# Patient Record
Sex: Female | Born: 1950
Health system: Southern US, Community
[De-identification: ages and names within clinical notes are randomized; demographics above are authoritative.]

## PROBLEM LIST (undated history)

## (undated) DIAGNOSIS — E662 Morbid (severe) obesity with alveolar hypoventilation: Secondary | ICD-10-CM

## (undated) DIAGNOSIS — M19049 Primary osteoarthritis, unspecified hand: Secondary | ICD-10-CM

## (undated) DIAGNOSIS — I4892 Unspecified atrial flutter: Secondary | ICD-10-CM

## (undated) DIAGNOSIS — Z5989 Other problems related to housing and economic circumstances: Secondary | ICD-10-CM

## (undated) DIAGNOSIS — E785 Hyperlipidemia, unspecified: Secondary | ICD-10-CM

## (undated) DIAGNOSIS — I5032 Chronic diastolic (congestive) heart failure: Secondary | ICD-10-CM

## (undated) DIAGNOSIS — Z5987 Material hardship due to limited financial resources, not elsewhere classified: Secondary | ICD-10-CM

## (undated) DIAGNOSIS — N950 Postmenopausal bleeding: Secondary | ICD-10-CM

## (undated) DIAGNOSIS — R519 Headache, unspecified: Secondary | ICD-10-CM

## (undated) DIAGNOSIS — E669 Obesity, unspecified: Secondary | ICD-10-CM

## (undated) DIAGNOSIS — Z598 Other problems related to housing and economic circumstances: Secondary | ICD-10-CM

## (undated) DIAGNOSIS — K219 Gastro-esophageal reflux disease without esophagitis: Secondary | ICD-10-CM

## (undated) DIAGNOSIS — R001 Bradycardia, unspecified: Secondary | ICD-10-CM

## (undated) DIAGNOSIS — K589 Irritable bowel syndrome without diarrhea: Secondary | ICD-10-CM

## (undated) DIAGNOSIS — IMO0001 Reserved for inherently not codable concepts without codable children: Secondary | ICD-10-CM

## (undated) DIAGNOSIS — I10 Essential (primary) hypertension: Secondary | ICD-10-CM

## (undated) DIAGNOSIS — R195 Other fecal abnormalities: Secondary | ICD-10-CM

## (undated) DIAGNOSIS — N184 Chronic kidney disease, stage 4 (severe): Secondary | ICD-10-CM

## (undated) DIAGNOSIS — R51 Headache: Secondary | ICD-10-CM

## (undated) HISTORY — PX: EYE SURGERY: SHX253

## (undated) HISTORY — PX: CHOLECYSTECTOMY: SHX55

## (undated) HISTORY — DX: Postmenopausal bleeding: N95.0

## (undated) HISTORY — DX: Other problems related to housing and economic circumstances: Z59.89

## (undated) HISTORY — PX: OTHER SURGICAL HISTORY: SHX169

## (undated) HISTORY — DX: Morbid (severe) obesity due to excess calories: E66.01

## (undated) HISTORY — DX: Bradycardia, unspecified: R00.1

## (undated) HISTORY — DX: Irritable bowel syndrome, unspecified: K58.9

## (undated) HISTORY — DX: Obesity, unspecified: E66.9

## (undated) HISTORY — DX: Other fecal abnormalities: R19.5

## (undated) HISTORY — DX: Chronic kidney disease, stage 4 (severe): N18.4

## (undated) HISTORY — DX: Hyperlipidemia, unspecified: E78.5

## (undated) HISTORY — DX: Material hardship due to limited financial resources, not elsewhere classified: Z59.87

## (undated) HISTORY — DX: Chronic diastolic (congestive) heart failure: I50.32

## (undated) HISTORY — DX: Other problems related to housing and economic circumstances: Z59.8

## (undated) HISTORY — DX: Morbid (severe) obesity with alveolar hypoventilation: E66.2

## (undated) HISTORY — DX: Primary osteoarthritis, unspecified hand: M19.049

## (undated) HISTORY — DX: Unspecified atrial flutter: I48.92

---

## 2007-11-05 ENCOUNTER — Inpatient Hospital Stay (HOSPITAL_COMMUNITY): Admission: EM | Admit: 2007-11-05 | Discharge: 2007-11-11 | Payer: Self-pay | Admitting: Family Medicine

## 2007-11-05 ENCOUNTER — Ambulatory Visit: Payer: Self-pay | Admitting: Internal Medicine

## 2007-11-05 DIAGNOSIS — A4902 Methicillin resistant Staphylococcus aureus infection, unspecified site: Secondary | ICD-10-CM

## 2007-11-06 DIAGNOSIS — E785 Hyperlipidemia, unspecified: Secondary | ICD-10-CM

## 2007-11-10 DIAGNOSIS — L0390 Cellulitis, unspecified: Secondary | ICD-10-CM

## 2007-11-10 DIAGNOSIS — E1169 Type 2 diabetes mellitus with other specified complication: Secondary | ICD-10-CM

## 2007-11-10 DIAGNOSIS — L0291 Cutaneous abscess, unspecified: Secondary | ICD-10-CM | POA: Insufficient documentation

## 2007-11-16 ENCOUNTER — Telehealth: Payer: Self-pay | Admitting: *Deleted

## 2007-11-28 ENCOUNTER — Encounter (INDEPENDENT_AMBULATORY_CARE_PROVIDER_SITE_OTHER): Payer: Self-pay | Admitting: *Deleted

## 2007-11-28 ENCOUNTER — Ambulatory Visit: Payer: Self-pay | Admitting: Infectious Diseases

## 2007-11-28 LAB — CONVERTED CEMR LAB
BUN: 10 mg/dL (ref 6–23)
Blood Glucose, Fingerstick: 356
CO2: 30 meq/L (ref 19–32)
Calcium: 9.4 mg/dL (ref 8.4–10.5)
Chloride: 103 meq/L (ref 96–112)
Creatinine, Ser: 0.62 mg/dL (ref 0.40–1.20)
Glucose, Bld: 251 mg/dL — ABNORMAL HIGH (ref 70–99)
Hgb A1c MFr Bld: 10.5 %
Potassium: 4.6 meq/L (ref 3.5–5.3)
Sodium: 137 meq/L (ref 135–145)

## 2007-12-05 ENCOUNTER — Encounter (INDEPENDENT_AMBULATORY_CARE_PROVIDER_SITE_OTHER): Payer: Self-pay | Admitting: *Deleted

## 2007-12-05 ENCOUNTER — Ambulatory Visit: Payer: Self-pay | Admitting: Internal Medicine

## 2007-12-05 LAB — CONVERTED CEMR LAB
Blood Glucose, Fingerstick: 160
Blood Glucose, Home Monitor: 3 mg/dL

## 2007-12-16 ENCOUNTER — Telehealth (INDEPENDENT_AMBULATORY_CARE_PROVIDER_SITE_OTHER): Payer: Self-pay | Admitting: Internal Medicine

## 2007-12-28 ENCOUNTER — Encounter (INDEPENDENT_AMBULATORY_CARE_PROVIDER_SITE_OTHER): Payer: Self-pay | Admitting: *Deleted

## 2007-12-28 ENCOUNTER — Encounter: Admission: RE | Admit: 2007-12-28 | Discharge: 2007-12-28 | Payer: Self-pay | Admitting: Internal Medicine

## 2008-01-04 ENCOUNTER — Encounter (INDEPENDENT_AMBULATORY_CARE_PROVIDER_SITE_OTHER): Payer: Self-pay | Admitting: *Deleted

## 2008-01-18 ENCOUNTER — Ambulatory Visit: Payer: Self-pay | Admitting: Internal Medicine

## 2008-01-18 ENCOUNTER — Encounter (INDEPENDENT_AMBULATORY_CARE_PROVIDER_SITE_OTHER): Payer: Self-pay | Admitting: *Deleted

## 2008-01-27 ENCOUNTER — Ambulatory Visit: Payer: Self-pay | Admitting: Internal Medicine

## 2008-01-27 ENCOUNTER — Encounter (INDEPENDENT_AMBULATORY_CARE_PROVIDER_SITE_OTHER): Payer: Self-pay | Admitting: *Deleted

## 2008-01-27 DIAGNOSIS — N950 Postmenopausal bleeding: Secondary | ICD-10-CM

## 2008-01-27 LAB — CONVERTED CEMR LAB
ALT: 13 units/L (ref 0–35)
AST: 13 units/L (ref 0–37)
Albumin: 3.6 g/dL (ref 3.5–5.2)
Alkaline Phosphatase: 74 units/L (ref 39–117)
BUN: 13 mg/dL (ref 6–23)
Blood Glucose, Fingerstick: 133
CO2: 21 meq/L (ref 19–32)
Calcium: 9 mg/dL (ref 8.4–10.5)
Chloride: 107 meq/L (ref 96–112)
Cholesterol: 132 mg/dL (ref 0–200)
Creatinine, Ser: 0.73 mg/dL (ref 0.40–1.20)
Glucose, Bld: 118 mg/dL — ABNORMAL HIGH (ref 70–99)
HDL: 41 mg/dL (ref >39)
Hgb A1c MFr Bld: 7.2 %
LDL Cholesterol: 78 mg/dL (ref 0–99)
Potassium: 4.3 meq/L (ref 3.5–5.3)
Sodium: 139 meq/L (ref 135–145)
Total Bilirubin: 0.5 mg/dL (ref 0.3–1.2)
Total CHOL/HDL Ratio: 3.2
Total Protein: 6.7 g/dL (ref 6.0–8.3)
Triglycerides: 67 mg/dL (ref <150)
VLDL: 13 mg/dL (ref 0–40)

## 2008-01-31 ENCOUNTER — Encounter (INDEPENDENT_AMBULATORY_CARE_PROVIDER_SITE_OTHER): Payer: Self-pay | Admitting: *Deleted

## 2008-03-01 ENCOUNTER — Telehealth (INDEPENDENT_AMBULATORY_CARE_PROVIDER_SITE_OTHER): Payer: Self-pay | Admitting: *Deleted

## 2008-03-08 ENCOUNTER — Ambulatory Visit: Payer: Self-pay | Admitting: *Deleted

## 2008-03-08 ENCOUNTER — Telehealth (INDEPENDENT_AMBULATORY_CARE_PROVIDER_SITE_OTHER): Payer: Self-pay | Admitting: *Deleted

## 2008-03-08 LAB — CONVERTED CEMR LAB

## 2008-03-16 ENCOUNTER — Ambulatory Visit: Payer: Self-pay | Admitting: Obstetrics & Gynecology

## 2008-03-16 ENCOUNTER — Other Ambulatory Visit: Admission: RE | Admit: 2008-03-16 | Discharge: 2008-03-16 | Payer: Self-pay | Admitting: Obstetrics & Gynecology

## 2008-04-02 ENCOUNTER — Encounter: Admission: RE | Admit: 2008-04-02 | Discharge: 2008-04-02 | Payer: Self-pay | Admitting: Obstetrics & Gynecology

## 2008-05-07 ENCOUNTER — Encounter (INDEPENDENT_AMBULATORY_CARE_PROVIDER_SITE_OTHER): Payer: Self-pay | Admitting: *Deleted

## 2008-05-07 ENCOUNTER — Ambulatory Visit: Payer: Self-pay | Admitting: Internal Medicine

## 2008-05-07 LAB — CONVERTED CEMR LAB: Blood Glucose, Fingerstick: 107

## 2008-05-09 ENCOUNTER — Encounter (INDEPENDENT_AMBULATORY_CARE_PROVIDER_SITE_OTHER): Payer: Self-pay | Admitting: *Deleted

## 2008-05-11 ENCOUNTER — Ambulatory Visit: Payer: Self-pay | Admitting: Obstetrics and Gynecology

## 2008-05-16 ENCOUNTER — Ambulatory Visit (HOSPITAL_COMMUNITY): Admission: RE | Admit: 2008-05-16 | Discharge: 2008-05-16 | Payer: Self-pay | Admitting: Internal Medicine

## 2008-05-16 ENCOUNTER — Encounter (INDEPENDENT_AMBULATORY_CARE_PROVIDER_SITE_OTHER): Payer: Self-pay | Admitting: *Deleted

## 2008-05-16 ENCOUNTER — Ambulatory Visit: Payer: Self-pay | Admitting: Internal Medicine

## 2008-05-16 LAB — CONVERTED CEMR LAB
Blood Glucose, Fingerstick: 139
Hgb A1c MFr Bld: 5.8 %

## 2008-08-15 ENCOUNTER — Telehealth: Payer: Self-pay | Admitting: *Deleted

## 2008-08-31 ENCOUNTER — Ambulatory Visit: Payer: Self-pay | Admitting: Obstetrics & Gynecology

## 2008-08-31 ENCOUNTER — Encounter: Payer: Self-pay | Admitting: Obstetrics & Gynecology

## 2008-09-19 ENCOUNTER — Encounter (INDEPENDENT_AMBULATORY_CARE_PROVIDER_SITE_OTHER): Payer: Self-pay | Admitting: *Deleted

## 2008-09-19 LAB — HM DIABETES EYE EXAM

## 2008-10-26 ENCOUNTER — Telehealth: Payer: Self-pay | Admitting: Infectious Diseases

## 2008-10-30 ENCOUNTER — Telehealth (INDEPENDENT_AMBULATORY_CARE_PROVIDER_SITE_OTHER): Payer: Self-pay | Admitting: *Deleted

## 2008-11-13 ENCOUNTER — Ambulatory Visit: Payer: Self-pay | Admitting: Internal Medicine

## 2008-11-13 ENCOUNTER — Encounter (INDEPENDENT_AMBULATORY_CARE_PROVIDER_SITE_OTHER): Payer: Self-pay | Admitting: Internal Medicine

## 2008-11-13 DIAGNOSIS — E669 Obesity, unspecified: Secondary | ICD-10-CM

## 2008-11-13 LAB — CONVERTED CEMR LAB
Cholesterol, target level: 200 mg/dL
Creatinine, Urine: 82 mg/dL
HDL goal, serum: 40 mg/dL
Hgb A1c MFr Bld: 6.1 %
LDL Goal: 100 mg/dL
Microalb Creat Ratio: 7.7 mg/g (ref 0.0–30.0)
Microalb, Ur: 0.63 mg/dL (ref 0.00–1.89)

## 2008-11-20 ENCOUNTER — Ambulatory Visit: Payer: Self-pay | Admitting: Internal Medicine

## 2008-11-20 ENCOUNTER — Encounter (INDEPENDENT_AMBULATORY_CARE_PROVIDER_SITE_OTHER): Payer: Self-pay | Admitting: Internal Medicine

## 2008-11-20 DIAGNOSIS — K921 Melena: Secondary | ICD-10-CM | POA: Insufficient documentation

## 2008-11-20 LAB — CONVERTED CEMR LAB
ALT: 9 units/L (ref 0–35)
AST: 11 units/L (ref 0–37)
Albumin: 3.4 g/dL — ABNORMAL LOW (ref 3.5–5.2)
Alkaline Phosphatase: 78 units/L (ref 39–117)
BUN: 15 mg/dL (ref 6–23)
CO2: 23 meq/L (ref 19–32)
Calcium: 9.3 mg/dL (ref 8.4–10.5)
Chloride: 108 meq/L (ref 96–112)
Cholesterol: 140 mg/dL (ref 0–200)
Creatinine, Ser: 0.84 mg/dL (ref 0.40–1.20)
Glucose, Bld: 109 mg/dL — ABNORMAL HIGH (ref 70–99)
HDL: 39 mg/dL — ABNORMAL LOW (ref >39)
LDL Cholesterol: 80 mg/dL (ref 0–99)
OCCULT 1: NEGATIVE
OCCULT 2: POSITIVE
OCCULT 3: POSITIVE
Potassium: 4.6 meq/L (ref 3.5–5.3)
Sodium: 140 meq/L (ref 135–145)
Total Bilirubin: 0.3 mg/dL (ref 0.3–1.2)
Total CHOL/HDL Ratio: 3.6
Total Protein: 6.7 g/dL (ref 6.0–8.3)
Triglycerides: 104 mg/dL (ref <150)
VLDL: 21 mg/dL (ref 0–40)

## 2008-11-20 LAB — FECAL OCCULT BLOOD, GUAIAC: Fecal Occult Blood: POSITIVE

## 2008-11-21 ENCOUNTER — Ambulatory Visit: Payer: Self-pay | Admitting: Family Medicine

## 2008-11-21 DIAGNOSIS — M19049 Primary osteoarthritis, unspecified hand: Secondary | ICD-10-CM | POA: Insufficient documentation

## 2008-11-22 ENCOUNTER — Telehealth: Payer: Self-pay | Admitting: Sports Medicine

## 2008-12-19 ENCOUNTER — Ambulatory Visit: Payer: Self-pay | Admitting: Sports Medicine

## 2008-12-19 ENCOUNTER — Telehealth (INDEPENDENT_AMBULATORY_CARE_PROVIDER_SITE_OTHER): Payer: Self-pay | Admitting: Internal Medicine

## 2008-12-21 ENCOUNTER — Encounter: Admission: RE | Admit: 2008-12-21 | Discharge: 2008-12-21 | Payer: Self-pay | Admitting: Internal Medicine

## 2008-12-26 ENCOUNTER — Ambulatory Visit: Payer: Self-pay | Admitting: Gastroenterology

## 2008-12-26 DIAGNOSIS — R109 Unspecified abdominal pain: Secondary | ICD-10-CM

## 2008-12-26 DIAGNOSIS — R197 Diarrhea, unspecified: Secondary | ICD-10-CM

## 2008-12-26 DIAGNOSIS — R195 Other fecal abnormalities: Secondary | ICD-10-CM

## 2009-01-11 ENCOUNTER — Encounter: Payer: Self-pay | Admitting: Gastroenterology

## 2009-01-11 ENCOUNTER — Ambulatory Visit: Payer: Self-pay | Admitting: Gastroenterology

## 2009-01-11 LAB — HM COLONOSCOPY

## 2009-01-16 ENCOUNTER — Encounter: Payer: Self-pay | Admitting: Gastroenterology

## 2009-02-07 ENCOUNTER — Telehealth: Payer: Self-pay | Admitting: Internal Medicine

## 2009-02-11 ENCOUNTER — Ambulatory Visit: Payer: Self-pay | Admitting: Internal Medicine

## 2009-02-11 LAB — CONVERTED CEMR LAB
Blood Glucose, Fingerstick: 112
Hgb A1c MFr Bld: 5.9 %

## 2009-04-04 ENCOUNTER — Telehealth: Payer: Self-pay | Admitting: Internal Medicine

## 2009-07-05 ENCOUNTER — Encounter (INDEPENDENT_AMBULATORY_CARE_PROVIDER_SITE_OTHER): Payer: Self-pay | Admitting: Internal Medicine

## 2009-07-08 ENCOUNTER — Ambulatory Visit: Payer: Self-pay | Admitting: Infectious Diseases

## 2009-07-08 ENCOUNTER — Ambulatory Visit: Payer: Self-pay | Admitting: Internal Medicine

## 2009-07-08 LAB — CONVERTED CEMR LAB: Blood Glucose, Home Monitor: 2 mg/dL

## 2009-07-16 ENCOUNTER — Telehealth: Payer: Self-pay | Admitting: Licensed Clinical Social Worker

## 2009-11-21 ENCOUNTER — Ambulatory Visit: Payer: Self-pay | Admitting: Internal Medicine

## 2009-11-21 LAB — CONVERTED CEMR LAB
Blood Glucose, Fingerstick: 128
Cholesterol: 129 mg/dL (ref 0–200)
HDL: 34 mg/dL — ABNORMAL LOW (ref >39)
Hgb A1c MFr Bld: 5.8 %
LDL Cholesterol: 77 mg/dL (ref 0–99)
Total CHOL/HDL Ratio: 3.8
Triglycerides: 91 mg/dL (ref <150)
VLDL: 18 mg/dL (ref 0–40)

## 2009-12-24 ENCOUNTER — Encounter: Admission: RE | Admit: 2009-12-24 | Discharge: 2009-12-24 | Payer: Self-pay | Admitting: Internal Medicine

## 2009-12-25 ENCOUNTER — Encounter: Payer: Self-pay | Admitting: Sports Medicine

## 2010-01-30 ENCOUNTER — Ambulatory Visit: Payer: Self-pay | Admitting: Internal Medicine

## 2010-01-30 LAB — CONVERTED CEMR LAB
BUN: 14 mg/dL (ref 6–23)
Blood Glucose, AC Bkfst: 72 mg/dL
CO2: 27 meq/L (ref 19–32)
Calcium: 9.2 mg/dL (ref 8.4–10.5)
Chloride: 106 meq/L (ref 96–112)
Creatinine, Ser: 0.81 mg/dL (ref 0.40–1.20)
Glucose, Bld: 63 mg/dL — ABNORMAL LOW (ref 70–99)
Hgb A1c MFr Bld: 6 %
Potassium: 3.9 meq/L (ref 3.5–5.3)
Sodium: 140 meq/L (ref 135–145)

## 2010-05-05 ENCOUNTER — Telehealth (INDEPENDENT_AMBULATORY_CARE_PROVIDER_SITE_OTHER): Payer: Self-pay | Admitting: *Deleted

## 2010-05-08 ENCOUNTER — Ambulatory Visit: Admission: RE | Admit: 2010-05-08 | Discharge: 2010-05-08 | Payer: Self-pay | Source: Home / Self Care

## 2010-05-08 DIAGNOSIS — K589 Irritable bowel syndrome without diarrhea: Secondary | ICD-10-CM | POA: Insufficient documentation

## 2010-05-08 LAB — CONVERTED CEMR LAB
Blood Glucose, AC Bkfst: 124 mg/dL
Creatinine, Urine: 100.3 mg/dL
Hgb A1c MFr Bld: 5.9 %
Microalb Creat Ratio: 20.9 mg/g (ref 0.0–30.0)
Microalb, Ur: 2.1 mg/dL — ABNORMAL HIGH (ref 0.00–1.89)

## 2010-05-12 LAB — GLUCOSE, CAPILLARY: Glucose-Capillary: 124 mg/dL — ABNORMAL HIGH (ref 70–99)

## 2010-05-15 ENCOUNTER — Telehealth: Payer: Self-pay | Admitting: *Deleted

## 2010-05-22 ENCOUNTER — Ambulatory Visit: Admission: RE | Admit: 2010-05-22 | Discharge: 2010-05-22 | Payer: Self-pay | Source: Home / Self Care

## 2010-05-22 LAB — GLUCOSE, CAPILLARY: Glucose-Capillary: 95 mg/dL (ref 70–99)

## 2010-05-22 LAB — CONVERTED CEMR LAB
BUN: 26 mg/dL — ABNORMAL HIGH (ref 6–23)
Blood Glucose, Fingerstick: 95
CO2: 23 meq/L (ref 19–32)
Calcium: 9.1 mg/dL (ref 8.4–10.5)
Chloride: 103 meq/L (ref 96–112)
Creatinine, Ser: 0.94 mg/dL (ref 0.40–1.20)
Glucose, Bld: 93 mg/dL (ref 70–99)
Potassium: 4.3 meq/L (ref 3.5–5.3)
Sodium: 139 meq/L (ref 135–145)

## 2010-05-27 NOTE — Progress Notes (Signed)
Summary: Soc. Work/Refer at Next Visit  Phone Note Outgoing Call   Call placed by: Soc. Work Call placed to: Patient Summary of Call: Called patient in response to PCP referral so as to set up an appointment with the patient for assessment.  The patient tells me her transportation is very limited to when her sister and daughter can take her and they both work.  The patient tells me that she recd unemployment for two years and it ended in August. She has worked mainly in Loss adjuster, chartered over the years.  The patient reported to me that she had mailed outstanding items to [facility] for clinic discount and [facility].  Patient tells me that her sister has been helping her with copays for her medications and that the patient is linked with MAP program.   The patient is noncommittal about whether she wants to come and meet with me primarily because of her lack of transportation.  She said she will call me if she can get a ride.   I offered to send her resource information re: jobs and other support programs and she was receptive to that option.

## 2010-05-27 NOTE — Letter (Signed)
Summary: MeterDownLoad  MeterDownLoad   Imported By: Bonner Puna 01/31/2008 16:10:09  _____________________________________________________________________  External Attachment:    Type:   Image     Comment:   External Document

## 2010-05-27 NOTE — Assessment & Plan Note (Signed)
Summary: FU VISIT/DS   Vital Signs:  Patient profile:   60 year old female Height:      60 inches (152.40 cm) Weight:      263.0 pounds (119.55 kg) BMI:     51.55 Temp:     97.4 degrees F oral Pulse rate:   65 / minute BP sitting:   141 / 76  (left arm) Cuff size:   regular-left lower arm  Vitals Entered By: Morrison Old RN (January 30, 2010 1:42 PM) CC: F/U visit.  c/o sharp abd. pain w/diarrhea esp.after eating. Is Patient Diabetic? Yes Did you bring your meter with you today? Yes Pain Assessment Patient in pain? yes     Location: abdomen Intensity: 3 Type: aching Onset of pain  Intermittent Nutritional Status BMI of > 30 = obese  Have you ever been in a relationship where you felt threatened, hurt or afraid?No   Does patient need assistance? Functional Status Self care Ambulation Normal Comments Pt. has new meter, Presto, unable to download meter.   Primary Care Provider:  Luvenia Starch, MD  CC:  F/U visit.  c/o sharp abd. pain w/diarrhea esp.after eating.Marland Kitchen  History of Present Illness: Ms. Bartol is a 60 year old woman with pmh significant for DM II, IBS, HLD, and degenerative joint disease presents to [facility] for general check-up and follow up regarding diabetes.  1) IBS - feels like she has having a flare up. She states she is having loose stools, 10stools/day, watery consistency, no blood, no foul smell, no blood. Patient states this is similar to all her previous IBS flares. She states she has run out of her hyosyamine and needs to pick up her prescription.   2) DM - Her CBGs have been running  between 115-170. No episodes of hypoglycemia.   Have been exercising more, and states she feels good. No new complaints or concerns.   Depression History:      The patient denies a depressed mood most of the day and a diminished interest in her usual daily activities.         Preventive Screening-Counseling & Management  Alcohol-Tobacco     Alcohol drinks/day: 0  Smoking Status: never  Caffeine-Diet-Exercise     Diet Counseling: to improve diet; diet is suboptimal     Does Patient Exercise: yes     Type of exercise: treadmill/walking     Exercise (avg: min/session): 30-60     Times/week: 5     Exercise Counseling: to improve exercise regimen  Current Medications (verified): 1)  Metformin Hcl 1000 Mg  Tabs (Metformin Hcl) .... Take 1 Tablet By Mouth Two Times A Day 2)  Lantus 100 Unit/ml  Soln (Insulin Glargine) .... Inject 60 Units Subcutaneously At Bedtime 3)  Truetrack Test  Strp (Glucose Blood) .... Use To Test Blood Glucose 2x- 3x/day At Least After Lunch and Dinner 4)  Lancets 30g  Misc (Lancets) .... Use To Test Blood Glucose 2-3x Daily 5)  Insulin Syringe 31g X 5/16" 1 Ml Misc (Insulin Syringe-Needle U-100) .... Use To Inject Insulin Once Daily 6)  Hyoscyamine Sulfate 0.125 Mg  Subl (Hyoscyamine Sulfate) .... One Tab Twice Per Day As Directed  Allergies (verified): 1)  ! Doxycycline  Past History:  Past Medical History: Last updated: 11/28/2007 DIABETES MELLITUS - II    - dx'd 10/2007, A1c: 12.4% MRSA ABSCESSES    - R scapula + LUE (10/2007) both requiring surgical I+D OBESITY DYSLIPIDEMIA    - FLP 11/06/07: TC  102, TG 105, HDL <10, unable to calculate LDL  Past Surgical History: Last updated: 12/26/2008 Cholecystectomy  Family History: Last updated: 12/26/2008 Family History Diabetes 1st degree relative: Mother Mother with hx of obesity, diabetes and amputations. Died from complications of amputation. Mother was  ~59 yrs old when died. Family History of Stroke M 1st degree relative <50 Father died of stroke about 28 yrs of age.  No FH of Colon Cancer:  Social History: Last updated: 11/21/2009 Single. Divorced x2. Has 2 children, by two different fathers. 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 May apply for disability/SSI given her pain in her  hands which has made interviewing for jobs difficult. Never Smoked Alcohol use-no Drug use-no Regular exercise-yes  Risk Factors: Alcohol Use: 0 (01/30/2010) Exercise: yes (01/30/2010)  Risk Factors: Smoking Status: never (01/30/2010)  Review of Systems GI:  Complains of abdominal pain and diarrhea; denies nausea and vomiting. GU:  Denies dysuria and urinary frequency. MS:  Complains of stiffness.  Physical Exam  General:  alert and well-developed.   Head:  normocephalic and atraumatic.   Mouth:  good dentition.   Neck:  supple.   Lungs:  normal respiratory effort and normal breath sounds.   Heart:  normal rate and regular rhythm.   Abdomen:  soft, non-tender, normal bowel sounds, no distention, no masses, no guarding, no rigidity, and no rebound tenderness.   Msk:  normal ROM.   Extremities:  no edema  Neurologic:  alert & oriented X3.     Impression & Recommendations:  Problem # 1:  DIARRHEA (ICD-787.91) Assessment Deteriorated  Secondary to IBS flare. Differential includes infectious vs  colonic. I do not believe it is infectious as patient states her symptoms are exactly like her previous episodes. Pt has also been afebrile, and denies sick contacts. Patient has not had any stool studies in the past. Patient has been evaluated by GI, who performed a colonoscopy in 12/2008. There was no evidence of IBD, or malignancy. Would consider stool studies if symptoms persist in future. Plan: BMet today to check electrolytes -Advised patient to refrain from fried foods as that can exacerbate symptoms and she does admit to eating foods that are fried.   Orders: T-Basic Metabolic Panel ([id number])  Problem # 2:  DIABETES MELLITUS, TYPE II (ICD-250.00) Assessment: Unchanged A1C 6.0. Slightly above previous reading of 5.8. In looking at her meter and CBGs, her blood sugars are only slightly above what she is normally running. Furthermore, her AM CBG was in the low 100s, thus I'm  not concerned about increasing her medications at this time.  Continue current regimen.  Her updated medication list for this problem includes:    Metformin Hcl 1000 Mg Tabs (Metformin hcl) .Marland Kitchen... Take 1 tablet by mouth two times a day    Lantus 100 Unit/ml Soln (Insulin glargine) ..... Inject 60 units subcutaneously at bedtime  Orders: T- Capillary Blood Glucose ([id number]) T-Hgb A1C (in-house) [id number])  Labs Reviewed: Creat: 0.84 (11/20/2008)     Last Eye Exam: Mild non-proliferative diabetic retinopathy.   Visual acuity OD (best corrected): 20/40 Visual acuity OS (best corrected): 20/60 Intraocular pressure OD: 18 Intraocular pressure OS: 19  (09/19/2008) Reviewed HgBA1c results: 6.0 (01/30/2010)  5.8 (11/21/2009)  Problem # 3:  DYSLIPIDEMIA (ICD-272.4) Assessment: Improved Continue to follow anually.  Labs Reviewed: SGOT: 11 (11/20/2008)   SGPT: 9 (11/20/2008)  Lipid Goals: Chol Goal: 200 (11/13/2008)   HDL  Goal: 40 (11/13/2008)   LDL Goal: 100 (11/13/2008)   TG Goal: 150 (11/13/2008)  Prior 10 Yr Risk Heart Disease: 8 % (11/13/2008)   HDL:34 (11/21/2009), 39 (11/20/2008)  LDL:77 (11/21/2009), 80 (11/20/2008)  Chol:129 (11/21/2009), 140 (11/20/2008)  Trig:91 (11/21/2009), 104 (11/20/2008)  Problem # 4:  OBESITY (ICD-278.00) Weight gain of 8 lbs since last visit in July 2011. Likely secondary to decreased level of activity as patient mentions she is not exercising anymore due to stiffness in her joints sometimes. I advised patient that water aerobics may be a good option for her as it is non weight bearing.   Ht: 60 (01/30/2010)   Wt: 263.0 (01/30/2010)   BMI: 51.55 (01/30/2010)  Complete Medication List: 1)  Metformin Hcl 1000 Mg Tabs (Metformin hcl) .... Take 1 tablet by mouth two times a day 2)  Lantus 100 Unit/ml Soln (Insulin glargine) .... Inject 60 units subcutaneously at bedtime 3)  Truetrack Test Strp (Glucose blood) .... Use to test blood glucose 2x- 3x/day at  least after lunch and dinner 4)  Lancets 30g Misc (Lancets) .... Use to test blood glucose 2-3x daily 5)  Insulin Syringe 31g X 5/16" 1 Ml Misc (Insulin syringe-needle u-100) .... Use to inject insulin once daily 6)  Hyoscyamine Sulfate 0.125 Mg Subl (Hyoscyamine sulfate) .... Take 1 tab every 4 hours as needed for stomach spasms.  Other Orders: Influenza Vaccine NON MCR [id number])  Patient Instructions: 1)  Please schedule a follow-up appointment in 3 months. 2)  Please take all medications as directed.  3)  Please call clinic if diarrhea worsens.  Prescriptions: HYOSCYAMINE SULFATE 0.125 MG  SUBL (HYOSCYAMINE SULFATE) Take 1 tab every 4 hours as needed for stomach spasms.  #90 x 5   Entered and Authorized by:   Jolene Provost MD   Signed by:   Jolene Provost MD on 01/30/2010   Method used:   Print then Give to Patient   RxID:   [id number]   Prevention & Chronic Care Immunizations   Influenza vaccine: Fluvax Non-MCR  (01/30/2010)   Influenza vaccine deferral: Deferred  (01/30/2010)    Tetanus booster: 07/08/2009: Tdap    Pneumococcal vaccine: Not documented  Colorectal Screening   Hemoccult: Not documented   Hemoccult action/deferral: Ordered  (11/13/2008)    Colonoscopy: Location:  [place].    (01/11/2009)   Colonoscopy action/deferral: Refused  (11/13/2008)   Colonoscopy due: 01/2019  Other Screening   Pap smear: NEGATIVE FOR INTRAEPITHELIAL LESIONS OR MALIGNANCY.  (08/31/2008)   Pap smear action/deferral: Deferred  (01/30/2010)    Mammogram: ASSESSMENT: Negative - BI-RADS 1^MM DIGITAL SCREENING  (12/24/2009)   Mammogram action/deferral: Ordered  (11/21/2009)   Smoking status: never  (01/30/2010)  Diabetes Mellitus   HgbA1C: 6.0  (01/30/2010)   HgbA1C action/deferral: Ordered  (02/11/2009)   Hemoglobin A1C due: 07/09/2010    Eye exam: Mild non-proliferative diabetic retinopathy.   Visual acuity OD (best corrected): 20/40 Visual acuity OS  (best corrected): 20/60 Intraocular pressure OD: 18 Intraocular pressure OS: 19   (09/19/2008)    Foot exam: yes  (11/21/2009)   Foot exam action/deferral: Do today   High risk foot: Yes  (11/13/2008)   Foot care education: Done  (11/21/2009)   Foot exam due: 10/08/2009    Urine microalbumin/creatinine ratio: 7.7  (11/13/2008)   Urine microalbumin action/deferral: Ordered    Diabetes flowsheet reviewed?: Yes   Progress toward A1C goal: At goal  Lipids   Total Cholesterol: 129  (11/21/2009)   Lipid  panel action/deferral: Lipid Panel ordered   LDL: 77  (11/21/2009)   LDL Direct: Not documented   HDL: 34  (11/21/2009)   Triglycerides: 91  (11/21/2009)    SGOT (AST): 11  (11/20/2008)   SGPT (ALT): 9  (11/20/2008)   Alkaline phosphatase: 78  (11/20/2008)   Total bilirubin: 0.3  (11/20/2008)    Lipid flowsheet reviewed?: Yes   Progress toward LDL goal: Improved  Self-Management Support :   Personal Goals (by the next clinic visit) :     Personal A1C goal: 6  (01/30/2010)     Personal blood pressure goal: 130/80  (01/30/2010)     Personal LDL goal: 70  (01/30/2010)    Patient will work on the following items until the next clinic visit to reach self-care goals:     Medications and monitoring: take my medicines every day, bring all of my medications to every visit, examine my feet every day  (01/30/2010)     Eating: eat more vegetables, use fresh or frozen vegetables, eat baked foods instead of fried foods  (01/30/2010)     Activity: take a 30 minute walk every day  (01/30/2010)     Other: smaller proportions of food, walking and using treadmill  (07/08/2009)    Diabetes self-management support: Written self-care plan, Pre-printed educational material  (01/30/2010)   Diabetes care plan printed   Last diabetes self-management training by diabetes educator: 07/08/2009   Last medical nutrition therapy: 03/08/2008    Lipid self-management support: Written self-care plan   (01/30/2010)   Lipid self-care plan printed.  Process Orders Check Orders Results:     [facility]: [id number] not required for this insurance Tests Sent for requisitioning (January 30, 2010 5:42 PM):     01/30/2010: [facility] -- T-Basic Metabolic Panel 0000000 (signed)     Laboratory Results   Blood Tests   Date/Time Received: January 30, 2010 2:06 PM  Date/Time Reported: Lenoria Farrier  January 30, 2010 2:06 PM   HGBA1C: 6.0%   (Normal Range: Non-Diabetic - 3-6%   Control Diabetic - 6-8%) CBG Fasting:: 72mg /dL       Influenza Vaccine    Vaccine Type: Fluvax Non-MCR    Site: left deltoid    Mfr: GlaxoSmithKline    Dose: 0.5 ml    Route: IM    Given by: Morrison Old RN    Exp. Date: 10/25/2010    Lot #: [id number]    VIS given: 11/19/09 version given January 30, 2010.  Flu Vaccine Consent Questions    Do you have a history of severe allergic reactions to this vaccine? no    Any prior history of allergic reactions to egg and/or gelatin? no    Do you have a sensitivity to the preservative Thimersol? no    Do you have a past history of Guillan-Barre Syndrome? no    Do you currently have an acute febrile illness? no    Have you ever had a severe reaction to latex? no    Vaccine information given and explained to patient? yes    Are you currently pregnant? no

## 2010-05-27 NOTE — Assessment & Plan Note (Signed)
Summary: diabetes teaching/cfb  Is Patient Diabetic? Yes Did you bring your meter with you today? Yes Comments cassia acutifolia 100% tkaing this supplement   Allergies: 1)  ! Doxycycline   Complete Medication List: 1)  Metformin Hcl 1000 Mg Tabs (Metformin hcl) .... Take 1 tablet by mouth two times a day 2)  Lantus 100 Unit/ml Soln (Insulin glargine) .... Inject 65 units subcutaneously at bedtime 3)  Truetrack Test Strp (Glucose blood) .... Use to test blood glucose 2x- 3x/day at least after lunch and dinner 4)  Lancets 30g Misc (Lancets) .... Use to test blood glucose 2-3x daily 5)  Insulin Syringe 31g X 5/16" 1 Ml Misc (Insulin syringe-needle u-100) .... Use to inject insulin once daily 6)  Voltaren 1 % Gel (Diclofenac sodium) .... Use 1/2 gram qid to rt thumb 7)  Hyoscyamine Sulfate 0.125 Mg Subl (Hyoscyamine sulfate) .... One tab twice per day as directed  Other Orders: T-Hgb A1C (in-house) [id number]) DSMT(Medicare) Individual, 30 Minutes [id number])  Diabetes Self Management Training  PCP: Luvenia Starch MD Referring MD: Venia Carbon Date diagnosed with diabetes: 11/05/2007 Diabetes Type: Type 2 Diabetes Mellitus Other persons present: no Current smoking Status: never  Assessment Daily activities: cleans, sews,crossword puzzles, TV < 1 hour/day, 05/07/08- reports that she is now walking 3x daily Sources of Support: sister and family, greesn low fat, baked foods Special needs or Barriers: weak and no energy biggest complaint  Coping with Diabetes Would you say you are: happy What bothers you  most about dealing with your diabetes? doesn't bother her How does diabetes make your life hard? trying to control it  Diabetes Medications:  Lipid lowering Meds? No Anti-platelet Meds? No Herbs or Supplements: No Comments: see log book copies- has had shakes nad headaches hat went away with eating that she hasn't recorded in book. trying to decrease weight- with very well controlled  diabetes and excellent record keeping suggest consideration of smll decrease in insulin dose to 60- 63 units a day. Discussed wihtpatient indeication for lowering insulin is excess low blood sugars.   Long Acting  Bedtime Dose: 65 units    Monitoring Self monitoring blood glucose 2 times a day Name of Meter  True Track Measures urine ketones? No  Recent Episodes of: Requiring Help from another person  Hyperglycemia : Yes Hypoglycemia: Yes Severe Hypoglycemia : No   Wears Medical I.D. No Carrys Food for Low Blood sugar Yes Can you tell if your blood sugar is low? Yes   Estimated /Usual Carb Intake Breakfast # of Carbs/Grams nothing, cup of yogurt or 1/2 banana Lunch # of Carbs/Grams salads and grilled cheese, tuna and salmon Midafternoon # of Carbs/Grams ritz crackers Dinner # of Carbs/Grams baked and bolied meats, salads Bedtime # of Carbs/Grams doesn't eat after 6 pm anymore  Activity Limitations  Appropriate physical activity Would you  say you are physically active: Yes  Type of physical activity  Walk Length of time: 47min # of times per week: 7 Diabetes Disease Process  Discussed today Define diabetes in simple terms: Demonstrates competency Medications  Nutritional Management State changes planned for home meals/snacks: Needs review/assistance    Monitoring State purpose and frequency of monitoring BG-ketones-HgbA1C  : Demonstrates competencyPerform glucose monitoring/ketone testing and record results correctly: Demonstrates competencyState target blood glucose and HgbA1C goals: Demonstrates competency Complications State the causes-signs and symptoms and prevention of Hyperglycemia: Demonstrates competency   Explain proper treatment of hyperglycemia: Demonstrates competency   State the causes- signs and symptoms and prevention of  hypoglycemia: Demonstrates competencyExplain proper treatment of hypoglycemia: Demonstrates competency     Exercise  Lifestyle changes:Goal setting and Problem solving Identify risk factors that interfere with health: Needs review/assistance   Develop strategies to reduce risk factors: Needs review/assistance   Verbalize need for and frequency of health care follow-up: Demonstrates competency   Diabetes Management Education Done: 07/08/2009        continues to struggle with weight- says she thinks she eats more when she gets discouraged. tried to help her plan a strategy to CBgs and a1C acceptable, unfortunately weight  up significantly.  change this behavior, but she is limited to what she can do by her lack of transportaiton. Introduced her to Education officer, museum nad gave her her card to call because [place] also feels that getting a job would help her feel less discouraged.  Diabetes Self Management Support: sister/niece and office staff follow up :6 months or earlier as needed

## 2010-05-27 NOTE — Letter (Signed)
Summary: Disability Determaination Services  Disability Determaination Services   Imported By: Tobin Chad 12/31/2009 09:41:41  _____________________________________________________________________  External Attachment:    Type:   Image     Comment:   External Document

## 2010-05-27 NOTE — Assessment & Plan Note (Signed)
Summary: FU VISIT/DS   Vital Signs:  Patient profile:   60 year old female Height:      60 inches (152.40 cm) Weight:      255.3 pounds (116.05 kg) BMI:     50.04 Temp:     97.8 degrees F (36.56 degrees C) oral Pulse rate:   67 / minute BP sitting:   160 / 77  (right arm)  Vitals Entered By: Morrison Old RN (November 21, 2009 1:44 PM) CC: F/U visit. Med. refills. Is Patient Diabetic? Yes Did you bring your meter with you today? Yes Pain Assessment Patient in pain? no      Nutritional Status BMI of > 30 = obese CBG Result 128  Have you ever been in a relationship where you felt threatened, hurt or afraid?No   Does patient need assistance? Functional Status Self care Ambulation Impaired:Risk for fall Comments Pt.has not tested her blood sugars since June b/c [facility] has not had any test strips available per pt.   Diabetic Foot Exam Last Podiatry Exam Date: 11/21/2009  Foot Inspection Is there a history of a foot ulcer?              Yes Is there a foot ulcer now?              No Can the patient see the bottom of their feet?          No Are the shoes appropriate in style and fit?          Yes Is there swelling or an abnormal foot shape?          No Are the toenails long?                No Are the toenails thick?                No Are the toenails ingrown?              No Is there heavy callous build-up?              No Is there pain in the calf muscle (Intermittent claudication) when walking?    No Diabetic Foot Care Education Patient educated on appropriate care of diabetic feet.  Pulse Check          Right Foot          Left Foot Dorsalis Pedis:        normal            normal Comments: Dry, peeling skin on bottom ofboth feet. Decreased sensation #4,#5,#6 on monofilament test.   10-g (5.07) Semmes-Weinstein Monofilament Test Performed by: Morrison Old RN          Right Foot          Left Foot Visual Inspection     normal         normal Test Control      normal          normal Site 1         normal         normal Site 2         normal         normal Site 3         normal         normal Site 4         normal         normal Site 5  normal         normal Site 6         normal         normal Site 7         normal         normal Site 8         normal         normal Site 9         normal         normal  Impression      normal         normal   Primary Care Provider:  Luvenia Starch, MD  CC:  F/U visit. Med. refills..  History of Present Illness: Ms. Ginder is a 60 year old woman with pmh significant for DM II, IBS, HLD, and degenerative joint disease presents to [facility] for general check-up and follow up regarding diabetes.  1) IBS - recently had flare-up with diarrhea, now resolved.  2) DM - reports she ran out of test strips in June, and was unable to check her sugars in July. Her CBGs have been running  between 90-150. No episodes of hypoglycemia.   Have been exercising more, and states she feels good. No new complaints or concerns.   Depression History:      The patient denies a depressed mood most of the day and a diminished interest in her usual daily activities.         Preventive Screening-Counseling & Management  Alcohol-Tobacco     Alcohol drinks/day: 0     Smoking Status: never  Caffeine-Diet-Exercise     Diet Counseling: to improve diet; diet is suboptimal     Does Patient Exercise: yes     Type of exercise: treadmill     Exercise (avg: min/session): 30-60     Times/week: 5  Current Medications (verified): 1)  Metformin Hcl 1000 Mg  Tabs (Metformin Hcl) .... Take 1 Tablet By Mouth Two Times A Day 2)  Lantus 100 Unit/ml  Soln (Insulin Glargine) .... Inject 60 Units Subcutaneously At Bedtime 3)  Truetrack Test  Strp (Glucose Blood) .... Use To Test Blood Glucose 2x- 3x/day At Least After Lunch and Dinner 4)  Lancets 30g  Misc (Lancets) .... Use To Test Blood Glucose 2-3x Daily 5)  Insulin Syringe 31g X 5/16" 1 Ml Misc (Insulin  Syringe-Needle U-100) .... Use To Inject Insulin Once Daily 6)  Hyoscyamine Sulfate 0.125 Mg  Subl (Hyoscyamine Sulfate) .... One Tab Twice Per Day As Directed  Allergies (verified): 1)  ! Doxycycline  Past History:  Past Medical History: Last updated: 11/28/2007 DIABETES MELLITUS - II    - dx'd 10/2007, A1c: 12.4% MRSA ABSCESSES    - R scapula + LUE (10/2007) both requiring surgical I+D OBESITY DYSLIPIDEMIA    - FLP 11/06/07: TC 102, TG 105, HDL <10, unable to calculate LDL  Past Surgical History: Last updated: 12/26/2008 Cholecystectomy  Family History: Last updated: 12/26/2008 Family History Diabetes 1st degree relative: Mother Mother with hx of obesity, diabetes and amputations. Died from complications of amputation. Mother was  ~43 yrs old when died. Family History of Stroke M 1st degree relative <50 Father died of stroke about 82 yrs of age.  No FH of Colon Cancer:  Social History: Last updated: 11/21/2009 Single. Divorced x2. Has 2 children, by two different fathers. 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  May apply for disability/SSI given her pain in her hands which has made interviewing for jobs difficult. Never Smoked Alcohol use-no Drug use-no Regular exercise-yes  Risk Factors: Alcohol Use: 0 (11/21/2009) Exercise: yes (11/21/2009)  Risk Factors: Smoking Status: never (11/21/2009)  Social History: Single. Divorced x2. Has 2 children, by two different fathers. 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 May apply for disability/SSI given her pain in her hands which has made interviewing for jobs difficult. Never Smoked Alcohol use-no Drug use-no Regular exercise-yes  Review of Systems General:  Denies fatigue and weakness. CV:  Denies chest pain or discomfort and swelling of feet. Resp:  Denies chest  discomfort. GI:  Denies abdominal pain, constipation, diarrhea, nausea, and vomiting. GU:  Denies dysuria.  Physical Exam  General:  alert and well-developed.   Head:  normocephalic and atraumatic.   Eyes:  vision grossly intact, pupils equal, pupils round, and pupils reactive to light.   Mouth:  good dentition.   Neck:  supple, full ROM, and no masses.   Lungs:  normal respiratory effort and normal breath sounds.   Heart:  normal rate, regular rhythm, no murmur, no gallop, and no rub.   Abdomen:  soft, non-tender, normal bowel sounds, and no distention.   Msk:  normal ROM, no joint tenderness, and no joint swelling.   Pulses:  R radial normal and L radial normal.   Extremities:  no LE edema present Neurologic:  alert & oriented X3.    Diabetes Management Exam:    Foot Exam (with socks and/or shoes not present):       Sensory-Monofilament:          Left foot: normal          Right foot: normal   Impression & Recommendations:  Problem # 1:  DIABETES MELLITUS, TYPE II (ICD-250.00) A1C at goal. No recent episodes of hypoglycemia. Patient is up to date with eye exam. There is some dry skin over both feet, but no ulcers or lesions. Will continue current regimen.   Her updated medication list for this problem includes:    Metformin Hcl 1000 Mg Tabs (Metformin hcl) .Marland Kitchen... Take 1 tablet by mouth two times a day    Lantus 100 Unit/ml Soln (Insulin glargine) ..... Inject 60 units subcutaneously at bedtime  Orders: T- Capillary Blood Glucose ([id number]) T-Hgb A1C (in-house) [id number])  Problem # 2:  DYSLIPIDEMIA (ICD-272.4) Assessment: Comment Only Lipids are well controlled with diet and exercise. Will check lipid panel today.   Orders: T-Lipid Profile [id number])  Labs Reviewed: SGOT: 11 (11/20/2008)   SGPT: 9 (11/20/2008)  Lipid Goals: Chol Goal: 200 (11/13/2008)   HDL Goal: 40 (11/13/2008)   LDL Goal: 100 (11/13/2008)   TG Goal: 150 (11/13/2008)  Prior 10 Yr Risk Heart  Disease: 8 % (11/13/2008)   HDL:39 (11/20/2008), 41 (01/27/2008)  LDL:80 (11/20/2008), 78 (01/27/2008)  Chol:140 (11/20/2008), 132 (01/27/2008)  Trig:104 (11/20/2008), 67 (01/27/2008)  Problem # 3:  Note: INADEQUATE MATERIAL RESOURCES (ICD-V60.2) Assessment: Comment Only Patient is unemployed, and is unable to obtain test strips to check blood sugars. Checked with Barnabas Harries, she currently does not have any to provide patient. Will follow up with [facility], if they are now available, as they were out of stock previously.   Problem # 4:  Preventive Health Care (ICD-V70.0) Patient will get PAP at next office visit. Mammogram has been ordered.   Complete Medication  List: 1)  Metformin Hcl 1000 Mg Tabs (Metformin hcl) .... Take 1 tablet by mouth two times a day 2)  Lantus 100 Unit/ml Soln (Insulin glargine) .... Inject 60 units subcutaneously at bedtime 3)  Truetrack Test Strp (Glucose blood) .... Use to test blood glucose 2x- 3x/day at least after lunch and dinner 4)  Lancets 30g Misc (Lancets) .... Use to test blood glucose 2-3x daily 5)  Insulin Syringe 31g X 5/16" 1 Ml Misc (Insulin syringe-needle u-100) .... Use to inject insulin once daily 6)  Hyoscyamine Sulfate 0.125 Mg Subl (Hyoscyamine sulfate) .... One tab twice per day as directed  Other Orders: Mammogram (Screening) (Mammo)  Patient Instructions: 1)  Please schedule a follow-up appointment in 3 months. 2)  Check your blood sugars regularly. If your readings are usually above : or below 70 you should contact our office. 3)  Check your feet each night for sore areas, calluses or signs of infection. Prescriptions: METFORMIN HCL 1000 MG  TABS (METFORMIN HCL) Take 1 tablet by mouth two times a day  #62 x 5   Entered and Authorized by:   Jolene Provost MD   Signed by:   Jolene Provost MD on 11/21/2009   Method used:   Print then Give to Patient   RxID:   [id number] LANTUS 100 UNIT/ML  SOLN (INSULIN GLARGINE) Inject  60 units subcutaneously at bedtime  #2 x 5   Entered and Authorized by:   Jolene Provost MD   Signed by:   Jolene Provost MD on 11/21/2009   Method used:   Print then Give to Patient   RxID:   [id number] HYOSCYAMINE SULFATE 0.125 MG  SUBL (HYOSCYAMINE SULFATE) One tab twice per day as directed  #60 x 5   Entered and Authorized by:   Jolene Provost MD   Signed by:   Jolene Provost MD on 11/21/2009   Method used:   Print then Give to Patient   RxID:   [id number] INSULIN SYRINGE 31G X 5/16" 1 ML MISC (INSULIN SYRINGE-NEEDLE U-100) use to inject insulin once daily  #100 x 4   Entered and Authorized by:   Jolene Provost MD   Signed by:   Jolene Provost MD on 11/21/2009   Method used:   Print then Give to Patient   [id number] LANCETS 30G  MISC (LANCETS) use to test blood glucose 2-3x daily  #100 x 4   Entered and Authorized by:   Jolene Provost MD   Signed by:   Jolene Provost MD on 11/21/2009   Method used:   Print then Give to Patient   [id number] TEST  STRP (GLUCOSE BLOOD) use to test blood glucose 2x- 3x/day at least after lunch and dinner  #100 x 11   Entered and Authorized by:   Jolene Provost MD   Signed by:   Jolene Provost MD on 11/21/2009   Method used:   Print then Give to Patient   RxID:   [id number]   Prevention & Chronic Care Immunizations   Influenza vaccine: Fluvax 3+  (02/11/2009)    Tetanus booster: 07/08/2009: Tdap    Pneumococcal vaccine: Not documented  Colorectal Screening   Hemoccult: Not documented   Hemoccult action/deferral: Ordered  (11/13/2008)    Colonoscopy: Location:  [place].    (01/11/2009)   Colonoscopy action/deferral: Refused  (11/13/2008)   Colonoscopy due: 01/2019  Other Screening   Pap smear: NEGATIVE FOR INTRAEPITHELIAL LESIONS OR MALIGNANCY.  (08/31/2008)  Pap smear action/deferral: Deferred  (11/21/2009)    Mammogram: ASSESSMENT: Negative - BI-RADS 1^MM DIGITAL SCREENING   (12/21/2008)   Mammogram action/deferral: Ordered  (11/21/2009)   Smoking status: never  (11/21/2009)  Diabetes Mellitus   HgbA1C: 5.8  (11/21/2009)   HgbA1C action/deferral: Ordered  (02/11/2009)   Hemoglobin A1C due: 07/09/2010    Eye exam: Mild non-proliferative diabetic retinopathy.   Visual acuity OD (best corrected): 20/40 Visual acuity OS (best corrected): 20/60 Intraocular pressure OD: 18 Intraocular pressure OS: 19   (09/19/2008)    Foot exam: yes  (11/21/2009)   Foot exam action/deferral: Do today   High risk foot: Yes  (11/13/2008)   Foot care education: Done  (11/21/2009)   Foot exam due: 10/08/2009    Urine microalbumin/creatinine ratio: 7.7  (11/13/2008)   Urine microalbumin action/deferral: Ordered    Diabetes flowsheet reviewed?: Yes   Progress toward A1C goal: Unchanged  Lipids   Total Cholesterol: 140  (11/20/2008)   Lipid panel action/deferral: Lipid Panel ordered   LDL: 80  (11/20/2008)   LDL Direct: Not documented   HDL: 39  (11/20/2008)   Triglycerides: 104  (11/20/2008)    SGOT (AST): 11  (11/20/2008)   SGPT (ALT): 9  (11/20/2008)   Alkaline phosphatase: 78  (11/20/2008)   Total bilirubin: 0.3  (11/20/2008)    Lipid flowsheet reviewed?: Yes   Progress toward LDL goal: Unchanged  Self-Management Support :   Personal Goals (by the next clinic visit) :     Personal A1C goal: 7  (11/13/2008)     Personal blood pressure goal: 130/80  (11/13/2008)     Personal LDL goal: 100  (11/13/2008)    Patient will work on the following items until the next clinic visit to reach self-care goals:     Medications and monitoring: bring all of my medications to every visit, examine my feet every day  (11/21/2009)     Eating: eat more vegetables, use fresh or frozen vegetables, eat baked foods instead of fried foods  (11/21/2009)     Activity: take a 30 minute walk every day  (07/08/2009)     Other: smaller proportions of food, walking and using treadmill   (07/08/2009)    Diabetes self-management support: Written self-care plan  (11/21/2009)   Diabetes care plan printed   Last diabetes self-management training by diabetes educator: 07/08/2009   Last medical nutrition therapy: 03/08/2008    Lipid self-management support: Written self-care plan  (11/21/2009)   Lipid self-care plan printed.   Nursing Instructions: Schedule screening mammogram (see order) Diabetic foot exam today   Process Orders Tests Sent for requisitioning (November 21, 2009 2:58 PM):     11/21/2009: [facility] -- T-Lipid Profile [id number] (signed)     Laboratory Results   Blood Tests   Date/Time Received: November 21, 2009 1:57 PM Date/Time Reported: Maryan Rued  November 21, 2009 1:57 PM  HGBA1C: 5.8%   (Normal Range: Non-Diabetic - 3-6%   Control Diabetic - 6-8%) CBG Random:: 128mg /dL     Process Orders Tests Sent for requisitioning (November 21, 2009 2:58 PM):     11/21/2009: [facility] -- T-Lipid Profile [id number] (signed)    Appended Document: FU VISIT/DS   Vital Signs:  Patient profile:   60 year old female Pulse rate:   65 / minute BP sitting:   133 / 75   Serial Vital Signs/Assessments:  Time      Position  BP       Pulse  Resp  Temp     By 2:16 PM             133/75   [employer]

## 2010-05-27 NOTE — Assessment & Plan Note (Signed)
Summary: checkup/sb.   Vital Signs:  Patient profile:   60 year old female Height:      60 inches Weight:      256.6 pounds Temp:     97.7 degrees F (36.50 degrees C) Pulse rate:   66 / minute BP sitting:   133 / 70  (left arm) Cuff size:   regular on lower forearm  Vitals Entered By: Yvonna Alanis RN (July 08, 2009 2:04 PM) CC: check up - needs all meds refilled thru health dept Is Patient Diabetic? Yes Did you bring your meter with you today? No Pain Assessment Patient in pain? yes     Location: left knee cap Intensity: 0 when sitting - hurts when walk or climb stairs Type: sharp and throbbing at times Onset of pain  increased since been using treadmill - worse at night - been about 3- 4 months Nutritional Status BMI of > 30 = obese  Have you ever been in a relationship where you felt threatened, hurt or afraid?No   Does patient need assistance? Functional Status Self care Ambulation Normal   Primary Care Provider:  Luvenia Starch, MD  CC:  check up - needs all meds refilled thru health dept.  History of Present Illness: Patient here for follow up appointment. Patient has complaint of some stiffness in joints (hands and knees). She states that the pain/stiffness is not present upon awakening worsens with activity. She points to pain at the fingertips and over the L knee. She takes either Tylenol or Advil and gets good relief from those medications when the pain is severe. She has no reported history of trauma, or swollen red joints prior to use. She does reports some swelling when she has used the joints a lot throughout the day.  She also reports doing some raking in the yard recently and now has some pain over the R upper chest.The pain is worsened with palpation and helped by Tylenol. Not associated with other chest pain or SOB.  She also reports feeling more fatigued, sweating and trembling at times. She states that these symptoms have been associated with low blood  sugar readings in the mid-50s.  She also felt like she was going to pass out during some of these episodes, but she reports that she gets better with snack almost immediately.  She does report trying to keep a better diet and exercise regularly, although her joint pains are somewhat limiting.    Problems Prior to Update: 1)  Note: Inadequate Material Resources  (ICD-V60.2) 2)  Abdominal Pain-multiple Sites  (ICD-789.09) 3)  Diarrhea  (ICD-787.91) 4)  Fecal Occult Blood  (ICD-792.1) 5)  Hemoccult Positive Stool  (ICD-578.1) 6)  Joint Pain, Hand  (ICD-719.44) 7)  Degenerative Joint Disease, Hands  (ICD-715.94) 8)  Obesity  (ICD-278.00) 9)  Family History Diabetes 1st Degree Relative  (ICD-V18.0) 10)  Sx of Thumb Pain, Right  (ICD-729.5) 11)  Postmenopausal Bleeding  (ICD-627.1) 12)  Dyslipidemia  (ICD-272.4) 13)  Diabetes Mellitus, Type II  (ICD-250.00) 14)  Hx of Cellulitis and Abscess of Unspecified Site  (ICD-682.9)  Current Problems (verified): 1)  Note: Inadequate Material Resources  (ICD-V60.2) 2)  Abdominal Pain-multiple Sites  (ICD-789.09) 3)  Diarrhea  (ICD-787.91) 4)  Fecal Occult Blood  (ICD-792.1) 5)  Hemoccult Positive Stool  (ICD-578.1) 6)  Joint Pain, Hand  (ICD-719.44) 7)  Degenerative Joint Disease, Hands  (ICD-715.94) 8)  Obesity  (ICD-278.00) 9)  Family History Diabetes 1st Degree Relative  (ICD-V18.0) 10)  Sx of Thumb Pain, Right  (ICD-729.5) 11)  Postmenopausal Bleeding  (ICD-627.1) 12)  Dyslipidemia  (ICD-272.4) 13)  Diabetes Mellitus, Type II  (ICD-250.00) 14)  Hx of Cellulitis and Abscess of Unspecified Site  (ICD-682.9)  Medications Prior to Update: 1)  Metformin Hcl 1000 Mg  Tabs (Metformin Hcl) .... Take 1 Tablet By Mouth Two Times A Day 2)  Lantus 100 Unit/ml  Soln (Insulin Glargine) .... Inject 65 Units Subcutaneously At Bedtime 3)  Truetrack Test  Strp (Glucose Blood) .... Use To Test Blood Glucose 2x- 3x/day At Least After Lunch and Dinner 4)   Lancets 30g  Misc (Lancets) .... Use To Test Blood Glucose 2-3x Daily 5)  Insulin Syringe 31g X 5/16" 1 Ml Misc (Insulin Syringe-Needle U-100) .... Use To Inject Insulin Once Daily 6)  Voltaren 1 % Gel (Diclofenac Sodium) .... Use 1/2 Gram Qid To Rt Thumb 7)  Hyoscyamine Sulfate 0.125 Mg  Subl (Hyoscyamine Sulfate) .... One Tab Twice Per Day As Directed  Current Medications (verified): 1)  Metformin Hcl 1000 Mg  Tabs (Metformin Hcl) .... Take 1 Tablet By Mouth Two Times A Day 2)  Lantus 100 Unit/ml  Soln (Insulin Glargine) .... Inject 60 Units Subcutaneously At Bedtime 3)  Truetrack Test  Strp (Glucose Blood) .... Use To Test Blood Glucose 2x- 3x/day At Least After Lunch and Dinner 4)  Lancets 30g  Misc (Lancets) .... Use To Test Blood Glucose 2-3x Daily 5)  Insulin Syringe 31g X 5/16" 1 Ml Misc (Insulin Syringe-Needle U-100) .... Use To Inject Insulin Once Daily 6)  Hyoscyamine Sulfate 0.125 Mg  Subl (Hyoscyamine Sulfate) .... One Tab Twice Per Day As Directed  Allergies (verified): 1)  ! Doxycycline  Past History:  Past Medical History: Last updated: 11/28/2007 DIABETES MELLITUS - II    - dx'd 10/2007, A1c: 12.4% MRSA ABSCESSES    - R scapula + LUE (10/2007) both requiring surgical I+D OBESITY DYSLIPIDEMIA    - FLP 11/06/07: TC 102, TG 105, HDL <10, unable to calculate LDL  Past Surgical History: Last updated: 12/26/2008 Cholecystectomy  Family History: Last updated: 12/26/2008 Family History Diabetes 1st degree relative: Mother Mother with hx of obesity, diabetes and amputations. Died from complications of amputation. Mother was  ~41 yrs old when died. Family History of Stroke M 1st degree relative <50 Father died of stroke about 17 yrs of age.  No FH of Colon Cancer:  Social History: Last updated: 11/13/2008 Single. Divorced x2. Has 2 children, by two different fathers. 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 Currently  unemployed.Drawing from unemployment for income. May apply for disability/SSI given her pain in her hands which has made interviewing for jobs difficult. Never Smoked Alcohol use-no Drug use-no Regular exercise-yes  Risk Factors: Alcohol Use: 0 (11/13/2008) Exercise: yes (11/13/2008)  Risk Factors: Smoking Status: never (02/11/2009)  Review of Systems      See HPI  Physical Exam  General:  alert, well-developed, well-nourished, and well-hydrated.   Head:  normocephalic and atraumatic.   Eyes:  vision grossly intact, pupils equal, pupils round, and pupils reactive to light.   Ears:  no external deformities.   Nose:  no external deformity, no external erythema, and no nasal discharge.   Mouth:  pharynx pink and moist.  Upper dentures in place. Neck:  supple, full ROM, no masses, no thyromegaly, no thyroid nodules or tenderness, and no carotid bruits.   Lungs:  normal respiratory  effort, no intercostal retractions, no accessory muscle use, and normal breath sounds.   Heart:  normal rate, regular rhythm, and no murmur.   Abdomen:  soft, non-tender, normal bowel sounds, no guarding, and no rigidity.   Msk:  Point tenderness over L knee laterally. Normal ROM.  Neurologic:  alert & oriented X3, cranial nerves II-XII intact, strength normal in all extremities, and sensation intact to light touch.   Skin:  turgor normal, color normal, no rashes, and no suspicious lesions.   Psych:  Oriented X3, memory intact for recent and remote, normally interactive, good eye contact, and not depressed appearing.     Impression & Recommendations:  Problem # 1:  DIABETES MELLITUS, TYPE II (ICD-250.00) Reported some lows to the mid-50s with current lantus/metformin dose. Will decrease to 60 units of lantus from 65 with A1c at 5.7. Have told patient to contact the clinic if she continues to have lows and symptoms. Will recheck A1c at next visit in three months to evaluate for control given change at this  visit. Order entered.  Her updated medication list for this problem includes:    Metformin Hcl 1000 Mg Tabs (Metformin hcl) .Marland Kitchen... Take 1 tablet by mouth two times a day    Lantus 100 Unit/ml Soln (Insulin glargine) ..... Inject 60 units subcutaneously at bedtime  Future Orders: T-Hgb A1C (in-house) [id number]) ... 07/09/2009  Problem # 2:  DEGENERATIVE JOINT DISEASE, HANDS (ICD-715.94) Improved, no longer using medication - uses brace as needed for relief. Removed voltaren gel from med list. Likely has some pain in L knee secondary to DJD from OA given report of no morning stiffness and increasing pain with use. Have instructed patient to use Tylenol as needed as this has controlled her pain and that wecould investigate further if her need for pain medication increases or does not quell the pain.  Problem # 3:  Sx of MUSCLE STRAIN, RIGHT PECTORALIS MUSCLE (ICD-848.8) Patient with reported strain after heavy use with raking. Palpable inferior to clavicle and worsened with movement of the muscle. Not associated with shortness of breath or exertion. Relieved with Tylenol when present. Continue with Tylenol as needed for pain.  Problem # 4:  Note: INADEQUATE MATERIAL RESOURCES (ICD-V60.2) Patient still without job - will contact Katy Fitch for any assistance that is available. She already has her card.  Future Orders: Social Work Referral (Social ) ... 07/09/2009  Complete Medication List: 1)  Metformin Hcl 1000 Mg Tabs (Metformin hcl) .... Take 1 tablet by mouth two times a day 2)  Lantus 100 Unit/ml Soln (Insulin glargine) .... Inject 60 units subcutaneously at bedtime 3)  Truetrack Test Strp (Glucose blood) .... Use to test blood glucose 2x- 3x/day at least after lunch and dinner 4)  Lancets 30g Misc (Lancets) .... Use to test blood glucose 2-3x daily 5)  Insulin Syringe 31g X 5/16" 1 Ml Misc (Insulin syringe-needle u-100) .... Use to inject insulin once daily 6)  Hyoscyamine Sulfate  0.125 Mg Subl (Hyoscyamine sulfate) .... One tab twice per day as directed  Other Orders: Tdap => 15yrs IM [id number]) Admin 1st Vaccine [id number])  Patient Instructions: 1)  Please schedule a follow-up appointment in 3 months. 2)  It is important that you exercise regularly at least 20 minutes 5 times a week. If you develop chest pain, have severe difficulty breathing, or feel very tired , stop exercising immediately and seek medical attention. 3)  Continue to try to lose weight. Keep following a lower calorie diet  and make sure you get regular exercise.  4)  Take 650-1000mg  of Tylenol every 4-6 hours as needed for relief of pain or comfort of fever AVOID taking more than 4000mg   in a 24 hour period (can cause liver damage in higher doses). 5)  Check your blood sugars regularly. If your readings are usually above 250 or below 70 you should contact our office. Prescriptions: HYOSCYAMINE SULFATE 0.125 MG  SUBL (HYOSCYAMINE SULFATE) One tab twice per day as directed  #60 x 5   Entered and Authorized by:   Luvenia Starch MD   Signed by:   Luvenia Starch MD on 07/08/2009   Method used:   Print then Give to Patient   RxID:   [id number] INSULIN SYRINGE 31G X 5/16" 1 ML MISC (INSULIN SYRINGE-NEEDLE U-100) use to inject insulin once daily  #100 x 4   Entered and Authorized by:   Luvenia Starch MD   Signed by:   Luvenia Starch MD on 07/08/2009   Method used:   Print then Give to Patient   RxID:   [id number] TEST  STRP (GLUCOSE BLOOD) use to test blood glucose 2x- 3x/day at least after lunch and dinner  #100 x 11   Entered and Authorized by:   Luvenia Starch MD   Signed by:   Luvenia Starch MD on 07/08/2009   Method used:   Print then Give to Patient   RxID:   [id number] METFORMIN HCL 1000 MG  TABS (METFORMIN HCL) Take 1 tablet by mouth two times a day  #62 x 5   Entered and Authorized by:   Luvenia Starch MD   Signed by:   Luvenia Starch MD on 07/08/2009   Method used:   Print then  Give to Patient   RxID:   [id number] LANTUS 100 UNIT/ML  SOLN (INSULIN GLARGINE) Inject 60 units subcutaneously at bedtime  #2 x 5   Entered and Authorized by:   Luvenia Starch MD   Signed by:   Luvenia Starch MD on 07/08/2009   Method used:   Print then Give to Patient   RxID:   [id number]    Prevention & Chronic Care Immunizations   Influenza vaccine: Fluvax 3+  (02/11/2009)    Tetanus booster: 07/08/2009: Tdap    Pneumococcal vaccine: Not documented  Colorectal Screening   Hemoccult: Not documented   Hemoccult action/deferral: Ordered  (11/13/2008)    Colonoscopy: Location:  [facility].    (01/11/2009)   Colonoscopy action/deferral: Refused  (11/13/2008)   Colonoscopy due: 01/2019  Other Screening   Pap smear: NEGATIVE FOR INTRAEPITHELIAL LESIONS OR MALIGNANCY.  (08/31/2008)    Mammogram: ASSESSMENT: Negative - BI-RADS 1^MM DIGITAL SCREENING  (12/21/2008)   Smoking status: never  (02/11/2009)  Diabetes Mellitus   HgbA1C: 5.7  (07/08/2009)   HgbA1C action/deferral: Ordered  (02/11/2009)   Hemoglobin A1C due: 07/09/2010    Eye exam: Mild non-proliferative diabetic retinopathy.   Visual acuity OD (best corrected): 20/40 Visual acuity OS (best corrected): 20/60 Intraocular pressure OD: 18 Intraocular pressure OS: 19   (09/19/2008)    Foot exam: yes  (11/13/2008)   Foot exam action/deferral: Deferred   High risk foot: Yes  (11/13/2008)   Foot care education: Done  (11/13/2008)   Foot exam due: 10/08/2009    Urine microalbumin/creatinine ratio: 7.7  (11/13/2008)   Urine microalbumin action/deferral: Ordered    Diabetes flowsheet reviewed?: Yes   Progress toward A1C goal: At goal  Lipids   Total Cholesterol: 140  (  11/20/2008)   LDL: 80  (11/20/2008)   LDL Direct: Not documented   HDL: 39  (11/20/2008)   Triglycerides: 104  (11/20/2008)    SGOT (AST): 11  (11/20/2008)   SGPT (ALT): 9  (11/20/2008)   Alkaline phosphatase: 78   (11/20/2008)   Total bilirubin: 0.3  (11/20/2008)    Lipid flowsheet reviewed?: Yes   Progress toward LDL goal: Unchanged  Self-Management Support :   Personal Goals (by the next clinic visit) :     Personal A1C goal: 7  (11/13/2008)     Personal blood pressure goal: 130/80  (11/13/2008)     Personal LDL goal: 100  (11/13/2008)    Patient will work on the following items until the next clinic visit to reach self-care goals:     Medications and monitoring: take my medicines every day, check my blood sugar, bring all of my medications to every visit, examine my feet every day  (07/08/2009)     Eating: drink diet soda or water instead of juice or soda, eat more vegetables, use fresh or frozen vegetables, eat baked foods instead of fried foods, eat fruit for snacks and desserts  (07/08/2009)     Activity: take a 30 minute walk every day  (07/08/2009)     Other: smaller proportions of food, walking and using treadmill  (07/08/2009)    Diabetes self-management support: Copy of home glucose meter record, CBG self-monitoring log, Written self-care plan, Education handout, Pre-printed educational material  (07/08/2009)   Diabetes care plan printed   Diabetes education handout printed   Last diabetes self-management training by diabetes educator: 07/08/2009   Last medical nutrition therapy: 03/08/2008    Lipid self-management support: Written self-care plan, Education handout, Pre-printed educational material  (07/08/2009)   Lipid self-care plan printed.   Lipid education handout printed   Nursing Instructions: Give tetanus booster today     Immunizations Administered:  Tetanus Vaccine:    Vaccine Type: Tdap    Site: right deltoid    Mfr: Boostrix    Dose: 0.5 ml    Route: IM    Given by: Yvonna Alanis RN    Exp. Date: 07/20/2011    Lot #: [id number]    VIS given: 03/15/07 version given July 08, 2009.

## 2010-05-27 NOTE — Letter (Signed)
Summary: Blood Sugar Log  Blood Sugar Log   Imported By: Bonner Puna 07/10/2009 14:53:51  _____________________________________________________________________  External Attachment:    Type:   Image     Comment:   External Document

## 2010-05-29 NOTE — Assessment & Plan Note (Signed)
Summary: ACUTE-F/U WITH MEDS/CFB   Vital Signs:  Patient profile:   60 year old female Height:      60 inches (152.40 cm) Weight:      251.4 pounds (114.27 kg) BMI:     49.28 Temp:     96.9 degrees F (36.06 degrees C) oral Pulse rate:   67 / minute BP sitting:   140 / 84  (left arm)  Vitals Entered By: Hilda Blades Ditzler RN (May 22, 2010 8:37 AM) Is Patient Diabetic? Yes Did you bring your meter with you today? No Pain Assessment Patient in pain? yes     Location: stomach Intensity: 8 Type: sharp Onset of pain  since 01/2010 Nutritional Status BMI of 25 - 29 = overweight Nutritional Status Detail appetite down again CBG Result 95  Have you ever been in a relationship where you felt threatened, hurt or afraid?denies   Does patient need assistance? Functional Status Self care Ambulation Normal Comments Since back on med - h/a, dizzy and blurred vision. Right upper chest discomfort to right leg.   Primary Care Provider:  Jolene Provost MD   History of Present Illness: 60 y/o w with h/o IBS and DM comes to the clinic c/o abdominal pain   generalized cramping abdominal pain since a ling time off and on due to IBS worse with restarting metformin last week radiating to right shoulder and back mild nausea and diarrhea resolved last week she was better when she off metformin for a week but the pain returned since restarting it able to tolerate by mouth intake no fever, chills, or other systemic symptoms   Depression History:      The patient denies a depressed mood most of the day and a diminished interest in her usual daily activities.         Preventive Screening-Counseling & Management  Alcohol-Tobacco     Alcohol drinks/day: 0     Smoking Status: never  Caffeine-Diet-Exercise     Diet Counseling: to improve diet; diet is suboptimal     Does Patient Exercise: yes     Type of exercise: treadmill/walking     Exercise (avg: min/session): 30-60     Times/week:  5     Exercise Counseling: to improve exercise regimen  Current Medications (verified): 1)  Metformin Hcl 1000 Mg  Tabs (Metformin Hcl) .... Do Not Take For 1 Week, Start With Half A Tablet A Day From Week 2 and Increase By Half Every Week. Stop If Your Pain Is Getting Worse. 2)  Lantus 100 Unit/ml  Soln (Insulin Glargine) .... Inject 60 Units Subcutaneously At Bedtime 3)  Truetrack Test  Strp (Glucose Blood) .... Use To Test Blood Glucose 2x- 3x/day At Least After Lunch and Dinner 4)  Lancets 30g  Misc (Lancets) .... Use To Test Blood Glucose 2-3x Daily 5)  Insulin Syringe 31g X 5/16" 1 Ml Misc (Insulin Syringe-Needle U-100) .... Use To Inject Insulin Once Daily 6)  Hyoscyamine Sulfate 0.125 Mg  Subl (Hyoscyamine Sulfate) .... Take 2 Tab Every 4 Hours As Needed For Stomach Spasms.  Allergies: 1)  ! Doxycycline  Review of Systems       The patient complains of abdominal pain.  The patient denies anorexia, fever, weight loss, weight gain, vision loss, decreased hearing, hoarseness, chest pain, syncope, dyspnea on exertion, peripheral edema, prolonged cough, headaches, hemoptysis, melena, hematochezia, severe indigestion/heartburn, hematuria, incontinence, genital sores, muscle weakness, suspicious skin lesions, transient blindness, difficulty walking, depression, unusual weight change, abnormal bleeding, enlarged  lymph nodes, angioedema, breast masses, and testicular masses.    Physical Exam  General:  Gen: VS reveiwed, Alert, well developed, nodistress ENT: mucous membranes pink & moist. No abnormal finds in ear and nose. CVC:S1 S2 , no murmurs, no abnormal heart sounds. Lungs: Clear to auscultation B/L. No wheezes, crackles or other abnormal sounds Abdomen: soft, non distended,  tender tot palpation in RUQ, epigastrium. Normal Bowel sounds EXT: no pitting edema, no engorged veins, Pulsations normal  Neuro:alert, oriented *3, cranial nerved 2-12 intact, strenght normal in all  extremities,  senstations normal to light touch.      Diabetes Management Exam:    Foot Exam (with socks and/or shoes not present):       Sensory-Monofilament:          Left foot: abnormal          Right foot: abnormal   Impression & Recommendations:  Problem # 1:  IRRITABLE BOWEL SYNDROME (ICD-564.1) Her abdminal pain is clearly getting worse with metformin from her history she is not having any gaurding and rigidity on physical exam and is tolerting oral intake  plan - stop metformin for 1 week - gradually start with 1/2 tab next week and go up if tolerated - increase hyoscyamine dose to 2 tabs qid  - GI referral - BMET to check electrolytes - Call the clinic if CBG perisistently above 400 on lantus- consider starting regular insulin or increase lantus at that time based on meter readings  Orders: Gastroenterology Referral (GI) T-Basic Metabolic Panel ([id number])  Complete Medication List: 1)  Metformin Hcl 1000 Mg Tabs (Metformin hcl) .... Do not take for 1 week, start with half a tablet a day from week 2 and increase by half every week. stop if your pain is getting worse. 2)  Lantus 100 Unit/ml Soln (Insulin glargine) .... Inject 60 units subcutaneously at bedtime 3)  Truetrack Test Strp (Glucose blood) .... Use to test blood glucose 2x- 3x/day at least after lunch and dinner 4)  Lancets 30g Misc (Lancets) .... Use to test blood glucose 2-3x daily 5)  Insulin Syringe 31g X 5/16" 1 Ml Misc (Insulin syringe-needle u-100) .... Use to inject insulin once daily 6)  Hyoscyamine Sulfate 0.125 Mg Subl (Hyoscyamine sulfate) .... Take 2 tab every 4 hours as needed for stomach spasms.  Other Orders: Capillary Blood Glucose/CBG [id number])  Patient Instructions: 1)  Please schedule a follow-up appointment in 1 month.  2)  If your pain returns with metformin, stop taking it and let us know   Orders Added: 1)  Capillary Blood Glucose/CBG [[id number]] 2)  Gastroenterology Referral [GI] 3)  T-Basic  Metabolic Panel [id number] 4)  Est. Patient Level IV [id number]   Process Orders Check Orders Results:     [facility]: ABN not required for this insurance Tests Sent for requisitioning (May 22, 2010 12:53 PM):     05/22/2010: [facility] -- T-Basic Metabolic Panel 0000000 (signed)     Prevention & Chronic Care Immunizations   Influenza vaccine: Fluvax Non-MCR  (01/30/2010)   Influenza vaccine deferral: Deferred  (01/30/2010)    Tetanus booster: 07/08/2009: Tdap    Pneumococcal vaccine: Not documented  Colorectal Screening   Hemoccult: Not documented   Hemoccult action/deferral: Ordered  (11/13/2008)    Colonoscopy: Location:  [place].    (01/11/2009)   Colonoscopy action/deferral: Refused  (11/13/2008)   Colonoscopy due: 01/2019  Other Screening   Pap smear: NEGATIVE FOR INTRAEPITHELIAL LESIONS OR MALIGNANCY.  (08/31/2008)  Pap smear action/deferral: Deferred  (01/30/2010)    Mammogram: ASSESSMENT: Negative - BI-RADS 1^MM DIGITAL SCREENING  (12/24/2009)   Mammogram action/deferral: Ordered  (11/21/2009)   Smoking status: never  (05/22/2010)  Diabetes Mellitus   HgbA1C: 5.9  (05/08/2010)   HgbA1C action/deferral: Ordered  (02/11/2009)   Hemoglobin A1C due: 07/09/2010    Eye exam: Mild non-proliferative diabetic retinopathy.   Visual acuity OD (best corrected): 20/40 Visual acuity OS (best corrected): 20/60 Intraocular pressure OD: 18 Intraocular pressure OS: 19   (09/19/2008)    Foot exam: yes  (05/22/2010)   Foot exam action/deferral: Do today   High risk foot: Yes  (05/22/2010)   Foot care education: Done  (05/08/2010)   Foot exam due: 10/08/2009    Urine microalbumin/creatinine ratio: 20.9  (05/08/2010)   Urine microalbumin action/deferral: Ordered  Lipids   Total Cholesterol: 129  (11/21/2009)   Lipid panel action/deferral: Lipid Panel ordered   LDL: 77  (11/21/2009)   LDL Direct: Not  documented   HDL: 34  (11/21/2009)   Triglycerides: 91  (11/21/2009)    SGOT (AST): 11  (11/20/2008)   SGPT (ALT): 9  (11/20/2008)   Alkaline phosphatase: 78  (11/20/2008)   Total bilirubin: 0.3  (11/20/2008)  Self-Management Support :   Personal Goals (by the next clinic visit) :     Personal A1C goal: 6  (01/30/2010)     Personal blood pressure goal: 130/80  (01/30/2010)     Personal LDL goal: 70  (01/30/2010)    Patient will work on the following items until the next clinic visit to reach self-care goals:     Medications and monitoring: take my medicines every day, check my blood sugar, bring all of my medications to every visit, examine my feet every day  (05/22/2010)     Eating: drink diet soda or water instead of juice or soda, eat more vegetables, use fresh or frozen vegetables, eat foods that are low in salt, eat baked foods instead of fried foods, eat fruit for snacks and desserts  (05/22/2010)     Activity: take a 30 minute walk every day  (05/22/2010)     Other: smaller proportions of food, walking and using treadmill  (07/08/2009)    Diabetes self-management support: Written self-care plan, Education handout, Resources for patients handout  (05/22/2010)   Diabetes care plan printed   Diabetes education handout printed   Last diabetes self-management training by diabetes educator: 07/08/2009   Last medical nutrition therapy: 03/08/2008    Lipid self-management support: Written self-care plan, Education handout, Resources for patients handout  (05/22/2010)   Lipid self-care plan printed.   Lipid education handout printed      Resource handout printed.   Last LDL:                                                 77 (11/21/2009 9:58:00 PM)        Diabetic Foot Exam Foot Inspection Is there a history of a foot ulcer?              No Is there a foot ulcer now?              No Can the patient see the bottom of their feet?          Yes Are the shoes appropriate in  style  and fit?          Yes Is there swelling or an abnormal foot shape?          No Are the toenails long?                No Are the toenails thick?                Yes Are the toenails ingrown?              No Is there heavy callous build-up?              No Is there a claw toe deformity?                          No Is there elevated skin temperature?            No Is there limited ankle dorsiflexion?            No Is there foot or ankle muscle weakness?            No Do you have pain in calf while walking?           No       High Risk Feet? Yes   10-g (5.07) Semmes-Weinstein Monofilament Test Performed by: Hilda Blades Ditzler RN          Right Foot          Left Foot Visual Inspection     abnormal         abnormal Test Control      abnormal         abnormal Site 1         abnormal         abnormal Site 2         abnormal         abnormal Site 3         abnormal         abnormal Site 4         abnormal         abnormal Site 5         abnormal         abnormal Site 6         abnormal         abnormal Site 7         abnormal         abnormal Site 8         abnormal         abnormal Site 9         abnormal         abnormal Site 10         abnormal         abnormal  Impression      abnormal         abnormal

## 2010-05-29 NOTE — Assessment & Plan Note (Signed)
Summary: EST-3 MONTH F/U VISIT/CH   Vital Signs:  Patient profile:   60 year old female Height:      60 inches (152.40 cm) Weight:      256.9 pounds (116.77 kg) BMI:     50.35 Temp:     97.8 degrees F oral Pulse rate:   69 / minute BP sitting:   126 / 66  (right arm) Cuff size:   large  Vitals Entered By: Morrison Old RN (May 08, 2010 1:49 PM) CC: F/U visit. Abdomen pain is worse.  Having diarrhea. No energy,decreased appetite, abd. cramping. Hyoscyamine making her "sick on her stomach" Is Patient Diabetic? Yes Did you bring your meter with you today? Yes Pain Assessment Patient in pain? no     Location: abdomen Nutritional Status BMI of > 30 = obese  Have you ever been in a relationship where you felt threatened, hurt or afraid?No   Does patient need assistance? Functional Status Self care Ambulation Normal Comments unable to downoad pt.'s type of meter.   Diabetic Foot Exam Last Podiatry Exam Date: 11/21/2009 Foot Inspection Is there a history of a foot ulcer?              No Is there a foot ulcer now?              No Can the patient see the bottom of their feet?          Yes Are the shoes appropriate in style and fit?          Yes Is there swelling or an abnormal foot shape?          No Are the toenails long?                No Are the toenails thick?                Yes Are the toenails ingrown?              No Is there heavy callous build-up?              No Is there pain in the calf muscle (Intermittent claudication) when walking?    NoIs there a claw toe deformity?              No Is there elevated skin temperature?            No Is there limited ankle dorsiflexion?            No Is there foot or ankle muscle weakness?            No  Diabetic Foot Care Education Patient educated on appropriate care of diabetic feet.  Pulse Check          Right Foot          Left Foot Posterior Tibial:        normal            normal Dorsalis Pedis:        normal             normal    10-g (5.07) Semmes-Weinstein Monofilament Test Performed by: Jolene Provost MD          Right Foot          Left Foot Visual Inspection               Test Control      normal  normal Site 1         normal         normal Site 2         normal         normal Site 3         normal         normal Site 4         normal         normal Site 5         normal         normal Site 6         normal         normal Site 7         normal         normal Site 8         normal         normal Site 9         normal         normal Site 10         normal         normal  Impression      normal         normal   Primary Care Provider:  Luvenia Starch, MD  CC:  F/U visit. Abdomen pain is worse.  Having diarrhea. No energy, decreased appetite, and abd. cramping. Hyoscyamine making her "sick on her stomach".  History of Present Illness: Ms. Rickley is a 60 year old woman with pmh significant for DM II, IBS, HLD, and degenerative joint disease presents to [facility] for general check-up and follow up regarding diabetes.  1) IBS - c/o of abdominal pain and cramping since November 2011. States she can hardly eat because of the pain, and that when she does eat, she will develop loose stools. She also complains of nausea, but no vomiting.   2) DM - Her CBGs have been running  between 115-170. No episodes of hypoglycemia.     Depression History:      The patient denies a depressed mood most of the day and a diminished interest in her usual daily activities.         Preventive Screening-Counseling & Management  Alcohol-Tobacco     Alcohol drinks/day: 0     Smoking Status: never  Caffeine-Diet-Exercise     Diet Counseling: to improve diet; diet is suboptimal     Does Patient Exercise: yes     Type of exercise: treadmill/walking     Exercise (avg: min/session): 30-60     Times/week: 5     Exercise Counseling: to improve exercise regimen  Current Medications (verified): 1)  Metformin Hcl 1000  Mg  Tabs (Metformin Hcl) .... Take 1 Tablet By Mouth Two Times A Day 2)  Lantus 100 Unit/ml  Soln (Insulin Glargine) .... Inject 60 Units Subcutaneously At Bedtime 3)  Truetrack Test  Strp (Glucose Blood) .... Use To Test Blood Glucose 2x- 3x/day At Least After Lunch and Dinner 4)  Lancets 30g  Misc (Lancets) .... Use To Test Blood Glucose 2-3x Daily 5)  Insulin Syringe 31g X 5/16" 1 Ml Misc (Insulin Syringe-Needle U-100) .... Use To Inject Insulin Once Daily 6)  Hyoscyamine Sulfate 0.125 Mg  Subl (Hyoscyamine Sulfate) .... Take 1 Tab Every 4 Hours As Needed For Stomach Spasms.  Allergies (verified): 1)  ! Doxycycline  Past History:  Past Medical History: Last updated: 11/28/2007 DIABETES MELLITUS - II    -  dx'd 10/2007, A1c: 12.4% MRSA ABSCESSES    - R scapula + LUE (10/2007) both requiring surgical I+D OBESITY DYSLIPIDEMIA    - FLP 11/06/07: TC 102, TG 105, HDL <10, unable to calculate LDL  Past Surgical History: Last updated: 12/26/2008 Cholecystectomy  Family History: Last updated: 12/26/2008 Family History Diabetes 1st degree relative: Mother Mother with hx of obesity, diabetes and amputations. Died from complications of amputation. Mother was  ~5 yrs old when died. Family History of Stroke M 1st degree relative <50 Father died of stroke about 52 yrs of age.  No FH of Colon Cancer:  Social History: Last updated: 11/21/2009 Single. Divorced x2. Has 2 children, by two different fathers. 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 May apply for disability/SSI given her pain in her hands which has made interviewing for jobs difficult. Never Smoked Alcohol use-no Drug use-no Regular exercise-yes  Risk Factors: Alcohol Use: 0 (05/08/2010) Exercise: yes (05/08/2010)  Risk Factors: Smoking Status: never (05/08/2010)  Review of Systems General:  Denies loss of appetite and weakness. GI:  Complains  of abdominal pain, diarrhea, and nausea.  Physical Exam  General:  alert and well-developed.   Neck:  supple.   Lungs:  normal respiratory effort and normal breath sounds.   Heart:  normal rate and regular rhythm.   Abdomen:  soft and non-tender.   Msk:  normal ROM.   Extremities:  no edema  Neurologic:  alert & oriented X3.    Diabetes Management Exam:    Foot Exam (with socks and/or shoes not present):       Sensory-Monofilament:          Left foot: normal          Right foot: normal   Impression & Recommendations:  Problem # 1:  DIABETES MELLITUS, TYPE II (ICD-250.00)  Well controlled. No abnormal highs or lows in blood sugars. Will continue current regimen, will stop Metformin for one week in setting of ongoing abdominal pain, to see if that helps her abdominal discomfort. Foot exam complete, will obtain urine alb/cr today.   Her updated medication list for this problem includes:    Metformin Hcl 1000 Mg Tabs (Metformin hcl) .Marland Kitchen... Take 1 tablet by mouth two times a day    Lantus 100 Unit/ml Soln (Insulin glargine) ..... Inject 60 units subcutaneously at bedtime  Labs Reviewed: Creat: 0.81 (01/30/2010)     Last Eye Exam: Mild non-proliferative diabetic retinopathy.   Visual acuity OD (best corrected): 20/40 Visual acuity OS (best corrected): 20/60 Intraocular pressure OD: 18 Intraocular pressure OS: 19  (09/19/2008) Reviewed HgBA1c results: 5.9 (05/08/2010)  6.0 (01/30/2010)  Orders: T- Capillary Blood Glucose (82948) T-Hgb A1C (in-house) [id number]) T-Urine Microalbumin w/creat. ratio [phone])  Problem # 2:  IRRITABLE BOWEL SYNDROME (ICD-564.1) Abdominal discomfort accompanied by loose stools, cramping, and nausea likely related to ongoing IBS. Pt is on hyoscyamine which she says controls spasms, however pain and cramping continue to persist. Will hold Metformin for one week to see if her symptoms improve. If they do not, would consider GI consult given the  chronicity of her symptoms. Currently she is not actively having diarrhea, and last colonoscopy was done 12/2008, wnl.   Complete Medication List: 1)  Metformin Hcl 1000 Mg Tabs (Metformin hcl) .... Take 1 tablet by mouth two times a day 2)  Lantus 100 Unit/ml Soln (Insulin glargine) .... Inject 60 units subcutaneously at bedtime 3)  Truetrack Test Strp (Glucose blood) .... Use to test blood glucose 2x- 3x/day at least after lunch and dinner 4)  Lancets 30g Misc (Lancets) .... Use to test blood glucose 2-3x daily 5)  Insulin Syringe 31g X 5/16" 1 Ml Misc (Insulin syringe-needle u-100) .... Use to inject insulin once daily 6)  Hyoscyamine Sulfate 0.125 Mg Subl (Hyoscyamine sulfate) .... Take 1 tab every 4 hours as needed for stomach spasms.  Patient Instructions: 1)  Please do not take Metformin for one week, and contact the clinic next week to let Dr. Ina Homes know if you are still experiencing stomach pain.    Orders Added: 1)  T- Capillary Blood Glucose [82948] 2)  T-Hgb A1C (in-house) [[id number]] 3)  T-Urine Microalbumin w/creat. ratio [[id number]] 4)  Est. Patient Level III [id number]    Prevention & Chronic Care Immunizations   Influenza vaccine: Fluvax Non-MCR  (01/30/2010)   Influenza vaccine deferral: Deferred  (01/30/2010)    Tetanus booster: 07/08/2009: Tdap    Pneumococcal vaccine: Not documented  Colorectal Screening   Hemoccult: Not documented   Hemoccult action/deferral: Ordered  (11/13/2008)    Colonoscopy: Location:  [place].    (01/11/2009)   Colonoscopy action/deferral: Refused  (11/13/2008)   Colonoscopy due: 01/2019  Other Screening   Pap smear: NEGATIVE FOR INTRAEPITHELIAL LESIONS OR MALIGNANCY.  (08/31/2008)   Pap smear action/deferral: Deferred  (01/30/2010)    Mammogram: ASSESSMENT: Negative - BI-RADS 1^MM DIGITAL SCREENING  (12/24/2009)   Mammogram action/deferral: Ordered  (11/21/2009)   Smoking status: never   (05/08/2010)  Diabetes Mellitus   HgbA1C: 5.9  (05/08/2010)   HgbA1C action/deferral: Ordered  (02/11/2009)   Hemoglobin A1C due: 07/09/2010    Eye exam: Mild non-proliferative diabetic retinopathy.   Visual acuity OD (best corrected): 20/40 Visual acuity OS (best corrected): 20/60 Intraocular pressure OD: 18 Intraocular pressure OS: 19   (09/19/2008)    Foot exam: yes  (05/08/2010)   Foot exam action/deferral: Do today   High risk foot: Yes  (11/13/2008)   Foot care education: Done  (05/08/2010)   Foot exam due: 10/08/2009    Urine microalbumin/creatinine ratio: 7.7  (11/13/2008)   Urine microalbumin action/deferral: Ordered    Diabetes flowsheet reviewed?: Yes   Progress toward A1C goal: At goal  Lipids   Total Cholesterol: 129  (11/21/2009)   Lipid panel action/deferral: Lipid Panel ordered   LDL: 77  (11/21/2009)   LDL Direct: Not documented   HDL: 34  (11/21/2009)   Triglycerides: 91  (11/21/2009)    SGOT (AST): 11  (11/20/2008)   SGPT (ALT): 9  (11/20/2008)   Alkaline phosphatase: 78  (11/20/2008)   Total bilirubin: 0.3  (11/20/2008)    Lipid flowsheet reviewed?: Yes   Progress toward LDL goal: At goal  Self-Management Support :   Personal Goals (by the next clinic visit) :     Personal A1C goal: 6  (01/30/2010)     Personal blood pressure goal: 130/80  (01/30/2010)     Personal LDL goal: 70  (01/30/2010)    Patient will work on the following items until the next clinic visit to reach self-care goals:     Medications and monitoring: take my medicines every day, bring all of my medications to every visit, examine my feet every day  (05/08/2010)     Eating: eat more vegetables, use fresh or frozen vegetables, eat foods that are low in salt, eat baked foods instead of fried foods  (05/08/2010)     Activity: take  a 30 minute walk every day  (05/08/2010)     Other: smaller proportions of food, walking and using treadmill  (07/08/2009)    Diabetes self-management  support: Written self-care plan  (05/08/2010)   Diabetes care plan printed   Last diabetes self-management training by diabetes educator: 07/08/2009   Last medical nutrition therapy: 03/08/2008    Lipid self-management support: Written self-care plan  (05/08/2010)   Lipid self-care plan printed.   Nursing Instructions: Diabetic foot exam today   Process Orders Check Orders Results:     [facility]: [id number] not required for this insurance Tests Sent for requisitioning (May 08, 2010 2:34 PM):     05/08/2010: [facility] -- T-Urine Microalbumin w/creat. ratio [[id number]] (signed)     Laboratory Results   Blood Tests   Date/Time Received: May 08, 2010 2:10 PM Date/Time Reported: Maryan Rued  May 08, 2010 2:10 PM   HGBA1C: 5.9%   (Normal Range: Non-Diabetic - 3-6%   Control Diabetic - 6-8%) CBG Fasting:: 124mg /dL

## 2010-05-29 NOTE — Progress Notes (Signed)
  Phone Note Other Incoming   Request: Send information Summary of Call: Request for records received from [place]. Request forwarded to [facility].

## 2010-05-29 NOTE — Progress Notes (Signed)
Summary: phone/gg   *  Phone Note Call from Patient   Caller: Patient Summary of Call: Pt called to let us know she is still having abd pain. It has not changed since last visit. Now appetite improved and headaches are better but she is dizzy, thirsty, and seems off balance. Do you want her to restart metformin??  Do you want to put in for a GI consult? Please call pt. [phone]  Can not get in touch with Dr Ina Homes Initial call taken by: Gevena Cotton RN,  May 15, 2010 11:38 AM  Follow-up for Phone Call        Is she checking her blood sugars?  If so, how high are they? Follow-up by: Bertha Stakes MD,  May 15, 2010 4:23 PM  Additional Follow-up for Phone Call Additional follow up Details #1::        Talked with pt and she reports CBG's are 268,211,215,296 and 208  these are from last [place]. to today. She does state she is off balance since stoping metformin. It comes and goes when she is dizzy.  It has happened 3 time in past several days.  She has not had any episodes yesterday or today. Additional Follow-up by: Gevena Cotton RN,  May 15, 2010 4:57 PM    Additional Follow-up for Phone Call Additional follow up Details #2::    I would advise her to restart metformin at previous dose, and schedule follow-up as soon as possible in clinic to re-evaluate her abdominal pain.  If her symptoms worsen acutely in the meantime, would advise urgent care or ED evaluation.  Follow-up by: Bertha Stakes MD,  May 15, 2010 5:12 PM  Additional Follow-up for Phone Call Additional follow up Details #3:: Details for Additional Follow-up Action Taken: pt calls to schedule an appt, this is forwarded to [facility] for appt, she also instructed to restart metformin at her previous dose, she repeated instructions back. Additional Follow-up by: Freddy Finner RN,  May 16, 2010 10:52 AM  Noted, agree with Dr. Marinda Elk recommendations. I do not believe her symptoms are related to Metformin. Would  consider GI consult at next office visit.

## 2010-06-20 ENCOUNTER — Encounter: Payer: Self-pay | Admitting: Internal Medicine

## 2010-06-20 ENCOUNTER — Ambulatory Visit (INDEPENDENT_AMBULATORY_CARE_PROVIDER_SITE_OTHER): Payer: Self-pay | Admitting: Internal Medicine

## 2010-06-20 DIAGNOSIS — E119 Type 2 diabetes mellitus without complications: Secondary | ICD-10-CM

## 2010-06-20 DIAGNOSIS — K589 Irritable bowel syndrome without diarrhea: Secondary | ICD-10-CM

## 2010-06-20 LAB — GLUCOSE, CAPILLARY: Glucose-Capillary: 118 mg/dL — ABNORMAL HIGH (ref 70–99)

## 2010-06-20 MED ORDER — AMITRIPTYLINE HCL 25 MG PO TABS: ORAL_TABLET | ORAL | Status: DC

## 2010-06-20 MED ORDER — HYOSCYAMINE SULFATE ER 0.375 MG PO TB12: ORAL_TABLET | ORAL | Status: DC

## 2010-06-20 MED ORDER — INSULIN GLARGINE 100 UNIT/ML ~~LOC~~ SOLN: SUBCUTANEOUS | Status: DC

## 2010-06-20 NOTE — Progress Notes (Signed)
  Subjective:    Patient ID: Kathleen [name], female    DOB: [birth year], 60 y.o.   MRN: [id number]  HPI  The patient is a 60 year old woman with a history of type 2 diabetes as well as irritable bowel syndrome here on followup of her chronic abdominal pain. The patient reports onset of abdominal pain shortly after her diagnosis of diabetes which was in 2008.  This has been associated with watery diarrhea as well as a significant amount of nausea and occasional vomiting. In 2010 during her workup for diarrhea patient was noted to be FOBT positive.  However a colonoscopy  obtained at that time that was benign  and negative for any inflammatory changes.  She has been treated with hyoscyamine and the patient states this works occasionally for her. Her last visit patient presented with persistent  crampy abdominal pain and watery diarrhea  And she was instructed to stop taking metformin for one week period and to halve her current dose.  Unfortunately patient states that she did not notice a difference in her nausea or abdominal pain and diarrhea with these changes made to her metformin.  She states all the symptoms have been persistent and have not improved.   Review of Systems  Constitutional: Negative for fever and chills.  Respiratory: Negative for shortness of breath.   Cardiovascular: Negative for chest pain and palpitations.  Gastrointestinal: Positive for nausea, abdominal pain and diarrhea. Negative for vomiting.  Genitourinary: Negative for dysuria.  Neurological: Negative for weakness.       Objective:   Physical Exam  Constitutional: She is oriented to person, place, and time. She appears well-developed and well-nourished. No distress.  Cardiovascular: Normal rate, regular rhythm and normal heart sounds.  Exam reveals no gallop and no friction rub.   No murmur heard. Pulmonary/Chest: Effort normal and breath sounds normal. No respiratory distress. She has no wheezes. She has no rales.    Abdominal: Soft. Bowel sounds are normal.       Obese, tender to palpation diffusely but more so in the epigastric area and the right upper quadrant and flank.  Musculoskeletal: Normal range of motion.  Neurological: She is alert and oriented to person, place, and time.  Psychiatric: She has a normal mood and affect.          Assessment & Plan:

## 2010-06-20 NOTE — Patient Instructions (Signed)
Please note the following changes:  1. Stop taking Metformin 2. Reduce Lantus to 50 units at bedtime 3. Check your fasting blood sugars in the morning after you wake up 4. You will be starting a new medicine called Amitriptyline. It will help with the abdominal pain, feet pain and control your diarrhea. Take one tablet daily for the next 2 weeks and then 2 tablets daily. 5. You will also be starting a medicine called Levbid. It is the pill form of the Hyoscyamine. You will take 2 tablets twice a day. 5. Try to stay away from fatty foods, sodas and spicy foods. Also stay away from milk based products. Let's see if this helps your diarrhea over the next one month. However, continue as you are doing now and eat more fruits and vegetables. 7. Exercise as much as you can. Weight loss will help you feel a lot better. 8. Please return to the clinic in one month so we can follow up on these issues. In the meantime, if you have any questions or concerns, do not hesitate to call the clinic.   Lactose Free Diet   Lactose is a carbohydrate that is found mainly in milk and milk products, as well as in foods with added milk or whey. Lactose must be digested by the enzyme in order to be used by the body.  Lactose intolerance occurs when there is a shortage of lactase.   When your body is not able to digest lactose, you may feel sick to your stomach (nausea), bloating, cramping, gas and diarrhea.   TYPES OF LACTASE DEFICIENCY  Primary lactase deficiency. This is the most common type. It is characterized by a slow decrease in lactase activity.   Secondary lactase deficiency. This occurs following injury to the small intestinal mucosa as a result of diseases such as celiac disease, nontropical sprue, infectious gastroenteritis (stomach virus), malnutrition, parasites, or inflammatory bowel disease. It can also occur after treatment with medications that kill germs  (antibiotics) or cancer drugs, or as a result of  surgery.   Tolerance to lactose varies widely, and each person must determine how much milk can be consumed without developing symptoms. Drinking smaller portions of milk throughout the day may be helpful. Some studies suggest that slowing gastric emptying may help increase tolerance of milk products. This may be done by:  Consuming milk or milk products with a meal rather than alone.  Using milk with a higher fat content.   There are many dairy products that may be tolerated better than milk by some people:  Cheese (especially aged cheese)  -  the lactose content is much lower than in milk.  The use of cultured dairy products such as yogurt, buttermilk, cottage cheese, and sweet acidophilus milk (Kefir) for lactase-deficient individuals is usually well tolerated. This is because the healthy bacteria help digest lactose.  Lactose-hydrolyzed milk (Lactaid) contains 40-90% less lactose than milk and may also be well tolerated.    ADEQUACY These diets may be deficient in calcium, riboflavin, and vitamin D, according to the Recommended Dietary Allowances of the [employer]. Depending on individual tolerances and the use of milk substitutes, milk, or other dairy products, these recommendations may be met.   SPECIAL NOTES  Lactose is a carbohydrates. The major food source is dairy products. Reading food labels is important. Many products contain lactose even when they are not made from milk. Look for the following words: whey, milk solids, dry milk solids, nonfat dry milk  powder. Typical sources of lactose other than dairy products include breads, candies, cold cuts, prepared and processed foods, and commercial sauces and gravies.  All foods must be prepared without milk, cream, or other dairy foods.  A vitamin/mineral supplement may be necessary. Consult your physician or Registered Dietitian.  Lactose also is found in many prescription and over-the-counter medications.  Soy  milk and lactose-free supplements may be used as an alternative to milk.   FOOD GROUP ALLOWED/RECOMMENDED AVOID/USE SPARINGLY  BREADS / STARCHES 4 servings or more* Breads and rolls made without milk. [place], [place], or [place] bread. Breads and rolls that contain milk. Prepared mixes such as muffins, biscuits, waffles, pancakes. Sweet rolls, donuts, [place] toast (if made with milk or lactose).  Crackers: Soda crackers, graham crackers. Any crackers prepared without lactose. Zwieback crackers, corn curls, or any that contain lactose.  Cereals: Cooked or dry cereals prepared without lactose (read labels). Cooked or dry cereals prepared with lactose (read labels). Total, Cocoa Krispies. Special K.  Potatoes / Pasta / Rice: Any prepared without milk or lactose. Popcorn. Instant potatoes, frozen [place] fries, scalloped or au gratin potatoes.  VEGETABLES 2 servings or more Fresh, frozen, and canned vegetables. Creamed or breaded vegetables. Vegetables in a cheese sauce or with lactose-containing margarines.  FRUIT 2 servings or more All fresh, canned, or frozen fruits that are not processed with lactose. Any canned or frozen fruits processed with lactose.  MEAT & SUBSTITUTES 2 servings or more (4 to 6 oz. total per day) Plain beef, chicken, fish, [place], lamb, veal, pork, or ham. Kosher prepared meat products. Strained or junior meats that do not contain milk. Eggs, soy meat substitutes, nuts. Scrambled eggs, omelets, and souffles that contain milk. Creamed or breaded meat, fish, or fowl. Sausage products such as wieners, liver sausage, or cold cuts that contain milk solids. Cheese, cottage cheese, or cheese spreads.  MILK None. (See "BEVERAGES" for milk substitutes. See "DESSERTS" for ice cream and frozen desserts.) Milk (whole, 2%, skim, or chocolate). Evaporated, powdered, or condensed milk; malted milk.  SOUPS & COMBINATION FOODS Bouillon, broth, vegetable soups, clear soups, consomms. Homemade  soups made with allowed ingredients. Combination or prepared foods that do not contain milk or milk products (read labels). Cream soups, chowders, commercially prepared soups containing lactose. Macaroni and cheese, pizza. Combination or prepared foods that contain milk or milk products.  DESSERTS & SWEETS In moderation Water and fruit ices; gelatin; angel food cake. Homemade cookies, pies, or cakes made from allowed ingredients. Pudding (if made with water or a milk substitute). Lactose-free tofu desserts. Sugar, honey, corn syrup, jam, jelly; marmalade; molasses (beet sugar); Pure sugar candy; marshmallows. Ice cream, ice milk, sherbet, custard, pudding, frozen yogurt. Commercial cake and cookie mixes. Desserts that contain chocolate. Pie crust made with milk-containing margarine; reduced-calorie desserts made with a sugar substitute that contains lactose. Toffee, peppermint, butterscotch, chocolate, caramels.  FATS & OILS In moderation Butter (as tolerated; contains very small amounts of lactose). Margarines and dressings that do not contain milk, Vegetable oils, shortening, Miracle Whip, mayonnaise, nondairy cream & whipped toppings without lactose or milk solids added (examples: Coffee Rich, Carnation Coffeemate, Rich's Whipped Topping, PolyRich). Berniece Salines. Margarines and salad dressings containing milk; cream, cream cheese; peanut butter with added milk solids, sour cream, chip dips, made with sour cream.  BEVERAGES Carbonated drinks; tea; coffee and freeze-dried coffee; some instant coffees (check labels). Fruit drinks; fruit and vegetable juice; Rice or Soy milk. Ovaltine, hot chocolate. Some cocoas; some instant coffees; instant  iced teas; powdered fruit drinks (read labels).   CONDIMENTS / MISCELLANEOUS Soy sauce, carob powder, olives, gravy made with water, baker's cocoa, pickles, pure seasonings and spices, wine, pure monosodium glutamate, catsup, mustard. Some chewing gums, chocolate, some  cocoas. Certain antibiotics and vitamin / mineral preparations. Spice blends if they contain milk products. MSG extender. Artificial sweeteners that contain lactose such as Equal (Nutra-Sweet) and Sweet 'n Low. Some nondairy creamers (read labels).    * These amounts indicate the minimum number of servings needed from the basic food groups to provide a variety of nutrients essential to good health. A maximum amount is listed if intake of certain foods must be controlled. Combination foods may count as full or partial servings from the food groups. Dark green, leafy, or orange vegetables are recommended 3 or 4 times weekly to provide vitamin A. A good source of vitamin C is recommended daily. Potatoes may be included as a serving of vegetables.   SAMPLE MENU*  Breakfast l Orange Juice. l Banana. l Bran flakes. l Nondairy Creamer. l Vienna Bread (toasted). l Butter or milk-free margarine. l Coffee or tea.     Noon Meal l Chicken Breast. l Rice. l Green beans. l Butter or milk-free margarine. l Fresh melon. l Coffee or tea.     Evening Meal l Roast Beef. l Baked potato. l Butter or milk-free margarine. l Broccoli. l Lettuce salad with vinegar and oil dressing. l Angel food cake. l Coffee or tea.    Document Released: 10/03/2001  Document Re-Released: 10/10/2007 [facility] Patient Information 2011 [place].

## 2010-06-20 NOTE — Progress Notes (Signed)
Received call from [place]. Pharmacy; pt. Did not have rx for Lantus insulin; verbal order given to pharmacist.

## 2010-06-23 NOTE — Assessment & Plan Note (Signed)
With a hemoglobin A1c of 5.9 and fasting blood sugars in the mid 100s we'll reduce her Lantus to 50 units at bedtime. Patient instructed to continue to check her daily CBGs and these can be  Evaluated on patient's return visit and her Lantus can be readjusted at that time.

## 2010-06-23 NOTE — Assessment & Plan Note (Signed)
Patient continues to complain of persistent crampy abdominal pain , nausea, and multiple daily episodes of watery diarrhea.   Workup that has included colonoscopy has been negative for any inflammatory changes. However, she does state that hyoscyamine occasionally works for her.  For today, the following changes will be made -  Discontinue metformin -  Change hyoscyamine to by mouth tablet 0.75 mg twice daily -  Add low dose tricyclic ( Amitriptyline 25 mg daily) -  This will help with the abdominal pain and the anticholinergic activity should hopefully help slow down the diarrhea.  Dose can be increased at next visit. -  A one-month trial of lactose-free diet -  Assess symptoms at the next office visit can evaluate how she responded to the changes made above.

## 2010-07-10 LAB — GLUCOSE, CAPILLARY: Glucose-Capillary: 72 mg/dL (ref 70–99)

## 2010-07-12 LAB — GLUCOSE, CAPILLARY: Glucose-Capillary: 128 mg/dL — ABNORMAL HIGH (ref 70–99)

## 2010-07-21 ENCOUNTER — Ambulatory Visit (INDEPENDENT_AMBULATORY_CARE_PROVIDER_SITE_OTHER): Payer: Self-pay | Admitting: Internal Medicine

## 2010-07-21 ENCOUNTER — Encounter: Payer: Self-pay | Admitting: Internal Medicine

## 2010-07-21 DIAGNOSIS — K589 Irritable bowel syndrome without diarrhea: Secondary | ICD-10-CM

## 2010-07-21 DIAGNOSIS — E119 Type 2 diabetes mellitus without complications: Secondary | ICD-10-CM

## 2010-07-21 MED ORDER — LOPERAMIDE HCL 2 MG PO TABS: 2.0000 mg | ORAL_TABLET | Freq: Three times a day (TID) | ORAL | Status: AC | PRN

## 2010-07-21 MED ORDER — AMITRIPTYLINE HCL 50 MG PO TABS: 50.0000 mg | ORAL_TABLET | Freq: Every day | ORAL | Status: DC

## 2010-07-21 MED ORDER — METFORMIN HCL ER 500 MG PO TB24: 500.0000 mg | ORAL_TABLET | Freq: Every day | ORAL | Status: DC

## 2010-07-21 NOTE — Assessment & Plan Note (Addendum)
Continue current regimen. Add loperamide at mealtime as needed. Will check IgA anti-TTG for celiac disease and follow up GI evaluation.

## 2010-07-21 NOTE — Progress Notes (Signed)
  Subjective:    Patient ID: Kathleen [name], female    DOB: 01/05/1951, 60 y.o.   MRN: [id number]  HPI  60 yo woman with  Past Medical History  Diagnosis Date  . Irritable bowel syndrome   . Fecal occult blood test positive   . Degenerative joint disease of hand   . Obesity   . Post-menopausal bleeding   . Dyslipidemia   . Diabetes mellitus   . Inadequate material resources   comes to the clinic for follow up of IBS (abdominal pain and diarrhea). Abdominal pain has gotten better since starting elavil and increasing hyoscyamine. Diarrhea frequency has improved but has persisted after mealtime. Denies fever/chills, shortness of breath, chest pain, orthopnea, palpitations, diaphoresis, hematochezia, melena.   Patient will see Gastroenterologist on April 24.  DM: She did not bring meter but reports that over the last week blood glucose levels have been reading in 200s.  Review of Systems  Constitutional: Negative for fever and chills.  Respiratory: Negative for shortness of breath.   Cardiovascular: Negative for chest pain and palpitations.  Gastrointestinal: Positive for abdominal pain and diarrhea. Negative for vomiting.  Genitourinary: Negative for dysuria.  Neurological: Negative for weakness.       Objective:   Physical Exam  Constitutional: She is oriented to person, place, and time. She appears well-developed and well-nourished. No distress.  Cardiovascular: Normal rate, regular rhythm and normal heart sounds.  Exam reveals no gallop and no friction rub.   No murmur heard. Pulmonary/Chest: Effort normal and breath sounds normal. No respiratory distress. She has no wheezes. She has no rales.  Abdominal: Soft. Bowel sounds are normal.       Obese, tender to palpation diffusely but more so in the epigastric area and the right upper quadrant and flank.  Musculoskeletal: Normal range of motion.  Neurological: She is alert and oriented to person, place, and time.  Psychiatric: She  has a normal mood and affect.          Assessment & Plan:     Review of Systems     Objective:   Physical Exam        Assessment & Plan:

## 2010-07-21 NOTE — Assessment & Plan Note (Signed)
Patient did not bring meter but based on home readings will restart Metformin (long acting, low dose). Denies hypoglycemic episodes. Patient was instructed to bring meter on follow up for further titration if needed.

## 2010-07-21 NOTE — Patient Instructions (Signed)
Make a follow up appointment with PCP. Take all medication as directed.

## 2010-07-31 LAB — GLUCOSE, CAPILLARY: Glucose-Capillary: 112 mg/dL — ABNORMAL HIGH (ref 70–99)

## 2010-08-11 ENCOUNTER — Ambulatory Visit: Payer: Self-pay | Admitting: Gastroenterology

## 2010-08-19 ENCOUNTER — Ambulatory Visit (INDEPENDENT_AMBULATORY_CARE_PROVIDER_SITE_OTHER): Payer: Self-pay | Admitting: Gastroenterology

## 2010-08-19 ENCOUNTER — Encounter: Payer: Self-pay | Admitting: Gastroenterology

## 2010-08-19 VITALS — BP 126/82 | HR 60 | Ht 61.0 in | Wt 264.0 lb

## 2010-08-19 DIAGNOSIS — K589 Irritable bowel syndrome without diarrhea: Secondary | ICD-10-CM

## 2010-08-19 DIAGNOSIS — R109 Unspecified abdominal pain: Secondary | ICD-10-CM

## 2010-08-19 DIAGNOSIS — R197 Diarrhea, unspecified: Secondary | ICD-10-CM

## 2010-08-19 NOTE — Patient Instructions (Signed)
Return to your Primary care physician for medications and Celiac blood testing.  Cc: Jolene Provost, MD

## 2010-08-19 NOTE — Progress Notes (Signed)
History of Present Illness: This is a 60 year old female followed by [facility] teaching service who has frequent diarrhea and crampy abdominal pain for several years. She previously underwent colonoscopy in September 2010 for similar symptoms and the colonoscopy was normal. She was recommended to use glycopyrrolate 2 mg twice daily at the time, but does not recall if she filled the prescription, or if she did, its efficacy. She states she has frequent loose, watery, urgent stools with crampy abdominal pain, particularly in the left upper quadrant, but occasionally it occurs in other areas of the abdomen. Her symptoms have improved with the use of hyoscyamine but they do not completely control her symptoms. She denies melena, hematochezia, weight loss, change in stool caliber.  Current Medications, Allergies, Past Medical History, Past Surgical History, Family History and Social History were reviewed in [employer] record.  Physical Exam: General: Well developed , well nourished, no acute distress. Morbidly obese.  Head: Normocephalic and atraumatic Eyes:  sclerae anicteric, EOMI Ears: Normal auditory acuity Mouth: No deformity or lesions Lungs: Clear throughout to auscultation Heart: Regular rate and rhythm; no murmurs, rubs or bruits Abdomen: Soft, minimal left upper quadrant tenderness, without rebound or guarding and non distended. No masses, hepatosplenomegaly or hernias noted. Normal bowel sounds Musculoskeletal: Symmetrical with no gross deformities  Pulses:  Normal pulses noted Extremities: No clubbing, cyanosis, edema or deformities noted Neurological: Alert oriented x 4, grossly nonfocal Psychological:  Alert and cooperative. Normal mood and affect  Assessment and Recommendations:  1. Irritable bowel syndrome. Rule out celiac disease and chronic intestinal infections. Rule out a component of lactose intolerance, fatty food intolerance or bacterial  overgrowth. Avoid lactose products and high fat foods and assess response. Blood work for celiac disease has been ordered but has not been completed. If her tTG is elevated proceed with further evaluation for celiac disease. If her celiac testing is negative, I will defer to her primary physician to obtain all standard stool studies including studies for O&P, enteric pathogens and fecal fat and I would consider an empiric trial of metronidazole for 7-10 days even if the stool studies are negative for possible bacterial overgrowth or giardiasis missed on stool studies.  She likely needs a long acting anti-spasmodic but is unsure which medications will be covered and so, again, I will defer to her primary physician to help her choose another anti-spasmodic. I would suggest glycopyrrolate 2 mg twice daily or hyoscyamine 0.375 mg twice daily and she may continue to use hyoscyamine 0.125 mg on an as-needed basis for breakthrough symptoms. In addition she may use Imodium twice daily as needed for diarrhea control. Finally consider the use of cholestyramine 1 to 3 times daily if the above measures are not effective in controlling her diarrhea.

## 2010-08-20 ENCOUNTER — Other Ambulatory Visit: Payer: Self-pay

## 2010-08-21 ENCOUNTER — Ambulatory Visit: Payer: Self-pay | Admitting: Gastroenterology

## 2010-08-21 LAB — TISSUE TRANSGLUTAMINASE, IGA: Tissue Transglutaminase Ab, IgA: 5.8 U/mL (ref <20)

## 2010-09-04 ENCOUNTER — Encounter: Payer: Self-pay | Admitting: Internal Medicine

## 2010-09-04 ENCOUNTER — Ambulatory Visit (INDEPENDENT_AMBULATORY_CARE_PROVIDER_SITE_OTHER): Payer: Self-pay | Admitting: Internal Medicine

## 2010-09-04 VITALS — BP 177/85 | HR 74 | Temp 97.9°F | Ht 60.0 in | Wt 269.2 lb

## 2010-09-04 DIAGNOSIS — R109 Unspecified abdominal pain: Secondary | ICD-10-CM

## 2010-09-04 DIAGNOSIS — G629 Polyneuropathy, unspecified: Secondary | ICD-10-CM

## 2010-09-04 DIAGNOSIS — G609 Hereditary and idiopathic neuropathy, unspecified: Secondary | ICD-10-CM

## 2010-09-04 DIAGNOSIS — E119 Type 2 diabetes mellitus without complications: Secondary | ICD-10-CM

## 2010-09-04 LAB — GLUCOSE, CAPILLARY: Glucose-Capillary: 207 mg/dL — ABNORMAL HIGH (ref 70–99)

## 2010-09-04 MED ORDER — INSULIN GLARGINE 100 UNIT/ML ~~LOC~~ SOLN: 60.0000 [IU] | Freq: Every day | SUBCUTANEOUS | Status: DC

## 2010-09-04 MED ORDER — AMITRIPTYLINE HCL 50 MG PO TABS: 25.0000 mg | ORAL_TABLET | Freq: Every day | ORAL | Status: DC

## 2010-09-04 MED ORDER — GABAPENTIN 300 MG PO CAPS: 300.0000 mg | ORAL_CAPSULE | Freq: Three times a day (TID) | ORAL | Status: DC

## 2010-09-04 MED ORDER — HYOSCYAMINE SULFATE 0.125 MG SL SUBL: 0.1250 mg | SUBLINGUAL_TABLET | SUBLINGUAL | Status: DC | PRN

## 2010-09-04 NOTE — Patient Instructions (Signed)
Please follow up in 3 months.  Please check blood sugars in the morning before breakfast, and at bedtime.  If your blood sugars are still running high, greater than 200, please contact the clinic for further instructions.  Please start taking Gabapentin for nerve pain related symptoms.  Please take all other medications as directed.  Please stop taking Metformin.

## 2010-09-06 DIAGNOSIS — G629 Polyneuropathy, unspecified: Secondary | ICD-10-CM | POA: Insufficient documentation

## 2010-09-06 NOTE — Assessment & Plan Note (Signed)
Likely secondary to IBS and DM with question of gastroparesis. Pt was referred to GI where she was evaluated for Celiac. TTG was negative. Per GI, patient's abdominal pain is likely multifactorial secondary to IBS and DM.  Plan: -Increase lantus to 60 units, for diabetic management -Continue hyoscyamine at current dose, as health dept will not pay for the increased dosage  -Stop Metformin as it is contributing to patient's current symptoms

## 2010-09-06 NOTE — Progress Notes (Signed)
  Subjective:    Patient ID: Kathleen [name], female    DOB: 1951/03/23, 60 y.o.   MRN: [id number]  HPI Kathleen [name] is a 60 year old woman with pmh significant for DM II, IBS, HLD, and degenerative joint disease presents to [facility] for general check-up and follow up regarding diabetes.  1) IBS - c/o of abdominal pain and cramping since November 2011. States she can hardly eat because of the pain, and that when she does eat, she will develop loose stools. She also complains of nausea, but no vomiting. Over the past few months, it had improved with stopping her Metformin, and hyoscyamine. She was referred to GI for further evaluation, where she had TTG checked for celiac disease and it was negative. GI thought her abdominal discomfort was likely secondary to diabetes.   2) DM - Her CBGs have been running high between 250-400. Her AM sugars usually run between 200-300, with pm sugars getting as high as 400. Her Lantus was decreased to 50 units as her A1C was 5.9. Pt complains of increased feet pain. She denies changes in vision. She has abd pain, and nausea as discussed above.   No other complaints or concerns. She denies chest pain, cough, sob, headache.    Review of Systems  All other systems reviewed and are negative.       Objective:   Physical Exam  Constitutional: She is oriented to person, place, and time. She appears well-developed.  HENT:  Head: Normocephalic.  Eyes: Pupils are equal, round, and reactive to light.  Neck: Normal range of motion. Neck supple.  Cardiovascular: Normal rate and regular rhythm.   Pulmonary/Chest: Effort normal and breath sounds normal.  Abdominal: Soft. Bowel sounds are normal. There is no tenderness. There is no guarding.  Musculoskeletal: Normal range of motion.  Neurological: She is alert and oriented to person, place, and time.          Assessment & Plan:

## 2010-09-06 NOTE — Assessment & Plan Note (Signed)
Likely secondary to DM. Pt states improvement with amitriptyline.  Plan: -Refill amitriptyline -Start gabapentin -Achieve better Diabetes control with Increasing lantus to 60 units

## 2010-09-06 NOTE — Assessment & Plan Note (Signed)
Lab Results  Component Value Date   HGBA1C 7.8 09/04/2010   HGBA1C 5.9 05/08/2010   CREATININE 0.94 05/22/2010   MICROALBUR 2.10* 05/08/2010   MICRALBCREAT 20.9 05/08/2010   CHOL 129 11/21/2009   HDL 34* 11/21/2009   TRIG 91 11/21/2009    Last eye exam and foot exam:    Component Value Date/Time   HMDIABEYEEXA mild non-proliferative diabetic retinopathy; OD visual acuity (best corrected) 20/40, intraocular pressure 18; OS visual acuity (best corrected) 20/60, intraocular pressure 19 09/19/2008   HMDIABFOOTEX done 05/22/2010    Assessment: Diabetes control: not controlled Progress toward goals: deteriorated Barriers to meeting goals: insulin dosing  Plan: Diabetes treatment: continue current medications Increase lantus dose from 50 to 60  units. Stop Metformin in setting of worsening abdominal pain, nausea, and vomiting.  Refer to: none Instruction/counseling given: reminded to get eye exam, reminded to bring blood glucose meter & log to each visit and other instruction/counseling: stop metformin

## 2010-09-09 NOTE — Discharge Summary (Signed)
Kathleen [name], Kathleen [name]                ACCOUNT NO.:  [id number]   MEDICAL RECORD NO.:  [id number]          PATIENT TYPE:  INP   LOCATION:  5008                         FACILITY:  [facility]   PHYSICIAN:  Thomes Lolling, M.D.    DATE OF BIRTH:  03/19/1951   DATE OF ADMISSION:  11/05/2007  DATE OF DISCHARGE:  11/11/2007                               DISCHARGE SUMMARY   DISCHARGE DIAGNOSES:  1. Methicillin-resistant Staphylococcus aureus superficial abscesses,      right scapular area and left upper extremity.  2. Type 2 diabetes mellitus.   DISCHARGE MEDICATIONS:  1. Lantus 60 units subcutaneously each night before bed.  2. Metformin 500 mg by mouth once in the morning before food and once      in the evening before eating food.  3. Doxycycline 100 mg by mouth twice daily for 7 days.   DISPOSITION AND FOLLOWUP:  Kathleen [name] was discharged to home in  hemodynamically stable condition.  Her back and left upper extremity  abscesses were both drained by surgery and were healing at the time of  discharge.  Her blood glucose remained elevated throughout her course,  but were under better control by the time of discharge.  She will follow  up with surgery at the [facility] on November 22, 2007 and with Dr. Coralie Carpen  at the [facility] on November 28, 2007.   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   CONSULTS:  None.   ADMISSION HISTORY AND PHYSICAL:  Kathleen [name] is a 60 year old female with  no past medical history who presents with complaints of worsening back  abscess of 1-week duration.  She believes something bit her 1 week ago,  but did not see a specific insect.  Her abscess became painful within a  day or 2 from first nursing it.  It grew to the point where her sister  insists that she  would to be seen by doctor.  At present, the area all  around the abscess is very painful and sore.  A second abscess is  developed similar to the first, but smaller on the inside of her left  upper arm.  This area is also extremely painful.  The patient also  describes flu-like symptoms for the past 2 weeks with nausea and  vomiting, headache, body ache symptom, and fevers.  These symptoms are  all improved over the last few days, but has not eaten anything all  weekend.  She has been able to drink mostly Gatorade and water.  She  describes feeling very thirsty, endorses recent weight loss, but denies  any increased urinary frequency, chest pain, shortness of breath,  abdominal pain or currently feeling fevers.  Of  note, she admits the  sister has history of MRSA infection.   ADMISSION VITALS:  Temperature 97.6, blood pressure 131/75, pulse 94,  respiratory rate 14 breaths per  minute, and O2 sat 98% on room air.  In  general, Kathleen [name] is an obese white female.  She was lying in bed on  her back and slight stress secondary to pain.  Her extraocular movements  were intact  and her pupils were equal, round, and reactive to light.  She was wearing dentures, had no notable oral lesions or thrush and no  erythema of the throats.  No carotid bruits were identified.  Her lungs  were clear to auscultation bilaterally.  No wheezes, rhonchi, or  crackles were heard.  Her heart exam revealed regular rate and rhythm  with a 2/6 decrescendo systolic murmur heard best at left sternal border  with no rubs or gallops.  Her abdominal exam revealed an obese, soft,  nontender, and nondistended abdomen with positive bowel sounds.  No  hepatomegaly was appreciated.  Her extremities were nonedematous and  noncyanotic.  She did have a 3-4 cm red raised lesion on the inner  portion of her left upper arm.  This was tender to palpation and raised  approximately 1 cm of the surface of the skin.  Her skin exam  revealed a  15-cm in diameter red raised lesion on her right scapula draining  purulent yellow fluid.  The bandage she had placed on this wound was wet  with pus.  It was extremely tender to palpation up to about 20-cm in  diameter.  She also had multiple 1-2 cm healing scabbed lesion on  bilateral lower extremities.  No inial rash was noted.  She had no  cervical lymphadenopathy.  She was moving all 4 extremities  spontaneously.  Her neuro exam revealed 5/5 strength in bilateral upper  and bilateral lower extremities.  She was alert and oriented x3.  She  had flow effect.   ADMISSION LABS:  White blood cell count 17,500 with left shift,  hemoglobin 14.4, hematocrit 42.8, platelets 259.  Sodium 131, potassium  3.1, chloride 96, bicarb 20, BUN 16, creatinine 1.13, and glucose 446.  PT 15.1, INR 1.2, PTT 30.  Her anion gap was 15, bilirubin 1.7, alk phos  113, AST is 12, ALT 13, total protein 6.8, albumin 2.1, and calcium 9.3.   HOSPITAL COURSE:  1. MRSA abscesses on right scapula and left upper extremity:  Ms.      [name] presented with 2 large abscesses.  At the time of admission,      one was on her right scapula, which was about 15-cm in diameter and      draining a purulent fluid while the other one was on her inner left      upper arm that was about 2-3 cm in diameter, was not draining any      fluid at that time.  The back abscess required prompt surgical I&D.      This was performed in the emergency department and approximately 20      mL of purulent fluid were drained.  She was placed on Ancef and      vancomycin for antimicrobial coverage.  On hospital day 3, the left      upper extremity abscess was also drained by surgery.  Cultures of      the abscess fluid revealed community-acquired MRSA, which was      sensitive to vancomycin, clindamycin, tetracycline, rifampin,      Bactrim, gentamicin, and levofloxacin.  Blood cultures remained      negative throughout her hospital  course.  Both wounds continued  to      heal well throughout her hospital course.  They remained bandaged      and were changed approximately 3 times a day and treated with      hydrotherapy.  Wound care instruction was given to her sister and      niece prior to discharge.  They will assist her with this.  She was      prescribed 7 days of doxycycline b.i.d. and will follow up with      surgery on November 22, 2007.  2. New diagnosis, type 2 diabetes mellitus:  At admission, Ms.      [name]'s capillary blood glucose were running in the 400s and 500s.      Likely this was exacerbated by her infection; however, it is likely      her sugars have been out of control for extended period of time.      Over her hospital course, her sugars were difficult to control      remaining around below 200s by the day discharge.  She was placed      on moderate scale coverage with short-acting insulin and basal      coverage with Lantus.  Her sliding scale was bumped up to the      resistance scale and her Lantus was slowly dosed up to 50 units      each night.  She was also started on metformin 500 mg twice daily      for overall control.  She received diabetes education regarding      checking her sugars and dosing her insulin after meals.  However,      she never became quite comfortable with this process throughout her      stay mainly because she had difficulty seeing the needle.  She was      discharged on 60 units of Lantus in the evening with metformin.      This was the regimen we believed was feasible for her and also      reasonable control until further followup.  She will return to the      [facility] for additional management.   DISCHARGE LABS:  Sodium 137, potassium 4.2, chloride 104, bicarb 27,  glucose 232, BUN 4, creatinine of 0.63, calcium 8.7, magnesium 1.6,  microalbumin creatinine urine ratio 21.7.  CBC revealed white blood cell  count 9.4, hemoglobin 12.5, hematocrit  36.3, and platelet count 233.  Total cholesterol 102, triglycerides 105, HDL cholesterol less than 10,  LDL could not be calculated.  Hemoglobin A1c 12.4.      Burton Apley, M.D.  Electronically Signed      Thomes Lolling, M.D.  Electronically Signed    MC/MEDQ  D:  11/11/2007  T:  11/12/2007  Job:  [id number]

## 2010-09-09 NOTE — Group Therapy Note (Signed)
NAME:  Kathleen [name], Kathleen [name] NO.:  [id number]   MEDICAL RECORD NO.:  [id number]          PATIENT TYPE:  WOC   LOCATION:  [facility]                   FACILITY:  [facility]   PHYSICIAN:  Laray Anger, MD        DATE OF BIRTH:  Sep 07, 1950   DATE OF SERVICE:  08/31/2008                                  CLINIC NOTE   This is a 60 year old gravida 3, para 3 who was seen for her annual  examination in November 2009.  At that point the patient had a  comprehensive annual examination, apart from a Pap smear, given that her  last Pap smear was in March 2009.  She was told to follow up for this  Pap smear when it is appropriate.  However, the examination during that  visit in November revealed a 2-cm tender, mobile cyst at the 11 and 2  o'clock position in the upper right breast.  She was scheduled for a  breast ultrasound.  The breast ultrasound shows no radiologic evidence  of malignancy and the patient was told to follow up with a screening  mammogram in May 2010, which the patient says that she is yet to do, but  does have it scheduled.  At that visit also the patient reported  postmenopausal bleeding and an endometrial biopsy was performed, which  was benign; proliferative endometrium, no hyperplasia, atypia or  malignancy identified.  The patient denies any further postmenopausal  bleeding.  She also denies any gynecologic concerns.   EXAMINATION:  Temperature 97.1, pulse 64, blood pressure 122/84,  respirations 18, weight 252.1 pounds, height 5 feet 1.  IN GENERAL:  No apparent distress.  ABDOMEN:  Obese, soft, nontender, nondistended.  PELVIC EXAM:  Normal external female genitalia.  Pink, well-rugated  vagina, normal discharge.  Multiparous cervical os noted.  Pap smear  sample obtained.  On bimanual exam, the patient has a small uterus.  Unable to palpate adnexa, secondary to patient's habitus.   ASSESSMENT/PLAN:  The patient is a 60 year old gravida 3, para 3, here  for  her annual Pap smear.  The patient already had her annual  gynecologic examination in February 2009.  I was unable to do the Pap  smear at that point, given that it was not the appropriate time interval  from her prior Pap smear.  The patient was told to expect a letter if  her Pap smear is normal, or a call if it is abnormal.  She was told to  follow up with her mammogram as scheduled to follow up her right breast  cyst and also to return to the gynecology clinic or call if she has any  further gynecologic concerns.           ______________________________  Laray Anger, MD     UD/MEDQ  D:  08/31/2008  T:  08/31/2008  Job:  [id number]

## 2010-09-09 NOTE — Group Therapy Note (Signed)
NAME:  JOYANN, TIVIS NO.:  [id number]   MEDICAL RECORD NO.:  [id number]          PATIENT TYPE:  WOC   LOCATION:  [facility]                   FACILITY:  [facility]   PHYSICIAN:  Nicoletta Dress, MD     DATE OF BIRTH:  11-29-1950   DATE OF SERVICE:                                  CLINIC NOTE   A 60 year old Hispanic female, gravida 3, para 3, living 2, last normal  menstrual period approximately 3 years ago, who was referred here for  evaluation of postmenopausal bleeding.   She stopped having her menstrual periods approximately 3 years ago.  After a year of amenorrhea, she began having occasional spotting.  She  states she has not had any spotting this past year.  Her last Pap smear  was in March 2009.  She believes it was okay.   Her last mammogram was in February and that she believes was normal.  However, she has had a cyst in the right upper breast present for 3-4  years and does not know what the mammogram actually showed.   MEDICATIONS:  Insulin and metformin.   SURGERY:  Cholecystectomy.   ALLERGIES:  None.   PHYSICAL EXAMINATION:  GENERAL:  Height 5 feet 1, weight 228, and blood  pressure 133/77.  NECK:  Thyroid is normal.  BREASTS:  Examination of the right breast reveals a 2-cm tender, mobile  cyst at approximately the 11 and 12 o'clock position in the upper  breast.  The rest of her breast exam and the exam of the left breast was  completely normal.  ABDOMEN:  Somewhat obese, soft, and nontender.  No masses.  PELVIC:  External genitalia, BUS glands are normal.  Vaginal wall was  epithelialized as was the cervix.  Cervix is epithelialized.  Uterus is  midline and is upper limits of normal size.  Her ovaries are not  palpable on either side.  No adnexal masses are noted.   Endometrial biopsy was performed.  The uterus sounded approximately 8  cm.  The patient tolerated that procedure well.   DIAGNOSES:  1. Postmenopausal bleeding.  2. Right  breast cyst.   DISPOSITION:  1. Endometrial biopsy.  2. I have recommended a breast ultrasound and she probably should see      a surgeon to consider removal of this      persistent cyst.  3. She will return in 2 weeks for results.           ______________________________  Nicoletta Dress, MD     ER/MEDQ  D:  03/16/2008  T:  03/16/2008  Job:  [id number]   cc:   Dr. Thomasene Ripple [facility]

## 2010-09-09 NOTE — Consult Note (Signed)
Kathleen [name], Kathleen [name]                ACCOUNT NO.:  [id number]   MEDICAL RECORD NO.:  [id number]          PATIENT TYPE:  INP   LOCATION:  5008                         FACILITY:  [facility]   PHYSICIAN:  Mare Loan, MD      DATE OF BIRTH:  July 11, 1950   DATE OF CONSULTATION:  11/05/2007  DATE OF DISCHARGE:                                 CONSULTATION   REASON FOR CONSULT:  Abscess on right posterior thorax.   HISTORY OF PRESENT ILLNESS:  [name] is a 60 year old female who has  had swelling on the right side of her back approximately 2 weeks.  She  states it has drained spontaneously and has somewhat diminished in size,  but overall remained painful, tender, and swollen.  She had associated  chills.  She came to the emergency department due to the swelling and  pain in her back.   PAST MEDICAL HISTORY:  Negative.   PAST SURGICAL HISTORY:  Cholecystectomy.   MEDICATIONS:  None.   ALLERGIES:  None.   SOCIAL HISTORY:  No tobacco use and currently unemployed.   REVIEW OF SYSTEMS:  Negative.   PHYSICAL EXAMINATION:  GENERAL:  She is lying in bed in no acute  distress.  VITAL SIGNS:  Temperature 97.1 and blood pressure 110/69.  FOCUSED EXAMINATION ON THE BACK:  Reveals a large area approximately 15  x 10 of swelling, erythema, and calor.  It is fluctuant.  There is some  spontaneous drainage noted from the area with palpation.   LABORATORY DATA:  White count 17.5, hemoglobin and hematocrit 14 and 42.  Serum chemistry reveals sodium 131, potassium 3.1, BUN and creatinine 15  and 1.2, and glucose 146.   ASSESSMENT AND PLAN:  Left back abscess which drained in the emergency  department after anesthetizing the area with 1% lidocaine.  A large  amount of fluid was expressed from the cavity with finger manipulation  within the cavity to break up loculations.  This was irrigated with  saline.  It was packed with 2 pieces of moist gauze and  recommend b.i.d. packing with gauze.  Shower  daily with removal of the  packing.  Cover if Gram-positive since it is likely a staph infection.  Cultures are pending currently.  We will reevaluate the wound tomorrow,  but likely nothing further to do other than continue with the packing.      Mare Loan, MD  Electronically Signed     ALA/MEDQ  D:  11/05/2007  T:  11/06/2007  Job:  [id number]

## 2010-09-09 NOTE — Op Note (Signed)
Kathleen [name], Kathleen [name]                ACCOUNT NO.:  [id number]   MEDICAL RECORD NO.:  [id number]          PATIENT TYPE:  INP   LOCATION:  5008                         FACILITY:  [facility] [facility]   PHYSICIAN:  Gwenyth Ober, M.D.    DATE OF BIRTH:  1950/06/06   DATE OF PROCEDURE:  11/07/2007  DATE OF DISCHARGE:                               OPERATIVE REPORT   PREOPERATIVE DIAGNOSIS:  Left medial biceps abscess.   POSTOPERATIVE DIAGNOSIS:  Left medial biceps abscess.   ANESTHETIC:  2% lidocaine infiltrated subdermally and subcutaneously.   TECHNIQUE:  Using Betadine solution, the skin over the abscess area in  the left medial biceps was certainly prepped.  Subsequently, the area  was infiltrated with 2% lidocaine as noted above until desired  anesthetic effect was obtained.  Next, utilizing a #11 blade, a 1.5-cm  linear incision was made with immediate return of yellow purulent blood  clot material, estimated about 15-20 mL out.  The wound was very  fluctuant prior to the I&D procedure.  Antral wound cultures were  obtained and utilizing hemostats, the epidermal skin was stretched to  make a larger opening to avoid an elliptical incision.  There was a  large cavity left after evacuation of the abscess.  This cavity was  explored with hemostats to make sure there was no remaining debris.  The  wound was subsequently packed with dry gauze and hemostasis was  adequately obtained and the wound was otherwise dressed in a sterile  fashion.  The patient tolerated the procedure well with minimal blood  loss.  Please note that the abscess itself was a 3-4 cm in diameter,  fluctuant, nondraining wound pre I&D.      [place] Lissa Merlin, N.P.       ______________________________  Gwenyth Ober, M.D.    ALE/MEDQ  D:  11/07/2007  T:  11/08/2007  Job:  [id number]

## 2010-09-16 ENCOUNTER — Telehealth: Payer: Self-pay | Admitting: *Deleted

## 2010-09-16 NOTE — Telephone Encounter (Signed)
Please clarify how long pt needs to take gabapentin 300 mg tab before increasing to 2 or 3 times daily to improve symptoms.

## 2010-09-16 NOTE — Telephone Encounter (Signed)
One week

## 2010-09-16 NOTE — Telephone Encounter (Signed)
Pharmacy informed.

## 2010-10-16 ENCOUNTER — Telehealth: Payer: Self-pay | Admitting: *Deleted

## 2010-10-16 NOTE — Telephone Encounter (Signed)
[facility] pharmacy notified to early dispense Amitriptyline per Dr. Nance Pew.

## 2010-10-16 NOTE — Telephone Encounter (Signed)
Amitriptyline 50 - take 1/2 talet at bedtime. Pt states she has been taking 1 tablet daily b/c she did not know she suppose to be taking 1/2 tablet.  So, now she is out of med.  Last refilled 09/10/10  #30 per [place].  [facility] pharmacy wants to know if they can dispense early? Thanks

## 2010-10-16 NOTE — Telephone Encounter (Signed)
Yes. Patient should begin taking correct dose.

## 2010-11-25 ENCOUNTER — Other Ambulatory Visit: Payer: Self-pay | Admitting: *Deleted

## 2010-11-25 DIAGNOSIS — E119 Type 2 diabetes mellitus without complications: Secondary | ICD-10-CM

## 2010-11-25 MED ORDER — INSULIN GLARGINE 100 UNIT/ML ~~LOC~~ SOLN: 60.0000 [IU] | Freq: Every day | SUBCUTANEOUS | Status: DC

## 2010-11-25 NOTE — Telephone Encounter (Signed)
Refill was faxed to the [facility].

## 2010-11-26 ENCOUNTER — Encounter: Payer: Self-pay | Admitting: Internal Medicine

## 2010-11-26 ENCOUNTER — Ambulatory Visit (INDEPENDENT_AMBULATORY_CARE_PROVIDER_SITE_OTHER): Payer: Self-pay | Admitting: Internal Medicine

## 2010-11-26 VITALS — BP 137/64 | HR 75 | Temp 98.1°F | Ht 61.0 in | Wt 271.7 lb

## 2010-11-26 DIAGNOSIS — E119 Type 2 diabetes mellitus without complications: Secondary | ICD-10-CM

## 2010-11-26 DIAGNOSIS — G609 Hereditary and idiopathic neuropathy, unspecified: Secondary | ICD-10-CM

## 2010-11-26 DIAGNOSIS — E785 Hyperlipidemia, unspecified: Secondary | ICD-10-CM

## 2010-11-26 DIAGNOSIS — G629 Polyneuropathy, unspecified: Secondary | ICD-10-CM

## 2010-11-26 LAB — BASIC METABOLIC PANEL
BUN: 18 mg/dL (ref 6–23)
Chloride: 104 mEq/L (ref 96–112)
Creat: 0.88 mg/dL (ref 0.50–1.10)

## 2010-11-26 LAB — LIPID PANEL
LDL Cholesterol: 77 mg/dL (ref 0–99)
Total CHOL/HDL Ratio: 3.4 Ratio
VLDL: 20 mg/dL (ref 0–40)

## 2010-11-26 NOTE — Assessment & Plan Note (Addendum)
Lab Results  Component Value Date   HGBA1C 7.2 11/26/2010   HGBA1C 5.9 05/08/2010   CREATININE 0.94 05/22/2010   MICROALBUR 2.10* 05/08/2010   MICRALBCREAT 20.9 05/08/2010   CHOL 129 11/21/2009   HDL 34* 11/21/2009   TRIG 91 11/21/2009    Last eye exam and foot exam:    Component Value Date/Time   HMDIABEYEEXA mild non-proliferative diabetic retinopathy; OD visual acuity (best corrected) 20/40, intraocular pressure 18; OS visual acuity (best corrected) 20/60, intraocular pressure 19 09/19/2008   HMDIABFOOTEX done 05/22/2010    Assessment: Diabetes control: controlled Progress toward goals: at goal Barriers to meeting goals: no barriers identified  Plan: Diabetes treatment: continue current medications Refer to: none Instruction/counseling given: reminded to bring blood glucose meter & log to each visit, reminded to bring medications to each visit, discussed foot care and discussed the need for weight loss Will check microalbumin/Cr ratio today We have provided the patient with 2 boxes of Lantus Flex pens today. Patient was on how to use these pains by Barnabas Harries. Patient will also go to the health Department and pick up another sample of lantus today.

## 2010-11-26 NOTE — Assessment & Plan Note (Signed)
Subjective:    Kathleen [name] is here for follow up of dyslipidemia.  Compliance with treatment has been excellent. The patient exercises intermittently.   Lab Review Lab Results  Component Value Date   CHOL 129 11/21/2009   CHOL 140 11/20/2008   CHOL 132 01/27/2008   TRIG 91 11/21/2009   TRIG 104 11/20/2008   TRIG 67 01/27/2008   HDL 34* 11/21/2009   HDL 39* 11/20/2008   HDL 41 01/27/2008   LDL 77   Assessment:    Dyslipidemia under excellent control.    Plan:    1. Continue dietary measures. 2. Continue regular exercise. 3. Lipid-lowering medications: None at present. Will consider if LDL > 100 on recheck..  4. Will check lipid panel today.

## 2010-11-26 NOTE — Progress Notes (Signed)
  Subjective:    Patient ID: Kathleen [name], female    DOB: Sep 09, 1950, 60 y.o.   MRN: [id number]  HPI Ms. Portilla is a 60 year old woman with past medical history significant for Type II DM, Neuropathy, and IBS who presents to the clinic for general follow-up.  #1 diabetes-patient reports taking 60 units of Lantus daily. Patient checks her blood sugars daily and the day range between 100 to 220s. Patient denies any hypoglycemic symptoms such as lightheadedness or dizziness. Other pertinent negatives include no nausea or vomiting, no abdominal pain, or frequent urination. Patient is running low on her Lantus. Patient receives her Lantus from the health department where Lantus is on back order. Patient is concerned that she will run out of her insulin.  #2 IBS-patient states that her symptoms have been better controlled and that diarrhea is improved. She is taking the hyoscyamine as directed. She denies abdominal pain or cramping.  Patient has no other complaints or concerns today. She denies chest pain, cough, sob, headache, N/V, changes in abdominal and urinary character.    Review of Systems  All other systems reviewed and are negative.       Objective:   Physical Exam  Constitutional: She is oriented to person, place, and time. She appears well-developed.  HENT:  Head: Normocephalic and atraumatic.  Right Ear: External ear normal.  Left Ear: External ear normal.  Mouth/Throat: Oropharynx is clear and moist.       Nasal turbinates inflamed  Eyes: Pupils are equal, round, and reactive to light.  Neck: Normal range of motion. Neck supple.  Cardiovascular: Normal rate and regular rhythm.   Pulmonary/Chest: Effort normal and breath sounds normal.  Abdominal: Soft. Bowel sounds are normal.  Musculoskeletal: Normal range of motion.  Neurological: She is alert and oriented to person, place, and time.  Psychiatric: She has a normal mood and affect. Her behavior is normal.            Assessment & Plan:

## 2010-11-26 NOTE — Assessment & Plan Note (Signed)
Patient still has complaints of tingling in her lower extremities related to neuropathy. However the symptoms are well-controlled with gabapentin. We'll continue gabapentin along with amitriptyline which seems to be helping patient with her symptoms.

## 2010-11-26 NOTE — Patient Instructions (Signed)
Please follow up in 3 months. Please take medications as prescribed.

## 2010-11-27 LAB — MICROALBUMIN / CREATININE URINE RATIO
Creatinine, Urine: 48.1 mg/dL
Microalb, Ur: 1.3 mg/dL (ref 0.00–1.89)

## 2011-01-13 ENCOUNTER — Other Ambulatory Visit: Payer: Self-pay | Admitting: *Deleted

## 2011-01-13 DIAGNOSIS — G629 Polyneuropathy, unspecified: Secondary | ICD-10-CM

## 2011-01-14 MED ORDER — GABAPENTIN 300 MG PO CAPS: 300.0000 mg | ORAL_CAPSULE | Freq: Three times a day (TID) | ORAL | Status: DC

## 2011-01-14 NOTE — Telephone Encounter (Signed)
Gabapentin refilled - rx request faxed to [place].

## 2011-01-22 LAB — POCT I-STAT, CHEM 8
BUN: 18
Creatinine, Ser: 0.7
Glucose, Bld: 511
Sodium: 129 — ABNORMAL LOW
TCO2: 18

## 2011-01-22 LAB — BASIC METABOLIC PANEL
BUN: 9
CO2: 22
CO2: 22
CO2: 24
Calcium: 8.3 — ABNORMAL LOW
Calcium: 8.6
Chloride: 104
Chloride: 104
Chloride: 106
Creatinine, Ser: 0.65
Creatinine, Ser: 0.73
GFR calc Af Amer: >60
GFR calc non Af Amer: >60
Glucose, Bld: 297 — ABNORMAL HIGH
Glucose, Bld: 308 — ABNORMAL HIGH
Potassium: 3.3 — ABNORMAL LOW
Potassium: 3.7
Potassium: 4
Potassium: 4.2
Sodium: 132 — ABNORMAL LOW
Sodium: 135
Sodium: 138

## 2011-01-22 LAB — HEMOGLOBIN A1C
Hgb A1c MFr Bld: 12.4 — ABNORMAL HIGH
Mean Plasma Glucose: 364

## 2011-01-22 LAB — CBC
HCT: 36.3
HCT: 40.2
HCT: 42.8
Hemoglobin: 12.5
Hemoglobin: 12.6
Hemoglobin: 13.9
Hemoglobin: 14.4
MCHC: 33.7
MCHC: 34.6
MCV: 88.2
MCV: 88.5
Platelets: 223
Platelets: 259
RBC: 4.84
RDW: 12.8
RDW: 13
WBC: 17.5 — ABNORMAL HIGH
WBC: 9.4

## 2011-01-22 LAB — WOUND CULTURE

## 2011-01-22 LAB — COMPREHENSIVE METABOLIC PANEL
ALT: 13
AST: 12
CO2: 20
Chloride: 96
Creatinine, Ser: 1.13
GFR calc Af Amer: >60
GFR calc non Af Amer: 50 — ABNORMAL LOW
Glucose, Bld: 446 — ABNORMAL HIGH
Sodium: 131 — ABNORMAL LOW
Total Bilirubin: 1.7 — ABNORMAL HIGH

## 2011-01-22 LAB — KETONES, QUALITATIVE

## 2011-01-22 LAB — CULTURE, BLOOD (ROUTINE X 2)

## 2011-01-22 LAB — DIFFERENTIAL
Basophils Absolute: 0
Eosinophils Relative: 0
Eosinophils Relative: 2
Lymphocytes Relative: 29
Lymphocytes Relative: 9 — ABNORMAL LOW
Lymphs Abs: 1.5
Lymphs Abs: 2.7
Monocytes Absolute: 0.8
Monocytes Relative: 5
Neutro Abs: 5.6

## 2011-01-22 LAB — CULTURE, ROUTINE-ABSCESS

## 2011-01-22 LAB — MICROALBUMIN / CREATININE URINE RATIO: Microalb, Ur: 1.14

## 2011-01-22 LAB — LIPID PANEL: Triglycerides: 105

## 2011-01-22 LAB — OSMOLALITY: Osmolality: 289

## 2011-01-22 LAB — VANCOMYCIN, TROUGH: Vancomycin Tr: 13.3

## 2011-01-23 LAB — BASIC METABOLIC PANEL
BUN: 4 — ABNORMAL LOW
BUN: 5 — ABNORMAL LOW
CO2: 27
Calcium: 8.6
Calcium: 8.7
Chloride: 106
Creatinine, Ser: 0.53
Creatinine, Ser: 0.63
GFR calc Af Amer: >60
GFR calc non Af Amer: >60
GFR calc non Af Amer: >60
Glucose, Bld: 190 — ABNORMAL HIGH
Glucose, Bld: 232 — ABNORMAL HIGH
Potassium: 4.1
Sodium: 138

## 2011-01-23 LAB — MAGNESIUM: Magnesium: 1.6

## 2011-02-12 ENCOUNTER — Ambulatory Visit (INDEPENDENT_AMBULATORY_CARE_PROVIDER_SITE_OTHER): Payer: Self-pay | Admitting: Internal Medicine

## 2011-02-12 ENCOUNTER — Encounter: Payer: Self-pay | Admitting: Internal Medicine

## 2011-02-12 ENCOUNTER — Ambulatory Visit (HOSPITAL_COMMUNITY)
Admission: RE | Admit: 2011-02-12 | Discharge: 2011-02-12 | Disposition: A | Discharge status: Home / Self Care | Payer: Self-pay | Source: Ambulatory Visit | Attending: Internal Medicine | Admitting: Internal Medicine

## 2011-02-12 ENCOUNTER — Other Ambulatory Visit: Payer: Self-pay | Admitting: Internal Medicine

## 2011-02-12 VITALS — BP 156/74 | HR 67 | Temp 97.1°F | Ht 61.0 in | Wt 275.0 lb

## 2011-02-12 DIAGNOSIS — M25511 Pain in right shoulder: Secondary | ICD-10-CM | POA: Insufficient documentation

## 2011-02-12 DIAGNOSIS — M25519 Pain in unspecified shoulder: Secondary | ICD-10-CM

## 2011-02-12 DIAGNOSIS — E119 Type 2 diabetes mellitus without complications: Secondary | ICD-10-CM

## 2011-02-12 DIAGNOSIS — I10 Essential (primary) hypertension: Secondary | ICD-10-CM | POA: Insufficient documentation

## 2011-02-12 DIAGNOSIS — Z1231 Encounter for screening mammogram for malignant neoplasm of breast: Secondary | ICD-10-CM

## 2011-02-12 DIAGNOSIS — M19019 Primary osteoarthritis, unspecified shoulder: Secondary | ICD-10-CM | POA: Insufficient documentation

## 2011-02-12 DIAGNOSIS — Z23 Encounter for immunization: Secondary | ICD-10-CM

## 2011-02-12 DIAGNOSIS — Z0000 Encounter for general adult medical examination without abnormal findings: Secondary | ICD-10-CM | POA: Insufficient documentation

## 2011-02-12 MED ORDER — LISINOPRIL 20 MG PO TABS: 10.0000 mg | ORAL_TABLET | Freq: Every day | ORAL | Status: DC

## 2011-02-12 MED ORDER — HYDROCODONE-ACETAMINOPHEN 5-500 MG PO TABS: 2.0000 | ORAL_TABLET | Freq: Four times a day (QID) | ORAL | Status: DC | PRN

## 2011-02-12 NOTE — Assessment & Plan Note (Signed)
Flu shot today Schedule mammogram Schedule Eye exam

## 2011-02-12 NOTE — Assessment & Plan Note (Signed)
Lab Results  Component Value Date   HGBA1C 7.2 11/26/2010   HGBA1C 5.9 05/08/2010   CREATININE 0.88 11/26/2010   CREATININE 0.94 05/22/2010   MICROALBUR 1.30 11/26/2010   MICRALBCREAT 27.0 11/26/2010   CHOL 137 11/26/2010   HDL 40 11/26/2010   TRIG 99 11/26/2010    Last eye exam and foot exam:    Component Value Date/Time   HMDIABEYEEXA mild non-proliferative diabetic retinopathy; OD visual acuity (best corrected) 20/40, intraocular pressure 18; OS visual acuity (best corrected) 20/60, intraocular pressure 19 09/19/2008   HMDIABFOOTEX done 05/22/2010    Assessment: Diabetes control: not controlled Progress toward goals: unchanged Barriers to meeting goals: no barriers identified  Plan: Diabetes treatment: continue current medications Will check A1C at next visit. If unchanged or deteriorated, will start pt on SSI. Pt agreeable to this plan. Refer to: none Instruction/counseling given: reminded to bring blood glucose meter & log to each visit, discussed the need for weight loss and discussed diet

## 2011-02-12 NOTE — Assessment & Plan Note (Signed)
Lab Results  Component Value Date   NA 140 11/26/2010   K 4.5 11/26/2010   CL 104 11/26/2010   CO2 23 11/26/2010   BUN 18 11/26/2010   CREATININE 0.88 11/26/2010   CREATININE 0.94 05/22/2010    BP Readings from Last 3 Encounters:  02/12/11 156/74  11/26/10 137/64  09/04/10 177/85    Assessment: Hypertension control:  mildly elevated  Progress toward goals:  unchanged Barriers to meeting goals:  no barriers identified  Plan: Hypertension treatment:  Last three readings have been above goal for this diabetic patient. BP is also likely elevated secondary to pain. Given hx of diabetes, will start patient on ACE-I which will also slow the progression diabetic nephropathy. Will start patient on Lisinopril 10 mg po qday, and have patient follow up in 2 weeks to check renal function, and BP.

## 2011-02-12 NOTE — Progress Notes (Signed)
  Subjective:    Patient ID: Kathleen [name], female    DOB: 07-23-1950, 60 y.o.   MRN: [id number]  HPI Kathleen [name] is a 60 year old woman with pmh significant for DM, Dyslipidemia, IBS, and neuropathy who presents today for the following:  1.) DM - brought her blood sugar meter log and AM sugars range between 80-150, while PM sugars range between 200-300. Patient states she has been taking her lantus 60 units daily, but has noticed her sugars to be rising. No hypoglycemic episodes.   2.) Right shoulder pain - Right shoulder pain which began in August, but has progressively worsened over the last couple of months. Patient is unable to lift arm above her head, as well as move arm back. Pain is in shoulder and radiates down arm, and decreased grip strength. Associated with numbness and tingling in fingers, but worse in thumb. Worse with movement.   3.) Neuropathy - has been taking Gabapentin and pt states it is helping.   Patient has no other complaints or concerns today. Denies chest pain, cough, sob, nausea, or vomiting, no changes in bowel or urinary character.   Review of Systems  All other systems reviewed and are negative.       Objective:   Physical Exam  Constitutional: She appears well-developed.  HENT:  Head: Normocephalic.  Eyes: Pupils are equal, round, and reactive to light.  Neck: Normal range of motion. Neck supple.  Cardiovascular: Normal rate and regular rhythm.   Pulmonary/Chest: Effort normal and breath sounds normal.  Abdominal: Soft.  Musculoskeletal:       Decreased range of motion right shoulder.           Assessment & Plan:

## 2011-02-12 NOTE — Assessment & Plan Note (Addendum)
Adhesive capsulitis vs rotator cuff tendonitis/tendonopathy. Will get plain film to rule out other causes such as dislocation, and evaluate for OA. Give physical exam findings of limited abduction and rotation, and inability to place hand on back, it is more likely adhesive capsulitis. Plan: Prescribed Vicodin for pain control - Plain films - Sports medicine referral

## 2011-02-12 NOTE — Patient Instructions (Addendum)
Please follow up in 2-4 weeks.  Please get xray of shoulder.  Please continue to take all medications as prescribed.  You have been prescribed Vicodin for shoulder pain. This medication may cause drowsiness so please use caution when operating a motor vehicle. Please get mammogram and eye exam as scheduled.

## 2011-02-18 ENCOUNTER — Ambulatory Visit (INDEPENDENT_AMBULATORY_CARE_PROVIDER_SITE_OTHER): Payer: Self-pay | Admitting: Sports Medicine

## 2011-02-18 ENCOUNTER — Encounter: Payer: Self-pay | Admitting: Sports Medicine

## 2011-02-18 VITALS — BP 160/86 | HR 67

## 2011-02-18 DIAGNOSIS — M25519 Pain in unspecified shoulder: Secondary | ICD-10-CM

## 2011-02-18 DIAGNOSIS — M25511 Pain in right shoulder: Secondary | ICD-10-CM

## 2011-02-18 MED ORDER — MELOXICAM 15 MG PO TABS: ORAL_TABLET | ORAL | Status: DC

## 2011-02-18 MED ORDER — CYCLOBENZAPRINE HCL 5 MG PO TABS: ORAL_TABLET | ORAL | Status: DC

## 2011-02-18 NOTE — Assessment & Plan Note (Addendum)
Suggestive of acromioclavicular DJD. I suspect that the numbness and tingling in her fingers are do to compression of the brachial plexus nerves at the scalenes due to favoring the shoulder and developing spasm. With a negative Spurling sign I do not suspect that this is related to foraminal compression at the spine. Good range of motion not suggestive of adhesive capsulitis. Ultrasound guided injection as above. Meloxicam 15 mg daily for 2 weeks then as needed. Flexeril at bedtime, and a 3 times a day for a short course. She'll come back to see [place] in 3 weeks.

## 2011-02-18 NOTE — Progress Notes (Signed)
  Subjective:    Patient ID: Kathleen [name], female    DOB: July 28, 1950, 60 y.o.   MRN: [id number]  HPI Right shoulder pain: Present for approximately 2 months, worsening, worse with overhead motions, as well as reaching across chest. She does note some pain and spasm in her neck and also notes that her fingers are feeling some tingling. She localizes the pain over the right acromioclavicular joint. Her pain is sharp. She notes no popping, catching, locking, clicking. She was seen in the internal medicine center, given some Vicodin and sent here for further evaluation.  Of note I reviewed her x-rays independently, they show significant acromioclavicular DJD.  Past medical history: Diabetes mellitus type 2, hyperlipidemia, hypertension, irritable bowel, obesity. Past surgical history: Cholecystectomy Social history: No alcohol, tobacco, or drugs. Family history:  diabetes Allergies: Doxycycline  Review of Systems    no fevers, chills, night sweats, weight loss. Objective:   Physical Exam General: Well-developed, obese female in no acute distress. Neuro: Alert and oriented x3, extraocular muscles intact. Skin: Warm and dry. Respiratory: Not using accessory muscles. Right Shoulder: Inspection reveals no abnormalities, atrophy or asymmetry. Palpation with significant tenderness over the a.c. Joint, positive cross arm test and this reproduces her symptoms. ROM 45 external rotation 90 flexion 45 extension 80 abduction. Rotator cuff strength normal throughout. Positive Neer, positive empty can, negative Hawkins. Speeds and Yergason's tests normal. No labral pathology noted with negative Obrien's, negative clunk and good stability. No painful arc and no drop arm sign. No apprehension sign  MSK ultrasound: We imaged the acromioclavicular joint, there were significant DJD, as well as effusion in the a.c. capsule.  Real-time Ultrasound Guided Injection of: Right acromioclavicular  joint Consent obtained. Time-out conducted. Noted no overlying erythema, induration, or other signs of local infection. Prepped in a sterile fashion with alcohol/betadine. Local anesthesia: None With sterile technique and under real time ultrasound guidance: The needle was inserted easily into the distant did acromioclavicular capsule, medication was injected easily, and video was taken out of the needle in the capsule. Completed without difficulty Meds:  0.5 cc Depo-Medrol 40, 0.5 cc lidocaine 1%. Needle: 25 g Pain immediately resolved. Advised to call if fevers/chills, erythema, induration, drainage, or persistent bleeding.    Assessment & Plan:

## 2011-02-18 NOTE — Patient Instructions (Signed)
Acromioclavicular arthritis We injected your a.c. joint. Come back to see [place] in 3 weeks to reassess her symptoms. Use the Flexeril and the meloxicam as directed.  Gwen Her. Dianah Field, M.D. [facility] [address], [place] [address] [phone]     Acromioclavicular Injuries The [facility] (acromioclavicular) joint is the joint in the shoulder where the collarbone (clavicle) meets the shoulder blade (scapula). The part of the shoulder blade connected to the collarbone is called the acromion. Common problems with and treatments for the [facility] joint are detailed below. ARTHRITIS Arthritis occurs when the joint has been injured and the smooth padding between the joints (cartilage) is lost. This is the wear and tear seen in most joints of the body if they have been overused. This causes the joint to produce pain and swelling which is worse with activity.   AC JOINT SEPARATION AC joint separation means that the ligaments connecting the acromion of the shoulder blade and collarbone have been damaged, and the two bones no longer line up. AC separations can be anywhere from mild to severe, and are "graded" depending upon which ligaments are torn and how badly they are torn.  Grade I Injury: the least damage is done, and the [facility] joint still lines up.     Grade II Injury: damage to the ligaments which reinforce the [facility] joint. In a Grade II injury, these ligaments are stretched but not entirely torn. When stressed, the [facility] joint becomes painful and unstable.     Grade III Injury: AC and secondary ligaments are completely torn, and the collarbone is no longer attached to the shoulder blade. This results in deformity; a prominence of the end of the clavicle.  AC JOINT FRACTURE AC joint fracture means that there has been a break in the bones of the [facility] joint, usually the end of the clavicle. TREATMENT TREATMENT OF AC ARTHRITIS  There is currently no way to replace the cartilage  damaged by arthritis. The best way to improve the condition is to decrease the activities which aggravate the problem. Application of ice to the joint helps decrease pain and soreness (inflammation). The use of non-steroidal anti-inflammatory medication is helpful.     If less conservative measures do not work, then cortisone shots (injections) may be used. These are anti-inflammatories; they decrease the soreness in the joint and swelling.     If non-surgical measures fail, surgery may be recommended. The procedure is generally removal of a portion of the end of the clavicle. This is the part of the collarbone closest to your acromion which is stabilized with ligaments to the acromion of the shoulder blade. This surgery may be performed using a tube-like instrument with a light (arthroscope) for looking into a joint. It may also be performed as an open surgery through a small incision by the surgeon. Most patients will have good range of motion within 6 weeks and may return to all activity including sports by 8-12 weeks, barring complications.  TREATMENT OF AN AC SEPARATION  The initial treatment is to decrease pain. This is best accomplished by immobilizing the arm in a sling and placing an ice pack to the shoulder for 20 to 30 minutes every 2 hours as needed. As the pain starts to subside, it is important to begin moving the fingers, wrist, elbow and eventually the shoulder in order to prevent a stiff or "frozen" shoulder. Instruction on when and how much to move the shoulder will be provided by your  caregiver. The length of time needed to regain full motion and function depends on the amount or grade of the injury. Recovery from a Grade I AC separation usually takes 10 to 14 days, whereas a Grade III may take 6 to 8 weeks.     Grade I and II separations usually do not require surgery. Even Grade III injuries usually allow return to full activity with few restrictions. Treatment is also based on the  activity demands of the injured shoulder. For example, a high level quarterback with an injured throwing arm will receive more aggressive treatment than someone with a desk job who rarely uses his/her arm for strenuous activities. In some cases, a painful lump may persist which could require a later surgery. Surgery can be very successful, but the benefits must be weighed against the potential risks.  TREATMENT OF AN AC JOINT FRACTURE Fracture treatment depends on the type of fracture. Sometimes a splint or sling may be all that is required. Other times surgery may be required for repair. This is more frequently the case when the ligaments supporting the clavicle are completely torn. Your caregiver will help you with these decisions and together you can decide what will be the best treatment. HOME CARE INSTRUCTIONS    Apply ice to the injury for 15 to 20 minutes each hour while awake for 2 days. Put the ice in a plastic bag and place a towel between the bag of ice and skin.     If a sling has been applied, wear it constantly for as long as directed by your caregiver, even at night. The sling or splint can be removed for bathing or showering or as directed. Be sure to keep the shoulder in the same place as when the sling is on. Do not lift the arm.     If a figure-of-eight splint has been applied it should be tightened gently by another person every day. Tighten it enough to keep the shoulders held back. Allow enough room to place the index finger between the body and strap. Loosen the splint immediately if there is numbness or tingling in the hands.     Take over-the-counter or prescription medicines for pain, discomfort or fever as directed by your caregiver.     If you or your child has received a follow up appointment, it is very important to keep that appointment in order to avoid long term complications, chronic pain or disability.  SEEK MEDICAL CARE IF:    The pain is not relieved with  medications.     There is increased swelling or discoloration that continues to get worse rather than better.     You or your child has been unable to follow up as instructed.     There is progressive numbness and tingling in the arm, forearm or hand.  SEEK IMMEDIATE MEDICAL CARE IF:    The arm is numb, cold or pale.     There is increasing pain in the hand, forearm or fingers.  MAKE SURE YOU:    Understand these instructions.     Will watch your condition.     Will get help right away if you are not doing well or get worse.  Document Released: 01/21/2005 Document Revised: 12/24/2010 Document Reviewed: 07/16/2008 [facility] Patient Information 2012 [place].

## 2011-03-04 ENCOUNTER — Ambulatory Visit (HOSPITAL_COMMUNITY)
Admission: RE | Admit: 2011-03-04 | Discharge: 2011-03-04 | Disposition: A | Discharge status: Home / Self Care | Payer: Self-pay | Source: Ambulatory Visit | Attending: Internal Medicine | Admitting: Internal Medicine

## 2011-03-04 DIAGNOSIS — Z1231 Encounter for screening mammogram for malignant neoplasm of breast: Secondary | ICD-10-CM | POA: Insufficient documentation

## 2011-03-05 ENCOUNTER — Ambulatory Visit (INDEPENDENT_AMBULATORY_CARE_PROVIDER_SITE_OTHER): Payer: Self-pay | Admitting: Internal Medicine

## 2011-03-05 ENCOUNTER — Encounter: Payer: Self-pay | Admitting: Internal Medicine

## 2011-03-05 VITALS — BP 125/67 | HR 69 | Temp 97.4°F | Ht 61.0 in | Wt 276.0 lb

## 2011-03-05 DIAGNOSIS — M25519 Pain in unspecified shoulder: Secondary | ICD-10-CM

## 2011-03-05 DIAGNOSIS — E119 Type 2 diabetes mellitus without complications: Secondary | ICD-10-CM

## 2011-03-05 DIAGNOSIS — Z23 Encounter for immunization: Secondary | ICD-10-CM

## 2011-03-05 DIAGNOSIS — I10 Essential (primary) hypertension: Secondary | ICD-10-CM

## 2011-03-05 DIAGNOSIS — M25511 Pain in right shoulder: Secondary | ICD-10-CM

## 2011-03-05 LAB — GLUCOSE, CAPILLARY: Glucose-Capillary: 82 mg/dL (ref 70–99)

## 2011-03-05 LAB — POCT GLYCOSYLATED HEMOGLOBIN (HGB A1C): Hemoglobin A1C: 7

## 2011-03-05 MED ORDER — CYCLOBENZAPRINE HCL 5 MG PO TABS: ORAL_TABLET | ORAL | Status: DC

## 2011-03-05 NOTE — Assessment & Plan Note (Signed)
Will defer to sports medicine for further management of this problem. Patient may need physical therapy which will be discussed at her next visit with sports medicine. Will refill her Flexeril for now. Patient was advised to continue with meloxicam for pain and inflammation.

## 2011-03-05 NOTE — Assessment & Plan Note (Signed)
Lab Results  Component Value Date   HGBA1C 7.0 03/05/2011   HGBA1C 5.9 05/08/2010   CREATININE 0.88 11/26/2010   CREATININE 0.94 05/22/2010   MICROALBUR 1.30 11/26/2010   MICRALBCREAT 27.0 11/26/2010   CHOL 137 11/26/2010   HDL 40 11/26/2010   TRIG 99 11/26/2010   A1C in August 2012 7.27 Aug 2010          7.8  Last eye exam and foot exam:    Component Value Date/Time   HMDIABEYEEXA mild non-proliferative diabetic retinopathy; OD visual acuity (best corrected) 20/40, intraocular pressure 18; OS visual acuity (best corrected) 20/60, intraocular pressure 19 09/19/2008   HMDIABFOOTEX done 05/22/2010    Assessment: Diabetes control: controlled Progress toward goals: improved Barriers to meeting goals: no barriers identified  Plan: Diabetes treatment: continue current medications Refer to: none Instruction/counseling given: reminded to bring blood glucose meter & log to each visit, reminded to bring medications to each visit and discussed the need for weight loss

## 2011-03-05 NOTE — Progress Notes (Signed)
  Subjective:    Patient ID: Kathleen [name], female    DOB: Nov 11, 1950, 60 y.o.   MRN: [id number]  HPI Kathleen [name] is a 60 year old woman with past medical history significant for diabetes, dyslipidemia, obesity, degenerative joint disease of the right shoulder and hypertension who presents for followup regarding shoulder pain and diabetes.  #1 diabetes-patient states that she has been exercising 6 days a week. She states that she [place]. She is checking her CBGs which actually have improved. Her CBGs ranged from 80-150. Patient did bring her meter which corresponds with the values that she mentions. She denies any hypoglycemic symptoms such as dizziness or lightheadedness. She also denies abdominal pain, nausea, vomiting, polyuria or polydipsia.  #2 right shoulder pain-patient was recently evaluated by a sports medicine who performed a bedside ultrasound and reviewed her x-rays. Based on both of these imaging studies the patient has extensive degenerative joint disease of the [facility] joint. She was prescribed Flexeril as well as meloxicam for additional pain and inflammation. Patient was also given a steroid injection on day of appointment which she states helped for a couple of weeks however the pain has returned and is quite sharp in nature.  No other complaints or concerns today.    Review of Systems  All other systems reviewed and are negative.       Objective:   Physical Exam  HENT:  Head: Normocephalic.  Eyes: Pupils are equal, round, and reactive to light.  Neck: Normal range of motion.  Cardiovascular: Normal rate and regular rhythm.   Pulmonary/Chest: Effort normal and breath sounds normal.  Abdominal: Soft. Bowel sounds are normal.  Musculoskeletal:       Decreased range of motion secondary to pain in the right shoulder and arm          Assessment & Plan:

## 2011-03-05 NOTE — Patient Instructions (Signed)
Please follow up in 3 months. Please follow up with Sports Medicine as scheduled.  Please continue to take all medications as directed.

## 2011-03-05 NOTE — Assessment & Plan Note (Signed)
Lab Results  Component Value Date   NA 140 11/26/2010   K 4.5 11/26/2010   CL 104 11/26/2010   CO2 23 11/26/2010   BUN 18 11/26/2010   CREATININE 0.88 11/26/2010   CREATININE 0.94 05/22/2010    BP Readings from Last 3 Encounters:  03/05/11 125/67  02/18/11 160/86  02/12/11 156/74    Assessment: Hypertension control:  controlled  Progress toward goals:  at goal Barriers to meeting goals:  no barriers identified  Plan: Hypertension treatment:  continue current medications

## 2011-03-11 ENCOUNTER — Encounter: Payer: Self-pay | Admitting: Sports Medicine

## 2011-03-11 ENCOUNTER — Ambulatory Visit (INDEPENDENT_AMBULATORY_CARE_PROVIDER_SITE_OTHER): Payer: Self-pay | Admitting: Sports Medicine

## 2011-03-11 VITALS — BP 167/76 | HR 70 | Ht 61.0 in | Wt 276.0 lb

## 2011-03-11 DIAGNOSIS — M25511 Pain in right shoulder: Secondary | ICD-10-CM

## 2011-03-11 DIAGNOSIS — M25519 Pain in unspecified shoulder: Secondary | ICD-10-CM

## 2011-03-11 MED ORDER — NITROGLYCERIN 0.2 MG/HR TD PT24: MEDICATED_PATCH | TRANSDERMAL | Status: DC

## 2011-03-11 NOTE — Progress Notes (Signed)
  Subjective:    Patient ID: Kathleen [name], female    DOB: 02-Jul-1950, 60 y.o.   MRN: [id number]  HPI  Right shoulder pain: Present for approximately 2.5 months, pain localized to [facility] joint.  Injected acromioclavicular joint at last visit, lasted about 2 weeks or so.  Pain present in the same location but now also over deltoid.  Worse with overhead motions.  No radiation, slightly better with meloxicam.   Review of Systems     no fevers, chills, night sweats, weight loss. Objective:   Physical Exam  General: Well-developed, obese female in no acute distress. Neuro: Alert and oriented x3, extraocular muscles intact. Skin: Warm and dry. Respiratory: Not using accessory muscles. Right Shoulder: Inspection reveals no abnormalities, atrophy or asymmetry. Palpation with significant tenderness over the a.c. Joint, positive cross arm test and this reproduces her symptoms. ROM 45 external rotation 90 flexion 45 extension 80 abduction. Rotator cuff strength weak to supraspinatus. Positive Neer, positive empty can, negative Hawkins. Speeds and Yergason's tests normal. No labral pathology noted with negative Obrien's, negative clunk and good stability. No painful arc and no drop arm sign. No apprehension sign  Complete MSK US performed of: Right shoulder.  R Shoulder:   Supraspinatus:  Some calcific tendinopathy noted, no tears seen, no bursal bulge seen with shoulder abduction on impingement view. Infraspinatus:  Appears normal on long and transverse views. Subscapularis:  Appears normal on long and transverse views. Teres Minor:  Appears normal on long and transverse views. AC joint:  Capsule distended, positive geyser sign. Biceps Tendon:  Appears normal on long and transverse views, no fraying of tendon, tendon located in intertubercular groove, no subluxation with shoulder internal or external rotation. No increased power doppler signal.   Consent obtained and verified. Time-out  conducted. Noted no overlying erythema, induration, or other signs of local infection. Sterile betadine prep. Furthur cleansed with alcohol. Topical analgesic spray: Ethyl chloride. Joint: Right subacromial. Approached in typical fashion with: Posterior approach. Completed without difficulty Meds: 1 cc Depo-Medrol 40, 4 cc lidocaine 1% no epi. Advised to call if fevers/chills, erythema, induration, drainage, or persistent bleeding.    Assessment & Plan:  1.  right shoulder pain: At this point it looks like combination of acromioclavicular DJD, as well as rotator cuff syndrome. We injected her subacromial bursa today. We will also use nitroglycerin one quarter patch daily. I would also like to start her on the [place] exercises. She'll come back to see [place] in a period of 4 weeks to see how she's doing.

## 2011-03-25 ENCOUNTER — Telehealth: Payer: Self-pay | Admitting: *Deleted

## 2011-03-25 MED ORDER — QUINAPRIL HCL 20 MG PO TABS: 20.0000 mg | ORAL_TABLET | Freq: Every day | ORAL | Status: DC

## 2011-03-25 NOTE — Telephone Encounter (Signed)
New rx for Accupril called to [facility].

## 2011-03-25 NOTE — Telephone Encounter (Signed)
Pt is taking Lisinopril 20mg  1/2 tab daily. [facility] states they can get Accupril 20mg  at no charge,if you consider changing. If so, need new rx.  Thanks

## 2011-04-06 ENCOUNTER — Telehealth: Payer: Self-pay | Admitting: *Deleted

## 2011-04-06 NOTE — Telephone Encounter (Signed)
CALLED AND LEFT VOICE MESSAGE FOR PATIENT TO RETURN CALL TO [facility].  TRYING TO SCHEDULE APPT WITH [facility] ON January 9, 012. Hady Niemczyk NTII  12-10-012 12:14PM

## 2011-04-08 ENCOUNTER — Ambulatory Visit (INDEPENDENT_AMBULATORY_CARE_PROVIDER_SITE_OTHER): Payer: Self-pay | Admitting: Sports Medicine

## 2011-04-08 VITALS — BP 128/80

## 2011-04-08 DIAGNOSIS — M25519 Pain in unspecified shoulder: Secondary | ICD-10-CM

## 2011-04-08 DIAGNOSIS — M25511 Pain in right shoulder: Secondary | ICD-10-CM

## 2011-04-08 MED ORDER — NAPROXEN 500 MG PO TABS: 500.0000 mg | ORAL_TABLET | Freq: Two times a day (BID) | ORAL | Status: DC

## 2011-04-08 NOTE — Patient Instructions (Addendum)
MRI R shoulder is scheduled for Monday, December the 17th at [place] at [facility]. Their phone number is [phone] if you have questions about the appt.  Call and make appt to see me after MRI.  Gwen Her. Dianah Field, M.D. [facility] [address] [place] [address] [phone]

## 2011-04-08 NOTE — Progress Notes (Signed)
  Subjective:    Patient ID: Kathleen [name], female    DOB: 10/14/1950, 60 y.o.   MRN: [id number]  HPI Jiya returns for followup of her right shoulder pain. She is a referral to the [facility] internal medicine residency. At the initial visit we performed an ultrasound guided injection of her acromioclavicular joint, which resolved all of her symptoms for approximately 2 weeks. The next visit her pain and return, and we did a subacromial injection as well as nitroglycerin patches which did not work that well.  He is also been doing the [place] exercises. She returns today somewhat worse. X-rays in the past showed progressive acromioclavicular DJD. An ultrasound confirmed the same but also shows some calcific tendinopathy. Currently not using a meloxicam.  She does note an occasional twinge of pain in her pinky finger, this is not associated with movements of the neck, and this is not her principal complaint.   Review of Systems    no fevers, chills, night sweats, weight loss. Objective:   Physical Exam  General: Well-developed, obese female in no acute distress. Neuro: Alert and oriented x3, extraocular muscles intact. Skin: Warm and dry. Respiratory: Not using accessory muscles. Right Shoulder: Inspection reveals no abnormalities, atrophy or asymmetry. Palpation with significant tenderness over the a.c. Joint, positive cross arm test and this reproduces her symptoms. ROM 45 external rotation 90 flexion 45 extension 80 abduction. Rotator cuff strength normal throughout. Positive Neer, positive empty can, negative Hawkins. Speeds and Yergason's tests normal. No labral pathology noted with negative Obrien's, negative clunk and good stability. No painful arc and no drop arm sign. No apprehension sign     Assessment & Plan:   1. right shoulder pain: Again suggestive of acromioclavicular degenerative joint disease, I do not believe her principal pathology she her is cervical  radiculopathy for the mere fact that her pain completely resolved after acromioclavicular injection. Currently his she's failed home exercise program, oral NSAIDs, and an injection, I would like to MRI her shoulder to assess for further pathology. Should this again reveal severe a.c. DJD, we can refer her to either [facility] or [facility] for distal clavicular excision. She will come back to see me after her MRI to go over the results.

## 2011-04-10 ENCOUNTER — Other Ambulatory Visit: Payer: Self-pay | Admitting: *Deleted

## 2011-04-10 DIAGNOSIS — G629 Polyneuropathy, unspecified: Secondary | ICD-10-CM

## 2011-04-13 ENCOUNTER — Ambulatory Visit (HOSPITAL_COMMUNITY)
Admission: RE | Admit: 2011-04-13 | Discharge: 2011-04-13 | Disposition: A | Discharge status: Home / Self Care | Payer: Self-pay | Source: Ambulatory Visit | Attending: Sports Medicine | Admitting: Sports Medicine

## 2011-04-13 DIAGNOSIS — M75100 Unspecified rotator cuff tear or rupture of unspecified shoulder, not specified as traumatic: Secondary | ICD-10-CM | POA: Insufficient documentation

## 2011-04-13 DIAGNOSIS — M249 Joint derangement, unspecified: Secondary | ICD-10-CM | POA: Insufficient documentation

## 2011-04-13 DIAGNOSIS — M25519 Pain in unspecified shoulder: Secondary | ICD-10-CM | POA: Insufficient documentation

## 2011-04-13 DIAGNOSIS — IMO0002 Reserved for concepts with insufficient information to code with codable children: Secondary | ICD-10-CM | POA: Insufficient documentation

## 2011-04-13 DIAGNOSIS — M19019 Primary osteoarthritis, unspecified shoulder: Secondary | ICD-10-CM | POA: Insufficient documentation

## 2011-04-13 DIAGNOSIS — M25511 Pain in right shoulder: Secondary | ICD-10-CM

## 2011-04-13 MED ORDER — GABAPENTIN 300 MG PO CAPS: 300.0000 mg | ORAL_CAPSULE | Freq: Three times a day (TID) | ORAL | Status: DC

## 2011-04-16 NOTE — Telephone Encounter (Signed)
Called to pharm 

## 2011-05-13 ENCOUNTER — Ambulatory Visit (INDEPENDENT_AMBULATORY_CARE_PROVIDER_SITE_OTHER): Payer: Self-pay | Admitting: Sports Medicine

## 2011-05-13 VITALS — BP 154/80

## 2011-05-13 DIAGNOSIS — M25519 Pain in unspecified shoulder: Secondary | ICD-10-CM

## 2011-05-13 DIAGNOSIS — M25511 Pain in right shoulder: Secondary | ICD-10-CM

## 2011-05-13 NOTE — Progress Notes (Signed)
  Subjective:    Patient ID: Kathleen [name], female    DOB: Sep 13, 1950, 61 y.o.   MRN: [id number]  HPI Liyla is here for followup of her MRI results. Been seeing her for right shoulder pain, I performed a acromioclavicular joint injection, subacromial injection. She's been through formal physical therapy. None of this has helped her pain for more than a few days. MRI was obtained which shows a retracted partial with partial thickness tear of the supraspinatus tendon. I do not see any fatty atrophy of the rotator cuff muscles on my own review of the MRI.   Review of Systems    No fevers, chills, night sweats, weight loss, chest pain, or shortness of breath.  Objective:   Physical Exam  General:  Well developed, well nourished, and in no acute distress. Neuro:  Alert and oriented x3, extra-ocular muscles intact. Skin: Warm and dry, no rashes noted.  MRI right shoulder: IMPRESSION:  1. Anterior insertional supraspinatus tendon tear extending to  articular surface. The tear measures 18 mm in width with 14 mm of  retraction.  2. Moderate AC joint osteoarthritis. Subacromial bursitis.      Assessment & Plan:

## 2011-05-13 NOTE — Assessment & Plan Note (Addendum)
Failed subacromial injection. Failed acromioclavicular injection. Failed physical therapy. MRI with a partially retracted partial thickness partial with tear of the supraspinatus. A.c. DJD. Referral to orthopedic surgery for shoulder arthroscopy plus/minus distal clavicular excision.

## 2011-05-13 NOTE — Patient Instructions (Signed)
Great to see you Kathleen [name],  We are going to refer you to orthopedic surgery, most likely [facility] in [place]. Come back to see [place] after you have seen them.   Gwen Her. Dianah Field, M.D. [place] [address] [place] [address] [phone]

## 2011-05-28 ENCOUNTER — Other Ambulatory Visit: Payer: Self-pay | Admitting: *Deleted

## 2011-05-28 ENCOUNTER — Encounter: Payer: Self-pay | Admitting: Internal Medicine

## 2011-05-28 ENCOUNTER — Ambulatory Visit (INDEPENDENT_AMBULATORY_CARE_PROVIDER_SITE_OTHER): Payer: Self-pay | Admitting: Internal Medicine

## 2011-05-28 VITALS — BP 167/79 | HR 73 | Temp 97.5°F | Ht 61.0 in | Wt 282.9 lb

## 2011-05-28 DIAGNOSIS — K589 Irritable bowel syndrome without diarrhea: Secondary | ICD-10-CM

## 2011-05-28 DIAGNOSIS — Z0000 Encounter for general adult medical examination without abnormal findings: Secondary | ICD-10-CM

## 2011-05-28 DIAGNOSIS — G629 Polyneuropathy, unspecified: Secondary | ICD-10-CM

## 2011-05-28 DIAGNOSIS — E119 Type 2 diabetes mellitus without complications: Secondary | ICD-10-CM

## 2011-05-28 DIAGNOSIS — R05 Cough: Secondary | ICD-10-CM

## 2011-05-28 DIAGNOSIS — M25519 Pain in unspecified shoulder: Secondary | ICD-10-CM

## 2011-05-28 DIAGNOSIS — I10 Essential (primary) hypertension: Secondary | ICD-10-CM

## 2011-05-28 DIAGNOSIS — M25511 Pain in right shoulder: Secondary | ICD-10-CM

## 2011-05-28 LAB — GLUCOSE, CAPILLARY: Glucose-Capillary: 75 mg/dL (ref 70–99)

## 2011-05-28 MED ORDER — CYCLOBENZAPRINE HCL 5 MG PO TABS: ORAL_TABLET | ORAL | Status: DC

## 2011-05-28 MED ORDER — GABAPENTIN 300 MG PO CAPS: 300.0000 mg | ORAL_CAPSULE | Freq: Three times a day (TID) | ORAL | Status: DC

## 2011-05-28 MED ORDER — GUAIFENESIN-CODEINE 100-10 MG/5ML PO SOLN: 5.0000 mL | Freq: Three times a day (TID) | ORAL | Status: AC | PRN

## 2011-05-28 MED ORDER — LOPERAMIDE HCL 2 MG PO TABS: 2.0000 mg | ORAL_TABLET | Freq: Four times a day (QID) | ORAL | Status: AC | PRN

## 2011-05-28 MED ORDER — QUINAPRIL HCL 20 MG PO TABS: 40.0000 mg | ORAL_TABLET | Freq: Every day | ORAL | Status: DC

## 2011-05-28 NOTE — Assessment & Plan Note (Signed)
Patient has been seen by GI in the past for this problem. Patient was recommended to start on anti-spasmodic. Patient is already on hyoscyamine. Patient was suggested a new medication called glycopyrrolate however could not take due to affordability. Suspect that her IBS has been exacerbated by recent stress and anxiety with her upcoming shoulder procedure. Patient was advised to take Imodium along with her hyoscyamine to help with symptom control. If this is a prolonged course of diarrhea will further evaluate.

## 2011-05-28 NOTE — Progress Notes (Signed)
Subjective:     Patient ID: Kathleen [name], female   DOB: 05-03-1950, 61 y.o.   MRN: [id number]  HPI Kathleen [name] is a pleasant 61 -year-old woman with past medical history significant for type 2 diabetes, irritable bowel syndrome and right shoulder cuff tear who presents to the clinic for the following:  1.) DM - checks her sugars 2-3 times daily. Her a.m. sugars range between 100-200. Her lunch and p.m. sugars are also within the same range. Patient denies any hypoglycemic symptoms such as dizziness, lightheadedness. Patient has been taking her Lantus as directed. Patient is concern due to weight gain. Patient states that her level of activity has significantly decreased since the onset of her shoulder and ankle pain.  2.) Right shoulder pain - patient has been followed by sports medicine for this problem. Patient has tried conservative therapy with shoulder injections however pain persists. Patient plans on seeing orthopedic surgery at [facility] for shoulder arthroscopy.  3.) IBS - reports loose stools more than usual. Patient was getting an MRI which she had an accident with diarrhea. Patient is still taking hyoscyamine 3-4 times daily. She reports that her increased number of stools may be due to anxiety of her upcoming shoulder surgery.  No other complaints or concerns today. Review of Systems  All other systems reviewed and are negative.       Objective:   Physical Exam  Constitutional: She is oriented to person, place, and time. She appears well-developed.  HENT:  Head: Normocephalic.  Eyes: Pupils are equal, round, and reactive to light.  Neck: Normal range of motion.  Cardiovascular: Normal rate and regular rhythm.   Pulmonary/Chest: Effort normal and breath sounds normal.  Abdominal: Soft. Bowel sounds are normal.  Musculoskeletal:       Foot exam - thickened toe nails No lesions/ulcers noted Significant sensory neuropathy bilaterally  Neurological: She is alert and oriented  to person, place, and time.

## 2011-05-28 NOTE — Assessment & Plan Note (Signed)
Lab Results  Component Value Date   NA 140 11/26/2010   K 4.5 11/26/2010   CL 104 11/26/2010   CO2 23 11/26/2010   BUN 18 11/26/2010   CREATININE 0.88 11/26/2010   CREATININE 0.94 05/22/2010    BP Readings from Last 3 Encounters:  05/28/11 167/79  05/13/11 154/80  04/08/11 128/80    Assessment: Hypertension control:  mildly elevated  Progress toward goals:  deteriorated Barriers to meeting goals:  no barriers identified  Plan: Hypertension treatment:  continue current medications Will increase ACE-I to 40 mg Check BMET today

## 2011-05-28 NOTE — Patient Instructions (Signed)
Please follow up in 3 months. Please continue to take all medications as directed.  Please take Immodium as directed.

## 2011-05-28 NOTE — Assessment & Plan Note (Signed)
Patient reports cough. Cough is dry. Patient reports being sick with an upper respiratory illness a few weeks ago. She suspects that the cough is lingering from her cold. Will prescribe patient Cheratussin and if cough continues and is of the same nature suspect that he may be related to ACE inhibitor. We'll follow up at next visit.

## 2011-05-28 NOTE — Assessment & Plan Note (Addendum)
Lab Results  Component Value Date   HGBA1C 6.7 05/28/2011   HGBA1C 5.9 05/08/2010   CREATININE 0.88 11/26/2010   CREATININE 0.94 05/22/2010   MICROALBUR 1.30 11/26/2010   MICRALBCREAT 27.0 11/26/2010   CHOL 137 11/26/2010   HDL 40 11/26/2010   TRIG 99 11/26/2010    Last eye exam and foot exam:    Component Value Date/Time   HMDIABEYEEXA mild non-proliferative diabetic retinopathy; OD visual acuity (best corrected) 20/40, intraocular pressure 18; OS visual acuity (best corrected) 20/60, intraocular pressure 19 09/19/2008   HMDIABFOOTEX done 05/22/2010  Eye exam recently completed.   Assessment: Diabetes control: controlled Progress toward goals: improved Barriers to meeting goals: no barriers identified  Plan: Diabetes treatment: continue current medications Refer to: none Instruction/counseling given: reminded to bring blood glucose meter & log to each visit, reminded to bring medications to each visit, discussed foot care, discussed the need for weight loss and discussed diet   Patient with significant sensory neuropathy of her feet. She was advised to pay close attention to her feet and to examine him daily for any cuts, lesions, ulcers or hangnails. Patient advised not to cut her own toenails

## 2011-05-29 LAB — BASIC METABOLIC PANEL
Calcium: 8.7 mg/dL (ref 8.4–10.5)
Glucose, Bld: 79 mg/dL (ref 70–99)
Sodium: 143 mEq/L (ref 135–145)

## 2011-05-29 NOTE — Telephone Encounter (Signed)
Empty refill note

## 2011-06-01 ENCOUNTER — Telehealth: Payer: Self-pay | Admitting: *Deleted

## 2011-06-01 NOTE — Telephone Encounter (Signed)
Called pharmacy

## 2011-06-01 NOTE — Telephone Encounter (Signed)
Pt has been taking 10mg  of lisinopril at HS also she has been taking 20 of accupril daily. You increased the accupril but did you mean for the pt to be taking lisinopril? If you would like to speak to the pharmacy please call [phone] and ask for crystal  Thanks, h.

## 2011-06-02 ENCOUNTER — Telehealth: Payer: Self-pay | Admitting: *Deleted

## 2011-06-02 DIAGNOSIS — I10 Essential (primary) hypertension: Secondary | ICD-10-CM

## 2011-06-02 NOTE — Telephone Encounter (Signed)
Call from [place] at [facility] asking for clarification on number of Accupril tabs on last Rx. Pt is to take accupril 20 mg two tabs a day and you only wrote for # 30 instead of # 60 I had them change to # 60 a month but wanted to check with you.  Is this correct? Can you change in EPIC?

## 2011-06-03 MED ORDER — QUINAPRIL HCL 20 MG PO TABS: 40.0000 mg | ORAL_TABLET | Freq: Every day | ORAL | Status: DC

## 2011-06-04 DIAGNOSIS — M7521 Bicipital tendinitis, right shoulder: Secondary | ICD-10-CM | POA: Insufficient documentation

## 2011-06-04 DIAGNOSIS — M75100 Unspecified rotator cuff tear or rupture of unspecified shoulder, not specified as traumatic: Secondary | ICD-10-CM | POA: Insufficient documentation

## 2011-06-18 DIAGNOSIS — I10 Essential (primary) hypertension: Secondary | ICD-10-CM | POA: Insufficient documentation

## 2011-07-02 DIAGNOSIS — S43439A Superior glenoid labrum lesion of unspecified shoulder, initial encounter: Secondary | ICD-10-CM | POA: Insufficient documentation

## 2011-08-26 ENCOUNTER — Encounter: Payer: Self-pay | Admitting: Internal Medicine

## 2011-08-26 ENCOUNTER — Ambulatory Visit (INDEPENDENT_AMBULATORY_CARE_PROVIDER_SITE_OTHER): Payer: Self-pay | Admitting: Internal Medicine

## 2011-08-26 VITALS — BP 164/73 | HR 78 | Temp 97.5°F | Ht 61.0 in | Wt 281.0 lb

## 2011-08-26 DIAGNOSIS — G629 Polyneuropathy, unspecified: Secondary | ICD-10-CM

## 2011-08-26 DIAGNOSIS — Z79899 Other long term (current) drug therapy: Secondary | ICD-10-CM

## 2011-08-26 DIAGNOSIS — E119 Type 2 diabetes mellitus without complications: Secondary | ICD-10-CM

## 2011-08-26 DIAGNOSIS — M25511 Pain in right shoulder: Secondary | ICD-10-CM

## 2011-08-26 DIAGNOSIS — I10 Essential (primary) hypertension: Secondary | ICD-10-CM

## 2011-08-26 LAB — GLUCOSE, CAPILLARY: Glucose-Capillary: 186 mg/dL — ABNORMAL HIGH (ref 70–99)

## 2011-08-26 LAB — POCT GLYCOSYLATED HEMOGLOBIN (HGB A1C): Hemoglobin A1C: 7.5

## 2011-08-26 MED ORDER — INSULIN GLARGINE 100 UNIT/ML ~~LOC~~ SOLN: 65.0000 [IU] | Freq: Every day | SUBCUTANEOUS | Status: DC

## 2011-08-26 MED ORDER — GABAPENTIN 300 MG PO CAPS: 300.0000 mg | ORAL_CAPSULE | Freq: Three times a day (TID) | ORAL | Status: DC

## 2011-08-26 MED ORDER — QUINAPRIL HCL 40 MG PO TABS: 40.0000 mg | ORAL_TABLET | Freq: Every day | ORAL | Status: DC

## 2011-08-26 MED ORDER — HYDROCHLOROTHIAZIDE 12.5 MG PO CAPS: 12.5000 mg | ORAL_CAPSULE | Freq: Every day | ORAL | Status: DC

## 2011-08-26 MED ORDER — CYCLOBENZAPRINE HCL 5 MG PO TABS: ORAL_TABLET | ORAL | Status: DC

## 2011-08-26 MED ORDER — AMITRIPTYLINE HCL 50 MG PO TABS: 25.0000 mg | ORAL_TABLET | Freq: Every day | ORAL | Status: DC

## 2011-08-26 MED ORDER — HYDROCODONE-ACETAMINOPHEN 5-500 MG PO TABS: 2.0000 | ORAL_TABLET | Freq: Four times a day (QID) | ORAL | Status: DC | PRN

## 2011-08-26 NOTE — Patient Instructions (Signed)
Please start taking 65 units of lantus at night.  Please check your blood sugars 3 times a day for the next 2 weeks.  Please start taking Hydrochlorothiazide 12.5 mg once daily. This medication is a water pill, which will help your blood pressure. Please follow up in 2 weeks for blood pressure recheck and blood work.

## 2011-08-26 NOTE — Progress Notes (Signed)
Subjective:     Patient ID: Kathleen [name], female   DOB: 1950/10/14, 61 y.o.   MRN: [id number]  HPI Kathleen [name] is a 61 year old woman with history of DM II, Neuropathy and recent rotator cuff surgery who presents for the following:  1) DM II - CBGs have been running between 180-300. AM sugars run between 150-220. Pt complains of increased urination. Patient reports it may be related to recent flu-like illness. No dizziness, lightheadedness or hypoglycemic episodes.   2) Neuropathy - Takes amitriptyline, and neurontin for pain, which helps most of the time.   3) IBS -no recent flares.  Patient has no other complaints or concerns today. She denies chest pain, cough, sob, headache, N/V, changes in abdominal and urinary character.   Review of Systems  All other systems reviewed and are negative.       Objective:   Physical Exam  Constitutional: She appears well-developed.  HENT:  Head: Normocephalic.  Eyes: Pupils are equal, round, and reactive to light.  Neck: Normal range of motion. Neck supple.  Cardiovascular: Normal rate and regular rhythm.   Pulmonary/Chest: Effort normal and breath sounds normal.  Abdominal: Soft.  Musculoskeletal:       Trace bilateral edema  Neurological: She is alert.  Psychiatric: She has a normal mood and affect.

## 2011-08-26 NOTE — Assessment & Plan Note (Signed)
Lab Results  Component Value Date   HGBA1C 7.5 08/26/2011   HGBA1C 5.9 05/08/2010   CREATININE 0.77 05/28/2011   CREATININE 0.94 05/22/2010   MICROALBUR 1.30 11/26/2010   MICRALBCREAT 27.0 11/26/2010   CHOL 137 11/26/2010   HDL 40 11/26/2010   TRIG 99 11/26/2010    Last eye exam and foot exam:    Component Value Date/Time   HMDIABEYEEXA mild non-proliferative diabetic retinopathy; OD visual acuity (best corrected) 20/40, intraocular pressure 18; OS visual acuity (best corrected) 20/60, intraocular pressure 19 09/19/2008   HMDIABFOOTEX done 05/22/2010    Assessment: Diabetes control: controlled Progress toward goals: deteriorated Barriers to meeting goals: recent shoulder surgery  Plan: Diabetes treatment: continue current medications will increase lantus to 65 units as AM blood sugars between 170-250.  Refer to: none Instruction/counseling given: reminded to bring blood glucose meter & log to each visit, reminded to bring medications to each visit, discussed the need for weight loss and discussed diet

## 2011-08-26 NOTE — Assessment & Plan Note (Signed)
Lab Results  Component Value Date   NA 143 05/28/2011   K 3.8 05/28/2011   CL 109 05/28/2011   CO2 24 05/28/2011   BUN 16 05/28/2011   CREATININE 0.77 05/28/2011   CREATININE 0.94 05/22/2010    BP Readings from Last 3 Encounters:  08/26/11 164/73  05/28/11 167/79  05/13/11 154/80    Assessment: Hypertension control:  mildly elevated  Progress toward goals:  deteriorated Barriers to meeting goals:  no barriers identified  Plan: Hypertension treatment:  continue current medications Will start HCTZ 12.5 mg po qday, and have patient return in 2 weeks for BP recheck and bmet

## 2011-08-28 ENCOUNTER — Other Ambulatory Visit: Payer: Self-pay | Admitting: *Deleted

## 2011-08-28 DIAGNOSIS — E119 Type 2 diabetes mellitus without complications: Secondary | ICD-10-CM

## 2011-08-28 NOTE — Telephone Encounter (Signed)
Dr Ina Homes - can you clarification dosage? Rx says 65 units and also 60 units -can u change on pt's med list. Thanks

## 2011-08-31 ENCOUNTER — Other Ambulatory Visit: Payer: Self-pay | Admitting: Internal Medicine

## 2011-08-31 DIAGNOSIS — E119 Type 2 diabetes mellitus without complications: Secondary | ICD-10-CM

## 2011-08-31 MED ORDER — INSULIN GLARGINE 100 UNIT/ML ~~LOC~~ SOLN: 65.0000 [IU] | Freq: Every day | SUBCUTANEOUS | Status: DC

## 2011-08-31 NOTE — Telephone Encounter (Signed)
See Orders Only for 65 inits - called to [facility] - per Dr Ina Homes.

## 2011-09-09 ENCOUNTER — Ambulatory Visit (INDEPENDENT_AMBULATORY_CARE_PROVIDER_SITE_OTHER): Payer: Medicare Other | Admitting: Internal Medicine

## 2011-09-09 ENCOUNTER — Encounter: Payer: Self-pay | Admitting: Internal Medicine

## 2011-09-09 VITALS — BP 89/67 | HR 84 | Temp 97.0°F | Ht 60.0 in | Wt 264.8 lb

## 2011-09-09 DIAGNOSIS — E875 Hyperkalemia: Secondary | ICD-10-CM

## 2011-09-09 DIAGNOSIS — N289 Disorder of kidney and ureter, unspecified: Secondary | ICD-10-CM

## 2011-09-09 DIAGNOSIS — I10 Essential (primary) hypertension: Secondary | ICD-10-CM

## 2011-09-09 DIAGNOSIS — E119 Type 2 diabetes mellitus without complications: Secondary | ICD-10-CM

## 2011-09-09 DIAGNOSIS — E785 Hyperlipidemia, unspecified: Secondary | ICD-10-CM

## 2011-09-09 LAB — GLUCOSE, CAPILLARY: Glucose-Capillary: 103 mg/dL — ABNORMAL HIGH (ref 70–99)

## 2011-09-09 NOTE — Assessment & Plan Note (Signed)
CBGs improved with increased lantus dose. Patient also lost 16 lbs which also contributing to better controlled CBGs. No hypoglycemic episodes. Repeat A1C in 2.5 months.

## 2011-09-09 NOTE — Assessment & Plan Note (Signed)
Lab Results  Component Value Date   CHOL 137 11/26/2010   HDL 40 11/26/2010   LDLCALC 77 11/26/2010   TRIG 99 11/26/2010   CHOLHDL 3.4 11/26/2010   LDL at control, will check lipid profile at next visit.

## 2011-09-09 NOTE — Patient Instructions (Signed)
Please take 1/2 tablet of Hydrochlorothiazide the water pill every other day. Please stop entirely if you are too dizzy. Please follow up in 2.5 months.

## 2011-09-09 NOTE — Assessment & Plan Note (Addendum)
Lab Results  Component Value Date   NA 143 05/28/2011   K 3.8 05/28/2011   CL 109 05/28/2011   CO2 24 05/28/2011   BUN 16 05/28/2011   CREATININE 0.77 05/28/2011   CREATININE 0.94 05/22/2010    BP Readings from Last 3 Encounters:  09/09/11 95/62  08/26/11 164/73  05/28/11 167/79  Lying 115/68 Sitting 105/69 Standing 89/67  Assessment: Hypertension control:  controlled but low, and causing orthostatic hypotension  Progress toward goals:  at goal Barriers to meeting goals:  no barriers identified  Plan: Hypertension treatment:  continue current medications Patient really likes the effects of HCTZ because she has lost 15 lbs in 2 weeks, and is able to exercise more and eating healthy. Have advised patient to take every other day.  Have advised to stop taking all together if she is extremely dizzy, and take with food.

## 2011-09-09 NOTE — Progress Notes (Signed)
Patient ID: Kathleen [name], female   DOB: [birth year], 61 y.o.   MRN: [id number] Subjective:     Diabetes  Fall   Kathleen [name] is a 61 year old woman with history of DM II, HTN, Neuropathy and recent rotator cuff surgery who presents for the following:  1) DM II - CBGs have been running between 120-180. AM sugars run between 150-180. She has improved her diet significantly, and has lost approximately 15 lbs in two weeks with just exercise and diet alone.   2) HTN - Was recently started on HCTZ 12.5 mg qday which patient states has helped tremendously. She occasionally feels dizzy when standing, and is wondering if it is lower her pressure too much, but she would like to continue it.   3) IBS -no recent flares.  Patient has no other complaints or concerns today. She denies chest pain, cough, sob, headache, N/V, changes in abdominal and urinary character.   Review of Systems  All other systems reviewed and are negative.       Objective:   Physical Exam  Constitutional: She appears well-developed.  HENT:  Head: Normocephalic.  Eyes: Pupils are equal, round, and reactive to light.  Neck: Normal range of motion. Neck supple.  Cardiovascular: Normal rate and regular rhythm.   Pulmonary/Chest: Effort normal and breath sounds normal.  Abdominal: Soft.  Musculoskeletal: She exhibits no edema.  Neurological: She is alert.  Psychiatric: She has a normal mood and affect.

## 2011-09-10 ENCOUNTER — Other Ambulatory Visit: Payer: Medicare Other

## 2011-09-10 DIAGNOSIS — E875 Hyperkalemia: Secondary | ICD-10-CM

## 2011-09-10 DIAGNOSIS — N289 Disorder of kidney and ureter, unspecified: Secondary | ICD-10-CM | POA: Insufficient documentation

## 2011-09-10 LAB — BASIC METABOLIC PANEL
BUN: 31 mg/dL — ABNORMAL HIGH (ref 6–23)
BUN: 41 mg/dL — ABNORMAL HIGH (ref 6–23)
CO2: 21 mEq/L (ref 19–32)
CO2: 24 mEq/L (ref 19–32)
Calcium: 9.9 mg/dL (ref 8.4–10.5)
Chloride: 100 mEq/L (ref 96–112)
Glucose, Bld: 95 mg/dL (ref 70–99)
Potassium: 5.9 mEq/L — ABNORMAL HIGH (ref 3.5–5.3)
Potassium: 5.2 mEq/L (ref 3.5–5.3)
Sodium: 136 mEq/L (ref 135–145)

## 2011-09-10 NOTE — Assessment & Plan Note (Signed)
May be in setting of starting HCTZ in combination with ACE-I. Have told patient to stop both ACE-I and HCTZ and will recheck blood work in 2 weeks.

## 2011-09-10 NOTE — Assessment & Plan Note (Addendum)
Potassium 5.9 with no evident hemolysis. Called patient and she denies chest pain, palpitations, however reports her legs kept jerking. Patient recently started on HCTZ 2 weeks ago, making hyperkalemia abnormal. Renal function has also deteriorated.  Plan: -Called patient to repeat blood work. She said she will try to come in today if she can arrange for transportation. -D/C HCTZ -Hold ACE-I and follow up in 2 weeks, repeat blood work  BMET    Component Value Date/Time   NA 134* 09/10/2011 1120   K 5.2 09/10/2011 1120   CL 100 09/10/2011 1120   CO2 24 09/10/2011 1120   GLUCOSE 117* 09/10/2011 1120   BUN 41* 09/10/2011 1120   CREATININE 1.38* 09/10/2011 1120   CREATININE 0.94 05/22/2010 2101   CALCIUM 10.0 09/10/2011 1120   GFRNONAA >60 11/11/2007 0445   GFRAA  Value: >60        The eGFR has been calculated using the MDRD equation. This calculation has not been validated in all clinical 11/11/2007 0445    Repeat potassium still high normal. Please refer to A/P for details

## 2011-09-25 ENCOUNTER — Encounter: Payer: Self-pay | Admitting: Internal Medicine

## 2011-09-25 ENCOUNTER — Other Ambulatory Visit (HOSPITAL_COMMUNITY)
Admission: RE | Admit: 2011-09-25 | Discharge: 2011-09-25 | Disposition: A | Discharge status: Home / Self Care | Payer: Medicare Other | Source: Ambulatory Visit | Attending: Internal Medicine | Admitting: Internal Medicine

## 2011-09-25 ENCOUNTER — Ambulatory Visit (INDEPENDENT_AMBULATORY_CARE_PROVIDER_SITE_OTHER): Payer: Medicare Other | Admitting: Internal Medicine

## 2011-09-25 VITALS — BP 116/67 | HR 78 | Temp 98.7°F | Ht 60.0 in | Wt 274.7 lb

## 2011-09-25 DIAGNOSIS — I10 Essential (primary) hypertension: Secondary | ICD-10-CM

## 2011-09-25 DIAGNOSIS — Z0000 Encounter for general adult medical examination without abnormal findings: Secondary | ICD-10-CM

## 2011-09-25 DIAGNOSIS — E875 Hyperkalemia: Secondary | ICD-10-CM

## 2011-09-25 DIAGNOSIS — N289 Disorder of kidney and ureter, unspecified: Secondary | ICD-10-CM

## 2011-09-25 DIAGNOSIS — Z124 Encounter for screening for malignant neoplasm of cervix: Secondary | ICD-10-CM | POA: Insufficient documentation

## 2011-09-25 DIAGNOSIS — E785 Hyperlipidemia, unspecified: Secondary | ICD-10-CM

## 2011-09-25 LAB — BASIC METABOLIC PANEL
BUN: 20 mg/dL (ref 6–23)
Calcium: 9.3 mg/dL (ref 8.4–10.5)
Chloride: 102 mEq/L (ref 96–112)
Creat: 1 mg/dL (ref 0.50–1.10)

## 2011-09-25 LAB — LIPID PANEL
Cholesterol: 115 mg/dL (ref 0–200)
HDL: 32 mg/dL — ABNORMAL LOW (ref >39)
LDL Cholesterol: 69 mg/dL (ref 0–99)
Triglycerides: 69 mg/dL (ref <150)

## 2011-09-25 NOTE — Assessment & Plan Note (Signed)
Lab Results  Component Value Date   NA 134* 09/10/2011   K 5.2 09/10/2011   CL 100 09/10/2011   CO2 24 09/10/2011   BUN 41* 09/10/2011   CREATININE 1.38* 09/10/2011   CREATININE 0.94 05/22/2010    BP Readings from Last 3 Encounters:  09/25/11 116/67  09/09/11 89/67  08/26/11 164/73    Assessment: Hypertension control:  controlled  Progress toward goals:  at goal Barriers to meeting goals:  no barriers identified  Plan: Hypertension treatment:  Will continue to hold ACE-I and will discontinue HCTZ for good considering elevated renal function. At next visit if blood pressure still at goal, would start ACE-I at low dose like 5 mg. Have patient come back in 2 weeks to consider restarting ACE-I.

## 2011-09-25 NOTE — Patient Instructions (Signed)
Please follow up with Dr. Ina Homes on June 11th, 2013.  Please do not take any blood pressure medications. Please continue to take all other medications as directed.

## 2011-09-25 NOTE — Assessment & Plan Note (Signed)
BMET    Component Value Date/Time   NA 140 09/25/2011 1001   K 4.7 09/25/2011 1001   CL 102 09/25/2011 1001   CO2 29 09/25/2011 1001   GLUCOSE 140* 09/25/2011 1001   BUN 20 09/25/2011 1001   CREATININE 1.00 09/25/2011 1001   CREATININE 0.94 05/22/2010 2101   CALCIUM 9.3 09/25/2011 1001   GFRNONAA >60 11/11/2007 0445   GFRAA  Value: >60        The eGFR has been calculated using the MDRD equation. This calculation has not been validated in all clinical 11/11/2007 0445    Improved. Diuretic has been discontinued, and we will continue to hold ACE-I. Since patient is diabetic, will likely restart ACE-I at low dose like 5 mg.

## 2011-09-25 NOTE — Assessment & Plan Note (Signed)
Resolved. Continue to hold ACE-I for now. May restart in 2-4 weeks.

## 2011-09-25 NOTE — Progress Notes (Signed)
Patient ID: Kathleen [name], female   DOB: 1951-04-04, 61 y.o.   MRN: [id number] Patient ID: Kathleen [name], female   DOB: 08-Nov-1950, 61 y.o.   MRN: [id number] Subjective:     Abdominal Pain  Diabetes Pertinent negatives for hypoglycemia include no dizziness. Pertinent negatives for diabetes include no weakness.  Fall Pertinent negatives include no abdominal pain.   Kathleen [name] is a 61 year old woman with history of DM II, HTN, Neuropathy and recent rotator cuff surgery who presents for repeat blood work and BP recheck.  1) HTN - Was recently started on HCTZ 12.5 mg qday which patient states has helped tremendously and she had a 10 lbs weight loss, however blood pressure dropped to [id number] systolic and she did complain of dizziness. Additionally, she was continued on ACE-I. Her BMet was abnormal for elevated potassium and elevated Cr. She was instructed to stop both diuretic and ACE-I and come back for repeat study and BP recheck. She states she is feeling great being off the medicines. She denies dizziness, or lightheadedness.   3) IBS -no recent flares.  Patient has no other complaints or concerns today. She denies chest pain, cough, sob, headache, N/V, changes in abdominal and urinary character.   Review of Systems  Constitutional: Negative for activity change.  Gastrointestinal: Negative for abdominal pain.  Neurological: Negative for dizziness, syncope, weakness and light-headedness.  All other systems reviewed and are negative.       Objective:   Physical Exam  Constitutional: She appears well-developed.  HENT:  Head: Normocephalic.  Eyes: Pupils are equal, round, and reactive to light.  Neck: Normal range of motion. Neck supple.  Cardiovascular: Normal rate and regular rhythm.   Pulmonary/Chest: Effort normal and breath sounds normal.  Abdominal: Soft.  Genitourinary: Vagina normal and uterus normal.  Musculoskeletal: She exhibits no edema.  Neurological: She is alert.    Psychiatric: She has a normal mood and affect.

## 2011-10-06 ENCOUNTER — Encounter: Payer: Self-pay | Admitting: Internal Medicine

## 2011-10-06 ENCOUNTER — Ambulatory Visit (INDEPENDENT_AMBULATORY_CARE_PROVIDER_SITE_OTHER): Payer: Medicare Other | Admitting: Internal Medicine

## 2011-10-06 VITALS — BP 133/78 | HR 84 | Temp 97.3°F | Ht 60.0 in | Wt 272.5 lb

## 2011-10-06 DIAGNOSIS — B351 Tinea unguium: Secondary | ICD-10-CM | POA: Insufficient documentation

## 2011-10-06 DIAGNOSIS — E119 Type 2 diabetes mellitus without complications: Secondary | ICD-10-CM

## 2011-10-06 DIAGNOSIS — I10 Essential (primary) hypertension: Secondary | ICD-10-CM

## 2011-10-06 DIAGNOSIS — M25511 Pain in right shoulder: Secondary | ICD-10-CM

## 2011-10-06 DIAGNOSIS — M25519 Pain in unspecified shoulder: Secondary | ICD-10-CM

## 2011-10-06 DIAGNOSIS — G629 Polyneuropathy, unspecified: Secondary | ICD-10-CM

## 2011-10-06 DIAGNOSIS — G609 Hereditary and idiopathic neuropathy, unspecified: Secondary | ICD-10-CM

## 2011-10-06 MED ORDER — LISINOPRIL 5 MG PO TABS: 5.0000 mg | ORAL_TABLET | Freq: Every day | ORAL | Status: DC

## 2011-10-06 MED ORDER — AMITRIPTYLINE HCL 50 MG PO TABS: 25.0000 mg | ORAL_TABLET | Freq: Every day | ORAL | Status: DC

## 2011-10-06 MED ORDER — CYCLOBENZAPRINE HCL 5 MG PO TABS: ORAL_TABLET | ORAL | Status: DC

## 2011-10-06 MED ORDER — GABAPENTIN 300 MG PO CAPS: 300.0000 mg | ORAL_CAPSULE | Freq: Three times a day (TID) | ORAL | Status: DC

## 2011-10-06 NOTE — Assessment & Plan Note (Signed)
Lab Results  Component Value Date   NA 140 09/25/2011   K 4.7 09/25/2011   CL 102 09/25/2011   CO2 29 09/25/2011   BUN 20 09/25/2011   CREATININE 1.00 09/25/2011   CREATININE 0.94 05/22/2010    BP Readings from Last 3 Encounters:  10/06/11 133/78  09/25/11 116/67  09/09/11 89/67    Assessment: Hypertension control:  controlled  Progress toward goals:  at goal Barriers to meeting goals:  no barriers identified  Plan: Hypertension treatment:  Will start patient on Lisinopril 5 mg, however patient will not get prescription filled due to affordability until she can get medicaid Plan D which she has applied for, will likely start taking in 1-2 months.

## 2011-10-06 NOTE — Patient Instructions (Signed)
Please follow up in 2 months.  Please take all of your medications as directed.

## 2011-10-06 NOTE — Progress Notes (Signed)
Subjective:     Patient ID: Kaeloni Roads, female   DOB: 18-Jul-1950, 61 y.o.   MRN: [id number]  HPI Ms. Grandinetti is a 61 year old woman with pmh significant for DM, HLD, HTN, IBS, and neuropathy who presents to the clinic for follow up. In the last 2 months patient has been evaluated a few times for issues with blood pressure and lab work. Patient was started on HCTZ and as a result experienced orthostatic hypotension and acute renal insufficiency. HCTZ has been stopped along with ACE-I while kidney function recovers. Patient's labs are back at baseline, and she is feeling great. She got Medicaid/Medicare and is now awaiting plan D so she is able to afford her medications. She ran out of neuropathy medications and reports experiencing burning sensation in both lower extremities.   Review of Systems  Constitutional: Positive for fatigue.  Cardiovascular: Negative for chest pain.  Gastrointestinal: Positive for diarrhea and constipation. Negative for nausea, vomiting and abdominal pain.  Neurological: Positive for weakness and numbness.       Objective:   Physical Exam  Constitutional: She appears well-developed.  HENT:  Head: Normocephalic.  Eyes: Pupils are equal, round, and reactive to light.  Cardiovascular: Normal rate and regular rhythm.   Pulmonary/Chest: Effort normal.  Abdominal: Soft. Bowel sounds are normal. She exhibits no distension.  Musculoskeletal:       Gait is slow secondary to pain  Neurological:       No focal neurologic deficits

## 2011-11-03 ENCOUNTER — Ambulatory Visit (INDEPENDENT_AMBULATORY_CARE_PROVIDER_SITE_OTHER): Payer: Medicare Other | Admitting: Internal Medicine

## 2011-11-03 ENCOUNTER — Encounter: Payer: Self-pay | Admitting: Licensed Clinical Social Worker

## 2011-11-03 ENCOUNTER — Encounter: Payer: Self-pay | Admitting: Internal Medicine

## 2011-11-03 VITALS — BP 100/60 | HR 68 | Temp 98.0°F | Ht 60.0 in | Wt 267.3 lb

## 2011-11-03 DIAGNOSIS — E785 Hyperlipidemia, unspecified: Secondary | ICD-10-CM

## 2011-11-03 DIAGNOSIS — K589 Irritable bowel syndrome without diarrhea: Secondary | ICD-10-CM

## 2011-11-03 DIAGNOSIS — Z0000 Encounter for general adult medical examination without abnormal findings: Secondary | ICD-10-CM

## 2011-11-03 DIAGNOSIS — R109 Unspecified abdominal pain: Secondary | ICD-10-CM

## 2011-11-03 DIAGNOSIS — I10 Essential (primary) hypertension: Secondary | ICD-10-CM

## 2011-11-03 DIAGNOSIS — M25511 Pain in right shoulder: Secondary | ICD-10-CM

## 2011-11-03 DIAGNOSIS — G629 Polyneuropathy, unspecified: Secondary | ICD-10-CM

## 2011-11-03 DIAGNOSIS — J029 Acute pharyngitis, unspecified: Secondary | ICD-10-CM | POA: Insufficient documentation

## 2011-11-03 DIAGNOSIS — M25519 Pain in unspecified shoulder: Secondary | ICD-10-CM

## 2011-11-03 DIAGNOSIS — E119 Type 2 diabetes mellitus without complications: Secondary | ICD-10-CM

## 2011-11-03 DIAGNOSIS — R42 Dizziness and giddiness: Secondary | ICD-10-CM | POA: Insufficient documentation

## 2011-11-03 DIAGNOSIS — G8929 Other chronic pain: Secondary | ICD-10-CM

## 2011-11-03 DIAGNOSIS — G609 Hereditary and idiopathic neuropathy, unspecified: Secondary | ICD-10-CM

## 2011-11-03 DIAGNOSIS — E669 Obesity, unspecified: Secondary | ICD-10-CM

## 2011-11-03 LAB — CBC WITH DIFFERENTIAL/PLATELET
Basophils Absolute: 0.1 10*3/uL (ref 0.0–0.1)
Lymphocytes Relative: 31 % (ref 12–46)
Lymphs Abs: 2.6 10*3/uL (ref 0.7–4.0)
MCV: 84.8 fL (ref 78.0–100.0)
Neutro Abs: 4.8 10*3/uL (ref 1.7–7.7)
Platelets: 236 10*3/uL (ref 150–400)
RBC: 4.54 MIL/uL (ref 3.87–5.11)
RDW: 14.4 % (ref 11.5–15.5)
WBC: 8.4 10*3/uL (ref 4.0–10.5)

## 2011-11-03 LAB — COMPLETE METABOLIC PANEL WITH GFR
Albumin: 3.4 g/dL — ABNORMAL LOW (ref 3.5–5.2)
Alkaline Phosphatase: 96 U/L (ref 39–117)
Calcium: 9.1 mg/dL (ref 8.4–10.5)
Chloride: 103 mEq/L (ref 96–112)
GFR, Est Non African American: 74 mL/min
Glucose, Bld: 132 mg/dL — ABNORMAL HIGH (ref 70–99)
Potassium: 4.5 mEq/L (ref 3.5–5.3)
Sodium: 139 mEq/L (ref 135–145)
Total Protein: 6.5 g/dL (ref 6.0–8.3)

## 2011-11-03 LAB — POCT GLYCOSYLATED HEMOGLOBIN (HGB A1C): Hemoglobin A1C: 7.1

## 2011-11-03 MED ORDER — GLUCOSE BLOOD VI STRP: ORAL_STRIP | Status: DC

## 2011-11-03 MED ORDER — LISINOPRIL 5 MG PO TABS: 5.0000 mg | ORAL_TABLET | Freq: Every day | ORAL | Status: DC

## 2011-11-03 MED ORDER — HYOSCYAMINE SULFATE 0.125 MG SL SUBL: 0.1250 mg | SUBLINGUAL_TABLET | SUBLINGUAL | Status: DC | PRN

## 2011-11-03 MED ORDER — HYDROCODONE-ACETAMINOPHEN 5-500 MG PO TABS: 2.0000 | ORAL_TABLET | Freq: Four times a day (QID) | ORAL | Status: AC | PRN

## 2011-11-03 MED ORDER — GABAPENTIN 300 MG PO CAPS: 300.0000 mg | ORAL_CAPSULE | Freq: Three times a day (TID) | ORAL | Status: DC

## 2011-11-03 MED ORDER — ACCU-CHEK FASTCLIX LANCETS MISC: 1.0000 | Freq: Three times a day (TID) | Status: DC

## 2011-11-03 MED ORDER — NITROGLYCERIN 0.2 MG/HR TD PT24: MEDICATED_PATCH | TRANSDERMAL | Status: DC

## 2011-11-03 MED ORDER — ACCU-CHEK NANO SMARTVIEW W/DEVICE KIT: 1.0000 | PACK | Status: DC

## 2011-11-03 MED ORDER — AMITRIPTYLINE HCL 50 MG PO TABS: 25.0000 mg | ORAL_TABLET | Freq: Every day | ORAL | Status: DC

## 2011-11-03 MED ORDER — INSULIN GLARGINE 100 UNIT/ML ~~LOC~~ SOLN: 65.0000 [IU] | Freq: Every day | SUBCUTANEOUS | Status: DC

## 2011-11-03 MED ORDER — CYCLOBENZAPRINE HCL 5 MG PO TABS: ORAL_TABLET | ORAL | Status: DC

## 2011-11-03 NOTE — Progress Notes (Signed)
CSW met with Ms. Furuya after her scheduled MD appt.  Pt wanted to meet with RD as well, but RD is currently with another pt.  CSW encouraged pt to schedule appt with RD.  Ms. Dery states she recently has been approved for Disability and has Medicare and Medicaid and is re-establishing car.  Pt states her medication copays are $3.  Ms. Garguilo denies add'l needs at this time.  CSW provided Ms. Sherbert with contact information.  Pt is aware CSW is available to assist as needed.

## 2011-11-03 NOTE — Progress Notes (Signed)
Subjective:    Patient ID: Kathleen [name], female    DOB: 12/09/1950, 61 y.o.   MRN: [id number]  HPI Comments: 61 y.o woman with multiple chronic medical problems (DM 2, dyslipidemia, morbid obesity, DJD, peripheral neuropathy ?IBS (since 2009) here for annual health maintenance exam.  She complains lower ab pain including suprapubic region that radiates to RLQ. The pain is chronic, today 9/10.  Sensation is crampy and feels like a muscle spasm and is associated with diarrhea.  Her medication hyoscyamine helps with the sensation. Her sx's are worse with eating meals so she tried to eat small meals.  She was diagnosed with suspected IBS in 2009 but feels the sxs are wosenening.  She also c/o burning sensation like "bee stings" in her b/l feet and loss of sensation in toes>feet.  Her feet are also painful especially if she sits still for a while. She has to walk slowly and move her feet while sitting for long periods of time which help.  She has pain in her feet everyday and states the pain is 9/10 but gabapentin helps.  She needs Rx refills of all her medications and DM supplies.  She has not been checking her glucose levels because she is out of strips.  She mentions she can not afford medication and she has been w/o her medication for about 2 months but recently purchased some of her medications 2-3 days prior to this visit.   [facility] [address], [place].  She reports chronic right shoulder pain improved since 05/2011 repair of torn tendon.  She reports she does have radiation of her right shoulder pain-->arm but ROM exercise helped her shoulder pain which is not 5-6/10.  She has not been taking Vicodin for pain but will take an occasional pill for pain when needed.  She reports feeling dizzy with positional changes. This has improved since stopping Accupril 20 mg 2 pills qd.     SH: 3 kids (1 died at age 29 y.o accidental drowning, 29 y.o daughter, 71 y.o son), recent sick contacts with  children, no job used to [employer] work.    Abdominal Pain This is a chronic problem. The current episode started more than 1 year ago. The problem occurs daily. The problem has been gradually worsening. The pain is located in the RLQ and suprapubic region. The pain is at a severity of 9/10. The quality of the pain is cramping. The abdominal pain radiates to the RLQ. Associated symptoms include diarrhea. Pertinent negatives include no constipation, fever or hematuria. The pain is aggravated by eating. The pain is relieved by bowel movements. The treatment provided mild relief. Prior diagnostic workup includes GI consult. Her past medical history is significant for gallstones and irritable bowel syndrome.      Review of Systems  Constitutional: Positive for appetite change. Negative for fever and unexpected weight change.       Cold intolerance Wt loss 277 lbs-->267 lbs intentional (walking on treadmill) +night sweats Decreased appetite  HENT:       Intermittent forehead h/a (temple) 4-5/10, Advil helps +sore throat   Eyes: Positive for visual disturbance.       Blurry vision  Respiratory: Positive for cough and shortness of breath.        +dry cough +sob with exertion (fast pace walking)  Cardiovascular: Negative for chest pain and palpitations.  Gastrointestinal: Positive for abdominal pain and diarrhea. Negative for constipation.       +diarrhea  Genitourinary: Negative for hematuria.       Postmenopausal  Musculoskeletal: Positive for gait problem.       H/o lower extremity swelling improved w/ elevation Slower gait d/t peripheral neuropathy   Neurological: Positive for dizziness and light-headedness.       Denies recent h/o fainting       Objective:   Physical Exam  Nursing note and vitals reviewed. Constitutional: She is oriented to person, place, and time. She appears well-developed and well-nourished. She is cooperative. No distress.       Morbidly obese  HENT:    Head: Normocephalic and atraumatic.  Mouth/Throat: Oropharynx is clear and moist and mucous membranes are normal. She has dentures. No oropharyngeal exudate.         Top dentures  Eyes: Conjunctivae are normal. Pupils are equal, round, and reactive to light.  Neck:    Cardiovascular: Normal rate, regular rhythm, S1 normal, S2 normal and normal heart sounds.  Exam reveals no gallop and no friction rub.   No murmur heard. Pulmonary/Chest: Effort normal and breath sounds normal. She has no wheezes.  Abdominal: Soft. Bowel sounds are normal. There is tenderness in the left upper quadrant.    Musculoskeletal:       No pitting edema noted to lower ext b/l  Lymphadenopathy:    She has cervical adenopathy.       Anterior cervical  Mild adenopathy noted  Neurological: She is alert and oriented to person, place, and time. She has normal strength. A sensory deficit is present. Gait abnormal.       Loss of sensation b/l feet  Skin: Skin is warm, dry and intact. No rash noted.       Extremely tan skin Brittle toenails, left thumb w/ dystrophic fingernail Multiple scars to ab, right back, left arm  Psychiatric: She has a normal mood and affect. Her speech is normal and behavior is normal.          Assessment & Plan:  Chronic medical conditions addressed, pt will f/u

## 2011-11-04 ENCOUNTER — Telehealth: Payer: Self-pay | Admitting: *Deleted

## 2011-11-04 ENCOUNTER — Encounter: Payer: Self-pay | Admitting: Internal Medicine

## 2011-11-04 DIAGNOSIS — G8929 Other chronic pain: Secondary | ICD-10-CM | POA: Insufficient documentation

## 2011-11-04 LAB — MICROALBUMIN / CREATININE URINE RATIO
Creatinine, Urine: 78.5 mg/dL
Microalb Creat Ratio: 7.5 mg/g (ref 0.0–30.0)
Microalb, Ur: 0.59 mg/dL (ref 0.00–1.89)

## 2011-11-04 NOTE — Assessment & Plan Note (Signed)
LDL at goal last check 69 in 08/2011

## 2011-11-04 NOTE — Assessment & Plan Note (Addendum)
HA1C 7.5% 08/2011 Given Rx for new DM supplies (meter, test strips, lancets) since noncompliant d/t being out of supplies Pt unable to speak to DM educator today but needs to speak to DM educator at next visit Assoc. Microvascular complications i.e peripheral neuropathy Labs collected CMP, HA1C, urine microalbumin Cont. Lantus 65 units qhs and pt instructed to bring meter in to eval readings at next appts.

## 2011-11-04 NOTE — Assessment & Plan Note (Signed)
1/4 criteria present=anterior cervical adenopathy (Other criteria not present: exudate, fever, no cough) This means there is low probability for strep pharyngitis and no indication to perform dx test Pt has dry cough and erythema to hard palate noted.  Etiology possibly viral w/ recent h/o exposure to sick children No treatment today

## 2011-11-04 NOTE — Progress Notes (Signed)
Spoke with pt on cell phone 11/04/11 at 5:56 Pm  Pt reports her right shoulder pain is improved after tendon repair 05/2011.  She does not need the Flexeril or NTG patch (patch was previously given to pt by Sports medicine MD 03/11/11 for right shoulder pain).  Pt denies h/o CAD or chest pain related to heart Explained to pt medications may make her feel dizzy; reviewed patients medications Flexeril, Neurontin, Levsin, Lisinopril may cause this sx Worth d/c Flexeril for a while and consider other agents (i.e Robaxin) in the future if right shoulder pain persists  Will not add Robaxin until after a trial of being free of Flexeril to see if dizziness resolves if pt willing.   Aundra Dubin [phone]

## 2011-11-04 NOTE — Assessment & Plan Note (Addendum)
No sensation b/l feet; pt instructed to wear hard sole shoes all the time Foot exam performed today Cont. Neurontin 300 mg tid prn but advised pt she may want to take med at night due to sx's of dizziness In the future may want titrate up on dose to see if helps w/peripheral neuropathy

## 2011-11-04 NOTE — Assessment & Plan Note (Signed)
Cont. Hyoscyamine Sent pt home to get sample of stool for studies Will get earlier appt with GI

## 2011-11-04 NOTE — Telephone Encounter (Signed)
I will route to Dr. Aundra Dubin, who say patient yesterday.

## 2011-11-04 NOTE — Telephone Encounter (Signed)
Pharm calls and states insurance will not pay for flexeril, will pay for aphinidrine(SP?) or robaxin. Can you change her to one of these? If so send electronically and change med list  Thanks, Alonnie Bieker

## 2011-11-04 NOTE — Assessment & Plan Note (Signed)
BP controlled today Will repeat BMP Continue Lisinopril 5 mg qd , previously d/ced Accupril d/t pt not being able to tolerate medication

## 2011-11-04 NOTE — Assessment & Plan Note (Addendum)
Eye referral made  Social worker Kathleen [name]) spoke with pt today about social needs i.e getting Rx glasses and meds) Mammogram 02/2011 wnl. Pt will need mammogram in the future Pap due 2016, neg path 08/2011 Colonoscopy up to date 12/2008 wnl w/ neg bxs-will need colonoscopy 2015-2020

## 2011-11-04 NOTE — Assessment & Plan Note (Signed)
Worsening since 2009 and associated with diarrhea Previously saw GI (Dr. Fuller Plan) who stated could be IBS but further w/u may be warrented Attempted to collect stool sample in clinic w/o success but GI rec O&P, stool Cx, fecal fat and other studies to r/o celiac vs enteric pathogens vs other Pt given container and instructions to collect speciman at home Continue Levsin (given Rx refill), prn Vicodin (pain contract and Rx refill given) Referred to GI for sooner appt to w/u, obtained CMP

## 2011-11-04 NOTE — Assessment & Plan Note (Addendum)
Orthostatic Bps perform. They were wnl Reviewed patients medications Flexeril, Neurontin, Levsin, Lisinopril may cause this sx Pt was using Nitroglycerin transdermal to help with right shoulder pain (per 03/11/2011 Sports medicine MD note); Disc with patient that if she does not have CAD or anginal sxs to stop medication.  Pt denies needing NTG for CAD or anginal sx's See latter phone note 11/04/11-disc with pt if Flexeril is not helping right shoulder sxs or no longer needed then stop potential risk SE. Also her insurance will not cover Flexeril.  Plan:  stop Flexeril and NTG patch

## 2011-11-04 NOTE — Assessment & Plan Note (Addendum)
Improved since surgical repair 05/2011 Prn Vicodin if needed, ROM exercises to be continued b/c they help  Pt was following with Sports Medicine and was given Flexeril and Nitroglycerin patch to help with pain but pt no longer needs these medications because her pain is improved  Plan: Stop Flexeril and NTG patch consider other agents (i.e Robaxin) in thedistant future if right shoulder pain worsens but will not add Robaxin until after a trial of being free of Flexeril d/t experiencing dizziness

## 2011-11-04 NOTE — Assessment & Plan Note (Signed)
Morbidly obese >30 BMI Disc with pt goal wt should be 100-130 lbs for ht of 5' Disc methods to achieve with diet and exercise (pt walking on treadmill and outside but disc increasing freq and disc meals in moderation)

## 2011-11-04 NOTE — Assessment & Plan Note (Signed)
Noted in abdomen and right shoulder Pain contract signed today  Rx refill of Vicodin prn

## 2011-11-05 ENCOUNTER — Telehealth: Payer: Self-pay | Admitting: Dietician

## 2011-11-05 NOTE — Progress Notes (Signed)
I saw patient and discussed his care with resident Dr. Aundra Dubin.  I agree with the clinical findings and plans as outlined in her note.

## 2011-11-05 NOTE — Telephone Encounter (Signed)
Patient to call and meet with RD when she drops off stool sample this week or next.

## 2011-11-09 ENCOUNTER — Ambulatory Visit (INDEPENDENT_AMBULATORY_CARE_PROVIDER_SITE_OTHER): Payer: Medicare Other | Admitting: Dietician

## 2011-11-09 ENCOUNTER — Other Ambulatory Visit: Payer: Medicare Other

## 2011-11-09 VITALS — Ht 60.0 in | Wt 273.3 lb

## 2011-11-09 DIAGNOSIS — E119 Type 2 diabetes mellitus without complications: Secondary | ICD-10-CM

## 2011-11-09 NOTE — Patient Instructions (Addendum)
  Can use rice cakes and rice crackers, rice chex, rice krispies,  Corn chex. boiled nuts, eggs, chicken, [place], fish, water ice instead of ice cream, hard cheese like cheddar, sweet potatoes and potatoes for starch, lite margarine  Please stop using 2% milk, ice cream, Nab crackers and Mango juice, sour cream and saltines,   Walk daily for 10 minutes after each meal.  Checking blood sugars 3x a day will help you know when it is more than 140 then you need to cut back on your food intake or exercise or both.   See list for foods that are least likely to bother your Irritable Bowel.

## 2011-11-09 NOTE — Addendum Note (Signed)
Addended by: Truddie Crumble on: 11/09/2011 10:57 AM   Modules accepted: Orders

## 2011-11-09 NOTE — Progress Notes (Signed)
Medical Nutrition Therapy:  Appt start time: 1050 end time:  1140.  Assessment:  Primary concerns today: Weight management, Blood sugar control and Meal planning.  Patient with dx of IBS- has tried avoiding lactose to no avail. However. Also eats ice cream, sour cream and other sources of lactose.Weiht gain from 219# in 11/2007 to 273# today- 54# in 4 years. Usual eating patternincludes 3 meals and 2-3 snacks per day.Usual physical activity includes trying to get back to walking, using treadmill. Has new meter- Accu chek nano- CBGs as follows:   7-15    95 7-14  134      144    204 7-13   265     109 7-12              208 Everyday foods include 2% milk, Nabs, salad.  Avoided foods include lactose free milk, fried foods, grits 24-hr recall: B ( 7 am)-  Nabs x 2 or Cereal, 2% milk Snk (9 AM)- Fruit   L ( 12-1PM)- salad with lettuce, tomato and cucumbers   D (5  PM)-salad, baked chicken or salmon patty ir tuna with fat free mayo  Snk ( PM)- peanut butter and saltines or small dish ice cream  Progress Towards Goal(s):  In progress.   Nutritional Diagnosis:  [employer]-3.4 Unintentional weight gain As related to increased caloric intake and decerased activity .  As evidenced by patient report..    Intervention:   1-Nutrition FODMAPS diet provided. 2- Nutrition counseling to assist patient in weight loss goals.  Coordination of care- consider vitamin D testing, alternative medicine for diabetes to promote weight loss, decrease insulin when > 90% CBG < 140 mg/dal  Monitoring/Evaluation:  Dietary intake, exercise, blood sugars, and body weight in 4 week(s).

## 2011-11-10 ENCOUNTER — Encounter: Payer: Self-pay | Admitting: Internal Medicine

## 2011-11-11 LAB — FECAL FAT QUALITATIVE
Free Fatty Acids: NORMAL
NEUTRAL FAT: NORMAL

## 2011-11-11 LAB — OVA AND PARASITE SCREEN: OP: NONE SEEN

## 2011-11-13 LAB — STOOL CULTURE

## 2011-11-16 ENCOUNTER — Encounter: Payer: Self-pay | Admitting: Gastroenterology

## 2011-11-16 ENCOUNTER — Ambulatory Visit (INDEPENDENT_AMBULATORY_CARE_PROVIDER_SITE_OTHER): Payer: Medicare Other | Admitting: Gastroenterology

## 2011-11-16 VITALS — BP 128/70 | HR 60 | Ht 60.0 in | Wt 271.0 lb

## 2011-11-16 DIAGNOSIS — R109 Unspecified abdominal pain: Secondary | ICD-10-CM

## 2011-11-16 DIAGNOSIS — K589 Irritable bowel syndrome without diarrhea: Secondary | ICD-10-CM

## 2011-11-16 MED ORDER — HYOSCYAMINE SULFATE 0.125 MG SL SUBL: SUBLINGUAL_TABLET | SUBLINGUAL | Status: DC

## 2011-11-16 NOTE — Progress Notes (Signed)
History of Present Illness: This is a 61 year old female with a history of irritable bowel syndrome. She states she has crampy abdominal pain across her upper abdomen occasionally radiating to her lower abdomen. Her symptoms are brought on by meals. She has frequent loose, watery, nonbloody diarrhea following meals. She does not use hyoscyamine regularly. She states she generally takes at night. She started a low lactose gluten free diet about one week ago and her symptoms have improved. Celiac antibody testing was negative last year. Recent stool studies and studies for fecal fat were negative.  Current Medications, Allergies, Past Medical History, Past Surgical History, Family History and Social History were reviewed in [employer] record.  Physical Exam: General: Well developed , well nourished, obese, no acute distress Head: Normocephalic and atraumatic Eyes:  sclerae anicteric, EOMI Ears: Normal auditory acuity Mouth: No deformity or lesions Lungs: Clear throughout to auscultation Heart: Regular rate and rhythm; no murmurs, rubs or bruits Abdomen: Soft, large, mild upper tunnel tenderness to deep palpation without rebound or guarding and non distended. No masses, hepatosplenomegaly or hernias noted. Normal Bowel sounds Musculoskeletal: Symmetrical with no gross deformities  Pulses:  Normal pulses noted Extremities: No clubbing, cyanosis, edema or deformities noted Neurological: Alert oriented x 4, grossly nonfocal Psychological:  Alert and cooperative. Normal mood and affect  Assessment and Recommendations:  1. Irritable bowel syndrome. She has a component of lactose intolerance. Avoid lactose products and high fat foods. She recently started a low lactose, gluten free diet and this has helped her symptoms. Blood work for celiac disease was negative last year. Use hyoscyamine 0.125 mg, 1-2 before all meals and every 4 hours as needed.   Consider glycopyrrolate 2 mg  twice daily or hyoscyamine 0.375 mg twice daily. I will defer to her primary physician to order these medications if the adjustment in her Levsin is not adequate to control her symptoms. In addition she may use Imodium twice daily as needed for diarrhea control.

## 2011-11-16 NOTE — Patient Instructions (Addendum)
Increase your Levsin to 1-2 tablets by mouth every 4 hours as needed 30 minutes before meals. Ongoing follow up with your Primary Care Physician.  cc: Dominic Pea, MD

## 2011-11-30 ENCOUNTER — Encounter (INDEPENDENT_AMBULATORY_CARE_PROVIDER_SITE_OTHER): Payer: Medicare Other | Admitting: Ophthalmology

## 2011-11-30 DIAGNOSIS — H33309 Unspecified retinal break, unspecified eye: Secondary | ICD-10-CM

## 2011-11-30 DIAGNOSIS — E11319 Type 2 diabetes mellitus with unspecified diabetic retinopathy without macular edema: Secondary | ICD-10-CM

## 2011-11-30 DIAGNOSIS — I10 Essential (primary) hypertension: Secondary | ICD-10-CM

## 2011-11-30 DIAGNOSIS — H43819 Vitreous degeneration, unspecified eye: Secondary | ICD-10-CM

## 2011-11-30 DIAGNOSIS — E1139 Type 2 diabetes mellitus with other diabetic ophthalmic complication: Secondary | ICD-10-CM

## 2011-11-30 DIAGNOSIS — H35039 Hypertensive retinopathy, unspecified eye: Secondary | ICD-10-CM

## 2011-12-01 ENCOUNTER — Ambulatory Visit: Payer: Medicare Other | Admitting: Gastroenterology

## 2011-12-07 ENCOUNTER — Other Ambulatory Visit: Payer: Self-pay | Admitting: *Deleted

## 2011-12-16 ENCOUNTER — Ambulatory Visit (INDEPENDENT_AMBULATORY_CARE_PROVIDER_SITE_OTHER): Payer: Medicare Other | Admitting: Ophthalmology

## 2011-12-16 DIAGNOSIS — E1165 Type 2 diabetes mellitus with hyperglycemia: Secondary | ICD-10-CM

## 2011-12-16 DIAGNOSIS — E11319 Type 2 diabetes mellitus with unspecified diabetic retinopathy without macular edema: Secondary | ICD-10-CM

## 2011-12-16 DIAGNOSIS — H33309 Unspecified retinal break, unspecified eye: Secondary | ICD-10-CM

## 2012-01-07 ENCOUNTER — Encounter: Payer: Self-pay | Admitting: Internal Medicine

## 2012-01-07 ENCOUNTER — Other Ambulatory Visit: Payer: Self-pay | Admitting: *Deleted

## 2012-01-07 ENCOUNTER — Ambulatory Visit (INDEPENDENT_AMBULATORY_CARE_PROVIDER_SITE_OTHER): Payer: Medicare Other | Admitting: Internal Medicine

## 2012-01-07 VITALS — BP 142/65 | HR 80 | Temp 98.2°F | Ht 60.0 in | Wt 286.1 lb

## 2012-01-07 DIAGNOSIS — E669 Obesity, unspecified: Secondary | ICD-10-CM

## 2012-01-07 DIAGNOSIS — E119 Type 2 diabetes mellitus without complications: Secondary | ICD-10-CM

## 2012-01-07 DIAGNOSIS — E785 Hyperlipidemia, unspecified: Secondary | ICD-10-CM

## 2012-01-07 DIAGNOSIS — Z0000 Encounter for general adult medical examination without abnormal findings: Secondary | ICD-10-CM

## 2012-01-07 DIAGNOSIS — G629 Polyneuropathy, unspecified: Secondary | ICD-10-CM

## 2012-01-07 DIAGNOSIS — G609 Hereditary and idiopathic neuropathy, unspecified: Secondary | ICD-10-CM

## 2012-01-07 DIAGNOSIS — K589 Irritable bowel syndrome without diarrhea: Secondary | ICD-10-CM

## 2012-01-07 DIAGNOSIS — I10 Essential (primary) hypertension: Secondary | ICD-10-CM

## 2012-01-07 MED ORDER — HYOSCYAMINE SULFATE 0.125 MG PO TABS: 0.1250 mg | ORAL_TABLET | ORAL | Status: DC | PRN

## 2012-01-07 NOTE — Progress Notes (Signed)
  Subjective:    Patient ID: Kathleen [name], female    DOB: 1950/09/20, 61 y.o.   MRN: [id number]  HPI 61 yr. Old WF presents for follow up. She states she had a fever of 104.0 F on Sunday, but this resolved with Advil.  She noticed a small "cold sore" on her lower left lip during the time. She states she had some mild SOB and hoarse voice, dry cough.  Those symptoms have gotten better.  She states she has occasional nausea and diarrhea due to IBS, but this is unchanged and controlled on symptoms.   She reports some sharp Right sided shoulder pain since surgery that is improving. Denies CP at rest or with exertion. Denies melena, hematochezia. She states she's had high blood sugars, as high as 315 (but admits she's been having some issues with taking her insulin and following diet recently due to a sick sister).  Denies hypoglycemia. She admits she forgets to take her insulin at times, causing "HI" readings on her meter. I educated her on this and the consequences.    Review of Systems The complete 12 point review of systems is otherwise negative except for that stated in the HPI.    Objective:   Physical Exam GEN: AAOx3, NAD HEENT: EOMI, PERRLA, no icterus, no adenopathy, moist mucosa. CV: S1S2, no m/r/g, RRR. PULM: CTA bilat. ABD/GI: Obese, minimal tenderness over RUQ, no guarding, no distention, +BS, no palpable masses. LE/UE: 2/4 pulses, 1+ pitting edema, no ulcers on foot exam today. She has no sensation to monofilament bilat plantar aspects of foot. NEURO: CN II-XII intact, no focal deficits noted except for feet as above.     Assessment & Plan:  73 yr. Old female w/ pmhx significant for Chronic abdominal pain, likely IBS, Type 2 IDDM, Type 2 DM with peripheral neuropathy, HTN, HL, morbid obesity Body mass index is 55.88 kg/(m^2)., presents for follow up. 1) IBS/Chronic abdominal pain: Follows with GI. I changed her Rx for hyoscyamine from SL to PO form since medicaid did not cover  this. She states this helps her. 2) Type 2 IDDM:  HBA1C is coming down, almost within target. Educated her on adherence. She has no microalbuminuria noted on 11/01/11. Continue current Lantus dose. 3) HTN: Noted increased today, but states this is odd for her. She has well controlled BP on past visits. I will have a recheck performed. Otherwise, continue current HTN tx. 4) Type 2 DM with peripheral neuropathy: Cont neurontin and elavil, she states they are "a big help". Educated on self foot exams. 5) Right shoulder pain: Improved, previously following with sports medicine. States her shoulder is doing well. 6) Morbid obesity: She understands her weight put her at higher risk for complications. She is trying to exercise. She is following diabetic meals. 7) Recent fever: Resolved, likely recent URI. 8) Health Maintenance: States she saw optho this year. Recent mammo on 11/12, repeat next year.  Colonoscopy in 2010, repeat in 5 years.  PAP is due 2016. Return to clinic in 3 months. Dominic Pea

## 2012-01-07 NOTE — Patient Instructions (Signed)
Follow up in three months. Continue weight loss attempt. No blood work today. Prescription for IBS has been sent to your pharmacy.

## 2012-01-07 NOTE — Telephone Encounter (Signed)
Pharmacy called in error

## 2012-01-08 ENCOUNTER — Telehealth: Payer: Self-pay | Admitting: *Deleted

## 2012-01-08 DIAGNOSIS — K589 Irritable bowel syndrome without diarrhea: Secondary | ICD-10-CM

## 2012-01-08 NOTE — Telephone Encounter (Signed)
Pt calls and states you gave her the wrong med for ibs and that she told you that medicare will not cover the med that was sent in, she is somewhat agitated. Please send new script or advise.

## 2012-01-11 ENCOUNTER — Other Ambulatory Visit: Payer: Self-pay | Admitting: Dietician

## 2012-01-11 ENCOUNTER — Other Ambulatory Visit: Payer: Self-pay | Admitting: Internal Medicine

## 2012-01-11 DIAGNOSIS — E669 Obesity, unspecified: Secondary | ICD-10-CM

## 2012-01-11 DIAGNOSIS — G629 Polyneuropathy, unspecified: Secondary | ICD-10-CM

## 2012-01-11 DIAGNOSIS — E785 Hyperlipidemia, unspecified: Secondary | ICD-10-CM

## 2012-01-11 DIAGNOSIS — I10 Essential (primary) hypertension: Secondary | ICD-10-CM

## 2012-01-11 DIAGNOSIS — Z0000 Encounter for general adult medical examination without abnormal findings: Secondary | ICD-10-CM

## 2012-01-11 DIAGNOSIS — K589 Irritable bowel syndrome without diarrhea: Secondary | ICD-10-CM

## 2012-01-11 DIAGNOSIS — E119 Type 2 diabetes mellitus without complications: Secondary | ICD-10-CM

## 2012-01-11 MED ORDER — HYOSCYAMINE SULFATE 0.125 MG PO TABS: 0.1250 mg | ORAL_TABLET | ORAL | Status: DC | PRN

## 2012-01-11 MED ORDER — GLUCOSE BLOOD VI STRP: ORAL_STRIP | Status: DC

## 2012-01-11 MED ORDER — ACCU-CHEK FASTCLIX LANCETS MISC: 1.0000 | Freq: Three times a day (TID) | Status: DC

## 2012-01-11 NOTE — Telephone Encounter (Signed)
Strips for SMBG not being covered. Per [facility] Patient needs hardcopy new rx printed with dx code, instructions with frequency- (3x/day testing) and physician signature. CDE will mail rxs to patient.

## 2012-01-11 NOTE — Telephone Encounter (Signed)
Kathleen [name], the issue with this medication is that it was prescribed at the sublingual form which is not covered. I switched it to the tablet, which should be covered. Please have her speak to our social worker to look into this. If it is not covered, I will gladly fill out a form to appeal this based on medical necessity.  If this is an issue, please have her call her GI physician to ask for any other alternative, thanks.

## 2012-01-13 NOTE — Telephone Encounter (Signed)
I believe I left them in the triage office box. Let me know if you can't find them.

## 2012-01-18 NOTE — Telephone Encounter (Signed)
Bonnita Nasuti, any information for me on this issue?

## 2012-01-19 ENCOUNTER — Ambulatory Visit (INDEPENDENT_AMBULATORY_CARE_PROVIDER_SITE_OTHER): Payer: Medicare Other | Admitting: Dietician

## 2012-01-19 ENCOUNTER — Telehealth: Payer: Self-pay | Admitting: *Deleted

## 2012-01-19 ENCOUNTER — Encounter: Payer: Self-pay | Admitting: Dietician

## 2012-01-19 DIAGNOSIS — E119 Type 2 diabetes mellitus without complications: Secondary | ICD-10-CM

## 2012-01-19 MED ORDER — GLUCOSE BLOOD VI STRP: ORAL_STRIP | Status: DC

## 2012-01-19 MED ORDER — ACCU-CHEK FASTCLIX LANCETS MISC: 1.0000 | Freq: Three times a day (TID) | Status: DC

## 2012-01-19 NOTE — Telephone Encounter (Signed)
Called once again the other two listed numbers without answer.

## 2012-01-19 NOTE — Telephone Encounter (Signed)
Kathleen [name], please find out from this patient's pharmacy if she was able to get her medication for IBS (hyoscyamine)? Please continue to try to reach the patient as well to find out this information. If there is a form involved that I need to fill out please let me know. Thanks.

## 2012-01-19 NOTE — Patient Instructions (Addendum)
You lost 7 # in the 2 months.  Please make follow up in 4 weeks.  Your goal is to exercise each day. On the chart, find the day of the week  week and the column for the activity you did or write one in under the "OTHERr " column,  write in the box how many minutes you spent doing that activity on that day.

## 2012-01-19 NOTE — Telephone Encounter (Signed)
Unable to reach pt

## 2012-01-19 NOTE — Progress Notes (Signed)
Medical Nutrition Therapy:  Appt start time: 1030 end time:  1115.  Assessment:  Primary concerns today: Weight management, Blood sugar control and Meal planning.  Has had loose stools for 2 weeks because she is out of medicine. Weight loss of 7#. Eating less high fat foods and smaller portions.  Usual eating pattern 3 meals 8-9 am, 1 Pm and 5-6 PM. Usual physical activity includes walking on treadmill ~ 15 minutes 1-3 times a day. CBGs are increased as follows:  Before breakfast: 141/169/141/173/ 247/186                                                              Before lunch: 182/201/231/91                                                              Before dinner:152/193/262/201/250/222 Everyday foods include 2% lactose free milk, salad.  Avoided foods include soy milk, nabs, fried foods, ice cream 24-hr recall: B ( 7 am)-  1/2 cup sugar free jello or Cereal with 2% milk Snk (9 AM)- Fruit from FODMAPS list  L ( 12-1PM)- salad with lettuce, tomato and cucumbers and vinegarette dressing, crystal lite  D (5  PM)-salad, [place] burger, diet Dr. Malachi Bonds Snk ( PM)- peanut butter and saltines or small dish ice cream  Progress Towards Goal(s):  In progress.   Nutritional Diagnosis:  [place]-3.4 Unintentional weight gain As related to increased caloric intake and decerased activity is improving as evidenced by decrease in weight.    Intervention:   1-Nutrition education about FODMAPS diet for IBS review done today. 2- Nutrition counseling to assist patient in weight loss goals.  3- Coordination of care- consider vitamin D testing, alternative medicine for diabetes to promote weight loss, decrease insulin when > 90% CBG < 140 mg/dal  Monitoring/Evaluation:  Dietary intake, exercise, blood sugars, and body weight in 4 week(s).

## 2012-01-19 NOTE — Telephone Encounter (Signed)
I called patient at 1:24 pm and left a message on her answering machine to call me back.

## 2012-01-19 NOTE — Telephone Encounter (Signed)
Hi, the originals have not been located so could you reprint so i may fax them, i spoke w/ donnap. And the pharmacist and i may fax them if we have them to do so with. i am sorry that we were not able to locate them.

## 2012-01-19 NOTE — Telephone Encounter (Signed)
Dr. Fuller Plan,  This patient has been having some difficulty filling hyoscyamine for her IBS, apparently not covered by medicaid (SL form and possible tablet form as well). Is there a specific drug we can change her to? Thanks.

## 2012-01-19 NOTE — Telephone Encounter (Signed)
i have tried once again w/ no luck

## 2012-01-19 NOTE — Telephone Encounter (Signed)
Please see my office note from 11/16/11. Can also try dicyclomine.

## 2012-01-19 NOTE — Telephone Encounter (Signed)
i tried to call pt back and was unable to reach her

## 2012-01-20 NOTE — Telephone Encounter (Signed)
Kathleen [name], has this patient called me back yet regarding difficulty with her Rx?

## 2012-01-21 NOTE — Telephone Encounter (Signed)
Thank you Gladys!

## 2012-01-21 NOTE — Telephone Encounter (Signed)
Call to pt to ask if she has gotten her Hyoscyamine pt said that she still has some of the previous prescription left and pharmacy will call her when the prescription is ready.  Call to [facility] to ask about the medication.  Pt was given a discount card the cost of Hyoscyamine # 30 tablets will be $23.59.   The cost for 120 of the tablets will be $78.25.  Medication is not covered by Medicare and cannot be prior authorized.  Pt's Lantus will be covered at a cost of $ 3.50. Pt when asked has not gone to the pharmacy to pick up Lantus.   Pt was called and informed of.  Sander Nephew, RN 01/21/2012 2:45 PM.

## 2012-01-22 MED ORDER — DICYCLOMINE HCL 20 MG PO TABS: 20.0000 mg | ORAL_TABLET | Freq: Three times a day (TID) | ORAL | Status: DC | PRN

## 2012-01-22 NOTE — Telephone Encounter (Signed)
Kathleen [name], inform her that I have replaced hyoscyamine with dicyclomine as recommended by her GI physician and sent this into her pharmacy.

## 2012-01-22 NOTE — Addendum Note (Signed)
Addended by: Dominic Pea on: 01/22/2012 03:22 PM   Modules accepted: Orders

## 2012-01-25 ENCOUNTER — Telehealth: Payer: Self-pay | Admitting: *Deleted

## 2012-01-25 DIAGNOSIS — E119 Type 2 diabetes mellitus without complications: Secondary | ICD-10-CM

## 2012-01-25 NOTE — Telephone Encounter (Signed)
Pt here to see nutritionist on 09/24 and presented paperwork that her Lantus vials will no longer be covered by her plan.. The insurance has asked that the lantus be changed to levemir which is on the preferred list.  If MD feels that the coverage option listed is not clinically appropriated for pt's situation, then MD can request a formulary exception and submit a statement supporting the request.  Will forward to md for review.Kathleen Hidden Cassady9/30/20139:43 AM

## 2012-01-26 NOTE — Telephone Encounter (Signed)
Please clarify the coverage issues with her medications, I am getting conflicting information from our staff. Thanks.

## 2012-01-26 NOTE — Telephone Encounter (Signed)
Per pharmacy lantus is NOT covered by medicare.  Please order levemir . They have the Rx for bentyl ready for pt to pick at at $1.15.  Pt notified.

## 2012-01-27 MED ORDER — INSULIN DETEMIR 100 UNIT/ML ~~LOC~~ SOLN: SUBCUTANEOUS | Status: DC

## 2012-01-27 NOTE — Telephone Encounter (Signed)
Please call patient and inform her that her Lantus has been changed to Levemir pen. Please inform her to report any hypoglycemia (BG less 70) to me immediately. She should watch her BG closely (preferably twice daily and at the onset of low blood sugar symptoms) while this switch is made due to insurance coverage issues.

## 2012-01-27 NOTE — Telephone Encounter (Signed)
Pt informed and voices understanding.  She checks CBG three times a day. Will call for readings under 70.

## 2012-01-29 ENCOUNTER — Telehealth: Payer: Self-pay | Admitting: Dietician

## 2012-01-29 NOTE — Telephone Encounter (Signed)
Follow up to change in insulin. Left message for her to call us if needed.

## 2012-02-18 ENCOUNTER — Ambulatory Visit (INDEPENDENT_AMBULATORY_CARE_PROVIDER_SITE_OTHER): Payer: Medicare Other | Admitting: Dietician

## 2012-02-18 ENCOUNTER — Ambulatory Visit (INDEPENDENT_AMBULATORY_CARE_PROVIDER_SITE_OTHER): Payer: Medicare Other

## 2012-02-18 ENCOUNTER — Other Ambulatory Visit: Payer: Self-pay | Admitting: Internal Medicine

## 2012-02-18 VITALS — Ht 60.0 in | Wt 284.2 lb

## 2012-02-18 DIAGNOSIS — E119 Type 2 diabetes mellitus without complications: Secondary | ICD-10-CM

## 2012-02-18 DIAGNOSIS — Z23 Encounter for immunization: Secondary | ICD-10-CM

## 2012-02-18 MED ORDER — INSULIN DETEMIR 100 UNIT/ML ~~LOC~~ SOLN: SUBCUTANEOUS | Status: DC

## 2012-02-18 NOTE — Patient Instructions (Signed)
Your blood sugars are higher in the evening.   Please try to reduce portion sizes of  fruit, potatoes, bread, potato salad you eat in the evening.   If you buy a scale to weigh yourself, please do so every day.   Please make a follow up with Kathleen [name] In 4 weeks

## 2012-02-18 NOTE — Progress Notes (Signed)
Medical Nutrition Therapy:  Appt start time: 1115 end time:  1200.  Assessment:  Primary concerns today: Weight management, Blood sugar control and Meal planning. Stools loose, but better on new medicine and as long as she doesn't eat out. Weight with little change. Reportseating less lower fat foods and small portions, drinking only water.  Usual eating pattern 3 meals 8-9 am, 11am,  1 Pm and 5-6 PM, some snacking in PM. Usual physical activity reported is  walking on treadmill ~ 15 minutes 101-15 minutes a day and now doing chair exercises 10 minutes 3 times a day. CBGs continue to show pattern of increased values in Pm after 5 PM.  Progress Towards Goal(s):  In progress.   Nutritional Diagnosis:  [place]-3.4 Unintentional weight gain As related to increased caloric intake and decerased activity is improving as evidenced by decrease in weight.    Intervention:   1-Nutrition education about using self monitoring(she is thinking about purchasing a scale) to achieve goals. 2- Nutrition counseling about behavior change to assist patient in weight loss goals.  3- Coordination of care- consider vitamin D testing, alternative medicine for diabetes to promote weight loss.   Monitoring/Evaluation:  Dietary intake, exercise, blood sugars, and body weight in 4 week(s).

## 2012-03-17 ENCOUNTER — Ambulatory Visit (INDEPENDENT_AMBULATORY_CARE_PROVIDER_SITE_OTHER): Payer: Medicare Other | Admitting: Dietician

## 2012-03-17 ENCOUNTER — Telehealth: Payer: Self-pay | Admitting: Dietician

## 2012-03-17 VITALS — Ht 60.0 in | Wt 282.3 lb

## 2012-03-17 DIAGNOSIS — E119 Type 2 diabetes mellitus without complications: Secondary | ICD-10-CM

## 2012-03-17 NOTE — Patient Instructions (Addendum)
Your weight today is 282.3#- 2 pounds down. Congratulations!!!   Suggest stoping treadmill and do chair exercises for 10-15 minutes 2-3 times.   Keep up the great work of being active and eating healthier!  Please write down everything for 2 days and bring with you to your next visit with me.  Please make a follow up appointment for 4 weeks        Bariatric Surgery (Gastrointestinal Surgery for Severe Obesity) Severe obesity is a longstanding condition. It is difficult to treat through diet and exercise alone. Gastrointestinal surgery is the best option for people who are severely obese and cannot lose weight by traditional means, or who suffer from serious obesity-related health problems. The surgery promotes weight loss by decreasing the absorption of food and, in some operations, interrupting the digestive process. As in other treatments for obesity, the best results are achieved with healthy eating behaviors and regular physical activity.  People who may consider gastrointestinal surgery include those with a body mass index (BMI) above 40. This is about 100 pounds of overweight for men and 80 pounds for women. People with a BMI between 35 and 40 and who suffer from type 2 diabetes or life-threatening cardiopulmonary (heart and lung) problems, such as severe sleep apnea or obesity-related heart disease, may also be candidates for surgery. (To use the Body Mass Index chart. find your weight on the bottom of the graph. Go straight up from that point until you come to the line that matches your height. Then look to find your weight group). The idea of gastrointestinal surgery to control obesity grew out of results of operations for cancer or severe ulcers that removed large portions of the stomach or small intestine. Patients undergoing these procedures tended to lose weight after surgery. So some physicians began to use such operations to treat severe obesity. The first operation that was widely  used for severe obesity was the intestinal bypass. This operation was first used 40 years ago. It produced weight loss by causing malabsorption. The idea was that patients could eat large amounts of food, which would be poorly digested or passed along too fast for the body to absorb many calories. The problem with this surgery was that it caused a loss of essential nutrients. Also, its side effects were unpredictable and sometimes fatal. The original form of the intestinal bypass operation is no longer used. THE NORMAL DIGESTIVE PROCESS Normally, as food moves along the digestive tract, digestive juices and enzymes digest and absorb calories and nutrients. After we chew and swallow our food, it moves down the esophagus to the stomach. There a strong acid continues the digestive process. The stomach can hold about 3 pints of food at one time. When the stomach contents move to the first portion of the small intestine (duodenum ), bile and pancreatic juice speed up digestion. Most of the iron and calcium in the foods we eat is absorbed in the duodenum. The jejunum and ileum are the remaining two segments of the nearly 20 feet of small intestine. They complete the absorption of almost all calories and nutrients. The food particles that cannot be digested in the small intestine are stored in the large intestine until eliminated.  HOW DOES SURGERY PROMOTE WEIGHT LOSS? Gastrointestinal surgery for obesity is also called bariatric surgery. It alters the digestive process. The operations promote weight loss by closing off parts of the stomach. This will make it smaller. Operations that only reduce stomach size are known as "restrictive operations".  They restrict the amount of food the stomach can hold. Some operations combine stomach restriction with a partial bypass of the small intestine. These procedures create a direct connection from the stomach to the lower segment of the small intestine. This causes bypassing  portions of the digestive tract that absorb calories and nutrients. These are known as malabsorptive operations. WHAT ARE THE SURGICAL OPTIONS? There are several types of restrictive and malabsorptive operations. Each one carries its own benefits and risks.  Restrictive Operations  Restrictive operations serve only to restrict food intake. They do not interfere with the normal digestive process. To perform the surgery, doctors create a small pouch at the top of the stomach where food enters from the esophagus. At first, the pouch holds about 1 ounce of food. It later expands to 2-3 ounces. The lower outlet of the pouch usually has a diameter of only about  inch. This small outlet delays the emptying of food from the pouch and causes a feeling of fullness. As a result of this surgery, most people lose the ability to eat large amounts of food at one time. After an operation, the person usually can eat only  to 1 cup of food without discomfort or nausea. Also, food has to be well chewed. Restrictive operations for obesity include adjustable gastric banding (AGB) and vertical banded gastroplasty (VBG).  Adjustable gastric banding  In this procedure, a hollow band made of special material is placed around the stomach near its upper end. This creates a small pouch and a narrow passage into the larger remainder of the stomach. The band is then inflated with a salt solution. It can be tightened or loosened over time to change the size of the passage by increasing or decreasing the amount of salt solution.  The band is adjusted based on feelings of hunger and weight loss. Patients decide when they need an adjustment and come to their surgeons to evaluate this. The adjustment is done as an office visit. The band is fully reversible with a second surgery if the patient changes his/her mind. There is no cutting or re-routing of the intestine.  Vertical banded gastroplasty  VBG has been the most common restrictive  operation for weight control. Both a band and staples are used to create a small stomach pouch. Vertical banded gastroplasty is based on the same principle of restriction as the band. But the stomach is surgically altered with the stapling. This treatment is not reversible.  Restrictive operations lead to weight loss in almost all patients. But they are less successful than malabsorptive operations in achieving substantial, long-term weight loss. About 30 percent of those who undergo VBG achieve normal weight. About 80 percent achieve some degree of weight loss. Some patients regain weight. Others are unable to adjust their eating habits and fail to lose the desired weight. Successful results depend on the patient's willingness to adopt a long-term plan of healthy eating and regular physical activity.  A common risk of restrictive operations is vomiting. This is caused when the small stomach is overly stretched by food particles that have not been chewed well. Band slippage and saline leakage have been reported after AGB. Risks of VBG include wearing away of the band and breakdown of the staple line. In a small number of cases, stomach juices may leak into the abdomen. This requires an emergency operation. In less than 1 percent of all cases, infection or death from complications may occur. Malabsorptive Operations  Malabsorptive operations are the most  common gastrointestinal surgeries for weight loss. They restrict both food intake and the amount of calories and nutrients the body absorbs.  Roux-en-Y gastric bypass (RGB)  This operation is the most common and successful malabsorptive surgery. First, a small stomach pouch is created to restrict food intake. Next, a Y-shaped section of the small intestine is attached to the pouch. This allows food to bypass the lower stomach, the first segment of the small intestine (duodenum), and the first portion of the jejunum (the second segment of the small  intestine). This bypass reduces the amount of calories and nutrients the body absorbs.  Biliopancreatic diversion (BPD)  In this more complicated malabsorptive operation, portions of the stomach are removed. The small pouch that remains is connected directly to the final segment of the small intestine, completely bypassing the duodenum and the jejunum. This procedure successfully promotes weight loss. But it is less frequently used than other types of surgery because of the high risk for nutritional deficiencies. A variation of BPD includes a "duodenal switch". This leaves a larger portion of the stomach intact, including the pyloric valve. This valve regulates the release of stomach contents into the small intestine. It also keeps a small part of the duodenum in the digestive pathway.  Malabsorptive operations produce more weight loss than restrictive operations. And they are more effective in reversing the health problems associated with severe obesity. Patients who have malabsorptive operations generally lose two-thirds of their excess weight within 2 years.  In addition to the risks of restrictive surgeries, malabsorptive operations also carry greater risk for nutritional deficiencies. This is because the procedure causes food to bypass the duodenum and jejunum. That is where most iron and calcium are absorbed. Menstruating women may develop anemia because not enough vitamin B12 and iron are absorbed. Decreased absorption of calcium may also bring on osteoporosis and metabolic bone disease. Patients are required to take nutritional supplements that usually prevent these deficiencies. Patients who have the biliopancreatic diversion surgery must also take fat-soluble (dissolved by fat) vitamins A, D, E, and K supplements.  RGB and BPD operations may also cause "dumping syndrome". This means that stomach contents move too rapidly through the small intestine. Symptoms include nausea, weakness, sweating,  faintness, and sometimes diarrhea after eating. The duodenal switch operation keeps the pyloric valve intact. So it may reduce the likelihood of dumping syndrome.  The more extensive the bypass, the greater the risk is for complications and nutritional deficiencies. Patients with extensive bypasses of the normal digestive process require close monitoring. They also need life-long use of special foods, supplements, and medications. EXPLORE BENEFITS AND RISKS Surgery to produce weight loss is a serious undertaking. Anyone thinking about surgery should understand what the operation involves. Patients and physicians should carefully consider the following benefits and risks.  Benefits  Right after surgery, most patients lose weight quickly. They continue to lose for 18 to 24 months after the procedure. Most patients regain 5 to 10 percent of the weight they lost. But many maintain a long-term weight loss of about 100 pounds.  Surgery improves most obesity-related conditions. For example, in one study blood sugar levels of 83 percent of obese patients with diabetes returned to normal after surgery. Nearly all patients whose blood sugar levels did not return to normal were older. Or they had lived with diabetes for a long time. Risks  Ten to 20 percent of patients who have weight-loss surgery require follow-up operations to correct complications. Abdominal hernia was the most common  complication requiring follow-up surgery. But laparoscopic techniques seem to have solved this problem. In laparoscopy, the surgeon makes one or more small incisions. Slender surgical instruments are passed them. This technique eliminates the need for a large incision. And it creates less tissue damage. Patients who are super obese (greater than 350 pounds) or have had previous abdominal surgery, may not be good candidates for laparoscopy. Less common complications include breakdown of the staple line and stretched stomach  outlets.  Some obese patients who have weight-loss surgery develop gallstones. These are clumps of cholesterol and other matter that form in the gallbladder. During quick or substantial weight loss, one's risk of developing gallstones increases. Taking supplemental bile salts for the first 6 months after surgery can prevent them.  Nearly 30 percent of patients who have weight-loss surgery develop nutritional deficiencies. These include anemia, osteoporosis, and metabolic bone disease. These usually can be avoided if vitamin and mineral intakes are high enough.  Women of childbearing age should avoid pregnancy until their weight becomes stable. Quick weight loss and nutritional deficiencies can harm a growing fetus.  Other risks of restrictive surgeries include:  Band slippage.  Stomach prolapse.  Band erosion into the lumen of the stomach.  Port infection.  The main risk with malabsorption operations is life threatening. It is the risk of leak from any of the anastomosis. The more involved the operation, the more risk involved.  There is one other risk of having the surgery. If people do not follow a strict diet, they will stretch out their stomach pouches. Then they will not lose weight. MEDICAL COSTS Gastrointestinal surgery costs vary. They depend on the procedure. Medical insurance coverage varies by state and insurance provider. If you are considering gastrointestinal surgery, contact your r egional Medicare or Medicaid office or your insurance plan. Find out from them if the procedure is covered. IS THE SURGERY FOR YOU?  Gastrointestinal surgery may be the next step for people who remain severely obese after trying nonsurgical approaches or have an obesity-related disease. Candidates for surgery have:  A BMI of 40 or more.  A BMI of 35 or more and a life-threatening obesity-related health problem such as:  Diabetes.  Severe sleep apnea.  Heart disease.  Obesity-related  physical problems that interfere with:  Employment.  Walking.  Family function. If you fit the profile for surgery, answers to these questions may help you decide whether weight-loss surgery is appropriate for you. Are you:  Unlikely to lose weight successfully without surgery?  Well informed about the surgical procedure? The effects of treatment?  Determined to lose weight? Improve your health?  Aware of how your life may change after the operation? Adjustment to the side effects of the surgery include the need to chew well and being unable to eat large meals.  Aware of the potential for serious complications? Dietary restrictions? Occasional failures?  Committed to lifelong medical follow-up?  Restrictive operations are very successful with patients who follow a diet created by a dietician. Support groups and follow up with caregivers is important. Remember: There are no guarantees for any method to produce and maintain weight loss. This includes surgery. Success is possible only with:  Maximum cooperation.  Commitment to behavioral change.  Medical follow-up. This cooperation and commitment must be carried out for the rest of your life.  ADDITIONAL RESOURCES [employer] Met[employer] Bariat[employer] [place], [id number] [place] [address] www.asmbs.org  Weight-control Information Network (WIN) [address] [place] [address] [employer] Document Released: 04/13/2005 Document Revised: 07/06/2011 Document Reviewed: 07/07/2006 [facility] Patient Information 2013 [place].   Laparoscopic Gastric Band Surgery This surgery is done to help you lose weight.  BEFORE THE SURGERY  Do not gain any more weight once you know you will be having this surgery.  Arrange for someone to take you home from the hospital.  The day before the surgery:  Eat small liquid meals such as broth.  Use half of the surgical scrub, if  given, to shower or bathe with. Do not use the surgical scrub to wash your hair. Use regular shampoo.  The day of the surgery:  Shower or bathe using the second half of the surgical scrub.  Arrive at your appointment time.  You will change into a hospital gown.  A tube (IV) will be put in your vein. SURGERY A band is put around the upper part of your stomach. This makes a small pouch which can hold only a small amount of food. The lower, bigger part of your stomach is below the band. The 2 parts stay connected by a small opening between the upper and the lower parts. Food goes through the opening to the lower part of your stomach more slowly than before the surgery. You will feel more full with smaller amounts of food. On the inner lining of the band around your stomach is a balloon. The balloon is empty during the surgery. Later, at an office visit, it is filled with fluid. Your doctor puts the fluid in through a tube (port) that is right under the skin of your belly. AFTER THE SURGERY  You will go to the recovery room.  You may be given pain medicine.  You may be asked to walk once you are stable.  You may have shoulder pain caused by the gas.  By the time you go home, try to walk for 35 minutes every day.  You will be shown how to use a small breathing machine (incentive spirometer). This will help you take deep breaths. You need to use this machine several times a day while you are in the hospital and after you go home.  You will need to take another test. For this test, you will swallow a liquid that will show up on X-ray. You will have X-rays taken while you are in different positions. These X-rays will show:  If your new stomach has any leaks.  How well your new stomach holds liquids.  How the liquid moves down to your gut.  Try to not throw up (vomit). Tell your doctor if you feel sick to your stomach (nauseous). There is medicine to help keep you from throwing up.  Your  diet will begin with drinking clear liquids (jello, tea, juice and broth) in small amounts.  Your doctor will decide when you are ready to drink or eat more. Marland Kitchen

## 2012-03-17 NOTE — Progress Notes (Signed)
Medical Nutrition Therapy:  Appt start time: 1115 end time:  125.  Assessment:  Primary concerns today: Weight management, Blood sugar control and Meal planning. Stools loose again. Weight decreased ~ 2 #. Having knee pain from walking on treadmill ~ 5-15 minutes  a day for past month.  now doing chair exercises 10 minutes 3 times a day. Patient reports poor sleep- cough at night keeping her awake. CBGs show pattern of increased values in am , decreased in afternoon then much higher ~ 5 PM. Patient denies any change in food intake from usual low fat diet, 3 meals a day, only non caloric beverages . Wants to discuss weight loss surgery.  24 hour recall suggest intake of ~ 1200 calories per day. Estimated needs ~ 1400-1500 calories per day.  Meter download average is ~180 mg/dl with 61% in target, A1C noted to have increased.   Progress Towards Goal(s):  In progress.   Nutritional Diagnosis:  Omao-3.4 Unintentional weight gain As related to increased caloric intake and decreased activity is improving slowly as evidenced by decrease in weight.    Intervention:   1-Nutrition education about bariatric surgery.  2- Nutrition counseling about behavior change to assist patient in weight loss goals.  3- Coordination of care- consider vitamin D testing, incretin mimetic and or weight loss medicine. Discuss with physician.  Called patient pharmacy to be certain insulin Rx covered. A1C per patient request  Monitoring/Evaluation:  Dietary intake, exercise, blood sugars, and body weight in 4 week(s).

## 2012-03-17 NOTE — Telephone Encounter (Signed)
Per lab personel patint was tearful when she left. Calling patient to be sure she is okay. Left message

## 2012-03-23 ENCOUNTER — Ambulatory Visit: Payer: Medicare Other | Admitting: Internal Medicine

## 2012-03-23 NOTE — Telephone Encounter (Signed)
Patient said she was fine when CDE spoke with her. She agreed to appointment.

## 2012-03-29 ENCOUNTER — Other Ambulatory Visit: Payer: Self-pay | Admitting: *Deleted

## 2012-03-29 DIAGNOSIS — K589 Irritable bowel syndrome without diarrhea: Secondary | ICD-10-CM

## 2012-03-30 MED ORDER — DICYCLOMINE HCL 20 MG PO TABS: 20.0000 mg | ORAL_TABLET | Freq: Three times a day (TID) | ORAL | Status: DC | PRN

## 2012-04-14 ENCOUNTER — Ambulatory Visit (INDEPENDENT_AMBULATORY_CARE_PROVIDER_SITE_OTHER): Payer: Medicare Other | Admitting: Internal Medicine

## 2012-04-14 ENCOUNTER — Encounter: Payer: Self-pay | Admitting: Internal Medicine

## 2012-04-14 VITALS — BP 140/82 | HR 69 | Temp 97.3°F | Ht 60.0 in | Wt 279.5 lb

## 2012-04-14 DIAGNOSIS — E119 Type 2 diabetes mellitus without complications: Secondary | ICD-10-CM

## 2012-04-14 DIAGNOSIS — G609 Hereditary and idiopathic neuropathy, unspecified: Secondary | ICD-10-CM

## 2012-04-14 DIAGNOSIS — G629 Polyneuropathy, unspecified: Secondary | ICD-10-CM

## 2012-04-14 DIAGNOSIS — K219 Gastro-esophageal reflux disease without esophagitis: Secondary | ICD-10-CM

## 2012-04-14 DIAGNOSIS — K589 Irritable bowel syndrome without diarrhea: Secondary | ICD-10-CM

## 2012-04-14 DIAGNOSIS — I10 Essential (primary) hypertension: Secondary | ICD-10-CM

## 2012-04-14 MED ORDER — LISINOPRIL 10 MG PO TABS: 10.0000 mg | ORAL_TABLET | Freq: Every day | ORAL | Status: DC

## 2012-04-14 MED ORDER — INSULIN DETEMIR 100 UNIT/ML ~~LOC~~ SOLN: SUBCUTANEOUS | Status: DC

## 2012-04-14 MED ORDER — DICYCLOMINE HCL 20 MG PO TABS: 20.0000 mg | ORAL_TABLET | Freq: Three times a day (TID) | ORAL | Status: DC | PRN

## 2012-04-14 MED ORDER — METFORMIN HCL 500 MG PO TABS: 500.0000 mg | ORAL_TABLET | Freq: Two times a day (BID) | ORAL | Status: DC

## 2012-04-14 MED ORDER — AMITRIPTYLINE HCL 50 MG PO TABS: 25.0000 mg | ORAL_TABLET | ORAL | Status: DC

## 2012-04-14 MED ORDER — OMEPRAZOLE 20 MG PO CPDR: 20.0000 mg | DELAYED_RELEASE_CAPSULE | Freq: Every day | ORAL | Status: DC

## 2012-04-14 NOTE — Patient Instructions (Addendum)
Increase levemir 68 units at night. Start metformin (new medication) at 500 twice daily with meals. Increase Lisinopril to 10 mg daily (since you have 5 mg tablets, for now you can take two of these a day until you fill the new bottle). Return to clinic in one month. Referral made to bariatric surgery. Referral made for mammogram.

## 2012-04-14 NOTE — Progress Notes (Signed)
  Subjective:    Patient ID: Kathleen [name], female    DOB: 1951/04/02, 62 y.o.   MRN: [id number]  HPI States she feels great today.  States her BG this morning increased to "3-something".  Denies hypoglycemia.  Admits to some phlegm at night with some cough. Admits to some reflux symptoms associated with this.  Denies chest pain. She states her IBS symptoms are controlled. Review of BG readings indicate highest reading of 330, lowest of 101, and average of 193.  She is above target about 50% of the time.  No hypoglycemic symptoms. HgbA1C has increased from 7.1% to 8.8%. She states she was previously on metformin and this was stopped, but denies any significant GI symptoms from this.   Review of Systems Complete 12 point review of systems is otherwise negative except for that stated in the HPI.    Objective:   Physical Exam Filed Vitals:   04/14/12 1024  BP: 140/82  Pulse: 69  Temp: 97.3 F (36.3 C)   GEN: AAOx3, NAD. HEENT: EOMI, PERRLA, no icterus, no adenopathy. CV: S1S2, no m/r/g, RRR. PULM: CTA bilat. ABD/GI: Soft, obese, NT, +BS, no guarding. LE/UE: 2/4 pulses, no c/c/e. No lesions. No ulcers. NEURO: CN II-XII intact, no focal deficits.     Assessment & Plan:  89 yr. Old female w/ pmhx significant for Chronic abdominal pain, likely IBS, Type 2 IDDM, Type 2 DM with peripheral neuropathy, HTN, HL, morbid obesity Body mass index is 55.88 kg/(m^2)., presents for follow up.  1) IBS/Chronic abdominal pain: Bentyl is helping her, she is happy with this change. 2) GERD: This is likely causing her cough at night and phlegm production.  Omeprazole 20 mg nightly. 2) Type 2 IDDM: . She has no microalbuminuria noted on 11/01/11. Increase Levemir to 68 units nightly. Add metformin 500 mg po bid. I educated her on side effects, risks and benefits of treatment. She agrees to current tx. Check BG three times daily. 3) HTN: Increase lisinopril to 10 mg po daily, not on target. 4) Type 2 DM with  peripheral neuropathy: Cont neurontin and elavil. Educated on self foot exams.  5) Right shoulder pain: Improved, previously following with sports medicine. States her shoulder is doing well.  6) Morbid obesity: She understands her weight put her at higher risk for complications. She is trying to exercise. She is following diabetic meals. She is requesting referral for bariatric surgery. Will place referral to Dr. Hassell Done at [facility]. Body mass index is 54.59 kg/(m^2). 7) Health Maintenance: Eye exam done this year, 2013. Needs screening a mammogram. Colonoscopy in 2010, repeat in 5 years. PAP is due 2016.  Return to clinic in 3 months. Dominic Pea

## 2012-04-18 ENCOUNTER — Ambulatory Visit (INDEPENDENT_AMBULATORY_CARE_PROVIDER_SITE_OTHER): Payer: Medicare Other | Admitting: Ophthalmology

## 2012-04-18 DIAGNOSIS — H43819 Vitreous degeneration, unspecified eye: Secondary | ICD-10-CM

## 2012-04-18 DIAGNOSIS — H33309 Unspecified retinal break, unspecified eye: Secondary | ICD-10-CM

## 2012-04-18 DIAGNOSIS — E11319 Type 2 diabetes mellitus with unspecified diabetic retinopathy without macular edema: Secondary | ICD-10-CM

## 2012-04-18 DIAGNOSIS — E1165 Type 2 diabetes mellitus with hyperglycemia: Secondary | ICD-10-CM

## 2012-04-18 DIAGNOSIS — H2510 Age-related nuclear cataract, unspecified eye: Secondary | ICD-10-CM

## 2012-04-18 DIAGNOSIS — H35039 Hypertensive retinopathy, unspecified eye: Secondary | ICD-10-CM

## 2012-04-18 DIAGNOSIS — I10 Essential (primary) hypertension: Secondary | ICD-10-CM

## 2012-04-22 ENCOUNTER — Telehealth: Payer: Self-pay | Admitting: *Deleted

## 2012-04-22 NOTE — Telephone Encounter (Signed)
RTC to pt after voice mail requesting a call to discuss her visit discharge instructions.  Message left on pt's voicemail that the Clinics had called and to return call to the Clinics if needed.  Sander Nephew, RN 04/22/2012 11:45 AM.

## 2012-05-12 ENCOUNTER — Encounter: Payer: Medicare Other | Admitting: Internal Medicine

## 2012-05-12 ENCOUNTER — Other Ambulatory Visit: Payer: Self-pay | Admitting: *Deleted

## 2012-05-12 ENCOUNTER — Ambulatory Visit (INDEPENDENT_AMBULATORY_CARE_PROVIDER_SITE_OTHER): Payer: Medicare Other | Admitting: Dietician

## 2012-05-12 VITALS — Ht 60.0 in | Wt 284.4 lb

## 2012-05-12 DIAGNOSIS — E119 Type 2 diabetes mellitus without complications: Secondary | ICD-10-CM

## 2012-05-12 DIAGNOSIS — K219 Gastro-esophageal reflux disease without esophagitis: Secondary | ICD-10-CM

## 2012-05-12 DIAGNOSIS — I10 Essential (primary) hypertension: Secondary | ICD-10-CM

## 2012-05-12 DIAGNOSIS — K589 Irritable bowel syndrome without diarrhea: Secondary | ICD-10-CM

## 2012-05-12 DIAGNOSIS — G629 Polyneuropathy, unspecified: Secondary | ICD-10-CM

## 2012-05-12 MED ORDER — DICYCLOMINE HCL 20 MG PO TABS: 20.0000 mg | ORAL_TABLET | Freq: Three times a day (TID) | ORAL | Status: DC | PRN

## 2012-05-12 MED ORDER — AMITRIPTYLINE HCL 50 MG PO TABS: 25.0000 mg | ORAL_TABLET | Freq: Every evening | ORAL | Status: DC | PRN

## 2012-05-12 NOTE — Patient Instructions (Signed)
Please continue doing floor/chair exercises and stretching, eating 6 healthy small meals a day    Whole grains    Fruits    vegetables    Milk and yogurt  Please don't walk if knees hurting until you get the okay from Dr. Murlean Caller  Make and appointment with me in 4 weeks, and reschedule with Dr.Paya

## 2012-05-12 NOTE — Progress Notes (Signed)
Medical Nutrition Therapy:  Appt start time: 1115 end time:  [id number].  Assessment:  Primary concerns today: Weight management, Blood sugar control and Meal planning. . Weight increased ~ 2 #. A1C 8.8%. Still has knee pain.  now doing chair exercises.CBGs reported as improved. Happy with 5# gain from holidays, back to eating healthier choices again.  Wants to discuss weight loss surgery.  1400-1500 calories per day.   Progress Towards Goal(s):  In progress.   Nutritional Diagnosis:  [place]-3.4 Unintentional weight gain As related to increased caloric intake and decreased activity is wihtout progress over past month as evidenced by increase in weight.    Intervention:   1-Nutrition education about eating 3 small meals and 3 snacks daily.  2- Nutrition counseling using MI about behavior change to assist patient in weight loss goals.  3- Coordination of care- assisted patient with obtaining refills.  Monitoring/Evaluation:  Dietary intake, exercise, blood sugars, and body weight in 4 week(s).

## 2012-05-25 ENCOUNTER — Ambulatory Visit: Payer: Medicare Other

## 2012-05-27 ENCOUNTER — Other Ambulatory Visit: Payer: Self-pay | Admitting: *Deleted

## 2012-05-27 NOTE — Telephone Encounter (Signed)
review 

## 2012-06-09 ENCOUNTER — Encounter: Payer: Medicare Other | Admitting: Internal Medicine

## 2012-06-09 ENCOUNTER — Encounter: Payer: Medicare Other | Admitting: Dietician

## 2012-06-17 ENCOUNTER — Ambulatory Visit
Admission: RE | Admit: 2012-06-17 | Discharge: 2012-06-17 | Disposition: A | Discharge status: Home / Self Care | Payer: Medicare Other | Source: Ambulatory Visit | Attending: Internal Medicine | Admitting: Internal Medicine

## 2012-06-17 DIAGNOSIS — E119 Type 2 diabetes mellitus without complications: Secondary | ICD-10-CM

## 2012-06-30 ENCOUNTER — Encounter: Payer: Medicare Other | Admitting: Dietician

## 2012-06-30 ENCOUNTER — Encounter: Payer: Medicare Other | Admitting: Internal Medicine

## 2012-07-01 ENCOUNTER — Encounter: Payer: Medicare Other | Admitting: Internal Medicine

## 2012-07-14 ENCOUNTER — Encounter: Payer: Medicare Other | Admitting: Internal Medicine

## 2012-07-21 ENCOUNTER — Encounter: Payer: Medicare Other | Admitting: Internal Medicine

## 2012-07-28 ENCOUNTER — Telehealth: Payer: Self-pay | Admitting: Dietician

## 2012-08-05 NOTE — Telephone Encounter (Signed)
Mailed patient phone # and information about how to sign up for bariatric education classes.

## 2012-08-05 NOTE — Telephone Encounter (Signed)
Thanks

## 2012-08-05 NOTE — Telephone Encounter (Signed)
Returned call to patient. She is asking about bariatric surgery follow up. Told patient I'd try to let her know what they next steps is for her.

## 2012-08-08 ENCOUNTER — Other Ambulatory Visit: Payer: Self-pay | Admitting: *Deleted

## 2012-08-08 DIAGNOSIS — E119 Type 2 diabetes mellitus without complications: Secondary | ICD-10-CM

## 2012-08-08 DIAGNOSIS — K589 Irritable bowel syndrome without diarrhea: Secondary | ICD-10-CM

## 2012-08-08 DIAGNOSIS — K219 Gastro-esophageal reflux disease without esophagitis: Secondary | ICD-10-CM

## 2012-08-08 DIAGNOSIS — G629 Polyneuropathy, unspecified: Secondary | ICD-10-CM

## 2012-08-08 DIAGNOSIS — I10 Essential (primary) hypertension: Secondary | ICD-10-CM

## 2012-08-08 MED ORDER — OMEPRAZOLE 20 MG PO CPDR: 20.0000 mg | DELAYED_RELEASE_CAPSULE | Freq: Every day | ORAL | Status: DC

## 2012-08-08 MED ORDER — LISINOPRIL 10 MG PO TABS: 10.0000 mg | ORAL_TABLET | Freq: Every day | ORAL | Status: DC

## 2012-08-08 MED ORDER — METFORMIN HCL 500 MG PO TABS: 500.0000 mg | ORAL_TABLET | Freq: Two times a day (BID) | ORAL | Status: DC

## 2012-08-08 NOTE — Telephone Encounter (Signed)
Appt on EPIC 09/01/12 in [facility].

## 2012-09-01 ENCOUNTER — Ambulatory Visit (INDEPENDENT_AMBULATORY_CARE_PROVIDER_SITE_OTHER): Payer: Medicare Other | Admitting: Internal Medicine

## 2012-09-01 VITALS — BP 155/89 | HR 65 | Temp 98.1°F | Ht 60.0 in | Wt 290.8 lb

## 2012-09-01 DIAGNOSIS — I10 Essential (primary) hypertension: Secondary | ICD-10-CM

## 2012-09-01 DIAGNOSIS — R609 Edema, unspecified: Secondary | ICD-10-CM

## 2012-09-01 DIAGNOSIS — E119 Type 2 diabetes mellitus without complications: Secondary | ICD-10-CM

## 2012-09-01 DIAGNOSIS — R0609 Other forms of dyspnea: Secondary | ICD-10-CM

## 2012-09-01 DIAGNOSIS — G629 Polyneuropathy, unspecified: Secondary | ICD-10-CM

## 2012-09-01 DIAGNOSIS — K219 Gastro-esophageal reflux disease without esophagitis: Secondary | ICD-10-CM

## 2012-09-01 DIAGNOSIS — G609 Hereditary and idiopathic neuropathy, unspecified: Secondary | ICD-10-CM

## 2012-09-01 DIAGNOSIS — K589 Irritable bowel syndrome without diarrhea: Secondary | ICD-10-CM

## 2012-09-01 DIAGNOSIS — R0600 Dyspnea, unspecified: Secondary | ICD-10-CM

## 2012-09-01 LAB — COMPLETE METABOLIC PANEL WITH GFR
ALT: 10 U/L (ref 0–35)
AST: 15 U/L (ref 0–37)
Albumin: 3.1 g/dL — ABNORMAL LOW (ref 3.5–5.2)
Alkaline Phosphatase: 90 U/L (ref 39–117)
GFR, Est Non African American: 77 mL/min
Potassium: 4.5 mEq/L (ref 3.5–5.3)
Sodium: 138 mEq/L (ref 135–145)
Total Bilirubin: 0.3 mg/dL (ref 0.3–1.2)
Total Protein: 7.3 g/dL (ref 6.0–8.3)

## 2012-09-01 LAB — PRO B NATRIURETIC PEPTIDE: Pro B Natriuretic peptide (BNP): 1154 pg/mL — ABNORMAL HIGH (ref <126)

## 2012-09-01 LAB — GLUCOSE, CAPILLARY: Glucose-Capillary: 217 mg/dL — ABNORMAL HIGH (ref 70–99)

## 2012-09-01 MED ORDER — GLUCOSE BLOOD VI STRP: ORAL_STRIP | Status: DC

## 2012-09-01 MED ORDER — ACCU-CHEK NANO SMARTVIEW W/DEVICE KIT: 1.0000 | PACK | Status: DC

## 2012-09-01 MED ORDER — OMEPRAZOLE 20 MG PO CPDR: 20.0000 mg | DELAYED_RELEASE_CAPSULE | Freq: Every day | ORAL | Status: DC

## 2012-09-01 MED ORDER — "INSULIN SYRINGE 31G X 5/16"" 1 ML MISC": Status: DC

## 2012-09-01 MED ORDER — METFORMIN HCL 500 MG PO TABS: 500.0000 mg | ORAL_TABLET | Freq: Two times a day (BID) | ORAL | Status: DC

## 2012-09-01 MED ORDER — AMITRIPTYLINE HCL 50 MG PO TABS: 25.0000 mg | ORAL_TABLET | Freq: Every evening | ORAL | Status: DC | PRN

## 2012-09-01 MED ORDER — INSULIN DETEMIR 100 UNIT/ML ~~LOC~~ SOLN: SUBCUTANEOUS | Status: DC

## 2012-09-01 MED ORDER — LISINOPRIL 10 MG PO TABS: 10.0000 mg | ORAL_TABLET | Freq: Every day | ORAL | Status: DC

## 2012-09-01 MED ORDER — DICYCLOMINE HCL 20 MG PO TABS: 20.0000 mg | ORAL_TABLET | Freq: Three times a day (TID) | ORAL | Status: DC | PRN

## 2012-09-01 MED ORDER — FUROSEMIDE 20 MG PO TABS: 20.0000 mg | ORAL_TABLET | Freq: Every day | ORAL | Status: DC

## 2012-09-01 MED ORDER — GABAPENTIN 300 MG PO CAPS: 300.0000 mg | ORAL_CAPSULE | Freq: Three times a day (TID) | ORAL | Status: DC

## 2012-09-01 NOTE — Progress Notes (Signed)
  Subjective:    Patient ID: Kathleen [name], female    DOB: 08-05-1950, 62 y.o.   MRN: [id number]  HPI She is pending evaluation for bariatric surgery here at [facility].  States the past two weeks she has been having decreased appetite, some vague viral like symptoms.  Reports some diarrhea that is getting better. Denies hypoglycemia.  Admits the highest reading "300 -something". Meter reviewed: highest 328, lowest 128.  No hypoglycemic events.  She is above range 62% of the time.  States she took her levemir last night.  States her legs are more swollen. States she is having 2 pillow orthopnea, some PND reported.   Review of Systems Complete 12 point review of systems otherwise negative except for that stated in the HPI.    Objective:   Physical Exam Filed Vitals:   09/01/12 0945  BP: 139/71  Pulse: 74  Temp: 98.1 F (36.7 C)    GEN: AAOx3, NAD. HEENT: EOMI, PERRLA, no icterus, no adeopathy. CV: S1S2, no m/r/g, RRR, no JVD noted. PULM: CTA bilat ABD/GI: Soft, Obese, NT, +BS, no guarding, no distention. LE/UE: 2/4 pulses. 2+ bilat pitting edema. No lesions, no ulcers. Decreased sensation to monofilament. NEURO: CN II-XII intact, no focal deficits.      Assessment & Plan:  62 yr. Old female w/ pmhx significant for Chronic abdominal pain, likely IBS, Type 2 IDDM, Type 2 DM with peripheral neuropathy, HTN, HL, morbid obesity Body mass index is 55.88 kg/(m^2)., presents for follow up.  1) IBS/Chronic abdominal pain: Bentyl is helping her, she is happy with this change.  2) GERD: This is likely causing her cough at night and phlegm production. Omeprazole 20 mg nightly.  2) Type 2 IDDM: . She has no microalbuminuria noted on 11/01/11. Increase Levemir to 75 nightly. Check BG three times daily.  3) HTN: Well controlled. 4) PND/orthopnea: proBNP. Concerning for new onset HF. Order TTE. Start Lasix 20 mg daily for now. 5) Type 2 DM with peripheral neuropathy: Cont neurontin and elavil. Educated on  self foot exams.  6) Right shoulder pain: Improved, previously following with sports medicine. States her shoulder is doing well.  7) Morbid obesity: She understands her weight put her at higher risk for complications. She is trying to exercise. She is following diabetic meals. She is going to attend bariatric surgery option classes at [facility]. Body mass index is 56.79 kg/(m^2).  8) Health Maintenance: Eye exam done this year, 2013. Mammogram BIRADS 2, 03/2012. Colonoscopy in 2010, repeat in 5 years. PAP is due 2016.  Return to clinic 3 weeks.

## 2012-09-01 NOTE — Patient Instructions (Addendum)
Start lasix 20 mg a day. Increase Levemir 75 units at night. Ordered a picture of your heart to evaluate your swelling and shortness of breath. Blood work today. Return 3 weeks.

## 2012-09-02 ENCOUNTER — Telehealth: Payer: Self-pay | Admitting: *Deleted

## 2012-09-02 ENCOUNTER — Ambulatory Visit (HOSPITAL_COMMUNITY)
Admission: RE | Admit: 2012-09-02 | Discharge: 2012-09-02 | Disposition: A | Discharge status: Home / Self Care | Payer: Medicare Other | Source: Ambulatory Visit | Attending: Internal Medicine | Admitting: Internal Medicine

## 2012-09-02 ENCOUNTER — Other Ambulatory Visit: Payer: Self-pay | Admitting: Internal Medicine

## 2012-09-02 DIAGNOSIS — R609 Edema, unspecified: Secondary | ICD-10-CM

## 2012-09-02 DIAGNOSIS — R0600 Dyspnea, unspecified: Secondary | ICD-10-CM

## 2012-09-02 DIAGNOSIS — I10 Essential (primary) hypertension: Secondary | ICD-10-CM | POA: Insufficient documentation

## 2012-09-02 DIAGNOSIS — E119 Type 2 diabetes mellitus without complications: Secondary | ICD-10-CM | POA: Insufficient documentation

## 2012-09-02 DIAGNOSIS — I369 Nonrheumatic tricuspid valve disorder, unspecified: Secondary | ICD-10-CM

## 2012-09-02 MED ORDER — FUROSEMIDE 20 MG PO TABS: 20.0000 mg | ORAL_TABLET | Freq: Two times a day (BID) | ORAL | Status: DC

## 2012-09-02 NOTE — Progress Notes (Signed)
  Echocardiogram 2D Echocardiogram has been performed.  Kathleen [name] 09/02/2012, 11:35 AM

## 2012-09-02 NOTE — Telephone Encounter (Signed)
Pt leaves message that dr Murlean Caller called to speak w/ her would like him to call her at [phone]

## 2012-09-02 NOTE — Telephone Encounter (Signed)
I had already spoken to her, thanks.

## 2012-09-07 ENCOUNTER — Other Ambulatory Visit: Payer: Self-pay | Admitting: Internal Medicine

## 2012-09-07 DIAGNOSIS — R609 Edema, unspecified: Secondary | ICD-10-CM

## 2012-09-09 ENCOUNTER — Encounter: Payer: Self-pay | Admitting: Internal Medicine

## 2012-09-12 ENCOUNTER — Other Ambulatory Visit (INDEPENDENT_AMBULATORY_CARE_PROVIDER_SITE_OTHER): Payer: Medicare Other

## 2012-09-12 DIAGNOSIS — R609 Edema, unspecified: Secondary | ICD-10-CM

## 2012-09-12 LAB — BASIC METABOLIC PANEL WITH GFR
BUN: 28 mg/dL — ABNORMAL HIGH (ref 6–23)
Chloride: 99 mEq/L (ref 96–112)
Glucose, Bld: 260 mg/dL — ABNORMAL HIGH (ref 70–99)
Potassium: 4.2 mEq/L (ref 3.5–5.3)

## 2012-09-15 ENCOUNTER — Other Ambulatory Visit: Payer: Medicare Other

## 2012-09-15 ENCOUNTER — Encounter: Payer: Self-pay | Admitting: Internal Medicine

## 2012-09-16 ENCOUNTER — Other Ambulatory Visit: Payer: Self-pay | Admitting: Internal Medicine

## 2012-09-16 DIAGNOSIS — R0600 Dyspnea, unspecified: Secondary | ICD-10-CM

## 2012-09-16 DIAGNOSIS — R609 Edema, unspecified: Secondary | ICD-10-CM

## 2012-09-16 MED ORDER — FUROSEMIDE 20 MG PO TABS: 20.0000 mg | ORAL_TABLET | Freq: Every day | ORAL | Status: DC

## 2012-09-16 MED ORDER — MAGNESIUM OXIDE 400 MG PO CAPS: 400.0000 mg | ORAL_CAPSULE | Freq: Two times a day (BID) | ORAL | Status: DC

## 2012-09-29 ENCOUNTER — Encounter: Payer: Self-pay | Admitting: Internal Medicine

## 2012-09-29 ENCOUNTER — Ambulatory Visit (INDEPENDENT_AMBULATORY_CARE_PROVIDER_SITE_OTHER): Payer: Medicare Other | Admitting: Internal Medicine

## 2012-09-29 VITALS — BP 141/74 | HR 68 | Temp 97.3°F | Ht 60.0 in | Wt 291.9 lb

## 2012-09-29 DIAGNOSIS — G609 Hereditary and idiopathic neuropathy, unspecified: Secondary | ICD-10-CM

## 2012-09-29 DIAGNOSIS — E785 Hyperlipidemia, unspecified: Secondary | ICD-10-CM

## 2012-09-29 DIAGNOSIS — R0609 Other forms of dyspnea: Secondary | ICD-10-CM

## 2012-09-29 DIAGNOSIS — G629 Polyneuropathy, unspecified: Secondary | ICD-10-CM

## 2012-09-29 DIAGNOSIS — K219 Gastro-esophageal reflux disease without esophagitis: Secondary | ICD-10-CM

## 2012-09-29 DIAGNOSIS — K589 Irritable bowel syndrome without diarrhea: Secondary | ICD-10-CM

## 2012-09-29 DIAGNOSIS — R0989 Other specified symptoms and signs involving the circulatory and respiratory systems: Secondary | ICD-10-CM

## 2012-09-29 DIAGNOSIS — I10 Essential (primary) hypertension: Secondary | ICD-10-CM

## 2012-09-29 DIAGNOSIS — T44905A Adverse effect of unspecified drugs primarily affecting the autonomic nervous system, initial encounter: Secondary | ICD-10-CM

## 2012-09-29 DIAGNOSIS — R05 Cough: Secondary | ICD-10-CM

## 2012-09-29 DIAGNOSIS — R609 Edema, unspecified: Secondary | ICD-10-CM

## 2012-09-29 DIAGNOSIS — R0600 Dyspnea, unspecified: Secondary | ICD-10-CM

## 2012-09-29 DIAGNOSIS — E119 Type 2 diabetes mellitus without complications: Secondary | ICD-10-CM

## 2012-09-29 LAB — LIPID PANEL
Cholesterol: 119 mg/dL (ref 0–200)
HDL: 36 mg/dL — ABNORMAL LOW (ref >39)
Total CHOL/HDL Ratio: 3.3 Ratio

## 2012-09-29 LAB — BASIC METABOLIC PANEL WITH GFR
BUN: 16 mg/dL (ref 6–23)
Calcium: 9.4 mg/dL (ref 8.4–10.5)
GFR, Est African American: >89 mL/min
Glucose, Bld: 193 mg/dL — ABNORMAL HIGH (ref 70–99)

## 2012-09-29 LAB — GLUCOSE, CAPILLARY: Glucose-Capillary: 206 mg/dL — ABNORMAL HIGH (ref 70–99)

## 2012-09-29 MED ORDER — INSULIN DETEMIR 100 UNIT/ML ~~LOC~~ SOLN: SUBCUTANEOUS | Status: DC

## 2012-09-29 MED ORDER — FUROSEMIDE 20 MG PO TABS: 20.0000 mg | ORAL_TABLET | Freq: Every day | ORAL | Status: DC

## 2012-09-29 MED ORDER — LOSARTAN POTASSIUM 50 MG PO TABS: 50.0000 mg | ORAL_TABLET | Freq: Every day | ORAL | Status: DC

## 2012-09-29 NOTE — Progress Notes (Signed)
  Subjective:    Patient ID: Kathleen [name], female    DOB: 01-02-1951, 62 y.o.   MRN: [id number]  HPI Presents today for follow up.  States she's currently pending consult for weight loss surgery.   Reviewed glucose log. She is still above target 70% of the time.  No hypoglycemia.   Breathing is improved and edema is better. She reports a "hacking cough at night" that has not improved with addition of omeprazole, we discussed Lisinopril as a possible cause. She states mostly following a diabetic diet.  Review of Systems Complete 12 point ROS otherwise negative except for that stated in the HPI.    Objective:   Physical Exam Filed Vitals:   09/29/12 0827  BP: 141/74  Pulse: 68  Temp: 97.3 F (36.3 C)     GEN: AAOx3, NAD. HEENT: EOMI, PERRL, no icterus, no adenopathy. CV: S1S2, no m/r/g, RRR PULM: CTA bilat. ABD/GI: Obese, NT, +BS, no guarding, no distention. LE/UE: 2/4 pulses, no c/c. 1+ pitting edema. NEURO: CN II - XII grossly intact.     Assessment & Plan:  62 yr. Old female w/ pmhx significant for Chronic abdominal pain, likely IBS, Type 2 IDDM, Type 2 DM with peripheral neuropathy, HTN, HL, morbid obesity Body mass index is 55.88 kg/(m^2)., presents for follow up.  1) IBS/Chronic abdominal pain: Bentyl is helping her, she is happy with this change.  2) GERD: Continue Omeprazole 20 mg nightly.  2) Type 2 IDDM: . She has no microalbuminuria noted on 11/01/11. Increase Levemir to 80 nightly. Check BG three times daily.  3) HTN: Well controlled.  4) PND: TTE did not show evidence of LV dysfunction, but her breathing is improved. I suspect some diastolic dyfx, as this was a limited study. Cont Lasix 20 mg daily. Recheck BMP and Mg due to low Mg. 4) Dry cough: stop lisinopril.  Start losartan 50 mg day. 5) Type 2 DM with peripheral neuropathy: Cont neurontin and elavil. Educated on self foot exams.  6) Right shoulder pain: Improved, previously following with sports medicine. States  her shoulder is doing well.  7) Morbid obesity: She understands her weight put her at higher risk for complications. She is trying to exercise. She states she is mostly following diabetic meals. She is pending weight loss surgery consult. 8) Health Maintenance: Eye exam done this year, 2013. Mammogram BIRADS 2, 03/2012. Colonoscopy in 2010, repeat in 5 years. PAP is due 2016.  Return in 2 months.

## 2012-09-29 NOTE — Patient Instructions (Addendum)
Increase Levemir to 80 units nightly. Continue Lasix 20 mg daily. Blood work today. Return in 2 months. STOP lisinopril and start losartan, this was sent to your pharmacy.

## 2012-10-27 ENCOUNTER — Other Ambulatory Visit: Payer: Self-pay | Admitting: *Deleted

## 2012-10-27 DIAGNOSIS — R609 Edema, unspecified: Secondary | ICD-10-CM

## 2012-10-27 DIAGNOSIS — E785 Hyperlipidemia, unspecified: Secondary | ICD-10-CM

## 2012-10-27 DIAGNOSIS — E119 Type 2 diabetes mellitus without complications: Secondary | ICD-10-CM

## 2012-10-27 DIAGNOSIS — I10 Essential (primary) hypertension: Secondary | ICD-10-CM

## 2012-10-27 DIAGNOSIS — K589 Irritable bowel syndrome without diarrhea: Secondary | ICD-10-CM

## 2012-10-27 DIAGNOSIS — K219 Gastro-esophageal reflux disease without esophagitis: Secondary | ICD-10-CM

## 2012-10-27 DIAGNOSIS — G629 Polyneuropathy, unspecified: Secondary | ICD-10-CM

## 2012-10-27 DIAGNOSIS — R0600 Dyspnea, unspecified: Secondary | ICD-10-CM

## 2012-10-27 DIAGNOSIS — R05 Cough: Secondary | ICD-10-CM

## 2012-10-27 MED ORDER — AMITRIPTYLINE HCL 50 MG PO TABS: 25.0000 mg | ORAL_TABLET | Freq: Every evening | ORAL | Status: DC | PRN

## 2012-10-27 MED ORDER — FUROSEMIDE 20 MG PO TABS: 20.0000 mg | ORAL_TABLET | Freq: Every day | ORAL | Status: DC

## 2012-10-27 MED ORDER — OMEPRAZOLE 20 MG PO CPDR: 20.0000 mg | DELAYED_RELEASE_CAPSULE | Freq: Every day | ORAL | Status: DC

## 2012-10-27 MED ORDER — METFORMIN HCL 500 MG PO TABS: 500.0000 mg | ORAL_TABLET | Freq: Two times a day (BID) | ORAL | Status: DC

## 2012-10-27 MED ORDER — LOSARTAN POTASSIUM 50 MG PO TABS: 50.0000 mg | ORAL_TABLET | Freq: Every day | ORAL | Status: DC

## 2012-11-03 ENCOUNTER — Other Ambulatory Visit: Payer: Self-pay | Admitting: Internal Medicine

## 2012-11-09 ENCOUNTER — Other Ambulatory Visit: Payer: Self-pay | Admitting: *Deleted

## 2012-11-09 DIAGNOSIS — K589 Irritable bowel syndrome without diarrhea: Secondary | ICD-10-CM

## 2012-11-09 DIAGNOSIS — G629 Polyneuropathy, unspecified: Secondary | ICD-10-CM

## 2012-11-09 DIAGNOSIS — E119 Type 2 diabetes mellitus without complications: Secondary | ICD-10-CM

## 2012-11-09 DIAGNOSIS — R609 Edema, unspecified: Secondary | ICD-10-CM

## 2012-11-09 DIAGNOSIS — K219 Gastro-esophageal reflux disease without esophagitis: Secondary | ICD-10-CM

## 2012-11-09 DIAGNOSIS — I10 Essential (primary) hypertension: Secondary | ICD-10-CM

## 2012-11-09 DIAGNOSIS — R0600 Dyspnea, unspecified: Secondary | ICD-10-CM

## 2012-11-09 MED ORDER — GLUCOSE BLOOD VI STRP: ORAL_STRIP | Status: DC

## 2012-12-15 ENCOUNTER — Ambulatory Visit: Payer: Medicare Other | Admitting: Internal Medicine

## 2012-12-26 ENCOUNTER — Other Ambulatory Visit: Payer: Self-pay | Admitting: Internal Medicine

## 2013-01-24 ENCOUNTER — Other Ambulatory Visit: Payer: Self-pay | Admitting: Internal Medicine

## 2013-03-02 ENCOUNTER — Encounter: Payer: Medicare Other | Admitting: Internal Medicine

## 2013-03-16 ENCOUNTER — Encounter: Payer: Self-pay | Admitting: Internal Medicine

## 2013-03-16 ENCOUNTER — Ambulatory Visit (INDEPENDENT_AMBULATORY_CARE_PROVIDER_SITE_OTHER): Payer: Medicare Other | Admitting: Internal Medicine

## 2013-03-16 ENCOUNTER — Telehealth: Payer: Self-pay | Admitting: *Deleted

## 2013-03-16 VITALS — BP 140/63 | HR 65 | Temp 97.7°F | Ht 60.0 in | Wt 299.0 lb

## 2013-03-16 DIAGNOSIS — T464X5A Adverse effect of angiotensin-converting-enzyme inhibitors, initial encounter: Secondary | ICD-10-CM

## 2013-03-16 DIAGNOSIS — G629 Polyneuropathy, unspecified: Secondary | ICD-10-CM

## 2013-03-16 DIAGNOSIS — R05 Cough: Secondary | ICD-10-CM

## 2013-03-16 DIAGNOSIS — E119 Type 2 diabetes mellitus without complications: Secondary | ICD-10-CM

## 2013-03-16 DIAGNOSIS — T44905A Adverse effect of unspecified drugs primarily affecting the autonomic nervous system, initial encounter: Secondary | ICD-10-CM

## 2013-03-16 DIAGNOSIS — R609 Edema, unspecified: Secondary | ICD-10-CM

## 2013-03-16 DIAGNOSIS — Z23 Encounter for immunization: Secondary | ICD-10-CM

## 2013-03-16 DIAGNOSIS — G609 Hereditary and idiopathic neuropathy, unspecified: Secondary | ICD-10-CM

## 2013-03-16 DIAGNOSIS — K219 Gastro-esophageal reflux disease without esophagitis: Secondary | ICD-10-CM

## 2013-03-16 DIAGNOSIS — K589 Irritable bowel syndrome without diarrhea: Secondary | ICD-10-CM

## 2013-03-16 DIAGNOSIS — I10 Essential (primary) hypertension: Secondary | ICD-10-CM

## 2013-03-16 DIAGNOSIS — E785 Hyperlipidemia, unspecified: Secondary | ICD-10-CM

## 2013-03-16 DIAGNOSIS — R0600 Dyspnea, unspecified: Secondary | ICD-10-CM

## 2013-03-16 DIAGNOSIS — R0609 Other forms of dyspnea: Secondary | ICD-10-CM

## 2013-03-16 LAB — GLUCOSE, CAPILLARY: Glucose-Capillary: 277 mg/dL — ABNORMAL HIGH (ref 70–99)

## 2013-03-16 MED ORDER — FUROSEMIDE 20 MG PO TABS: 20.0000 mg | ORAL_TABLET | Freq: Every day | ORAL | Status: DC

## 2013-03-16 MED ORDER — INSULIN DETEMIR 100 UNIT/ML ~~LOC~~ SOLN: SUBCUTANEOUS | Status: DC

## 2013-03-16 MED ORDER — AMITRIPTYLINE HCL 50 MG PO TABS: ORAL_TABLET | ORAL | Status: DC

## 2013-03-16 MED ORDER — GLUCOSE BLOOD VI STRP: ORAL_STRIP | Status: DC

## 2013-03-16 MED ORDER — ZOSTER VACCINE LIVE 19400 UNT/0.65ML ~~LOC~~ SOLR: 0.6500 mL | Freq: Once | SUBCUTANEOUS | Status: DC

## 2013-03-16 MED ORDER — DICYCLOMINE HCL 20 MG PO TABS: 20.0000 mg | ORAL_TABLET | Freq: Three times a day (TID) | ORAL | Status: DC | PRN

## 2013-03-16 MED ORDER — GABAPENTIN 300 MG PO CAPS: ORAL_CAPSULE | ORAL | Status: DC

## 2013-03-16 MED ORDER — METFORMIN HCL 1000 MG PO TABS: 1000.0000 mg | ORAL_TABLET | Freq: Two times a day (BID) | ORAL | Status: DC

## 2013-03-16 MED ORDER — LOSARTAN POTASSIUM 50 MG PO TABS: 50.0000 mg | ORAL_TABLET | Freq: Every day | ORAL | Status: DC

## 2013-03-16 MED ORDER — ACCU-CHEK FASTCLIX LANCETS MISC: Status: DC

## 2013-03-16 MED ORDER — OMEPRAZOLE 20 MG PO CPDR: 20.0000 mg | DELAYED_RELEASE_CAPSULE | Freq: Every day | ORAL | Status: DC

## 2013-03-16 MED ORDER — "INSULIN SYRINGE 31G X 5/16"" 1 ML MISC": Status: DC

## 2013-03-16 NOTE — Patient Instructions (Signed)
-  Increase Levemir to 87 units daily. -Increase metformin to 1000 mg twice daily. -remember to get your shingles vaccine at your pharmacy. -all refills sent to you pharmacy. -flu shot today. -decrease salt use. -return in 3 months.

## 2013-03-16 NOTE — Telephone Encounter (Signed)
Pt is scheduled with an appointment with [facility]-Dr. Amalia Hailey on 04/03/2013 at 9:30 AM to arrive by 9:15 AM.  Pt was given an appointment card with the appointment information.  Sander Nephew, RN 03/16/2013 2:02 PM.

## 2013-03-16 NOTE — Progress Notes (Signed)
Subjective:    Patient ID: Kathleen [name], female    DOB: 26-Nov-1950, 62 y.o.   MRN: [id number]  HPI States overall feeling well.  Denies any CP or SOB. States she is checking her blood glucose everyday, mostly running in 200-300 range. The lowest is 120.  Reviewed her BG log, her average is 275. She has had no hypoglycemic events.   Review of Systems  Constitutional: Negative for chills, diaphoresis, activity change and appetite change.  HENT: Negative for hearing loss, mouth sores, sinus pressure and trouble swallowing.   Respiratory: Negative for cough, chest tightness, shortness of breath and wheezing.   Cardiovascular: Positive for leg swelling. Negative for chest pain and palpitations.  Gastrointestinal: Positive for nausea. Negative for vomiting, abdominal pain, blood in stool and abdominal distention.  Genitourinary: Negative for dysuria, frequency, hematuria, flank pain, vaginal bleeding, vaginal discharge, difficulty urinating and vaginal pain.  Neurological: Negative for dizziness, syncope, light-headedness, numbness and headaches.  Psychiatric/Behavioral: Negative for agitation.       Objective:   Physical Exam  Constitutional: She is oriented to person, place, and time. She appears well-developed and well-nourished.  HENT:  Head: Normocephalic and atraumatic.  Eyes: Conjunctivae and EOM are normal. Pupils are equal, round, and reactive to light.  Neck: Normal range of motion. Neck supple. No JVD present. No tracheal deviation present. No thyromegaly present.  Cardiovascular: Normal rate, regular rhythm, normal heart sounds and intact distal pulses.  Exam reveals no gallop and no friction rub.   No murmur heard. Pulmonary/Chest: Effort normal and breath sounds normal. No respiratory distress. She has no wheezes. She exhibits no tenderness.  Abdominal: Soft. Bowel sounds are normal. She exhibits no distension. There is no tenderness. There is no rebound.  Musculoskeletal:  Normal range of motion. She exhibits edema.  Lymphadenopathy:    She has no cervical adenopathy.  Neurological: She is alert and oriented to person, place, and time. She has normal reflexes. No cranial nerve deficit. Coordination normal.  Skin: Skin is warm and dry. No rash noted. She is not diaphoretic. No erythema.  Psychiatric: She has a normal mood and affect.   Filed Vitals:   03/16/13 0849  BP: 140/63  Pulse: 65  Temp: 97.7 F (36.5 C)        Assessment & Plan:  62 yr. Old female w/ pmhx significant for Chronic abdominal pain, likely IBS, Type 2 IDDM, Type 2 DM with peripheral neuropathy, HTN, HL, morbid obesity Body mass index is 55.88 kg/(m^2)., presents for follow up.  1) IBS/Chronic abdominal pain: Bentyl is helping her, she is happy with this medication. No further changes 2) GERD: Controlled on omeprazole.  2) Type 2 IDDM with diabetic retinopathy: She has no microalbuminuria noted on 11/01/11. She is still not in target, HgbA1C is 10.6%. Questionable adherence. Increase Levemir to 87 units nightly. Check BG three times daily. Increase metformin to 1000 mg twice daily. 3) HTN: She admits to a high salt diet. I educated her on need to cut down on salt, she agrees. She wants to try this before adding or increasing doses of her medication. 4) Type 2 DM with peripheral neuropathy: Cont neurontin and elavil. Educated on self foot exams.  5) Right shoulder pain: Improved, previously following with sports medicine. States her shoulder is doing well.  6) Morbid obesity: She understands her weight put her at higher risk for complications. She is trying to exercise. She is not following diabetic meals, she understands this is crucial and understands the  risks. She cannot afford bariatric surgery, so she has not continued to pursue this. Body mass index is 58.39 kg/(m^2). 7) Health Maintenance: Eye exam done this year, 2014 with Dr. Nehemiah Massed. I discussed the importance of diabetic control  due to retinopathy. Mammogram BIRADS 2, 05/2012. Colonoscopy in 2010, repeat in 5 years. PAP is due 2016. Zostavax sent to pharmacy.    Dominic Pea, DO, [facility] 03/16/2013, 9:31 AM

## 2013-04-03 ENCOUNTER — Encounter: Payer: Self-pay | Admitting: Podiatry

## 2013-04-03 ENCOUNTER — Ambulatory Visit (INDEPENDENT_AMBULATORY_CARE_PROVIDER_SITE_OTHER): Payer: Medicare Other | Admitting: Podiatry

## 2013-04-03 VITALS — BP 166/92 | HR 67 | Resp 12 | Ht 60.0 in | Wt 299.0 lb

## 2013-04-03 DIAGNOSIS — M79609 Pain in unspecified limb: Secondary | ICD-10-CM

## 2013-04-03 DIAGNOSIS — B351 Tinea unguium: Secondary | ICD-10-CM

## 2013-04-03 DIAGNOSIS — B353 Tinea pedis: Secondary | ICD-10-CM

## 2013-04-03 MED ORDER — ECONAZOLE NITRATE 1 % EX CREA: TOPICAL_CREAM | Freq: Every day | CUTANEOUS | Status: DC

## 2013-04-03 NOTE — Patient Instructions (Signed)
Apply topical antifungal cream daily x30 days to the skin on right and left feet. Upon completion of antifungal cream begin daily use of all-purpose skin lotion to the feet.

## 2013-04-03 NOTE — Progress Notes (Signed)
   Subjective:    Patient ID: Kathleen [name], female    DOB: 1950-05-28, 62 y.o.   MRN: [id number]  Diabetes She presents for her initial diabetic visit. She has type 2 diabetes mellitus. No MedicAlert identification noted. Hypoglycemia symptoms include dizziness and headaches. Associated symptoms include polyuria. There are no hypoglycemic complications. Symptoms are stable. (NEUROPATHY) There are no known risk factors for coronary artery disease. Current diabetic treatments: CREAM FOR DRY SKIN. She is compliant with treatment most of the time. There is no change in her home blood glucose trend.      Review of Systems  Endocrine: Positive for cold intolerance and polyuria.  Musculoskeletal: Positive for gait problem and joint swelling.  Neurological: Positive for dizziness, numbness and headaches.       Objective:   Physical Exam  Orientated x18 62 year old white female  Vascular: The DP and PT pulses are two over four bilaterally  Neurological: Sensation to 10 g monofilament wire intact one over 10 locations bilaterally. Vibratory sensation diminished bilaterally. Knee and ankle reflexes nonreactive bilaterally  Dermatological: Hypertrophic, incurvated, discolored toenails x10. Dry scaling skin immediately plantarly laterally with evidence of some dried vesicular areas.       Assessment & Plan:   Assessment: Diabetic peripheral sensory and motor neuropathy bilaterally Symptomatic onychomycoses x10 Tinea pedis bilaterally  Plan: Toenails are debrided back without any bleeding. Rx econazole 1% cream.

## 2013-04-17 ENCOUNTER — Other Ambulatory Visit: Payer: Self-pay | Admitting: Internal Medicine

## 2013-04-24 ENCOUNTER — Other Ambulatory Visit: Payer: Self-pay | Admitting: Internal Medicine

## 2013-04-25 NOTE — Telephone Encounter (Signed)
This was just filled on 11/20 with 3 refills.  I think she called in the wrong one.  Can you check on this please?

## 2013-04-25 NOTE — Telephone Encounter (Signed)
Called pt - stated she does not need Bentyl but Furosemide.  This was refilled last month w/refills; called [facility] - stated rx has been ready since last month, but did not pick up med. Pt was called /informed.

## 2013-04-26 ENCOUNTER — Ambulatory Visit (INDEPENDENT_AMBULATORY_CARE_PROVIDER_SITE_OTHER): Payer: Medicare Other | Admitting: Ophthalmology

## 2013-04-26 DIAGNOSIS — H35039 Hypertensive retinopathy, unspecified eye: Secondary | ICD-10-CM

## 2013-04-26 DIAGNOSIS — I10 Essential (primary) hypertension: Secondary | ICD-10-CM

## 2013-04-26 DIAGNOSIS — E11319 Type 2 diabetes mellitus with unspecified diabetic retinopathy without macular edema: Secondary | ICD-10-CM

## 2013-04-26 DIAGNOSIS — E1139 Type 2 diabetes mellitus with other diabetic ophthalmic complication: Secondary | ICD-10-CM

## 2013-04-26 DIAGNOSIS — H33309 Unspecified retinal break, unspecified eye: Secondary | ICD-10-CM

## 2013-04-26 DIAGNOSIS — H43819 Vitreous degeneration, unspecified eye: Secondary | ICD-10-CM

## 2013-04-26 LAB — HM DIABETES EYE EXAM

## 2013-05-12 ENCOUNTER — Other Ambulatory Visit (INDEPENDENT_AMBULATORY_CARE_PROVIDER_SITE_OTHER): Payer: Medicare Other | Admitting: Ophthalmology

## 2013-05-12 DIAGNOSIS — H3581 Retinal edema: Secondary | ICD-10-CM

## 2013-05-12 DIAGNOSIS — E1165 Type 2 diabetes mellitus with hyperglycemia: Secondary | ICD-10-CM

## 2013-05-12 DIAGNOSIS — E1139 Type 2 diabetes mellitus with other diabetic ophthalmic complication: Secondary | ICD-10-CM

## 2013-05-18 ENCOUNTER — Other Ambulatory Visit: Payer: Self-pay | Admitting: *Deleted

## 2013-05-18 ENCOUNTER — Other Ambulatory Visit: Payer: Self-pay

## 2013-05-18 DIAGNOSIS — I10 Essential (primary) hypertension: Secondary | ICD-10-CM

## 2013-05-18 DIAGNOSIS — E785 Hyperlipidemia, unspecified: Secondary | ICD-10-CM

## 2013-05-18 DIAGNOSIS — K589 Irritable bowel syndrome without diarrhea: Secondary | ICD-10-CM

## 2013-05-18 DIAGNOSIS — R0600 Dyspnea, unspecified: Secondary | ICD-10-CM

## 2013-05-18 DIAGNOSIS — E119 Type 2 diabetes mellitus without complications: Secondary | ICD-10-CM

## 2013-05-18 DIAGNOSIS — G629 Polyneuropathy, unspecified: Secondary | ICD-10-CM

## 2013-05-18 DIAGNOSIS — R05 Cough: Secondary | ICD-10-CM

## 2013-05-18 DIAGNOSIS — K219 Gastro-esophageal reflux disease without esophagitis: Secondary | ICD-10-CM

## 2013-05-18 DIAGNOSIS — Z23 Encounter for immunization: Secondary | ICD-10-CM

## 2013-05-18 DIAGNOSIS — Z1231 Encounter for screening mammogram for malignant neoplasm of breast: Secondary | ICD-10-CM

## 2013-05-18 DIAGNOSIS — R609 Edema, unspecified: Secondary | ICD-10-CM

## 2013-05-18 DIAGNOSIS — T464X5A Adverse effect of angiotensin-converting-enzyme inhibitors, initial encounter: Secondary | ICD-10-CM

## 2013-05-18 MED ORDER — GLUCOSE BLOOD VI STRP: ORAL_STRIP | Status: DC

## 2013-05-22 ENCOUNTER — Encounter (HOSPITAL_COMMUNITY): Payer: Self-pay | Admitting: Emergency Medicine

## 2013-05-22 ENCOUNTER — Inpatient Hospital Stay (HOSPITAL_COMMUNITY)
Admission: EM | Admit: 2013-05-22 | Discharge: 2013-05-25 | DRG: 189 | Disposition: A | Discharge status: Home / Self Care | Payer: Medicare Other | Attending: Internal Medicine | Admitting: Internal Medicine

## 2013-05-22 ENCOUNTER — Emergency Department (HOSPITAL_COMMUNITY): Payer: Medicare Other

## 2013-05-22 ENCOUNTER — Other Ambulatory Visit: Payer: Self-pay

## 2013-05-22 ENCOUNTER — Other Ambulatory Visit: Payer: Self-pay | Admitting: *Deleted

## 2013-05-22 DIAGNOSIS — R0600 Dyspnea, unspecified: Secondary | ICD-10-CM

## 2013-05-22 DIAGNOSIS — I4729 Other ventricular tachycardia: Secondary | ICD-10-CM | POA: Diagnosis not present

## 2013-05-22 DIAGNOSIS — I10 Essential (primary) hypertension: Secondary | ICD-10-CM | POA: Diagnosis present

## 2013-05-22 DIAGNOSIS — K219 Gastro-esophageal reflux disease without esophagitis: Secondary | ICD-10-CM

## 2013-05-22 DIAGNOSIS — T464X5A Adverse effect of angiotensin-converting-enzyme inhibitors, initial encounter: Secondary | ICD-10-CM

## 2013-05-22 DIAGNOSIS — G4733 Obstructive sleep apnea (adult) (pediatric): Secondary | ICD-10-CM | POA: Diagnosis present

## 2013-05-22 DIAGNOSIS — J9600 Acute respiratory failure, unspecified whether with hypoxia or hypercapnia: Principal | ICD-10-CM | POA: Diagnosis present

## 2013-05-22 DIAGNOSIS — I5031 Acute diastolic (congestive) heart failure: Secondary | ICD-10-CM | POA: Diagnosis present

## 2013-05-22 DIAGNOSIS — E669 Obesity, unspecified: Secondary | ICD-10-CM | POA: Diagnosis present

## 2013-05-22 DIAGNOSIS — R0789 Other chest pain: Secondary | ICD-10-CM | POA: Diagnosis present

## 2013-05-22 DIAGNOSIS — I472 Ventricular tachycardia, unspecified: Secondary | ICD-10-CM | POA: Diagnosis present

## 2013-05-22 DIAGNOSIS — E872 Acidosis, unspecified: Secondary | ICD-10-CM | POA: Diagnosis present

## 2013-05-22 DIAGNOSIS — R079 Chest pain, unspecified: Secondary | ICD-10-CM | POA: Diagnosis present

## 2013-05-22 DIAGNOSIS — E119 Type 2 diabetes mellitus without complications: Secondary | ICD-10-CM | POA: Diagnosis present

## 2013-05-22 DIAGNOSIS — I5043 Acute on chronic combined systolic (congestive) and diastolic (congestive) heart failure: Secondary | ICD-10-CM

## 2013-05-22 DIAGNOSIS — E662 Morbid (severe) obesity with alveolar hypoventilation: Secondary | ICD-10-CM | POA: Diagnosis present

## 2013-05-22 DIAGNOSIS — R609 Edema, unspecified: Secondary | ICD-10-CM

## 2013-05-22 DIAGNOSIS — E1169 Type 2 diabetes mellitus with other specified complication: Secondary | ICD-10-CM | POA: Diagnosis present

## 2013-05-22 DIAGNOSIS — Z6841 Body Mass Index (BMI) 40.0 and over, adult: Secondary | ICD-10-CM

## 2013-05-22 DIAGNOSIS — R109 Unspecified abdominal pain: Secondary | ICD-10-CM

## 2013-05-22 DIAGNOSIS — E785 Hyperlipidemia, unspecified: Secondary | ICD-10-CM

## 2013-05-22 DIAGNOSIS — E1149 Type 2 diabetes mellitus with other diabetic neurological complication: Secondary | ICD-10-CM | POA: Diagnosis present

## 2013-05-22 DIAGNOSIS — R05 Cough: Secondary | ICD-10-CM

## 2013-05-22 DIAGNOSIS — I498 Other specified cardiac arrhythmias: Secondary | ICD-10-CM | POA: Diagnosis present

## 2013-05-22 DIAGNOSIS — I509 Heart failure, unspecified: Secondary | ICD-10-CM | POA: Diagnosis present

## 2013-05-22 DIAGNOSIS — G4700 Insomnia, unspecified: Secondary | ICD-10-CM | POA: Diagnosis present

## 2013-05-22 DIAGNOSIS — G629 Polyneuropathy, unspecified: Secondary | ICD-10-CM

## 2013-05-22 DIAGNOSIS — Z79899 Other long term (current) drug therapy: Secondary | ICD-10-CM

## 2013-05-22 DIAGNOSIS — K589 Irritable bowel syndrome without diarrhea: Secondary | ICD-10-CM | POA: Diagnosis present

## 2013-05-22 DIAGNOSIS — G8929 Other chronic pain: Secondary | ICD-10-CM | POA: Diagnosis present

## 2013-05-22 DIAGNOSIS — Z23 Encounter for immunization: Secondary | ICD-10-CM

## 2013-05-22 DIAGNOSIS — I440 Atrioventricular block, first degree: Secondary | ICD-10-CM | POA: Diagnosis present

## 2013-05-22 DIAGNOSIS — E1142 Type 2 diabetes mellitus with diabetic polyneuropathy: Secondary | ICD-10-CM | POA: Diagnosis present

## 2013-05-22 DIAGNOSIS — I441 Atrioventricular block, second degree: Secondary | ICD-10-CM

## 2013-05-22 LAB — URINALYSIS, ROUTINE W REFLEX MICROSCOPIC
Glucose, UA: NEGATIVE mg/dL
Hgb urine dipstick: NEGATIVE
Ketones, ur: NEGATIVE mg/dL
Leukocytes, UA: NEGATIVE
Nitrite: NEGATIVE
Protein, ur: 100 mg/dL — AB
Specific Gravity, Urine: 1.024 (ref 1.005–1.030)
Urobilinogen, UA: 4 mg/dL — ABNORMAL HIGH (ref 0.0–1.0)
pH: 6 (ref 5.0–8.0)

## 2013-05-22 LAB — TROPONIN I: Troponin I: <0.3 ng/mL (ref <0.30)

## 2013-05-22 LAB — GLUCOSE, CAPILLARY: Glucose-Capillary: 82 mg/dL (ref 70–99)

## 2013-05-22 LAB — URINE MICROSCOPIC-ADD ON

## 2013-05-22 LAB — COMPREHENSIVE METABOLIC PANEL
ALT: 8 U/L (ref 0–35)
AST: 16 U/L (ref 0–37)
Albumin: 3 g/dL — ABNORMAL LOW (ref 3.5–5.2)
Alkaline Phosphatase: 108 U/L (ref 39–117)
BUN: 19 mg/dL (ref 6–23)
Chloride: 101 mEq/L (ref 96–112)
GFR calc Af Amer: 77 mL/min — ABNORMAL LOW (ref >90)
GFR calc non Af Amer: 67 mL/min — ABNORMAL LOW (ref >90)
Potassium: 4.1 mEq/L (ref 3.7–5.3)
Sodium: 142 mEq/L (ref 137–147)
Total Bilirubin: 0.8 mg/dL (ref 0.3–1.2)
Total Protein: 6.8 g/dL (ref 6.0–8.3)

## 2013-05-22 LAB — CBC WITH DIFFERENTIAL/PLATELET
Basophils Absolute: 0 10*3/uL (ref 0.0–0.1)
Basophils Relative: 0 % (ref 0–1)
Eosinophils Absolute: 0.2 10*3/uL (ref 0.0–0.7)
Eosinophils Relative: 2 % (ref 0–5)
HCT: 38.9 % (ref 36.0–46.0)
Hemoglobin: 11.8 g/dL — ABNORMAL LOW (ref 12.0–15.0)
Lymphocytes Relative: 24 % (ref 12–46)
Lymphs Abs: 1.9 K/uL (ref 0.7–4.0)
MCH: 27.6 pg (ref 26.0–34.0)
MCHC: 30.3 g/dL (ref 30.0–36.0)
MCV: 90.9 fL (ref 78.0–100.0)
Monocytes Absolute: 0.6 K/uL (ref 0.1–1.0)
Monocytes Relative: 7 % (ref 3–12)
Neutro Abs: 5.5 10*3/uL (ref 1.7–7.7)
Neutrophils Relative %: 67 % (ref 43–77)
Platelets: 209 10*3/uL (ref 150–400)
RBC: 4.28 MIL/uL (ref 3.87–5.11)
RDW: 15.4 % (ref 11.5–15.5)
WBC: 8.2 K/uL (ref 4.0–10.5)

## 2013-05-22 LAB — COMPREHENSIVE METABOLIC PANEL WITH GFR
CO2: 30 meq/L (ref 19–32)
Calcium: 8.4 mg/dL (ref 8.4–10.5)
Creatinine, Ser: 0.91 mg/dL (ref 0.50–1.10)
Glucose, Bld: 115 mg/dL — ABNORMAL HIGH (ref 70–99)

## 2013-05-22 LAB — PRO B NATRIURETIC PEPTIDE: Pro B Natriuretic peptide (BNP): 1253 pg/mL — ABNORMAL HIGH (ref 0–125)

## 2013-05-22 LAB — POCT I-STAT TROPONIN I: Troponin i, poc: 0 ng/mL (ref 0.00–0.08)

## 2013-05-22 LAB — LIPASE, BLOOD: Lipase: 12 U/L (ref 11–59)

## 2013-05-22 MED ORDER — FENTANYL CITRATE 0.05 MG/ML IJ SOLN
100.0000 ug | Freq: Once | INTRAMUSCULAR | Status: AC
  Administered 2013-05-22: 100 ug via INTRAMUSCULAR
  Filled 2013-05-22: qty 2

## 2013-05-22 MED ORDER — FUROSEMIDE 10 MG/ML IJ SOLN
40.0000 mg | Freq: Once | INTRAMUSCULAR | Status: AC
  Administered 2013-05-22: 40 mg via INTRAVENOUS
  Filled 2013-05-22: qty 4

## 2013-05-22 MED ORDER — FENTANYL CITRATE 0.05 MG/ML IJ SOLN
50.0000 ug | Freq: Once | INTRAMUSCULAR | Status: DC
Start: 2013-05-22 — End: 2013-05-22

## 2013-05-22 MED ORDER — ONDANSETRON 4 MG PO TBDP
8.0000 mg | ORAL_TABLET | Freq: Once | ORAL | Status: AC
  Administered 2013-05-22: 8 mg via ORAL
  Filled 2013-05-22: qty 2

## 2013-05-22 MED ORDER — GLUCOSE BLOOD VI STRP: ORAL_STRIP | Status: DC

## 2013-05-22 NOTE — ED Notes (Addendum)
Has been feeling bad x 3 months Numbness in rt leg x 3 weeks and  and fever and unable to eat x 3 days and cp that started mins ago pt arrives via ems to triage from home  pt able to stand has cough and states she vomited and has diarrhea

## 2013-05-22 NOTE — ED Notes (Signed)
Pt states about 3 weeks ago she was awaken out of sleep with jerking and Rt arm went numb. Pt states she has limited strength in rt arm. Pt states she has seen PCP since. Today around 12 noon pt states she was speaking with family and speech started to [place]. Pt states she has had rt eye surgery on this past Friday. C/o HA and lft leg week.

## 2013-05-22 NOTE — ED Notes (Signed)
Pt O2 sats 88-90 on room air.  Put pt on 2L O2 and is now sats are 99%

## 2013-05-22 NOTE — ED Notes (Signed)
MD at bedside. 

## 2013-05-22 NOTE — ED Provider Notes (Signed)
CSN: [id number]     Arrival date & time 05/22/13  1303 History   First MD Initiated Contact with Patient 05/22/13 1933     Chief Complaint  Patient presents with  . Emesis   (Consider location/radiation/quality/duration/timing/severity/associated sxs/prior Treatment) Patient is a 63 y.o. female presenting with cough and abdominal pain. The history is provided by the patient and a relative.  Cough Cough characteristics:  Productive Sputum characteristics:  Unable to specify Severity:  Moderate Duration:  2 days Timing:  Constant Chronicity:  New Smoker: no   Associated symptoms: chest pain, fever and shortness of breath   Associated symptoms: no headaches, no sore throat and no wheezing   Abdominal Pain Pain location:  LUQ and epigastric Pain quality: cramping and fullness   Pain radiates to:  Does not radiate Timing:  Intermittent Progression:  Waxing and waning Context: not alcohol use, not diet changes, not eating, not sick contacts and not suspicious food intake   Associated symptoms: chest pain, cough, fatigue, fever, nausea and shortness of breath   Associated symptoms: no diarrhea, no sore throat and no vomiting     Past Medical History  Diagnosis Date  . Irritable bowel syndrome   . Fecal occult blood test positive   . Degenerative joint disease of hand   . Obesity   . Post-menopausal bleeding   . Dyslipidemia   . Diabetes mellitus   . Inadequate material resources   . Postmenopausal    Past Surgical History  Procedure Laterality Date  . Cholecystectomy    . Other surgical history      right shoulder tendon repair 05/2011   Family History  Problem Relation Age of Onset  . Diabetes Mother   . Obesity Mother   . Stroke Father   . Diabetes Sister   . Colon cancer Neg Hx    History  Substance Use Topics  . Smoking status: Never Smoker   . Smokeless tobacco: Never Used  . Alcohol Use: No   OB History   Grav Para Term Preterm Abortions TAB SAB Ect Mult  Living                 Review of Systems  Constitutional: Positive for fever, appetite change and fatigue.  HENT: Negative for congestion, sore throat and trouble swallowing.   Respiratory: Positive for cough and shortness of breath. Negative for wheezing.   Cardiovascular: Positive for chest pain and leg swelling. Negative for palpitations.  Gastrointestinal: Positive for nausea and abdominal pain. Negative for vomiting and diarrhea.  Musculoskeletal: Negative for back pain.  Neurological: Negative for weakness and headaches.  All other systems reviewed and are negative.    Allergies  Doxycycline  Home Medications   Current Outpatient Rx  Name  Route  Sig  Dispense  Refill  . amitriptyline (ELAVIL) 50 MG tablet      TAKE 0.5 TABLETS (25 MG TOTAL) BY MOUTH AT BEDTIME AS NEEDED FOR SLEEP.   15 tablet   3   . dicyclomine (BENTYL) 20 MG tablet   Oral   Take 20 mg by mouth 3 (three) times daily as needed for spasms.         Marland Kitchen dicyclomine (BENTYL) 20 MG tablet      TAKE 1 TABLET (20 MG TOTAL) BY MOUTH 3 (THREE) TIMES DAILY AS NEEDED.         Marland Kitchen econazole nitrate 1 % cream   Topical   Apply topically daily.   85 g  0   . furosemide (LASIX) 20 MG tablet   Oral   Take 1 tablet (20 mg total) by mouth daily.   90 tablet   2   . gabapentin (NEURONTIN) 300 MG capsule      TAKE ONE CAPSULE BY MOUTH 3 TIMES A DAY   270 capsule   2   . insulin detemir (LEVEMIR) 100 UNIT/ML injection      Inject 87 units at bedtime   30 mL   5   . losartan (COZAAR) 50 MG tablet   Oral   Take 1 tablet (50 mg total) by mouth daily.   90 tablet   3   . Magnesium Oxide 400 MG CAPS   Oral   Take 400 mg by mouth 2 (two) times daily. For 14 days. Completed course of medication on 05-15-13         . metFORMIN (GLUCOPHAGE) 1000 MG tablet   Oral   Take 1 tablet (1,000 mg total) by mouth 2 (two) times daily with a meal.   180 tablet   3   . omeprazole (PRILOSEC) 20 MG capsule    Oral   Take 1 capsule (20 mg total) by mouth daily.   90 capsule   1   . prednisoLONE acetate (PRED FORTE) 1 % ophthalmic suspension   Right Eye   Place 1 drop into the right eye 4 (four) times daily.         Marland Kitchen glucose blood (ACCU-CHEK SMARTVIEW) test strip      Use to test blood glucose 3 times daily. Dx: 250.00. Insulin dependent   100 each   6    BP 133/70  Pulse 63  Temp(Src) 98.4 F (36.9 C) (Oral)  Resp 22  SpO2 96% Physical Exam  Nursing note and vitals reviewed. Constitutional: She appears well-developed and well-nourished. No distress.  HENT:  Head: Normocephalic and atraumatic.  Cardiovascular: Normal rate and intact distal pulses.  An irregular rhythm present.  Occasional extrasystoles are present. Exam reveals no decreased pulses.   Pulmonary/Chest: Effort normal. She has no wheezes.  Abdominal: Soft. There is tenderness. There is no rebound and no guarding.  Neurological: She is alert.  Skin: Skin is warm.    ED Course  Procedures (including critical care time) Labs Review Labs Reviewed  CBC WITH DIFFERENTIAL - Abnormal; Notable for the following:    Hemoglobin 11.8 (*)    All other components within normal limits  COMPREHENSIVE METABOLIC PANEL - Abnormal; Notable for the following:    Glucose, Bld 115 (*)    Albumin 3.0 (*)    GFR calc non Af Amer 67 (*)    GFR calc Af Amer 77 (*)    All other components within normal limits  URINALYSIS, ROUTINE W REFLEX MICROSCOPIC - Abnormal; Notable for the following:    Color, Urine AMBER (*)    APPearance HAZY (*)    Bilirubin Urine SMALL (*)    Protein, ur 100 (*)    Urobilinogen, UA 4.0 (*)    All other components within normal limits  URINE MICROSCOPIC-ADD ON - Abnormal; Notable for the following:    Squamous Epithelial / LPF FEW (*)    Bacteria, UA MANY (*)    All other components within normal limits  PRO B NATRIURETIC PEPTIDE - Abnormal; Notable for the following:    Pro B Natriuretic peptide (BNP)  1253.0 (*)    All other components within normal limits  LIPASE, BLOOD  GLUCOSE, CAPILLARY  Imaging Review Dg Chest 2 View  05/22/2013   CLINICAL DATA:  Cough and shortness of breath.  EXAM: CHEST  2 VIEW  COMPARISON:  PA and lateral chest 11/05/2007.  FINDINGS: There are small bilateral pleural effusions. Cardiomegaly and interstitial edema are identified. No pneumothorax.  IMPRESSION: Interstitial pulmonary edema with associated small bilateral pleural effusions.   Electronically Signed   By: Inge Rise M.D.   On: 05/22/2013 21:12    EKG Interpretation    Date/Time:  Monday May 22 2013 20:25:31 EST Ventricular Rate:  57 PR Interval:    QRS Duration: 80 QT Interval:  452 QTC Calculation: 439 R Axis:   -35 Text Interpretation:  Sinus rhythm with 2nd degree A-V block Left axis deviation Low voltage QRS Cannot rule out Anteroseptal infarct , age undetermined Abnormal ECG Confirmed by [facility]  MD, MICHEAL ([id number]) on 05/22/2013 9:25:26 PM           RA sat is 100% and I interpret to be normal  11:10 PM Troponin is normal.  I spoke to cardiology fellow who reviewed ECG and believes there is some first degree heart block with intermittent 2nd degree heart block, type I.  However, I think admission, especially in light of CP for admission to r/o and to keep on telemetry monitoring.  CXR suggest mild interstitial edema.  BNP is also pending.       MDM   1. Second degree heart block   2. Chest pain      Pt with abn ECG, difficult to tell if heart block versus slow atrial fib.  No h/o either.  Pt with CP in setting of cough, some upper abd pain as well without frank vomiting, no diarrhea.  No fever here, just subjective at home.  Pt with h/o DM, no known CAD.  Will check electrolytes, likely needs admission.     Saddie Benders. Dorna Mai, MD 05/22/13 2312

## 2013-05-22 NOTE — ED Notes (Signed)
Patient transported to CT 

## 2013-05-22 NOTE — ED Notes (Signed)
CBG was fone on pt with a result of 82 and RN was notified of results.

## 2013-05-23 DIAGNOSIS — R0609 Other forms of dyspnea: Secondary | ICD-10-CM

## 2013-05-23 DIAGNOSIS — I441 Atrioventricular block, second degree: Secondary | ICD-10-CM

## 2013-05-23 DIAGNOSIS — R0989 Other specified symptoms and signs involving the circulatory and respiratory systems: Secondary | ICD-10-CM

## 2013-05-23 DIAGNOSIS — J9600 Acute respiratory failure, unspecified whether with hypoxia or hypercapnia: Principal | ICD-10-CM | POA: Diagnosis present

## 2013-05-23 DIAGNOSIS — R079 Chest pain, unspecified: Secondary | ICD-10-CM

## 2013-05-23 DIAGNOSIS — I10 Essential (primary) hypertension: Secondary | ICD-10-CM

## 2013-05-23 DIAGNOSIS — E669 Obesity, unspecified: Secondary | ICD-10-CM

## 2013-05-23 LAB — TROPONIN I: Troponin I: <0.3 ng/mL (ref <0.30)

## 2013-05-23 LAB — BASIC METABOLIC PANEL
BUN: 17 mg/dL (ref 6–23)
CHLORIDE: 100 meq/L (ref 96–112)
CO2: 29 mEq/L (ref 19–32)
Calcium: 8.2 mg/dL — ABNORMAL LOW (ref 8.4–10.5)
Creatinine, Ser: 0.87 mg/dL (ref 0.50–1.10)
GFR calc Af Amer: 82 mL/min — ABNORMAL LOW (ref >90)
GFR, EST NON AFRICAN AMERICAN: 70 mL/min — AB (ref >90)
Glucose, Bld: 86 mg/dL (ref 70–99)
POTASSIUM: 3.9 meq/L (ref 3.7–5.3)
SODIUM: 141 meq/L (ref 137–147)

## 2013-05-23 LAB — GLUCOSE, CAPILLARY
GLUCOSE-CAPILLARY: 134 mg/dL — AB (ref 70–99)
GLUCOSE-CAPILLARY: 162 mg/dL — AB (ref 70–99)
Glucose-Capillary: 97 mg/dL (ref 70–99)
Glucose-Capillary: 202 mg/dL — ABNORMAL HIGH (ref 70–99)

## 2013-05-23 LAB — CBC
HEMATOCRIT: 36.4 % (ref 36.0–46.0)
HEMOGLOBIN: 11.1 g/dL — AB (ref 12.0–15.0)
MCH: 28 pg (ref 26.0–34.0)
MCHC: 30.5 g/dL (ref 30.0–36.0)
MCV: 91.7 fL (ref 78.0–100.0)
Platelets: 216 10*3/uL (ref 150–400)
RBC: 3.97 MIL/uL (ref 3.87–5.11)
RDW: 15.3 % (ref 11.5–15.5)
WBC: 7.9 10*3/uL (ref 4.0–10.5)

## 2013-05-23 LAB — POCT I-STAT 3, ART BLOOD GAS (G3+)
Acid-Base Excess: 5 mmol/L — ABNORMAL HIGH (ref 0.0–2.0)
Bicarbonate: 32.5 mEq/L — ABNORMAL HIGH (ref 20.0–24.0)
O2 Saturation: 97 %
PH ART: 7.354 (ref 7.350–7.450)
PO2 ART: 98 mmHg (ref 80.0–100.0)
Patient temperature: 98.4
TCO2: 34 mmol/L (ref 0–100)
pCO2 arterial: 58.2 mmHg (ref 35.0–45.0)

## 2013-05-23 LAB — TSH: TSH: 3.941 u[IU]/mL (ref 0.350–4.500)

## 2013-05-23 LAB — INFLUENZA PANEL BY PCR (TYPE A & B)
H1N1 flu by pcr: NOT DETECTED
INFLAPCR: NEGATIVE
Influenza B By PCR: NEGATIVE

## 2013-05-23 LAB — MRSA PCR SCREENING: MRSA by PCR: NEGATIVE

## 2013-05-23 LAB — MAGNESIUM: MAGNESIUM: 1.7 mg/dL (ref 1.5–2.5)

## 2013-05-23 MED ORDER — GABAPENTIN 300 MG PO CAPS
300.0000 mg | ORAL_CAPSULE | Freq: Three times a day (TID) | ORAL | Status: DC
  Administered 2013-05-23 – 2013-05-25 (×7): 300 mg via ORAL
  Filled 2013-05-23 (×11): qty 1

## 2013-05-23 MED ORDER — MORPHINE SULFATE 2 MG/ML IJ SOLN: 2.0000 mg | INTRAMUSCULAR | Status: DC | PRN

## 2013-05-23 MED ORDER — NITROGLYCERIN 0.4 MG SL SUBL: 0.4000 mg | SUBLINGUAL_TABLET | SUBLINGUAL | Status: DC | PRN

## 2013-05-23 MED ORDER — LOSARTAN POTASSIUM 50 MG PO TABS
50.0000 mg | ORAL_TABLET | Freq: Every day | ORAL | Status: DC
  Administered 2013-05-23 – 2013-05-25 (×3): 50 mg via ORAL
  Filled 2013-05-23 (×3): qty 1

## 2013-05-23 MED ORDER — ALBUTEROL SULFATE (2.5 MG/3ML) 0.083% IN NEBU: 2.5000 mg | INHALATION_SOLUTION | RESPIRATORY_TRACT | Status: DC | PRN

## 2013-05-23 MED ORDER — INSULIN DETEMIR 100 UNIT/ML ~~LOC~~ SOLN
60.0000 [IU] | Freq: Every day | SUBCUTANEOUS | Status: DC
  Administered 2013-05-23 – 2013-05-24 (×2): 60 [IU] via SUBCUTANEOUS
  Filled 2013-05-23 (×3): qty 0.6

## 2013-05-23 MED ORDER — ASPIRIN EC 81 MG PO TBEC
81.0000 mg | DELAYED_RELEASE_TABLET | Freq: Every day | ORAL | Status: DC
  Administered 2013-05-23 – 2013-05-25 (×3): 81 mg via ORAL
  Filled 2013-05-23 (×3): qty 1

## 2013-05-23 MED ORDER — PANTOPRAZOLE SODIUM 40 MG PO TBEC
40.0000 mg | DELAYED_RELEASE_TABLET | Freq: Every day | ORAL | Status: DC
  Administered 2013-05-23 – 2013-05-25 (×2): 40 mg via ORAL
  Filled 2013-05-23 (×2): qty 1

## 2013-05-23 MED ORDER — ONDANSETRON HCL 4 MG PO TABS: 4.0000 mg | ORAL_TABLET | Freq: Three times a day (TID) | ORAL | Status: DC | PRN

## 2013-05-23 MED ORDER — INSULIN ASPART 100 UNIT/ML ~~LOC~~ SOLN
0.0000 [IU] | Freq: Three times a day (TID) | SUBCUTANEOUS | Status: DC
Start: 2013-05-23 — End: 2013-05-25
  Administered 2013-05-23: 18:00:00 via SUBCUTANEOUS
  Administered 2013-05-24 (×2): 5 [IU] via SUBCUTANEOUS
  Administered 2013-05-25 (×2): 3 [IU] via SUBCUTANEOUS

## 2013-05-23 MED ORDER — OSELTAMIVIR PHOSPHATE 75 MG PO CAPS
75.0000 mg | ORAL_CAPSULE | Freq: Two times a day (BID) | ORAL | Status: DC
  Administered 2013-05-23 (×2): 75 mg via ORAL
  Filled 2013-05-23 (×3): qty 1

## 2013-05-23 MED ORDER — ENOXAPARIN SODIUM 40 MG/0.4ML ~~LOC~~ SOLN
40.0000 mg | SUBCUTANEOUS | Status: DC
  Administered 2013-05-23 – 2013-05-25 (×3): 40 mg via SUBCUTANEOUS
  Filled 2013-05-23 (×3): qty 0.4

## 2013-05-23 MED ORDER — SODIUM CHLORIDE 0.9 % IJ SOLN
3.0000 mL | Freq: Two times a day (BID) | INTRAMUSCULAR | Status: DC
  Administered 2013-05-23 – 2013-05-25 (×4): 3 mL via INTRAVENOUS

## 2013-05-23 MED ORDER — PREDNISOLONE ACETATE 1 % OP SUSP
1.0000 [drp] | Freq: Four times a day (QID) | OPHTHALMIC | Status: DC
  Administered 2013-05-24 – 2013-05-25 (×4): 1 [drp] via OPHTHALMIC
  Filled 2013-05-23 (×2): qty 1

## 2013-05-23 MED ORDER — SODIUM CHLORIDE 0.9 % IJ SOLN: 3.0000 mL | INTRAMUSCULAR | Status: DC | PRN

## 2013-05-23 MED ORDER — SODIUM CHLORIDE 0.9 % IJ SOLN
3.0000 mL | Freq: Two times a day (BID) | INTRAMUSCULAR | Status: DC
  Administered 2013-05-23 (×2): 3 mL via INTRAVENOUS

## 2013-05-23 MED ORDER — REGADENOSON 0.4 MG/5ML IV SOLN
0.4000 mg | Freq: Once | INTRAVENOUS | Status: DC
  Filled 2013-05-23: qty 5

## 2013-05-23 MED ORDER — SODIUM CHLORIDE 0.9 % IV SOLN: 250.0000 mL | INTRAVENOUS | Status: DC | PRN

## 2013-05-23 MED ORDER — FUROSEMIDE 10 MG/ML IJ SOLN
40.0000 mg | Freq: Every day | INTRAMUSCULAR | Status: DC
  Administered 2013-05-23 – 2013-05-25 (×3): 40 mg via INTRAVENOUS
  Filled 2013-05-23 (×3): qty 4

## 2013-05-23 NOTE — Progress Notes (Signed)
Per nuclear medicine, stress test unable to be done due to pt's size; Dr. Acie Fredrickson made aware.

## 2013-05-23 NOTE — H&P (Signed)
Internal Medicine Attending Admission Note Date: 05/23/2013  Patient name: Kathleen [name] Medical record number: [id number] Date of birth: 1950-06-07 Age: 63 y.o. Gender: female  I saw and evaluated the patient. I reviewed the resident's note and I agree with the resident's findings and plan as documented in the resident's note, with the following additional comments.  Chief Complaint(s): Shortness of breath; chest pain  History - key components related to admission: Patient is a 63 year old woman with history of morbid obesity, type 2 diabetes mellitus, hypertension, hyperlipidemia, and other problems as outlined in medical history, admitted with worsening dyspnea with exertion and at rest, orthopnea, nonradiating left parasternal chest pain, and bilateral leg edema over the past week.  Physical Exam - key components related to admission:  Filed Vitals:   05/23/13 0230 05/23/13 0318 05/23/13 0500 05/23/13 1400  BP: 103/86 130/58  106/54  Pulse: 55 61  52  Temp:  98.1 F (36.7 C)  98.1 F (36.7 C)  TempSrc:  Oral  Oral  Resp: 17 18  18   Height:  5' (1.524 m)    Weight:  316 lb 12.8 oz (143.7 kg) 316 lb 12.8 oz (143.7 kg)   SpO2: 96% 97%  96%    Wt Readings from Last 6 Encounters:  05/23/13 316 lb 12.8 oz (143.7 kg)  04/03/13 299 lb (135.626 kg)  03/16/13 299 lb (135.626 kg)  09/29/12 291 lb 14.4 oz (132.405 kg)  09/01/12 290 lb 12.8 oz (131.906 kg)  05/12/12 284 lb 6.4 oz (129.003 kg)     General: Alert, oriented Lungs: Distant breath sounds; bibasilar crackles; mild wheezing Heart: Irregular; no obvious murmurs or extra sounds Abdomen: Bowel sounds present, soft, nontender Extremities: 2+ bilateral pitting edema  Lab results:   Basic Metabolic Panel:  Recent Labs  05/22/13 1329 05/23/13 0705  NA 142 141  K 4.1 3.9  CL 101 100  CO2 30 29  GLUCOSE 115* 86  BUN 19 17  CREATININE 0.91 0.87  CALCIUM 8.4 8.2*  MG  --  1.7    Liver Function Tests:  Recent  Labs  05/22/13 1329  AST 16  ALT 8  ALKPHOS 108  BILITOT 0.8  PROT 6.8  ALBUMIN 3.0*    Recent Labs  05/22/13 1329  LIPASE 12     CBC:  Recent Labs  05/22/13 1329 05/23/13 0705  WBC 8.2 7.9  HGB 11.8* 11.1*  HCT 38.9 36.4  MCV 90.9 91.7  PLT 209 216    Recent Labs  05/22/13 1329  NEUTROABS 5.5  LYMPHSABS 1.9  MONOABS 0.6  EOSABS 0.2  BASOSABS 0.0    Cardiac Enzymes:  Recent Labs  05/22/13 2326 05/23/13 1000 05/23/13 1225  TROPONINI <0.30 <0.30 <0.30    BNP:  Recent Labs  05/22/13 2153  PROBNP 1253.0*     CBG:  Recent Labs  05/22/13 2157 05/23/13 0621 05/23/13 1135  GLUCAP 82 97 134*     Thyroid Function Tests:  Recent Labs  05/23/13 0705  TSH 3.941    Urinalysis    Component Value Date/Time   COLORURINE AMBER* 05/22/2013 1826   APPEARANCEUR HAZY* 05/22/2013 1826   LABSPEC 1.024 05/22/2013 1826   PHURINE 6.0 05/22/2013 1826   GLUCOSEU NEGATIVE 05/22/2013 1826   HGBUR NEGATIVE 05/22/2013 1826   BILIRUBINUR SMALL* 05/22/2013 1826   KETONESUR NEGATIVE 05/22/2013 1826   PROTEINUR 100* 05/22/2013 1826   UROBILINOGEN 4.0* 05/22/2013 1826   NITRITE NEGATIVE 05/22/2013 1826   LEUKOCYTESUR NEGATIVE 05/22/2013 1826  Urine microscopic:  Recent Labs  05/22/13 1826  EPIU FEW*  WBCU 0-2  RBCU 0-2  BACTERIA MANY*     Imaging results:  Dg Chest 2 View  05/22/2013   CLINICAL DATA:  Cough and shortness of breath.  EXAM: CHEST  2 VIEW  COMPARISON:  PA and lateral chest 11/05/2007.  FINDINGS: There are small bilateral pleural effusions. Cardiomegaly and interstitial edema are identified. No pneumothorax.  IMPRESSION: Interstitial pulmonary edema with associated small bilateral pleural effusions.   Electronically Signed   By: Inge Rise M.D.   On: 05/22/2013 21:12    Other results: EKG: Junctional rhythm; low voltage QRS; septal infarct , age undetermined; possible lateral infarct , age undetermined   Assessment & Plan by  Problem:  1.   Dyspnea/orthopnea.  The clinical presentation of dyspnea/orthopnea, increasing weight, lower extremity edema, along with interstitial pulmonary edema by chest x-ray suggests volume overload and congestive heart failure as a cause of her symptoms.  The arterial blood gas is consistent with compensated chronic respiratory acidosis, which may be related to her morbid obesity.  Plan is diurese; follow ins and outs, weights, electrolytes, and renal function.    2.  Atypical chest pain.  No evidence of acute coronary syndrome by initial cardiac enzymes.  Cardiology consult was obtained, and Dr. Acie Fredrickson saw patient.  He planned to obtain a Myoview, but patient is apparently too big to have the study done.  Her obesity limits noninvasive workup; can discuss options with Dr. Acie Fredrickson.    3.  Possible obstructive sleep apnea.  Outpatient sleep study is warranted .  4.  Other problems and plans as per the resident physician's note.

## 2013-05-23 NOTE — Progress Notes (Signed)
Pt was of of O2 for approx. 15 min and then desated to 74%. 4L O2 was applied and ABG was draw approx 5 min later.

## 2013-05-23 NOTE — Consult Note (Addendum)
CONSULT NOTE  Date: 05/23/2013               Patient Name:  Kathleen [name] MRN: [id number]  DOB: 02-Apr-1951 Age / Sex: 63 y.o., female        PCP: Dominic Pea Primary Cardiologist: Reola Calkins / Nelida Mandarino            Referring Physician: Bertha Stakes, MD ( Med Teaching service)               Reason for Consult:  chest discomfort            History of Present Illness: Patient is a 63 y.o. female with a PMHx of hypertension, morbid obesity, type 2 diabetes mellitus, irritable bowel syndrome, hyperlipidemia, who was admitted to [facility] on 05/22/2013 for evaluation of chest discomfort.  She has stabbing like CP / inability to catch her breath - worse with  while lying down.  The pains were stabbing pain., lasted few minutes then lasted all night long.  She also had some heart palpitations ( fluttering in her chest ) .  Marland Kitchen The patient complains of right upper quadrant pain that is similar to her presenting symptoms. She's had a history of melena. She reports chronic diarrhea ( has IBS)  She is not able to breath while lying down.  She snores and has apneic episodes according to son who was visiting her several weeks ago.  I suspect she has sleep apnea.      Medications: Outpatient medications: Prescriptions prior to admission  Medication Sig Dispense Refill  . amitriptyline (ELAVIL) 50 MG tablet TAKE 0.5 TABLETS (25 MG TOTAL) BY MOUTH AT BEDTIME AS NEEDED FOR SLEEP.  15 tablet  3  . dicyclomine (BENTYL) 20 MG tablet Take 20 mg by mouth 3 (three) times daily as needed for spasms.      Marland Kitchen dicyclomine (BENTYL) 20 MG tablet TAKE 1 TABLET (20 MG TOTAL) BY MOUTH 3 (THREE) TIMES DAILY AS NEEDED.      Marland Kitchen econazole nitrate 1 % cream Apply topically daily.  85 g  0  . furosemide (LASIX) 20 MG tablet Take 1 tablet (20 mg total) by mouth daily.  90 tablet  2  . gabapentin (NEURONTIN) 300 MG capsule TAKE ONE CAPSULE BY MOUTH 3 TIMES A DAY  270 capsule  2  . insulin detemir (LEVEMIR) 100 UNIT/ML  injection Inject 87 units at bedtime  30 mL  5  . losartan (COZAAR) 50 MG tablet Take 1 tablet (50 mg total) by mouth daily.  90 tablet  3  . metFORMIN (GLUCOPHAGE) 1000 MG tablet Take 1 tablet (1,000 mg total) by mouth 2 (two) times daily with a meal.  180 tablet  3  . omeprazole (PRILOSEC) 20 MG capsule Take 1 capsule (20 mg total) by mouth daily.  90 capsule  1  . prednisoLONE acetate (PRED FORTE) 1 % ophthalmic suspension Place 1 drop into the right eye 4 (four) times daily.      Marland Kitchen glucose blood (ACCU-CHEK SMARTVIEW) test strip Use to test blood glucose 3 times daily. Dx: 250.00. Insulin dependent  100 each  6    Current medications: Current Facility-Administered Medications  Medication Dose Route Frequency Provider Last Rate Last Dose  . 0.9 %  sodium chloride infusion  250 mL Intravenous PRN Blain Pais, MD      . albuterol (PROVENTIL) (2.5 MG/3ML) 0.083% nebulizer solution 2.5 mg  2.5 mg Nebulization Q2H PRN Blain Pais, MD      .  aspirin EC tablet 81 mg  81 mg Oral Daily Blain Pais, MD      . enoxaparin (LOVENOX) injection 40 mg  40 mg Subcutaneous Q24H Blain Pais, MD      . furosemide (LASIX) injection 40 mg  40 mg Intravenous Daily Blain Pais, MD      . gabapentin (NEURONTIN) capsule 300 mg  300 mg Oral TID Blain Pais, MD      . insulin aspart (novoLOG) injection 0-15 Units  0-15 Units Subcutaneous TID WC Blain Pais, MD      . insulin detemir (LEVEMIR) injection 60 Units  60 Units Subcutaneous QHS Blain Pais, MD      . losartan (COZAAR) tablet 50 mg  50 mg Oral Daily Blain Pais, MD      . morphine 2 MG/ML injection 2 mg  2 mg Intravenous Q4H PRN Marrion Coy, MD      . nitroGLYCERIN (NITROSTAT) SL tablet 0.4 mg  0.4 mg Sublingual Q5 min PRN Marrion Coy, MD      . ondansetron [facility]) tablet 4 mg  4 mg Oral Q8H PRN Blain Pais, MD      . oseltamivir (TAMIFLU) capsule 75 mg  75 mg  Oral BID Blain Pais, MD   75 mg at 05/23/13 0216  . pantoprazole (PROTONIX) EC tablet 40 mg  40 mg Oral Daily Blain Pais, MD      . prednisoLONE acetate (PRED FORTE) 1 % ophthalmic suspension 1 drop  1 drop Right Eye QID Blain Pais, MD      . sodium chloride 0.9 % injection 3 mL  3 mL Intravenous Q12H Blain Pais, MD      . sodium chloride 0.9 % injection 3 mL  3 mL Intravenous Q12H Blain Pais, MD      . sodium chloride 0.9 % injection 3 mL  3 mL Intravenous PRN Blain Pais, MD         Allergies  Allergen Reactions  . Doxycycline     REACTION: Wheals and pruritus     Past Medical History  Diagnosis Date  . Irritable bowel syndrome   . Fecal occult blood test positive   . Degenerative joint disease of hand   . Obesity   . Post-menopausal bleeding   . Dyslipidemia   . Diabetes mellitus   . Inadequate material resources   . Postmenopausal     Past Surgical History  Procedure Laterality Date  . Cholecystectomy    . Other surgical history      right shoulder tendon repair 05/2011    Family History  Problem Relation Age of Onset  . Diabetes Mother   . Obesity Mother   . Stroke Father   . Diabetes Sister   . Colon cancer Neg Hx     Social History:  reports that she has never smoked. She has never used smokeless tobacco. She reports that she does not drink alcohol or use illicit drugs.   Review of Systems: Constitutional:  denies fever, chills, diaphoresis, appetite change and fatigue.  HEENT: denies photophobia, eye pain, redness, hearing loss, ear pain, congestion, sore throat, rhinorrhea, sneezing, neck pain, neck stiffness and tinnitus.  Respiratory: admits to SOB, DOE, cough, chest tightness, and wheezing.  Cardiovascular: admits to chest pain, palpitations and leg swelling.  Gastrointestinal: admits to  abdominal pain, diarrhea,  blood in stool.  Genitourinary: denies dysuria, urgency, frequency, hematuria, flank  pain and  difficulty urinating.  Musculoskeletal: denies  myalgias, back pain, joint swelling, arthralgias and gait problem.   Skin: denies pallor, rash and wound.  Neurological: denies dizziness, seizures, syncope, weakness, light-headedness, numbness and headaches.   Hematological: denies adenopathy, easy bruising, personal or family bleeding history.  Psychiatric/ Behavioral: denies suicidal ideation, mood changes, confusion, nervousness, sleep disturbance and agitation.    Physical Exam: BP 130/58  Pulse 61  Temp(Src) 98.1 F (36.7 C) (Oral)  Resp 18  Ht 5' (1.524 m)  Wt 316 lb 12.8 oz (143.7 kg)  BMI 61.87 kg/m2  SpO2 97%  General: Vital signs reviewed and noted.  Morbidly obese  Head: Normocephalic, atraumatic, sclera anicteric, mucus membranes are moist  Neck: Supple. Negative for carotid bruits. No JVD  Lungs:  Clear bilaterally to auscultation without wheezes, rales, or rhonchi. Breathing is normal.  Heart: RRR with S1 S2. No murmurs, rubs, or gallops    Abdomen:  Soft, non-tender, non-distended with normoactive bowel sounds.  She is morbidly obese  MSK: Strength and the appear normal for age.  Extremities: No clubbing or cyanosis. No edema.  Distal pedal pulses are 2+  Neurologic: Alert and oriented X 3. Moves all extremities spontaneously.  Psych: Responds to questions appropriately with a normal affect.    Lab results: Basic Metabolic Panel:  Recent Labs Lab 05/22/13 1329 05/23/13 0705  NA 142 141  K 4.1 3.9  CL 101 100  CO2 30 29  GLUCOSE 115* 86  BUN 19 17  CREATININE 0.91 0.87  CALCIUM 8.4 8.2*  MG  --  1.7    Liver Function Tests:  Recent Labs Lab 05/22/13 1329  AST 16  ALT 8  ALKPHOS 108  BILITOT 0.8  PROT 6.8  ALBUMIN 3.0*    Recent Labs Lab 05/22/13 1329  LIPASE 12   No results found for this basename: AMMONIA,  in the last 168 hours  CBC:  Recent Labs Lab 05/22/13 1329 05/23/13 0705  WBC 8.2 7.9  NEUTROABS 5.5  --   HGB  11.8* 11.1*  HCT 38.9 36.4  MCV 90.9 91.7  PLT 209 216    Cardiac Enzymes:  Recent Labs Lab 05/22/13 2326  TROPONINI <0.30    BNP: No components found with this basename: POCBNP,   CBG:  Recent Labs Lab 05/22/13 2157 05/23/13 0621  GLUCAP 82 97    Coagulation Studies: No results found for this basename: LABPROT, INR,  in the last 72 hours   Other results:  EKG:  NSR. Low voltage ( due to her obesity) no ST or T wave changes.   Imaging: Dg Chest 2 View  05/22/2013   CLINICAL DATA:  Cough and shortness of breath.  EXAM: CHEST  2 VIEW  COMPARISON:  PA and lateral chest 11/05/2007.  FINDINGS: There are small bilateral pleural effusions. Cardiomegaly and interstitial edema are identified. No pneumothorax.  IMPRESSION: Interstitial pulmonary edema with associated small bilateral pleural effusions.   Electronically Signed   By: Inge Rise M.D.   On: 05/22/2013 21:12     Last Echo  :  Left ventricle: Technically difficult study. The cavity size was normal. Wall thickness was normal. The estimated ejection fraction was 55%. Regional wall motion abnormalities cannot be excluded. Left ventricular diastolic function parameters were normal. - Right ventricle: The cavity size was normal. Systolic function was normal. - Inferior vena cava: The vessel was dilated; the respirophasic diameter changes were blunted (< 50%); findings are consistent with elevated central venous pressure  Assessment & Plan:  1. Chest pain: The patient's chest pains are very atypical. In fact, she's not clear whether these are originating from her chest or her abdomen. She does complain of some chest tightness with some radiation to her shoulder. These pains last anywhere from several minutes, to several hours. She occasionally has had them last all night  She presented with worsening symptoms.  Her cardiac enzymes are negative. Her EKG is nonacute.  Her symptoms are very difficult to  sort out.   At this point I think that her best option is to perform a 2 day lexiscan myoview study.    2. Possible sleep apnea:   the patient has   morbid obesity. She states that most of her problems occur when she's lying down. She noticed that she snores loudly and her son has told her that she occasionally quits breathing during the night.   She probably needs to have a sleep study as an outpatient and followup with a pulmonologist. I've advised her to lose weight as this we'll definitely help her sleep apnea.   3. Hypertension:  Continue current meds.  4. Desiree Hane AV block:  Her EKG is difficult to assess given her low voltage which is due to her morbid obesity. I was able to find several episodes of Wenckebach heart block on the telemetry strips.  She is asymptomatic. No further evaluation is needed at this time.   I  think that her morbid obesity is contributing to many of her problems.    Thayer Headings, Brooke Bonito., MD, [facility] 05/23/2013, 11:23 AM Office - [phone] Pager [phone]

## 2013-05-23 NOTE — Care Management Note (Signed)
    Page 1 of 1   05/25/2013     4:47:52 PM   CARE MANAGEMENT NOTE 05/25/2013  Patient:  NAZARI, TOURANGEAU   Account Number:  [id number]  Date Initiated:  05/23/2013  Documentation initiated by:  Shenandoah Yeats  Subjective/Objective Assessment:   PT ADM ON 1/26 WITH CP, FLU LIKE S/S.  PTA, PT LIVES ALONE AND IS INDEPENDENT.     Action/Plan:   WILL FOLLOW FOR DC NEEDS AS PT PROGRESSES.  WOULD RECOMMEND P.T. CONSULT TO ASSESS FOR HOME NEEDS.   Anticipated DC Date:  05/25/2013   Anticipated DC Plan:  [place]  In-house referral  Clinical Social Worker      DC Planning Services  CM consult      Choice offered to / List presented to:     DME arranged  [place]      DME agency  [place].        Status of service:  Completed, signed off Medicare Important Message given?   (If response is "NO", the following Medicare IM given date fields will be blank) Date Medicare IM given:   Date Additional Medicare IM given:    Discharge Disposition:  HOME/SELF CARE  Per UR Regulation:  Reviewed for med. necessity/level of care/duration of stay  If discussed at [place] of Stay Meetings, dates discussed:    Comments:  1/29 Emmett Arntz,RN,BSN [id number] PT [place]; REFERRAL TO [facility] FOR TRANSPORTATION NEEDS IN [place].  PT WILL NEED HOME OXYGEN AND RW FOR HOME; REFERRAL TO [place] FOR DME NEEDS.  RW AND [place] TO BE DELIVERED TO ROOM PRIOR TO DC.

## 2013-05-23 NOTE — H&P (Signed)
Date: 05/23/2013               Patient Name:  Kathleen [name] MRN: [id number]  DOB: Dec 18, 1950 Age / Sex: 63 y.o., female   PCP: Dominic Pea, DO         Medical Service: Internal Medicine Teaching Service         Attending Physician: Dr. Axel Filler, MD    First Contact: Dr. [name] Eaton, MD Pager: [phone]  Second Contact: Dr. Jerene Pitch, MD Pager: [phone]       After Hours (After 5p/  First Contact Pager: [phone]  weekends / holidays): Second Contact Pager: [phone]   Chief Complaint: Chest Pain  History of Present Illness: Kathleen [name] is a 63 y.o. woman with a pmhx of morbid obesity, HTN, DM II, IBS, HLD, previous cholecystectomy, FOBT + who presents with a cc of chest pain. The patient state that her chest pain has been present for at least 6 months. However, this chest pain has increased in intensity over the last week. The chest pain does not radiate. It is located substernal. It feels like a pressure sensation. The patient reports associated symptoms of increased SOB with exertion. Now, she cannot walk across the room without feeling profoundly SOB. She admits to a dry cough that has worsened as well. She requires multiple pillows to pro herself at night. She has noticed increased swelling on her legs and all over her body despite being compliant with her home lasix of 20 mg qd. She also admits to poor sleep due to numerous nighttime awakenings. She states that she snores loudly and will wake abruptly feeling out of breath. She reports excessive daytime somnolence.   Of note, the patient reports RUQ pain that is similar to her baseline discomfort. She reports that two weeks ago she had two episodes of black stool that has now resolved. She reports to chronic diarrhea that is stable. She reports that she has not eaten since Sat due to this abdominal pain. She denies BRBPR or melana during this time. She admits to dry heaving earlier on the day of her admission.    Of  note, the patient reports to recent ocular surgery for a retinal tear and perhaps macular edema. She is scheduled for her next visit on Jan 30th.   On ROS, patient reports to feeling a subjective fever. She denies changes in bladder habits.   Meds: No current facility-administered medications for this encounter.   Current Outpatient Prescriptions  Medication Sig Dispense Refill  . amitriptyline (ELAVIL) 50 MG tablet TAKE 0.5 TABLETS (25 MG TOTAL) BY MOUTH AT BEDTIME AS NEEDED FOR SLEEP.  15 tablet  3  . dicyclomine (BENTYL) 20 MG tablet Take 20 mg by mouth 3 (three) times daily as needed for spasms.      Marland Kitchen dicyclomine (BENTYL) 20 MG tablet TAKE 1 TABLET (20 MG TOTAL) BY MOUTH 3 (THREE) TIMES DAILY AS NEEDED.      Marland Kitchen econazole nitrate 1 % cream Apply topically daily.  85 g  0  . furosemide (LASIX) 20 MG tablet Take 1 tablet (20 mg total) by mouth daily.  90 tablet  2  . gabapentin (NEURONTIN) 300 MG capsule TAKE ONE CAPSULE BY MOUTH 3 TIMES A DAY  270 capsule  2  . insulin detemir (LEVEMIR) 100 UNIT/ML injection Inject 87 units at bedtime  30 mL  5  . losartan (COZAAR) 50 MG tablet Take 1 tablet (50 mg total) by mouth daily.  90 tablet  3  . metFORMIN (GLUCOPHAGE) 1000 MG tablet Take 1 tablet (1,000 mg total) by mouth 2 (two) times daily with a meal.  180 tablet  3  . omeprazole (PRILOSEC) 20 MG capsule Take 1 capsule (20 mg total) by mouth daily.  90 capsule  1  . prednisoLONE acetate (PRED FORTE) 1 % ophthalmic suspension Place 1 drop into the right eye 4 (four) times daily.      Marland Kitchen glucose blood (ACCU-CHEK SMARTVIEW) test strip Use to test blood glucose 3 times daily. Dx: 250.00. Insulin dependent  100 each  6    Allergies: Allergies as of 05/22/2013 - Review Complete 05/22/2013  Allergen Reaction Noted  . Doxycycline  11/13/2007   Past Medical History  Diagnosis Date  . Irritable bowel syndrome   . Fecal occult blood test positive   . Degenerative joint disease of hand   . Obesity     . Post-menopausal bleeding   . Dyslipidemia   . Diabetes mellitus   . Inadequate material resources   . Postmenopausal    Past Surgical History  Procedure Laterality Date  . Cholecystectomy    . Other surgical history      right shoulder tendon repair 05/2011   Family History  Problem Relation Age of Onset  . Diabetes Mother   . Obesity Mother   . Stroke Father   . Diabetes Sister   . Colon cancer Neg Hx    History   Social History  . Marital Status: Divorced    Spouse Name: N/A    Number of Children: 2  . Years of Education: N/A   Occupational History  . unemployed    Social History Main Topics  . Smoking status: Never Smoker   . Smokeless tobacco: Never Used  . Alcohol Use: No  . Drug Use: No  . Sexual Activity: Not on file   Other Topics Concern  . Not on file   Social History Narrative   Single.   Divorced x2.   Has 2 children, by two different fathers.   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 May apply for disability/SSI given her pain in her hands which has made interviewing for jobs difficult.   Never Smoked   Alcohol use-no   Drug use-no   Regular exercise-yes      Financial assistance approved for 100% discount at [facility] and has [facility] card per Enedina Finner   02/18/2010             Review of Systems: Pertinent items are noted in HPI.  Physical Exam: Blood pressure 133/70, pulse 63, temperature 98.4 F (36.9 C), temperature source Oral, resp. rate 22, SpO2 96.00%. Physical Exam  Constitutional: She is oriented to person, place, and time. She appears well-developed and well-nourished. No distress.  HENT:  Head: Normocephalic.  Mouth/Throat: Oropharynx is clear and moist.  Cardiovascular: Normal rate, regular rhythm and intact distal pulses.  Exam reveals no gallop and no friction rub.   No murmur heard. Distant heart sounds  Pulmonary/Chest: No respiratory distress.  She has wheezes. She has rales. She exhibits no tenderness.  Mild wheezing bil. Crackles midway up the lungs bil.   Abdominal: Soft. Bowel sounds are normal. She exhibits no distension. There is tenderness.  Mild tenderness to palpation in the RUQ  Musculoskeletal:  2+ pitting edema in bil legs.  Neurological: She is alert and oriented to person,  place, and time.  Skin: She is not diaphoretic.  Cholecystectomy scar.  Psychiatric: She has a normal mood and affect. Her behavior is normal.     Lab results: Basic Metabolic Panel:  Recent Labs  05/22/13 1329  NA 142  K 4.1  CL 101  CO2 30  GLUCOSE 115*  BUN 19  CREATININE 0.91  CALCIUM 8.4   Liver Function Tests:  Recent Labs  05/22/13 1329  AST 16  ALT 8  ALKPHOS 108  BILITOT 0.8  PROT 6.8  ALBUMIN 3.0*    Recent Labs  05/22/13 1329  LIPASE 12   CBC:  Recent Labs  05/22/13 1329  WBC 8.2  NEUTROABS 5.5  HGB 11.8*  HCT 38.9  MCV 90.9  PLT 209   Cardiac Enzymes:  Recent Labs  05/22/13 2326  TROPONINI <0.30   BNP:  Recent Labs  05/22/13 2153  PROBNP 1253.0*   CBG:  Recent Labs  05/22/13 2157  GLUCAP 82    Urinalysis:  Recent Labs  05/22/13 1826  COLORURINE AMBER*  LABSPEC 1.024  PHURINE 6.0  GLUCOSEU NEGATIVE  HGBUR NEGATIVE  BILIRUBINUR SMALL*  KETONESUR NEGATIVE  PROTEINUR 100*  UROBILINOGEN 4.0*  NITRITE NEGATIVE  LEUKOCYTESUR NEGATIVE    Imaging results:  Dg Chest 2 View  05/22/2013   CLINICAL DATA:  Cough and shortness of breath.  EXAM: CHEST  2 VIEW  COMPARISON:  PA and lateral chest 11/05/2007.  FINDINGS: There are small bilateral pleural effusions. Cardiomegaly and interstitial edema are identified. No pneumothorax.  IMPRESSION: Interstitial pulmonary edema with associated small bilateral pleural effusions.   Electronically Signed   By: Inge Rise M.D.   On: 05/22/2013 21:12    Other results: EKG: 2nd degree AV block versus slow rate atrial fibrillation. Low  v[place] QRS. T wave inversion of V1 and V2. q waves in V1-3  Assessment & Plan by Problem: Principal Problem:   Chest pain Active Problems:   DIABETES MELLITUS, TYPE II   OBESITY   Irritable bowel syndrome   Hypertension   Chronic abdominal pain  Acute Respiratory Failure The patient's acute respiratory failure is likely due to fluid overload. She was satting 90% on RA at admission and responded well to supplemental oxygen by [place] (2 L). ABG revealed compensated respiratory acidosis. Respiratory was asked to have patient off O2 for thirty minutes prior to abg. Patient desatted to 74% after 15 minutes on RA. O2 was restarted at 4 L [place] and the ABG was completed 5 minutes later. A-a gradient of 32 (expected for age is 53). The patient appears to have fluid overload given increased LE swelling, rales on PE, Pulm edema on CXR, and elevated proBNP. This etiology of this is likely multifactorial. The differential includes CHF, hypoalbuminemia, pulmonary HTN, obesity hypoventilation syndrome, nephrotic syndrome, flu and hypothyroidism (last TSH was 3.26 October 2011). Patient had echo May 2014 which showed nl EF and no diastolic dysfunction. It was significant for dilated IVC, which is consistent with elevated CVP which may occur in OHS. It is important to note that this study was technically difficulty due to patients body habitus.  - F/U TSH - Titrate O2 to maintain sat > 92% - Consider Echocardiogram in AM. May need TEE due to body habitus  - IV furosemide 40 mg  - tamiflu and f/u flu swab. - strict I/O - Consider 24 hour urine protein collection  Chest Pain The patient is unlikely to have ACS as her chest pain has been present  for the last 6 months. However, as it has increased recently, ACS remains a possibility. Currently the patient is chest pain free after receiving fentanyl.  - ASA - Trop x 3 - Oxygen - F/U morning EKG - F/U Mag as history of hypomagnesemia   Type 2 AV (Mobitx type 1) Block  versus Atrial Fibrillation Patient has Type 2 AV (Mobitx type 1) Block versus Atrial Fibrillation. - Cards referral in AM for further eval.  Abdominal Pain The patients abdominal pain appears similar to her baseline. However, her anorexia is concerning. Further, RUQ is concerning for cholecystitis, however, she is s/p cholecystectomy. Pancreatitis was considered, but lipase was 12. Of note, the patient was easily distracted from this pain during physical exam. Thus, I suspect that the patients abdominal pain is due to her known IBS.  - Advance diet as tolerated to carb mod  Insomnia The patient has insomnia with numerous nighttime awakenings and excessive daytime sleepiness. This likely represents OSA. Patient will need outpatient sleep study.  -CPAP  DM II c/b peripheral neuropathy Patient is on levemir 87 qhs at home. We gave 60 tonight and SSI moderate. Continue home gabapentin.   Hypertension Appears stable (117/59) continue home med.  - Losartan - Lasix 40 mg  Jerking movements The patient reports having jerking movements that have increased recently. This is likely due to OSA. It is possibly due to other causes and should be further evaluated as outpatient.   Recent Eye Surgery  Continue home predforte. May consider calling ophthalmologist (Dr. Zigmund Daniel) to inform him patient is inpatient as she apparently was scheduled for a procedure on the 30th.   DVT: Lovenox Diet: Carb Mod  Dispo: Disposition is deferred at this time, awaiting improvement of current medical problems. Anticipated discharge in approximately 2-3 day(s).   The patient does have a current PCP Dominic Pea, DO) and does need an [facility] hospital follow-up appointment after discharge.  The patient does not have transportation limitations that hinder transportation to clinic appointments.  Signed: Marrion Coy, MD 05/23/2013, 1:07 AM

## 2013-05-23 NOTE — ED Notes (Signed)
O2 removed; will have ABG drawn at 2:30am

## 2013-05-23 NOTE — ED Notes (Signed)
Unable to keep pt off O2 for 30 min; Pt sats dropped to 74%; pt placed on 3 l/m with sat increase only to 90-89%; pt bumped up to 4 l/m with sats increased to 93-95%. Respiratory paged for precaution.

## 2013-05-23 NOTE — ED Notes (Signed)
Respiratory at bedside.

## 2013-05-23 NOTE — Progress Notes (Signed)
Subjective: Patient overall feels much improved. She has had no more chest pain and her SOB on exertion has improved. Her leg swell has also improved. Wt 316 today, dry Wt is likely approx 299lbs. Net negative I/O 1.3L.  Objective: Vital signs in last 24 hours: Filed Vitals:   05/23/13 0230 05/23/13 0318 05/23/13 0500 05/23/13 1400  BP: 103/86 130/58  106/54  Pulse: 55 61  52  Temp:  98.1 F (36.7 C)  98.1 F (36.7 C)  TempSrc:  Oral  Oral  Resp: 17 18  18   Height:  5' (1.524 m)    Weight:  316 lb 12.8 oz (143.7 kg) 316 lb 12.8 oz (143.7 kg)   SpO2: 96% 97%  96%  on 2LPM  Weight change:   Intake/Output Summary (Last 24 hours) at 05/23/13 1441 Last data filed at 05/23/13 1411  Gross per 24 hour  Intake    480 ml  Output   1852 ml  Net  -1372 ml   Physical Exam General: alert, cooperative, and in no apparent distress; sitting upright in chair HEENT: [place]/AT, vision grossly intact, oropharynx clear and non-erythematous  Neck: supple Lungs: clear to ascultation bilaterally, normal work of respiration, no wheezes, rales, ronchi Heart: limited 2/2 body habitus; regular rate and rhythm, no murmurs, gallops, or rubs Abdomen: very obese; soft, non-tender, non-distended, normal bowel sounds; there is 1+ pitting edema to pannus Extremities: warm extremities b/l; 2+pitting edema to BLE Neurologic: alert & oriented X3, cranial nerves II-XII grossly intact, strength grossly intact, sensation intact to light touch  Lab Results: Basic Metabolic Panel:  Recent Labs Lab 05/22/13 1329 05/23/13 0705  NA 142 141  K 4.1 3.9  CL 101 100  CO2 30 29  GLUCOSE 115* 86  BUN 19 17  CREATININE 0.91 0.87  CALCIUM 8.4 8.2*  MG  --  1.7   Liver Function Tests:  Recent Labs Lab 05/22/13 1329  AST 16  ALT 8  ALKPHOS 108  BILITOT 0.8  PROT 6.8  ALBUMIN 3.0*    Recent Labs Lab 05/22/13 1329  LIPASE 12   CBC:  Recent Labs Lab 05/22/13 1329 05/23/13 0705  WBC 8.2 7.9    NEUTROABS 5.5  --   HGB 11.8* 11.1*  HCT 38.9 36.4  MCV 90.9 91.7  PLT 209 216   Cardiac Enzymes:  Recent Labs Lab 05/22/13 2326 05/23/13 1000 05/23/13 1225  TROPONINI <0.30 <0.30 <0.30   BNP:  Recent Labs Lab 05/22/13 2153  PROBNP 1253.0*   CBG:  Recent Labs Lab 05/22/13 2157 05/23/13 0621 05/23/13 1135  GLUCAP 82 97 134*   Thyroid Function Tests:  Recent Labs Lab 05/23/13 0705  TSH 3.941   Urinalysis:  Recent Labs Lab 05/22/13 1826  COLORURINE AMBER*  LABSPEC 1.024  PHURINE 6.0  GLUCOSEU NEGATIVE  HGBUR NEGATIVE  BILIRUBINUR SMALL*  KETONESUR NEGATIVE  PROTEINUR 100*  UROBILINOGEN 4.0*  NITRITE NEGATIVE  LEUKOCYTESUR NEGATIVE   Micro Results: No results found for this or any previous visit (from the past 240 hour(s)). Studies/Results: Dg Chest 2 View  05/22/2013   CLINICAL DATA:  Cough and shortness of breath.  EXAM: CHEST  2 VIEW  COMPARISON:  PA and lateral chest 11/05/2007.  FINDINGS: There are small bilateral pleural effusions. Cardiomegaly and interstitial edema are identified. No pneumothorax.  IMPRESSION: Interstitial pulmonary edema with associated small bilateral pleural effusions.   Electronically Signed   By: Inge Rise M.D.   On: 05/22/2013 21:12   Medications: I  have reviewed the patient's current medications. Scheduled Meds: . aspirin EC  81 mg Oral Daily  . enoxaparin (LOVENOX) injection  40 mg Subcutaneous Q24H  . furosemide  40 mg Intravenous Daily  . gabapentin  300 mg Oral TID  . insulin aspart  0-15 Units Subcutaneous TID WC  . insulin detemir  60 Units Subcutaneous QHS  . losartan  50 mg Oral Daily  . oseltamivir  75 mg Oral BID  . pantoprazole  40 mg Oral Daily  . prednisoLONE acetate  1 drop Right Eye QID  . [START ON 05/24/2013] regadenoson  0.4 mg Intravenous Once  . sodium chloride  3 mL Intravenous Q12H  . sodium chloride  3 mL Intravenous Q12H   Continuous Infusions:  PRN Meds:.sodium chloride,  albuterol, morphine injection, nitroGLYCERIN, ondansetron, sodium chloride Assessment/Plan:  Volume overload leading to acute respiratory failure, chest pain- likely 2/2 CHF: Patient has new oxygen requirement, elevated proBNP (1253), and is fluid overloaded on exam as well as CXR showing interstitial edema w/ small b/l pleural effusions. I spoke with cardiology (Dr. Acie Fredrickson) regarding this patient and he believes most of this patient's issues have to do with obesity. He thinks that there is likely some diastolic dysfunction causing some fluid overload (despite echo 08/2012 showing no diastolic dysfunction as this test limited 2/2 body habitus). She will require diuresis, though Dr. Acie Fredrickson does not believe patient needs further cardiac work up. She is too obese for stress testing, so this will not occur this admission. Dr. Acie Fredrickson and I both spoke with the patient regarding the need for weight loss over all else. She also likely has a component of obesity hypoventilation. Her chest pain may be 2/2 fluid overload and/or related to her obesity. The CP and SOB seem to be improving w/ diuresis. ACS has been ruled out with three negative troponins. TSH wnl. Influenza negative.   -continue diuresing w/ lasix 40mg  IV daily (two doses so far) -monitor I/O -daily weights -supplemental O2 prn to maintain O2 >92% -d/c tamiflu and droplet precaution  -Mg 1.7 -continue ASA -will get a f/u appointment with cardiology prior to discharge -will likely need to increase patient's home dose of lasix prior to discharge  Second degree AV Block, Type 1: Dr. Acie Fredrickson evaluated patient and he does not think patient needs further work up as this seems to be benign and asymptomatic.   Abdominal Pain in the setting of IBS: Abd pain improved, though still there. This appears to be her baseline as she has frequent issues with abd pain N/V/D. - carb mod diet  Insomnia, likely OSA: The patient has insomnia with numerous nighttime  awakenings and excessive daytime sleepiness. This likely represents OSA. Patient will need outpatient sleep study.  -CPAP   DM II c/b peripheral neuropathy: Patient is on levemir 87 qhs as well as metformin 1000mg  BID at home. -continue levemir 60 U qhs for now w/ SSI (moderate) -Continue home gabapentin.   Hypertension: Stable, BP 130/58. - Losartan 50mg  daily - Lasix 40 mg IV daily  DVT: Lovenox  Diet: Carb Mod  Dispo: Disposition is deferred at this time, awaiting improvement of current medical problems.  Anticipated discharge in approximately 1-2 day(s).   The patient does have a current PCP Dominic Pea, DO) and does need an [name] hospital follow-up appointment after discharge.  The patient does not have transportation limitations that hinder transportation to clinic appointments.  .Services Needed at time of discharge: Y = Yes, Blank = No PT:  OT:   RN:   Equipment:   Other:     LOS: 1 day   [name] Eaton, MD 05/23/2013, 2:41 PM

## 2013-05-24 DIAGNOSIS — J9600 Acute respiratory failure, unspecified whether with hypoxia or hypercapnia: Principal | ICD-10-CM

## 2013-05-24 DIAGNOSIS — I509 Heart failure, unspecified: Secondary | ICD-10-CM

## 2013-05-24 DIAGNOSIS — I498 Other specified cardiac arrhythmias: Secondary | ICD-10-CM

## 2013-05-24 DIAGNOSIS — K589 Irritable bowel syndrome without diarrhea: Secondary | ICD-10-CM

## 2013-05-24 DIAGNOSIS — E1149 Type 2 diabetes mellitus with other diabetic neurological complication: Secondary | ICD-10-CM

## 2013-05-24 DIAGNOSIS — E1142 Type 2 diabetes mellitus with diabetic polyneuropathy: Secondary | ICD-10-CM

## 2013-05-24 DIAGNOSIS — G4700 Insomnia, unspecified: Secondary | ICD-10-CM

## 2013-05-24 DIAGNOSIS — E8779 Other fluid overload: Secondary | ICD-10-CM

## 2013-05-24 DIAGNOSIS — I5043 Acute on chronic combined systolic (congestive) and diastolic (congestive) heart failure: Secondary | ICD-10-CM | POA: Diagnosis present

## 2013-05-24 LAB — BASIC METABOLIC PANEL
BUN: 18 mg/dL (ref 6–23)
CO2: 32 mEq/L (ref 19–32)
Calcium: 8.5 mg/dL (ref 8.4–10.5)
Chloride: 102 mEq/L (ref 96–112)
Creatinine, Ser: 1.16 mg/dL — ABNORMAL HIGH (ref 0.50–1.10)
GFR calc Af Amer: 58 mL/min — ABNORMAL LOW (ref >90)
GFR calc non Af Amer: 50 mL/min — ABNORMAL LOW (ref >90)
Glucose, Bld: 237 mg/dL — ABNORMAL HIGH (ref 70–99)
POTASSIUM: 4.4 meq/L (ref 3.7–5.3)
Sodium: 143 mEq/L (ref 137–147)

## 2013-05-24 LAB — URINE CULTURE
Colony Count: NO GROWTH
Culture: NO GROWTH

## 2013-05-24 LAB — GLUCOSE, CAPILLARY
GLUCOSE-CAPILLARY: 209 mg/dL — AB (ref 70–99)
Glucose-Capillary: 238 mg/dL — ABNORMAL HIGH (ref 70–99)
Glucose-Capillary: 213 mg/dL — ABNORMAL HIGH (ref 70–99)
Glucose-Capillary: 208 mg/dL — ABNORMAL HIGH (ref 70–99)

## 2013-05-24 MED ORDER — FUROSEMIDE 10 MG/ML IJ SOLN
20.0000 mg | Freq: Once | INTRAMUSCULAR | Status: AC
  Administered 2013-05-24: 20 mg via INTRAVENOUS
  Filled 2013-05-24 (×2): qty 2

## 2013-05-24 NOTE — Progress Notes (Signed)
Inpatient Diabetes Program Recommendations  AACE/ADA: New Consensus Statement on Inpatient Glycemic Control (2013)  Target Ranges:  Prepandial:   less than 140 mg/dL      Peak postprandial:   less than 180 mg/dL (1-2 hours)      Critically ill patients:  140 - 180 mg/dL   Reason for Visit: Results for Kathleen [name], Kathleen [name] (MRN [id number]) as of 05/24/2013 14:55  Ref. Range 05/23/2013 17:17 05/23/2013 21:04 05/24/2013 06:18 05/24/2013 11:19  Glucose-Capillary Latest Range: 70-99 mg/dL 162 (H) 202 (H) 238 (H) 213 (H)    Diabetes history: Type 2 diabetes Outpatient Diabetes medications: Levemir 87 units q HS and Metformin 1000 mg bid Current orders for Inpatient glycemic control: Levemir 60 units daily and Moderate Novolog tid with meals  May consider increasing Levemir to 75 units daily.    Thanks, Adah Perl, RN, BC-ADM Inpatient Diabetes Coordinator Pager [phone]

## 2013-05-24 NOTE — Progress Notes (Addendum)
Subjective: Patient overall feels much improved. No more chest pain, though she does have some DOE. Leg swelling has improved and she has been able to get up and walk around a few times. Wt 313lbs today, down from 316lbs yesterday. Net negative I/O 1.7L.  Objective: Vital signs in last 24 hours: Filed Vitals:   05/23/13 0500 05/23/13 1400 05/23/13 2115 05/24/13 0520  BP:  106/54 111/51 101/57  Pulse:  52 52 50  Temp:  98.1 F (36.7 C) 98.1 F (36.7 C) 97.9 F (36.6 C)  TempSrc:  Oral Oral Oral  Resp:  18 18 18   Height:      Weight: 316 lb 12.8 oz (143.7 kg)   313 lb 7.9 oz (142.2 kg)  SpO2:  96% 95% 91%  on 2LPM  Weight change: -3 lb 4.9 oz (-1.5 kg)  Intake/Output Summary (Last 24 hours) at 05/24/13 1159 Last data filed at 05/24/13 0900  Gross per 24 hour  Intake    600 ml  Output   1251 ml  Net   -651 ml   Physical Exam General: alert, cooperative, and in no apparent distress; sitting upright in chair eating breakfast HEENT: [place]/AT, vision grossly intact, oropharynx clear and non-erythematous  Neck: supple Lungs: crackles to bilateral bases, otherwise CTAB- normal work of respiration Heart: limited 2/2 body habitus; regular rate and rhythm, no murmurs, gallops, or rubs Abdomen: very obese; soft, non-tender, non-distended, normal bowel sounds; pannus still with 1+ edema Extremities: warm extremities b/l; 1-2+pitting edema to BLE Neurologic: alert & oriented X3, cranial nerves II-XII grossly intact, strength grossly intact, sensation intact to light touch  Lab Results: Basic Metabolic Panel:  Recent Labs Lab 05/23/13 0705 05/24/13 0558  NA 141 143  K 3.9 4.4  CL 100 102  CO2 29 32  GLUCOSE 86 237*  BUN 17 18  CREATININE 0.87 1.16*  CALCIUM 8.2* 8.5  MG 1.7  --    Liver Function Tests:  Recent Labs Lab 05/22/13 1329  AST 16  ALT 8  ALKPHOS 108  BILITOT 0.8  PROT 6.8  ALBUMIN 3.0*    Recent Labs Lab 05/22/13 1329  LIPASE 12   CBC:  Recent  Labs Lab 05/22/13 1329 05/23/13 0705  WBC 8.2 7.9  NEUTROABS 5.5  --   HGB 11.8* 11.1*  HCT 38.9 36.4  MCV 90.9 91.7  PLT 209 216   Cardiac Enzymes:  Recent Labs Lab 05/22/13 2326 05/23/13 1000 05/23/13 1225  TROPONINI <0.30 <0.30 <0.30   BNP:  Recent Labs Lab 05/22/13 2153  PROBNP 1253.0*   CBG:  Recent Labs Lab 05/23/13 0621 05/23/13 1135 05/23/13 1717 05/23/13 2104 05/24/13 0618 05/24/13 1119  GLUCAP 97 134* 162* 202* 238* 213*   Thyroid Function Tests:  Recent Labs Lab 05/23/13 0705  TSH 3.941   Urinalysis:  Recent Labs Lab 05/22/13 1826  COLORURINE AMBER*  LABSPEC 1.024  PHURINE 6.0  GLUCOSEU NEGATIVE  HGBUR NEGATIVE  BILIRUBINUR SMALL*  KETONESUR NEGATIVE  PROTEINUR 100*  UROBILINOGEN 4.0*  NITRITE NEGATIVE  LEUKOCYTESUR NEGATIVE   Micro Results: Recent Results (from the past 240 hour(s))  URINE CULTURE     Status: None   Collection Time    05/23/13  9:19 AM      Result Value Range Status   Specimen Description URINE, CLEAN CATCH   Final   Special Requests NONE   Final   Culture  Setup Time     Final   Value: 05/23/2013 14:08  Performed at [facility]     Final   Value: NO GROWTH     Performed at [facility]   Culture     Final   Value: NO GROWTH     Performed at [facility]   Report Status 05/24/2013 FINAL   Final  MRSA PCR SCREENING     Status: None   Collection Time    05/23/13  2:11 PM      Result Value Range Status   MRSA by PCR NEGATIVE  NEGATIVE Final   Comment:            The GeneXpert MRSA Assay (FDA     approved for NASAL specimens     only), is one component of a     comprehensive MRSA colonization     surveillance program. It is not     intended to diagnose MRSA     infection nor to guide or     monitor treatment for     MRSA infections.   Studies/Results: Dg Chest 2 View  05/22/2013   CLINICAL DATA:  Cough and shortness of breath.  EXAM: CHEST  2 VIEW   COMPARISON:  PA and lateral chest 11/05/2007.  FINDINGS: There are small bilateral pleural effusions. Cardiomegaly and interstitial edema are identified. No pneumothorax.  IMPRESSION: Interstitial pulmonary edema with associated small bilateral pleural effusions.   Electronically Signed   By: Inge Rise M.D.   On: 05/22/2013 21:12   Medications: I have reviewed the patient's current medications. Scheduled Meds: . aspirin EC  81 mg Oral Daily  . enoxaparin (LOVENOX) injection  40 mg Subcutaneous Q24H  . furosemide  40 mg Intravenous Daily  . gabapentin  300 mg Oral TID  . insulin aspart  0-15 Units Subcutaneous TID WC  . insulin detemir  60 Units Subcutaneous QHS  . losartan  50 mg Oral Daily  . pantoprazole  40 mg Oral Daily  . prednisoLONE acetate  1 drop Right Eye QID  . regadenoson  0.4 mg Intravenous Once  . sodium chloride  3 mL Intravenous Q12H   Continuous Infusions:  PRN Meds:.sodium chloride, albuterol, morphine injection, nitroGLYCERIN, ondansetron Assessment/Plan:  Volume overload leading to acute respiratory failure, chest pain- likely 2/2 CHF: Patient is feeling much improved. She is still having some DOE and is still fluid overloaded w/ crackles to bilateral bases as well as pitting edema to extremities. Will continue to diurese w/ IV lasix for one more day w/ planned discharge home tomorrow on increased dose of lasix (per cardiology, likely lasix 40mg  BID w/ close follow up for BMP check).  -continue diuresing w/ lasix 40mg  IV (thrid dose today) w/ likely another dose of lasix 20mg  IV this afternoon -monitor I/O -daily weights -will ambulate patient and check O2 saturation -supplemental O2 prn to maintain O2 >92% -continue ASA  Sinus bradycardia w/ Second degree AV Block, Type 1: Dr. Acie Fredrickson evaluated patient and he does not think patient needs further work up as this seems to be benign and asymptomatic. Patient had 11 beat run of V tach overnight. I spoke with  cardiology about this and they do not recommend further treatment changes at this time.  Abdominal Pain in the setting of IBS: Improved. No complaint of abd pain this morning.  - carb mod diet  Insomnia, likely OSA: The patient has insomnia with numerous nighttime awakenings and excessive daytime sleepiness. This likely represents OSA. Patient will need outpatient sleep  study.  -CPAP   DM II c/b peripheral neuropathy: Patient is on levemir 87 qhs as well as metformin 1000mg  BID at home. CBGs overnight 134-238. -continue levemir 60 U qhs for now w/ SSI (moderate) -Continue home gabapentin.   Hypertension: Somewhat soft today, 101-111/51-57. - Losartan 50mg  daily - Lasix 40 mg IV daily, likely give another dose of lasix this afternoon  DVT: Lovenox  Diet: Carb Mod  Dispo: Disposition is deferred at this time, awaiting improvement of current medical problems.  Anticipated discharge in approximately 1-2 day(s).   The patient does have a current PCP Dominic Pea, DO) and does need an [facility] hospital follow-up appointment after discharge.  The patient does not have transportation limitations that hinder transportation to clinic appointments.  .Services Needed at time of discharge: Y = Yes, Blank = No PT:   OT:   RN:   Equipment:   Other:     LOS: 2 days   [name] Eaton, MD 05/24/2013, 11:59 AM

## 2013-05-24 NOTE — Progress Notes (Addendum)
     SUBJECTIVE: Feels much better. No chest pain. Breathing is better.   Tele: sinus brady  BP 101/57  Pulse 50  Temp(Src) 97.9 F (36.6 C) (Oral)  Resp 18  Ht 5' (1.524 m)  Wt 313 lb 7.9 oz (142.2 kg)  BMI 61.23 kg/m2  SpO2 91%  Intake/Output Summary (Last 24 hours) at 05/24/13 0908 Last data filed at 05/23/13 2116  Gross per 24 hour  Intake    480 ml  Output   1852 ml  Net  -1372 ml    PHYSICAL EXAM General: Morbidly obese, in no acute distress. Alert and oriented x 3.  Psych:  Good affect, responds appropriately Neck: No JVD. No masses noted.  Lungs: Clear bilaterally with no wheezes or rhonci noted.  Heart: Regular, brady with no murmurs noted. Abdomen: Bowel sounds are present. Soft, non-tender.  Extremities: 1+ lower extremity edema  LABS: Basic Metabolic Panel:  Recent Labs  05/23/13 0705 05/24/13 0558  NA 141 143  K 3.9 4.4  CL 100 102  CO2 29 32  GLUCOSE 86 237*  BUN 17 18  CREATININE 0.87 1.16*  CALCIUM 8.2* 8.5  MG 1.7  --    CBC:  Recent Labs  05/22/13 1329 05/23/13 0705  WBC 8.2 7.9  NEUTROABS 5.5  --   HGB 11.8* 11.1*  HCT 38.9 36.4  MCV 90.9 91.7  PLT 209 216   Cardiac Enzymes:  Recent Labs  05/22/13 2326 05/23/13 1000 05/23/13 1225  TROPONINI <0.30 <0.30 <0.30   Current Meds: . aspirin EC  81 mg Oral Daily  . enoxaparin (LOVENOX) injection  40 mg Subcutaneous Q24H  . furosemide  40 mg Intravenous Daily  . gabapentin  300 mg Oral TID  . insulin aspart  0-15 Units Subcutaneous TID WC  . insulin detemir  60 Units Subcutaneous QHS  . losartan  50 mg Oral Daily  . pantoprazole  40 mg Oral Daily  . prednisoLONE acetate  1 drop Right Eye QID  . regadenoson  0.4 mg Intravenous Once  . sodium chloride  3 mL Intravenous Q12H  . sodium chloride  3 mL Intravenous Q12H    ASSESSMENT AND PLAN:  1. Chest pain:  Atypical pain. Now resolved after diuresis. Pt was seen yesterday by Dr. Acie Fredrickson. Her obesity is preventing stress  testing as initially recommended. Plan is place is to not pursue ischemic testing at this time. She will need f/u with Dr. Acie Fredrickson in our [facility] office after discharge.   2. Acute diastolic CHF: She has diuresed well with IV Lasix. Will be difficult to know what target weight is in this obese patient. She was 299 lbs 6 weeks ago per records so that would be a good goal over next few weeks. Overall she reports symptomatic improvement with diuresis. Could probably send home with higher dose of Lasix. She had been on 20 mg per day at home. Would probably use 40 mg po BID and have close f/u for BMET in 5-7 days.   3. Sinus brady: Type 1 second degree AV block. No syncope. Follow.   Kathleen [name]  1/28/20159:08 AM

## 2013-05-24 NOTE — Discharge Summary (Signed)
Name: Kathleen [name] MRN: [id number] DOB: [birth year] 63 y.o. PCP: Dominic Pea, DO  Date of Admission: 05/22/2013  7:08 PM Date of Discharge: 05/25/2013 Attending Physician: Dr. Marinda Elk  Discharge Diagnosis: Principal Problem:   Acute respiratory failure- secondary to CHF, perhaps component of chronic obesity hypoventilation syndrome Active Problems:   DIABETES MELLITUS, TYPE II- poor control   OBESITY   Irritable bowel syndrome   Hypertension   Chronic abdominal pain   Chest pain   Mobitz type 1 AV block   NSVT (nonsustained ventricular tachycardia)  Discharge Medications:   Medication List    STOP taking these medications       Magnesium Oxide 400 MG Caps      TAKE these medications       amitriptyline 50 MG tablet  Commonly known as:  ELAVIL  TAKE 0.5 TABLETS (25 MG TOTAL) BY MOUTH AT BEDTIME AS NEEDED FOR SLEEP.     dicyclomine 20 MG tablet  Commonly known as:  BENTYL  Take 20 mg by mouth 3 (three) times daily as needed for spasms.     econazole nitrate 1 % cream  Apply topically daily.     furosemide 40 MG tablet  Commonly known as:  LASIX  Take 1 tablet (40 mg total) by mouth 2 (two) times daily.     gabapentin 300 MG capsule  Commonly known as:  NEURONTIN  TAKE ONE CAPSULE BY MOUTH 3 TIMES A DAY     glucose blood test strip  Commonly known as:  ACCU-CHEK SMARTVIEW  Use to test blood glucose 3 times daily. Dx: 250.00. Insulin dependent     insulin detemir 100 UNIT/ML injection  Commonly known as:  LEVEMIR  Inject 87 units at bedtime     losartan 50 MG tablet  Commonly known as:  COZAAR  Take 1 tablet (50 mg total) by mouth daily.     metFORMIN 1000 MG tablet  Commonly known as:  GLUCOPHAGE  Take 1 tablet (1,000 mg total) by mouth 2 (two) times daily with a meal.     omeprazole 20 MG capsule  Commonly known as:  PRILOSEC  Take 1 capsule (20 mg total) by mouth daily.     prednisoLONE acetate 1 % ophthalmic suspension  Commonly known as:   PRED FORTE  Place 1 drop into the right eye 4 (four) times daily.        Disposition and follow-up:   [name] was discharged from [facility] in Stable condition.  At the hospital follow up visit please address:  1.  Likely OSA- will need to be set up for outpatient sleep study  2.  Likely CHF- no documented hx of CHF, though though to have some element of diastolic dysfunction (Echo study limited 2/2 body habitus). She was discharged on an increased dose of lasix from what she had been taking (lasix 20mg  daily-->lasix 40mg  BID). Please check BMP to assess Cr/K. Consider down titrating lasix if BP low.  3. Mobitz type 1 AV block- cardiology evaluated and did not recommend any further work up; she has close cardiology follow up  4. Hypoxia- Patient satting 76% on room air at rest upon discharge, sats improved with 2LPM O2 so she was sent home on continuous home oxygen. She will need pulmonary referral.  Labs / imaging needed at time of follow-up: BMP Pending labs/ test needing follow-up: none  Follow-up Appointments: Follow-up Information   Follow up with [name] Eaton, MD On 06/02/2013. (at 10:15am)  Specialty:  Internal Medicine   Contact information:   [place] [address] [phone]       Follow up with Truitt Merle, NP On 06/09/2013. (at 2:30pm)    Specialty:  Nurse Practitioner   Contact information:   [address] [place] [address] [phone]       Discharge Instructions: Discharge Orders   Future Appointments Provider Department Dept Phone   06/02/2013 10:15 AM [name] Eaton, MD [place] [phone]   06/09/2013 2:30 PM Burtis Junes, NP [address] Office [phone]   06/15/2013 10:45 AM Dominic Pea, DO [facility] [place] [phone]   06/19/2013 1:40 PM [facility] [place] [phone]   Please  wear two piece clothing and wear no powder or deodorant. Please arrive 15 minutes early prior to your appointment time.   06/26/2013 9:30 AM Kendell Bane, [place] [facility] [place]   Future Orders Complete By Expires   ([place]) Call MD:  Anytime you have any of the following symptoms: 1) 3 pound weight gain in 24 hours or 5 pounds in 1 week 2) shortness of breath, with or without a dry hacking cough 3) swelling in the hands, feet or stomach 4) if you have to sleep on extra pillows at night in order to breathe.  As directed    Call MD for:  difficulty breathing, headache or visual disturbances  As directed    Call MD for:  persistant dizziness or light-headedness  As directed    Call MD for:  temperature >100.4  As directed    Diet - low sodium heart healthy  As directed    Increase activity slowly  As directed      Consultations: Treatment Team:  Rounding Lbcardiology, MD  Procedures Performed:  Dg Chest 2 View  05/22/2013   CLINICAL DATA:  Cough and shortness of breath.  EXAM: CHEST  2 VIEW  COMPARISON:  PA and lateral chest 11/05/2007.  FINDINGS: There are small bilateral pleural effusions. Cardiomegaly and interstitial edema are identified. No pneumothorax.  IMPRESSION: Interstitial pulmonary edema with associated small bilateral pleural effusions.   Electronically Signed   By: Inge Rise M.D.   On: 05/22/2013 21:12   Admission HPI: Kathleen [name] is a 63 y.o. woman with a pmhx of morbid obesity, HTN, DM II, IBS, HLD, previous cholecystectomy, FOBT + who presents with a cc of chest pain. The patient state that her chest pain has been present for at least 6 months. However, this chest pain has increased in intensity over the last week. The chest pain does not radiate. It is located substernal. It feels like a pressure sensation. The patient reports associated symptoms of increased SOB with exertion. Now, she cannot walk across the room without feeling  profoundly SOB. She admits to a dry cough that has worsened as well. She requires multiple pillows to pro herself at night. She has noticed increased swelling on her legs and all over her body despite being compliant with her home lasix of 20 mg qd. She also admits to poor sleep due to numerous nighttime awakenings. She states that she snores loudly and will wake abruptly feeling out of breath. She reports excessive daytime somnolence.  Of note, the patient reports RUQ pain that is similar to her baseline discomfort. She reports that two weeks ago she had two episodes of black stool that has  now resolved. She reports to chronic diarrhea that is stable. She reports that she has not eaten since Sat due to this abdominal pain. She denies BRBPR or melana during this time. She admits to dry heaving earlier on the day of her admission.  Of note, the patient reports to recent ocular surgery for a retinal tear and perhaps macular edema. She is scheduled for her next visit on Jan 30th.  Hospital Course by problem list: Volume overload leading to acute respiratory failure, chest pain- likely 2/2 CHF: On admission, patient was volume overloaded with new oxygen requirement, elevated proBNP (1253). CXR showed interstitial edema w/ small b/l pleural effusions. ACS ruled out with three negative troponins. TSH wnl. Influenza negative. We diuresed patient on first night of hospitalization with lasix 40mg  IV. She diuresed well with net I/O negative approx 1L, however she remained significantly volume overloaded (BLE edema and bibasilar crackles). Gave another dose of lasix 40mg  IV 1/28 with a follow up dose later that afternoon of lasix 20mg  IV. She received another dose of lasix 40mg  IV on 1/29. Cr increased from .87 on admission to 1.16 on 1/28 w/ diuresis, potassium remained stable. At her discharge her weight was down 3 lbs since her admission and her net I/O was approx 3L. She was discharged on lasix 40mg  PO BID w/ f/u with  cardiology and scheduled check of BMP on Wednesday next week. On morning of discharge, patient no longer had crackles on her lung exam, though she had 1+pitting edema to BLE. Patient remained hypoxic at discharge (desat to 76% on room air at rest that responded well to 2LPM [place]) and was discharged on home oxygen. I spoke with cardiology (Dr. Acie Fredrickson) regarding this patient and he believes most of this patient's issues have to do with obesity. He thinks that there is likely some diastolic dysfunction causing some fluid overload (despite echo 08/2012 showing no diastolic dysfunction as this test limited 2/2 body habitus). Dr. Acie Fredrickson did not recommend further cardiac work up as patient is not a candidate for stress testing given her large body habitus. Dr. Acie Fredrickson and I both spoke with the patient regarding the need for weight loss over all else. She also likely has a component of obesity hypoventilation syndrome, which should be worked up as an outpatient.  Patient will follow up with [facility] and cardiology as outpatient.  Sinus bradycardia w/ Second degree AV Block, Type 1: Seen on EKG and persisted through HD 2 on telemetry. Dr. Acie Fredrickson evaluated patient and he does not think patient needs further work up as this seems to be benign and asymptomatic. Patient did have one run of 11 beats of V tach overnight on 1/28. I spoke with cardiology about this and they did not think any further work up was necessary at this time. Beta blocker therapy was discussed, but was not started given patient is already bradycardic.  Insomnia, likely OSA: The patient has insomnia with numerous nighttime awakenings and excessive daytime sleepiness. This likely represents OSA. She did try using CPAP during her hospital stay, though she did not like it as the mask did not fit her face well. Patient will need outpatient sleep study.   DM II c/b peripheral neuropathy: Patient is on levemir 87 qhs as well as metformin 1000mg  BID at home. Pt  managed on levemir 60 U qhs  w/ SSI (moderate)--CBGs 134-238. Continued home gabapentin.   Hypertension: Stable, BP 119/64 on discharge. Continued home Losartan 50mg  daily. She received lasix as detailed  above and was discharged on lasix 40mg  BID which is increased from her prior home dose of lasix 20mg  daily.  Discharge Vitals:   BP 119/64  Pulse 75  Temp(Src) 98.3 F (36.8 C) (Oral)  Resp 18  Ht 5' (1.524 m)  Wt 314 lb 9.5 oz (142.7 kg)  BMI 61.44 kg/m2  SpO2 95%  Discharge Labs:  Results for orders placed during the hospital encounter of 05/22/13 (from the past 24 hour(s))  BASIC METABOLIC PANEL     Status: Abnormal   Collection Time    05/25/13  3:32 AM      Result Value Range   Sodium 140  137 - 147 mEq/L   Potassium 4.0  3.7 - 5.3 mEq/L   Chloride 99  96 - 112 mEq/L   CO2 30  19 - 32 mEq/L   Glucose, Bld 164 (*) 70 - 99 mg/dL   BUN 20  6 - 23 mg/dL   Creatinine, Ser 1.16 (*) 0.50 - 1.10 mg/dL   Calcium 8.6  8.4 - 10.5 mg/dL   GFR calc non Af Amer 50 (*) >90 mL/min   GFR calc Af Amer 58 (*) >90 mL/min  GLUCOSE, CAPILLARY     Status: Abnormal   Collection Time    05/25/13  6:24 AM      Result Value Range   Glucose-Capillary 156 (*) 70 - 99 mg/dL  GLUCOSE, CAPILLARY     Status: Abnormal   Collection Time    05/25/13 11:37 AM      Result Value Range   Glucose-Capillary 181 (*) 70 - 99 mg/dL   Comment 1 Notify RN     Comment 2 Documented in Chart    GLUCOSE, CAPILLARY     Status: Abnormal   Collection Time    05/25/13  4:31 PM      Result Value Range   Glucose-Capillary 175 (*) 70 - 99 mg/dL   Comment 1 Documented in Chart     Comment 2 Notify RN      Signed: [name] Eaton, MD 05/25/2013, 9:23 PM   Time Spent on Discharge: 35 minutes Services Ordered on Discharge: none Equipment Ordered on Discharge: rolling walker, home oxygen

## 2013-05-24 NOTE — Progress Notes (Signed)
Internal Medicine Attending  Date: 05/24/2013  Patient name: Kathleen [name] Medical record number: [id number] Date of birth: 1950-06-02 Age: 63 y.o. Gender: female  I saw and evaluated the patient, and discussed her care on A.M rounds with housestaff.  I reviewed the resident's note by Dr. Mechele Claude and I agree with the resident's findings and plans as documented in her note.

## 2013-05-25 DIAGNOSIS — I472 Ventricular tachycardia: Secondary | ICD-10-CM

## 2013-05-25 DIAGNOSIS — I4729 Other ventricular tachycardia: Secondary | ICD-10-CM | POA: Diagnosis not present

## 2013-05-25 LAB — GLUCOSE, CAPILLARY
Glucose-Capillary: 156 mg/dL — ABNORMAL HIGH (ref 70–99)
Glucose-Capillary: 181 mg/dL — ABNORMAL HIGH (ref 70–99)
Glucose-Capillary: 175 mg/dL — ABNORMAL HIGH (ref 70–99)

## 2013-05-25 LAB — BASIC METABOLIC PANEL
BUN: 20 mg/dL (ref 6–23)
CHLORIDE: 99 meq/L (ref 96–112)
CO2: 30 mEq/L (ref 19–32)
CREATININE: 1.16 mg/dL — AB (ref 0.50–1.10)
Calcium: 8.6 mg/dL (ref 8.4–10.5)
GFR calc Af Amer: 58 mL/min — ABNORMAL LOW (ref >90)
GFR calc non Af Amer: 50 mL/min — ABNORMAL LOW (ref >90)
Glucose, Bld: 164 mg/dL — ABNORMAL HIGH (ref 70–99)
Potassium: 4 mEq/L (ref 3.7–5.3)
Sodium: 140 mEq/L (ref 137–147)

## 2013-05-25 MED ORDER — FUROSEMIDE 40 MG PO TABS: 40.0000 mg | ORAL_TABLET | Freq: Two times a day (BID) | ORAL | Status: DC

## 2013-05-25 NOTE — Progress Notes (Signed)
Clinical Social Work Department BRIEF PSYCHOSOCIAL ASSESSMENT 05/25/2013  Patient:  Kathleen [name], Kathleen [name]     Account Number:  [id number]     Admit date:  05/22/2013  Clinical Social Worker:  Megan Salon  Date/Time:  05/25/2013 11:47 AM  Referred by:  RN  Date Referred:  05/25/2013 Referred for  Transportation assistance   Other Referral:   Interview type:  Patient Other interview type:    PSYCHOSOCIAL DATA Living Status:  ALONE Admitted from facility:   Level of care:   Primary support name:  Rae Halsted Primary support relationship to patient:  FRIEND Degree of support available:   Okay    CURRENT CONCERNS Current Concerns  Other - See comment   Other Concerns:   Transportation    SOCIAL WORK ASSESSMENT / PLAN Per RN, patient needs assistance getting rides to her appointments. CSW reviewed chart and patient has Medicaid, CSW printed out resources for [employer] in [place]. CSW went into room and introduced self and explained reason for visit. Patient states she is needing help getting to her appointments instead of taking to the bus and going to bus stops. Patient was very pleasant to Education officer, museum. CSW explained Medicaid Transportation to patient and provided a handout to patient. Patient stated this is exactly what she was looking for.   Assessment/plan status:  Information/Referral to [employer] Other assessment/ plan:   Information/referral to community resources:   [employer]    PATIENT'S/FAMILY'S RESPONSE TO PLAN OF CARE: Patient reported that she has been looking for someone to guide her in the right direction as far as getting to her appointents without having to take a bus. Patient thanked Education officer, museum and stated she is happy that [employer] is an option.       Jeanette Caprice, MSW, [place]

## 2013-05-25 NOTE — Progress Notes (Signed)
Nutrition Brief Note  Patient identified on the Malnutrition Screening Tool (MST) Report for recent weight lost without trying.  Per readings below, patient has had a 6% weight loss since beginning of December; not significant for time frame and desirable given obesity.  Wt Readings from Last 15 Encounters:  05/25/13 314 lb 9.5 oz (142.7 kg)  04/03/13 299 lb (135.626 kg)  03/16/13 299 lb (135.626 kg)  09/29/12 291 lb 14.4 oz (132.405 kg)  09/01/12 290 lb 12.8 oz (131.906 kg)  05/12/12 284 lb 6.4 oz (129.003 kg)  04/14/12 279 lb 8 oz (126.78 kg)  03/17/12 282 lb 4.8 oz (128.05 kg)  02/18/12 284 lb 3.2 oz (128.912 kg)  01/07/12 286 lb 1.6 oz (129.774 kg)  11/16/11 271 lb (122.925 kg)  11/09/11 273 lb 4.8 oz (123.968 kg)  11/03/11 267 lb 4.8 oz (121.246 kg)  10/06/11 272 lb 8 oz (123.605 kg)  09/25/11 274 lb 11.2 oz (124.603 kg)    Body mass index is 61.44 kg/(m^2). Patient meets criteria for Obesity Class III based on current BMI.   Current diet order is Carbohydrate Modified Medium Calorie, patient is consuming approximately 100% of meals at this time. Labs and medications reviewed.   No nutrition interventions warranted at this time. If nutrition issues arise, please consult RD.   Arthur Holms, RD, LDN Pager #: [phone] After-Hours Pager #: [phone]

## 2013-05-25 NOTE — Progress Notes (Addendum)
Subjective: Patient overall feels much improved since her admission. She still has some DOE. Leg swelling has improved. Net negative I/O 3.3L since admission.  Objective: Vital signs in last 24 hours: Filed Vitals:   05/24/13 1443 05/24/13 2053 05/24/13 2334 05/25/13 0544  BP: 106/58 118/49    Pulse: 56 65 67   Temp: 98.8 F (37.1 C) 98.4 F (36.9 C)    TempSrc: Oral Oral    Resp: 18 18 18    Height:      Weight:    314 lb 9.5 oz (142.7 kg)  SpO2: 92% 94% 94%   on 2LPM  Weight change: 1 lb 1.6 oz (0.5 kg)  Intake/Output Summary (Last 24 hours) at 05/25/13 1113 Last data filed at 05/25/13 0730  Gross per 24 hour  Intake    723 ml  Output   2100 ml  Net  -1377 ml   Physical Exam General: alert, cooperative, and in no apparent distress; sitting upright in chair HEENT: [place]/AT, vision grossly intact, oropharynx clear and non-erythematous  Neck: supple Lungs: CTAB- normal work of respiration Heart: limited 2/2 body habitus; regular rate and rhythm, no murmurs, gallops, or rubs Abdomen: very obese; soft, non-tender, non-distended, normal bowel sounds; pannus still with 1+ edema Extremities: warm extremities b/l; 1+pitting edema to BLE Neurologic: alert & oriented X3, cranial nerves II-XII grossly intact, strength grossly intact, sensation intact to light touch  Lab Results: Basic Metabolic Panel:  Recent Labs Lab 05/23/13 0705 05/24/13 0558 05/25/13 0332  NA 141 143 140  K 3.9 4.4 4.0  CL 100 102 99  CO2 29 32 30  GLUCOSE 86 237* 164*  BUN 17 18 20   CREATININE 0.87 1.16* 1.16*  CALCIUM 8.2* 8.5 8.6  MG 1.7  --   --    Liver Function Tests:  Recent Labs Lab 05/22/13 1329  AST 16  ALT 8  ALKPHOS 108  BILITOT 0.8  PROT 6.8  ALBUMIN 3.0*    Recent Labs Lab 05/22/13 1329  LIPASE 12   CBC:  Recent Labs Lab 05/22/13 1329 05/23/13 0705  WBC 8.2 7.9  NEUTROABS 5.5  --   HGB 11.8* 11.1*  HCT 38.9 36.4  MCV 90.9 91.7  PLT 209 216   Cardiac  Enzymes:  Recent Labs Lab 05/22/13 2326 05/23/13 1000 05/23/13 1225  TROPONINI <0.30 <0.30 <0.30   BNP:  Recent Labs Lab 05/22/13 2153  PROBNP 1253.0*   CBG:  Recent Labs Lab 05/23/13 2104 05/24/13 0618 05/24/13 1119 05/24/13 1624 05/24/13 2052 05/25/13 0624  GLUCAP 202* 238* 213* 209* 208* 156*   Thyroid Function Tests:  Recent Labs Lab 05/23/13 0705  TSH 3.941   Urinalysis:  Recent Labs Lab 05/22/13 1826  COLORURINE AMBER*  LABSPEC 1.024  PHURINE 6.0  GLUCOSEU NEGATIVE  HGBUR NEGATIVE  BILIRUBINUR SMALL*  KETONESUR NEGATIVE  PROTEINUR 100*  UROBILINOGEN 4.0*  NITRITE NEGATIVE  LEUKOCYTESUR NEGATIVE   Micro Results: Recent Results (from the past 240 hour(s))  URINE CULTURE     Status: None   Collection Time    05/23/13  9:19 AM      Result Value Range Status   Specimen Description URINE, CLEAN CATCH   Final   Special Requests NONE   Final   Culture  Setup Time     Final   Value: 05/23/2013 14:08     Performed at [place]     Final   Value: NO GROWTH  Performed at [facility]     Final   Value: NO GROWTH     Performed at [facility]   Report Status 05/24/2013 FINAL   Final  MRSA PCR SCREENING     Status: None   Collection Time    05/23/13  2:11 PM      Result Value Range Status   MRSA by PCR NEGATIVE  NEGATIVE Final   Comment:            The GeneXpert MRSA Assay (FDA     approved for NASAL specimens     only), is one component of a     comprehensive MRSA colonization     surveillance program. It is not     intended to diagnose MRSA     infection nor to guide or     monitor treatment for     MRSA infections.   Studies/Results: No results found. Medications: I have reviewed the patient's current medications. Scheduled Meds: . aspirin EC  81 mg Oral Daily  . enoxaparin (LOVENOX) injection  40 mg Subcutaneous Q24H  . furosemide  40 mg Intravenous Daily  . gabapentin  300  mg Oral TID  . insulin aspart  0-15 Units Subcutaneous TID WC  . insulin detemir  60 Units Subcutaneous QHS  . losartan  50 mg Oral Daily  . pantoprazole  40 mg Oral Daily  . prednisoLONE acetate  1 drop Right Eye QID  . regadenoson  0.4 mg Intravenous Once  . sodium chloride  3 mL Intravenous Q12H   Continuous Infusions:  PRN Meds:.sodium chloride, albuterol, morphine injection, nitroGLYCERIN, ondansetron Assessment/Plan:  CHF w/ acute volume overload leading to acute respiratory failure: Patient is feeling much improved. She is still having some DOE w/ residual pitting edema to BLE. Net I/O negative 3.3L since admission. She has a ways to go yet for her diuresis, though this can be done as an outpatient. Patient feels ready for discharge. However, patient was desatting to 82% on room air yesterday at rest and with ambulation. Will need to reassess this today. Patient likely has an element of obesity hypoventilation syndrome at baseline and may require home oxygen. Cardiology is agreeable to discharge w/ close f/u for BMP. -continue diuresing w/ lasix 40mg  IV x 1 today -monitor I/O -daily weights -will ambulate patient and check O2 saturation -supplemental O2 prn to maintain O2 >92% -continue ASA -plan to discharge on lasix 40mg  PO BID   Sinus bradycardia w/ Second degree AV Block, Type 1: Per cardiology, no further work up needed as patient is asymptomatic. She will have close cardiology follow up as outpatient.  Abdominal Pain in the setting of IBS: Improved. No complaint of abd pain this morning.  - carb mod diet  Insomnia, likely OSA: The patient has insomnia with numerous nighttime awakenings and excessive daytime sleepiness. This likely represents OSA. Patient will need outpatient sleep study.  -CPAP   DM II c/b peripheral neuropathy: Patient is on levemir 87 qhs as well as metformin 1000mg  BID at home. CBGs overnight 156-209. -continue levemir 60 U qhs for now w/ SSI  (moderate) -Continue home gabapentin.   Hypertension: BP stable, 118/49. - Losartan 50mg  daily - Lasix 40 mg IV daily, likely give another dose of lasix this afternoon  DVT: Lovenox  Diet: Carb Mod  Dispo: Discharge most likely today  The patient does have a current PCP Dominic Pea, DO) and does need an [facility] hospital follow-up appointment  after discharge.  The patient does not have transportation limitations that hinder transportation to clinic appointments.  .Services Needed at time of discharge: Y = Yes, Blank = No PT:   OT:   RN:   Equipment:   Other:     LOS: 3 days   [name] Eaton, MD 05/25/2013, 11:13 AM

## 2013-05-25 NOTE — Progress Notes (Signed)
     SUBJECTIVE: No complaints this am.   BP 118/49  Pulse 67  Temp(Src) 98.4 F (36.9 C) (Oral)  Resp 18  Ht 5' (1.524 m)  Wt 314 lb 9.5 oz (142.7 kg)  BMI 61.44 kg/m2  SpO2 94%  Intake/Output Summary (Last 24 hours) at 05/25/13 [id number] Last data filed at 05/25/13 0503  Gross per 24 hour  Intake    723 ml  Output   2275 ml  Net  -1552 ml    PHYSICAL EXAM General: Morbidly obese, in no acute distress. Alert and oriented x 3.  Psych: Good affect, responds appropriately  Neck: No JVD. No masses noted.  Lungs: Clear bilaterally with no wheezes or rhonci noted.  Heart: Regular, brady with no murmurs noted.  Abdomen: Bowel sounds are present. Soft, non-tender.  Extremities: 1+ lower extremity edema  LABS: Basic Metabolic Panel:  Recent Labs  05/23/13 0705 05/24/13 0558 05/25/13 0332  NA 141 143 140  K 3.9 4.4 4.0  CL 100 102 99  CO2 29 32 30  GLUCOSE 86 237* 164*  BUN 17 18 20   CREATININE 0.87 1.16* 1.16*  CALCIUM 8.2* 8.5 8.6  MG 1.7  --   --    CBC:  Recent Labs  05/22/13 1329 05/23/13 0705  WBC 8.2 7.9  NEUTROABS 5.5  --   HGB 11.8* 11.1*  HCT 38.9 36.4  MCV 90.9 91.7  PLT 209 216   Cardiac Enzymes:  Recent Labs  05/22/13 2326 05/23/13 1000 05/23/13 1225  TROPONINI <0.30 <0.30 <0.30   Current Meds: . aspirin EC  81 mg Oral Daily  . enoxaparin (LOVENOX) injection  40 mg Subcutaneous Q24H  . furosemide  40 mg Intravenous Daily  . gabapentin  300 mg Oral TID  . insulin aspart  0-15 Units Subcutaneous TID WC  . insulin detemir  60 Units Subcutaneous QHS  . losartan  50 mg Oral Daily  . pantoprazole  40 mg Oral Daily  . prednisoLONE acetate  1 drop Right Eye QID  . regadenoson  0.4 mg Intravenous Once  . sodium chloride  3 mL Intravenous Q12H     ASSESSMENT AND PLAN:  1. Chest pain: Atypical pain. Now resolved after diuresis. Pt was seen in consultation by Dr. Acie Fredrickson 05/23/13. Her obesity is preventing stress testing as initially  recommended. Plan is place is to not pursue ischemic testing at this time. She will need f/u with Dr. Acie Fredrickson in our [facility] office after discharge.   2. Acute diastolic CHF: She has diuresed well with IV Lasix. (-1.5 liters last 24 hours, 3.3 liters since admission). Overall she reports symptomatic improvement with diuresis. Would discharge on Lasix 40 mg po BID. Close f/u for BMET in 5-7 days. Renal function stable this am with diuresis.   3. Sinus brady: Type 1 second degree AV block. No syncope. Follow.   4. Non-sustained VT: LV function normal by echo 2014. No additional therapy at this time. She would not tolerate a beta blocker due to her bradycardia. She is off of telemetry and unable to review last 24 hours.   5. OSA: Will need outpatient sleep study.     MCALHANY,CHRISTOPHER  1/29/20157:28 AM

## 2013-05-25 NOTE — Progress Notes (Signed)
05/25/2013 2:18 PM Nursing note Pt. Oxygen saturation checked at rest on room air resulting in 76%. 2l oxygen [place] applied and oxygen saturation raised to 93%. MD on floor and made aware as well as case manager for home o2 and RW arrangements. Melville Engen, Arville Lime

## 2013-05-25 NOTE — Discharge Instructions (Signed)
We do not know for certain that you have heart failure. We SUSPECT that you might, though her prior heart tests were not definitive. We think your increased fluid on your legs is due to heart failure.  The best treatment is to limit your salt intake and to focus on losing weight.  Please take lasix 40mg  twice per day until you follow up in the clinic with your physician.   Heart Failure Heart failure is a condition in which the heart has trouble pumping blood. This means your heart does not pump blood efficiently for your body to work well. In some cases of heart failure, fluid may back up into your lungs or you may have swelling (edema) in your lower legs. Heart failure is usually a long-term (chronic) condition. It is important for you to take good care of yourself and follow your caregiver's treatment plan. CAUSES  Some health conditions can cause heart failure. Those health conditions include:  High blood pressure (hypertension) causes the heart muscle to work harder than normal. When pressure in the blood vessels is high, the heart needs to pump (contract) with more force in order to circulate blood throughout the body. High blood pressure eventually causes the heart to become stiff and weak.  Coronary artery disease (CAD) is the buildup of cholesterol and fat (plaque) in the arteries of the heart. The blockage in the arteries deprives the heart muscle of oxygen and blood. This can cause chest pain and may lead to a heart attack. High blood pressure can also contribute to CAD.  Heart attack (myocardial infarction) occurs when 1 or more arteries in the heart become blocked. The loss of oxygen damages the muscle tissue of the heart. When this happens, part of the heart muscle dies. The injured tissue does not contract as well and weakens the heart's ability to pump blood.  Abnormal heart valves can cause heart failure when the heart valves do not open and close properly. This makes the heart  muscle pump harder to keep the blood flowing.  Heart muscle disease (cardiomyopathy or myocarditis) is damage to the heart muscle from a variety of causes. These can include drug or alcohol abuse, infections, or unknown reasons. These can increase the risk of heart failure.  Lung disease makes the heart work harder because the lungs do not work properly. This can cause a strain on the heart, leading it to fail.  Diabetes increases the risk of heart failure. High blood sugar contributes to high fat (lipid) levels in the blood. Diabetes can also cause slow damage to tiny blood vessels that carry important nutrients to the heart muscle. When the heart does not get enough oxygen and food, it can cause the heart to become weak and stiff. This leads to a heart that does not contract efficiently.  Other conditions can contribute to heart failure. These include abnormal heart rhythms, thyroid problems, and low blood counts (anemia). Certain unhealthy behaviors can increase the risk of heart failure. Those unhealthy behaviors include:  Being overweight.  Smoking or chewing tobacco.  Eating foods high in fat and cholesterol.  Abusing illicit drugs or alcohol.  Lacking physical activity. SYMPTOMS  Heart failure symptoms may vary and can be hard to detect. Symptoms may include:  Shortness of breath with activity, such as climbing stairs.  Persistent cough.  Swelling of the feet, ankles, legs, or abdomen.  Unexplained weight gain.  Difficulty breathing when lying flat (orthopnea).  Waking from sleep because of the need to  sit up and get more air.  Rapid heartbeat.  Fatigue and loss of energy.  Feeling lightheaded, dizzy, or close to fainting.  Loss of appetite.  Nausea.  Increased urination during the night (nocturia). DIAGNOSIS  A diagnosis of heart failure is based on your history, symptoms, physical examination, and diagnostic tests. Diagnostic tests for heart failure may  include:  Echocardiography.  Electrocardiography.  Chest X-ray.  Blood tests.  Exercise stress test.  Cardiac angiography.  Radionuclide scans. TREATMENT  Treatment is aimed at managing the symptoms of heart failure. Medicines, behavioral changes, or surgical intervention may be necessary to treat heart failure.  Medicines to help treat heart failure may include:  Angiotensin-converting enzyme (ACE) inhibitors. This type of medicine blocks the effects of a blood protein called angiotensin-converting enzyme. ACE inhibitors relax (dilate) the blood vessels and help lower blood pressure.  Angiotensin receptor blockers. This type of medicine blocks the actions of a blood protein called angiotensin. Angiotensin receptor blockers dilate the blood vessels and help lower blood pressure.  Water pills (diuretics). Diuretics cause the kidneys to remove salt and water from the blood. The extra fluid is removed through urination. This loss of extra fluid lowers the volume of blood the heart pumps.  Beta blockers. These prevent the heart from beating too fast and improve heart muscle strength.  Digitalis. This increases the force of the heartbeat.  Healthy behavior changes include:  Obtaining and maintaining a healthy weight.  Stopping smoking or chewing tobacco.  Eating heart healthy foods.  Limiting or avoiding alcohol.  Stopping illicit drug use.  Physical activity as directed by your caregiver.  Surgical treatment for heart failure may include:  A procedure to open blocked arteries, repair damaged heart valves, or remove damaged heart muscle tissue.  A pacemaker to improve heart muscle function and control certain abnormal heart rhythms.  An internal cardioverter defibrillator to treat certain serious abnormal heart rhythms.  A left ventricular assist device to assist the pumping ability of the heart. HOME CARE INSTRUCTIONS   Take your medicine as directed by your  caregiver. Medicines are important in reducing the workload of your heart, slowing the progression of heart failure, and improving your symptoms.  Do not stop taking your medicine unless directed by your caregiver.  Do not skip any dose of medicine.  Refill your prescriptions before you run out of medicine. Your medicines are needed every day.  Take over-the-counter medicine only as directed by your caregiver or pharmacist.  Engage in moderate physical activity if directed by your caregiver. Moderate physical activity can benefit some people. The elderly and people with severe heart failure should consult with a caregiver for physical activity recommendations.  Eat heart healthy foods. Food choices should be free of trans fat and low in saturated fat, cholesterol, and salt (sodium). Healthy choices include fresh or frozen fruits and vegetables, fish, lean meats, legumes, fat-free or low-fat dairy products, and whole grain or high fiber foods. Talk to a dietitian to learn more about heart healthy foods.  Limit sodium if directed by your caregiver. Sodium restriction may reduce symptoms of heart failure in some people. Talk to a dietitian to learn more about heart healthy seasonings.  Use healthy cooking methods. Healthy cooking methods include roasting, grilling, broiling, baking, poaching, steaming, or stir-frying. Talk to a dietitian to learn more about healthy cooking methods.  Limit fluids if directed by your caregiver. Fluid restriction may reduce symptoms of heart failure in some people.  Weigh yourself every day.  Daily weights are important in the early recognition of excess fluid. You should weigh yourself every morning after you urinate and before you eat breakfast. Wear the same amount of clothing each time you weigh yourself. Record your daily weight. Provide your caregiver with your weight record.  Monitor and record your blood pressure if directed by your caregiver.  Check your  pulse if directed by your caregiver.  Lose weight if directed by your caregiver. Weight loss may reduce symptoms of heart failure in some people.  Stop smoking or chewing tobacco. Nicotine makes your heart work harder by causing your blood vessels to constrict. Do not use nicotine gum or patches before talking to your caregiver.  Schedule and attend follow-up visits as directed by your caregiver. It is important to keep all your appointments.  Limit alcohol intake to no more than 1 drink per day for nonpregnant women and 2 drinks per day for men. Drinking more than that is harmful to your heart. Tell your caregiver if you drink alcohol several times a week. Talk with your caregiver about whether alcohol is safe for you. If your heart has already been damaged by alcohol or you have severe heart failure, drinking alcohol should be stopped completely.  Stop illicit drug use.  Stay up-to-date with immunizations. It is especially important to prevent respiratory infections through current pneumococcal and influenza immunizations.  Manage other health conditions such as hypertension, diabetes, thyroid disease, or abnormal heart rhythms as directed by your caregiver.  Learn to manage stress.  Plan rest periods when fatigued.  Learn strategies to manage high temperatures. If the weather is extremely hot:  Avoid vigorous physical activity.  Use air conditioning or fans or seek a cooler location.  Avoid caffeine and alcohol.  Wear loose-fitting, lightweight, and light-colored clothing.  Learn strategies to manage cold temperatures. If the weather is extremely cold:  Avoid vigorous physical activity.  Layer clothes.  Wear mittens or gloves, a hat, and a scarf when going outside.  Avoid alcohol.  Obtain ongoing education and support as needed.  Participate or seek rehabilitation as needed to maintain or improve independence and quality of life. SEEK MEDICAL CARE IF:   Your weight  increases by 03 lb/1.4 kg in 1 day or 05 lb/2.3 kg in a week.  You have increasing shortness of breath that is unusual for you.  You are unable to participate in your usual physical activities.  You tire easily.  You cough more than normal, especially with physical activity.  You have any or more swelling in areas such as your hands, feet, ankles, or abdomen.  You are unable to sleep because it is hard to breathe.  You feel like your heart is beating fast (palpitations).  You become dizzy or lightheaded upon standing up. SEEK IMMEDIATE MEDICAL CARE IF:   You have difficulty breathing.  There is a change in mental status such as decreased alertness or difficulty with concentration.  You have a pain or discomfort in your chest.  You have an episode of fainting (syncope). MAKE SURE YOU:   Understand these instructions.  Will watch your condition.  Will get help right away if you are not doing well or get worse. Document Released: 04/13/2005 Document Revised: 08/08/2012 Document Reviewed: 05/05/2012 [facility] Patient Information 2014 [place], [place]. Bradycardia Bradycardia is a term for a heart rate (pulse) that, in adults, is slower than 60 beats per minute. A normal rate is 60 to 100 beats per minute. A heart rate below  60 beats per minute may be normal for some adults with healthy hearts. If the rate is too slow, the heart may have trouble pumping the volume of blood the body needs. If the heart rate gets too low, blood flow to the brain may be decreased and may make you feel lightheaded, dizzy, or faint. The heart has a natural pacemaker in the top of the heart called the SA node (sinoatrial or sinus node). This pacemaker sends out regular electrical signals to the muscle of the heart, telling the heart muscle when to beat (contract). The electrical signal travels from the upper parts of the heart (atria) through the AV node (atrioventricular node), to the lower chambers of the  heart (ventricles). The ventricles squeeze, pumping the blood from your heart to your lungs and to the rest of your body. CAUSES   Problem with the heart's electrical system.  Problem with the heart's natural pacemaker.  Heart disease, damage, or infection.  Medications.  Problems with minerals and salts (electrolytes). SYMPTOMS   Fainting (syncope).  Fatigue and weakness.  Shortness of breath (dyspnea).  Chest pain (angina).  Drowsiness.  Confusion. DIAGNOSIS   An electrocardiogram (ECG) can help your caregiver determine the type of slow heart rate you have.  If the cause is not seen on an ECG, you may need to wear a heart monitor that records your heart rhythm for several hours or days.  Blood tests. TREATMENT   Electrolyte supplements.  Medications.  Withholding medication which is causing a slow heart rate.  Pacemaker placement. SEEK IMMEDIATE MEDICAL CARE IF:   You feel lightheaded or faint.  You develop an irregular heart rate.  You feel chest pain or have trouble breathing. MAKE SURE YOU:   Understand these instructions.  Will watch your condition.  Will get help right away if you are not doing well or get worse. Document Released: 01/03/2002 Document Revised: 07/06/2011 Document Reviewed: 11/30/2007 [facility] Patient Information 2014 [place].

## 2013-05-26 ENCOUNTER — Other Ambulatory Visit (INDEPENDENT_AMBULATORY_CARE_PROVIDER_SITE_OTHER): Payer: Medicare Other | Admitting: Ophthalmology

## 2013-06-02 ENCOUNTER — Encounter: Payer: Self-pay | Admitting: Internal Medicine

## 2013-06-02 ENCOUNTER — Ambulatory Visit (INDEPENDENT_AMBULATORY_CARE_PROVIDER_SITE_OTHER): Payer: Medicare Other | Admitting: Internal Medicine

## 2013-06-02 VITALS — BP 103/50 | HR 57 | Wt 286.2 lb

## 2013-06-02 DIAGNOSIS — T464X5A Adverse effect of angiotensin-converting-enzyme inhibitors, initial encounter: Secondary | ICD-10-CM

## 2013-06-02 DIAGNOSIS — R609 Edema, unspecified: Secondary | ICD-10-CM

## 2013-06-02 DIAGNOSIS — K589 Irritable bowel syndrome without diarrhea: Secondary | ICD-10-CM

## 2013-06-02 DIAGNOSIS — E119 Type 2 diabetes mellitus without complications: Secondary | ICD-10-CM

## 2013-06-02 DIAGNOSIS — I5043 Acute on chronic combined systolic (congestive) and diastolic (congestive) heart failure: Secondary | ICD-10-CM

## 2013-06-02 DIAGNOSIS — R05 Cough: Secondary | ICD-10-CM

## 2013-06-02 DIAGNOSIS — E785 Hyperlipidemia, unspecified: Secondary | ICD-10-CM

## 2013-06-02 DIAGNOSIS — R0902 Hypoxemia: Secondary | ICD-10-CM

## 2013-06-02 DIAGNOSIS — K219 Gastro-esophageal reflux disease without esophagitis: Secondary | ICD-10-CM

## 2013-06-02 DIAGNOSIS — J9600 Acute respiratory failure, unspecified whether with hypoxia or hypercapnia: Secondary | ICD-10-CM

## 2013-06-02 DIAGNOSIS — I10 Essential (primary) hypertension: Secondary | ICD-10-CM

## 2013-06-02 DIAGNOSIS — Z23 Encounter for immunization: Secondary | ICD-10-CM

## 2013-06-02 DIAGNOSIS — E669 Obesity, unspecified: Secondary | ICD-10-CM

## 2013-06-02 DIAGNOSIS — G629 Polyneuropathy, unspecified: Secondary | ICD-10-CM

## 2013-06-02 DIAGNOSIS — R0600 Dyspnea, unspecified: Secondary | ICD-10-CM

## 2013-06-02 LAB — BASIC METABOLIC PANEL WITH GFR
BUN: 18 mg/dL (ref 6–23)
CO2: 36 meq/L — AB (ref 19–32)
CREATININE: 0.93 mg/dL (ref 0.50–1.10)
Calcium: 10.1 mg/dL (ref 8.4–10.5)
Chloride: 96 mEq/L (ref 96–112)
GFR, EST NON AFRICAN AMERICAN: 67 mL/min
GFR, Est African American: 77 mL/min
Glucose, Bld: 90 mg/dL (ref 70–99)
Potassium: 4.5 mEq/L (ref 3.5–5.3)
SODIUM: 142 meq/L (ref 135–145)

## 2013-06-02 MED ORDER — LOSARTAN POTASSIUM 50 MG PO TABS: 50.0000 mg | ORAL_TABLET | Freq: Every day | ORAL | Status: DC

## 2013-06-02 MED ORDER — OMEPRAZOLE 20 MG PO CPDR: 20.0000 mg | DELAYED_RELEASE_CAPSULE | Freq: Every day | ORAL | Status: DC

## 2013-06-02 NOTE — Patient Instructions (Signed)
Thank you for your visit.  I am so glad you are feeling better. I believe the lasix (water pill) worked to get a lot of the fluid off of your legs and lungs.   I think you are a little dehydrated today, so please take only lasix 40mg  daily (NOT twice a day like you had been doing) until your follow up visit with cardiology.  Because you have history of nighttime awakenings, I ordered a sleep study for you. They will call you with this appointment time/date.  Your oxygen level is still low, especially with walking around. I referred you to pulmonology (lung doctor) for further evaluation. They will call you with further information.  I am checking your basic metabolic panel (lab work that gives [place] information about your kidneys, electrolytes) today. I will call you with any abnormal results.  Follow up with your PCP as previously scheduled.

## 2013-06-02 NOTE — Progress Notes (Signed)
Patient ID: Kathleen [name], female   DOB: 07-26-1950, 63 y.o.   MRN: [id number] HPI The patient is a 63 y.o. female with a history of HTN, CHF (though 2D echo nondiagnostic), DM type 2, HLD who presents for a hospital follow up visit.  Patient was hospitalized for volume overload (presumed diastolic CHF, though Echo limited 2/2 body habitus) from January 26 through January 29. Patient diuresed well with IV Lasix and was ultimately discharged on Lasix 40 mg by mouth twice a day, which is increased from her dose of Lasix prior to hospital admission (Lasix 20 mg daily). Patient was found to desat to 76% on room air at rest prior to her discharge. This was thought to be a combination of persistent pulmonary edema as well as presumed obesity hypoventilation syndrome. She was discharged on continuous home oxygen at 2 L per minute. She's been wearing this mostly at night, but wears it intermittently during the day. Cardiology was consulted and patient has a hospital f/u appointment with them next week.   Since her discharge, patient reports feeling much better. Her shortness of breath has resolved, she has no more episodes of PND, and she is able to sleep on one pillow at night. She has increased energy and her lower leg swelling has improved dramatically. The only complaint patient has today is that she feels as though her mouth is dry and she is more thirsty than usual. No lightheadedness.  ROS: General: no fevers, chills, changes in appetite; +increased thirst Skin: no rash HEENT: +dry mouth; no blurry vision, hearing changes, sore throat Pulm: no dyspnea, coughing, wheezing CV: no chest pain, palpitations, shortness of breath Abd: no abdominal pain, nausea/vomiting, diarrhea/constipation GU: no dysuria, hematuria, polyuria Ext: no arthralgias, myalgias Neuro: no weakness, numbness, or tingling  Filed Vitals:   06/02/13 1028  BP: 103/50  Pulse: 57  Temp 98.7 SpO2 98% on 2LPM O2; patient's SpO2 was  91% on room air at rest and 80% w/ ambulation on room air Weight 286 (Wt on 1/29 was 314lbs)  Orthostatics: Lying: BP 139/71, HR 55 Standing: BP 150/76, HR 58  Physical Exam General: obese woman, sitting upright in wheelchair, alert, cooperative, and in no apparent distress HEENT: pupils equal round and reactive to light, vision grossly intact, oropharynx clear and non-erythematous though mucous membranes and lips are dry Neck: supple, no JVD Lungs: clear to ascultation bilaterally, normal work of respiration, no wheezes, rales, ronchi Heart: regular rate and rhythm, no murmurs, gallops, or rubs Abdomen: soft, obese, non-tender, non-distended, normal bowel sounds Extremities: warm; skin is dry; trace-1+ pitting edema to BLE to mid-shin Neurologic: alert & oriented X3, cranial nerves II-XII grossly intact, strength grossly intact, sensation intact to light touch  Current Outpatient Prescriptions on File Prior to Visit  Medication Sig Dispense Refill  . amitriptyline (ELAVIL) 50 MG tablet TAKE 0.5 TABLETS (25 MG TOTAL) BY MOUTH AT BEDTIME AS NEEDED FOR SLEEP.  15 tablet  3  . dicyclomine (BENTYL) 20 MG tablet Take 20 mg by mouth 3 (three) times daily as needed for spasms.      Marland Kitchen econazole nitrate 1 % cream Apply topically daily.  85 g  0  . furosemide (LASIX) 40 MG tablet Take 1 tablet (40 mg total) by mouth 2 (two) times daily.  60 tablet  1  . gabapentin (NEURONTIN) 300 MG capsule TAKE ONE CAPSULE BY MOUTH 3 TIMES A DAY  270 capsule  2  . glucose blood (ACCU-CHEK SMARTVIEW) test strip Use to  test blood glucose 3 times daily. Dx: 250.00. Insulin dependent  100 each  6  . insulin detemir (LEVEMIR) 100 UNIT/ML injection Inject 87 units at bedtime  30 mL  5  . metFORMIN (GLUCOPHAGE) 1000 MG tablet Take 1 tablet (1,000 mg total) by mouth 2 (two) times daily with a meal.  180 tablet  3  . prednisoLONE acetate (PRED FORTE) 1 % ophthalmic suspension Place 1 drop into the right eye 4 (four) times  daily.       No current facility-administered medications on file prior to visit.    Assessment/Plan

## 2013-06-02 NOTE — Progress Notes (Signed)
SATURATION QUALIFICATIONS: (This note is used to comply with regulatory documentation for home oxygen)  Patient Saturations on Room Air at Rest = 91%  Patient Saturations on Room Air while Ambulating =80 %  Patient Saturations on 2L O2 via [place] at Rest= 98%  Patient Saturations on oxygen while Ambulating = not obtained .Despina Hidden Cassady2/6/201511:14 AM

## 2013-06-02 NOTE — Assessment & Plan Note (Signed)
Patient symptoms of shortness of breath and chest pain have resolved since she was discharged from the hospital. Her weight is down to 286 pounds from 314 pounds January 29. On my exam today, patient appears to be euvolemic or perhaps slightly dehydrated w/ dry mucous membranes. She does complain of having dry mouth and increased thirst which may represent some dehydration and overdiuresis. She is not orthostatic today in the office. Today I will check a BMP to look at electrolytes and kidney function. I also asked patient to decrease her Lasix to 40 mg by mouth daily. I instructed patient that if she becomes increasingly short of breath or notices significant leg swelling over the next week to take an extra dose of Lasix 40 mg once. Patient has followup with cardiology next Friday, February 13.

## 2013-06-02 NOTE — Assessment & Plan Note (Signed)
Discussed with patient the importance of weight loss as I believe this is large contributing factor to her symptoms. She is in good understanding and agrees to try dietary change. She also tells me she's been doing some sitting exercises with her legs recently. I think this will be a slow process, but patient seems motivated to try lifestyle changes.

## 2013-06-02 NOTE — Assessment & Plan Note (Signed)
Patient was hypoxic at her last hospital discharge on January 29 for which she received continuous home oxygen at 2 L per minute. She's continuing to use this at home and remains hypoxic today (oxygen saturation on room air is 91% at rest and 80% with ambulation). I asked patient to continue using her home oxygen. I will make a referral to pulmonology for PFTs and further evaluation of her hypoxia. It is unclear why patient remains hypoxic as her volume overload, which was thought to be contributing to her hypoxia, seems to be well controlled. It was also thought during her hospitalization the patient may have an element of obesity hypoventilation syndrome. However, I am unsure if this can explain the full extent of her hypoxia. Patient also describes having nighttime awakenings and daytime drowsiness that have decreased in frequency since she was diuresed. However, I believe that patient may have obstructive sleep apnea. I will also order an outpatient sleep study.

## 2013-06-09 ENCOUNTER — Telehealth: Payer: Self-pay | Admitting: Nurse Practitioner

## 2013-06-09 ENCOUNTER — Encounter: Payer: Self-pay | Admitting: Nurse Practitioner

## 2013-06-09 ENCOUNTER — Ambulatory Visit (INDEPENDENT_AMBULATORY_CARE_PROVIDER_SITE_OTHER): Payer: Medicare Other | Admitting: Nurse Practitioner

## 2013-06-09 VITALS — BP 140/68 | HR 62

## 2013-06-09 DIAGNOSIS — E669 Obesity, unspecified: Secondary | ICD-10-CM

## 2013-06-09 DIAGNOSIS — J9600 Acute respiratory failure, unspecified whether with hypoxia or hypercapnia: Secondary | ICD-10-CM

## 2013-06-09 NOTE — Telephone Encounter (Signed)
S/w pt I faxed in medicaid transportation verification @ [phone] pt aware

## 2013-06-09 NOTE — Telephone Encounter (Signed)
New message    Pt saw Cecille Rubin today and forgot to bring transportation medicaid form.  Do we have these forms?  She does not have a way to bring another form to our office

## 2013-06-09 NOTE — Progress Notes (Signed)
Kathleen [name] Date of Birth: March 05, 1951 Medical Record [id number]  History of Present Illness: Kathleen [name] is seen back today for a post hospital office. Seen for Dr. Acie Fredrickson. She is a 63 year old morbidly obese female with hypoventilation syndrome, poorly controlled DM, IBS, OA HTN, chronic abdominal pain who was admitted back at the end of January with acute respiratory failure/CHF - diastolic HF. She was diuresed. Not felt to be a candidate for stress testing due to her weight. Was managed conservatively. Apparently had NSVT but has normal LV function by prior echo - has resting bradycardia with type 1 second degree AV block - she is NOT a candidate for beta blockers. She was sent home on oxygen due to low sats and was to have sleep study arranged.   Comes back today. Here alone. When she stood up, she fell over to the side and onto the floor. Says her left foot had gone to sleep.  Hurt her right thumb/palm. Some swelling. No other injury noted. She has otherwise been doing ok. Her breathing has improved. She says she is not short of breath. No chest pain. No swelling. Says she is walking and actively losing weight. Tolerating her medicines. Her PCP has cut her diuretics back.   Current Outpatient Prescriptions  Medication Sig Dispense Refill  . amitriptyline (ELAVIL) 50 MG tablet TAKE 0.5 TABLETS (25 MG TOTAL) BY MOUTH AT BEDTIME AS NEEDED FOR SLEEP.  15 tablet  3  . dicyclomine (BENTYL) 20 MG tablet Take 20 mg by mouth 3 (three) times daily as needed for spasms.      . furosemide (LASIX) 40 MG tablet Take 1 tablet (40 mg total) by mouth 2 (two) times daily.  60 tablet  1  . gabapentin (NEURONTIN) 300 MG capsule TAKE ONE CAPSULE BY MOUTH 3 TIMES A DAY  270 capsule  2  . glucose blood (ACCU-CHEK SMARTVIEW) test strip Use to test blood glucose 3 times daily. Dx: 250.00. Insulin dependent  100 each  6  . insulin detemir (LEVEMIR) 100 UNIT/ML injection Inject 87 units at bedtime  30 mL  5  .  losartan (COZAAR) 50 MG tablet Take 1 tablet (50 mg total) by mouth daily.  90 tablet  3  . metFORMIN (GLUCOPHAGE) 1000 MG tablet Take 1 tablet (1,000 mg total) by mouth 2 (two) times daily with a meal.  180 tablet  3  . NON FORMULARY Place 2 L into the nose daily. O2      . omeprazole (PRILOSEC) 20 MG capsule Take 1 capsule (20 mg total) by mouth daily.  90 capsule  1   No current facility-administered medications for this visit.    Allergies  Allergen Reactions  . Doxycycline     REACTION: Wheals and pruritus    Past Medical History  Diagnosis Date  . Irritable bowel syndrome   . Fecal occult blood test positive   . Degenerative joint disease of hand   . Obesity   . Post-menopausal bleeding   . Dyslipidemia   . Diabetes mellitus   . Inadequate material resources   . Postmenopausal     Past Surgical History  Procedure Laterality Date  . Cholecystectomy    . Other surgical history      right shoulder tendon repair 05/2011    History  Smoking status  . Never Smoker   Smokeless tobacco  . Never Used    History  Alcohol Use No    Family History  Problem Relation  Age of Onset  . Diabetes Mother   . Obesity Mother   . Stroke Father   . Diabetes Sister   . Colon cancer Neg Hx     Review of Systems: The review of systems is per the HPI.  All other systems were reviewed and are negative.  Physical Exam: BP 140/68  Pulse 62  SpO2 96% She was not weighed in light of her fall - she was placed in a wheelchair.  Patient is very pleasant and in no acute distress. She is morbidly obese. Skin is warm and dry. Color is normal.  HEENT is unremarkable. Normocephalic/atraumatic. PERRL. Sclera are nonicteric. Neck is supple. No masses. No JVD. Lungs are clear. Cardiac exam shows a regular rate and rhythm. Abdomen is soft. Extremities are full but without edema. Gait not tested. No gross neurologic deficits noted. She has some mild swelling of her right thumb/palm - she has  good movement/sensation, etc. No other injury noted.   LABORATORY DATA:  Lab Results  Component Value Date   WBC 7.9 05/23/2013   HGB 11.1* 05/23/2013   HCT 36.4 05/23/2013   PLT 216 05/23/2013   GLUCOSE 90 06/02/2013   CHOL 119 09/29/2012   TRIG 96 09/29/2012   HDL 36* 09/29/2012   LDLCALC 64 09/29/2012   ALT 8 05/22/2013   AST 16 05/22/2013   NA 142 06/02/2013   K 4.5 06/02/2013   CL 96 06/02/2013   CREATININE 0.93 06/02/2013   BUN 18 06/02/2013   CO2 36* 06/02/2013   TSH 3.941 05/23/2013   INR 1.2 11/05/2007   HGBA1C 10.6 03/16/2013   MICROALBUR 0.59 11/03/2011   Echo Study Conclusions from May 2014  - Left ventricle: Technically difficult study. The cavity size was normal. Wall thickness was normal. The estimated ejection fraction was 55%. Regional wall motion abnormalities cannot be excluded. Left ventricular diastolic function parameters were normal. - Right ventricle: The cavity size was normal. Systolic function was normal. - Inferior vena cava: The vessel was dilated; the respirophasic diameter changes were blunted (< 50%); findings are consistent with elevated central venous pressure.    Assessment / Plan: 1. Acute respiratory failure/CHF - diastolic - seems much improved. Would favor continued medical management.   2. Obesity - says she is actively working on losing weight.   3. Fall - no apparent injury. We have put ice on her right hand.   We will continue with current management. See back as needed and will otherwise defer to her primary care.   Patient is agreeable to this plan and will call if any problems develop in the interim.   Burtis Junes, RN, [place] [address] [place],   [phone]

## 2013-06-09 NOTE — Patient Instructions (Signed)
Keep ice on your right thumb/palm as tolerated  Stay on your current medicine  Keep up the good work with your weight/restricting salt, etc.  Call the [place] office at [phone] if you have any questions, problems or concerns.

## 2013-06-12 ENCOUNTER — Institutional Professional Consult (permissible substitution): Payer: Medicare Other | Admitting: Pulmonary Disease

## 2013-06-15 ENCOUNTER — Ambulatory Visit: Payer: Medicare Other | Admitting: Internal Medicine

## 2013-06-19 ENCOUNTER — Ambulatory Visit
Admission: RE | Admit: 2013-06-19 | Discharge: 2013-06-19 | Disposition: A | Discharge status: Home / Self Care | Payer: Medicare Other | Source: Ambulatory Visit

## 2013-06-19 DIAGNOSIS — Z1231 Encounter for screening mammogram for malignant neoplasm of breast: Secondary | ICD-10-CM

## 2013-06-23 ENCOUNTER — Other Ambulatory Visit (INDEPENDENT_AMBULATORY_CARE_PROVIDER_SITE_OTHER): Payer: Medicare Other | Admitting: Ophthalmology

## 2013-06-26 ENCOUNTER — Other Ambulatory Visit: Payer: Self-pay | Admitting: Internal Medicine

## 2013-06-26 ENCOUNTER — Ambulatory Visit: Payer: Medicare Other | Admitting: Podiatry

## 2013-06-29 ENCOUNTER — Ambulatory Visit (INDEPENDENT_AMBULATORY_CARE_PROVIDER_SITE_OTHER): Payer: Medicare Other | Admitting: Pulmonary Disease

## 2013-06-29 ENCOUNTER — Encounter: Payer: Self-pay | Admitting: Pulmonary Disease

## 2013-06-29 VITALS — BP 124/64 | HR 56 | Ht 60.0 in | Wt 277.0 lb

## 2013-06-29 DIAGNOSIS — R0902 Hypoxemia: Secondary | ICD-10-CM

## 2013-06-29 DIAGNOSIS — J9610 Chronic respiratory failure, unspecified whether with hypoxia or hypercapnia: Secondary | ICD-10-CM

## 2013-06-29 MED ORDER — AZITHROMYCIN 250 MG PO TABS: ORAL_TABLET | ORAL | Status: DC

## 2013-06-29 NOTE — Progress Notes (Signed)
Subjective:    Patient ID: Kathleen [name], female    DOB: 1951/03/11, 63 y.o.   MRN: [id number]  HPI  Kathleen [name] was recently hospitalized at [facility] for a CHF exacerbation.  She had been told in the last that she might have CHF but her swelling increased and she needed a hospitalization.  She had shortness of breath, hypoxemia, and confusion and swelling.  She was treated with diuresis and was discharged on stronger diuretics.    She was discharged on oxygen but has only used it sporadically because it makes her feel light headed when she uses it.  She slept with it last night and has been using it with exertion. She is not using it with rest during the day.  She felt like her breathing was getting better until about a week ago when she developed shortness of breath with a sore throat, poor appetite and yellow sputum production.  She had a chill and a fever a week ago, but that has improved.  However, the cough has worsened and she has a lot of runny nose.  She hasn't been swelling more that she can tell.  Her weight is down since th hospitalization.  She has been following a heart healthy diet.    She never smoked.  She is supposed to undergo a sleep study sometime soon.   Past Medical History  Diagnosis Date  . Irritable bowel syndrome   . Fecal occult blood test positive   . Degenerative joint disease of hand   . Obesity   . Post-menopausal bleeding   . Dyslipidemia   . Diabetes mellitus   . Inadequate material resources   . Postmenopausal      Family History  Problem Relation Age of Onset  . Diabetes Mother   . Obesity Mother   . Stroke Father   . Diabetes Sister   . Colon cancer Neg Hx      History   Social History  . Marital Status: Divorced    Spouse Name: N/A    Number of Children: 2  . Years of Education: N/A   Occupational History  . unemployed    Social History Main Topics  . Smoking status: Never Smoker   . Smokeless tobacco: Never Used  . Alcohol Use: No  .  Drug Use: No  . Sexual Activity: Not Currently   Other Topics Concern  . Not on file   Social History Narrative   Single.   Divorced x2.   Has 2 children, by two different fathers.   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 May apply for disability/SSI given her pain in her hands which has made interviewing for jobs difficult.   Never Smoked   Alcohol use-no   Drug use-no   Regular exercise-yes      Financial assistance approved for 100% discount at [facility] and has [facility] card per [employer]   02/18/2010              Allergies  Allergen Reactions  . Doxycycline     REACTION: Wheals and pruritus     Outpatient Prescriptions Prior to Visit  Medication Sig Dispense Refill  . ACCU-CHEK FASTCLIX LANCETS MISC USE AS DIRECTED  102 each  1  . amitriptyline (ELAVIL) 50 MG tablet TAKE 0.5 TABLETS (25 MG TOTAL) BY MOUTH AT BEDTIME AS NEEDED FOR SLEEP.  15 tablet  3  . dicyclomine (BENTYL)  20 MG tablet Take 20 mg by mouth 3 (three) times daily as needed for spasms.      . furosemide (LASIX) 40 MG tablet Take 1 tablet (40 mg total) by mouth 2 (two) times daily.  60 tablet  1  . gabapentin (NEURONTIN) 300 MG capsule TAKE ONE CAPSULE BY MOUTH 3 TIMES A DAY  270 capsule  2  . glucose blood (ACCU-CHEK SMARTVIEW) test strip Use to test blood glucose 3 times daily. Dx: 250.00. Insulin dependent  100 each  6  . insulin detemir (LEVEMIR) 100 UNIT/ML injection Inject 87 units at bedtime  30 mL  5  . losartan (COZAAR) 50 MG tablet Take 1 tablet (50 mg total) by mouth daily.  90 tablet  3  . metFORMIN (GLUCOPHAGE) 1000 MG tablet Take 1 tablet (1,000 mg total) by mouth 2 (two) times daily with a meal.  180 tablet  3  . NON FORMULARY Place 2 L into the nose daily. Wears with exertion      . omeprazole (PRILOSEC) 20 MG capsule Take 1 capsule (20 mg total) by mouth daily.  90 capsule  1   No facility-administered medications prior  to visit.      Review of Systems  Constitutional: Negative for fever and unexpected weight change.  HENT: Positive for congestion, ear pain, postnasal drip, rhinorrhea and sinus pressure. Negative for dental problem, nosebleeds, sneezing, sore throat and trouble swallowing.   Eyes: Negative for redness and itching.  Respiratory: Positive for cough and shortness of breath. Negative for chest tightness and wheezing.   Cardiovascular: Negative for palpitations and leg swelling.  Gastrointestinal: Negative for nausea and vomiting.  Genitourinary: Negative for dysuria.  Musculoskeletal: Negative for joint swelling.  Skin: Negative for rash.  Neurological: Negative for headaches.  Hematological: Does not bruise/bleed easily.  Psychiatric/Behavioral: Negative for dysphoric mood. The patient is not nervous/anxious.        Objective:   Physical Exam Filed Vitals:   06/29/13 1143  BP: 124/64  Pulse: 56  Height: 5' (1.524 m)  Weight: 277 lb (125.646 kg)  SpO2: 94%   RA  Gen: morbidly obese, no acute distress HEENT: NCAT, PERRL, EOMi, OP clear, neck supple without masses PULM: CTA B CV: RRR, no mgr, no JVD AB: BS+, soft, nontender, no hsm Ext: warm, marked pitting edema, no clubbing, no cyanosis Derm: no rash or skin breakdown Neuro: A&Ox4, CN II-XII intact, strength 5/5 in all 4 extremities      Assessment & Plan:   Hypoxia Her main cause of hypoxemia is volume overload but certainly some degree of obesity hypoventilation syndrome is contributing.  She had an elevated CO2 in the hospital lately on an ABG which was consistent with chronic hypercapnea.    The best route to help with her hypoxemia is weight loss and strict adherence to her diuretic and fluid/sodium restriction regimen.  She has made headway as her weight is down by about 17 pounds in the last few weeks.  Her hypoxemia has also improved with this.  Today in the office her resting hypoxemia was normal but she still  needs O2 with exertion (and sleep).  Plan: -continue O2 with exertion and QHS -continue weight loss -continue diuretics, adherence to sodium and fluid restriction  Chronic respiratory failure Her ABG in January demonstrated resting hypercapnea consistent with obesity hypoventilation syndrome.  Plan: -check full PFT -make sure PSG is split night, will likely need BIPAP   Updated Medication List Outpatient Encounter Prescriptions as of  06/29/2013  Medication Sig  . ACCU-CHEK FASTCLIX LANCETS MISC USE AS DIRECTED  . amitriptyline (ELAVIL) 50 MG tablet TAKE 0.5 TABLETS (25 MG TOTAL) BY MOUTH AT BEDTIME AS NEEDED FOR SLEEP.  Marland Kitchen dicyclomine (BENTYL) 20 MG tablet Take 20 mg by mouth 3 (three) times daily as needed for spasms.  . furosemide (LASIX) 40 MG tablet Take 1 tablet (40 mg total) by mouth 2 (two) times daily.  Marland Kitchen gabapentin (NEURONTIN) 300 MG capsule TAKE ONE CAPSULE BY MOUTH 3 TIMES A DAY  . glucose blood (ACCU-CHEK SMARTVIEW) test strip Use to test blood glucose 3 times daily. Dx: 250.00. Insulin dependent  . insulin detemir (LEVEMIR) 100 UNIT/ML injection Inject 87 units at bedtime  . losartan (COZAAR) 50 MG tablet Take 1 tablet (50 mg total) by mouth daily.  . metFORMIN (GLUCOPHAGE) 1000 MG tablet Take 1 tablet (1,000 mg total) by mouth 2 (two) times daily with a meal.  . NON FORMULARY Place 2 L into the nose daily. Wears with exertion  . omeprazole (PRILOSEC) 20 MG capsule Take 1 capsule (20 mg total) by mouth daily.  Marland Kitchen azithromycin (ZITHROMAX) 250 MG tablet Take 500mg  for the first day, then 250mg  daily for 4 days

## 2013-06-29 NOTE — Patient Instructions (Signed)
Go for the lung function testing Go for the sleep study Keep using your oxygen as you are doing Take the azithromycin as written We will see you back in 2-3 months or sooner if needed

## 2013-06-30 DIAGNOSIS — J9610 Chronic respiratory failure, unspecified whether with hypoxia or hypercapnia: Secondary | ICD-10-CM | POA: Insufficient documentation

## 2013-06-30 NOTE — Assessment & Plan Note (Signed)
Her ABG in January demonstrated resting hypercapnea consistent with obesity hypoventilation syndrome.  Plan: -check full PFT -make sure PSG is split night, will likely need BIPAP

## 2013-06-30 NOTE — Assessment & Plan Note (Signed)
Her main cause of hypoxemia is volume overload but certainly some degree of obesity hypoventilation syndrome is contributing.  She had an elevated CO2 in the hospital lately on an ABG which was consistent with chronic hypercapnea.    The best route to help with her hypoxemia is weight loss and strict adherence to her diuretic and fluid/sodium restriction regimen.  She has made headway as her weight is down by about 17 pounds in the last few weeks.  Her hypoxemia has also improved with this.  Today in the office her resting hypoxemia was normal but she still needs O2 with exertion (and sleep).  Plan: -continue O2 with exertion and QHS -continue weight loss -continue diuretics, adherence to sodium and fluid restriction

## 2013-07-02 ENCOUNTER — Encounter (HOSPITAL_BASED_OUTPATIENT_CLINIC_OR_DEPARTMENT_OTHER): Payer: Medicare Other

## 2013-07-06 ENCOUNTER — Other Ambulatory Visit (INDEPENDENT_AMBULATORY_CARE_PROVIDER_SITE_OTHER): Payer: Medicare Other | Admitting: Ophthalmology

## 2013-07-06 DIAGNOSIS — H3581 Retinal edema: Secondary | ICD-10-CM

## 2013-07-06 DIAGNOSIS — E1139 Type 2 diabetes mellitus with other diabetic ophthalmic complication: Secondary | ICD-10-CM

## 2013-07-06 DIAGNOSIS — E1165 Type 2 diabetes mellitus with hyperglycemia: Secondary | ICD-10-CM

## 2013-07-12 ENCOUNTER — Ambulatory Visit (INDEPENDENT_AMBULATORY_CARE_PROVIDER_SITE_OTHER): Payer: Medicare Other | Admitting: Podiatry

## 2013-07-12 ENCOUNTER — Encounter: Payer: Self-pay | Admitting: Podiatry

## 2013-07-12 VITALS — BP 153/78 | HR 88 | Resp 16

## 2013-07-12 DIAGNOSIS — M79609 Pain in unspecified limb: Secondary | ICD-10-CM

## 2013-07-12 DIAGNOSIS — B351 Tinea unguium: Secondary | ICD-10-CM

## 2013-07-12 NOTE — Patient Instructions (Signed)
Diabetes and Foot Care Diabetes may cause you to have problems because of poor blood supply (circulation) to your feet and legs. This may cause the skin on your feet to become thinner, break easier, and heal more slowly. Your skin may become dry, and the skin may peel and crack. You may also have nerve damage in your legs and feet causing decreased feeling in them. You may not notice minor injuries to your feet that could lead to infections or more serious problems. Taking care of your feet is one of the most important things you can do for yourself.  HOME CARE INSTRUCTIONS  Wear shoes at all times, even in the house. Do not go barefoot. Bare feet are easily injured.  Check your feet daily for blisters, cuts, and redness. If you cannot see the bottom of your feet, use a mirror or ask someone for help.  Wash your feet with warm water (do not use hot water) and mild soap. Then pat your feet and the areas between your toes until they are completely dry. Do not soak your feet as this can dry your skin.  Apply a moisturizing lotion or petroleum jelly (that does not contain alcohol and is unscented) to the skin on your feet and to dry, brittle toenails. Do not apply lotion between your toes.  Trim your toenails straight across. Do not dig under them or around the cuticle. File the edges of your nails with an emery board or nail file.  Do not cut corns or calluses or try to remove them with medicine.  Wear clean socks or stockings every day. Make sure they are not too tight. Do not wear knee-high stockings since they may decrease blood flow to your legs.  Wear shoes that fit properly and have enough cushioning. To break in new shoes, wear them for just a few hours a day. This prevents you from injuring your feet. Always look in your shoes before you put them on to be sure there are no objects inside.  Do not cross your legs. This may decrease the blood flow to your feet.  If you find a minor scrape,  cut, or break in the skin on your feet, keep it and the skin around it clean and dry. These areas may be cleansed with mild soap and water. Do not cleanse the area with peroxide, alcohol, or iodine.  When you remove an adhesive bandage, be sure not to damage the skin around it.  If you have a wound, look at it several times a day to make sure it is healing.  Do not use heating pads or hot water bottles. They may burn your skin. If you have lost feeling in your feet or legs, you may not know it is happening until it is too late.  Make sure your health care provider performs a complete foot exam at least annually or more often if you have foot problems. Report any cuts, sores, or bruises to your health care provider immediately. SEEK MEDICAL CARE IF:   You have an injury that is not healing.  You have cuts or breaks in the skin.  You have an ingrown nail.  You notice redness on your legs or feet.  You feel burning or tingling in your legs or feet.  You have pain or cramps in your legs and feet.  Your legs or feet are numb.  Your feet always feel cold. SEEK IMMEDIATE MEDICAL CARE IF:   There is increasing redness,   swelling, or pain in or around a wound.  There is a red line that goes up your leg.  Pus is coming from a wound.  You develop a fever or as directed by your health care provider.  You notice a bad smell coming from an ulcer or wound. Document Released: 04/10/2000 Document Revised: 12/14/2012 Document Reviewed: 09/20/2012 [employer] Patient Information 2014 [employer].  

## 2013-07-13 NOTE — Progress Notes (Signed)
Patient ID: Kathleen [name], female   DOB: 09/04/1950, 63 y.o.   MRN: [id number]  Subjective: This patient presents for ongoing debridement of symptomatic onychomycoses.  Objective: Discolored, hypertrophic, incurvated toenails x10.  Assessment: Symptomatic onychomycoses x10  Plan: Nails x10 are debrided without any bleeding. Reappoint at three-month intervals.

## 2013-07-28 ENCOUNTER — Ambulatory Visit (HOSPITAL_BASED_OUTPATIENT_CLINIC_OR_DEPARTMENT_OTHER): Payer: Medicare Other | Attending: Internal Medicine

## 2013-07-28 VITALS — Ht 60.0 in | Wt 285.0 lb

## 2013-07-28 DIAGNOSIS — G4733 Obstructive sleep apnea (adult) (pediatric): Secondary | ICD-10-CM

## 2013-07-30 ENCOUNTER — Encounter (HOSPITAL_BASED_OUTPATIENT_CLINIC_OR_DEPARTMENT_OTHER): Payer: Medicare Other

## 2013-08-04 ENCOUNTER — Encounter: Payer: Self-pay | Admitting: *Deleted

## 2013-08-05 DIAGNOSIS — G4730 Sleep apnea, unspecified: Secondary | ICD-10-CM

## 2013-08-05 DIAGNOSIS — G4710 Hypersomnia, unspecified: Secondary | ICD-10-CM

## 2013-08-05 DIAGNOSIS — G4733 Obstructive sleep apnea (adult) (pediatric): Secondary | ICD-10-CM

## 2013-08-05 NOTE — Sleep Study (Signed)
   NAME: Kathleen [name] DATE OF BIRTH:  11-16-1950 MEDICAL RECORD NUMBER [id number]  LOCATION: [facility]  PHYSICIAN: Clinton D Young  DATE OF STUDY: 07/28/2013  SLEEP STUDY TYPE: Nocturnal Polysomnogram               REFERRING PHYSICIAN: [name] Eaton, MD  INDICATION FOR STUDY: Hypersomnia with sleep apnea  EPWORTH SLEEPINESS SCORE:   11/24 HEIGHT: 5' (152.4 cm)  WEIGHT: 285 lb (129.275 kg)    Body mass index is 55.66 kg/(m^2).  NECK SIZE: 16 in.  MEDICATIONS: Charted in for review  SLEEP ARCHITECTURE: Total sleep time 347 minutes with sleep efficiency 94.7%. Stage I was 2.3%, stage II 70.9%, stage III absent, REM 26.8% of total sleep time. Sleep latency 9.5 minutes, REM latency 104.5 minutes, awake after sleep onset 10 minutes, arousal index 1.0. Bedtime medication: None  RESPIRATORY DATA: Apnea hypopnea index (AHI) 1.4 per hour. 8 total events scored including 2 mixed apneas and 6 hypopneas. Most events were while supine. REM AHI 2.6 per hour. There were not enough events to permit application of split protocol CPAP titration.  OXYGEN DATA: Moderately loud snoring with oxygen desaturation to a nadir of 81% and mean oxygen saturation through the study of 92.6% on room air.  CARDIAC DATA: Sinus rhythm with PACs and PVCs,  bradycardia with mean heart rate 53 per minute  MOVEMENT/PARASOMNIA: No significant movement disturbance, no bathroom trips  IMPRESSION/ RECOMMENDATION:   1) Occasional respiratory event with sleep disturbance, within normal limits. AHI 1.4 per hour(the normal range for adults is an AHI from 0-5 events per hour). Moderately loud snoring with oxygen desaturation to a nadir of 81% and mean oxygen saturation through the study of 92.6% on room air.   [place], [employer] adviser of Sleep Medicine  ELECTRONICALLY SIGNED ON:  08/05/2013, 10:54 AM [place] PH: [phone]   FX: [phone]  [phone] [place]

## 2013-08-19 ENCOUNTER — Other Ambulatory Visit (HOSPITAL_COMMUNITY): Payer: Self-pay | Admitting: Internal Medicine

## 2013-09-07 ENCOUNTER — Ambulatory Visit: Payer: Medicare Other | Admitting: Internal Medicine

## 2013-09-21 ENCOUNTER — Ambulatory Visit (INDEPENDENT_AMBULATORY_CARE_PROVIDER_SITE_OTHER): Payer: Medicare Other | Admitting: Pulmonary Disease

## 2013-09-21 ENCOUNTER — Encounter: Payer: Self-pay | Admitting: Pulmonary Disease

## 2013-09-21 VITALS — BP 122/68 | HR 60 | Ht 60.0 in | Wt 267.0 lb

## 2013-09-21 DIAGNOSIS — J9610 Chronic respiratory failure, unspecified whether with hypoxia or hypercapnia: Secondary | ICD-10-CM

## 2013-09-21 NOTE — Patient Instructions (Signed)
We will arrange a pulmonary function test for you Use your oxygen at night and with exercise Keep exercising and losing weight If the pulmonary function test is abnormal we will see you back, if not then we will see you only as needed

## 2013-09-21 NOTE — Assessment & Plan Note (Signed)
This is been a stable interval for [facility]. There is little evidence for lung disease but she has not yet had her pulmonary function test.  Plan: -Pulmonary function test re-ordered -We will call her with those results -If there is nothing other than restrictive lung disease secondary to obesity then she does not need to followup with [place] -Continue oxygen at night and with exercise  -continue diet and weight loss as she is doing

## 2013-09-21 NOTE — Progress Notes (Signed)
Subjective:    Patient ID: Kathleen [name], female    DOB: 1951/02/13, 63 y.o.   MRN: [id number]  Synopsis: The floor as was referred to the [facility] in 2013 for evaluation of hypoxemic respiratory failure. There is no clear evidence of lung disease on pulmonary exam and on chest imaging. She does appear to have diastolic heart failure. Her symptoms have improved dramatically with weight loss.  HPI  Sep 22 2011 routine office visit> Yireh has been doing well since the last visit. She's continued to lose weight and has lost a total of 30 pounds since her hospitalization. She continues to exercise daily while using oxygen. She walks on a treadmill regularly. She also sleeps with oxygen at night. Her sleep study showed that she did not have obstructive sleep apnea but she does have nocturnal hypoxemia. She has been dieting by trying to be more vegetables on regular basis and this seems to be helping. Her leg swelling is improved. She really doesn't have much shortness of breath when she exercises.  Past Medical History  Diagnosis Date  . Irritable bowel syndrome   . Fecal occult blood test positive   . Degenerative joint disease of hand   . Obesity   . Post-menopausal bleeding   . Dyslipidemia   . Diabetes mellitus   . Inadequate material resources   . Postmenopausal      Review of Systems     Objective:   Physical Exam Filed Vitals:   09/21/13 1025  BP: 122/68  Pulse: 60  Height: 5' (1.524 m)  Weight: 267 lb (121.11 kg)  SpO2: 95%  RA  Gen: well appearing, no acute distress HEENT: NCAT, EOMi, OP clear PULM: CTA B CV: RRR, no mgr, no JVD AB: BS+, soft, nontender, no hsm Ext: warm, no edema, no clubbing, no cyanosis      Assessment & Plan:   Chronic respiratory failure This is been a stable interval for [place]. There is little evidence for lung disease but she has not yet had her pulmonary function test.  Plan: -Pulmonary function test re-ordered -We will call  her with those results -If there is nothing other than restrictive lung disease secondary to obesity then she does not need to followup with [place] -Continue oxygen at night and with exercise  -continue diet and weight loss as she is doing    Updated Medication List Outpatient Encounter Prescriptions as of 09/21/2013  Medication Sig  . ACCU-CHEK FASTCLIX LANCETS MISC USE AS DIRECTED  . amitriptyline (ELAVIL) 50 MG tablet TAKE 0.5 TABLETS (25 MG TOTAL) BY MOUTH AT BEDTIME AS NEEDED FOR SLEEP.  Marland Kitchen dicyclomine (BENTYL) 20 MG tablet Take 20 mg by mouth 3 (three) times daily as needed for spasms.  . furosemide (LASIX) 40 MG tablet TAKE 1 TABLET (40 MG TOTAL) daily  . gabapentin (NEURONTIN) 300 MG capsule TAKE ONE CAPSULE BY MOUTH 3 TIMES A DAY  . glucose blood (ACCU-CHEK SMARTVIEW) test strip Use to test blood glucose 3 times daily. Dx: 250.00. Insulin dependent  . insulin detemir (LEVEMIR) 100 UNIT/ML injection Inject 87 units at bedtime  . losartan (COZAAR) 50 MG tablet Take 1 tablet (50 mg total) by mouth daily.  . metFORMIN (GLUCOPHAGE) 1000 MG tablet Take 1 tablet (1,000 mg total) by mouth 2 (two) times daily with a meal.  . NON FORMULARY Place 2 L into the nose daily. Wears with exertion and qhs  . omeprazole (PRILOSEC) 20 MG capsule Take 1 capsule (20 mg total)  by mouth daily.  . [DISCONTINUED] furosemide (LASIX) 40 MG tablet TAKE 1 TABLET (40 MG TOTAL) BY MOUTH 2 (TWO) TIMES DAILY.  . [DISCONTINUED] azithromycin (ZITHROMAX) 250 MG tablet Take 500mg  for the first day, then 250mg  daily for 4 days

## 2013-09-23 ENCOUNTER — Other Ambulatory Visit: Payer: Self-pay | Admitting: Internal Medicine

## 2013-10-11 ENCOUNTER — Ambulatory Visit: Payer: Medicare Other | Admitting: Podiatry

## 2013-10-18 ENCOUNTER — Ambulatory Visit (INDEPENDENT_AMBULATORY_CARE_PROVIDER_SITE_OTHER): Payer: Commercial Managed Care - HMO | Admitting: Pulmonary Disease

## 2013-10-18 DIAGNOSIS — J9610 Chronic respiratory failure, unspecified whether with hypoxia or hypercapnia: Secondary | ICD-10-CM

## 2013-10-18 LAB — PULMONARY FUNCTION TEST
DL/VA % PRED: 114 %
DL/VA: 4.68 ml/min/mmHg/L
DLCO unc % pred: 83 %
DLCO unc: 14.56 ml/min/mmHg
FEF 25-75 PRE: 1.92 L/s
FEF 25-75 Post: 2.38 L/sec
FEF2575-%Change-Post: 23 %
FEF2575-%Pred-Post: 119 %
FEF2575-%Pred-Pre: 96 %
FEV1-%Change-Post: 4 %
FEV1-%PRED-POST: 78 %
FEV1-%Pred-Pre: 74 %
FEV1-POST: 1.6 L
FEV1-Pre: 1.53 L
FEV1FVC-%Change-Post: 5 %
FEV1FVC-%Pred-Pre: 108 %
FEV6-%Change-Post: 0 %
FEV6-%PRED-PRE: 69 %
FEV6-%Pred-Post: 70 %
FEV6-POST: 1.8 L
FEV6-PRE: 1.79 L
FEV6FVC-%PRED-POST: 104 %
FEV6FVC-%Pred-Pre: 104 %
FVC-%CHANGE-POST: 0 %
FVC-%PRED-PRE: 68 %
FVC-%Pred-Post: 67 %
FVC-PRE: 1.81 L
FVC-Post: 1.8 L
PRE FEV6/FVC RATIO: 100 %
Post FEV1/FVC ratio: 89 %
Post FEV6/FVC ratio: 100 %
Pre FEV1/FVC ratio: 84 %

## 2013-10-18 NOTE — Progress Notes (Signed)
PFT done today. 

## 2013-10-23 ENCOUNTER — Ambulatory Visit (INDEPENDENT_AMBULATORY_CARE_PROVIDER_SITE_OTHER): Payer: Commercial Managed Care - HMO | Admitting: Internal Medicine

## 2013-10-23 ENCOUNTER — Encounter: Payer: Self-pay | Admitting: Internal Medicine

## 2013-10-23 VITALS — BP 132/64 | HR 60 | Temp 97.0°F | Ht 59.0 in | Wt 257.1 lb

## 2013-10-23 DIAGNOSIS — R609 Edema, unspecified: Secondary | ICD-10-CM | POA: Insufficient documentation

## 2013-10-23 DIAGNOSIS — E119 Type 2 diabetes mellitus without complications: Secondary | ICD-10-CM

## 2013-10-23 DIAGNOSIS — I10 Essential (primary) hypertension: Secondary | ICD-10-CM

## 2013-10-23 LAB — POCT GLYCOSYLATED HEMOGLOBIN (HGB A1C): HEMOGLOBIN A1C: 6.9

## 2013-10-23 LAB — GLUCOSE, CAPILLARY: Glucose-Capillary: 92 mg/dL (ref 70–99)

## 2013-10-23 MED ORDER — ACCU-CHEK FASTCLIX LANCETS MISC: 1.0000 | Freq: Three times a day (TID) | Status: DC

## 2013-10-23 MED ORDER — INSULIN DETEMIR 100 UNIT/ML ~~LOC~~ SOLN: SUBCUTANEOUS | Status: DC

## 2013-10-23 MED ORDER — FUROSEMIDE 40 MG PO TABS: 40.0000 mg | ORAL_TABLET | Freq: Two times a day (BID) | ORAL | Status: DC

## 2013-10-23 NOTE — Progress Notes (Signed)
Subjective:    Patient ID: Kathleen [name], female    DOB: 12-Mar-1951, 63 y.o.   MRN: [id number]  HPI Kathleen [name] is a 63 yo woman pmh as listed below here for medication check.   The patient states she has changed insurance and is worried about getting her medications. She has recently run out of her test strips and that is the patient's priority today.   DM - Patient was checking blood sugars 3 times daily, before breakfast lunch and dinner before she ran out about 1 month ago. Her highest reading was 213 and lowest 72 with average of 123. Currently taking 87 units of detemir and metformin 1000mg  BID. No hypoglycemic episodes since last visit. denies polyuria, polydipsia, nausea, vomiting, diarrhea.  does request refills today.  The patient is being evaluated by pulmonology for respiratory failure 2/2 unknown etiology. Pt is set to perform PFTs to help as nothing was visibly seen on imaging but ABG resting hypercapnea. She just recently completed the studies and will follow up with pulmonology.  Past Medical History  Diagnosis Date  . Irritable bowel syndrome   . Fecal occult blood test positive   . Degenerative joint disease of hand   . Obesity   . Post-menopausal bleeding   . Dyslipidemia   . Diabetes mellitus   . Inadequate material resources   . Postmenopausal    Current Outpatient Prescriptions on File Prior to Visit  Medication Sig Dispense Refill  . ACCU-CHEK FASTCLIX LANCETS MISC USE AS DIRECTED  102 each  1  . amitriptyline (ELAVIL) 50 MG tablet TAKE 1/2 TABLET BY MOUTH AT BEDTIME AS NEEDED FOR SLEEP  15 tablet  1  . dicyclomine (BENTYL) 20 MG tablet Take 20 mg by mouth 3 (three) times daily as needed for spasms.      . furosemide (LASIX) 40 MG tablet TAKE 1 TABLET (40 MG TOTAL) daily      . gabapentin (NEURONTIN) 300 MG capsule TAKE ONE CAPSULE BY MOUTH 3 TIMES A DAY  270 capsule  2  . glucose blood (ACCU-CHEK SMARTVIEW) test strip Use to test blood glucose 3 times daily.  Dx: 250.00. Insulin dependent  100 each  6  . insulin detemir (LEVEMIR) 100 UNIT/ML injection Inject 87 units at bedtime  30 mL  5  . losartan (COZAAR) 50 MG tablet Take 1 tablet (50 mg total) by mouth daily.  90 tablet  3  . metFORMIN (GLUCOPHAGE) 1000 MG tablet Take 1 tablet (1,000 mg total) by mouth 2 (two) times daily with a meal.  180 tablet  3  . NON FORMULARY Place 2 L into the nose daily. Wears with exertion and qhs      . omeprazole (PRILOSEC) 20 MG capsule Take 1 capsule (20 mg total) by mouth daily.  90 capsule  1   No current facility-administered medications on file prior to visit.   Social, surgical, family history reviewed with patient and updated in appropriate chart locations.   Review of Systems Review of Systems - Negative except as listed in HPI    Objective:   Physical Exam Filed Vitals:   10/23/13 1441  BP: 132/64  Pulse: 60  Temp: 97 F (36.1 C)   General: sitting in chair, NAD  HEENT: PERRL, EOMI, no scleral icterus Cardiac: RRR, no rubs, murmurs or gallops Pulm: clear to auscultation bilaterally, moving normal volumes of air Abd: soft, nontender, nondistended, BS present Ext: warm and well perfused, no pedal edema Neuro: alert and oriented  X3, cranial nerves II-XII grossly intact    Assessment & Plan:  Please see problem oriented charting  Pt discussed with Dr. Dareen Piano

## 2013-10-23 NOTE — Assessment & Plan Note (Signed)
BP Readings from Last 3 Encounters:  10/23/13 132/64  09/21/13 122/68  07/12/13 153/78    Lab Results  Component Value Date   NA 142 06/02/2013   K 4.5 06/02/2013   CREATININE 0.93 06/02/2013    Assessment: Blood pressure control:   Progress toward BP goal:    Comments: well controlled and pt is working on losing weight   Plan: Medications:  continue current medications of lasix 40mg  BID, losartan 50mg  qd  Educational resources provided: brochure Self management tools provided:   Other plans: pt up to date on labs

## 2013-10-23 NOTE — Assessment & Plan Note (Signed)
Lab Results  Component Value Date   HGBA1C 10.6 03/16/2013   HGBA1C 9.5 09/01/2012   HGBA1C 8.8 03/17/2012     Assessment: Diabetes control:   Progress toward A1C goal:    Comments:   Plan: Medications:  continue current medications of detemir 87 units qhs, and metformin 1000mg  BID  Home glucose monitoring: Frequency:  TID WC Timing:   Instruction/counseling given: reminded to get eye exam, reminded to bring blood glucose meter & log to each visit, reminded to bring medications to each visit, discussed foot care, discussed the need for weight loss and discussed diet Educational resources provided: brochure Self management tools provided: copy of home glucose meter download Other plans: pt continues to do exercise and this was encouraged, HgbA1c and foot exam/referral was made per pt request to see podiatrist, refill on her supplies was given and insulin was also refilled

## 2013-10-23 NOTE — Patient Instructions (Signed)
General Instructions:   Thank you for bringing your medicines today. This helps [place] keep you safe from mistakes.   Progress Toward Treatment Goals:  Treatment Goal 09/29/2012  Hemoglobin A1C unchanged  Blood pressure unchanged    Self Care Goals & Plans:  Self Care Goal 10/23/2013  Manage my medications bring my medications to every visit; refill my medications on time; take my medicines as prescribed  Monitor my health keep track of my blood glucose; bring my glucose meter and log to each visit  Eat healthy foods drink diet soda or water instead of juice or soda; eat more vegetables; eat foods that are low in salt; eat baked foods instead of fried foods; eat fruit for snacks and desserts  Be physically active -    Home Blood Glucose Monitoring 09/29/2012  Check my blood sugar 3 times a day  When to check my blood sugar before breakfast; before dinner; at bedtime     Care Management & Community Referrals:  Referral 04/14/2012  Referrals made to [facility]

## 2013-10-24 NOTE — Progress Notes (Signed)
INTERNAL MEDICINE TEACHING ATTENDING ADDENDUM - Aldine Contes, MD: I reviewed and discussed with the resident Dr. Algis Liming, the patient's medical history, physical examination, diagnosis and results of pertinent tests and treatment and I agree with the patient's care as documented.

## 2013-10-25 ENCOUNTER — Other Ambulatory Visit: Payer: Self-pay | Admitting: Internal Medicine

## 2013-10-27 ENCOUNTER — Other Ambulatory Visit: Payer: Self-pay | Admitting: Internal Medicine

## 2013-11-03 ENCOUNTER — Telehealth: Payer: Self-pay | Admitting: Pulmonary Disease

## 2013-11-03 NOTE — Telephone Encounter (Signed)
Notes Recorded by Juanito Doom, MD on 11/01/2013 at 4:34 PM A, PLease let her know that this showed mild restriction but otherwise was normal. We will discuss more on the next visit Thanks   I spoke with patient about results and she verbalized understanding and had no questions

## 2013-11-06 ENCOUNTER — Ambulatory Visit (INDEPENDENT_AMBULATORY_CARE_PROVIDER_SITE_OTHER): Payer: Commercial Managed Care - HMO | Admitting: Ophthalmology

## 2013-11-13 ENCOUNTER — Ambulatory Visit (INDEPENDENT_AMBULATORY_CARE_PROVIDER_SITE_OTHER): Payer: Commercial Managed Care - HMO | Admitting: Podiatry

## 2013-11-13 DIAGNOSIS — B351 Tinea unguium: Secondary | ICD-10-CM

## 2013-11-13 DIAGNOSIS — M79673 Pain in unspecified foot: Secondary | ICD-10-CM

## 2013-11-13 DIAGNOSIS — M79609 Pain in unspecified limb: Secondary | ICD-10-CM

## 2013-11-14 NOTE — Progress Notes (Signed)
Patient ID: Kathleen [name], female   DOB: August 04, 1950, 63 y.o.   MRN: [id number]  Subjective: Orientated x3 white female presents complaining of painful toenails  Objective: Elongated, hypertrophic, discolored toenails 6-10  Assessment: Symptomatic onychomycoses 6-10  Plan: Debridement of toenails x10 without a bleeding  Reappoint at three-month intervals

## 2013-11-24 ENCOUNTER — Other Ambulatory Visit: Payer: Self-pay | Admitting: *Deleted

## 2013-11-24 DIAGNOSIS — K589 Irritable bowel syndrome without diarrhea: Secondary | ICD-10-CM

## 2013-11-24 DIAGNOSIS — T464X5A Adverse effect of angiotensin-converting-enzyme inhibitors, initial encounter: Secondary | ICD-10-CM

## 2013-11-24 DIAGNOSIS — G629 Polyneuropathy, unspecified: Secondary | ICD-10-CM

## 2013-11-24 DIAGNOSIS — R05 Cough: Secondary | ICD-10-CM

## 2013-11-24 DIAGNOSIS — R609 Edema, unspecified: Secondary | ICD-10-CM

## 2013-11-24 DIAGNOSIS — Z23 Encounter for immunization: Secondary | ICD-10-CM

## 2013-11-24 DIAGNOSIS — E785 Hyperlipidemia, unspecified: Secondary | ICD-10-CM

## 2013-11-24 DIAGNOSIS — R0600 Dyspnea, unspecified: Secondary | ICD-10-CM

## 2013-11-24 DIAGNOSIS — E119 Type 2 diabetes mellitus without complications: Secondary | ICD-10-CM

## 2013-11-24 MED ORDER — GLUCOSE BLOOD VI STRP: ORAL_STRIP | Status: DC

## 2013-12-04 ENCOUNTER — Other Ambulatory Visit: Payer: Self-pay | Admitting: *Deleted

## 2013-12-04 DIAGNOSIS — E119 Type 2 diabetes mellitus without complications: Secondary | ICD-10-CM

## 2013-12-04 DIAGNOSIS — T464X5A Adverse effect of angiotensin-converting-enzyme inhibitors, initial encounter: Secondary | ICD-10-CM

## 2013-12-04 DIAGNOSIS — R0600 Dyspnea, unspecified: Secondary | ICD-10-CM

## 2013-12-04 DIAGNOSIS — R058 Other specified cough: Secondary | ICD-10-CM

## 2013-12-04 DIAGNOSIS — Z23 Encounter for immunization: Secondary | ICD-10-CM

## 2013-12-04 DIAGNOSIS — E785 Hyperlipidemia, unspecified: Secondary | ICD-10-CM

## 2013-12-04 DIAGNOSIS — R05 Cough: Secondary | ICD-10-CM

## 2013-12-04 DIAGNOSIS — G629 Polyneuropathy, unspecified: Secondary | ICD-10-CM

## 2013-12-04 DIAGNOSIS — R609 Edema, unspecified: Secondary | ICD-10-CM

## 2013-12-04 DIAGNOSIS — K589 Irritable bowel syndrome without diarrhea: Secondary | ICD-10-CM

## 2013-12-04 MED ORDER — GLUCOSE BLOOD VI STRP: ORAL_STRIP | Status: DC

## 2013-12-04 NOTE — Telephone Encounter (Signed)
Pt is no longer using [facility], [facility] states they need a new script in order for insurance to pay

## 2013-12-24 ENCOUNTER — Other Ambulatory Visit: Payer: Self-pay | Admitting: Internal Medicine

## 2013-12-25 ENCOUNTER — Other Ambulatory Visit: Payer: Self-pay | Admitting: *Deleted

## 2013-12-25 MED ORDER — DICYCLOMINE HCL 20 MG PO TABS: 20.0000 mg | ORAL_TABLET | Freq: Three times a day (TID) | ORAL | Status: DC | PRN

## 2013-12-25 NOTE — Telephone Encounter (Signed)
Pt calls and leaves message stating she needs her bentyl and it was refused, states she was seen 6/29 for medication refill concerns, told to f/u in about 3 months and has appt for 10/1. She states she needs refills until her appt

## 2014-01-17 ENCOUNTER — Other Ambulatory Visit: Payer: Self-pay | Admitting: *Deleted

## 2014-01-17 DIAGNOSIS — R05 Cough: Secondary | ICD-10-CM

## 2014-01-17 DIAGNOSIS — G629 Polyneuropathy, unspecified: Secondary | ICD-10-CM

## 2014-01-17 DIAGNOSIS — Z23 Encounter for immunization: Secondary | ICD-10-CM

## 2014-01-17 DIAGNOSIS — R609 Edema, unspecified: Secondary | ICD-10-CM

## 2014-01-17 DIAGNOSIS — K589 Irritable bowel syndrome without diarrhea: Secondary | ICD-10-CM

## 2014-01-17 DIAGNOSIS — R0600 Dyspnea, unspecified: Secondary | ICD-10-CM

## 2014-01-17 DIAGNOSIS — E785 Hyperlipidemia, unspecified: Secondary | ICD-10-CM

## 2014-01-17 DIAGNOSIS — E119 Type 2 diabetes mellitus without complications: Secondary | ICD-10-CM

## 2014-01-17 DIAGNOSIS — T464X5A Adverse effect of angiotensin-converting-enzyme inhibitors, initial encounter: Secondary | ICD-10-CM

## 2014-01-17 MED ORDER — GABAPENTIN 300 MG PO CAPS: ORAL_CAPSULE | ORAL | Status: DC

## 2014-01-25 ENCOUNTER — Encounter: Payer: Self-pay | Admitting: Internal Medicine

## 2014-01-25 ENCOUNTER — Ambulatory Visit (INDEPENDENT_AMBULATORY_CARE_PROVIDER_SITE_OTHER): Payer: Commercial Managed Care - HMO | Admitting: Internal Medicine

## 2014-01-25 VITALS — BP 164/76 | HR 90 | Temp 98.5°F | Ht 61.0 in | Wt 284.5 lb

## 2014-01-25 DIAGNOSIS — Z23 Encounter for immunization: Secondary | ICD-10-CM

## 2014-01-25 DIAGNOSIS — R0902 Hypoxemia: Secondary | ICD-10-CM

## 2014-01-25 DIAGNOSIS — E1140 Type 2 diabetes mellitus with diabetic neuropathy, unspecified: Secondary | ICD-10-CM

## 2014-01-25 DIAGNOSIS — G629 Polyneuropathy, unspecified: Secondary | ICD-10-CM

## 2014-01-25 DIAGNOSIS — I10 Essential (primary) hypertension: Secondary | ICD-10-CM

## 2014-01-25 LAB — POCT GLYCOSYLATED HEMOGLOBIN (HGB A1C): HEMOGLOBIN A1C: 7

## 2014-01-25 LAB — GLUCOSE, CAPILLARY: Glucose-Capillary: 100 mg/dL — ABNORMAL HIGH (ref 70–99)

## 2014-01-25 MED ORDER — GLUCOSE BLOOD VI STRP: ORAL_STRIP | Status: DC

## 2014-01-25 NOTE — Assessment & Plan Note (Signed)
Lab Results  Component Value Date   HGBA1C 7.0 01/25/2014   HGBA1C 6.9 10/23/2013   HGBA1C 10.6 03/16/2013     Assessment: Diabetes control:  well controlled Progress toward A1C goal:   at goal Comments: pt has not been able to check blood sugars at home secondary to not being able to afford test strips  Plan: Medications:  continue current medications Home glucose monitoring: Frequency:   Timing:   Instruction/counseling given: reminded to get eye exam, reminded to bring blood glucose meter & log to each visit, discussed foot care, discussed the need for weight loss and discussed diet Educational resources provided: brochure Self management tools provided: copy of home glucose meter download Other plans: pt to follow up with optho for eye exam and has an appointment with podiatry

## 2014-01-25 NOTE — Assessment & Plan Note (Addendum)
SATURATION QUALIFICATIONS: (This note is used to comply with regulatory documentation for home oxygen)  Patient Saturations on Room Air at Rest = 93%  Patient Saturations on Room Air while Ambulating = 84%  Patient Saturations on 2 Liters of oxygen while Ambulating = 98%  Please briefly explain why patient needs home oxygen: Pt with hypoxia on ambulation as documented above. Pt also has documented nocturnal hypoxemia. I would recommend using O2 while ambulating and at night - Pt noted to have mild restrictive disease at pulm office

## 2014-01-25 NOTE — Patient Instructions (Signed)
- It was a pleasure seeing you again today - We will check your blood work today - I will refer you to opthalmology - We will also give you your flu shot today - I would advise you to wear your oxygen at night as well as while ambulating - Please attempt to maintain your diet and try to exercise  Calorie Counting for Weight Loss Calories are energy you get from the things you eat and drink. Your body uses this energy to keep you going throughout the day. The number of calories you eat affects your weight. When you eat more calories than your body needs, your body stores the extra calories as fat. When you eat fewer calories than your body needs, your body burns fat to get the energy it needs. Calorie counting means keeping track of how many calories you eat and drink each day. If you make sure to eat fewer calories than your body needs, you should lose weight. In order for calorie counting to work, you will need to eat the number of calories that are right for you in a day to lose a healthy amount of weight per week. A healthy amount of weight to lose per week is usually 1-2 lb (0.5-0.9 kg). A dietitian can determine how many calories you need in a day and give you suggestions on how to reach your calorie goal.  WHAT IS MY MY PLAN? My goal is to have __________ calories per day.  If I have this many calories per day, I should lose around __________ pounds per week. WHAT DO I NEED TO KNOW ABOUT CALORIE COUNTING? In order to meet your daily calorie goal, you will need to:  Find out how many calories are in each food you would like to eat. Try to do this before you eat.  Decide how much of the food you can eat.  Write down what you ate and how many calories it had. Doing this is called keeping a food log. WHERE DO I FIND CALORIE INFORMATION? The number of calories in a food can be found on a Nutrition Facts label. Note that all the information on a label is based on a specific serving of the food.  If a food does not have a Nutrition Facts label, try to look up the calories online or ask your dietitian for help. HOW DO I DECIDE HOW MUCH TO EAT? To decide how much of the food you can eat, you will need to consider both the number of calories in one serving and the size of one serving. This information can be found on the Nutrition Facts label. If a food does not have a Nutrition Facts label, look up the information online or ask your dietitian for help. Remember that calories are listed per serving. If you choose to have more than one serving of a food, you will have to multiply the calories per serving by the amount of servings you plan to eat. For example, the label on a package of bread might say that a serving size is 1 slice and that there are 90 calories in a serving. If you eat 1 slice, you will have eaten 90 calories. If you eat 2 slices, you will have eaten 180 calories. HOW DO I KEEP A FOOD LOG? After each meal, record the following information in your food log:  What you ate.  How much of it you ate.  How many calories it had.  Then, add up your calories.  Keep your food log near you, such as in a small notebook in your pocket. Another option is to use a mobile app or website. Some programs will calculate calories for you and show you how many calories you have left each time you add an item to the log. WHAT ARE SOME CALORIE COUNTING TIPS?  Use your calories on foods and drinks that will fill you up and not leave you hungry. Some examples of this include foods like nuts and nut butters, vegetables, lean proteins, and high-fiber foods (more than 5 g fiber per serving).  Eat nutritious foods and avoid empty calories. Empty calories are calories you get from foods or beverages that do not have many nutrients, such as candy and soda. It is better to have a nutritious high-calorie food (such as an avocado) than a food with few nutrients (such as a bag of chips).  Know how many calories  are in the foods you eat most often. This way, you do not have to look up how many calories they have each time you eat them.  Look out for foods that may seem like low-calorie foods but are really high-calorie foods, such as baked goods, soda, and fat-free candy.  Pay attention to calories in drinks. Drinks such as sodas, specialty coffee drinks, alcohol, and juices have a lot of calories yet do not fill you up. Choose low-calorie drinks like water and diet drinks.  Focus your calorie counting efforts on higher calorie items. Logging the calories in a garden salad that contains only vegetables is less important than calculating the calories in a milk shake.  Find a way of tracking calories that works for you. Get creative. Most people who are successful find ways to keep track of how much they eat in a day, even if they do not count every calorie. WHAT ARE SOME PORTION CONTROL TIPS?  Know how many calories are in a serving. This will help you know how many servings of a certain food you can have.  Use a measuring cup to measure serving sizes. This is helpful when you start out. With time, you will be able to estimate serving sizes for some foods.  Take some time to put servings of different foods on your favorite plates, bowls, and cups so you know what a serving looks like.  Try not to eat straight from a bag or box. Doing this can lead to overeating. Put the amount you would like to eat in a cup or on a plate to make sure you are eating the right portion.  Use smaller plates, glasses, and bowls to prevent overeating. This is a quick and easy way to practice portion control. If your plate is smaller, less food can fit on it.  Try not to multitask while eating, such as watching TV or using your computer. If it is time to eat, sit down at a table and enjoy your food. Doing this will help you to start recognizing when you are full. It will also make you more aware of what and how much you are  eating. HOW CAN I CALORIE COUNT WHEN EATING OUT?  Ask for smaller portion sizes or child-sized portions.  Consider sharing an entree and sides instead of getting your own entree.  If you get your own entree, eat only half. Ask for a box at the beginning of your meal and put the rest of your entree in it so you are not tempted to eat it.  Look  for the calories on the menu. If calories are listed, choose the lower calorie options.  Choose dishes that include vegetables, fruits, whole grains, low-fat dairy products, and lean protein. Focusing on smart food choices from each of the 5 food groups can help you stay on track at restaurants.  Choose items that are boiled, broiled, grilled, or steamed.  Choose water, milk, unsweetened iced tea, or other drinks without added sugars. If you want an alcoholic beverage, choose a lower calorie option. For example, a regular margarita can have up to 700 calories and a glass of wine has around 150.  Stay away from items that are buttered, battered, fried, or served with cream sauce. Items labeled "crispy" are usually fried, unless stated otherwise.  Ask for dressings, sauces, and syrups on the side. These are usually very high in calories, so do not eat much of them.  Watch out for salads. Many people think salads are a healthy option, but this is often not the case. Many salads come with bacon, fried chicken, lots of cheese, fried chips, and dressing. All of these items have a lot of calories. If you want a salad, choose a garden salad and ask for grilled meats or steak. Ask for the dressing on the side, or ask for olive oil and vinegar or lemon to use as dressing.  Estimate how many servings of a food you are given. For example, a serving of cooked rice is  cup or about the size of half a tennis ball or one cupcake wrapper. Knowing serving sizes will help you be aware of how much food you are eating at restaurants. The list below tells you how big or small  some common portion sizes are based on everyday objects.  1 oz--4 stacked dice.  3 oz--1 deck of cards.  1 tsp--1 dice.  1 Tbsp-- a Ping-Pong ball.  2 Tbsp--1 Ping-Pong ball.   cup--1 tennis ball or 1 cupcake wrapper.  1 cup--1 baseball. Document Released: 04/13/2005 Document Revised: 08/28/2013 Document Reviewed: 02/16/2013 [facility] Patient Information 2015 [place], [place]. This information is not intended to replace advice given to you by your health care provider. Make sure you discuss any questions you have with your health care provider.

## 2014-01-25 NOTE — Assessment & Plan Note (Signed)
BP Readings from Last 3 Encounters:  01/25/14 164/76  10/23/13 132/64  09/21/13 122/68    Lab Results  Component Value Date   NA 142 06/02/2013   K 4.5 06/02/2013   CREATININE 0.93 06/02/2013    Assessment: Blood pressure control:   Progress toward BP goal:    Comments: repeat BP was 138/45 after patient rested which is at goal for patient  Plan: Medications:  continue current medications Educational resources provided: brochure Self management tools provided: home blood pressure logbook Other plans: will recheck BP on next visit

## 2014-01-25 NOTE — Progress Notes (Signed)
   Subjective:    Patient ID: Kathleen [name], female    DOB: Sep 07, 1950, 63 y.o.   MRN: [id number]  HPI Pt here for routine follow up. She complains of mild SOB when she ambulates. No cough, no fevers She also complains of burning pain over both LE and feet especially at night and she has difficulty sleeping secondary to the pain.     Review of Systems  Constitutional: Negative.   HENT: Negative.   Eyes: Negative.   Respiratory: Positive for shortness of breath. Negative for cough, choking, wheezing and stridor.   Cardiovascular: Negative.   Gastrointestinal: Negative.   Endocrine: Negative.   Genitourinary: Negative.   Musculoskeletal: Positive for gait problem.  Skin: Negative.   Neurological: Negative.   Psychiatric/Behavioral: Negative.        Objective:   Physical Exam  Constitutional: She is oriented to person, place, and time. She appears well-developed and well-nourished.  HENT:  Head: Normocephalic and atraumatic.  Eyes: Conjunctivae are normal.  Neck: Normal range of motion.  Cardiovascular: Normal rate and regular rhythm.   Pulmonary/Chest: Effort normal and breath sounds normal. She has no wheezes. She has no rales.  Abdominal: Soft. Bowel sounds are normal. There is no tenderness. There is no rebound.  Musculoskeletal: Normal range of motion. She exhibits no edema.  Neurological: She is alert and oriented to person, place, and time.  Skin: Skin is warm and dry.  Psychiatric: She has a normal mood and affect. Her behavior is normal.          Assessment & Plan:  Pt was due for flu shot and received it today  Please see problem based charting for assessment and plan:

## 2014-01-25 NOTE — Assessment & Plan Note (Signed)
-   pt is compliant with neurontin but has stopped taking elavil secondary to increased daytime somnolence - encouraged patient to take elavil if she is not driving the next day and to reassess her pain

## 2014-01-26 ENCOUNTER — Other Ambulatory Visit: Payer: Self-pay | Admitting: Internal Medicine

## 2014-01-26 ENCOUNTER — Telehealth: Payer: Self-pay | Admitting: Dietician

## 2014-01-26 DIAGNOSIS — E1140 Type 2 diabetes mellitus with diabetic neuropathy, unspecified: Secondary | ICD-10-CM

## 2014-01-26 DIAGNOSIS — G629 Polyneuropathy, unspecified: Secondary | ICD-10-CM

## 2014-01-26 MED ORDER — GLUCOSE BLOOD VI STRP: ORAL_STRIP | Status: DC

## 2014-01-26 NOTE — Telephone Encounter (Signed)
Asked [facility] to try [facility] strips [facility] for 50 at a time: not covered by medicaid anymore, [employer] tells them 50 strips is 72.11, will call [employer] on  Monday

## 2014-01-29 NOTE — Telephone Encounter (Signed)
Unable to get through to [employer].

## 2014-01-30 NOTE — Telephone Encounter (Addendum)
Spoke with Lennette Bihari from [facility] he says she only has Medicare D (Drug coverage) with them and that it is her Medicare B that pays for testing supplies. [facility] they  have a copy of Caragh's Medicare A & B card on file and they tried running the strips through on Medicare part B: it went through, but cost is 72.00 and she got the message she got was to bill the [facility] part D part of her medicare.  They were running it under B and they were covering until her last fill in May 7th. They suggested as did [employer] that she call Medicare B.  Called Medicare B and they say that something changed may- September in patient's Medicare carrier and their records are showing that her [facility] is an advantage plan and that they should take care of her Medicare A, B and D.   I will request assistance of social work and/or [employer].

## 2014-01-31 ENCOUNTER — Encounter: Payer: Self-pay | Admitting: *Deleted

## 2014-01-31 NOTE — Telephone Encounter (Signed)
I spoke with patient and informed her she will be getting supplies via mail order and also gave her the number of the [facility]. She expresses understanding

## 2014-01-31 NOTE — Telephone Encounter (Signed)
Thank you.  CSW contacted [employer].  Pt had prescription for test strips and lancets sent to [employer] in June 2015.  However, prescription was missing directions and was faxed back to [facility].  [facility] showed direction faxed back to [employer] in June 2015.  [employer] has no indication of added directions fax receipt, prescription was canceled.  In order to facilitate process of a 90 day supply of lancets and test strips per June 2015 order, verbal order placed with [employer] directly to pharmacist for immediate processing.  Pt will receive lancets and test strips via mail order.  Pt is not to utilize local pharmacy for these supplies.    Butch Penny can you please contact patient and let her know [facility] mail order will be sending a 90 days supply for test strips and lancets with one refill.  [place] [phone].  Pharmacist Lequita Asal.

## 2014-02-19 ENCOUNTER — Encounter: Payer: Self-pay | Admitting: Podiatry

## 2014-02-19 ENCOUNTER — Ambulatory Visit (INDEPENDENT_AMBULATORY_CARE_PROVIDER_SITE_OTHER): Payer: Commercial Managed Care - HMO | Admitting: Podiatry

## 2014-02-19 DIAGNOSIS — B351 Tinea unguium: Secondary | ICD-10-CM

## 2014-02-19 DIAGNOSIS — M79676 Pain in unspecified toe(s): Secondary | ICD-10-CM

## 2014-02-20 NOTE — Progress Notes (Signed)
Patient ID: Kathleen [name], female   DOB: Mar 09, 1951, 63 y.o.   MRN: [id number]  Subjective: This patient presents again complaining of painful toenails  Objective: Elongated, hypertrophic, incurvated toenails 6-10  Assessment: Symptomatic onychomycosis 6-10  Plan: 5 toenails 10 without any bleeding  Reappointed 3 months

## 2014-02-23 ENCOUNTER — Ambulatory Visit (INDEPENDENT_AMBULATORY_CARE_PROVIDER_SITE_OTHER): Payer: Commercial Managed Care - HMO | Admitting: Ophthalmology

## 2014-02-23 DIAGNOSIS — E11319 Type 2 diabetes mellitus with unspecified diabetic retinopathy without macular edema: Secondary | ICD-10-CM

## 2014-02-23 DIAGNOSIS — E11329 Type 2 diabetes mellitus with mild nonproliferative diabetic retinopathy without macular edema: Secondary | ICD-10-CM

## 2014-02-23 DIAGNOSIS — H35033 Hypertensive retinopathy, bilateral: Secondary | ICD-10-CM

## 2014-02-23 DIAGNOSIS — I10 Essential (primary) hypertension: Secondary | ICD-10-CM

## 2014-02-23 DIAGNOSIS — E11339 Type 2 diabetes mellitus with moderate nonproliferative diabetic retinopathy without macular edema: Secondary | ICD-10-CM

## 2014-02-23 DIAGNOSIS — H43813 Vitreous degeneration, bilateral: Secondary | ICD-10-CM

## 2014-02-23 LAB — HM DIABETES EYE EXAM

## 2014-02-24 ENCOUNTER — Other Ambulatory Visit: Payer: Self-pay | Admitting: Internal Medicine

## 2014-03-08 ENCOUNTER — Other Ambulatory Visit: Payer: Self-pay | Admitting: Internal Medicine

## 2014-04-09 ENCOUNTER — Other Ambulatory Visit: Payer: Self-pay | Admitting: Internal Medicine

## 2014-04-18 ENCOUNTER — Other Ambulatory Visit: Payer: Self-pay | Admitting: Internal Medicine

## 2014-05-01 ENCOUNTER — Other Ambulatory Visit: Payer: Self-pay | Admitting: Internal Medicine

## 2014-05-01 ENCOUNTER — Other Ambulatory Visit: Payer: Self-pay | Admitting: *Deleted

## 2014-05-01 DIAGNOSIS — I10 Essential (primary) hypertension: Secondary | ICD-10-CM

## 2014-05-01 MED ORDER — LOSARTAN POTASSIUM 50 MG PO TABS: 50.0000 mg | ORAL_TABLET | Freq: Every day | ORAL | Status: DC

## 2014-05-01 NOTE — Telephone Encounter (Signed)
Pt called - [facility] [facility] [place]. Hilda Blades Abbott Jasinski RN 05/01/14 2:20PM

## 2014-05-01 NOTE — Telephone Encounter (Signed)
Rx 12/14 for a 2 month supply. Has RF

## 2014-05-22 ENCOUNTER — Other Ambulatory Visit: Payer: Self-pay | Admitting: Internal Medicine

## 2014-05-25 ENCOUNTER — Other Ambulatory Visit: Payer: Self-pay | Admitting: Internal Medicine

## 2014-05-28 ENCOUNTER — Ambulatory Visit (INDEPENDENT_AMBULATORY_CARE_PROVIDER_SITE_OTHER): Payer: Commercial Managed Care - HMO | Admitting: Podiatry

## 2014-05-28 ENCOUNTER — Encounter: Payer: Self-pay | Admitting: Podiatry

## 2014-05-28 DIAGNOSIS — B351 Tinea unguium: Secondary | ICD-10-CM | POA: Diagnosis not present

## 2014-05-28 DIAGNOSIS — M79676 Pain in unspecified toe(s): Secondary | ICD-10-CM | POA: Diagnosis not present

## 2014-05-28 NOTE — Progress Notes (Signed)
Patient ID: Kathleen [name], female   DOB: July 19, 1950, 64 y.o.   MRN: [id number]     subjective: This patient presents again today complaining of painful toenails and a painful skin lesion on the posterior right heel which is treating with topical skin lotion and Vaseline  Objective: The toenails are elongated, hypertrophic, incurvated, and tender to palpation 6-10 Fissure posterior right heel without inflammation surrounding the fissure  Assessment: Symptomatic onychomycoses 6-10 Noninfected fissure posterior right heel  Plan: Debridement toenails 10 without a bleeding Advised patient apply skin lotion or Vaseline to the fissure on the posterior right heel twice daily and at night occlude with Saran wrap until healed

## 2014-05-28 NOTE — Patient Instructions (Signed)
Apply skin lotion or Vaseline to the crack in the right heel daily Wear cotton socks At night apply skin lotion or Vaseline to right heel, cover with kitchen Saran wrap and hold on with sock repeat nightly until healed

## 2014-06-05 ENCOUNTER — Other Ambulatory Visit: Payer: Self-pay | Admitting: Internal Medicine

## 2014-06-07 ENCOUNTER — Encounter: Payer: Self-pay | Admitting: Internal Medicine

## 2014-06-07 ENCOUNTER — Ambulatory Visit (INDEPENDENT_AMBULATORY_CARE_PROVIDER_SITE_OTHER): Payer: Commercial Managed Care - HMO | Admitting: Internal Medicine

## 2014-06-07 ENCOUNTER — Other Ambulatory Visit: Payer: Self-pay | Admitting: Internal Medicine

## 2014-06-07 VITALS — BP 164/70 | HR 58 | Temp 98.2°F | Wt 275.2 lb

## 2014-06-07 DIAGNOSIS — J069 Acute upper respiratory infection, unspecified: Secondary | ICD-10-CM

## 2014-06-07 DIAGNOSIS — R0902 Hypoxemia: Secondary | ICD-10-CM | POA: Diagnosis not present

## 2014-06-07 DIAGNOSIS — G629 Polyneuropathy, unspecified: Secondary | ICD-10-CM

## 2014-06-07 DIAGNOSIS — E1140 Type 2 diabetes mellitus with diabetic neuropathy, unspecified: Secondary | ICD-10-CM

## 2014-06-07 DIAGNOSIS — E119 Type 2 diabetes mellitus without complications: Secondary | ICD-10-CM | POA: Diagnosis not present

## 2014-06-07 DIAGNOSIS — I10 Essential (primary) hypertension: Secondary | ICD-10-CM

## 2014-06-07 LAB — POCT GLYCOSYLATED HEMOGLOBIN (HGB A1C): HEMOGLOBIN A1C: 8

## 2014-06-07 LAB — GLUCOSE, CAPILLARY: Glucose-Capillary: 166 mg/dL — ABNORMAL HIGH (ref 70–99)

## 2014-06-07 MED ORDER — SALINE SPRAY 0.65 % NA SOLN: 1.0000 | NASAL | Status: DC | PRN

## 2014-06-07 MED ORDER — GUAIFENESIN-DM 100-10 MG/5ML PO SYRP: 5.0000 mL | ORAL_SOLUTION | ORAL | Status: DC | PRN

## 2014-06-07 MED ORDER — FUROSEMIDE 40 MG PO TABS: 40.0000 mg | ORAL_TABLET | Freq: Every day | ORAL | Status: DC

## 2014-06-07 NOTE — Assessment & Plan Note (Addendum)
Lab Results  Component Value Date   HGBA1C 8.0 06/07/2014   HGBA1C 7.0 01/25/2014   HGBA1C 6.9 10/23/2013     Assessment: Diabetes control:  fair Progress toward A1C goal:   deteriorated Comments: pt unable to exercise secondary to knee pain and has been increasing weight  Plan: Medications:  continue current medications Home glucose monitoring: Frequency:   Timing:   Instruction/counseling given: reminded to bring blood glucose meter & log to each visit, reminded to bring medications to each visit, discussed the need for weight loss and discussed diet Educational resources provided:   Self management tools provided: copy of home glucose meter download Other plans: Will check urine microalbumin, lipid profile today. Will refer to Butch Penny for diabetes education. Will follow up in 1 month.   Addendum: - pt with mildly elevated BP and elevated urine microalbumin creatinine ratio. Discussed with patient and asked her to increase cozaar to 2 pills once a day for now

## 2014-06-07 NOTE — Assessment & Plan Note (Addendum)
BP Readings from Last 3 Encounters:  06/07/14 164/70  01/25/14 164/76  10/23/13 132/64    Lab Results  Component Value Date   NA 142 06/02/2013   K 4.5 06/02/2013   CREATININE 0.93 06/02/2013    Assessment: Blood pressure control:  fair Progress toward BP goal:   unchanged Comments: Pt with elevated BP on ambulation but improved on rest.  Plan: Medications:  continue current medications Educational resources provided:   Self management tools provided:   Other plans: Would increase cozaar to 100 mg if elevated BP persists at next visit  Addendum: - given elevated urine microalbumin:creatinine ratio and increased BP will increase cozaar to 100 mg (Two 50 mg pills once a day). Discussed with patient who is in agreement

## 2014-06-07 NOTE — Progress Notes (Signed)
   Subjective:    Patient ID: Kathleen [name], female    DOB: 1950/10/19, 64 y.o.   MRN: [id number]  HPI Pt here for routine follow up.   She states that over the last week she has noted a runny nose, cough especially at night which was productive of whitish phlegm and a sore throat with painful swallowing (over the last 2 days), She denies fevers but complains of occasional chills.   She also complains of persistent burning of her feet despite being on gabapentin and elavil (she takes elavil as needed)  Her blood sugars are ususally within range but has had some readings in the 200s with the highest being 249. She states she is putting on weight and unable to exercise secondary to left knee pain, which is chronic.   Review of Systems  Constitutional: Negative.   HENT: Positive for rhinorrhea and sore throat.   Eyes: Negative.   Respiratory: Positive for cough. Negative for shortness of breath, wheezing and stridor.   Cardiovascular: Negative.   Gastrointestinal: Negative.   Genitourinary: Negative.   Musculoskeletal: Positive for arthralgias.       Pt with bilateral knee pain left >right  Skin: Negative.   Neurological: Negative.        Objective:   Physical Exam  Constitutional: She is oriented to person, place, and time. She appears well-developed and well-nourished.  HENT:  Head: Normocephalic and atraumatic.  Eyes: Conjunctivae are normal.  Neck: Normal range of motion.  Cardiovascular: Normal rate and regular rhythm.   Pulmonary/Chest: Breath sounds normal. No respiratory distress. She has no wheezes. She has no rales.  Abdominal: Soft. Bowel sounds are normal. She exhibits no distension. There is no tenderness. There is no rebound.  Musculoskeletal: Normal range of motion. She exhibits no edema.  Neurological: She is alert and oriented to person, place, and time.  Skin: Skin is warm and dry.          Assessment & Plan:  Please see problem based charting for  assessment and plan:

## 2014-06-07 NOTE — Assessment & Plan Note (Deleted)
-   Pt with acute upper respiratory infection -likely viral - No pharyngeal erythema or exudates - Will give Robitussin DM and saline nasal spray and attempt to manage conservatively - Pt encouraged to do warm salt water gargles - She is to follow up if persistent or worsening symptoms

## 2014-06-07 NOTE — Patient Instructions (Addendum)
-   It was a pleasure seeing you today - We will get your blood work today and follow up - Will start you on guaifenesin and saline nasal spray for your upper respiratory infection - Start warm salt water gargles 2-3 time a day - Please call if your symptoms worsen - Start taking your elavil every night and follow up in 1 month

## 2014-06-07 NOTE — Progress Notes (Signed)
SATURATION QUALIFICATIONS: (This note is used to comply with regulatory documentation for home oxygen)  Patient Saturations on Room Air at Rest = 94%   Patient Saturations on Room Air while Ambulating = 87%  Patient Saturations on 2 Liters of oxygen at Rest =  94%  Patient Saturations on 2 Liters of oxygen while Ambulating = 91 %  Please briefly explain why patient needs home oxygen:

## 2014-06-07 NOTE — Assessment & Plan Note (Signed)
-   continue with gabapentin - Patient advised to take 1/2 pill of elavil every night.  - Will reassess in 1 month

## 2014-06-07 NOTE — Assessment & Plan Note (Signed)
-   Pt with acute upper respiratory infection - likely viral - No pharyngeal erythema or exudates - Will give Robitussin DM and saline nasal spray and attempt to manage conservatively - Pt encouraged to do warm salt water gargles - She is to follow up if persistent or worsening symptoms

## 2014-06-07 NOTE — Assessment & Plan Note (Addendum)
  SATURATION QUALIFICATIONS: (This note is used to comply with regulatory documentation for home oxygen)  Patient Saturations on Room Air at Rest = 94%   Patient Saturations on Room Air while Ambulating = 87%  Patient Saturations on 2 Liters of oxygen at Rest = 94%  Patient Saturations on 2 Liters of oxygen while Ambulating = 91 %  Please briefly explain why patient needs home oxygen: Pt with hypoxia on ambulation as documented above. I would recommend continued O2 use while ambulating and at night. Hypoxia possibly secondary to mild restrictive lung disease noted at pulm office

## 2014-06-08 LAB — LIPID PANEL
Cholesterol: 141 mg/dL (ref 0–200)
HDL: 40 mg/dL (ref >39)
LDL Cholesterol: 85 mg/dL (ref 0–99)
Total CHOL/HDL Ratio: 3.5 Ratio
Triglycerides: 82 mg/dL (ref <150)
VLDL: 16 mg/dL (ref 0–40)

## 2014-06-08 LAB — BASIC METABOLIC PANEL WITH GFR
BUN: 31 mg/dL — ABNORMAL HIGH (ref 6–23)
CHLORIDE: 106 meq/L (ref 96–112)
CO2: 28 mEq/L (ref 19–32)
Calcium: 9.7 mg/dL (ref 8.4–10.5)
Creat: 0.94 mg/dL (ref 0.50–1.10)
GFR, EST AFRICAN AMERICAN: 75 mL/min
GFR, EST NON AFRICAN AMERICAN: 65 mL/min
Glucose, Bld: 174 mg/dL — ABNORMAL HIGH (ref 70–99)
Potassium: 4.7 mEq/L (ref 3.5–5.3)
Sodium: 143 mEq/L (ref 135–145)

## 2014-06-08 LAB — MICROALBUMIN / CREATININE URINE RATIO
Creatinine, Urine: 59.8 mg/dL
Microalb Creat Ratio: 469.9 mg/g — ABNORMAL HIGH (ref 0.0–30.0)
Microalb, Ur: 28.1 mg/dL — ABNORMAL HIGH (ref <2.0)

## 2014-06-08 MED ORDER — LOSARTAN POTASSIUM 50 MG PO TABS: 100.0000 mg | ORAL_TABLET | Freq: Every day | ORAL | Status: DC

## 2014-06-08 NOTE — Addendum Note (Signed)
Addended by: Aldine Contes on: 06/08/2014 02:56 PM   Modules accepted: Orders

## 2014-06-12 ENCOUNTER — Other Ambulatory Visit: Payer: Self-pay | Admitting: Internal Medicine

## 2014-06-21 DIAGNOSIS — H40013 Open angle with borderline findings, low risk, bilateral: Secondary | ICD-10-CM | POA: Diagnosis not present

## 2014-06-21 LAB — HM DIABETES EYE EXAM

## 2014-06-29 ENCOUNTER — Other Ambulatory Visit: Payer: Self-pay | Admitting: Internal Medicine

## 2014-07-10 ENCOUNTER — Telehealth: Payer: Self-pay | Admitting: Internal Medicine

## 2014-07-10 NOTE — Telephone Encounter (Signed)
Call to patient to confirm appointment for 07/11/14 at 10:15 lmtcb

## 2014-07-11 ENCOUNTER — Encounter: Payer: Self-pay | Admitting: Internal Medicine

## 2014-07-11 ENCOUNTER — Ambulatory Visit (INDEPENDENT_AMBULATORY_CARE_PROVIDER_SITE_OTHER): Payer: Commercial Managed Care - HMO | Admitting: Internal Medicine

## 2014-07-11 VITALS — BP 135/51 | HR 51 | Temp 98.2°F | Ht 61.0 in | Wt 272.2 lb

## 2014-07-11 DIAGNOSIS — E1140 Type 2 diabetes mellitus with diabetic neuropathy, unspecified: Secondary | ICD-10-CM

## 2014-07-11 DIAGNOSIS — Z0000 Encounter for general adult medical examination without abnormal findings: Secondary | ICD-10-CM

## 2014-07-11 DIAGNOSIS — I10 Essential (primary) hypertension: Secondary | ICD-10-CM | POA: Diagnosis not present

## 2014-07-11 DIAGNOSIS — E119 Type 2 diabetes mellitus without complications: Secondary | ICD-10-CM

## 2014-07-11 LAB — GLUCOSE, CAPILLARY: Glucose-Capillary: 179 mg/dL — ABNORMAL HIGH (ref 70–99)

## 2014-07-11 MED ORDER — GABAPENTIN 300 MG PO CAPS: 300.0000 mg | ORAL_CAPSULE | Freq: Three times a day (TID) | ORAL | Status: DC

## 2014-07-11 MED ORDER — LOSARTAN POTASSIUM 50 MG PO TABS: 50.0000 mg | ORAL_TABLET | Freq: Every day | ORAL | Status: DC

## 2014-07-11 MED ORDER — DICYCLOMINE HCL 20 MG PO TABS: ORAL_TABLET | ORAL | Status: DC

## 2014-07-11 NOTE — Patient Instructions (Signed)
-   It was a pleasure seeing you again - I have refilled your bentyl and gabapentin - Please take only 1 pill of your cozaar every day. If your blood pressure becomes uncontrolled we will increase it to 2 pills - Your blood sugars are much improved. Keep up the great work! - Please continue with your exercises. Your weight is down 3 lbs  Exercise to Lose Weight Exercise and a healthy diet may help you lose weight. Your doctor may suggest specific exercises. EXERCISE IDEAS AND TIPS  Choose low-cost things you enjoy doing, such as walking, bicycling, or exercising to workout videos.  Take stairs instead of the elevator.  Walk during your lunch break.  Park your car further away from work or school.  Go to a gym or an exercise class.  Start with 5 to 10 minutes of exercise each day. Build up to 30 minutes of exercise 4 to 6 days a week.  Wear shoes with good support and comfortable clothes.  Stretch before and after working out.  Work out until you breathe harder and your heart beats faster.  Drink extra water when you exercise.  Do not do so much that you hurt yourself, feel dizzy, or get very short of breath. Exercises that burn about 150 calories:  Running 1  miles in 15 minutes.  Playing volleyball for 45 to 60 minutes.  Washing and waxing a car for 45 to 60 minutes.  Playing touch football for 45 minutes.  Walking 1  miles in 35 minutes.  Pushing a stroller 1  miles in 30 minutes.  Playing basketball for 30 minutes.  Raking leaves for 30 minutes.  Bicycling 5 miles in 30 minutes.  Walking 2 miles in 30 minutes.  Dancing for 30 minutes.  Shoveling snow for 15 minutes.  Swimming laps for 20 minutes.  Walking up stairs for 15 minutes.  Bicycling 4 miles in 15 minutes.  Gardening for 30 to 45 minutes.  Jumping rope for 15 minutes.  Washing windows or floors for 45 to 60 minutes. Document Released: 05/16/2010 Document Revised: 07/06/2011 Document  Reviewed: 05/16/2010 [facility] Patient Information 2015 [place], [place]. This information is not intended to replace advice given to you by your health care provider. Make sure you discuss any questions you have with your health care provider.

## 2014-07-11 NOTE — Assessment & Plan Note (Signed)
Lab Results  Component Value Date   HGBA1C 8.0 06/07/2014   HGBA1C 7.0 01/25/2014   HGBA1C 6.9 10/23/2013     Assessment: Diabetes control:  fair Progress toward A1C goal:   deteriorated Comments: Patients blood sugars are much improved since last visit. She ahs been losing weight and exercising  Plan: Medications:  continue current medications Home glucose monitoring: Frequency:   Timing:   Instruction/counseling given: reminded to bring blood glucose meter & log to each visit, reminded to bring medications to each visit and discussed diet Educational resources provided: brochure (denies) Self management tools provided: copy of home glucose meter download Other plans: Will check A1C on next visit

## 2014-07-11 NOTE — Progress Notes (Signed)
   Subjective:    Patient ID: Kathleen [name], female    DOB: October 17, 1950, 64 y.o.   MRN: [id number]  HPI Patient is here for follow up of her HTN and DM. She feels well and has no new complaints. She also wanted to clarify the dose of her cozaar as she thought she was on 50 mg and not on 100 mg and has been taking only 50 mg. Please refer to problem based charting for details   Review of Systems  Constitutional: Negative.   HENT: Negative.   Eyes: Negative.   Respiratory: Negative.   Cardiovascular: Negative.   Gastrointestinal: Negative.   Musculoskeletal: Positive for arthralgias. Negative for back pain, joint swelling and gait problem.       States she has chronic joint pain but it has improved as her weight has decreased  Skin: Negative.   Neurological: Negative.   Psychiatric/Behavioral: Negative.        Objective:   Physical Exam  Constitutional: She is oriented to person, place, and time. She appears well-developed and well-nourished.  HENT:  Head: Normocephalic and atraumatic.  Eyes: Conjunctivae are normal. Right eye exhibits no discharge. Left eye exhibits no discharge. No scleral icterus.  Neck: Normal range of motion.  Cardiovascular: Normal rate, regular rhythm and normal heart sounds.   No murmur heard. Pulmonary/Chest: Effort normal and breath sounds normal. No respiratory distress. She has no wheezes.  Abdominal: Soft. Bowel sounds are normal. She exhibits no distension. There is no tenderness.  Musculoskeletal: Normal range of motion. She exhibits no edema or tenderness.  Neurological: She is alert and oriented to person, place, and time.  Skin: Skin is warm and dry.  Psychiatric: She has a normal mood and affect. Her behavior is normal.          Assessment & Plan:

## 2014-07-11 NOTE — Assessment & Plan Note (Signed)
BP Readings from Last 3 Encounters:  07/11/14 135/51  06/07/14 164/70  01/25/14 164/76    Lab Results  Component Value Date   NA 143 06/07/2014   K 4.7 06/07/2014   CREATININE 0.94 06/07/2014    Assessment: Blood pressure control:  well controlled Progress toward BP goal:   At goal Comments: Patient states BP has been well controlled at home on cozaar 50 mg and lasix  Plan: Medications:  continue current medications Educational resources provided: brochure (denies) Self management tools provided:   Other plans: Will f/u BP on next visit and if elevated would increase cozaar to 100 mg (pt also with increased microalbumin:creatinine ratio)

## 2014-07-11 NOTE — Assessment & Plan Note (Signed)
-   Patient given prescription for shingles vaccine

## 2014-07-12 ENCOUNTER — Other Ambulatory Visit: Payer: Self-pay

## 2014-07-12 DIAGNOSIS — Z1231 Encounter for screening mammogram for malignant neoplasm of breast: Secondary | ICD-10-CM

## 2014-08-02 ENCOUNTER — Other Ambulatory Visit: Payer: Self-pay | Admitting: Internal Medicine

## 2014-08-07 ENCOUNTER — Ambulatory Visit
Admission: RE | Admit: 2014-08-07 | Discharge: 2014-08-07 | Disposition: A | Discharge status: Home / Self Care | Payer: Commercial Managed Care - HMO | Source: Ambulatory Visit

## 2014-08-07 DIAGNOSIS — Z1231 Encounter for screening mammogram for malignant neoplasm of breast: Secondary | ICD-10-CM | POA: Diagnosis not present

## 2014-08-17 ENCOUNTER — Other Ambulatory Visit: Payer: Self-pay | Admitting: Internal Medicine

## 2014-08-22 ENCOUNTER — Other Ambulatory Visit: Payer: Self-pay | Admitting: Internal Medicine

## 2014-08-27 ENCOUNTER — Encounter: Payer: Self-pay | Admitting: *Deleted

## 2014-08-29 ENCOUNTER — Ambulatory Visit (INDEPENDENT_AMBULATORY_CARE_PROVIDER_SITE_OTHER): Payer: Commercial Managed Care - HMO | Admitting: Ophthalmology

## 2014-08-29 DIAGNOSIS — H2513 Age-related nuclear cataract, bilateral: Secondary | ICD-10-CM

## 2014-08-29 DIAGNOSIS — H33302 Unspecified retinal break, left eye: Secondary | ICD-10-CM

## 2014-08-29 DIAGNOSIS — E11339 Type 2 diabetes mellitus with moderate nonproliferative diabetic retinopathy without macular edema: Secondary | ICD-10-CM

## 2014-08-29 DIAGNOSIS — H43813 Vitreous degeneration, bilateral: Secondary | ICD-10-CM | POA: Diagnosis not present

## 2014-08-29 DIAGNOSIS — E11319 Type 2 diabetes mellitus with unspecified diabetic retinopathy without macular edema: Secondary | ICD-10-CM | POA: Diagnosis not present

## 2014-08-29 DIAGNOSIS — H35033 Hypertensive retinopathy, bilateral: Secondary | ICD-10-CM

## 2014-08-29 DIAGNOSIS — I10 Essential (primary) hypertension: Secondary | ICD-10-CM | POA: Diagnosis not present

## 2014-08-29 DIAGNOSIS — E11329 Type 2 diabetes mellitus with mild nonproliferative diabetic retinopathy without macular edema: Secondary | ICD-10-CM | POA: Diagnosis not present

## 2014-08-29 LAB — HM DIABETES EYE EXAM

## 2014-09-03 ENCOUNTER — Encounter: Payer: Self-pay | Admitting: Podiatry

## 2014-09-03 ENCOUNTER — Ambulatory Visit (INDEPENDENT_AMBULATORY_CARE_PROVIDER_SITE_OTHER): Payer: Commercial Managed Care - HMO | Admitting: Podiatry

## 2014-09-03 DIAGNOSIS — B351 Tinea unguium: Secondary | ICD-10-CM

## 2014-09-03 DIAGNOSIS — M79676 Pain in unspecified toe(s): Secondary | ICD-10-CM | POA: Diagnosis not present

## 2014-09-03 NOTE — Patient Instructions (Signed)
Diabetes and Foot Care Diabetes may cause you to have problems because of poor blood supply (circulation) to your feet and legs. This may cause the skin on your feet to become thinner, break easier, and heal more slowly. Your skin may become dry, and the skin may peel and crack. You may also have nerve damage in your legs and feet causing decreased feeling in them. You may not notice minor injuries to your feet that could lead to infections or more serious problems. Taking care of your feet is one of the most important things you can do for yourself.  HOME CARE INSTRUCTIONS  Wear shoes at all times, even in the house. Do not go barefoot. Bare feet are easily injured.  Check your feet daily for blisters, cuts, and redness. If you cannot see the bottom of your feet, use a mirror or ask someone for help.  Wash your feet with warm water (do not use hot water) and mild soap. Then pat your feet and the areas between your toes until they are completely dry. Do not soak your feet as this can dry your skin.  Apply a moisturizing lotion or petroleum jelly (that does not contain alcohol and is unscented) to the skin on your feet and to dry, brittle toenails. Do not apply lotion between your toes.  Trim your toenails straight across. Do not dig under them or around the cuticle. File the edges of your nails with an emery board or nail file.  Do not cut corns or calluses or try to remove them with medicine.  Wear clean socks or stockings every day. Make sure they are not too tight. Do not wear knee-high stockings since they may decrease blood flow to your legs.  Wear shoes that fit properly and have enough cushioning. To break in new shoes, wear them for just a few hours a day. This prevents you from injuring your feet. Always look in your shoes before you put them on to be sure there are no objects inside.  Do not cross your legs. This may decrease the blood flow to your feet.  If you find a minor scrape,  cut, or break in the skin on your feet, keep it and the skin around it clean and dry. These areas may be cleansed with mild soap and water. Do not cleanse the area with peroxide, alcohol, or iodine.  When you remove an adhesive bandage, be sure not to damage the skin around it.  If you have a wound, look at it several times a day to make sure it is healing.  Do not use heating pads or hot water bottles. They may burn your skin. If you have lost feeling in your feet or legs, you may not know it is happening until it is too late.  Make sure your health care provider performs a complete foot exam at least annually or more often if you have foot problems. Report any cuts, sores, or bruises to your health care provider immediately. SEEK MEDICAL CARE IF:   You have an injury that is not healing.  You have cuts or breaks in the skin.  You have an ingrown nail.  You notice redness on your legs or feet.  You feel burning or tingling in your legs or feet.  You have pain or cramps in your legs and feet.  Your legs or feet are numb.  Your feet always feel cold. SEEK IMMEDIATE MEDICAL CARE IF:   There is increasing redness,   swelling, or pain in or around a wound.  There is a red line that goes up your leg.  Pus is coming from a wound.  You develop a fever or as directed by your health care provider.  You notice a bad smell coming from an ulcer or wound. Document Released: 04/10/2000 Document Revised: 12/14/2012 Document Reviewed: 09/20/2012 [employer] Patient Information 2015 [employer]. This information is not intended to replace advice given to you by your health care provider. Make sure you discuss any questions you have with your health care provider.  

## 2014-09-04 NOTE — Progress Notes (Signed)
Patient ID: Kathleen [name], female   DOB: Nov 17, 1950, 64 y.o.   MRN: [id number]  Subjective: This patient presents again complaining of painful toenails and requests nail debridement  Objective: The toenails are hypertrophic, elongated, discolored and tender direct palpation  Assessment: Symptomatic onychomycoses 6-10 Diabetic  Plan: Debridement of toenails 10 without any bleeding  Reappoint 3 months

## 2014-09-07 ENCOUNTER — Ambulatory Visit (INDEPENDENT_AMBULATORY_CARE_PROVIDER_SITE_OTHER): Payer: Commercial Managed Care - HMO | Admitting: Ophthalmology

## 2014-09-07 DIAGNOSIS — E11339 Type 2 diabetes mellitus with moderate nonproliferative diabetic retinopathy without macular edema: Secondary | ICD-10-CM

## 2014-09-07 DIAGNOSIS — E11319 Type 2 diabetes mellitus with unspecified diabetic retinopathy without macular edema: Secondary | ICD-10-CM

## 2014-09-07 DIAGNOSIS — E11329 Type 2 diabetes mellitus with mild nonproliferative diabetic retinopathy without macular edema: Secondary | ICD-10-CM | POA: Diagnosis not present

## 2014-10-04 ENCOUNTER — Other Ambulatory Visit: Payer: Self-pay | Admitting: Internal Medicine

## 2014-10-04 ENCOUNTER — Encounter: Payer: Self-pay | Admitting: *Deleted

## 2014-10-10 DIAGNOSIS — H35033 Hypertensive retinopathy, bilateral: Secondary | ICD-10-CM | POA: Diagnosis not present

## 2014-10-10 DIAGNOSIS — E240 Pituitary-dependent Cushing's disease: Secondary | ICD-10-CM | POA: Diagnosis not present

## 2014-10-10 DIAGNOSIS — H2513 Age-related nuclear cataract, bilateral: Secondary | ICD-10-CM | POA: Diagnosis not present

## 2014-10-10 DIAGNOSIS — E11339 Type 2 diabetes mellitus with moderate nonproliferative diabetic retinopathy without macular edema: Secondary | ICD-10-CM | POA: Diagnosis not present

## 2014-10-10 DIAGNOSIS — H40033 Anatomical narrow angle, bilateral: Secondary | ICD-10-CM | POA: Diagnosis not present

## 2014-10-10 DIAGNOSIS — H25013 Cortical age-related cataract, bilateral: Secondary | ICD-10-CM | POA: Diagnosis not present

## 2014-10-18 ENCOUNTER — Ambulatory Visit (INDEPENDENT_AMBULATORY_CARE_PROVIDER_SITE_OTHER): Payer: Commercial Managed Care - HMO | Admitting: Internal Medicine

## 2014-10-18 ENCOUNTER — Encounter: Payer: Self-pay | Admitting: Internal Medicine

## 2014-10-18 DIAGNOSIS — E1165 Type 2 diabetes mellitus with hyperglycemia: Secondary | ICD-10-CM | POA: Diagnosis not present

## 2014-10-18 DIAGNOSIS — J069 Acute upper respiratory infection, unspecified: Secondary | ICD-10-CM | POA: Diagnosis not present

## 2014-10-18 DIAGNOSIS — E118 Type 2 diabetes mellitus with unspecified complications: Secondary | ICD-10-CM

## 2014-10-18 DIAGNOSIS — R0902 Hypoxemia: Secondary | ICD-10-CM

## 2014-10-18 DIAGNOSIS — Z0000 Encounter for general adult medical examination without abnormal findings: Secondary | ICD-10-CM

## 2014-10-18 DIAGNOSIS — I10 Essential (primary) hypertension: Secondary | ICD-10-CM | POA: Diagnosis not present

## 2014-10-18 LAB — POCT GLYCOSYLATED HEMOGLOBIN (HGB A1C): HEMOGLOBIN A1C: 8.7

## 2014-10-18 LAB — GLUCOSE, CAPILLARY: GLUCOSE-CAPILLARY: 123 mg/dL — AB (ref 65–99)

## 2014-10-18 MED ORDER — LOSARTAN POTASSIUM 50 MG PO TABS: 50.0000 mg | ORAL_TABLET | Freq: Every day | ORAL | Status: DC

## 2014-10-18 MED ORDER — ACCU-CHEK FASTCLIX LANCETS MISC: Status: DC

## 2014-10-18 MED ORDER — OMEPRAZOLE 20 MG PO CPDR: 20.0000 mg | DELAYED_RELEASE_CAPSULE | Freq: Every day | ORAL | Status: DC

## 2014-10-18 MED ORDER — FUROSEMIDE 40 MG PO TABS: 40.0000 mg | ORAL_TABLET | Freq: Every day | ORAL | Status: DC

## 2014-10-18 MED ORDER — INSULIN DETEMIR 100 UNIT/ML ~~LOC~~ SOLN: SUBCUTANEOUS | Status: DC

## 2014-10-18 MED ORDER — AMITRIPTYLINE HCL 50 MG PO TABS: ORAL_TABLET | ORAL | Status: DC

## 2014-10-18 NOTE — Assessment & Plan Note (Signed)
-   Patient still with mild hypoxia on ambulating down to 85% - She remains asymptomatic - Will c/w O2 with ambulation

## 2014-10-18 NOTE — Assessment & Plan Note (Signed)
-   Patient with left ear pain and hoarse voice - No fevers, no cough, no congestion - possibly viral infection - No pharyngeal exudates on exam, normal TM visualized bilaterally, no discharge - Will monitor for now - If persists will consider ENT referral

## 2014-10-18 NOTE — Progress Notes (Signed)
   Subjective:    Patient ID: Kathleen [name], female    DOB: [birth year], 64 y.o.   MRN: [id number]  HPI Patient seen and examined. Patient presents today for routine follow up of her DM and HTN.  She states she is exercising every day now and feels better. She also states she has been eating healthier but has noticed huge variations in her blood sugar. She denies any hypoglycemic events.  She also notes a hoarse voice and left ear pain over the past week but denies fevers/chills. No sore throat, no odynophagia, no tender lymphadenopathy, no cough, no nasal congestion. No post nasal drip.   Review of Systems  Constitutional: Negative.   HENT: Positive for ear pain and voice change. Negative for congestion, drooling, ear discharge, facial swelling, hearing loss, nosebleeds, postnasal drip, rhinorrhea, sinus pressure, sneezing and sore throat.   Eyes: Negative.   Respiratory: Negative.   Cardiovascular: Negative.   Gastrointestinal: Negative.   Genitourinary: Negative.   Musculoskeletal: Negative.   Skin: Negative.   Neurological: Negative.   Psychiatric/Behavioral: Negative.        Objective:   Physical Exam  Constitutional: She is oriented to person, place, and time. She appears well-developed and well-nourished.  HENT:  Head: Normocephalic and atraumatic.  Eyes: Conjunctivae are normal.  Neck: Normal range of motion.  Cardiovascular: Normal rate, regular rhythm and normal heart sounds.   Pulmonary/Chest: Effort normal. No respiratory distress. She has no wheezes.  Abdominal: Soft. Bowel sounds are normal. She exhibits no distension. There is no tenderness.  Musculoskeletal: Normal range of motion. She exhibits no edema.  Neurological: She is alert and oriented to person, place, and time.  Skin: Skin is warm. No rash noted. No erythema.  Psychiatric: She has a normal mood and affect. Her behavior is normal.          Assessment & Plan:  Please see problem based charting  for assessment and plan:

## 2014-10-18 NOTE — Assessment & Plan Note (Signed)
-   discussed pap smear with patient and will attempt to get her into our next pap clinic

## 2014-10-18 NOTE — Assessment & Plan Note (Signed)
Lab Results  Component Value Date   HGBA1C 8.7 10/18/2014   HGBA1C 8.0 06/07/2014   HGBA1C 7.0 01/25/2014     Assessment: Diabetes control:  poorly controlled Progress toward A1C goal:   deteriorated Comments: patient is compliant with meds but needs to improve diet  Plan: Medications:  continue current medications Home glucose monitoring: Frequency:   Timing:   Instruction/counseling given: reminded to bring blood glucose meter & log to each visit, reminded to bring medications to each visit, discussed the need for weight loss and discussed diet Educational resources provided: brochure (denies) Self management tools provided: copy of home glucose meter download Other plans: Extensive discussion had with patient about her diet. She does exercise daily now. She wants to attempt diet and exercise before changing her medications. Will follow up in 3 months

## 2014-10-18 NOTE — Patient Instructions (Signed)
- It was a pleasure seeing you today - Your A1C has worsened from last time. Please follow up in 3 months - Continue with the diet and exercise. Try and avoid eating the flavored yoghurts - You are due for a PAP smear. We will try and set you up with a PAP clinic - Please use the oxygen if you plan to walk long distances   Diabetes Mellitus and Food It is important for you to manage your blood sugar (glucose) level. Your blood glucose level can be greatly affected by what you eat. Eating healthier foods in the appropriate amounts throughout the day at about the same time each day will help you control your blood glucose level. It can also help slow or prevent worsening of your diabetes mellitus. Healthy eating may even help you improve the level of your blood pressure and reach or maintain a healthy weight.  HOW CAN FOOD AFFECT ME? Carbohydrates Carbohydrates affect your blood glucose level more than any other type of food. Your dietitian will help you determine how many carbohydrates to eat at each meal and teach you how to count carbohydrates. Counting carbohydrates is important to keep your blood glucose at a healthy level, especially if you are using insulin or taking certain medicines for diabetes mellitus. Alcohol Alcohol can cause sudden decreases in blood glucose (hypoglycemia), especially if you use insulin or take certain medicines for diabetes mellitus. Hypoglycemia can be a life-threatening condition. Symptoms of hypoglycemia (sleepiness, dizziness, and disorientation) are similar to symptoms of having too much alcohol.  If your health care provider has given you approval to drink alcohol, do so in moderation and use the following guidelines:  Women should not have more than one drink per day, and men should not have more than two drinks per day. One drink is equal to:  12 oz of beer.  5 oz of wine.  1 oz of hard liquor.  Do not drink on an empty stomach.  Keep yourself  hydrated. Have water, diet soda, or unsweetened iced tea.  Regular soda, juice, and other mixers might contain a lot of carbohydrates and should be counted. WHAT FOODS ARE NOT RECOMMENDED? As you make food choices, it is important to remember that all foods are not the same. Some foods have fewer nutrients per serving than other foods, even though they might have the same number of calories or carbohydrates. It is difficult to get your body what it needs when you eat foods with fewer nutrients. Examples of foods that you should avoid that are high in calories and carbohydrates but low in nutrients include:  Trans fats (most processed foods list trans fats on the Nutrition Facts label).  Regular soda.  Juice.  Candy.  Sweets, such as cake, pie, doughnuts, and cookies.  Fried foods. WHAT FOODS CAN I EAT? Have nutrient-rich foods, which will nourish your body and keep you healthy. The food you should eat also will depend on several factors, including:  The calories you need.  The medicines you take.  Your weight.  Your blood glucose level.  Your blood pressure level.  Your cholesterol level. You also should eat a variety of foods, including:  Protein, such as meat, poultry, fish, tofu, nuts, and seeds (lean animal proteins are best).  Fruits.  Vegetables.  Dairy products, such as milk, cheese, and yogurt (low fat is best).  Breads, grains, pasta, cereal, rice, and beans.  Fats such as olive oil, trans fat-free margarine, canola oil, avocado, and  olives. DOES EVERYONE WITH DIABETES MELLITUS HAVE THE SAME MEAL PLAN? Because every person with diabetes mellitus is different, there is not one meal plan that works for everyone. It is very important that you meet with a dietitian who will help you create a meal plan that is just right for you. Document Released: 01/08/2005 Document Revised: 04/18/2013 Document Reviewed: 03/10/2013 [facility] Patient Information 2015 [place]. This information is not intended to replace advice given to you by your health care provider. Make sure you discuss any questions you have with your health care provider.

## 2014-10-18 NOTE — Progress Notes (Signed)
Patient ambulated in Clinic  Oxygen levels ranged from 94 -85.  Patient was allowed to rest at intervals and levels returned to the 90's.  Sander Nephew, RN 10/18/2014 11:12 AM

## 2014-10-18 NOTE — Assessment & Plan Note (Signed)
BP Readings from Last 3 Encounters:  10/18/14 121/69  07/11/14 135/51  06/07/14 164/70    Lab Results  Component Value Date   NA 143 06/07/2014   K 4.7 06/07/2014   CREATININE 0.94 06/07/2014    Assessment: Blood pressure control:  well controlled Progress toward BP goal:   at goal Comments: patient is compliant with meds  Plan: Medications:  continue current medications Educational resources provided: brochure (denies) Self management tools provided:   Other plans: Will monitor BP. Will consider increasing losartan at next visit if she has persistent proteinuria

## 2014-10-24 DIAGNOSIS — H16149 Punctate keratitis, unspecified eye: Secondary | ICD-10-CM | POA: Diagnosis not present

## 2014-11-11 ENCOUNTER — Other Ambulatory Visit: Payer: Self-pay | Admitting: Internal Medicine

## 2014-11-20 DIAGNOSIS — H2512 Age-related nuclear cataract, left eye: Secondary | ICD-10-CM | POA: Diagnosis not present

## 2014-12-12 ENCOUNTER — Other Ambulatory Visit: Payer: Self-pay | Admitting: Internal Medicine

## 2014-12-12 ENCOUNTER — Encounter: Payer: Self-pay | Admitting: Podiatry

## 2014-12-12 ENCOUNTER — Ambulatory Visit (INDEPENDENT_AMBULATORY_CARE_PROVIDER_SITE_OTHER): Payer: Commercial Managed Care - HMO | Admitting: Podiatry

## 2014-12-12 DIAGNOSIS — B351 Tinea unguium: Secondary | ICD-10-CM | POA: Diagnosis not present

## 2014-12-12 DIAGNOSIS — M79676 Pain in unspecified toe(s): Secondary | ICD-10-CM

## 2014-12-12 NOTE — Patient Instructions (Signed)
Diabetes and Foot Care Diabetes may cause you to have problems because of poor blood supply (circulation) to your feet and legs. This may cause the skin on your feet to become thinner, break easier, and heal more slowly. Your skin may become dry, and the skin may peel and crack. You may also have nerve damage in your legs and feet causing decreased feeling in them. You may not notice minor injuries to your feet that could lead to infections or more serious problems. Taking care of your feet is one of the most important things you can do for yourself.  HOME CARE INSTRUCTIONS  Wear shoes at all times, even in the house. Do not go barefoot. Bare feet are easily injured.  Check your feet daily for blisters, cuts, and redness. If you cannot see the bottom of your feet, use a mirror or ask someone for help.  Wash your feet with warm water (do not use hot water) and mild soap. Then pat your feet and the areas between your toes until they are completely dry. Do not soak your feet as this can dry your skin.  Apply a moisturizing lotion or petroleum jelly (that does not contain alcohol and is unscented) to the skin on your feet and to dry, brittle toenails. Do not apply lotion between your toes.  Trim your toenails straight across. Do not dig under them or around the cuticle. File the edges of your nails with an emery board or nail file.  Do not cut corns or calluses or try to remove them with medicine.  Wear clean socks or stockings every day. Make sure they are not too tight. Do not wear knee-high stockings since they may decrease blood flow to your legs.  Wear shoes that fit properly and have enough cushioning. To break in new shoes, wear them for just a few hours a day. This prevents you from injuring your feet. Always look in your shoes before you put them on to be sure there are no objects inside.  Do not cross your legs. This may decrease the blood flow to your feet.  If you find a minor scrape,  cut, or break in the skin on your feet, keep it and the skin around it clean and dry. These areas may be cleansed with mild soap and water. Do not cleanse the area with peroxide, alcohol, or iodine.  When you remove an adhesive bandage, be sure not to damage the skin around it.  If you have a wound, look at it several times a day to make sure it is healing.  Do not use heating pads or hot water bottles. They may burn your skin. If you have lost feeling in your feet or legs, you may not know it is happening until it is too late.  Make sure your health care provider performs a complete foot exam at least annually or more often if you have foot problems. Report any cuts, sores, or bruises to your health care provider immediately. SEEK MEDICAL CARE IF:   You have an injury that is not healing.  You have cuts or breaks in the skin.  You have an ingrown nail.  You notice redness on your legs or feet.  You feel burning or tingling in your legs or feet.  You have pain or cramps in your legs and feet.  Your legs or feet are numb.  Your feet always feel cold. SEEK IMMEDIATE MEDICAL CARE IF:   There is increasing redness,   swelling, or pain in or around a wound.  There is a red line that goes up your leg.  Pus is coming from a wound.  You develop a fever or as directed by your health care provider.  You notice a bad smell coming from an ulcer or wound. Document Released: 04/10/2000 Document Revised: 12/14/2012 Document Reviewed: 09/20/2012 [employer] Patient Information 2015 [employer]. This information is not intended to replace advice given to you by your health care provider. Make sure you discuss any questions you have with your health care provider.  

## 2014-12-13 NOTE — Progress Notes (Signed)
Patient ID: Kathleen [name], female   DOB: 04-Aug-1950, 65 y.o.   MRN: [id number]  Subjective: This patient presents for scheduled visit again complaining of painful toenails walking wearing shoes and request toenail debridement  Objective: The toenails are elongated, discolored, incurvated, hypertrophic and tender to direct palpation 6-10  Assessment: Symptomatic onychomycoses 6-10 Diabetic  Plan: Debridement toenails 10 mechanically and electrically without any bleeding  Reappoint 3 month

## 2014-12-19 DIAGNOSIS — H2511 Age-related nuclear cataract, right eye: Secondary | ICD-10-CM | POA: Diagnosis not present

## 2014-12-19 DIAGNOSIS — H25011 Cortical age-related cataract, right eye: Secondary | ICD-10-CM | POA: Diagnosis not present

## 2015-01-04 ENCOUNTER — Other Ambulatory Visit: Payer: Self-pay | Admitting: Internal Medicine

## 2015-01-10 ENCOUNTER — Inpatient Hospital Stay (HOSPITAL_COMMUNITY)
Admission: EM | Admit: 2015-01-10 | Discharge: 2015-01-18 | DRG: 292 | Disposition: A | Discharge status: Home Health | Payer: Commercial Managed Care - HMO | Attending: Internal Medicine | Admitting: Internal Medicine

## 2015-01-10 ENCOUNTER — Encounter (HOSPITAL_COMMUNITY): Payer: Self-pay

## 2015-01-10 ENCOUNTER — Emergency Department (HOSPITAL_COMMUNITY): Payer: Commercial Managed Care - HMO

## 2015-01-10 ENCOUNTER — Inpatient Hospital Stay (HOSPITAL_COMMUNITY): Payer: Commercial Managed Care - HMO

## 2015-01-10 DIAGNOSIS — Z23 Encounter for immunization: Secondary | ICD-10-CM

## 2015-01-10 DIAGNOSIS — E873 Alkalosis: Secondary | ICD-10-CM | POA: Diagnosis not present

## 2015-01-10 DIAGNOSIS — R0902 Hypoxemia: Secondary | ICD-10-CM | POA: Diagnosis present

## 2015-01-10 DIAGNOSIS — R0600 Dyspnea, unspecified: Secondary | ICD-10-CM | POA: Diagnosis not present

## 2015-01-10 DIAGNOSIS — J984 Other disorders of lung: Secondary | ICD-10-CM | POA: Diagnosis present

## 2015-01-10 DIAGNOSIS — Z823 Family history of stroke: Secondary | ICD-10-CM | POA: Diagnosis not present

## 2015-01-10 DIAGNOSIS — I441 Atrioventricular block, second degree: Secondary | ICD-10-CM | POA: Diagnosis not present

## 2015-01-10 DIAGNOSIS — R0602 Shortness of breath: Secondary | ICD-10-CM | POA: Diagnosis not present

## 2015-01-10 DIAGNOSIS — R079 Chest pain, unspecified: Secondary | ICD-10-CM

## 2015-01-10 DIAGNOSIS — E785 Hyperlipidemia, unspecified: Secondary | ICD-10-CM | POA: Diagnosis present

## 2015-01-10 DIAGNOSIS — I5032 Chronic diastolic (congestive) heart failure: Secondary | ICD-10-CM | POA: Diagnosis not present

## 2015-01-10 DIAGNOSIS — E1142 Type 2 diabetes mellitus with diabetic polyneuropathy: Secondary | ICD-10-CM | POA: Diagnosis not present

## 2015-01-10 DIAGNOSIS — K580 Irritable bowel syndrome with diarrhea: Secondary | ICD-10-CM | POA: Diagnosis present

## 2015-01-10 DIAGNOSIS — Z881 Allergy status to other antibiotic agents status: Secondary | ICD-10-CM | POA: Diagnosis not present

## 2015-01-10 DIAGNOSIS — Z9981 Dependence on supplemental oxygen: Secondary | ICD-10-CM | POA: Diagnosis not present

## 2015-01-10 DIAGNOSIS — Z9111 Patient's noncompliance with dietary regimen: Secondary | ICD-10-CM | POA: Diagnosis present

## 2015-01-10 DIAGNOSIS — E662 Morbid (severe) obesity with alveolar hypoventilation: Secondary | ICD-10-CM | POA: Diagnosis present

## 2015-01-10 DIAGNOSIS — N183 Chronic kidney disease, stage 3 unspecified: Secondary | ICD-10-CM | POA: Insufficient documentation

## 2015-01-10 DIAGNOSIS — M1990 Unspecified osteoarthritis, unspecified site: Secondary | ICD-10-CM | POA: Diagnosis present

## 2015-01-10 DIAGNOSIS — E875 Hyperkalemia: Secondary | ICD-10-CM | POA: Diagnosis present

## 2015-01-10 DIAGNOSIS — I509 Heart failure, unspecified: Secondary | ICD-10-CM

## 2015-01-10 DIAGNOSIS — Z833 Family history of diabetes mellitus: Secondary | ICD-10-CM

## 2015-01-10 DIAGNOSIS — I5031 Acute diastolic (congestive) heart failure: Secondary | ICD-10-CM | POA: Diagnosis not present

## 2015-01-10 DIAGNOSIS — R0789 Other chest pain: Secondary | ICD-10-CM | POA: Diagnosis not present

## 2015-01-10 DIAGNOSIS — Z794 Long term (current) use of insulin: Secondary | ICD-10-CM

## 2015-01-10 DIAGNOSIS — E8770 Fluid overload, unspecified: Secondary | ICD-10-CM | POA: Insufficient documentation

## 2015-01-10 DIAGNOSIS — Z6841 Body Mass Index (BMI) 40.0 and over, adult: Secondary | ICD-10-CM

## 2015-01-10 DIAGNOSIS — R609 Edema, unspecified: Secondary | ICD-10-CM | POA: Diagnosis present

## 2015-01-10 DIAGNOSIS — R0981 Nasal congestion: Secondary | ICD-10-CM | POA: Diagnosis present

## 2015-01-10 DIAGNOSIS — I442 Atrioventricular block, complete: Secondary | ICD-10-CM | POA: Diagnosis not present

## 2015-01-10 DIAGNOSIS — R001 Bradycardia, unspecified: Secondary | ICD-10-CM | POA: Diagnosis present

## 2015-01-10 DIAGNOSIS — I2699 Other pulmonary embolism without acute cor pulmonale: Secondary | ICD-10-CM

## 2015-01-10 DIAGNOSIS — I10 Essential (primary) hypertension: Secondary | ICD-10-CM | POA: Diagnosis present

## 2015-01-10 DIAGNOSIS — I459 Conduction disorder, unspecified: Secondary | ICD-10-CM | POA: Diagnosis not present

## 2015-01-10 DIAGNOSIS — E118 Type 2 diabetes mellitus with unspecified complications: Secondary | ICD-10-CM

## 2015-01-10 DIAGNOSIS — I5033 Acute on chronic diastolic (congestive) heart failure: Principal | ICD-10-CM | POA: Diagnosis present

## 2015-01-10 DIAGNOSIS — R1013 Epigastric pain: Secondary | ICD-10-CM | POA: Diagnosis not present

## 2015-01-10 DIAGNOSIS — N179 Acute kidney failure, unspecified: Secondary | ICD-10-CM | POA: Diagnosis present

## 2015-01-10 DIAGNOSIS — R7989 Other specified abnormal findings of blood chemistry: Secondary | ICD-10-CM | POA: Diagnosis not present

## 2015-01-10 DIAGNOSIS — I5022 Chronic systolic (congestive) heart failure: Secondary | ICD-10-CM

## 2015-01-10 HISTORY — DX: Headache, unspecified: R51.9

## 2015-01-10 HISTORY — DX: Reserved for inherently not codable concepts without codable children: IMO0001

## 2015-01-10 HISTORY — DX: Essential (primary) hypertension: I10

## 2015-01-10 HISTORY — DX: Gastro-esophageal reflux disease without esophagitis: K21.9

## 2015-01-10 HISTORY — DX: Chronic diastolic (congestive) heart failure: I50.32

## 2015-01-10 HISTORY — DX: Headache: R51

## 2015-01-10 LAB — CBC WITH DIFFERENTIAL/PLATELET
Basophils Absolute: 0 10*3/uL (ref 0.0–0.1)
Basophils Relative: 0 %
EOS ABS: 0.2 10*3/uL (ref 0.0–0.7)
Eosinophils Relative: 2 %
HEMATOCRIT: 36.1 % (ref 36.0–46.0)
HEMOGLOBIN: 11 g/dL — AB (ref 12.0–15.0)
LYMPHS ABS: 1.6 10*3/uL (ref 0.7–4.0)
Lymphocytes Relative: 22 %
MCH: 29 pg (ref 26.0–34.0)
MCHC: 30.5 g/dL (ref 30.0–36.0)
MCV: 95.3 fL (ref 78.0–100.0)
MONO ABS: 0.6 10*3/uL (ref 0.1–1.0)
MONOS PCT: 8 %
NEUTROS ABS: 5 10*3/uL (ref 1.7–7.7)
NEUTROS PCT: 68 %
Platelets: 149 10*3/uL — ABNORMAL LOW (ref 150–400)
RBC: 3.79 MIL/uL — ABNORMAL LOW (ref 3.87–5.11)
RDW: 16.7 % — ABNORMAL HIGH (ref 11.5–15.5)
WBC: 7.3 10*3/uL (ref 4.0–10.5)

## 2015-01-10 LAB — COMPREHENSIVE METABOLIC PANEL
ALK PHOS: 112 U/L (ref 38–126)
ALT: 9 U/L — ABNORMAL LOW (ref 14–54)
ANION GAP: 6 (ref 5–15)
AST: 17 U/L (ref 15–41)
Albumin: 3.1 g/dL — ABNORMAL LOW (ref 3.5–5.0)
BILIRUBIN TOTAL: 0.7 mg/dL (ref 0.3–1.2)
BUN: 45 mg/dL — ABNORMAL HIGH (ref 6–20)
CALCIUM: 9.3 mg/dL (ref 8.9–10.3)
CO2: 26 mmol/L (ref 22–32)
Chloride: 107 mmol/L (ref 101–111)
Creatinine, Ser: 1.9 mg/dL — ABNORMAL HIGH (ref 0.44–1.00)
GFR calc non Af Amer: 27 mL/min — ABNORMAL LOW (ref >60)
GFR, EST AFRICAN AMERICAN: 31 mL/min — AB (ref >60)
Glucose, Bld: 89 mg/dL (ref 65–99)
Potassium: 5.8 mmol/L — ABNORMAL HIGH (ref 3.5–5.1)
Sodium: 139 mmol/L (ref 135–145)
TOTAL PROTEIN: 6.2 g/dL — AB (ref 6.5–8.1)

## 2015-01-10 LAB — PHOSPHORUS: Phosphorus: 3.9 mg/dL (ref 2.5–4.6)

## 2015-01-10 LAB — I-STAT TROPONIN, ED: TROPONIN I, POC: 0.01 ng/mL (ref 0.00–0.08)

## 2015-01-10 LAB — D-DIMER, QUANTITATIVE: D-Dimer, Quant: 2.4 ug/mL-FEU — ABNORMAL HIGH (ref 0.00–0.48)

## 2015-01-10 LAB — BRAIN NATRIURETIC PEPTIDE: B Natriuretic Peptide: 313.7 pg/mL — ABNORMAL HIGH (ref 0.0–100.0)

## 2015-01-10 LAB — MAGNESIUM: MAGNESIUM: 2 mg/dL (ref 1.7–2.4)

## 2015-01-10 LAB — CBG MONITORING, ED: Glucose-Capillary: 71 mg/dL (ref 65–99)

## 2015-01-10 MED ORDER — ASPIRIN 81 MG PO CHEW
324.0000 mg | CHEWABLE_TABLET | Freq: Once | ORAL | Status: AC
  Administered 2015-01-10: 324 mg via ORAL
  Filled 2015-01-10: qty 4

## 2015-01-10 MED ORDER — SODIUM POLYSTYRENE SULFONATE 15 GM/60ML PO SUSP
30.0000 g | Freq: Once | ORAL | Status: AC
  Administered 2015-01-10: 30 g via ORAL
  Filled 2015-01-10: qty 120

## 2015-01-10 MED ORDER — FUROSEMIDE 10 MG/ML IJ SOLN
40.0000 mg | Freq: Two times a day (BID) | INTRAMUSCULAR | Status: DC
  Administered 2015-01-10 – 2015-01-11 (×3): 40 mg via INTRAVENOUS
  Filled 2015-01-10 (×3): qty 4

## 2015-01-10 MED ORDER — INSULIN ASPART 100 UNIT/ML ~~LOC~~ SOLN
0.0000 [IU] | Freq: Three times a day (TID) | SUBCUTANEOUS | Status: DC
  Administered 2015-01-11: 2 [IU] via SUBCUTANEOUS
  Administered 2015-01-11: 3 [IU] via SUBCUTANEOUS
  Administered 2015-01-12: 2 [IU] via SUBCUTANEOUS
  Administered 2015-01-12: 3 [IU] via SUBCUTANEOUS
  Administered 2015-01-12: 2 [IU] via SUBCUTANEOUS
  Administered 2015-01-13: 3 [IU] via SUBCUTANEOUS
  Administered 2015-01-13 – 2015-01-14 (×2): 2 [IU] via SUBCUTANEOUS
  Administered 2015-01-14: 3 [IU] via SUBCUTANEOUS
  Administered 2015-01-14 – 2015-01-16 (×3): 2 [IU] via SUBCUTANEOUS
  Administered 2015-01-17: 3 [IU] via SUBCUTANEOUS
  Administered 2015-01-17: 2 [IU] via SUBCUTANEOUS
  Administered 2015-01-18: 3 [IU] via SUBCUTANEOUS

## 2015-01-10 MED ORDER — INSULIN ASPART 100 UNIT/ML ~~LOC~~ SOLN
0.0000 [IU] | Freq: Every day | SUBCUTANEOUS | Status: DC
  Administered 2015-01-11: 2 [IU] via SUBCUTANEOUS

## 2015-01-10 NOTE — ED Notes (Signed)
Dr. Liu at bedside 

## 2015-01-10 NOTE — Consult Note (Signed)
Admit date: 01/10/2015 Referring Physician  Dr. Oleta Mouse Primary Physician  Dr. Murlean Caller Primary Cardiologist  Dr. Acie Fredrickson Reason for Consultation  CHF and bradycardia  HPI: This is a 64yo morbidly obese WF with a history of HTN, morbid obesity, type 2 DM, IBS, dyslipidemia, Obesity hypoventilation syndrome (on home O2 with no OSA by PSG but showed nocturnal hypoxemia), chronic abdominal pain , chronic diastolic CHF, Wenkebach AV block and history of atypical stabbing CP who presents to the ER with complaints of a 3 week history of increasing SOB and LE edema as well as increased abdominal girth.  She was evaluated for atypical CP and SOB in January 2015 at which time she was found to have [place] AV block that was difficult to assess due to very low voltage that was felt to be due to morbid obesity.  Her obesity prevented stress testing and plan at that time was not to pursue ischemic testing.  She was also diagnosed with acute diastolic CHF and was diuresed with IV Lasix and has been on PO lasix.  She denies any syncope but has been dizzy.  Echo 2014 showed normal LVF with dilated IVC.    Cardiology was asked to consult today due to evidence of acute CHF on exam as well as bradycardia.  She denies any syncope.  She has had recent fever with chills and cough productive of white phlegm.  She has had some sharp chest pains similar to prior admission but some heaviness as well.  She has had worsening SOB.  In ER she was noted to have a normal Trop, mildly elevated BNP, CHF on chest xray, acute renal failure with creatinine 1.9 and K 5.8.     PMH:   Past Medical History  Diagnosis Date  . Irritable bowel syndrome   . Fecal occult blood test positive   . Degenerative joint disease of hand   . Obesity   . Post-menopausal bleeding   . Dyslipidemia   . Diabetes mellitus   . Inadequate material resources   . Postmenopausal      PSH:   Past Surgical History  Procedure Laterality Date  .  Cholecystectomy    . Other surgical history      right shoulder tendon repair 05/2011    Allergies:  Doxycycline Prior to Admit Meds:   (Not in a hospital admission) Fam HX:    Family History  Problem Relation Age of Onset  . Diabetes Mother   . Obesity Mother   . Stroke Father   . Diabetes Sister   . Colon cancer Neg Hx    Social HX:    Social History   Social History  . Marital Status: Divorced    Spouse Name: N/A  . Number of Children: 2  . Years of Education: N/A   Occupational History  . unemployed    Social History Main Topics  . Smoking status: Never Smoker   . Smokeless tobacco: Never Used  . Alcohol Use: No  . Drug Use: No  . Sexual Activity: Not Currently   Other Topics Concern  . Not on file   Social History Narrative   Single.   Divorced x2.   Has 2 children, by two different fathers.   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 May apply for disability/SSI given her pain in her hands which has made interviewing for jobs difficult.   Never Smoked  Alcohol use-no   Drug use-no   Regular exercise-yes      Financial assistance approved for 100% discount at [facility] and has [facility] card per [employer]   02/18/2010              ROS:  All 11 ROS were addressed and are negative except what is stated in the HPI  Physical Exam: Blood pressure 113/45, pulse 43, temperature 98.5 F (36.9 C), temperature source Oral, resp. rate 16, SpO2 100 %.    General: Well developed and morbidly obese  Head: Eyes PERRLA, No xanthomas.   Normal cephalic and atramatic  Lungs:   Crackles at bases bilaterally Heart:   HRRR S1 S2 Pulses are 2+ & equal.            No carotid bruit. No JVD.  No abdominal bruits. No femoral bruits. Abdomen: Bowel sounds are positive, abdomen tight without masses  Extremities:   No clubbing, cyanosis.  DP +1.  Marked LE edema 3+ Neuro: Alert and oriented X 3. Psych:   Good affect, responds appropriately    Labs:   Lab Results  Component Value Date   WBC 7.3 01/10/2015   HGB 11.0* 01/10/2015   HCT 36.1 01/10/2015   MCV 95.3 01/10/2015   PLT 149* 01/10/2015    Recent Labs Lab 01/10/15 1744  NA 139  K 5.8*  CL 107  CO2 26  BUN 45*  CREATININE 1.90*  CALCIUM 9.3  PROT 6.2*  BILITOT 0.7  ALKPHOS 112  ALT 9*  AST 17  GLUCOSE 89   No results found for: PTT Lab Results  Component Value Date   INR 1.2 11/05/2007   Lab Results  Component Value Date   TROPONINI <0.30 05/23/2013     Lab Results  Component Value Date   CHOL 141 06/07/2014   CHOL 119 09/29/2012   CHOL 115 09/25/2011   Lab Results  Component Value Date   HDL 40 06/07/2014   HDL 36* 09/29/2012   HDL 32* 09/25/2011   Lab Results  Component Value Date   LDLCALC 85 06/07/2014   LDLCALC 64 09/29/2012   LDLCALC 69 09/25/2011   Lab Results  Component Value Date   TRIG 82 06/07/2014   TRIG 96 09/29/2012   TRIG 69 09/25/2011   Lab Results  Component Value Date   CHOLHDL 3.5 06/07/2014   CHOLHDL 3.3 09/29/2012   CHOLHDL 3.6 09/25/2011   No results found for: LDLDIRECT    Radiology:  Dg Chest 2 View  01/10/2015   CLINICAL DATA:  Right-sided chest pain and dyspnea for 1 week  EXAM: CHEST  2 VIEW  COMPARISON:  05/22/2013  FINDINGS: Moderate cardiomegaly. Central and basilar pulmonary edema. No pneumothorax.  IMPRESSION: CHF with central and basilar edema.   Electronically Signed   By: Marybelle Killings M.D.   On: 01/10/2015 18:10    EKG:  NSR with low voltage and ? wenkebach block with HRs in the 40's  ASSESSMENT/PLAN: 1.  Acute diastolic CHF of ? Etiology.  Most likely multifactorial from dietary indiscretion, chronic nocturnal hypoxemia, bradycardia. 2.  Heart block - EKG is difficult to interpret. There is evidence of PR lengthening at times with dropped beats that appears to be Wenkebach.  Her potassium is 5.8 which may be contributing.  Will give Kayexalate.   She is hemodynamically stable so will continue to monitor.  She has a history of this on last admission with a very similar EKG.  She is  not on any AV nodal blocking agents.   3.  Low voltage QRS - this was present on last admission and most likely due to morbid obesity and not pericardial effusion.  Will get echo in the am. 4.  Acute renal failure - ? Secondary to acute CHF vs. Diabetic nephropathy - will follow closely with diuresis. 5.  DM on metformin - this will need to be held given ARF - treatment per IM 6.  Morbid obesity with obesity hypoventilation syndrome and nighttime hypoxemia on home O2 - no OSA on PSG.  Needs to be monitor to make sure hypoxemia resolved on O2 at night. 7.  HTN - controlled 8.  Atypical CP that may be related to #1 - check D-Dimer and cycle trops.  Sueanne Margarita, MD  01/10/2015  8:28 PM

## 2015-01-10 NOTE — ED Notes (Signed)
Kathleen [name] called back, decided to delay testing for NM. Awaiting [name]'s arrival to relay. Will clear to have diet with [name] on arrival. cbg was 39, and was NPO for NM scan.

## 2015-01-10 NOTE — ED Notes (Signed)
Spoke to [facility], advised to perform NM study tonight to see if patient is able to complete. Spoke to [facility], radiologist to report plan is to be conducted. He says he is already on the way, ETA of 20 minutes.

## 2015-01-10 NOTE — ED Notes (Signed)
Spoke to Mickel Baas, NP in regard to plan of care. Patient may travel upstairs, echo and nm pulmonary may be done in the morning.

## 2015-01-10 NOTE — ED Notes (Signed)
Spoke with radiology team, provided background for patient's nm pulmonary study, rule out for PE.

## 2015-01-10 NOTE — ED Provider Notes (Addendum)
CSN: [id number]     Arrival date & time 01/10/15  1715 History   First MD Initiated Contact with Patient 01/10/15 1719     Chief Complaint  Patient presents with  . Shortness of Breath     (Consider location/radiation/quality/duration/timing/severity/associated sxs/prior Treatment) HPI 64 year old female who presents with shortness of breath. History of diabetes and IBS. Reports no known cardiac history in the past. Reports that over 3 weeks. She has had progressively worsening dyspnea on exertion. Reports prior to that she was very active and exercises regularly. However she has been feeling more fatigued and short of breath with exercise. This is his progressed to shortness of breath and fatigue with just routine daily activities. Today she felt extremely short of breath with fatigue and chest pain at rest. During this period of time she has also had increasing weight gain and terms of abdominal girth and lower extremity edema. She is also feeling more orthopnea. Denies syncope, cough, fever/chills, nausea, vomiting, abdominal pain.   Past Medical History  Diagnosis Date  . Irritable bowel syndrome   . Fecal occult blood test positive   . Degenerative joint disease of hand   . Obesity   . Post-menopausal bleeding   . Dyslipidemia   . Diabetes mellitus   . Inadequate material resources   . Postmenopausal    Past Surgical History  Procedure Laterality Date  . Cholecystectomy    . Other surgical history      right shoulder tendon repair 05/2011   Family History  Problem Relation Age of Onset  . Diabetes Mother   . Obesity Mother   . Stroke Father   . Diabetes Sister   . Colon cancer Neg Hx    Social History  Substance Use Topics  . Smoking status: Never Smoker   . Smokeless tobacco: Never Used  . Alcohol Use: No   OB History    No data available     Review of Systems 10/14 systems reviewed and are negative other than those stated in the HPI   Allergies   Doxycycline  Home Medications   Prior to Admission medications   Medication Sig Start Date End Date Taking? Authorizing Provider  ACCU-CHEK FASTCLIX LANCETS MISC TEST THREE TIMES DAILY 10/18/14   Aldine Contes, MD  ACCU-CHEK SMARTVIEW test strip TEST BLOOD SUGAR THREE TIMES DAILY 01/04/15   Aldine Contes, MD  amitriptyline (ELAVIL) 50 MG tablet TAKE 1/2 TABLET BY MOUTH AT BEDTIME AS NEEDED FOR SLEEP 10/18/14   Nischal Narendra, MD  dicyclomine (BENTYL) 20 MG tablet TAKE 1 TABLET (20 MG TOTAL) BY MOUTH 3 (THREE) TIMES DAILY AS NEEDED FOR SPASMS. 07/11/14   Aldine Contes, MD  furosemide (LASIX) 40 MG tablet Take 1 tablet (40 mg total) by mouth daily. 10/18/14   Aldine Contes, MD  gabapentin (NEURONTIN) 300 MG capsule Take 1 capsule (300 mg total) by mouth 3 (three) times daily. 07/11/14   Nischal Narendra, MD  LEVEMIR 100 UNIT/ML injection INJECT 87 UNITS AT BEDTIME 12/12/14   Nischal Narendra, MD  losartan (COZAAR) 50 MG tablet Take 1 tablet (50 mg total) by mouth daily. 10/18/14   Nischal Narendra, MD  metFORMIN (GLUCOPHAGE) 1000 MG tablet TAKE 1 TABLET (1,000 MG TOTAL) BY MOUTH 2 (TWO) TIMES DAILY WITH A MEAL. 03/08/14   Aldine Contes, MD  NON FORMULARY Place 2 L into the nose daily. Wears with exertion and qhs    Historical Provider, MD  omeprazole (PRILOSEC) 20 MG capsule Take 1 capsule (20 mg  total) by mouth daily. 10/18/14   Nischal Narendra, MD   BP 148/125 mmHg  Pulse 46  Temp(Src) 97.8 F (36.6 C) (Oral)  Resp 14  SpO2 96% Physical Exam Physical Exam  Nursing note and vitals reviewed. Constitutional: Well developed, well nourished, non-toxic, and in no acute distress Head: Normocephalic and atraumatic.  Mouth/Throat: Oropharynx is clear and moist.  Neck: Normal range of motion. Neck supple.  Cardiovascular: Bradycardic rate and regular rhythm.  +2 pitting edema.  Pulmonary/Chest: Tachypnea. No accessory muscle usage. No conversational dyspnea. On 3L [place]. Scattered  wheezes throughout. Abdominal: Soft. Distended, obese. There is no tenderness. There is no rebound and no guarding.  Musculoskeletal: No deformities. Neurological: Alert, no facial droop, fluent speech, moves all extremities symmetrically Skin: Skin is warm and dry.  Psychiatric: Cooperative  ED Course  Procedures (including critical care time) Labs Review Labs Reviewed  CBC WITH DIFFERENTIAL/PLATELET - Abnormal; Notable for the following:    RBC 3.79 (*)    Hemoglobin 11.0 (*)    RDW 16.7 (*)    Platelets 149 (*)    All other components within normal limits  COMPREHENSIVE METABOLIC PANEL - Abnormal; Notable for the following:    Potassium 5.8 (*)    BUN 45 (*)    Creatinine, Ser 1.90 (*)    Total Protein 6.2 (*)    Albumin 3.1 (*)    ALT 9 (*)    GFR calc non Af Amer 27 (*)    GFR calc Af Amer 31 (*)    All other components within normal limits  BRAIN NATRIURETIC PEPTIDE - Abnormal; Notable for the following:    B Natriuretic Peptide 313.7 (*)    All other components within normal limits  MAGNESIUM  PHOSPHORUS  I-STAT TROPOININ, ED    Imaging Review Dg Chest 2 View  01/10/2015   CLINICAL DATA:  Right-sided chest pain and dyspnea for 1 week  EXAM: CHEST  2 VIEW  COMPARISON:  05/22/2013  FINDINGS: Moderate cardiomegaly. Central and basilar pulmonary edema. No pneumothorax.  IMPRESSION: CHF with central and basilar edema.   Electronically Signed   By: Marybelle Killings M.D.   On: 01/10/2015 18:10   I have personally reviewed and evaluated these images and lab results as part of my medical decision-making.   EKG Interpretation   Date/Time:  Thursday January 10 2015 17:32:21 EDT Ventricular Rate:  78 PR Interval:    QRS Duration: 75 QT Interval:  271 QTC Calculation: 308 R Axis:   -6 Text Interpretation:  Bradycardia  with complete heart block Junctional  escape complexes or rhythm New since prior EKG Confirmed by Jazzlyn Huizenga MD, Blondine Hottel  [phone]) on 01/10/2015 6:05:39 PM       MDM   Final diagnoses:  Acute heart failure, unspecified heart failure type  Hypervolemia, unspecified hypervolemia type  Complete heart block    64 year old female with history of diabetes who presents with progressively worsening shortness of breath. She has a 3 L oxygen requirement in the emergency department and noted to be bradycardic with a heart rate in the 30s to 50s. She is normotensive and appears to have normal mental status with good peripheral perfusion. She appears fluid overloaded on exam with increased abdominal girth, and pitting lower extremity edema. Wheezes on lung exam, but this is felt to be more consistent with likely pulmonary edema. EKG reviewed, and concerning for possible complete heart block. Discussed with cardiology on-call, who agreed with this finding. Troponin negative. Mildly elevated BNP, but  presentation consistent with new onset heart failure. Chest x-ray showing pulmonary vascular congestion. Mild hyperkalemia with AKI, but did not feel this degree of hyperkalemia would contribute fully to heart block. Given full dose ASA as well as symptoms may also be consistent with unstable angina. Will defer heparinization to cardiology. Cardiology recommendations pending currently.   Patient admitted to internal medicine for management of acute CHF exacerbation with heart block.  CRITICAL CARE Performed by: Forde Dandy   Total critical care time: 30 minutes  Critical care time was exclusive of separately billable procedures and treating other patients.  Critical care was necessary to treat or prevent imminent or life-threatening deterioration.  Critical care was time spent personally by me on the following activities: development of treatment plan with patient and/or surrogate as well as nursing, discussions with consultants, evaluation of patient's response to treatment, examination of patient, obtaining history from patient or surrogate, ordering and performing  treatments and interventions, ordering and review of laboratory studies, ordering and review of radiographic studies, pulse oximetry and re-evaluation of patient's condition.     Forde Dandy, MD 01/11/15 0022  Forde Dandy, MD 01/11/15 [phone]

## 2015-01-10 NOTE — ED Notes (Signed)
cbg 71.

## 2015-01-10 NOTE — H&P (Signed)
Date: 01/10/2015               Patient Name:  Kathleen [name] MRN: [id number]  DOB: 26-Aug-1950 Age / Sex: 64 y.o., female   PCP: Aldine Contes, MD         Medical Service: Internal Medicine Teaching Service         Attending Physician: Dr. Forde Dandy, MD    First Contact: Dr. Benjamine Mola Pager: [phone]  Second Contact: Dr. Genene Churn Pager: [phone]       After Hours (After 5p/  First Contact Pager: [phone]  weekends / holidays): Second Contact Pager: [phone]   Chief Complaint: SOB, weight gain, LE swelling  History of Present Illness: [name] is a 64 yo woman with morbid obesity, DMII, HTN, HLD, IBS-diarrhea, OHS on 2L [place], and Type I 2nd degree heart block, and h/o dCHF exacerbation, presenting with 2-3 weeks of worsening SOB, weight gain, and LE edema.  She reports the inability to lie flat without becoming SOB, which has been chronic for years.  She sleeps with 2 pillows.  She cannot walk 50 feet or one flight of stairs without becoming SOB (baseline: 3 flights).  She reports worsening LE edema and weight gain (she is not sure how much).  She reports abdominal fullness to the point that she becomes increasingly SOB with sitting up.  She endorses cough for the last week, productive of clear sputum, worse at night.  She endorses compliance to her Lasix 40 mg daily.  She denies the use of salt, and reports drinking 5-6 bottles of water/day.  For the last 2-3 days, she has ben having right sided, squeezing, chest pain that does not radiate.  The pain is reproducible with palpation.  She has no h/o angina or MI.  She endorses subjective fevers, chills, and muscle/joint pains.  She endorses chronic diarrhea 2/2 IBS.  She denies N/V or dysuria.  She does not smoke or drink alcohol.  She was admitted in Jan 2015 with similar symptoms, thought to be due to [facility] exacerbation.  Last echo was 2014, showing EF [id number] and no diastolic dysfunction.  However, given her obesity, her AV block, and her response to  Lasix, symptoms were thought to be 2/2 dCHF exacerbation.  She did not undergo stress test 2/2 her obesity, and is not a candidate for beta blockers 2/2 her AV block.  She has not had a repeat echo since that time.    She underwent sleep study to evaluate for OSA, which not diagnostic, but demonstrated nocturnal hypoxemia.  PFTs June 2015 demonstrated restrictive lung disease.     Meds: Current Facility-Administered Medications  Medication Dose Route Frequency Provider Last Rate Last Dose  . [START ON 01/11/2015] insulin aspart (novoLOG) injection 0-15 Units  0-15 Units Subcutaneous TID WC Alexa Sherral Hammers, MD      . insulin aspart (novoLOG) injection 0-5 Units  0-5 Units Subcutaneous QHS Alexa Sherral Hammers, MD   Stopped at 01/10/15 2139   Current Outpatient Prescriptions  Medication Sig Dispense Refill  . amitriptyline (ELAVIL) 50 MG tablet Take 25 mg by mouth at bedtime.    Marland Kitchen aspirin 81 MG tablet Take 81 mg by mouth daily.    Marland Kitchen dicyclomine (BENTYL) 20 MG tablet TAKE 1 TABLET (20 MG TOTAL) BY MOUTH 3 (THREE) TIMES DAILY AS NEEDED FOR SPASMS. 90 tablet 1  . furosemide (LASIX) 40 MG tablet Take 1 tablet (40 mg total) by mouth daily. 90 tablet 1  .  gabapentin (NEURONTIN) 300 MG capsule Take 1 capsule (300 mg total) by mouth 3 (three) times daily. (Patient taking differently: Take 300 mg by mouth 3 (three) times daily. Three at bedtime) 270 capsule 2  . LEVEMIR 100 UNIT/ML injection INJECT 87 UNITS AT BEDTIME 30 mL 5  . losartan (COZAAR) 50 MG tablet Take 1 tablet (50 mg total) by mouth daily. 90 tablet 3  . metFORMIN (GLUCOPHAGE) 1000 MG tablet TAKE 1 TABLET (1,000 MG TOTAL) BY MOUTH 2 (TWO) TIMES DAILY WITH A MEAL. 180 tablet 3  . omeprazole (PRILOSEC) 20 MG capsule Take 1 capsule (20 mg total) by mouth daily. 90 capsule 1  . ACCU-CHEK FASTCLIX LANCETS MISC TEST THREE TIMES DAILY 306 each 1  . ACCU-CHEK SMARTVIEW test strip TEST BLOOD SUGAR THREE TIMES DAILY 300 each 1  . amitriptyline  (ELAVIL) 50 MG tablet TAKE 1/2 TABLET BY MOUTH AT BEDTIME AS NEEDED FOR SLEEP (Patient not taking: Reported on 01/10/2015) 15 tablet 1  . NON FORMULARY Place 2 L into the nose daily. Wears with exertion and qhs      Allergies: Allergies as of 01/10/2015 - Review Complete 01/10/2015  Allergen Reaction Noted  . Doxycycline  11/13/2007   Past Medical History  Diagnosis Date  . Irritable bowel syndrome   . Fecal occult blood test positive   . Degenerative joint disease of hand   . Obesity   . Post-menopausal bleeding   . Dyslipidemia   . Diabetes mellitus   . Inadequate material resources   . Postmenopausal    Past Surgical History  Procedure Laterality Date  . Cholecystectomy    . Other surgical history      right shoulder tendon repair 05/2011   Family History  Problem Relation Age of Onset  . Diabetes Mother   . Obesity Mother   . Stroke Father   . Diabetes Sister   . Colon cancer Neg Hx    Social History   Social History  . Marital Status: Divorced    Spouse Name: N/A  . Number of Children: 2  . Years of Education: N/A   Occupational History  . unemployed    Social History Main Topics  . Smoking status: Never Smoker   . Smokeless tobacco: Never Used  . Alcohol Use: No  . Drug Use: No  . Sexual Activity: Not Currently   Other Topics Concern  . Not on file   Social History Narrative   Single.   Divorced x2.   Has 2 children, by two different fathers.   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 May apply for disability/SSI given her pain in her hands which has made interviewing for jobs difficult.   Never Smoked   Alcohol use-no   Drug use-no   Regular exercise-yes      Financial assistance approved for 100% discount at [facility] and has [facility] card per Enedina Finner   02/18/2010             Review of Systems: Pertinent items are noted in HPI.  Physical Exam: Blood pressure 135/47,  pulse 48, temperature 98.5 F (36.9 C), temperature source Oral, resp. rate 15, SpO2 94 %. Physical Exam  Constitutional: She is oriented to person, place, and time.  Morbidly obese female, sitting in bed, uncomfortable but NAD  HENT:  Head: Normocephalic and atraumatic.  Eyes: EOM are normal. No scleral icterus.  Neck:  JVD not  appreciated 2/2 body habitus  Cardiovascular:  Bradycardic.  Heart sounds distant. No adventitious sounds appreciated.  Distal pulses could not be palpated 2/2 edema.  Pulmonary/Chest: Effort normal. No respiratory distress.  Wheezes in anterior lung fields.  Crackles to mid-lung zones bilaterally.  Abdominal:  Large, distended, diffusely tender. 1+ pitting edema.  No guarding or rebound.  Musculoskeletal:  2+ pitting edema to thigh bilaterally.  Neurological: She is alert and oriented to person, place, and time.  Skin: Skin is warm and dry.     Lab results: Basic Metabolic Panel:  Recent Labs  01/10/15 1744  NA 139  K 5.8*  CL 107  CO2 26  GLUCOSE 89  BUN 45*  CREATININE 1.90*  CALCIUM 9.3  MG 2.0  PHOS 3.9   Liver Function Tests:  Recent Labs  01/10/15 1744  AST 17  ALT 9*  ALKPHOS 112  BILITOT 0.7  PROT 6.2*  ALBUMIN 3.1*   No results for input(s): LIPASE, AMYLASE in the last 72 hours. No results for input(s): AMMONIA in the last 72 hours. CBC:  Recent Labs  01/10/15 1744  WBC 7.3  NEUTROABS 5.0  HGB 11.0*  HCT 36.1  MCV 95.3  PLT 149*   Cardiac Enzymes: No results for input(s): CKTOTAL, CKMB, CKMBINDEX, TROPONINI in the last 72 hours. BNP: No results for input(s): PROBNP in the last 72 hours. D-Dimer: No results for input(s): DDIMER in the last 72 hours. CBG:  Recent Labs  01/10/15 2131  GLUCAP 71   TropI: 0.01 BNP: 313.7 D-dimer: 2.4  Imaging results:  Dg Chest 2 View  01/10/2015   CLINICAL DATA:  Right-sided chest pain and dyspnea for 1 week  EXAM: CHEST  2 VIEW  COMPARISON:  05/22/2013  FINDINGS:  Moderate cardiomegaly. Central and basilar pulmonary edema. No pneumothorax.  IMPRESSION: CHF with central and basilar edema.   Electronically Signed   By: Marybelle Killings M.D.   On: 01/10/2015 18:10    Other results: EKG: Bradycardia, type 1 2nd degree vs complete heart block with junctional escape.  Assessment & Plan by Problem: Active Problems:   * No active hospital problems. *  Ms. Leiker is a 64 yo woman with morbid obesity, DMII, HTN, HLD, IBS-diarrhea, OHS on 2L [place], and Type I 2nd degree heart block, and h/o dCHF exacerbation, presenting with 2-3 weeks of worsening SOB, weight gain, and LE edema.    Acute on Chronic Diastolic CHF Exacerbation: Orthopnea, LE edema, elevated BNP, and cardiomegaly and pulmonary edema on CXR.  She denies excessive salt intake and only reports 5-6 cups of water daily.  Therefore, most likely trigger is her AV block, as well as obesity and probable OHS.  Low concern for ACS, as patient denies anginal pain and atypical chest pain reproducible with palpation.  She has not been a candidate for stress test in the past.  She is also not a candidate for beta blocker given her bradycardia.   Wells PE score 0.  Wells DVT score 1.  However, D-dimer elevated.  Therefore, will obtain V/Q scan and repeat echo.  - Cardiology consult - V/Q scan - Echo - [place] - Cycle Troponins - ASA - Losartan 50 mg - Lasix 40 mg IV BID - Daily weights - Strict I/Os - ECG in AM  Atypical Chest Pain: ECG negative. Troponin negative. Pain reproducible with palpation.  No h/o angina or MI.  Wells PE 0.  Awaiting V/Q scan. - V/Q - Albuterol nebs PRN - Tylenol for  pain  Hyperkalemia: K 5.8 on admission.  No peaked T waves evident on ECG.  Magnesium 2.0. - Kayexalate 30 mg  AKI: Likely prerenal in setting of HF exacerbation.   - FeUrea - Lasix 40 mg IV BID  DMII with peripheral neuropathy: A1c 8.7% in June 2016.  Home insulin Lantus 87 units and no SSI.  - Lantus 60 units qHS -  SSI-M - Gabapentin 300 mg TID  HTN: Losartan 50 mg daily IBS: Bentyl 20 mg TID  FEN/GI - Carb modified - Protonix  DVT Ppx: Lovenox  Dispo: Disposition is deferred at this time, awaiting improvement of current medical problems.   The patient does have a current PCP (Aldine Contes, MD) and does need an [facility] hospital follow-up appointment after discharge.  The patient does not have transportation limitations that hinder transportation to clinic appointments.  Signed: Iline Oven, MD, PhD 01/10/2015, 9:48 PM

## 2015-01-10 NOTE — ED Notes (Signed)
PER EMS: pt reports for 4 days she has been SOB and weak, increased swelling in lower extremities and abdomen, with epigastric pain radiating to chest. BP- 143/72, HR-53 afib, 99% with 15L NRB. Hx of CHF, afib.

## 2015-01-10 NOTE — ED Notes (Signed)
Received call from NP, echo team called in. Patient is to stay in the room until completed.

## 2015-01-10 NOTE — ED Notes (Signed)
Spoke to [place] with radiology in regard to patient status. Dr. Viviana Simpler ok with travel to new unit with delay of nm study in the morning, would like admitting MD notified prior to transfer. Also relayed to [facility] that patient is only able to lay about 15 degrees in bed before becoming short of breath, probably unable to tolerate laying flat for 1 hour. Reviewed vitals.

## 2015-01-11 ENCOUNTER — Inpatient Hospital Stay (HOSPITAL_COMMUNITY): Payer: Commercial Managed Care - HMO

## 2015-01-11 ENCOUNTER — Encounter (HOSPITAL_COMMUNITY): Payer: Self-pay | Admitting: *Deleted

## 2015-01-11 DIAGNOSIS — I5032 Chronic diastolic (congestive) heart failure: Secondary | ICD-10-CM

## 2015-01-11 DIAGNOSIS — R0602 Shortness of breath: Secondary | ICD-10-CM

## 2015-01-11 DIAGNOSIS — Z79899 Other long term (current) drug therapy: Secondary | ICD-10-CM

## 2015-01-11 DIAGNOSIS — E1142 Type 2 diabetes mellitus with diabetic polyneuropathy: Secondary | ICD-10-CM

## 2015-01-11 DIAGNOSIS — I442 Atrioventricular block, complete: Secondary | ICD-10-CM

## 2015-01-11 DIAGNOSIS — Z794 Long term (current) use of insulin: Secondary | ICD-10-CM

## 2015-01-11 DIAGNOSIS — I5031 Acute diastolic (congestive) heart failure: Secondary | ICD-10-CM

## 2015-01-11 DIAGNOSIS — K589 Irritable bowel syndrome without diarrhea: Secondary | ICD-10-CM

## 2015-01-11 DIAGNOSIS — E875 Hyperkalemia: Secondary | ICD-10-CM

## 2015-01-11 DIAGNOSIS — R7989 Other specified abnormal findings of blood chemistry: Secondary | ICD-10-CM

## 2015-01-11 LAB — CBC
HCT: 36.3 % (ref 36.0–46.0)
Hemoglobin: 10.5 g/dL — ABNORMAL LOW (ref 12.0–15.0)
MCH: 27.9 pg (ref 26.0–34.0)
MCHC: 28.9 g/dL — AB (ref 30.0–36.0)
MCV: 96.5 fL (ref 78.0–100.0)
PLATELETS: 156 10*3/uL (ref 150–400)
RBC: 3.76 MIL/uL — AB (ref 3.87–5.11)
RDW: 16.8 % — AB (ref 11.5–15.5)
WBC: 5.8 10*3/uL (ref 4.0–10.5)

## 2015-01-11 LAB — BASIC METABOLIC PANEL
Anion gap: 7 (ref 5–15)
BUN: 43 mg/dL — AB (ref 6–20)
CALCIUM: 9.1 mg/dL (ref 8.9–10.3)
CO2: 25 mmol/L (ref 22–32)
CREATININE: 1.73 mg/dL — AB (ref 0.44–1.00)
Chloride: 110 mmol/L (ref 101–111)
GFR calc non Af Amer: 30 mL/min — ABNORMAL LOW (ref >60)
GFR, EST AFRICAN AMERICAN: 35 mL/min — AB (ref >60)
GLUCOSE: 122 mg/dL — AB (ref 65–99)
Potassium: 5.6 mmol/L — ABNORMAL HIGH (ref 3.5–5.1)
Sodium: 142 mmol/L (ref 135–145)

## 2015-01-11 LAB — GLUCOSE, CAPILLARY
GLUCOSE-CAPILLARY: 63 mg/dL — AB (ref 65–99)
Glucose-Capillary: 94 mg/dL (ref 65–99)
Glucose-Capillary: 84 mg/dL (ref 65–99)
Glucose-Capillary: 135 mg/dL — ABNORMAL HIGH (ref 65–99)
Glucose-Capillary: 168 mg/dL — ABNORMAL HIGH (ref 65–99)
Glucose-Capillary: 211 mg/dL — ABNORMAL HIGH (ref 65–99)

## 2015-01-11 LAB — MRSA PCR SCREENING: MRSA by PCR: NEGATIVE

## 2015-01-11 LAB — TSH: TSH: 4.466 u[IU]/mL (ref 0.350–4.500)

## 2015-01-11 LAB — TROPONIN I
Troponin I: <0.03 ng/mL (ref <0.031)
Troponin I: <0.03 ng/mL (ref <0.031)

## 2015-01-11 LAB — CREATININE, URINE, RANDOM: Creatinine, Urine: 36.69 mg/dL

## 2015-01-11 MED ORDER — HYDROMORPHONE HCL 1 MG/ML IJ SOLN: 0.5000 mg | INTRAMUSCULAR | Status: DC | PRN

## 2015-01-11 MED ORDER — ACETAMINOPHEN 650 MG RE SUPP: 650.0000 mg | Freq: Four times a day (QID) | RECTAL | Status: DC | PRN

## 2015-01-11 MED ORDER — INSULIN DETEMIR 100 UNIT/ML ~~LOC~~ SOLN
60.0000 [IU] | Freq: Every day | SUBCUTANEOUS | Status: DC
  Administered 2015-01-11 – 2015-01-17 (×7): 60 [IU] via SUBCUTANEOUS
  Filled 2015-01-11 (×9): qty 0.6

## 2015-01-11 MED ORDER — SODIUM CHLORIDE 0.9 % IJ SOLN
3.0000 mL | Freq: Two times a day (BID) | INTRAMUSCULAR | Status: DC
  Administered 2015-01-11 – 2015-01-15 (×10): 3 mL via INTRAVENOUS
  Administered 2015-01-15: 10 mL via INTRAVENOUS
  Administered 2015-01-16 – 2015-01-18 (×5): 3 mL via INTRAVENOUS

## 2015-01-11 MED ORDER — INFLUENZA VAC SPLIT QUAD 0.5 ML IM SUSY
0.5000 mL | PREFILLED_SYRINGE | INTRAMUSCULAR | Status: AC
  Administered 2015-01-12: 0.5 mL via INTRAMUSCULAR
  Filled 2015-01-11: qty 0.5

## 2015-01-11 MED ORDER — ACETAMINOPHEN 325 MG PO TABS
650.0000 mg | ORAL_TABLET | Freq: Four times a day (QID) | ORAL | Status: DC | PRN
  Administered 2015-01-11 – 2015-01-16 (×4): 650 mg via ORAL
  Filled 2015-01-11 (×4): qty 2

## 2015-01-11 MED ORDER — TECHNETIUM TC 99M DIETHYLENETRIAME-PENTAACETIC ACID: 42.3000 | Freq: Once | INTRAVENOUS | Status: DC | PRN

## 2015-01-11 MED ORDER — LOSARTAN POTASSIUM 50 MG PO TABS: 50.0000 mg | ORAL_TABLET | Freq: Every day | ORAL | Status: DC

## 2015-01-11 MED ORDER — ALBUTEROL SULFATE (2.5 MG/3ML) 0.083% IN NEBU: 2.5000 mg | INHALATION_SOLUTION | Freq: Once | RESPIRATORY_TRACT | Status: DC

## 2015-01-11 MED ORDER — ASPIRIN EC 81 MG PO TBEC
81.0000 mg | DELAYED_RELEASE_TABLET | Freq: Every day | ORAL | Status: DC
  Administered 2015-01-11 – 2015-01-18 (×8): 81 mg via ORAL
  Filled 2015-01-11 (×8): qty 1

## 2015-01-11 MED ORDER — TECHNETIUM TO 99M ALBUMIN AGGREGATED
6.0000 | Freq: Once | INTRAVENOUS | Status: AC | PRN
  Administered 2015-01-11: 6.2 via INTRAVENOUS

## 2015-01-11 MED ORDER — DICYCLOMINE HCL 20 MG PO TABS
20.0000 mg | ORAL_TABLET | Freq: Three times a day (TID) | ORAL | Status: DC | PRN
  Administered 2015-01-12: 20 mg via ORAL
  Filled 2015-01-11: qty 1

## 2015-01-11 MED ORDER — PANTOPRAZOLE SODIUM 40 MG PO TBEC
40.0000 mg | DELAYED_RELEASE_TABLET | Freq: Every day | ORAL | Status: DC
  Administered 2015-01-11 – 2015-01-18 (×8): 40 mg via ORAL
  Filled 2015-01-11 (×8): qty 1

## 2015-01-11 MED ORDER — ONDANSETRON HCL 4 MG/2ML IJ SOLN: 4.0000 mg | Freq: Three times a day (TID) | INTRAMUSCULAR | Status: DC | PRN

## 2015-01-11 MED ORDER — HEPARIN SODIUM (PORCINE) 5000 UNIT/ML IJ SOLN
5000.0000 [IU] | Freq: Three times a day (TID) | INTRAMUSCULAR | Status: DC
  Administered 2015-01-11 – 2015-01-18 (×22): 5000 [IU] via SUBCUTANEOUS
  Filled 2015-01-11 (×22): qty 1

## 2015-01-11 MED ORDER — GABAPENTIN 300 MG PO CAPS
300.0000 mg | ORAL_CAPSULE | Freq: Three times a day (TID) | ORAL | Status: DC
  Administered 2015-01-11 – 2015-01-18 (×24): 300 mg via ORAL
  Filled 2015-01-11 (×25): qty 1

## 2015-01-11 NOTE — ED Notes (Signed)
Spoke with [name] with radiology, patient to have nm study done in the morning.

## 2015-01-11 NOTE — ED Notes (Signed)
Called bed placement and AC to report plan of care, and for patient to be transported upstairs with delay in tests for in the morning.

## 2015-01-11 NOTE — Progress Notes (Signed)
Patient Name: Kathleen [name] Date of Encounter: 01/11/2015     Active Problems:   Hypertension   Chest pain   Edema   Hyperkalemia   Heart block   Acute on chronic diastolic congestive heart failure    SUBJECTIVE  Still SOB but better than yesterday.  Breathing not back at her baseline. Good UOP per patient.   CURRENT MEDS . albuterol  2.5 mg Nebulization Once  . aspirin EC  81 mg Oral Daily  . furosemide  40 mg Intravenous BID  . gabapentin  300 mg Oral TID  . heparin  5,000 Units Subcutaneous 3 times per day  . [START ON 01/12/2015] Influenza vac split quadrivalent PF  0.5 mL Intramuscular Tomorrow-1000  . insulin aspart  0-15 Units Subcutaneous TID WC  . insulin aspart  0-5 Units Subcutaneous QHS  . insulin detemir  60 Units Subcutaneous QHS  . pantoprazole  40 mg Oral Daily  . sodium chloride  3 mL Intravenous Q12H    OBJECTIVE  Filed Vitals:   01/11/15 0130 01/11/15 0524 01/11/15 0630 01/11/15 0925  BP: 88/69 123/49  93/54  Pulse: 47 41  51  Temp:   97.5 F (36.4 C) 97.5 F (36.4 C)  TempSrc:   Oral Oral  Resp: 17 19  19   Height:      Weight:      SpO2: 92% 92%  95%    Intake/Output Summary (Last 24 hours) at 01/11/15 1211 Last data filed at 01/11/15 1004  Gross per 24 hour  Intake    603 ml  Output    201 ml  Net    402 ml   Filed Weights   01/11/15 0126  Weight: 307 lb (139.254 kg)    PHYSICAL EXAM  General: Well developed and morbidly obese  Head: Eyes PERRLA, No xanthomas. Normal cephalic and atramatic Lungs: Crackles at bases bilaterally Heart: HRRR S1 S2 Pulses are 2+ & equal.  No carotid bruit. No JVD. No abdominal bruits. No femoral bruits. Abdomen: Bowel sounds are positive, abdomen tight without masses  Extremities: No clubbing, cyanosis. DP +1. Marked LE edema 3+ Neuro: Alert and oriented X 3. Psych: Good affect, responds appropriately  Accessory Clinical Findings  CBC  Recent Labs   01/10/15 1744 01/11/15 0910  WBC 7.3 5.8  NEUTROABS 5.0  --   HGB 11.0* 10.5*  HCT 36.1 36.3  MCV 95.3 96.5  PLT 149* [id number]   Basic Metabolic Panel  Recent Labs  01/10/15 1744 01/11/15 0910  NA 139 142  K 5.8* 5.6*  CL 107 110  CO2 26 25  GLUCOSE 89 122*  BUN 45* 43*  CREATININE 1.90* 1.73*  CALCIUM 9.3 9.1  MG 2.0  --   PHOS 3.9  --    Liver Function Tests  Recent Labs  01/10/15 1744  AST 17  ALT 9*  ALKPHOS 112  BILITOT 0.7  PROT 6.2*  ALBUMIN 3.1*   No results for input(s): LIPASE, AMYLASE in the last 72 hours. Cardiac Enzymes  Recent Labs  01/10/15 2324 01/11/15 0306 01/11/15 0910  TROPONINI <0.03 <0.03 <0.03   BNP Invalid input(s): POCBNP D-Dimer  Recent Labs  01/10/15 2136  DDIMER 2.40*   Hemoglobin A1C No results for input(s): HGBA1C in the last 72 hours. Fasting Lipid Panel No results for input(s): CHOL, HDL, LDLCALC, TRIG, CHOLHDL, LDLDIRECT in the last 72 hours. Thyroid Function Tests  Recent Labs  01/11/15 0306  TSH 4.466    TELE  Low voltage. bradycardic  Radiology/Studies  Dg Chest 2 View  01/10/2015   CLINICAL DATA:  Right-sided chest pain and dyspnea for 1 week  EXAM: CHEST  2 VIEW  COMPARISON:  05/22/2013  FINDINGS: Moderate cardiomegaly. Central and basilar pulmonary edema. No pneumothorax.  IMPRESSION: CHF with central and basilar edema.   Electronically Signed   By: Marybelle Killings M.D.   On: 01/10/2015 18:10    ASSESSMENT AND PLAN  This is a 64yo morbidly obese WF with a history of HTN, morbid obesity, type 2 DM, IBS, dyslipidemia, Obesity hypoventilation syndrome (on home O2 with no OSA by PSG but showed nocturnal hypoxemia), chronic abdominal pain , chronic diastolic CHF, Wenkebach AV block and history of atypical stabbing CP who presents to the ER with complaints of a 3 week history of increasing SOB and LE edema as well as increased abdominal girth.  1. Acute diastolic CHF of ? Etiology. Most likely multifactorial  from dietary indiscretion, chronic nocturnal hypoxemia, bradycardia. -- Cont Lasix 40mg  BID IV. I/Os positive. Weight today 307 lbs. Will make sure she has strict I/Os and daily weights. -- 2D ECHO pending today.  -- She is still volume overloaed and breathing not back to baseline. Will continue IV diuresis.  2. Heart block - EKG is difficult to interpret. There is evidence of PR lengthening at times with dropped beats that appears to be Wenkebach. Her potassium is 5.8 which may be contributing. Given Kayexalate K down to 5.6 today. Hopefully this continues to improve with IV Lasix. --  She has a history of this on last admission with a very similar EKG. She is not on any AV nodal blocking agents.   3. Low voltage QRS - this was present on last admission and most likely due to morbid obesity and not pericardial effusion.2D ECHO pending today  4. Acute renal failure - ? Secondary to acute CHF vs. Diabetic nephropathy - will follow closely with diuresis. Creat improving with diuresis  5. DM on metformin - this will need to be held given ARF - treatment per IM  6. Morbid obesity with obesity hypoventilation syndrome and nighttime hypoxemia on home O2 - no OSA on PSG. Needs to be monitor to make sure hypoxemia resolved on O2 at night.  7. HTN - controlled. BP soft today.   8. Atypical CP that may be related to #1- troponin neg x3. Ddimer elevated to 2.4. Consider CTA vs V/Q scan to r/u PE.   Judy Pimple PA-C  Pager [phone]

## 2015-01-11 NOTE — Progress Notes (Signed)
Utilization review completed. Bertha Stanfill, RN, BSN. 

## 2015-01-11 NOTE — Progress Notes (Signed)
Subjective: Feels better than yesterday, however still having SOB. Urinating well however not being measured. Wt 308 today (compared to 280 on 6/23). Denies any chest pain. Has leg swelling. Has some generalized abdominal pain.  Objective: Vital signs in last 24 hours: Filed Vitals:   01/11/15 0130 01/11/15 0524 01/11/15 0630 01/11/15 0925  BP: 88/69 123/49  93/54  Pulse: 47 41  51  Temp:   97.5 F (36.4 C) 97.5 F (36.4 C)  TempSrc:   Oral Oral  Resp: 17 19  19   Height:      Weight:      SpO2: 92% 92%  95%   Weight change:   Intake/Output Summary (Last 24 hours) at 01/11/15 1343 Last data filed at 01/11/15 1004  Gross per 24 hour  Intake    603 ml  Output    201 ml  Net    402 ml   Vitals reviewed. General: pleasant, obese female, resting in bed, NAD HEENT: PERRL, EOMI, no scleral icterus Cardiac: RRR, no rubs, murmurs or gallops Pulm: mild crackles on the bases, distant breath sound.  Abd: distended, obese, has pitting edema and generalized mild tenderness.  Ext: warm and well perfused,1+ pitting edema upto knees b/l.  Neuro: alert and oriented X3,    Lab Results: Basic Metabolic Panel:  Recent Labs Lab 01/10/15 1744 01/11/15 0910  NA 139 142  K 5.8* 5.6*  CL 107 110  CO2 26 25  GLUCOSE 89 122*  BUN 45* 43*  CREATININE 1.90* 1.73*  CALCIUM 9.3 9.1  MG 2.0  --   PHOS 3.9  --    Liver Function Tests:  Recent Labs Lab 01/10/15 1744  AST 17  ALT 9*  ALKPHOS 112  BILITOT 0.7  PROT 6.2*  ALBUMIN 3.1*   No results for input(s): LIPASE, AMYLASE in the last 168 hours. No results for input(s): AMMONIA in the last 168 hours. CBC:  Recent Labs Lab 01/10/15 1744 01/11/15 0910  WBC 7.3 5.8  NEUTROABS 5.0  --   HGB 11.0* 10.5*  HCT 36.1 36.3  MCV 95.3 96.5  PLT 149* 156   Cardiac Enzymes:  Recent Labs Lab 01/10/15 2324 01/11/15 0306 01/11/15 0910  TROPONINI <0.03 <0.03 <0.03   BNP: No results for input(s): PROBNP in the last 168  hours. D-Dimer:  Recent Labs Lab 01/10/15 2136  DDIMER 2.40*   CBG:  Recent Labs Lab 01/10/15 2131 01/11/15 0207 01/11/15 0635 01/11/15 0741 01/11/15 1139  GLUCAP 71 63* 94 84 135*   Hemoglobin A1C: No results for input(s): HGBA1C in the last 168 hours. Fasting Lipid Panel: No results for input(s): CHOL, HDL, LDLCALC, TRIG, CHOLHDL, LDLDIRECT in the last 168 hours. Thyroid Function Tests:  Recent Labs Lab 01/11/15 0306  TSH 4.466   Coagulation: No results for input(s): LABPROT, INR in the last 168 hours. Anemia Panel: No results for input(s): VITAMINB12, FOLATE, FERRITIN, TIBC, IRON, RETICCTPCT in the last 168 hours. Urine Drug Screen: Drugs of Abuse  No results found for: LABOPIA, COCAINSCRNUR, LABBENZ, AMPHETMU, THCU, LABBARB  Alcohol Level: No results for input(s): ETH in the last 168 hours. Urinalysis: No results for input(s): COLORURINE, LABSPEC, PHURINE, GLUCOSEU, HGBUR, BILIRUBINUR, KETONESUR, PROTEINUR, UROBILINOGEN, NITRITE, LEUKOCYTESUR in the last 168 hours.  Invalid input(s): APPERANCEUR Misc. Labs:  Micro Results: Recent Results (from the past 240 hour(s))  MRSA PCR Screening     Status: None   Collection Time: 01/11/15  1:13 AM  Result Value Ref Range Status  MRSA by PCR NEGATIVE NEGATIVE Final    Comment:        The GeneXpert MRSA Assay (FDA approved for NASAL specimens only), is one component of a comprehensive MRSA colonization surveillance program. It is not intended to diagnose MRSA infection nor to guide or monitor treatment for MRSA infections.    Studies/Results: Dg Chest 2 View  01/10/2015   CLINICAL DATA:  Right-sided chest pain and dyspnea for 1 week  EXAM: CHEST  2 VIEW  COMPARISON:  05/22/2013  FINDINGS: Moderate cardiomegaly. Central and basilar pulmonary edema. No pneumothorax.  IMPRESSION: CHF with central and basilar edema.   Electronically Signed   By: Marybelle Killings M.D.   On: 01/10/2015 18:10   Medications: I have  reviewed the patient's current medications. Scheduled Meds: . albuterol  2.5 mg Nebulization Once  . aspirin EC  81 mg Oral Daily  . furosemide  40 mg Intravenous BID  . gabapentin  300 mg Oral TID  . heparin  5,000 Units Subcutaneous 3 times per day  . [START ON 01/12/2015] Influenza vac split quadrivalent PF  0.5 mL Intramuscular Tomorrow-1000  . insulin aspart  0-15 Units Subcutaneous TID WC  . insulin aspart  0-5 Units Subcutaneous QHS  . insulin detemir  60 Units Subcutaneous QHS  . pantoprazole  40 mg Oral Daily  . sodium chloride  3 mL Intravenous Q12H   Continuous Infusions:  PRN Meds:.acetaminophen **OR** acetaminophen, dicyclomine Assessment/Plan: Active Problems:   Hypertension   Chest pain   Edema   Hyperkalemia   Heart block   Acute on chronic diastolic congestive heart failure  64 yo female with hx of D CHF, HTN, heart block, DM II, here with CHF exacerbation.  Resp distress likely 2/2 to Diastolic CHF exacerbation - improving with lasix.  D-dimer was elevated, however, her SOB is likely from CHF. Wells score is  unable to get V/Q scan as unable to lie flat, will not do CTA with his AKI. Unclear if heart block caused the exacerbation. Denies dietary indiscretion or medication noncompliance. - repeat ECHO - continue lasix 40 IV BID today, asked RN to carefully record Urine output which hasn't been done today. Check daily weights. - hold losartan with AKi. Cont ASA (not sure whether this is needed in diastolic CHF).  Questionable complete heart block ? - hard to tell from EKG as very low voltage and P waves are hard to discern. - regular, likely not AFib.  - f/up card recs.   AKI - likely cardiorenal - improving with lasix -continue to monitor BMET    HyperK+ - improved with kayex K still is 5.6 (from 5.8), this is mild. Will likely improve with IV lasix - continue to monitor BMET  DM II with neuropathy - A1c 8.7 in 09/2014.  - continue lantus 60 + gabapentin  + SSI-M - recheck hgba1c.   Dispo: Disposition is deferred at this time, awaiting improvement of current medical problems.  Anticipated discharge in approximately 1-2 day(s).   The patient does have a current PCP (Aldine Contes, MD) and does need an [facility] hospital follow-up appointment after discharge.  The patient does have transportation limitations that hinder transportation to clinic appointments.  .Services Needed at time of discharge: Y = Yes, Blank = No PT:   OT:   RN:   Equipment:   Other:     LOS: 1 day   Dellia Nims, MD 01/11/2015, 1:43 PM

## 2015-01-11 NOTE — Progress Notes (Signed)
CSW Armed forces technical officer) spoke with pt about transportation issues. Pt reports she was established with Medicaid transportation but the company she used told her they could not provide transport. CSW asked if pt was calling main Medicaid transport number and pt reports she was not. Pt was calling company directly. CSW advised pt to call main dispatch number and if she is no longer established request to be re-established. CSW provided pt with transportation packet. Pt reports she has tried to be established with [facility] but cannot because of where she lives. Pt has no further hospital social work needs. CSW signing off.  [place], [place]

## 2015-01-11 NOTE — Care Management Note (Addendum)
Case Management Note  Patient Details  Name: Kathleen [name] MRN: [id number] Date of Birth: 12/11/1950  Subjective/Objective: Pt admitted for increased SOB. Initiated on IV Lasix. Pt is from home alone. Has DME 02 @ 2L via [place].                      Action/Plan: HF Screen Consult: CM did speak with pt in regards to home needs. Pt will benefit from [facility] RN for CHF Management. Pt is agreeable to services and chose [facility] for services. CM did make referral with [facility]. SOC to begin within 24-48 hrs post d/c. CM did call MD for [facility] RN order. Referral sent to CSW for transportation needs. Pt did use [employer] in the past. No further needs from CM at this time.    Expected Discharge Date:                  Expected Discharge Plan:  [place]  In-House Referral:     Discharge planning Services  CM Consult  Post Acute Care Choice:  Home Health Choice offered to:  Patient  DME Arranged:  N/A DME Agency:  NA  HH Arranged:  RN, Social Work [employer] Agency:  [place]  Status of Service:  Completed, signed off  Medicare Important Message Given:    Date Medicare IM Given:    Medicare IM give by:    Date Additional Medicare IM Given:    Additional Medicare Important Message give by:     If discussed at [place] of Stay Meetings, dates discussed:     Additional Comments:    1014 01-18-15 [name], RN,BSN [phone] CM received sleep study referral and CPAP for pt. Staff RN spoke with pt and pt feels like she will not need Sleep Study at this time. Staff RN did speak with MD in regards that pt is refusing at this time. No further needs from CM at this time.   1111 01-17-15 [name], RN,BSN [phone] [facility] will f/u once pt is stable for d/c. Pt is agreeable to services. Per pt she was having difficulty with her 02 before admission. Pt has DME 02 with [facility]. CM did call Liaison with [facility] for DME to make sure service order had been made  and completed for 02. No further needs from CM at this time.    [id number] 01-16-15 [name], RN,BSN [phone] Pt to have PT/OT consult for recommendations. Pt is currently active with [facility] will need orders once stable for d/c. HHRN, PT&  SW. Will continue to monitor.   1456 01-14-15 [name], RN,BSN [phone]  Pt continues on IV lasix 60 mg bid. Plan wil be for home once stable with [facility] services. No further needs from CM at this time.   [name], RN 01/11/2015, 10:20 AM

## 2015-01-12 ENCOUNTER — Inpatient Hospital Stay (HOSPITAL_COMMUNITY): Payer: Commercial Managed Care - HMO

## 2015-01-12 DIAGNOSIS — E662 Morbid (severe) obesity with alveolar hypoventilation: Secondary | ICD-10-CM | POA: Diagnosis not present

## 2015-01-12 DIAGNOSIS — I5033 Acute on chronic diastolic (congestive) heart failure: Secondary | ICD-10-CM | POA: Diagnosis not present

## 2015-01-12 DIAGNOSIS — R0789 Other chest pain: Secondary | ICD-10-CM

## 2015-01-12 DIAGNOSIS — E1142 Type 2 diabetes mellitus with diabetic polyneuropathy: Secondary | ICD-10-CM | POA: Diagnosis not present

## 2015-01-12 DIAGNOSIS — E873 Alkalosis: Secondary | ICD-10-CM | POA: Diagnosis not present

## 2015-01-12 DIAGNOSIS — Z6841 Body Mass Index (BMI) 40.0 and over, adult: Secondary | ICD-10-CM | POA: Diagnosis not present

## 2015-01-12 DIAGNOSIS — I441 Atrioventricular block, second degree: Secondary | ICD-10-CM | POA: Diagnosis not present

## 2015-01-12 DIAGNOSIS — N179 Acute kidney failure, unspecified: Secondary | ICD-10-CM | POA: Diagnosis not present

## 2015-01-12 DIAGNOSIS — Z9981 Dependence on supplemental oxygen: Secondary | ICD-10-CM | POA: Diagnosis not present

## 2015-01-12 DIAGNOSIS — R0600 Dyspnea, unspecified: Secondary | ICD-10-CM

## 2015-01-12 DIAGNOSIS — I10 Essential (primary) hypertension: Secondary | ICD-10-CM | POA: Diagnosis not present

## 2015-01-12 LAB — BASIC METABOLIC PANEL
ANION GAP: 6 (ref 5–15)
BUN: 45 mg/dL — ABNORMAL HIGH (ref 6–20)
CALCIUM: 8.9 mg/dL (ref 8.9–10.3)
CO2: 30 mmol/L (ref 22–32)
Chloride: 105 mmol/L (ref 101–111)
Creatinine, Ser: 1.66 mg/dL — ABNORMAL HIGH (ref 0.44–1.00)
GFR calc non Af Amer: 32 mL/min — ABNORMAL LOW (ref >60)
GFR, EST AFRICAN AMERICAN: 37 mL/min — AB (ref >60)
Glucose, Bld: 155 mg/dL — ABNORMAL HIGH (ref 65–99)
POTASSIUM: 5 mmol/L (ref 3.5–5.1)
Sodium: 141 mmol/L (ref 135–145)

## 2015-01-12 LAB — UREA NITROGEN, URINE: Urea Nitrogen, Ur: 246 mg/dL

## 2015-01-12 LAB — GLUCOSE, CAPILLARY
GLUCOSE-CAPILLARY: 194 mg/dL — AB (ref 65–99)
GLUCOSE-CAPILLARY: 150 mg/dL — AB (ref 65–99)
Glucose-Capillary: 148 mg/dL — ABNORMAL HIGH (ref 65–99)
Glucose-Capillary: 164 mg/dL — ABNORMAL HIGH (ref 65–99)

## 2015-01-12 LAB — HEMOGLOBIN A1C
Hgb A1c MFr Bld: 6.9 % — ABNORMAL HIGH (ref 4.8–5.6)
Mean Plasma Glucose: 151 mg/dL

## 2015-01-12 MED ORDER — SALINE SPRAY 0.65 % NA SOLN
1.0000 | NASAL | Status: DC | PRN
  Administered 2015-01-12 – 2015-01-14 (×3): 1 via NASAL
  Filled 2015-01-12: qty 44

## 2015-01-12 MED ORDER — FUROSEMIDE 10 MG/ML IJ SOLN
80.0000 mg | Freq: Two times a day (BID) | INTRAMUSCULAR | Status: DC
  Administered 2015-01-12 (×2): 80 mg via INTRAVENOUS
  Filled 2015-01-12 (×2): qty 8

## 2015-01-12 MED ORDER — DM-GUAIFENESIN ER 30-600 MG PO TB12
1.0000 | ORAL_TABLET | Freq: Two times a day (BID) | ORAL | Status: DC | PRN
  Administered 2015-01-12: 1 via ORAL
  Filled 2015-01-12: qty 1

## 2015-01-12 NOTE — Progress Notes (Signed)
  Echocardiogram 2D Echocardiogram has been performed.  Kathleen [name] 01/12/2015, 9:27 AM

## 2015-01-12 NOTE — Progress Notes (Addendum)
Subjective: Patient had good urine output overnight, one episode of urgency before nurses could assist with bedside transfer. Continued orthopnea. On 3L O2 by [place]. No chest pain or other complaints.  Objective: Vital signs in last 24 hours: Filed Vitals:   01/11/15 2020 01/11/15 2100 01/11/15 2322 01/12/15 0500  BP: 116/66 110/50 120/49 142/46  Pulse: 55 56 51 58  Temp:  98 F (36.7 C) 98.1 F (36.7 C) 98.1 F (36.7 C)  TempSrc:      Resp: 18 16 18 20   Height:      Weight:    143.11 kg (315 lb 8 oz)  SpO2: 92% 95% 95% 96%   Weight change: 3.856 kg (8 lb 8 oz)  Intake/Output Summary (Last 24 hours) at 01/12/15 1433 Last data filed at 01/12/15 0930  Gross per 24 hour  Intake    240 ml  Output   1050 ml  Net   -810 ml   GENERAL- pleasant, obese, NAD HEENT- Atraumatic, PERRL, EOMI, oral mucosa appears moist CARDIAC- RRR, no murmurs, rubs or gallops. RESP- distant breath sounds, bibasilar crackles ABDOMEN- Tight, venous varicosities, bowel sounds normoactive throughout EXTREMITIES- 3+ pitting edema up length of legs b/l SKIN- Warm, dry, No rash or lesion. PSYCH- Normal mood and affect, appropriate thought content and speech.  Lab Results: Basic Metabolic Panel:  Recent Labs Lab 01/10/15 1744 01/11/15 0910 01/12/15 1045  NA 139 142 141  K 5.8* 5.6* 5.0  CL 107 110 105  CO2 26 25 30   GLUCOSE 89 122* 155*  BUN 45* 43* 45*  CREATININE 1.90* 1.73* 1.66*  CALCIUM 9.3 9.1 8.9  MG 2.0  --   --   PHOS 3.9  --   --    Liver Function Tests:  Recent Labs Lab 01/10/15 1744  AST 17  ALT 9*  ALKPHOS 112  BILITOT 0.7  PROT 6.2*  ALBUMIN 3.1*   No results for input(s): LIPASE, AMYLASE in the last 168 hours. No results for input(s): AMMONIA in the last 168 hours. CBC:  Recent Labs Lab 01/10/15 1744 01/11/15 0910  WBC 7.3 5.8  NEUTROABS 5.0  --   HGB 11.0* 10.5*  HCT 36.1 36.3  MCV 95.3 96.5  PLT 149* 156   Cardiac Enzymes:  Recent Labs Lab  01/11/15 0306 01/11/15 0910 01/11/15 1515  TROPONINI <0.03 <0.03 <0.03   BNP: No results for input(s): PROBNP in the last 168 hours. D-Dimer:  Recent Labs Lab 01/10/15 2136  DDIMER 2.40*   CBG:  Recent Labs Lab 01/11/15 0741 01/11/15 1139 01/11/15 1631 01/11/15 2103 01/12/15 0720 01/12/15 1148  GLUCAP 84 135* 168* 211* 148* 194*   Hemoglobin A1C:  Recent Labs Lab 01/11/15 1548  HGBA1C 6.9*   Fasting Lipid Panel: No results for input(s): CHOL, HDL, LDLCALC, TRIG, CHOLHDL, LDLDIRECT in the last 168 hours. Thyroid Function Tests:  Recent Labs Lab 01/11/15 0306  TSH 4.466   Coagulation: No results for input(s): LABPROT, INR in the last 168 hours. Anemia Panel: No results for input(s): VITAMINB12, FOLATE, FERRITIN, TIBC, IRON, RETICCTPCT in the last 168 hours. Urine Drug Screen: Drugs of Abuse  No results found for: LABOPIA, COCAINSCRNUR, LABBENZ, AMPHETMU, THCU, LABBARB  Alcohol Level: No results for input(s): ETH in the last 168 hours. Urinalysis: No results for input(s): COLORURINE, LABSPEC, PHURINE, GLUCOSEU, HGBUR, BILIRUBINUR, KETONESUR, PROTEINUR, UROBILINOGEN, NITRITE, LEUKOCYTESUR in the last 168 hours.  Invalid input(s): APPERANCEUR  Micro Results: Recent Results (from the past 240 hour(s))  MRSA PCR Screening  Status: None   Collection Time: 01/11/15  1:13 AM  Result Value Ref Range Status   MRSA by PCR NEGATIVE NEGATIVE Final    Comment:        The GeneXpert MRSA Assay (FDA approved for NASAL specimens only), is one component of a comprehensive MRSA colonization surveillance program. It is not intended to diagnose MRSA infection nor to guide or monitor treatment for MRSA infections.    Studies/Results: Dg Chest 2 View  01/10/2015   CLINICAL DATA:  Right-sided chest pain and dyspnea for 1 week  EXAM: CHEST  2 VIEW  COMPARISON:  05/22/2013  FINDINGS: Moderate cardiomegaly. Central and basilar pulmonary edema. No pneumothorax.   IMPRESSION: CHF with central and basilar edema.   Electronically Signed   By: Marybelle Killings M.D.   On: 01/10/2015 18:10   Nm Pulmonary Perf And Vent  01/11/2015   CLINICAL DATA:  Short of breath for four weeks  EXAM: NUCLEAR MEDICINE VENTILATION - PERFUSION LUNG SCAN  TECHNIQUE: Ventilation images were obtained in multiple projections using inhaled aerosol Tc-48m DTPA. Perfusion images were obtained in multiple projections after intravenous injection of Tc-53m MAA.  RADIOPHARMACEUTICALS:  42.20mci Technetium-60m DTPA aerosol inhalation and 6.41mci Technetium-14m MAA IV  COMPARISON:  01/10/15 chest radiograph.  FINDINGS: Ventilation: Heterogenous uptake bilaterally, with less acitivity in the left lung as compared to the right.  Perfusion: Small subsegmental defect in the posterior lateral right upper lobe. Much larger ventilation defect, with no abnormalities on chest radiograph in this area.  IMPRESSION: Very low probability for PE   Electronically Signed   By: Skipper Cliche M.D.   On: 01/11/2015 15:45   Medications: I have reviewed the patient's current medications. Scheduled Meds: . albuterol  2.5 mg Nebulization Once  . aspirin EC  81 mg Oral Daily  . furosemide  80 mg Intravenous BID  . gabapentin  300 mg Oral TID  . heparin  5,000 Units Subcutaneous 3 times per day  . insulin aspart  0-15 Units Subcutaneous TID WC  . insulin aspart  0-5 Units Subcutaneous QHS  . insulin detemir  60 Units Subcutaneous QHS  . pantoprazole  40 mg Oral Daily  . sodium chloride  3 mL Intravenous Q12H   Continuous Infusions:  PRN Meds:.acetaminophen **OR** acetaminophen, dextromethorphan-guaiFENesin **AND** sodium chloride, dicyclomine, technetium TC 77M diethylenetriame-pentaacetic acid Assessment/Plan: Resp distress 2/2 to diastolic CHF exacerbation Improving with diuresis. V/Q scan negative yesterday. ECHO obtained, awaiting read. Holding losartan with AKI. Initially good response to increased IV lasix but  patient has significant discomfort. Can diurese aggressively follow symptoms and metabolic panels closely. - lasix up to 80 IV BID today - Request RN to carefully record Urine output and check weight consistently - F/U echo  Heart block At this point does not seem to be driving cause per cardiology, looks to be second degree type 1. She has remained in stable sinus rhythm since presentation.   AKI Likely cardiorenal as this is improving with diuretics. FeUrea is 24.7% consistent with a pre-renal AKI calculated - qAM Bmet  Hyperkalemia K decreased to 5.0 from 5.6 with kayexalate and high dose lasix. No replacement or treatment indicated at this time. - continue to monitor Bmet  DM II with neuropathy A1c 6.9 at admission  - continue lantus 60 + SSI-M - gabapentin 300mg  TID  GI ppx: pantoprazole 40mg   Diet: Carb modified DVT ppx: [place] Heparin FULL CODE  Dispo: Disposition is deferred at this time, awaiting improvement of current medical problems. Anticipated  discharge in approximately 1-2 day(s).    LOS: 2 days   Collier Salina, MD 01/12/2015, 2:33 PM

## 2015-01-12 NOTE — Progress Notes (Signed)
Patient Name: Kathleen [name] Date of Encounter: 01/12/2015     Active Problems:   Hypertension   Chest pain   Edema   Hyperkalemia   Heart block   Acute on chronic diastolic congestive heart failure    SUBJECTIVE  She slept better this morning. She thinks that her breathing may be a little bit better. Good UOP per patient.   CURRENT MEDS . albuterol  2.5 mg Nebulization Once  . aspirin EC  81 mg Oral Daily  . furosemide  80 mg Intravenous BID  . gabapentin  300 mg Oral TID  . heparin  5,000 Units Subcutaneous 3 times per day  . Influenza vac split quadrivalent PF  0.5 mL Intramuscular Tomorrow-1000  . insulin aspart  0-15 Units Subcutaneous TID WC  . insulin aspart  0-5 Units Subcutaneous QHS  . insulin detemir  60 Units Subcutaneous QHS  . pantoprazole  40 mg Oral Daily  . sodium chloride  3 mL Intravenous Q12H    OBJECTIVE  Filed Vitals:   01/11/15 2020 01/11/15 2100 01/11/15 2322 01/12/15 0500  BP: 116/66 110/50 120/49 142/46  Pulse: 55 56 51 58  Temp:  98 F (36.7 C) 98.1 F (36.7 C) 98.1 F (36.7 C)  TempSrc:      Resp: 18 16 18 20   Height:      Weight:    315 lb 8 oz (143.11 kg)  SpO2: 92% 95% 95% 96%    Intake/Output Summary (Last 24 hours) at 01/12/15 0929 Last data filed at 01/12/15 0500  Gross per 24 hour  Intake    643 ml  Output    700 ml  Net    -57 ml   Filed Weights   01/11/15 0126 01/12/15 0500  Weight: 307 lb (139.254 kg) 315 lb 8 oz (143.11 kg)    PHYSICAL EXAM  General: Well developed and morbidly obese  Head: Eyes PERRLA, No xanthomas. Normal cephalic and atramatic Lungs: Crackles at bases bilaterally Heart: HRRR S1 S2 Pulses are 2+ & equal.  No carotid bruit. No JVD. No abdominal bruits. No femoral bruits. Abdomen: Bowel sounds are positive, abdomen tight without masses  Extremities: No clubbing, cyanosis. DP +1. Marked LE edema 3+ Neuro: Alert and oriented X 3. Psych: Good affect, responds  appropriately  Accessory Clinical Findings  CBC  Recent Labs  01/10/15 1744 01/11/15 0910  WBC 7.3 5.8  NEUTROABS 5.0  --   HGB 11.0* 10.5*  HCT 36.1 36.3  MCV 95.3 96.5  PLT 149* [id number]   Basic Metabolic Panel  Recent Labs  01/10/15 1744 01/11/15 0910  NA 139 142  K 5.8* 5.6*  CL 107 110  CO2 26 25  GLUCOSE 89 122*  BUN 45* 43*  CREATININE 1.90* 1.73*  CALCIUM 9.3 9.1  MG 2.0  --   PHOS 3.9  --    Liver Function Tests  Recent Labs  01/10/15 1744  AST 17  ALT 9*  ALKPHOS 112  BILITOT 0.7  PROT 6.2*  ALBUMIN 3.1*   No results for input(s): LIPASE, AMYLASE in the last 72 hours. Cardiac Enzymes  Recent Labs  01/11/15 0306 01/11/15 0910 01/11/15 1515  TROPONINI <0.03 <0.03 <0.03   BNP Invalid input(s): POCBNP D-Dimer  Recent Labs  01/10/15 2136  DDIMER 2.40*   Hemoglobin A1C  Recent Labs  01/11/15 1548  HGBA1C 6.9*   Fasting Lipid Panel No results for input(s): CHOL, HDL, LDLCALC, TRIG, CHOLHDL, LDLDIRECT in the last 72 hours.  Thyroid Function Tests  Recent Labs  01/11/15 0306  TSH 4.466    TELE  Currently sinus rhythm heart rate 60. Low voltage noted secondary to body habitus. She does occasionally have second-degree heart block type I on telemetry.  Radiology/Studies  Dg Chest 2 View  01/10/2015   CLINICAL DATA:  Right-sided chest pain and dyspnea for 1 week  EXAM: CHEST  2 VIEW  COMPARISON:  05/22/2013  FINDINGS: Moderate cardiomegaly. Central and basilar pulmonary edema. No pneumothorax.  IMPRESSION: CHF with central and basilar edema.   Electronically Signed   By: Marybelle Killings M.D.   On: 01/10/2015 18:10   Nm Pulmonary Perf And Vent  01/11/2015   CLINICAL DATA:  Short of breath for four weeks  EXAM: NUCLEAR MEDICINE VENTILATION - PERFUSION LUNG SCAN  TECHNIQUE: Ventilation images were obtained in multiple projections using inhaled aerosol Tc-16m DTPA. Perfusion images were obtained in multiple projections after intravenous  injection of Tc-77m MAA.  RADIOPHARMACEUTICALS:  42.67mci Technetium-30m DTPA aerosol inhalation and 6.70mci Technetium-63m MAA IV  COMPARISON:  01/10/15 chest radiograph.  FINDINGS: Ventilation: Heterogenous uptake bilaterally, with less acitivity in the left lung as compared to the right.  Perfusion: Small subsegmental defect in the posterior lateral right upper lobe. Much larger ventilation defect, with no abnormalities on chest radiograph in this area.  IMPRESSION: Very low probability for PE   Electronically Signed   By: Skipper Cliche M.D.   On: 01/11/2015 15:45    ASSESSMENT AND PLAN  64 year old with morbid obesity, diabetes, obesity hypoventilation syndrome on home O2 with chronic diastolic heart failure mostly result of morbid obesity, second-degree heart block type I here with acute on chronic diastolic heart failure.   1. Acute on chronic diastolic CHF - Most likely multifactorial from dietary indiscretion, chronic nocturnal hypoxemia, weight. -- Cont Lasix 80mg  BID IV. I/Os positive.  Will make sure she has strict I/Os and daily weights. -- 2D ECHO pending read today. Performed -- She is still volume overloaed and breathing a little better but not back to baseline. Will continue IV diuresis, aggressive 80 mg twice a day. Her creatinine has actually come down from 1.9-1.7.  2. Second-degree Heart block type 1 -  PR lengthening at times with dropped beats that appears to be Wenkebach. Currently stable rhythm.--  She has a history of this on last admission with a very similar EKG. She is not on any AV nodal blocking agents. I do not think that this is playing a significant role in her current symptoms. She has not had any high risk symptoms such as syncope. I do not feel they pacemaker is warranted at this time.   3. Low voltage QRS - this was present on last admission and most likely due to morbid obesity and not pericardial effusion.2D ECHO pending read today  4. Acute renal  failure - ? Secondary to acute CHF vs. Diabetic nephropathy - will follow closely with diuresis. Creat improving with diuresis  5. DM on metformin - this will need to be held given ARF - treatment per IM  6. Morbid obesity with obesity hypoventilation syndrome and nighttime hypoxemia on home O2 - no OSA on PSG. Needs to be monitor to make sure hypoxemia resolved on O2 at night.  7. HTN - controlled. BP soft today. May be in part spurious reading from obesity, thick arms   8. Atypical CP that may be related to #1- troponin neg x3.   Bobby Rumpf MD

## 2015-01-13 DIAGNOSIS — E8770 Fluid overload, unspecified: Secondary | ICD-10-CM

## 2015-01-13 LAB — GLUCOSE, CAPILLARY
GLUCOSE-CAPILLARY: 119 mg/dL — AB (ref 65–99)
GLUCOSE-CAPILLARY: 135 mg/dL — AB (ref 65–99)
Glucose-Capillary: 145 mg/dL — ABNORMAL HIGH (ref 65–99)
Glucose-Capillary: 141 mg/dL — ABNORMAL HIGH (ref 65–99)

## 2015-01-13 LAB — BASIC METABOLIC PANEL
ANION GAP: 7 (ref 5–15)
BUN: 49 mg/dL — AB (ref 6–20)
CO2: 31 mmol/L (ref 22–32)
Calcium: 8.9 mg/dL (ref 8.9–10.3)
Chloride: 105 mmol/L (ref 101–111)
Creatinine, Ser: 1.72 mg/dL — ABNORMAL HIGH (ref 0.44–1.00)
GFR, EST AFRICAN AMERICAN: 35 mL/min — AB (ref >60)
GFR, EST NON AFRICAN AMERICAN: 30 mL/min — AB (ref >60)
Glucose, Bld: 135 mg/dL — ABNORMAL HIGH (ref 65–99)
POTASSIUM: 5 mmol/L (ref 3.5–5.1)
SODIUM: 143 mmol/L (ref 135–145)

## 2015-01-13 MED ORDER — FUROSEMIDE 10 MG/ML IJ SOLN
40.0000 mg | Freq: Two times a day (BID) | INTRAMUSCULAR | Status: DC
  Administered 2015-01-13 (×2): 40 mg via INTRAVENOUS
  Filled 2015-01-13 (×2): qty 4

## 2015-01-13 NOTE — Progress Notes (Signed)
Patient Name: Kathleen [name] Date of Encounter: 01/13/2015     Active Problems:   Hypertension   Chest pain   Edema   Hyperkalemia   Heart block   Acute on chronic diastolic congestive heart failure    SUBJECTIVE  She slept better this morning. She thinks that her breathing may be a little bit better. Good UOP per patient. Smiling.   CURRENT MEDS . albuterol  2.5 mg Nebulization Once  . aspirin EC  81 mg Oral Daily  . furosemide  40 mg Intravenous BID  . gabapentin  300 mg Oral TID  . heparin  5,000 Units Subcutaneous 3 times per day  . insulin aspart  0-15 Units Subcutaneous TID WC  . insulin aspart  0-5 Units Subcutaneous QHS  . insulin detemir  60 Units Subcutaneous QHS  . pantoprazole  40 mg Oral Daily  . sodium chloride  3 mL Intravenous Q12H    OBJECTIVE  Filed Vitals:   01/13/15 0338 01/13/15 0341 01/13/15 0343 01/13/15 0530  BP: 108/48   124/34  Pulse: 55   49  Temp: 98.1 F (36.7 C)     TempSrc: Oral     Resp: 15   20  Height:      Weight: 317 lb 8 oz (144.017 kg)     SpO2: 91% 93% 91% 90%    Intake/Output Summary (Last 24 hours) at 01/13/15 0902 Last data filed at 01/13/15 0529  Gross per 24 hour  Intake      3 ml  Output   1675 ml  Net  -1672 ml   Filed Weights   01/11/15 0126 01/12/15 0500 01/13/15 0338  Weight: 307 lb (139.254 kg) 315 lb 8 oz (143.11 kg) 317 lb 8 oz (144.017 kg)    PHYSICAL EXAM  General: Well developed and morbidly obese  Head: Eyes PERRLA, No xanthomas. Normal cephalic and atramatic Lungs: Crackles at bases bilaterally Heart: HRRR S1 S2 Pulses are 2+ & equal.  No carotid bruit. No JVD. No abdominal bruits. No femoral bruits. Abdomen: Bowel sounds are positive, abdomen tight without masses  Extremities: No clubbing, cyanosis. DP +1. Marked LE edema 3+ Neuro: Alert and oriented X 3. Psych: Good affect, responds appropriately  Accessory Clinical Findings  CBC  Recent Labs   01/10/15 1744 01/11/15 0910  WBC 7.3 5.8  NEUTROABS 5.0  --   HGB 11.0* 10.5*  HCT 36.1 36.3  MCV 95.3 96.5  PLT 149* [id number]   Basic Metabolic Panel  Recent Labs  01/10/15 1744  01/12/15 1045 01/13/15 0305  NA 139  < > 141 143  K 5.8*  < > 5.0 5.0  CL 107  < > 105 105  CO2 26  < > 30 31  GLUCOSE 89  < > 155* 135*  BUN 45*  < > 45* 49*  CREATININE 1.90*  < > 1.66* 1.72*  CALCIUM 9.3  < > 8.9 8.9  MG 2.0  --   --   --   PHOS 3.9  --   --   --   < > = values in this interval not displayed. Liver Function Tests  Recent Labs  01/10/15 1744  AST 17  ALT 9*  ALKPHOS 112  BILITOT 0.7  PROT 6.2*  ALBUMIN 3.1*   No results for input(s): LIPASE, AMYLASE in the last 72 hours. Cardiac Enzymes  Recent Labs  01/11/15 0306 01/11/15 0910 01/11/15 1515  TROPONINI <0.03 <0.03 <0.03   BNP Invalid  input(s): POCBNP D-Dimer  Recent Labs  01/10/15 2136  DDIMER 2.40*   Hemoglobin A1C  Recent Labs  01/11/15 1548  HGBA1C 6.9*   Fasting Lipid Panel No results for input(s): CHOL, HDL, LDLCALC, TRIG, CHOLHDL, LDLDIRECT in the last 72 hours. Thyroid Function Tests  Recent Labs  01/11/15 0306  TSH 4.466    TELE  Currently demonstrating second-degree heart block type I, first degree heart block at times. Heart rate periodically in the upper 40s, yesterday sinus rhythm 60. Low voltage noted secondary to body habitus. She does occasionally have second-degree heart block type I on telemetry.  Radiology/Studies  Dg Chest 2 View  01/10/2015   CLINICAL DATA:  Right-sided chest pain and dyspnea for 1 week  EXAM: CHEST  2 VIEW  COMPARISON:  05/22/2013  FINDINGS: Moderate cardiomegaly. Central and basilar pulmonary edema. No pneumothorax.  IMPRESSION: CHF with central and basilar edema.   Electronically Signed   By: Marybelle Killings M.D.   On: 01/10/2015 18:10   Nm Pulmonary Perf And Vent  01/11/2015   CLINICAL DATA:  Short of breath for four weeks  EXAM: NUCLEAR MEDICINE  VENTILATION - PERFUSION LUNG SCAN  TECHNIQUE: Ventilation images were obtained in multiple projections using inhaled aerosol Tc-67m DTPA. Perfusion images were obtained in multiple projections after intravenous injection of Tc-68m MAA.  RADIOPHARMACEUTICALS:  42.45mci Technetium-78m DTPA aerosol inhalation and 6.61mci Technetium-30m MAA IV  COMPARISON:  01/10/15 chest radiograph.  FINDINGS: Ventilation: Heterogenous uptake bilaterally, with less acitivity in the left lung as compared to the right.  Perfusion: Small subsegmental defect in the posterior lateral right upper lobe. Much larger ventilation defect, with no abnormalities on chest radiograph in this area.  IMPRESSION: Very low probability for PE   Electronically Signed   By: Skipper Cliche M.D.   On: 01/11/2015 15:45    ASSESSMENT AND PLAN  64 year old with morbid obesity, diabetes, obesity hypoventilation syndrome on home O2 with chronic diastolic heart failure mostly result of morbid obesity, second-degree heart block type I here with acute on chronic diastolic heart failure.   1. Acute on chronic diastolic CHF - Most likely multifactorial from dietary indiscretion, chronic nocturnal hypoxemia, weight. -- Cont Lasix 40mg  BID IV. I/Os positive.  Will make sure she has strict I/Os and daily weights. Continue.  -- She is still volume overloaed and breathing a little better but not back to baseline. Will continue IV diuresis, aggressive 80 mg twice a day. Her creatinine has actually come down from 1.9-1.7. --May be limited by blood pressure.  2. Second-degree Heart block type 1 -  PR lengthening at times with dropped beats that appears to be Wenkebach. Currently stable rhythm. First degree HB at times.   She has a history of this on last admission with a very similar EKG. She is not on any AV nodal blocking agents. Avoid. I do not think that this is playing a significant role in her current symptoms. She has not had any high risk symptoms such  as syncope. I do not feel they pacemaker is warranted at this time. Continue to monitor for more significant bradycardia.    3. Low voltage QRS - this was present on last admission and most likely due to morbid obesity and not pericardial effusion.2D ECHO has been done. Called ECHO dept.   4. Acute renal failure - ? Secondary to acute CHF vs. Diabetic nephropathy - will follow closely with diuresis. Creat improving with diuresis  5. DM on metformin - this will need  to be held given ARF - treatment per IM  6. Morbid obesity with obesity hypoventilation syndrome and nighttime hypoxemia on home O2 - no OSA on PSG. Needs to be monitor to make sure hypoxemia resolved on O2 at night.  7. HTN - controlled. BP soft today. May be in part spurious reading from obesity, thick arms   8. Atypical CP that may be related to #1- troponin neg x3.   Bobby Rumpf MD

## 2015-01-13 NOTE — Progress Notes (Signed)
Patient had a 2.8 seconds pause at 1625. Patient is asymptomatic. Will continue to monitor patient.

## 2015-01-13 NOTE — Progress Notes (Signed)
Subjective: More comfortable this morning, with improving orthopnea. Good urine output per patient. She is having less difficulty sitting upright for eating. Still on nasal cannula but holding conversation at regular pace without any difficulty.  Objective: Vital signs in last 24 hours: Filed Vitals:   01/13/15 0341 01/13/15 0343 01/13/15 0530 01/13/15 1025  BP:   124/34 125/54  Pulse:   49 54  Temp:    96.2 F (35.7 C)  TempSrc:    Oral  Resp:   20 19  Height:      Weight:      SpO2: 93% 91% 90% 90%   Weight change: 0.907 kg (2 lb)  Intake/Output Summary (Last 24 hours) at 01/13/15 1404 Last data filed at 01/13/15 1309  Gross per 24 hour  Intake    403 ml  Output   1525 ml  Net  -1122 ml   GENERAL- pleasant, morbidly obese, NAD HEENT- Atraumatic, PERRL, EOMI, oral mucosa appears moist CARDIAC- RRR, no murmurs, rubs or gallops. RESP- distant breath sounds, bibasilar crackles ABDOMEN- Tight, venous varicosities, bowel sounds normoactive throughout EXTREMITIES- 3+ pitting pedal edema SKIN- Warm, dry, No rash or lesion. PSYCH- Normal mood and affect, appropriate thought content and speech.  Lab Results: Basic Metabolic Panel:  Recent Labs Lab 01/10/15 1744  01/12/15 1045 01/13/15 0305  NA 139  < > 141 143  K 5.8*  < > 5.0 5.0  CL 107  < > 105 105  CO2 26  < > 30 31  GLUCOSE 89  < > 155* 135*  BUN 45*  < > 45* 49*  CREATININE 1.90*  < > 1.66* 1.72*  CALCIUM 9.3  < > 8.9 8.9  MG 2.0  --   --   --   PHOS 3.9  --   --   --   < > = values in this interval not displayed. Liver Function Tests:  Recent Labs Lab 01/10/15 1744  AST 17  ALT 9*  ALKPHOS 112  BILITOT 0.7  PROT 6.2*  ALBUMIN 3.1*   No results for input(s): LIPASE, AMYLASE in the last 168 hours. No results for input(s): AMMONIA in the last 168 hours. CBC:  Recent Labs Lab 01/10/15 1744 01/11/15 0910  WBC 7.3 5.8  NEUTROABS 5.0  --   HGB 11.0* 10.5*  HCT 36.1 36.3  MCV 95.3 96.5  PLT  149* 156   Cardiac Enzymes:  Recent Labs Lab 01/11/15 0306 01/11/15 0910 01/11/15 1515  TROPONINI <0.03 <0.03 <0.03   BNP: No results for input(s): PROBNP in the last 168 hours. D-Dimer:  Recent Labs Lab 01/10/15 2136  DDIMER 2.40*   CBG:  Recent Labs Lab 01/12/15 0720 01/12/15 1148 01/12/15 1704 01/12/15 2107 01/13/15 0723 01/13/15 1212  GLUCAP 148* 194* 150* 164* 119* 145*   Hemoglobin A1C:  Recent Labs Lab 01/11/15 1548  HGBA1C 6.9*   Fasting Lipid Panel: No results for input(s): CHOL, HDL, LDLCALC, TRIG, CHOLHDL, LDLDIRECT in the last 168 hours. Thyroid Function Tests:  Recent Labs Lab 01/11/15 0306  TSH 4.466   Coagulation: No results for input(s): LABPROT, INR in the last 168 hours. Anemia Panel: No results for input(s): VITAMINB12, FOLATE, FERRITIN, TIBC, IRON, RETICCTPCT in the last 168 hours. Urine Drug Screen: Drugs of Abuse  No results found for: LABOPIA, COCAINSCRNUR, LABBENZ, AMPHETMU, THCU, LABBARB  Alcohol Level: No results for input(s): ETH in the last 168 hours. Urinalysis: No results for input(s): COLORURINE, LABSPEC, [place], [place], [place], [place], [place], [place],  UROBILINOGEN, NITRITE, LEUKOCYTESUR in the last 168 hours.  Invalid input(s): APPERANCEUR   Micro Results: Recent Results (from the past 240 hour(s))  MRSA PCR Screening     Status: None   Collection Time: 01/11/15  1:13 AM  Result Value Ref Range Status   MRSA by PCR NEGATIVE NEGATIVE Final    Comment:        The GeneXpert MRSA Assay (FDA approved for NASAL specimens only), is one component of a comprehensive MRSA colonization surveillance program. It is not intended to diagnose MRSA infection nor to guide or monitor treatment for MRSA infections.    Studies/Results: Nm Pulmonary Perf And Vent  01/11/2015   CLINICAL DATA:  Short of breath for four weeks  EXAM: NUCLEAR MEDICINE VENTILATION - PERFUSION LUNG SCAN  TECHNIQUE: Ventilation images  were obtained in multiple projections using inhaled aerosol Tc-6m DTPA. Perfusion images were obtained in multiple projections after intravenous injection of Tc-76m MAA.  RADIOPHARMACEUTICALS:  42.20mci Technetium-51m DTPA aerosol inhalation and 6.37mci Technetium-36m MAA IV  COMPARISON:  01/10/15 chest radiograph.  FINDINGS: Ventilation: Heterogenous uptake bilaterally, with less acitivity in the left lung as compared to the right.  Perfusion: Small subsegmental defect in the posterior lateral right upper lobe. Much larger ventilation defect, with no abnormalities on chest radiograph in this area.  IMPRESSION: Very low probability for PE   Electronically Signed   By: Skipper Cliche M.D.   On: 01/11/2015 15:45   Medications: I have reviewed the patient's current medications. Scheduled Meds: . albuterol  2.5 mg Nebulization Once  . aspirin EC  81 mg Oral Daily  . furosemide  40 mg Intravenous BID  . gabapentin  300 mg Oral TID  . heparin  5,000 Units Subcutaneous 3 times per day  . insulin aspart  0-15 Units Subcutaneous TID WC  . insulin aspart  0-5 Units Subcutaneous QHS  . insulin detemir  60 Units Subcutaneous QHS  . pantoprazole  40 mg Oral Daily  . sodium chloride  3 mL Intravenous Q12H   Continuous Infusions:  PRN Meds:.acetaminophen **OR** acetaminophen, dextromethorphan-guaiFENesin **AND** sodium chloride, dicyclomine, technetium TC 7M diethylenetriame-pentaacetic acid Assessment/Plan: Resp distress 2/2 to diastolic CHF exacerbation Slight Creatinine bump today, will decrease aggressive diuresis dosing and follow UOP and Bmet tmrw AM - lasix down to 40 IV BID today - Request RN to carefully record Urine output and check weight consistently - F/U echo performed 9/17  Heart block At this point does not seem to be driving cause, cardiology on board, looks to be second degree type 1. She has remained in stable bradycardia since admission.  AKI Slight worsening with increased dose IV  diuretics still better than admission. -qAM Bmet  Hyperkalemia K stable at 5.0 with high dose lasix. No replacement or treatment indicated at this time. - continue to monitor Bmet  DM II with neuropathy A1c 6.9 at admission  - continue lantus 60 + SSI-M - gabapentin 300mg  TID  GI ppx: pantoprazole 40mg   Diet: Carb modified DVT ppx: [place] Heparin FULL CODE  Dispo: Disposition is deferred at this time, awaiting improvement of current medical problems. Anticipated discharge in approximately 2 day(s).    LOS: 3 days   Collier Salina, MD 01/13/2015, 2:04 PM

## 2015-01-14 ENCOUNTER — Inpatient Hospital Stay (HOSPITAL_COMMUNITY): Payer: Commercial Managed Care - HMO

## 2015-01-14 DIAGNOSIS — I441 Atrioventricular block, second degree: Secondary | ICD-10-CM

## 2015-01-14 LAB — GLUCOSE, CAPILLARY
GLUCOSE-CAPILLARY: 149 mg/dL — AB (ref 65–99)
Glucose-Capillary: 90 mg/dL (ref 65–99)
Glucose-Capillary: 172 mg/dL — ABNORMAL HIGH (ref 65–99)
Glucose-Capillary: 129 mg/dL — ABNORMAL HIGH (ref 65–99)

## 2015-01-14 LAB — BASIC METABOLIC PANEL
Anion gap: 7 (ref 5–15)
BUN: 44 mg/dL — AB (ref 6–20)
CALCIUM: 8.8 mg/dL — AB (ref 8.9–10.3)
CO2: 32 mmol/L (ref 22–32)
CREATININE: 1.48 mg/dL — AB (ref 0.44–1.00)
Chloride: 105 mmol/L (ref 101–111)
GFR calc Af Amer: 42 mL/min — ABNORMAL LOW (ref >60)
GFR, EST NON AFRICAN AMERICAN: 37 mL/min — AB (ref >60)
GLUCOSE: 108 mg/dL — AB (ref 65–99)
Potassium: 4.7 mmol/L (ref 3.5–5.1)
Sodium: 144 mmol/L (ref 135–145)

## 2015-01-14 MED ORDER — ALBUTEROL SULFATE (2.5 MG/3ML) 0.083% IN NEBU
2.5000 mg | INHALATION_SOLUTION | Freq: Once | RESPIRATORY_TRACT | Status: AC
  Administered 2015-01-14: 2.5 mg via RESPIRATORY_TRACT
  Filled 2015-01-14: qty 3

## 2015-01-14 MED ORDER — FUROSEMIDE 10 MG/ML IJ SOLN
60.0000 mg | Freq: Two times a day (BID) | INTRAMUSCULAR | Status: DC
  Administered 2015-01-14 – 2015-01-18 (×9): 60 mg via INTRAVENOUS
  Filled 2015-01-14 (×9): qty 6

## 2015-01-14 MED ORDER — FUROSEMIDE 10 MG/ML IJ SOLN
20.0000 mg | Freq: Once | INTRAMUSCULAR | Status: AC
  Administered 2015-01-14: 20 mg via INTRAVENOUS
  Filled 2015-01-14: qty 2

## 2015-01-14 NOTE — Progress Notes (Signed)
Date: 01/14/2015  Patient name: Kathleen [name]  Medical record number: [id number]  Date of birth: [birth year]   ID: 64yo F with acute diastolic heart failure  S: had episode of feeling  shortness of breath overnight that improved with albuterol inhaler, diuretics, and placed on NRB.A repeat chest xray obtained showing continuation of her pulmonary vascular congestion. On observation, appears HOB < 30% where she usually sleeps on 3 pillows.  No current facility-administered medications on file prior to encounter.   Current Outpatient Prescriptions on File Prior to Encounter  Medication Sig Dispense Refill  . dicyclomine (BENTYL) 20 MG tablet TAKE 1 TABLET (20 MG TOTAL) BY MOUTH 3 (THREE) TIMES DAILY AS NEEDED FOR SPASMS. 90 tablet 1  . furosemide (LASIX) 40 MG tablet Take 1 tablet (40 mg total) by mouth daily. 90 tablet 1  . gabapentin (NEURONTIN) 300 MG capsule Take 1 capsule (300 mg total) by mouth 3 (three) times daily. (Patient taking differently: Take 300 mg by mouth 3 (three) times daily. Three at bedtime) 270 capsule 2  . LEVEMIR 100 UNIT/ML injection INJECT 87 UNITS AT BEDTIME 30 mL 5  . losartan (COZAAR) 50 MG tablet Take 1 tablet (50 mg total) by mouth daily. 90 tablet 3  . metFORMIN (GLUCOPHAGE) 1000 MG tablet TAKE 1 TABLET (1,000 MG TOTAL) BY MOUTH 2 (TWO) TIMES DAILY WITH A MEAL. 180 tablet 3  . omeprazole (PRILOSEC) 20 MG capsule Take 1 capsule (20 mg total) by mouth daily. 90 capsule 1  . ACCU-CHEK FASTCLIX LANCETS MISC TEST THREE TIMES DAILY 306 each 1  . ACCU-CHEK SMARTVIEW test strip TEST BLOOD SUGAR THREE TIMES DAILY 300 each 1  . amitriptyline (ELAVIL) 50 MG tablet TAKE 1/2 TABLET BY MOUTH AT BEDTIME AS NEEDED FOR SLEEP (Patient not taking: Reported on 01/10/2015) 15 tablet 1  . NON FORMULARY Place 2 L into the nose daily. Wears with exertion and qhs     Physical exam: BP 131/109 mmHg  Pulse 46  Temp(Src) 97.8 F (36.6 C) (Oral)  Resp 16  Ht 5\' 1"  (1.549 m)  Wt 317 lb  8 oz (144.017 kg)  BMI 60.02 kg/m2  SpO2 97% GENERAL- pleasant, morbidly obese, NAD HEENT- Atraumatic, PERRL, EOMI, oral mucosa appears moist CARDIAC- RRR, no murmurs, rubs or gallops. RESP- NRB mask in place, distant breath sounds, bibasilar crackles ABDOMEN- Tight, protuberant abdomen, bowel sounds normoactive throughout EXTREMITIES- 1+ pitting pedal edema bilaterally SKIN- Warm, dry, No rash or lesion. PSYCH- Normal mood and affect, appropriate thought content and speech.  Labs: CBC    Component Value Date/Time   WBC 5.8 01/11/2015 0910   RBC 3.76* 01/11/2015 0910   HGB 10.5* 01/11/2015 0910   HCT 36.3 01/11/2015 0910   PLT 156 01/11/2015 0910   MCV 96.5 01/11/2015 0910   MCH 27.9 01/11/2015 0910   MCHC 28.9* 01/11/2015 0910   RDW 16.8* 01/11/2015 0910   LYMPHSABS 1.6 01/10/2015 1744   MONOABS 0.6 01/10/2015 1744   EOSABS 0.2 01/10/2015 1744   BASOSABS 0.0 01/10/2015 1744    BMET    Component Value Date/Time   NA 144 01/14/2015 0624   K 4.7 01/14/2015 0624   CL 105 01/14/2015 0624   CO2 32 01/14/2015 0624   GLUCOSE 108* 01/14/2015 0624   BUN 44* 01/14/2015 0624   CREATININE 1.48* 01/14/2015 0624   CREATININE 0.94 06/07/2014 1027   CALCIUM 8.8* 01/14/2015 0624   GFRNONAA 37* 01/14/2015 0624   GFRNONAA 65 06/07/2014 1027   GFRAA 42*  01/14/2015 0624   GFRAA 75 06/07/2014 1027   Imaging: CXR, i reviewed CXR this morning has ongoing pulmonary vascular congestion, possibly right effusion  Assessment and Plan:  This patient's plan of care was discussed with the house staff. Please see their note for complete details. I concur with their findings.   Resp distress 2/2 to diastolic CHF exacerbation -Creatinine improved today but  Still not at goal with diuresis of 1.5L - lasix to 60mg  IV BID today - Request RN to carefully record Urine output and check weight consistently  Heart block, second degree type 1 --cardiology is following.she remained in stable  bradycardia since admission.  AKI - will check qAM Bmet while getting diuresis  DM II with neuropathy A1c 6.9 at admission. Glucose ranging 90-172 - continue lantus 60 + SSI-M - gabapentin 300mg  TID  Carlyle Basques, MD 01/14/2015, 5:34 PM

## 2015-01-14 NOTE — Care Management Important Message (Signed)
Important Message  Patient Details  Name: Kathleen [name] MRN: [id number] Date of Birth: [birth year]   Medicare Important Message Given:  Yes-second notification given    [name] May 01/14/2015, 4:08 PM

## 2015-01-14 NOTE — Progress Notes (Signed)
Subjective: Developed acute shortness of breath overnight that improved with albuterol inhaler, diuretics, and placed on NRB. Also noted some chills. A repeat chest xray obtained showing continuation of her pulmonary vascular congestion. She was also lying very flat in bed over that night, despite significant known orthopnea. Otherwise patient was tolerating diuresis with good UOP. She had decreased appetite due to sense of fullness in her belly.  Objective: Vital signs in last 24 hours: Filed Vitals:   01/14/15 0740 01/14/15 1200 01/14/15 1602 01/14/15 1634  BP:  106/55 131/109   Pulse:  46 46   Temp: 98.6 F (37 C) 98.4 F (36.9 C) 96.6 F (35.9 C) 97.8 F (36.6 C)  TempSrc: Axillary Oral Oral Oral  Resp:  17 16   Height:      Weight:      SpO2:       Weight change:   Intake/Output Summary (Last 24 hours) at 01/14/15 1706 Last data filed at 01/14/15 1455  Gross per 24 hour  Intake    469 ml  Output   1575 ml  Net  -1106 ml   GENERAL- pleasant, morbidly obese, NAD HEENT- Atraumatic, PERRL, EOMI, oral mucosa appears moist CARDIAC- RRR, no murmurs, rubs or gallops. RESP- NRB mask in place, distant breath sounds, bibasilar crackles ABDOMEN- Tight, venous varicosities, bowel sounds normoactive throughout EXTREMITIES- 2+ pitting pedal edema bilaterally without warm or discoloration SKIN- Warm, dry, No rash or lesion. PSYCH- Normal mood and affect, appropriate thought content and speech.  Lab Results: Basic Metabolic Panel:  Recent Labs Lab 01/10/15 1744  01/13/15 0305 01/14/15 0624  NA 139  < > 143 144  K 5.8*  < > 5.0 4.7  CL 107  < > 105 105  CO2 26  < > 31 32  GLUCOSE 89  < > 135* 108*  BUN 45*  < > 49* 44*  CREATININE 1.90*  < > 1.72* 1.48*  CALCIUM 9.3  < > 8.9 8.8*  MG 2.0  --   --   --   PHOS 3.9  --   --   --   < > = values in this interval not displayed. Liver Function Tests:  Recent Labs Lab 01/10/15 1744  AST 17  ALT 9*  ALKPHOS 112  BILITOT  0.7  PROT 6.2*  ALBUMIN 3.1*   No results for input(s): LIPASE, AMYLASE in the last 168 hours. No results for input(s): AMMONIA in the last 168 hours. CBC:  Recent Labs Lab 01/10/15 1744 01/11/15 0910  WBC 7.3 5.8  NEUTROABS 5.0  --   HGB 11.0* 10.5*  HCT 36.1 36.3  MCV 95.3 96.5  PLT 149* 156   Cardiac Enzymes:  Recent Labs Lab 01/11/15 0306 01/11/15 0910 01/11/15 1515  TROPONINI <0.03 <0.03 <0.03   BNP: No results for input(s): PROBNP in the last 168 hours. D-Dimer:  Recent Labs Lab 01/10/15 2136  DDIMER 2.40*   CBG:  Recent Labs Lab 01/13/15 1212 01/13/15 1646 01/13/15 2036 01/14/15 0739 01/14/15 1159 01/14/15 1630  GLUCAP 145* 141* 135* 90 172* 149*   Hemoglobin A1C:  Recent Labs Lab 01/11/15 1548  HGBA1C 6.9*   Fasting Lipid Panel: No results for input(s): CHOL, HDL, LDLCALC, TRIG, CHOLHDL, LDLDIRECT in the last 168 hours. Thyroid Function Tests:  Recent Labs Lab 01/11/15 0306  TSH 4.466   Coagulation: No results for input(s): LABPROT, INR in the last 168 hours. Anemia Panel: No results for input(s): VITAMINB12, FOLATE, FERRITIN, TIBC, IRON, RETICCTPCT  in the last 168 hours. Urine Drug Screen: Drugs of Abuse  No results found for: LABOPIA, COCAINSCRNUR, LABBENZ, AMPHETMU, THCU, LABBARB  Alcohol Level: No results for input(s): ETH in the last 168 hours. Urinalysis: No results for input(s): COLORURINE, LABSPEC, PHURINE, GLUCOSEU, HGBUR, BILIRUBINUR, KETONESUR, PROTEINUR, UROBILINOGEN, NITRITE, LEUKOCYTESUR in the last 168 hours.  Invalid input(s): APPERANCEUR   Micro Results: Recent Results (from the past 240 hour(s))  MRSA PCR Screening     Status: None   Collection Time: 01/11/15  1:13 AM  Result Value Ref Range Status   MRSA by PCR NEGATIVE NEGATIVE Final    Comment:        The GeneXpert MRSA Assay (FDA approved for NASAL specimens only), is one component of a comprehensive MRSA colonization surveillance program. It is  not intended to diagnose MRSA infection nor to guide or monitor treatment for MRSA infections.    Studies/Results: Dg Chest Port 1 View  01/14/2015   CLINICAL DATA:  Shortness of breath  EXAM: PORTABLE CHEST - 1 VIEW  COMPARISON:  01/10/2015  FINDINGS: Cardiac enlargement with pulmonary vascular congestion. Perihilar infiltration consistent with edema. Left costophrenic angle is not well visualized possibly due to overlying soft tissue attenuation but small pleural effusion not excluded. No pneumothorax. No focal consolidation.  IMPRESSION: Cardiac enlargement with pulmonary vascular congestion and perihilar edema.   Electronically Signed   By: Lucienne Capers M.D.   On: 01/14/2015 04:14   Medications: I have reviewed the patient's current medications. Scheduled Meds: . albuterol  2.5 mg Nebulization Once  . aspirin EC  81 mg Oral Daily  . furosemide  60 mg Intravenous BID  . gabapentin  300 mg Oral TID  . heparin  5,000 Units Subcutaneous 3 times per day  . insulin aspart  0-15 Units Subcutaneous TID WC  . insulin aspart  0-5 Units Subcutaneous QHS  . insulin detemir  60 Units Subcutaneous QHS  . pantoprazole  40 mg Oral Daily  . sodium chloride  3 mL Intravenous Q12H   Continuous Infusions:  PRN Meds:.acetaminophen **OR** acetaminophen, dextromethorphan-guaiFENesin **AND** sodium chloride, dicyclomine, technetium TC 73M diethylenetriame-pentaacetic acid Assessment/Plan: Resp distress 2/2 to diastolic CHF exacerbation Creatinine improved today but not at great diuresis, will adjust and follow up. Echo from 9/17 does not demonstrate significant progression of disease compared to previous study. - lasix to 60mg  IV BID today - Request RN to carefully record Urine output and check weight consistently  Heart block At this point does not seem to be driving cause, cardiology on board, looks to be second degree type 1. She has remained in stable bradycardia since  admission.  AKI Improved today.. -qAM Bmet  Hyperkalemia K currently stable with high dose lasix. No replacement or treatment indicated at this time. - continue to monitor Bmet, replace K as needed  DM II with neuropathy A1c 6.9 at admission. Glucose ranging 90-172 - continue lantus 60 + SSI-M - gabapentin 300mg  TID  GI ppx: pantoprazole 40mg   Diet: Carb modified DVT ppx: [place] Heparin FULL CODE  Dispo: Disposition is deferred at this time, awaiting improvement of current medical problems.   LOS: 4 days   Collier Salina, MD 01/14/2015, 5:06 PM

## 2015-01-14 NOTE — Progress Notes (Signed)
Utilization review complete. Alesia Shavis RN CCM Case Mgmt phone [phone] 

## 2015-01-14 NOTE — Progress Notes (Signed)
Dr.Troung paged and made aware of HR 35-45. Increasing DOE &SOB. Pt using accessory muscles.Bilateral breath sounds with expiratory wheezes noted. New orders received.

## 2015-01-14 NOTE — Progress Notes (Signed)
Patient Name: Kathleen [name] Date of Encounter: 01/14/2015  Active Problems:   Hypertension   Chest pain   Edema   Hyperkalemia   Heart block   Acute on chronic diastolic congestive heart failure   Hypervolemia    SUBJECTIVE  Had rough early morning. Suddenly developed SOB with use of accessory muscle. At that time SpO2 High 90s on 5LNC. HR: High 40s 2nd degree Mobitz Type 1. Given IV lasix 20mg  and albuterol treatment. Cxr showed cardiac enlargement with pulmonary vascular congestion and perihilar edema. EKG showed 2nd degree Mobitz Type 1 at rate of 47. Breathing improved on Venti mask. Currently doing well with stable SOB. No chest pain or palpitations.   CURRENT MEDS . albuterol  2.5 mg Nebulization Once  . aspirin EC  81 mg Oral Daily  . furosemide  60 mg Intravenous BID  . gabapentin  300 mg Oral TID  . heparin  5,000 Units Subcutaneous 3 times per day  . insulin aspart  0-15 Units Subcutaneous TID WC  . insulin aspart  0-5 Units Subcutaneous QHS  . insulin detemir  60 Units Subcutaneous QHS  . pantoprazole  40 mg Oral Daily  . sodium chloride  3 mL Intravenous Q12H    OBJECTIVE  Filed Vitals:   01/14/15 0340 01/14/15 0501 01/14/15 0535 01/14/15 0740  BP: 120/51  128/51   Pulse: 49  35   Temp: 97.7 F (36.5 C)   98.6 F (37 C)  TempSrc: Oral   Axillary  Resp: 22  18   Height:      Weight:      SpO2: 93% 92% 97%     Intake/Output Summary (Last 24 hours) at 01/14/15 0852 Last data filed at 01/14/15 0750  Gross per 24 hour  Intake      3 ml  Output   1650 ml  Net  -1647 ml   Filed Weights   01/11/15 0126 01/12/15 0500 01/13/15 0338  Weight: 307 lb (139.254 kg) 315 lb 8 oz (143.11 kg) 317 lb 8 oz (144.017 kg)    PHYSICAL EXAM  General: Pleasant, NAD. Neuro: Alert and oriented X 3. Moves all extremities spontaneously. Psych: Normal affect. HEENT:  Normal  Neck: Supple without bruits. Difficult assess due to body habituate.  Lungs:  Resp regular and  unlabored, nasal cannula in place. Diminished breath sounds with bibasilar rales.  Heart: RRR no s3, s4, or murmurs. Abdomen: Soft, non-tender, non-distended, BS + x 4.  Extremities: No clubbing, cyanosis. 1-2+ BL LE edema. DP/PT/Radials 2+ and equal bilaterally.  Accessory Clinical Findings  CBC  Recent Labs  01/11/15 0910  WBC 5.8  HGB 10.5*  HCT 36.3  MCV 96.5  PLT [id number]   Basic Metabolic Panel  Recent Labs  01/13/15 0305 01/14/15 0624  NA 143 144  K 5.0 4.7  CL 105 105  CO2 31 32  GLUCOSE 135* 108*  BUN 49* 44*  CREATININE 1.72* 1.48*  CALCIUM 8.9 8.8*   Liver Function Tests No results for input(s): AST, ALT, ALKPHOS, BILITOT, PROT, ALBUMIN in the last 72 hours. No results for input(s): LIPASE, AMYLASE in the last 72 hours. Cardiac Enzymes  Recent Labs  01/11/15 0910 01/11/15 1515  TROPONINI <0.03 <0.03     Recent Labs  01/11/15 1548  HGBA1C 6.9*    TELE  2nd degree Mobitz Type 1 rate in 40-50s  Radiology/Studies  Dg Chest 2 View  01/10/2015   CLINICAL DATA:  Right-sided chest pain and dyspnea  for 1 week  EXAM: CHEST  2 VIEW  COMPARISON:  05/22/2013  FINDINGS: Moderate cardiomegaly. Central and basilar pulmonary edema. No pneumothorax.  IMPRESSION: CHF with central and basilar edema.   Electronically Signed   By: Marybelle Killings M.D.   On: 01/10/2015 18:10   Nm Pulmonary Perf And Vent  01/11/2015   CLINICAL DATA:  Short of breath for four weeks  EXAM: NUCLEAR MEDICINE VENTILATION - PERFUSION LUNG SCAN  TECHNIQUE: Ventilation images were obtained in multiple projections using inhaled aerosol Tc-71m DTPA. Perfusion images were obtained in multiple projections after intravenous injection of Tc-27m MAA.  RADIOPHARMACEUTICALS:  42.69mci Technetium-7m DTPA aerosol inhalation and 6.43mci Technetium-31m MAA IV  COMPARISON:  01/10/15 chest radiograph.  FINDINGS: Ventilation: Heterogenous uptake bilaterally, with less acitivity in the left lung as compared to the  right.  Perfusion: Small subsegmental defect in the posterior lateral right upper lobe. Much larger ventilation defect, with no abnormalities on chest radiograph in this area.  IMPRESSION: Very low probability for PE   Electronically Signed   By: Skipper Cliche M.D.   On: 01/11/2015 15:45   Dg Chest Port 1 View  01/14/2015   CLINICAL DATA:  Shortness of breath  EXAM: PORTABLE CHEST - 1 VIEW  COMPARISON:  01/10/2015  FINDINGS: Cardiac enlargement with pulmonary vascular congestion. Perihilar infiltration consistent with edema. Left costophrenic angle is not well visualized possibly due to overlying soft tissue attenuation but small pleural effusion not excluded. No pneumothorax. No focal consolidation.  IMPRESSION: Cardiac enlargement with pulmonary vascular congestion and perihilar edema.   Electronically Signed   By: Lucienne Capers M.D.   On: 01/14/2015 04:14    ASSESSMENT AND PLAN   1. Acute on chronic diastolic CHF - Most likely multifactorial from dietary indiscretion, chronic nocturnal hypoxemia, weight. - Had SOB early morning, now improved on vent mask.  -  I/Os -819ml/-2.2L. Weigh up 10lb since admission. Doubt accurate.   - She is still volume overloaed and breathing a little better but not back to baseline.  Continue IV Lasix 60mg  BID. Her creatinine has actually come down from 1.9-1.48. - BP stable.    2. Second-degree Heart block type 1 - PR lengthening at times with dropped beats that appears to be Wenkebach.  - EKG with am with 2nd degree HB, type 1.  - She has a history of this on last admission with a very similar EKG. She is not on any AV nodal blocking agents. Avoid. - Consider EP eval  3. Low voltage QRS - This was present on last admission and most likely due to morbid obesity and not pericardial effusion.2D ECHO still shows as "in process".  Called Echo department. Will reply later as they are unsure what's wrong.   4. Acute renal failure - ? Secondary to acute  CHF vs. Diabetic nephropathy - will follow closely with diuresis. Creat improving with diuresis. ARB on hold. Resume once stable  5. DM on metformin - this will need to be held given ARF - treatment per IM  6. Morbid obesity with obesity hypoventilation syndrome and nighttime hypoxemia on home O2 - no OSA on PSG. Needs to be monitor to make sure hypoxemia resolved on O2 at night.  7. HTN - relatively stable  8. Atypical CP that may be related to #1- troponin neg x3.   Signed, Bhagat,Bhavinkumar PA-C  I have personally seen and examined this patient with [employer], PA-C. I agree with the assessment and plan as outlined above. She  is still volume overloaded. She continues to diurese well with IV Lasix. Will need to follow I/O closely. Echo has been done but I cannot view the images or results. Will follow up. I do not think her Mobitz 1 block is contributing to any of her symptoms on presentation. Continue current plan.   MCALHANY,CHRISTOPHER 01/14/2015 11:00 AM

## 2015-01-14 NOTE — Progress Notes (Signed)
Dr Marijean Bravo made aware that CXR has been done. Pt resting more comfortably.

## 2015-01-14 NOTE — Progress Notes (Signed)
I evaluated Kathleen [name] was evaluated at bedside with Dr. Dr. Hulen Luster. She has had good urine output, similar to day prior.  SpO2: High 90s on 5LNC. HR: High 40s 2nd degree Mobitz Type 1 General: No acute distress, smiling Pulmonary: Wheezing, basilar crackles Ext:  2+ pitting edema  Plan:   - One time dose of lasix 20 mg - One time albuterol treatment - Repeat EKG - Follow-up BMET

## 2015-01-15 LAB — GLUCOSE, CAPILLARY
GLUCOSE-CAPILLARY: 96 mg/dL (ref 65–99)
Glucose-Capillary: 150 mg/dL — ABNORMAL HIGH (ref 65–99)
Glucose-Capillary: 94 mg/dL (ref 65–99)
Glucose-Capillary: 129 mg/dL — ABNORMAL HIGH (ref 65–99)

## 2015-01-15 LAB — CBC
HEMATOCRIT: 35.2 % — AB (ref 36.0–46.0)
HEMOGLOBIN: 10.1 g/dL — AB (ref 12.0–15.0)
MCH: 28.1 pg (ref 26.0–34.0)
MCHC: 28.7 g/dL — AB (ref 30.0–36.0)
MCV: 97.8 fL (ref 78.0–100.0)
Platelets: 141 10*3/uL — ABNORMAL LOW (ref 150–400)
RBC: 3.6 MIL/uL — ABNORMAL LOW (ref 3.87–5.11)
RDW: 16.2 % — AB (ref 11.5–15.5)
WBC: 6.4 10*3/uL (ref 4.0–10.5)

## 2015-01-15 LAB — BASIC METABOLIC PANEL
ANION GAP: 9 (ref 5–15)
BUN: 47 mg/dL — AB (ref 6–20)
CALCIUM: 8.9 mg/dL (ref 8.9–10.3)
CO2: 34 mmol/L — AB (ref 22–32)
Chloride: 101 mmol/L (ref 101–111)
Creatinine, Ser: 1.44 mg/dL — ABNORMAL HIGH (ref 0.44–1.00)
GFR calc Af Amer: 44 mL/min — ABNORMAL LOW (ref >60)
GFR calc non Af Amer: 38 mL/min — ABNORMAL LOW (ref >60)
GLUCOSE: 117 mg/dL — AB (ref 65–99)
Potassium: 4.7 mmol/L (ref 3.5–5.1)
Sodium: 144 mmol/L (ref 135–145)

## 2015-01-15 NOTE — Progress Notes (Signed)
Patient Name: Kathleen [name] Date of Encounter: 01/15/2015  Active Problems:   Hypertension   Chest pain   Edema   Hyperkalemia   Heart block   Acute on chronic diastolic congestive heart failure   Hypervolemia   Primary Cardiologist: Dr Acie Fredrickson  Patient Profile: 58 female w/ hx HTN, DM2, IBS, HL, OHS w/ home O2, D-CHF, Mobitz I, atyp CP, admitted 09/15 w/ CHF and bradycardia  SUBJECTIVE: SOB episode yest am, more edema on CXR, Lasix increased  OBJECTIVE Filed Vitals:   01/14/15 2323 01/15/15 0323 01/15/15 0435 01/15/15 0458  BP: 122/41   134/78  Pulse: 43     Temp: 97.8 F (36.6 C)   98.1 F (36.7 C)  TempSrc: Oral   Oral  Resp: 16 15    Height:      Weight:   310 lb 13.6 oz (141 kg)   SpO2: 90%       Intake/Output Summary (Last 24 hours) at 01/15/15 1108 Last data filed at 01/15/15 0947  Gross per 24 hour  Intake    623 ml  Output   2225 ml  Net  -1602 ml   Filed Weights   01/12/15 0500 01/13/15 0338 01/15/15 0435  Weight: 315 lb 8 oz (143.11 kg) 317 lb 8 oz (144.017 kg) 310 lb 13.6 oz (141 kg)    PHYSICAL EXAM General: Well developed, well nourished, female in no acute distress. Head: Normocephalic, atraumatic.  Neck: Supple without bruits, JVD difficult to assess, but is elevated. Lungs:  Resp regular and unlabored, rales bases. Heart: Slightly irregular, S1, S2, no S3, S4, or murmur; no rub. Abdomen: Soft, non-tender, non-distended, BS + x 4.  Extremities: No clubbing, cyanosis, 1+ edema.  Neuro: Alert and oriented X 3. Moves all extremities spontaneously. Psych: Normal affect.  LABS: CBC: Recent Labs  01/15/15 0456  WBC 6.4  HGB 10.1*  HCT 35.2*  MCV 97.8  PLT [id number]*   Basic Metabolic Panel: Recent Labs  01/14/15 0624 01/15/15 0456  NA 144 144  K 4.7 4.7  CL 105 101  CO2 32 34*  GLUCOSE 108* 117*  BUN 44* 47*  CREATININE 1.48* 1.44*  CALCIUM 8.8* 8.9   BNP:  B NATRIURETIC PEPTIDE  Date/Time Value Ref Range Status    01/10/2015 05:44 PM 313.7* 0.0 - 100.0 pg/mL Final    TELE:  In/out Wenkebach      ECHO: 01/12/2015  - Left ventricle: The cavity size was normal. Wall thickness was normal. Systolic function was normal. The estimated ejection fraction was in the range of 55% to 60%. Wall motion was normal; there were no regional wall motion abnormalities. The study is not technically sufficient to allow evaluation of LV diastolic function. - Left atrium: The atrium was mildly dilated. - Right atrium: The atrium was mildly dilated. Impressions: - Normal LV function; mild biatrial enlargement; trace MR and TR.  Radiology/Studies: Dg Chest Port 1 View  01/14/2015   CLINICAL DATA:  Shortness of breath  EXAM: PORTABLE CHEST - 1 VIEW  COMPARISON:  01/10/2015  FINDINGS: Cardiac enlargement with pulmonary vascular congestion. Perihilar infiltration consistent with edema. Left costophrenic angle is not well visualized possibly due to overlying soft tissue attenuation but small pleural effusion not excluded. No pneumothorax. No focal consolidation.  IMPRESSION: Cardiac enlargement with pulmonary vascular congestion and perihilar edema.   Electronically Signed   By: Lucienne Capers M.D.   On: 01/14/2015 04:14     Current Medications:  .  albuterol  2.5 mg Nebulization Once  . aspirin EC  81 mg Oral Daily  . furosemide  60 mg Intravenous BID  . gabapentin  300 mg Oral TID  . heparin  5,000 Units Subcutaneous 3 times per day  . insulin aspart  0-15 Units Subcutaneous TID WC  . insulin aspart  0-5 Units Subcutaneous QHS  . insulin detemir  60 Units Subcutaneous QHS  . pantoprazole  40 mg Oral Daily  . sodium chloride  3 mL Intravenous Q12H      ASSESSMENT AND PLAN:  1. Acute on chronic diastolic CHF - Most likely multifactorial from dietary indiscretion, chronic nocturnal hypoxemia, weight. - Had SOB early morning, now improved on vent mask.  - I/Os -1602 ml 24 hr, -3.7L since admit.  -  Weight 307>>317 (09/18) >> 310 today.  June 2016 wt 280 lbs  - She is still volume overloaded and breathing a little better but not back to baseline. Continue IV Lasix 60mg  BID. Her creatinine has actually come down from 1.9->1.44. - BP stable.   2. Second-degree Heart block type 1  - PR lengthening at times with dropped beats, Wenkebach.  - She has a history of this on last admission with a very similar EKG. She is not on any AV nodal blocking agents. Avoid. - follow  3. Low voltage QRS - This was present on last admission and most likely due to morbid obesity and not pericardial effusion.2D ECHO w/ no effusion, u/a to assess diastolic dysfunction  4. Acute renal failure - BUN/Cr 45/1.90 on admit - ? Secondary to acute CHF vs. Diabetic nephropathy  -  follow closely with diuresis.  - Creat improving with diuresis but BUN trending up.  - ARB on hold. Resume once stable  5. DM on metformin - this will need to be held given ARF - treatment per IM  6. Morbid obesity with obesity hypoventilation syndrome and nighttime hypoxemia on home O2 - no OSA on PSG. Needs to be monitor to make sure hypoxemia resolved on O2 at night.  7. HTN - relatively stable, per IM  8. Atypical CP that may be related to #1- troponin neg x3. No further eval planned  Active Problems:   Hypertension   Chest pain   Edema   Hyperkalemia   Heart block   Acute on chronic diastolic congestive heart failure   Hypervolemia   Signed, Barrett, Rhonda , PA-C 11:08 AM 01/15/2015  Patient seen, examined. Available data reviewed. Agree with findings, assessment, and plan as outlined by Rosaria Ferries, PA-C. The patient is independent all he interviewed and examined. She has acute on chronic diastolic heart failure complicated by obesity hypoventilation syndrome. She appears to be making progress with diuresis, now that 4 L negative since admission. Would continue furosemide 60 mg twice daily  intravenously. The patient needs major lifestyle modification order to prevent recurrent heart failure episodes. She was counseled extensively regarding the need for sodium restriction and weight loss.  Sherren Mocha, M.D. 01/15/2015 1:01 PM

## 2015-01-15 NOTE — Progress Notes (Signed)
Date: 01/15/2015  Patient name: Kathleen [name]  Medical record number: [id number]  Date of birth: March 27, 1951   ID: 64yo F with OHS presents with DOE, acute diastolic heart failure  S: had episode of feeling  shortness of breath this morning did well last night. On venti mask, she complains of nasal congestion, unable to breath with nasal cannula. Continues to feel less tightness in legs and abdomen  O: net 1800L UOP  . albuterol  2.5 mg Nebulization Once  . aspirin EC  81 mg Oral Daily  . furosemide  60 mg Intravenous BID  . gabapentin  300 mg Oral TID  . heparin  5,000 Units Subcutaneous 3 times per day  . insulin aspart  0-15 Units Subcutaneous TID WC  . insulin aspart  0-5 Units Subcutaneous QHS  . insulin detemir  60 Units Subcutaneous QHS  . pantoprazole  40 mg Oral Daily  . sodium chloride  3 mL Intravenous Q12H   Physical exam: BP 134/78 mmHg  Pulse 43  Temp(Src) 98.1 F (36.7 C) (Oral)  Resp 15  Ht 5\' 1"  (1.549 m)  Wt 310 lb 13.6 oz (141 kg)  BMI 58.76 kg/m2  SpO2 90% GENERAL- pleasant, morbidly obese, NAD wearing ventimask HEENT- Atraumatic, PERRL, EOMI, oral mucosa appears moist CARDIAC- RRR, no murmurs, rubs or gallops. RESP- NRB mask in place, distant breath sounds, bibasilar crackles ABDOMEN- pitting edema to abd wall, protuberant abdomen, bowel sounds normoactive throughout EXTREMITIES- trace pitting pedal edema bilaterally SKIN- Warm, dry, No rash or lesion. PSYCH- Normal mood and affect, appropriate thought content and speech.  Labs: CBC    Component Value Date/Time   WBC 6.4 01/15/2015 0456   RBC 3.60* 01/15/2015 0456   HGB 10.1* 01/15/2015 0456   HCT 35.2* 01/15/2015 0456   PLT 141* 01/15/2015 0456   MCV 97.8 01/15/2015 0456   MCH 28.1 01/15/2015 0456   MCHC 28.7* 01/15/2015 0456   RDW 16.2* 01/15/2015 0456   LYMPHSABS 1.6 01/10/2015 1744   MONOABS 0.6 01/10/2015 1744   EOSABS 0.2 01/10/2015 1744   BASOSABS 0.0 01/10/2015 1744    BMET      Component Value Date/Time   NA 144 01/15/2015 0456   K 4.7 01/15/2015 0456   CL 101 01/15/2015 0456   CO2 34* 01/15/2015 0456   GLUCOSE 117* 01/15/2015 0456   BUN 47* 01/15/2015 0456   CREATININE 1.44* 01/15/2015 0456   CREATININE 0.94 06/07/2014 1027   CALCIUM 8.9 01/15/2015 0456   GFRNONAA 38* 01/15/2015 0456   GFRNONAA 65 06/07/2014 1027   GFRAA 44* 01/15/2015 0456   GFRAA 75 06/07/2014 1027    Assessment and Plan:  This patient's plan of care was discussed with the house staff. Please see their note for complete details. I concur with their findings.   Resp distress 2/2 to diastolic CHF exacerbation -Creatinine improved today with current diuresis of goal of 1.5-2L per day - will continue with lasix to 60mg  IV BID - appreciate recs from cardiology - still far from goal, she is up #30 over the last 4 months, thus far diuresed equivalent of 8-9 lbs.  Heart block, second degree type 1, wenkebach --cardiology is following.she remained in stable bradycardia since admission. - per recs, will avoid av nodal blockers  AKI - improving slightly with current diuresis - will continue to follow bmp to assess if any contraction alkalosis  DM II with neuropathy A1c 6.9 at admission. Glucose ranging 90-172 - continue lantus 60 + SSI-M -  gabapentin 300mg  TID  Carlyle Basques, MD 01/15/2015, 4:22 PM

## 2015-01-15 NOTE — Progress Notes (Signed)
    See other note this same date.  Sherren Mocha, M.D. 01/15/2015, 11:27 AM

## 2015-01-15 NOTE — Progress Notes (Signed)
Subjective: Breathing did worsen again overnight, was placed back to NRB. Otherwise tolerating treatment well. Good UOP on diuresis, feels less tightness in her legs and abdomen but still much worse from baseline. Tolerating diet better today.  Objective: Vital signs in last 24 hours: Filed Vitals:   01/14/15 2323 01/15/15 0323 01/15/15 0435 01/15/15 0458  BP: 122/41   134/78  Pulse: 43     Temp: 97.8 F (36.6 C)   98.1 F (36.7 C)  TempSrc: Oral   Oral  Resp: 16 15    Height:      Weight:   141 kg (310 lb 13.6 oz)   SpO2: 90%      Weight change:   Intake/Output Summary (Last 24 hours) at 01/15/15 1407 Last data filed at 01/15/15 0947  Gross per 24 hour  Intake    623 ml  Output   2225 ml  Net  -1602 ml   GENERAL- pleasant, morbidly obese, NAD HEENT- Atraumatic, PERRL, EOMI, oral mucosa appears moist CARDIAC- RRR, no murmurs, rubs or gallops. RESP- NRB mask in place, distant breath sounds, diffuse inspiratory crackles ABDOMEN- Form, nontender, venous varicosities, bowel sounds normoactive throughout EXTREMITIES- 2+ pitting pedal edema bilaterally without warmth or discoloration SKIN- Warm, dry, No rash or lesion. PSYCH- Normal mood and affect, appropriate thought content and speech.  Lab Results: Basic Metabolic Panel:  Recent Labs Lab 01/10/15 1744  01/14/15 0624 01/15/15 0456  NA 139  < > 144 144  K 5.8*  < > 4.7 4.7  CL 107  < > 105 101  CO2 26  < > 32 34*  GLUCOSE 89  < > 108* 117*  BUN 45*  < > 44* 47*  CREATININE 1.90*  < > 1.48* 1.44*  CALCIUM 9.3  < > 8.8* 8.9  MG 2.0  --   --   --   PHOS 3.9  --   --   --   < > = values in this interval not displayed. Liver Function Tests:  Recent Labs Lab 01/10/15 1744  AST 17  ALT 9*  ALKPHOS 112  BILITOT 0.7  PROT 6.2*  ALBUMIN 3.1*   No results for input(s): LIPASE, AMYLASE in the last 168 hours. No results for input(s): AMMONIA in the last 168 hours. CBC:  Recent Labs Lab 01/10/15 1744  01/11/15 0910 01/15/15 0456  WBC 7.3 5.8 6.4  NEUTROABS 5.0  --   --   HGB 11.0* 10.5* 10.1*  HCT 36.1 36.3 35.2*  MCV 95.3 96.5 97.8  PLT 149* 156 141*   Cardiac Enzymes:  Recent Labs Lab 01/11/15 0306 01/11/15 0910 01/11/15 1515  TROPONINI <0.03 <0.03 <0.03   BNP: No results for input(s): PROBNP in the last 168 hours. D-Dimer:  Recent Labs Lab 01/10/15 2136  DDIMER 2.40*   CBG:  Recent Labs Lab 01/14/15 0739 01/14/15 1159 01/14/15 1630 01/14/15 2033 01/15/15 0729 01/15/15 1143  GLUCAP 90 172* 149* 129* 96 150*   Hemoglobin A1C:  Recent Labs Lab 01/11/15 1548  HGBA1C 6.9*   Fasting Lipid Panel: No results for input(s): CHOL, HDL, LDLCALC, TRIG, CHOLHDL, LDLDIRECT in the last 168 hours. Thyroid Function Tests:  Recent Labs Lab 01/11/15 0306  TSH 4.466   Coagulation: No results for input(s): LABPROT, INR in the last 168 hours. Anemia Panel: No results for input(s): VITAMINB12, FOLATE, FERRITIN, TIBC, IRON, RETICCTPCT in the last 168 hours. Urine Drug Screen: Drugs of Abuse  No results found for: LABOPIA, COCAINSCRNUR, [place], AMPHETMU,  THCU, LABBARB  Alcohol Level: No results for input(s): ETH in the last 168 hours. Urinalysis: No results for input(s): COLORURINE, LABSPEC, PHURINE, GLUCOSEU, HGBUR, BILIRUBINUR, KETONESUR, PROTEINUR, UROBILINOGEN, NITRITE, LEUKOCYTESUR in the last 168 hours.  Invalid input(s): APPERANCEUR   Micro Results: Recent Results (from the past 240 hour(s))  MRSA PCR Screening     Status: None   Collection Time: 01/11/15  1:13 AM  Result Value Ref Range Status   MRSA by PCR NEGATIVE NEGATIVE Final    Comment:        The GeneXpert MRSA Assay (FDA approved for NASAL specimens only), is one component of a comprehensive MRSA colonization surveillance program. It is not intended to diagnose MRSA infection nor to guide or monitor treatment for MRSA infections.    Studies/Results: Dg Chest Port 1  View  01/14/2015   CLINICAL DATA:  Shortness of breath  EXAM: PORTABLE CHEST - 1 VIEW  COMPARISON:  01/10/2015  FINDINGS: Cardiac enlargement with pulmonary vascular congestion. Perihilar infiltration consistent with edema. Left costophrenic angle is not well visualized possibly due to overlying soft tissue attenuation but small pleural effusion not excluded. No pneumothorax. No focal consolidation.  IMPRESSION: Cardiac enlargement with pulmonary vascular congestion and perihilar edema.   Electronically Signed   By: Lucienne Capers M.D.   On: 01/14/2015 04:14   Medications: I have reviewed the patient's current medications. Scheduled Meds: . albuterol  2.5 mg Nebulization Once  . aspirin EC  81 mg Oral Daily  . furosemide  60 mg Intravenous BID  . gabapentin  300 mg Oral TID  . heparin  5,000 Units Subcutaneous 3 times per day  . insulin aspart  0-15 Units Subcutaneous TID WC  . insulin aspart  0-5 Units Subcutaneous QHS  . insulin detemir  60 Units Subcutaneous QHS  . pantoprazole  40 mg Oral Daily  . sodium chloride  3 mL Intravenous Q12H   Continuous Infusions:  PRN Meds:.acetaminophen **OR** acetaminophen, dextromethorphan-guaiFENesin **AND** sodium chloride, dicyclomine, technetium TC 13M diethylenetriame-pentaacetic acid Assessment/Plan: Resp distress 2/2 to diastolic CHF exacerbation Creatinine improved today and diuresis pretty good at -1.8L reported. She does continue to worsen intermittently overnight, maybe postural component. No history of OSA, has PSG that does not show significant apnea. Will maintain HOB elevation, and may also be restrictive process 2/2 obesity. Edematous crackles may also be masking a component of atelectasis, should improve further with positional change possibly incentive spirometry. - continue lasix 60mg  IV BID today - Request RN to carefully record Urine output and check weight consistently - PT to eval/treat, initial goal transfer to bedside chair for  periods - Wean to O2 by [place] if she can tolerate  Heart block At this point does not seem to be driving cause, repeat EKG again consistent, looks to be second degree type 1. She has remained in stable bradycardia since admission.  AKI Improved today. -qAM Bmet  Hyperkalemia K currently stable with high dose lasix. No replacement or treatment indicated at this time. - continue to monitor Bmet, replace K as needed  DM II with neuropathy A1c 6.9 at admission. Glucose in controlled range. - continue lantus 60 + SSI-M - gabapentin 300mg  TID  GI ppx: pantoprazole 40mg   Diet: Carb modified DVT ppx: [place] Heparin FULL CODE  Dispo: Disposition is deferred at this time, awaiting improvement of current medical problems. Patient appropriate for dischar   LOS: 5 days   Collier Salina, MD 01/15/2015, 2:07 PM

## 2015-01-16 LAB — BASIC METABOLIC PANEL
Anion gap: 4 — ABNORMAL LOW (ref 5–15)
Anion gap: 6 (ref 5–15)
BUN: 45 mg/dL — AB (ref 6–20)
BUN: 45 mg/dL — AB (ref 6–20)
CALCIUM: 9.1 mg/dL (ref 8.9–10.3)
CALCIUM: 8.9 mg/dL (ref 8.9–10.3)
CO2: 40 mmol/L — ABNORMAL HIGH (ref 22–32)
CO2: 35 mmol/L — AB (ref 22–32)
CREATININE: 1.39 mg/dL — AB (ref 0.44–1.00)
CREATININE: 1.34 mg/dL — AB (ref 0.44–1.00)
Chloride: 101 mmol/L (ref 101–111)
Chloride: 103 mmol/L (ref 101–111)
GFR calc Af Amer: 46 mL/min — ABNORMAL LOW (ref >60)
GFR calc Af Amer: 48 mL/min — ABNORMAL LOW (ref >60)
GFR, EST NON AFRICAN AMERICAN: 39 mL/min — AB (ref >60)
GFR, EST NON AFRICAN AMERICAN: 41 mL/min — AB (ref >60)
GLUCOSE: 78 mg/dL (ref 65–99)
GLUCOSE: 163 mg/dL — AB (ref 65–99)
POTASSIUM: 4.4 mmol/L (ref 3.5–5.1)
Potassium: 4.5 mmol/L (ref 3.5–5.1)
SODIUM: 145 mmol/L (ref 135–145)
Sodium: 144 mmol/L (ref 135–145)

## 2015-01-16 LAB — GLUCOSE, CAPILLARY
GLUCOSE-CAPILLARY: 71 mg/dL (ref 65–99)
GLUCOSE-CAPILLARY: 118 mg/dL — AB (ref 65–99)
GLUCOSE-CAPILLARY: 143 mg/dL — AB (ref 65–99)
Glucose-Capillary: 180 mg/dL — ABNORMAL HIGH (ref 65–99)

## 2015-01-16 MED ORDER — FUROSEMIDE 10 MG/ML IJ SOLN
INTRAMUSCULAR | Status: AC
  Filled 2015-01-16: qty 2

## 2015-01-16 MED ORDER — FUROSEMIDE 10 MG/ML IJ SOLN
20.0000 mg | Freq: Once | INTRAMUSCULAR | Status: AC
  Administered 2015-01-16: 20 mg via INTRAVENOUS

## 2015-01-16 NOTE — Progress Notes (Signed)
Patient currently sleeping with Venti mask at 14L in place. O2 sats 88-90%.Does not appear in distress. Is currently completely on her left side which could be contributing. Assessed lungs and fine crackles heard in right lung base. Notified IM Resident and will continue to monitor.

## 2015-01-16 NOTE — Progress Notes (Signed)
CSW Armed forces technical officer) spoke with pt at bedside about PT recommendation. Pt politely declining SNF at this time and states she will not be comfortable away from home. Pt informed CSW she is still open to home health and agreeable to [facility] as well. Pt plans to ask her sister for increased assistance as needed. CSW notified RNCM of conversation. Pt may need help with transportation home, but CSW will sign off and assist with this if needed.  [place], [place]

## 2015-01-16 NOTE — Progress Notes (Signed)
Date: 01/16/2015  Patient name: Kathleen [name]  Medical record number: [id number]  Date of birth: 1950/11/15   ID: 64yo F with OHS presents with DOE, acute diastolic heart failure  S: still having intermitten shortness of breath last night and this morning, using venti mask. Still has some complaint of nasal congestion, unable to breath with nasal cannula. Continues to feel better, with  less tightness in legs and abdomen  O: net 5# down from admit  . albuterol  2.5 mg Nebulization Once  . aspirin EC  81 mg Oral Daily  . furosemide  60 mg Intravenous BID  . gabapentin  300 mg Oral TID  . heparin  5,000 Units Subcutaneous 3 times per day  . insulin aspart  0-15 Units Subcutaneous TID WC  . insulin aspart  0-5 Units Subcutaneous QHS  . insulin detemir  60 Units Subcutaneous QHS  . pantoprazole  40 mg Oral Daily  . sodium chloride  3 mL Intravenous Q12H   Physical exam: BP 128/51 mmHg  Pulse 58  Temp(Src) 98.1 F (36.7 C) (Oral)  Resp 17  Ht 5\' 1"  (1.549 m)  Wt 305 lb 12.5 oz (138.7 kg)  BMI 57.81 kg/m2  SpO2 92% GENERAL- pleasant, morbidly obese, NAD wearing ventimask HEENT- Atraumatic, PERRL, EOMI, oral mucosa appears moist CARDIAC- RRR, no murmurs, rubs or gallops. RESP- NRB mask in place, distant breath sounds, bibasilar crackles ABDOMEN- pitting edema to abd wall, protuberant abdomen, bowel sounds normoactive throughout EXTREMITIES- trace pitting pedal edema bilaterally SKIN- Warm, dry, No rash or lesion. PSYCH- Normal mood and affect, appropriate thought content and speech.  Labs: CBC    Component Value Date/Time   WBC 6.4 01/15/2015 0456   RBC 3.60* 01/15/2015 0456   HGB 10.1* 01/15/2015 0456   HCT 35.2* 01/15/2015 0456   PLT 141* 01/15/2015 0456   MCV 97.8 01/15/2015 0456   MCH 28.1 01/15/2015 0456   MCHC 28.7* 01/15/2015 0456   RDW 16.2* 01/15/2015 0456   LYMPHSABS 1.6 01/10/2015 1744   MONOABS 0.6 01/10/2015 1744   EOSABS 0.2 01/10/2015 1744   BASOSABS  0.0 01/10/2015 1744    BMET    Component Value Date/Time   NA 144 01/16/2015 0933   K 4.5 01/16/2015 0933   CL 103 01/16/2015 0933   CO2 35* 01/16/2015 0933   GLUCOSE 163* 01/16/2015 0933   BUN 45* 01/16/2015 0933   CREATININE 1.34* 01/16/2015 0933   CREATININE 0.94 06/07/2014 1027   CALCIUM 8.9 01/16/2015 0933   GFRNONAA 41* 01/16/2015 0933   GFRNONAA 65 06/07/2014 1027   GFRAA 48* 01/16/2015 0933   GFRAA 75 06/07/2014 1027    Assessment and Plan:  This patient's plan of care was discussed with the house staff. Please see their note for complete details. I concur with their findings.   Resp distress 2/2 to diastolic CHF exacerbation -Creatinine continues to improved today with current diuresis of goal of 1.5-2L per day - will continue with lasix to 60mg  IV BID - appreciate recs from cardiology today, in agreement with continuing to do diuresis  Heart block, second degree type 1, wenkebach --cardiology is following.she remained in stable bradycardia since admission. - per recs, will avoid av nodal blockers  Hypoxemia, thought to be due to OHS - will do trial of cpap tonite to see if any improvement with dyspnea AKI - improving slightly with current diuresis - will continue to follow bmp to assess if any contraction alkalosis  DM II with neuropathy A1c  6.9 at admission. Glucose ranging 90-172 - continue lantus 60 + SSI-M - gabapentin 300mg  TID  Carlyle Basques, MD 01/16/2015, 7:45 PM

## 2015-01-16 NOTE — Evaluation (Addendum)
Physical Therapy Evaluation Patient Details Name: Kathleen [name] MRN: [id number] DOB: [birth year] Today's Date: 01/16/2015   History of Present Illness  patient is a 64 y/o female with PMH of morbid obesity, DM, HTN, HLD, IBS, Mobitz1 heart block, chronic dCHF who p/w worsening SOB/DOE, weight gain over the last 3 weeks. Patient states she is compliant with her meds and limits her PO fluid intake. She noted SOB/DOE beginning 3 weeks ago which has gradually progressed and she is dyspneic at rest now. She c/o orthopnea as well and is unable to lie flat. She noted worsening weight gain and LE edema.   Clinical Impression  Pt admitted with above diagnosis. Pt currently with functional limitations due to the deficits listed below (see PT Problem List).  Pt will benefit from skilled PT to increase their independence and safety with mobility to allow discharge to the venue listed below.  Pt limited at eval to SPT due to fatigue.  This was first time pt has been up in 6 days.  If pt progresses well enough during acute stay, then she may be ok for home with HHPT, but at this time recommend SNF due to cardiopulmonary status, limited support and multiple steps to enter home.     Follow Up Recommendations SNF;Supervision for mobility/OOB    Equipment Recommendations  None recommended by PT    Recommendations for Other Services OT consult     Precautions / Restrictions Precautions Precautions: Fall Restrictions Weight Bearing Restrictions: No      Mobility  Bed Mobility Overal bed mobility: Needs Assistance Bed Mobility: Supine to Sit     Supine to sit: Min guard;HOB elevated     General bed mobility comments: HOB elevated and with heavy use of rail. Discussed plan to get pt to [facility] and transferred to EOB to get to [facility] and then pt stated that she didn't want to use the [facility] cause it was too small.  transferred back to supine and pulled up in bed with  heavy use of rails and o2 dropped to 85%.   After using bed pan returned to EOB again.  Transfers Overall transfer level: Needs assistance Equipment used: Rolling walker (2 wheeled) Transfers: Sit to/from [facility] Sit to Stand: Min assist Stand pivot transfers: Min assist       General transfer comment: Pt took several small steps to recliner with RW with c/o not being able to really tell where her feet were.  C/o some dizziness and lightheadedness. Pt with venti mask on.  Ambulation/Gait Ambulation/Gait assistance: Min assist Ambulation Distance (Feet): 2 Feet Assistive device: Rolling walker (2 wheeled)       General Gait Details: A few small steps to recliner with RW  Stairs            Wheelchair Mobility    Modified Rankin (Stroke Patients Only)       Balance Overall balance assessment: Needs assistance Sitting-balance support: Feet supported;No upper extremity supported Sitting balance-Leahy Scale: Good     Standing balance support: Bilateral upper extremity supported Standing balance-Leahy Scale: Poor                               Pertinent Vitals/Pain Pain Assessment: 0-10 Pain Score: 9  Pain Location: Gas pain Pain Descriptors / Indicators: Sore Pain Intervention(s): Other (comment) (bed pan)    Home Living Family/patient expects to be discharged to:: Private residence Living Arrangements: Alone Available Help  at Discharge: Family;Available PRN/intermittently Type of Home: Mobile home Home Access: Stairs to enter Entrance Stairs-Rails: Can reach both Entrance Stairs-Number of Steps: long ramp type entry with 15 steps with walking space in between them. Home Layout: One level Home Equipment: Cane - single point;Walker - 2 wheels;Shower seat - built in      Prior Function   Modified independent occasional use of RW and cane.  Pt had difficulty clarifying.              Hand Dominance        Extremity/Trunk Assessment   Upper Extremity  Assessment: Overall WFL for tasks assessed           Lower Extremity Assessment: Overall WFL for tasks assessed;Generalized weakness         Communication   Communication: No difficulties  Cognition Arousal/Alertness: Awake/alert Behavior During Therapy: WFL for tasks assessed/performed Overall Cognitive Status: Within Functional Limits for tasks assessed                      General Comments General comments (skin integrity, edema, etc.): Pt with ppor activity tolerance. O2 dropped to 85% with re-positioning in bed and 90% with SPT.      Exercises        Assessment/Plan    PT Assessment Patient needs continued PT services  PT Diagnosis Generalized weakness;Difficulty walking   PT Problem List Decreased activity tolerance;Decreased balance;Decreased mobility;Decreased strength;Cardiopulmonary status limiting activity  PT Treatment Interventions Gait training;Functional mobility training;Therapeutic activities;Therapeutic exercise;Stair training;DME instruction;Balance training   PT Goals (Current goals can be found in the Care Plan section) Acute Rehab PT Goals Patient Stated Goal: Get out of bed PT Goal Formulation: With patient Time For Goal Achievement: 01/30/15 Potential to Achieve Goals: Good    Frequency Min 3X/week   Barriers to discharge        Co-evaluation               End of Session Equipment Utilized During Treatment: Gait belt;Oxygen Activity Tolerance: Patient limited by fatigue Patient left: in chair;with call bell/phone within reach;Other (comment) (with MD present) Nurse Communication: Mobility status         Time: [phone] (no charge for time on bed pan)-0941 PT Time Calculation (min) (ACUTE ONLY): 44 min   Charges:   PT Evaluation $Initial PT Evaluation Tier I: 1 Procedure PT Treatments $Therapeutic Activity: 8-22 mins   PT G Codes:        SMITH,KAREN LUBECK 01/16/2015, 10:39 AM

## 2015-01-16 NOTE — Progress Notes (Signed)
Patient Name: Kathleen [name] Date of Encounter: 01/16/2015  Active Problems:  Hypertension  Chest pain  Edema  Hyperkalemia  Heart block  Acute on chronic diastolic congestive heart failure  Hypervolemia   Primary Cardiologist: Dr Acie Fredrickson  Patient Profile: 16 female w/ hx HTN, DM2, IBS, HL, OHS w/ home O2, D-CHF, Mobitz I, atyp CP, admitted 09/15 w/ CHF and bradycardia.  SUBJECTIVE  Slept well last night. Breathing improved. No CP or palpitation. Not able to measure urine output accurately due to contaminated with BM.   CURRENT MEDS . albuterol  2.5 mg Nebulization Once  . aspirin EC  81 mg Oral Daily  . furosemide  60 mg Intravenous BID  . gabapentin  300 mg Oral TID  . heparin  5,000 Units Subcutaneous 3 times per day  . insulin aspart  0-15 Units Subcutaneous TID WC  . insulin aspart  0-5 Units Subcutaneous QHS  . insulin detemir  60 Units Subcutaneous QHS  . pantoprazole  40 mg Oral Daily  . sodium chloride  3 mL Intravenous Q12H    OBJECTIVE  Filed Vitals:   01/15/15 2100 01/16/15 0000 01/16/15 0344 01/16/15 0400  BP: 135/39 110/46 110/60 128/51  Pulse: 57 43 52 58  Temp: 97.7 F (36.5 C) 98.7 F (37.1 C) 98.1 F (36.7 C)   TempSrc: Oral Tympanic Oral   Resp: 15 14 19 17   Height:      Weight:   305 lb 12.5 oz (138.7 kg)   SpO2: 95% 96% 90% 92%    Intake/Output Summary (Last 24 hours) at 01/16/15 0942 Last data filed at 01/16/15 0824  Gross per 24 hour  Intake    473 ml  Output    400 ml  Net     73 ml   Filed Weights   01/13/15 0338 01/15/15 0435 01/16/15 0344  Weight: 317 lb 8 oz (144.017 kg) 310 lb 13.6 oz (141 kg) 305 lb 12.5 oz (138.7 kg)    PHYSICAL EXAM  General: Pleasant, NAD. Vent mask on. Sitting in chair.  Neuro: Alert and oriented X 3. Moves all extremities spontaneously. Psych: Normal affect. HEENT:  Normal  Neck: Supple without bruits or JVD. Lungs:  Resp regular and unlabored. Diminished breath sound bibasilar.  Heart:  RRR no s3, s4, or murmurs. Abdomen: Soft, non-tender, non-distended, BS + x 4.  Extremities: No clubbing, cyanosis. 1+ BL LE edema. DP/PT/Radials 2+ and equal bilaterally.  Accessory Clinical Findings  CBC  Recent Labs  01/15/15 0456  WBC 6.4  HGB 10.1*  HCT 35.2*  MCV 97.8  PLT [id number]*   Basic Metabolic Panel  Recent Labs  01/15/15 0456 01/16/15 0807  NA 144 145  K 4.7 4.4  CL 101 101  CO2 34* 40*  GLUCOSE 117* 78  BUN 47* 45*  CREATININE 1.44* 1.39*  CALCIUM 8.9 9.1    TELE  NSR  Radiology/Studies  Dg Chest 2 View  01/10/2015   CLINICAL DATA:  Right-sided chest pain and dyspnea for 1 week  EXAM: CHEST  2 VIEW  COMPARISON:  05/22/2013  FINDINGS: Moderate cardiomegaly. Central and basilar pulmonary edema. No pneumothorax.  IMPRESSION: CHF with central and basilar edema.   Electronically Signed   By: Marybelle Killings M.D.   On: 01/10/2015 18:10   Nm Pulmonary Perf And Vent  01/11/2015   CLINICAL DATA:  Short of breath for four weeks  EXAM: NUCLEAR MEDICINE VENTILATION - PERFUSION LUNG SCAN  TECHNIQUE: Ventilation images were obtained in  multiple projections using inhaled aerosol Tc-90m DTPA. Perfusion images were obtained in multiple projections after intravenous injection of Tc-57m MAA.  RADIOPHARMACEUTICALS:  42.34mci Technetium-60m DTPA aerosol inhalation and 6.58mci Technetium-88m MAA IV  COMPARISON:  01/10/15 chest radiograph.  FINDINGS: Ventilation: Heterogenous uptake bilaterally, with less acitivity in the left lung as compared to the right.  Perfusion: Small subsegmental defect in the posterior lateral right upper lobe. Much larger ventilation defect, with no abnormalities on chest radiograph in this area.  IMPRESSION: Very low probability for PE   Electronically Signed   By: Skipper Cliche M.D.   On: 01/11/2015 15:45   Dg Chest Port 1 View  01/14/2015   CLINICAL DATA:  Shortness of breath  EXAM: PORTABLE CHEST - 1 VIEW  COMPARISON:  01/10/2015  FINDINGS: Cardiac  enlargement with pulmonary vascular congestion. Perihilar infiltration consistent with edema. Left costophrenic angle is not well visualized possibly due to overlying soft tissue attenuation but small pleural effusion not excluded. No pneumothorax. No focal consolidation.  IMPRESSION: Cardiac enlargement with pulmonary vascular congestion and perihilar edema.   Electronically Signed   By: Lucienne Capers M.D.   On: 01/14/2015 04:14    ASSESSMENT AND PLAN   1. Acute on chronic diastolic CHF - Most likely multifactorial from dietary indiscretion, chronic nocturnal hypoxemia, weight.  -Breathing improved. Diuresed ~4L so far. Unable to measure all urine output duet to contaminated with BM last night and this AM.  - I/Os -862ml/-2.2L. Weigh down 5lb since yesterday. Overall breathing improved.  - Continue IV Lasix 60mg  BID. Her creatinine improved further to 1.39. - BP stable.   2. Second-degree Heart block type 1 - PR lengthening at times with dropped beats that appears to be Wenkebach.  - She has a history of this on last admission with a very similar EKG. Avoid AV nodal blocking agents.   3. Low voltage QRS - This was present on last admission and most likely due to morbid obesity and not pericardial effusion.2D ECHO with normal LV function, w/ no effusion, u/a to assess diastolic dysfunction.  4. Acute renal failure - ? Secondary to acute CHF vs. Diabetic nephropathy - will follow closely with diuresis. Creat improving with diuresis. ARB on hold. Resume once stable   5. DM on metformin - Treatment per IM   6. Morbid obesity with obesity hypoventilation syndrome and nighttime hypoxemia on home O2 - no OSA on PSG. Needs to be monitor to make sure hypoxemia resolved on O2 at night.   7. HTN - relatively stable   8. Atypical CP that may be related to #1- troponin neg x3.     Signed, Bhagat,Bhavinkumar PA-C   I have personally seen and examined this patient with B. Bhagat PA-C. I agree  with the assessment and plan as outlined above. She continue to diurese well with IV Lasix. She reports improvement in SOB. Weight is down. I/O inaccurate. Lungs overall clear. Still with LE edema. Continue IV lasix today. She wears continuous supplemental O2.   Chontel Warning 01/16/2015 10:40 AM

## 2015-01-16 NOTE — Progress Notes (Signed)
Subjective: Patient feeling better today but was still on ventimask due to desaturation below 90% overnight. Also had some dizziness with standing and movement to bedside chair with PT. Nurse also reports episodic worsened bradycardia to 30s in the arly morning. She reports continued good UOP but some was not measured due to contamination with stooling. Transitioned to nasal cannula by the afternoon.   Objective: Vital signs in last 24 hours: Filed Vitals:   01/16/15 0000 01/16/15 0344 01/16/15 0400 01/16/15 0800  BP: 110/46 110/60 128/51 128/51  Pulse: 43 52 58   Temp: 98.7 F (37.1 C) 98.1 F (36.7 C)    TempSrc: Tympanic Oral    Resp: 14 19 17    Height:      Weight:  138.7 kg (305 lb 12.5 oz)    SpO2: 96% 90% 92%    Weight change: -2.3 kg (-5 lb 1.1 oz)  Intake/Output Summary (Last 24 hours) at 01/16/15 1657 Last data filed at 01/16/15 1100  Gross per 24 hour  Intake    513 ml  Output    400 ml  Net    113 ml   GENERAL- pleasant, morbidly obese, NAD HEENT- Atraumatic, PERRL, EOMI, oral mucosa appears moist CARDIAC- RRR, no murmurs, rubs or gallops. RESP- wearing ventimask, mild basilar inspiratory crackles ABDOMEN- Firm, nontender, venous varicosities, bowel sounds normoactive throughout EXTREMITIES- 1+ pitting pedal edema bilaterally without warmth or discoloration SKIN- Warm, dry, No rash or lesion. PSYCH- Normal mood and affect, appropriate thought content and speech.  Lab Results: Basic Metabolic Panel:  Recent Labs Lab 01/10/15 1744  01/16/15 0807 01/16/15 0933  NA 139  < > 145 144  K 5.8*  < > 4.4 4.5  CL 107  < > 101 103  CO2 26  < > 40* 35*  GLUCOSE 89  < > 78 163*  BUN 45*  < > 45* 45*  CREATININE 1.90*  < > 1.39* 1.34*  CALCIUM 9.3  < > 9.1 8.9  MG 2.0  --   --   --   PHOS 3.9  --   --   --   < > = values in this interval not displayed. Liver Function Tests:  Recent Labs Lab 01/10/15 1744  AST 17  ALT 9*  ALKPHOS 112  BILITOT 0.7  PROT  6.2*  ALBUMIN 3.1*   No results for input(s): LIPASE, AMYLASE in the last 168 hours. No results for input(s): AMMONIA in the last 168 hours. CBC:  Recent Labs Lab 01/10/15 1744 01/11/15 0910 01/15/15 0456  WBC 7.3 5.8 6.4  NEUTROABS 5.0  --   --   HGB 11.0* 10.5* 10.1*  HCT 36.1 36.3 35.2*  MCV 95.3 96.5 97.8  PLT 149* 156 141*   Cardiac Enzymes:  Recent Labs Lab 01/11/15 0306 01/11/15 0910 01/11/15 1515  TROPONINI <0.03 <0.03 <0.03   BNP: No results for input(s): PROBNP in the last 168 hours. D-Dimer:  Recent Labs Lab 01/10/15 2136  DDIMER 2.40*   CBG:  Recent Labs Lab 01/15/15 1143 01/15/15 1641 01/15/15 2112 01/16/15 0741 01/16/15 1134 01/16/15 1644  GLUCAP 150* 94 129* 71 118* 143*   Hemoglobin A1C:  Recent Labs Lab 01/11/15 1548  HGBA1C 6.9*   Fasting Lipid Panel: No results for input(s): CHOL, HDL, LDLCALC, TRIG, CHOLHDL, LDLDIRECT in the last 168 hours. Thyroid Function Tests:  Recent Labs Lab 01/11/15 0306  TSH 4.466   Coagulation: No results for input(s): LABPROT, INR in the last 168 hours. Anemia  Panel: No results for input(s): VITAMINB12, FOLATE, FERRITIN, TIBC, IRON, RETICCTPCT in the last 168 hours. Urine Drug Screen: Drugs of Abuse  No results found for: LABOPIA, COCAINSCRNUR, LABBENZ, AMPHETMU, THCU, LABBARB  Alcohol Level: No results for input(s): ETH in the last 168 hours. Urinalysis: No results for input(s): COLORURINE, LABSPEC, PHURINE, GLUCOSEU, HGBUR, BILIRUBINUR, KETONESUR, PROTEINUR, UROBILINOGEN, NITRITE, LEUKOCYTESUR in the last 168 hours.  Invalid input(s): APPERANCEUR   Micro Results: Recent Results (from the past 240 hour(s))  MRSA PCR Screening     Status: None   Collection Time: 01/11/15  1:13 AM  Result Value Ref Range Status   MRSA by PCR NEGATIVE NEGATIVE Final    Comment:        The GeneXpert MRSA Assay (FDA approved for NASAL specimens only), is one component of a comprehensive MRSA  colonization surveillance program. It is not intended to diagnose MRSA infection nor to guide or monitor treatment for MRSA infections.    Studies/Results: No results found. Medications: I have reviewed the patient's current medications. Scheduled Meds: . albuterol  2.5 mg Nebulization Once  . aspirin EC  81 mg Oral Daily  . furosemide  60 mg Intravenous BID  . gabapentin  300 mg Oral TID  . heparin  5,000 Units Subcutaneous 3 times per day  . insulin aspart  0-15 Units Subcutaneous TID WC  . insulin aspart  0-5 Units Subcutaneous QHS  . insulin detemir  60 Units Subcutaneous QHS  . pantoprazole  40 mg Oral Daily  . sodium chloride  3 mL Intravenous Q12H   Continuous Infusions:  PRN Meds:.acetaminophen **OR** acetaminophen, dextromethorphan-guaiFENesin **AND** sodium chloride, dicyclomine, technetium TC 2M diethylenetriame-pentaacetic acid Assessment/Plan: Resp distress 2/2 to diastolic CHF exacerbation UOP not well recorded, however weight is decreased today. Patient requiring higher oxygen support at night is more likely coming from hypoventilation with OHS, with a metabolic alkalosis of CO2 34-40 today. PSH has not demonstrated OSA previously. She may benefit from positive pressure support, will try CPAP tonight, may advance to qHS BIPAP. - continue lasix 60mg  IV BID today - Request RN to carefully record Urine output and check weight consistently - PT to eval/treat, initial goal transfer to bedside chair for periods - Wean to O2 by [place] if she can tolerate - CPAP at night  Heart block Some concern this is symptomatic with her dizziness on exertion and episodes of marked bradycardia observed by nurse. However this rhythm is chronic and consistent with previous studies.  AKI Improved today. Tolerating 60mg  IV BID lasix extremely well. -qAM Bmet  Hyperkalemia K currently stable with high dose lasix. No replacement or treatment indicated at this time. - continue to monitor  Bmet, replace K as needed  DM II with neuropathy A1c 6.9 at admission. Glucose in controlled range. - continue lantus 60 + SSI-M - gabapentin 300mg  TID  GI ppx: pantoprazole 40mg   Diet: Carb modified DVT ppx: [place] Heparin FULL CODE  Dispo: Disposition is deferred at this time, awaiting improvement of current medical problems. If clinical improvement continued patient may be appropriate for discharge in approximately 2 days.   LOS: 6 days   Collier Salina, MD 01/16/2015, 4:57 PM

## 2015-01-17 LAB — BASIC METABOLIC PANEL
ANION GAP: 5 (ref 5–15)
BUN: 46 mg/dL — AB (ref 6–20)
CO2: 38 mmol/L — ABNORMAL HIGH (ref 22–32)
Calcium: 9 mg/dL (ref 8.9–10.3)
Chloride: 101 mmol/L (ref 101–111)
Creatinine, Ser: 1.3 mg/dL — ABNORMAL HIGH (ref 0.44–1.00)
GFR, EST AFRICAN AMERICAN: 50 mL/min — AB (ref >60)
GFR, EST NON AFRICAN AMERICAN: 43 mL/min — AB (ref >60)
Glucose, Bld: 140 mg/dL — ABNORMAL HIGH (ref 65–99)
POTASSIUM: 4.5 mmol/L (ref 3.5–5.1)
SODIUM: 144 mmol/L (ref 135–145)

## 2015-01-17 LAB — GLUCOSE, CAPILLARY
GLUCOSE-CAPILLARY: 110 mg/dL — AB (ref 65–99)
GLUCOSE-CAPILLARY: 169 mg/dL — AB (ref 65–99)
Glucose-Capillary: 136 mg/dL — ABNORMAL HIGH (ref 65–99)
Glucose-Capillary: 157 mg/dL — ABNORMAL HIGH (ref 65–99)

## 2015-01-17 MED ORDER — FUROSEMIDE 10 MG/ML IJ SOLN
20.0000 mg | Freq: Once | INTRAMUSCULAR | Status: AC
  Administered 2015-01-17: 20 mg via INTRAVENOUS
  Filled 2015-01-17: qty 2

## 2015-01-17 MED ORDER — SENNOSIDES-DOCUSATE SODIUM 8.6-50 MG PO TABS: 1.0000 | ORAL_TABLET | Freq: Every evening | ORAL | Status: DC | PRN

## 2015-01-17 NOTE — Patient Outreach (Signed)
[facility]) Care Management  01/17/2015  RAYVON ESPELAND 11-Jul-1950 [id number]   Referral from Natividad Brood, RN, assigned Kathie Rhodes, RN.  Thanks, Ronnell Freshwater. [place], [place] Assistant Phone: [phone] Fax: [phone]

## 2015-01-17 NOTE — Progress Notes (Signed)
Patient has refused CPAP for the night. SpO2 is 96% and patient does not appear in distress. RT will continue to monitor.

## 2015-01-17 NOTE — Progress Notes (Signed)
Date: 01/17/2015  Patient name: Kathleen [name]  Medical record number: [id number]  Date of birth: [birth year]   ID: 64yo F with OHS presents with DOE, acute diastolic heart failure  S: did not tolerate cpap last night, switched to ventimask and currently on 2L [place] for hypoxia. She has some abdominal discomfort after having BM today. She states SOB is better. Has had some dizziness, sOB with ambulation  O: i/o's poorly recorded  . albuterol  2.5 mg Nebulization Once  . aspirin EC  81 mg Oral Daily  . furosemide  60 mg Intravenous BID  . gabapentin  300 mg Oral TID  . heparin  5,000 Units Subcutaneous 3 times per day  . insulin aspart  0-15 Units Subcutaneous TID WC  . insulin aspart  0-5 Units Subcutaneous QHS  . insulin detemir  60 Units Subcutaneous QHS  . pantoprazole  40 mg Oral Daily  . sodium chloride  3 mL Intravenous Q12H   Physical exam: BP 113/57 mmHg  Pulse 50  Temp(Src) 98 F (36.7 C) (Oral)  Resp 20  Ht 5\' 1"  (1.549 m)  Wt 304 lb 9.6 oz (138.166 kg)  BMI 57.58 kg/m2  SpO2 95% GENERAL- pleasant, morbidly obese, NAD wearing [place] HEENT- Atraumatic, PERRL, EOMI, oral mucosa appears moist CARDIAC- RRR, no murmurs, rubs or gallops. RESP- NRB mask in place, distant breath sounds, bibasilar crackles ABDOMEN- pitting edema to abd wall, protuberant abdomen, bowel sounds normoactive throughout EXTREMITIES- trace pitting pedal edema bilaterally SKIN- Warm, dry, No rash or lesion. PSYCH- Normal mood and affect, appropriate thought content and speech.  Labs: CBC    Component Value Date/Time   WBC 6.4 01/15/2015 0456   RBC 3.60* 01/15/2015 0456   HGB 10.1* 01/15/2015 0456   HCT 35.2* 01/15/2015 0456   PLT 141* 01/15/2015 0456   MCV 97.8 01/15/2015 0456   MCH 28.1 01/15/2015 0456   MCHC 28.7* 01/15/2015 0456   RDW 16.2* 01/15/2015 0456   LYMPHSABS 1.6 01/10/2015 1744   MONOABS 0.6 01/10/2015 1744   EOSABS 0.2 01/10/2015 1744   BASOSABS 0.0 01/10/2015 1744    BMET    Component Value Date/Time   NA 144 01/17/2015 0440   K 4.5 01/17/2015 0440   CL 101 01/17/2015 0440   CO2 38* 01/17/2015 0440   GLUCOSE 140* 01/17/2015 0440   BUN 46* 01/17/2015 0440   CREATININE 1.30* 01/17/2015 0440   CREATININE 0.94 06/07/2014 1027   CALCIUM 9.0 01/17/2015 0440   GFRNONAA 43* 01/17/2015 0440   GFRNONAA 65 06/07/2014 1027   GFRAA 50* 01/17/2015 0440   GFRAA 75 06/07/2014 1027    Assessment and Plan:  This patient's plan of care was discussed with the house staff. Please see their note for complete details. I concur with their findings.   Resp distress 2/2 to diastolic CHF exacerbation -Creatinine continues to improved today with current diuresis of goal of 1.5-2L per day - will continue with lasix and increase 80mg  IV BID - appreciate recs from cardiology today, in agreement with continuing to do diuresis  Heart block, second degree type 1, wenkebach --cardiology is following.she remained in stable bradycardia since admission. - per recs, will avoid av nodal blockers - unclear if she is symptomatic from cardiac standpoint vs. General deconditioning when ambulating  Hypoxemia, thought to be due to OHS - will do trial of cpap tonite again to see if any improvement with dyspnea AKI - improving slightly with current diuresis - will continue to follow bmp to  assess if any contraction alkalosis  DM II with neuropathy A1c 6.9 at admission. Glucose ranging 90-172 - continue lantus 60 + SSI-M - gabapentin 300mg  TID  dispo = discussed possible going to SNF once diuretics switched to orals  Carlyle Basques, MD 01/17/2015, 7:51 PM

## 2015-01-17 NOTE — Progress Notes (Signed)
Patient currently on CPAP, sats 90%. Started complaining that " the mask is making me feel sick to my stomach and I can't breath right." Notified Resp Therapist Alysha, was advised to place back on Ray. Dr. Marijean Bravo at bedside and updated on these changes.

## 2015-01-17 NOTE — Progress Notes (Signed)
Patient Name: Kathleen [name] Date of Encounter: 01/17/2015  Active Problems:  Hypertension  Chest pain  Edema  Hyperkalemia  Heart block  Acute on chronic diastolic congestive heart failure  Hypervolemia   Primary Cardiologist: Dr Acie Fredrickson  Patient Profile: 20 female w/ hx HTN, DM2, IBS, HL, OHS w/ home O2, D-CHF, [place] I, atyp CP, admitted 09/15 w/ CHF and bradycardia.  SUBJECTIVE  Unable to tolerate vent mask last night. Breathing better on nasal cannula now.  vNo CP or palpitation. Still unable to measure urine output accurately due to loose stool BM. Complains of diffuse abdominal pain.  CURRENT MEDS . albuterol  2.5 mg Nebulization Once  . aspirin EC  81 mg Oral Daily  . furosemide  60 mg Intravenous BID  . gabapentin  300 mg Oral TID  . heparin  5,000 Units Subcutaneous 3 times per day  . insulin aspart  0-15 Units Subcutaneous TID WC  . insulin aspart  0-5 Units Subcutaneous QHS  . insulin detemir  60 Units Subcutaneous QHS  . pantoprazole  40 mg Oral Daily  . sodium chloride  3 mL Intravenous Q12H    OBJECTIVE  Filed Vitals:   01/17/15 0440 01/17/15 0500 01/17/15 0621 01/17/15 0745  BP:  122/49  107/35  Pulse: 47 46 51 47  Temp:  97.3 F (36.3 C)  97.6 F (36.4 C)  TempSrc:  Oral  Oral  Resp: 17 18 15 14   Height:      Weight:  304 lb 9.6 oz (138.166 kg)    SpO2: 92% 91% 87% 96%    Intake/Output Summary (Last 24 hours) at 01/17/15 1020 Last data filed at 01/17/15 0600  Gross per 24 hour  Intake    420 ml  Output    625 ml  Net   -205 ml   Filed Weights   01/15/15 0435 01/16/15 0344 01/17/15 0500  Weight: 310 lb 13.6 oz (141 kg) 305 lb 12.5 oz (138.7 kg) 304 lb 9.6 oz (138.166 kg)    PHYSICAL EXAM  General: Pleasant, NAD. Nasal cannula in place. Laying comfortable on bed.  Neuro: Alert and oriented X 3. Moves all extremities spontaneously. Psych: Normal affect. HEENT:  Normal  Neck: Supple without bruits or JVD. Lungs:  Resp regular  and unlabored. Diminished breath sound bibasilar. No crackles.  Heart: RRR no s3, s4, or murmurs. Abdomen: Soft, diffuse tender, non-distended, BS + x 4.  Extremities: No clubbing, cyanosis. 1+ BL LE edema. DP/PT/Radials 2+ and equal bilaterally.  Accessory Clinical Findings  CBC  Recent Labs  01/15/15 0456  WBC 6.4  HGB 10.1*  HCT 35.2*  MCV 97.8  PLT [id number]*   Basic Metabolic Panel  Recent Labs  01/16/15 0933 01/17/15 0440  NA 144 144  K 4.5 4.5  CL 103 101  CO2 35* 38*  GLUCOSE 163* 140*  BUN 45* 46*  CREATININE 1.34* 1.30*  CALCIUM 8.9 9.0    TELE  NSR with rate of high 30s to 50s.   Radiology/Studies  Dg Chest 2 View  01/10/2015   CLINICAL DATA:  Right-sided chest pain and dyspnea for 1 week  EXAM: CHEST  2 VIEW  COMPARISON:  05/22/2013  FINDINGS: Moderate cardiomegaly. Central and basilar pulmonary edema. No pneumothorax.  IMPRESSION: CHF with central and basilar edema.   Electronically Signed   By: Marybelle Killings M.D.   On: 01/10/2015 18:10   Nm Pulmonary Perf And Vent  01/11/2015   CLINICAL DATA:  Short of  breath for four weeks  EXAM: NUCLEAR MEDICINE VENTILATION - PERFUSION LUNG SCAN  TECHNIQUE: Ventilation images were obtained in multiple projections using inhaled aerosol Tc-64m DTPA. Perfusion images were obtained in multiple projections after intravenous injection of Tc-87m MAA.  RADIOPHARMACEUTICALS:  42.29mci Technetium-72m DTPA aerosol inhalation and 6.63mci Technetium-46m MAA IV  COMPARISON:  01/10/15 chest radiograph.  FINDINGS: Ventilation: Heterogenous uptake bilaterally, with less acitivity in the left lung as compared to the right.  Perfusion: Small subsegmental defect in the posterior lateral right upper lobe. Much larger ventilation defect, with no abnormalities on chest radiograph in this area.  IMPRESSION: Very low probability for PE   Electronically Signed   By: Skipper Cliche M.D.   On: 01/11/2015 15:45   Dg Chest Port 1 View  01/14/2015   CLINICAL  DATA:  Shortness of breath  EXAM: PORTABLE CHEST - 1 VIEW  COMPARISON:  01/10/2015  FINDINGS: Cardiac enlargement with pulmonary vascular congestion. Perihilar infiltration consistent with edema. Left costophrenic angle is not well visualized possibly due to overlying soft tissue attenuation but small pleural effusion not excluded. No pneumothorax. No focal consolidation.  IMPRESSION: Cardiac enlargement with pulmonary vascular congestion and perihilar edema.   Electronically Signed   By: Lucienne Capers M.D.   On: 01/14/2015 04:14    ASSESSMENT AND PLAN   1. Acute on chronic diastolic CHF - Most likely multifactorial from dietary indiscretion, chronic nocturnal hypoxemia, weight.  -Breathing improved. On nasal cannula now. Diuresed ~4L so far. Weight down 3lb. Unable to measure all urine output due to contaminated with BM .  - Continue IV Lasix 60mg  BID. Her creatinine improved further to 1.30. - BP relatively stable. Her pulse is running low in high 30s-low 50s.    2. Second-degree Heart block type 1 - PR lengthening at times with dropped beats that appears to be Wenkebach.  - She has a history of this on last admission with a very similar EKG. Avoid AV nodal blocking agents.   3. Low voltage QRS - This was present on last admission and most likely due to morbid obesity and not pericardial effusion.2D ECHO with normal LV function, w/ no effusion, u/a to assess diastolic dysfunction.  4. Acute renal failure - ? Secondary to acute CHF vs. Diabetic nephropathy - will follow closely with diuresis. Creat improving with diuresis. ARB on hold. Resume once stable   5. DM on metformin - Treatment per IM   6. Morbid obesity with obesity hypoventilation syndrome and nighttime hypoxemia on home O2  - Now on nasal cannula.   7. HTN - relatively stable   8. Atypical CP that may be related to #1- troponin neg x3.   9. Abdominal pain with frequent semi loose stool: per  primary  Signed, Bhagat,Bhavinkumar PA-C  I have personally seen and examined this patient with B Bhagat, PA-C. I agree with the assessment and plan as outlined above. She continues to diurese with IV Lasix. I/O not accurate. Weight decreasing. Renal function is stable. She is still volume overloaded but notes improvement in SOB. Continue IV Lasix.   MCALHANY,CHRISTOPHER 01/17/2015 11:10 AM

## 2015-01-17 NOTE — Consult Note (Signed)
   [facility] Inpatient Consult   01/17/2015  Kathleen [name] 09-12-1950 [id number] Referral received for community care management needs for post hospital follow up. 64 year old patient was admitted for a history of HTN, morbid obesity, type 2 DM, IBS, dyslipidemia, Obesity hypoventilation syndrome (on home O2 with no OSA by PSG but showed nocturnal hypoxemia), chronic abdominal pain , chronic diastolic CHF, Wenkebach AV block and history of atypical stabbing CP who presented to the ER with complaints of a 3 week history of increasing SOB and LE edema as well as increased abdominal girth.   Patient endorses her primary care Alvie Speltz as Dr. Dareen Piano.  Patient states she had some equipment failure with her home oxygen and didn't know she was only getting air and not oxygen.  She has had the equipment checked she states but not sure of the company for her equipment. Patient evaluated for community based chronic disease management services with [facility] as a benefit of patient's [employer]. Explained [facility] Care Management services for post hospital follow up due to long length of hospital stay and barriers of transportation needs and minimal family support, lives alone.  Patient will receive post discharge transition of care call and will be evaluated for monthly home visits for assessments and disease process education. Consent form signed.   Left contact information and [facility] literature at bedside. Made Inpatient Case Manager aware that [facility] Management following. Of note, [facility] Care Management services does not replace or interfere with any services that are arranged by inpatient case management or social work.  For additional questions or referrals please contact:   Natividad Brood, RN BSN [facility] Liaison  [phone] business mobile phone

## 2015-01-17 NOTE — Progress Notes (Addendum)
Venti Mask on and patient sleeping, Sats 86-90%. Notified Dr. Marijean Bravo.   0315- Patient refuses to wear CPAP, Dr. Marijean Bravo aware and would like to keep stats above 85%.

## 2015-01-17 NOTE — Progress Notes (Signed)
Subjective: Patient had poor sleep at night, did not tolerate CPAP due to feeling mask was hard to breathe and to disconnected during tossing and turning. Also passed loose bowel movement and continues to have some left sided abdominal pain associated with these irregular bowel movements. Discussed with her physical therapy recommendations for skilled nursing facility placement and rehabilitation. She was insistent on goal of returning home. Discussed with her that we will continue to follow her progress with nursing and physical therapy aided ambulation and transfers while her heart failure exacerbation is resolving.  Objective: Vital signs in last 24 hours: Filed Vitals:   01/17/15 0621 01/17/15 0745 01/17/15 1100 01/17/15 1600  BP:  107/35 118/51 113/57  Pulse: 51 47 54 49  Temp:  97.6 F (36.4 C) 97.5 F (36.4 C) 98 F (36.7 C)  TempSrc:  Oral Oral Oral  Resp: 15 14 22 20   Height:      Weight:      SpO2: 87% 96% 98% 98%   Weight change: -0.534 kg (-1 lb 2.9 oz)  Intake/Output Summary (Last 24 hours) at 01/17/15 1626 Last data filed at 01/17/15 1451  Gross per 24 hour  Intake    780 ml  Output   1325 ml  Net   -545 ml   GENERAL- pleasant, morbidly obese, NAD HEENT- Atraumatic, PERRL, EOMI, oral mucosa appears moist CARDIAC- RRR, no murmurs, rubs or gallops. RESP- wearing nasal cannula, mild basilar inspiratory crackles ABDOMEN- Firm, nontender, venous varicosities, bowel sounds normoactive throughout EXTREMITIES- 1+ pitting pedal edema bilaterally without warmth or discoloration SKIN- Warm, dry, No rash or lesion. PSYCH- Normal mood and affect, appropriate thought content and speech.  Lab Results: Basic Metabolic Panel:  Recent Labs Lab 01/10/15 1744  01/16/15 0933 01/17/15 0440  NA 139  < > 144 144  K 5.8*  < > 4.5 4.5  CL 107  < > 103 101  CO2 26  < > 35* 38*  GLUCOSE 89  < > 163* 140*  BUN 45*  < > 45* 46*  CREATININE 1.90*  < > 1.34* 1.30*  CALCIUM 9.3   < > 8.9 9.0  MG 2.0  --   --   --   PHOS 3.9  --   --   --   < > = values in this interval not displayed. Liver Function Tests:  Recent Labs Lab 01/10/15 1744  AST 17  ALT 9*  ALKPHOS 112  BILITOT 0.7  PROT 6.2*  ALBUMIN 3.1*   No results for input(s): LIPASE, AMYLASE in the last 168 hours. No results for input(s): AMMONIA in the last 168 hours. CBC:  Recent Labs Lab 01/10/15 1744 01/11/15 0910 01/15/15 0456  WBC 7.3 5.8 6.4  NEUTROABS 5.0  --   --   HGB 11.0* 10.5* 10.1*  HCT 36.1 36.3 35.2*  MCV 95.3 96.5 97.8  PLT 149* 156 141*   Cardiac Enzymes:  Recent Labs Lab 01/11/15 0306 01/11/15 0910 01/11/15 1515  TROPONINI <0.03 <0.03 <0.03   BNP: No results for input(s): PROBNP in the last 168 hours. D-Dimer:  Recent Labs Lab 01/10/15 2136  DDIMER 2.40*   CBG:  Recent Labs Lab 01/16/15 0741 01/16/15 1134 01/16/15 1644 01/16/15 2145 01/17/15 0741 01/17/15 1132  GLUCAP 71 118* 143* 180* 110* 169*   Hemoglobin A1C:  Recent Labs Lab 01/11/15 1548  HGBA1C 6.9*   Fasting Lipid Panel: No results for input(s): CHOL, HDL, LDLCALC, TRIG, CHOLHDL, LDLDIRECT in the last 168 hours.  Thyroid Function Tests:  Recent Labs Lab 01/11/15 0306  TSH 4.466   Coagulation: No results for input(s): LABPROT, INR in the last 168 hours. Anemia Panel: No results for input(s): VITAMINB12, FOLATE, FERRITIN, TIBC, IRON, RETICCTPCT in the last 168 hours. Urine Drug Screen: Drugs of Abuse  No results found for: LABOPIA, COCAINSCRNUR, LABBENZ, AMPHETMU, THCU, LABBARB  Alcohol Level: No results for input(s): ETH in the last 168 hours. Urinalysis: No results for input(s): COLORURINE, LABSPEC, PHURINE, GLUCOSEU, HGBUR, BILIRUBINUR, KETONESUR, PROTEINUR, UROBILINOGEN, NITRITE, LEUKOCYTESUR in the last 168 hours.  Invalid input(s): APPERANCEUR   Micro Results: Recent Results (from the past 240 hour(s))  MRSA PCR Screening     Status: None   Collection Time: 01/11/15   1:13 AM  Result Value Ref Range Status   MRSA by PCR NEGATIVE NEGATIVE Final    Comment:        The GeneXpert MRSA Assay (FDA approved for NASAL specimens only), is one component of a comprehensive MRSA colonization surveillance program. It is not intended to diagnose MRSA infection nor to guide or monitor treatment for MRSA infections.    Studies/Results: No results found. Medications: I have reviewed the patient's current medications. Scheduled Meds: . albuterol  2.5 mg Nebulization Once  . aspirin EC  81 mg Oral Daily  . furosemide  60 mg Intravenous BID  . gabapentin  300 mg Oral TID  . heparin  5,000 Units Subcutaneous 3 times per day  . insulin aspart  0-15 Units Subcutaneous TID WC  . insulin aspart  0-5 Units Subcutaneous QHS  . insulin detemir  60 Units Subcutaneous QHS  . pantoprazole  40 mg Oral Daily  . sodium chloride  3 mL Intravenous Q12H   Continuous Infusions:  PRN Meds:.acetaminophen **OR** acetaminophen, dextromethorphan-guaiFENesin **AND** sodium chloride, dicyclomine, senna-docusate, technetium TC 50M diethylenetriame-pentaacetic acid Assessment/Plan: Resp distress 2/2 to diastolic CHF exacerbation UOP not well recorded, however weight is decreased today. Patient requiring higher oxygen support at night is more likely coming from hypoventilation with OHS, with a metabolic alkalosis of CO2 34-40 today. Did not tolerate CPAP last night. Discussed alternative mask type of respiratory therapy and a try BiPAP tonight. Patient also needs to improve ambulation status. - Lasix 60mg  IV BID today, consider additional 20mg  IV dose increase - Request RN to carefully record Urine output and check weight consistently - PT to eval/treat, initial goal transfer to bedside chair for periods - Wean to O2 by  if she can tolerate - CPAP at night  Heart block Some concern this is symptomatic with her dizziness on exertion and episodes of marked bradycardia observed by  nurse. However this rhythm is chronic and consistent with previous studies.  AKI Improved today. Tolerating 60mg  IV BID lasix extremely well. -qAM Bmet  Hyperkalemia K currently stable with high dose lasix. No replacement or treatment indicated at this time. - continue to monitor Bmet, replace K as needed  DM II with neuropathy A1c 6.9 at admission. Glucose in controlled range. - continue lantus 60 + SSI-M - gabapentin 300mg  TID  GI ppx: pantoprazole 40mg   Diet: Carb modified DVT ppx: [place] Heparin FULL CODE  Dispo: Disposition is deferred at this time, awaiting improvement of current medical problems. If clinical improvement continued patient may be appropriate for discharge in approximately 1-2 days.   LOS: 7 days   Collier Salina, MD 01/17/2015, 4:26 PM

## 2015-01-17 NOTE — Care Management Important Message (Signed)
Important Message  Patient Details  Name: Kathleen [name] MRN: [id number] Date of Birth: [birth year]   Medicare Important Message Given:  Yes-third notification given    Delorse Lek 01/17/2015, 9:16 AM

## 2015-01-18 ENCOUNTER — Other Ambulatory Visit: Payer: Self-pay | Admitting: Internal Medicine

## 2015-01-18 DIAGNOSIS — I5022 Chronic systolic (congestive) heart failure: Secondary | ICD-10-CM

## 2015-01-18 LAB — GLUCOSE, CAPILLARY
GLUCOSE-CAPILLARY: 169 mg/dL — AB (ref 65–99)
Glucose-Capillary: 120 mg/dL — ABNORMAL HIGH (ref 65–99)

## 2015-01-18 LAB — BASIC METABOLIC PANEL
ANION GAP: 4 — AB (ref 5–15)
BUN: 39 mg/dL — ABNORMAL HIGH (ref 6–20)
CALCIUM: 8.9 mg/dL (ref 8.9–10.3)
CHLORIDE: 97 mmol/L — AB (ref 101–111)
CO2: 40 mmol/L — AB (ref 22–32)
Creatinine, Ser: 1.27 mg/dL — ABNORMAL HIGH (ref 0.44–1.00)
GFR calc non Af Amer: 44 mL/min — ABNORMAL LOW (ref >60)
GFR, EST AFRICAN AMERICAN: 51 mL/min — AB (ref >60)
Glucose, Bld: 129 mg/dL — ABNORMAL HIGH (ref 65–99)
Potassium: 4 mmol/L (ref 3.5–5.1)
Sodium: 141 mmol/L (ref 135–145)

## 2015-01-18 MED ORDER — BUMETANIDE 1 MG PO TABS: 3.0000 mg | ORAL_TABLET | Freq: Two times a day (BID) | ORAL | Status: DC

## 2015-01-18 MED ORDER — BUMETANIDE 1 MG PO TABS
3.0000 mg | ORAL_TABLET | Freq: Two times a day (BID) | ORAL | Status: DC
  Filled 2015-01-18 (×2): qty 1

## 2015-01-18 MED ORDER — FUROSEMIDE 10 MG/ML IJ SOLN: 60.0000 mg | Freq: Three times a day (TID) | INTRAMUSCULAR | Status: DC

## 2015-01-18 NOTE — Care Management Important Message (Signed)
Important Message  Patient Details  Name: Kathleen [name] MRN: [id number] Date of Birth: [birth year]   Medicare Important Message Given:  Yes-fourth notification given    Loann Quill 01/18/2015, 10:46 AM

## 2015-01-18 NOTE — Progress Notes (Signed)
Subjective: Refused CPAP overnight, but slept well on only oxygen. Transferred herself to bedside chair this morning, and was resting comfortably there. She does feel her feet are less stable than her usual but was able to walk with walker. Continued good urine output. Had a bowel movement with some relief of left abdominal discomfort.  Objective: Vital signs in last 24 hours: Filed Vitals:   01/18/15 0007 01/18/15 0426 01/18/15 0710 01/18/15 0711  BP: 108/49 137/50 100/42   Pulse: 58 59 46   Temp: 98.1 F (36.7 C) 98.1 F (36.7 C)  98.2 F (36.8 C)  TempSrc: Oral Oral  Oral  Resp: 20 22 17    Height:      Weight:  135.988 kg (299 lb 12.8 oz)    SpO2: 94% 92% 90%    Weight change: -2.177 kg (-4 lb 12.8 oz)  Intake/Output Summary (Last 24 hours) at 01/18/15 0806 Last data filed at 01/18/15 0426  Gross per 24 hour  Intake    960 ml  Output   2100 ml  Net  -1140 ml   GENERAL- pleasant, morbidly obese, NAD HEENT- Atraumatic, PERRL, EOMI, oral mucosa appears moist CARDIAC- RRR, no murmurs, rubs or gallops. RESP- wearing nasal cannula, mild basilar inspiratory crackles ABDOMEN- Firm, nontender, venous varicosities, bowel sounds normoactive throughout EXTREMITIES- 1+ pitting pedal edema bilaterally without warmth or discoloration SKIN- Warm, dry, No rash or lesion. PSYCH- Normal mood and affect, appropriate thought content and speech.  Lab Results: Basic Metabolic Panel:  Recent Labs Lab 01/17/15 0440 01/18/15 0526  NA 144 141  K 4.5 4.0  CL 101 97*  CO2 38* 40*  GLUCOSE 140* 129*  BUN 46* 39*  CREATININE 1.30* 1.27*  CALCIUM 9.0 8.9   Liver Function Tests: No results for input(s): AST, ALT, ALKPHOS, BILITOT, PROT, ALBUMIN in the last 168 hours. No results for input(s): LIPASE, AMYLASE in the last 168 hours. No results for input(s): AMMONIA in the last 168 hours. CBC:  Recent Labs Lab 01/11/15 0910 01/15/15 0456  WBC 5.8 6.4  HGB 10.5* 10.1*  HCT 36.3  35.2*  MCV 96.5 97.8  PLT 156 141*   Cardiac Enzymes:  Recent Labs Lab 01/11/15 0910 01/11/15 1515  TROPONINI <0.03 <0.03   BNP: No results for input(s): PROBNP in the last 168 hours. D-Dimer: No results for input(s): DDIMER in the last 168 hours. CBG:  Recent Labs Lab 01/16/15 2145 01/17/15 0741 01/17/15 1132 01/17/15 1636 01/17/15 2114 01/18/15 0711  GLUCAP 180* 110* 169* 136* 157* 120*   Hemoglobin A1C:  Recent Labs Lab 01/11/15 1548  HGBA1C 6.9*   Fasting Lipid Panel: No results for input(s): CHOL, HDL, LDLCALC, TRIG, CHOLHDL, LDLDIRECT in the last 168 hours. Thyroid Function Tests: No results for input(s): TSH, T4TOTAL, FREET4, T3FREE, THYROIDAB in the last 168 hours. Coagulation: No results for input(s): LABPROT, INR in the last 168 hours. Anemia Panel: No results for input(s): VITAMINB12, FOLATE, FERRITIN, TIBC, IRON, RETICCTPCT in the last 168 hours. Urine Drug Screen: Drugs of Abuse  No results found for: LABOPIA, COCAINSCRNUR, LABBENZ, AMPHETMU, THCU, LABBARB  Alcohol Level: No results for input(s): ETH in the last 168 hours. Urinalysis: No results for input(s): COLORURINE, LABSPEC, PHURINE, GLUCOSEU, HGBUR, BILIRUBINUR, KETONESUR, PROTEINUR, UROBILINOGEN, NITRITE, LEUKOCYTESUR in the last 168 hours.  Invalid input(s): APPERANCEUR   Micro Results: Recent Results (from the past 240 hour(s))  MRSA PCR Screening     Status: None   Collection Time: 01/11/15  1:13 AM  Result Value  Ref Range Status   MRSA by PCR NEGATIVE NEGATIVE Final    Comment:        The GeneXpert MRSA Assay (FDA approved for NASAL specimens only), is one component of a comprehensive MRSA colonization surveillance program. It is not intended to diagnose MRSA infection nor to guide or monitor treatment for MRSA infections.    Studies/Results: No results found. Medications: I have reviewed the patient's current medications. Scheduled Meds: . albuterol  2.5 mg  Nebulization Once  . aspirin EC  81 mg Oral Daily  . furosemide  60 mg Intravenous BID  . gabapentin  300 mg Oral TID  . heparin  5,000 Units Subcutaneous 3 times per day  . insulin aspart  0-15 Units Subcutaneous TID WC  . insulin aspart  0-5 Units Subcutaneous QHS  . insulin detemir  60 Units Subcutaneous QHS  . pantoprazole  40 mg Oral Daily  . sodium chloride  3 mL Intravenous Q12H   Continuous Infusions:  PRN Meds:.acetaminophen **OR** acetaminophen, dextromethorphan-guaiFENesin **AND** sodium chloride, dicyclomine, senna-docusate, technetium TC 21M diethylenetriame-pentaacetic acid Assessment/Plan: Resp distress 2/2 to diastolic CHF exacerbation CO2 40 today. Did not tolerate CPAP last night, but did not desturate badly on nasal cannula. Patient is nearing baseline status today and can probably follow up outpatient. - Transition to 120mg  PO lasix today as discharge dose - Request RN to carefully record Urine output and check weight consistently - PT to eval/treat, initial goal transfer to bedside chair for periods - Wean to O2 by [place] if she can tolerate  Heart block Some concern this is symptomatic with her dizziness on exertion and episodes of marked bradycardia observed by nurse. However this rhythm is chronic and consistent with previous studies.  AKI Improved today. Tolerating 60mg  IV BID lasix extremely well. -qAM Bmet  Hyperkalemia K currently stable with high dose lasix. No replacement or treatment indicated at this time. - continue to monitor Bmet, replace K as needed  DM II with neuropathy A1c 6.9 at admission. Glucose in controlled range. - continue lantus 60 + SSI-M - gabapentin 300mg  TID  GI ppx: pantoprazole 40mg   Diet: Carb modified DVT ppx: [place] Heparin FULL CODE  Dispo: Disposition is deferred at this time, awaiting improvement of current medical problems. If clinical improvement continued patient may be appropriate for discharge in approximately 1-2  days.    LOS: 8 days   Collier Salina, MD 01/18/2015, 8:06 AM

## 2015-01-18 NOTE — Progress Notes (Signed)
Patient asked for rx of bumex to be sent to her [facility] instead of mail in pharmacy.

## 2015-01-18 NOTE — Progress Notes (Signed)
Patient Name: Kathleen [name] Date of Encounter: 01/18/2015  Active Problems:  Hypertension  Chest pain  Edema  Hyperkalemia  Heart block  Acute on chronic diastolic congestive heart failure  Hypervolemia   Primary Cardiologist: Dr Acie Fredrickson  Patient Profile: 89 female w/ hx HTN, DM2, IBS, HL, OHS w/ home O2, D-CHF, [place] I, atyp CP, admitted 09/15 w/ CHF and bradycardia.  SUBJECTIVE  Breathing better on nasal cannula now. Transfered herself to bedside chair this morning No CP or palpitation. No further abdominal pain. Per patient her dry weight was 280lb two months ago.   CURRENT MEDS . albuterol  2.5 mg Nebulization Once  . aspirin EC  81 mg Oral Daily  . furosemide  60 mg Intravenous BID  . gabapentin  300 mg Oral TID  . heparin  5,000 Units Subcutaneous 3 times per day  . insulin aspart  0-15 Units Subcutaneous TID WC  . insulin aspart  0-5 Units Subcutaneous QHS  . insulin detemir  60 Units Subcutaneous QHS  . pantoprazole  40 mg Oral Daily  . sodium chloride  3 mL Intravenous Q12H    OBJECTIVE  Filed Vitals:   01/18/15 0426 01/18/15 0710 01/18/15 0711 01/18/15 0838  BP: 137/50 100/42    Pulse: 59 46  63  Temp: 98.1 F (36.7 C)  98.2 F (36.8 C)   TempSrc: Oral  Oral   Resp: 22 17  17   Height:      Weight: 299 lb 12.8 oz (135.988 kg)     SpO2: 92% 90%  91%    Intake/Output Summary (Last 24 hours) at 01/18/15 0853 Last data filed at 01/18/15 0836  Gross per 24 hour  Intake   1440 ml  Output   2100 ml  Net   -660 ml   Filed Weights   01/16/15 0344 01/17/15 0500 01/18/15 0426  Weight: 305 lb 12.5 oz (138.7 kg) 304 lb 9.6 oz (138.166 kg) 299 lb 12.8 oz (135.988 kg)    PHYSICAL EXAM  General: Pleasant, NAD. Nasal cannula in place. Laying comfortable on bed.  Neuro: Alert and oriented X 3. Moves all extremities spontaneously. Psych: Normal affect. HEENT:  Normal  Neck: Supple without bruits or JVD heard to assess due to body habituate.  Lungs:   Resp regular and unlabored. Faint bibasilar crackles.  Heart: RRR no s3, s4, or murmurs. Abdomen: Soft, diffuse tender, non-distended, BS + x 4.  Extremities: No clubbing, cyanosis. 1+ BL LE edema. DP/PT/Radials 2+ and equal bilaterally.  Accessory Clinical Findings  CBC No results for input(s): WBC, NEUTROABS, HGB, HCT, MCV, PLT in the last 72 hours. Basic Metabolic Panel  Recent Labs  01/17/15 0440 01/18/15 0526  NA 144 141  K 4.5 4.0  CL 101 97*  CO2 38* 40*  GLUCOSE 140* 129*  BUN 46* 39*  CREATININE 1.30* 1.27*  CALCIUM 9.0 8.9    TELE  NSR with rate 50s  Radiology/Studies  Dg Chest 2 View  01/10/2015   CLINICAL DATA:  Right-sided chest pain and dyspnea for 1 week  EXAM: CHEST  2 VIEW  COMPARISON:  05/22/2013  FINDINGS: Moderate cardiomegaly. Central and basilar pulmonary edema. No pneumothorax.  IMPRESSION: CHF with central and basilar edema.   Electronically Signed   By: Marybelle Killings M.D.   On: 01/10/2015 18:10   Nm Pulmonary Perf And Vent  01/11/2015   CLINICAL DATA:  Short of breath for four weeks  EXAM: NUCLEAR MEDICINE VENTILATION - PERFUSION LUNG SCAN  TECHNIQUE: Ventilation images were obtained in multiple projections using inhaled aerosol Tc-79m DTPA. Perfusion images were obtained in multiple projections after intravenous injection of Tc-74m MAA.  RADIOPHARMACEUTICALS:  42.23mci Technetium-67m DTPA aerosol inhalation and 6.46mci Technetium-33m MAA IV  COMPARISON:  01/10/15 chest radiograph.  FINDINGS: Ventilation: Heterogenous uptake bilaterally, with less acitivity in the left lung as compared to the right.  Perfusion: Small subsegmental defect in the posterior lateral right upper lobe. Much larger ventilation defect, with no abnormalities on chest radiograph in this area.  IMPRESSION: Very low probability for PE   Electronically Signed   By: Skipper Cliche M.D.   On: 01/11/2015 15:45   Dg Chest Port 1 View  01/14/2015   CLINICAL DATA:  Shortness of breath  EXAM:  PORTABLE CHEST - 1 VIEW  COMPARISON:  01/10/2015  FINDINGS: Cardiac enlargement with pulmonary vascular congestion. Perihilar infiltration consistent with edema. Left costophrenic angle is not well visualized possibly due to overlying soft tissue attenuation but small pleural effusion not excluded. No pneumothorax. No focal consolidation.  IMPRESSION: Cardiac enlargement with pulmonary vascular congestion and perihilar edema.   Electronically Signed   By: Lucienne Capers M.D.   On: 01/14/2015 04:14    ASSESSMENT AND PLAN   1. Acute on chronic diastolic CHF - Most likely multifactorial from dietary indiscretion, chronic nocturnal hypoxemia, weight.  -Breathing improved. On nasal cannula now. Unable to measure urine output accurately. Weight down 8lb (307->299lb). Her dry weight was 280lb 2 months ago - Continue IV Lasix 60mg  BID. Her creatinine improved further to 1.27. - BP relatively stable.   2. Second-degree Heart block type 1 - PR lengthening at times with dropped beats that appears to be Wenkebach.  - She has a history of this on last admission with a very similar EKG. Avoid AV nodal blocking agents.   3. Low voltage QRS - This was present on last admission and most likely due to morbid obesity and not pericardial effusion.2D ECHO with normal LV function, w/ no effusion, u/a to assess diastolic dysfunction.  4. Acute renal failure - ? Secondary to acute CHF vs. Diabetic nephropathy - will follow closely with diuresis. Creat improving with diuresis. ARB on hold. Resume once stable   5. DM on metformin - Treatment per IM   6. Morbid obesity with obesity hypoventilation syndrome and nighttime hypoxemia on home O2  - Now on nasal cannula.   7. HTN - relatively stable   8. Atypical CP that may be related to #1- troponin neg x3.   9. Abdominal pain with frequent semi loose stool: Now improved.   Dispo: She is not at her baseline dry weight. Will need PT eval before discharge. Continue  IV diuresis today. Consider compression stocking vs increasing lasix dose.   Bhagat,Bhavinkumar, PAC 8:53 AM   I have personally seen and examined this patient with B. Bhagat, PA-C. I agree with the assessment and plan as outlined above. She is diuresing but still not at dry weight. Will increase Lasix to 60 mg IV TID. Her renal function is stable. Exam unchanged. Lungs are clear. She has bilateral LE edema. RRR.   MCALHANY,CHRISTOPHER 01/18/2015 10:39 AM

## 2015-01-18 NOTE — Discharge Instructions (Signed)
Please apply warm compresses to your bump on the upper left back.  If this does not get better, it can be drained when you follow up in the clinic.  Start taking BUMEX (water pill) starting today, 3 mg daily, twice a day.  Check your weight daily.

## 2015-01-18 NOTE — Progress Notes (Signed)
Pt states she does not need CPAP at home. She states she believes she sleeps well and comfortable at home with just her oxygen. Will notify MD

## 2015-01-18 NOTE — Progress Notes (Signed)
Pt has place on top of left shoulder. Area is reddened, a little bigger than yesterday. MD notified

## 2015-01-18 NOTE — Progress Notes (Signed)
Pt discharged home. Pt verbalizes all discharge instructions. Pt also instructed to follow up with her PCP next week. New medication BUMEX sent to her pharmacy. Pt IV removed.

## 2015-01-18 NOTE — Discharge Summary (Signed)
Name: [name] MRN: [id number] DOB: 1950/12/18 64 y.o. PCP: Aldine Contes, MD  Date of Admission: 01/10/2015  5:15 PM Date of Discharge: 01/18/2015 Attending Physician: Carlyle Basques  Discharge Diagnosis: Primary Problem   Acute on chronic diastolic congestive heart failure Active Problems:   Hypertension   Chest pain   Edema   Hyperkalemia   Heart block   Hypervolemia  Discharge Medications:   Medication List    STOP taking these medications        furosemide 40 MG tablet  Commonly known as:  LASIX      TAKE these medications        ACCU-CHEK FASTCLIX LANCETS Misc  TEST THREE TIMES DAILY     ACCU-CHEK SMARTVIEW test strip  Generic drug:  glucose blood  TEST BLOOD SUGAR THREE TIMES DAILY     amitriptyline 50 MG tablet  Commonly known as:  ELAVIL  Take 25 mg by mouth at bedtime.     aspirin 81 MG tablet  Take 81 mg by mouth daily.     bumetanide 1 MG tablet  Commonly known as:  BUMEX  Take 3 tablets (3 mg total) by mouth 2 (two) times daily.     dicyclomine 20 MG tablet  Commonly known as:  BENTYL  TAKE 1 TABLET (20 MG TOTAL) BY MOUTH 3 (THREE) TIMES DAILY AS NEEDED FOR SPASMS.     gabapentin 300 MG capsule  Commonly known as:  NEURONTIN  Take 1 capsule (300 mg total) by mouth 3 (three) times daily.     LEVEMIR 100 UNIT/ML injection  Generic drug:  insulin detemir  INJECT 87 UNITS AT BEDTIME     losartan 50 MG tablet  Commonly known as:  COZAAR  Take 1 tablet (50 mg total) by mouth daily.     metFORMIN 1000 MG tablet  Commonly known as:  GLUCOPHAGE  TAKE 1 TABLET (1,000 MG TOTAL) BY MOUTH 2 (TWO) TIMES DAILY WITH A MEAL.     NON FORMULARY  Place 2 L into the nose daily. Wears with exertion and qhs     omeprazole 20 MG capsule  Commonly known as:  PRILOSEC  Take 1 capsule (20 mg total) by mouth daily.        Disposition and follow-up:   Kathleen [name] was discharged from [facility] in Stable condition.  At  the hospital follow up visit please address:  1.  Patient had a chronic left upper back bump, on exam looks like infected pimple. We did not notice this until discharge day because she did not bring this up to [place] before. It's red and tender 2x2 cm in size. We did not have time to do I&D on discharge day. Patient wants to get this taken care of on hospital follow up.  Asked to apply warm compresses until hospital follow up 01/22/15 with Dr. Dareen Piano.   She was in the hospital on 60mg  IV lasix BID with good diuresis. She is doing well from her CHF stand point. At home she was discharged on 3 mg BUMEX po BID = 120 mg Lasixpo  BID = 60mg  IV lasix bid.  Needs BMET to make sure crt is stable on this regimen.  Asked to check daily weights and asked to wear oxygen as needed at home.  May benefit from outpatient sleep study. Last sleep study did not qualify for CPAP but she did feel better on cpap in the hospital al though she did not  like to be on cpap.  2.  Labs / imaging needed at time of follow-up: BMET  3.  Pending labs/ test needing follow-up: none.  Follow-up Appointments: Follow-up Information    Follow up with Echo.   Why:  Regisgtered Nurse for CHF Management and Education officer, museum for transportation needs, Physical Therapy   Contact information:   [address] [place] [address] [phone]       Discharge Instructions: Discharge Instructions    AMB Referral to [place] Management    Complete by:  As directed   Reason for consult:  Post hospital care management due to long hospital stay for HF exacerbation  Diagnoses of:   Heart Failure COPD/ Pneumonia Diabetes    Expected date of contact:  1-3 days (reserved for hospital discharges)  Please assign to community nurse for transition of care calls and assess for home visits. Patient will have home health per inpatient RNCM.   Patient expressed transportation needs for appointments. Questions please  call: Natividad Brood, RN BSN [facility] Liaison  [phone] business mobile phone           Consultations: Treatment Team:  Rounding Lbcardiology, MD  Procedures Performed:  Dg Chest 2 View  01/10/2015   CLINICAL DATA:  Right-sided chest pain and dyspnea for 1 week  EXAM: CHEST  2 VIEW  COMPARISON:  05/22/2013  FINDINGS: Moderate cardiomegaly. Central and basilar pulmonary edema. No pneumothorax.  IMPRESSION: CHF with central and basilar edema.   Electronically Signed   By: Marybelle Killings M.D.   On: 01/10/2015 18:10   Nm Pulmonary Perf And Vent  01/11/2015   CLINICAL DATA:  Short of breath for four weeks  EXAM: NUCLEAR MEDICINE VENTILATION - PERFUSION LUNG SCAN  TECHNIQUE: Ventilation images were obtained in multiple projections using inhaled aerosol Tc-10m DTPA. Perfusion images were obtained in multiple projections after intravenous injection of Tc-57m MAA.  RADIOPHARMACEUTICALS:  42.56mci Technetium-85m DTPA aerosol inhalation and 6.43mci Technetium-39m MAA IV  COMPARISON:  01/10/15 chest radiograph.  FINDINGS: Ventilation: Heterogenous uptake bilaterally, with less acitivity in the left lung as compared to the right.  Perfusion: Small subsegmental defect in the posterior lateral right upper lobe. Much larger ventilation defect, with no abnormalities on chest radiograph in this area.  IMPRESSION: Very low probability for PE   Electronically Signed   By: Skipper Cliche M.D.   On: 01/11/2015 15:45   Dg Chest Port 1 View  01/14/2015   CLINICAL DATA:  Shortness of breath  EXAM: PORTABLE CHEST - 1 VIEW  COMPARISON:  01/10/2015  FINDINGS: Cardiac enlargement with pulmonary vascular congestion. Perihilar infiltration consistent with edema. Left costophrenic angle is not well visualized possibly due to overlying soft tissue attenuation but small pleural effusion not excluded. No pneumothorax. No focal consolidation.  IMPRESSION: Cardiac enlargement with pulmonary vascular congestion  and perihilar edema.   Electronically Signed   By: Lucienne Capers M.D.   On: 01/14/2015 04:14    2D Echo:   Left ventricle: The cavity size was normal. Wall thickness was normal. Systolic function was normal. The estimated ejection fraction was in the range of 55% to 60%. Wall motion was normal; there were no regional wall motion abnormalities. The study is not technically sufficient to allow evaluation of LV diastolic function. - Left atrium: The atrium was mildly dilated. - Right atrium: The atrium was mildly dilated.  Impressions:  - Normal LV function; mild biatrial enlargement   Admission HPI:  Ms. Depriest is a 64 yo woman with morbid obesity, DMII, HTN, HLD, IBS-diarrhea, OHS on 2L [place], and Type I 2nd degree heart block, and h/o dCHF exacerbation, presenting with 2-3 weeks of worsening SOB, weight gain, and LE edema. She reports the inability to lie flat without becoming SOB, which has been chronic for years. She sleeps with 2 pillows. She cannot walk 50 feet or one flight of stairs without becoming SOB (baseline: 3 flights). She reports worsening LE edema and weight gain (she is not sure how much). She reports abdominal fullness to the point that she becomes increasingly SOB with sitting up. She endorses cough for the last week, productive of clear sputum, worse at night. She endorses compliance to her Lasix 40 mg daily. She denies the use of salt, and reports drinking 5-6 bottles of water/day. For the last 2-3 days, she has ben having right sided, squeezing, chest pain that does not radiate. The pain is reproducible with palpation. She has no h/o angina or MI.  She endorses subjective fevers, chills, and muscle/joint pains. She endorses chronic diarrhea 2/2 IBS. She denies N/V or dysuria. She does not smoke or drink alcohol.  She was admitted in Jan 2015 with similar symptoms, thought to be due to [facility] exacerbation. Last echo was 2014, showing EF [id number] and no  diastolic dysfunction. However, given her obesity, her AV block, and her response to Lasix, symptoms were thought to be 2/2 dCHF exacerbation. She did not undergo stress test 2/2 her obesity, and is not a candidate for beta blockers 2/2 her AV block. She has not had a repeat echo since that time.   She underwent sleep study to evaluate for OSA, which not diagnostic, but demonstrated nocturnal hypoxemia. PFTs June 2015 demonstrated restrictive lung disease.   Hospital Course by problem list: Acute on chronic diastolic congestive heart failure Patient admitted with severe hyperkalemia noted on chest x-ray pulmonary vascular congestion and extensive pitting edema on physical exam. BNP on admission was 313.7. Patient initially diuresed at 80 mg IV Lasix twice a day which mildly exacerbated renal function. Diuresis decreased to 60 mg IV Lasix twice a day with a net negative 1-2 liters volume per day. Renal function continued trending towards baseline at this dose. Patient was noted to develop hypoxia at night on nasal cannula and required Ventimask to maintain oxygen saturation greater than 90%. Trial of CPAP therapy but patient did not tolerate mask well. After 6 days of diuresis patient no longer required increased oxygen support at night and was ambulatory without assistance. Transitioned from IV Lasix to Bumex at discharge.  Type I second degree heart block Patient was a symptomatically bradycardic throughout admission, heart rate 40s to 50s. All AV nodal blocking agents avoided. Was followed by cardiology without recommendation for further intervention at this time.  Hyperkalemia Admitted with potassium of 5.8 in the setting of AKI and possibly a relative loop diuretic deficiency. This rapidly corrected with increasing doses of intravenous furosemide and trended downward during admission without supplementation. Patient discharged on no supplemental potassium, to be followed up at PCP appointment,  but may have some correction with restarting losartan 50 mg.  Acute kidney injury Admitted with creatinine of 1.9 up from 0.94 latest previous baseline. Suspect etiology was cardiorenal in setting of heart failure exacerbation. Kidney function actually improved with diuresis to 1.27 at time of discharge on 9/23. Urine output response to diuretics was very robust.  Discharge Vitals:   BP 100/42 mmHg  Pulse 63  Temp(Src) 98.3 F (36.8 C) (Oral)  Resp 17  Ht 5\' 1"  (1.549 m)  Wt 135.988 kg (299 lb 12.8 oz)  BMI 56.68 kg/m2  SpO2 91%  Discharge Labs:  No results found for this or any previous visit (from the past 24 hour(s)).  Signed: Collier Salina, MD 01/20/2015, 6:14 PM   Services Ordered on Discharge: [facility]

## 2015-01-20 DIAGNOSIS — E662 Morbid (severe) obesity with alveolar hypoventilation: Secondary | ICD-10-CM | POA: Diagnosis not present

## 2015-01-20 DIAGNOSIS — E1142 Type 2 diabetes mellitus with diabetic polyneuropathy: Secondary | ICD-10-CM | POA: Diagnosis not present

## 2015-01-20 DIAGNOSIS — K580 Irritable bowel syndrome with diarrhea: Secondary | ICD-10-CM | POA: Diagnosis not present

## 2015-01-20 DIAGNOSIS — I441 Atrioventricular block, second degree: Secondary | ICD-10-CM | POA: Diagnosis not present

## 2015-01-20 DIAGNOSIS — R222 Localized swelling, mass and lump, trunk: Secondary | ICD-10-CM | POA: Diagnosis not present

## 2015-01-20 DIAGNOSIS — I5033 Acute on chronic diastolic (congestive) heart failure: Secondary | ICD-10-CM | POA: Diagnosis not present

## 2015-01-20 DIAGNOSIS — J984 Other disorders of lung: Secondary | ICD-10-CM | POA: Diagnosis not present

## 2015-01-20 DIAGNOSIS — I10 Essential (primary) hypertension: Secondary | ICD-10-CM | POA: Diagnosis not present

## 2015-01-20 DIAGNOSIS — N179 Acute kidney failure, unspecified: Secondary | ICD-10-CM | POA: Diagnosis not present

## 2015-01-21 ENCOUNTER — Other Ambulatory Visit: Payer: Self-pay | Admitting: Internal Medicine

## 2015-01-21 ENCOUNTER — Other Ambulatory Visit: Payer: Self-pay | Admitting: *Deleted

## 2015-01-21 ENCOUNTER — Encounter: Payer: Self-pay | Admitting: Gastroenterology

## 2015-01-21 NOTE — Patient Outreach (Signed)
First attempt made to contact pt for  transition of care, discharged 9/23.   HIPPA compliant voice message left with contact number.  If no response today, will try again tomorrow.     Zara Chess.   [facility] [facility]  [phone]

## 2015-01-22 ENCOUNTER — Other Ambulatory Visit: Payer: Self-pay | Admitting: *Deleted

## 2015-01-22 ENCOUNTER — Telehealth: Payer: Self-pay | Admitting: Internal Medicine

## 2015-01-22 ENCOUNTER — Encounter: Payer: Self-pay | Admitting: Internal Medicine

## 2015-01-22 ENCOUNTER — Telehealth: Payer: Self-pay | Admitting: *Deleted

## 2015-01-22 ENCOUNTER — Ambulatory Visit (INDEPENDENT_AMBULATORY_CARE_PROVIDER_SITE_OTHER): Payer: Commercial Managed Care - HMO | Admitting: Internal Medicine

## 2015-01-22 ENCOUNTER — Encounter: Payer: Self-pay | Admitting: Licensed Clinical Social Worker

## 2015-01-22 VITALS — BP 109/67 | HR 60 | Temp 98.1°F | Ht 61.0 in | Wt 290.6 lb

## 2015-01-22 DIAGNOSIS — R222 Localized swelling, mass and lump, trunk: Secondary | ICD-10-CM | POA: Diagnosis not present

## 2015-01-22 DIAGNOSIS — N179 Acute kidney failure, unspecified: Secondary | ICD-10-CM | POA: Diagnosis not present

## 2015-01-22 DIAGNOSIS — I10 Essential (primary) hypertension: Secondary | ICD-10-CM

## 2015-01-22 DIAGNOSIS — E119 Type 2 diabetes mellitus without complications: Secondary | ICD-10-CM | POA: Diagnosis not present

## 2015-01-22 DIAGNOSIS — I5033 Acute on chronic diastolic (congestive) heart failure: Secondary | ICD-10-CM

## 2015-01-22 DIAGNOSIS — I441 Atrioventricular block, second degree: Secondary | ICD-10-CM | POA: Diagnosis not present

## 2015-01-22 DIAGNOSIS — E118 Type 2 diabetes mellitus with unspecified complications: Secondary | ICD-10-CM

## 2015-01-22 DIAGNOSIS — E662 Morbid (severe) obesity with alveolar hypoventilation: Secondary | ICD-10-CM | POA: Diagnosis not present

## 2015-01-22 DIAGNOSIS — Z79899 Other long term (current) drug therapy: Secondary | ICD-10-CM

## 2015-01-22 DIAGNOSIS — J984 Other disorders of lung: Secondary | ICD-10-CM | POA: Diagnosis not present

## 2015-01-22 DIAGNOSIS — K580 Irritable bowel syndrome with diarrhea: Secondary | ICD-10-CM | POA: Diagnosis not present

## 2015-01-22 DIAGNOSIS — E1142 Type 2 diabetes mellitus with diabetic polyneuropathy: Secondary | ICD-10-CM | POA: Diagnosis not present

## 2015-01-22 LAB — GLUCOSE, CAPILLARY: GLUCOSE-CAPILLARY: 74 mg/dL (ref 65–99)

## 2015-01-22 MED ORDER — BUMETANIDE 1 MG PO TABS
3.0000 mg | ORAL_TABLET | Freq: Two times a day (BID) | ORAL | Status: DC
Start: 2015-01-22 — End: 2015-02-15

## 2015-01-22 NOTE — Telephone Encounter (Signed)
Call from patient forgot meter today in Clinics.  Patient also had a question about her Bumex that was ordered today.  [facility] [place]  [facility] [place] was informed that [facility] has billed for the medication already and that [facility] will be unable to fill the prescription and bill the patient for the medication.  Spoke with patient expaonlined to her what was said by the pharmacy.  Patient said that she uses [employer] for some of her meds but really needs to get the Bumex prescription sooner than the 10 days. Spoke with Bonnita Nasuti in Triage who will call the pharmacy to get order changed.

## 2015-01-22 NOTE — Telephone Encounter (Signed)
i took care of it

## 2015-01-22 NOTE — Assessment & Plan Note (Signed)
BP Readings from Last 3 Encounters:  01/22/15 109/67  01/18/15 100/42  10/18/14 121/69    Lab Results  Component Value Date   NA 141 01/18/2015   K 4.0 01/18/2015   CREATININE 1.27* 01/18/2015    Assessment: Blood pressure control:  well controlled Progress toward BP goal:   at goal Comments: compliant with meds  Plan: Medications:  continue current medications Educational resources provided:   Self management tools provided:   Other plans: Will check BMP

## 2015-01-22 NOTE — Assessment & Plan Note (Signed)
Lab Results  Component Value Date   HGBA1C 6.9* 01/11/2015   HGBA1C 8.7 10/18/2014   HGBA1C 8.0 06/07/2014     Assessment: Diabetes control:  well controlled Progress toward A1C goal:   at goal Comments: mildly hypoglycemic this AM but has not eaten breakfast  Plan: Medications:  continue current medications Home glucose monitoring: Frequency:   Timing:   Instruction/counseling given: reminded to bring blood glucose meter & log to each visit and reminded to bring medications to each visit Educational resources provided:   Self management tools provided:   Other plans: will check BMP today

## 2015-01-22 NOTE — Progress Notes (Signed)
Ms. Damo was referred to CSW as pt had questions regarding [employer] and her [employer] benefits.  CSW discussed with Ms. Hubach that most [facility] agency obtain a benefit check prior to initiating services.  [facility] is in-network for Ms. [place] plan.  Pt also inquired about transportation benefit that is available through [facility]  Currently, pt is using [employer], through her Medicaid.  However, she would like to access her [employer].  CSW was able to find the number with [employer] that supports Ms. [place] plan.  CSW contacted the number to confirm this number is correct.  Pt provided the number as she was leaving [facility] office.  [phone].  Encouraged Ms. Patteson to contact CSW once she has scheduled her 2 week follow up [facility] appointment, should she have any difficulty scheduling transportation.  CSW will also inquire about the [facility] [employer] program, as pt was instructed not to drive at this time.

## 2015-01-22 NOTE — Telephone Encounter (Signed)
I refilled bumex today. Can you ask her if she had an issue obtaining it?

## 2015-01-22 NOTE — Assessment & Plan Note (Signed)
-   Patient with AKI likely secondary to acute diastolic HF - Improved in hospital but not at baseline - Will recheck BMP today and c/w diuresis

## 2015-01-22 NOTE — Telephone Encounter (Signed)
Pt called requesting fluid pill to be filled.

## 2015-01-22 NOTE — Progress Notes (Signed)
   Subjective:    Patient ID: Kathleen [name], female    DOB: 28-Jun-1950, 64 y.o.   MRN: [id number]  HPI Patient seen and examined. Patient was recently admitted to [facility] hospital for acute on chronic diastolic HF exacerbation. She still complains of LE edema but slowly improving and also some abd distension which is slowly improving. No fevers. Complains of decreased appetite. She is also in the process of obtaining home care and [facility]. She had refused SNF placement while in the hospital. SOB has improved. No CP.   Review of Systems  Constitutional: Positive for chills and appetite change. Negative for fever, activity change and fatigue.  HENT: Negative.   Eyes: Negative.   Respiratory: Negative.  Negative for cough, shortness of breath, wheezing and stridor.   Cardiovascular: Positive for leg swelling. Negative for chest pain and palpitations.  Gastrointestinal: Positive for abdominal distention. Negative for abdominal pain, diarrhea and constipation.  Genitourinary: Negative for dysuria and difficulty urinating.  Musculoskeletal: Negative.   Neurological: Negative.   Psychiatric/Behavioral: Negative.        Objective:   Physical Exam  Constitutional: She is oriented to person, place, and time. She appears well-developed and well-nourished.  HENT:  Head: Normocephalic and atraumatic.  Eyes: Conjunctivae are normal.  Neck: Normal range of motion.  Cardiovascular: Normal rate, regular rhythm and normal heart sounds.   Pulmonary/Chest: Effort normal and breath sounds normal. No respiratory distress. She has no wheezes.  Abdominal: Soft. Bowel sounds are normal. There is no tenderness.  Mildly distended  Musculoskeletal: Normal range of motion. She exhibits edema.  2-3 + b/l LE pitting edema  Neurological: She is alert and oriented to person, place, and time.  Skin: Skin is warm and dry. No erythema.  Psychiatric: She has a normal mood and affect. Her  behavior is normal.          Assessment & Plan:  Please see problem based charting for assessment and plan:

## 2015-01-22 NOTE — Assessment & Plan Note (Signed)
-   Recently admitted for acute on chronic diastolic HF exacerbation. SOB has resolved - Weight has improved 9 lbs but still with extensive LE edema - Will c/w bumex - check BMP today

## 2015-01-22 NOTE — Telephone Encounter (Signed)
Thank you :)

## 2015-01-22 NOTE — Patient Instructions (Signed)
-   It was a pleasure seeing you today - We will check your kidney function and blood counts today - Continue with the bumex for now. If you have any difficulty obtaining this medication please let me know - Please follow up in 2 weeks

## 2015-01-22 NOTE — Patient Outreach (Signed)
Second attempt made to contact pt as part of transition of care (discharged 9/23).  HIPPA compliant voice message left with contact number.   If no response today, will f/u again tomorrow.     Zara Chess.   [place] [facility]  [phone]

## 2015-01-23 DIAGNOSIS — I441 Atrioventricular block, second degree: Secondary | ICD-10-CM | POA: Diagnosis not present

## 2015-01-23 DIAGNOSIS — I10 Essential (primary) hypertension: Secondary | ICD-10-CM | POA: Diagnosis not present

## 2015-01-23 DIAGNOSIS — I5033 Acute on chronic diastolic (congestive) heart failure: Secondary | ICD-10-CM | POA: Diagnosis not present

## 2015-01-23 DIAGNOSIS — J984 Other disorders of lung: Secondary | ICD-10-CM | POA: Diagnosis not present

## 2015-01-23 DIAGNOSIS — E1142 Type 2 diabetes mellitus with diabetic polyneuropathy: Secondary | ICD-10-CM | POA: Diagnosis not present

## 2015-01-23 DIAGNOSIS — R222 Localized swelling, mass and lump, trunk: Secondary | ICD-10-CM | POA: Diagnosis not present

## 2015-01-23 DIAGNOSIS — K580 Irritable bowel syndrome with diarrhea: Secondary | ICD-10-CM | POA: Diagnosis not present

## 2015-01-23 DIAGNOSIS — E662 Morbid (severe) obesity with alveolar hypoventilation: Secondary | ICD-10-CM | POA: Diagnosis not present

## 2015-01-23 DIAGNOSIS — N179 Acute kidney failure, unspecified: Secondary | ICD-10-CM | POA: Diagnosis not present

## 2015-01-23 LAB — BMP8+ANION GAP
ANION GAP: 20 mmol/L — AB (ref 10.0–18.0)
BUN/Creatinine Ratio: 29 — ABNORMAL HIGH (ref 11–26)
BUN: 41 mg/dL — ABNORMAL HIGH (ref 8–27)
CO2: 35 mmol/L — ABNORMAL HIGH (ref 18–29)
Calcium: 9.2 mg/dL (ref 8.7–10.3)
Chloride: 90 mmol/L — ABNORMAL LOW (ref 97–108)
Creatinine, Ser: 1.43 mg/dL — ABNORMAL HIGH (ref 0.57–1.00)
GFR, EST AFRICAN AMERICAN: 45 mL/min/{1.73_m2} — AB (ref >59)
GFR, EST NON AFRICAN AMERICAN: 39 mL/min/{1.73_m2} — AB (ref >59)
Glucose: 89 mg/dL (ref 65–99)
POTASSIUM: 3.9 mmol/L (ref 3.5–5.2)
SODIUM: 145 mmol/L — AB (ref 134–144)

## 2015-01-23 LAB — CBC WITH DIFFERENTIAL/PLATELET
Basophils Absolute: 0 10*3/uL (ref 0.0–0.2)
Basos: 1 %
EOS (ABSOLUTE): 0.2 10*3/uL (ref 0.0–0.4)
Eos: 3 %
Hematocrit: 34.1 % (ref 34.0–46.6)
Hemoglobin: 10.2 g/dL — ABNORMAL LOW (ref 11.1–15.9)
Immature Grans (Abs): 0 10*3/uL (ref 0.0–0.1)
Immature Granulocytes: 0 %
Lymphocytes Absolute: 1.7 10*3/uL (ref 0.7–3.1)
Lymphs: 27 %
MCH: 28.6 pg (ref 26.6–33.0)
MCHC: 29.9 g/dL — AB (ref 31.5–35.7)
MCV: 96 fL (ref 79–97)
MONOS ABS: 0.5 10*3/uL (ref 0.1–0.9)
Monocytes: 8 %
NEUTROS ABS: 3.8 10*3/uL (ref 1.4–7.0)
NEUTROS PCT: 61 %
PLATELETS: 165 10*3/uL (ref 150–379)
RBC: 3.57 x10E6/uL — ABNORMAL LOW (ref 3.77–5.28)
RDW: 15.9 % — AB (ref 12.3–15.4)
WBC: 6.2 10*3/uL (ref 3.4–10.8)

## 2015-01-24 ENCOUNTER — Other Ambulatory Visit: Payer: Self-pay | Admitting: *Deleted

## 2015-01-24 NOTE — Patient Outreach (Signed)
Third attempt made to contact pt for transition of care (discharged 9/23).  HIPPA compliant voice message left with contact number. If no response, will have unable to contact letter sent out and if do not hear back in 10 days, plan to have case closed/inform MD.      If no response, plan to have letter sent out.     Zara Chess.   [place] [facility]  [phone]

## 2015-01-25 DIAGNOSIS — I5033 Acute on chronic diastolic (congestive) heart failure: Secondary | ICD-10-CM | POA: Diagnosis not present

## 2015-01-25 DIAGNOSIS — J984 Other disorders of lung: Secondary | ICD-10-CM | POA: Diagnosis not present

## 2015-01-25 DIAGNOSIS — I10 Essential (primary) hypertension: Secondary | ICD-10-CM | POA: Diagnosis not present

## 2015-01-25 DIAGNOSIS — K580 Irritable bowel syndrome with diarrhea: Secondary | ICD-10-CM | POA: Diagnosis not present

## 2015-01-25 DIAGNOSIS — I441 Atrioventricular block, second degree: Secondary | ICD-10-CM | POA: Diagnosis not present

## 2015-01-25 DIAGNOSIS — R222 Localized swelling, mass and lump, trunk: Secondary | ICD-10-CM | POA: Diagnosis not present

## 2015-01-25 DIAGNOSIS — E1142 Type 2 diabetes mellitus with diabetic polyneuropathy: Secondary | ICD-10-CM | POA: Diagnosis not present

## 2015-01-25 DIAGNOSIS — N179 Acute kidney failure, unspecified: Secondary | ICD-10-CM | POA: Diagnosis not present

## 2015-01-25 DIAGNOSIS — E662 Morbid (severe) obesity with alveolar hypoventilation: Secondary | ICD-10-CM | POA: Diagnosis not present

## 2015-01-28 NOTE — Patient Outreach (Signed)
Transition of care call: Received a return phone call from pt to voice message left earlier by RN CM.  Pt states doing much better, went to see Dr. Dareen Piano 9/27, MD took her off Lasix, now on Bumex, to f/u with MD again in 2 weeks.  Pt states her sister is close by, checks on her.  Pt states does not have a scale, used to weigh 299. 8 lbs, was 290 lbs at MD office visit.  Pt states she talked to  [facility] RN, said she might qualify for a scale.  Pt states SW from [facility] is working on transportation with her plus was given number for the [place] transportation.  Pt states blood sugar today was 132.  RN CM discussed [facility] services for transition of care, f/u with weekly phone calls (31 days from day of discharge).      Plan to f/u again telephonically on 10/06 as part of ongoing transition of care.    Zara Chess.   [place] [facility]  [phone]

## 2015-01-29 DIAGNOSIS — E1142 Type 2 diabetes mellitus with diabetic polyneuropathy: Secondary | ICD-10-CM | POA: Diagnosis not present

## 2015-01-29 DIAGNOSIS — E662 Morbid (severe) obesity with alveolar hypoventilation: Secondary | ICD-10-CM | POA: Diagnosis not present

## 2015-01-29 DIAGNOSIS — I5033 Acute on chronic diastolic (congestive) heart failure: Secondary | ICD-10-CM | POA: Diagnosis not present

## 2015-01-29 DIAGNOSIS — N179 Acute kidney failure, unspecified: Secondary | ICD-10-CM | POA: Diagnosis not present

## 2015-01-29 DIAGNOSIS — R222 Localized swelling, mass and lump, trunk: Secondary | ICD-10-CM | POA: Diagnosis not present

## 2015-01-29 DIAGNOSIS — J984 Other disorders of lung: Secondary | ICD-10-CM | POA: Diagnosis not present

## 2015-01-29 DIAGNOSIS — K580 Irritable bowel syndrome with diarrhea: Secondary | ICD-10-CM | POA: Diagnosis not present

## 2015-01-29 DIAGNOSIS — I10 Essential (primary) hypertension: Secondary | ICD-10-CM | POA: Diagnosis not present

## 2015-01-29 DIAGNOSIS — I441 Atrioventricular block, second degree: Secondary | ICD-10-CM | POA: Diagnosis not present

## 2015-01-30 DIAGNOSIS — N179 Acute kidney failure, unspecified: Secondary | ICD-10-CM | POA: Diagnosis not present

## 2015-01-30 DIAGNOSIS — I5033 Acute on chronic diastolic (congestive) heart failure: Secondary | ICD-10-CM | POA: Diagnosis not present

## 2015-01-30 DIAGNOSIS — I441 Atrioventricular block, second degree: Secondary | ICD-10-CM | POA: Diagnosis not present

## 2015-01-30 DIAGNOSIS — I10 Essential (primary) hypertension: Secondary | ICD-10-CM | POA: Diagnosis not present

## 2015-01-30 DIAGNOSIS — K580 Irritable bowel syndrome with diarrhea: Secondary | ICD-10-CM | POA: Diagnosis not present

## 2015-01-30 DIAGNOSIS — E1142 Type 2 diabetes mellitus with diabetic polyneuropathy: Secondary | ICD-10-CM | POA: Diagnosis not present

## 2015-01-30 DIAGNOSIS — E662 Morbid (severe) obesity with alveolar hypoventilation: Secondary | ICD-10-CM | POA: Diagnosis not present

## 2015-01-30 DIAGNOSIS — J984 Other disorders of lung: Secondary | ICD-10-CM | POA: Diagnosis not present

## 2015-01-30 DIAGNOSIS — R222 Localized swelling, mass and lump, trunk: Secondary | ICD-10-CM | POA: Diagnosis not present

## 2015-01-31 ENCOUNTER — Other Ambulatory Visit: Payer: Commercial Managed Care - HMO | Admitting: *Deleted

## 2015-01-31 ENCOUNTER — Telehealth: Payer: Self-pay | Admitting: Internal Medicine

## 2015-01-31 DIAGNOSIS — I441 Atrioventricular block, second degree: Secondary | ICD-10-CM | POA: Diagnosis not present

## 2015-01-31 DIAGNOSIS — I5033 Acute on chronic diastolic (congestive) heart failure: Secondary | ICD-10-CM | POA: Diagnosis not present

## 2015-01-31 DIAGNOSIS — K580 Irritable bowel syndrome with diarrhea: Secondary | ICD-10-CM | POA: Diagnosis not present

## 2015-01-31 DIAGNOSIS — E662 Morbid (severe) obesity with alveolar hypoventilation: Secondary | ICD-10-CM | POA: Diagnosis not present

## 2015-01-31 DIAGNOSIS — J984 Other disorders of lung: Secondary | ICD-10-CM | POA: Diagnosis not present

## 2015-01-31 DIAGNOSIS — I10 Essential (primary) hypertension: Secondary | ICD-10-CM | POA: Diagnosis not present

## 2015-01-31 DIAGNOSIS — N179 Acute kidney failure, unspecified: Secondary | ICD-10-CM | POA: Diagnosis not present

## 2015-01-31 DIAGNOSIS — E1142 Type 2 diabetes mellitus with diabetic polyneuropathy: Secondary | ICD-10-CM | POA: Diagnosis not present

## 2015-01-31 DIAGNOSIS — R222 Localized swelling, mass and lump, trunk: Secondary | ICD-10-CM | POA: Diagnosis not present

## 2015-01-31 NOTE — Telephone Encounter (Signed)
Results of BMP discussed with [name]. She is noted to have an elevated AG to 20. Uncertain etiology. Patient states she feels much better and her LE edema has improved. Will f/u on Tuesday with Dr. Juleen China for repeat BMP and further w/u as warranted. Case d/w Dr. Juleen China as well.

## 2015-02-01 ENCOUNTER — Other Ambulatory Visit: Payer: Commercial Managed Care - HMO | Admitting: *Deleted

## 2015-02-05 ENCOUNTER — Ambulatory Visit (INDEPENDENT_AMBULATORY_CARE_PROVIDER_SITE_OTHER): Payer: Commercial Managed Care - HMO | Admitting: Internal Medicine

## 2015-02-05 ENCOUNTER — Encounter: Payer: Self-pay | Admitting: Internal Medicine

## 2015-02-05 VITALS — BP 100/44 | HR 44 | Temp 97.9°F | Wt 264.4 lb

## 2015-02-05 DIAGNOSIS — N179 Acute kidney failure, unspecified: Secondary | ICD-10-CM

## 2015-02-05 DIAGNOSIS — I5033 Acute on chronic diastolic (congestive) heart failure: Secondary | ICD-10-CM

## 2015-02-05 LAB — GLUCOSE, CAPILLARY: GLUCOSE-CAPILLARY: 104 mg/dL — AB (ref 65–99)

## 2015-02-05 NOTE — Assessment & Plan Note (Addendum)
-  AKI likely 2/2 acute HF exacerbation -BMP on 9/27 with elevated creatine of 1.43, increased from hospital discharge.  Also showed elevated AG of 20 with no obvious cause. -recheck BMP today.  Continue with Bumex at this point, but may need to reconsider dosage if AKI unimproved as patient has had significant diuresis to this point.  ADDENDUM 02/06/2015: BMP results show creatine elevated still at 1.64, AG 23.  Patient feeling well at office visit and without complaints.  Having strong diuresis on Bumex.  Will not make any medication changes yet.  Schedule patient for close follow up to be seen again in 2 weeks and recheck BMP.  If AG remains high, will begin to work up cause for elevated anion gap.

## 2015-02-05 NOTE — Patient Instructions (Signed)
Thank you for bringing your medicines today. This helps [place] keep you safe from mistakes.  We have NOT made any changes to your medications at this visit.  Diabetes Your diabetes is currently controlled Your goal is to have an A1c < 7.0, it was 6.9 in September 2016 Medicine Changes: none.  Please continue with your current meds. Homework: Continue to exercise regularly and eat a well-balanced diet.  If you monitor your blood sugars at home, please bring your meter to your next appointment. Call us if: you have any symptoms of hypoglycemia or hyperglycemia   Goals    . Blood Pressure < 140/90    . HEMOGLOBIN A1C < 7.0    . LDL CALC < 100    . Weight < 260 lb (117.935 kg)

## 2015-02-05 NOTE — Progress Notes (Signed)
Patient ID: Kathleen [name], female   DOB: February 04, 1951, 64 y.o.   MRN: [id number]   Subjective:   Patient ID: Kathleen [name] female   DOB: [birth year] 64 y.o.   MRN: [id number]  HPI: [name] is a 64 y.o. female with past medical history as described below.  She presents today after having recently been discharged from the hospital for acute on chronic diastolic HF exacerbation.  At initial follow up on 9/27, patient still complained of some lower extremity edema that was slowly improving.  BMP obtained that day showed an elevated anion gap of 20 with uncertain etiology.  She continues to feel very well since last [facility] visit.  States lower extremity edema has continued to improve.  She is experiencing no SOB or chest pain.    Past Medical History  Diagnosis Date  . Irritable bowel syndrome   . Fecal occult blood test positive   . Degenerative joint disease of hand   . Obesity   . Post-menopausal bleeding   . Dyslipidemia   . Diabetes mellitus   . Inadequate material resources   . Postmenopausal   . Hypertension   . CHF (congestive heart failure) ([place])   . Shortness of breath dyspnea   . GERD (gastroesophageal reflux disease)   . Headache    Current Outpatient Prescriptions  Medication Sig Dispense Refill  . ACCU-CHEK FASTCLIX LANCETS MISC TEST THREE TIMES DAILY 306 each 1  . ACCU-CHEK SMARTVIEW test strip TEST BLOOD SUGAR THREE TIMES DAILY 300 each 1  . amitriptyline (ELAVIL) 50 MG tablet Take 25 mg by mouth at bedtime.    Marland Kitchen aspirin 81 MG tablet Take 81 mg by mouth daily.    . bumetanide (BUMEX) 1 MG tablet Take 3 tablets (3 mg total) by mouth 2 (two) times daily. 180 tablet 0  . dicyclomine (BENTYL) 20 MG tablet TAKE 1 TABLET (20 MG TOTAL) BY MOUTH 3 (THREE) TIMES DAILY AS NEEDED FOR SPASMS. 90 tablet 1  . gabapentin (NEURONTIN) 300 MG capsule TAKE 1 CAPSULE THREE TIMES DAILY 270 capsule 2  . LEVEMIR 100 UNIT/ML injection INJECT 87 UNITS AT BEDTIME 30 mL 5  . losartan  (COZAAR) 50 MG tablet Take 1 tablet (50 mg total) by mouth daily. 90 tablet 3  . metFORMIN (GLUCOPHAGE) 1000 MG tablet TAKE 1 TABLET (1,000 MG TOTAL) BY MOUTH 2 (TWO) TIMES DAILY WITH A MEAL. 180 tablet 3  . NON FORMULARY Place 2 L into the nose daily. Wears with exertion and qhs    . omeprazole (PRILOSEC) 20 MG capsule Take 1 capsule (20 mg total) by mouth daily. 90 capsule 1   No current facility-administered medications for this visit.   Family History  Problem Relation Age of Onset  . Diabetes Mother   . Obesity Mother   . Stroke Father   . Diabetes Sister   . Colon cancer Neg Hx    Social History   Social History  . Marital Status: Divorced    Spouse Name: N/A  . Number of Children: 2  . Years of Education: N/A   Occupational History  . unemployed    Social History Main Topics  . Smoking status: Never Smoker   . Smokeless tobacco: Never Used  . Alcohol Use: No  . Drug Use: No  . Sexual Activity: Not Currently   Other Topics Concern  . None   Social History Narrative   Single.   Divorced x2.   Has 2 children, by two  different fathers.   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 May apply for disability/SSI given her pain in her hands which has made interviewing for jobs difficult.   Never Smoked   Alcohol use-no   Drug use-no   Regular exercise-yes      Financial assistance approved for 100% discount at [facility] and has [facility] card per [employer]   02/18/2010            Review of Systems: Review of Systems  Constitutional: Negative for fever and chills.  Eyes: Negative for blurred vision.  Respiratory: Negative for cough.   Cardiovascular: Positive for leg swelling. Negative for chest pain.  Gastrointestinal: Negative for nausea and vomiting.  Genitourinary: Negative for dysuria.  Musculoskeletal: Negative for back pain and falls.  Skin: Negative for rash.  Neurological: Negative for  dizziness and headaches.  Psychiatric/Behavioral: Negative for depression.     Objective:  Physical Exam: Filed Vitals:   02/05/15 1016  BP: 100/44  Pulse: 44  Temp: 97.9 F (36.6 C)  TempSrc: Oral  Weight: 264 lb 6.4 oz (119.931 kg)  SpO2: 94%   Physical Exam  Constitutional: She appears well-developed and well-nourished. No distress.  HENT:  Head: Normocephalic and atraumatic.  Eyes: EOM are normal.  Neck: Normal range of motion.  Cardiovascular: Regular rhythm.   Bradycardic, asymptomatic  Pulmonary/Chest: Effort normal and breath sounds normal.  Abdominal: Soft. Bowel sounds are normal.  Musculoskeletal: She exhibits edema (2+ bilateral edema of lower extremitites).  Skin: Skin is warm and dry.  Psychiatric: She has a normal mood and affect.    Assessment & Plan:   Please see Problem List for Assessment and Plan

## 2015-02-05 NOTE — Assessment & Plan Note (Signed)
-  Patient continues to have symptomatic improvement since discharge.  SOB has gotten much better no chest pain. -significant diuresis on Bumex.  Weight is down to 264 from 290 on 9/27.   -lower extremity edema still present but significantly improved per patient.  She also states no longer having pain in her legs related to the swelling. -continue with Bumex for now. -recheck BMP to follow electrolytes, creatine and AG on Bumex.  May need dose adjustment based on this result.

## 2015-02-06 ENCOUNTER — Other Ambulatory Visit: Payer: Self-pay | Admitting: *Deleted

## 2015-02-06 LAB — BMP8+ANION GAP
Anion Gap: 23 mmol/L — ABNORMAL HIGH (ref 10.0–18.0)
BUN/Creatinine Ratio: 21 (ref 11–26)
BUN: 35 mg/dL — ABNORMAL HIGH (ref 8–27)
CALCIUM: 9.8 mg/dL (ref 8.7–10.3)
CHLORIDE: 93 mmol/L — AB (ref 97–108)
CO2: 25 mmol/L (ref 18–29)
Creatinine, Ser: 1.64 mg/dL — ABNORMAL HIGH (ref 0.57–1.00)
GFR calc non Af Amer: 33 mL/min/{1.73_m2} — ABNORMAL LOW (ref >59)
GFR, EST AFRICAN AMERICAN: 38 mL/min/{1.73_m2} — AB (ref >59)
GLUCOSE: 83 mg/dL (ref 65–99)
Potassium: 5.2 mmol/L (ref 3.5–5.2)
Sodium: 141 mmol/L (ref 134–144)

## 2015-02-06 NOTE — Progress Notes (Signed)
Internal Medicine Clinic Attending  I saw and evaluated the patient.  I personally confirmed the key portions of the history and exam documented by Dr. Wallace and I reviewed pertinent patient test results.  The assessment, diagnosis, and plan were formulated together and I agree with the documentation in the resident's note. 

## 2015-02-06 NOTE — Patient Outreach (Signed)
Transition of care call. Pt is doing very well. She has lost 39 pounds since she first went into the hospital. She has seen her primary care provider. She is following the basic self care steps to take care of her heart failure: weighs daily, avoids salt, takes meds and walks. She is also watching what she eats as she is a type II diabetic. She does not know about carb counting.  I have emphasized to Kathleen [name] how important it is to call me or her MD if her wt goes up 3-5 pounds, if she has increasing edema and or SOB.  I am going to send her our Heart Failure packet and our CARB Counting Information.  I will call her again in one week.  [name] [facility] [phone]

## 2015-02-08 DIAGNOSIS — E1142 Type 2 diabetes mellitus with diabetic polyneuropathy: Secondary | ICD-10-CM | POA: Diagnosis not present

## 2015-02-08 DIAGNOSIS — I441 Atrioventricular block, second degree: Secondary | ICD-10-CM | POA: Diagnosis not present

## 2015-02-08 DIAGNOSIS — J984 Other disorders of lung: Secondary | ICD-10-CM | POA: Diagnosis not present

## 2015-02-08 DIAGNOSIS — I5033 Acute on chronic diastolic (congestive) heart failure: Secondary | ICD-10-CM | POA: Diagnosis not present

## 2015-02-08 DIAGNOSIS — N179 Acute kidney failure, unspecified: Secondary | ICD-10-CM | POA: Diagnosis not present

## 2015-02-08 DIAGNOSIS — E662 Morbid (severe) obesity with alveolar hypoventilation: Secondary | ICD-10-CM | POA: Diagnosis not present

## 2015-02-08 DIAGNOSIS — I10 Essential (primary) hypertension: Secondary | ICD-10-CM | POA: Diagnosis not present

## 2015-02-08 DIAGNOSIS — K580 Irritable bowel syndrome with diarrhea: Secondary | ICD-10-CM | POA: Diagnosis not present

## 2015-02-08 DIAGNOSIS — R222 Localized swelling, mass and lump, trunk: Secondary | ICD-10-CM | POA: Diagnosis not present

## 2015-02-13 ENCOUNTER — Other Ambulatory Visit: Payer: Self-pay | Admitting: *Deleted

## 2015-02-13 ENCOUNTER — Ambulatory Visit: Payer: Commercial Managed Care - HMO | Admitting: *Deleted

## 2015-02-13 NOTE — Patient Outreach (Signed)
Transition of care #3 - Pt states she is doing well. She is breathing normally. She only wears her O2 at night and prn during activity. She tells me today that she still does not have a scale. I understood or perceived on my last call that she did have a scale as she told me she had lost >30 pounds. She told me that her [facility] nurse had said she would try to get one for her. She says she cannot afford to pay for one. I told her I would call [facility] and find out what the status is on the scale and get back with her.  I called [facility] and found out that it did not appear that a scale had been requested. She said they sometimes work with [facility] to get scales for pts. I told her I would take care of getting this pt her necessary scale.  I called Kathie Rhodes, RN, [place] and asked her if she could please see pt ASAP and take her a scale. Rose agreed to see her on Friday at 2:00 pm. I advised Rose I would call pt back and let her know to expect her.  I called Mrs. Cofer back and advised her that Kalman Shan would like to come see her on Friday afternoon and bring her a scale. Pt is agreeable to this initial visit.  [name] [phone]

## 2015-02-14 ENCOUNTER — Telehealth: Payer: Self-pay

## 2015-02-14 DIAGNOSIS — I5033 Acute on chronic diastolic (congestive) heart failure: Secondary | ICD-10-CM | POA: Diagnosis not present

## 2015-02-14 DIAGNOSIS — I10 Essential (primary) hypertension: Secondary | ICD-10-CM | POA: Diagnosis not present

## 2015-02-14 DIAGNOSIS — R222 Localized swelling, mass and lump, trunk: Secondary | ICD-10-CM | POA: Diagnosis not present

## 2015-02-14 DIAGNOSIS — N179 Acute kidney failure, unspecified: Secondary | ICD-10-CM | POA: Diagnosis not present

## 2015-02-14 DIAGNOSIS — I441 Atrioventricular block, second degree: Secondary | ICD-10-CM | POA: Diagnosis not present

## 2015-02-14 DIAGNOSIS — J984 Other disorders of lung: Secondary | ICD-10-CM | POA: Diagnosis not present

## 2015-02-14 DIAGNOSIS — E662 Morbid (severe) obesity with alveolar hypoventilation: Secondary | ICD-10-CM | POA: Diagnosis not present

## 2015-02-14 DIAGNOSIS — E1142 Type 2 diabetes mellitus with diabetic polyneuropathy: Secondary | ICD-10-CM | POA: Diagnosis not present

## 2015-02-14 DIAGNOSIS — K580 Irritable bowel syndrome with diarrhea: Secondary | ICD-10-CM | POA: Diagnosis not present

## 2015-02-14 NOTE — Telephone Encounter (Signed)
VO for EKG given per Dr. Dareen Piano.  They will fax report to [place] once completed.   Pt unable to come to clinic for earlier appointment.  Importance of Tuesdays appointment confirmed.

## 2015-02-14 NOTE — Telephone Encounter (Signed)
Received call from [place] with [facility].  On visit with patient, current heart rate 40.  Patient is asymptomatic.   Per Rip Harbour, on recent visit with PT 10/14, HR recorded as 45, otherwise all other documentation indicates 50's. Rip Harbour says they are able to do EKG if we would like. Next apt in clinic 10/25  Please advise

## 2015-02-14 NOTE — Telephone Encounter (Signed)
Thank you Leigh.Marland KitchenMarland Kitchen

## 2015-02-15 ENCOUNTER — Encounter: Payer: Self-pay | Admitting: Internal Medicine

## 2015-02-15 ENCOUNTER — Encounter: Payer: Self-pay | Admitting: *Deleted

## 2015-02-15 ENCOUNTER — Other Ambulatory Visit: Payer: Self-pay | Admitting: Internal Medicine

## 2015-02-15 ENCOUNTER — Other Ambulatory Visit: Payer: Self-pay | Admitting: *Deleted

## 2015-02-15 DIAGNOSIS — I441 Atrioventricular block, second degree: Secondary | ICD-10-CM | POA: Insufficient documentation

## 2015-02-15 NOTE — Telephone Encounter (Signed)
Dr. Dareen Piano, Ekg is in your box for review, [facility] needs order they included signed and faxed back.

## 2015-02-15 NOTE — Assessment & Plan Note (Signed)
Asked to review EKG obtained [facility] home 2/2 HR in 40's. EKG mobitz I second degree AV block. Pt reportedly asymptomatic. CHF and non sustained VT on problem list. Pt has appt 25th for assessment and repeat EKG. EKG will be scanned into EMR

## 2015-02-15 NOTE — Telephone Encounter (Signed)
This is a duplicate.

## 2015-02-15 NOTE — Telephone Encounter (Signed)
Also given to Dr. Lynnae January for review.

## 2015-02-15 NOTE — Patient Outreach (Signed)
[facility]) Care Management   02/15/2015  Kathleen [name] 1951/04/09 [id number]  ROI CIARCIA is an 64 y.o. female  Subjective:  Pt  States [place] nurse is still coming, [facility] PT discharged.  Pt states [place] [facility] did an EKG yesterday because heart rate was irregular, no having any problems today.   Pt reports she saw Dr. Juleen China 10/11 and Dr. Dareen Piano to work on getting her a heart MD.   Pt reports  she received meals from [facility] (month's supply), appetite improving.  Pt states she is scheduled to have surgery on right eye (remove cataract, lens implant) on 10/25.    Objective:   Pleasant woman, no c/o pain.    ROS  Physical Exam  Constitutional: She is oriented to person, place, and time. She appears well-developed and well-nourished.  Musculoskeletal: Normal range of motion.  Neurological: She is alert and oriented to person, place, and time.  Psychiatric: She has a normal mood and affect. Her behavior is normal. Judgment and thought content normal.    Current Medications:  Reviewed with pt  Current Outpatient Prescriptions  Medication Sig Dispense Refill  . ACCU-CHEK FASTCLIX LANCETS MISC TEST THREE TIMES DAILY 306 each 1  . ACCU-CHEK SMARTVIEW test strip TEST BLOOD SUGAR THREE TIMES DAILY 300 each 1  . aspirin 81 MG tablet Take 81 mg by mouth daily.    Marland Kitchen dicyclomine (BENTYL) 20 MG tablet TAKE 1 TABLET (20 MG TOTAL) BY MOUTH 3 (THREE) TIMES DAILY AS NEEDED FOR SPASMS. 90 tablet 1  . gabapentin (NEURONTIN) 300 MG capsule TAKE 1 CAPSULE THREE TIMES DAILY 270 capsule 2  . LEVEMIR 100 UNIT/ML injection INJECT 87 UNITS AT BEDTIME 30 mL 5  . losartan (COZAAR) 50 MG tablet Take 1 tablet (50 mg total) by mouth daily. 90 tablet 3  . metFORMIN (GLUCOPHAGE) 1000 MG tablet TAKE 1 TABLET (1,000 MG TOTAL) BY MOUTH 2 (TWO) TIMES DAILY WITH A MEAL. 180 tablet 3  . Multiple Vitamins-Minerals (HEALTHY EYES PO) Take by mouth daily.    . NON FORMULARY Place 2 L into the nose daily. Wears  with exertion and qhs    . omeprazole (PRILOSEC) 20 MG capsule Take 1 capsule (20 mg total) by mouth daily. 90 capsule 1  . amitriptyline (ELAVIL) 50 MG tablet Take 25 mg by mouth at bedtime.    . bumetanide (BUMEX) 1 MG tablet TAKE 3 TABLETS BY MOUTH TWICE DAILY 180 tablet 0   No current facility-administered medications for this visit.    Functional Status:   In your present state of health, do you have any difficulty performing the following activities: 02/06/2015 02/05/2015  Hearing? - N  Vision? - N  Difficulty concentrating or making decisions? - N  Walking or climbing stairs? - Y  Dressing or bathing? - N  Doing errands, shopping? - Y  Preparing Food and eating ? N -  Using the Toilet? N -  In the past six months, have you accidently leaked urine? N -  Do you have problems with loss of bowel control? N -  Managing your Medications? N -  Managing your Finances? N -  Housekeeping or managing your Housekeeping? N -    Fall/Depression Screening:    PHQ 2/9 Scores 02/05/2015 10/18/2014 07/11/2014 06/07/2014 01/25/2014 10/23/2013 06/02/2013  PHQ - 2 Score 0 0 0 0 0 0 0    Assessment:  HF- provided pt with scale, had pt weigh during home visit- result 266.8lbs (clothes/shoes on, ate).  No c/o sob.   Pt uses O2 at night, as needed during the day.  Discussed with pt s/s of HF, importance of                         Weighing daily/recording (provided pt with [facility] calendar to record), call MD for a weight gain of 3 lbs                         A day/5 lbs in a week.                   Plan:  Pt to weigh daily/record/call MD for weight gain of 3 lbs in a day/5 lbs in a week            Pt scheduled for cataract surgery (right eye) 10/25.            Pt to f/u with Dr. Dareen Piano 11/22.              RN CM  to f/u with pt telephonically 10/24- last transition of care call.            Plan to provide pt with community nurse case management services after transition of care  program                 Completed.           Plan to inform Dr. Dareen Piano of [facility] involvement.    THN CM Care Plan Problem One        Most Recent Value   Care Plan Problem One  Recent hospitalization for Heart failure    Role Documenting the Problem One  Care Management Coordinator   Care Plan for Problem One  Active   THN Long Term Goal (31-90 days)  pt would not readmit within 31 days of discharge   THN Long Term Goal Start Date  01/24/15   Interventions for Problem One Long Term Goal  reviewed s/s to report to MD related to HF, discussed importance of weighing. plan to f/u with weekly transition of care calls 31 days post discharge   THN CM Short Term Goal #1 (0-30 days)  Pt to weigh daily/record in the next 7 days    THN CM Short Term Goal #1 Start Date  02/15/15   Interventions for Short Term Goal #1  Provided pt with a scale, demonstrated use. discussed to  weigh daily/record, call MD if wt gain of 3 lbs in a day, 5 lbs in a week    THN CM Short Term Goal #2 (0-30 days)  Ongoing compliance with a low Na+ diet within next 30 days    THN CM Short Term Goal #2 Start Date  01/24/15   Interventions for Short Term Goal #2  Discussed reading foods labels for sodium content      Rose M.   [place] Care Management  [phone]

## 2015-02-15 NOTE — Telephone Encounter (Signed)
Pt requesting Bumex to be filled.

## 2015-02-18 ENCOUNTER — Other Ambulatory Visit: Payer: Self-pay | Admitting: *Deleted

## 2015-02-18 NOTE — Patient Outreach (Signed)
Transition of care (final call):  Pt reports weights are good, 260 lbs today, yesterday 262 lbs.  Pt states she is loosing weight.  Pt states she has been on the treadmill.   Pt reports blood sugars good, check three times  A day, last one 124, sugars ranging 120-130.   Pt reports no sob, wheezing, chest pain, swelling.  Pt reports on recent EKG done- have not heard back about results yet.   With this being final transition of care call, discussed with pt doing a home visit next month (provide community nurse case management services) to which pt agreed.      Plan to f/u with pt again on 11/21- home visit.     Zara Chess.   [place] Care Management  [phone]

## 2015-02-19 ENCOUNTER — Ambulatory Visit: Payer: Commercial Managed Care - HMO | Admitting: Internal Medicine

## 2015-02-19 DIAGNOSIS — H2511 Age-related nuclear cataract, right eye: Secondary | ICD-10-CM | POA: Diagnosis not present

## 2015-02-21 ENCOUNTER — Telehealth: Payer: Self-pay | Admitting: *Deleted

## 2015-02-21 DIAGNOSIS — R222 Localized swelling, mass and lump, trunk: Secondary | ICD-10-CM | POA: Diagnosis not present

## 2015-02-21 DIAGNOSIS — J984 Other disorders of lung: Secondary | ICD-10-CM | POA: Diagnosis not present

## 2015-02-21 DIAGNOSIS — I5033 Acute on chronic diastolic (congestive) heart failure: Secondary | ICD-10-CM | POA: Diagnosis not present

## 2015-02-21 DIAGNOSIS — E1142 Type 2 diabetes mellitus with diabetic polyneuropathy: Secondary | ICD-10-CM | POA: Diagnosis not present

## 2015-02-21 DIAGNOSIS — N179 Acute kidney failure, unspecified: Secondary | ICD-10-CM | POA: Diagnosis not present

## 2015-02-21 DIAGNOSIS — K580 Irritable bowel syndrome with diarrhea: Secondary | ICD-10-CM | POA: Diagnosis not present

## 2015-02-21 DIAGNOSIS — I10 Essential (primary) hypertension: Secondary | ICD-10-CM | POA: Diagnosis not present

## 2015-02-21 DIAGNOSIS — I441 Atrioventricular block, second degree: Secondary | ICD-10-CM | POA: Diagnosis not present

## 2015-02-21 DIAGNOSIS — E662 Morbid (severe) obesity with alveolar hypoventilation: Secondary | ICD-10-CM | POA: Diagnosis not present

## 2015-02-21 NOTE — Telephone Encounter (Signed)
Rip Harbour from [place] called about pt pulse still 40 and regular. No c/o from pt. Talked with Dr Dareen Piano and states pt is in a heart block. Appt this week was canceled - needs an appt in clinic per Dr Dareen Piano. Problems with transportation - needs 3 days notice. Appt made 02/27/15 1:45PM Dr Merrilyn Puma. Pt will call if any changes. Hilda Blades Nevaeh Korte RN 02/21/15 11:50AM

## 2015-02-22 ENCOUNTER — Other Ambulatory Visit: Payer: Self-pay

## 2015-02-22 MED ORDER — ACCU-CHEK NANO SMARTVIEW W/DEVICE KIT: PACK | Status: DC

## 2015-02-22 NOTE — Telephone Encounter (Signed)
Received fax request from [place] for Accu-check nano smart view meter

## 2015-02-24 ENCOUNTER — Other Ambulatory Visit: Payer: Self-pay | Admitting: Internal Medicine

## 2015-02-27 ENCOUNTER — Encounter: Payer: Self-pay | Admitting: Internal Medicine

## 2015-02-27 ENCOUNTER — Ambulatory Visit (HOSPITAL_COMMUNITY)
Admission: RE | Admit: 2015-02-27 | Discharge: 2015-02-27 | Disposition: A | Discharge status: Home / Self Care | Payer: Commercial Managed Care - HMO | Source: Ambulatory Visit | Attending: Internal Medicine | Admitting: Internal Medicine

## 2015-02-27 ENCOUNTER — Telehealth: Payer: Self-pay | Admitting: Internal Medicine

## 2015-02-27 ENCOUNTER — Ambulatory Visit (INDEPENDENT_AMBULATORY_CARE_PROVIDER_SITE_OTHER): Payer: Commercial Managed Care - HMO | Admitting: Internal Medicine

## 2015-02-27 VITALS — BP 127/78 | HR 42 | Temp 98.3°F | Wt 259.9 lb

## 2015-02-27 DIAGNOSIS — I5033 Acute on chronic diastolic (congestive) heart failure: Secondary | ICD-10-CM | POA: Diagnosis not present

## 2015-02-27 DIAGNOSIS — R9431 Abnormal electrocardiogram [ECG] [EKG]: Secondary | ICD-10-CM | POA: Diagnosis not present

## 2015-02-27 DIAGNOSIS — I441 Atrioventricular block, second degree: Secondary | ICD-10-CM

## 2015-02-27 DIAGNOSIS — I5030 Unspecified diastolic (congestive) heart failure: Secondary | ICD-10-CM | POA: Diagnosis not present

## 2015-02-27 LAB — BASIC METABOLIC PANEL
ANION GAP: 11 (ref 5–15)
BUN: 69 mg/dL — AB (ref 6–20)
CO2: 32 mmol/L (ref 22–32)
Calcium: 9.6 mg/dL (ref 8.9–10.3)
Chloride: 98 mmol/L — ABNORMAL LOW (ref 101–111)
Creatinine, Ser: 1.78 mg/dL — ABNORMAL HIGH (ref 0.44–1.00)
GFR calc Af Amer: 34 mL/min — ABNORMAL LOW (ref >60)
GFR, EST NON AFRICAN AMERICAN: 29 mL/min — AB (ref >60)
GLUCOSE: 137 mg/dL — AB (ref 65–99)
Potassium: 4.6 mmol/L (ref 3.5–5.1)
Sodium: 141 mmol/L (ref 135–145)

## 2015-02-27 MED ORDER — BUMETANIDE 1 MG PO TABS: 2.0000 mg | ORAL_TABLET | Freq: Two times a day (BID) | ORAL | Status: DC

## 2015-02-27 NOTE — Assessment & Plan Note (Signed)
Patient denies any new symptoms, including dizziness, lightheadedness, increased fatigue, shortness of breath, chest pain, palpitations, or any other new symptoms or changes from her baseline. -EKG today unchanged from home EKG - 2nd degree Mobitz type I block -Patient is asymptomatic so no interventions or referrals warranted at this time -Repeat EKGs at subsequent visits if necessary, may consider cardiology consult if patient develops symptoms or heart block progresses

## 2015-02-27 NOTE — Assessment & Plan Note (Addendum)
Patients weights continue to decrease, down to 259 today from 264 at last visit and 290 from 9/27. Continued improvement in her LE edema, now has no pain whatsoever from it. She is able to exercise on her treadmill for 5-10 minutes at a time, occasionally using 2L O2, then resting, for a total of at least 30 minutes exercise per day. She is asymptomatic other than when exerting herself and exercising, and only requires oxygen during prolonged exercise, when cleaning as well as at night. -BMP today shows resolution of AG (no gap now - 11) but continued increase in creatinine to 1.78 from 1.64 last visit and 1.3 in September. -Continue with Bumex, but decrease from 3mg  BID (6mg  daily) to 2mg  BID (4mg  daily). -Re-check BMP at next visit with Dr. Dareen Piano on 11/22.

## 2015-02-27 NOTE — Telephone Encounter (Signed)
  Reason for call:   I placed an outgoing call to [name] at 5:30  PM regarding changing her bumex dose to two 1mg  pills BID (4mg  total) from her current 3mg  BID because of her continued increase in creatinine. She was unavailable, but I left a message at both her cell phone number and home phone.   Assessment/ Plan:   Decrease Bumex to 2mg  BID. Will call patient again tomorrow.  As always, pt is advised that if symptoms worsen or new symptoms arise, they should go to an urgent care facility or to to ER for further evaluation.   Norval Gable, MD   02/27/2015, 5:32 PM

## 2015-02-27 NOTE — Progress Notes (Signed)
   Subjective:    Patient ID: Kathleen [name], female    DOB: 07-11-1950, 64 y.o.   MRN: [id number]  HPI [name] is a 64 y.o. with PMH of diastolic CHF, HTN, and DM2 who presents to [facility] today for EKG re-check and CHF follow-up. Please see problem-based charting for further pertinent information.  Review of Systems  Constitutional: Positive for fatigue. Negative for fever, chills and diaphoresis.  HENT: Negative for ear pain, sinus pressure and sore throat.   Eyes: Negative for visual disturbance.  Respiratory: Positive for shortness of breath. Negative for cough and wheezing.   Cardiovascular: Positive for leg swelling. Negative for chest pain and palpitations.  Gastrointestinal: Negative for nausea, vomiting, abdominal pain, diarrhea, constipation and blood in stool.  Endocrine: Negative for polyuria.  Genitourinary: Negative for dysuria, urgency, frequency, flank pain, decreased urine volume and difficulty urinating.  Musculoskeletal: Negative for back pain and gait problem.  Skin: Negative for rash.  Neurological: Negative for dizziness, syncope, weakness, numbness and headaches.  Hematological: Does not bruise/bleed easily.  Psychiatric/Behavioral: Negative for confusion and agitation.  All other systems reviewed and are negative.      Objective:   Physical Exam  Constitutional: She is oriented to person, place, and time. She appears well-nourished. No distress.  HENT:  Head: Normocephalic and atraumatic.  Right Ear: External ear normal.  Left Ear: External ear normal.  Eyes: Conjunctivae are normal. Pupils are equal, round, and reactive to light.  Neck: Normal range of motion. Neck supple.  Cardiovascular: Regular rhythm and intact distal pulses.  Exam reveals no gallop and no friction rub.   Murmur heard. Bradycardic rate, regular rhythm. 2/6 non-radiation systolic murmur heard best at the LSB.  Pulmonary/Chest: Effort normal and breath sounds normal. She has no  wheezes. She has no rales.  Abdominal: Soft. Bowel sounds are normal. She exhibits no distension. There is no tenderness.  Musculoskeletal: Normal range of motion. She exhibits no edema or tenderness.  Neurological: She is alert and oriented to person, place, and time. She has normal reflexes. No cranial nerve deficit. Coordination normal.  Skin: Skin is warm. No rash noted. She is not diaphoretic. No erythema.  Psychiatric: She has a normal mood and affect. Her behavior is normal.  Nursing note and vitals reviewed.         Assessment & Plan:  Please see problem-based charting for assessment and plan.  Blane Ohara, MD Resident Physician, PGY-1 Department of Internal Medicine [facility]

## 2015-02-28 NOTE — Progress Notes (Signed)
Internal Medicine Clinic Attending  I saw and evaluated the patient.  I personally confirmed the key portions of the history and exam documented by Dr. Kennedy and I reviewed pertinent patient test results.  The assessment, diagnosis, and plan were formulated together and I agree with the documentation in the resident's note.  

## 2015-03-07 DIAGNOSIS — I10 Essential (primary) hypertension: Secondary | ICD-10-CM | POA: Diagnosis not present

## 2015-03-07 DIAGNOSIS — I441 Atrioventricular block, second degree: Secondary | ICD-10-CM | POA: Diagnosis not present

## 2015-03-07 DIAGNOSIS — R222 Localized swelling, mass and lump, trunk: Secondary | ICD-10-CM | POA: Diagnosis not present

## 2015-03-07 DIAGNOSIS — E662 Morbid (severe) obesity with alveolar hypoventilation: Secondary | ICD-10-CM | POA: Diagnosis not present

## 2015-03-07 DIAGNOSIS — I5033 Acute on chronic diastolic (congestive) heart failure: Secondary | ICD-10-CM | POA: Diagnosis not present

## 2015-03-07 DIAGNOSIS — E1142 Type 2 diabetes mellitus with diabetic polyneuropathy: Secondary | ICD-10-CM | POA: Diagnosis not present

## 2015-03-07 DIAGNOSIS — N179 Acute kidney failure, unspecified: Secondary | ICD-10-CM | POA: Diagnosis not present

## 2015-03-07 DIAGNOSIS — K580 Irritable bowel syndrome with diarrhea: Secondary | ICD-10-CM | POA: Diagnosis not present

## 2015-03-07 DIAGNOSIS — J984 Other disorders of lung: Secondary | ICD-10-CM | POA: Diagnosis not present

## 2015-03-11 ENCOUNTER — Ambulatory Visit (INDEPENDENT_AMBULATORY_CARE_PROVIDER_SITE_OTHER): Payer: Commercial Managed Care - HMO | Admitting: Ophthalmology

## 2015-03-13 ENCOUNTER — Ambulatory Visit (INDEPENDENT_AMBULATORY_CARE_PROVIDER_SITE_OTHER): Payer: Commercial Managed Care - HMO | Admitting: Podiatry

## 2015-03-13 ENCOUNTER — Encounter: Payer: Self-pay | Admitting: Podiatry

## 2015-03-13 DIAGNOSIS — M79676 Pain in unspecified toe(s): Secondary | ICD-10-CM | POA: Diagnosis not present

## 2015-03-13 DIAGNOSIS — B351 Tinea unguium: Secondary | ICD-10-CM | POA: Diagnosis not present

## 2015-03-13 NOTE — Progress Notes (Signed)
Patient ID: Kathleen [name], female   DOB: 1951/01/15, 64 y.o.   MRN: [id number]  Subjective: This patient presents again for schedule visit complaining of painful toenails and walking wearing shoes and is requesting nail debridement  Objective: Orientated 3 Open skin lesions bilaterally The toenails are hypertrophic, elongated, incurvated, discolored and tender direct palpation 6-10  Assessment: Diabetic Symptomatic onychomycoses 6-10  Plan: Debrided toenails 10 mechanically and electrically without any bleeding  Reappoint 3 months

## 2015-03-13 NOTE — Patient Instructions (Signed)
Diabetes and Foot Care Diabetes may cause you to have problems because of poor blood supply (circulation) to your feet and legs. This may cause the skin on your feet to become thinner, break easier, and heal more slowly. Your skin may become dry, and the skin may peel and crack. You may also have nerve damage in your legs and feet causing decreased feeling in them. You may not notice minor injuries to your feet that could lead to infections or more serious problems. Taking care of your feet is one of the most important things you can do for yourself.  HOME CARE INSTRUCTIONS  Wear shoes at all times, even in the house. Do not go barefoot. Bare feet are easily injured.  Check your feet daily for blisters, cuts, and redness. If you cannot see the bottom of your feet, use a mirror or ask someone for help.  Wash your feet with warm water (do not use hot water) and mild soap. Then pat your feet and the areas between your toes until they are completely dry. Do not soak your feet as this can dry your skin.  Apply a moisturizing lotion or petroleum jelly (that does not contain alcohol and is unscented) to the skin on your feet and to dry, brittle toenails. Do not apply lotion between your toes.  Trim your toenails straight across. Do not dig under them or around the cuticle. File the edges of your nails with an emery board or nail file.  Do not cut corns or calluses or try to remove them with medicine.  Wear clean socks or stockings every day. Make sure they are not too tight. Do not wear knee-high stockings since they may decrease blood flow to your legs.  Wear shoes that fit properly and have enough cushioning. To break in new shoes, wear them for just a few hours a day. This prevents you from injuring your feet. Always look in your shoes before you put them on to be sure there are no objects inside.  Do not cross your legs. This may decrease the blood flow to your feet.  If you find a minor scrape,  cut, or break in the skin on your feet, keep it and the skin around it clean and dry. These areas may be cleansed with mild soap and water. Do not cleanse the area with peroxide, alcohol, or iodine.  When you remove an adhesive bandage, be sure not to damage the skin around it.  If you have a wound, look at it several times a day to make sure it is healing.  Do not use heating pads or hot water bottles. They may burn your skin. If you have lost feeling in your feet or legs, you may not know it is happening until it is too late.  Make sure your health care provider performs a complete foot exam at least annually or more often if you have foot problems. Report any cuts, sores, or bruises to your health care provider immediately. SEEK MEDICAL CARE IF:   You have an injury that is not healing.  You have cuts or breaks in the skin.  You have an ingrown nail.  You notice redness on your legs or feet.  You feel burning or tingling in your legs or feet.  You have pain or cramps in your legs and feet.  Your legs or feet are numb.  Your feet always feel cold. SEEK IMMEDIATE MEDICAL CARE IF:   There is increasing redness,   swelling, or pain in or around a wound.  There is a red line that goes up your leg.  Pus is coming from a wound.  You develop a fever or as directed by your health care provider.  You notice a bad smell coming from an ulcer or wound.   This information is not intended to replace advice given to you by your health care provider. Make sure you discuss any questions you have with your health care provider.   Document Released: 04/10/2000 Document Revised: 12/14/2012 Document Reviewed: 09/20/2012 Elsevier Interactive Patient Education 2016 Elsevier Inc.  

## 2015-03-17 ENCOUNTER — Other Ambulatory Visit: Payer: Self-pay | Admitting: Internal Medicine

## 2015-03-18 ENCOUNTER — Other Ambulatory Visit: Payer: Self-pay | Admitting: *Deleted

## 2015-03-18 DIAGNOSIS — I5033 Acute on chronic diastolic (congestive) heart failure: Secondary | ICD-10-CM | POA: Diagnosis not present

## 2015-03-18 DIAGNOSIS — K580 Irritable bowel syndrome with diarrhea: Secondary | ICD-10-CM | POA: Diagnosis not present

## 2015-03-18 DIAGNOSIS — R222 Localized swelling, mass and lump, trunk: Secondary | ICD-10-CM | POA: Diagnosis not present

## 2015-03-18 DIAGNOSIS — J984 Other disorders of lung: Secondary | ICD-10-CM | POA: Diagnosis not present

## 2015-03-18 DIAGNOSIS — I441 Atrioventricular block, second degree: Secondary | ICD-10-CM | POA: Diagnosis not present

## 2015-03-18 DIAGNOSIS — E662 Morbid (severe) obesity with alveolar hypoventilation: Secondary | ICD-10-CM | POA: Diagnosis not present

## 2015-03-18 DIAGNOSIS — N179 Acute kidney failure, unspecified: Secondary | ICD-10-CM | POA: Diagnosis not present

## 2015-03-18 DIAGNOSIS — E1142 Type 2 diabetes mellitus with diabetic polyneuropathy: Secondary | ICD-10-CM | POA: Diagnosis not present

## 2015-03-18 DIAGNOSIS — I10 Essential (primary) hypertension: Secondary | ICD-10-CM | POA: Diagnosis not present

## 2015-03-19 ENCOUNTER — Encounter: Payer: Self-pay | Admitting: Internal Medicine

## 2015-03-19 ENCOUNTER — Ambulatory Visit (HOSPITAL_COMMUNITY)
Admission: RE | Admit: 2015-03-19 | Discharge: 2015-03-19 | Disposition: A | Discharge status: Home / Self Care | Payer: Commercial Managed Care - HMO | Source: Ambulatory Visit | Attending: Internal Medicine | Admitting: Internal Medicine

## 2015-03-19 ENCOUNTER — Ambulatory Visit (INDEPENDENT_AMBULATORY_CARE_PROVIDER_SITE_OTHER): Payer: Commercial Managed Care - HMO | Admitting: Internal Medicine

## 2015-03-19 VITALS — BP 107/41 | HR 48 | Temp 97.8°F | Ht 61.0 in | Wt 258.5 lb

## 2015-03-19 DIAGNOSIS — I441 Atrioventricular block, second degree: Secondary | ICD-10-CM | POA: Diagnosis not present

## 2015-03-19 DIAGNOSIS — I10 Essential (primary) hypertension: Secondary | ICD-10-CM | POA: Diagnosis not present

## 2015-03-19 DIAGNOSIS — N179 Acute kidney failure, unspecified: Secondary | ICD-10-CM | POA: Diagnosis not present

## 2015-03-19 DIAGNOSIS — I5033 Acute on chronic diastolic (congestive) heart failure: Secondary | ICD-10-CM

## 2015-03-19 LAB — BASIC METABOLIC PANEL
Anion gap: 8 (ref 5–15)
BUN: 70 mg/dL — AB (ref 6–20)
CALCIUM: 9.5 mg/dL (ref 8.9–10.3)
CO2: 31 mmol/L (ref 22–32)
CREATININE: 1.62 mg/dL — AB (ref 0.44–1.00)
Chloride: 100 mmol/L — ABNORMAL LOW (ref 101–111)
GFR calc non Af Amer: 33 mL/min — ABNORMAL LOW (ref >60)
GFR, EST AFRICAN AMERICAN: 38 mL/min — AB (ref >60)
Glucose, Bld: 101 mg/dL — ABNORMAL HIGH (ref 65–99)
Potassium: 3.7 mmol/L (ref 3.5–5.1)
SODIUM: 139 mmol/L (ref 135–145)

## 2015-03-19 LAB — GLUCOSE, CAPILLARY: GLUCOSE-CAPILLARY: 96 mg/dL (ref 65–99)

## 2015-03-19 NOTE — Assessment & Plan Note (Signed)
BP Readings from Last 3 Encounters:  03/19/15 107/41  03/18/15 100/70  02/27/15 127/78    Lab Results  Component Value Date   NA 139 03/19/2015   K 3.7 03/19/2015   CREATININE 1.62* 03/19/2015    Assessment: Blood pressure control:  well controlled Progress toward BP goal:   at goal Comments: patient compliant with meds  Plan: Medications:  continue current medications Educational resources provided: brochure (denied) Self management tools provided:   Other plans: will recheck BMP on next visit

## 2015-03-19 NOTE — Assessment & Plan Note (Signed)
Lab Results  Component Value Date   HGBA1C 6.9* 01/11/2015   HGBA1C 8.7 10/18/2014   HGBA1C 8.0 06/07/2014     Assessment: Diabetes control:  well controlled Progress toward A1C goal:   at goal Comments: patient is compliant with meds  Plan: Medications:  continue current medications Home glucose monitoring: Frequency:   Timing:   Instruction/counseling given: reminded to bring blood glucose meter & log to each visit and discussed the need for weight loss Educational resources provided: brochure (denied ) Self management tools provided: copy of home glucose meter download Other plans:

## 2015-03-19 NOTE — Patient Outreach (Signed)
[facility]) Care Management   Home visit 03/18/15  Kathleen [name] August 05, 1950 [id number]  Kathleen [name] is an 64 y.o. female  Subjective:  Pt reports weight today is 255.7 lbs , one day had a weight gain of  3 lbs/went out for [place] food.  Pt states to f/u with Dr. Dareen Piano tomorrow.  Pt states HH RN came today, last visit.  Pt states she is doing good with right eye cataract surgery,healed, to f/u with Dr. Amalia Hailey 11/29- recheck.  Pt reports since eye surgery, headaches are gone.  Pt states she is doing good with her carbohydrates, sugar today was 186(2 hours after eating).   Objective:   ROS  Physical Exam  Constitutional: She is oriented to person, place, and time. She appears well-developed and well-nourished.  Cardiovascular: Regular rhythm.   Pt reports HR runs low.   Respiratory: Breath sounds normal.  GI: Soft. Bowel sounds are normal.  Musculoskeletal: Normal range of motion.  Neurological: She is alert and oriented to person, place, and time.  Skin: Skin is warm and dry.  Psychiatric: She has a normal mood and affect. Her behavior is normal. Judgment and thought content normal.    Current Medications:  Reviewed medications with pt Current Outpatient Prescriptions  Medication Sig Dispense Refill  . ACCU-CHEK FASTCLIX LANCETS MISC TEST THREE TIMES DAILY 306 each 1  . ACCU-CHEK SMARTVIEW test strip TEST BLOOD SUGAR THREE TIMES DAILY 300 each 1  . aspirin 81 MG tablet Take 81 mg by mouth daily.    . Blood Glucose Monitoring Suppl (ACCU-CHEK NANO SMARTVIEW) W/DEVICE KIT Check blood sugar 3 times daily 1 kit 0  . bumetanide (BUMEX) 1 MG tablet Take 2 tablets (2 mg total) by mouth 2 (two) times daily. 120 tablet 3  . dicyclomine (BENTYL) 20 MG tablet TAKE 1 TABLET (20 MG TOTAL) BY MOUTH 3 (THREE) TIMES DAILY AS NEEDED FOR SPASMS. 90 tablet 1  . gabapentin (NEURONTIN) 300 MG capsule TAKE 1 CAPSULE THREE TIMES DAILY 270 capsule 2  . LEVEMIR 100 UNIT/ML  injection INJECT 87 UNITS AT BEDTIME 30 mL 5  . losartan (COZAAR) 50 MG tablet Take 1 tablet (50 mg total) by mouth daily. 90 tablet 3  . metFORMIN (GLUCOPHAGE) 1000 MG tablet TAKE 1 TABLET (1,000 MG TOTAL) BY MOUTH 2 (TWO) TIMES DAILY WITH A MEAL. 180 tablet 3  . Multiple Vitamins-Minerals (HEALTHY EYES PO) Take by mouth daily.    . NON FORMULARY Place 2 L into the nose daily. Wears with exertion and qhs    . omeprazole (PRILOSEC) 20 MG capsule Take 1 capsule (20 mg total) by mouth daily. 90 capsule 1  . amitriptyline (ELAVIL) 50 MG tablet Take 25 mg by mouth at bedtime.     No current facility-administered medications for this visit.    Functional Status:   In your present state of health, do you have any difficulty performing the following activities: 02/27/2015 02/06/2015  Hearing? N -  Vision? N -  Difficulty concentrating or making decisions? N -  Walking or climbing stairs? Y -  Dressing or bathing? N -  Doing errands, shopping? Y -  Conservation officer, nature and eating ? - N  Using the Toilet? - N  In the past six months, have you accidently leaked urine? - N  Do you have problems with loss of bowel control? - N  Managing your Medications? - N  Managing your Finances? - N  Housekeeping or managing your Housekeeping? -  N    Fall/Depression Screening:    PHQ 2/9 Scores 02/27/2015 02/05/2015 10/18/2014 07/11/2014 06/07/2014 01/25/2014 10/23/2013  PHQ - 2 Score 0 0 0 0 0 0 0    Assessment:   HF- as reported by pt, weight today 255.7 lbs , down from 10/21 home visit 266.8 lbs.   No c/o sob, chest pain, no edema noted.                             DM:  Blood sugar today 176 after eating.  View of pt's glucometer averages (7 day- 115, 14 day-103, 30 day-106)  Last A1C  6.8   Plan:  HF- pt to continue to weigh daily, record, ongoing compliance with Low Na+ diet.            DM- pt to continue to check blood sugars daily, ongoing compliance with diabetic diet (monitoring carbohydrates).   As  discussed with pt,  for the holidays-  pt to keep food portions small).           Plan to continue to follow pt with community nurse case management services, next home visit  12/21.            Plan to send Dr. Dareen Piano  11/21 encounter- route in Epic.    [facility]N CM Care Plan Problem Two        Most Recent Value   Care Plan Problem Two  Pt is a diabetic but doesn't know how to count carbs.   Role Documenting the Problem Two  Care Management Coordinator   Care Plan for Problem Two  Active   Interventions for Problem Two Long Term Goal   Discussed with pt to limit smoothies, add  vegetables    THN Long Term Goal (31-90) days  pt's A1c would come down 1/2 point in the next 31 days    THN Long Term Goal Start Date  03/18/15   [facility]N CM Short Term Goal #1 (0-30 days)  pt would loose one lb in the next 30 days    THN CM Short Term Goal #1 Start Date  03/18/15   Interventions for Short Term Goal #2   Discussed with pt having small portions during holidays     [facility] CM Care Plan Problem Three        Most Recent Value   Care Plan Problem Three  HF - ongoing self management    Role Documenting the Problem Three  Care Management Coordinator   Care Plan for Problem Three  Active   THN CM Short Term Goal #1 (0-30 days)  pt to limit sodium intake during the holidays    THN CM Short Term Goal #1 Start Date  03/18/15   Interventions for Short Term Goal #1  Discussed with pt to continue to read labels for sodium content, avoid canned/packed foods (high in sodium)     Zara Chess.   [facility]  [phone]

## 2015-03-19 NOTE — Patient Instructions (Signed)
-   It was a pleasure seeing you today - Your blood sugars are well controlled. Continue with your medications - Your BP is controlled as well. Continue with your medications - We will check your kidney function today - we have changed your bumex to 2 pills twice a day instead of 3 pills twice a day - F/u in 3 months - HAPPY HOLIDAYS!

## 2015-03-19 NOTE — Progress Notes (Signed)
   Subjective:    Patient ID: Kathleen [name], female    DOB: August 24, 1950, 64 y.o.   MRN: [id number]  HPI Patient seen and examined. Patient presents for follow up for her CHF and DM.  Patient states she feels almost back to normal after her CHF exacerbation. She was recently seen in [facility] and was noted to have worsening creatinine on her labs. She had also sufficiently diuresed and so her Bumex was decreased to 2 mg bid. She states that she was unaware of this and has been taking 3 mg bid. No SOB, LE swelling has almost completely resolved. No CP, no palpitations.   Patient was also noted to have Mobitz type 1 AV block on last visit. She denies lightheadedness, no syncope, no CP.  Patient states blood sugars have been well controlled. She is compliant with her diet and meds.    Review of Systems  Constitutional: Negative.   HENT: Negative.   Respiratory: Negative.   Cardiovascular: Positive for leg swelling.       Trace swelling  Gastrointestinal: Negative.   Musculoskeletal: Negative.   Neurological: Negative.   Psychiatric/Behavioral: Negative.        Objective:   Physical Exam  Constitutional: She is oriented to person, place, and time. She appears well-developed and well-nourished.  HENT:  Head: Normocephalic and atraumatic.  Cardiovascular: Normal rate, regular rhythm and normal heart sounds.   Pulmonary/Chest: Effort normal and breath sounds normal. No respiratory distress. She has no wheezes.  Abdominal: Soft. Bowel sounds are normal. She exhibits no distension. There is no tenderness.  Musculoskeletal: Normal range of motion. She exhibits edema. She exhibits no tenderness.  Trace b/l LE edema +  Neurological: She is alert and oriented to person, place, and time.  Skin: Skin is warm and dry.  Psychiatric: She has a normal mood and affect. Her behavior is normal.          Assessment & Plan:  Please see problem based charting for assessment and plan:

## 2015-03-19 NOTE — Assessment & Plan Note (Signed)
-   creatinine now slightly improved - will change bumex to 2 g bid - recheck BMP on next visit

## 2015-03-19 NOTE — Assessment & Plan Note (Signed)
-   Patient with Mobitz type 1 AV block - She remains asymptomatic - Repeat EKG with persistent Mobitz 1 block - Will consider cardio referral on next visit if persists

## 2015-03-19 NOTE — Assessment & Plan Note (Signed)
-   She is asymptomatic today. Denies SOB, only trace pedal edema present, no crackles on exam. Able to exercise for approx 10 min at a time - Patient still on 3mg  bumex bid. Will decrease to 2 mg bid today - BMP today shows mildly improved creatinine. Will recheck BMP on next visit  - c/w cozaar

## 2015-04-15 ENCOUNTER — Other Ambulatory Visit: Payer: Self-pay | Admitting: Internal Medicine

## 2015-04-17 ENCOUNTER — Other Ambulatory Visit: Payer: Self-pay | Admitting: *Deleted

## 2015-04-17 VITALS — BP 120/76 | HR 43 | Ht 61.0 in | Wt 264.4 lb

## 2015-04-17 DIAGNOSIS — E1169 Type 2 diabetes mellitus with other specified complication: Secondary | ICD-10-CM

## 2015-04-17 NOTE — Patient Outreach (Signed)
[facility]) Care Management   04/17/2015  Kathleen [name] Jan 13, 1951 [id number]  Kathleen [name] is an 64 y.o. female  Subjective: Pt reports f/u with Primary Care MD recently, good report.  Pt reports finished seeing Eye MD, doing well with both cataract surgeries.  Pt reports being shaky more this week, dizzy at times, sugar this am 59, recheck 98.  Pt reports her heart rate runs low, was low at recent MD visit, did EKG.   Objective:   Filed Vitals:   04/17/15 1159  BP: 120/76  Pulse: 43    ROS  Physical Exam  Constitutional: She is oriented to person, place, and time. She appears well-developed and well-nourished.  Cardiovascular:  Bradycardia   Respiratory: Effort normal and breath sounds normal.  GI: Soft.  Musculoskeletal: Normal range of motion.  Neurological: She is alert and oriented to person, place, and time.  Skin: Skin is warm and dry.  Psychiatric: She has a normal mood and affect. Her behavior is normal. Judgment and thought content normal.    Current Medications:  Reviewed with pt  Current Outpatient Prescriptions  Medication Sig Dispense Refill  . ACCU-CHEK FASTCLIX LANCETS MISC TEST THREE TIMES DAILY 306 each 1  . ACCU-CHEK SMARTVIEW test strip TEST BLOOD SUGAR THREE TIMES DAILY 300 each 1  . aspirin 81 MG tablet Take 81 mg by mouth daily.    . Blood Glucose Monitoring Suppl (ACCU-CHEK NANO SMARTVIEW) W/DEVICE KIT Check blood sugar 3 times daily 1 kit 0  . bumetanide (BUMEX) 1 MG tablet Take 2 tablets (2 mg total) by mouth 2 (two) times daily. 120 tablet 3  . gabapentin (NEURONTIN) 300 MG capsule TAKE 1 CAPSULE THREE TIMES DAILY 270 capsule 2  . LEVEMIR 100 UNIT/ML injection INJECT 87 UNITS AT BEDTIME 30 mL 5  . losartan (COZAAR) 50 MG tablet Take 1 tablet (50 mg total) by mouth daily. 90 tablet 3  . metFORMIN (GLUCOPHAGE) 1000 MG tablet TAKE 1 TABLET (1,000 MG TOTAL) BY MOUTH 2 (TWO) TIMES DAILY WITH A MEAL. 180 tablet 3  . Multiple  Vitamins-Minerals (HEALTHY EYES PO) Take by mouth daily.    . NON FORMULARY Place 2 L into the nose daily. Wears with exertion and qhs    . omeprazole (PRILOSEC) 20 MG capsule Take 1 capsule (20 mg total) by mouth daily. 90 capsule 1  . amitriptyline (ELAVIL) 50 MG tablet Take 25 mg by mouth at bedtime. Reported on 04/17/2015    . dicyclomine (BENTYL) 20 MG tablet TAKE 1 TABLET (20 MG TOTAL) BY MOUTH 3 (THREE) TIMES DAILY AS NEEDED FOR SPASMS. 90 tablet 1   No current facility-administered medications for this visit.    Functional Status:   In your present state of health, do you have any difficulty performing the following activities: 03/19/2015 02/27/2015  Hearing? N N  Vision? Y N  Difficulty concentrating or making decisions? N N  Walking or climbing stairs? - Y  Dressing or bathing? N N  Doing errands, shopping? N Y  Conservation officer, nature and eating ? - -  Using the Toilet? - -  In the past six months, have you accidently leaked urine? - -  Do you have problems with loss of bowel control? - -  Managing your Medications? - -  Managing your Finances? - -  Housekeeping or managing your Housekeeping? - -    Fall/Depression Screening:    PHQ 2/9 Scores 03/19/2015 02/27/2015 02/05/2015 10/18/2014 07/11/2014 06/07/2014 01/25/2014  PHQ -  2 Score 0 0 0 0 0 0 0    Assessment:  HF- weight today 264.4 lbs, ranges 254- 266.3 lbs.   No edema, reported sob.                          DM-  Sugar today 59 (fasting), repeat after eating 98.  Ranges per recordings am 59-233 (one time),                                   PM 62-168.   Averages 7 day-131, 14 day-116, 30 day- 107.    Plan:   Plan to discharge pt from Community Nurse case management services due to no  additional              Care coordination needs but does have educational needs and has agreed to telephonic health             Coach.              As discussed with pt, plan to send a health coach referral.  Care plan updated.             Plan to  send update letter to Dr. Dareen Piano via in basket in [place].     [facility]N CM Care Plan Problem One        Most Recent Value   Care Plan Problem One  Knowledge deficit related to diabetic diet (carbs, proteins)   Role Documenting the Problem One  Care Management Coordinator   Care Plan for Problem One  Active   THN Long Term Goal (31-90 days)  Pt would have better understanding of diabetic diet in the next 60-90 days    THN Long Term Goal Start Date  04/17/15   Interventions for Problem One Long Term Goal  Provided pt with Basic carb count sheet, reviewed portion sizes.    THN CM Short Term Goal #1 (0-30 days)  Pt would include protein with each meal, snack in the next 30 days    THN CM Short Term Goal #1 Start Date  04/17/15   Interventions for Short Term Goal #1  Discussed with pt importance of having protein with each meal, examples provided.    THN CM Short Term Goal #2 (0-30 days)  Pt would not have any low blood sugars in the next 30 days    THN CM Short Term Goal #2 Start Date  04/17/15   Interventions for Short Term Goal #2  Reviewed with pt sugar norms prior to eating, importance of protein with each meal,    THN CM Care Plan Problem Two        Most Recent Value   Care Plan Problem Two  Pt is a diabetic but doesn't know how to count carbs.   Role Documenting the Problem Two  Care Management Coordinator   Care Plan for Problem Two  Active   Interventions for Problem Two Long Term Goal   Discussed with pt to limit smoothies, add  vegetables    THN Long Term Goal Start Date  03/18/15   THN Long Term Goal Met Date  04/17/15   THN CM Short Term Goal #1 (0-30 days)  pt would loose one lb in the next 30 days    THN CM Short Term Goal #1 Start Date  03/18/15   [facility] CM Short Term Goal #  1 Met Date   04/17/15   Interventions for Short Term Goal #2   Discussed with pt having small portions during holidays     [facility] CM Care Plan Problem Three        Most Recent Value   Care Plan Problem Three  HF -  ongoing self management    Role Documenting the Problem Three  Care Management Coordinator   Care Plan for Problem Three  Active   THN CM Short Term Goal #1 (0-30 days)  pt to limit sodium intake during the holidays    THN CM Short Term Goal #1 Start Date  03/18/15   [facility] CM Short Term Goal #1 Met Date  04/17/15   Interventions for Short Term Goal #1  Discussed with pt to continue to read labels for sodium content, avoid canned/packed foods (high in sodium)      .THN                         THN CM Care Plan Problem One        Most Recent Value   Care Plan Problem One  Knowledge deficit related to diabetic diet (carbs, proteins)   Role Documenting the Problem One  Care Management Coordinator   Care Plan for Problem One  Active   THN Long Term Goal (31-90 days)  Pt would have better understanding of diabetic diet in the next 60-90 days    THN Long Term Goal Start Date  04/17/15   Interventions for Problem One Long Term Goal  Provided pt with Basic carb count sheet, reviewed portion sizes.    THN CM Short Term Goal #1 (0-30 days)  Pt would include protein with each meal, snack in the next 30 days    THN CM Short Term Goal #1 Start Date  04/17/15   Interventions for Short Term Goal #1  Discussed with pt importance of having protein with each meal, examples provided.    THN CM Short Term Goal #2 (0-30 days)  Pt would not have any low blood sugars in the next 30 days    THN CM Short Term Goal #2 Start Date  04/17/15   Interventions for Short Term Goal #2  Reviewed with pt sugar norms prior to eating, importance of protein with each meal,    THN CM Care Plan Problem Two        Most Recent Value   Care Plan Problem Two  Pt is a diabetic but doesn't know how to count carbs.   Role Documenting the Problem Two  Care Management Coordinator   Care Plan for Problem Two  Active   Interventions for Problem Two Long Term Goal   Discussed with pt to limit smoothies, add  vegetables    THN Long Term  Goal Start Date  03/18/15   THN Long Term Goal Met Date  04/17/15   [facility]N CM Short Term Goal #1 (0-30 days)  pt would loose one lb in the next 30 days    THN CM Short Term Goal #1 Start Date  03/18/15   [facility] CM Short Term Goal #1 Met Date   04/17/15   Interventions for Short Term Goal #2   Discussed with pt having small portions during holidays     [facility] CM Care Plan Problem Three        Most Recent Value   Care Plan Problem Three  HF - ongoing self management  Role Documenting the Problem Three  Care Management Coordinator   Care Plan for Problem Three  Active   THN CM Short Term Goal #1 (0-30 days)  pt to limit sodium intake during the holidays    THN CM Short Term Goal #1 Start Date  03/18/15   [facility] CM Short Term Goal #1 Met Date  04/17/15   Interventions for Short Term Goal #1  Discussed with pt to continue to read labels for sodium content, avoid canned/packed foods (high in sodium)      Zara Chess.   [place]  [phone]

## 2015-04-18 ENCOUNTER — Encounter: Payer: Self-pay | Admitting: *Deleted

## 2015-04-19 ENCOUNTER — Other Ambulatory Visit: Payer: Self-pay | Admitting: *Deleted

## 2015-04-19 NOTE — Patient Outreach (Signed)
[facility]) Care Management  04/19/2015  Kathleen [name] 1950-07-05 [id number]   RN Health Coach telephone call to patient.  [employer] compliance verified. RN Health Coach  introduced self to patient. Patient was very agreeable to follow up out reach calls. Patient was referred by  [facility] Nurse.  Plan: RN Health Coach will do follow up call within a month for assessment. Patient agreed to follow up call  [place] Management [phone]

## 2015-04-30 ENCOUNTER — Other Ambulatory Visit: Payer: Self-pay | Admitting: Internal Medicine

## 2015-05-14 ENCOUNTER — Other Ambulatory Visit: Payer: Self-pay | Admitting: Internal Medicine

## 2015-05-17 ENCOUNTER — Ambulatory Visit: Payer: Self-pay | Admitting: *Deleted

## 2015-05-17 ENCOUNTER — Encounter: Payer: Self-pay | Admitting: *Deleted

## 2015-05-17 ENCOUNTER — Other Ambulatory Visit: Payer: Self-pay | Admitting: *Deleted

## 2015-05-17 NOTE — Patient Outreach (Signed)
[place] [facility]) Care Management  [place]  05/17/2015   Kathleen [name] 07/18/1950 [id number]  Subjective: RN Health Coach telephone call to patient.  Hipaa compliance verified .Patient stated her health is good. Per patient she is exercising in a program at [employer] on a regular basis. Patient stated she does have some neuropathy in her lower extremities. Per patient she uses a cane at times. Per patient she is on oxygen at night and during moderate exertion she puts it on. Patient stated she is loosing wt. She has went from 299-261 pounds. Patient is able to state hyperglycemia reaction of feeling shaky and dizzy. Patient stated she drinks orange juice.  Patient blood sugar this am was 57.  Patient only had eaten a hs snack of an apple. At that time her blood sugar was 86 and she took  86 units of insulin. Explained to patient about eating a snack of peanut butter and gram cracker or cheese sandwich at hs. Per patient when her blood sugar is elevated she just pays attention to what she has eaten. Patient is checking her feet on a daily basis and she is seeing a podiatrist. Patient is very eager and excited about learning about her diabetes.   Objective:   Current Medications:  Current Outpatient Prescriptions  Medication Sig Dispense Refill  . ACCU-CHEK FASTCLIX LANCETS MISC TEST THREE TIMES DAILY 306 each 1  . ACCU-CHEK SMARTVIEW test strip TEST BLOOD SUGAR THREE TIMES DAILY 300 each 1  . amitriptyline (ELAVIL) 50 MG tablet Take 25 mg by mouth at bedtime. Reported on 04/17/2015    . aspirin 81 MG tablet Take 81 mg by mouth daily.    . Blood Glucose Monitoring Suppl (ACCU-CHEK NANO SMARTVIEW) W/DEVICE KIT Check blood sugar 3 times daily 1 kit 0  . bumetanide (BUMEX) 1 MG tablet Take 2 tablets (2 mg total) by mouth 2 (two) times daily. 120 tablet 3  . dicyclomine (BENTYL) 20 MG tablet TAKE 1 TABLET (20 MG TOTAL) BY MOUTH 3 (THREE) TIMES DAILY AS NEEDED FOR SPASMS. 90 tablet  1  . gabapentin (NEURONTIN) 300 MG capsule TAKE 1 CAPSULE THREE TIMES DAILY 270 capsule 2  . LEVEMIR 100 UNIT/ML injection INJECT 87 UNITS AT BEDTIME 30 mL 5  . losartan (COZAAR) 50 MG tablet Take 1 tablet (50 mg total) by mouth daily. 90 tablet 3  . metFORMIN (GLUCOPHAGE) 1000 MG tablet TAKE 1 TABLET (1,000 MG TOTAL) BY MOUTH 2 (TWO) TIMES DAILY WITH A MEAL. 180 tablet 3  . NON FORMULARY Place 2 L into the nose daily. Wears with exertion and qhs    . omeprazole (PRILOSEC) 20 MG capsule TAKE 1 CAPSULE (20 MG TOTAL) BY MOUTH DAILY. 90 capsule 1  . Multiple Vitamins-Minerals (HEALTHY EYES PO) Take by mouth daily. Reported on 05/17/2015     No current facility-administered medications for this visit.    Functional Status:  In your present state of health, do you have any difficulty performing the following activities: 05/17/2015 03/19/2015  Hearing? N N  Vision? N Y  Difficulty concentrating or making decisions? N N  Walking or climbing stairs? Y -  Dressing or bathing? N N  Doing errands, shopping? N N  Preparing Food and eating ? N -  Using the Toilet? N -  In the past six months, have you accidently leaked urine? N -  Do you have problems with loss of bowel control? N -  Managing your Medications? N -  Managing  your Finances? N -  Housekeeping or managing your Housekeeping? N -    Fall/Depression Screening: PHQ 2/9 Scores 05/17/2015 03/19/2015 02/27/2015 02/05/2015 10/18/2014 07/11/2014 06/07/2014  PHQ - 2 Score 0 0 0 0 0 0 0    Assessment:  Patient will benefit from [place] telephonic outreach for education and support for diabetes self management.  THN CM Care Plan Problem One        Most Recent Value   Care Plan Problem One  Knowledge deficit in self management of diabetes   Role Documenting the Problem One  [place] for Problem One  Active   THN Long Term Goal (31-90 days)  Patient will have a decrease of HGB A1C by one point in 90 days   THN Long Term  Goal Start Date  05/17/15   Interventions for Problem One [place] discussed with patient what the  A1C is.  RN discussed how A1C is affected by the blood sugars.  RN will send EMMI information on  Why get A1C checked. RN Health Coach will follow up within 30 days for  discussion and teach back.   THN CM Short Term Goal #1 (0-30 days)  Patient will be able to verbalize five symptoms of hypo and hyper glycemia within 30 days   THN CM Short Term Goal #1 Start Date  05/17/15   Interventions for Short Term Goal #1  RN discussed hypo and hyper glycemia reactions that  the patient is experiencing.  RN sent patient EMMI educational matierial on high and low blood sugar.    THN CM Short Term Goal #2 (0-30 days)  Patient will be able to verbalize an action plan for high and low blood sugar within 30 days   THN CM Short Term Goal #2 Start Date  05/17/15   Interventions for Short Term Goal #2  RN reviewed patient action plan  and discussed additional action that patient should be takening.  RNwill send EMMI educational material on self care.  RN will follow up with teach back   THN CM Short Term Goal #3 (0-30 days)  Patient will be able to verbalize foods high and low in carbohydrates within 30 days   THN CM Short Term Goal #3 Start Date  05/17/15   Interventions for Short Tern Goal #3  RN will send patient a picture chart on carbohydrates. RN will send EMMI information on counting carbohydrates. RN will follow up within a month    Plan: [place] will provide ongoing education for patient on diabetes through phone calls and sending printed information to patient for further discussion.  RN Health Coach will send EMMI on  Diabetes low blood sugar self care RN Health Coach will send EMMI on  Diabetes high blood sugar RN Health Coach will send EMMI on  Diabetes Diabetes care checklist RN Health Coach will send EMMI on  Diabetes Why get A1C checked [place] will send EMMI on   Diabetes When you are sick RN Health Coach will send EMMI on  Diabetes counting carbohydrates RN Health Coach will send EMMI on  Diabetes Diabetic Neuropathy  [place] Management [phone]

## 2015-05-28 ENCOUNTER — Other Ambulatory Visit: Payer: Self-pay | Admitting: Internal Medicine

## 2015-06-12 ENCOUNTER — Ambulatory Visit: Payer: Commercial Managed Care - HMO | Admitting: Podiatry

## 2015-06-13 ENCOUNTER — Other Ambulatory Visit: Payer: Self-pay | Admitting: *Deleted

## 2015-06-13 ENCOUNTER — Ambulatory Visit: Payer: Self-pay | Admitting: *Deleted

## 2015-06-18 ENCOUNTER — Encounter: Payer: Self-pay | Admitting: *Deleted

## 2015-06-18 NOTE — Patient Outreach (Addendum)
[place] [facility]) Care Management  [place]  06/13/2015  ASHANTIA AMARAL 1950/05/29 [id number]  Subjective: RN Health Coach telephone call to patient.  Hipaa compliance verified. Patient is very excited about health. Patient has started a [facility] exercise program. Patient is trying to watch carb intake. Patient blood sugar is 116. Patient states she is dizzy sometimes when she first stands up. Patient does not take her blood pressure because of no equipment.Per patient she does have a dry month a lot.  Patient is agreeable to follow up outreach.    Objective:   Current Medications:  Current Outpatient Prescriptions  Medication Sig Dispense Refill  . ACCU-CHEK FASTCLIX LANCETS MISC TEST THREE TIMES DAILY 306 each 1  . ACCU-CHEK SMARTVIEW test strip TEST BLOOD SUGAR THREE TIMES DAILY 300 each 1  . amitriptyline (ELAVIL) 50 MG tablet Take 25 mg by mouth at bedtime. Reported on 04/17/2015    . aspirin 81 MG tablet Take 81 mg by mouth daily.    . Blood Glucose Monitoring Suppl (ACCU-CHEK NANO SMARTVIEW) W/DEVICE KIT Check blood sugar 3 times daily 1 kit 0  . bumetanide (BUMEX) 1 MG tablet Take 2 tablets (2 mg total) by mouth 2 (two) times daily. 120 tablet 3  . dicyclomine (BENTYL) 20 MG tablet TAKE 1 TABLET (20 MG TOTAL) BY MOUTH 3 (THREE) TIMES DAILY AS NEEDED FOR SPASMS. 90 tablet 1  . gabapentin (NEURONTIN) 300 MG capsule TAKE 1 CAPSULE THREE TIMES DAILY 270 capsule 2  . LEVEMIR 100 UNIT/ML injection INJECT 87 UNITS AT BEDTIME 30 mL 5  . losartan (COZAAR) 50 MG tablet Take 1 tablet (50 mg total) by mouth daily. 90 tablet 3  . metFORMIN (GLUCOPHAGE) 1000 MG tablet TAKE 1 TABLET (1,000 MG TOTAL) BY MOUTH 2 (TWO) TIMES DAILY WITH A MEAL. 180 tablet 3  . Multiple Vitamins-Minerals (HEALTHY EYES PO) Take by mouth daily. Reported on 05/17/2015    . NON FORMULARY Place 2 L into the nose daily. Wears with exertion and qhs    . omeprazole (PRILOSEC) 20 MG capsule TAKE 1 CAPSULE  (20 MG TOTAL) BY MOUTH DAILY. 90 capsule 1   No current facility-administered medications for this visit.    Functional Status:  In your present state of health, do you have any difficulty performing the following activities: 06/13/2015 05/17/2015  Hearing? N N  Vision? - N  Difficulty concentrating or making decisions? N N  Walking or climbing stairs? Y Y  Dressing or bathing? Y N  Doing errands, shopping? N N  Preparing Food and eating ? - N  Using the Toilet? - N  In the past six months, have you accidently leaked urine? - N  Do you have problems with loss of bowel control? - N  Managing your Medications? - N  Managing your Finances? - N  Housekeeping or managing your Housekeeping? - N    Fall/Depression Screening: PHQ 2/9 Scores 06/13/2015 05/17/2015 03/19/2015 02/27/2015 02/05/2015 10/18/2014 07/11/2014  PHQ - 2 Score 0 0 0 0 0 0 0   THN CM Care Plan Problem One        Most Recent Value   Care Plan Problem One  Knowledge deficit in self management of diabetes   Role Documenting the Problem One  [place] for Problem One  Active   THN Long Term Goal (31-90 days)  Patient will have a decrease of HGB A1C by one point in 90 days   THN Long  Term Goal Start Date  05/17/15   Interventions for Problem One [place] discussed with patient what the  A1C is.  RN discussed how A1C is affected by the blood sugars.  RN will send EMMI information on  Why get A1C checked. RN Health Coach will follow up within 30 days for  discussion and teach back.   THN CM Short Term Goal #1 Start Date  05/17/15   Interventions for Short Term Goal #1  RN discussed hypo and hyper glycemia reactions that  the patient is experiencing.  RN sent patient EMMI educational matierial on high and low blood sugar.    THN CM Short Term Goal #2 (0-30 days)  Patient will be able to verbalize an action plan for high and low blood sugar within 30 days   THN CM Short Term Goal #2 Start Date   05/17/15   Interventions for Short Term Goal #2  RN reviewed patient action plan  and discussed additional action that patient should be takening.  RNwill send EMMI educational material on self care.  RN will follow up with teach back [patient is learning the proper action plans that need to be taken/ Patient needs reiterating]   THN CM Short Term Goal #3 (0-30 days)  Patient will be able to verbalize foods high and low in carbohydrates within 30 days   THN CM Short Term Goal #3 Start Date  05/17/15 [not met pt is working on learning to count her carbs]   Interventions for Short Tern Goal #3  RN will send patient a picture chart on carbohydrates. RN will send EMMI information on counting carbohydrates. RN will follow up within a month   THN CM Short Term Goal #4 (0-30 days)  Patient will maintain a routine exercise plan within the next 30 days   THN CM Short Term Goal #4 Start Date  06/18/15   Interventions for Short Term Goal #4  Patient has started [facility] to [facility]. RN will encourage patient to continue. RN will send patient EMMI education on Diabetes and Exercise.      Assessment:  Patient needs Blood pressure monitor Patient needs Encouragement to continue Exercise program Patient will continue to benefit from [facility] telephonic outreach for education and support for diabetes self management Plan RN will send EMMI information on Diabetes and Exercise RN will send EMMI information on Diabetes and blood pressure RN will send EMMI information on Understanding water pills and thirst RN Health Coach  will send patient a blood pressure monitor RN Health Coach will provide ongoing education for patient on diabetes through phone calls and sending printed information for further discussion.   Johny Shock, BSN, RN [facility] Care Management RN Health Coach Phone: [place] complies with applicable Federal civil rights laws and does not discriminate on  the basis of race, color, national origin, age, disability, or sex. Espaol (Spanish)  [place] cumple con las leyes federales de derechos civiles aplicables y no discrimina por motivos de raza, color, nacionalidad, edad, discapacidad o sexo.    Ti?[place] Vi?t ([place])  [place] tun th? lu?t dn quy?n hi?n hnh c?a Lin bang v khng phn bi?t ?i x? d?a trn ch?ng t?c, mu da, ngu?n g?c qu?c gia, ? tu?i, khuy?t t?t, ho?c gi?i tnh.    (Arabic)    [place]                      .

## 2015-06-19 ENCOUNTER — Ambulatory Visit: Payer: Commercial Managed Care - HMO | Admitting: Podiatry

## 2015-07-01 ENCOUNTER — Telehealth: Payer: Self-pay | Admitting: Internal Medicine

## 2015-07-01 NOTE — Telephone Encounter (Signed)
APPT REMINDER CALL, LMTCB IF SHE NEEDS TO CANCEL °

## 2015-07-02 ENCOUNTER — Ambulatory Visit (INDEPENDENT_AMBULATORY_CARE_PROVIDER_SITE_OTHER): Payer: Commercial Managed Care - HMO | Admitting: Internal Medicine

## 2015-07-02 ENCOUNTER — Ambulatory Visit (HOSPITAL_COMMUNITY)
Admission: RE | Admit: 2015-07-02 | Discharge: 2015-07-02 | Disposition: A | Discharge status: Home / Self Care | Payer: Commercial Managed Care - HMO | Source: Ambulatory Visit | Attending: Internal Medicine | Admitting: Internal Medicine

## 2015-07-02 ENCOUNTER — Other Ambulatory Visit: Payer: Self-pay

## 2015-07-02 ENCOUNTER — Encounter: Payer: Self-pay | Admitting: Internal Medicine

## 2015-07-02 VITALS — BP 161/102 | HR 48 | Temp 97.6°F | Ht 60.0 in | Wt 265.9 lb

## 2015-07-02 DIAGNOSIS — I441 Atrioventricular block, second degree: Secondary | ICD-10-CM

## 2015-07-02 DIAGNOSIS — Z794 Long term (current) use of insulin: Secondary | ICD-10-CM | POA: Diagnosis not present

## 2015-07-02 DIAGNOSIS — E669 Obesity, unspecified: Secondary | ICD-10-CM

## 2015-07-02 DIAGNOSIS — N179 Acute kidney failure, unspecified: Secondary | ICD-10-CM | POA: Diagnosis not present

## 2015-07-02 DIAGNOSIS — Z1231 Encounter for screening mammogram for malignant neoplasm of breast: Secondary | ICD-10-CM

## 2015-07-02 DIAGNOSIS — I10 Essential (primary) hypertension: Secondary | ICD-10-CM

## 2015-07-02 DIAGNOSIS — E119 Type 2 diabetes mellitus without complications: Secondary | ICD-10-CM | POA: Diagnosis not present

## 2015-07-02 DIAGNOSIS — R9431 Abnormal electrocardiogram [ECG] [EKG]: Secondary | ICD-10-CM | POA: Diagnosis not present

## 2015-07-02 DIAGNOSIS — Z6841 Body Mass Index (BMI) 40.0 and over, adult: Secondary | ICD-10-CM

## 2015-07-02 DIAGNOSIS — I5033 Acute on chronic diastolic (congestive) heart failure: Secondary | ICD-10-CM

## 2015-07-02 DIAGNOSIS — I110 Hypertensive heart disease with heart failure: Secondary | ICD-10-CM

## 2015-07-02 DIAGNOSIS — E1169 Type 2 diabetes mellitus with other specified complication: Secondary | ICD-10-CM | POA: Diagnosis not present

## 2015-07-02 DIAGNOSIS — Z0000 Encounter for general adult medical examination without abnormal findings: Secondary | ICD-10-CM

## 2015-07-02 DIAGNOSIS — Z79899 Other long term (current) drug therapy: Secondary | ICD-10-CM

## 2015-07-02 LAB — POCT GLYCOSYLATED HEMOGLOBIN (HGB A1C): HEMOGLOBIN A1C: 7.3

## 2015-07-02 LAB — GLUCOSE, CAPILLARY: GLUCOSE-CAPILLARY: 75 mg/dL (ref 65–99)

## 2015-07-02 MED ORDER — AMITRIPTYLINE HCL 50 MG PO TABS: 25.0000 mg | ORAL_TABLET | Freq: Every day | ORAL | Status: DC

## 2015-07-02 MED ORDER — ACCU-CHEK FASTCLIX LANCETS MISC: Status: DC

## 2015-07-02 MED ORDER — DICYCLOMINE HCL 20 MG PO TABS: ORAL_TABLET | ORAL | Status: DC

## 2015-07-02 MED ORDER — GLUCOSE BLOOD VI STRP: ORAL_STRIP | Status: DC

## 2015-07-02 MED ORDER — "INSULIN SYRINGE-NEEDLE U-100 31G X 15/64"" 1 ML MISC": Status: DC

## 2015-07-02 MED ORDER — INSULIN DETEMIR 100 UNIT/ML ~~LOC~~ SOLN: 82.0000 [IU] | Freq: Every day | SUBCUTANEOUS | Status: DC

## 2015-07-02 MED ORDER — BUMETANIDE 1 MG PO TABS: 2.0000 mg | ORAL_TABLET | Freq: Two times a day (BID) | ORAL | Status: DC

## 2015-07-02 MED ORDER — GABAPENTIN 300 MG PO CAPS: ORAL_CAPSULE | ORAL | Status: DC

## 2015-07-02 MED ORDER — LOSARTAN POTASSIUM 50 MG PO TABS: 50.0000 mg | ORAL_TABLET | Freq: Every day | ORAL | Status: DC

## 2015-07-02 NOTE — Patient Instructions (Signed)
-   It was a pleasure seeing you - We will check your blood work today today to evaluate your cholesterol and kidney function - We will check an EKG today - Keep up the diet and the exercise - We will decrease your levemir to 82 units

## 2015-07-03 LAB — BMP8+ANION GAP
ANION GAP: 21 mmol/L — AB (ref 10.0–18.0)
BUN/Creatinine Ratio: 47 — ABNORMAL HIGH (ref 11–26)
BUN: 74 mg/dL — ABNORMAL HIGH (ref 8–27)
CO2: 22 mmol/L (ref 18–29)
Calcium: 9.4 mg/dL (ref 8.7–10.3)
Chloride: 103 mmol/L (ref 96–106)
Creatinine, Ser: 1.56 mg/dL — ABNORMAL HIGH (ref 0.57–1.00)
GFR calc Af Amer: 40 mL/min/{1.73_m2} — ABNORMAL LOW (ref >59)
GFR, EST NON AFRICAN AMERICAN: 35 mL/min/{1.73_m2} — AB (ref >59)
Glucose: 104 mg/dL — ABNORMAL HIGH (ref 65–99)
POTASSIUM: 4.3 mmol/L (ref 3.5–5.2)
SODIUM: 146 mmol/L — AB (ref 134–144)

## 2015-07-03 LAB — LIPID PANEL
CHOL/HDL RATIO: 3.4 ratio (ref 0.0–4.4)
Cholesterol, Total: 134 mg/dL (ref 100–199)
HDL: 39 mg/dL — ABNORMAL LOW (ref >39)
LDL Calculated: 79 mg/dL (ref 0–99)
TRIGLYCERIDES: 78 mg/dL (ref 0–149)
VLDL Cholesterol Cal: 16 mg/dL (ref 5–40)

## 2015-07-03 NOTE — Assessment & Plan Note (Signed)
BP Readings from Last 3 Encounters:  07/02/15 161/102  04/17/15 120/76  03/19/15 107/41    Lab Results  Component Value Date   NA 146* 07/02/2015   K 4.3 07/02/2015   CREATININE 1.56* 07/02/2015    Assessment: Blood pressure control:   Progress toward BP goal:    Comments: repeat BP 116/49 which is at goal  Plan: Medications:  continue current medications Educational resources provided: brochure (denies) Self management tools provided:   Other plans:

## 2015-07-03 NOTE — Assessment & Plan Note (Signed)
-   Patient was almost 300 lbs back in September  - currently she is around 265 lbs - She is consistent with her exercise program. Encouraged her to continue her diet and exercise

## 2015-07-03 NOTE — Assessment & Plan Note (Signed)
-   Patient is much improved today - She is compliant with her meds. No SOB, no edema, normal lung exam - Has resumed exercising - BMP with mildly improved creatinine but increased BUN and Na. Possibly a little over diuresed - Will consider decreasing bumex to 1mg  BID

## 2015-07-03 NOTE — Assessment & Plan Note (Signed)
-   Patient with mobitz type 1 AV block on previous EKG - repeat EKG done today with persistent Mobitz 1 - Will consider referral to cardiology but no urgent intervention

## 2015-07-03 NOTE — Assessment & Plan Note (Signed)
-   scheduled mammogram for patient

## 2015-07-03 NOTE — Progress Notes (Signed)
   Subjective:    Patient ID: Kathleen [name], female    DOB: 06/21/1950, 65 y.o.   MRN: [id number]  HPI Patient seen and examined. Patient is here for follow up of her HTN, DM and congestive heart failure. She feels well today with no new complaints.   Review of Systems  Constitutional: Negative.   HENT: Negative.   Eyes: Negative.   Respiratory: Negative.   Cardiovascular: Negative.   Gastrointestinal: Negative.   Musculoskeletal: Negative.   Skin: Negative.   Neurological: Negative.   Psychiatric/Behavioral: Negative.        Objective:   Physical Exam  Constitutional: She is oriented to person, place, and time. She appears well-developed and well-nourished.  HENT:  Head: Normocephalic and atraumatic.  Cardiovascular: Normal rate and regular rhythm.   Pulmonary/Chest: Effort normal and breath sounds normal. No respiratory distress. She has no wheezes. She has no rales.  Abdominal: Soft. Bowel sounds are normal. She exhibits no distension. There is no tenderness.  Musculoskeletal: Normal range of motion. She exhibits no edema or tenderness.  Neurological: She is alert and oriented to person, place, and time.  Skin: Skin is warm and dry. No rash noted. No erythema.  Psychiatric: She has a normal mood and affect. Her behavior is normal.          Assessment & Plan:  Please see problem based charting for assessment and plan:

## 2015-07-03 NOTE — Assessment & Plan Note (Addendum)
Lab Results  Component Value Date   HGBA1C 7.3 07/02/2015   HGBA1C 6.9* 01/11/2015   HGBA1C 8.7 10/18/2014     Assessment: Diabetes control:  well controlled Progress toward A1C goal:   near goal Comments: patient is compliant with meds. Noted to have early morning lows (lowest 47)  Plan: Medications:  Will decrease levemir to 82 units Home glucose monitoring: Frequency:   Timing:   Instruction/counseling given: reminded to bring blood glucose meter & log to each visit and discussed diet Educational resources provided: brochure (denies) Self management tools provided: copy of home glucose meter download, home glucose logbook Other plans: BMP rechecked today. Lipid profile rechecked

## 2015-07-03 NOTE — Assessment & Plan Note (Signed)
-   creatinine slightly improved - She appears at this point to have CKD. Will monitor closely and consider referral to nephrology in the near future - Will decrease bumex to 1 mg BID

## 2015-07-04 ENCOUNTER — Telehealth: Payer: Self-pay | Admitting: Internal Medicine

## 2015-07-04 MED ORDER — BUMETANIDE 1 MG PO TABS: 1.0000 mg | ORAL_TABLET | Freq: Two times a day (BID) | ORAL | Status: DC

## 2015-07-04 NOTE — Telephone Encounter (Signed)
Results of blood work discussed with patient. Noted to have stable creatinine but increasing BUN and increasing Na. Possibly a little over diuresed. Instructed patient to decrease her bumex to 1 mg bid. Will recheck her BMP on follow up

## 2015-07-06 ENCOUNTER — Other Ambulatory Visit: Payer: Self-pay | Admitting: Internal Medicine

## 2015-07-09 NOTE — Telephone Encounter (Signed)
Looks like the updated script went to [facility] order, [facility] had just gotten new script on 3/7 but I have updated the directions with them so that they have current rx on file for future refills.  Tried to call patient to reiterate directions for 1 pill twice daily but unable to reach her.

## 2015-07-10 ENCOUNTER — Ambulatory Visit (INDEPENDENT_AMBULATORY_CARE_PROVIDER_SITE_OTHER): Payer: Commercial Managed Care - HMO | Admitting: Podiatry

## 2015-07-10 ENCOUNTER — Encounter: Payer: Self-pay | Admitting: Podiatry

## 2015-07-10 DIAGNOSIS — M79676 Pain in unspecified toe(s): Secondary | ICD-10-CM

## 2015-07-10 DIAGNOSIS — B351 Tinea unguium: Secondary | ICD-10-CM

## 2015-07-10 NOTE — Patient Instructions (Signed)
Diabetes and Foot Care Diabetes may cause you to have problems because of poor blood supply (circulation) to your feet and legs. This may cause the skin on your feet to become thinner, break easier, and heal more slowly. Your skin may become dry, and the skin may peel and crack. You may also have nerve damage in your legs and feet causing decreased feeling in them. You may not notice minor injuries to your feet that could lead to infections or more serious problems. Taking care of your feet is one of the most important things you can do for yourself.  HOME CARE INSTRUCTIONS  Wear shoes at all times, even in the house. Do not go barefoot. Bare feet are easily injured.  Check your feet daily for blisters, cuts, and redness. If you cannot see the bottom of your feet, use a mirror or ask someone for help.  Wash your feet with warm water (do not use hot water) and mild soap. Then pat your feet and the areas between your toes until they are completely dry. Do not soak your feet as this can dry your skin.  Apply a moisturizing lotion or petroleum jelly (that does not contain alcohol and is unscented) to the skin on your feet and to dry, brittle toenails. Do not apply lotion between your toes.  Trim your toenails straight across. Do not dig under them or around the cuticle. File the edges of your nails with an emery board or nail file.  Do not cut corns or calluses or try to remove them with medicine.  Wear clean socks or stockings every day. Make sure they are not too tight. Do not wear knee-high stockings since they may decrease blood flow to your legs.  Wear shoes that fit properly and have enough cushioning. To break in new shoes, wear them for just a few hours a day. This prevents you from injuring your feet. Always look in your shoes before you put them on to be sure there are no objects inside.  Do not cross your legs. This may decrease the blood flow to your feet.  If you find a minor scrape,  cut, or break in the skin on your feet, keep it and the skin around it clean and dry. These areas may be cleansed with mild soap and water. Do not cleanse the area with peroxide, alcohol, or iodine.  When you remove an adhesive bandage, be sure not to damage the skin around it.  If you have a wound, look at it several times a day to make sure it is healing.  Do not use heating pads or hot water bottles. They may burn your skin. If you have lost feeling in your feet or legs, you may not know it is happening until it is too late.  Make sure your health care provider performs a complete foot exam at least annually or more often if you have foot problems. Report any cuts, sores, or bruises to your health care provider immediately. SEEK MEDICAL CARE IF:   You have an injury that is not healing.  You have cuts or breaks in the skin.  You have an ingrown nail.  You notice redness on your legs or feet.  You feel burning or tingling in your legs or feet.  You have pain or cramps in your legs and feet.  Your legs or feet are numb.  Your feet always feel cold. SEEK IMMEDIATE MEDICAL CARE IF:   There is increasing redness,   swelling, or pain in or around a wound.  There is a red line that goes up your leg.  Pus is coming from a wound.  You develop a fever or as directed by your health care provider.  You notice a bad smell coming from an ulcer or wound.   This information is not intended to replace advice given to you by your health care provider. Make sure you discuss any questions you have with your health care provider.   Document Released: 04/10/2000 Document Revised: 12/14/2012 Document Reviewed: 09/20/2012 Elsevier Interactive Patient Education 2016 Elsevier Inc.  

## 2015-07-10 NOTE — Progress Notes (Signed)
Patient ID: Kathleen [name], female   DOB: 11-10-1950, 65 y.o.   MRN: [id number]  Subjective: This patient presents again for schedule visit complaining of painful toenails and walking wearing shoes and is requesting nail debridement  Objective: Orientated 3 Open skin lesions bilaterally The toenails are hypertrophic, elongated, incurvated, discolored and tender direct palpation 6-10  Assessment: Diabetic Symptomatic onychomycoses 6-10  Plan: Debrided toenails 10 mechanically and electrically without any bleeding  Reappoint 3 months

## 2015-07-24 ENCOUNTER — Other Ambulatory Visit: Payer: Self-pay | Admitting: *Deleted

## 2015-07-24 NOTE — Patient Outreach (Signed)
[facility]) Care Management  [place]  07/24/2015   Kathleen [name] December 02, 1950 [id number]  Subjective: RN Health Coach telephone call to patient.  Hipaa compliance verified. Per patient she is still [facility] to [facility]. She is monitoring her blood sugar and documenting. Per patient at her last check up her blood pressure was high and then it was low when the checked it the next time. RN explained that she would send patient some educational material on hypertension. Patient has be keeping her Dr visits. Patient is taking her medications as per order. Patient glucose is staying around the 100- 150 range. Patient has had her A1C done and it is slightly elevated from last time.Patient has agreed to follow up outreach calls.  Objective:   Current Medications:  Current Outpatient Prescriptions  Medication Sig Dispense Refill  . ACCU-CHEK FASTCLIX LANCETS MISC for testing blood sugar 3 times a day 306 each 1  . amitriptyline (ELAVIL) 50 MG tablet Take 0.5 tablets (25 mg total) by mouth at bedtime. Reported on 04/17/2015 30 tablet 2  . aspirin 81 MG tablet Take 81 mg by mouth daily.    . Blood Glucose Monitoring Suppl (ACCU-CHEK NANO SMARTVIEW) W/DEVICE KIT Check blood sugar 3 times daily 1 kit 0  . bumetanide (BUMEX) 1 MG tablet Take 1 tablet (1 mg total) by mouth 2 (two) times daily. 120 tablet 3  . dicyclomine (BENTYL) 20 MG tablet TAKE 1 TABLET (20 MG TOTAL) BY MOUTH 3 (THREE) TIMES DAILY AS NEEDED FOR SPASMS. 90 tablet 1  . gabapentin (NEURONTIN) 300 MG capsule TAKE 1 CAPSULE THREE TIMES DAILY 270 capsule 2  . glucose blood (ACCU-CHEK SMARTVIEW) test strip TEST BLOOD SUGAR THREE TIMES DAILY 300 each 1  . insulin detemir (LEVEMIR) 100 UNIT/ML injection Inject 0.82 mLs (82 Units total) into the skin at bedtime. 30 mL 5  . Insulin Syringe-Needle U-100 31G X 15/64" 1 ML MISC Use to inject insulin one time a day 100 each 5  . losartan (COZAAR) 50 MG tablet Take 1 tablet (50 mg  total) by mouth daily. 90 tablet 3  . metFORMIN (GLUCOPHAGE) 1000 MG tablet TAKE 1 TABLET (1,000 MG TOTAL) BY MOUTH 2 (TWO) TIMES DAILY WITH A MEAL. 180 tablet 3  . Multiple Vitamins-Minerals (HEALTHY EYES PO) Take by mouth daily. Reported on 05/17/2015    . NON FORMULARY Place 2 L into the nose daily. Wears with exertion and qhs    . omeprazole (PRILOSEC) 20 MG capsule TAKE 1 CAPSULE (20 MG TOTAL) BY MOUTH DAILY. 90 capsule 1   No current facility-administered medications for this visit.    Functional Status:  In your present state of health, do you have any difficulty performing the following activities: 07/24/2015 07/02/2015  Hearing? N N  Vision? N N  Difficulty concentrating or making decisions? N N  Walking or climbing stairs? Y Y  Dressing or bathing? - N  Doing errands, shopping? N N  Preparing Food and eating ? - -  Using the Toilet? - -  In the past six months, have you accidently leaked urine? - -  Do you have problems with loss of bowel control? - -  Managing your Medications? - -  Managing your Finances? - -  Housekeeping or managing your Housekeeping? - -    Fall/Depression Screening: PHQ 2/9 Scores 07/24/2015 07/02/2015 06/13/2015 05/17/2015 03/19/2015 02/27/2015 02/05/2015  PHQ - 2 Score 0 0 0 0 0 0 0   THN CM Care Plan  Problem One        Most Recent Value   Care Plan Problem One  Knowledge deficit in self management of diabetes   Role Documenting the Problem One  [place] for Problem One  Active   THN Long Term Goal (31-90 days)  Patient will have a decrease of HGB A1C by one point in 90 days   THN Long Term Goal Start Date  07/24/15 [not met patient has increased from 6.8 to 7.1]   Interventions for Problem One [place] discussed with patient what the  A1C is.  RN discussed how A1C is affected by the blood sugars.  RN will send EMMI information on  Why get A1C checked. RN sent educational material on Know your goal numbers. RN sent  educational material on How the A1C helps. RN Health Coach will follow up within 30 days for  discussion and teach back.   THN CM Short Term Goal #1 Start Date  07/24/15 [ongoing education]   Interventions for Short Term Goal #1  RN discussed hypo and hyper glycemia reactions that  the patient is experiencing.  RN sent patient EMMI educational matierial on high and low blood sugar.RN sent educational material on treating high and low blood sugars.    THN CM Short Term Goal #2 (0-30 days)  Patient will be able to verbalize an action plan for high and low blood sugar within 30 days   THN CM Short Term Goal #2 Start Date  07/24/15 [continued]   Interventions for Short Term Goal #2  RN reviewed patient action plan  and discussed additional action that patient should be takening.  RN sent EMMI educational material on self care.  RN will follow up with teach back   THN CM Short Term Goal #3 (0-30 days)  Patient will be able to verbalize foods high and low in carbohydrates within 30 days   THN CM Short Term Goal #3 Start Date  07/24/15 [continue]   Interventions for Short Tern Goal #3  RN sent patient a picture chart on carbohydrates. RN sent EMMI information on counting carbohydrates. RN sent educational material on creating a healthy meal. RN will follow up within a month   THN CM Short Term Goal #4 (0-30 days)  Patient will maintain a routine exercise plan within the next 30 days   THN CM Short Term Goal #4 Start Date  07/24/15 [continue]   Interventions for Short Term Goal #4  [facility] has started [facility] to [facility]. RN will encourage patient to continue. RN will send patient EMMI education on Diabetes and Exercise.. RN  sent patient educational material on Adding Activity to your life. Sticking with it and overcoming roadbloocks       Assessment:  Patient will continue to benefit from [employer] telephonic outreach for education and support for diabetes self management  Plan:  RN sent educational  material on  Adding activity to your life Sticking with it and overcoming roadblocks RN sent educational material on Know your goals RN sent educational material on Creating a meal plan RN will follow up outreach within a month for discussion and teach back  [place] Management [phone]

## 2015-07-25 ENCOUNTER — Encounter: Payer: Self-pay | Admitting: *Deleted

## 2015-08-09 ENCOUNTER — Ambulatory Visit
Admission: RE | Admit: 2015-08-09 | Discharge: 2015-08-09 | Disposition: A | Discharge status: Home / Self Care | Payer: Commercial Managed Care - HMO | Source: Ambulatory Visit

## 2015-08-09 DIAGNOSIS — Z1231 Encounter for screening mammogram for malignant neoplasm of breast: Secondary | ICD-10-CM

## 2015-08-09 DIAGNOSIS — R6889 Other general symptoms and signs: Secondary | ICD-10-CM | POA: Diagnosis not present

## 2015-08-22 ENCOUNTER — Encounter: Payer: Self-pay | Admitting: *Deleted

## 2015-08-22 ENCOUNTER — Ambulatory Visit: Payer: Self-pay | Admitting: *Deleted

## 2015-08-22 ENCOUNTER — Other Ambulatory Visit: Payer: Self-pay | Admitting: *Deleted

## 2015-08-22 NOTE — Patient Outreach (Addendum)
[facility]) Care Management  [place]  08/22/2015   Kathleen [name] 18-Sep-1950 [id number]  Subjective: RN Health Coach telephone call to patient.  Hipaa compliance verified. Per patient her fasting  blood glucose is 120 today. Per patient she has increased her exercise program . She is now going to [employer]  5 days a week for 40 minutes. Patient is reporting trying to eat healthier by decreasing her carbohydrates and sodium. Patient is reporting feeling so much better. She stated her stamina is so much better. She can do more things and not tire out so easily. She is drinking plenty of fluid each day. She reports checking her feet daily. Patient is working to get her HGB A1C down within the 6 range by the next A1C check. Patient has reported her blood pressure has come down so much. Her blood pressure today is 115/60 pulse 59. Patient has agreed to follow up outreach call.  Objective: Patient is verbalizing excitement about the progress she is making and how she feels so much better.  Encounter Medications:  Outpatient Encounter Prescriptions as of 08/22/2015  Medication Sig  . ACCU-CHEK FASTCLIX LANCETS MISC for testing blood sugar 3 times a day  . amitriptyline (ELAVIL) 50 MG tablet Take 0.5 tablets (25 mg total) by mouth at bedtime. Reported on 04/17/2015  . aspirin 81 MG tablet Take 81 mg by mouth daily.  . Blood Glucose Monitoring Suppl (ACCU-CHEK NANO SMARTVIEW) W/DEVICE KIT Check blood sugar 3 times daily  . bumetanide (BUMEX) 1 MG tablet Take 1 tablet (1 mg total) by mouth 2 (two) times daily.  Marland Kitchen dicyclomine (BENTYL) 20 MG tablet TAKE 1 TABLET (20 MG TOTAL) BY MOUTH 3 (THREE) TIMES DAILY AS NEEDED FOR SPASMS.  Marland Kitchen gabapentin (NEURONTIN) 300 MG capsule TAKE 1 CAPSULE THREE TIMES DAILY  . glucose blood (ACCU-CHEK SMARTVIEW) test strip TEST BLOOD SUGAR THREE TIMES DAILY  . insulin detemir (LEVEMIR) 100 UNIT/ML injection Inject 0.82 mLs (82 Units total) into the skin  at bedtime.  . Insulin Syringe-Needle U-100 31G X 15/64" 1 ML MISC Use to inject insulin one time a day  . losartan (COZAAR) 50 MG tablet Take 1 tablet (50 mg total) by mouth daily.  . metFORMIN (GLUCOPHAGE) 1000 MG tablet TAKE 1 TABLET (1,000 MG TOTAL) BY MOUTH 2 (TWO) TIMES DAILY WITH A MEAL.  . Multiple Vitamins-Minerals (HEALTHY EYES PO) Take by mouth daily. Reported on 05/17/2015  . NON FORMULARY Place 2 L into the nose daily. Wears with exertion and qhs  . omeprazole (PRILOSEC) 20 MG capsule TAKE 1 CAPSULE (20 MG TOTAL) BY MOUTH DAILY.   No facility-administered encounter medications on file as of 08/22/2015.    Functional Status:  In your present state of health, do you have any difficulty performing the following activities: 08/22/2015 07/24/2015  Hearing? N N  Vision? N N  Difficulty concentrating or making decisions? N N  Walking or climbing stairs? Y Y  Dressing or bathing? N -  Doing errands, shopping? N N  Preparing Food and eating ? N -  Using the Toilet? N -  In the past six months, have you accidently leaked urine? N -  Do you have problems with loss of bowel control? N -  Managing your Medications? N -  Managing your Finances? N -  Housekeeping or managing your Housekeeping? N -    Fall/Depression Screening: PHQ 2/9 Scores 08/22/2015 07/24/2015 07/02/2015 06/13/2015 05/17/2015 03/19/2015 02/27/2015  PHQ - 2 Score 0 0  0 0 0 0 0   THN CM Care Plan Problem One        Most Recent Value   Care Plan Problem One  Knowledge deficit in self management of diabetes   Role Documenting the Problem One  [place] for Problem One  Active   THN Long Term Goal (31-90 days)  Patient will have a decrease of HGB A1C by one point in 90 days   THN Long Term Goal Start Date  08/22/15 [this goal has not been met but patient is continuing to strive to meet this goal for the next A1C check]   Interventions for Problem One [place] discussed with patient what  the  A1C is.  RN discussed how A1C is affected by the blood sugars.  RN sent EMMI information on  Why get A1C checked. RN sent educational material on Know your goal numbers. RN sent educational material on How the A1C helps. RN Health Coach will follow up within 30 days for  discussion and teach back.   THN CM Short Term Goal #1 (0-30 days)  Patient will be able to verbalize five symptoms of hypo and hyper glycemia within 30 days   THN CM Short Term Goal #1 Start Date  08/22/15 [continue to go over symptoms and check if any have occurred each follow up]   Interventions for Short Term Goal #1  RN discussed hypo and hyper glycemia reactions that  the patient is experiencing.  RN sent patient EMMI educational matierial on high and low blood sugar.RN sent educational material on treating high and low blood sugars. RN follows up with each outreach to see if patient has experienced any symptoms   THN CM Short Term Goal #2 (0-30 days)  Patient will be able to verbalize an action plan for high and low blood sugar within 30 days   THN CM Short Term Goal #2 Start Date  08/22/15   Interventions for Short Term Goal #2  RN reviewed patient action plan  and discussed additional action that patient should be takening.  RN sent EMMI educational material on self care.  RN  follows up with teach back   THN CM Short Term Goal #3 (0-30 days)  Patient will be able to verbalize foods high and low in carbohydrates within 30 days   THN CM Short Term Goal #3 Start Date  08/22/15 [ongoing. Patient is trying to make sure she is eating right. RN Health Coach discusses the foods she is eating and should avoid]   Interventions for Short Tern Goal #3  RN sent patient a picture chart on carbohydrates. RN sent EMMI information on counting carbohydrates. RN sent educational material on creating a healthy meal. RN will follow up within a month. RN sent patient a list of healthy snacks   THN CM Short Term Goal #4 (0-30 days)  Patient will  maintain a routine exercise plan within the next 30 days   THN CM Short Term Goal #4 Start Date  08/22/15 [continue . Patient needs encouragement and positive reinforcement to stick with the exercise program]   Interventions for Short Term Goal #4  Patient is going to [employer]. RN encourages patient to continue. RN sent patient EMMI education on Diabetes and Exercise.. RN  sent patient educational material on Adding Activity to your life. Sticking with it and overcoming roadbloocks      Assessment:  Patient will continue to  benefit from  Health Coach telephonic outreach for education and support for diabetes self management.   Plan:  RN will send patient educational material on low sodium diet RN will send patient educational material on  Dash cook RN will send patient educational material on  Low salt cooking RN will send patient educational material on maintaining your overall health RN will follow up within a month for teach back and discussion  [facility] [phone]

## 2015-08-26 ENCOUNTER — Telehealth: Payer: Self-pay | Admitting: Internal Medicine

## 2015-08-26 NOTE — Telephone Encounter (Signed)
APPT. REMINDER CALL, LMTCB °

## 2015-08-27 ENCOUNTER — Ambulatory Visit (HOSPITAL_COMMUNITY)
Admission: RE | Admit: 2015-08-27 | Discharge: 2015-08-27 | Disposition: A | Discharge status: Home / Self Care | Payer: Commercial Managed Care - HMO | Source: Ambulatory Visit | Attending: Internal Medicine | Admitting: Internal Medicine

## 2015-08-27 ENCOUNTER — Ambulatory Visit (INDEPENDENT_AMBULATORY_CARE_PROVIDER_SITE_OTHER): Payer: Commercial Managed Care - HMO | Admitting: Internal Medicine

## 2015-08-27 ENCOUNTER — Encounter: Payer: Self-pay | Admitting: Internal Medicine

## 2015-08-27 VITALS — BP 109/39 | HR 54 | Temp 97.8°F | Ht 60.0 in | Wt 271.1 lb

## 2015-08-27 DIAGNOSIS — R9431 Abnormal electrocardiogram [ECG] [EKG]: Secondary | ICD-10-CM | POA: Diagnosis not present

## 2015-08-27 DIAGNOSIS — I441 Atrioventricular block, second degree: Secondary | ICD-10-CM | POA: Diagnosis not present

## 2015-08-27 DIAGNOSIS — E119 Type 2 diabetes mellitus without complications: Secondary | ICD-10-CM | POA: Diagnosis not present

## 2015-08-27 DIAGNOSIS — J9610 Chronic respiratory failure, unspecified whether with hypoxia or hypercapnia: Secondary | ICD-10-CM

## 2015-08-27 DIAGNOSIS — Z79899 Other long term (current) drug therapy: Secondary | ICD-10-CM

## 2015-08-27 DIAGNOSIS — I110 Hypertensive heart disease with heart failure: Secondary | ICD-10-CM | POA: Diagnosis not present

## 2015-08-27 DIAGNOSIS — Z9981 Dependence on supplemental oxygen: Secondary | ICD-10-CM

## 2015-08-27 DIAGNOSIS — I5032 Chronic diastolic (congestive) heart failure: Secondary | ICD-10-CM | POA: Diagnosis not present

## 2015-08-27 DIAGNOSIS — I5033 Acute on chronic diastolic (congestive) heart failure: Secondary | ICD-10-CM

## 2015-08-27 DIAGNOSIS — N179 Acute kidney failure, unspecified: Secondary | ICD-10-CM | POA: Diagnosis not present

## 2015-08-27 DIAGNOSIS — E1169 Type 2 diabetes mellitus with other specified complication: Secondary | ICD-10-CM

## 2015-08-27 DIAGNOSIS — I10 Essential (primary) hypertension: Secondary | ICD-10-CM

## 2015-08-27 DIAGNOSIS — J9611 Chronic respiratory failure with hypoxia: Secondary | ICD-10-CM

## 2015-08-27 DIAGNOSIS — R6889 Other general symptoms and signs: Secondary | ICD-10-CM | POA: Diagnosis not present

## 2015-08-27 LAB — GLUCOSE, CAPILLARY: GLUCOSE-CAPILLARY: 94 mg/dL (ref 65–99)

## 2015-08-27 NOTE — Assessment & Plan Note (Signed)
-   At this point likely CKD and her baseline seems to be around 1.4-1.6 - Will f/u BMP today - c/w bumex and losartan at current doses

## 2015-08-27 NOTE — Assessment & Plan Note (Signed)
-   Uncertain etiology of hypoxia - Patient still with O2 desaturation to 87% with ambulation but remains asymptomatic - Will c/w O2 at night and with exercise - Counseled on diet and weight loss

## 2015-08-27 NOTE — Assessment & Plan Note (Signed)
BP Readings from Last 3 Encounters:  08/27/15 109/39  07/02/15 161/102  04/17/15 120/76    Lab Results  Component Value Date   NA 146* 07/02/2015   K 4.3 07/02/2015   CREATININE 1.56* 07/02/2015    Assessment: Blood pressure control:  well controlled Progress toward BP goal:   at goal Comments: patient is compliant with medications  Plan: Medications:  continue current medications, patient is on losartan, bumex Educational resources provided: brochure Self management tools provided:   Other plans: will check BMP

## 2015-08-27 NOTE — Assessment & Plan Note (Signed)
-   now resolved. Patient is well controlled on current regimen - Will c/w bumex and cozaar - Patient counseled to follow fluid and salt restriction

## 2015-08-27 NOTE — Assessment & Plan Note (Signed)
Lab Results  Component Value Date   HGBA1C 7.3 07/02/2015   HGBA1C 6.9* 01/11/2015   HGBA1C 8.7 10/18/2014     Assessment: Diabetes control:  well controlled Progress toward A1C goal:   near goal Comments: compliant with meds. Noted to have well controlled blood sugars. Did have 3 low blood sugars to the 60s but 2 on the same day.    Plan: Medications:  continue current medications Home glucose monitoring: Frequency:   Timing:   Instruction/counseling given: reminded to bring blood glucose meter & log to each visit and discussed the need for weight loss Educational resources provided: brochure Self management tools provided: copy of home glucose meter download, home glucose logbook Other plans: Patient educated on signs and symptoms of hypoglycemia

## 2015-08-27 NOTE — Assessment & Plan Note (Signed)
-   Repeat EKG with persistent Mobitz 1 second degree heart block - Will refer to cardiology for follow up - No immediate intervention required at this time - Patient denies palpitations, no lightheadedness, no syncope, no chest pain

## 2015-08-27 NOTE — Patient Instructions (Addendum)
-   It was a pleasure seeing you today - Please continue with your diet and exercise - Will not change any of your medications today - Will check your kidney function and an EKG - Please follow up in 3 months

## 2015-08-27 NOTE — Progress Notes (Signed)
   Subjective:    Patient ID: Kathleen [name], female    DOB: [birth year], 65 y.o.   MRN: [id number]  HPI Patient seen and examined. She is here for routine follow up of her HTN and DM. She states she feels well with no new complaints. She continues to exercise and follow a diet but has noted an increased weight compared to her last visit. RN Regino Schultze ambulated patiebts and noted O2 sats of 96% on RA at rest but desatted to 87% with ambulation. She denies SOB/DOE.  She also states that her CHF is well controlled on her current regimen and notes no leg swelling or cough.   Review of Systems  Constitutional: Negative.   HENT: Negative.   Respiratory: Negative.  Negative for cough, chest tightness, shortness of breath and wheezing.   Cardiovascular: Negative.  Negative for chest pain and leg swelling.  Gastrointestinal: Negative.   Musculoskeletal: Negative.   Skin: Negative.   Neurological: Negative.   Psychiatric/Behavioral: Negative.        Objective:   Physical Exam  Constitutional: She is oriented to person, place, and time. She appears well-developed and well-nourished.  HENT:  Head: Normocephalic and atraumatic.  Cardiovascular: Normal rate, regular rhythm and normal heart sounds.   Pulmonary/Chest: Effort normal and breath sounds normal. No respiratory distress. She has no wheezes.  Abdominal: Soft. Bowel sounds are normal. She exhibits no distension. There is no tenderness.  Musculoskeletal: Normal range of motion. She exhibits no edema or tenderness.  Neurological: She is alert and oriented to person, place, and time.  Skin: Skin is warm. No rash noted. No erythema.  Psychiatric: She has a normal mood and affect. Her behavior is normal.          Assessment & Plan:  Please see problem based charting for assessment and plan:

## 2015-08-28 ENCOUNTER — Telehealth: Payer: Self-pay | Admitting: Internal Medicine

## 2015-08-28 LAB — BMP8+ANION GAP
Anion Gap: 21 mmol/L — ABNORMAL HIGH (ref 10.0–18.0)
BUN / CREAT RATIO: 31 — AB (ref 12–28)
BUN: 56 mg/dL — ABNORMAL HIGH (ref 8–27)
CO2: 21 mmol/L (ref 18–29)
CREATININE: 1.78 mg/dL — AB (ref 0.57–1.00)
Calcium: 9.4 mg/dL (ref 8.7–10.3)
Chloride: 100 mmol/L (ref 96–106)
GFR calc Af Amer: 35 mL/min/{1.73_m2} — ABNORMAL LOW (ref >59)
GFR calc non Af Amer: 30 mL/min/{1.73_m2} — ABNORMAL LOW (ref >59)
GLUCOSE: 101 mg/dL — AB (ref 65–99)
POTASSIUM: 5 mmol/L (ref 3.5–5.2)
SODIUM: 142 mmol/L (ref 134–144)

## 2015-08-28 NOTE — Telephone Encounter (Signed)
Results of BMP discussed with patient. Explained that she now likely has CKD and I would consider referral to nephrology on her next visit. She is in agreement with plan.

## 2015-09-10 ENCOUNTER — Ambulatory Visit: Payer: Commercial Managed Care - HMO | Admitting: Internal Medicine

## 2015-09-13 ENCOUNTER — Ambulatory Visit: Payer: Commercial Managed Care - HMO | Admitting: Interventional Cardiology

## 2015-09-19 ENCOUNTER — Other Ambulatory Visit: Payer: Self-pay | Admitting: *Deleted

## 2015-09-19 ENCOUNTER — Ambulatory Visit: Payer: Self-pay | Admitting: *Deleted

## 2015-09-19 NOTE — Patient Outreach (Signed)
Kathleen [name]) Care Management  [place]  09/19/2015   JANETTA VANDOREN 03-24-1951 [id number]  Subjective: RN Health Coach telephone call to patient.  Hipaa compliance verified. Patient has went to the Dr for her physical. She stated the Dr is very pleased with her progress. Patient A1C is 7.3 a little up from 6.9.  She is going to exercise classes 5x a week.  Per patient she is really enjoying it. She is taking her medications as prescribed. Her blood glucose today is 157. Patient is having some problems with choosing the right snacks. She has seen a podiatrist on 07/10/2015. She has been to the eye Dr and had eye surgery x 2 for cataracts. Per patient she is being referred to a cardiologist by her PCP. Per patient she didn't know why. Patient has agreed to follow up outreach.   Objective:   Encounter Medications:  Outpatient Encounter Prescriptions as of 09/19/2015  Medication Sig  . ACCU-CHEK FASTCLIX LANCETS MISC for testing blood sugar 3 times a day  . amitriptyline (ELAVIL) 50 MG tablet Take 0.5 tablets (25 mg total) by mouth at bedtime. Reported on 04/17/2015  . aspirin 81 MG tablet Take 81 mg by mouth daily.  . Blood Glucose Monitoring Suppl (ACCU-CHEK NANO SMARTVIEW) W/DEVICE KIT Check blood sugar 3 times daily  . bumetanide (BUMEX) 1 MG tablet Take 1 tablet (1 mg total) by mouth 2 (two) times daily.  Marland Kitchen dicyclomine (BENTYL) 20 MG tablet TAKE 1 TABLET (20 MG TOTAL) BY MOUTH 3 (THREE) TIMES DAILY AS NEEDED FOR SPASMS.  Marland Kitchen gabapentin (NEURONTIN) 300 MG capsule TAKE 1 CAPSULE THREE TIMES DAILY  . glucose blood (ACCU-CHEK SMARTVIEW) test strip TEST BLOOD SUGAR THREE TIMES DAILY  . insulin detemir (LEVEMIR) 100 UNIT/ML injection Inject 0.82 mLs (82 Units total) into the skin at bedtime.  . Insulin Syringe-Needle U-100 31G X 15/64" 1 ML MISC Use to inject insulin one time a day  . losartan (COZAAR) 50 MG tablet Take 1 tablet (50 mg total) by mouth daily.  . metFORMIN  (GLUCOPHAGE) 1000 MG tablet TAKE 1 TABLET (1,000 MG TOTAL) BY MOUTH 2 (TWO) TIMES DAILY WITH A MEAL.  . Multiple Vitamins-Minerals (HEALTHY EYES PO) Take by mouth daily. Reported on 05/17/2015  . NON FORMULARY Place 2 L into the nose daily. Wears with exertion and qhs  . omeprazole (PRILOSEC) 20 MG capsule TAKE 1 CAPSULE (20 MG TOTAL) BY MOUTH DAILY.   No facility-administered encounter medications on file as of 09/19/2015.    Functional Status:  In your present state of health, do you have any difficulty performing the following activities: 09/19/2015 08/27/2015  Hearing? N N  Vision? N N  Difficulty concentrating or making decisions? N N  Walking or climbing stairs? Y Y  Dressing or bathing? N N  Doing errands, shopping? N N  Preparing Food and eating ? - -  Using the Toilet? - -  In the past six months, have you accidently leaked urine? - -  Do you have problems with loss of bowel control? - -  Managing your Medications? - -  Managing your Finances? - -  Housekeeping or managing your Housekeeping? - -    Fall/Depression Screening: PHQ 2/9 Scores 09/19/2015 08/27/2015 08/22/2015 07/24/2015 07/02/2015 06/13/2015 05/17/2015  PHQ - 2 Score 0 0 0 0 0 0 0   THN CM Care Plan Problem One        Most Recent Value   Care Plan Problem One  Knowledge deficit in self management of diabetes   Role Documenting the Problem One  Kathleen for Problem One  Active   THN Long Term Goal (31-90 days)  Patient will have a decrease of HGB A1C by one point in 90 days   THN Long Term Goal Start Date  09/19/15 [not met. Patient A1C is 7.3]   Interventions for Problem One [place] discussed with patient what the  A1C is.  RN discussed how A1C is affected by the blood sugars.  RN sent EMMI information on  Why get A1C checked. RN sent educational material on Know your goal numbers. RN sent educational material on How the A1C helps.RN discussed possible reasons why patient A1C went  up. RN Health Coach will follow up within 30 days for  discussion and teach back.   THN CM Short Term Goal #1 Start Date  09/19/15 [continue]   THN CM Short Term Goal #2 Start Date  09/19/15 [ongoing]   Interventions for Short Term Goal #2  RN reviewed patient action plan  and discussed additional action that patient should be takening.  RN sent EMMI educational material on self care.  RN  follows up with teach back   THN CM Short Term Goal #3 (0-30 days)  Patient will be able to verbalize foods high and low in carbohydrates within 30 days   THN CM Short Term Goal #3 Start Date  09/19/15 [continue/Patient is having problems choosing healthy snacks]   Interventions for Short Tern Goal #3  RN sent patient a picture chart on carbohydrates. RN sent EMMI information on counting carbohydrates. RN sent educational material on creating a healthy meal. RN will follow up within a month. RN sent patient a list of healthy snacks. RN will  reiterate eating a snack at bedtime.   THN CM Short Term Goal #4 (0-30 days)  Patient will maintain a routine exercise plan within the next 30 days   THN CM Short Term Goal #4 Met Date  09/19/15 [This patient is enjoying her exercise program once she got started and has made significant progress]      Assessment:  Patient A1C is up from 6.9 to 7.3 Patient needs additional assistance with choosing healthy snacks Patient will continue to benefit from [place] telephonic outreach for education and support for diabetes self management.  Plan:  RN will send patient the list of Healthy Snacks again RN will send information on Why High Blood Glucose is a problem RN will Send patient additional information on Diabetes and Cardiovascular Risk RN will follow up with 30 days for teach back and discussion  [facility] [phone]

## 2015-09-25 ENCOUNTER — Encounter: Payer: Self-pay | Admitting: *Deleted

## 2015-10-03 ENCOUNTER — Encounter: Payer: Self-pay | Admitting: Cardiology

## 2015-10-07 ENCOUNTER — Encounter: Payer: Self-pay | Admitting: Cardiology

## 2015-10-07 ENCOUNTER — Ambulatory Visit (INDEPENDENT_AMBULATORY_CARE_PROVIDER_SITE_OTHER): Payer: Commercial Managed Care - HMO | Admitting: Cardiology

## 2015-10-07 VITALS — BP 118/58 | HR 49 | Ht 60.0 in | Wt 276.0 lb

## 2015-10-07 DIAGNOSIS — I10 Essential (primary) hypertension: Secondary | ICD-10-CM

## 2015-10-07 DIAGNOSIS — R6889 Other general symptoms and signs: Secondary | ICD-10-CM | POA: Diagnosis not present

## 2015-10-07 DIAGNOSIS — I441 Atrioventricular block, second degree: Secondary | ICD-10-CM

## 2015-10-07 DIAGNOSIS — I5032 Chronic diastolic (congestive) heart failure: Secondary | ICD-10-CM

## 2015-10-07 NOTE — Patient Instructions (Signed)
Medication Instructions:  Your physician recommends that you continue on your current medications as directed. Please refer to the Current Medication list given to you today.   Labwork: None  Testing/Procedures: Your physician has recommended that you wear an event monitor. Event monitors are medical devices that record the heart's electrical activity. Doctors most often [place] these monitors to diagnose arrhythmias. Arrhythmias are problems with the speed or rhythm of the heartbeat. The monitor is a small, portable device. You can wear one while you do your normal daily activities. This is usually used to diagnose what is causing palpitations/syncope (passing out).  Follow-Up: Your physician wants you to follow-up in: 6 months with Dr. Radford Pax. You will receive a reminder letter in the mail two months in advance. If you don't receive a letter, please call our office to schedule the follow-up appointment.   Any Other Special Instructions Will Be Listed Below (If Applicable).     If you need a refill on your cardiac medications before your next appointment, please call your pharmacy.

## 2015-10-07 NOTE — Progress Notes (Signed)
Cardiology Office Note    Date:  10/07/2015   ID:  Kathleen [name], DOB 1951/01/04, MRN [id number]  PCP:  Aldine Contes, MD  Cardiologist:  Fransico Him, MD   No chief complaint on file.   History of Present Illness:  Kathleen [name] is a 65 y.o. female morbidly obese WF with a history of HTN, morbid obesity, type 2 DM, IBS, dyslipidemia, Obesity hypoventilation syndrome (on home O2 with no OSA by PSG but showed nocturnal hypoxemia), chronic abdominal pain , chronic diastolic CHF, Wenkebach AV block.  She was recently seen by her PCP and was noted again to have Type I second degree AV block and is referred backf for evaluation.   She denies any chest pain,  LE edema, palpitations, or syncope.  She has chronic DOE mainly with exertion and has to wear her O2.  She denies any fatigue but does get dizzy with changes in position.     Past Medical History  Diagnosis Date  . Irritable bowel syndrome   . Fecal occult blood test positive   . Degenerative joint disease of hand   . Obesity   . Post-menopausal bleeding   . Dyslipidemia   . Diabetes mellitus   . Inadequate material resources   . Postmenopausal   . Hypertension   . Chronic diastolic CHF (congestive heart failure) ([place])   . Shortness of breath dyspnea     multifactorial from obesity, deconditioning, obesity hypoventilation syndrome  . GERD (gastroesophageal reflux disease)   . Headache   . Chronic diastolic (congestive) heart failure ([place]) 01/10/2015    Past Surgical History  Procedure Laterality Date  . Cholecystectomy    . Other surgical history      right shoulder tendon repair 05/2011  . Eye surgery      left lens implant s/p cataracts    Current Medications: Outpatient Prescriptions Prior to Visit  Medication Sig Dispense Refill  . ACCU-CHEK FASTCLIX LANCETS MISC for testing blood sugar 3 times a day 306 each 1  . amitriptyline (ELAVIL) 50 MG tablet Take 0.5 tablets (25 mg total) by mouth at bedtime.  Reported on 04/17/2015 30 tablet 2  . aspirin 81 MG tablet Take 81 mg by mouth daily.    . Blood Glucose Monitoring Suppl (ACCU-CHEK NANO SMARTVIEW) W/DEVICE KIT Check blood sugar 3 times daily 1 kit 0  . bumetanide (BUMEX) 1 MG tablet Take 1 tablet (1 mg total) by mouth 2 (two) times daily. 120 tablet 3  . dicyclomine (BENTYL) 20 MG tablet TAKE 1 TABLET (20 MG TOTAL) BY MOUTH 3 (THREE) TIMES DAILY AS NEEDED FOR SPASMS. 90 tablet 1  . gabapentin (NEURONTIN) 300 MG capsule TAKE 1 CAPSULE THREE TIMES DAILY 270 capsule 2  . glucose blood (ACCU-CHEK SMARTVIEW) test strip TEST BLOOD SUGAR THREE TIMES DAILY 300 each 1  . insulin detemir (LEVEMIR) 100 UNIT/ML injection Inject 0.82 mLs (82 Units total) into the skin at bedtime. 30 mL 5  . Insulin Syringe-Needle U-100 31G X 15/64" 1 ML MISC Use to inject insulin one time a day 100 each 5  . losartan (COZAAR) 50 MG tablet Take 1 tablet (50 mg total) by mouth daily. 90 tablet 3  . metFORMIN (GLUCOPHAGE) 1000 MG tablet TAKE 1 TABLET (1,000 MG TOTAL) BY MOUTH 2 (TWO) TIMES DAILY WITH A MEAL. 180 tablet 3  . Multiple Vitamins-Minerals (HEALTHY EYES PO) Take by mouth daily. Reported on 05/17/2015    . NON FORMULARY Place 2 L into the  nose daily. Wears with exertion and qhs    . omeprazole (PRILOSEC) 20 MG capsule TAKE 1 CAPSULE (20 MG TOTAL) BY MOUTH DAILY. 90 capsule 1   No facility-administered medications prior to visit.     Allergies:   Doxycycline   Social History   Social History  . Marital Status: Divorced    Spouse Name: N/A  . Number of Children: 2  . Years of Education: N/A   Occupational History  . unemployed    Social History Main Topics  . Smoking status: Never Smoker   . Smokeless tobacco: Never Used  . Alcohol Use: No  . Drug Use: No  . Sexual Activity: Not Currently   Other Topics Concern  . None   Social History Narrative   Single.   Divorced x2.   Has 2 children, by two different fathers.   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 Currently unemployed. May apply for disability/SSI given her pain in her hands which has made interviewing for jobs difficult.   Never Smoked   Alcohol use-no   Drug use-no   Regular exercise-yes      Financial assistance approved for 100% discount at [facility] and has [facility] card per [employer]   02/18/2010              Family History:  The patient's family history includes Diabetes in her mother and sister; Obesity in her mother; Stroke in her father. There is no history of Colon cancer.   ROS:   Please see the history of present illness.    ROS All other systems reviewed and are negative.   PHYSICAL EXAM:   VS:  BP 118/58 mmHg  Pulse 49  Ht 5' (1.524 m)  Wt 276 lb (125.193 kg)  BMI 53.90 kg/m2   GEN: Well nourished, well developed, in no acute distress HEENT: normal Neck: no JVD, carotid bruits, or masses Cardiac: RRR; no murmurs, rubs, or gallops,no edema.  Intact distal pulses bilaterally.  Respiratory:  clear to auscultation bilaterally, normal work of breathing GI: soft, nontender, nondistended, + BS MS: no deformity or atrophy Skin: warm and dry, no rash Neuro:  Alert and Oriented x 3, Strength and sensation are intact Psych: euthymic mood, full affect  Wt Readings from Last 3 Encounters:  10/07/15 276 lb (125.193 kg)  08/27/15 271 lb 1.6 oz (122.97 kg)  07/02/15 265 lb 14.4 oz (120.611 kg)      Studies/Labs Reviewed:   EKG:  EKG is not  ordered today.   Recent Labs: 01/10/2015: ALT 9*; B Natriuretic Peptide 313.7*; Magnesium 2.0 01/11/2015: TSH 4.466 01/15/2015: Hemoglobin 10.1* 01/22/2015: Platelets 165 08/27/2015: BUN 56*; Creatinine, Ser 1.78*; Potassium 5.0; Sodium 142   Lipid Panel    Component Value Date/Time   CHOL 134 07/02/2015 1215   CHOL 141 06/07/2014 1027   TRIG 78 07/02/2015 1215   HDL 39* 07/02/2015 1215   HDL 40 06/07/2014 1027   CHOLHDL 3.4 07/02/2015 1215   CHOLHDL 3.5  06/07/2014 1027   VLDL 16 06/07/2014 1027   LDLCALC 79 07/02/2015 1215   LDLCALC 85 06/07/2014 1027    Additional studies/ records that were reviewed today include:  OV notes from PCP    ASSESSMENT:    1. Mobitz type 1 second degree atrioventricular block   2. Essential hypertension   3. Chronic diastolic (congestive) heart failure (HCC)      PLAN:  In  order of problems listed above:  1. Mobitz type I second degree AV block - she is asymptomatic with this and has had a diagnosis of this on prior hospitalization. She has some dizzy spells but they are related more to changes in position.  She is not on any AV nodal blocking agents.  I will get a 30 day heart monitor to assess for any higher AV block. 2. HTN - BP controlled on current medication.  Continue ARB. 3. Chronic diastolic CHF - she appears euvolemic on exam today.  Continue Bumex/ARB.    Medication Adjustments/Labs and Tests Ordered: Current medicines are reviewed at length with the patient today.  Concerns regarding medicines are outlined above.  Medication changes, Labs and Tests ordered today are listed in the Patient Instructions below.  There are no Patient Instructions on file for this visit.   Signed, Fransico Him, MD  10/07/2015 11:50 AM    [address], [place], [place]  [address] Phone: [phone]; Fax: [phone]

## 2015-10-14 ENCOUNTER — Ambulatory Visit (INDEPENDENT_AMBULATORY_CARE_PROVIDER_SITE_OTHER): Payer: Commercial Managed Care - HMO

## 2015-10-14 DIAGNOSIS — I441 Atrioventricular block, second degree: Secondary | ICD-10-CM | POA: Diagnosis not present

## 2015-10-14 DIAGNOSIS — R6889 Other general symptoms and signs: Secondary | ICD-10-CM | POA: Diagnosis not present

## 2015-10-16 ENCOUNTER — Ambulatory Visit: Payer: Commercial Managed Care - HMO | Admitting: Podiatry

## 2015-10-18 ENCOUNTER — Other Ambulatory Visit: Payer: Self-pay | Admitting: *Deleted

## 2015-10-18 NOTE — Patient Outreach (Signed)
[facility]) Care Management  [place]  10/21/2015   MINDIE RAWDON August 07, 1950 [id number]  Subjective: RN Health Coach telephone call to patient.  Hipaa compliance verified. Per patient her blood sugar is 162 today. She has been sick for the last 2 days with nausea. Patient stated she hasn't had much appetite. Her blood sugar yesterday was 109. Per patient she had been  orange juice.  Patient has only been able to go exercise for one day this week. Patient went to the cardiologist and has a heart monitor on at this time. Per patient she had been doing real good with her diet. RN discussed with patient that she had gained a little weight from  the last time to 276 lbs on Tuesday  appointment  But today weight is 274 pounds. Patient is concerned about weight. Patient is wanting to loose weight and plans to get back into the gym next week.  Patient has agreed to follow up outreach call.  Objective:   Encounter Medications:  Outpatient Encounter Prescriptions as of 10/18/2015  Medication Sig  . ACCU-CHEK FASTCLIX LANCETS MISC for testing blood sugar 3 times a day  . amitriptyline (ELAVIL) 50 MG tablet Take 0.5 tablets (25 mg total) by mouth at bedtime. Reported on 04/17/2015  . aspirin 81 MG tablet Take 81 mg by mouth daily.  . Blood Glucose Monitoring Suppl (ACCU-CHEK NANO SMARTVIEW) W/DEVICE KIT Check blood sugar 3 times daily  . bumetanide (BUMEX) 1 MG tablet Take 1 tablet (1 mg total) by mouth 2 (two) times daily.  Marland Kitchen dicyclomine (BENTYL) 20 MG tablet TAKE 1 TABLET (20 MG TOTAL) BY MOUTH 3 (THREE) TIMES DAILY AS NEEDED FOR SPASMS.  Marland Kitchen gabapentin (NEURONTIN) 300 MG capsule TAKE 1 CAPSULE THREE TIMES DAILY  . glucose blood (ACCU-CHEK SMARTVIEW) test strip TEST BLOOD SUGAR THREE TIMES DAILY  . insulin detemir (LEVEMIR) 100 UNIT/ML injection Inject 0.82 mLs (82 Units total) into the skin at bedtime.  . Insulin Syringe-Needle U-100 31G X 15/64" 1 ML MISC Use to inject  insulin one time a day  . losartan (COZAAR) 50 MG tablet Take 1 tablet (50 mg total) by mouth daily.  . metFORMIN (GLUCOPHAGE) 1000 MG tablet TAKE 1 TABLET (1,000 MG TOTAL) BY MOUTH 2 (TWO) TIMES DAILY WITH A MEAL.  . Multiple Vitamins-Minerals (HEALTHY EYES PO) Take by mouth daily. Reported on 05/17/2015  . NON FORMULARY Place 2 L into the nose daily. Wears with exertion and qhs  . omeprazole (PRILOSEC) 20 MG capsule TAKE 1 CAPSULE (20 MG TOTAL) BY MOUTH DAILY.   No facility-administered encounter medications on file as of 10/18/2015.    Functional Status:  In your present state of health, do you have any difficulty performing the following activities: 09/19/2015 08/27/2015  Hearing? N N  Vision? N N  Difficulty concentrating or making decisions? N N  Walking or climbing stairs? Y Y  Dressing or bathing? N N  Doing errands, shopping? N N  Preparing Food and eating ? - -  Using the Toilet? - -  In the past six months, have you accidently leaked urine? - -  Do you have problems with loss of bowel control? - -  Managing your Medications? - -  Managing your Finances? - -  Housekeeping or managing your Housekeeping? - -    Fall/Depression Screening: PHQ 2/9 Scores 10/18/2015 09/19/2015 08/27/2015 08/22/2015 07/24/2015 07/02/2015 06/13/2015  PHQ - 2 Score 0 0 0 0 0 0 0   THN  CM Care Plan Problem One        Most Recent Value   Care Plan Problem One  Knowledge deficit in self management of diabetes   Role Documenting the Problem One  [place] for Problem One  Active   THN Long Term Goal (31-90 days)  Patient will have a decrease of HGB A1C by one point in 90 days   THN Long Term Goal Start Date  -- [continue. Patient next testing is in July]   Interventions for Problem One [place] discussed with patient what the  A1C is.  RN discussed how A1C is affected by the blood sugars.  RN sent EMMI information on  Why get A1C checked. RN sent educational material on  Know your goal numbers. RN sent educational material on How the A1C helps.RN discussed possible reasons why patient A1C went up. RN Health Coach will follow up within 30 days for  discussion and teach back.   THN CM Short Term Goal #1 (0-30 days)  Patient will be able to verbalize five symptoms of hypo and hyper glycemia within 30 days   THN CM Short Term Goal #1 Start Date  10/18/15 [continue since patient is having a sick day. I want to make sure she is able to recognize the symptoms]   Interventions for Short Term Goal #1  RN discussed hypo and hyper glycemia reactions that  the patient is experiencing.  RN sent patient EMMI educational matierial on high and low blood sugar.RN sent educational material on treating high and low blood sugars. RN follows up with each outreach to see if patient has experienced any symptoms   THN CM Short Term Goal #2 (0-30 days)  Patient will be able to verbalize an action plan for high and low blood sugar within 30 days   THN CM Short Term Goal #2 Start Date  10/18/15 [continue. Patient is trying make sure she takes something into not experience hypo or hyperglycemic reaction while she is sick]   Interventions for Short Term Goal #2  RN reviewed patient action plan  and discussed additional action that patient should be takening. RN talked about additional fluids and food patient could take on her sick days also. RN sent EMMI educational material on self care.  RN  follows up with teach back   THN CM Short Term Goal #3 (0-30 days)  Patient will be able to verbalize foods high and low in carbohydrates within 30 days   THN CM Short Term Goal #3 Start Date  10/18/15 [continue]   Interventions for Short Tern Goal #3  RN sent patient a picture chart on carbohydrates. RN sent EMMI information on counting carbohydrates. RN sent educational material on creating a healthy meal. RN will follow up within a month. RN sent patient a list of healthy snacks. RN will  reiterate eating a  snack at bedtime. RN discuss with patient about looking at the snacks and not over eating since she has a little weight gain   THN CM Short Term Goal #4 (0-30 days)  Patient will maintain a routine exercise plan within the next 30 days     Assessment:  Patient will  continue to benefit from [place] telephonic outreach for education and support for diabetes self management.  Plan:  RN will send patient information on Plate portion control. RN sent tips for eating away from home RN will follow up with discussion and teach  back within a month.  [facility] [phone]

## 2015-10-22 ENCOUNTER — Encounter: Payer: Self-pay | Admitting: *Deleted

## 2015-11-08 ENCOUNTER — Other Ambulatory Visit: Payer: Self-pay | Admitting: Internal Medicine

## 2015-11-08 NOTE — Telephone Encounter (Signed)
Last appt 08/27/15.  Next appt 12/10/15.

## 2015-11-09 ENCOUNTER — Other Ambulatory Visit: Payer: Self-pay | Admitting: Internal Medicine

## 2015-11-13 ENCOUNTER — Ambulatory Visit (INDEPENDENT_AMBULATORY_CARE_PROVIDER_SITE_OTHER): Payer: Commercial Managed Care - HMO | Admitting: Podiatry

## 2015-11-13 ENCOUNTER — Encounter: Payer: Self-pay | Admitting: Podiatry

## 2015-11-13 DIAGNOSIS — B351 Tinea unguium: Secondary | ICD-10-CM

## 2015-11-13 DIAGNOSIS — M79676 Pain in unspecified toe(s): Secondary | ICD-10-CM

## 2015-11-13 DIAGNOSIS — R6889 Other general symptoms and signs: Secondary | ICD-10-CM | POA: Diagnosis not present

## 2015-11-13 NOTE — Patient Instructions (Signed)
Diabetes and Foot Care Diabetes may cause you to have problems because of poor blood supply (circulation) to your feet and legs. This may cause the skin on your feet to become thinner, break easier, and heal more slowly. Your skin may become dry, and the skin may peel and crack. You may also have nerve damage in your legs and feet causing decreased feeling in them. You may not notice minor injuries to your feet that could lead to infections or more serious problems. Taking care of your feet is one of the most important things you can do for yourself.  HOME CARE INSTRUCTIONS  Wear shoes at all times, even in the house. Do not go barefoot. Bare feet are easily injured.  Check your feet daily for blisters, cuts, and redness. If you cannot see the bottom of your feet, use a mirror or ask someone for help.  Wash your feet with warm water (do not use hot water) and mild soap. Then pat your feet and the areas between your toes until they are completely dry. Do not soak your feet as this can dry your skin.  Apply a moisturizing lotion or petroleum jelly (that does not contain alcohol and is unscented) to the skin on your feet and to dry, brittle toenails. Do not apply lotion between your toes.  Trim your toenails straight across. Do not dig under them or around the cuticle. File the edges of your nails with an emery board or nail file.  Do not cut corns or calluses or try to remove them with medicine.  Wear clean socks or stockings every day. Make sure they are not too tight. Do not wear knee-high stockings since they may decrease blood flow to your legs.  Wear shoes that fit properly and have enough cushioning. To break in new shoes, wear them for just a few hours a day. This prevents you from injuring your feet. Always look in your shoes before you put them on to be sure there are no objects inside.  Do not cross your legs. This may decrease the blood flow to your feet.  If you find a minor scrape,  cut, or break in the skin on your feet, keep it and the skin around it clean and dry. These areas may be cleansed with mild soap and water. Do not cleanse the area with peroxide, alcohol, or iodine.  When you remove an adhesive bandage, be sure not to damage the skin around it.  If you have a wound, look at it several times a day to make sure it is healing.  Do not use heating pads or hot water bottles. They may burn your skin. If you have lost feeling in your feet or legs, you may not know it is happening until it is too late.  Make sure your health care provider performs a complete foot exam at least annually or more often if you have foot problems. Report any cuts, sores, or bruises to your health care provider immediately. SEEK MEDICAL CARE IF:   You have an injury that is not healing.  You have cuts or breaks in the skin.  You have an ingrown nail.  You notice redness on your legs or feet.  You feel burning or tingling in your legs or feet.  You have pain or cramps in your legs and feet.  Your legs or feet are numb.  Your feet always feel cold. SEEK IMMEDIATE MEDICAL CARE IF:   There is increasing redness,   swelling, or pain in or around a wound.  There is a red line that goes up your leg.  Pus is coming from a wound.  You develop a fever or as directed by your health care provider.  You notice a bad smell coming from an ulcer or wound.   This information is not intended to replace advice given to you by your health care provider. Make sure you discuss any questions you have with your health care provider.   Document Released: 04/10/2000 Document Revised: 12/14/2012 Document Reviewed: 09/20/2012 Elsevier Interactive Patient Education 2016 Elsevier Inc.  

## 2015-11-14 NOTE — Progress Notes (Signed)
Patient ID: Kathleen [name], female   DOB: [birth year], 65 y.o.   MRN: [id number]  Subjective: This patient presents again for schedule visit complaining of painful toenails and walking wearing shoes and is requesting nail debridement  Objective: Orientated 3 Open skin lesions bilaterally The toenails are hypertrophic, elongated, incurvated, discolored and tender direct palpation 6-10  Assessment: Diabetic Symptomatic onychomycoses 6-10  Plan: Debrided toenails 10 mechanically and electrically without any bleeding  Reappoint 3 months

## 2015-11-15 ENCOUNTER — Other Ambulatory Visit: Payer: Self-pay | Admitting: *Deleted

## 2015-11-15 ENCOUNTER — Encounter: Payer: Self-pay | Admitting: *Deleted

## 2015-11-15 NOTE — Patient Outreach (Signed)
[facility]) Care Management  11/15/2015   Kathleen [name] May 24, 1950 [id number]  Subjective: RN Health Coach received  telephone call from  patient.  Hipaa compliance verified. Per patient her blood sugars have been running 171-203 lately in the morning. Patient has not had any episodes of hypo or hyperglycemic reactions.  RN discussed with patient about the snacks at [facility] she had been eating. RN talked with patient about eating  graham cracker and peanut butter.  Patient has been feeling  good.  Patient has been going to her exercise classes on a regular bases. RN discussed with patient about follow up health care. Patient has Dr appointment and exam for Aug. Patient wasn't aware that she needs to go to a dentist since she had false teeth. RN explained to patient that she wears false teeth and she needs to have her gums checked for disease or teeth need relined.Patient stated the teeth are rubbing some. Patient has agreed to follow up outreach calls.   Objective:   Current Medications:  Current Outpatient Prescriptions  Medication Sig Dispense Refill  . ACCU-CHEK FASTCLIX LANCETS MISC for testing blood sugar 3 times a day 306 each 1  . amitriptyline (ELAVIL) 50 MG tablet Take 0.5 tablets (25 mg total) by mouth at bedtime. Reported on 04/17/2015 30 tablet 2  . aspirin 81 MG tablet Take 81 mg by mouth daily.    . Blood Glucose Monitoring Suppl (ACCU-CHEK NANO SMARTVIEW) W/DEVICE KIT Check blood sugar 3 times daily 1 kit 0  . bumetanide (BUMEX) 1 MG tablet TAKE 1 TABLET TWICE DAILY 180 tablet 3  . dicyclomine (BENTYL) 20 MG tablet TAKE 1 TABLET (20 MG TOTAL) BY MOUTH 3 (THREE) TIMES DAILY AS NEEDED FOR SPASMS. 90 tablet 1  . gabapentin (NEURONTIN) 300 MG capsule TAKE 1 CAPSULE THREE TIMES DAILY 270 capsule 2  . glucose blood (ACCU-CHEK SMARTVIEW) test strip TEST BLOOD SUGAR THREE TIMES DAILY 300 each 1  . insulin detemir (LEVEMIR) 100 UNIT/ML injection Inject 0.82 mLs (82 Units  total) into the skin at bedtime. 30 mL 5  . Insulin Syringe-Needle U-100 31G X 15/64" 1 ML MISC Use to inject insulin one time a day 100 each 5  . losartan (COZAAR) 50 MG tablet Take 1 tablet (50 mg total) by mouth daily. 90 tablet 3  . metFORMIN (GLUCOPHAGE) 1000 MG tablet TAKE 1 TABLET (1,000 MG TOTAL) BY MOUTH 2 (TWO) TIMES DAILY WITH A MEAL. 180 tablet 3  . Multiple Vitamins-Minerals (HEALTHY EYES PO) Take by mouth daily. Reported on 05/17/2015    . NON FORMULARY Place 2 L into the nose daily. Wears with exertion and qhs    . omeprazole (PRILOSEC) 20 MG capsule TAKE 1 CAPSULE (20 MG TOTAL) BY MOUTH DAILY. 90 capsule 0   No current facility-administered medications for this visit.    Functional Status:  In your present state of health, do you have any difficulty performing the following activities: 11/15/2015 09/19/2015  Hearing? N N  Vision? N N  Difficulty concentrating or making decisions? N N  Walking or climbing stairs? - Y  Dressing or bathing? N N  Doing errands, shopping? N N  Preparing Food and eating ? N -  Using the Toilet? N -  In the past six months, have you accidently leaked urine? N -  Do you have problems with loss of bowel control? N -  Managing your Medications? N -  Managing your Finances? N -  Housekeeping or managing your  Housekeeping? N -    Fall/Depression Screening: PHQ 2/9 Scores 11/15/2015 10/18/2015 09/19/2015 08/27/2015 08/22/2015 07/24/2015 07/02/2015  PHQ - 2 Score 0 0 0 0 0 0 0   THN CM Care Plan Problem One        Most Recent Value   Care Plan Problem One  Knowledge deficit in self management of diabetes   Role Documenting the Problem One  [place] for Problem One  Active   THN Long Term Goal (31-90 days)  Patient will have a decrease of HGB A1C by one point in 90 days   THN Long Term Goal Start Date  11/15/15   Interventions for Problem One [place] discussed with patient what the  A1C is.  RN discussed how A1C is  affected by the blood sugars.  RN sent EMMI information on  Why get A1C checked. RN sent educational material on Know your goal numbers. RN sent educational material on How the A1C helps.RN discussed possible reasons why patient A1C went up. RN Health Coach will follow up within 30 days for  discussion and teach back.   THN CM Short Term Goal #1 (0-30 days)  Patient will be able to verbalize five symptoms of hypo and hyper glycemia within 30 days   THN CM Short Term Goal #1 Met Date  11/15/15   Interventions for Short Term Goal #1  RN discussed hypo and hyper glycemia reactions that  the patient is experiencing.  RN sent patient EMMI educational matierial on high and low blood sugar.RN sent educational material on treating high and low blood sugars. RN follows up with each outreach to see if patient has experienced any symptoms   THN CM Short Term Goal #3 (0-30 days)  Patient will be able to verbalize foods high and low in carbohydrates within 30 days   THN CM Short Term Goal #3 Start Date  11/15/15   Interventions for Short Tern Goal #3  RN sent patient a picture chart on carbohydrates. RN sent EMMI information on counting carbohydrates. RN sent educational material on creating a healthy meal. RN will follow up within a month. RN sent patient a list of healthy snacks. RN will  reiterate eating a snack at bedtime. RN discuss with patient about looking at the snacks and not over eating since she has a little weight gain   THN CM Short Term Goal #4 (0-30 days)  Patient will report her blood sugars are under 200 within the next 30 days   THN CM Short Term Goal #4 Start Date  11/15/15   Interventions for Short Term Goal #4  Patient will choose a healthier snack at [facility] to help her blood sugar decrease in am. Patient understand she can do exercise at hs to help blood sugars. Patient understands she can drink water to keep hydrated.   THN CM Short Term Goal #5 (0-30 days)  Patient will be able  to verbalize  what  she needs to do to Maintaining her overall health within 30 days   THN CM Short Term Goal #5 Start Date  11/15/15   Interventions for Short Term Goal #5  RN sent patient educational material on maintaining her health. RN will follow up with discussion and teach back       Assessment:   Patient will continue to benefit from [place] telephonic outreach for education and support for diabetes self management. Plan:  RN sent patient Living  well with Diabetes workbook RN discussed Maintaining your overall health Patient report that Blood sugars are under 200 at am fasting Check RN  Will follow up in August with discussion and teach back  [facility] [phone]

## 2015-11-15 NOTE — Patient Outreach (Signed)
[facility]) Care Management  11/15/2015  Kathleen [name] February 04, 1951 [id number]  RN Health Coach  attempted #1 Follow up outreach call to patient.  Patient was unavailable. HIPPA compliance voicemail message was left with return callback number.  Plan: RN will call patient again within 14 days.    [facility] [phone]

## 2015-11-26 ENCOUNTER — Ambulatory Visit: Payer: Self-pay | Admitting: *Deleted

## 2015-12-05 ENCOUNTER — Other Ambulatory Visit: Payer: Self-pay | Admitting: Internal Medicine

## 2015-12-10 ENCOUNTER — Encounter: Payer: Self-pay | Admitting: Internal Medicine

## 2015-12-10 ENCOUNTER — Ambulatory Visit (INDEPENDENT_AMBULATORY_CARE_PROVIDER_SITE_OTHER): Payer: Commercial Managed Care - HMO | Admitting: Internal Medicine

## 2015-12-10 VITALS — BP 127/99 | HR 60 | Temp 97.9°F | Ht 60.0 in | Wt 277.3 lb

## 2015-12-10 DIAGNOSIS — N189 Chronic kidney disease, unspecified: Secondary | ICD-10-CM | POA: Diagnosis not present

## 2015-12-10 DIAGNOSIS — I130 Hypertensive heart and chronic kidney disease with heart failure and stage 1 through stage 4 chronic kidney disease, or unspecified chronic kidney disease: Secondary | ICD-10-CM | POA: Diagnosis not present

## 2015-12-10 DIAGNOSIS — I5032 Chronic diastolic (congestive) heart failure: Secondary | ICD-10-CM

## 2015-12-10 DIAGNOSIS — N183 Chronic kidney disease, stage 3 unspecified: Secondary | ICD-10-CM

## 2015-12-10 DIAGNOSIS — I441 Atrioventricular block, second degree: Secondary | ICD-10-CM

## 2015-12-10 DIAGNOSIS — E119 Type 2 diabetes mellitus without complications: Secondary | ICD-10-CM | POA: Diagnosis not present

## 2015-12-10 DIAGNOSIS — R6889 Other general symptoms and signs: Secondary | ICD-10-CM | POA: Diagnosis not present

## 2015-12-10 DIAGNOSIS — Z79899 Other long term (current) drug therapy: Secondary | ICD-10-CM

## 2015-12-10 DIAGNOSIS — E1122 Type 2 diabetes mellitus with diabetic chronic kidney disease: Secondary | ICD-10-CM

## 2015-12-10 DIAGNOSIS — Z794 Long term (current) use of insulin: Secondary | ICD-10-CM | POA: Diagnosis not present

## 2015-12-10 DIAGNOSIS — I10 Essential (primary) hypertension: Secondary | ICD-10-CM

## 2015-12-10 LAB — POCT GLYCOSYLATED HEMOGLOBIN (HGB A1C): Hemoglobin A1C: 7.9

## 2015-12-10 LAB — GLUCOSE, CAPILLARY: GLUCOSE-CAPILLARY: 171 mg/dL — AB (ref 65–99)

## 2015-12-10 MED ORDER — INSULIN DETEMIR 100 UNIT/ML ~~LOC~~ SOLN: 87.0000 [IU] | Freq: Every day | SUBCUTANEOUS | 5 refills | Status: DC

## 2015-12-10 MED ORDER — AMITRIPTYLINE HCL 50 MG PO TABS: 25.0000 mg | ORAL_TABLET | Freq: Every day | ORAL | 2 refills | Status: DC

## 2015-12-10 MED ORDER — OMEPRAZOLE 20 MG PO CPDR: DELAYED_RELEASE_CAPSULE | ORAL | 1 refills | Status: DC

## 2015-12-10 NOTE — Assessment & Plan Note (Addendum)
Lab Results  Component Value Date   HGBA1C 7.9 12/10/2015   HGBA1C 7.3 07/02/2015   HGBA1C 6.9 (H) 01/11/2015     Assessment: Diabetes control:  fair Progress toward A1C goal:   deteriorated Comments: compliant with meds  Plan: Medications:  continue current medications Home glucose monitoring: Frequency:   Timing:   Instruction/counseling given: reminded to bring blood glucose meter & log to each visit and discussed diet Educational resources provided: brochure (denies) Self management tools provided:   Other plans: Will continue with current medications and attempt diet modification given history of hypoglycemia. Will check BMP today

## 2015-12-10 NOTE — Assessment & Plan Note (Signed)
BP Readings from Last 3 Encounters:  12/10/15 (!) 127/99  10/07/15 (!) 118/58  08/27/15 (!) 109/39    Lab Results  Component Value Date   NA 142 08/27/2015   K 5.0 08/27/2015   CREATININE 1.78 (H) 08/27/2015    Assessment: Blood pressure control:  well controlled Progress toward BP goal:   at goal Comments: compliant with meds  Plan: Medications:  continue current medications Educational resources provided:   Self management tools provided:   Other plans:

## 2015-12-10 NOTE — Progress Notes (Signed)
   Subjective:    Patient ID: Kathleen [name], female    DOB: 03/22/1951, 65 y.o.   MRN: [id number]  HPI  Patient here for routine follow up of her HTN and DM. She denies any new complaints. She feels well and is compliant with her meds. She does note that she has been eating more fruit this summer and has noted increased blood sugars to the 200s.    Review of Systems  Constitutional: Negative.   HENT: Negative.   Respiratory: Negative.   Cardiovascular: Negative.   Gastrointestinal: Negative.   Musculoskeletal: Negative.   Skin: Negative.   Neurological: Negative.   Psychiatric/Behavioral: Negative.        Objective:   Physical Exam  Constitutional: She is oriented to person, place, and time. She appears well-developed and well-nourished.  HENT:  Head: Normocephalic and atraumatic.  Cardiovascular: Normal rate, regular rhythm and normal heart sounds.   Pulmonary/Chest: Effort normal and breath sounds normal. No respiratory distress. She has no wheezes.  Abdominal: Soft. Bowel sounds are normal. She exhibits no distension. There is no tenderness.  Musculoskeletal: Normal range of motion. She exhibits no edema.  Neurological: She is alert and oriented to person, place, and time.  Skin: Skin is warm. No rash noted. No erythema.  Psychiatric: She has a normal mood and affect. Her behavior is normal.          Assessment & Plan:  Please see problem based charting for assessment and plan:

## 2015-12-10 NOTE — Patient Instructions (Addendum)
-   It was a pleasure seeing you today - We will attempt to call Dr. Radford Pax and follow up your heart monitor results - We will check your bloodwork today - Your A1C has increased. Will change your levemir to 87 units - Please follow up in 3 months - Keep up the great work!! :)

## 2015-12-10 NOTE — Assessment & Plan Note (Addendum)
-   Patient currently on bumex - No recurrent swelling - Will c/w fluid and salt restriction - Will check BMP today

## 2015-12-10 NOTE — Assessment & Plan Note (Signed)
-   Patient with baseline creatinine now of 1.4-1.7 - Will check repeat BMP today

## 2015-12-10 NOTE — Assessment & Plan Note (Signed)
-   patient followed up with cardiology - She had a 30 day event monitor placed by cardiology - Results of this have not been communicated to patient - Will attempt to obtain results from Dr. Radford Pax

## 2015-12-11 ENCOUNTER — Telehealth: Payer: Self-pay | Admitting: Internal Medicine

## 2015-12-11 LAB — BMP8+ANION GAP
ANION GAP: 24 mmol/L — AB (ref 10.0–18.0)
BUN/Creatinine Ratio: 37 — ABNORMAL HIGH (ref 12–28)
BUN: 63 mg/dL — ABNORMAL HIGH (ref 8–27)
CO2: 20 mmol/L (ref 18–29)
CREATININE: 1.71 mg/dL — AB (ref 0.57–1.00)
Calcium: 10.1 mg/dL (ref 8.7–10.3)
Chloride: 99 mmol/L (ref 96–106)
GFR, EST AFRICAN AMERICAN: 36 mL/min/{1.73_m2} — AB (ref >59)
GFR, EST NON AFRICAN AMERICAN: 31 mL/min/{1.73_m2} — AB (ref >59)
Glucose: 172 mg/dL — ABNORMAL HIGH (ref 65–99)
POTASSIUM: 5.9 mmol/L — AB (ref 3.5–5.2)
SODIUM: 143 mmol/L (ref 134–144)

## 2015-12-11 MED ORDER — SODIUM POLYSTYRENE SULFONATE 15 GM/60ML PO SUSP: 30.0000 g | Freq: Once | ORAL | 0 refills | Status: AC

## 2015-12-11 NOTE — Telephone Encounter (Signed)
Patient noted to have elevated potassium of 5.9. Called patient and discussed results with her. Will give kayexalate * 1 and have her follow up for repeat bloodwork later this week or early next week as well as an EKG. If persistently elevated she may need to be on standing kayexalate. Hyperkalemia likely secondary to CKD. Would also consider referring to nephrology for follow up of her CKD.

## 2015-12-12 ENCOUNTER — Telehealth: Payer: Self-pay | Admitting: Internal Medicine

## 2015-12-12 NOTE — Telephone Encounter (Signed)
APT. REMINDER CALL, LMTCB °

## 2015-12-13 ENCOUNTER — Ambulatory Visit (HOSPITAL_COMMUNITY)
Admission: RE | Admit: 2015-12-13 | Discharge: 2015-12-13 | Disposition: A | Discharge status: Home / Self Care | Payer: Commercial Managed Care - HMO | Source: Ambulatory Visit | Attending: Internal Medicine | Admitting: Internal Medicine

## 2015-12-13 ENCOUNTER — Ambulatory Visit (INDEPENDENT_AMBULATORY_CARE_PROVIDER_SITE_OTHER): Payer: Commercial Managed Care - HMO | Admitting: Internal Medicine

## 2015-12-13 ENCOUNTER — Encounter: Payer: Self-pay | Admitting: Internal Medicine

## 2015-12-13 VITALS — BP 116/45 | HR 61 | Temp 98.1°F | Ht 60.0 in | Wt 278.5 lb

## 2015-12-13 DIAGNOSIS — E875 Hyperkalemia: Secondary | ICD-10-CM | POA: Diagnosis not present

## 2015-12-13 DIAGNOSIS — R9431 Abnormal electrocardiogram [ECG] [EKG]: Secondary | ICD-10-CM | POA: Diagnosis not present

## 2015-12-13 LAB — BASIC METABOLIC PANEL
ANION GAP: 7 (ref 5–15)
BUN: 66 mg/dL — ABNORMAL HIGH (ref 6–20)
CO2: 29 mmol/L (ref 22–32)
Calcium: 9.2 mg/dL (ref 8.9–10.3)
Chloride: 101 mmol/L (ref 101–111)
Creatinine, Ser: 1.88 mg/dL — ABNORMAL HIGH (ref 0.44–1.00)
GFR, EST AFRICAN AMERICAN: 31 mL/min — AB (ref >60)
GFR, EST NON AFRICAN AMERICAN: 27 mL/min — AB (ref >60)
Glucose, Bld: 180 mg/dL — ABNORMAL HIGH (ref 65–99)
POTASSIUM: 4.9 mmol/L (ref 3.5–5.1)
SODIUM: 137 mmol/L (ref 135–145)

## 2015-12-13 NOTE — Progress Notes (Signed)
   CC: Follow-up for clinic blood work previously showing hyperkalemia  HPI:  [name] is a 65 y.o.female with past medical history as noted below presents to [facility] clinic for follow-up of lab work done on previous clinic visit on 8/15 . She was found to have hyperkalemia, potassium at 5.9 and was told to take Kayexalate x 1 and return to the clinic for an EKG and BMP.  Today she arrives and states she has not gone by her pharmacy to pick up Kayexalate.  She denies decreased urine production, she takes Bumex.  She also denies muscle weakness, paralysis, chest pain or SOB.   Past Medical History:  Diagnosis Date  . Chronic diastolic (congestive) heart failure ([place]) 01/10/2015  . Chronic diastolic CHF (congestive heart failure) ([place])   . Degenerative joint disease of hand   . Diabetes mellitus   . Dyslipidemia   . Fecal occult blood test positive   . GERD (gastroesophageal reflux disease)   . Headache   . Hypertension   . Inadequate material resources   . Irritable bowel syndrome   . Obesity   . Post-menopausal bleeding   . Postmenopausal   . Shortness of breath dyspnea    multifactorial from obesity, deconditioning, obesity hypoventilation syndrome    Review of Systems:  Review of Systems  Eyes: Negative for blurred vision.  Respiratory: Negative for shortness of breath.   Cardiovascular: Negative for chest pain and palpitations.  Gastrointestinal: Positive for nausea. Negative for vomiting.  Genitourinary: Negative for dysuria.  Neurological: Negative for dizziness, tingling, focal weakness and headaches.     Physical Exam:  Vitals:   12/13/15 0934  BP: (!) 116/45  Pulse: 61  Temp: 98.1 F (36.7 C)  TempSrc: Oral  SpO2: 91%  Weight: 278 lb 8 oz (126.3 kg)  Height: 5' (1.524 m)   Physical Exam  HENT:  Head: Normocephalic and atraumatic.  Cardiovascular: Normal rate and regular rhythm.   Pulmonary/Chest: Effort normal and breath sounds normal.  Abdominal:  Soft. There is no tenderness.  Musculoskeletal:  Very mild edema in left lower extremity +1 pitting edema No pitting edema in right lower extremity   Skin: Skin is warm and dry.    Assessment & Plan:   See encounters tab for problem based medical decision making.   Patient seen with Dr. Evette Doffing

## 2015-12-13 NOTE — Assessment & Plan Note (Addendum)
Assessment: Hyperkalemia  On previous visit 8/15 patient had a potassium of 5.9.  Today repeat in office showed 4.9.  Patient did not take the Kayexalate prescribed to her.  She states she feels at her normal state of health.  In office EKG did not show peaked T waves or shortened QT interval.  The decrease in potassium lab value is most likely due to a hemolyzed sample, although there is no note associated with the lab.  None the less the current potassium is still on the high end of normal and medication could be contributing.  Today blood pressure is 116/45 and Heart Rate 61.  Plan: - Discussed plan with the Attending and Losartan 50 mg will be stopped for 2 weeks and patient was instructed to return and recheck blood pressure and repeat BMP. - Placed future order for BMP STAT to reassess results in office for next visit - May consider restarting Losartan on next visit if potassium is normal  - Counseled on consuming low potassium foods

## 2015-12-13 NOTE — Patient Instructions (Addendum)
Kathleen [name]  Please stop taking your Losartan 50mg    Please make an appointment for 2 weeks to recheck blood pressure and for blood work   Thank you

## 2015-12-16 ENCOUNTER — Other Ambulatory Visit: Payer: Self-pay | Admitting: Internal Medicine

## 2015-12-16 DIAGNOSIS — E1169 Type 2 diabetes mellitus with other specified complication: Secondary | ICD-10-CM

## 2015-12-17 ENCOUNTER — Telehealth: Payer: Self-pay | Admitting: *Deleted

## 2015-12-17 NOTE — Progress Notes (Signed)
Internal Medicine Clinic Attending  I saw and evaluated the patient.  I personally confirmed the key portions of the history and exam documented by Dr. Hoffman and I reviewed pertinent patient test results.  The assessment, diagnosis, and plan were formulated together and I agree with the documentation in the resident's note.      

## 2015-12-20 ENCOUNTER — Encounter: Payer: Self-pay | Admitting: *Deleted

## 2015-12-20 ENCOUNTER — Other Ambulatory Visit: Payer: Self-pay | Admitting: *Deleted

## 2015-12-20 NOTE — Patient Outreach (Signed)
[name]) Care Management  12/20/2015   Kathleen [name] 10/07/1950 [id number]  Subjective: RN Health Coach telephone call to patient.  [employer] compliance verified. Patient blood sugar this morning is 106 fasting. Patient reported going to the Dr and her A1C is elevated to 7.9 from 7.3. Her blood sugar range has been as high as 216. Her potassium was elevated and the Dr put a hold on the Losartan for 2 weeks. Per patient her insulin was increased. RN discussed with patient about watching for hypo and hyperglycemia. Patient reported that she has had several episodes of hyperglycemia when the blood sugar went up in the past month. Patient stated her symptoms consisted of her getting real sleepy. Per patient she watched what she was eating and took in fluids. Patient has made a Podiatrist appointment and an eye appointment. Patient is still currently very active going to the [place]. Patient reported reading the Diabetes living well booklet provided by the Health Coach and really enjoying it. Patient has agreed to follow up outreach. RN Health Coach explained we will touch base every 2 months.   Objective:   Current Medications:  Current Outpatient Prescriptions  Medication Sig Dispense Refill  . ACCU-CHEK FASTCLIX LANCETS MISC TEST BLOOD SUGAR THREE TIMES DAILY 306 each 1  . amitriptyline (ELAVIL) 50 MG tablet Take 0.5 tablets (25 mg total) by mouth at bedtime. Reported on 04/17/2015 30 tablet 2  . aspirin 81 MG tablet Take 81 mg by mouth daily.    . Blood Glucose Monitoring Suppl (ACCU-CHEK NANO SMARTVIEW) W/DEVICE KIT Check blood sugar 3 times daily 1 kit 0  . bumetanide (BUMEX) 1 MG tablet TAKE 1 TABLET TWICE DAILY 180 tablet 3  . dicyclomine (BENTYL) 20 MG tablet TAKE 1 TABLET (20 MG TOTAL) BY MOUTH 3 (THREE) TIMES DAILY AS NEEDED FOR SPASMS. 90 tablet 1  . gabapentin (NEURONTIN) 300 MG capsule TAKE 1 CAPSULE THREE TIMES DAILY 270 capsule 2  . glucose blood (ACCU-CHEK SMARTVIEW)  test strip TEST BLOOD SUGAR THREE TIMES DAILY 300 each 1  . insulin detemir (LEVEMIR) 100 UNIT/ML injection Inject 0.87 mLs (87 Units total) into the skin at bedtime. 30 mL 5  . Insulin Syringe-Needle U-100 31G X 15/64" 1 ML MISC Use to inject insulin one time a day 100 each 5  . losartan (COZAAR) 50 MG tablet Take 1 tablet (50 mg total) by mouth daily. 90 tablet 3  . metFORMIN (GLUCOPHAGE) 1000 MG tablet TAKE 1 TABLET (1,000 MG TOTAL) BY MOUTH 2 (TWO) TIMES DAILY WITH A MEAL. 180 tablet 3  . Multiple Vitamins-Minerals (HEALTHY EYES PO) Take by mouth daily. Reported on 05/17/2015    . NON FORMULARY Place 2 L into the nose daily. Wears with exertion and qhs    . omeprazole (PRILOSEC) 20 MG capsule TAKE 1 CAPSULE (20 MG TOTAL) BY MOUTH DAILY. 90 capsule 1   No current facility-administered medications for this visit.     Functional Status:  In your present state of health, do you have any difficulty performing the following activities: 12/20/2015 12/13/2015  Hearing? N N  Vision? N N  Difficulty concentrating or making decisions? N N  Walking or climbing stairs? Y Y  Dressing or bathing? N N  Doing errands, shopping? N N  Preparing Food and eating ? N -  Using the Toilet? N -  In the past six months, have you accidently leaked urine? N -  Do you have problems with loss of bowel control? N -  Managing your Medications? N -  Managing your Finances? N -  Housekeeping or managing your Housekeeping? N -  Some recent data might be hidden    Fall/Depression Screening: PHQ 2/9 Scores 12/20/2015 12/13/2015 12/10/2015 11/15/2015 10/18/2015 09/19/2015 08/27/2015  PHQ - 2 Score 0 0 0 0 0 0 0   THN CM Care Plan Problem One   Flowsheet Row Most Recent Value  Care Plan Problem One  Knowledge deficit in self management of diabetes  Role Documenting the Problem One  [place] for Problem One  Active  THN Long Term Goal (31-90 days)  Patient will have a decrease of HGB A1C by one point in 90  days  THN Long Term Goal Start Date  12/20/15  Interventions for Problem One [place] discussed with patient what the  A1C is.  RN discussed how A1C is affected by the blood sugars.  RN sent EMMI information on  Why get A1C checked. RN sent educational material on Know your goal numbers. RN sent educational material on How the A1C helps.RN discussed possible reasons why patient A1C went up. RN Health Coach will follow up within 30 days for  discussion and teach back.  THN CM Short Term Goal #2 (0-30 days)  Patient will be able to verbalize an action plan for high and low blood sugar within 30 days  THN CM Short Term Goal #2 Start Date  12/20/15  Interventions for Short Term Goal #2  RN reviewed patient action plan  and discussed additional action that patient should be takening. RN talked about additional fluids and food patient could take on her sick days also. RN sent EMMI educational material on self care.  RN  follows up with teach back  THN CM Short Term Goal #3 (0-30 days)  Patient will be able to verbalize foods high and low in carbohydrates within 30 days  THN CM Short Term Goal #3 Start Date  12/20/15  Interventions for Short Tern Goal #3  RN sent patient a picture chart on carbohydrates. RN sent EMMI information on counting carbohydrates. RN sent educational material on creating a healthy meal. RN will follow up within a month. RN sent patient a list of healthy snacks. RN will  reiterate eating a snack at bedtime. RN discuss with patient about looking at the snacks and not over eating since she has a little weight gain  THN CM Short Term Goal #4 (0-30 days)  Patient will report her blood sugars are under 200 within the next 30 days  THN CM Short Term Goal #4 Start Date  12/20/15  Interventions for Short Term Goal #4  Patient will choose a healthier snack at [facility] to help her blood sugar decrease in am. Patient understand she can do exercise at hs to help blood sugars. Patient  understands she can drink water to keep hydrated.  THN CM Short Term Goal #5 (0-30 days)  Patient will be able  to verbalize  what she needs to do to Maintaining her overall health within 30 days  THN CM Short Term Goal #5 Start Date  12/20/15  Interventions for Short Term Goal #5  RN sent patient educational material on maintaining her health. RN will follow up with discussion and teach back       Assessment:  Patient A1C increased 7.9 Patient had problems with hyperkalemia Patient blood sugar range went as high a 216 in the last month   Plan:  RN sent patient educational materia on hyperkalemia RN discussed signs and symptoms and action plan of high and low blood sugar RN discussed the A1C RN will follow up within the month of October  Elliot Meldrum [place] [facility] Care Management [phone]

## 2015-12-25 ENCOUNTER — Other Ambulatory Visit: Payer: Self-pay | Admitting: Internal Medicine

## 2015-12-25 DIAGNOSIS — E1169 Type 2 diabetes mellitus with other specified complication: Secondary | ICD-10-CM

## 2015-12-27 ENCOUNTER — Ambulatory Visit (INDEPENDENT_AMBULATORY_CARE_PROVIDER_SITE_OTHER): Payer: Commercial Managed Care - HMO | Admitting: Internal Medicine

## 2015-12-27 DIAGNOSIS — E875 Hyperkalemia: Secondary | ICD-10-CM

## 2015-12-27 DIAGNOSIS — I129 Hypertensive chronic kidney disease with stage 1 through stage 4 chronic kidney disease, or unspecified chronic kidney disease: Secondary | ICD-10-CM

## 2015-12-27 DIAGNOSIS — N183 Chronic kidney disease, stage 3 unspecified: Secondary | ICD-10-CM

## 2015-12-27 DIAGNOSIS — I10 Essential (primary) hypertension: Secondary | ICD-10-CM

## 2015-12-27 LAB — BASIC METABOLIC PANEL
ANION GAP: 7 (ref 5–15)
BUN: 44 mg/dL — ABNORMAL HIGH (ref 6–20)
CALCIUM: 9.1 mg/dL (ref 8.9–10.3)
CO2: 29 mmol/L (ref 22–32)
CREATININE: 1.96 mg/dL — AB (ref 0.44–1.00)
Chloride: 104 mmol/L (ref 101–111)
GFR, EST AFRICAN AMERICAN: 30 mL/min — AB (ref >60)
GFR, EST NON AFRICAN AMERICAN: 26 mL/min — AB (ref >60)
GLUCOSE: 172 mg/dL — AB (ref 65–99)
Potassium: 4.1 mmol/L (ref 3.5–5.1)
Sodium: 140 mmol/L (ref 135–145)

## 2015-12-27 MED ORDER — AMLODIPINE BESYLATE 5 MG PO TABS: 5.0000 mg | ORAL_TABLET | Freq: Every day | ORAL | 11 refills | Status: DC

## 2015-12-27 MED ORDER — BUMETANIDE 1 MG PO TABS: 1.0000 mg | ORAL_TABLET | Freq: Every day | ORAL | 3 refills | Status: DC

## 2015-12-27 NOTE — Progress Notes (Signed)
.     CC: For F/U of her HTN and hyperkalemia.  HPI: [name] is a 65 y.o. female morbidly obese WF with a history of HTN, morbid obesity, type 2 DM, IBS, dyslipidemia, Obesity hypoventilation syndrome (on home O2 with no OSA by PSG but showed nocturnal hypoxemia), chronic abdominal pain , chronic diastolic CHF, Wenkebach AV block.   She was found to have hyperkalemia during her last two visits (5.9 and then 4.9). Her Losartan was stopped for two weeks. She denies any chest pain,SOB, palpitation.  She is participating in a exercise program with out any difficulty.She loves to eat fruits  Past Medical History:  Diagnosis Date  . Chronic diastolic (congestive) heart failure ([place]) 01/10/2015  . Chronic diastolic CHF (congestive heart failure) ([place])   . Degenerative joint disease of hand   . Diabetes mellitus   . Dyslipidemia   . Fecal occult blood test positive   . GERD (gastroesophageal reflux disease)   . Headache   . Hypertension   . Inadequate material resources   . Irritable bowel syndrome   . Obesity   . Post-menopausal bleeding   . Postmenopausal   . Shortness of breath dyspnea    multifactorial from obesity, deconditioning, obesity hypoventilation syndrome    Review of Systems:  A complete ROS was negative except as per HPI.    Physical Exam:  Vitals:   12/27/15 0941  BP: (!) 141/59  Pulse: 66  Temp: 98.6 F (37 C)  TempSrc: Oral  SpO2: 93%  Weight: 278 lb (126.1 kg)  Height: 5' (1.524 m)   General:Well built, obese lady , NAD Neck: Supple, no JVD or bruits Lungs; Clear bilaterally CV: RRR, no R/M/G Abdomen: Soft, obese, non tender, non distended, BS +ve Extremities: 1+ pedal edema, looks more like venous congestion.No cyanosis Pulses: 1+ bilaterally  Assessment & Plan:   See Encounters Tab for problem based charting.  Patient seen with Dr. Daryll Drown

## 2015-12-27 NOTE — Assessment & Plan Note (Signed)
She had border line BP today. 141/59 She was off the losartan for two wks.  Plan; Start amlodipine 5 mg Stop losartan due to her worsening renal function and her hyperkalemia resolved after stopping losartan.

## 2015-12-27 NOTE — Patient Instructions (Signed)
It was pleasure taking care of you today. We apologize for the wait time. Your Potassium levels are fine today but your kidney function are little worse then before, might be due to your dry volumes,That's why I decreased your Bumex to once a day. Sent a new prescription for Amlodipine 5 mg daily for your BP. Please take your medicine as advised. Follow up with [place] in 2 wks for BP check and your blood work up.

## 2015-12-27 NOTE — Assessment & Plan Note (Signed)
She had Bun of 44 and Creatinine of 1.96 today. Which were increased from her previous 66/1.88. She was on Bumex 1 mg BID.Her losartan was stopped 2 wks ago. It looks like she is bit more dry.  Plan: Decrease her Bumex to 1 mg once daily. Recheck BMP in 2 wks.  I have placed an order of stat BMP for next visit.

## 2015-12-27 NOTE — Assessment & Plan Note (Signed)
Her hyperkalemia is resolved. Her potassium today was 4.1 Might be due to losartan and her worsening renal functions. She was provided with informations regarding high and low potassium food.  Plan: Discontinue losartan Strat Amlodipine 5 mg  Decreased Bumex to once daily dose.

## 2015-12-31 NOTE — Progress Notes (Signed)
Internal Medicine Clinic Attending  I saw and evaluated the patient.  I personally confirmed the key portions of the history and exam documented by Dr. Reesa Chew and I reviewed pertinent patient test results.  The assessment, diagnosis, and plan were formulated together and I agree with the documentation in the resident's note.  Repeat visit in 2 weeks to further investigate renal function and BP after changes as noted in Dr. Latina Craver note.

## 2016-01-06 ENCOUNTER — Encounter (INDEPENDENT_AMBULATORY_CARE_PROVIDER_SITE_OTHER): Payer: Commercial Managed Care - HMO | Admitting: Ophthalmology

## 2016-01-06 DIAGNOSIS — H43813 Vitreous degeneration, bilateral: Secondary | ICD-10-CM | POA: Diagnosis not present

## 2016-01-06 DIAGNOSIS — R6889 Other general symptoms and signs: Secondary | ICD-10-CM | POA: Diagnosis not present

## 2016-01-06 DIAGNOSIS — E11311 Type 2 diabetes mellitus with unspecified diabetic retinopathy with macular edema: Secondary | ICD-10-CM | POA: Diagnosis not present

## 2016-01-06 DIAGNOSIS — I10 Essential (primary) hypertension: Secondary | ICD-10-CM | POA: Diagnosis not present

## 2016-01-06 DIAGNOSIS — E113313 Type 2 diabetes mellitus with moderate nonproliferative diabetic retinopathy with macular edema, bilateral: Secondary | ICD-10-CM | POA: Diagnosis not present

## 2016-01-06 DIAGNOSIS — H35033 Hypertensive retinopathy, bilateral: Secondary | ICD-10-CM | POA: Diagnosis not present

## 2016-01-06 DIAGNOSIS — H33302 Unspecified retinal break, left eye: Secondary | ICD-10-CM

## 2016-01-06 LAB — HM DIABETES EYE EXAM

## 2016-01-09 ENCOUNTER — Telehealth: Payer: Self-pay | Admitting: Internal Medicine

## 2016-01-09 NOTE — Telephone Encounter (Signed)
A. REMINDER CALL, LMTCB °

## 2016-01-10 ENCOUNTER — Ambulatory Visit (INDEPENDENT_AMBULATORY_CARE_PROVIDER_SITE_OTHER): Payer: Medicaid Other | Admitting: Internal Medicine

## 2016-01-10 VITALS — BP 129/72 | HR 46 | Temp 98.0°F | Ht 61.0 in | Wt 289.1 lb

## 2016-01-10 DIAGNOSIS — J9610 Chronic respiratory failure, unspecified whether with hypoxia or hypercapnia: Secondary | ICD-10-CM | POA: Diagnosis not present

## 2016-01-10 DIAGNOSIS — J9611 Chronic respiratory failure with hypoxia: Secondary | ICD-10-CM

## 2016-01-10 DIAGNOSIS — Z23 Encounter for immunization: Secondary | ICD-10-CM

## 2016-01-10 DIAGNOSIS — E875 Hyperkalemia: Secondary | ICD-10-CM

## 2016-01-10 DIAGNOSIS — Z79899 Other long term (current) drug therapy: Secondary | ICD-10-CM

## 2016-01-10 DIAGNOSIS — N183 Chronic kidney disease, stage 3 unspecified: Secondary | ICD-10-CM

## 2016-01-10 DIAGNOSIS — I5032 Chronic diastolic (congestive) heart failure: Secondary | ICD-10-CM | POA: Diagnosis not present

## 2016-01-10 DIAGNOSIS — I10 Essential (primary) hypertension: Secondary | ICD-10-CM

## 2016-01-10 DIAGNOSIS — I130 Hypertensive heart and chronic kidney disease with heart failure and stage 1 through stage 4 chronic kidney disease, or unspecified chronic kidney disease: Secondary | ICD-10-CM | POA: Diagnosis present

## 2016-01-10 MED ORDER — BUMETANIDE 1 MG PO TABS: 1.0000 mg | ORAL_TABLET | Freq: Two times a day (BID) | ORAL | 3 refills | Status: DC

## 2016-01-10 NOTE — Assessment & Plan Note (Addendum)
A: Her Bumex was decreased to once daily due to worsening renal function.  Since Sept 1, her weight is up 11 pounds and she feels that her leg edema has worsened and is more tense.  On exam, she has bilateral 2+ edema with evidence of venous stasis.  P: - repeat BMET today. - increase Bumex back to BID - continue fluid and salt restriction. - RTC 2 weeks for weight recheck, repeat labs.

## 2016-01-10 NOTE — Assessment & Plan Note (Signed)
BP Readings from Last 3 Encounters:  01/10/16 129/72  12/27/15 (!) 141/59  12/13/15 (!) 116/45   A: Her BP today is well-controlled with the addition of amlodipine and discontinuation of losartan.  P: - continue amlodipine 5mg  daily. - repeat BMET today.

## 2016-01-10 NOTE — Patient Instructions (Signed)
Thank you for coming to see me today. It was a pleasure. Today we talked about:   1) continue taking amlodipine.  Your blood pressure looks great.  2) we will fill out paperwork for your home oxygen.  3) start taking Bumex twice daily again  4) we will let you know about your kidney function test when the results come back.  Please follow-up with [place] in 2 weeks.  If you have any questions or concerns, please do not hesitate to call the office at [phone].  Take Care,   Jule Ser, DO

## 2016-01-10 NOTE — Addendum Note (Signed)
Addended by: Truddie Crumble on: 01/10/2016 12:33 PM   Modules accepted: Orders

## 2016-01-10 NOTE — Assessment & Plan Note (Signed)
A: Patient with chronic respiratory failure who needs paperwork completed for her home oxygen order.  P: - oxygen level documented by Ms. Sander Nephew, RN as 86% at room air while at rest so no further testing was done to measure oxygen saturation with exertion - paperwork completed and faxed to [place]. - copy given to patient.

## 2016-01-10 NOTE — Progress Notes (Addendum)
CC: here to follow up her BP and kidney function.  HPI:  Kathleen [name] is a 65 y.o. woman with a past medical history listed below here today for follow up of her HTN, chronic respiratory failure, and CKD.  For details of today's visit and the status of her chronic medical issues please refer to the assessment and plan.   Past Medical History:  Diagnosis Date  . Chronic diastolic (congestive) heart failure ([place]) 01/10/2015  . Chronic diastolic CHF (congestive heart failure) ([place])   . Degenerative joint disease of hand   . Diabetes mellitus   . Dyslipidemia   . Fecal occult blood test positive   . GERD (gastroesophageal reflux disease)   . Headache   . Hypertension   . Inadequate material resources   . Irritable bowel syndrome   . Obesity   . Post-menopausal bleeding   . Postmenopausal   . Shortness of breath dyspnea    multifactorial from obesity, deconditioning, obesity hypoventilation syndrome    Review of Systems:   Review of Systems  Constitutional: Negative for chills and fever.  Respiratory: Negative for cough.   Cardiovascular: Positive for leg swelling. Negative for chest pain.  Gastrointestinal: Negative for abdominal pain.    Physical Exam:  Vitals:   01/10/16 1111  BP: 129/72  Pulse: (!) 46  Temp: 98 F (36.7 C)  TempSrc: Oral  SpO2: 91%  Weight: 289 lb 1.6 oz (131.1 kg)  Height: 5\' 1"  (1.549 m)   Physical Exam  Constitutional: She is oriented to person, place, and time.  Very pleasant, obese woman, ambulates with cane.  HENT:  Head: Normocephalic and atraumatic.  Eyes: EOM are normal.  Cardiovascular:  Bradycardic rate, regular rhythm.  Pulmonary/Chest: Effort normal and breath sounds normal. She has no wheezes. She has no rales.  Musculoskeletal: She exhibits edema (2+ tense edema bilaterally with some evidence of venous stasis.).  Neurological: She is alert and oriented to person, place, and time.  Skin: Skin is warm and dry.    Psychiatric: Affect normal.     Assessment & Plan:   See Encounters Tab for problem based charting.  Patient seen with Dr. Dareen Piano   Hypertension BP Readings from Last 3 Encounters:  01/10/16 129/72  12/27/15 (!) 141/59  12/13/15 (!) 116/45   A: Her BP today is well-controlled with the addition of amlodipine and discontinuation of losartan.  P: - continue amlodipine 5mg  daily. - repeat BMET today.  Chronic diastolic (congestive) heart failure (H[facility]) A: Her Bumex was decreased to once daily due to worsening renal function.  Since Sept 1, her weight is up 11 pounds and she feels that her leg edema has worsened and is more tense.  On exam, she has bilateral 2+ edema with evidence of venous stasis.  P: - repeat BMET today. - increase Bumex back to BID - continue fluid and salt restriction. - RTC 2 weeks for weight recheck, repeat labs.  Chronic respiratory failure A: Patient with chronic respiratory failure who needs paperwork completed for her home oxygen order.  P: - oxygen level documented by Ms. Sander Nephew, RN as 86% at room air while at rest so no further testing was done to measure oxygen saturation with exertion - paperwork completed and faxed to [place]. - copy given to patient.  CKD (chronic kidney disease) stage 3, GFR 30-59 ml/min A: Creatinine of 1.96 and stable electrolytes on Sept 1.  Her Bumex was decreased to daily dosing from BID.  Her edema is worse today and her weight has gone up 11 pounds since then.  P: - we will increase Bumex back to BID and recheck her BMET. - she will return in 2 weeks for repeat labs. - I think her baseline likely around 1.4-1.7 at this point  ADDENDUM 8:47 AM 01/14/16: -Patients BMET resulted with potassium of 5.8.  I have called patient and left a voice message to let her know of abnormal potassium level.  I will also place a future lab order for her to come and repeat BMET.

## 2016-01-10 NOTE — Assessment & Plan Note (Addendum)
A: Creatinine of 1.96 and stable electrolytes on Sept 1.  Her Bumex was decreased to daily dosing from BID.  Her edema is worse today and her weight has gone up 11 pounds since then.  P: - we will increase Bumex back to BID and recheck her BMET. - she will return in 2 weeks for repeat labs. - I think her baseline likely around 1.4-1.7 at this point  ADDENDUM 8:47 AM 01/14/16: -Patients BMET resulted with potassium of 5.8.  I have called patient and left a voice message to let her know of abnormal potassium level.  I will also place a future lab order for her to come and repeat BMET.

## 2016-01-10 NOTE — Progress Notes (Addendum)
Internal Medicine Clinic Attending  I saw and evaluated the patient.  I personally confirmed the key portions of the history and exam documented by Dr. Wallace and I reviewed pertinent patient test results.  The assessment, diagnosis, and plan were formulated together and I agree with the documentation in the resident's note. 

## 2016-01-11 LAB — BMP8+ANION GAP
Anion Gap: 19 mmol/L — ABNORMAL HIGH (ref 10.0–18.0)
BUN / CREAT RATIO: 20 (ref 12–28)
BUN: 35 mg/dL — AB (ref 8–27)
CO2: 23 mmol/L (ref 18–29)
CREATININE: 1.72 mg/dL — AB (ref 0.57–1.00)
Calcium: 9.6 mg/dL (ref 8.7–10.3)
Chloride: 102 mmol/L (ref 96–106)
GFR calc non Af Amer: 31 mL/min/{1.73_m2} — ABNORMAL LOW (ref >59)
GFR, EST AFRICAN AMERICAN: 36 mL/min/{1.73_m2} — AB (ref >59)
Glucose: 146 mg/dL — ABNORMAL HIGH (ref 65–99)
Potassium: 5.8 mmol/L — ABNORMAL HIGH (ref 3.5–5.2)
SODIUM: 144 mmol/L (ref 134–144)

## 2016-01-15 ENCOUNTER — Ambulatory Visit: Payer: Medicaid Other

## 2016-01-15 ENCOUNTER — Other Ambulatory Visit: Payer: Self-pay | Admitting: Internal Medicine

## 2016-01-15 DIAGNOSIS — N183 Chronic kidney disease, stage 3 unspecified: Secondary | ICD-10-CM

## 2016-01-16 ENCOUNTER — Ambulatory Visit (INDEPENDENT_AMBULATORY_CARE_PROVIDER_SITE_OTHER): Payer: Commercial Managed Care - HMO | Admitting: Internal Medicine

## 2016-01-16 ENCOUNTER — Encounter: Payer: Self-pay | Admitting: Internal Medicine

## 2016-01-16 DIAGNOSIS — N183 Chronic kidney disease, stage 3 unspecified: Secondary | ICD-10-CM

## 2016-01-16 DIAGNOSIS — E875 Hyperkalemia: Secondary | ICD-10-CM

## 2016-01-16 DIAGNOSIS — R899 Unspecified abnormal finding in specimens from other organs, systems and tissues: Secondary | ICD-10-CM

## 2016-01-16 LAB — BASIC METABOLIC PANEL
ANION GAP: 9 (ref 5–15)
BUN: 32 mg/dL — AB (ref 6–20)
CHLORIDE: 101 mmol/L (ref 101–111)
CO2: 28 mmol/L (ref 22–32)
Calcium: 9 mg/dL (ref 8.9–10.3)
Creatinine, Ser: 1.64 mg/dL — ABNORMAL HIGH (ref 0.44–1.00)
GFR calc Af Amer: 37 mL/min — ABNORMAL LOW (ref >60)
GFR calc non Af Amer: 32 mL/min — ABNORMAL LOW (ref >60)
GLUCOSE: 143 mg/dL — AB (ref 65–99)
POTASSIUM: 4.5 mmol/L (ref 3.5–5.1)
SODIUM: 138 mmol/L (ref 135–145)

## 2016-01-16 NOTE — Progress Notes (Signed)
   CC: here for f/u hyperkalemia  HPI:  [name] is a 65 y.o. woman with a past medical history listed below here today for follow up of her hyperkalemia.  On routine labs last week, her K was found to be 5.8.  No history of hyperkalemia.  Otherwise, she reports feeling well and thinks her lower extremity edema has improved with adding back the BID dosing of her Bumex.  For details of today's visit and the status of her chronic medical issues please refer to the assessment and plan.   Past Medical History:  Diagnosis Date  . Chronic diastolic (congestive) heart failure ([place]) 01/10/2015  . Chronic diastolic CHF (congestive heart failure) ([place])   . Degenerative joint disease of hand   . Diabetes mellitus   . Dyslipidemia   . Fecal occult blood test positive   . GERD (gastroesophageal reflux disease)   . Headache   . Hypertension   . Inadequate material resources   . Irritable bowel syndrome   . Obesity   . Post-menopausal bleeding   . Postmenopausal   . Shortness of breath dyspnea    multifactorial from obesity, deconditioning, obesity hypoventilation syndrome    Review of Systems:   Review of Systems  Constitutional: Negative.   Cardiovascular: Positive for leg swelling (better with BID Bumex).  Musculoskeletal: Negative for myalgias.     Physical Exam:  Vitals:   01/16/16 1401  BP: (!) 123/52  Pulse: (!) 50  Temp: 97.8 F (36.6 C)  TempSrc: Oral  SpO2: 99%  Weight: 286 lb (129.7 kg)   Physical Exam  Constitutional: She is oriented to person, place, and time and well-developed, well-nourished, and in no distress.  HENT:  Head: Normocephalic and atraumatic.  Pulmonary/Chest: Effort normal and breath sounds normal. She has no wheezes. She has no rales.  Neurological: She is alert and oriented to person, place, and time.  Psychiatric: Mood and affect normal.    Assessment & Plan:   See Encounters Tab for problem based charting.  Patient seen with Dr.  Dareen Piano    Abnormal laboratory test result A: Patient with abnormal routine labs with potassium of 5.8.  Asymptomatic.  Repeat BMET today shows a potassium of 4.5, normal sodium, improved serum creatinine and stable CKD III.   P: - she will follow up with [place] in clinic again next week to reassess her diuretics and volume status. - we will repeat a BMET at that time as well. - her weight is down 3 pounds from last week.

## 2016-01-16 NOTE — Assessment & Plan Note (Signed)
A: Patient with abnormal routine labs with potassium of 5.8.  Asymptomatic.  Repeat BMET today shows a potassium of 4.5, normal sodium, improved serum creatinine and stable CKD III.   P: - she will follow up with [place] in clinic again next week to reassess her diuretics and volume status. - we will repeat a BMET at that time as well. - her weight is down 3 pounds from last week.

## 2016-01-21 ENCOUNTER — Encounter: Payer: Self-pay | Admitting: *Deleted

## 2016-01-22 NOTE — Progress Notes (Signed)
Internal Medicine Clinic Attending  Case discussed with Dr. Wallace at the time of the visit.  We reviewed the resident's history and exam and pertinent patient test results.  I agree with the assessment, diagnosis, and plan of care documented in the resident's note.  

## 2016-01-23 ENCOUNTER — Telehealth: Payer: Self-pay | Admitting: Internal Medicine

## 2016-01-23 NOTE — Telephone Encounter (Signed)
APT. REMINDER CALL, LMTCB °

## 2016-01-24 ENCOUNTER — Encounter: Payer: Self-pay | Admitting: Internal Medicine

## 2016-01-24 ENCOUNTER — Ambulatory Visit (INDEPENDENT_AMBULATORY_CARE_PROVIDER_SITE_OTHER): Payer: Commercial Managed Care - HMO | Admitting: Internal Medicine

## 2016-01-24 VITALS — BP 132/69 | HR 50 | Temp 97.7°F | Ht 60.0 in | Wt 289.1 lb

## 2016-01-24 DIAGNOSIS — E875 Hyperkalemia: Secondary | ICD-10-CM | POA: Diagnosis not present

## 2016-01-24 DIAGNOSIS — I110 Hypertensive heart disease with heart failure: Secondary | ICD-10-CM

## 2016-01-24 DIAGNOSIS — I5032 Chronic diastolic (congestive) heart failure: Secondary | ICD-10-CM

## 2016-01-24 DIAGNOSIS — I10 Essential (primary) hypertension: Secondary | ICD-10-CM

## 2016-01-24 LAB — BASIC METABOLIC PANEL
ANION GAP: 7 (ref 5–15)
BUN: 38 mg/dL — AB (ref 6–20)
CO2: 29 mmol/L (ref 22–32)
Calcium: 9.1 mg/dL (ref 8.9–10.3)
Chloride: 105 mmol/L (ref 101–111)
Creatinine, Ser: 1.76 mg/dL — ABNORMAL HIGH (ref 0.44–1.00)
GFR calc Af Amer: 34 mL/min — ABNORMAL LOW (ref >60)
GFR, EST NON AFRICAN AMERICAN: 29 mL/min — AB (ref >60)
Glucose, Bld: 87 mg/dL (ref 65–99)
POTASSIUM: 3.7 mmol/L (ref 3.5–5.1)
SODIUM: 141 mmol/L (ref 135–145)

## 2016-01-24 NOTE — Progress Notes (Signed)
CC: here for f/u of her heart failure and diuresis  HPI:  [name] is a 65 y.o. woman with a past medical history listed below here today for follow up of her CHF and diuresis.  For details of today's visit and the status of her chronic medical issues please refer to the assessment and plan.   Past Medical History:  Diagnosis Date  . Chronic diastolic (congestive) heart failure ([place]) 01/10/2015  . Chronic diastolic CHF (congestive heart failure) ([place])   . Degenerative joint disease of hand   . Diabetes mellitus   . Dyslipidemia   . Fecal occult blood test positive   . GERD (gastroesophageal reflux disease)   . Headache   . Hypertension   . Inadequate material resources   . Irritable bowel syndrome   . Obesity   . Post-menopausal bleeding   . Postmenopausal   . Shortness of breath dyspnea    multifactorial from obesity, deconditioning, obesity hypoventilation syndrome    Review of Systems:   Please see pertinent ROS reviewed in HPI and problem based charting.   Physical Exam:  Vitals:   01/24/16 1019  BP: 132/69  Pulse: (!) 50  Temp: 97.7 F (36.5 C)  TempSrc: Oral  SpO2: 91%  Weight: 289 lb 1.6 oz (131.1 kg)  Height: 5' (1.524 m)   Physical Exam  Constitutional: She is oriented to person, place, and time and well-developed, well-nourished, and in no distress.  Cardiovascular: Normal rate and regular rhythm.   Pulmonary/Chest: Effort normal and breath sounds normal. She has no wheezes. She has no rales.  Musculoskeletal: She exhibits edema.  Mild 1+ lower extremity edema bilaterally.  She also has some chronic venous congestion.  Neurological: She is alert and oriented to person, place, and time.  Skin: Skin is warm and dry.     Assessment & Plan:   See Encounters Tab for problem based charting.  Patient discussed with Dr. Lynnae January.  Chronic diastolic (congestive) heart failure (H[facility]) A: She is tolerating her increased Bumex of 1mg  BID well.  She  reports that despite her weight being the same today (289 lbs) as it was on 9/15 she feels like her leg swelling is better and they are not as tight.  She is not having worsening SOB or other symptoms to suggest volume overload.  In fact, she feels that her mouth has been more dry lately with the extra Bumex.  Her creatinine today is 1.7 which is stable for where it has been lately and her electrolytes have normalized.  She reports checking her weight daily and was 185lbs on her home scale this morning.  P: - we will continue with current diuresis management of Bumex 1mg  BID given how she is currently tolerating it - we will have her RTC in 2-3 weeks for close follow up and repeat BMET - recommended she bring [place] a copy of her daily weights next visit. - continue monitoring her fluid and salt intake.  Hyperkalemia A: This issue has resolved with the last two metabolic panels.  She continues to abstain from Losartan but she may benefit from returning to it in the future for microalbuminuria benefit.  She had an elevated microalbumin-creatinine ratio in Feb 2016 but otherwise has been < 30 previously.  Her potassium levels may be better able to tolerate the losartan as well now that she has increased her Bumex.  Additionally, the amlodipine that was started may only contribute to her leg swelling but it is  currently doing a good job controlling her BP.  P: - she will follow up in 2-3 weeks for follow up. - if electrolytes and creatinine are stable, may consider stopping amlodipine and returning to losartan for BP control and renal benefit.  Hypertension A/P: - continue current medications - see discussion under "Hyperkalemia"

## 2016-01-24 NOTE — Assessment & Plan Note (Signed)
A: She is tolerating her increased Bumex of 1mg  BID well.  She reports that despite her weight being the same today (289 lbs) as it was on 9/15 she feels like her leg swelling is better and they are not as tight.  She is not having worsening SOB or other symptoms to suggest volume overload.  In fact, she feels that her mouth has been more dry lately with the extra Bumex.  Her creatinine today is 1.7 which is stable for where it has been lately and her electrolytes have normalized.  She reports checking her weight daily and was 185lbs on her home scale this morning.  P: - we will continue with current diuresis management of Bumex 1mg  BID given how she is currently tolerating it - we will have her RTC in 2-3 weeks for close follow up and repeat BMET - recommended she bring [place] a copy of her daily weights next visit. - continue monitoring her fluid and salt intake.

## 2016-01-24 NOTE — Assessment & Plan Note (Signed)
A: This issue has resolved with the last two metabolic panels.  She continues to abstain from Losartan but she may benefit from returning to it in the future for microalbuminuria benefit.  She had an elevated microalbumin-creatinine ratio in Feb 2016 but otherwise has been < 30 previously.  Her potassium levels may be better able to tolerate the losartan as well now that she has increased her Bumex.  Additionally, the amlodipine that was started may only contribute to her leg swelling but it is currently doing a good job controlling her BP.  P: - she will follow up in 2-3 weeks for follow up. - if electrolytes and creatinine are stable, may consider stopping amlodipine and returning to losartan for BP control and renal benefit.

## 2016-01-24 NOTE — Assessment & Plan Note (Signed)
A/P: - continue current medications - see discussion under "Hyperkalemia"

## 2016-01-24 NOTE — Patient Instructions (Signed)
Thank you for coming to see me today. It was a pleasure. Today we talked about:   Leg swelling: - continue your current dose of Bumex -- we are not making any changes to your medications today. - weigh yourself at home on a daily basis and write the nubmers down.  Bring this to your follow up appointment - I will call you if your lab work today indicates we need to change anything.  Please follow-up with [place] in 2-3 weeks.  If you have any questions or concerns, please do not hesitate to call the office at [phone].  Take Care,   Jule Ser, DO

## 2016-01-27 NOTE — Progress Notes (Signed)
Internal Medicine Clinic Attending  Case discussed with Dr. Wallace at the time of the visit.  We reviewed the resident's history and exam and pertinent patient test results.  I agree with the assessment, diagnosis, and plan of care documented in the resident's note.  

## 2016-02-03 ENCOUNTER — Encounter (INDEPENDENT_AMBULATORY_CARE_PROVIDER_SITE_OTHER): Payer: Commercial Managed Care - HMO | Admitting: Ophthalmology

## 2016-02-03 DIAGNOSIS — I10 Essential (primary) hypertension: Secondary | ICD-10-CM | POA: Diagnosis not present

## 2016-02-03 DIAGNOSIS — E113393 Type 2 diabetes mellitus with moderate nonproliferative diabetic retinopathy without macular edema, bilateral: Secondary | ICD-10-CM

## 2016-02-03 DIAGNOSIS — E11311 Type 2 diabetes mellitus with unspecified diabetic retinopathy with macular edema: Secondary | ICD-10-CM

## 2016-02-03 DIAGNOSIS — H33302 Unspecified retinal break, left eye: Secondary | ICD-10-CM | POA: Diagnosis not present

## 2016-02-03 DIAGNOSIS — H35033 Hypertensive retinopathy, bilateral: Secondary | ICD-10-CM

## 2016-02-03 DIAGNOSIS — H43813 Vitreous degeneration, bilateral: Secondary | ICD-10-CM

## 2016-02-03 LAB — HM DIABETES EYE EXAM

## 2016-02-10 ENCOUNTER — Other Ambulatory Visit: Payer: Self-pay | Admitting: Internal Medicine

## 2016-02-12 ENCOUNTER — Ambulatory Visit: Payer: Commercial Managed Care - HMO | Admitting: Podiatry

## 2016-02-13 ENCOUNTER — Telehealth: Payer: Self-pay | Admitting: Internal Medicine

## 2016-02-13 NOTE — Telephone Encounter (Signed)
APT. REMINDER CALL, LMTCB °

## 2016-02-14 ENCOUNTER — Ambulatory Visit (INDEPENDENT_AMBULATORY_CARE_PROVIDER_SITE_OTHER): Payer: Commercial Managed Care - HMO | Admitting: Internal Medicine

## 2016-02-14 VITALS — BP 113/50 | HR 53 | Temp 98.3°F | Ht 60.0 in | Wt 295.9 lb

## 2016-02-14 DIAGNOSIS — R808 Other proteinuria: Secondary | ICD-10-CM

## 2016-02-14 DIAGNOSIS — I5033 Acute on chronic diastolic (congestive) heart failure: Secondary | ICD-10-CM | POA: Diagnosis not present

## 2016-02-14 DIAGNOSIS — I5032 Chronic diastolic (congestive) heart failure: Secondary | ICD-10-CM

## 2016-02-14 LAB — COMPREHENSIVE METABOLIC PANEL
ALT: 11 U/L — AB (ref 14–54)
AST: 15 U/L (ref 15–41)
Albumin: 3.6 g/dL (ref 3.5–5.0)
Alkaline Phosphatase: 102 U/L (ref 38–126)
Anion gap: 8 (ref 5–15)
BUN: 38 mg/dL — AB (ref 6–20)
CHLORIDE: 101 mmol/L (ref 101–111)
CO2: 31 mmol/L (ref 22–32)
CREATININE: 1.67 mg/dL — AB (ref 0.44–1.00)
Calcium: 9.2 mg/dL (ref 8.9–10.3)
GFR calc Af Amer: 36 mL/min — ABNORMAL LOW (ref >60)
GFR, EST NON AFRICAN AMERICAN: 31 mL/min — AB (ref >60)
GLUCOSE: 110 mg/dL — AB (ref 65–99)
Potassium: 4.4 mmol/L (ref 3.5–5.1)
SODIUM: 140 mmol/L (ref 135–145)
Total Bilirubin: 0.9 mg/dL (ref 0.3–1.2)
Total Protein: 7.2 g/dL (ref 6.5–8.1)

## 2016-02-14 MED ORDER — LOSARTAN POTASSIUM 50 MG PO TABS: 50.0000 mg | ORAL_TABLET | Freq: Every day | ORAL | 1 refills | Status: DC

## 2016-02-14 NOTE — Progress Notes (Signed)
   CC: Patient is complaining of bilateral lower extremity edema.  HPI:  [name] is a 65 y.o. female with a past medical history of conditions listed below presenting to the clinic complaining of bilateral lower extremity edema. Please see problem based charting for the status of the patient's current and chronic medical conditions.   Past Medical History:  Diagnosis Date  . Chronic diastolic (congestive) heart failure 01/10/2015  . Chronic diastolic CHF (congestive heart failure) ([place])   . Degenerative joint disease of hand   . Diabetes mellitus   . Dyslipidemia   . Fecal occult blood test positive   . GERD (gastroesophageal reflux disease)   . Headache   . Hypertension   . Inadequate material resources   . Irritable bowel syndrome   . Obesity   . Post-menopausal bleeding   . Postmenopausal   . Shortness of breath dyspnea    multifactorial from obesity, deconditioning, obesity hypoventilation syndrome    Review of Systems:  Pertinent positives mentioned in HPI. Remainder of all ROS negative.   Physical Exam:  Vitals:   02/14/16 1015  BP: (!) 113/50  Pulse: (!) 53  Temp: 98.3 F (36.8 C)  TempSrc: Oral  SpO2: (!) 83%  Weight: 295 lb 14.4 oz (134.2 kg)  Height: 5' (1.524 m)   Physical Exam  Constitutional: She is oriented to person, place, and time. She appears well-developed and well-nourished. No distress.  HENT:  Head: Normocephalic and atraumatic.  Mouth/Throat: No oropharyngeal exudate.  Eyes: EOM are normal. Right eye exhibits no discharge. Left eye exhibits no discharge.  Neck: Neck supple. No tracheal deviation present.  Cardiovascular: Normal rate, regular rhythm and intact distal pulses.   Pulmonary/Chest: Effort normal and breath sounds normal. She has no wheezes. She has no rales.  On home oxygen via nasal cannula  Abdominal: Soft. Bowel sounds are normal. She exhibits no distension. There is no tenderness.  Musculoskeletal: Normal range of  motion.  +3 pitting edema of bilateral lower extremities up to knee level.  Neurological: She is alert and oriented to person, place, and time.  Skin: Skin is warm and dry.    Assessment & Plan:   See Encounters Tab for problem based charting.  Patient discussed with Dr. Angelia Mould

## 2016-02-14 NOTE — Patient Instructions (Addendum)
[name] it was nice meeting you today.  -Take Bumetanide 1 mg three times daily for the next 1 week. Then, go back to your usual dose of Bumetanide 1 mg twice daily.  -STOP taking Amlodipine  -Start wearing compression stockings  -Reduce your dietary salt intake. Avoid eating from canned tuna.  -Return for a follow-up visit in 1 week.     Low-Sodium Eating Plan Sodium raises blood pressure and causes water to be held in the body. Getting less sodium from food will help lower your blood pressure, reduce any swelling, and protect your heart, liver, and kidneys. We get sodium by adding salt (sodium chloride) to food. Most of our sodium comes from canned, boxed, and frozen foods. Restaurant foods, fast foods, and pizza are also very high in sodium. Even if you take medicine to lower your blood pressure or to reduce fluid in your body, getting less sodium from your food is important. WHAT IS MY PLAN? Most people should limit their sodium intake to 2,300 mg a day. Your health care provider recommends that you limit your sodium intake to __________ a day.  WHAT DO I NEED TO KNOW ABOUT THIS EATING PLAN? For the low-sodium eating plan, you will follow these general guidelines:  Choose foods with a % Daily Value for sodium of less than 5% (as listed on the food label).   Use salt-free seasonings or herbs instead of table salt or sea salt.   Check with your health care provider or pharmacist before using salt substitutes.   Eat fresh foods.  Eat more vegetables and fruits.  Limit canned vegetables. If you do use them, rinse them well to decrease the sodium.   Limit cheese to 1 oz (28 g) per day.   Eat lower-sodium products, often labeled as "lower sodium" or "no salt added."  Avoid foods that contain monosodium glutamate (MSG). MSG is sometimes added to [place] food and some canned foods.  Check food labels (Nutrition Facts labels) on foods to learn how much sodium is in one  serving.  Eat more home-cooked food and less restaurant, buffet, and fast food.  When eating at a restaurant, ask that your food be prepared with less salt, or no salt if possible.  HOW DO I READ FOOD LABELS FOR SODIUM INFORMATION? The Nutrition Facts label lists the amount of sodium in one serving of the food. If you eat more than one serving, you must multiply the listed amount of sodium by the number of servings. Food labels may also identify foods as:  Sodium free--Less than 5 mg in a serving.  Very low sodium--35 mg or less in a serving.  Low sodium--140 mg or less in a serving.  Light in sodium--50% less sodium in a serving. For example, if a food that usually has 300 mg of sodium is changed to become light in sodium, it will have 150 mg of sodium.  Reduced sodium--25% less sodium in a serving. For example, if a food that usually has 400 mg of sodium is changed to reduced sodium, it will have 300 mg of sodium. WHAT FOODS CAN I EAT? Grains Low-sodium cereals, including oats, puffed wheat and rice, and shredded wheat cereals. Low-sodium crackers. Unsalted rice and pasta. Lower-sodium bread.  Vegetables Frozen or fresh vegetables. Low-sodium or reduced-sodium canned vegetables. Low-sodium or reduced-sodium tomato sauce and paste. Low-sodium or reduced-sodium tomato and vegetable juices.  Fruits Fresh, frozen, and canned fruit. Fruit juice.  Meat and Other Protein Products Low-sodium canned  tuna and salmon. Fresh or frozen meat, poultry, seafood, and fish. Lamb. Unsalted nuts. Dried beans, peas, and lentils without added salt. Unsalted canned beans. Homemade soups without salt. Eggs.  Dairy Milk. Soy milk. Ricotta cheese. Low-sodium or reduced-sodium cheeses. Yogurt.  Condiments Fresh and dried herbs and spices. Salt-free seasonings. Onion and garlic powders. Low-sodium varieties of mustard and ketchup. Fresh or refrigerated horseradish. Lemon juice.  Fats and  Oils Reduced-sodium salad dressings. Unsalted butter.  Other Unsalted popcorn and pretzels.  The items listed above may not be a complete list of recommended foods or beverages. Contact your dietitian for more options. WHAT FOODS ARE NOT RECOMMENDED? Grains Instant hot cereals. Bread stuffing, pancake, and biscuit mixes. Croutons. Seasoned rice or pasta mixes. Noodle soup cups. Boxed or frozen macaroni and cheese. Self-rising flour. Regular salted crackers. Vegetables Regular canned vegetables. Regular canned tomato sauce and paste. Regular tomato and vegetable juices. Frozen vegetables in sauces. Salted [place] fries. Olives. Angie Fava. Relishes. Sauerkraut. Salsa. Meat and Other Protein Products Salted, canned, smoked, spiced, or pickled meats, seafood, or fish. Bacon, ham, sausage, hot dogs, corned beef, chipped beef, and packaged luncheon meats. Salt pork. Jerky. Pickled herring. Anchovies, regular canned tuna, and sardines. Salted nuts. Dairy Processed cheese and cheese spreads. Cheese curds. Blue cheese and cottage cheese. Buttermilk.  Condiments Onion and garlic salt, seasoned salt, table salt, and sea salt. Canned and packaged gravies. Worcestershire sauce. Tartar sauce. Barbecue sauce. Teriyaki sauce. Soy sauce, including reduced sodium. Steak sauce. Fish sauce. Oyster sauce. Cocktail sauce. Horseradish that you find on the shelf. Regular ketchup and mustard. Meat flavorings and tenderizers. Bouillon cubes. Hot sauce. Tabasco sauce. Marinades. Taco seasonings. Relishes. Fats and Oils Regular salad dressings. Salted butter. Margarine. Ghee. Bacon fat.  Other Potato and tortilla chips. Corn chips and puffs. Salted popcorn and pretzels. Canned or dried soups. Pizza. Frozen entrees and pot pies.  The items listed above may not be a complete list of foods and beverages to avoid. Contact your dietitian for more information.   This information is not intended to replace advice given  to you by your health care provider. Make sure you discuss any questions you have with your health care provider.   Document Released: 10/03/2001 Document Revised: 05/04/2014 Document Reviewed: 02/15/2013 Elsevier Interactive Patient Education [employer].

## 2016-02-14 NOTE — Addendum Note (Signed)
Addended by: Shela Leff on: 02/14/2016 01:39 PM   Modules accepted: Orders

## 2016-02-14 NOTE — Assessment & Plan Note (Addendum)
History of present illness Patient states she continues to have bilateral lower extremity edema which has been especially worse for the past 1 week. Reports having occasional nonproductive cough. Reports eating canned tuna, canned vegetables, and deli meats on a regular basis. Denies having any shortness of breath; continues to use 2 L of oxygen at night and with activity.  Assessment Patient has a history of diastolic congestive heart failure. She continues to have bilateral lower extremity edema and has gained 6 pounds in the past 3 weeks. Lungs clear on exam. Does endorse eating foods rich in sodium. She is currently taking bumetanide 1 mg twice daily. Also taking amlodipine 5 mg daily for hypertension. Urine microalbumin to creatinine ratio elevated at 28.1 in 05/2014. Albumin low at 3.1 in 12/2014. Her worsening bilateral lower extremity edema is likely multifactorial: increased dietary salt intake in the setting of heart failure, proteinuria, and hypo-albuminemia.  Plan -Patient has been advised to take bumetanide 1 mg 3 times a day for the next one week and then go back to her usual dose of bumetanide 1 mg twice daily -Stop amlodipine in the setting of proteinuria. Patient cannot be started on a non-dihydropyridine calcium channel blocker such as nifedipine or diltiazem because she is chronically bradycardic and these medications would have a negative inotropic effect on her myocardial cells making her heart failure worse. -CMET: Check albumin and SCr -Losartan was previously discontinue because patient was hyperkalemic last month. If potassium stays stable and creatinine at baseline on today's labs, will re-start losartan for hypertension and for its renoprotective effects. -Compression stockings -Reduce dietary sodium intake  Addendum 02/14/2016 at 1:41 pm: SCr at baseline, K normal, an albumin normal on today's labs. Re-start Losartan 50 mg daily. Spoke to the patient over the phone and she  agrees with the treatment plan. She has a follow-up appointment scheduled on 10/27.

## 2016-02-17 NOTE — Progress Notes (Signed)
Internal Medicine Clinic Attending  Case discussed with Dr. Marlowe Sax at the time of the visit.  We reviewed the resident's history and exam and pertinent patient test results.  I agree with the assessment, diagnosis, and plan of care documented in the resident's note. Would classify this as a mild acute exacerbation of her chronic diastolic CHF.  We will increase her diauretic for a week and have her back.  For her proteinuria will will change amlodipine to losartan.

## 2016-02-19 ENCOUNTER — Encounter: Payer: Self-pay | Admitting: *Deleted

## 2016-02-19 ENCOUNTER — Other Ambulatory Visit: Payer: Self-pay | Admitting: *Deleted

## 2016-02-19 NOTE — Patient Outreach (Signed)
[facility]) Care Management  02/19/2016   Kathleen [name] [birth year] [id number]  Subjective: RN Health Coach telephone call to patient.  [employer] compliance verified. Per patient her fasting blood sugar today is 116. Patient stated they have been all over the place since she had some fluid build up in her ankle and some shortness of breath and was placed on Lasix. Patient stated the Drs have been monitoring her kidney also. Patient stated now her blood sugars are getting back under control. Patient stated that she has gotten off her diet at times. Patient current weight is 288.8. Patient is still going to exercise at the [facility].  Patient stated she went to the eye Dr and was told she has fluid behind the eye, Patient has been receiving shots in the eye x 2 so far. Patient has agreed to follow up outreach calls  Objective:   Current Medications:  Current Outpatient Prescriptions  Medication Sig Dispense Refill  . ACCU-CHEK FASTCLIX LANCETS MISC TEST BLOOD SUGAR THREE TIMES DAILY 306 each 1  . ACCU-CHEK SMARTVIEW test strip TEST BLOOD SUGAR THREE TIMES DAILY 300 each 1  . amitriptyline (ELAVIL) 50 MG tablet Take 0.5 tablets (25 mg total) by mouth at bedtime. Reported on 04/17/2015 30 tablet 2  . aspirin 81 MG tablet Take 81 mg by mouth daily.    . Blood Glucose Monitoring Suppl (ACCU-CHEK NANO SMARTVIEW) W/DEVICE KIT Check blood sugar 3 times daily 1 kit 0  . bumetanide (BUMEX) 1 MG tablet Take 1 tablet (1 mg total) by mouth 2 (two) times daily. 90 tablet 3  . dicyclomine (BENTYL) 20 MG tablet TAKE 1 TABLET (20 MG TOTAL) BY MOUTH 3 (THREE) TIMES DAILY AS NEEDED FOR SPASMS. 90 tablet 1  . gabapentin (NEURONTIN) 300 MG capsule TAKE 1 CAPSULE THREE TIMES DAILY 270 capsule 2  . insulin detemir (LEVEMIR) 100 UNIT/ML injection Inject 0.87 mLs (87 Units total) into the skin at bedtime. 30 mL 5  . losartan (COZAAR) 50 MG tablet Take 1 tablet (50 mg total) by mouth daily. 90 tablet 1  .  metFORMIN (GLUCOPHAGE) 1000 MG tablet TAKE 1 TABLET (1,000 MG TOTAL) BY MOUTH 2 (TWO) TIMES DAILY WITH A MEAL. 180 tablet 3  . NON FORMULARY Place 2 L into the nose daily. Wears with exertion and qhs    . omeprazole (PRILOSEC) 20 MG capsule TAKE 1 CAPSULE (20 MG TOTAL) BY MOUTH DAILY. 90 capsule 1  . Insulin Syringe-Needle U-100 31G X 15/64" 1 ML MISC Use to inject insulin one time a day 100 each 5  . Multiple Vitamins-Minerals (HEALTHY EYES PO) Take by mouth daily. Reported on 05/17/2015     No current facility-administered medications for this visit.     Functional Status:  In your present state of health, do you have any difficulty performing the following activities: 02/19/2016 02/14/2016  Hearing? N N  Vision? N N  Difficulty concentrating or making decisions? N N  Walking or climbing stairs? Y Y  Dressing or bathing? N N  Doing errands, shopping? N N  Preparing Food and eating ? - -  Using the Toilet? - -  In the past six months, have you accidently leaked urine? N -  Do you have problems with loss of bowel control? N -  Managing your Medications? N -  Managing your Finances? N -  Housekeeping or managing your Housekeeping? N -  Some recent data might be hidden    Fall/Depression Screening: PHQ 2/9 Scores  02/19/2016 02/14/2016 01/24/2016 01/10/2016 12/27/2015 12/20/2015 12/13/2015  PHQ - 2 Score 0 0 0 0 0 0 0   THN CM Care Plan Problem One   Flowsheet Row Most Recent Value  Care Plan Problem One  Knowledge deficit in self management of diabetes  Role Documenting the Problem One  Hall for Problem One  Active  THN Long Term Goal (31-90 days)  Patient will have a decrease of HGB A1C by one point in 90 days  Interventions for Problem One [place] discussed with patient what the  A1C is.  RN discussed how A1C is affected by the blood sugars.  RN sent EMMI information on  Why get A1C checked. RN sent educational material on Know your goal  numbers. RN sent educational material on How the A1C helps.RN discussed possible reasons why patient A1C went up. RN Health Coach will follow up within 30 days for  discussion and teach back.  THN CM Short Term Goal #3 (0-30 days)  Patient will be able to verbalize foods high and low in carbohydrates within 30 days  Interventions for Short Tern Goal #3  RN sent patient a picture chart on carbohydrates. RN sent EMMI information on counting carbohydrates. RN sent educational material on creating a healthy meal. RN will follow up within a month. RN sent patient a list of healthy snacks. RN will  reiterate eating a snack at bedtime. RN discuss with patient about looking at the snacks and not over eating since she has a little weight gain  THN CM Short Term Goal #4 (0-30 days)  Patient will report her blood sugars are under 200 within the next 30 days  Interventions for Short Term Goal #4  Patient will choose a healthier snack at [facility] to help her blood sugar decrease in am. Patient understand she can do exercise at hs to help blood sugars. Patient understands she can drink water to keep hydrated.  THN CM Short Term Goal #5 (0-30 days)  Patient will be able  to verbalize  what she needs to do to Maintaining her overall health within 30 days  Interventions for Short Term Goal #5  RN sent patient educational material on maintaining her health. RN will send educational on Diabetic retinopathy. RN will follow up with discussion and teach back        Assessment:  Patient needs encouragement to adhere to diet Patient went for eye exam Patient needs to make appointment for  Podiatry exam Patient will continue to  benefit from Health Coach telephonic outreach for education and support for diabetes self management.  Plan:  RN sent 2017-208 calendar book for documentation RN reiterated the zones of CHF and action plan RN sent educational information on Diabetic retinopathy RN sent a zone and action plan sheet on  CHF RN sent educational material on peripheral edema RN will follow up within the month of November  Mahamed Zalewski [place] [facility] [phone]

## 2016-02-20 ENCOUNTER — Telehealth: Payer: Self-pay | Admitting: Internal Medicine

## 2016-02-20 NOTE — Telephone Encounter (Signed)
APT. REMINDER CALL, LMTCB °

## 2016-02-21 ENCOUNTER — Ambulatory Visit (INDEPENDENT_AMBULATORY_CARE_PROVIDER_SITE_OTHER): Payer: Commercial Managed Care - HMO | Admitting: Internal Medicine

## 2016-02-21 VITALS — BP 132/61 | HR 50 | Temp 97.8°F | Ht 60.0 in | Wt 291.5 lb

## 2016-02-21 DIAGNOSIS — Z9981 Dependence on supplemental oxygen: Secondary | ICD-10-CM

## 2016-02-21 DIAGNOSIS — I5032 Chronic diastolic (congestive) heart failure: Secondary | ICD-10-CM | POA: Diagnosis not present

## 2016-02-21 DIAGNOSIS — J9611 Chronic respiratory failure with hypoxia: Secondary | ICD-10-CM | POA: Diagnosis not present

## 2016-02-21 DIAGNOSIS — I110 Hypertensive heart disease with heart failure: Secondary | ICD-10-CM | POA: Diagnosis not present

## 2016-02-21 DIAGNOSIS — I10 Essential (primary) hypertension: Secondary | ICD-10-CM

## 2016-02-21 DIAGNOSIS — Z79899 Other long term (current) drug therapy: Secondary | ICD-10-CM

## 2016-02-21 MED ORDER — BUMETANIDE 1 MG PO TABS: 1.0000 mg | ORAL_TABLET | Freq: Three times a day (TID) | ORAL | 3 refills | Status: DC

## 2016-02-21 NOTE — Assessment & Plan Note (Addendum)
BP Readings from Last 3 Encounters:  02/21/16 132/61  02/14/16 (!) 113/50  01/24/16 132/69   Lab Results  Component Value Date   CREATININE 1.67 (H) 02/14/2016   Lab Results  Component Value Date   K 4.4 02/14/2016    Current medications: losartan 50 mg daily, bumetanide 1 mg TID  Losartan was briefly discontinued for hyperkalemia and replaced by amlodipine, but losartan resumed and amlodipine discontinued last week.  Assessment BP goal: 140/90 BP control: well controlled  Plan Medications: continue current meds Other: -BMP today with increased bumetanide dose

## 2016-02-21 NOTE — Assessment & Plan Note (Addendum)
She continues to have gross LE edema, though her legs are less painful than last week.  Lungs are clearing, breathing is at baseline (requiring 2L O2 for sleep and activity).  She has gained 25+ lbs in the last 6 months.  With history of CKD and proteinuria, CMP was checked last week and albumin found to be normal.  Last echocardiogram 12/2014, normal EF and unable to assess diastolic function.  With her weight gain, peripheral edema, and chronic respiratory failure, concern for progressing right heart failure. -Continue bumetanide 1 mg TID -BMP today -Repeat echo -Daily home weights -Compression stockings -Low sodium diet -F/u 2 weeks

## 2016-02-21 NOTE — Progress Notes (Signed)
CC: swelling in legs  HPI:  [name] is a 65 y.o. woman with history of CHF, DM, HTN, and CKD who is here for 1 week follow-up of CHF exacerbation.  She was seen in clinic on 10/20 with worsening bilateral LE swelling for 1 week.  Stable 2L oxygen requirement at night and with exertion.  On bumetanide 1mg  BID.  She was instructed to increased bumetanide to TID, re-start Losartan 50 mg, discontinued amlodipine, and wear compression stockings.  Feels like her legs are less swollen and painful, breathing is at baseline, no CP.  Weighs herself at home every day, down ~5 lbs since last week.  Has been taking Bumetanide TID, Losartan.  Has not gotten compression stockings yet, waiting for [place] in Nov.  Low appetite.  Still eating a bit of canned tuna.  Always feel dizzy if she stands up fast, no change.  Weight steadily icnreasing over past year, up from 265 in March.  Weight stable, down 4 lbs from last week.   Past Medical History:  Diagnosis Date  . Chronic diastolic (congestive) heart failure 01/10/2015  . Chronic diastolic CHF (congestive heart failure) ([place])   . Degenerative joint disease of hand   . Diabetes mellitus   . Dyslipidemia   . Fecal occult blood test positive   . GERD (gastroesophageal reflux disease)   . Headache   . Hypertension   . Inadequate material resources   . Irritable bowel syndrome   . Obesity   . Post-menopausal bleeding   . Postmenopausal   . Shortness of breath dyspnea    multifactorial from obesity, deconditioning, obesity hypoventilation syndrome    Review of Systems:   Review of Systems  Constitutional: Negative for chills and fever.  Respiratory: Positive for shortness of breath.   Cardiovascular: Positive for leg swelling. Negative for chest pain and palpitations.    Physical Exam:  Vitals:   02/21/16 1051  BP: 132/61  Pulse: (!) 50  Temp: 97.8 F (36.6 C)  TempSrc: Oral  SpO2: (!) 87%  Weight: 291  lb 8 oz (132.2 kg)  Height: 5' (1.524 m)   Weight last week 295  Physical Exam  Constitutional: She is oriented to person, place, and time.  Obese woman in no distress  Cardiovascular:  Bradycardic, regular rhythm, no gallop  Pulmonary/Chest: Effort normal and breath sounds normal. She has no wheezes. She has no rales.  Musculoskeletal:  Bilateral LE 3+ pitting edema to at least knees Non-tender  Neurological: She is alert and oriented to person, place, and time.  Psychiatric: She has a normal mood and affect. Her behavior is normal.    CMP Latest Ref Rng & Units 02/14/2016 01/24/2016 01/16/2016  Glucose 65 - 99 mg/dL 110(H) 87 143(H)  BUN 6 - 20 mg/dL 38(H) 38(H) 32(H)  Creatinine 0.44 - 1.00 mg/dL 1.67(H) 1.76(H) 1.64(H)  Sodium 135 - 145 mmol/L 140 141 138  Potassium 3.5 - 5.1 mmol/L 4.4 3.7 4.5  Chloride 101 - 111 mmol/L 101 105 101  CO2 22 - 32 mmol/L 31 29 28   Calcium 8.9 - 10.3 mg/dL 9.2 9.1 9.0  Total Protein 6.5 - 8.1 g/dL 7.2 - -  Total Bilirubin 0.3 - 1.2 mg/dL 0.9 - -  Alkaline Phos 38 - 126 U/L 102 - -  AST 15 - 41 U/L 15 - -  ALT 14 - 54 U/L 11(L) - -    Assessment & Plan:   See Encounters Tab for problem based  charting.  Patient seen with Dr. Evette Doffing

## 2016-02-21 NOTE — Patient Instructions (Signed)
You were seen today for follow-up of your swelling and heart failure.  I'm glad you're feeling better.  However, you still have lots of swelling in your legs.    Please continue taking your Bumex 3 times a day.  We'll check blood tests again today to make sure your kidneys are handling that ok, and I'll call you if you should do anything different.  I'd also like you to schedule another echocardiogram to look at your heart in the next few weeks.  Try and get those compression stockings as soon as possible, and wear them as much as you can while awake.  Avoid salty foods.  Don't eat canned tuna.  Follow-up with [place] again in 2 weeks, and keep weighing yourself every day.  If you gain weight, please call the clinic.  If you can't breath or have chest pain, call 911 and go the ED instead.    Low-Sodium Eating Plan Sodium raises blood pressure and causes water to be held in the body. Getting less sodium from food will help lower your blood pressure, reduce any swelling, and protect your heart, liver, and kidneys. We get sodium by adding salt (sodium chloride) to food. Most of our sodium comes from canned, boxed, and frozen foods. Restaurant foods, fast foods, and pizza are also very high in sodium. Even if you take medicine to lower your blood pressure or to reduce fluid in your body, getting less sodium from your food is important. WHAT IS MY PLAN? Most people should limit their sodium intake to 2,300 mg a day. Your health care provider recommends that you limit your sodium intake to __________ a day.  WHAT DO I NEED TO KNOW ABOUT THIS EATING PLAN? For the low-sodium eating plan, you will follow these general guidelines:  Choose foods with a % Daily Value for sodium of less than 5% (as listed on the food label).   Use salt-free seasonings or herbs instead of table salt or sea salt.   Check with your health care provider or pharmacist before using salt substitutes.   Eat fresh foods.  Eat  more vegetables and fruits.  Limit canned vegetables. If you do use them, rinse them well to decrease the sodium.   Limit cheese to 1 oz (28 g) per day.   Eat lower-sodium products, often labeled as "lower sodium" or "no salt added."  Avoid foods that contain monosodium glutamate (MSG). MSG is sometimes added to [place] food and some canned foods.  Check food labels (Nutrition Facts labels) on foods to learn how much sodium is in one serving.  Eat more home-cooked food and less restaurant, buffet, and fast food.  When eating at a restaurant, ask that your food be prepared with less salt, or no salt if possible.  HOW DO I READ FOOD LABELS FOR SODIUM INFORMATION? The Nutrition Facts label lists the amount of sodium in one serving of the food. If you eat more than one serving, you must multiply the listed amount of sodium by the number of servings. Food labels may also identify foods as:  Sodium free--Less than 5 mg in a serving.  Very low sodium--35 mg or less in a serving.  Low sodium--140 mg or less in a serving.  Light in sodium--50% less sodium in a serving. For example, if a food that usually has 300 mg of sodium is changed to become light in sodium, it will have 150 mg of sodium.  Reduced sodium--25% less sodium in a serving. For  example, if a food that usually has 400 mg of sodium is changed to reduced sodium, it will have 300 mg of sodium. WHAT FOODS CAN I EAT? Grains Low-sodium cereals, including oats, puffed wheat and rice, and shredded wheat cereals. Low-sodium crackers. Unsalted rice and pasta. Lower-sodium bread.  Vegetables Frozen or fresh vegetables. Low-sodium or reduced-sodium canned vegetables. Low-sodium or reduced-sodium tomato sauce and paste. Low-sodium or reduced-sodium tomato and vegetable juices.  Fruits Fresh, frozen, and canned fruit. Fruit juice.  Meat and Other Protein Products Low-sodium canned tuna and salmon. Fresh or frozen meat, poultry,  seafood, and fish. Lamb. Unsalted nuts. Dried beans, peas, and lentils without added salt. Unsalted canned beans. Homemade soups without salt. Eggs.  Dairy Milk. Soy milk. Ricotta cheese. Low-sodium or reduced-sodium cheeses. Yogurt.  Condiments Fresh and dried herbs and spices. Salt-free seasonings. Onion and garlic powders. Low-sodium varieties of mustard and ketchup. Fresh or refrigerated horseradish. Lemon juice.  Fats and Oils Reduced-sodium salad dressings. Unsalted butter.  Other Unsalted popcorn and pretzels.  The items listed above may not be a complete list of recommended foods or beverages. Contact your dietitian for more options. WHAT FOODS ARE NOT RECOMMENDED? Grains Instant hot cereals. Bread stuffing, pancake, and biscuit mixes. Croutons. Seasoned rice or pasta mixes. Noodle soup cups. Boxed or frozen macaroni and cheese. Self-rising flour. Regular salted crackers. Vegetables Regular canned vegetables. Regular canned tomato sauce and paste. Regular tomato and vegetable juices. Frozen vegetables in sauces. Salted [place] fries. Olives. Angie Fava. Relishes. Sauerkraut. Salsa. Meat and Other Protein Products Salted, canned, smoked, spiced, or pickled meats, seafood, or fish. Bacon, ham, sausage, hot dogs, corned beef, chipped beef, and packaged luncheon meats. Salt pork. Jerky. Pickled herring. Anchovies, regular canned tuna, and sardines. Salted nuts. Dairy Processed cheese and cheese spreads. Cheese curds. Blue cheese and cottage cheese. Buttermilk.  Condiments Onion and garlic salt, seasoned salt, table salt, and sea salt. Canned and packaged gravies. Worcestershire sauce. Tartar sauce. Barbecue sauce. Teriyaki sauce. Soy sauce, including reduced sodium. Steak sauce. Fish sauce. Oyster sauce. Cocktail sauce. Horseradish that you find on the shelf. Regular ketchup and mustard. Meat flavorings and tenderizers. Bouillon cubes. Hot sauce. Tabasco sauce. Marinades. Taco  seasonings. Relishes. Fats and Oils Regular salad dressings. Salted butter. Margarine. Ghee. Bacon fat.  Other Potato and tortilla chips. Corn chips and puffs. Salted popcorn and pretzels. Canned or dried soups. Pizza. Frozen entrees and pot pies.  The items listed above may not be a complete list of foods and beverages to avoid. Contact your dietitian for more information.   This information is not intended to replace advice given to you by your health care provider. Make sure you discuss any questions you have with your health care provider.   Document Released: 10/03/2001 Document Revised: 05/04/2014 Document Reviewed: 02/15/2013 Elsevier Interactive Patient Education [employer].

## 2016-02-22 LAB — BMP8+ANION GAP
Anion Gap: 17 mmol/L (ref 10.0–18.0)
BUN/Creatinine Ratio: 22 (ref 12–28)
BUN: 36 mg/dL — AB (ref 8–27)
CALCIUM: 9.1 mg/dL (ref 8.7–10.3)
CO2: 27 mmol/L (ref 18–29)
Chloride: 99 mmol/L (ref 96–106)
Creatinine, Ser: 1.61 mg/dL — ABNORMAL HIGH (ref 0.57–1.00)
GFR, EST AFRICAN AMERICAN: 39 mL/min/{1.73_m2} — AB (ref >59)
GFR, EST NON AFRICAN AMERICAN: 34 mL/min/{1.73_m2} — AB (ref >59)
Glucose: 108 mg/dL — ABNORMAL HIGH (ref 65–99)
Potassium: 4.2 mmol/L (ref 3.5–5.2)
Sodium: 143 mmol/L (ref 134–144)

## 2016-02-24 ENCOUNTER — Encounter: Payer: Self-pay | Admitting: Internal Medicine

## 2016-02-25 ENCOUNTER — Telehealth: Payer: Self-pay | Admitting: Cardiology

## 2016-02-25 ENCOUNTER — Other Ambulatory Visit: Payer: Self-pay | Admitting: Internal Medicine

## 2016-02-25 ENCOUNTER — Ambulatory Visit: Payer: Commercial Managed Care - HMO | Admitting: Podiatry

## 2016-02-25 NOTE — Telephone Encounter (Signed)
Left message to call back  

## 2016-02-25 NOTE — Progress Notes (Signed)
Internal Medicine Clinic Attending  I saw and evaluated the patient.  I personally confirmed the key portions of the history and exam documented by Dr. O'Sullivan and I reviewed pertinent patient test results.  The assessment, diagnosis, and plan were formulated together and I agree with the documentation in the resident's note.   

## 2016-02-25 NOTE — Telephone Encounter (Signed)
Patient is returning a call from [place] regarding test results.  Pls call patient back after 2pm

## 2016-02-26 NOTE — Telephone Encounter (Signed)
Left message to call back  

## 2016-03-04 ENCOUNTER — Ambulatory Visit (INDEPENDENT_AMBULATORY_CARE_PROVIDER_SITE_OTHER): Payer: Commercial Managed Care - HMO | Admitting: Podiatry

## 2016-03-04 ENCOUNTER — Encounter: Payer: Self-pay | Admitting: Podiatry

## 2016-03-04 ENCOUNTER — Encounter (INDEPENDENT_AMBULATORY_CARE_PROVIDER_SITE_OTHER): Payer: Commercial Managed Care - HMO | Admitting: Ophthalmology

## 2016-03-04 VITALS — BP 111/44 | HR 54 | Resp 18

## 2016-03-04 DIAGNOSIS — M79676 Pain in unspecified toe(s): Secondary | ICD-10-CM | POA: Diagnosis not present

## 2016-03-04 DIAGNOSIS — B351 Tinea unguium: Secondary | ICD-10-CM

## 2016-03-04 NOTE — Progress Notes (Signed)
Patient ID: Kathleen [name], female   DOB: [birth year], 65 y.o.   MRN: [id number]    Subjective: This patient presents again for schedule visit complaining of painful toenails and walking wearing shoes and is requesting nail debridement  Objective: Orientated 3 DP PT pulses 1/4 bilaterally Capillary reflex immediate bilaterally Sensation to 10 g monofilament wire intact 2/5 right 1/5 left Vibratory sensation nonreactive bilaterally Ankle reflex equal and reactive bilaterally Scaling skin bilaterally Varus rotated fourth and fifth toes bilaterally Open skin lesions bilaterally The toenails are hypertrophic, elongated, incurvated, discolored and tender direct palpation 6-10  Assessment: Diabetic with peripheral neuropathy Decrease pedal pulses suggestive you have possible peripheral arterial disease without any open wounds Symptomatic onychomycoses 6-10  Plan: Debrided toenails 10 mechanically and electrically without any bleeding  Reappoint 3 months

## 2016-03-04 NOTE — Patient Instructions (Signed)
Diabetes and Foot Care Diabetes may cause you to have problems because of poor blood supply (circulation) to your feet and legs. This may cause the skin on your feet to become thinner, break easier, and heal more slowly. Your skin may become dry, and the skin may peel and crack. You may also have nerve damage in your legs and feet causing decreased feeling in them. You may not notice minor injuries to your feet that could lead to infections or more serious problems. Taking care of your feet is one of the most important things you can do for yourself.  HOME CARE INSTRUCTIONS  Wear shoes at all times, even in the house. Do not go barefoot. Bare feet are easily injured.  Check your feet daily for blisters, cuts, and redness. If you cannot see the bottom of your feet, use a mirror or ask someone for help.  Wash your feet with warm water (do not use hot water) and mild soap. Then pat your feet and the areas between your toes until they are completely dry. Do not soak your feet as this can dry your skin.  Apply a moisturizing lotion or petroleum jelly (that does not contain alcohol and is unscented) to the skin on your feet and to dry, brittle toenails. Do not apply lotion between your toes.  Trim your toenails straight across. Do not dig under them or around the cuticle. File the edges of your nails with an emery board or nail file.  Do not cut corns or calluses or try to remove them with medicine.  Wear clean socks or stockings every day. Make sure they are not too tight. Do not wear knee-high stockings since they may decrease blood flow to your legs.  Wear shoes that fit properly and have enough cushioning. To break in new shoes, wear them for just a few hours a day. This prevents you from injuring your feet. Always look in your shoes before you put them on to be sure there are no objects inside.  Do not cross your legs. This may decrease the blood flow to your feet.  If you find a minor scrape,  cut, or break in the skin on your feet, keep it and the skin around it clean and dry. These areas may be cleansed with mild soap and water. Do not cleanse the area with peroxide, alcohol, or iodine.  When you remove an adhesive bandage, be sure not to damage the skin around it.  If you have a wound, look at it several times a day to make sure it is healing.  Do not use heating pads or hot water bottles. They may burn your skin. If you have lost feeling in your feet or legs, you may not know it is happening until it is too late.  Make sure your health care provider performs a complete foot exam at least annually or more often if you have foot problems. Report any cuts, sores, or bruises to your health care provider immediately. SEEK MEDICAL CARE IF:   You have an injury that is not healing.  You have cuts or breaks in the skin.  You have an ingrown nail.  You notice redness on your legs or feet.  You feel burning or tingling in your legs or feet.  You have pain or cramps in your legs and feet.  Your legs or feet are numb.  Your feet always feel cold. SEEK IMMEDIATE MEDICAL CARE IF:   There is increasing redness,   swelling, or pain in or around a wound.  There is a red line that goes up your leg.  Pus is coming from a wound.  You develop a fever or as directed by your health care provider.  You notice a bad smell coming from an ulcer or wound.   This information is not intended to replace advice given to you by your health care provider. Make sure you discuss any questions you have with your health care provider.   Document Released: 04/10/2000 Document Revised: 12/14/2012 Document Reviewed: 09/20/2012 Elsevier Interactive Patient Education 2016 Elsevier Inc.  

## 2016-03-05 ENCOUNTER — Telehealth: Payer: Self-pay | Admitting: Internal Medicine

## 2016-03-05 NOTE — Telephone Encounter (Signed)
APT. REMINDER CALL, LMTCB °

## 2016-03-06 ENCOUNTER — Ambulatory Visit (INDEPENDENT_AMBULATORY_CARE_PROVIDER_SITE_OTHER): Payer: Commercial Managed Care - HMO | Admitting: Internal Medicine

## 2016-03-06 ENCOUNTER — Encounter: Payer: Self-pay | Admitting: Internal Medicine

## 2016-03-06 VITALS — BP 124/53 | HR 52 | Temp 98.2°F | Wt 286.0 lb

## 2016-03-06 DIAGNOSIS — E119 Type 2 diabetes mellitus without complications: Secondary | ICD-10-CM

## 2016-03-06 DIAGNOSIS — Z794 Long term (current) use of insulin: Secondary | ICD-10-CM

## 2016-03-06 DIAGNOSIS — Z9981 Dependence on supplemental oxygen: Secondary | ICD-10-CM

## 2016-03-06 DIAGNOSIS — B351 Tinea unguium: Secondary | ICD-10-CM | POA: Diagnosis not present

## 2016-03-06 DIAGNOSIS — I5032 Chronic diastolic (congestive) heart failure: Secondary | ICD-10-CM | POA: Diagnosis not present

## 2016-03-06 DIAGNOSIS — E1169 Type 2 diabetes mellitus with other specified complication: Secondary | ICD-10-CM

## 2016-03-06 NOTE — Progress Notes (Signed)
   CC: Chronic diastolic congestive heart failure  HPI:  [name] is a 65 y.o. female who presents today for chronic diastolic congestive heart failure. Please see assessment & plan for status of chronic medical problems.   Past Medical History:  Diagnosis Date  . Chronic diastolic (congestive) heart failure 01/10/2015  . Chronic diastolic CHF (congestive heart failure) ([place])   . Degenerative joint disease of hand   . Diabetes mellitus   . Dyslipidemia   . Fecal occult blood test positive   . GERD (gastroesophageal reflux disease)   . Headache   . Hypertension   . Inadequate material resources   . Irritable bowel syndrome   . Obesity   . Post-menopausal bleeding   . Postmenopausal   . Shortness of breath dyspnea    multifactorial from obesity, deconditioning, obesity hypoventilation syndrome    Review of Systems:  Please see each problem below for a pertinent review of systems.  Physical Exam:  Vitals:   03/06/16 1003  BP: (!) 124/53  Pulse: (!) 52  Temp: 98.2 F (36.8 C)  TempSrc: Oral  SpO2: 91%  Weight: 286 lb (129.7 kg)   Physical Exam  Constitutional: She is oriented to person, place, and time. No distress.  HENT:  Head: Normocephalic and atraumatic.  Eyes: Conjunctivae are normal. No scleral icterus.  Cardiovascular:  Questionable ejection murmur though difficult to clearly auscultate given large body habitus  Pulmonary/Chest: Effort normal. No respiratory distress. She has no wheezes.  Diminished breath sounds in all lung fields, likely a consequence of large body habitus.  Musculoskeletal: She exhibits edema (1+ on the left, 2+ on the right.).  2+ DP pulses bilaterally. No rashes noted at the dorsum of feet. Onychomycosis noted of the toenails both feet.  Neurological: She is alert and oriented to person, place, and time.  Skin: Skin is warm and dry. She is not diaphoretic.     Assessment & Plan:   See Encounters Tab for problem based  charting.  Patient discussed with Dr. Daryll Drown

## 2016-03-06 NOTE — Patient Instructions (Signed)
Keep up the good work! Please come back to see Dr. Dareen Piano at the end of the month.

## 2016-03-06 NOTE — Assessment & Plan Note (Signed)
Assessment She was seen by her podiatrist earlier this week and was noted to have "good feet." She is agreeable to having her foot exam today.  Plan -Perform foot exam

## 2016-03-06 NOTE — Assessment & Plan Note (Signed)
Assessment Her weight today is 286 pounds,down from 291 pounds on 02/21/16. Her current regimen is bumetanide 1 mg 3 times daily. Her most recent echo 01/02/15 was notable for preserved ejection fraction 28-36% though diastolic function was not noted. Elevated central venous pressures in the IVC raises concern for right-sided heart failure. A repeat cardiac echo was ordered at her last office visit 2 weeks ago though has not been scheduled per review of the chart.  Today, she feels markedly better. She is able to identify her current diuretic regimen as bumetanide 1 mg 3 times daily. She is unable to walk up a flight of stairs though feels the swelling in her legs and feet are significantly improved and are no longer painful when she exerts herself. She is still wearing 2 L of oxygen at night. She denies any decreased energy,nausea, vomiting, abdominal pain, or gastrointestinal symptoms suggestive of right-sided heart failure.  Her physical exam does show persistent peripheral edema, but given her symptomatic improvement, I'm okay with continuing her current regimen with close PCP follow-up at the end of the month. She is scheduled to follow-up with cardiology next month as well.  Plan -Continue bumetanide 1 mg 3 times daily -Encouraged the patient to practice dietary discretion with the holiday season upcoming to which she agreed

## 2016-03-07 ENCOUNTER — Other Ambulatory Visit: Payer: Self-pay | Admitting: Internal Medicine

## 2016-03-10 ENCOUNTER — Encounter (INDEPENDENT_AMBULATORY_CARE_PROVIDER_SITE_OTHER): Payer: Commercial Managed Care - HMO | Admitting: Ophthalmology

## 2016-03-10 DIAGNOSIS — E11311 Type 2 diabetes mellitus with unspecified diabetic retinopathy with macular edema: Secondary | ICD-10-CM

## 2016-03-10 DIAGNOSIS — I10 Essential (primary) hypertension: Secondary | ICD-10-CM

## 2016-03-10 DIAGNOSIS — H35033 Hypertensive retinopathy, bilateral: Secondary | ICD-10-CM

## 2016-03-10 DIAGNOSIS — H43813 Vitreous degeneration, bilateral: Secondary | ICD-10-CM

## 2016-03-10 DIAGNOSIS — E113313 Type 2 diabetes mellitus with moderate nonproliferative diabetic retinopathy with macular edema, bilateral: Secondary | ICD-10-CM

## 2016-03-10 NOTE — Progress Notes (Signed)
Internal Medicine Clinic Attending  Case discussed with Dr. Patel soon after the resident saw the patient.  We reviewed the resident's history and exam and pertinent patient test results.  I agree with the assessment, diagnosis, and plan of care documented in the resident's note. 

## 2016-03-10 NOTE — Telephone Encounter (Signed)
Left message to call back  

## 2016-03-16 ENCOUNTER — Other Ambulatory Visit: Payer: Self-pay | Admitting: *Deleted

## 2016-03-16 ENCOUNTER — Encounter: Payer: Self-pay | Admitting: *Deleted

## 2016-03-16 NOTE — Patient Outreach (Signed)
[facility]) Care Management  03/16/2016   Kathleen [name] 10/07/1950 [id number]  Subjective: RN Health Coach telephone call to patient.  [place] compliance verified.  Per patient her blood sugar fasting today was 146. Patient is back exercising regularly in the gym. Patient is taking blood sugars as per ordered. Patient does get short of breath when climbing stairs. She does wear oxygen at night . Per patient she is following diet better. It is harder with the Holidays approaching.  Patient has been going to Podiatry for foot care. Patient is going to eye Dr for shots in eye due to fluid build. Patient is going to PCP and cardiology as per ordered. Patient was instructed to get support hose. Patient didn't know the size or have a tape measure. Patient guessed at size and ordered from catalog. .Patient has agreed to follow up outreach calls. Objective:   Current Medications:  Current Outpatient Prescriptions  Medication Sig Dispense Refill  . ACCU-CHEK FASTCLIX LANCETS MISC TEST BLOOD SUGAR THREE TIMES DAILY 306 each 1  . ACCU-CHEK SMARTVIEW test strip TEST BLOOD SUGAR THREE TIMES DAILY 300 each 1  . amitriptyline (ELAVIL) 50 MG tablet Take 0.5 tablets (25 mg total) by mouth at bedtime. Reported on 04/17/2015 30 tablet 2  . aspirin 81 MG tablet Take 81 mg by mouth daily.    . Blood Glucose Monitoring Suppl (ACCU-CHEK NANO SMARTVIEW) W/DEVICE KIT Check blood sugar 3 times daily 1 kit 0  . bumetanide (BUMEX) 1 MG tablet Take 1 tablet (1 mg total) by mouth 3 (three) times daily. 90 tablet 3  . dicyclomine (BENTYL) 20 MG tablet TAKE 1 TABLET (20 MG TOTAL) BY MOUTH 3 (THREE) TIMES DAILY AS NEEDED FOR SPASMS. 90 tablet 1  . gabapentin (NEURONTIN) 300 MG capsule TAKE 1 CAPSULE THREE TIMES DAILY 270 capsule 2  . insulin detemir (LEVEMIR) 100 UNIT/ML injection Inject 0.87 mLs (87 Units total) into the skin at bedtime. 30 mL 5  . Insulin Syringe-Needle U-100 31G X 15/64" 1 ML MISC Use to  inject insulin one time a day 100 each 5  . losartan (COZAAR) 50 MG tablet Take 1 tablet (50 mg total) by mouth daily. 90 tablet 1  . metFORMIN (GLUCOPHAGE) 1000 MG tablet TAKE 1 TABLET (1,000 MG TOTAL) BY MOUTH 2 (TWO) TIMES DAILY WITH A MEAL. 180 tablet 3  . Multiple Vitamins-Minerals (HEALTHY EYES PO) Take by mouth daily. Reported on 05/17/2015    . NON FORMULARY Place 2 L into the nose daily. Wears with exertion and qhs    . omeprazole (PRILOSEC) 20 MG capsule TAKE 1 CAPSULE (20 MG TOTAL) BY MOUTH DAILY. 90 capsule 1   No current facility-administered medications for this visit.     Functional Status:  In your present state of health, do you have any difficulty performing the following activities: 03/16/2016 03/06/2016  Hearing? N N  Vision? Y Y  Difficulty concentrating or making decisions? N N  Walking or climbing stairs? Y Y  Dressing or bathing? N N  Doing errands, shopping? N N  Preparing Food and eating ? N -  Using the Toilet? N -  In the past six months, have you accidently leaked urine? N -  Do you have problems with loss of bowel control? N -  Managing your Medications? N -  Managing your Finances? N -  Housekeeping or managing your Housekeeping? N -  Some recent data might be hidden    Fall/Depression Screening: PHQ 2/9 Scores  03/16/2016 03/06/2016 02/21/2016 02/19/2016 02/14/2016 01/24/2016 01/10/2016  PHQ - 2 Score 0 0 0 0 0 0 0   THN CM Care Plan Problem One   Flowsheet Row Most Recent Value  Care Plan Problem One  Knowledge deficit in self management of diabetes  Role Documenting the Problem One  [place] for Problem One  Active  THN Long Term Goal (31-90 days)  Patient will have a decrease of HGB A1C by one point in 90 days  THN Long Term Goal Start Date  12/20/15  Interventions for Problem One [place] discussed with patient what the  A1C is.  RN discussed how A1C is affected by the blood sugars.  RN sent EMMI information  on  Why get A1C checked. RN sent educational material on Know your goal numbers. RN sent educational material on How the A1C helps.RN discussed possible reasons why patient A1C went up. RN Health Coach will follow up within 30 days for  discussion and teach back.  THN CM Short Term Goal #3 (0-30 days)  Patient will be able to verbalize foods high and low in carbohydrates within 30 days  THN CM Short Term Goal #3 Start Date  03/16/16  Interventions for Short Tern Goal #3  RN sent patient a picture chart on carbohydrates. RN sent EMMI information on counting carbohydrates. RN sent educational material on creating a healthy meal. RN will follow up within a month. RN sent patient a list of healthy snacks. RN will  reiterate eating a snack at bedtime. RN discuss with patient about looking at the snacks and not over eating since she has a little weight gain  THN CM Short Term Goal #4 (0-30 days)  Patient will report her blood sugars are under 200 within the next 30 days  Interventions for Short Term Goal #4  Patient will choose a healthier snack at [facility] to help her blood sugar decrease in am. Patient understand she can do exercise at hs to help blood sugars. Patient understands she can drink water to keep hydrated.  THN CM Short Term Goal #5 (0-30 days)  Patient will be able  to verbalize  what she needs to do to Maintaining her overall health within 30 days  THN CM Short Term Goal #5 Start Date  12/20/15  Interventions for Short Term Goal #5  RN sent patient educational material on maintaining her health. RN will send educational on Diabetic retinopathy. RN will follow up with discussion and teach back . RN is assisting patinet to get the items needed to maintain good healt EX. (suppose hose)      Assessment: Patient blood sugar is a little elevated fasting Patient ordered support hose but guessed at size Patient will continue  benefit from [place] telephonic outreach for education and support for  diabetes self management.   Plan:  RN discussed ordering the appropriate size to support hose RN sent educational material on How to use compression hose RN sent a tape measure and patient will call RN once received to discuss measuring appropriately Rn discussed maintaining diet over the Holidays RN will follow up with in the month of December  Kathleen [name] [facility] [phone]

## 2016-03-17 NOTE — Telephone Encounter (Signed)
Was supposed to increase to 87 units and should have 1 more refill.  Will defer to Dr. Dareen Piano to refill.

## 2016-03-23 ENCOUNTER — Encounter: Payer: Self-pay | Admitting: *Deleted

## 2016-03-23 ENCOUNTER — Telehealth: Payer: Self-pay | Admitting: Internal Medicine

## 2016-03-23 NOTE — Telephone Encounter (Signed)
APT. REMINDER CALL, LMTCB °

## 2016-03-24 ENCOUNTER — Ambulatory Visit (INDEPENDENT_AMBULATORY_CARE_PROVIDER_SITE_OTHER): Payer: Commercial Managed Care - HMO | Admitting: Internal Medicine

## 2016-03-24 ENCOUNTER — Encounter: Payer: Self-pay | Admitting: Internal Medicine

## 2016-03-24 VITALS — BP 124/39 | HR 50 | Temp 97.5°F | Ht 60.0 in | Wt 283.0 lb

## 2016-03-24 DIAGNOSIS — I5032 Chronic diastolic (congestive) heart failure: Secondary | ICD-10-CM | POA: Diagnosis not present

## 2016-03-24 DIAGNOSIS — Z23 Encounter for immunization: Secondary | ICD-10-CM | POA: Diagnosis not present

## 2016-03-24 DIAGNOSIS — N183 Chronic kidney disease, stage 3 unspecified: Secondary | ICD-10-CM

## 2016-03-24 DIAGNOSIS — I10 Essential (primary) hypertension: Secondary | ICD-10-CM | POA: Diagnosis not present

## 2016-03-24 DIAGNOSIS — Z79899 Other long term (current) drug therapy: Secondary | ICD-10-CM

## 2016-03-24 DIAGNOSIS — I130 Hypertensive heart and chronic kidney disease with heart failure and stage 1 through stage 4 chronic kidney disease, or unspecified chronic kidney disease: Secondary | ICD-10-CM | POA: Diagnosis not present

## 2016-03-24 DIAGNOSIS — Z0000 Encounter for general adult medical examination without abnormal findings: Secondary | ICD-10-CM

## 2016-03-24 DIAGNOSIS — E1122 Type 2 diabetes mellitus with diabetic chronic kidney disease: Secondary | ICD-10-CM | POA: Diagnosis not present

## 2016-03-24 DIAGNOSIS — Z794 Long term (current) use of insulin: Secondary | ICD-10-CM | POA: Diagnosis not present

## 2016-03-24 DIAGNOSIS — E1169 Type 2 diabetes mellitus with other specified complication: Secondary | ICD-10-CM

## 2016-03-24 LAB — GLUCOSE, CAPILLARY: GLUCOSE-CAPILLARY: 142 mg/dL — AB (ref 65–99)

## 2016-03-24 LAB — POCT GLYCOSYLATED HEMOGLOBIN (HGB A1C): HEMOGLOBIN A1C: 7.7

## 2016-03-24 MED ORDER — GABAPENTIN 300 MG PO CAPS: ORAL_CAPSULE | ORAL | 2 refills | Status: DC

## 2016-03-24 MED ORDER — BUMETANIDE 1 MG PO TABS: 1.0000 mg | ORAL_TABLET | Freq: Three times a day (TID) | ORAL | 3 refills | Status: DC

## 2016-03-24 NOTE — Assessment & Plan Note (Signed)
Lab Results  Component Value Date   HGBA1C 7.7 03/24/2016   HGBA1C 7.9 12/10/2015   HGBA1C 7.3 07/02/2015     Assessment: Diabetes control:  fair Progress toward A1C goal:   improved Comments: patient is compliant with metformin and levemir. Blood sugars within range 90% of time and average around 130  Plan: Medications:  continue current medications Home glucose monitoring: Frequency:   Timing:   Instruction/counseling given: reminded to bring blood glucose meter & log to each visit and discussed the need for weight loss Educational resources provided:   Self management tools provided:   Other plans: Diabetic foot exam to be done today. Will check BMP. May need to decrease metformin dose if creatinine worsens

## 2016-03-24 NOTE — Assessment & Plan Note (Signed)
-   Patient's weight remains stable today - LE edema has improved and is only trace at this time - Will continue with bumex 1 mg tid for now - Patient to follow up with cardiology in 1-2 weeks - Will check BMP today

## 2016-03-24 NOTE — Progress Notes (Signed)
   Subjective:    Patient ID: Kathleen [name], female    DOB: [birth year], 65 y.o.   MRN: [id number]  HPI  Patient seen and examined. She is here for routine follow up of her chronic diastolic HF as well as her DM. She is compliant with her medications. She does complain of a cough over the last 3 weeks productive of yellowish sputum and assoc hoarseness of voice but this has improved over the last 3-4 days. No hemoptysis. Her blood sugars are well controlled and she is watching her diet and exercising daily. She has noted that her leg swelling has improved on increased dose of bumex and is scheduled to follow up with her cardiologist next month.  Review of Systems  Constitutional: Negative.   HENT: Positive for voice change. Negative for congestion, postnasal drip, rhinorrhea, sinus pain and sinus pressure.   Eyes: Negative.   Respiratory: Positive for cough.   Cardiovascular: Positive for leg swelling. Negative for chest pain.  Gastrointestinal: Negative.   Musculoskeletal: Negative.   Skin: Negative.   Neurological: Negative.   Psychiatric/Behavioral: Negative.        Objective:   Physical Exam  Constitutional: She is oriented to person, place, and time. She appears well-developed and well-nourished. No distress.  HENT:  Head: Normocephalic and atraumatic.  Mouth/Throat: No oropharyngeal exudate.  Neck: Normal range of motion. Neck supple.  Cardiovascular: Normal rate, regular rhythm and normal heart sounds.   Pulmonary/Chest: Effort normal and breath sounds normal. No respiratory distress. She has no wheezes.  Abdominal: Soft. Bowel sounds are normal. She exhibits no distension. There is no tenderness.  Musculoskeletal: Normal range of motion. She exhibits edema.  Trace b/l LE edema +  Lymphadenopathy:    She has no cervical adenopathy.  Neurological: She is alert and oriented to person, place, and time.  Skin: Skin is warm. No rash noted. No erythema.  Psychiatric: She has  a normal mood and affect. Her behavior is normal.          Assessment & Plan:  Please see problem based charting for assessment and plan:

## 2016-03-24 NOTE — Patient Instructions (Signed)
-   It was a pleasure seeing you today - We will stop the bentyl for now - Your blood sugars look good. Continue with the current insulin dose for now. We may need to stop or cut down on the metformin depending on how your kidney function looks - Please continue to diet and exercise - Have a great holiday season !!

## 2016-03-24 NOTE — Assessment & Plan Note (Signed)
Flu shot given today

## 2016-03-24 NOTE — Assessment & Plan Note (Signed)
BP Readings from Last 3 Encounters:  03/24/16 (!) 124/39  03/06/16 (!) 124/53  03/04/16 (!) 111/44    Lab Results  Component Value Date   NA 143 02/21/2016   K 4.2 02/21/2016   CREATININE 1.61 (H) 02/21/2016    Assessment: Blood pressure control:  well controlled Progress toward BP goal:   at goal Comments: manual BP done by RN was 126/64. Patient is compliant with losartan  Plan: Medications:  continue current medications Educational resources provided:   Self management tools provided:   Other plans: will check BMP today

## 2016-03-24 NOTE — Assessment & Plan Note (Signed)
-   Creatinine has been stable between 1.4 - 1.7 - Will recheck BMP today

## 2016-03-25 ENCOUNTER — Telehealth: Payer: Self-pay | Admitting: Internal Medicine

## 2016-03-25 LAB — BMP8+ANION GAP
ANION GAP: 20 mmol/L — AB (ref 10.0–18.0)
BUN/Creatinine Ratio: 36 — ABNORMAL HIGH (ref 12–28)
BUN: 75 mg/dL — ABNORMAL HIGH (ref 8–27)
CALCIUM: 9.2 mg/dL (ref 8.7–10.3)
CO2: 23 mmol/L (ref 18–29)
CREATININE: 2.08 mg/dL — AB (ref 0.57–1.00)
Chloride: 98 mmol/L (ref 96–106)
GFR calc Af Amer: 28 mL/min/{1.73_m2} — ABNORMAL LOW (ref >59)
GFR calc non Af Amer: 25 mL/min/{1.73_m2} — ABNORMAL LOW (ref >59)
Glucose: 150 mg/dL — ABNORMAL HIGH (ref 65–99)
POTASSIUM: 4.7 mmol/L (ref 3.5–5.2)
SODIUM: 141 mmol/L (ref 134–144)

## 2016-03-25 NOTE — Telephone Encounter (Signed)
Called patient to discuss lab work with her. BMP shows worsening creatinine up to 2 now. Baseline approx 1.4 - 1.6. Likely secondary to increased bumex dose. Advised patient to hold losartan (BP was borderline low on last visit) and to reduce bumex to 1 mg bid from tid and have her follow up on Monday December 4th for repeat blood work, recheck BP and to assess how she is doing on reduced diuretic dose.

## 2016-03-30 ENCOUNTER — Encounter: Payer: Self-pay | Admitting: Internal Medicine

## 2016-03-30 ENCOUNTER — Ambulatory Visit (INDEPENDENT_AMBULATORY_CARE_PROVIDER_SITE_OTHER): Payer: Commercial Managed Care - HMO | Admitting: Internal Medicine

## 2016-03-30 VITALS — BP 117/52 | HR 52 | Temp 97.9°F | Ht 60.0 in | Wt 281.0 lb

## 2016-03-30 DIAGNOSIS — N183 Chronic kidney disease, stage 3 unspecified: Secondary | ICD-10-CM

## 2016-03-30 DIAGNOSIS — I5032 Chronic diastolic (congestive) heart failure: Secondary | ICD-10-CM

## 2016-03-30 DIAGNOSIS — Z79899 Other long term (current) drug therapy: Secondary | ICD-10-CM | POA: Diagnosis not present

## 2016-03-30 DIAGNOSIS — I110 Hypertensive heart disease with heart failure: Secondary | ICD-10-CM

## 2016-03-30 DIAGNOSIS — I10 Essential (primary) hypertension: Secondary | ICD-10-CM

## 2016-03-30 DIAGNOSIS — I130 Hypertensive heart and chronic kidney disease with heart failure and stage 1 through stage 4 chronic kidney disease, or unspecified chronic kidney disease: Secondary | ICD-10-CM | POA: Diagnosis not present

## 2016-03-30 MED ORDER — BUMETANIDE 1 MG PO TABS: 1.0000 mg | ORAL_TABLET | Freq: Two times a day (BID) | ORAL | 3 refills | Status: DC

## 2016-03-30 NOTE — Patient Instructions (Signed)
It was a pleasure to meet you Kathleen [name].  We will stop the Losartan and continue the Bumex at twice a day as you have been taking it.  We will recheck blood work to assess your kidney function.  Please follow up with Cardiology as scheduled. Please let us know if you begin to have worsening shortness of breath, swelling, or increase in weight of 3 lbs in one day or 5 lbs in one week.

## 2016-03-30 NOTE — Assessment & Plan Note (Signed)
She decreased her Bumex 1 mg to twice daily from TID after her Creatinine was elevated to 2.08 from baseline of 1.4-1.6. She reports no worsening of her baseline SOB with daily activities or increased lower extremity or abdominal swelling. She is having good urine output and continues to check her weight at home which is down 2 lbs since last visit. She feels somewhat better than usual the last few days.   Patient feels well on reduced Bumex dosing with stable weights.  -Continue Bumex 1 mg BID -Recheck BMET today -f/u with Cardiology on 04/06/16

## 2016-03-30 NOTE — Assessment & Plan Note (Signed)
Patient with baseline creatinine 1.4 - 1.6. She was seen in clinic on 11/28 at which time a BMET showed increased creatinine to 2.08. She was instructed to hold her home Losartan and decrease her Bumex 1 mg to BID from TID. She reports making these changes without issue.  Will recheck BMET today and continue Bumex twice daily while holding Losartan.

## 2016-03-30 NOTE — Progress Notes (Signed)
   CC: dCHF  HPI:  [name] is a 65 y.o. female with PMH as listed below who presents for follow up management of her dCHF, HTN, and CKD.  CKD 3: Patient with baseline creatinine 1.4 - 1.6. She was seen in clinic on 11/28 at which time a BMET showed increased creatinine to 2.08. She was instructed to hold her home Losartan and decrease her Bumex 1 mg to BID from TID. She reports making these changes without issue.  Chronic diastolic CHF: She decreased her Bumex 1 mg to twice daily from TID after her Creatinine was elevated to 2.08 from baseline of 1.4-1.6. She reports no worsening of her baseline SOB with daily activities or increased lower extremity or abdominal swelling. She is having good urine output and continues to check her weight at home which is down 2 lbs since last visit. She feels somewhat better than usual the last few days.   HTN: Losartan 50 mg daily was held after creatinine was increased above baseline and for borderline low BP. Her BP today is 117/52. She denies any lightheadedness, dizziness, or loss of consciousness.       Past Medical History:  Diagnosis Date  . Chronic diastolic (congestive) heart failure 01/10/2015  . Chronic diastolic CHF (congestive heart failure) ([place])   . Degenerative joint disease of hand   . Diabetes mellitus   . Dyslipidemia   . Fecal occult blood test positive   . GERD (gastroesophageal reflux disease)   . Headache   . Hypertension   . Inadequate material resources   . Irritable bowel syndrome   . Obesity   . Post-menopausal bleeding   . Postmenopausal   . Shortness of breath dyspnea    multifactorial from obesity, deconditioning, obesity hypoventilation syndrome    Review of Systems:   Review of Systems  Respiratory: Negative for shortness of breath.   Cardiovascular: Negative for chest pain, palpitations and orthopnea.       Chronic, mild swelling b/l LEs  Genitourinary: Negative for dysuria.  Musculoskeletal:  Negative for falls.  Neurological: Negative for dizziness and loss of consciousness.     Physical Exam:  Vitals:   03/30/16 0847  BP: (!) 117/52  Pulse: (!) 52  Temp: 97.9 F (36.6 C)  TempSrc: Oral  SpO2: 95%  Weight: 281 lb (127.5 kg)  Height: 5' (1.524 m)   Physical Exam  Constitutional: She is oriented to person, place, and time. She appears well-developed and well-nourished.  Cardiovascular:  Bradycardic, 2/6 systolic murmur heard best at RUS border  Pulmonary/Chest: Effort normal. No respiratory distress. She has no wheezes. She has no rales.  Musculoskeletal: She exhibits no tenderness.  Trace edema b/l lower extremities  Neurological: She is alert and oriented to person, place, and time.    Assessment & Plan:   See Encounters Tab for problem based charting.  Patient discussed with Dr. Lynnae January

## 2016-03-30 NOTE — Assessment & Plan Note (Signed)
BP Readings from Last 3 Encounters:  03/30/16 (!) 117/52  03/24/16 (!) 124/39  03/06/16 (!) 124/53    Losartan 50 mg daily was held after creatinine was increased above baseline and for borderline low BP. Her BP today is 117/52. She denies any lightheadedness, dizziness, or loss of consciousness.  BP still low normal off of Losartan. Will not resume Losartan at this time, although may need to consider as she is diabetic with prior microalbuminemia. Will recheck BMET today.

## 2016-03-31 LAB — BMP8+ANION GAP
Anion Gap: 16 mmol/L (ref 10.0–18.0)
BUN / CREAT RATIO: 39 — AB (ref 12–28)
BUN: 56 mg/dL — AB (ref 8–27)
CHLORIDE: 104 mmol/L (ref 96–106)
CO2: 26 mmol/L (ref 18–29)
Calcium: 9.3 mg/dL (ref 8.7–10.3)
Creatinine, Ser: 1.45 mg/dL — ABNORMAL HIGH (ref 0.57–1.00)
GFR calc non Af Amer: 38 mL/min/{1.73_m2} — ABNORMAL LOW (ref >59)
GFR, EST AFRICAN AMERICAN: 44 mL/min/{1.73_m2} — AB (ref >59)
GLUCOSE: 148 mg/dL — AB (ref 65–99)
POTASSIUM: 4.6 mmol/L (ref 3.5–5.2)
Sodium: 146 mmol/L — ABNORMAL HIGH (ref 134–144)

## 2016-03-31 NOTE — Progress Notes (Signed)
Internal Medicine Clinic Attending  Case discussed with Dr. Patel,Vishal at the time of the visit.  We reviewed the resident's history and exam and pertinent patient test results.  I agree with the assessment, diagnosis, and plan of care documented in the resident's note.  

## 2016-04-01 ENCOUNTER — Encounter: Payer: Self-pay | Admitting: Cardiology

## 2016-04-06 ENCOUNTER — Encounter (INDEPENDENT_AMBULATORY_CARE_PROVIDER_SITE_OTHER): Payer: Self-pay

## 2016-04-06 ENCOUNTER — Encounter: Payer: Self-pay | Admitting: Cardiology

## 2016-04-06 ENCOUNTER — Ambulatory Visit (INDEPENDENT_AMBULATORY_CARE_PROVIDER_SITE_OTHER): Payer: Commercial Managed Care - HMO | Admitting: Cardiology

## 2016-04-06 VITALS — BP 122/54 | HR 50 | Ht 60.0 in | Wt 276.0 lb

## 2016-04-06 DIAGNOSIS — I4892 Unspecified atrial flutter: Secondary | ICD-10-CM

## 2016-04-06 DIAGNOSIS — I441 Atrioventricular block, second degree: Secondary | ICD-10-CM | POA: Diagnosis not present

## 2016-04-06 DIAGNOSIS — I5032 Chronic diastolic (congestive) heart failure: Secondary | ICD-10-CM

## 2016-04-06 DIAGNOSIS — I10 Essential (primary) hypertension: Secondary | ICD-10-CM | POA: Diagnosis not present

## 2016-04-06 DIAGNOSIS — R001 Bradycardia, unspecified: Secondary | ICD-10-CM | POA: Diagnosis not present

## 2016-04-06 HISTORY — DX: Unspecified atrial flutter: I48.92

## 2016-04-06 HISTORY — DX: Bradycardia, unspecified: R00.1

## 2016-04-06 LAB — CBC WITH DIFFERENTIAL/PLATELET
Basophils Absolute: 0 {cells}/uL (ref 0–200)
Basophils Relative: 0 %
Eosinophils Absolute: 282 {cells}/uL (ref 15–500)
Eosinophils Relative: 3 %
HCT: 40.4 % (ref 35.0–45.0)
Hemoglobin: 12.7 g/dL (ref 11.7–15.5)
Lymphocytes Relative: 29 %
Lymphs Abs: 2726 {cells}/uL (ref 850–3900)
MCH: 27.3 pg (ref 27.0–33.0)
MCHC: 31.4 g/dL — ABNORMAL LOW (ref 32.0–36.0)
MCV: 86.7 fL (ref 80.0–100.0)
Monocytes Absolute: 752 {cells}/uL (ref 200–950)
Monocytes Relative: 8 %
Neutro Abs: 5640 {cells}/uL (ref 1500–7800)
Neutrophils Relative %: 60 %
Platelets: 182 K/uL (ref 140–400)
RBC: 4.66 MIL/uL (ref 3.80–5.10)
RDW: 15.1 % — ABNORMAL HIGH (ref 11.0–15.0)
WBC: 9.4 K/uL (ref 3.8–10.8)

## 2016-04-06 MED ORDER — APIXABAN 5 MG PO TABS: 5.0000 mg | ORAL_TABLET | Freq: Two times a day (BID) | ORAL | 11 refills | Status: DC

## 2016-04-06 MED ORDER — APIXABAN 2.5 MG PO TABS: 5.0000 mg | ORAL_TABLET | Freq: Two times a day (BID) | ORAL | Status: DC

## 2016-04-06 NOTE — Telephone Encounter (Signed)
Patient was seen by Dr. Radford Pax today. See note.

## 2016-04-06 NOTE — Patient Instructions (Signed)
Medication Instructions:  1) STOP ASPIRIN 2) START ELIQUIS 5 mg twice daily  Labwork: TODAY: BMET, CBC  Testing/Procedures: None  Follow-Up: You have been referred to the [facility].  Your physician wants you to follow-up in: 6 months with Dr. Radford Pax. You will receive a reminder letter in the mail two months in advance. If you don't receive a letter, please call our office to schedule the follow-up appointment.   Any Other Special Instructions Will Be Listed Below (If Applicable).     If you need a refill on your cardiac medications before your next appointment, please call your pharmacy.

## 2016-04-06 NOTE — Progress Notes (Signed)
Cardiology Office Note    Date:  04/06/2016   ID:  EDELYN HEIDEL, DOB [birth year], MRN [id number]  PCP:  Aldine Contes, MD  Cardiologist:  Fransico Him, MD   Chief Complaint  Patient presents with  . Atrial Fibrillation  . Congestive Heart Failure  . Hypertension    History of Present Illness:  Kathleen [name] is a 65 y.o. female  with a history of HTN, morbid obesity, type 2 DM, IBS, dyslipidemia, Obesity hypoventilation syndrome (on home O2 with no OSA by PSG but showed nocturnal hypoxemia), chronic abdominal pain , chronic diastolic CHF, Wenkebach AV block.  She denies any chest pain,  palpitations, or syncope.  She denies any SOB or DOE and is only using her O2 at night and when she cleans house. She has chronic LE edema and her PCP increased her Bumex which has helped.  She has CKD stage 3 that her PCP is following.  She says that she has felt dizzy intermittently and fatigued but syncope or pre syncope.  Past Medical History:  Diagnosis Date  . Atrial flutter ([place]) 04/06/2016  . Bradycardia 04/06/2016   type 1 second degree AV block  . Chronic diastolic (congestive) heart failure 01/10/2015  . Degenerative joint disease of hand   . Diabetes mellitus   . Dyslipidemia   . Fecal occult blood test positive   . GERD (gastroesophageal reflux disease)   . Headache   . Hypertension   . Inadequate material resources   . Irritable bowel syndrome   . Obesity   . Post-menopausal bleeding   . Postmenopausal   . Shortness of breath dyspnea    multifactorial from obesity, deconditioning, obesity hypoventilation syndrome    Past Surgical History:  Procedure Laterality Date  . CHOLECYSTECTOMY    . EYE SURGERY     left lens implant s/p cataracts  . OTHER SURGICAL HISTORY     right shoulder tendon repair 05/2011    Current Medications: Outpatient Medications Prior to Visit  Medication Sig Dispense Refill  . ACCU-CHEK FASTCLIX LANCETS MISC TEST BLOOD SUGAR THREE TIMES DAILY  306 each 1  . ACCU-CHEK SMARTVIEW test strip TEST BLOOD SUGAR THREE TIMES DAILY 300 each 1  . amitriptyline (ELAVIL) 50 MG tablet Take 0.5 tablets (25 mg total) by mouth at bedtime. Reported on 04/17/2015 30 tablet 2  . aspirin 81 MG tablet Take 81 mg by mouth daily.    . Blood Glucose Monitoring Suppl (ACCU-CHEK NANO SMARTVIEW) W/DEVICE KIT Check blood sugar 3 times daily 1 kit 0  . bumetanide (BUMEX) 1 MG tablet Take 1 tablet (1 mg total) by mouth 2 (two) times daily. 90 tablet 3  . gabapentin (NEURONTIN) 300 MG capsule TAKE 1 CAPSULE THREE TIMES DAILY 270 capsule 2  . insulin detemir (LEVEMIR) 100 UNIT/ML injection Inject 0.87 mLs (87 Units total) into the skin at bedtime. 30 mL 5  . Insulin Syringe-Needle U-100 31G X 15/64" 1 ML MISC Use to inject insulin one time a day 100 each 5  . metFORMIN (GLUCOPHAGE) 1000 MG tablet TAKE 1 TABLET (1,000 MG TOTAL) BY MOUTH 2 (TWO) TIMES DAILY WITH A MEAL. 180 tablet 3  . Multiple Vitamins-Minerals (HEALTHY EYES PO) Take by mouth daily. Reported on 05/17/2015    . NON FORMULARY Place 2 L into the nose daily. Wears with exertion and qhs    . omeprazole (PRILOSEC) 20 MG capsule TAKE 1 CAPSULE (20 MG TOTAL) BY MOUTH DAILY. 90 capsule 1   No  facility-administered medications prior to visit.      Allergies:   Doxycycline   Social History   Social History  . Marital status: Divorced    Spouse name: N/A  . Number of children: 2  . Years of education: N/A   Occupational History  . unemployed Unemployed   Social History Main Topics  . Smoking status: Never Smoker  . Smokeless tobacco: Never Used  . Alcohol use No  . Drug use: No  . Sexual activity: Not Currently   Other Topics Concern  . None   Social History Narrative   Single.   Divorced x2.   Has 2 children, by two different fathers.   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 May apply for disability/SSI given her  pain in her hands which has made interviewing for jobs difficult.   Never Smoked   Alcohol use-no   Drug use-no   Regular exercise-yes      Financial assistance approved for 100% discount at [facility] and has [facility] card per [employer]   02/18/2010              Family History:  The patient's family history includes Diabetes in her mother and sister; Obesity in her mother; Stroke in her father.   ROS:   Please see the history of present illness.    ROS All other systems reviewed and are negative.  No flowsheet data found.     PHYSICAL EXAM:   VS:  BP (!) 122/54   Pulse (!) 50   Ht 5' (1.524 m)   Wt 276 lb (125.2 kg)   BMI 53.90 kg/m    GEN: Well nourished, well developed, in no acute distress  HEENT: normal  Neck: no JVD, carotid bruits, or masses Cardiac: RRR; no murmurs, rubs, or gallops.  Intact distal pulses bilaterally. Trace LE edema Respiratory:  clear to auscultation bilaterally, normal work of breathing GI: soft, nontender, nondistended, + BS MS: no deformity or atrophy  Skin: warm and dry, no rash Neuro:  Alert and Oriented x 3, Strength and sensation are intact Psych: euthymic mood, full affect  Wt Readings from Last 3 Encounters:  04/06/16 276 lb (125.2 kg)  03/30/16 281 lb (127.5 kg)  03/24/16 283 lb (128.4 kg)      Studies/Labs Reviewed:   EKG:  EKG is ordered today and showed atrial flutter with SVR at 48bpm with LAD  Recent Labs: 02/14/2016: ALT 11 03/30/2016: BUN 56; Creatinine, Ser 1.45; Potassium 4.6; Sodium 146   Lipid Panel    Component Value Date/Time   CHOL 134 07/02/2015 1215   TRIG 78 07/02/2015 1215   HDL 39 (L) 07/02/2015 1215   CHOLHDL 3.4 07/02/2015 1215   CHOLHDL 3.5 06/07/2014 1027   VLDL 16 06/07/2014 1027   LDLCALC 79 07/02/2015 1215    Additional studies/ records that were reviewed today include:  none    ASSESSMENT:    1. Chronic diastolic congestive heart failure ([place])   2. Essential hypertension   3. Mobitz  type 1 second degree atrioventricular block   4. Bradycardia   5. Atrial flutter, unspecified type ([place])      PLAN:  In order of problems listed above:  1. Chronic diastolic CHF - appears euvolemic on exam today and weight is stable.  Continue Bumex. 2. HTN - BP controlled on current meds. 3. Mobitz Type I second degree AV block - asymptomatic  4. Bradycardia - heart monitor in June showed bradycardia with HR as low as 40bpm and ETT was ordered to assess for chronotropic competence but we could never get in touch with patient.  I will set her up for ETT now.  5. New onset atrial flutter with SVR.  Her CHAD2VASC score is 3.  Will start Eliquis 62m BID and check NOAC panel.  Refer to afib clinic.  She will need DCCV once she has completed 4 weeks of Eliquis.  If she is significantly bradycardic after cardioversion she may need PPM.    Medication Adjustments/Labs and Tests Ordered: Current medicines are reviewed at length with the patient today.  Concerns regarding medicines are outlined above.  Medication changes, Labs and Tests ordered today are listed in the Patient Instructions below.  There are no Patient Instructions on file for this visit.   Signed, TFransico Him MD  04/06/2016 1:59 PM    [facility] [address]  [id number]: [phone] Fax: [phone]

## 2016-04-07 ENCOUNTER — Telehealth (HOSPITAL_COMMUNITY): Payer: Self-pay | Admitting: Cardiology

## 2016-04-07 ENCOUNTER — Telehealth: Payer: Self-pay | Admitting: Cardiology

## 2016-04-07 ENCOUNTER — Encounter (INDEPENDENT_AMBULATORY_CARE_PROVIDER_SITE_OTHER): Payer: Commercial Managed Care - HMO | Admitting: Ophthalmology

## 2016-04-07 ENCOUNTER — Telehealth: Payer: Self-pay

## 2016-04-07 DIAGNOSIS — I10 Essential (primary) hypertension: Secondary | ICD-10-CM | POA: Diagnosis not present

## 2016-04-07 DIAGNOSIS — H43813 Vitreous degeneration, bilateral: Secondary | ICD-10-CM

## 2016-04-07 DIAGNOSIS — H35033 Hypertensive retinopathy, bilateral: Secondary | ICD-10-CM

## 2016-04-07 DIAGNOSIS — E113313 Type 2 diabetes mellitus with moderate nonproliferative diabetic retinopathy with macular edema, bilateral: Secondary | ICD-10-CM | POA: Diagnosis not present

## 2016-04-07 DIAGNOSIS — H33302 Unspecified retinal break, left eye: Secondary | ICD-10-CM | POA: Diagnosis not present

## 2016-04-07 DIAGNOSIS — E11311 Type 2 diabetes mellitus with unspecified diabetic retinopathy with macular edema: Secondary | ICD-10-CM | POA: Diagnosis not present

## 2016-04-07 LAB — BASIC METABOLIC PANEL
BUN: 46 mg/dL — ABNORMAL HIGH (ref 7–25)
CHLORIDE: 101 mmol/L (ref 98–110)
CO2: 32 mmol/L — ABNORMAL HIGH (ref 20–31)
CREATININE: 1.6 mg/dL — AB (ref 0.50–0.99)
Calcium: 9.9 mg/dL (ref 8.6–10.4)
Glucose, Bld: 107 mg/dL — ABNORMAL HIGH (ref 65–99)
POTASSIUM: 4.8 mmol/L (ref 3.5–5.3)
Sodium: 142 mmol/L (ref 135–146)

## 2016-04-07 NOTE — Telephone Encounter (Signed)
Left message for patient that Dr. Radford Pax is aware of her medical history and wants her to take the medication in question. Informed her she will be called tomorrow to discuss further.

## 2016-04-07 NOTE — Telephone Encounter (Signed)
Called pt 1) she states dr Dareen Piano called her today and lm "something about labs", spoke to dr Dareen Piano he did not call, will speak to dr patel, per dr Dareen Piano reassured her about lab studies 2) she ask if she should take the eliquis that dr turner prescribed for her, she states the pharmacist told her she shouldn't because of her kidney disease, called dr turner's office for pt, they plan to call her after they finish seeing pt's today, reassured pt that she would hear from dr turner's office.

## 2016-04-07 NOTE — Telephone Encounter (Signed)
Follow Up:     Pt calling again,very anxious. I  Told her she would receive a call at the end of the day.

## 2016-04-07 NOTE — Telephone Encounter (Signed)
Pt called and asked if she could take Eliquis with chronic renal disease. Her SCr runs 1.5-2.0.  I told the pt she could take Eliquis 5 mg BID unless her SCr is > 2.5 (Up to Date).  Kerin Ransom PA-C 04/07/2016 5:54 PM

## 2016-04-07 NOTE — Telephone Encounter (Signed)
New message      Pt c/o medication issue:  1. Name of Medication: eliquis  2. How are you currently taking this medication (dosage and times per day)? 5mg  daily 3. Are you having a reaction (difficulty breathing--STAT)? no 4. What is your medication issue? Pt was seen yesterday.  She forgot to tell Dr Radford Pax that she has chronic kidney disease.  It is still ok to take the eliquis with kidney dz?  She will not pick it up at the pharmacy until she hears back from Dr Radford Pax.  Please call

## 2016-04-07 NOTE — Telephone Encounter (Signed)
Needs to speak with a nurse regarding meds and lab result.

## 2016-04-08 NOTE — Telephone Encounter (Signed)
Left message to call back  

## 2016-04-09 NOTE — Telephone Encounter (Signed)
Left message to call back  

## 2016-04-09 NOTE — Telephone Encounter (Signed)
Informed patient that per Dr. Radford Pax, she was started on Eliquis over Xarelto because her kidneys could tolerate it better.  She states she started the medication the day after it was prescribed. Instructed patient to monitor for signs/symptoms of bleeding. She was grateful for call.

## 2016-04-13 ENCOUNTER — Other Ambulatory Visit: Payer: Self-pay | Admitting: *Deleted

## 2016-04-13 ENCOUNTER — Encounter: Payer: Self-pay | Admitting: *Deleted

## 2016-04-13 NOTE — Patient Outreach (Signed)
[facility]) Care Management  04/13/2016   DATRA CLARY [birth year] [id number]  Subjective: RN Health Coach telephone call to patient.  [employer] compliance verified.Per patient her blood sugar today fasting was 133 fasting. Patient has lost 2 pounds. Patient has stopped taking her aspirin and has started on Eliquis. Patient is exercising 5 x a week.  Per patient she is feeling so much better. Patient still has slight swelling in ankles. Patient A1C is 7.7 down by.2 since last blood drawn. Patient states she is feeling the best she has in a while. Per patient she feels like she has more strength and energy. Patient is trying to get compression hose . She is not liking the type with the opening made in the foot.Patient ambulates with cane and is using her oxygen at night. Patient has agreed to follow up outreach calls.   Objective:   Current Medications:  Current Outpatient Prescriptions  Medication Sig Dispense Refill  . ACCU-CHEK FASTCLIX LANCETS MISC TEST BLOOD SUGAR THREE TIMES DAILY 306 each 1  . ACCU-CHEK SMARTVIEW test strip TEST BLOOD SUGAR THREE TIMES DAILY 300 each 1  . amitriptyline (ELAVIL) 50 MG tablet Take 0.5 tablets (25 mg total) by mouth at bedtime. Reported on 04/17/2015 30 tablet 2  . apixaban (ELIQUIS) 5 MG TABS tablet Take 1 tablet (5 mg total) by mouth 2 (two) times daily. 60 tablet 11  . Blood Glucose Monitoring Suppl (ACCU-CHEK NANO SMARTVIEW) W/DEVICE KIT Check blood sugar 3 times daily 1 kit 0  . bumetanide (BUMEX) 1 MG tablet Take 1 tablet (1 mg total) by mouth 2 (two) times daily. 90 tablet 3  . gabapentin (NEURONTIN) 300 MG capsule TAKE 1 CAPSULE THREE TIMES DAILY 270 capsule 2  . insulin detemir (LEVEMIR) 100 UNIT/ML injection Inject 0.87 mLs (87 Units total) into the skin at bedtime. 30 mL 5  . Insulin Syringe-Needle U-100 31G X 15/64" 1 ML MISC Use to inject insulin one time a day 100 each 5  . metFORMIN (GLUCOPHAGE) 1000 MG tablet TAKE 1 TABLET  (1,000 MG TOTAL) BY MOUTH 2 (TWO) TIMES DAILY WITH A MEAL. 180 tablet 3  . Multiple Vitamins-Minerals (HEALTHY EYES PO) Take by mouth daily. Reported on 05/17/2015    . NON FORMULARY Place 2 L into the nose daily. Wears with exertion and qhs    . omeprazole (PRILOSEC) 20 MG capsule TAKE 1 CAPSULE (20 MG TOTAL) BY MOUTH DAILY. 90 capsule 1   No current facility-administered medications for this visit.     Functional Status:  In your present state of health, do you have any difficulty performing the following activities: 04/13/2016 03/30/2016  Hearing? N N  Vision? Y Y  Difficulty concentrating or making decisions? N N  Walking or climbing stairs? Y Y  Dressing or bathing? N N  Doing errands, shopping? N N  Preparing Food and eating ? N -  Using the Toilet? N -  In the past six months, have you accidently leaked urine? N -  Do you have problems with loss of bowel control? N -  Managing your Medications? N -  Managing your Finances? N -  Housekeeping or managing your Housekeeping? N -  Some recent data might be hidden    Fall/Depression Screening: PHQ 2/9 Scores 04/13/2016 03/30/2016 03/16/2016 03/06/2016 02/21/2016 02/19/2016 02/14/2016  PHQ - 2 Score 0 0 0 0 0 0 0   THN CM Care Plan Problem One   Flowsheet Row Most Recent Value  Care Plan  Problem One  Knowledge deficit in self management of diabetes  Role Documenting the Problem One  [place] for Problem One  Active  THN Long Term Goal (31-90 days)  Patient will have a decrease of HGB A1C by one point in 90 days  Interventions for Problem One [place] discussed with patient what the  A1C is.  RN discussed how A1C is affected by the blood sugars.  RN sent EMMI information on  Why get A1C checked. RN sent educational material on Know your goal numbers. RN sent educational material on How the A1C helps.RN discussed possible reasons why patient A1C went up. RN Health Coach will follow up within 30  days for  discussion and teach back.  THN CM Short Term Goal #1 (0-30 days)  patient will be able to verbalize obtaining appropiate fitting compression hose within the next 30 days  THN CM Short Term Goal #1 Start Date  04/13/16  Interventions for Short Term Goal #1  RN discussed with patient about getting the correct size compression hose. RN sent patient educational material on compression hose, How to order correct size and how to put them on. RN wil follow up with next discussion to see if patient obtained.  THN CM Short Term Goal #2 (0-30 days)  Patient will be able to verbalize an action plan for high and low blood sugar within 30 days  THN CM Short Term Goal #2 Met Date  04/13/16  Interventions for Short Term Goal #2  RN reviewed patient action plan  and discussed additional action that patient should be takening. RN talked about additional fluids and food patient could take on her sick days also. RN sent EMMI educational material on self care.  RN  follows up with teach back  THN CM Short Term Goal #3 (0-30 days)  Patient will be able to verbalize foods high and low in carbohydrates within 30 days  Interventions for Short Tern Goal #3  RN sent patient a picture chart on carbohydrates. RN sent EMMI information on counting carbohydrates. RN sent educational material on creating a healthy meal. RN will follow up within a month. RN sent patient a list of healthy snacks. RN will  reiterate eating a snack at bedtime. RN discuss with patient about looking at the snacks and not over eating since she has a little weight gain  THN CM Short Term Goal #4 (0-30 days)  Patient will report her blood sugars are under 200 within the next 30 days  Interventions for Short Term Goal #4  Patient will choose a healthier snack at [facility] to help her blood sugar decrease in am. Patient understand she can do exercise at hs to help blood sugars. Patient understands she can drink water to keep hydrated.  THN CM Short Term Goal  #5 (0-30 days)  Patient will be able  to verbalize  what she needs to do to Maintaining her overall health within 30 days  Interventions for Short Term Goal #5  RN sent patient educational material on maintaining her health. RN will send educational on Diabetic retinopathy. RN will follow up with discussion and teach back . RN is assisting patinet to get the items needed to maintain good healt EX. (suppose hose)      Assessment:  A1C 7.7 down from 7.9 last blood drawn Patient exercises 5 x week Patient is on Eliquis Patient is documenting CBG and daily weights Patient will continue to  benefit from [employer] telephonic outreach for education and support for diabetes self management.   Plan:  RN discussed maintaining her diet throughout the holidays RN discussed obtaining appropriate compression stockings RN sent educational material on compression stockings RN will follow up within the month of January  Ryelee [facility] Management [phone]

## 2016-04-14 ENCOUNTER — Ambulatory Visit (HOSPITAL_COMMUNITY)
Admission: RE | Admit: 2016-04-14 | Discharge: 2016-04-14 | Disposition: A | Discharge status: Home / Self Care | Payer: Commercial Managed Care - HMO | Source: Ambulatory Visit | Attending: Nurse Practitioner | Admitting: Nurse Practitioner

## 2016-04-14 ENCOUNTER — Encounter (HOSPITAL_COMMUNITY): Payer: Self-pay | Admitting: Nurse Practitioner

## 2016-04-14 VITALS — BP 126/64 | HR 50 | Ht 60.0 in | Wt 276.4 lb

## 2016-04-14 DIAGNOSIS — I5032 Chronic diastolic (congestive) heart failure: Secondary | ICD-10-CM | POA: Insufficient documentation

## 2016-04-14 DIAGNOSIS — I480 Paroxysmal atrial fibrillation: Secondary | ICD-10-CM | POA: Diagnosis not present

## 2016-04-14 DIAGNOSIS — Z6841 Body Mass Index (BMI) 40.0 and over, adult: Secondary | ICD-10-CM | POA: Diagnosis not present

## 2016-04-14 DIAGNOSIS — Z9981 Dependence on supplemental oxygen: Secondary | ICD-10-CM | POA: Diagnosis not present

## 2016-04-14 DIAGNOSIS — K589 Irritable bowel syndrome without diarrhea: Secondary | ICD-10-CM | POA: Diagnosis not present

## 2016-04-14 DIAGNOSIS — I4892 Unspecified atrial flutter: Secondary | ICD-10-CM | POA: Insufficient documentation

## 2016-04-14 DIAGNOSIS — N183 Chronic kidney disease, stage 3 (moderate): Secondary | ICD-10-CM | POA: Insufficient documentation

## 2016-04-14 DIAGNOSIS — I130 Hypertensive heart and chronic kidney disease with heart failure and stage 1 through stage 4 chronic kidney disease, or unspecified chronic kidney disease: Secondary | ICD-10-CM | POA: Diagnosis not present

## 2016-04-14 DIAGNOSIS — E785 Hyperlipidemia, unspecified: Secondary | ICD-10-CM | POA: Insufficient documentation

## 2016-04-14 DIAGNOSIS — R0689 Other abnormalities of breathing: Secondary | ICD-10-CM | POA: Insufficient documentation

## 2016-04-14 DIAGNOSIS — I441 Atrioventricular block, second degree: Secondary | ICD-10-CM | POA: Insufficient documentation

## 2016-04-14 DIAGNOSIS — Z794 Long term (current) use of insulin: Secondary | ICD-10-CM | POA: Insufficient documentation

## 2016-04-14 DIAGNOSIS — E1122 Type 2 diabetes mellitus with diabetic chronic kidney disease: Secondary | ICD-10-CM | POA: Diagnosis not present

## 2016-04-14 DIAGNOSIS — R0902 Hypoxemia: Secondary | ICD-10-CM | POA: Diagnosis not present

## 2016-04-14 NOTE — Progress Notes (Addendum)
Primary Care Physician: Aldine Contes, MD Referring Physician: Dr. Rose Fillers is a 65 y.o. female with a h/o HTN, morbid obesity, type 2 DM, IBS, dyslipidemia, Obesity hypoventilation syndrome (on home O2 with no OSA by PSG but showed nocturnal hypoxemia), chronic abdominal pain , chronic diastolic CHF, Wenkebach AV block.She has chronic LE edema and her PCP increased her Bumex which has helped.  She has CKD stage 3 that her PCP is following.  She is in the afib clinic for evaluation after EKG showed slow atrial flutter, apparently new onset, with Dr. Radford Pax on her visit 04/06/16. The pt was asymptomatic. She also has h/o wenckebach block and slow v rates at 50 bpm. She was started on eliquis 5 mg bid and referred to afib clinic thinking the pt may need cardioversion in several weeks after sufficient time on DOAC. The pt however is in sinus brady today with first degree AV block and v rate of 50. She feels well today. She does not notice any irregular heart beat. No bleeding issues on DOAC. Echo was ordered but has not been scheduled.  Review of lifestyle shows that she does not smoke, no alcohol, rare caffeine. No OSA. She goes to [employer] everyday and has lost weight per pt.She does walk with a cane due to diabetic neuropathy.  Today, she denies symptoms of palpitations, chest pain, shortness of breath, orthopnea, PND,  dizziness, presyncope, syncope, or neurologic sequela.  Positive for chronic LLE.The patient is tolerating medications without difficulties and is otherwise without complaint today.   Past Medical History:  Diagnosis Date  . Atrial flutter ([place]) 04/06/2016  . Bradycardia 04/06/2016   type 1 second degree AV block  . Chronic diastolic (congestive) heart failure 01/10/2015  . Degenerative joint disease of hand   . Diabetes mellitus   . Dyslipidemia   . Fecal occult blood test positive   . GERD (gastroesophageal reflux disease)   . Headache   . Hypertension     . Inadequate material resources   . Irritable bowel syndrome   . Obesity   . Post-menopausal bleeding   . Postmenopausal   . Shortness of breath dyspnea    multifactorial from obesity, deconditioning, obesity hypoventilation syndrome   Past Surgical History:  Procedure Laterality Date  . CHOLECYSTECTOMY    . EYE SURGERY     left lens implant s/p cataracts  . OTHER SURGICAL HISTORY     right shoulder tendon repair 05/2011    Current Outpatient Prescriptions  Medication Sig Dispense Refill  . ACCU-CHEK FASTCLIX LANCETS MISC TEST BLOOD SUGAR THREE TIMES DAILY 306 each 1  . ACCU-CHEK SMARTVIEW test strip TEST BLOOD SUGAR THREE TIMES DAILY 300 each 1  . amitriptyline (ELAVIL) 50 MG tablet Take 0.5 tablets (25 mg total) by mouth at bedtime. Reported on 04/17/2015 30 tablet 2  . apixaban (ELIQUIS) 5 MG TABS tablet Take 1 tablet (5 mg total) by mouth 2 (two) times daily. 60 tablet 11  . Blood Glucose Monitoring Suppl (ACCU-CHEK NANO SMARTVIEW) W/DEVICE KIT Check blood sugar 3 times daily 1 kit 0  . bumetanide (BUMEX) 1 MG tablet Take 1 tablet (1 mg total) by mouth 2 (two) times daily. 90 tablet 3  . gabapentin (NEURONTIN) 300 MG capsule TAKE 1 CAPSULE THREE TIMES DAILY 270 capsule 2  . insulin detemir (LEVEMIR) 100 UNIT/ML injection Inject 0.87 mLs (87 Units total) into the skin at bedtime. 30 mL 5  . Insulin Syringe-Needle U-100 31G X 15/64"  1 ML MISC Use to inject insulin one time a day 100 each 5  . metFORMIN (GLUCOPHAGE) 1000 MG tablet TAKE 1 TABLET (1,000 MG TOTAL) BY MOUTH 2 (TWO) TIMES DAILY WITH A MEAL. 180 tablet 3  . Multiple Vitamins-Minerals (HEALTHY EYES PO) Take by mouth daily. Reported on 05/17/2015    . NON FORMULARY Place 2 L into the nose daily. Wears with exertion and qhs    . omeprazole (PRILOSEC) 20 MG capsule TAKE 1 CAPSULE (20 MG TOTAL) BY MOUTH DAILY. 90 capsule 1   No current facility-administered medications for this encounter.     Allergies  Allergen Reactions   . Doxycycline     REACTION: Wheals and pruritus    Social History   Social History  . Marital status: Divorced    Spouse name: N/A  . Number of children: 2  . Years of education: N/A   Occupational History  . unemployed Unemployed   Social History Main Topics  . Smoking status: Never Smoker  . Smokeless tobacco: Never Used  . Alcohol use No  . Drug use: No  . Sexual activity: Not Currently   Other Topics Concern  . Not on file   Social History Narrative   Single.   Divorced x2.   Has 2 children, by two different fathers.   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 May apply for disability/SSI given her pain in her hands which has made interviewing for jobs difficult.   Never Smoked   Alcohol use-no   Drug use-no   Regular exercise-yes      Financial assistance approved for 100% discount at [facility] and has [facility] card per Enedina Finner   02/18/2010             Family History  Problem Relation Age of Onset  . Diabetes Mother   . Obesity Mother   . Stroke Father   . Diabetes Sister   . Colon cancer Neg Hx     ROS- All systems are reviewed and negative except as per the HPI above  Physical Exam: Vitals:   04/14/16 0938  BP: 126/64  Pulse: (!) 50  Weight: 276 lb 6.4 oz (125.4 kg)  Height: 5' (1.524 m)   Wt Readings from Last 3 Encounters:  04/14/16 276 lb 6.4 oz (125.4 kg)  04/06/16 276 lb (125.2 kg)  03/30/16 281 lb (127.5 kg)    Labs: Lab Results  Component Value Date   NA 142 04/06/2016   K 4.8 04/06/2016   CL 101 04/06/2016   CO2 32 (H) 04/06/2016   GLUCOSE 107 (H) 04/06/2016   BUN 46 (H) 04/06/2016   CREATININE 1.60 (H) 04/06/2016   CALCIUM 9.9 04/06/2016   PHOS 3.9 01/10/2015   MG 2.0 01/10/2015   Lab Results  Component Value Date   INR 1.2 11/05/2007   Lab Results  Component Value Date   CHOL 134 07/02/2015   HDL 39 (L) 07/02/2015   LDLCALC 79 07/02/2015    TRIG 78 07/02/2015     GEN- The patient is well appearing, alert and oriented x 3 today.   Head- normocephalic, atraumatic Eyes-  Sclera clear, conjunctiva pink Ears- hearing intact Oropharynx- clear Neck- supple, no JVP Lymph- no cervical lymphadenopathy Lungs- Clear to ausculation bilaterally, normal work of breathing Heart- Regular rate and rhythm, no murmurs, rubs or gallops, PMI not laterally displaced GI- soft, NT, ND, + BS Extremities-  no clubbing, cyanosis, or edema MS- no significant deformity or atrophy Skin- no rash or lesion Psych- euthymic mood, full affect Neuro- strength and sensation are intact  EKG-Sinus brady with first degree AV block, LAD, v rate at 50 bpm, pr int 306 ms, qrs int 90 ms, qtc 413 ms Epic records reviewed    Cardiac monitoring-Normal sinus rhythm, sinus bradycardia with HR ranging from 40 to 80bpm.  Second degree type I AV block - Wenkebach block  Junctional bradycardia and junctional rhythm.  PVCs and ventricular couplets   Echo-9/16-Study Conclusions  - Left ventricle: The cavity size was normal. Wall thickness was   normal. Systolic function was normal. The estimated ejection   fraction was in the range of 55% to 60%. Wall motion was normal;   there were no regional wall motion abnormalities. The study is   not technically sufficient to allow evaluation of LV diastolic   function. - Left atrium: The atrium was mildly dilated. - Right atrium: The atrium was mildly dilated.  Impressions:  - Normal LV function; mild biatrial enlargement; trace MR and TR.    Assessment and Plan: 1. Paroxsymal atrial flutter asymptomatic Pt is in sinus rhythm today with slow v rates and first degree AV block No rate control is indicated due to brady, in fact most of arrhythmic's may be contraindicated as well due to baseline brady /conduction issues  She is on eliquis for a chadsvasc score of 3 Echo ordered by PCP but not scheduled, will get  scheduled for pt(04/28/16) Will review EKG's with Dr. Rayann Heman and will review Echo and then contact pt for f/u  Addendum-12/27- EKG's reviewed with Dr. Rayann Heman and he does feel that today's EKG is atrial flutter. Since pt is asymptomatic and has had AV conduction issues in the past, he suggests no further treatment is needed. Pt is currently slow without any rate control on board. Will continue with Eliquis.  Afib clinic as needed.   Geroge Baseman [place], [facility] [address] [place], [place] [address] [phone]

## 2016-04-28 ENCOUNTER — Ambulatory Visit (HOSPITAL_COMMUNITY)
Admission: RE | Admit: 2016-04-28 | Discharge: 2016-04-28 | Disposition: A | Discharge status: Home / Self Care | Payer: Commercial Managed Care - HMO | Source: Ambulatory Visit | Attending: Student in an Organized Health Care Education/Training Program | Admitting: Student in an Organized Health Care Education/Training Program

## 2016-04-28 DIAGNOSIS — E669 Obesity, unspecified: Secondary | ICD-10-CM | POA: Insufficient documentation

## 2016-04-28 DIAGNOSIS — I4891 Unspecified atrial fibrillation: Secondary | ICD-10-CM | POA: Diagnosis present

## 2016-04-28 DIAGNOSIS — Z6841 Body Mass Index (BMI) 40.0 and over, adult: Secondary | ICD-10-CM | POA: Diagnosis not present

## 2016-04-28 DIAGNOSIS — I480 Paroxysmal atrial fibrillation: Secondary | ICD-10-CM

## 2016-04-28 DIAGNOSIS — I119 Hypertensive heart disease without heart failure: Secondary | ICD-10-CM | POA: Diagnosis not present

## 2016-04-28 DIAGNOSIS — I351 Nonrheumatic aortic (valve) insufficiency: Secondary | ICD-10-CM | POA: Diagnosis not present

## 2016-04-28 DIAGNOSIS — E119 Type 2 diabetes mellitus without complications: Secondary | ICD-10-CM | POA: Insufficient documentation

## 2016-04-28 NOTE — Progress Notes (Signed)
Echocardiogram 2D Echocardiogram has been performed.  Kathleen [name] 04/28/2016, 1:52 PM

## 2016-05-05 ENCOUNTER — Encounter (INDEPENDENT_AMBULATORY_CARE_PROVIDER_SITE_OTHER): Payer: Medicare HMO | Admitting: Ophthalmology

## 2016-05-05 DIAGNOSIS — H43813 Vitreous degeneration, bilateral: Secondary | ICD-10-CM

## 2016-05-05 DIAGNOSIS — E113313 Type 2 diabetes mellitus with moderate nonproliferative diabetic retinopathy with macular edema, bilateral: Secondary | ICD-10-CM

## 2016-05-05 DIAGNOSIS — H35033 Hypertensive retinopathy, bilateral: Secondary | ICD-10-CM | POA: Diagnosis not present

## 2016-05-05 DIAGNOSIS — I10 Essential (primary) hypertension: Secondary | ICD-10-CM

## 2016-05-05 DIAGNOSIS — H33302 Unspecified retinal break, left eye: Secondary | ICD-10-CM | POA: Diagnosis not present

## 2016-05-05 DIAGNOSIS — E11311 Type 2 diabetes mellitus with unspecified diabetic retinopathy with macular edema: Secondary | ICD-10-CM | POA: Diagnosis not present

## 2016-05-05 LAB — HM DIABETES EYE EXAM

## 2016-05-06 ENCOUNTER — Encounter: Payer: Self-pay | Admitting: *Deleted

## 2016-05-09 ENCOUNTER — Other Ambulatory Visit: Payer: Self-pay | Admitting: Internal Medicine

## 2016-05-09 DIAGNOSIS — E1169 Type 2 diabetes mellitus with other specified complication: Secondary | ICD-10-CM

## 2016-05-12 ENCOUNTER — Other Ambulatory Visit: Payer: Self-pay | Admitting: Internal Medicine

## 2016-05-12 DIAGNOSIS — E1169 Type 2 diabetes mellitus with other specified complication: Secondary | ICD-10-CM

## 2016-05-15 ENCOUNTER — Other Ambulatory Visit: Payer: Self-pay | Admitting: *Deleted

## 2016-05-15 NOTE — Patient Outreach (Signed)
[facility]) Care Management  05/15/2016   ANALYSIA DUNGEE August 01, 1950 [id number]  RN Health Coach telephone call to patient.  Hipaa compliance verified. Per patient her fasting blood sugar today Is 133. Patient stated that she has done much better over the holidays with eating. Per patient she ate small amounts and did not over indulge. Per patient she feels better. Patient weight today is 260 pounds. She is holding the weight with small amount of swelling in ankle hardly noticeable. Patient is still receiving eye injections. Per patient her vision in improving from 20/40 to 20/30. Patient has not had any symptoms of hypo or hyperglycemia since last outreach call. Patient is making appointment for foot care. Patient is going and participating in exercise classes five times a week. Patient has agreed to follow up outreach calls.      Current Medications:  Current Outpatient Prescriptions  Medication Sig Dispense Refill  . ACCU-CHEK FASTCLIX LANCETS MISC TEST BLOOD SUGAR THREE TIMES DAILY 306 each 1  . amitriptyline (ELAVIL) 50 MG tablet Take 0.5 tablets (25 mg total) by mouth at bedtime. Reported on 04/17/2015 30 tablet 2  . apixaban (ELIQUIS) 5 MG TABS tablet Take 1 tablet (5 mg total) by mouth 2 (two) times daily. 60 tablet 11  . Blood Glucose Monitoring Suppl (ACCU-CHEK NANO SMARTVIEW) W/DEVICE KIT Check blood sugar 3 times daily 1 kit 0  . bumetanide (BUMEX) 1 MG tablet Take 1 tablet (1 mg total) by mouth 2 (two) times daily. 90 tablet 3  . gabapentin (NEURONTIN) 300 MG capsule TAKE 1 CAPSULE THREE TIMES DAILY 270 capsule 2  . glucose blood (ACCU-CHEK SMARTVIEW) test strip TEST BLOOD SUGAR THREE TIMES DAILY ICD-10 E11.9 100 each 11  . insulin detemir (LEVEMIR) 100 UNIT/ML injection Inject 0.87 mLs (87 Units total) into the skin at bedtime. 30 mL 5  . Insulin Syringe-Needle U-100 31G X 15/64" 1 ML MISC Use to inject insulin one time a day 100 each 5  . metFORMIN (GLUCOPHAGE)  1000 MG tablet TAKE 1 TABLET (1,000 MG TOTAL) BY MOUTH 2 (TWO) TIMES DAILY WITH A MEAL. 180 tablet 3  . Multiple Vitamins-Minerals (HEALTHY EYES PO) Take by mouth daily. Reported on 05/17/2015    . NON FORMULARY Place 2 L into the nose daily. Wears with exertion and qhs    . omeprazole (PRILOSEC) 20 MG capsule TAKE 1 CAPSULE (20 MG TOTAL) BY MOUTH DAILY. 90 capsule 1   No current facility-administered medications for this visit.     Functional Status:  In your present state of health, do you have any difficulty performing the following activities: 05/15/2016 04/13/2016  Hearing? N N  Vision? Y Y  Difficulty concentrating or making decisions? N N  Walking or climbing stairs? Y Y  Dressing or bathing? N N  Doing errands, shopping? N N  Preparing Food and eating ? N N  Using the Toilet? N N  In the past six months, have you accidently leaked urine? N N  Do you have problems with loss of bowel control? N N  Managing your Medications? N N  Managing your Finances? N N  Housekeeping or managing your Housekeeping? N N  Some recent data might be hidden    Fall/Depression Screening: PHQ 2/9 Scores 05/15/2016 04/13/2016 03/30/2016 03/16/2016 03/06/2016 02/21/2016 02/19/2016  PHQ - 2 Score 0 0 0 0 0 0 0    THN CM Care Plan Problem One   Flowsheet Row Most Recent Value  Role Documenting the  Problem One  [place] for Problem One  Active  THN Long Term Goal (31-90 days)  Patient will have a decrease of HGB A1C by one point in 90 days  Interventions for Problem One [place] discussed with patient what the  A1C is.  RN discussed how A1C is affected by the blood sugars.  RN sent EMMI information on  Why get A1C checked. RN sent educational material on Know your goal numbers. RN sent educational material on How the A1C helps.RN discussed possible reasons why patient A1C went up. RN Health Coach reiterate goals with each follow up   THN CM Short Term Goal #3 (0-30  days)  Patient will be able to verbalize foods high and low in carbohydrates within 30 days  THN CM Short Term Goal #3 Start Date  05/15/16  Interventions for Short Tern Goal #3  RN sent patient a picture chart on carbohydrates. RN sent EMMI information on counting carbohydrates. RN sent educational material on creating a healthy meal. RN will follow up within a month. RN sent patient a list of healthy snacks. RN will  reiterate eating a snack at bedtime. RN discuss with patient about looking at the snacks and not over eating since she has a little weight gain  THN CM Short Term Goal #5 (0-30 days)  Patient will be able  to verbalize  what she needs to do to Maintaining her overall health within 30 days  THN CM Short Term Goal #5 Start Date  05/15/16  Interventions for Short Term Goal #5  RN sent patient educational material on maintaining her health. RN will send educational on Diabetic retinopathy. RN will follow up with discussion and teach back . RN is assisting patinet to get the items needed to maintain good healt EX. (suppose hose)       Plan:  RN reiterated making healthy food choices. RN discussed follow up exams RN will follow up outreach within the month of February  Sandor Arboleda [place] [facility] [phone]

## 2016-06-01 ENCOUNTER — Encounter (INDEPENDENT_AMBULATORY_CARE_PROVIDER_SITE_OTHER): Payer: Medicare HMO | Admitting: Ophthalmology

## 2016-06-01 DIAGNOSIS — H43813 Vitreous degeneration, bilateral: Secondary | ICD-10-CM | POA: Diagnosis not present

## 2016-06-01 DIAGNOSIS — E11311 Type 2 diabetes mellitus with unspecified diabetic retinopathy with macular edema: Secondary | ICD-10-CM

## 2016-06-01 DIAGNOSIS — I10 Essential (primary) hypertension: Secondary | ICD-10-CM | POA: Diagnosis not present

## 2016-06-01 DIAGNOSIS — H35033 Hypertensive retinopathy, bilateral: Secondary | ICD-10-CM

## 2016-06-01 DIAGNOSIS — E113313 Type 2 diabetes mellitus with moderate nonproliferative diabetic retinopathy with macular edema, bilateral: Secondary | ICD-10-CM

## 2016-06-01 DIAGNOSIS — H33302 Unspecified retinal break, left eye: Secondary | ICD-10-CM

## 2016-06-03 ENCOUNTER — Encounter: Payer: Self-pay | Admitting: Podiatry

## 2016-06-03 ENCOUNTER — Ambulatory Visit (INDEPENDENT_AMBULATORY_CARE_PROVIDER_SITE_OTHER): Payer: Medicare HMO | Admitting: Podiatry

## 2016-06-03 VITALS — BP 130/65 | HR 43 | Resp 18

## 2016-06-03 DIAGNOSIS — M79676 Pain in unspecified toe(s): Secondary | ICD-10-CM

## 2016-06-03 DIAGNOSIS — E0842 Diabetes mellitus due to underlying condition with diabetic polyneuropathy: Secondary | ICD-10-CM

## 2016-06-03 DIAGNOSIS — B351 Tinea unguium: Secondary | ICD-10-CM

## 2016-06-03 NOTE — Progress Notes (Signed)
Patient ID: Kathleen [name], female   DOB: Sep 08, 1950, 66 y.o.   MRN: [id number]    Subjective: This patient presents again for schedule visit complaining of painful toenails and walking wearing shoes and is requesting nail debridement. Patient's power of attorney is present in the treatment room Patient lives in assisted living facility  Objective: Orientated 3 DP PT pulses 1/4 bilaterally Capillary reflex immediate bilaterally Sensation to 10 g monofilament wire intact 2/5 right 1/5 left Vibratory sensation nonreactive bilaterally Ankle reflex equal and reactive bilaterally Scaling skin bilaterally Varus rotated fourth and fifth toes bilaterally Open skin lesions bilaterally The toenails are hypertrophic, elongated, incurvated, discolored and tender direct palpation 6-10  Assessment: Diabetic with peripheral neuropathy Decrease pedal pulses suggestive you have possible peripheral arterial disease without any open wounds Symptomatic onychomycoses 6-10  Plan: Debrided toenails 10 mechanically and electrically without any bleeding  Reappoint 3 months

## 2016-06-03 NOTE — Patient Instructions (Signed)

## 2016-06-17 ENCOUNTER — Other Ambulatory Visit: Payer: Self-pay | Admitting: *Deleted

## 2016-06-22 NOTE — Patient Outreach (Signed)
[facility]) Care Management  06/22/2016 Late entry  Kathleen [name] 1951-03-20 [id number]  [place] telephone call to patient.  Hipaa compliance verified. Per patient her fasting blood sugar is 134. Patient stated she feels good. . Patient is exercising 5 days a week. Patient is trying to eat healthy. Sometimes her son brings her dinner. RN discussed with patient about take out foods and making sure she stays within her diet. Patient reported going for feet and eye exams. Patient has not had any hypo or hyperglycemic reactions since last out reach call. Patient reported having a hard time getting compression stockings to fit and not hut her as bad. Patient has been measured but the hose still hurts and per patient digs into her skin.  Patient has agreed to follow up outreach calls.    Objective:   Current Medications:  Current Outpatient Prescriptions  Medication Sig Dispense Refill  . ACCU-CHEK FASTCLIX LANCETS MISC TEST BLOOD SUGAR THREE TIMES DAILY 306 each 1  . amitriptyline (ELAVIL) 50 MG tablet Take 0.5 tablets (25 mg total) by mouth at bedtime. Reported on 04/17/2015 30 tablet 2  . apixaban (ELIQUIS) 5 MG TABS tablet Take 1 tablet (5 mg total) by mouth 2 (two) times daily. 60 tablet 11  . Blood Glucose Monitoring Suppl (ACCU-CHEK NANO SMARTVIEW) W/DEVICE KIT Check blood sugar 3 times daily 1 kit 0  . bumetanide (BUMEX) 1 MG tablet Take 1 tablet (1 mg total) by mouth 2 (two) times daily. 90 tablet 3  . gabapentin (NEURONTIN) 300 MG capsule TAKE 1 CAPSULE THREE TIMES DAILY 270 capsule 2  . glucose blood (ACCU-CHEK SMARTVIEW) test strip TEST BLOOD SUGAR THREE TIMES DAILY ICD-10 E11.9 100 each 11  . insulin detemir (LEVEMIR) 100 UNIT/ML injection Inject 0.87 mLs (87 Units total) into the skin at bedtime. 30 mL 5  . Insulin Syringe-Needle U-100 31G X 15/64" 1 ML MISC Use to inject insulin one time a day 100 each 5  . metFORMIN (GLUCOPHAGE) 1000 MG tablet TAKE 1  TABLET (1,000 MG TOTAL) BY MOUTH 2 (TWO) TIMES DAILY WITH A MEAL. 180 tablet 3  . Multiple Vitamins-Minerals (HEALTHY EYES PO) Take by mouth daily. Reported on 05/17/2015    . NON FORMULARY Place 2 L into the nose daily. Wears with exertion and qhs    . omeprazole (PRILOSEC) 20 MG capsule TAKE 1 CAPSULE (20 MG TOTAL) BY MOUTH DAILY. 90 capsule 1   No current facility-administered medications for this visit.     Functional Status:  In your present state of health, do you have any difficulty performing the following activities: 05/15/2016 04/13/2016  Hearing? N N  Vision? Y Y  Difficulty concentrating or making decisions? N N  Walking or climbing stairs? Y Y  Dressing or bathing? N N  Doing errands, shopping? N N  Preparing Food and eating ? N N  Using the Toilet? N N  In the past six months, have you accidently leaked urine? N N  Do you have problems with loss of bowel control? N N  Managing your Medications? N N  Managing your Finances? N N  Housekeeping or managing your Housekeeping? N N  Some recent data might be hidden    Fall/Depression Screening: PHQ 2/9 Scores 06/17/2016 05/15/2016 04/13/2016 03/30/2016 03/16/2016 03/06/2016 02/21/2016  PHQ - 2 Score 0 0 0 0 0 0 0   THN CM Care Plan Problem One   Flowsheet Row Most Recent Value  Care Plan Problem One  Knowledge deficit in self management of diabetes  Role Documenting the Problem One  [place] for Problem One  Active  THN Long Term Goal (31-90 days)  Patient will have a decrease of HGB A1C by one point in 90 days  THN Long Term Goal Start Date  06/22/16  Interventions for Problem One [place] discussed with patient what the  A1C is.  RN discussed how A1C is affected by the blood sugars.  RN sent EMMI information on  Why get A1C checked. RN sent educational material on Know your goal numbers. RN sent educational material on How the A1C helps.RN discussed possible reasons why patient A1C went  up. RN Health Coach reiterate goals with each follow up   THN CM Short Term Goal #1 (0-30 days)  patient will be able to verbalize obtaining appropiate fitting compression hose within the next 30 days  THN CM Short Term Goal #1 Start Date  06/22/16  Interventions for Short Term Goal #1  RN discussed with patient about getting the correct size compression hose. RN sent patient educational material on compression hose, How to order correct size and how to put them on. RN wil follow up with next discussion to see if patient obtained.. RN sent patient a brochure on places to get hose and meaure properly  THN CM Short Term Goal #3 (0-30 days)  Patient will be able to verbalize foods high and low in carbohydrates within 30 days  THN CM Short Term Goal #3 Start Date  06/22/16  Interventions for Short Tern Goal #3  RN sent patient a picture chart on carbohydrates. RN sent EMMI information on counting carbohydrates. RN sent educational material on creating a healthy meal. RN will follow up within a month. RN sent patient a list of healthy snacks. RN will  reiterate eating a snack at bedtime. RN discuss with patient about looking at the snacks and not over eating since she has a little weight gain. RN sent chart of healthy  foods from food chains for patient to pick from  [facility] CM Short Term Goal #5 (0-30 days)  Patient will be able  to verbalize  what she needs to do to Maintaining her overall health within 30 days  THN CM Short Term Goal #5 Start Date  06/22/16  Interventions for Short Term Goal #5  RN sent patient educational material on maintaining her health. RN will send educational on Diabetic retinopathy. RN will follow up with discussion and teach back . RN is assisting patinet to get the items needed to maintain good healt EX. (suppose hose) Reiterating with patient to meet those needs.      Assessment:  Patient will continue benefit from Health Coach telephonic outreach for education and support for  diabetes self management.  Plan:  RN discussed compression hose and getting a proper fit.  RN discussed  take out foods and diet adherence RN sent patient a list of local foods chains with healthy diabetic choices from them RN will follow up within the month of March.   [place] [facility] [phone]

## 2016-06-23 ENCOUNTER — Encounter: Payer: Self-pay | Admitting: *Deleted

## 2016-06-26 ENCOUNTER — Other Ambulatory Visit: Payer: Self-pay | Admitting: Internal Medicine

## 2016-06-26 ENCOUNTER — Encounter (INDEPENDENT_AMBULATORY_CARE_PROVIDER_SITE_OTHER): Payer: Medicare HMO | Admitting: Ophthalmology

## 2016-06-26 DIAGNOSIS — E11311 Type 2 diabetes mellitus with unspecified diabetic retinopathy with macular edema: Secondary | ICD-10-CM

## 2016-06-26 DIAGNOSIS — H33302 Unspecified retinal break, left eye: Secondary | ICD-10-CM | POA: Diagnosis not present

## 2016-06-26 DIAGNOSIS — I10 Essential (primary) hypertension: Secondary | ICD-10-CM | POA: Diagnosis not present

## 2016-06-26 DIAGNOSIS — E113313 Type 2 diabetes mellitus with moderate nonproliferative diabetic retinopathy with macular edema, bilateral: Secondary | ICD-10-CM | POA: Diagnosis not present

## 2016-06-26 DIAGNOSIS — H35033 Hypertensive retinopathy, bilateral: Secondary | ICD-10-CM | POA: Diagnosis not present

## 2016-06-26 DIAGNOSIS — H43813 Vitreous degeneration, bilateral: Secondary | ICD-10-CM

## 2016-06-29 ENCOUNTER — Telehealth: Payer: Self-pay | Admitting: Internal Medicine

## 2016-06-29 NOTE — Telephone Encounter (Signed)
APT. REMINDER CALL, LMTCB °

## 2016-06-30 ENCOUNTER — Ambulatory Visit (INDEPENDENT_AMBULATORY_CARE_PROVIDER_SITE_OTHER): Payer: Medicare HMO | Admitting: Internal Medicine

## 2016-06-30 VITALS — BP 137/56 | HR 53 | Temp 98.4°F | Ht 60.0 in | Wt 284.9 lb

## 2016-06-30 DIAGNOSIS — E1169 Type 2 diabetes mellitus with other specified complication: Secondary | ICD-10-CM | POA: Diagnosis not present

## 2016-06-30 DIAGNOSIS — Z79899 Other long term (current) drug therapy: Secondary | ICD-10-CM | POA: Diagnosis not present

## 2016-06-30 DIAGNOSIS — N183 Chronic kidney disease, stage 3 unspecified: Secondary | ICD-10-CM

## 2016-06-30 DIAGNOSIS — I130 Hypertensive heart and chronic kidney disease with heart failure and stage 1 through stage 4 chronic kidney disease, or unspecified chronic kidney disease: Secondary | ICD-10-CM

## 2016-06-30 DIAGNOSIS — I10 Essential (primary) hypertension: Secondary | ICD-10-CM | POA: Diagnosis not present

## 2016-06-30 DIAGNOSIS — Z0000 Encounter for general adult medical examination without abnormal findings: Secondary | ICD-10-CM | POA: Diagnosis not present

## 2016-06-30 DIAGNOSIS — I4892 Unspecified atrial flutter: Secondary | ICD-10-CM | POA: Diagnosis not present

## 2016-06-30 DIAGNOSIS — I5032 Chronic diastolic (congestive) heart failure: Secondary | ICD-10-CM | POA: Diagnosis not present

## 2016-06-30 DIAGNOSIS — G629 Polyneuropathy, unspecified: Secondary | ICD-10-CM

## 2016-06-30 DIAGNOSIS — E1142 Type 2 diabetes mellitus with diabetic polyneuropathy: Secondary | ICD-10-CM | POA: Diagnosis not present

## 2016-06-30 DIAGNOSIS — Z794 Long term (current) use of insulin: Secondary | ICD-10-CM | POA: Diagnosis not present

## 2016-06-30 DIAGNOSIS — Z7901 Long term (current) use of anticoagulants: Secondary | ICD-10-CM

## 2016-06-30 DIAGNOSIS — E1122 Type 2 diabetes mellitus with diabetic chronic kidney disease: Secondary | ICD-10-CM | POA: Diagnosis not present

## 2016-06-30 LAB — POCT GLYCOSYLATED HEMOGLOBIN (HGB A1C): Hemoglobin A1C: 8.3

## 2016-06-30 LAB — GLUCOSE, CAPILLARY: Glucose-Capillary: 75 mg/dL (ref 65–99)

## 2016-06-30 MED ORDER — AMITRIPTYLINE HCL 50 MG PO TABS: 25.0000 mg | ORAL_TABLET | Freq: Every day | ORAL | 2 refills | Status: DC

## 2016-06-30 MED ORDER — GABAPENTIN 300 MG PO CAPS: ORAL_CAPSULE | ORAL | 1 refills | Status: DC

## 2016-06-30 MED ORDER — LIRAGLUTIDE 18 MG/3ML ~~LOC~~ SOPN: 0.6000 mg | PEN_INJECTOR | Freq: Every day | SUBCUTANEOUS | 3 refills | Status: DC

## 2016-06-30 NOTE — Progress Notes (Signed)
   Subjective:    Patient ID: TAMAKIA PORTO, female    DOB: July 12, 1950, 66 y.o.   MRN: [id number]  HPI  I have seen and examined the patient. She is here for routine follow up of her DM and HTN.   Patient states she is compliant with her medications. She has noted worsening swelling in her legs over the last 2-3 weeks despite taking her bumex. She also complains of peripheral neuropathy pain despite her gabapentin. She has no other complaints at this time.   Review of Systems  Constitutional: Negative.   HENT: Negative.   Respiratory: Negative.   Cardiovascular: Positive for leg swelling. Negative for chest pain and palpitations.  Gastrointestinal: Negative.   Musculoskeletal: Negative.   Neurological: Negative.   Psychiatric/Behavioral: Negative.        Objective:   Physical Exam  Constitutional: She is oriented to person, place, and time. She appears well-developed and well-nourished.  HENT:  Head: Normocephalic and atraumatic.  Mouth/Throat: No oropharyngeal exudate.  Neck: Neck supple.  Cardiovascular: Normal rate, regular rhythm and normal heart sounds.   No murmur heard. Pulmonary/Chest: Effort normal and breath sounds normal. She has no wheezes. She has no rales.  Abdominal: Soft. Bowel sounds are normal. She exhibits no distension. There is no tenderness.  Musculoskeletal: Normal range of motion. She exhibits edema.  2 + b/l LE edema noted  Lymphadenopathy:    She has no cervical adenopathy.  Neurological: She is alert and oriented to person, place, and time.  Skin: Skin is warm. No rash noted.  Psychiatric: She has a normal mood and affect. Her behavior is normal.          Assessment & Plan:  Please see problem based charting for assessment and plan:

## 2016-06-30 NOTE — Assessment & Plan Note (Signed)
BP Readings from Last 3 Encounters:  06/30/16 (!) 137/56  06/03/16 130/65  04/14/16 126/64    Lab Results  Component Value Date   NA 142 04/06/2016   K 4.8 04/06/2016   CREATININE 1.60 (H) 04/06/2016    Assessment: Blood pressure control:  well controlled Progress toward BP goal:   at goal Comments: compliant with bumex  Plan: Medications:  continue current medications Educational resources provided: brochure (denies need ) Self management tools provided:   Other plans: check BMP and lipid profile today. Consider adding a statin to her regimen.

## 2016-06-30 NOTE — Assessment & Plan Note (Signed)
-   Found to have aflutter by cardiology and started on Eliquis - She is bradycardic at baseline and does not require rate control agents - Patient is asymptomatic currently with no lightheadedness or palpitations

## 2016-06-30 NOTE — Assessment & Plan Note (Signed)
-   Patient with increased lower extremity swelling over the last 2-3 weeks - Instructed patient to increase her bumex to 1 mg TID and follow up in 1 week - Will check BMP again in 1 week at follow up - Will need outpatient follow up with cardiology - 2 D ECHO was done recently with normal EF and normal wall motion with trace AR

## 2016-06-30 NOTE — Assessment & Plan Note (Signed)
Lab Results  Component Value Date   HGBA1C 8.3 06/30/2016   HGBA1C 7.7 03/24/2016   HGBA1C 7.9 12/10/2015     Assessment: Diabetes control:  fair Progress toward A1C goal:   deteriorated Comments: patient is compliant with levemir and metformin. Patient with an average BS of 157 with a high of 421 and a low of 40.  Plan: Medications:  Will start patient on Victoza 0.6 mg SQ once daily and decrease her levemir to 82 units given her episodes of hypoglycemia Home glucose monitoring: Frequency:   Timing:   Instruction/counseling given: reminded to bring blood glucose meter & log to each visit, discussed the need for weight loss and discussed diet Educational resources provided: brochure (denies need ) Self management tools provided: copy of home glucose meter download, home glucose logbook Other plans: Will check BMP and lipid profile today

## 2016-06-30 NOTE — Patient Instructions (Addendum)
-   It was a pleasure seeing you today - Please follow up next week for recheck of your lower extremity swellling on the increased dose of bumex - We increased your gabapentin at night to 2 pills. Continue with 1 pill during the morning and 1 in the afternoon - We have started victoza for your diabetes - We will check your blood test and urine test today - You will need a PAP smear on follow up next week

## 2016-06-30 NOTE — Assessment & Plan Note (Signed)
-   Will check HIV antibody today - Unfortunately she did not have enough time to get her pap smear done today. We will do this at her follow up appointment next week

## 2016-06-30 NOTE — Assessment & Plan Note (Addendum)
-   Patient with persistent pain in her feet worse at night despite compliance with gabapentin - Will increase her gabapentin to 600 mg at night and continue with 300 mg in the am and afternoon and refill her elavil - Can titrate gabapentin up if her pain is persistent

## 2016-06-30 NOTE — Assessment & Plan Note (Signed)
-   Her creatinine was at baseline over her last 2 BMPs - No urinary complaints today - Will check BMP today prior to increasing her bumex to 1 mg TID

## 2016-07-01 ENCOUNTER — Telehealth: Payer: Self-pay | Admitting: Internal Medicine

## 2016-07-01 ENCOUNTER — Other Ambulatory Visit: Payer: Self-pay | Admitting: *Deleted

## 2016-07-01 LAB — BMP8+ANION GAP
ANION GAP: 18 mmol/L (ref 10.0–18.0)
BUN/Creatinine Ratio: 22 (ref 12–28)
BUN: 33 mg/dL — ABNORMAL HIGH (ref 8–27)
CALCIUM: 9.3 mg/dL (ref 8.7–10.3)
CHLORIDE: 100 mmol/L (ref 96–106)
CO2: 27 mmol/L (ref 18–29)
Creatinine, Ser: 1.48 mg/dL — ABNORMAL HIGH (ref 0.57–1.00)
GFR calc Af Amer: 43 mL/min/{1.73_m2} — ABNORMAL LOW (ref >59)
GFR calc non Af Amer: 37 mL/min/{1.73_m2} — ABNORMAL LOW (ref >59)
GLUCOSE: 68 mg/dL (ref 65–99)
POTASSIUM: 4.2 mmol/L (ref 3.5–5.2)
Sodium: 145 mmol/L — ABNORMAL HIGH (ref 134–144)

## 2016-07-01 LAB — HIV ANTIBODY (ROUTINE TESTING W REFLEX): HIV Screen 4th Generation wRfx: NONREACTIVE

## 2016-07-01 LAB — LIPID PANEL
CHOLESTEROL TOTAL: 128 mg/dL (ref 100–199)
Chol/HDL Ratio: 3 ratio units (ref 0.0–4.4)
HDL: 42 mg/dL (ref >39)
LDL Calculated: 71 mg/dL (ref 0–99)
TRIGLYCERIDES: 77 mg/dL (ref 0–149)
VLDL Cholesterol Cal: 15 mg/dL (ref 5–40)

## 2016-07-01 LAB — MICROALBUMIN / CREATININE URINE RATIO
Creatinine, Urine: 19.6 mg/dL
MICROALB/CREAT RATIO: 52 mg/g{creat} — AB (ref 0.0–30.0)
Microalbumin, Urine: 10.2 ug/mL

## 2016-07-01 MED ORDER — PEN NEEDLES 32G X 5 MM MISC: 0.6000 mg | Freq: Every day | 6 refills | Status: DC

## 2016-07-01 NOTE — Telephone Encounter (Signed)
Needs to talk to nurse please call

## 2016-07-01 NOTE — Telephone Encounter (Signed)
Spoke w/ pt, request sent to dr Dareen Piano

## 2016-07-01 NOTE — Telephone Encounter (Signed)
Pt is calling back to speak with a nurse. 

## 2016-07-07 ENCOUNTER — Other Ambulatory Visit: Payer: Self-pay | Admitting: Internal Medicine

## 2016-07-07 ENCOUNTER — Telehealth: Payer: Self-pay | Admitting: Internal Medicine

## 2016-07-07 DIAGNOSIS — Z1231 Encounter for screening mammogram for malignant neoplasm of breast: Secondary | ICD-10-CM

## 2016-07-07 NOTE — Telephone Encounter (Signed)
APT. REMINDER CALL, LMTCB °

## 2016-07-08 ENCOUNTER — Ambulatory Visit (INDEPENDENT_AMBULATORY_CARE_PROVIDER_SITE_OTHER): Payer: Medicare HMO | Admitting: Internal Medicine

## 2016-07-08 ENCOUNTER — Encounter: Payer: Self-pay | Admitting: Internal Medicine

## 2016-07-08 ENCOUNTER — Other Ambulatory Visit (HOSPITAL_COMMUNITY)
Admission: RE | Admit: 2016-07-08 | Discharge: 2016-07-08 | Disposition: A | Discharge status: Home / Self Care | Payer: Medicare HMO | Source: Ambulatory Visit | Attending: Internal Medicine | Admitting: Internal Medicine

## 2016-07-08 VITALS — BP 156/62 | HR 50 | Temp 97.8°F | Wt 284.0 lb

## 2016-07-08 DIAGNOSIS — Z01411 Encounter for gynecological examination (general) (routine) with abnormal findings: Secondary | ICD-10-CM | POA: Insufficient documentation

## 2016-07-08 DIAGNOSIS — Z0000 Encounter for general adult medical examination without abnormal findings: Secondary | ICD-10-CM

## 2016-07-08 DIAGNOSIS — I5032 Chronic diastolic (congestive) heart failure: Secondary | ICD-10-CM | POA: Diagnosis not present

## 2016-07-08 DIAGNOSIS — Z01419 Encounter for gynecological examination (general) (routine) without abnormal findings: Secondary | ICD-10-CM

## 2016-07-08 DIAGNOSIS — Z79899 Other long term (current) drug therapy: Secondary | ICD-10-CM

## 2016-07-08 DIAGNOSIS — I872 Venous insufficiency (chronic) (peripheral): Secondary | ICD-10-CM | POA: Diagnosis not present

## 2016-07-08 NOTE — Patient Instructions (Signed)
Kathleen [name],  It was a pleasure seeing you today. I am glad your leg swelling has improved. We will call you with the results of your PAP smear and blood test from today. Pending the results of your test, we may increase the dose of your Bumex to further help the swelling in your legs. If you have any questions or concerns, call our clinic at [phone] or after hours call [phone] and ask for the internal medicine resident on call. Thank you!

## 2016-07-08 NOTE — Progress Notes (Signed)
   CC: Lower extremity swelling f/u   HPI:  Kathleen [name] is a 66 y.o. F with pmhx outlined below here for follow up of her lower extremity swelling and for a pap smear. She was seen in clinic one week ago by her PCP. She was complaining of lower extremity swelling at that time and her bumex was increased to 1mg  TID. She has tolerated this dose well and reports improvement in her swelling. She has no complaints today and feels well.  Past Medical History:  Diagnosis Date  . Atrial flutter ([place]) 04/06/2016  . Bradycardia 04/06/2016   type 1 second degree AV block  . Chronic diastolic (congestive) heart failure 01/10/2015  . Degenerative joint disease of hand   . Diabetes mellitus   . Dyslipidemia   . Fecal occult blood test positive   . GERD (gastroesophageal reflux disease)   . Headache   . Hypertension   . Inadequate material resources   . Irritable bowel syndrome   . Obesity   . Post-menopausal bleeding   . Postmenopausal   . Shortness of breath dyspnea    multifactorial from obesity, deconditioning, obesity hypoventilation syndrome    Review of Systems:  All pertinents listed in HPI, otherwise negative  Physical Exam:  Vitals:   07/08/16 1324  BP: (!) 156/62  Pulse: (!) 50  Temp: 97.8 F (36.6 C)  TempSrc: Oral  SpO2: 92%  Weight: 284 lb (128.8 kg)    Constitutional: NAD, appears comfortable Cardiovascular: RRR Pulmonary/Chest: CTAB GU: Normal cervix without obvious lesion or mass. Minimal white discharge.  Extremities: Warm and well perfused. Distal pulses intact. 1+ pitting edema to the mid shin bilaterally with some chronic overlying venous statis dermatitis.    Assessment & Plan:   See Encounters Tab for problem based charting.  Patient seen with Dr. Dareen Piano

## 2016-07-08 NOTE — Assessment & Plan Note (Addendum)
PAP smear performed today and sent for cytology. She is asymptomatic and doing well. Last PAP smear in 2013 was negative for intraepithelial lesions or malignancy.  -- Cytology pending, f/u results

## 2016-07-08 NOTE — Assessment & Plan Note (Addendum)
Patient is here today for follow up of her lower extremity swelling. Her bumex was increased 1 week ago to 1mg  TID and she is tolerating this well. Patient was examined today with Dr. Dareen Piano (PCP) who feels her lower extremity swelling has improved, although she has persistent 1+ pitting edema to the mid shins bilaterally with some chronic overlying venous stasis dermatitis. Patient instructed to decrease her Bumex dose to 1 mg BID pending the results of her BMP, as she has had a history of renal dysfunction at higher bumex dosing in the past. If renal function is stable today, dose may be increased back to TID. -- Decrease Bumex to 1 mg BID for now -- Repeat BMP; if renal function stable, will increase back to TID   ADDENDUM: Renal function is stable. Per Dr. Dareen Piano he would like for her to continue the BID dosing for now as her lower extremity swelling has improved. Called patient and left voicemail. Instructed her to call our clinic with any questions.

## 2016-07-09 LAB — BMP8+ANION GAP
Anion Gap: 18 mmol/L (ref 10.0–18.0)
BUN / CREAT RATIO: 26 (ref 12–28)
BUN: 40 mg/dL — ABNORMAL HIGH (ref 8–27)
CO2: 27 mmol/L (ref 18–29)
Calcium: 9.3 mg/dL (ref 8.7–10.3)
Chloride: 100 mmol/L (ref 96–106)
Creatinine, Ser: 1.55 mg/dL — ABNORMAL HIGH (ref 0.57–1.00)
GFR calc non Af Amer: 35 mL/min/{1.73_m2} — ABNORMAL LOW (ref >59)
GFR, EST AFRICAN AMERICAN: 41 mL/min/{1.73_m2} — AB (ref >59)
Glucose: 104 mg/dL — ABNORMAL HIGH (ref 65–99)
Potassium: 4.1 mmol/L (ref 3.5–5.2)
Sodium: 145 mmol/L — ABNORMAL HIGH (ref 134–144)

## 2016-07-09 NOTE — Progress Notes (Signed)
Internal Medicine Clinic Attending  I saw and evaluated the patient.  I personally confirmed the key portions of the history and exam documented by Dr. Guilloud and I reviewed pertinent patient test results.  The assessment, diagnosis, and plan were formulated together and I agree with the documentation in the resident's note.  

## 2016-07-13 LAB — CYTOLOGY - PAP: DIAGNOSIS: NEGATIVE

## 2016-07-14 ENCOUNTER — Encounter: Payer: Self-pay | Admitting: Internal Medicine

## 2016-07-15 ENCOUNTER — Other Ambulatory Visit: Payer: Self-pay | Admitting: *Deleted

## 2016-07-15 NOTE — Patient Outreach (Signed)
[place] [facility]) Care Management  07/15/2016   Kathleen [name] 29-Sep-1950 [id number]  RN Health Coach telephone call to patient.  Hipaa compliance verified. Patient stated her blood sugar today was 123 fasting. Her A1C has increased to 8.3 Per patient she hasn't made any changes in her diet. RN noted that when patient was reading some of her fasting blood sugars off from the last 3 months. Some of the blood sugars was above 130 and 150. Patient has been to have eye exam,  podiatrist and mammogram. She is making an appointment for dental because she has ill fitting dentures. Patient stated she had some swelling in feet and ankles at her last Dr visit. After the increase of Bumex the swelling is down. Patient is having som discomfort in he feet. Per patient she takes Aleve. Patient stated her O2 saturations are running 92-93% and she is using the aleve for discomfort. Patient stated she has been taking her medications as per ordered.    Current Medications:  Current Outpatient Prescriptions  Medication Sig Dispense Refill  . ACCU-CHEK FASTCLIX LANCETS MISC TEST BLOOD SUGAR THREE TIMES DAILY 306 each 1  . amitriptyline (ELAVIL) 50 MG tablet Take 0.5 tablets (25 mg total) by mouth at bedtime. 30 tablet 2  . apixaban (ELIQUIS) 5 MG TABS tablet Take 1 tablet (5 mg total) by mouth 2 (two) times daily. 60 tablet 11  . Blood Glucose Monitoring Suppl (ACCU-CHEK NANO SMARTVIEW) W/DEVICE KIT Check blood sugar 3 times daily 1 kit 0  . bumetanide (BUMEX) 1 MG tablet Take 1 tablet (1 mg total) by mouth 2 (two) times daily. 90 tablet 3  . gabapentin (NEURONTIN) 300 MG capsule TAKE 1 CAPSULE in the morning and afternoon and 2 at night 360 capsule 1  . glucose blood (ACCU-CHEK SMARTVIEW) test strip TEST BLOOD SUGAR THREE TIMES DAILY ICD-10 E11.9 100 each 11  . insulin detemir (LEVEMIR) 100 UNIT/ML injection Inject 0.87 mLs (87 Units total) into the skin at bedtime. 30 mL 5  . Insulin Pen Needle (PEN  NEEDLES) 32G X 5 MM MISC 0.6 mg by Does not apply route daily. Use 1 time daily to inject victoza. diag code E11.9 insulin dependent 90 each 6  . Insulin Syringe-Needle U-100 31G X 15/64" 1 ML MISC Use to inject insulin one time a day 100 each 5  . liraglutide (VICTOZA) 18 MG/3ML SOPN Inject 0.1 mLs (0.6 mg total) into the skin daily. 3 mL 3  . metFORMIN (GLUCOPHAGE) 1000 MG tablet TAKE 1 TABLET (1,000 MG TOTAL) BY MOUTH 2 (TWO) TIMES DAILY WITH A MEAL. 180 tablet 3  . Multiple Vitamins-Minerals (HEALTHY EYES PO) Take by mouth daily. Reported on 05/17/2015    . NON FORMULARY Place 2 L into the nose daily. Wears with exertion and qhs    . omeprazole (PRILOSEC) 20 MG capsule TAKE 1 CAPSULE (20 MG TOTAL) BY MOUTH DAILY. 90 capsule 1   No current facility-administered medications for this visit.     Functional Status:  In your present state of health, do you have any difficulty performing the following activities: 07/15/2016 07/15/2016  Hearing? N N  Vision? N N  Difficulty concentrating or making decisions? N N  Walking or climbing stairs? Y Y  Dressing or bathing? N N  Doing errands, shopping? N N  Preparing Food and eating ? N N  Using the Toilet? N N  In the past six months, have you accidently leaked urine? N N  Do you  have problems with loss of bowel control? N N  Managing your Medications? N N  Managing your Finances? N N  Housekeeping or managing your Housekeeping? - N  Some recent data might be hidden    Fall/Depression Screening: PHQ 2/9 Scores 07/15/2016 07/08/2016 06/30/2016 06/17/2016 05/15/2016 04/13/2016 03/30/2016  PHQ - 2 Score 0 0 0 0 0 0 0   THN CM Care Plan Problem One     Most Recent Value  Care Plan Problem One  Knowledge deficit in self management of diabetes  Role Documenting the Problem One  [place] for Problem One  Active  THN Long Term Goal (31-90 days)  Patient will have a decrease of HGB A1C by one point in 90 days  THN Long Term Goal Start Date   07/15/16  Interventions for Problem One [place] discussed with patient what the  A1C is.  RN discussed how A1C is affected by the blood sugars.  RN sent EMMI information on  Why get A1C checked. RN sent educational material on Know your goal numbers. RN sent educational material on How the A1C helps.RN discussed possible reasons why patient A1C went up. RN Health Coach reiterate goals with each follow up   THN CM Short Term Goal #1 (0-30 days)  patient will be able to verbalize obtaining appropiate fitting compression hose within the next 30 days  THN CM Short Term Goal #1 Start Date  07/15/16  Interventions for Short Term Goal #1  RN discussed with patient about getting the correct size compression hose. RN sent patient educational material on compression hose, How to order correct size and how to put them on. RN wil follow up with next discussion to see if patient obtained.. RN sent patient a brochure on places to get hose and meaure properly  THN CM Short Term Goal #3 (0-30 days)  Patient will be able to verbalize foods high and low in carbohydrates within 30 days  THN CM Short Term Goal #3 Start Date  07/15/16  Interventions for Short Tern Goal #3  RN sent patient a picture chart on carbohydrates. RN sent EMMI information on counting carbohydrates. RN sent educational material on creating a healthy meal. RN will follow up within a month. RN sent patient a list of healthy snacks. RN will  reiterate eating a snack at bedtime. RN discuss with patient about looking at the snacks and not over eating since she has a little weight gain. RN sent chart of healthy  foods from food chains for patient to pick from  [facility] CM Short Term Goal #5 (0-30 days)  Patient will be able  to verbalize  what she needs to do to Maintaining her overall health within 30 days  THN CM Short Term Goal #5 Start Date  07/15/16  Interventions for Short Term Goal #5  RN sent patient educational material on  maintaining her health. RN will send educational on Diabetic retinopathy. RN will follow up with discussion and teach back . RN is assisting patinet to get the items needed to maintain good healt EX. (suppose hose) Reiterating with patient to meet those needs.      Assessment:  Patient A1C has increased to 8.3 Patient is following up with health maintenance Patient will continue to benefit from [place] telephonic outreach for education and support for diabetes self management.   Plan:  RN discussed increase in A1C RN discussed the goals of  fasting blood  glucose 80-130  to be able to keep the A1C <7 RN will follow up outreach within the month of April   [facility] [place] Care Management [phone]

## 2016-07-23 ENCOUNTER — Encounter (INDEPENDENT_AMBULATORY_CARE_PROVIDER_SITE_OTHER): Payer: Medicare HMO | Admitting: Ophthalmology

## 2016-07-23 DIAGNOSIS — H35033 Hypertensive retinopathy, bilateral: Secondary | ICD-10-CM

## 2016-07-23 DIAGNOSIS — I10 Essential (primary) hypertension: Secondary | ICD-10-CM

## 2016-07-23 DIAGNOSIS — H43813 Vitreous degeneration, bilateral: Secondary | ICD-10-CM

## 2016-07-23 DIAGNOSIS — H33303 Unspecified retinal break, bilateral: Secondary | ICD-10-CM

## 2016-07-23 DIAGNOSIS — E11311 Type 2 diabetes mellitus with unspecified diabetic retinopathy with macular edema: Secondary | ICD-10-CM | POA: Diagnosis not present

## 2016-07-23 DIAGNOSIS — E113313 Type 2 diabetes mellitus with moderate nonproliferative diabetic retinopathy with macular edema, bilateral: Secondary | ICD-10-CM

## 2016-08-13 ENCOUNTER — Ambulatory Visit
Admission: RE | Admit: 2016-08-13 | Discharge: 2016-08-13 | Disposition: A | Discharge status: Home / Self Care | Payer: Medicare HMO | Source: Ambulatory Visit | Attending: Internal Medicine | Admitting: Internal Medicine

## 2016-08-13 DIAGNOSIS — Z1231 Encounter for screening mammogram for malignant neoplasm of breast: Secondary | ICD-10-CM | POA: Diagnosis not present

## 2016-08-14 ENCOUNTER — Other Ambulatory Visit: Payer: Self-pay | Admitting: *Deleted

## 2016-08-14 ENCOUNTER — Telehealth: Payer: Self-pay

## 2016-08-14 DIAGNOSIS — E0800 Diabetes mellitus due to underlying condition with hyperosmolarity without nonketotic hyperglycemic-hyperosmolar coma (NKHHC): Principal | ICD-10-CM

## 2016-08-14 DIAGNOSIS — Z794 Long term (current) use of insulin: Secondary | ICD-10-CM

## 2016-08-14 NOTE — Patient Outreach (Signed)
[facility]) Care Management  08/14/2016  GENELLE ECONOMOU [birth year] [id number]  RN Health Coach  attempted #1 Follow up outreach call to patient.  Patient was unavailable. HIPPA compliance voicemail message was left with return callback number.   Plan: RN will call patient again within 14 days.  [place] Care Management [phone]

## 2016-08-14 NOTE — Telephone Encounter (Signed)
Refuses appt, advised to go to pharmacy, speak w/ pharmacist and use OTC meds, call if she wants appt soon. She is agreeable. Do you agree?

## 2016-08-14 NOTE — Telephone Encounter (Signed)
yes

## 2016-08-14 NOTE — Telephone Encounter (Signed)
Requesting cough med. Please call pt back.  

## 2016-08-14 NOTE — Patient Outreach (Signed)
[place] [facility]) Care Management  08/14/2016   Kathleen [name] December 27, 1950 [id number]     RN Health Coach received return telephone call from patient.  Hipaa compliance verified. Per patient she is hoarse and coughing. Per patient she had a fever earlier this week with nausea, vomiting and diarrhea. Patient reports feeling cold. Patient stated her appetite was poor and she is able to keep jello down. Patient reported productive yellow sputum. Patient stated her blood sugar was elevated 180. Rn discussed with patient about calling Dr office and making aware. Patient has not felt like exercising or doing very much. Patient is unable to get transportation to her Dr office. Her sister is out of town and the [place] transportation requires 3 days in advance.  Patient has agreed to follow up outreach call.     Current Medications:  Current Outpatient Prescriptions  Medication Sig Dispense Refill  . ACCU-CHEK FASTCLIX LANCETS MISC TEST BLOOD SUGAR THREE TIMES DAILY 306 each 1  . amitriptyline (ELAVIL) 50 MG tablet Take 0.5 tablets (25 mg total) by mouth at bedtime. 30 tablet 2  . apixaban (ELIQUIS) 5 MG TABS tablet Take 1 tablet (5 mg total) by mouth 2 (two) times daily. 60 tablet 11  . Blood Glucose Monitoring Suppl (ACCU-CHEK NANO SMARTVIEW) W/DEVICE KIT Check blood sugar 3 times daily 1 kit 0  . bumetanide (BUMEX) 1 MG tablet Take 1 tablet (1 mg total) by mouth 2 (two) times daily. 90 tablet 3  . gabapentin (NEURONTIN) 300 MG capsule TAKE 1 CAPSULE in the morning and afternoon and 2 at night 360 capsule 1  . glucose blood (ACCU-CHEK SMARTVIEW) test strip TEST BLOOD SUGAR THREE TIMES DAILY ICD-10 E11.9 100 each 11  . insulin detemir (LEVEMIR) 100 UNIT/ML injection Inject 0.87 mLs (87 Units total) into the skin at bedtime. 30 mL 5  . Insulin Pen Needle (PEN NEEDLES) 32G X 5 MM MISC 0.6 mg by Does not apply route daily. Use 1 time daily to inject victoza. diag code E11.9 insulin dependent  90 each 6  . Insulin Syringe-Needle U-100 31G X 15/64" 1 ML MISC Use to inject insulin one time a day 100 each 5  . liraglutide (VICTOZA) 18 MG/3ML SOPN Inject 0.1 mLs (0.6 mg total) into the skin daily. 3 mL 3  . metFORMIN (GLUCOPHAGE) 1000 MG tablet TAKE 1 TABLET (1,000 MG TOTAL) BY MOUTH 2 (TWO) TIMES DAILY WITH A MEAL. 180 tablet 3  . Multiple Vitamins-Minerals (HEALTHY EYES PO) Take by mouth daily. Reported on 05/17/2015    . NON FORMULARY Place 2 L into the nose daily. Wears with exertion and qhs    . omeprazole (PRILOSEC) 20 MG capsule TAKE 1 CAPSULE (20 MG TOTAL) BY MOUTH DAILY. 90 capsule 1   No current facility-administered medications for this visit.     Functional Status:  In your present state of health, do you have any difficulty performing the following activities: 08/14/2016 07/15/2016  Hearing? N N  Vision? N N  Difficulty concentrating or making decisions? N N  Walking or climbing stairs? Y Y  Dressing or bathing? N N  Doing errands, shopping? N N  Preparing Food and eating ? N N  Using the Toilet? N N  In the past six months, have you accidently leaked urine? N N  Do you have problems with loss of bowel control? N N  Managing your Medications? N N  Managing your Finances? N N  Housekeeping or managing your Housekeeping? N -  Some recent data might be hidden    Fall/Depression Screening: Fall Risk  08/14/2016 07/15/2016 07/08/2016  Falls in the past year? No No No  Number falls in past yr: 1 - 1  Injury with Fall? No - No  Risk Factor Category  High Fall Risk - High Fall Risk  Risk for fall due to : Impaired balance/gait;Impaired mobility - History of fall(s);Impaired balance/gait  Risk for fall due to (comments): - - -  Follow up Falls evaluation completed;Education provided;Falls prevention discussed - Falls prevention discussed;Education provided   [facility] 2/9 Scores 08/14/2016 07/15/2016 07/08/2016 06/30/2016 06/17/2016 05/15/2016 04/13/2016  PHQ - 2 Score 0 0 0 0 0 0 0    THN CM Care Plan Problem One     Most Recent Value  Care Plan Problem One  Knowledge deficit in self management of diabetes  Role Documenting the Problem One  [place] for Problem One  Active  THN Long Term Goal (31-90 days)  Patient will have a decrease of HGB A1C by one point in 90 days  THN Long Term Goal Start Date  08/14/16  Interventions for Problem One [place] discussed with patient what the  A1C is.  RN discussed how A1C is affected by the blood sugars.  RN sent EMMI information on  Why get A1C checked. RN sent educational material on Know your goal numbers. RN sent educational material on How the A1C helps.RN discussed possible reasons why patient A1C went up. RN Health Coach reiterate goals with each follow up   THN CM Short Term Goal #1 (0-30 days)  patient will be able to verbalize obtaining appropiate fitting compression hose within the next 30 days  THN CM Short Term Goal #1 Start Date  08/14/16  Interventions for Short Term Goal #1  RN discussed with patient about getting the correct size compression hose. RN sent patient educational material on compression hose, How to order correct size and how to put them on. RN wil follow up with next discussion to see if patient obtained.. RN sent patient a brochure on places to get hose and meaure properly     Assessment:  Patient is having a sick day Patient will continue to  benefit from [employer] telephonic outreach for education and support for diabetes self management.  Plan:  Referred to social worker for transportation needs Patient will notify physician today RN discussed keeping hydrated RN discussed diabetic sick day care RN will follow up with patient within the month of May  Kathleen [name] Management [phone]

## 2016-08-17 ENCOUNTER — Other Ambulatory Visit: Payer: Self-pay | Admitting: Internal Medicine

## 2016-08-17 ENCOUNTER — Other Ambulatory Visit: Payer: Self-pay | Admitting: *Deleted

## 2016-08-17 ENCOUNTER — Telehealth: Payer: Self-pay | Admitting: Internal Medicine

## 2016-08-17 DIAGNOSIS — R928 Other abnormal and inconclusive findings on diagnostic imaging of breast: Secondary | ICD-10-CM

## 2016-08-17 NOTE — Patient Outreach (Signed)
[place] [facility]) Care Management  08/17/2016  Kathleen [name] 05/18/1950 [id number]   Phone call to patient to assess for transportation needs to a stat appointment. Per patient, she did not make the appointment to see the doctor because her condition has improved. Patient states that her fever has come down and her appetite has returned. Patient reports her usual mode of transportation is Medicaid transportation and will plan to use this resource for all future medical transportation request.  Patient discussed having no additional community resource needs.   Plan: Patient to be closed to social work at this time.   Sheralyn Boatman [facility] Care Management [phone]

## 2016-08-17 NOTE — Telephone Encounter (Signed)
-   Called patient to discuss mammogram results. Per repot patient with possible suspicious mass in left breast and will need a diagnostic mammogram and possibly an ultrasound.Patient expresses understanding and will contact mammogram center for follow up. - patient also wanted to know the results of her pap smear done ion the clinic 1 month ago and I discussed this with her as well. No evidence of malignant changes - Patient also needs a handicap sticker renewal and will bring the paperwork to the office

## 2016-08-24 ENCOUNTER — Other Ambulatory Visit: Payer: Self-pay | Admitting: Internal Medicine

## 2016-08-24 DIAGNOSIS — E1169 Type 2 diabetes mellitus with other specified complication: Secondary | ICD-10-CM

## 2016-08-24 DIAGNOSIS — I5032 Chronic diastolic (congestive) heart failure: Secondary | ICD-10-CM

## 2016-08-24 DIAGNOSIS — K219 Gastro-esophageal reflux disease without esophagitis: Secondary | ICD-10-CM | POA: Insufficient documentation

## 2016-08-24 NOTE — Assessment & Plan Note (Signed)
-   Patient has been on omeprazole chronically for GERD - Will discuss this with patient on follow up visit and consider trial off meds to see if she still requires this

## 2016-08-27 ENCOUNTER — Encounter (INDEPENDENT_AMBULATORY_CARE_PROVIDER_SITE_OTHER): Payer: Medicare HMO | Admitting: Ophthalmology

## 2016-08-27 ENCOUNTER — Other Ambulatory Visit: Payer: Self-pay | Admitting: Internal Medicine

## 2016-08-27 DIAGNOSIS — E11311 Type 2 diabetes mellitus with unspecified diabetic retinopathy with macular edema: Secondary | ICD-10-CM

## 2016-08-27 DIAGNOSIS — H35033 Hypertensive retinopathy, bilateral: Secondary | ICD-10-CM

## 2016-08-27 DIAGNOSIS — I10 Essential (primary) hypertension: Secondary | ICD-10-CM | POA: Diagnosis not present

## 2016-08-27 DIAGNOSIS — H43813 Vitreous degeneration, bilateral: Secondary | ICD-10-CM | POA: Diagnosis not present

## 2016-08-27 DIAGNOSIS — H33302 Unspecified retinal break, left eye: Secondary | ICD-10-CM

## 2016-08-27 DIAGNOSIS — E113313 Type 2 diabetes mellitus with moderate nonproliferative diabetic retinopathy with macular edema, bilateral: Secondary | ICD-10-CM

## 2016-08-27 MED ORDER — INSULIN DETEMIR 100 UNIT/ML ~~LOC~~ SOLN: 82.0000 [IU] | Freq: Every day | SUBCUTANEOUS | 5 refills | Status: DC

## 2016-08-28 ENCOUNTER — Other Ambulatory Visit: Payer: Self-pay | Admitting: Internal Medicine

## 2016-08-28 ENCOUNTER — Ambulatory Visit
Admission: RE | Admit: 2016-08-28 | Discharge: 2016-08-28 | Disposition: A | Discharge status: Home / Self Care | Payer: Medicaid Other | Source: Ambulatory Visit | Attending: Internal Medicine | Admitting: Internal Medicine

## 2016-08-28 DIAGNOSIS — R928 Other abnormal and inconclusive findings on diagnostic imaging of breast: Secondary | ICD-10-CM

## 2016-08-28 DIAGNOSIS — N6321 Unspecified lump in the left breast, upper outer quadrant: Secondary | ICD-10-CM | POA: Diagnosis not present

## 2016-09-02 ENCOUNTER — Encounter (HOSPITAL_COMMUNITY): Payer: Self-pay | Admitting: Family Medicine

## 2016-09-02 ENCOUNTER — Ambulatory Visit (HOSPITAL_COMMUNITY)
Admission: EM | Admit: 2016-09-02 | Discharge: 2016-09-02 | Disposition: A | Discharge status: Home / Self Care | Payer: Medicare HMO | Attending: Internal Medicine | Admitting: Internal Medicine

## 2016-09-02 ENCOUNTER — Ambulatory Visit (INDEPENDENT_AMBULATORY_CARE_PROVIDER_SITE_OTHER): Payer: Medicare HMO | Admitting: Podiatry

## 2016-09-02 ENCOUNTER — Encounter: Payer: Self-pay | Admitting: Podiatry

## 2016-09-02 DIAGNOSIS — M79676 Pain in unspecified toe(s): Secondary | ICD-10-CM

## 2016-09-02 DIAGNOSIS — M5432 Sciatica, left side: Secondary | ICD-10-CM | POA: Diagnosis not present

## 2016-09-02 DIAGNOSIS — E1142 Type 2 diabetes mellitus with diabetic polyneuropathy: Secondary | ICD-10-CM | POA: Diagnosis not present

## 2016-09-02 DIAGNOSIS — S29019A Strain of muscle and tendon of unspecified wall of thorax, initial encounter: Secondary | ICD-10-CM

## 2016-09-02 DIAGNOSIS — B351 Tinea unguium: Secondary | ICD-10-CM | POA: Diagnosis not present

## 2016-09-02 MED ORDER — PREDNISONE 50 MG PO TABS: ORAL_TABLET | ORAL | 0 refills | Status: DC

## 2016-09-02 MED ORDER — HYDROCODONE-ACETAMINOPHEN 5-325 MG PO TABS: 1.0000 | ORAL_TABLET | ORAL | 0 refills | Status: DC | PRN

## 2016-09-02 NOTE — Progress Notes (Signed)
Patient ID: Kathleen [name], female   DOB: July 06, 1950, 66 y.o.   MRN: [id number]    Subjective: This patient presents again for schedule visit complaining of painful toenails and walking wearing shoes and is requesting nail debridement. Patient's power of attorney is present in the treatment room Patient lives in assisted living facility  Objective: Orientated 3 Bilateral peripheral pitting edema DP PT pulses 1/4 bilaterally Capillary reflex immediate bilaterally Sensation to 10 g monofilament wire intact 2/5 right 1/5 left Vibratory sensation nonreactive bilaterally Ankle reflex equal and reactive bilaterally Scaling skin bilaterally Varus rotated fourth and fifth toes bilaterally No Open skin lesions bilaterally Atrophic skin with absent hair growth bilaterally The toenails are hypertrophic, elongated, incurvated, discolored and tender direct palpation 6-10 Manual motor testing dorsi flexion, plantar flexion, inversion, eversion 5/5 bilaterally  Assessment: Diabeticwith peripheral neuropathy Decrease pedal pulses suggestive you have possible peripheral arterial disease without any open wounds Symptomatic onychomycoses 6-10  Plan: Debrided toenails 10 mechanically and electrically without any bleeding  Reappoint 3 months

## 2016-09-02 NOTE — ED Triage Notes (Signed)
Pt here for left back pain that radiated down her left leg. sts that she is having trouble lifting leg. Denies injury.

## 2016-09-02 NOTE — ED Provider Notes (Signed)
CSN: [id number]     Arrival date & time 09/02/16  1435 History   First MD Initiated Contact with Patient 09/02/16 1733     Chief Complaint  Patient presents with  . Back Pain  . Leg Pain   (Consider location/radiation/quality/duration/timing/severity/associated sxs/prior Treatment) Pleasant 66 year old obese female complaining of back pain for 3 weeks. The pain is located in the lower left parathoracic musculature. It is worse with bending, leaning forward or sideways, attempting to stand from sitting position and other movements. She also has pain described as sharp that shoots down her left leg. Upon straightening her leg this reproduces the sharp pain in the back. No history of back pain. No history of sciatica or radicular pain. No history of trauma, falls. She does go to exercise classes.      Past Medical History:  Diagnosis Date  . Atrial flutter ([place]) 04/06/2016  . Bradycardia 04/06/2016   type 1 second degree AV block  . Chronic diastolic (congestive) heart failure ([place]) 01/10/2015  . Degenerative joint disease of hand   . Diabetes mellitus   . Dyslipidemia   . Fecal occult blood test positive   . GERD (gastroesophageal reflux disease)   . Headache   . Hypertension   . Inadequate material resources   . Irritable bowel syndrome   . Obesity   . Post-menopausal bleeding   . Postmenopausal   . Shortness of breath dyspnea    multifactorial from obesity, deconditioning, obesity hypoventilation syndrome   Past Surgical History:  Procedure Laterality Date  . CHOLECYSTECTOMY    . EYE SURGERY     left lens implant s/p cataracts  . OTHER SURGICAL HISTORY     right shoulder tendon repair 05/2011   Family History  Problem Relation Age of Onset  . Diabetes Mother   . Obesity Mother   . Stroke Father   . Diabetes Sister   . Colon cancer Neg Hx    Social History  Substance Use Topics  . Smoking status: Never Smoker  . Smokeless tobacco: Never Used  . Alcohol use No    OB History    No data available     Review of Systems  Constitutional: Negative for activity change, chills and fever.  HENT: Negative.   Respiratory: Negative.   Cardiovascular: Negative.   Musculoskeletal: Positive for back pain, gait problem and myalgias. Negative for neck pain and neck stiffness.       As per HPI  Skin: Negative for color change, pallor and rash.  All other systems reviewed and are negative.   Allergies  Doxycycline  Home Medications   Prior to Admission medications   Medication Sig Start Date End Date Taking? Authorizing Provider  ACCU-CHEK FASTCLIX LANCETS MISC TEST BLOOD SUGAR THREE TIMES DAILY 05/12/16   Aldine Contes, MD  amitriptyline (ELAVIL) 50 MG tablet Take 0.5 tablets (25 mg total) by mouth at bedtime. 06/30/16   Aldine Contes, MD  apixaban (ELIQUIS) 5 MG TABS tablet Take 1 tablet (5 mg total) by mouth 2 (two) times daily. 04/06/16   Sueanne Margarita, MD  BD INSULIN SYRINGE ULTRAFINE 31G X 15/64" 1 ML MISC USE TO INJECT INSULIN ONE TIME A DAY 08/26/16   Aldine Contes, MD  Blood Glucose Monitoring Suppl (ACCU-CHEK NANO SMARTVIEW) W/DEVICE KIT Check blood sugar 3 times daily 02/22/15   Aldine Contes, MD  bumetanide (BUMEX) 1 MG tablet TAKE 1 TABLET TWICE DAILY 08/26/16   Aldine Contes, MD  gabapentin (NEURONTIN) 300 MG capsule TAKE  1 CAPSULE in the morning and afternoon and 2 at night 06/30/16   Aldine Contes, MD  glucose blood (ACCU-CHEK SMARTVIEW) test strip TEST BLOOD SUGAR THREE TIMES DAILY ICD-10 E11.9 05/12/16   Aldine Contes, MD  HYDROcodone-acetaminophen (NORCO/VICODIN) 5-325 MG tablet Take 1 tablet by mouth every 4 (four) hours as needed. For pain. May cause drowsiness and balance problems. 09/02/16   Janne Napoleon, NP  insulin detemir (LEVEMIR) 100 UNIT/ML injection Inject 0.82 mLs (82 Units total) into the skin at bedtime. 08/27/16   Aldine Contes, MD  Insulin Pen Needle (PEN NEEDLES) 32G X 5 MM MISC 0.6 mg by Does not apply  route daily. Use 1 time daily to inject victoza. diag code E11.9 insulin dependent 07/01/16   Aldine Contes, MD  liraglutide (VICTOZA) 18 MG/3ML SOPN Inject 0.1 mLs (0.6 mg total) into the skin daily. 06/30/16   Aldine Contes, MD  metFORMIN (GLUCOPHAGE) 1000 MG tablet TAKE 1 TABLET (1,000 MG TOTAL) BY MOUTH 2 (TWO) TIMES DAILY WITH A MEAL. 02/10/16   Aldine Contes, MD  Multiple Vitamins-Minerals (HEALTHY EYES PO) Take by mouth daily. Reported on 05/17/2015    [provider]  NON FORMULARY Place 2 L into the nose daily. Wears with exertion and qhs    [provider]  omeprazole (PRILOSEC) 20 MG capsule TAKE 1 CAPSULE (20 MG TOTAL) BY MOUTH DAILY. 08/24/16   Aldine Contes, MD  predniSONE (DELTASONE) 50 MG tablet 1 tab po daily for 6 days. Take with food. 09/02/16   Janne Napoleon, NP   Meds Ordered and Administered this Visit  Medications - No data to display  BP 135/81   Pulse (!) 52   Temp 98.2 F (36.8 C) (Oral)   Resp 18   SpO2 97%  No data found.   Physical Exam  Constitutional: She is oriented to person, place, and time. She appears well-developed and well-nourished. No distress.  Neck: Neck supple.  Cardiovascular: Normal rate.   Pulmonary/Chest: Effort normal.  Musculoskeletal: She exhibits tenderness. She exhibits no edema or deformity.  Tenderness to the left lower parathoracic musculature as well as the left parasacral musculature. There is also tenderness to the left lateral back at the posterior axillary line. No leg tenderness.  Neurological: She is alert and oriented to person, place, and time.  Skin: Skin is warm and dry.  Psychiatric: She has a normal mood and affect.  Nursing note and vitals reviewed.   Urgent Care Course     Procedures (including critical care time)  Labs Review Labs Reviewed - No data to display  Imaging Review No results found.   Visual Acuity Review  Right Eye Distance:   Left Eye Distance:   Bilateral  Distance:    Right Eye Near:   Left Eye Near:    Bilateral Near:         MDM   1. Sciatica of left side   2. Thoracic myofascial strain, initial encounter    Apply heat to your left back. Perform the gentle small movements and stretches demonstrated. He may start that in a day or 2. Do not go to exercise classes with a strained back. Take the medication as directed. The prednisone may increase your blood sugars. If you find this to be the case increase your Levemir by 3-5 units a day depending on your blood sugars. If your back continues to hurt and the pain continues to run down your leg follow-up with your primary care provider later next week. Meds  ordered this encounter  Medications  . predniSONE (DELTASONE) 50 MG tablet    Sig: 1 tab po daily for 6 days. Take with food.    Dispense:  6 tablet    Refill:  0    Order Specific Question:   Supervising Provider    Answer:   Sherlene Shams [[id number]]  . HYDROcodone-acetaminophen (NORCO/VICODIN) 5-325 MG tablet    Sig: Take 1 tablet by mouth every 4 (four) hours as needed. For pain. May cause drowsiness and balance problems.    Dispense:  15 tablet    Refill:  0    Order Specific Question:   Supervising Provider    Answer:   Sherlene Shams [[id number]]       Janne Napoleon, NP 09/02/16 1756

## 2016-09-02 NOTE — Patient Instructions (Signed)

## 2016-09-02 NOTE — Discharge Instructions (Signed)
Apply heat to your left back. Perform the gentle small movements and stretches demonstrated. He may start that in a day or 2. Do not go to exercise classes with a strained back. Take the medication as directed. The prednisone may increase your blood sugars. If you find this to be the case increase your Levemir by 3-5 units a day depending on your blood sugars. If your back continues to hurt and the pain continues to run down your leg follow-up with your primary care provider later next week.

## 2016-09-14 ENCOUNTER — Other Ambulatory Visit: Payer: Self-pay | Admitting: *Deleted

## 2016-09-15 NOTE — Patient Outreach (Signed)
[facility] [facility]) Care Management [id number]  Kathleen [name] May 29, 1950 [phone] RN Health Coach telephone call to patient.  Hipaa compliance verified. Per patient she has a pinch nerve in her back. Patient stated that she has been unable to go exercise. It hurts to stand up or bend down. She is having difficulty walking.  The left leg hurts the worse. Per patient she is using Biofreeze to rub on her back and leg. per patient her fasting blood sugars IS 207.  Her appetite is good. Patient stated she had been on prednisone and is eating more breads. Patient has agreed to follow up outreach calls.   Current Medications:  Current Outpatient Prescriptions  Medication Sig Dispense Refill  . ACCU-CHEK FASTCLIX LANCETS MISC TEST BLOOD SUGAR THREE TIMES DAILY 306 each 1  . amitriptyline (ELAVIL) 50 MG tablet Take 0.5 tablets (25 mg total) by mouth at bedtime. 30 tablet 2  . apixaban (ELIQUIS) 5 MG TABS tablet Take 1 tablet (5 mg total) by mouth 2 (two) times daily. 60 tablet 11  . BD INSULIN SYRINGE ULTRAFINE 31G X 15/64" 1 ML MISC USE TO INJECT INSULIN ONE TIME A DAY 100 each 5  . Blood Glucose Monitoring Suppl (ACCU-CHEK NANO SMARTVIEW) W/DEVICE KIT Check blood sugar 3 times daily 1 kit 0  . bumetanide (BUMEX) 1 MG tablet TAKE 1 TABLET TWICE DAILY 180 tablet 3  . gabapentin (NEURONTIN) 300 MG capsule TAKE 1 CAPSULE in the morning and afternoon and 2 at night 360 capsule 1  . glucose blood (ACCU-CHEK SMARTVIEW) test strip TEST BLOOD SUGAR THREE TIMES DAILY ICD-10 E11.9 100 each 11  . HYDROcodone-acetaminophen (NORCO/VICODIN) 5-325 MG tablet Take 1 tablet by mouth every 4 (four) hours as needed. For pain. May cause drowsiness and balance problems. 15 tablet 0  . insulin detemir (LEVEMIR) 100 UNIT/ML injection Inject 0.82 mLs (82 Units total) into the skin at bedtime. 30 mL 5  . Insulin Pen Needle (PEN NEEDLES) 32G X 5 MM MISC 0.6 mg by Does not apply route daily. Use 1 time daily to inject  victoza. diag code E11.9 insulin dependent 90 each 6  . liraglutide (VICTOZA) 18 MG/3ML SOPN Inject 0.1 mLs (0.6 mg total) into the skin daily. 3 mL 3  . metFORMIN (GLUCOPHAGE) 1000 MG tablet TAKE 1 TABLET (1,000 MG TOTAL) BY MOUTH 2 (TWO) TIMES DAILY WITH A MEAL. 180 tablet 3  . Multiple Vitamins-Minerals (HEALTHY EYES PO) Take by mouth daily. Reported on 05/17/2015    . NON FORMULARY Place 2 L into the nose daily. Wears with exertion and qhs    . omeprazole (PRILOSEC) 20 MG capsule TAKE 1 CAPSULE (20 MG TOTAL) BY MOUTH DAILY. 90 capsule 1  . predniSONE (DELTASONE) 50 MG tablet 1 tab po daily for 6 days. Take with food. 6 tablet 0   No current facility-administered medications for this visit.     Functional Status:  In your present state of health, do you have any difficulty performing the following activities: 09/14/2016 08/14/2016  Hearing? N N  Vision? N N  Difficulty concentrating or making decisions? N N  Walking or climbing stairs? Y Y  Dressing or bathing? N N  Doing errands, shopping? N N  Preparing Food and eating ? N N  Using the Toilet? N N  In the past six months, have you accidently leaked urine? N N  Do you have problems with loss of bowel control? N N  Managing your Medications? N N  Managing  your Finances? N N  Housekeeping or managing your Housekeeping? N N  Some recent data might be hidden    Fall/Depression Screening: Fall Risk  09/14/2016 08/14/2016 07/15/2016  Falls in the past year? No No No  Number falls in past yr: - 1 -  Injury with Fall? No No -  Risk Factor Category  High Fall Risk High Fall Risk -  Risk for fall due to : Impaired balance/gait;Impaired mobility Impaired balance/gait;Impaired mobility -  Risk for fall due to (comments): - - -  Follow up Falls evaluation completed;Education provided;Falls prevention discussed Falls evaluation completed;Education provided;Falls prevention discussed -   PHQ 2/9 Scores 09/14/2016 08/14/2016 07/15/2016 07/08/2016  06/30/2016 06/17/2016 05/15/2016  PHQ - 2 Score 0 0 0 0 0 0 0   THN CM Care Plan Problem One     Most Recent Value  Care Plan Problem One  Knowledge deficit in self management of diabetes  Role Documenting the Problem One  [place] for Problem One  Active  THN Long Term Goal (31-90 days)  Patient will have a decrease of HGB A1C by one point in 90 days  THN Long Term Goal Start Date  09/15/16  Interventions for Problem One [place] discussed with patient what the  A1C is.  RN discussed how A1C is affected by the blood sugars.  RN sent EMMI information on  Why get A1C checked. RN sent educational material on Know your goal numbers. RN sent educational material on How the A1C helps.RN discussed possible reasons why patient A1C went up. RN Health Coach reiterate goals with each follow up   [facility] CM Short Term Goal #1 Start Date  09/15/16  Interventions for Short Term Goal #1  RN discussed with patient about getting the correct size compression hose. RN sent patient educational material on compression hose, How to order correct size and how to put them on. RN wil follow up with next discussion to see if patient obtained.. RN sent patient a brochure on places to get hose and meaure properly  THN CM Short Term Goal #4 (0-30 days)  Patient will report her blood sugars are under 200 within the next 30 days  THN CM Short Term Goal #4 Start Date  09/14/16  Interventions for Short Term Goal #4  Patient will choose a healthier snack at [facility] to help her blood sugar decrease in am. Patient understand she can do exercise at hs to help blood sugars. Patient understands she can drink water to keep hydrated.      Assessment:  Patient fasting blood sugar was 207 Patient has been unable to exercise due to ortho problem Patient will continue to benefit from [place] telephonic outreach for education and support for diabetes self management.  Plan:  RN discussed eating habits RN  discussed fasting blood sugar range RN sent EMMI educational material on low back pain RN will follow up with patient within the month of June   Audree Schrecengost [place] Care Management [phone]

## 2016-09-16 ENCOUNTER — Encounter: Payer: Self-pay | Admitting: *Deleted

## 2016-09-23 ENCOUNTER — Encounter: Payer: Self-pay | Admitting: *Deleted

## 2016-10-08 ENCOUNTER — Encounter (INDEPENDENT_AMBULATORY_CARE_PROVIDER_SITE_OTHER): Payer: Medicare HMO | Admitting: Ophthalmology

## 2016-10-12 ENCOUNTER — Other Ambulatory Visit: Payer: Self-pay | Admitting: *Deleted

## 2016-10-12 NOTE — Patient Outreach (Signed)
[facility]) Care Management  10/12/2016   Kathleen [name] 08-09-1950 [id number]  RN Health Coach telephone call to patient.  Hipaa compliance verified. Per patient she is doing much better. Per patient her fasting blood sugar is 109.  She stated that her blood sugars had been running over 200 last week.  Patient will have follow up A1C next month. Per patient she has not been able to return to exercise. Per patient her back hurts when she turns quickly. RN discussed medication adherence. Per patient she has not taken the pain pills as much because she doesn't like the way they make her feel. Patient is trying to eat healthier. Per patient with the steriodal medication it made her hungry all the time . Patient agrees to follow up outreach calls.   Current Medications:  Current Outpatient Prescriptions  Medication Sig Dispense Refill  . ACCU-CHEK FASTCLIX LANCETS MISC TEST BLOOD SUGAR THREE TIMES DAILY 306 each 1  . amitriptyline (ELAVIL) 50 MG tablet Take 0.5 tablets (25 mg total) by mouth at bedtime. 30 tablet 2  . apixaban (ELIQUIS) 5 MG TABS tablet Take 1 tablet (5 mg total) by mouth 2 (two) times daily. 60 tablet 11  . BD INSULIN SYRINGE ULTRAFINE 31G X 15/64" 1 ML MISC USE TO INJECT INSULIN ONE TIME A DAY 100 each 5  . Blood Glucose Monitoring Suppl (ACCU-CHEK NANO SMARTVIEW) W/DEVICE KIT Check blood sugar 3 times daily 1 kit 0  . bumetanide (BUMEX) 1 MG tablet TAKE 1 TABLET TWICE DAILY 180 tablet 3  . gabapentin (NEURONTIN) 300 MG capsule TAKE 1 CAPSULE in the morning and afternoon and 2 at night 360 capsule 1  . glucose blood (ACCU-CHEK SMARTVIEW) test strip TEST BLOOD SUGAR THREE TIMES DAILY ICD-10 E11.9 100 each 11  . HYDROcodone-acetaminophen (NORCO/VICODIN) 5-325 MG tablet Take 1 tablet by mouth every 4 (four) hours as needed. For pain. May cause drowsiness and balance problems. 15 tablet 0  . insulin detemir (LEVEMIR) 100 UNIT/ML injection Inject 0.82 mLs (82 Units  total) into the skin at bedtime. 30 mL 5  . Insulin Pen Needle (PEN NEEDLES) 32G X 5 MM MISC 0.6 mg by Does not apply route daily. Use 1 time daily to inject victoza. diag code E11.9 insulin dependent 90 each 6  . liraglutide (VICTOZA) 18 MG/3ML SOPN Inject 0.1 mLs (0.6 mg total) into the skin daily. 3 mL 3  . metFORMIN (GLUCOPHAGE) 1000 MG tablet TAKE 1 TABLET (1,000 MG TOTAL) BY MOUTH 2 (TWO) TIMES DAILY WITH A MEAL. 180 tablet 3  . Multiple Vitamins-Minerals (HEALTHY EYES PO) Take by mouth daily. Reported on 05/17/2015    . NON FORMULARY Place 2 L into the nose daily. Wears with exertion and qhs    . omeprazole (PRILOSEC) 20 MG capsule TAKE 1 CAPSULE (20 MG TOTAL) BY MOUTH DAILY. 90 capsule 1  . predniSONE (DELTASONE) 50 MG tablet 1 tab po daily for 6 days. Take with food. 6 tablet 0   No current facility-administered medications for this visit.     Functional Status:  In your present state of health, do you have any difficulty performing the following activities: 10/12/2016 09/14/2016  Hearing? N N  Vision? N N  Difficulty concentrating or making decisions? N N  Walking or climbing stairs? Y Y  Dressing or bathing? N N  Doing errands, shopping? N N  Preparing Food and eating ? N N  Using the Toilet? N N  In the past six months,  have you accidently leaked urine? N N  Do you have problems with loss of bowel control? N N  Managing your Medications? N N  Managing your Finances? - N  Housekeeping or managing your Housekeeping? N N  Some recent data might be hidden    Fall/Depression Screening: Fall Risk  10/12/2016 09/14/2016 08/14/2016  Falls in the past year? No No No  Number falls in past yr: - - 1  Injury with Fall? - No No  Risk Factor Category  - High Fall Risk High Fall Risk  Risk for fall due to : Impaired balance/gait;Impaired mobility Impaired balance/gait;Impaired mobility Impaired balance/gait;Impaired mobility  Risk for fall due to (comments): - - -  Follow up Falls  evaluation completed;Education provided;Falls prevention discussed Falls evaluation completed;Education provided;Falls prevention discussed Falls evaluation completed;Education provided;Falls prevention discussed   PHQ 2/9 Scores 10/12/2016 09/14/2016 08/14/2016 07/15/2016 07/08/2016 06/30/2016 06/17/2016  PHQ - 2 Score 0 0 0 0 0 0 0   THN CM Care Plan Problem One     Most Recent Value  Care Plan Problem One  Knowledge deficit in self management of diabetes  Role Documenting the Problem One  [place] for Problem One  Active  THN Long Term Goal   Patient will have a decrease of HGB A1C by one point in 90 days  THN Long Term Goal Start Date  10/12/16  Interventions for Problem One [place] discussed with patient what the  A1C is.  RN discussed how A1C is affected by the blood sugars.  RN sent EMMI information on  Why get A1C checked. RN sent educational material on Know your goal numbers. RN sent educational material on How the A1C helps.RN discussed possible reasons why patient A1C went up. RN Health Coach reiterate goals with each follow up   [facility] CM Short Term Goal #1   patient will be able to verbalize obtaining appropiate fitting compression hose within the next 30 days  THN CM Short Term Goal #1 Start Date  10/12/16  Interventions for Short Term Goal #1  RN discussed with patient about getting the correct size compression hose. RN sent patient educational material on compression hose, How to order correct size and how to put them on. RN wil follow up with next discussion to see if patient obtained.. RN sent patient a brochure on places to get hose and meaure properly  [facility] CM Short Term Goal #3  patient will be looking at returning to exercise program within the next 30 days  THN CM Short Term Goal #3 Start Date  10/12/16  Interventions for Short Tern Goal #3  RN discussed walking vs machines till back is beeter. RN discussed keeping hydrated and perventing heat  exhaustion while wal  THN CM Short Term Goal #4  Patient will report her blood sugars are under 200 within the next 30 days  THN CM Short Term Goal #4 Start Date  10/12/16  Interventions for Short Term Goal #4  Patient will choose a healthier snack at [facility] to help her blood sugar decrease in am. Patient understand she can do exercise at hs to help blood sugars. Patient understands she can drink water to keep hydrated.      Assessment:  Patient has not returned to exercising Patient will continue to  benefit from [place] telephonic outreach for education and support for diabetes self management. Plan:  RN discussed walking for exercising RN discussed heat exhaustion  and staying hydrated during walking RN sent educational  Material on heat exhaustion and rehydration RN discussed blood sugar levels to maintain A1C below 7 RN discussed medication adherence RN will follow up within the month of July  Adisen Bennion [place] Management [phone]

## 2016-10-13 ENCOUNTER — Other Ambulatory Visit: Payer: Self-pay | Admitting: Internal Medicine

## 2016-10-13 ENCOUNTER — Encounter: Payer: Self-pay | Admitting: *Deleted

## 2016-10-13 DIAGNOSIS — E1169 Type 2 diabetes mellitus with other specified complication: Secondary | ICD-10-CM

## 2016-10-14 ENCOUNTER — Encounter: Payer: Self-pay | Admitting: Cardiology

## 2016-10-14 ENCOUNTER — Ambulatory Visit (INDEPENDENT_AMBULATORY_CARE_PROVIDER_SITE_OTHER): Payer: Medicare HMO | Admitting: Cardiology

## 2016-10-14 VITALS — BP 118/54 | HR 59 | Ht 60.0 in | Wt 284.0 lb

## 2016-10-14 DIAGNOSIS — I5032 Chronic diastolic (congestive) heart failure: Secondary | ICD-10-CM | POA: Diagnosis not present

## 2016-10-14 DIAGNOSIS — I441 Atrioventricular block, second degree: Secondary | ICD-10-CM | POA: Diagnosis not present

## 2016-10-14 DIAGNOSIS — I4892 Unspecified atrial flutter: Secondary | ICD-10-CM

## 2016-10-14 DIAGNOSIS — I10 Essential (primary) hypertension: Secondary | ICD-10-CM

## 2016-10-14 LAB — BASIC METABOLIC PANEL
BUN/Creatinine Ratio: 21 (ref 12–28)
BUN: 34 mg/dL — ABNORMAL HIGH (ref 8–27)
CALCIUM: 8.6 mg/dL — AB (ref 8.7–10.3)
CHLORIDE: 102 mmol/L (ref 96–106)
CO2: 26 mmol/L (ref 20–29)
Creatinine, Ser: 1.64 mg/dL — ABNORMAL HIGH (ref 0.57–1.00)
GFR calc Af Amer: 38 mL/min/{1.73_m2} — ABNORMAL LOW (ref >59)
GFR calc non Af Amer: 33 mL/min/{1.73_m2} — ABNORMAL LOW (ref >59)
GLUCOSE: 175 mg/dL — AB (ref 65–99)
POTASSIUM: 4.3 mmol/L (ref 3.5–5.2)
Sodium: 143 mmol/L (ref 134–144)

## 2016-10-14 LAB — PRO B NATRIURETIC PEPTIDE: NT-PRO BNP: 1452 pg/mL — AB (ref 0–287)

## 2016-10-14 NOTE — Patient Instructions (Signed)
Medication Instructions:  Your physician recommends that you continue on your current medications as directed. Please refer to the Current Medication list given to you today.   Labwork: TODAY: BMET, BNP  Testing/Procedures: None  Follow-Up: Your physician wants you to follow-up in: 6 months with Dr. Radford Pax. You will receive a reminder letter in the mail two months in advance. If you don't receive a letter, please call our office to schedule the follow-up appointment.   Any Other Special Instructions Will Be Listed Below (If Applicable).     If you need a refill on your cardiac medications before your next appointment, please call your pharmacy.

## 2016-10-14 NOTE — Progress Notes (Signed)
Cardiology Office Note    Date:  10/14/2016   ID:  Kathleen [name], DOB October 06, 1950, MRN [id number]  PCP:  Aldine Contes, MD  Cardiologist:  Fransico Him, MD   Chief Complaint  Patient presents with  . Hypertension  . Congestive Heart Failure    History of Present Illness:  Kathleen [name] is a 66 y.o. female with a history of HTN, morbid obesity, type 2 DM, IBS, dyslipidemia, Obesity hypoventilation syndrome (on home O2 with no OSA by PSG but showed nocturnal hypoxemia) and chronic diastolic CHF.She is here today for followup and is doing well.  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  She has chronic LE edema which is stable.  Occasionally she will have some PND at night but no orthopnea.  She has some dizziness when going from sitting to standing.   Past Medical History:  Diagnosis Date  . Atrial flutter ([place]) 04/06/2016  . Bradycardia 04/06/2016   type 1 second degree AV block  . Chronic diastolic (congestive) heart failure ([place]) 01/10/2015  . Degenerative joint disease of hand   . Diabetes mellitus   . Dyslipidemia   . Fecal occult blood test positive   . GERD (gastroesophageal reflux disease)   . Headache   . Hypertension   . Inadequate material resources   . Irritable bowel syndrome   . Obesity   . Post-menopausal bleeding   . Postmenopausal   . Shortness of breath dyspnea    multifactorial from obesity, deconditioning, obesity hypoventilation syndrome    Past Surgical History:  Procedure Laterality Date  . CHOLECYSTECTOMY    . EYE SURGERY     left lens implant s/p cataracts  . OTHER SURGICAL HISTORY     right shoulder tendon repair 05/2011    Current Medications: Current Meds  Medication Sig  . ACCU-CHEK FASTCLIX LANCETS MISC TEST BLOOD SUGAR THREE TIMES DAILY  . amitriptyline (ELAVIL) 50 MG tablet Take 0.5 tablets (25 mg total) by mouth at bedtime.  Marland Kitchen apixaban  (ELIQUIS) 5 MG TABS tablet Take 1 tablet (5 mg total) by mouth 2 (two) times daily.  . BD INSULIN SYRINGE ULTRAFINE 31G X 15/64" 1 ML MISC USE TO INJECT INSULIN ONE TIME A DAY  . Blood Glucose Monitoring Suppl (ACCU-CHEK NANO SMARTVIEW) W/DEVICE KIT Check blood sugar 3 times daily  . bumetanide (BUMEX) 1 MG tablet TAKE 1 TABLET TWICE DAILY  . gabapentin (NEURONTIN) 300 MG capsule TAKE 1 CAPSULE in the morning and afternoon and 2 at night  . glucose blood (ACCU-CHEK SMARTVIEW) test strip TEST BLOOD SUGAR THREE TIMES DAILY ICD-10 E11.9  . insulin detemir (LEVEMIR) 100 UNIT/ML injection Inject 0.82 mLs (82 Units total) into the skin at bedtime.  . Insulin Pen Needle (PEN NEEDLES) 32G X 5 MM MISC 0.6 mg by Does not apply route daily. Use 1 time daily to inject victoza. diag code E11.9 insulin dependent  . liraglutide (VICTOZA) 18 MG/3ML SOPN Inject 0.1 mLs (0.6 mg total) into the skin daily.  . metFORMIN (GLUCOPHAGE) 1000 MG tablet TAKE 1 TABLET (1,000 MG TOTAL) BY MOUTH 2 (TWO) TIMES DAILY WITH A MEAL.  . Multiple Vitamins-Minerals (HEALTHY EYES PO) Take by mouth daily. Reported on 05/17/2015  . NON FORMULARY Place 2 L into the nose daily. Wears with exertion and qhs  . omeprazole (PRILOSEC) 20 MG capsule TAKE 1 CAPSULE (20 MG TOTAL) BY MOUTH DAILY.  Allergies:   Doxycycline   Social History   Social History  . Marital status: Divorced    Spouse name: N/A  . Number of children: 2  . Years of education: N/A   Occupational History  . unemployed Unemployed   Social History Main Topics  . Smoking status: Never Smoker  . Smokeless tobacco: Never Used  . Alcohol use No  . Drug use: No  . Sexual activity: Not Currently   Other Topics Concern  . None   Social History Narrative   Single.   Divorced x2.   Has 2 children, by two different fathers.   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 May apply for  disability/SSI given her pain in her hands which has made interviewing for jobs difficult.   Never Smoked   Alcohol use-no   Drug use-no   Regular exercise-yes      Financial assistance approved for 100% discount at [facility] and has [facility] card per [employer]   02/18/2010              Family History:  The patient's family history includes Diabetes in her mother and sister; Obesity in her mother; Stroke in her father.   ROS:   Please see the history of present illness.    ROS All other systems reviewed and are negative.  No flowsheet data found.     PHYSICAL EXAM:   VS:  BP (!) 118/54   Pulse (!) 59   Ht 5' (1.524 m)   Wt 284 lb (128.8 kg)   BMI 55.46 kg/m    GEN: Well nourished, well developed, in no acute distress  HEENT: normal  Neck: no JVD, carotid bruits, or masses Cardiac: RRR; no murmurs, rubs, or gallops.  2+edema with chronic venous stasis changes.  Intact distal pulses bilaterally.  Respiratory:  clear to auscultation bilaterally, normal work of breathing GI: soft, nontender, nondistended, + BS MS: no deformity or atrophy  Skin: warm and dry, no rash Neuro:  Alert and Oriented x 3, Strength and sensation are intact Psych: euthymic mood, full affect  Wt Readings from Last 3 Encounters:  10/14/16 284 lb (128.8 kg)  07/08/16 284 lb (128.8 kg)  06/30/16 284 lb 14.4 oz (129.2 kg)      Studies/Labs Reviewed:   EKG:  EKG is not ordered today.    Recent Labs: 02/14/2016: ALT 11 04/06/2016: Hemoglobin 12.7; Platelets 182 07/08/2016: BUN 40; Creatinine, Ser 1.55; Potassium 4.1; Sodium 145   Lipid Panel    Component Value Date/Time   CHOL 128 06/30/2016 1154   TRIG 77 06/30/2016 1154   HDL 42 06/30/2016 1154   CHOLHDL 3.0 06/30/2016 1154   CHOLHDL 3.5 06/07/2014 1027   VLDL 16 06/07/2014 1027   LDLCALC 71 06/30/2016 1154    Additional studies/ records that were reviewed today include:  none    ASSESSMENT:    1. Chronic diastolic congestive heart  failure ([place])   2. Mobitz type 1 second degree atrioventricular block   3. Essential hypertension   4. Atrial flutter, unspecified type ([place])      PLAN:  In order of problems listed above:  1. Chronic diastolic CHF - her weight is stable. She seems to have more LE edema on exam today and has had some problems with PND at night even with O2 on. I will check a BNP. Unfortunately she has not been able to tolerate  compression hose.  I will check a BMET today as well.  Her Bumex does was recently decreased due to increased creatinine.  If renal function stable will increase Bumex to 61m daily.  I have encouraged her to follow a 2gm sodium diet and fluid restrict to 2L fluid daily.  I encouraged her to stop diet soda as well.  2. HTN - BP well  controlled today on exam.  She is currently not on any antihypertensive meds.  3. Mobitz Type I second degree AV block - asymptomatic  4. Permanent atrial flutter with SVR - she is asymptomatic.  Her CHAD2VASC score is 3 and she is on Eliquis 558mBID.  Echo showed normal LVF with mild AR and mild biatrial enlargement. She was seen in afib clinic and as she is asymptomatic it was recommended that we pursue rate control as she has AV conduction issues which preclude use of typical drugs to maintain NSR. She has not had any problems with bleeding on the NOAC.  Medication Adjustments/Labs and Tests Ordered: Current medicines are reviewed at length with the patient today.  Concerns regarding medicines are outlined above.  Medication changes, Labs and Tests ordered today are listed in the Patient Instructions below.  Patient Instructions  Medication Instructions:  Your physician recommends that you continue on your current medications as directed. Please refer to the Current Medication list given to you today.   Labwork: TODAY: BMET, BNP  Testing/Procedures: None  Follow-Up: Your physician wants you to follow-up in: 6 months with Dr. TuRadford PaxYou will  receive a reminder letter in the mail two months in advance. If you don't receive a letter, please call our office to schedule the follow-up appointment.   Any Other Special Instructions Will Be Listed Below (If Applicable).     If you need a refill on your cardiac medications before your next appointment, please call your pharmacy.      Signed, TrFransico HimMD  10/14/2016 10:48 AM    [email or web address]  [id number]: [phone]: [phone]

## 2016-10-15 ENCOUNTER — Telehealth: Payer: Self-pay | Admitting: Cardiology

## 2016-10-15 DIAGNOSIS — I5032 Chronic diastolic (congestive) heart failure: Secondary | ICD-10-CM

## 2016-10-15 MED ORDER — BUMETANIDE 1 MG PO TABS: ORAL_TABLET | ORAL | 3 refills | Status: DC

## 2016-10-15 NOTE — Telephone Encounter (Signed)
Informed patient of results and verbal understanding expressed.  Instructed patient to INCREASE BUMEX to 2 mg BID for 2 days then 2 mg qAM and 1 mg qPM. Patient refuses to come to appointment until next Thursday. Scheduled patient with Melina Copa 6/28. She understands she will have labs drawn that day. She understands to call if symptoms occur prior to appointment.

## 2016-10-15 NOTE — Telephone Encounter (Signed)
-----   Message from Sueanne Margarita, MD sent at 10/14/2016  4:47 PM EDT ----- Creatinine increased but suspect due to worsening CHF as BNP is elevated.  Please have patient increase Bumex to 2mg  BID for 2 days then decrease to 2mg  qam and 1mg  qpm and have her followup on Friday with extender and get stat BMET that day as well

## 2016-10-15 NOTE — Telephone Encounter (Signed)
F/u Message ° °Pt returning RN call. Please call back to discuss  °

## 2016-10-19 ENCOUNTER — Telehealth: Payer: Self-pay | Admitting: Cardiology

## 2016-10-19 NOTE — Telephone Encounter (Signed)
Spoke with pt and advised her that Dr. Radford Pax had wanted her to take Bumex 2mg  BID x 2 days and then decrease it to Bumex 2mg  qAM and 1mg  qPM.  Advised pt she needed to continue this dose until she is seen in the office this week and adjustments can be made if needed.  Pt verbalized understanding and was in agreement with this plan.

## 2016-10-19 NOTE — Telephone Encounter (Signed)
New message    Pt is calling.   Pt c/o medication issue:  1. Name of Medication: bumex 1 mg  2. How are you currently taking this medication (dosage and times per day)? Twice day  3. Are you having a reaction (difficulty breathing--STAT)? No   4. What is your medication issue? Pt is asking if she needs to take 2 in the morning and 2 at night until she is seen or if she goes back to one twice a day

## 2016-10-20 ENCOUNTER — Encounter: Payer: Self-pay | Admitting: Internal Medicine

## 2016-10-20 ENCOUNTER — Ambulatory Visit (INDEPENDENT_AMBULATORY_CARE_PROVIDER_SITE_OTHER): Payer: Medicare HMO | Admitting: Internal Medicine

## 2016-10-20 VITALS — BP 143/60 | HR 60 | Temp 98.2°F | Ht 60.0 in | Wt 282.2 lb

## 2016-10-20 DIAGNOSIS — G6289 Other specified polyneuropathies: Secondary | ICD-10-CM

## 2016-10-20 DIAGNOSIS — E1142 Type 2 diabetes mellitus with diabetic polyneuropathy: Secondary | ICD-10-CM | POA: Diagnosis not present

## 2016-10-20 DIAGNOSIS — E1169 Type 2 diabetes mellitus with other specified complication: Secondary | ICD-10-CM

## 2016-10-20 DIAGNOSIS — I5032 Chronic diastolic (congestive) heart failure: Secondary | ICD-10-CM | POA: Diagnosis not present

## 2016-10-20 DIAGNOSIS — Z79899 Other long term (current) drug therapy: Secondary | ICD-10-CM

## 2016-10-20 DIAGNOSIS — N183 Chronic kidney disease, stage 3 unspecified: Secondary | ICD-10-CM

## 2016-10-20 DIAGNOSIS — E1122 Type 2 diabetes mellitus with diabetic chronic kidney disease: Secondary | ICD-10-CM

## 2016-10-20 DIAGNOSIS — I10 Essential (primary) hypertension: Secondary | ICD-10-CM

## 2016-10-20 DIAGNOSIS — I130 Hypertensive heart and chronic kidney disease with heart failure and stage 1 through stage 4 chronic kidney disease, or unspecified chronic kidney disease: Secondary | ICD-10-CM | POA: Diagnosis not present

## 2016-10-20 DIAGNOSIS — Z794 Long term (current) use of insulin: Secondary | ICD-10-CM | POA: Diagnosis not present

## 2016-10-20 LAB — POCT GLYCOSYLATED HEMOGLOBIN (HGB A1C): HEMOGLOBIN A1C: 8.2

## 2016-10-20 LAB — GLUCOSE, CAPILLARY: Glucose-Capillary: 74 mg/dL (ref 65–99)

## 2016-10-20 MED ORDER — LIRAGLUTIDE 18 MG/3ML ~~LOC~~ SOPN: 0.6000 mg | PEN_INJECTOR | Freq: Every day | SUBCUTANEOUS | 3 refills | Status: DC

## 2016-10-20 MED ORDER — LIRAGLUTIDE 18 MG/3ML ~~LOC~~ SOPN: 1.2000 mg | PEN_INJECTOR | Freq: Every day | SUBCUTANEOUS | 3 refills | Status: DC

## 2016-10-20 MED ORDER — DICLOFENAC SODIUM 1 % TD GEL: 4.0000 g | Freq: Four times a day (QID) | TRANSDERMAL | 2 refills | Status: DC

## 2016-10-20 NOTE — Patient Instructions (Addendum)
-   It was a pleasure seeing you today - Please follow up with your heart doctor this week for repeat blood work - Your A1C has slightly improved to 8.2. I have increased your victoza to 1.2 mg daily - Please call if you need refills on anything - Happy Birthday!! Have a great day!

## 2016-10-20 NOTE — Assessment & Plan Note (Addendum)
-   Patient saw her cardiologist last week and was noted to have increased lower extremity edema as well as some abdominal distention and an elevated BNP and a mildly increased creatinine which was consistent with an exacerbation of chronic diastolic heart failure - She was started on an increased dose of Bumex by her cardiologist and will follow-up with them in 2 days for repeat BMP as well as an assessment of her status - She feels well today and has decreased lower extremity edema per her reporting - I'll continue with the dose of Bumex per her cardiologist and follow-up her BMP from her cardiologist's office

## 2016-10-20 NOTE — Assessment & Plan Note (Signed)
-   Patient states she is compliant with the gabapentin and amitriptyline -  she states her symptoms are better controlled on her increased dose of gabapentin 300 mg (1 tablet in the morning, 1 tablet at dinner and 2 at night) - We'll continue to monitor

## 2016-10-20 NOTE — Assessment & Plan Note (Signed)
Lab Results  Component Value Date   HGBA1C 8.2 10/20/2016   HGBA1C 8.3 06/30/2016   HGBA1C 7.7 03/24/2016     Assessment: Diabetes control:  fair Progress toward A1C goal:   improved Comments: Patient is compliant with her insulin (Levemir 82 units) as well as her Victoza 0.6 mg daily and metformin 1000 mg twice daily  Plan: Medications:  Will increase her Victoza to 1.2 mg daily Home glucose monitoring: Frequency:   Timing:   Instruction/counseling given: reminded to bring blood glucose meter & log to each visit and discussed the need for weight loss Educational resources provided: brochure (denies need ) Self management tools provided: copy of home glucose meter download Other plans: Patient had diabetic foot exam done today

## 2016-10-20 NOTE — Assessment & Plan Note (Signed)
BP Readings from Last 3 Encounters:  10/20/16 (!) 143/60  10/14/16 (!) 118/54  09/02/16 135/81    Lab Results  Component Value Date   NA 143 10/14/2016   K 4.3 10/14/2016   CREATININE 1.64 (H) 10/14/2016    Assessment: Blood pressure control:  fair Progress toward BP goal:   deteriorated Comments: Patient is compliant with her bumetanide  Plan: Medications:  continue current medications Educational resources provided: brochure (denies need ) Self management tools provided:   Other plans: Patient to get repeat BMP in 2 days

## 2016-10-20 NOTE — Assessment & Plan Note (Addendum)
-   Her creatinine was mildly elevated on her last BMP check with the cardiologist last week - She is now on an increased dose of Bumex and will get a repeat BMP in 2 days at her cardiologist's office - We'll follow-up her BMP - She is unable to tolerate an ACE inhibitor secondary to ACE inhibitor induced cough. She has had issues with hyperkalemia and AKI while taking an ARB (losartan). Would consider trying to reinitiate losartan once her creatinine stabilizes.

## 2016-10-20 NOTE — Progress Notes (Signed)
   Subjective:    Patient ID: Kathleen [name], female    DOB: 04-22-1951, 66 y.o.   MRN: [id number]  HPI  I have seen and examined this patient. She is here for routine follow-up of her diabetes and chronic diastolic congestive heart failure.  Patient states she feels well today and has no new complaints. She went to see her cardiologist last week and had increased lower extremity edema as well as an elevated BNP and a mildly elevated creatinine and was started on an increased dose of her Bumex for likely exacerbation of her chronic diastolic heart failure. She states that since she started her increased dose her swelling has gone down and she feels better.  Review of Systems  Constitutional: Negative.   HENT: Negative.   Respiratory: Negative.   Cardiovascular: Positive for leg swelling.  Gastrointestinal: Negative.   Musculoskeletal: Negative.   Neurological: Negative.   Psychiatric/Behavioral: Negative.        Objective:   Physical Exam  Constitutional: She is oriented to person, place, and time. She appears well-developed and well-nourished.  HENT:  Head: Normocephalic and atraumatic.  Mouth/Throat: No oropharyngeal exudate.  Neck: Neck supple.  Cardiovascular: Normal rate, regular rhythm and normal heart sounds.   Pulmonary/Chest: Effort normal and breath sounds normal. No respiratory distress. She has no wheezes.  Abdominal: Soft. Bowel sounds are normal. She exhibits no distension. There is no tenderness.  Musculoskeletal: Normal range of motion. She exhibits edema.  1-2+ bilateral lower extremity pitting edema noted  Lymphadenopathy:    She has no cervical adenopathy.  Neurological: She is alert and oriented to person, place, and time.  Skin: Skin is warm. No erythema.  Psychiatric: She has a normal mood and affect. Her behavior is normal.          Assessment & Plan:   Please see problem-based charting for assessment and plan:

## 2016-10-21 ENCOUNTER — Encounter (INDEPENDENT_AMBULATORY_CARE_PROVIDER_SITE_OTHER): Payer: Medicare HMO | Admitting: Ophthalmology

## 2016-10-21 DIAGNOSIS — H33302 Unspecified retinal break, left eye: Secondary | ICD-10-CM | POA: Diagnosis not present

## 2016-10-21 DIAGNOSIS — E11311 Type 2 diabetes mellitus with unspecified diabetic retinopathy with macular edema: Secondary | ICD-10-CM | POA: Diagnosis not present

## 2016-10-21 DIAGNOSIS — E113313 Type 2 diabetes mellitus with moderate nonproliferative diabetic retinopathy with macular edema, bilateral: Secondary | ICD-10-CM

## 2016-10-21 DIAGNOSIS — H43813 Vitreous degeneration, bilateral: Secondary | ICD-10-CM | POA: Diagnosis not present

## 2016-10-21 DIAGNOSIS — I10 Essential (primary) hypertension: Secondary | ICD-10-CM

## 2016-10-21 DIAGNOSIS — H35033 Hypertensive retinopathy, bilateral: Secondary | ICD-10-CM

## 2016-10-21 NOTE — Progress Notes (Signed)
Cardiology Office Note:    Date:  10/22/2016   ID:  Kathleen [name], DOB 08/03/1950, MRN [id number]  PCP:  Aldine Contes, MD  Cardiologist:  Dr. Radford Pax  Referring MD: Aldine Contes, MD   Chief Complaint  Patient presents with  . Follow-up    fluid overload    History of Present Illness:    Kathleen [name] is a 66 y.o. female with a hx of Hypertension, morbid obesity, type 2 diabetes, IBS, dyslipidemia, obesity hypoventilation syndrome(on home O2 with no OSA by PSG but showed nocturnal hypoxemia), CK D stage III and chronic diastolic CHF. The patient was seen on 10/14/2016 by Dr. Radford Pax and was noted to have stable chronic lower extremity edema, occasional PND but no orthopnea. She also admitted to some dizziness when going from sitting to standing. A BMP was checked and her creatinine level was 1.64 just stable for her and she was found to have an elevated BNP of 1452. With her increase in edema, PND and elevated BNP the patient was advised to take Bumex 2 mg twice a day for 2 days then Bumex 2 mg every morning and 1 mg every afternoon. She is here today for reevaluation of heart failure.  The patient was seen by Dr.Narenda, her PCP, on 6/26 with plans for cardiology to manage diuresis. Today Kathleen [name] reports that her lower extremity edema9 Which is chronic) has improved along with her breathing since last week.   The patient also has permanent atrial flutter for which she is asymptomatic. She has been seen in the atrial fib clinic and as she is asymptomatic was recommended that rate control be pursued as she has AV conduction issues which preclude use of typical drugs to maintain normal sinus rhythm. She has a CHA2DS2/VAS Stroke Risk Score of 3 and is on Eliquis 5 mg twice a day.  Past Medical History:  Diagnosis Date  . Atrial flutter ([place]) 04/06/2016  . Bradycardia 04/06/2016   type 1 second degree AV block  . Chronic diastolic (congestive) heart failure ([place]) 01/10/2015  .  Degenerative joint disease of hand   . Diabetes mellitus   . Dyslipidemia   . Fecal occult blood test positive   . GERD (gastroesophageal reflux disease)   . Headache   . Hypertension   . Inadequate material resources   . Irritable bowel syndrome   . Obesity   . Post-menopausal bleeding   . Postmenopausal   . Shortness of breath dyspnea    multifactorial from obesity, deconditioning, obesity hypoventilation syndrome    Past Surgical History:  Procedure Laterality Date  . CHOLECYSTECTOMY    . EYE SURGERY     left lens implant s/p cataracts  . OTHER SURGICAL HISTORY     right shoulder tendon repair 05/2011    Current Medications: Current Meds  Medication Sig  . ACCU-CHEK FASTCLIX LANCETS MISC TEST BLOOD SUGAR THREE TIMES DAILY  . amitriptyline (ELAVIL) 50 MG tablet Take 0.5 tablets (25 mg total) by mouth at bedtime.  Marland Kitchen apixaban (ELIQUIS) 5 MG TABS tablet Take 1 tablet (5 mg total) by mouth 2 (two) times daily.  . BD INSULIN SYRINGE ULTRAFINE 31G X 15/64" 1 ML MISC USE TO INJECT INSULIN ONE TIME A DAY  . Blood Glucose Monitoring Suppl (ACCU-CHEK NANO SMARTVIEW) W/DEVICE KIT Check blood sugar 3 times daily  . bumetanide (BUMEX) 1 MG tablet Take 2 mg (two tablets) in the AM. Take 1 mg (1 tablet) at night.  . diclofenac sodium (VOLTAREN)  1 % GEL Apply 4 g topically 4 (four) times daily.  Marland Kitchen gabapentin (NEURONTIN) 300 MG capsule TAKE 1 CAPSULE in the morning and afternoon and 2 at night  . glucose blood (ACCU-CHEK SMARTVIEW) test strip TEST BLOOD SUGAR THREE TIMES DAILY ICD-10 E11.9  . insulin detemir (LEVEMIR) 100 UNIT/ML injection Inject 0.82 mLs (82 Units total) into the skin at bedtime.  . Insulin Pen Needle (PEN NEEDLES) 32G X 5 MM MISC 0.6 mg by Does not apply route daily. Use 1 time daily to inject victoza. diag code E11.9 insulin dependent  . liraglutide (VICTOZA) 18 MG/3ML SOPN Inject 0.2 mLs (1.2 mg total) into the skin daily.  . metFORMIN (GLUCOPHAGE) 1000 MG tablet TAKE 1  TABLET (1,000 MG TOTAL) BY MOUTH 2 (TWO) TIMES DAILY WITH A MEAL.  . Multiple Vitamins-Minerals (HEALTHY EYES PO) Take by mouth daily. Reported on 05/17/2015  . NON FORMULARY Place 2 L into the nose daily. Wears with exertion and qhs  . omeprazole (PRILOSEC) 20 MG capsule TAKE 1 CAPSULE (20 MG TOTAL) BY MOUTH DAILY.     Allergies:   Doxycycline   Social History   Social History  . Marital status: Divorced    Spouse name: N/A  . Number of children: 2  . Years of education: N/A   Occupational History  . unemployed Unemployed   Social History Main Topics  . Smoking status: Never Smoker  . Smokeless tobacco: Never Used  . Alcohol use No  . Drug use: No  . Sexual activity: Not Currently   Other Topics Concern  . None   Social History Narrative   Single.   Divorced x2.   Has 2 children, by two different fathers.   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 May apply for disability/SSI given her pain in her hands which has made interviewing for jobs difficult.   Never Smoked   Alcohol use-no   Drug use-no   Regular exercise-yes      Financial assistance approved for 100% discount at [facility] and has [facility] card per [employer]   02/18/2010              Family History: The patient's family history includes Diabetes in her mother and sister; Obesity in her mother; Stroke in her father. There is no history of Colon cancer. ROS:   Please see the history of present illness.    All other systems reviewed and are negative.  EKGs/Labs/Other Studies Reviewed:    The following studies were reviewed today:  Echocardiogram 04/28/2016 Study Conclusions  - Left ventricle: The cavity size was normal. Systolic function was   normal. The estimated ejection fraction was in the range of 55%   to 60%. Wall motion was normal; there were no regional wall   motion abnormalities. - Aortic valve: There was mild  regurgitation directed centrally in   the LVOT. - Left atrium: The atrium was mildly dilated. - Right atrium: The atrium was mildly dilated.  Outpatient monitoring 09/2015 Study Highlights    Normal sinus rhythm, sinus bradycardia with HR ranging from 40 to 80bpm.  Second degree type I AV block - Wenkebach block  Junctional bradycardia and junctional rhythm.  PVCs and ventricular couplets    EKG:  EKG is not ordered today.   Pt in slow atrial   Recent Labs: 02/14/2016: ALT 11 04/06/2016: Hemoglobin 12.7; Platelets 182 10/14/2016: BUN 34; Creatinine, Ser 1.64; NT-Pro  BNP 1,452; Potassium 4.3; Sodium 143   Recent Lipid Panel    Component Value Date/Time   CHOL 128 06/30/2016 1154   TRIG 77 06/30/2016 1154   HDL 42 06/30/2016 1154   CHOLHDL 3.0 06/30/2016 1154   CHOLHDL 3.5 06/07/2014 1027   VLDL 16 06/07/2014 1027   LDLCALC 71 06/30/2016 1154    Physical Exam:    VS:  BP 120/60   Pulse (!) 52   Ht 5' (1.524 m)   Wt 279 lb 1.9 oz (126.6 kg)   SpO2 93%   BMI 54.51 kg/m     Wt Readings from Last 3 Encounters:  10/22/16 279 lb 1.9 oz (126.6 kg)  10/20/16 282 lb 3.2 oz (128 kg)  10/14/16 284 lb (128.8 kg)     GEN: Well nourished, well developed obese female in no acute distress HEENT: Normal NECK: No JVD; No carotid bruits LYMPHATICS: No lymphadenopathy CARDIAC: RRR, no murmurs, rubs, gallops, bradycardic RESPIRATORY:  Clear to auscultation without rales, wheezing or rhonchi  ABDOMEN: Soft, non-tender, non-distended MUSCULOSKELETAL:  No edema; No deformity  SKIN: Warm and dry NEUROLOGIC:  Alert and oriented x 3 PSYCHIATRIC:  Normal affect   ASSESSMENT:    1. Chronic diastolic congestive heart failure ([place])   2. Essential (primary) hypertension   3. Type 2 diabetes mellitus without complication, with long-term current use of insulin ([place])   4. Atrial flutter, unspecified type ([facility])   5. Class 3 severe obesity due to excess calories with serious comorbidity  and body mass index (BMI) of 50.0 to 59.9 in adult [facility])    PLAN:    In order of problems listed above:  Chronic diastolic heart failure -Pt seen on 6/20 and noted to have increased lower extremity edema and mild increase in DOE -Has been taking Bumex 101m bid for 2 days then 2 mg am and 1 mg pm. -LE edema is present (is chronic) but improved since last week per pt. Breathing is slightly improved and now at baseline.  -Will check Bmet today. Continue current Bumex 2 mg am and 1 mg pm unless labs indicate otherwise.  -2000 mg sodium diet and 2 liter fluid restriction, avoid soda per instruction from Dr. TRadford Pax Pt states that she is trying. She has completely stopped sodas.   Hypertension -Currently well controlled on no medications  Diabetes type 2 on insulin -A1c 8.2 on 10/20/16 which is improved control for her. -Followed by Dr. NLysbeth Galas Permanent atrial flutter with slow ventricular response -Pt asymptomatic  Morbid obesity -BMI 54 -Discussed portion control and eating out.  Pt wants to start Weight Watchers with her sister. She walks for exercise at [employer] and outside if weather permits, a couple times per week. Back pain limits her activity.    Medication Adjustments/Labs and Tests Ordered: Current medicines are reviewed at length with the patient today.  Concerns regarding medicines are outlined above. Labs and tests ordered and medication changes are outlined in the patient instructions below:  Patient Instructions  .Medication Instructions:  Your physician recommends that you continue on your current medications as directed. Please refer to the Current Medication list given to you today.   Labwork: TODAY:  BMET  Testing/Procedures: None ordered  Follow-Up: Your physician recommends that you schedule a follow-up appointment in: 2 MONTHS WITH DR. TRadford Pax  Any Other Special Instructions Will Be Listed Below (If Applicable). DO NOT DRINK MORE THAN 628OZ FLUID IN A  DAY  Your physician recommends that you weigh,  daily, at the same time every day, and in the same amount of clothing. If you notice a 3 lb weight gain in 24 hours, or more than 5 lbs in a week, call our office.    DASH Eating Plan DASH stands for "Dietary Approaches to Stop Hypertension." The DASH eating plan is a healthy eating plan that has been shown to reduce high blood pressure (hypertension). It may also reduce your risk for type 2 diabetes, heart disease, and stroke. The DASH eating plan may also help with weight loss. What are tips for following this plan? General guidelines  Avoid eating more than 2,000 mg (milligrams) of salt (sodium) a day. If you have hypertension, you may need to reduce your sodium intake to 1,500 mg a day.  Limit alcohol intake to no more than 1 drink a day for nonpregnant women and 2 drinks a day for men. One drink equals 12 oz of beer, 5 oz of wine, or 1 oz of hard liquor.  Work with your health care provider to maintain a healthy body weight or to lose weight. Ask what an ideal weight is for you.  Get at least 30 minutes of exercise that causes your heart to beat faster (aerobic exercise) most days of the week. Activities may include walking, swimming, or biking.  Work with your health care provider or diet and nutrition specialist (dietitian) to adjust your eating plan to your individual calorie needs. Reading food labels  Check food labels for the amount of sodium per serving. Choose foods with less than 5 percent of the Daily Value of sodium. Generally, foods with less than 300 mg of sodium per serving fit into this eating plan.  To find whole grains, look for the word "whole" as the first word in the ingredient list. Shopping  Buy products labeled as "low-sodium" or "no salt added."  Buy fresh foods. Avoid canned foods and premade or frozen meals. Cooking  Avoid adding salt when cooking. Use salt-free seasonings or herbs instead of table salt or  sea salt. Check with your health care provider or pharmacist before using salt substitutes.  Do not fry foods. Cook foods using healthy methods such as baking, boiling, grilling, and broiling instead.  Cook with heart-healthy oils, such as olive, canola, soybean, or sunflower oil. Meal planning   Eat a balanced diet that includes: ? 5 or more servings of fruits and vegetables each day. At each meal, try to fill half of your plate with fruits and vegetables. ? Up to 6-8 servings of whole grains each day. ? Less than 6 oz of lean meat, poultry, or fish each day. A 3-oz serving of meat is about the same size as a deck of cards. One egg equals 1 oz. ? 2 servings of low-fat dairy each day. ? A serving of nuts, seeds, or beans 5 times each week. ? Heart-healthy fats. Healthy fats called Omega-3 fatty acids are found in foods such as flaxseeds and coldwater fish, like sardines, salmon, and mackerel.  Limit how much you eat of the following: ? Canned or prepackaged foods. ? Food that is high in trans fat, such as fried foods. ? Food that is high in saturated fat, such as fatty meat. ? Sweets, desserts, sugary drinks, and other foods with added sugar. ? Full-fat dairy products.  Do not salt foods before eating.  Try to eat at least 2 vegetarian meals each week.  Eat more home-cooked food and less restaurant, buffet, and fast food.  When eating at a restaurant, ask that your food be prepared with less salt or no salt, if possible. What foods are recommended? The items listed may not be a complete list. Talk with your dietitian about what dietary choices are best for you. Grains Whole-grain or whole-wheat bread. Whole-grain or whole-wheat pasta. Brown rice. [place]. Bulgur. Whole-grain and low-sodium cereals. Pita bread. Low-fat, low-sodium crackers. Whole-wheat flour tortillas. Vegetables Fresh or frozen vegetables (raw, steamed, roasted, or grilled). Low-sodium or reduced-sodium  tomato and vegetable juice. Low-sodium or reduced-sodium tomato sauce and tomato paste. Low-sodium or reduced-sodium canned vegetables. Fruits All fresh, dried, or frozen fruit. Canned fruit in natural juice (without added sugar). Meat and other protein foods Skinless chicken or [place]. Ground chicken or [place]. Pork with fat trimmed off. Fish and seafood. Egg whites. Dried beans, peas, or lentils. Unsalted nuts, nut butters, and seeds. Unsalted canned beans. Lean cuts of beef with fat trimmed off. Low-sodium, lean deli meat. Dairy Low-fat (1%) or fat-free (skim) milk. Fat-free, low-fat, or reduced-fat cheeses. Nonfat, low-sodium ricotta or cottage cheese. Low-fat or nonfat yogurt. Low-fat, low-sodium cheese. Fats and oils Soft margarine without trans fats. Vegetable oil. Low-fat, reduced-fat, or light mayonnaise and salad dressings (reduced-sodium). Canola, safflower, olive, soybean, and sunflower oils. Avocado. Seasoning and other foods Herbs. Spices. Seasoning mixes without salt. Unsalted popcorn and pretzels. Fat-free sweets. What foods are not recommended? The items listed may not be a complete list. Talk with your dietitian about what dietary choices are best for you. Grains Baked goods made with fat, such as croissants, muffins, or some breads. Dry pasta or rice meal packs. Vegetables Creamed or fried vegetables. Vegetables in a cheese sauce. Regular canned vegetables (not low-sodium or reduced-sodium). Regular canned tomato sauce and paste (not low-sodium or reduced-sodium). Regular tomato and vegetable juice (not low-sodium or reduced-sodium). Angie Fava. Olives. Fruits Canned fruit in a light or heavy syrup. Fried fruit. Fruit in cream or butter sauce. Meat and other protein foods Fatty cuts of meat. Ribs. Fried meat. Berniece Salines. Sausage. Bologna and other processed lunch meats. Salami. Fatback. Hotdogs. Bratwurst. Salted nuts and seeds. Canned beans with added salt. Canned or smoked fish.  Whole eggs or egg yolks. Chicken or [place] with skin. Dairy Whole or 2% milk, cream, and half-and-half. Whole or full-fat cream cheese. Whole-fat or sweetened yogurt. Full-fat cheese. Nondairy creamers. Whipped toppings. Processed cheese and cheese spreads. Fats and oils Butter. Stick margarine. Lard. Shortening. Ghee. Bacon fat. Tropical oils, such as coconut, palm kernel, or palm oil. Seasoning and other foods Salted popcorn and pretzels. Onion salt, garlic salt, seasoned salt, table salt, and sea salt. Worcestershire sauce. Tartar sauce. Barbecue sauce. Teriyaki sauce. Soy sauce, including reduced-sodium. Steak sauce. Canned and packaged gravies. Fish sauce. Oyster sauce. Cocktail sauce. Horseradish that you find on the shelf. Ketchup. Mustard. Meat flavorings and tenderizers. Bouillon cubes. Hot sauce and Tabasco sauce. Premade or packaged marinades. Premade or packaged taco seasonings. Relishes. Regular salad dressings. Where to find more information:  National Heart, Lung, and [place]: https://wilson-eaton.com/  American Heart Association: www.heart.org Summary  The DASH eating plan is a healthy eating plan that has been shown to reduce high blood pressure (hypertension). It may also reduce your risk for type 2 diabetes, heart disease, and stroke.  With the DASH eating plan, you should limit salt (sodium) intake to 2,300 mg a day. If you have hypertension, you may need to reduce your sodium intake to 1,500 mg a day.  When on the DASH eating plan,  aim to eat more fresh fruits and vegetables, whole grains, lean proteins, low-fat dairy, and heart-healthy fats.  Work with your health care provider or diet and nutrition specialist (dietitian) to adjust your eating plan to your individual calorie needs. This information is not intended to replace advice given to you by your health care provider. Make sure you discuss any questions you have with your health care provider. Document Released:  04/02/2011 Document Revised: 04/06/2016 Document Reviewed: 04/06/2016 Elsevier Interactive Patient Education  2017 [place], Daune Perch, NP  10/22/2016 1:44 PM    [facility]

## 2016-10-22 ENCOUNTER — Encounter: Payer: Self-pay | Admitting: Cardiology

## 2016-10-22 ENCOUNTER — Ambulatory Visit (INDEPENDENT_AMBULATORY_CARE_PROVIDER_SITE_OTHER): Payer: Medicare HMO | Admitting: Cardiology

## 2016-10-22 ENCOUNTER — Telehealth: Payer: Self-pay

## 2016-10-22 VITALS — BP 120/60 | HR 52 | Ht 60.0 in | Wt 279.1 lb

## 2016-10-22 DIAGNOSIS — I4892 Unspecified atrial flutter: Secondary | ICD-10-CM | POA: Diagnosis not present

## 2016-10-22 DIAGNOSIS — Z794 Long term (current) use of insulin: Secondary | ICD-10-CM | POA: Diagnosis not present

## 2016-10-22 DIAGNOSIS — E119 Type 2 diabetes mellitus without complications: Secondary | ICD-10-CM | POA: Diagnosis not present

## 2016-10-22 DIAGNOSIS — Z6841 Body Mass Index (BMI) 40.0 and over, adult: Secondary | ICD-10-CM | POA: Diagnosis not present

## 2016-10-22 DIAGNOSIS — I5032 Chronic diastolic (congestive) heart failure: Secondary | ICD-10-CM

## 2016-10-22 DIAGNOSIS — I10 Essential (primary) hypertension: Secondary | ICD-10-CM

## 2016-10-22 NOTE — Telephone Encounter (Signed)
[facility] pharmacy called, pt med is ready for pick up.

## 2016-10-22 NOTE — Patient Instructions (Addendum)
.Medication Instructions:  Your physician recommends that you continue on your current medications as directed. Please refer to the Current Medication list given to you today.   Labwork: TODAY:  BMET  Testing/Procedures: None ordered  Follow-Up: Your physician recommends that you schedule a follow-up appointment in: 2 MONTHS WITH DR. Radford Pax   Any Other Special Instructions Will Be Listed Below (If Applicable). DO NOT DRINK MORE THAN 68 OZ FLUID IN A DAY  Your physician recommends that you weigh, daily, at the same time every day, and in the same amount of clothing. If you notice a 3 lb weight gain in 24 hours, or more than 5 lbs in a week, call our office.    DASH Eating Plan DASH stands for "Dietary Approaches to Stop Hypertension." The DASH eating plan is a healthy eating plan that has been shown to reduce high blood pressure (hypertension). It may also reduce your risk for type 2 diabetes, heart disease, and stroke. The DASH eating plan may also help with weight loss. What are tips for following this plan? General guidelines  Avoid eating more than 2,000 mg (milligrams) of salt (sodium) a day. If you have hypertension, you may need to reduce your sodium intake to 1,500 mg a day.  Limit alcohol intake to no more than 1 drink a day for nonpregnant women and 2 drinks a day for men. One drink equals 12 oz of beer, 5 oz of wine, or 1 oz of hard liquor.  Work with your health care provider to maintain a healthy body weight or to lose weight. Ask what an ideal weight is for you.  Get at least 30 minutes of exercise that causes your heart to beat faster (aerobic exercise) most days of the week. Activities may include walking, swimming, or biking.  Work with your health care provider or diet and nutrition specialist (dietitian) to adjust your eating plan to your individual calorie needs. Reading food labels  Check food labels for the amount of sodium per serving. Choose foods with  less than 5 percent of the Daily Value of sodium. Generally, foods with less than 300 mg of sodium per serving fit into this eating plan.  To find whole grains, look for the word "whole" as the first word in the ingredient list. Shopping  Buy products labeled as "low-sodium" or "no salt added."  Buy fresh foods. Avoid canned foods and premade or frozen meals. Cooking  Avoid adding salt when cooking. Use salt-free seasonings or herbs instead of table salt or sea salt. Check with your health care provider or pharmacist before using salt substitutes.  Do not fry foods. Cook foods using healthy methods such as baking, boiling, grilling, and broiling instead.  Cook with heart-healthy oils, such as olive, canola, soybean, or sunflower oil. Meal planning   Eat a balanced diet that includes: ? 5 or more servings of fruits and vegetables each day. At each meal, try to fill half of your plate with fruits and vegetables. ? Up to 6-8 servings of whole grains each day. ? Less than 6 oz of lean meat, poultry, or fish each day. A 3-oz serving of meat is about the same size as a deck of cards. One egg equals 1 oz. ? 2 servings of low-fat dairy each day. ? A serving of nuts, seeds, or beans 5 times each week. ? Heart-healthy fats. Healthy fats called Omega-3 fatty acids are found in foods such as flaxseeds and coldwater fish, like sardines, salmon, and mackerel.  Limit how much you eat of the following: ? Canned or prepackaged foods. ? Food that is high in trans fat, such as fried foods. ? Food that is high in saturated fat, such as fatty meat. ? Sweets, desserts, sugary drinks, and other foods with added sugar. ? Full-fat dairy products.  Do not salt foods before eating.  Try to eat at least 2 vegetarian meals each week.  Eat more home-cooked food and less restaurant, buffet, and fast food.  When eating at a restaurant, ask that your food be prepared with less salt or no salt, if  possible. What foods are recommended? The items listed may not be a complete list. Talk with your dietitian about what dietary choices are best for you. Grains Whole-grain or whole-wheat bread. Whole-grain or whole-wheat pasta. Brown rice. [place]. Bulgur. Whole-grain and low-sodium cereals. Pita bread. Low-fat, low-sodium crackers. Whole-wheat flour tortillas. Vegetables Fresh or frozen vegetables (raw, steamed, roasted, or grilled). Low-sodium or reduced-sodium tomato and vegetable juice. Low-sodium or reduced-sodium tomato sauce and tomato paste. Low-sodium or reduced-sodium canned vegetables. Fruits All fresh, dried, or frozen fruit. Canned fruit in natural juice (without added sugar). Meat and other protein foods Skinless chicken or [place]. Ground chicken or [place]. Pork with fat trimmed off. Fish and seafood. Egg whites. Dried beans, peas, or lentils. Unsalted nuts, nut butters, and seeds. Unsalted canned beans. Lean cuts of beef with fat trimmed off. Low-sodium, lean deli meat. Dairy Low-fat (1%) or fat-free (skim) milk. Fat-free, low-fat, or reduced-fat cheeses. Nonfat, low-sodium ricotta or cottage cheese. Low-fat or nonfat yogurt. Low-fat, low-sodium cheese. Fats and oils Soft margarine without trans fats. Vegetable oil. Low-fat, reduced-fat, or light mayonnaise and salad dressings (reduced-sodium). Canola, safflower, olive, soybean, and sunflower oils. Avocado. Seasoning and other foods Herbs. Spices. Seasoning mixes without salt. Unsalted popcorn and pretzels. Fat-free sweets. What foods are not recommended? The items listed may not be a complete list. Talk with your dietitian about what dietary choices are best for you. Grains Baked goods made with fat, such as croissants, muffins, or some breads. Dry pasta or rice meal packs. Vegetables Creamed or fried vegetables. Vegetables in a cheese sauce. Regular canned vegetables (not low-sodium or reduced-sodium). Regular canned  tomato sauce and paste (not low-sodium or reduced-sodium). Regular tomato and vegetable juice (not low-sodium or reduced-sodium). Angie Fava. Olives. Fruits Canned fruit in a light or heavy syrup. Fried fruit. Fruit in cream or butter sauce. Meat and other protein foods Fatty cuts of meat. Ribs. Fried meat. Berniece Salines. Sausage. Bologna and other processed lunch meats. Salami. Fatback. Hotdogs. Bratwurst. Salted nuts and seeds. Canned beans with added salt. Canned or smoked fish. Whole eggs or egg yolks. Chicken or [place] with skin. Dairy Whole or 2% milk, cream, and half-and-half. Whole or full-fat cream cheese. Whole-fat or sweetened yogurt. Full-fat cheese. Nondairy creamers. Whipped toppings. Processed cheese and cheese spreads. Fats and oils Butter. Stick margarine. Lard. Shortening. Ghee. Bacon fat. Tropical oils, such as coconut, palm kernel, or palm oil. Seasoning and other foods Salted popcorn and pretzels. Onion salt, garlic salt, seasoned salt, table salt, and sea salt. Worcestershire sauce. Tartar sauce. Barbecue sauce. Teriyaki sauce. Soy sauce, including reduced-sodium. Steak sauce. Canned and packaged gravies. Fish sauce. Oyster sauce. Cocktail sauce. Horseradish that you find on the shelf. Ketchup. Mustard. Meat flavorings and tenderizers. Bouillon cubes. Hot sauce and Tabasco sauce. Premade or packaged marinades. Premade or packaged taco seasonings. Relishes. Regular salad dressings. Where to find more information:  National Heart, Lung, and Blood  Institute: https://wilson-eaton.com/  American Heart Association: www.heart.org Summary  The DASH eating plan is a healthy eating plan that has been shown to reduce high blood pressure (hypertension). It may also reduce your risk for type 2 diabetes, heart disease, and stroke.  With the DASH eating plan, you should limit salt (sodium) intake to 2,300 mg a day. If you have hypertension, you may need to reduce your sodium intake to 1,500 mg a day.  When  on the DASH eating plan, aim to eat more fresh fruits and vegetables, whole grains, lean proteins, low-fat dairy, and heart-healthy fats.  Work with your health care provider or diet and nutrition specialist (dietitian) to adjust your eating plan to your individual calorie needs. This information is not intended to replace advice given to you by your health care provider. Make sure you discuss any questions you have with your health care provider. Document Released: 04/02/2011 Document Revised: 04/06/2016 Document Reviewed: 04/06/2016 Elsevier Interactive Patient Education  2017 [employer].

## 2016-10-22 NOTE — Telephone Encounter (Signed)
diclofenac sodium (VOLTAREN) 1 % GEL, refill request @ [facility] in [place].

## 2016-10-23 LAB — BASIC METABOLIC PANEL
BUN / CREAT RATIO: 22 (ref 12–28)
BUN: 37 mg/dL — AB (ref 8–27)
CO2: 30 mmol/L — ABNORMAL HIGH (ref 20–29)
CREATININE: 1.7 mg/dL — AB (ref 0.57–1.00)
Calcium: 9.3 mg/dL (ref 8.7–10.3)
Chloride: 101 mmol/L (ref 96–106)
GFR calc Af Amer: 36 mL/min/{1.73_m2} — ABNORMAL LOW (ref >59)
GFR calc non Af Amer: 31 mL/min/{1.73_m2} — ABNORMAL LOW (ref >59)
GLUCOSE: 104 mg/dL — AB (ref 65–99)
Potassium: 4 mmol/L (ref 3.5–5.2)
SODIUM: 145 mmol/L — AB (ref 134–144)

## 2016-10-26 ENCOUNTER — Encounter: Payer: Self-pay | Admitting: Pharmacist

## 2016-10-26 NOTE — Progress Notes (Signed)
Kathleen [name] is a 66 y.o. female who was reviewed for monitoring of apixaban (Eliquis) therapy.    ASSESSMENT Indication(s): atrial fibrillation CHA2DS2VASC score 5, HAS-BLED 1 Duration: indefinite  Labs: Component Value Date/Time   AST 15 02/14/2016 1115   ALT 11 (L) 02/14/2016 1115   NA 145 (H) 10/22/2016 1224   K 4.0 10/22/2016 1224   CL 101 10/22/2016 1224   CO2 30 (H) 10/22/2016 1224   GLUCOSE 104 (H) 10/22/2016 1224   GLUCOSE 107 (H) 04/06/2016 1424   HGBA1C 8.2 10/20/2016 1107   HGBA1C 6.9 (H) 01/11/2015 1548   BUN 37 (H) 10/22/2016 1224   CREATININE 1.70 (H) 10/22/2016 1224   CREATININE 1.60 (H) 04/06/2016 1424   CALCIUM 9.3 10/22/2016 1224   GFRNONAA 31 (L) 10/22/2016 1224   GFRNONAA 65 06/07/2014 1027   WBC 9.4 04/06/2016 1424   HGB 12.7 04/06/2016 1424   HGB 10.2 (L) 01/22/2015 1217   HCT 40.4 04/06/2016 1424   HCT 34.1 01/22/2015 1217   PLT 182 04/06/2016 1424   PLT 165 01/22/2015 1217   apixaban (Eliquis) Dose: 5 mg BID  No concerns or recommendations at this time, pharmacy records indicate consistent refills  [place] Pharmacist  10/26/2016, 6:29 PM

## 2016-11-11 ENCOUNTER — Other Ambulatory Visit: Payer: Self-pay | Admitting: *Deleted

## 2016-11-11 NOTE — Patient Outreach (Signed)
[facility] Care Management  11/11/2016   Kathleen [name] 11/07/1950 [id number] RN Health Coach telephone call to patient.  Hipaa compliance verified. Per patient her fasting blood sugar was 105 today. Patient has been following up with her medical appointments. Patient has had a weight change of 284-261. Per patient she has stopped drinking sodas and only drink water.  Patient is eating more fresh fruit and vegetables especially tomatoes and cukes.  Patient has started excercsing by walking twice a day for  30 minutes .  Patient is getting up the treadmill.  Per patient her blood pressure is better. Today it is 143/60. Patient is taking medications as prescribed. Patient has agreed to further outreach calls.  Current Medications:  Current Outpatient Prescriptions  Medication Sig Dispense Refill  . ACCU-CHEK FASTCLIX LANCETS MISC TEST BLOOD SUGAR THREE TIMES DAILY 306 each 1  . amitriptyline (ELAVIL) 50 MG tablet Take 0.5 tablets (25 mg total) by mouth at bedtime. 30 tablet 2  . apixaban (ELIQUIS) 5 MG TABS tablet Take 1 tablet (5 mg total) by mouth 2 (two) times daily. 60 tablet 11  . BD INSULIN SYRINGE ULTRAFINE 31G X 15/64" 1 ML MISC USE TO INJECT INSULIN ONE TIME A DAY 100 each 5  . Blood Glucose Monitoring Suppl (ACCU-CHEK NANO SMARTVIEW) W/DEVICE KIT Check blood sugar 3 times daily 1 kit 0  . bumetanide (BUMEX) 1 MG tablet Take 2 mg (two tablets) in the AM. Take 1 mg (1 tablet) at night. 270 tablet 3  . diclofenac sodium (VOLTAREN) 1 % GEL Apply 4 g topically 4 (four) times daily. 100 g 2  . gabapentin (NEURONTIN) 300 MG capsule TAKE 1 CAPSULE in the morning and afternoon and 2 at night 360 capsule 1  . glucose blood (ACCU-CHEK SMARTVIEW) test strip TEST BLOOD SUGAR THREE TIMES DAILY ICD-10 E11.9 100 each 11  . insulin detemir (LEVEMIR) 100 UNIT/ML injection Inject 0.82 mLs (82 Units total) into the skin at bedtime. 30 mL 5  . Insulin Pen Needle (PEN NEEDLES) 32G X 5 MM  MISC 0.6 mg by Does not apply route daily. Use 1 time daily to inject victoza. diag code E11.9 insulin dependent 90 each 6  . liraglutide (VICTOZA) 18 MG/3ML SOPN Inject 0.2 mLs (1.2 mg total) into the skin daily. 3 mL 3  . metFORMIN (GLUCOPHAGE) 1000 MG tablet TAKE 1 TABLET (1,000 MG TOTAL) BY MOUTH 2 (TWO) TIMES DAILY WITH A MEAL. 180 tablet 3  . Multiple Vitamins-Minerals (HEALTHY EYES PO) Take by mouth daily. Reported on 05/17/2015    . NON FORMULARY Place 2 L into the nose daily. Wears with exertion and qhs    . omeprazole (PRILOSEC) 20 MG capsule TAKE 1 CAPSULE (20 MG TOTAL) BY MOUTH DAILY. 90 capsule 1   No current facility-administered medications for this visit.     Functional Status:  In your present state of health, do you have any difficulty performing the following activities: 11/11/2016 10/20/2016  Hearing? N N  Vision? N N  Difficulty concentrating or making decisions? N N  Walking or climbing stairs? Y Y  Dressing or bathing? N N  Doing errands, shopping? N N  Preparing Food and eating ? N -  Using the Toilet? N -  In the past six months, have you accidently leaked urine? N -  Do you have problems with loss of bowel control? N -  Managing your Medications? N -  Managing your Finances? N -  Housekeeping or managing  your Housekeeping? N -  Some recent data might be hidden    Fall/Depression Screening: Fall Risk  11/11/2016 10/20/2016 10/12/2016  Falls in the past year? No No No  Number falls in past yr: - - -  Injury with Fall? - - -  Risk Factor Category  - - -  Risk for fall due to : Impaired balance/gait - Impaired balance/gait;Impaired mobility  Risk for fall due to (comments): - - -  Follow up - - Falls evaluation completed;Education provided;Falls prevention discussed   PHQ 2/9 Scores 11/11/2016 10/20/2016 10/12/2016 09/14/2016 08/14/2016 07/15/2016 07/08/2016  PHQ - 2 Score 0 0 0 0 0 0 0   THN CM Care Plan Problem One     Most Recent Value  Care Plan Problem One   Knowledge deficit in self management of diabetes  Role Documenting the Problem One  [place] for Problem One  Active  THN Long Term Goal   un met patient A!C decreased by.1  THN Long Term Goal Start Date  11/11/16  Interventions for Problem One [place] discussed with patient what the  A1C is.  RN discussed how A1C is affected by the blood sugars.  RN sent EMMI information on  Why get A1C checked. RN sent educational material on Know your goal numbers. RN sent educational material on How the A1C helps.RN discussed possible reasons why patient A1C went up. RN Health Coach reiterate goals with each follow up   [facility] CM Short Term Goal #1   patient will be able to verbalize obtaining appropiate fitting compression hose within the next 30 days  THN CM Short Term Goal #1 Start Date  11/11/16  Interventions for Short Term Goal #1  RN discussed with patient about getting the correct size compression hose. RN sent patient educational material on compression hose, How to order correct size and how to put them on. RN wil follow up with next discussion to see if patient obtained.. RN sent patient a brochure on places to get hose and meaure properly  [facility] CM Short Term Goal #3  patient will be looking at returning to exercise program within the next 30 days  THN CM Short Term Goal #3 Met Date  11/11/16  [facility] CM Short Term Goal #4  Patient will report her blood sugars are under 200 within the next 30 days  THN CM Short Term Goal #4 Met Date  11/11/16  Interventions for Short Term Goal #4  Patient will choose a healthier snack at [facility] to help her blood sugar decrease in am. Patient understand she can do exercise at hs to help blood sugars. Patient understands she can drink water to keep hydrated.      Assessment:   A1C is 8.2 from 8.3 Weight is decreasing from 284-261 pounds Patient is walking 30 minutes twice a day Patient is trying to eat low sodium, low sugar and eliminate  sodas from diet  Plan:  RN discussed eating healthy foods and patient diet RN discussed A1C future plan for outcomes RN discussed exercising and developing a plan that is good for her and sticking to it RN discussed medication adherence RN will follow up within the month of August with discussion and teach back.  [facility] Management [phone]

## 2016-11-30 ENCOUNTER — Ambulatory Visit
Admission: RE | Admit: 2016-11-30 | Discharge: 2016-11-30 | Disposition: A | Discharge status: Home / Self Care | Payer: Medicaid Other | Source: Ambulatory Visit | Attending: Internal Medicine | Admitting: Internal Medicine

## 2016-11-30 ENCOUNTER — Other Ambulatory Visit: Payer: Self-pay | Admitting: Internal Medicine

## 2016-11-30 ENCOUNTER — Ambulatory Visit
Admission: RE | Admit: 2016-11-30 | Discharge: 2016-11-30 | Disposition: A | Discharge status: Home / Self Care | Payer: Medicare HMO | Source: Ambulatory Visit | Attending: Internal Medicine | Admitting: Internal Medicine

## 2016-11-30 DIAGNOSIS — R928 Other abnormal and inconclusive findings on diagnostic imaging of breast: Secondary | ICD-10-CM

## 2016-11-30 DIAGNOSIS — N6489 Other specified disorders of breast: Secondary | ICD-10-CM | POA: Diagnosis not present

## 2016-12-02 ENCOUNTER — Encounter (INDEPENDENT_AMBULATORY_CARE_PROVIDER_SITE_OTHER): Payer: Medicare HMO | Admitting: Ophthalmology

## 2016-12-02 DIAGNOSIS — H33302 Unspecified retinal break, left eye: Secondary | ICD-10-CM

## 2016-12-02 DIAGNOSIS — H43813 Vitreous degeneration, bilateral: Secondary | ICD-10-CM | POA: Diagnosis not present

## 2016-12-02 DIAGNOSIS — E11311 Type 2 diabetes mellitus with unspecified diabetic retinopathy with macular edema: Secondary | ICD-10-CM | POA: Diagnosis not present

## 2016-12-02 DIAGNOSIS — I10 Essential (primary) hypertension: Secondary | ICD-10-CM | POA: Diagnosis not present

## 2016-12-02 DIAGNOSIS — H35033 Hypertensive retinopathy, bilateral: Secondary | ICD-10-CM | POA: Diagnosis not present

## 2016-12-02 DIAGNOSIS — E113313 Type 2 diabetes mellitus with moderate nonproliferative diabetic retinopathy with macular edema, bilateral: Secondary | ICD-10-CM

## 2016-12-08 ENCOUNTER — Encounter: Payer: Self-pay | Admitting: Podiatry

## 2016-12-08 ENCOUNTER — Ambulatory Visit (INDEPENDENT_AMBULATORY_CARE_PROVIDER_SITE_OTHER): Payer: Medicare HMO | Admitting: Podiatry

## 2016-12-08 DIAGNOSIS — M79676 Pain in unspecified toe(s): Secondary | ICD-10-CM

## 2016-12-08 DIAGNOSIS — B351 Tinea unguium: Secondary | ICD-10-CM | POA: Diagnosis not present

## 2016-12-08 DIAGNOSIS — E1142 Type 2 diabetes mellitus with diabetic polyneuropathy: Secondary | ICD-10-CM | POA: Diagnosis not present

## 2016-12-08 NOTE — Progress Notes (Signed)
Patient ID: Kathleen [name], female   DOB: 1951/01/05, 66 y.o.   MRN: [id number]    Subjective: This patient presents again for schedule visit complaining of painful toenails and walking wearing shoes and is requesting nail debridement. Patient lives in assisted living facility  Objective: Orientated 3 Bilateral peripheral pitting edema DP PT pulses 1/4 bilaterally Capillary reflex immediate bilaterally Sensation to 10 g monofilament wire intact 2/5 right 1/5 left Vibratory sensation nonreactive bilaterally Ankle reflex equal and reactive bilaterally Scaling skin bilaterally Varus rotated fourth and fifth toes bilaterally No Open skin lesions bilaterally Atrophic skin with absent hair growth bilaterally The toenails are hypertrophic, elongated, incurvated, discolored and tender direct palpation 6-10 Manual motor testing dorsi flexion, plantar flexion, inversion, eversion 5/5 bilaterally  Assessment: Diabeticwith peripheral neuropathy Decrease pedal pulses suggestive you have possible peripheral arterial disease without any open wounds Symptomatic onychomycoses 6-10  Plan: Debrided toenails 10 mechanically and electrically without any bleeding  Reappoint 3 months

## 2016-12-08 NOTE — Patient Instructions (Signed)

## 2016-12-09 ENCOUNTER — Encounter: Payer: Self-pay | Admitting: *Deleted

## 2016-12-09 ENCOUNTER — Other Ambulatory Visit: Payer: Self-pay | Admitting: *Deleted

## 2016-12-09 NOTE — Patient Outreach (Addendum)
[place]) Care Management  12/09/2016   Kathleen [name] [birth year] [id number]  RN Health Coach telephone call to patient.  Hipaa compliance verified. Per patient she is doing much better. Patient fasting blood sugar is 114. Patient stated it is now staying around that. Patient last A1C was 8.2. Dr wants to get her A1C to 6.. Patient is eating much better. Per patient she has received 10 meals from  [facility] a month for a total of 60.  Patient stated they are very healthy and taste good. Per patient she is eating lots of baked fished and vegetables such as broccoli  and carrots. Patient has stopped drinking sodas and drink water and crystal light tea. She has not had any falls since last outreach. She does use a cane for stability. Patient has been going for follow up eye exams. The left eye is blurred with 20/40 visiion . The right eye has 20/20 vision. Per patient her blood pressure is good. Patient had exam with podiatrist 12/08/2016 to get her toenails cut. Patient has met most of her goals except her long term goal. Patient has agreed to follow up outreach calls.   Current Medications:  Current Outpatient Prescriptions  Medication Sig Dispense Refill  . ACCU-CHEK FASTCLIX LANCETS MISC TEST BLOOD SUGAR THREE TIMES DAILY 306 each 1  . amitriptyline (ELAVIL) 50 MG tablet Take 0.5 tablets (25 mg total) by mouth at bedtime. 30 tablet 2  . apixaban (ELIQUIS) 5 MG TABS tablet Take 1 tablet (5 mg total) by mouth 2 (two) times daily. 60 tablet 11  . BD INSULIN SYRINGE ULTRAFINE 31G X 15/64" 1 ML MISC USE TO INJECT INSULIN ONE TIME A DAY 100 each 5  . Blood Glucose Monitoring Suppl (ACCU-CHEK NANO SMARTVIEW) W/DEVICE KIT Check blood sugar 3 times daily 1 kit 0  . bumetanide (BUMEX) 1 MG tablet Take 2 mg (two tablets) in the AM. Take 1 mg (1 tablet) at night. 270 tablet 3  . diclofenac sodium (VOLTAREN) 1 % GEL Apply 4 g topically 4 (four) times daily. 100 g 2  . gabapentin (NEURONTIN)  300 MG capsule TAKE 1 CAPSULE in the morning and afternoon and 2 at night 360 capsule 1  . glucose blood (ACCU-CHEK SMARTVIEW) test strip TEST BLOOD SUGAR THREE TIMES DAILY ICD-10 E11.9 100 each 11  . insulin detemir (LEVEMIR) 100 UNIT/ML injection Inject 0.82 mLs (82 Units total) into the skin at bedtime. 30 mL 5  . Insulin Pen Needle (PEN NEEDLES) 32G X 5 MM MISC 0.6 mg by Does not apply route daily. Use 1 time daily to inject victoza. diag code E11.9 insulin dependent 90 each 6  . liraglutide (VICTOZA) 18 MG/3ML SOPN Inject 0.2 mLs (1.2 mg total) into the skin daily. 3 mL 3  . metFORMIN (GLUCOPHAGE) 1000 MG tablet TAKE 1 TABLET (1,000 MG TOTAL) BY MOUTH 2 (TWO) TIMES DAILY WITH A MEAL. 180 tablet 3  . Multiple Vitamins-Minerals (HEALTHY EYES PO) Take by mouth daily. Reported on 05/17/2015    . NON FORMULARY Place 2 L into the nose daily. Wears with exertion and qhs    . omeprazole (PRILOSEC) 20 MG capsule TAKE 1 CAPSULE (20 MG TOTAL) BY MOUTH DAILY. 90 capsule 1   No current facility-administered medications for this visit.     Functional Status:  In your present state of health, do you have any difficulty performing the following activities: 12/09/2016 11/11/2016  Hearing? N N  Vision? (No Data) N  Comment lt  eye slight blurred/ vision 20/40 -  Difficulty concentrating or making decisions? N N  Walking or climbing stairs? Y Y  Dressing or bathing? N N  Doing errands, shopping? N N  Preparing Food and eating ? N N  Using the Toilet? N N  In the past six months, have you accidently leaked urine? N N  Do you have problems with loss of bowel control? N N  Managing your Medications? N N  Managing your Finances? N N  Housekeeping or managing your Housekeeping? N N  Some recent data might be hidden    Fall/Depression Screening: Fall Risk  12/09/2016 11/11/2016 10/20/2016  Falls in the past year? No No No  Number falls in past yr: - - -  Comment - - -  Injury with Fall? - - -  Comment - -  -  Risk Factor Category  - - -  Risk for fall due to : Impaired mobility;Impaired balance/gait;History of fall(s) Impaired balance/gait -  Risk for fall due to: Comment - - -  Follow up Falls evaluation completed;Education provided;Falls prevention discussed - -   PHQ 2/9 Scores 12/09/2016 11/11/2016 10/20/2016 10/12/2016 09/14/2016 08/14/2016 07/15/2016  PHQ - 2 Score 0 0 0 0 0 0 0   THN CM Care Plan Problem One     Most Recent Value  Care Plan Problem One  Knowledge deficit in self management of diabetes  Role Documenting the Problem One  [place] for Problem One  Active  THN Long Term Goal   Patient A1C will decrease within the next 90 days  THN Long Term Goal Start Date  12/09/16  Interventions for Problem One [place] discussed with patient what the  A1C is.  RN discussed how A1C is affected by the blood sugars.  RN sent EMMI information on  Why get A1C checked. RN sent educational material on Know your goal numbers. RN sent educational material on How the A1C helps.RN discussed possible reasons why patient A1C went up. RN Health Coach reiterate goals with each follow up   [facility] CM Short Term Goal #2   Patient will be able to verbalize an action plan for high and low blood sugar within 30 days  THN CM Short Term Goal #2 Start Date  12/09/16  Interventions for Short Term Goal #2  RN reviewed patient action plan  and discussed additional action that patient should be takening. RN talked about additional fluids and food patient could take on her sick days also. RN sent EMMI educational material on self care.  RN  follows up with teach back  THN CM Short Term Goal #3  patient will be looking at returning to exercise program within the next 30 days  THN CM Short Term Goal #3 Start Date  10/12/16  Interventions for Short Tern Goal #3  RN discussed walking vs machines till back is better. RN discussed keeping hydrated and perventing heat exhaustion while wal  THN CM  Short Term Goal #4  Patient will report her blood sugars are under 200 within the next 30 days      Assessment:  Patient fasting blood sugar is 114 Patient A1C is 8.2 Patient will continue to  benefit from [place] telephonic outreach for education and support for diabetes self management.  Plan:  RN discussed the importance of exercising RN discussed eating healthy RN discussed goals of A1C RN sent calendar book for documentation RN will follow  up within the month of September   [place] Management [phone]

## 2016-12-14 ENCOUNTER — Other Ambulatory Visit: Payer: Self-pay | Admitting: Internal Medicine

## 2016-12-14 DIAGNOSIS — G629 Polyneuropathy, unspecified: Secondary | ICD-10-CM

## 2016-12-22 ENCOUNTER — Ambulatory Visit (INDEPENDENT_AMBULATORY_CARE_PROVIDER_SITE_OTHER): Payer: Medicare HMO | Admitting: Physician Assistant

## 2016-12-22 ENCOUNTER — Encounter: Payer: Self-pay | Admitting: Physician Assistant

## 2016-12-22 VITALS — BP 108/60 | HR 65 | Ht 60.0 in | Wt 263.0 lb

## 2016-12-22 DIAGNOSIS — N183 Chronic kidney disease, stage 3 unspecified: Secondary | ICD-10-CM

## 2016-12-22 DIAGNOSIS — I4892 Unspecified atrial flutter: Secondary | ICD-10-CM | POA: Diagnosis not present

## 2016-12-22 DIAGNOSIS — Z6841 Body Mass Index (BMI) 40.0 and over, adult: Secondary | ICD-10-CM

## 2016-12-22 DIAGNOSIS — I5032 Chronic diastolic (congestive) heart failure: Secondary | ICD-10-CM

## 2016-12-22 DIAGNOSIS — I10 Essential (primary) hypertension: Secondary | ICD-10-CM

## 2016-12-22 NOTE — Patient Instructions (Addendum)
Medication Instructions:  Your physician recommends that you continue on your current medications as directed. Please refer to the Current Medication list given to you today.   Labwork: Your physician recommends that you have a BMET.   Testing/Procedures: None  Follow-Up: Your physician recommends that you keep the  scheduled follow-up appointment with Dr.Turner.   Any Other Special Instructions Will Be Listed Below (If Applicable).     If you need a refill on your cardiac medications before your next appointment, please call your pharmacy.

## 2016-12-22 NOTE — Progress Notes (Signed)
Cardiology Office Note    Date:  12/22/2016   ID:  Kathleen [name], DOB Jan 21, 1951, MRN [id number]  PCP:  Aldine Contes, MD  Cardiologist:   Chief Complaint  Patient presents with  . Follow-up    History of Present Illness:  Kathleen [name] is a 66 y.o. female with a hx of Hypertension, morbid obesity, type 2 diabetes, IBS, dyslipidemia, obesity hypoventilation syndrome(on home O2 with no OSA by PSG but showed nocturnal hypoxemia), CK D stage III and chronic diastolic CHF.She also has permanent atrial flutter CHADSVASC=3 on Eliquis 5 mg BID.  Patient saw Dr. Radford Pax 09/2016 with fluid overload and BNP of 1452, creatinine 1.64. Bumex was increased to 2 mg twice a day for 2 days then 2 mg in the morning on 1 in the afternoon. She was reevaluated by Daune Perch NP 2 weeks later and was doing better. Creatinine was 1.7.  Patient comes in today for follow-up. She has lost 19 pounds. She stopped drinking sodas completely. Her breathing is better and her chronic edema is down. She gets a little dizzy and off balance when she first gets up but no presyncope..   Past Medical History:  Diagnosis Date  . Atrial flutter ([place]) 04/06/2016  . Bradycardia 04/06/2016   type 1 second degree AV block  . Chronic diastolic (congestive) heart failure ([place]) 01/10/2015  . Degenerative joint disease of hand   . Diabetes mellitus   . Dyslipidemia   . Fecal occult blood test positive   . GERD (gastroesophageal reflux disease)   . Headache   . Hypertension   . Inadequate material resources   . Irritable bowel syndrome   . Obesity   . Post-menopausal bleeding   . Postmenopausal   . Shortness of breath dyspnea    multifactorial from obesity, deconditioning, obesity hypoventilation syndrome    Past Surgical History:  Procedure Laterality Date  . CHOLECYSTECTOMY    . EYE SURGERY     left lens implant s/p cataracts  . OTHER SURGICAL HISTORY     right shoulder tendon repair 05/2011    Current  Medications: Current Meds  Medication Sig  . ACCU-CHEK FASTCLIX LANCETS MISC TEST BLOOD SUGAR THREE TIMES DAILY  . amitriptyline (ELAVIL) 50 MG tablet TAKE 0.5 TABLETS (25 MG TOTAL) BY MOUTH AT BEDTIME.  Marland Kitchen apixaban (ELIQUIS) 5 MG TABS tablet Take 1 tablet (5 mg total) by mouth 2 (two) times daily.  . BD INSULIN SYRINGE ULTRAFINE 31G X 15/64" 1 ML MISC USE TO INJECT INSULIN ONE TIME A DAY  . Blood Glucose Monitoring Suppl (ACCU-CHEK NANO SMARTVIEW) W/DEVICE KIT Check blood sugar 3 times daily  . bumetanide (BUMEX) 1 MG tablet Take 2 mg (two tablets) in the AM. Take 1 mg (1 tablet) at night.  . diclofenac sodium (VOLTAREN) 1 % GEL Apply 4 g topically 4 (four) times daily.  Marland Kitchen gabapentin (NEURONTIN) 300 MG capsule Take 300-600 mg by mouth 2 (two) times daily. 376m by mouth in the morning and 6063mby mouth at night  . glucose blood (ACCU-CHEK SMARTVIEW) test strip TEST BLOOD SUGAR THREE TIMES DAILY ICD-10 E11.9  . insulin detemir (LEVEMIR) 100 UNIT/ML injection Inject 0.82 mLs (82 Units total) into the skin at bedtime.  . Insulin Pen Needle (PEN NEEDLES) 32G X 5 MM MISC 0.6 mg by Does not apply route daily. Use 1 time daily to inject victoza. diag code E11.9 insulin dependent  . liraglutide (VICTOZA) 18 MG/3ML SOPN Inject 0.2 mLs (1.2 mg total)  into the skin daily.  . metFORMIN (GLUCOPHAGE) 1000 MG tablet TAKE 1 TABLET (1,000 MG TOTAL) BY MOUTH 2 (TWO) TIMES DAILY WITH A MEAL.  . Multiple Vitamins-Minerals (HEALTHY EYES PO) Take 1 tablet by mouth daily. Reported on 05/17/2015  . NON FORMULARY Place 2 L into the nose daily. Wears with exertion and qhs  . omeprazole (PRILOSEC) 20 MG capsule TAKE 1 CAPSULE (20 MG TOTAL) BY MOUTH DAILY.     Allergies:   Doxycycline   Social History   Social History  . Marital status: Divorced    Spouse name: N/A  . Number of children: 2  . Years of education: N/A   Occupational History  . unemployed Unemployed   Social History Main Topics  . Smoking  status: Never Smoker  . Smokeless tobacco: Never Used  . Alcohol use No  . Drug use: No  . Sexual activity: Not Currently   Other Topics Concern  . Not on file   Social History Narrative   Single.   Divorced x2.   Has 2 children, by two different fathers.   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 May apply for disability/SSI given her pain in her hands which has made interviewing for jobs difficult.   Never Smoked   Alcohol use-no   Drug use-no   Regular exercise-yes      Financial assistance approved for 100% discount at [facility] and has [facility] card per [employer]   02/18/2010              Family History:  The patient's family history includes Diabetes in her mother and sister; Obesity in her mother; Stroke in her father.   ROS:   Please see the history of present illness.    Review of Systems  Constitution: Negative.  HENT: Negative.   Eyes: Positive for visual disturbance.  Cardiovascular: Positive for irregular heartbeat and leg swelling.  Respiratory: Negative.   Hematologic/Lymphatic: Bruises/bleeds easily.  Musculoskeletal: Positive for joint swelling. Negative for joint pain.  Gastrointestinal: Negative.   Genitourinary: Negative.   Neurological: Positive for headaches and loss of balance.   All other systems reviewed and are negative.   PHYSICAL EXAM:   VS:  BP 108/60   Pulse 65   Ht 5' (1.524 m)   Wt 263 lb (119.3 kg)   BMI 51.36 kg/m   Physical Exam  GEN: Obese, in no acute distress  Neck: no JVD, carotid bruits, or masses Cardiac:RRR; no murmurs, rubs, or gallops  Respiratory:  clear to auscultation bilaterally, normal work of breathing GI: soft, nontender, nondistended, + BS Ext: Chronic +2 edema up to her knees Neuro:  Alert and Oriented x 3 Psych: euthymic mood, full affect  Wt Readings from Last 3 Encounters:  12/22/16 263 lb (119.3 kg)  10/22/16 279 lb 1.9 oz (126.6  kg)  10/20/16 282 lb 3.2 oz (128 kg)      Studies/Labs Reviewed:   EKG:  EKG is  ordered today.  The ekg ordered today demonstrates Looks like a junctional rhythm  Recent Labs: 02/14/2016: ALT 11 04/06/2016: Hemoglobin 12.7; Platelets 182 10/14/2016: NT-Pro BNP 1,452 10/22/2016: BUN 37; Creatinine, Ser 1.70; Potassium 4.0; Sodium 145   Lipid Panel    Component Value Date/Time   CHOL 128 06/30/2016 1154   TRIG 77 06/30/2016 1154   HDL 42 06/30/2016 1154   CHOLHDL 3.0 06/30/2016 1154   CHOLHDL 3.5 06/07/2014 1027  VLDL 16 06/07/2014 1027   LDLCALC 71 06/30/2016 1154    Additional studies/ records that were reviewed today include:   2-D echo 1/2/18Study Conclusions   - Left ventricle: The cavity size was normal. Systolic function was   normal. The estimated ejection fraction was in the range of 55%   to 60%. Wall motion was normal; there were no regional wall   motion abnormalities. - Aortic valve: There was mild regurgitation directed centrally in   the LVOT. - Left atrium: The atrium was mildly dilated. - Right atrium: The atrium was mildly dilated.      ASSESSMENT:    1. Chronic diastolic congestive heart failure ([place])   2. Atrial flutter, unspecified type ([place])   3. Essential hypertension   4. CKD (chronic kidney disease) stage 3, GFR 30-59 ml/min   5. Class 3 severe obesity due to excess calories with serious comorbidity and body mass index (BMI) of 50.0 to 59.9 in adult [facility])      PLAN:  In order of problems listed above: Chronic diastolic CHF well compensated. We'll check renal function today. She's had 19 pound weight loss. May need to adjust diuretics down. Still has chronic lower extremity edema but improved.  Atrial flutter chronic on Eliquis 5 mg BID not on any rate lowering medications and seems to be in a junctional rhythm today. check renal function on Eliquis.  Essential hypertension blood pressure on the low side  CK D stage III recheck renal  function today  Obesity patient has given up sodas and is really watching her diet. She's lost 19 pounds since June.   Medication Adjustments/Labs and Tests Ordered: Current medicines are reviewed at length with the patient today.  Concerns regarding medicines are outlined above.  Medication changes, Labs and Tests ordered today are listed in the Patient Instructions below. Patient Instructions  Medication Instructions:  Your physician recommends that you continue on your current medications as directed. Please refer to the Current Medication list given to you today.   Labwork: Your physician recommends that you have a BMET.   Testing/Procedures: None  Follow-Up: Your physician recommends that you keep the  scheduled follow-up appointment with Dr.Turner.   Any Other Special Instructions Will Be Listed Below (If Applicable).     If you need a refill on your cardiac medications before your next appointment, please call your pharmacy.      Sumner Boast, PA-C  12/22/2016 12:55 PM    [facility] [address], [place], [place]  [address] Phone: [phone]; Fax: [phone]

## 2016-12-23 LAB — BASIC METABOLIC PANEL
BUN/Creatinine Ratio: 20 (ref 12–28)
BUN: 32 mg/dL — AB (ref 8–27)
CALCIUM: 9 mg/dL (ref 8.7–10.3)
CO2: 27 mmol/L (ref 20–29)
CREATININE: 1.63 mg/dL — AB (ref 0.57–1.00)
Chloride: 99 mmol/L (ref 96–106)
GFR calc Af Amer: 38 mL/min/{1.73_m2} — ABNORMAL LOW (ref >59)
GFR calc non Af Amer: 33 mL/min/{1.73_m2} — ABNORMAL LOW (ref >59)
GLUCOSE: 71 mg/dL (ref 65–99)
Potassium: 3.9 mmol/L (ref 3.5–5.2)
SODIUM: 143 mmol/L (ref 134–144)

## 2017-01-06 ENCOUNTER — Other Ambulatory Visit: Payer: Self-pay | Admitting: *Deleted

## 2017-01-06 NOTE — Patient Outreach (Signed)
[place] [facility]) Care Management  01/06/2017  Kathleen [name] [birth year] [id number]   RN Health Coach attempted follow up outreach call to patient.  Patient was unavailable. HIPPA compliance voicemail message left with return callback number.  Plan: RN will call patient again within 14 days.  [place] Care Management [phone]

## 2017-01-12 ENCOUNTER — Encounter: Payer: Self-pay | Admitting: Internal Medicine

## 2017-01-12 ENCOUNTER — Ambulatory Visit (INDEPENDENT_AMBULATORY_CARE_PROVIDER_SITE_OTHER): Payer: Medicare HMO | Admitting: Internal Medicine

## 2017-01-12 VITALS — BP 135/49 | HR 69 | Temp 98.0°F | Ht 60.0 in | Wt 263.5 lb

## 2017-01-12 DIAGNOSIS — Z6841 Body Mass Index (BMI) 40.0 and over, adult: Secondary | ICD-10-CM | POA: Diagnosis not present

## 2017-01-12 DIAGNOSIS — Z23 Encounter for immunization: Secondary | ICD-10-CM

## 2017-01-12 DIAGNOSIS — I110 Hypertensive heart disease with heart failure: Secondary | ICD-10-CM | POA: Diagnosis not present

## 2017-01-12 DIAGNOSIS — E1169 Type 2 diabetes mellitus with other specified complication: Secondary | ICD-10-CM

## 2017-01-12 DIAGNOSIS — Z0000 Encounter for general adult medical examination without abnormal findings: Secondary | ICD-10-CM | POA: Diagnosis not present

## 2017-01-12 DIAGNOSIS — I4892 Unspecified atrial flutter: Secondary | ICD-10-CM

## 2017-01-12 DIAGNOSIS — G6289 Other specified polyneuropathies: Secondary | ICD-10-CM

## 2017-01-12 DIAGNOSIS — I5032 Chronic diastolic (congestive) heart failure: Secondary | ICD-10-CM | POA: Diagnosis not present

## 2017-01-12 DIAGNOSIS — Z794 Long term (current) use of insulin: Secondary | ICD-10-CM | POA: Diagnosis not present

## 2017-01-12 DIAGNOSIS — I10 Essential (primary) hypertension: Secondary | ICD-10-CM

## 2017-01-12 LAB — POCT GLYCOSYLATED HEMOGLOBIN (HGB A1C): HEMOGLOBIN A1C: 6.5

## 2017-01-12 LAB — GLUCOSE, CAPILLARY: GLUCOSE-CAPILLARY: 78 mg/dL (ref 65–99)

## 2017-01-12 MED ORDER — LIRAGLUTIDE 18 MG/3ML ~~LOC~~ SOPN: 1.2000 mg | PEN_INJECTOR | Freq: Every day | SUBCUTANEOUS | 3 refills | Status: DC

## 2017-01-12 MED ORDER — INSULIN DETEMIR 100 UNIT/ML ~~LOC~~ SOLN: 78.0000 [IU] | Freq: Every day | SUBCUTANEOUS | 5 refills | Status: DC

## 2017-01-12 NOTE — Patient Instructions (Signed)
-   It was a pleasure seeing you today - You are doing a great job with the weight loss! Keep up the great work! - Please take only 78 units of the levemir at night - Your BP is doing well. Keep up the great work! - Please follow up with me in 3 months

## 2017-01-13 NOTE — Assessment & Plan Note (Signed)
Flu shot given today

## 2017-01-13 NOTE — Assessment & Plan Note (Addendum)
Lab Results  Component Value Date   HGBA1C 6.5 01/12/2017   HGBA1C 8.2 10/20/2016   HGBA1C 8.3 06/30/2016     Assessment: Diabetes control:  well-controlled Progress toward A1C goal:   at goal Comments: Patient is compliant with her Levemir 82 units as well as her Victoza 1.2 mg daily and metformin 1000 mg twice daily . She is noted to have blood sugars running in the 60s to 100s on her meter.  Plan: Medications:  Will decrease Levemir to 78 units Home glucose monitoring: Frequency:   Timing:   Instruction/counseling given: reminded to bring blood glucose meter & log to each visit and discussed diet Educational resources provided: brochure (denies need ) Self management tools provided:   Other plans: Patient to follow-up with bariatric surgery for possible gastric bypass

## 2017-01-13 NOTE — Assessment & Plan Note (Signed)
-   Patient states her symptoms are much better since her blood sugars have been under control - Will continue with gabapentin and elavil

## 2017-01-13 NOTE — Assessment & Plan Note (Signed)
BP Readings from Last 3 Encounters:  01/12/17 (!) 135/49  12/22/16 108/60  10/22/16 120/60    Lab Results  Component Value Date   NA 143 12/22/2016   K 3.9 12/22/2016   CREATININE 1.63 (H) 12/22/2016    Assessment: Blood pressure control:  well controlled Progress toward BP goal:   at goal Comments: compliant with bumex  Plan: Medications:  continue current medications Educational resources provided: brochure (denies need) Self management tools provided:   Other plans:

## 2017-01-13 NOTE — Assessment & Plan Note (Signed)
-   Patient is following a diet and exercise over the last 2-3 months and has lost approximately 20 pounds - She is interested in possibly pursuing bariatric surgery and has been to a couple of classes - She would like referral to bariatric surgery (Dr. Volanda Napoleon at [place]) - Referral provided for patient today - Patient was congratulated on her weight loss was encouraged to continue with her diet and exercise

## 2017-01-13 NOTE — Assessment & Plan Note (Signed)
-   Patient denies any palpitations or lightheadedness or diaphoresis - Her heart rate is controlled without the aid of rate controlling medication - We will continue with apixiban for now

## 2017-01-13 NOTE — Assessment & Plan Note (Signed)
-   Patient follows with cardiology for this - She states her edema has improved considerably - She is still noted to have trace b/l LE edema on exam - We will continue with bumex for now at current dose

## 2017-01-13 NOTE — Progress Notes (Signed)
   Subjective:    Patient ID: Kathleen [name], female    DOB: December 21, 1950, 66 y.o.   MRN: [id number]  HPI   I have seen and examined this patient. Patient is here for routine follow-up of her diabetes and hypertension.  Patient denies any new complaints at this time. Patient states she has been following her diet at home and exercising as well and has lost approximately 20 pounds. She has been compliant with her medications and feels well at this time.   Review of Systems  Constitutional: Negative.   HENT: Negative.   Respiratory: Negative.   Cardiovascular: Negative.   Gastrointestinal: Negative.   Genitourinary: Negative.   Musculoskeletal: Negative.   Neurological: Negative.   Psychiatric/Behavioral: Negative.        Objective:   Physical Exam  Constitutional: She is oriented to person, place, and time. She appears well-developed and well-nourished.  HENT:  Head: Normocephalic and atraumatic.  Mouth/Throat: No oropharyngeal exudate.  Neck: Neck supple.  Cardiovascular: Normal rate, regular rhythm and normal heart sounds.   Pulmonary/Chest: Effort normal and breath sounds normal. No respiratory distress. She has no wheezes.  Abdominal: Soft. Bowel sounds are normal. She exhibits no distension. There is no tenderness.  Musculoskeletal: Normal range of motion. She exhibits edema.  Trace bilateral lower extremity edema noted  Lymphadenopathy:    She has no cervical adenopathy.  Neurological: She is alert and oriented to person, place, and time.  Skin: Skin is warm. No rash noted. No erythema.  Psychiatric: She has a normal mood and affect. Her behavior is normal.          Assessment & Plan:  Please see problem based charting for assessment and plan:

## 2017-01-14 ENCOUNTER — Encounter (INDEPENDENT_AMBULATORY_CARE_PROVIDER_SITE_OTHER): Payer: Medicare HMO | Admitting: Ophthalmology

## 2017-01-14 DIAGNOSIS — H43813 Vitreous degeneration, bilateral: Secondary | ICD-10-CM | POA: Diagnosis not present

## 2017-01-14 DIAGNOSIS — H35033 Hypertensive retinopathy, bilateral: Secondary | ICD-10-CM | POA: Diagnosis not present

## 2017-01-14 DIAGNOSIS — E113313 Type 2 diabetes mellitus with moderate nonproliferative diabetic retinopathy with macular edema, bilateral: Secondary | ICD-10-CM

## 2017-01-14 DIAGNOSIS — E11311 Type 2 diabetes mellitus with unspecified diabetic retinopathy with macular edema: Secondary | ICD-10-CM | POA: Diagnosis not present

## 2017-01-14 DIAGNOSIS — I10 Essential (primary) hypertension: Secondary | ICD-10-CM | POA: Diagnosis not present

## 2017-01-14 DIAGNOSIS — H33302 Unspecified retinal break, left eye: Secondary | ICD-10-CM

## 2017-01-20 ENCOUNTER — Other Ambulatory Visit: Payer: Self-pay | Admitting: *Deleted

## 2017-01-20 NOTE — Patient Outreach (Signed)
[place] [facility]) Care Management  01/20/2017  Kathleen [name] 1950/12/10 [id number]  RN Health Coach attempted #2 follow up outreach call to patient.  Patient was unavailable. HIPPA compliance voicemail message left with return callback number.  Plan: RN will call patient again within 14 days.  [place] Care Management [phone]

## 2017-01-21 ENCOUNTER — Other Ambulatory Visit: Payer: Self-pay | Admitting: *Deleted

## 2017-01-21 ENCOUNTER — Ambulatory Visit: Payer: Self-pay | Admitting: *Deleted
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
## 2017-01-25 ENCOUNTER — Ambulatory Visit: Payer: Self-pay | Admitting: *Deleted

## 2017-02-19 ENCOUNTER — Ambulatory Visit: Payer: Self-pay | Admitting: *Deleted

## 2017-02-22 ENCOUNTER — Other Ambulatory Visit: Payer: Self-pay | Admitting: Student in an Organized Health Care Education/Training Program

## 2017-02-22 DIAGNOSIS — E1169 Type 2 diabetes mellitus with other specified complication: Secondary | ICD-10-CM

## 2017-02-24 ENCOUNTER — Other Ambulatory Visit: Payer: Self-pay | Admitting: *Deleted

## 2017-02-24 NOTE — Patient Outreach (Signed)
[place] [facility]) Care Management  02/24/2017  Kathleen [name] June 05, 1950 [id number]   RN Health Coach attempted #1 follow up outreach call to patient.  Patient was unavailable. HIPPA compliance voicemail message left with return callback number.  Plan: RN will call patient again within 14 days.  [place] Care Management [phone]

## 2017-02-25 ENCOUNTER — Encounter (INDEPENDENT_AMBULATORY_CARE_PROVIDER_SITE_OTHER): Payer: Medicare HMO | Admitting: Ophthalmology

## 2017-02-25 DIAGNOSIS — E11311 Type 2 diabetes mellitus with unspecified diabetic retinopathy with macular edema: Secondary | ICD-10-CM | POA: Diagnosis not present

## 2017-02-25 DIAGNOSIS — E113313 Type 2 diabetes mellitus with moderate nonproliferative diabetic retinopathy with macular edema, bilateral: Secondary | ICD-10-CM | POA: Diagnosis not present

## 2017-02-25 DIAGNOSIS — H33302 Unspecified retinal break, left eye: Secondary | ICD-10-CM | POA: Diagnosis not present

## 2017-02-25 DIAGNOSIS — H43813 Vitreous degeneration, bilateral: Secondary | ICD-10-CM | POA: Diagnosis not present

## 2017-02-25 DIAGNOSIS — I10 Essential (primary) hypertension: Secondary | ICD-10-CM | POA: Diagnosis not present

## 2017-02-25 DIAGNOSIS — H35033 Hypertensive retinopathy, bilateral: Secondary | ICD-10-CM

## 2017-02-27 ENCOUNTER — Other Ambulatory Visit: Payer: Self-pay | Admitting: Internal Medicine

## 2017-03-02 ENCOUNTER — Other Ambulatory Visit: Payer: Self-pay | Admitting: *Deleted

## 2017-03-02 ENCOUNTER — Other Ambulatory Visit: Payer: Self-pay | Admitting: Internal Medicine

## 2017-03-02 NOTE — Patient Outreach (Signed)
[place] [facility]) Care Management  03/02/2017  Kathleen [name] [birth year] [id number]   RN Health Coach attempted #2 follow up outreach call to patient.  Patient was unavailable. Unable to leave voicemail message. Mailbox full.  Plan: RN will call patient again within 14 days.  [place] Care Management [phone]

## 2017-03-02 NOTE — Patient Outreach (Signed)
Kathleen [name]) Care Management  03/02/2017   Kathleen [name] Sep 26, 1950 [id number]  RN Health Coach received return telephone call From patient.  Hipaa compliance verified. Per patient she has been under a lot of stress. Her sister and niece expired. Per patient she has been trying to catch the [employer] bus because her car was being repaired. The patient had to postpone her gastric surgery. Her sister and her are planning to have the surgery done at the same time. Her sister came down with pneumonia and had to postpone hers also. The patient is looking at rescheduling the surgery for a later date.  Patient is scheduled to talk with the cardiologist on the 04/08/2017. She has a foot exam next week. Her fasting blood sugar this am was 123. Per patient stated that the highest she has seen is 137. She has not had any hyper or hypoglycemia reactions. Patient is taking medications as prescribed. Patient is maintaining her diet. She has had no falls. Patient has agreed to follow up outreach calls.   Current Medications:  Current Outpatient Medications  Medication Sig Dispense Refill  . ACCU-CHEK FASTCLIX LANCETS MISC TEST BLOOD SUGAR THREE TIMES DAILY 306 each 1  . amitriptyline (ELAVIL) 50 MG tablet TAKE 0.5 TABLETS (25 MG TOTAL) BY MOUTH AT BEDTIME. 90 tablet 0  . apixaban (ELIQUIS) 5 MG TABS tablet Take 1 tablet (5 mg total) by mouth 2 (two) times daily. 60 tablet 11  . BD INSULIN SYRINGE ULTRAFINE 31G X 15/64" 1 ML MISC USE TO INJECT INSULIN ONE TIME A DAY 100 each 5  . Blood Glucose Monitoring Suppl (ACCU-CHEK NANO SMARTVIEW) W/DEVICE KIT Check blood sugar 3 times daily 1 kit 0  . bumetanide (BUMEX) 1 MG tablet Take 2 mg (two tablets) in the AM. Take 1 mg (1 tablet) at night. 270 tablet 3  . diclofenac sodium (VOLTAREN) 1 % GEL Apply 4 g topically 4 (four) times daily. 100 g 2  . gabapentin (NEURONTIN) 300 MG capsule Take 300-600 mg by mouth 2 (two) times daily. 321m by mouth in the  morning and 6069mby mouth at night    . glucose blood (ACCU-CHEK SMARTVIEW) test strip TEST BLOOD SUGAR THREE TIMES DAILY ICD-10 E11.9 100 each 11  . insulin detemir (LEVEMIR) 100 UNIT/ML injection Inject 0.78 mLs (78 Units total) into the skin at bedtime. 30 mL 5  . Insulin Pen Needle (PEN NEEDLES) 32G X 5 MM MISC 0.6 mg by Does not apply route daily. Use 1 time daily to inject victoza. diag code E11.9 insulin dependent 90 each 6  . liraglutide (VICTOZA) 18 MG/3ML SOPN Inject 0.2 mLs (1.2 mg total) into the skin daily. 9 mL 3  . metFORMIN (GLUCOPHAGE) 1000 MG tablet TAKE 1 TABLET (1,000 MG TOTAL) BY MOUTH 2 (TWO) TIMES DAILY WITH A MEAL. 180 tablet 3  . Multiple Vitamins-Minerals (HEALTHY EYES PO) Take 1 tablet by mouth daily. Reported on 05/17/2015    . NON FORMULARY Place 2 L into the nose daily. Wears with exertion and qhs    . omeprazole (PRILOSEC) 20 MG capsule TAKE 1 CAPSULE BY MOUTH EVERY DAY 90 capsule 3   No current facility-administered medications for this visit.     Functional Status:  In your present state of health, do you have any difficulty performing the following activities: 03/02/2017 01/21/2017  Hearing? N N  Vision? N N  Comment - -  Difficulty concentrating or making decisions? N N  Walking or climbing stairs?  Y Y  Dressing or bathing? N N  Doing errands, shopping? N N  Preparing Food and eating ? N N  Using the Toilet? N N  In the past six months, have you accidently leaked urine? N N  Do you have problems with loss of bowel control? N N  Managing your Medications? N N  Managing your Finances? N N  Housekeeping or managing your Housekeeping? N N  Some recent data might be hidden    Fall/Depression Screening: Fall Risk  03/02/2017 01/21/2017 01/12/2017  Falls in the past year? No No No  Number falls in past yr: - 1 -  Comment - - -  Injury with Fall? - No -  Comment - - -  Risk Factor Category  High Fall Risk High Fall Risk -  Risk for fall due to : Impaired  balance/gait;Impaired mobility;History of fall(s) Impaired balance/gait;Impaired mobility;History of fall(s) -  Risk for fall due to: Comment - - -  Follow up - Falls evaluation completed;Education provided;Falls prevention discussed -   PHQ 2/9 Scores 03/02/2017 03/02/2017 01/21/2017 01/12/2017 12/09/2016 11/11/2016 10/20/2016  PHQ - 2 Score 0 0 0 0 0 0 0   THN CM Care Plan Problem One     Most Recent Value  Care Plan Problem One  Knowledge deficit in self management of diabetes  Role Documenting the Problem One  [place] for Problem One  Active  THN Long Term Goal   Patient A1C will remain in the 6.5 range with the upcoming gastric sleeve surgery within the next 90 days  THN Long Term Goal Start Date  03/02/17  Interventions for Problem One [place] discussed with patient what the  A1C is.  RN discussed how A1C is affected by the blood sugars and the changes that will occur with the new diet that is required with the gastric sleeve surgery w up   [facility] CM Short Term Goal #1   patient will be able to verbalize obtaining appropiate fitting compression hose within the next 30 days  THN CM Short Term Goal #1 Start Date  03/02/17  Interventions for Short Term Goal #1  RN discussed getting the correct size compression hose especially for upcoming surgery. RN sent patient educational material on compression hose, How to order correct size and how to put them on. RN wil follow up with next discussion to see if patient obtained.. RN sent patient a brochure on places to get hose and measured properly  [facility] CM Short Term Goal #5   Patient will verbalize dicussing with cardiologist regarding upcoming surgery for approval within the next 30 days  THN CM Short Term Goal #5 Start Date  03/02/17  Interventions for Short Term Goal #5  RN discussed with patient about getting approval from Cardiologist and PCP for upcoming gastric surgery.  RN discussed with patient about going to the  educational classes on gastric sleeve surgery. RN will follow up with further discussion.       Assessment:  Patient fasting blood sugar today was 123 Patient had to postpone gastric surgery Patient is taking medications as prescribed Patient is making follow up appointments  Plan:  Patient has appointment with cardiologist for surgery approval on 04/08/2017 Patient has appointment for foot care next week Patient will discuss the gastric surgery with a local physician also RN discussed blood sugar changes with surgical diet RN discussed with patient about grief counseling RN will follow up  within the month of December RN will send 2019 Calendar book  [place] [facility] [phone]

## 2017-03-04 ENCOUNTER — Telehealth: Payer: Self-pay | Admitting: Internal Medicine

## 2017-03-04 ENCOUNTER — Ambulatory Visit: Payer: Self-pay | Admitting: *Deleted

## 2017-03-04 NOTE — Telephone Encounter (Signed)
closed

## 2017-03-04 NOTE — Telephone Encounter (Addendum)
Omeprazole refill sent in on 11/05-call made to pharmacy to confirm and they stated rx was picked on on 11/06.  Attempted to contact patient to confirm which medication she actually needs a refill on-no answer, message left on recorder.Despina Hidden Cassady11/8/20189:45 AM

## 2017-03-04 NOTE — Telephone Encounter (Signed)
omeprazole (PRILOSEC) 20 MG capsule, refill request @ [facility] on [address].

## 2017-03-04 NOTE — Telephone Encounter (Signed)
Received return call from patient-she states she only picked up her metformin on 11/06-she will double check and give me a call if she cant find it.Despina Hidden Cassady11/8/20189:58 AM

## 2017-03-04 NOTE — Telephone Encounter (Signed)
PATIENT CALLED TO LET us KNOW SHE FOUND THE BOTTLE OF MEDICATION SHE WAS MISSING.

## 2017-03-09 ENCOUNTER — Ambulatory Visit (INDEPENDENT_AMBULATORY_CARE_PROVIDER_SITE_OTHER): Payer: Medicare HMO | Admitting: Podiatry

## 2017-03-09 ENCOUNTER — Encounter: Payer: Self-pay | Admitting: Podiatry

## 2017-03-09 DIAGNOSIS — B351 Tinea unguium: Secondary | ICD-10-CM

## 2017-03-09 DIAGNOSIS — E1142 Type 2 diabetes mellitus with diabetic polyneuropathy: Secondary | ICD-10-CM | POA: Diagnosis not present

## 2017-03-09 DIAGNOSIS — M79675 Pain in left toe(s): Secondary | ICD-10-CM | POA: Diagnosis not present

## 2017-03-09 DIAGNOSIS — M79674 Pain in right toe(s): Secondary | ICD-10-CM

## 2017-03-09 NOTE — Progress Notes (Signed)
Patient ID: Kathleen [name], female   DOB: 1950-09-26, 66 y.o.   MRN: [id number]    Subjective: This patient presents again for schedule visit complaining of painful toenails and walking wearing shoes and is requesting nail debridement. Patient lives in assisted living facility  Objective: Orientated 3 Bilateral peripheral pitting edema DP PT pulses 1/4 bilaterally Capillary reflex immediate bilaterally Sensation to 10 g monofilament wire intact 2/5 right 1/5 left Vibratory sensation nonreactive bilaterally Ankle reflex equal and reactive bilaterally Scaling skin bilaterally Varus rotated fourth and fifth toes bilaterally No Open skin lesions bilaterally Atrophic skin with absent hair growth bilaterally The toenails are hypertrophic, elongated, incurvated, discolored and tender direct palpation 6-10 Manual motor testing dorsi flexion, plantar flexion, inversion, eversion 5/5 bilaterally Patient walks slowly with cane  Assessment: Diabeticwith peripheral neuropathy Decrease pedal pulses suggestive you have possible peripheral arterial disease without any open wounds Symptomatic onychomycoses 6-10  Plan: Debrided toenails 10 mechanically and electrically without any bleeding  Reappoint 3 months

## 2017-03-09 NOTE — Patient Instructions (Signed)

## 2017-03-26 ENCOUNTER — Other Ambulatory Visit: Payer: Self-pay | Admitting: Internal Medicine

## 2017-03-26 NOTE — Telephone Encounter (Signed)
Next appt scheduled  05/04/17 with PCP.

## 2017-03-27 ENCOUNTER — Other Ambulatory Visit: Payer: Self-pay | Admitting: Internal Medicine

## 2017-03-29 ENCOUNTER — Other Ambulatory Visit: Payer: Self-pay | Admitting: Cardiology

## 2017-03-29 NOTE — Telephone Encounter (Signed)
Next appt scheduled  05/04/17 with PCP.

## 2017-04-02 ENCOUNTER — Ambulatory Visit: Payer: Self-pay | Admitting: *Deleted

## 2017-04-06 ENCOUNTER — Other Ambulatory Visit: Payer: Self-pay | Admitting: *Deleted

## 2017-04-06 NOTE — Patient Outreach (Signed)
[facility]) Care Management  04/06/2017   Kathleen [name] 02-13-1951 [id number]  Subjective: RN Health Coach telephone call to patient.  Hipaa compliance verified. Per patient she is doing good. Her fasting blood sugar is 103 today. [facility]anna A[facility]. Per patient she still has some. She is unable to get out of her house due to the snow blocking her doors. RN discussed with patient about [facility]. Patient has an eye appoint and cardiology appointment this week. She is hoping the weather will melt the snow enough for her to get out. Patient has been unable to do her walks but plans to get back to the [facility] next week. Patient will be speaking to the cardiologist about the upcoming plans for the gastric surgery. Patient has not had any signs or symptoms of hypo or hyper glycemic reaction. Patient has discussed about being lonely in home and would like to have a dog. RN discussed with patient about dogs that cause less allergic reactions. Patient has agreed to follow up outreach calls.   Current Medications:  Current Outpatient Medications  Medication Sig Dispense Refill  . ACCU-CHEK FASTCLIX LANCETS MISC TEST BLOOD SUGAR THREE TIMES DAILY 306 each 1  . amitriptyline (ELAVIL) 50 MG tablet TAKE 0.5 TABLETS (25 MG TOTAL) BY MOUTH AT BEDTIME. 90 tablet 0  . BD INSULIN SYRINGE ULTRAFINE 31G X 15/64" 1 ML MISC USE TO INJECT INSULIN ONE TIME A DAY 100 each 5  . Blood Glucose Monitoring Suppl (ACCU-CHEK NANO SMARTVIEW) W/DEVICE KIT Check blood sugar 3 times daily 1 kit 0  . bumetanide (BUMEX) 1 MG tablet Take 2 mg (two tablets) in the AM. Take 1 mg (1 tablet) at night. 270 tablet 3  . ELIQUIS 5 MG TABS tablet TAKE 1 TABLET (5 MG TOTAL) BY MOUTH 2 (TWO) TIMES DAILY. 60 tablet 6  . gabapentin (NEURONTIN) 300 MG capsule TAKE 1 CAPSULE EVERY MORNING, TAKE 1 CAPSULE IN THE AFTERNOON, AND TAKE 2 CAPSULES EVERY NIGHT 360  capsule 1  . glucose blood (ACCU-CHEK SMARTVIEW) test strip TEST BLOOD SUGAR THREE TIMES DAILY ICD-10 E11.9 100 each 11  . insulin detemir (LEVEMIR) 100 UNIT/ML injection Inject 0.78 mLs (78 Units total) into the skin at bedtime. 30 mL 5  . Insulin Pen Needle (PEN NEEDLES) 32G X 5 MM MISC 0.6 mg by Does not apply route daily. Use 1 time daily to inject victoza. diag code E11.9 insulin dependent 90 each 6  . liraglutide (VICTOZA) 18 MG/3ML SOPN Inject 0.2 mLs (1.2 mg total) into the skin daily. 9 mL 3  . metFORMIN (GLUCOPHAGE) 1000 MG tablet TAKE 1 TABLET (1,000 MG TOTAL) BY MOUTH 2 (TWO) TIMES DAILY WITH A MEAL. 180 tablet 3  . Multiple Vitamins-Minerals (HEALTHY EYES PO) Take 1 tablet by mouth daily. Reported on 05/17/2015    . NON FORMULARY Place 2 L into the nose daily. Wears with exertion and qhs    . omeprazole (PRILOSEC) 20 MG capsule TAKE 1 CAPSULE BY MOUTH EVERY DAY 90 capsule 3  . VOLTAREN 1 % GEL APPLY 4 G TOPICALLY 4 (FOUR) TIMES DAILY. 100 g 2   No current facility-administered medications for this visit.     Functional Status:  In your present state of health, do you have any difficulty performing the following activities: 04/06/2017 03/02/2017  Hearing? N N  Vision? N N  Comment - -  Difficulty concentrating or making decisions?  N N  Walking or climbing stairs? Y Y  Dressing or bathing? N N  Doing errands, shopping? N N  Preparing Food and eating ? N N  Using the Toilet? N N  In the past six months, have you accidently leaked urine? N N  Do you have problems with loss of bowel control? N N  Managing your Medications? N N  Managing your Finances? N N  Housekeeping or managing your Housekeeping? N N  Some recent data might be hidden    Fall/Depression Screening: Fall Risk  04/06/2017 03/02/2017 01/21/2017  Falls in the past year? No No No  Number falls in past yr: - - 1  Comment - - -  Injury with Fall? No - No  Comment - - -  Risk Factor Category  High Fall Risk High  Fall Risk High Fall Risk  Risk for fall due to : Impaired balance/gait;History of fall(s);Impaired mobility Impaired balance/gait;Impaired mobility;History of fall(s) Impaired balance/gait;Impaired mobility;History of fall(s)  Risk for fall due to: Comment - - -  Follow up Falls evaluation completed - Falls evaluation completed;Education provided;Falls prevention discussed   PHQ 2/9 Scores 04/06/2017 03/02/2017 03/02/2017 01/21/2017 01/12/2017 12/09/2016 11/11/2016  PHQ - 2 Score 0 0 0 0 0 0 0   THN CM Care Plan Problem One     Most Recent Value  Care Plan Problem One  Knowledge deficit in self management of diabetes  Role Documenting the Problem One  Health Coach  THN CM Short Term Goal #1   patient will be able to verbalize obtaining appropiate fitting compression hose within the next 30 days  THN CM Short Term Goal #1 Start Date  04/06/17  Interventions for Short Term Goal #1  RN discussed getting the correct size compression hose especially for upcoming surgery. RN sent patient educational material on compression hose, How to order correct size and how to put them on. RN wil follow up with next discussion to see if patient obtained.. RN sent patient a brochure on places to get hose and measured properly  [facility] CM Short Term Goal #5   Patient will verbalize dicussing with cardiologist regarding upcoming surgery for approval within the next 30 days  THN CM Short Term Goal #5 Start Date  04/06/17  Interventions for Short Term Goal #5  RN discussed with patient about getting approval from Cardiologist and PCP for upcoming gastric surgery.  RN discussed with patient about going to the educational classes on gastric sleeve surgery. RN will follow up with further discussion.        Assessment:  Patient fasting blood sugar is 103 Patient has made follow up health maintenance appointments Plan:  Patient will see PCP on [phone] for A1C draw Patient will discuss upcoming plans for gastric surgery RN  discussed with patient about eye care and foot care Patient has eye for 04/09/2017 RN will follow up within the month of January  Maribeth Jiles [place] Care Management [phone]

## 2017-04-08 ENCOUNTER — Ambulatory Visit: Payer: Medicare HMO | Admitting: Cardiology

## 2017-04-09 ENCOUNTER — Encounter (INDEPENDENT_AMBULATORY_CARE_PROVIDER_SITE_OTHER): Payer: Medicare HMO | Admitting: Ophthalmology

## 2017-04-16 ENCOUNTER — Encounter (INDEPENDENT_AMBULATORY_CARE_PROVIDER_SITE_OTHER): Payer: Medicare HMO | Admitting: Ophthalmology

## 2017-04-16 DIAGNOSIS — H33302 Unspecified retinal break, left eye: Secondary | ICD-10-CM

## 2017-04-16 DIAGNOSIS — H43813 Vitreous degeneration, bilateral: Secondary | ICD-10-CM | POA: Diagnosis not present

## 2017-04-16 DIAGNOSIS — E11311 Type 2 diabetes mellitus with unspecified diabetic retinopathy with macular edema: Secondary | ICD-10-CM | POA: Diagnosis not present

## 2017-04-16 DIAGNOSIS — E113313 Type 2 diabetes mellitus with moderate nonproliferative diabetic retinopathy with macular edema, bilateral: Secondary | ICD-10-CM | POA: Diagnosis not present

## 2017-04-16 DIAGNOSIS — H35033 Hypertensive retinopathy, bilateral: Secondary | ICD-10-CM

## 2017-04-16 DIAGNOSIS — I10 Essential (primary) hypertension: Secondary | ICD-10-CM | POA: Diagnosis not present

## 2017-05-04 ENCOUNTER — Other Ambulatory Visit: Payer: Self-pay

## 2017-05-04 ENCOUNTER — Ambulatory Visit (INDEPENDENT_AMBULATORY_CARE_PROVIDER_SITE_OTHER): Payer: Medicare HMO | Admitting: Internal Medicine

## 2017-05-04 ENCOUNTER — Encounter: Payer: Self-pay | Admitting: Internal Medicine

## 2017-05-04 VITALS — BP 109/42 | HR 65 | Temp 97.8°F | Ht 60.0 in | Wt 262.6 lb

## 2017-05-04 DIAGNOSIS — Z794 Long term (current) use of insulin: Secondary | ICD-10-CM

## 2017-05-04 DIAGNOSIS — Z79899 Other long term (current) drug therapy: Secondary | ICD-10-CM | POA: Diagnosis not present

## 2017-05-04 DIAGNOSIS — Z6841 Body Mass Index (BMI) 40.0 and over, adult: Secondary | ICD-10-CM

## 2017-05-04 DIAGNOSIS — Z7901 Long term (current) use of anticoagulants: Secondary | ICD-10-CM

## 2017-05-04 DIAGNOSIS — E785 Hyperlipidemia, unspecified: Secondary | ICD-10-CM | POA: Diagnosis not present

## 2017-05-04 DIAGNOSIS — E1122 Type 2 diabetes mellitus with diabetic chronic kidney disease: Secondary | ICD-10-CM | POA: Diagnosis not present

## 2017-05-04 DIAGNOSIS — E669 Obesity, unspecified: Secondary | ICD-10-CM | POA: Diagnosis not present

## 2017-05-04 DIAGNOSIS — E11649 Type 2 diabetes mellitus with hypoglycemia without coma: Secondary | ICD-10-CM

## 2017-05-04 DIAGNOSIS — I4892 Unspecified atrial flutter: Secondary | ICD-10-CM

## 2017-05-04 DIAGNOSIS — E1169 Type 2 diabetes mellitus with other specified complication: Secondary | ICD-10-CM | POA: Diagnosis not present

## 2017-05-04 DIAGNOSIS — N183 Chronic kidney disease, stage 3 unspecified: Secondary | ICD-10-CM

## 2017-05-04 DIAGNOSIS — I5032 Chronic diastolic (congestive) heart failure: Secondary | ICD-10-CM

## 2017-05-04 DIAGNOSIS — I10 Essential (primary) hypertension: Secondary | ICD-10-CM

## 2017-05-04 DIAGNOSIS — Z0000 Encounter for general adult medical examination without abnormal findings: Secondary | ICD-10-CM

## 2017-05-04 DIAGNOSIS — I129 Hypertensive chronic kidney disease with stage 1 through stage 4 chronic kidney disease, or unspecified chronic kidney disease: Secondary | ICD-10-CM

## 2017-05-04 LAB — GLUCOSE, CAPILLARY
Glucose-Capillary: 56 mg/dL — ABNORMAL LOW (ref 65–99)
Glucose-Capillary: 87 mg/dL (ref 65–99)

## 2017-05-04 LAB — POCT GLYCOSYLATED HEMOGLOBIN (HGB A1C): Hemoglobin A1C: 6.6

## 2017-05-04 MED ORDER — INSULIN DETEMIR 100 UNIT/ML ~~LOC~~ SOLN: 72.0000 [IU] | Freq: Every day | SUBCUTANEOUS | 5 refills | Status: DC

## 2017-05-04 MED ORDER — ROSUVASTATIN CALCIUM 20 MG PO TABS: 20.0000 mg | ORAL_TABLET | Freq: Every day | ORAL | 1 refills | Status: DC

## 2017-05-04 NOTE — Assessment & Plan Note (Signed)
-  We will check hepatitis C antibodies today as part of routine screening

## 2017-05-04 NOTE — Assessment & Plan Note (Signed)
-  This problem is chronic and stable -Patient denies any urinary complaints at this time -We will check her BMP today  -no further workup for now

## 2017-05-04 NOTE — Assessment & Plan Note (Signed)
-  This problem is chronic and stable -Patient denies palpitations or lightheadedness or syncope -On exam patient has a regular rate and rhythm -We will continue Eliquis 5 mg twice daily for anticoagulation

## 2017-05-04 NOTE — Assessment & Plan Note (Signed)
BP Readings from Last 3 Encounters:  05/04/17 (!) 109/42  01/12/17 (!) 135/49  12/22/16 108/60    Lab Results  Component Value Date   NA 143 12/22/2016   K 3.9 12/22/2016   CREATININE 1.63 (H) 12/22/2016    Assessment: Blood pressure control:  Well-controlled Progress toward BP goal:   At goal Comments: Patient is compliant with Bumex 1 mg twice daily  Plan: Medications:  continue current medications Educational resources provided: brochure(denies need ) Self management tools provided:   Other plans: We will check BMP today

## 2017-05-04 NOTE — Assessment & Plan Note (Signed)
-  Patient has lost approximately 20 pounds over the last year -She continues to follow a diet as well as exercise daily -She would like to pursue possible gastric bypass to help her reduce more weight -I have referred her to a bariatric surgeon on her prior visit -She will first follow up with her cardiologist to make sure that she is a candidate for this and then follow-up with the bariatric surgeon

## 2017-05-04 NOTE — Progress Notes (Signed)
   Subjective:    Patient ID: Kathleen [name], female    DOB: 06-12-1950, 67 y.o.   MRN: [id number]  HPI  I have seen and examined this patient.  Patient is here for routine follow-up of her diabetes and hypertension.  Patient states that she feels well and has no new complaints at this time.  She was supposed to follow-up with the bariatric surgeon for possible gastric bypass but states that she has yet to do this.  She states that she also needs to follow-up with her cardiologist prior to this to see if she would be a candidate for this.   Review of Systems  Constitutional: Negative.   HENT: Negative.   Respiratory: Negative.   Cardiovascular: Positive for leg swelling. Negative for chest pain and palpitations.  Gastrointestinal: Negative.   Musculoskeletal: Negative.   Skin: Negative.   Neurological: Negative.   Psychiatric/Behavioral: Negative.        Objective:   Physical Exam  Constitutional: She is oriented to person, place, and time. She appears well-developed and well-nourished.  HENT:  Head: Normocephalic and atraumatic.  Mouth/Throat: No oropharyngeal exudate.  Neck: Neck supple.  Cardiovascular: Normal rate, regular rhythm and normal heart sounds.  Pulmonary/Chest: Effort normal and breath sounds normal. No respiratory distress. She has no wheezes.  Abdominal: Soft. Bowel sounds are normal. She exhibits no distension. There is no tenderness.  Musculoskeletal: Normal range of motion. She exhibits edema.  1+ bilateral lower extremity pitting edema  Lymphadenopathy:    She has no cervical adenopathy.  Neurological: She is alert and oriented to person, place, and time.  Skin: Skin is warm. No rash noted. No erythema.  Psychiatric: She has a normal mood and affect. Her behavior is normal.          Assessment & Plan:  Please see problem based charting for assessment and plan:

## 2017-05-04 NOTE — Assessment & Plan Note (Addendum)
Lab Results  Component Value Date   HGBA1C 6.6 05/04/2017   HGBA1C 6.5 01/12/2017   HGBA1C 8.2 10/20/2016     Assessment: Diabetes control:  Well controlled Progress toward A1C goal:   At goal Comments: We will continue with metformin 1000 mg twice daily, Victoza 1.2 mg daily and Levemir.  Patient was noted to have some low blood sugars in the 60s and we will reduce the dose of her Levemir to 72 units  Plan: Medications:  We will decrease Levemir to 72 units given episodes of hypoglycemia Home glucose monitoring: Frequency:   Timing:   Instruction/counseling given: reminded to bring blood glucose meter & log to each visit, reminded to bring medications to each visit and discussed diet Educational resources provided: brochure(denies need ) Self management tools provided: copy of home glucose meter download Other plans: We will check BMP today, diabetic foot exam done today

## 2017-05-04 NOTE — Progress Notes (Signed)
Hypoglycemic Event  CBG: 56  Treatment: Glucose Gel  Symptoms: None  Follow-up CBG: Time: 9:25 CBG Result: 87  Possible Reasons for Event: Did not eat enough food  Comments/MD notified:  Dr. Dareen Piano aware of.    Jobe Mutch, [employer]

## 2017-05-04 NOTE — Patient Instructions (Signed)
-   It was a pleasure seeing you today - Please follow up in 3 months - Your BP is well controlled! Keep up the great work - Your diabetes is at goal. You have had some episodes of lower blood sugars.  - We will reduce your insulin to 72 units - I have started you on a cholesterol medication - We will check some blood work on you today - Please follow up with your cardiologist next month - Please follow up with the bariatric surgeon and see if you will be a candidate for a gastric bypass - You are doing a great job! Keep up the great work!

## 2017-05-04 NOTE — Assessment & Plan Note (Signed)
-  We will check a lipid profile on her today -Given her history of hypertension and diabetes she has a high ASCVD risk -I will start her on a high intensity statin (Crestor 20 mg) today -We will have her follow-up in 3 months and check her LFTs at that time -Patient instructed to call if she has any adverse effects

## 2017-05-05 ENCOUNTER — Encounter: Payer: Self-pay | Admitting: Internal Medicine

## 2017-05-05 LAB — HEPATITIS C ANTIBODY: Hep C Virus Ab: <0.1 s/co ratio (ref 0.0–0.9)

## 2017-05-05 LAB — BMP8+ANION GAP
Anion Gap: 17 mmol/L (ref 10.0–18.0)
BUN / CREAT RATIO: 21 (ref 12–28)
BUN: 32 mg/dL — ABNORMAL HIGH (ref 8–27)
CALCIUM: 9.6 mg/dL (ref 8.7–10.3)
CHLORIDE: 99 mmol/L (ref 96–106)
CO2: 29 mmol/L (ref 20–29)
Creatinine, Ser: 1.56 mg/dL — ABNORMAL HIGH (ref 0.57–1.00)
GFR calc non Af Amer: 35 mL/min/{1.73_m2} — ABNORMAL LOW (ref >59)
GFR, EST AFRICAN AMERICAN: 40 mL/min/{1.73_m2} — AB (ref >59)
GLUCOSE: 78 mg/dL (ref 65–99)
POTASSIUM: 4.3 mmol/L (ref 3.5–5.2)
Sodium: 145 mmol/L — ABNORMAL HIGH (ref 134–144)

## 2017-05-05 LAB — LIPID PANEL
CHOL/HDL RATIO: 3.4 ratio (ref 0.0–4.4)
Cholesterol, Total: 129 mg/dL (ref 100–199)
HDL: 38 mg/dL — ABNORMAL LOW (ref >39)
LDL Calculated: 76 mg/dL (ref 0–99)
Triglycerides: 77 mg/dL (ref 0–149)
VLDL Cholesterol Cal: 15 mg/dL (ref 5–40)

## 2017-05-06 ENCOUNTER — Other Ambulatory Visit: Payer: Self-pay | Admitting: *Deleted

## 2017-05-06 NOTE — Patient Outreach (Addendum)
[facility]) Care Management  05/06/2017   Kathleen [name] 1951/02/05 [id number]  Subjective: RN Health Coach telephone call to patient.  Hipaa compliance verified. Per patient she is doing good. Patient reported having some low blood sugars in the 50's. RN discussed hypoglycemia and action plan. Patient fasting blood sugar today was 135. Patient A1C is 6.6. Patient reported exercising at home on treadmill and walking x 30 minutes 4 x week. Patient uses her oxygen prn at night. Patient is monitoring he intake and trying to eat appropriate foods for her diet. Patient is still looking at the bariatric surgery but worried that she could not afford the food that she is to eat . Patient has agreed to follow up outreach calls.    Current Medications:  Current Outpatient Medications  Medication Sig Dispense Refill  . ACCU-CHEK FASTCLIX LANCETS MISC TEST BLOOD SUGAR THREE TIMES DAILY 306 each 1  . amitriptyline (ELAVIL) 50 MG tablet TAKE 0.5 TABLETS (25 MG TOTAL) BY MOUTH AT BEDTIME. 90 tablet 0  . BD INSULIN SYRINGE ULTRAFINE 31G X 15/64" 1 ML MISC USE TO INJECT INSULIN ONE TIME A DAY 100 each 5  . Blood Glucose Monitoring Suppl (ACCU-CHEK NANO SMARTVIEW) W/DEVICE KIT Check blood sugar 3 times daily 1 kit 0  . bumetanide (BUMEX) 1 MG tablet Take 2 mg (two tablets) in the AM. Take 1 mg (1 tablet) at night. 270 tablet 3  . ELIQUIS 5 MG TABS tablet TAKE 1 TABLET (5 MG TOTAL) BY MOUTH 2 (TWO) TIMES DAILY. 60 tablet 6  . gabapentin (NEURONTIN) 300 MG capsule TAKE 1 CAPSULE EVERY MORNING, TAKE 1 CAPSULE IN THE AFTERNOON, AND TAKE 2 CAPSULES EVERY NIGHT 360 capsule 1  . glucose blood (ACCU-CHEK SMARTVIEW) test strip TEST BLOOD SUGAR THREE TIMES DAILY ICD-10 E11.9 100 each 11  . insulin detemir (LEVEMIR) 100 UNIT/ML injection Inject 0.72 mLs (72 Units total) into the skin at bedtime. 30 mL 5  . Insulin Pen Needle (PEN NEEDLES) 32G X 5 MM MISC 0.6 mg by Does not apply route daily. Use 1 time  daily to inject victoza. diag code E11.9 insulin dependent 90 each 6  . liraglutide (VICTOZA) 18 MG/3ML SOPN Inject 0.2 mLs (1.2 mg total) into the skin daily. 9 mL 3  . metFORMIN (GLUCOPHAGE) 1000 MG tablet TAKE 1 TABLET (1,000 MG TOTAL) BY MOUTH 2 (TWO) TIMES DAILY WITH A MEAL. 180 tablet 3  . Multiple Vitamins-Minerals (HEALTHY EYES PO) Take 1 tablet by mouth daily. Reported on 05/17/2015    . NON FORMULARY Place 2 L into the nose daily. Wears with exertion and qhs    . omeprazole (PRILOSEC) 20 MG capsule TAKE 1 CAPSULE BY MOUTH EVERY DAY 90 capsule 3  . rosuvastatin (CRESTOR) 20 MG tablet Take 1 tablet (20 mg total) by mouth at bedtime. 90 tablet 1  . VOLTAREN 1 % GEL APPLY 4 G TOPICALLY 4 (FOUR) TIMES DAILY. 100 g 2   No current facility-administered medications for this visit.     Functional Status:  In your present state of health, do you have any difficulty performing the following activities: 05/06/2017 05/04/2017  Hearing? N N  Vision? N N  Difficulty concentrating or making decisions? N N  Walking or climbing stairs? Y Y  Dressing or bathing? N N  Doing errands, shopping? N N  Preparing Food and eating ? N -  Using the Toilet? N -  In the past six months, have you accidently leaked urine? N -  Do you have problems with loss of bowel control? N -  Managing your Medications? N -  Managing your Finances? N -  Housekeeping or managing your Housekeeping? N -  Some recent data might be hidden    Fall/Depression Screening: Fall Risk  05/06/2017 05/04/2017 04/06/2017  Falls in the past year? No No No  Number falls in past yr: - - -  Comment - - -  Injury with Fall? - - No  Comment - - -  Risk Factor Category  - - High Fall Risk  Risk for fall due to : Impaired balance/gait;Impaired mobility - Impaired balance/gait;History of fall(s);Impaired mobility  Risk for fall due to: Comment - - -  Follow up - - Falls evaluation completed    [facility] CM Care Plan Problem One     Most Recent  Value  Care Plan Problem One  Knowledge deficit in self management of diabetes  Care Plan for Problem One  Active  THN Long Term Goal   Patient A1C will remain in the 6.5 range with the upcoming gastric sleeve surgery within the next 90 days  THN Long Term Goal Start Date  05/06/17  Interventions for Problem One [place] discussed with patient what the  A1C is.  RN discussed how A1C is affected by the blood sugars and the changes that will occur with the new diet that is required with the gastric sleeve surgery w up   [facility] CM Short Term Goal #1   patient will be able to verbalize obtaining appropiate fitting compression hose within the next 30 days  THN CM Short Term Goal #1 Start Date  05/06/17  Interventions for Short Term Goal #1  RN discussed getting the correct size compression hose especially for upcoming surgery. RN sent patient educational material on compression hose, How to order correct size and how to put them on. RN wil follow up with next discussion to see if patient obtained.. RN sent patient a brochure on places to get hose and measured properly  THN CM Short Term Goal #2   Patient will not have any hypoglycemic reactions withi the next 30 days  THN CM Short Term Goal #2 Start Date  05/06/17  Interventions for Short Term Goal #2  RN discussed signs and symptoms of hypoglycemia. RN discussed with patient about getting glucose tablets or gel to keep in pocketbook. RN decreased dosage of insulin.  RN will follow up outreach for further discussion      Assessment:  Patient had hypoglycemic episodes A1C 6.6 on 05/04/2016 Patient is doing home routine exercise  Patient adhering to diet Patient is adhering to medications as per physician order Patient uses oxygen prn at night Patient is anxious about post  bariatric surgery food and supplement costs Plan:  RN discussed hypoglycemia reactions and action plan RN sent educational material on Stage  1-3 bariatric  diet RN discussed with patient about call [place] and checking on supplements through mail order for costs Patient received pneumonia shot Patient made follow up appointment with Podiatrist [phone] Patient made follow up appointment with  Cardiologist [phone] Patient made follow up appointment with  PCP [phone] Patient made follow up appointment with Eye doctor [phone] RN will follow up within the month of February  Per [place] [facility] [phone]

## 2017-05-07 ENCOUNTER — Other Ambulatory Visit: Payer: Self-pay | Admitting: Internal Medicine

## 2017-05-07 DIAGNOSIS — E1169 Type 2 diabetes mellitus with other specified complication: Secondary | ICD-10-CM

## 2017-05-07 NOTE — Telephone Encounter (Signed)
Next appt scheduled  08/31/17 with PCP.

## 2017-05-10 ENCOUNTER — Other Ambulatory Visit: Payer: Self-pay | Admitting: Internal Medicine

## 2017-05-10 DIAGNOSIS — E1169 Type 2 diabetes mellitus with other specified complication: Secondary | ICD-10-CM

## 2017-05-28 ENCOUNTER — Encounter (INDEPENDENT_AMBULATORY_CARE_PROVIDER_SITE_OTHER): Payer: Medicare HMO | Admitting: Ophthalmology

## 2017-05-28 DIAGNOSIS — I10 Essential (primary) hypertension: Secondary | ICD-10-CM

## 2017-05-28 DIAGNOSIS — H43813 Vitreous degeneration, bilateral: Secondary | ICD-10-CM | POA: Diagnosis not present

## 2017-05-28 DIAGNOSIS — E113313 Type 2 diabetes mellitus with moderate nonproliferative diabetic retinopathy with macular edema, bilateral: Secondary | ICD-10-CM

## 2017-05-28 DIAGNOSIS — H33302 Unspecified retinal break, left eye: Secondary | ICD-10-CM | POA: Diagnosis not present

## 2017-05-28 DIAGNOSIS — E11311 Type 2 diabetes mellitus with unspecified diabetic retinopathy with macular edema: Secondary | ICD-10-CM

## 2017-05-28 DIAGNOSIS — H35033 Hypertensive retinopathy, bilateral: Secondary | ICD-10-CM

## 2017-05-28 LAB — HM DIABETES EYE EXAM

## 2017-06-01 ENCOUNTER — Encounter: Payer: Self-pay | Admitting: *Deleted

## 2017-06-07 ENCOUNTER — Other Ambulatory Visit: Payer: Self-pay | Admitting: *Deleted

## 2017-06-07 ENCOUNTER — Other Ambulatory Visit: Payer: Self-pay | Admitting: Internal Medicine

## 2017-06-07 DIAGNOSIS — I5032 Chronic diastolic (congestive) heart failure: Secondary | ICD-10-CM

## 2017-06-07 NOTE — Patient Outreach (Signed)
[facility]) Care Management  06/07/2017   Kathleen [name] 1950-12-27 [id number]  Subjective: RN Health Coach telephone call to patient.  Hipaa compliance verified. Per patient her fasting blood sugar was 112. Patient has not had any hypoglycemic reactions. Patient A1C is at her goal of 6.6. Patient was planning on having the gastric sleeve surgery. Her family has discussed there feelings on not approving. Her family has told her they would not support her having the surgery and that she could die. The patient has now became very fearful of proceeding. Patient is now wanting to look at other ways for weight loss.  Patient has ordered compression hose to wear during exercise. RN discussed with patient bout calling in the month of April and patient agreed.    Current Medications:  Current Outpatient Medications  Medication Sig Dispense Refill  . ACCU-CHEK FASTCLIX LANCETS MISC TEST BLOOD SUGAR THREE TIMES DAILY 306 each 1  . ACCU-CHEK SMARTVIEW test strip TEST BLOOD SUGAR THREE TIMES DAILY  300 each 11  . amitriptyline (ELAVIL) 50 MG tablet TAKE 0.5 TABLETS (25 MG TOTAL) BY MOUTH AT BEDTIME. 90 tablet 0  . BD INSULIN SYRINGE ULTRAFINE 31G X 15/64" 1 ML MISC USE TO INJECT INSULIN ONE TIME A DAY 100 each 5  . Blood Glucose Monitoring Suppl (ACCU-CHEK NANO SMARTVIEW) W/DEVICE KIT Check blood sugar 3 times daily 1 kit 0  . bumetanide (BUMEX) 1 MG tablet Take 2 mg (two tablets) in the AM. Take 1 mg (1 tablet) at night. 270 tablet 3  . ELIQUIS 5 MG TABS tablet TAKE 1 TABLET (5 MG TOTAL) BY MOUTH 2 (TWO) TIMES DAILY. 60 tablet 6  . gabapentin (NEURONTIN) 300 MG capsule TAKE 1 CAPSULE EVERY MORNING, TAKE 1 CAPSULE IN THE AFTERNOON, AND TAKE 2 CAPSULES EVERY NIGHT 360 capsule 1  . insulin detemir (LEVEMIR) 100 UNIT/ML injection Inject 0.72 mLs (72 Units total) into the skin at bedtime. 30 mL 5  . Insulin Pen Needle (PEN NEEDLES) 32G X 5 MM MISC 0.6 mg by Does not apply route daily. Use 1  time daily to inject victoza. diag code E11.9 insulin dependent 90 each 6  . liraglutide (VICTOZA) 18 MG/3ML SOPN Inject 0.2 mLs (1.2 mg total) into the skin daily. 9 mL 3  . metFORMIN (GLUCOPHAGE) 1000 MG tablet TAKE 1 TABLET (1,000 MG TOTAL) BY MOUTH 2 (TWO) TIMES DAILY WITH A MEAL. 180 tablet 3  . Multiple Vitamins-Minerals (HEALTHY EYES PO) Take 1 tablet by mouth daily. Reported on 05/17/2015    . NON FORMULARY Place 2 L into the nose daily. Wears with exertion and qhs    . omeprazole (PRILOSEC) 20 MG capsule TAKE 1 CAPSULE BY MOUTH EVERY DAY 90 capsule 3  . rosuvastatin (CRESTOR) 20 MG tablet Take 1 tablet (20 mg total) by mouth at bedtime. 90 tablet 1  . VOLTAREN 1 % GEL APPLY 4 G TOPICALLY 4 (FOUR) TIMES DAILY. 100 g 2   No current facility-administered medications for this visit.     Functional Status:  In your present state of health, do you have any difficulty performing the following activities: 06/07/2017 05/06/2017  Hearing? N N  Vision? N N  Difficulty concentrating or making decisions? N N  Walking or climbing stairs? Y Y  Dressing or bathing? N N  Doing errands, shopping? N N  Preparing Food and eating ? N N  Using the Toilet? N N  In the past six months, have you accidently leaked urine?  N N  Do you have problems with loss of bowel control? N N  Managing your Medications? N N  Managing your Finances? N N  Housekeeping or managing your Housekeeping? N N  Some recent data might be hidden    Fall/Depression Screening: Fall Risk  06/07/2017 05/06/2017 05/04/2017  Falls in the past year? No No No  Number falls in past yr: - - -  Comment - - -  Injury with Fall? No - -  Comment - - -  Risk Factor Category  High Fall Risk - -  Risk for fall due to : Impaired balance/gait;Impaired mobility Impaired balance/gait;Impaired mobility -  Risk for fall due to: Comment - - -  Follow up Falls evaluation completed - -   PHQ 2/9 Scores 06/07/2017 05/06/2017 05/04/2017 04/06/2017  03/02/2017 03/02/2017 01/21/2017  PHQ - 2 Score 0 0 0 0 0 0 0   THN CM Care Plan Problem One     Most Recent Value  Care Plan Problem One  Knowledge deficit in self management of diabetes  Role Documenting the Problem One  [place] for Problem One  Active  THN Long Term Goal   Patient A1C will remain in the 6.5 range with the upcoming gastric sleeve surgery within the next 90 days  Interventions for Problem One Long Term Goal  Patient has discussed fears about surgery/Patient is looking into other alternatives for weight loss/ RN discussed patient talk with physician/ Patient is looking into weight watchers/RN discussed with patient about talking with a nutritionist/RN Health Coach will follow up with further discussion  THN CM Short Term Goal #1   patient will be able to verbalize that she has called and made appointment with the surgeon for her gastric sleeve surgery within the next 30 days  Interventions for Short Term Goal #1  Patient received information on the surgery and would like to proceed but family has voiced extreme fear that is causing patient to have fear of surgery. Patient is looking into alternative weight loss treatments  THN CM Short Term Goal #2   Patient will not have any hypoglycemic reactions within the next 30 days  THN CM Short Term Goal #2 Met Date  06/07/17  [facility] CM Short Term Goal #3  patient will report looking into other weight loss measures within the next 30 days  THN CM Short Term Goal #3 Start Date  06/07/17  Interventions for Short [place] Goal #3  Patient will report calling weight watchers and looking into program/ Patient will look at finding a [employer] since [employer] [facility]/Patient will report talking with a nutritionist/ RN will follow up with further discussion and closure of case      Assessment:  Fasting blood sugar is 112 Patient has not had any hypoglycemic reactions since last outreach Pt is now indecisive about gastric sleeve surgery Patient  has ordered specialty hose Plan:  RN discussed the Gastric Sleeve surgery RN discussed alternative weight loss Patient will check into weight watchers Patient will discuss with Dr Patient will talk with nutritionist Patient will look at finding another place to exercise RN will follow up within the month of April RN will look into case closure  [place] [facility] [phone]

## 2017-06-08 ENCOUNTER — Ambulatory Visit (INDEPENDENT_AMBULATORY_CARE_PROVIDER_SITE_OTHER): Payer: Medicare HMO | Admitting: Podiatry

## 2017-06-08 DIAGNOSIS — M79674 Pain in right toe(s): Secondary | ICD-10-CM

## 2017-06-08 DIAGNOSIS — E1142 Type 2 diabetes mellitus with diabetic polyneuropathy: Secondary | ICD-10-CM

## 2017-06-08 DIAGNOSIS — B351 Tinea unguium: Secondary | ICD-10-CM

## 2017-06-08 DIAGNOSIS — M79675 Pain in left toe(s): Secondary | ICD-10-CM | POA: Diagnosis not present

## 2017-06-08 NOTE — Telephone Encounter (Signed)
Can you confirm the dose of Bumex patient is taking please? Is it one tab bid or 2 in the AM and one in the PM? Thank you

## 2017-06-08 NOTE — Telephone Encounter (Signed)
Called pt - stated she takes Bumex 2 tabs in the morning and 1 tab in the evening.

## 2017-06-08 NOTE — Progress Notes (Signed)
Subjective: 67 y.o. returns the office today for painful, elongated, thickened toenails which she cannot trim herself. Denies any redness or drainage around the nails. Denies any acute changes since last appointment and no new complaints today. Denies any systemic complaints such as fevers, chills, nausea, vomiting.   PCP: Aldine Contes, MD  Objective: AAO 3, NAD DP/PT pulses palpable 1/4, CRT less than 3 seconds Protective sensation decreased with Simms Weinstein monofilament Nails hypertrophic, dystrophic, elongated, brittle, discolored 10. There is tenderness overlying the nails 1-5 bilaterally. There is no surrounding erythema or drainage along the nail sites. No open lesions or pre-ulcerative lesions are identified. No other areas of tenderness bilateral lower extremities. No overlying edema, erythema, increased warmth. No pain with calf compression, swelling, warmth, erythema.  Assessment: Patient presents with symptomatic onychomycosis  Plan: -Treatment options including alternatives, risks, complications were discussed -Nails sharply debrided 10 without complication/bleeding. -Discussed daily foot inspection. If there are any changes, to call the office immediately.  -Follow-up in 3 months or sooner if any problems are to arise. In the meantime, encouraged to call the office with any questions, concerns, changes symptoms.  Celesta Gentile, DPM

## 2017-06-12 ENCOUNTER — Other Ambulatory Visit: Payer: Self-pay | Admitting: Internal Medicine

## 2017-06-12 DIAGNOSIS — G629 Polyneuropathy, unspecified: Secondary | ICD-10-CM

## 2017-06-13 ENCOUNTER — Encounter: Payer: Self-pay | Admitting: Cardiology

## 2017-06-13 NOTE — Progress Notes (Signed)
Cardiology Office Note:    Date:  06/14/2017   ID:  Kathleen [name], DOB 09-14-1950, MRN [id number]  PCP:  Aldine Contes, MD  Cardiologist:  No primary care provider on file.    Referring MD: Aldine Contes, MD   Chief Complaint  Patient presents with  . Congestive Heart Failure  . Hypertension  . Atrial Flutter    History of Present Illness:    Kathleen [name] is a 67 y.o. female with a hx of Hypertension, morbid obesity, type 2 diabetes, dyslipidemia, obesity hypoventilation syndrome(on home O2 with no OSA by PSG but showed nocturnal hypoxemia), CKD stage III and chronic diastolic CHF. She also has permanent atrial flutter with  CHADSVASC=3 on Eliquis 5 mg BID.  She is here today for followup and is doing well.  She denies any chest pain or pressure, SOB, DOE, PND, orthopnea, dizziness, palpitations or syncope. She occasionally gets dizziness if getting up fast or turning her head too fast.  She is compliant with her meds and is tolerating meds with no SE.    Past Medical History:  Diagnosis Date  . Atrial flutter ([place]) 04/06/2016  . Bradycardia 04/06/2016   type 1 second degree AV block  . Chronic diastolic (congestive) heart failure ([place]) 01/10/2015  . Degenerative joint disease of hand   . Diabetes mellitus   . Dyslipidemia   . Fecal occult blood test positive   . GERD (gastroesophageal reflux disease)   . Headache   . Hypertension   . Inadequate material resources   . Irritable bowel syndrome   . Obesity   . Post-menopausal bleeding   . Shortness of breath dyspnea    multifactorial from obesity, deconditioning, obesity hypoventilation syndrome    Past Surgical History:  Procedure Laterality Date  . CHOLECYSTECTOMY    . EYE SURGERY     left lens implant s/p cataracts  . OTHER SURGICAL HISTORY     right shoulder tendon repair 05/2011    Current Medications: Current Meds  Medication Sig  . ACCU-CHEK FASTCLIX LANCETS MISC TEST BLOOD SUGAR THREE TIMES  DAILY  . ACCU-CHEK SMARTVIEW test strip TEST BLOOD SUGAR THREE TIMES DAILY   . amitriptyline (ELAVIL) 50 MG tablet TAKE 0.5 TABLETS (25 MG TOTAL) BY MOUTH AT BEDTIME.  . BD INSULIN SYRINGE ULTRAFINE 31G X 15/64" 1 ML MISC USE TO INJECT INSULIN ONE TIME A DAY  . Blood Glucose Monitoring Suppl (ACCU-CHEK NANO SMARTVIEW) W/DEVICE KIT Check blood sugar 3 times daily  . bumetanide (BUMEX) 1 MG tablet TAKE 2 tablets in the AM and one in the PM  . ELIQUIS 5 MG TABS tablet TAKE 1 TABLET (5 MG TOTAL) BY MOUTH 2 (TWO) TIMES DAILY.  [place] gabapentin (NEURONTIN) 300 MG capsule TAKE 1 CAPSULE EVERY MORNING, TAKE 1 CAPSULE IN THE AFTERNOON, AND TAKE 2 CAPSULES EVERY NIGHT  . insulin detemir (LEVEMIR) 100 UNIT/ML injection Inject 0.72 mLs (72 Units total) into the skin at bedtime.  . Insulin Pen Needle (PEN NEEDLES) 32G X 5 MM MISC 0.6 mg by Does not apply route daily. Use 1 time daily to inject victoza. diag code E11.9 insulin dependent  . liraglutide (VICTOZA) 18 MG/3ML SOPN Inject 0.2 mLs (1.2 mg total) into the skin daily.  . metFORMIN (GLUCOPHAGE) 1000 MG tablet TAKE 1 TABLET (1,000 MG TOTAL) BY MOUTH 2 (TWO) TIMES DAILY WITH A MEAL.  . Multiple Vitamins-Minerals (HEALTHY EYES PO) Take 1 tablet by mouth daily. Reported on 05/17/2015  . NON FORMULARY Place 2  L into the nose daily. Wears with exertion and qhs  . omeprazole (PRILOSEC) 20 MG capsule TAKE 1 CAPSULE BY MOUTH EVERY DAY  . rosuvastatin (CRESTOR) 20 MG tablet Take 1 tablet (20 mg total) by mouth at bedtime.  . VOLTAREN 1 % GEL APPLY 4 G TOPICALLY 4 (FOUR) TIMES DAILY.     Allergies:   Doxycycline   Social History   Socioeconomic History  . Marital status: Divorced    Spouse name: None  . Number of children: 2  . Years of education: None  . Highest education level: None  Social Needs  . Financial resource strain: None  . Food insecurity - worry: None  . Food insecurity - inability: None  . Transportation needs - medical: None  .  Transportation needs - non-medical: None  Occupational History  . Occupation: unemployed    Fish farm manager: UNEMPLOYED  Tobacco Use  . Smoking status: Never Smoker  . Smokeless tobacco: Never Used  Substance and Sexual Activity  . Alcohol use: No    Alcohol/week: 0.0 oz  . Drug use: No  . Sexual activity: Not Currently  Other Topics Concern  . None  Social History Narrative   Single.   Divorced x2.   Has 2 children, by two different fathers.   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 May apply for disability/SSI given her pain in her hands which has made interviewing for jobs difficult.   Never Smoked   Alcohol use-no   Drug use-no   Regular exercise-yes      Financial assistance approved for 100% discount at [facility] and has [name] card per [employer]   02/18/2010              Family History: The patient's family history includes Diabetes in her mother and sister; Obesity in her mother; Stroke in her father. There is no history of Colon cancer.  ROS:   Please see the history of present illness.    Review of Systems  Endocrine: Positive for cold intolerance.  Musculoskeletal: Positive for joint swelling.  Neurological: Positive for loss of balance.    All other systems reviewed and negative.   EKGs/Labs/Other Studies Reviewed:    The following studies were reviewed today: nonew  EKG:  EKG is not ordered today.    Recent Labs: 10/14/2016: NT-Pro BNP 1,452 05/04/2017: BUN 32; Creatinine, Ser 1.56; Potassium 4.3; Sodium 145   Recent Lipid Panel    Component Value Date/Time   CHOL 129 05/04/2017 0917   TRIG 77 05/04/2017 0917   HDL 38 (L) 05/04/2017 0917   CHOLHDL 3.4 05/04/2017 0917   CHOLHDL 3.5 06/07/2014 1027   VLDL 16 06/07/2014 1027   LDLCALC 76 05/04/2017 0917    Physical Exam:    VS:  BP (!) 138/54   Pulse (!) 57   Ht 5' (1.524 m)   Wt 261 lb 9.6 oz (118.7 kg)   BMI 51.09 kg/m       Wt Readings from Last 3 Encounters:  06/14/17 261 lb 9.6 oz (118.7 kg)  05/04/17 262 lb 9.6 oz (119.1 kg)  01/12/17 263 lb 8 oz (119.5 kg)     GEN:  Well nourished, well developed in no acute distress HEENT: Normal NECK: No JVD; No carotid bruits LYMPHATICS: No lymphadenopathy CARDIAC: RRR, no murmurs, rubs, gallops RESPIRATORY:  Clear to auscultation without rales, wheezing or rhonchi  ABDOMEN: Soft, non-tender, non-distended MUSCULOSKELETAL:  Trace edema; No deformity  SKIN: Warm and dry NEUROLOGIC:  Alert and oriented x 3 PSYCHIATRIC:  Normal affect   ASSESSMENT:    1. Chronic diastolic congestive heart failure ([place])   2. Essential hypertension   3. Mobitz type 1 second degree atrioventricular block   4. Atrial flutter, unspecified type ([place])   5. CKD (chronic kidney disease) stage 3, GFR 30-59 ml/min ([facility])    PLAN:    In order of problems listed above:  1.  Chronic diastolic CHF - she appears euvolemic on exam today.  She will continue on Bumex 62m qam and 11mqpm.  2.  HTN - BP is well controlled on exam today.  She is currently on no antihypertensive meds.   3.  Bradycardia with Mobitz type 1 second degree AV block - she is asymptomatic.   4.  Paroxysmal atrial flutter - she is maintaining NSR on exam today.  She will continue on Eliquis 7m67mID for CHADS2VASC score of 5.  I will get a BMET and CBC.  She has not had any problems with significant bleeding but does bruise easily.   5.  CKD stage 3 - I will check a BMET today since she is on diuretics.    Medication Adjustments/Labs and Tests Ordered: Current medicines are reviewed at length with the patient today.  Concerns regarding medicines are outlined above.  No orders of the defined types were placed in this encounter.  No orders of the defined types were placed in this encounter.   Signed, TraFransico HimD  06/14/2017 1:26 PM    [place]

## 2017-06-14 ENCOUNTER — Ambulatory Visit (INDEPENDENT_AMBULATORY_CARE_PROVIDER_SITE_OTHER): Payer: Medicare HMO | Admitting: Cardiology

## 2017-06-14 ENCOUNTER — Encounter: Payer: Self-pay | Admitting: Cardiology

## 2017-06-14 VITALS — BP 138/54 | HR 57 | Ht 60.0 in | Wt 261.6 lb

## 2017-06-14 DIAGNOSIS — I10 Essential (primary) hypertension: Secondary | ICD-10-CM | POA: Diagnosis not present

## 2017-06-14 DIAGNOSIS — I441 Atrioventricular block, second degree: Secondary | ICD-10-CM

## 2017-06-14 DIAGNOSIS — I4892 Unspecified atrial flutter: Secondary | ICD-10-CM | POA: Diagnosis not present

## 2017-06-14 DIAGNOSIS — N183 Chronic kidney disease, stage 3 unspecified: Secondary | ICD-10-CM

## 2017-06-14 DIAGNOSIS — I5032 Chronic diastolic (congestive) heart failure: Secondary | ICD-10-CM

## 2017-06-14 NOTE — Patient Instructions (Signed)
Medication Instructions:  Your physician recommends that you continue on your current medications as directed. Please refer to the Current Medication list given to you today.  If you need a refill on your cardiac medications, please contact your pharmacy first.  Labwork: Today for BMET and complete blood count   Testing/Procedures: None ordered   Follow-Up: Your physician wants you to follow-up in: 6 months with PA. You will receive a reminder letter in the mail two months in advance. If you don't receive a letter, please call our office to schedule the follow-up appointment.  Your physician wants you to follow-up in: 1 year with Dr. Radford Pax. You will receive a reminder letter in the mail two months in advance. If you don't receive a letter, please call our office to schedule the follow-up appointment.  Any Other Special Instructions Will Be Listed Below (If Applicable).   Thank you for choosing [facility] [place], RN  [phone]  If you need a refill on your cardiac medications before your next appointment, please call your pharmacy. \

## 2017-06-15 LAB — BASIC METABOLIC PANEL
BUN / CREAT RATIO: 24 (ref 12–28)
BUN: 46 mg/dL — ABNORMAL HIGH (ref 8–27)
CO2: 27 mmol/L (ref 20–29)
CREATININE: 1.88 mg/dL — AB (ref 0.57–1.00)
Calcium: 9.5 mg/dL (ref 8.7–10.3)
Chloride: 100 mmol/L (ref 96–106)
GFR calc Af Amer: 32 mL/min/{1.73_m2} — ABNORMAL LOW (ref >59)
GFR calc non Af Amer: 28 mL/min/{1.73_m2} — ABNORMAL LOW (ref >59)
Glucose: 117 mg/dL — ABNORMAL HIGH (ref 65–99)
POTASSIUM: 3.8 mmol/L (ref 3.5–5.2)
SODIUM: 146 mmol/L — AB (ref 134–144)

## 2017-06-15 LAB — CBC
HEMATOCRIT: 35.2 % (ref 34.0–46.6)
HEMOGLOBIN: 12 g/dL (ref 11.1–15.9)
MCH: 30.8 pg (ref 26.6–33.0)
MCHC: 34.1 g/dL (ref 31.5–35.7)
MCV: 90 fL (ref 79–97)
Platelets: 177 10*3/uL (ref 150–379)
RBC: 3.9 x10E6/uL (ref 3.77–5.28)
RDW: 14.7 % (ref 12.3–15.4)
WBC: 9.2 10*3/uL (ref 3.4–10.8)

## 2017-06-17 ENCOUNTER — Telehealth: Payer: Self-pay | Admitting: Cardiology

## 2017-06-17 DIAGNOSIS — I5032 Chronic diastolic (congestive) heart failure: Secondary | ICD-10-CM

## 2017-06-17 DIAGNOSIS — N183 Chronic kidney disease, stage 3 unspecified: Secondary | ICD-10-CM

## 2017-06-17 MED ORDER — BUMETANIDE 1 MG PO TABS: ORAL_TABLET | ORAL | 3 refills | Status: DC

## 2017-06-17 NOTE — Telephone Encounter (Signed)
Notes recorded by Sueanne Margarita, MD on 06/16/2017 at 8:40 PM EST Creatinine has increased - have patient hold Bumex 2/21 and then restart at 2mg  daily and repeat BMET in 1 week

## 2017-06-17 NOTE — Telephone Encounter (Signed)
Made patient aware of lab results. Patient states that she has already taken her bumex today. Patient will hold her bumex tomorrow 2/22 and resume on Saturday taking only 2 mg daily. Patient will come in for repeat BMET on 3/1. Patient verbalizes understanding and is in agreement with the plan.

## 2017-06-17 NOTE — Telephone Encounter (Signed)
New message   Patient returning call for nurse about labs

## 2017-06-25 ENCOUNTER — Other Ambulatory Visit: Payer: Medicare HMO | Admitting: *Deleted

## 2017-06-25 DIAGNOSIS — N183 Chronic kidney disease, stage 3 unspecified: Secondary | ICD-10-CM

## 2017-06-25 DIAGNOSIS — I5032 Chronic diastolic (congestive) heart failure: Secondary | ICD-10-CM

## 2017-06-25 LAB — BASIC METABOLIC PANEL
BUN/Creatinine Ratio: 24 (ref 12–28)
BUN: 40 mg/dL — AB (ref 8–27)
CO2: 28 mmol/L (ref 20–29)
CREATININE: 1.7 mg/dL — AB (ref 0.57–1.00)
Calcium: 9 mg/dL (ref 8.7–10.3)
Chloride: 99 mmol/L (ref 96–106)
GFR calc Af Amer: 36 mL/min/{1.73_m2} — ABNORMAL LOW (ref >59)
GFR calc non Af Amer: 31 mL/min/{1.73_m2} — ABNORMAL LOW (ref >59)
Glucose: 91 mg/dL (ref 65–99)
Potassium: 4.3 mmol/L (ref 3.5–5.2)
SODIUM: 142 mmol/L (ref 134–144)

## 2017-06-28 ENCOUNTER — Telehealth: Payer: Self-pay

## 2017-06-28 DIAGNOSIS — Z79899 Other long term (current) drug therapy: Secondary | ICD-10-CM

## 2017-06-28 MED ORDER — BUMETANIDE 1 MG PO TABS: 1.0000 mg | ORAL_TABLET | Freq: Every day | ORAL | 0 refills | Status: DC

## 2017-06-28 NOTE — Telephone Encounter (Signed)
Notes recorded by Teressa Senter, RN on 06/28/2017 at 9:15 AM EST Patient instructed to DECREASE bumex to 1 mg once a day and call if you develop any SOB or edema. Patient schedule for repeat bmet on 3/11. Patient in agreement with plan and thankful for the call  Notes recorded by Sueanne Margarita, MD on 06/25/2017 at 4:26 PM EST Please have her decrease Bumex to 1mg  daily and repeat BMEt in 1 week - she will need to call if she develops SOB or edema

## 2017-07-01 ENCOUNTER — Other Ambulatory Visit: Payer: Self-pay | Admitting: Internal Medicine

## 2017-07-01 DIAGNOSIS — E1169 Type 2 diabetes mellitus with other specified complication: Secondary | ICD-10-CM

## 2017-07-01 DIAGNOSIS — Z794 Long term (current) use of insulin: Principal | ICD-10-CM

## 2017-07-01 NOTE — Telephone Encounter (Signed)
Next appt scheduled 5/7 with PCP. 

## 2017-07-05 ENCOUNTER — Other Ambulatory Visit: Payer: Medicare HMO | Admitting: *Deleted

## 2017-07-05 DIAGNOSIS — Z79899 Other long term (current) drug therapy: Secondary | ICD-10-CM | POA: Diagnosis not present

## 2017-07-05 LAB — BASIC METABOLIC PANEL
BUN/Creatinine Ratio: 20 (ref 12–28)
BUN: 38 mg/dL — ABNORMAL HIGH (ref 8–27)
CHLORIDE: 104 mmol/L (ref 96–106)
CO2: 25 mmol/L (ref 20–29)
CREATININE: 1.9 mg/dL — AB (ref 0.57–1.00)
Calcium: 8.8 mg/dL (ref 8.7–10.3)
GFR calc Af Amer: 31 mL/min/{1.73_m2} — ABNORMAL LOW (ref >59)
GFR calc non Af Amer: 27 mL/min/{1.73_m2} — ABNORMAL LOW (ref >59)
GLUCOSE: 113 mg/dL — AB (ref 65–99)
POTASSIUM: 4.1 mmol/L (ref 3.5–5.2)
SODIUM: 143 mmol/L (ref 134–144)

## 2017-07-06 ENCOUNTER — Telehealth: Payer: Self-pay | Admitting: Cardiology

## 2017-07-06 NOTE — Telephone Encounter (Signed)
Patient returning for lab results

## 2017-07-06 NOTE — Telephone Encounter (Signed)
Patient made aware of lab results. Informed patient that labs have been faxed to her Primary MD to follow up due to elevated creatinine level. Patient verbalized understanding and thankful for the call

## 2017-07-06 NOTE — Progress Notes (Signed)
Spoke with the pt and she is sch for the [facility] on 07/21/2017.

## 2017-07-09 ENCOUNTER — Encounter (INDEPENDENT_AMBULATORY_CARE_PROVIDER_SITE_OTHER): Payer: Medicare HMO | Admitting: Ophthalmology

## 2017-07-09 DIAGNOSIS — E11311 Type 2 diabetes mellitus with unspecified diabetic retinopathy with macular edema: Secondary | ICD-10-CM | POA: Diagnosis not present

## 2017-07-09 DIAGNOSIS — H43813 Vitreous degeneration, bilateral: Secondary | ICD-10-CM

## 2017-07-09 DIAGNOSIS — I10 Essential (primary) hypertension: Secondary | ICD-10-CM | POA: Diagnosis not present

## 2017-07-09 DIAGNOSIS — H35033 Hypertensive retinopathy, bilateral: Secondary | ICD-10-CM | POA: Diagnosis not present

## 2017-07-09 DIAGNOSIS — H33302 Unspecified retinal break, left eye: Secondary | ICD-10-CM

## 2017-07-09 DIAGNOSIS — E113313 Type 2 diabetes mellitus with moderate nonproliferative diabetic retinopathy with macular edema, bilateral: Secondary | ICD-10-CM

## 2017-07-12 ENCOUNTER — Other Ambulatory Visit: Payer: Self-pay | Admitting: Internal Medicine

## 2017-07-12 DIAGNOSIS — E1169 Type 2 diabetes mellitus with other specified complication: Secondary | ICD-10-CM

## 2017-07-21 ENCOUNTER — Ambulatory Visit (INDEPENDENT_AMBULATORY_CARE_PROVIDER_SITE_OTHER): Payer: Medicare HMO | Admitting: Internal Medicine

## 2017-07-21 ENCOUNTER — Encounter: Payer: Self-pay | Admitting: Internal Medicine

## 2017-07-21 VITALS — BP 125/52 | HR 52 | Temp 98.4°F | Wt 267.9 lb

## 2017-07-21 DIAGNOSIS — I5032 Chronic diastolic (congestive) heart failure: Secondary | ICD-10-CM

## 2017-07-21 DIAGNOSIS — E1142 Type 2 diabetes mellitus with diabetic polyneuropathy: Secondary | ICD-10-CM

## 2017-07-21 DIAGNOSIS — Z79899 Other long term (current) drug therapy: Secondary | ICD-10-CM | POA: Diagnosis not present

## 2017-07-21 DIAGNOSIS — E1169 Type 2 diabetes mellitus with other specified complication: Secondary | ICD-10-CM

## 2017-07-21 DIAGNOSIS — Z794 Long term (current) use of insulin: Secondary | ICD-10-CM

## 2017-07-21 DIAGNOSIS — G629 Polyneuropathy, unspecified: Secondary | ICD-10-CM

## 2017-07-21 MED ORDER — ACCU-CHEK NANO SMARTVIEW W/DEVICE KIT: PACK | 0 refills | Status: DC

## 2017-07-21 MED ORDER — GABAPENTIN 300 MG PO CAPS: ORAL_CAPSULE | ORAL | 0 refills | Status: DC

## 2017-07-21 MED ORDER — AMITRIPTYLINE HCL 25 MG PO TABS: 25.0000 mg | ORAL_TABLET | Freq: Every day | ORAL | 0 refills | Status: DC

## 2017-07-21 NOTE — Patient Instructions (Addendum)
Kathleen [name] it was nice seeing you today.  -Continue taking Bumex 1 mg daily. I am checking your labs today and will let you know if we need to make any dose changes.  -Please restrict fluid intake to less than 2 liters per day.  -Please follow a low sodium diet.  -Wear compression stockings.  -Elevate your legs when sitting.  -Amitriptyline and Gabapentin have been refilled. Prescriptions have been sent to [place].  -Please return to the clinic on 08/31/2017. You have an appointment with Dr. Dareen Piano at 8:45 am.

## 2017-07-22 LAB — BMP8+ANION GAP
ANION GAP: 18 mmol/L (ref 10.0–18.0)
BUN/Creatinine Ratio: 27 (ref 12–28)
BUN: 52 mg/dL — AB (ref 8–27)
CALCIUM: 9.6 mg/dL (ref 8.7–10.3)
CO2: 24 mmol/L (ref 20–29)
CREATININE: 1.92 mg/dL — AB (ref 0.57–1.00)
Chloride: 102 mmol/L (ref 96–106)
GFR calc Af Amer: 31 mL/min/{1.73_m2} — ABNORMAL LOW (ref >59)
GFR, EST NON AFRICAN AMERICAN: 27 mL/min/{1.73_m2} — AB (ref >59)
Glucose: 105 mg/dL — ABNORMAL HIGH (ref 65–99)
Potassium: 4.5 mmol/L (ref 3.5–5.2)
Sodium: 144 mmol/L (ref 134–144)

## 2017-07-22 NOTE — Progress Notes (Signed)
   CC: Follow-up abnormal labs  HPI:  [name] is a 67 y.o. female with a past medical history of conditions listed below presenting to the clinic for follow-up of elevated creatinine. Please see problem based charting for the status of the patient's current and chronic medical conditions.   Past Medical History:  Diagnosis Date  . Atrial flutter ([place]) 04/06/2016  . Bradycardia 04/06/2016   type 1 second degree AV block  . Chronic diastolic (congestive) heart failure ([place]) 01/10/2015  . Degenerative joint disease of hand   . Diabetes mellitus   . Dyslipidemia   . Fecal occult blood test positive   . GERD (gastroesophageal reflux disease)   . Headache   . Hypertension   . Inadequate material resources   . Irritable bowel syndrome   . Obesity   . Post-menopausal bleeding   . Shortness of breath dyspnea    multifactorial from obesity, deconditioning, obesity hypoventilation syndrome   Review of Systems: Pertinent positives mentioned in HPI. Remainder of all ROS negative.   Physical Exam:  Vitals:   07/21/17 1047  BP: (!) 125/52  Pulse: (!) 52  Temp: 98.4 F (36.9 C)  TempSrc: Oral  SpO2: 94%  Weight: 267 lb 14.4 oz (121.5 kg)   Physical Exam  Constitutional: She is oriented to person, place, and time. She appears well-developed and well-nourished. No distress.  HENT:  Mouth/Throat: Oropharynx is clear and moist.  Eyes: Right eye exhibits no discharge. Left eye exhibits no discharge.  Cardiovascular: Normal rate, regular rhythm and intact distal pulses.  Pulmonary/Chest: Effort normal and breath sounds normal. No respiratory distress. She has no wheezes. She has no rales.  Abdominal: Soft. Bowel sounds are normal. She exhibits no distension. There is no tenderness.  Musculoskeletal:  +2 edema bilateral lower extremities  Neurological: She is alert and oriented to person, place, and time.  Skin: Skin is warm and dry.    Assessment & Plan:   See Encounters  Tab for problem based charting.  Patient seen with Dr. Dareen Piano

## 2017-07-22 NOTE — Assessment & Plan Note (Signed)
Prescription for a new blood glucose meter has been sent to her pharmacy as her old meter is not functioning. She has a follow-up with her PCP on 5/7 to discuss diabetes.

## 2017-07-22 NOTE — Assessment & Plan Note (Addendum)
Creatinine checked during recent labs done on July 05, 2017 was elevated at 1.9, baseline is 1.5-1.6.  She was previously taking Bumex 2 mg in the morning and 1 mg in the evening.  Patient states after she was found creatinine elevation, she was advised to take Bumex 1 mg once daily.  At present, she reports having a little bit of cough at night but not during the day.  Her weight is up from her previous visit, however, patient denies having any shortness of breath, lower extremity edema, or orthopnea.  She does have +2 bilateral lower extremity edema but lungs clear on exam.  Repeat labs at this visit showing creatinine 1.9 (no change) and BUN increased to 52.  Plan -Hold Bumex -Low-salt diet -Restrict fluid intake to less than 2 L/day -Advised her to wear compression stockings.  Patient states she is still waiting for [employer] to send her compression stockings. -Elevate legs when sitting -Repeat labs in 1 week  Addendum: Repeat labs showing creatinine 1.7 and potassium 5.6.  Spoke to the patient over the phone.  She is not currently taking any potassium supplement and is not on any medication that would raise her serum potassium level.  Reports having stable lower extremity edema.  She does not believe the lower extremity edema has worsened since her prior clinic visit.  States her compression stockings have been shipped and she is expecting to receive them by the end of this week.  She denies having any shortness of breath and continues to go to the gym.  Denies having any cough, orthopnea, or paroxysmal nocturnal dyspnea.  States she checked her weight at the gym 3 days ago and it was lower than the weight from her prior clinic visit.  I advised the patient to start taking Bumex 1 mg once a day for her lower extremity edema.  It would also help with her mild hyperkalemia.  She has an appointment with Dr. Dareen Piano on 08/31/2017 and will need repeat BMP at that visit.

## 2017-07-22 NOTE — Assessment & Plan Note (Signed)
This problem is chronic and stable.  Patient is requesting refills on gabapentin and amitriptyline.  Plan -Refill amitriptyline -Refill gabapentin

## 2017-07-23 ENCOUNTER — Other Ambulatory Visit: Payer: Self-pay | Admitting: *Deleted

## 2017-07-23 DIAGNOSIS — E118 Type 2 diabetes mellitus with unspecified complications: Secondary | ICD-10-CM

## 2017-07-23 MED ORDER — ACCU-CHEK NANO SMARTVIEW W/DEVICE KIT: PACK | 0 refills | Status: DC

## 2017-07-23 NOTE — Progress Notes (Signed)
Internal Medicine Clinic Attending  Case discussed with Dr. Rathoreat the time of the visit. We reviewed the resident's history and exam and pertinent patient test results. I agree with the assessment, diagnosis, and plan of care documented in the resident's note.  

## 2017-07-29 ENCOUNTER — Other Ambulatory Visit (INDEPENDENT_AMBULATORY_CARE_PROVIDER_SITE_OTHER): Payer: Medicare HMO

## 2017-07-29 DIAGNOSIS — I5032 Chronic diastolic (congestive) heart failure: Secondary | ICD-10-CM | POA: Diagnosis not present

## 2017-07-30 LAB — BMP8+ANION GAP
ANION GAP: 11 mmol/L (ref 10.0–18.0)
BUN/Creatinine Ratio: 21 (ref 12–28)
BUN: 37 mg/dL — AB (ref 8–27)
CALCIUM: 8.7 mg/dL (ref 8.7–10.3)
CO2: 20 mmol/L (ref 20–29)
Chloride: 107 mmol/L — ABNORMAL HIGH (ref 96–106)
Creatinine, Ser: 1.79 mg/dL — ABNORMAL HIGH (ref 0.57–1.00)
GFR calc Af Amer: 34 mL/min/{1.73_m2} — ABNORMAL LOW (ref >59)
GFR, EST NON AFRICAN AMERICAN: 29 mL/min/{1.73_m2} — AB (ref >59)
Glucose: 110 mg/dL — ABNORMAL HIGH (ref 65–99)
POTASSIUM: 5.6 mmol/L — AB (ref 3.5–5.2)
Sodium: 138 mmol/L (ref 134–144)

## 2017-08-03 ENCOUNTER — Other Ambulatory Visit: Payer: Self-pay | Admitting: *Deleted

## 2017-08-03 MED ORDER — ACCU-CHEK AVIVA PLUS W/DEVICE KIT: PACK | 0 refills | Status: DC

## 2017-08-03 MED ORDER — ACCU-CHEK SOFTCLIX LANCETS MISC: 12 refills | Status: DC

## 2017-08-03 MED ORDER — GLUCOSE BLOOD VI STRP: ORAL_STRIP | 12 refills | Status: DC

## 2017-08-05 ENCOUNTER — Other Ambulatory Visit: Payer: Self-pay | Admitting: *Deleted

## 2017-08-05 ENCOUNTER — Encounter: Payer: Self-pay | Admitting: *Deleted

## 2017-08-05 NOTE — Patient Outreach (Signed)
[place] [facility]) Care Management  08/05/2017   Kathleen [name] 06/17/1950 [id number]  RN Health Coach telephone call to patient.  [employer] compliance verified. Per patient she went to the Dr office and left her meter in the transport vehicle. Patient called  the transportation system and informed  they couldn't find it. Patient called Dr and had another meter ordered. Patient is still waiting on meter. RN told patient to keep checking on getting her meter because she is on oral agents and insulin. Last Dr visit patient had gained 6 pounds. RN noted patient had a slight cough. Patient stated she has edema in one of he legs. RN discussed with patient about the history of congestive heart failure. RN discussed daily weights. Patient informed Health coach that her scale does not work and that she weighs at the gym. RN told the patient to check the batteries. Patient is going to get some batteries to day and weigh each morning at home when she first gets up and document. RN went over the CHF zones and action plan. Patient has started going back to the gym 5 x a week. Per patient she had ordered some compression hose but have not tried them on. All the other hoses she had gotten  did not fit. RN explained to patient how to put them on. Patient will call RN back today if they do not fit. Patient will get a tape measure and RN will instruct on how to measure and will assist patient in getting properly fitting hose. Patient has agreed to further outreach calls  Current Medications:  Current Outpatient Medications  Medication Sig Dispense Refill  . ACCU-CHEK FASTCLIX LANCETS MISC TEST BLOOD SUGAR THREE TIMES DAILY 306 each 1  . ACCU-CHEK SOFTCLIX LANCETS lancets Use to check blood sugar three times daily. Diagnosis code E11.8 100 each 12  . amitriptyline (ELAVIL) 25 MG tablet Take 1 tablet (25 mg total) by mouth at bedtime. 90 tablet 0  . BD INSULIN SYRINGE ULTRAFINE 31G X 15/64" 1 ML MISC USE TO  INJECT INSULIN ONE TIME A DAY 100 each 5  . Blood Glucose Monitoring Suppl (ACCU-CHEK AVIVA PLUS) w/Device KIT Check blood sugars 3 times daily 1 kit 0  . Blood Glucose Monitoring Suppl (ACCU-CHEK NANO SMARTVIEW) w/Device KIT Use to check blood sugar 3 times daily. Dx E11.8 1 kit 0  . ELIQUIS 5 MG TABS tablet TAKE 1 TABLET (5 MG TOTAL) BY MOUTH 2 (TWO) TIMES DAILY. 60 tablet 6  . gabapentin (NEURONTIN) 300 MG capsule TAKE 1 CAPSULE EVERY MORNING, TAKE 1 CAPSULE IN THE AFTERNOON, AND TAKE 2 CAPSULES EVERY NIGHT 360 capsule 0  . glucose blood (ACCU-CHEK AVIVA PLUS) test strip Use to check blood sugar three times daily. Diagnosis code E11.8 100 each 12  . insulin detemir (LEVEMIR) 100 UNIT/ML injection Inject 0.72 mLs (72 Units total) into the skin at bedtime. 30 mL 5  . Insulin Pen Needle (PEN NEEDLES) 32G X 5 MM MISC 0.6 mg by Does not apply route daily. Use 1 time daily to inject victoza. diag code E11.9 insulin dependent 90 each 6  . metFORMIN (GLUCOPHAGE) 1000 MG tablet TAKE 1 TABLET (1,000 MG TOTAL) BY MOUTH 2 (TWO) TIMES DAILY WITH A MEAL. 180 tablet 3  . Multiple Vitamins-Minerals (HEALTHY EYES PO) Take 1 tablet by mouth daily. Reported on 05/17/2015    . NON FORMULARY Place 2 L into the nose daily. Wears with exertion and qhs    . omeprazole (PRILOSEC)  20 MG capsule TAKE 1 CAPSULE BY MOUTH EVERY DAY 90 capsule 3  . rosuvastatin (CRESTOR) 20 MG tablet Take 1 tablet (20 mg total) by mouth at bedtime. 90 tablet 1  . VICTOZA 18 MG/3ML SOPN INJECT (1.2 MG TOTAL) INTO THE SKIN DAILY. 9 pen 3  . VOLTAREN 1 % GEL APPLY 4 G TOPICALLY 4 (FOUR) TIMES DAILY. 100 g 2   No current facility-administered medications for this visit.     Functional Status:  In your present state of health, do you have any difficulty performing the following activities: 08/05/2017 06/07/2017  Hearing? N N  Vision? N N  Difficulty concentrating or making decisions? N N  Walking or climbing stairs? Y Y  Dressing or bathing? N  N  Doing errands, shopping? N N  Preparing Food and eating ? N N  Using the Toilet? N N  In the past six months, have you accidently leaked urine? N N  Do you have problems with loss of bowel control? N N  Managing your Medications? N N  Managing your Finances? N N  Housekeeping or managing your Housekeeping? N N  Some recent data might be hidden    Fall/Depression Screening: Fall Risk  08/05/2017 06/07/2017 05/06/2017  Falls in the past year? No No No  Number falls in past yr: - - -  Comment - - -  Injury with Fall? - No -  Comment - - -  Risk Factor Category  High Fall Risk High Fall Risk -  Risk for fall due to : Impaired balance/gait;Impaired mobility Impaired balance/gait;Impaired mobility Impaired balance/gait;Impaired mobility  Risk for fall due to: Comment - - -  Follow up Falls evaluation completed;Education provided Falls evaluation completed -   PHQ 2/9 Scores 08/05/2017 06/07/2017 05/06/2017 05/04/2017 04/06/2017 03/02/2017 03/02/2017  PHQ - 2 Score 0 0 0 0 0 0 0   THN CM Care Plan Problem One     Most Recent Value  Care Plan Problem One  Knowledge deficit in self management of diabetes  Role Documenting the Problem One  [place] for Problem One  Active  THN Long Term Goal   Patient will receive a new glucose meter within the next 30 days  THN Long Term Goal Start Date  08/05/17  Interventions for Problem One Long Term Goal  Patient has lost meter in transportation vehicle to Dr office. Patient had dr order meter. Patient has not received meter. RN discussed withpatient to call each week to check on meter until it comes. Patientis unable to check blood sugars and takes insulin and oral agents. RN discussed the importance of having a meter and knowing what the blood sugars. RN will follow up with patient.   THN CM Short Term Goal #1   Patient will make sure she gets properly fitted support hose within the next 30 days  THN CM Short Term Goal #1 Start Date  08/05/17   Interventions for Short Term Goal #1  Patient received stockings. Health coach instructed patient how to put on. Patient will call RN if stocking do not fit so patient can get measured and order stockings appropiately. RN will follow up with further discussion  THN CM Short Term Goal #2   Patient will report fixing home scales and weighing daily within the next 30 days  THN CM Short Term Goal #2 Start Date  08/05/17  Interventions for Short Term Goal #2  RN discussed the weight gain of patient. Patient  will go and get new batteries to troubleshoot problems with scale. Patient will contact health coach if not working, RN will follow up with further discssion      Assessment:  Patient does not have a glucometer Patient has new compression hose but have not put on Patient scale is not working Patient has returned to her 5 times a week exercise routine  Plan:  RN sent patient a congestive heart failure packet RN discussed fluid intake decrease RN discussed with patient how to measure 2 liters and counting dietary intake RN discussed the importance of daily weight and documenting Patient reordered a new glucometer RN discussed how to get compression hose on RN will close diabetes case when patient receives meter and can check blood sugars RN will work on reiterating  Congestive Heart zones and action plan RN will follow up outreach within the month of [place] Management [phone]

## 2017-08-23 ENCOUNTER — Other Ambulatory Visit: Payer: Self-pay | Admitting: Internal Medicine

## 2017-08-27 ENCOUNTER — Encounter (INDEPENDENT_AMBULATORY_CARE_PROVIDER_SITE_OTHER): Payer: Medicare HMO | Admitting: Ophthalmology

## 2017-08-31 ENCOUNTER — Telehealth: Payer: Self-pay | Admitting: Internal Medicine

## 2017-08-31 ENCOUNTER — Encounter: Payer: Self-pay | Admitting: Internal Medicine

## 2017-08-31 ENCOUNTER — Ambulatory Visit (INDEPENDENT_AMBULATORY_CARE_PROVIDER_SITE_OTHER): Payer: Medicare HMO | Admitting: Internal Medicine

## 2017-08-31 ENCOUNTER — Other Ambulatory Visit: Payer: Self-pay

## 2017-08-31 VITALS — BP 138/52 | HR 55 | Temp 97.9°F | Ht 60.0 in | Wt 266.3 lb

## 2017-08-31 DIAGNOSIS — K219 Gastro-esophageal reflux disease without esophagitis: Secondary | ICD-10-CM | POA: Diagnosis not present

## 2017-08-31 DIAGNOSIS — Z794 Long term (current) use of insulin: Secondary | ICD-10-CM

## 2017-08-31 DIAGNOSIS — Z7901 Long term (current) use of anticoagulants: Secondary | ICD-10-CM

## 2017-08-31 DIAGNOSIS — Z6841 Body Mass Index (BMI) 40.0 and over, adult: Secondary | ICD-10-CM

## 2017-08-31 DIAGNOSIS — I5032 Chronic diastolic (congestive) heart failure: Secondary | ICD-10-CM

## 2017-08-31 DIAGNOSIS — E1169 Type 2 diabetes mellitus with other specified complication: Secondary | ICD-10-CM | POA: Diagnosis not present

## 2017-08-31 DIAGNOSIS — N183 Chronic kidney disease, stage 3 unspecified: Secondary | ICD-10-CM

## 2017-08-31 DIAGNOSIS — I110 Hypertensive heart disease with heart failure: Secondary | ICD-10-CM

## 2017-08-31 DIAGNOSIS — I4892 Unspecified atrial flutter: Secondary | ICD-10-CM | POA: Diagnosis not present

## 2017-08-31 DIAGNOSIS — Z79899 Other long term (current) drug therapy: Secondary | ICD-10-CM

## 2017-08-31 DIAGNOSIS — E1142 Type 2 diabetes mellitus with diabetic polyneuropathy: Secondary | ICD-10-CM

## 2017-08-31 DIAGNOSIS — B353 Tinea pedis: Secondary | ICD-10-CM | POA: Diagnosis not present

## 2017-08-31 DIAGNOSIS — G629 Polyneuropathy, unspecified: Secondary | ICD-10-CM

## 2017-08-31 DIAGNOSIS — E785 Hyperlipidemia, unspecified: Secondary | ICD-10-CM | POA: Diagnosis not present

## 2017-08-31 DIAGNOSIS — Z9981 Dependence on supplemental oxygen: Secondary | ICD-10-CM | POA: Diagnosis not present

## 2017-08-31 DIAGNOSIS — J9611 Chronic respiratory failure with hypoxia: Secondary | ICD-10-CM

## 2017-08-31 DIAGNOSIS — J9610 Chronic respiratory failure, unspecified whether with hypoxia or hypercapnia: Secondary | ICD-10-CM | POA: Diagnosis not present

## 2017-08-31 DIAGNOSIS — I10 Essential (primary) hypertension: Secondary | ICD-10-CM

## 2017-08-31 DIAGNOSIS — E669 Obesity, unspecified: Secondary | ICD-10-CM

## 2017-08-31 LAB — POCT GLYCOSYLATED HEMOGLOBIN (HGB A1C): HEMOGLOBIN A1C: 6.8

## 2017-08-31 LAB — GLUCOSE, CAPILLARY: Glucose-Capillary: 119 mg/dL — ABNORMAL HIGH (ref 65–99)

## 2017-08-31 MED ORDER — METFORMIN HCL 1000 MG PO TABS: ORAL_TABLET | ORAL | 3 refills | Status: DC

## 2017-08-31 MED ORDER — AMITRIPTYLINE HCL 25 MG PO TABS: 25.0000 mg | ORAL_TABLET | Freq: Every day | ORAL | 1 refills | Status: DC

## 2017-08-31 MED ORDER — TERBINAFINE HCL 1 % EX CREA: 1.0000 "application " | TOPICAL_CREAM | Freq: Two times a day (BID) | CUTANEOUS | 0 refills | Status: DC

## 2017-08-31 MED ORDER — GABAPENTIN 300 MG PO CAPS: ORAL_CAPSULE | ORAL | 3 refills | Status: DC

## 2017-08-31 MED ORDER — ROSUVASTATIN CALCIUM 20 MG PO TABS: 20.0000 mg | ORAL_TABLET | Freq: Every day | ORAL | 1 refills | Status: DC

## 2017-08-31 NOTE — Assessment & Plan Note (Signed)
-  This problem is chronic and stable -Patient denies any lightheadedness or palpitations -On exam her heart rate was regular and she had a normal rhythm -We will continue with Eliquis for anti-coagulation for now

## 2017-08-31 NOTE — Assessment & Plan Note (Signed)
-  This problem is chronic and stable -Patient states that she takes her Prilosec daily -She says that she only occasionally gets symptoms of heartburn.  This happens usually when she eats something spicy -We discussed the possibility of her coming off Prilosec and she would like to try this.  I instructed her to stop her Prilosec and see if her symptoms of heartburn recur -If she has no further symptoms of heartburn we will discontinue this medication but if it does we will resume this medication  -No further work-up at this time

## 2017-08-31 NOTE — Assessment & Plan Note (Signed)
-  Patient noted to have mildly worsening bilateral lower extremity edema but no shortness of breath -Her lungs are clear to auscultation on exam and she was noted to have 2+ bilateral lower extremity pitting edema with the left slightly bigger than the right -Patient is currently on Bumex 1 mg once daily -We will check a BMP today and increase this to twice daily if possible -Patient to follow-up with cardiology as an outpatient -Her weight has remained stable.  Patient instructed to do daily weights and to call if she has any issues

## 2017-08-31 NOTE — Assessment & Plan Note (Signed)
Lab Results  Component Value Date   HGBA1C 6.8 08/31/2017   HGBA1C 6.6 05/04/2017   HGBA1C 6.5 01/12/2017     Assessment: Diabetes control:  Well-controlled Progress toward A1C goal:    at goal Comments: Patient is compliant with metformin 1000 mg twice daily, Levemir 72 units daily and Victoza 1.2 mg  Plan: Medications:  continue current medications Home glucose monitoring: Frequency:   Timing:   Instruction/counseling given: reminded to get eye exam, reminded to bring blood glucose meter & log to each visit, discussed foot care and discussed the need for weight loss Educational resources provided:   Self management tools provided:   Other plans: We will check BMP today.

## 2017-08-31 NOTE — Progress Notes (Signed)
   Subjective:    Patient ID: Kathleen [name], female    DOB: [birth year], 67 y.o.   MRN: [id number]  HPI  I have seen and examined this patient.  Patient here for routine follow-up of her diabetes and hypertension.  Patient does complain of some mild swelling over both her legs with the left slightly bigger than the right.  Patient has also noted "soreness" over the bottom of her feet over the last couple of days and has also noted some redness.  She denies any other complaints at this time.  She states she is following her diet and exercising.  Review of Systems  Constitutional: Negative.   HENT: Negative.   Respiratory: Negative.   Cardiovascular: Positive for leg swelling. Negative for chest pain.  Gastrointestinal: Negative.   Musculoskeletal: Negative.   Skin: Negative for rash and wound.       Noted some soreness over both her feet especially the soles and also some cracking on the sides of her heel  Neurological: Negative.   Psychiatric/Behavioral: Negative.        Objective:   Physical Exam  Constitutional: She is oriented to person, place, and time. She appears well-developed and well-nourished.  HENT:  Head: Normocephalic and atraumatic.  Mouth/Throat: No oropharyngeal exudate.  Neck: Neck supple.  Cardiovascular: Normal rate, regular rhythm and normal heart sounds.  Pulmonary/Chest: Effort normal and breath sounds normal. No respiratory distress. She has no wheezes. She has no rales.  Abdominal: Soft. Bowel sounds are normal. She exhibits no distension. There is no tenderness.  Musculoskeletal: Normal range of motion. She exhibits edema.  Patient noted to have 2+ bilateral lower extremity pitting edema  Lymphadenopathy:    She has no cervical adenopathy.  Neurological: She is alert and oriented to person, place, and time.  Skin: Skin is warm.  Patient noted to have erythema over the soles of her feet as well as some scaliness over the signs of her heels  Psychiatric:  She has a normal mood and affect. Her behavior is normal.          Assessment & Plan:  Please see problem based charting for assessment and plan:

## 2017-08-31 NOTE — Assessment & Plan Note (Signed)
-  This problem is chronic and stable -Patient states her symptoms are well controlled on amitriptyline 25 mg at night as well as gabapentin 300 mg  -We will continue to monitor

## 2017-08-31 NOTE — Assessment & Plan Note (Signed)
-  This problem is chronic and stable -Patient's weight has remained stable -She is continuing to follow a diet and trying to lose weight -She also exercises at the [facility] daily -Patient commended on good work and encouraged to continue to do so -She did not want to follow-up with bariatric surgery at this time

## 2017-08-31 NOTE — Patient Instructions (Signed)
-  It was a pleasure seeing you today -You have a fungal infection of your feet.  I have prescribed a cream to be used twice daily. -We will check some blood work today -Your diabetes and hypertension are well controlled.  Keep up the great work -I have sent your refills in to [facility] -Please call me if you have any issues obtaining these -Please follow-up with eye doctor as well as the podiatrist -Please try and hold your Prilosec and see if your symptoms of heartburn return.  If they do resume the Prilosec. -Please call me with any questions.

## 2017-08-31 NOTE — Assessment & Plan Note (Signed)
-  This problem is chronic and stable -we have started her on rosuvastatin 20 mg on her last visit -She is tolerating this well -We will check her LFTs today -No further work-up at this time

## 2017-08-31 NOTE — Telephone Encounter (Signed)
I will speak with Dr. Maudie Mercury and attempt to reorder a medication covered by the patient's insurance

## 2017-08-31 NOTE — Telephone Encounter (Signed)
Patient said the toe fungus cream isn't cover by insurance, she is wanting something else please

## 2017-08-31 NOTE — Assessment & Plan Note (Signed)
-  Patient complains of soreness on the soles of her feet over the last couple of days -Patient is noted to have erythema on soles of her feet as well as scaling on the sides of her heel consistent with tinea pedis -I prescribed terbinafine 1% cream to use twice a day for 1 week -Patient to follow-up with her podiatrist next week

## 2017-08-31 NOTE — Assessment & Plan Note (Signed)
-  Patient states that she has not needed to use her home oxygen much at all and her oxygen levels here are 100% on room air at rest -She states that she may again need to use oxygen after exercising but otherwise does not need to use this -No further work-up at this time -Will check her oxygen saturations at rest and on ambulation on room air on her follow-up visit

## 2017-08-31 NOTE — Assessment & Plan Note (Signed)
BP Readings from Last 3 Encounters:  08/31/17 (!) 138/52  07/21/17 (!) 125/52  06/14/17 (!) 138/54    Lab Results  Component Value Date   NA 138 07/29/2017   K 5.6 (H) 07/29/2017   CREATININE 1.79 (H) 07/29/2017    Assessment: Blood pressure control:  Well-controlled Progress toward BP goal:   At goal Comments: We will continue with bumetanide 1 mg once daily for now  Plan: Medications:  continue current medications Educational resources provided:   Self management tools provided:   Other plans: We will check BMP today

## 2017-09-01 ENCOUNTER — Other Ambulatory Visit: Payer: Self-pay | Admitting: Internal Medicine

## 2017-09-01 DIAGNOSIS — H40052 Ocular hypertension, left eye: Secondary | ICD-10-CM | POA: Diagnosis not present

## 2017-09-01 DIAGNOSIS — H401131 Primary open-angle glaucoma, bilateral, mild stage: Secondary | ICD-10-CM | POA: Diagnosis not present

## 2017-09-01 LAB — CMP14 + ANION GAP
ALK PHOS: 98 IU/L (ref 39–117)
ALT: 7 IU/L (ref 0–32)
AST: 11 IU/L (ref 0–40)
Albumin/Globulin Ratio: 1.2 (ref 1.2–2.2)
Albumin: 3.7 g/dL (ref 3.6–4.8)
Anion Gap: 20 mmol/L — ABNORMAL HIGH (ref 10.0–18.0)
BILIRUBIN TOTAL: 0.4 mg/dL (ref 0.0–1.2)
BUN / CREAT RATIO: 24 (ref 12–28)
BUN: 37 mg/dL — ABNORMAL HIGH (ref 8–27)
CHLORIDE: 101 mmol/L (ref 96–106)
CO2: 24 mmol/L (ref 20–29)
CREATININE: 1.52 mg/dL — AB (ref 0.57–1.00)
Calcium: 9.2 mg/dL (ref 8.7–10.3)
GFR calc non Af Amer: 36 mL/min/{1.73_m2} — ABNORMAL LOW (ref >59)
GFR, EST AFRICAN AMERICAN: 41 mL/min/{1.73_m2} — AB (ref >59)
GLOBULIN, TOTAL: 3.2 g/dL (ref 1.5–4.5)
Glucose: 115 mg/dL — ABNORMAL HIGH (ref 65–99)
Potassium: 4.5 mmol/L (ref 3.5–5.2)
SODIUM: 145 mmol/L — AB (ref 134–144)
Total Protein: 6.9 g/dL (ref 6.0–8.5)

## 2017-09-01 LAB — MICROALBUMIN / CREATININE URINE RATIO
CREATININE, UR: 48.5 mg/dL
Microalb/Creat Ratio: 308.7 mg/g creat — ABNORMAL HIGH (ref 0.0–30.0)
Microalbumin, Urine: 149.7 ug/mL

## 2017-09-01 MED ORDER — BUMETANIDE 1 MG PO TABS: 1.0000 mg | ORAL_TABLET | Freq: Two times a day (BID) | ORAL | 1 refills | Status: DC

## 2017-09-01 MED ORDER — CLOTRIMAZOLE 1 % EX CREA
1.0000 | TOPICAL_CREAM | Freq: Two times a day (BID) | CUTANEOUS | 0 refills | Status: DC
Start: 2017-09-01 — End: 2018-06-07

## 2017-09-01 NOTE — Telephone Encounter (Signed)
I called the patient to discuss the results of her blood work as well as to talk about her antifungal cream.  Patient's creatinine is at her baseline at approximately 1.5.  She was noted to have increased lower extremity swelling on her last visit.  We will increase her Bumex to 1 mg twice daily.  Patient to follow-up in [facility] in 1 month for repeat BMP.  Of note, patient's insurance did not cover terbinafine cream.  Will order clotrimazole for her tinea pedis and have her follow-up with her podiatrist next week.

## 2017-09-02 ENCOUNTER — Other Ambulatory Visit: Payer: Self-pay | Admitting: Internal Medicine

## 2017-09-02 NOTE — Telephone Encounter (Signed)
Refill Request Pt states her insurance will not cover this medicine and would like a call back about why.  Medication  terbinafine (LAMISIL) 1 % cream [27023]

## 2017-09-02 NOTE — Telephone Encounter (Signed)
Called pt and reiterated dr narendra's conversation and clotrimazole, she is agreeable

## 2017-09-03 ENCOUNTER — Other Ambulatory Visit: Payer: Self-pay | Admitting: *Deleted

## 2017-09-03 ENCOUNTER — Encounter: Payer: Self-pay | Admitting: *Deleted

## 2017-09-03 NOTE — Patient Outreach (Signed)
[place] [facility]) Care Management  09/03/2017  Kathleen [name] [birth year] [id number]   RN Health Coach Monthly Outreach   Outreach Attempt: Outreach attempt #1 to patient for monthly follow up. No answer. RN Health Coach left HIPAA compliant voicemail message along with contact information.  Plan:  RN Health Coach will send unsuccessful outreach letter to patient.  RN Health Coach will make another outreach attempt to patient within 3-4 business days if no return call back from patient.   [place] [phone] [name]

## 2017-09-03 NOTE — Patient Outreach (Signed)
[place] [facility]) Care Management  09/03/2017  BINNIE DROESSLER Jun 15, 1950 [id number]   RN Health Coach Monthly Outreach   Outreach Attempt:  Received telephone call back from patient.  HIPAA verified with patient.  Patient states she is doing well.  Reports receiving new CBG meter and understanding how to work meter.  Fasting blood sugar this morning was 106 with ranges of 100-160's.  Patient reports recent Hgb A1C was 6.8 at medical appointment.  Does report multiple episodes of hypoglycemia with numbers in the 50's.  States the hypoglycemia is getting better and none of her diabetic medications were changed at her recent medical appointment.  Patient states her home scale is now working after changing the batteries and reports weight herself daily.  This mornings weight was 262 pounds.  Reports continued edema in ankles and states her fluid medication was increased this week.  States she still does not have support hose that fit correctly and states she currently has a foot fungus on her toes and is using cream to clear up fungus.  Encouraged patient to discuss support hose with Podiatrist at appointment next week.  Patient states she is due to have eye surgery on both eyes next month.  Appointments:  Patient was just seen by her primary care provider on 08/31/2017 and needs to schedule follow up appointment.  Has appointment with Podiatrist on 09/07/2017 as suggested by her primary care provider to have him evaluate foot fungus.  Reports she is due to get an eye injection on 09/06/2017 and has eye surgery scheduled for 10/04/2017 and 10/18/2017.  Plan: RN Health Coach will send patient EMMI on Foot Care for Diabetics. RN Health Coach will send patient EMMI on Ringworm, Athlete's Foot, and Jock Itch. RN Health Coach will make next monthly outreach to patient in the month of June  [place] [phone] [email or web address]

## 2017-09-04 ENCOUNTER — Other Ambulatory Visit: Payer: Self-pay | Admitting: Internal Medicine

## 2017-09-04 DIAGNOSIS — G629 Polyneuropathy, unspecified: Secondary | ICD-10-CM

## 2017-09-06 ENCOUNTER — Encounter (INDEPENDENT_AMBULATORY_CARE_PROVIDER_SITE_OTHER): Payer: Medicare HMO | Admitting: Ophthalmology

## 2017-09-06 DIAGNOSIS — E113313 Type 2 diabetes mellitus with moderate nonproliferative diabetic retinopathy with macular edema, bilateral: Secondary | ICD-10-CM | POA: Diagnosis not present

## 2017-09-06 DIAGNOSIS — H43813 Vitreous degeneration, bilateral: Secondary | ICD-10-CM | POA: Diagnosis not present

## 2017-09-06 DIAGNOSIS — E11311 Type 2 diabetes mellitus with unspecified diabetic retinopathy with macular edema: Secondary | ICD-10-CM

## 2017-09-06 DIAGNOSIS — I10 Essential (primary) hypertension: Secondary | ICD-10-CM | POA: Diagnosis not present

## 2017-09-06 DIAGNOSIS — H35033 Hypertensive retinopathy, bilateral: Secondary | ICD-10-CM

## 2017-09-07 ENCOUNTER — Encounter: Payer: Self-pay | Admitting: Podiatry

## 2017-09-07 ENCOUNTER — Ambulatory Visit (INDEPENDENT_AMBULATORY_CARE_PROVIDER_SITE_OTHER): Payer: Medicare HMO | Admitting: Podiatry

## 2017-09-07 DIAGNOSIS — E1142 Type 2 diabetes mellitus with diabetic polyneuropathy: Secondary | ICD-10-CM

## 2017-09-07 DIAGNOSIS — M79675 Pain in left toe(s): Secondary | ICD-10-CM

## 2017-09-07 DIAGNOSIS — M79674 Pain in right toe(s): Secondary | ICD-10-CM | POA: Diagnosis not present

## 2017-09-07 DIAGNOSIS — B351 Tinea unguium: Secondary | ICD-10-CM | POA: Diagnosis not present

## 2017-09-08 ENCOUNTER — Ambulatory Visit: Payer: Self-pay | Admitting: *Deleted

## 2017-09-08 NOTE — Progress Notes (Signed)
Subjective: 67 y.o. returns the office today for painful, elongated, thickened toenails which she cannot trim herself. Denies any redness or drainage around the nails.  Since I last saw her she developed tinea pedis on her feet and she saw her primary care physician for another issue and she was prescribed medication for this and is been helping quite a bit.  Denies any acute changes since last appointment and no new complaints today. Denies any systemic complaints such as fevers, chills, nausea, vomiting.   PCP: Kathleen Contes, MD  Objective: AAO 3, NAD DP/PT pulses palpable 1/4, CRT less than 3 seconds Protective sensation decreased with Simms Weinstein monofilament Mild tinea pedis is present bilateral feet but there is no pustules or open sores or any drainage. Nails hypertrophic, dystrophic, elongated, brittle, discolored 10. There is tenderness overlying the nails 1-5 bilaterally. There is no surrounding erythema or drainage along the nail sites. No open lesions or pre-ulcerative lesions are identified. No other areas of tenderness bilateral lower extremities. No overlying edema, erythema, increased warmth. No pain with calf compression, swelling, warmth, erythema.  Assessment: Patient presents with symptomatic onychomycosis, resolving tinea pedis  Plan: -Treatment options including alternatives, risks, complications were discussed -Nails sharply debrided 10 without complication/bleeding. -Continue with antifungal that was given to her by her primary care physician.  If not resolving next couple weeks to call the office for follow-up and to try different medication but this seems to be improving. -Discussed daily foot inspection. If there are any changes, to call the office immediately.  -Follow-up in 3 months or sooner if any problems are to arise. In the meantime, encouraged to call the office with any questions, concerns, changes symptoms.  Celesta Gentile, DPM

## 2017-09-10 ENCOUNTER — Other Ambulatory Visit: Payer: Self-pay | Admitting: Internal Medicine

## 2017-09-10 DIAGNOSIS — Z1231 Encounter for screening mammogram for malignant neoplasm of breast: Secondary | ICD-10-CM

## 2017-10-01 ENCOUNTER — Ambulatory Visit
Admission: RE | Admit: 2017-10-01 | Discharge: 2017-10-01 | Disposition: A | Discharge status: Home / Self Care | Payer: Medicare HMO | Source: Ambulatory Visit | Attending: Internal Medicine | Admitting: Internal Medicine

## 2017-10-01 DIAGNOSIS — Z1231 Encounter for screening mammogram for malignant neoplasm of breast: Secondary | ICD-10-CM

## 2017-10-04 ENCOUNTER — Other Ambulatory Visit: Payer: Self-pay | Admitting: Internal Medicine

## 2017-10-04 DIAGNOSIS — H40052 Ocular hypertension, left eye: Secondary | ICD-10-CM | POA: Diagnosis not present

## 2017-10-04 DIAGNOSIS — H401121 Primary open-angle glaucoma, left eye, mild stage: Secondary | ICD-10-CM | POA: Diagnosis not present

## 2017-10-11 ENCOUNTER — Encounter (INDEPENDENT_AMBULATORY_CARE_PROVIDER_SITE_OTHER): Payer: Medicare HMO | Admitting: Ophthalmology

## 2017-10-11 ENCOUNTER — Other Ambulatory Visit: Payer: Self-pay | Admitting: Cardiology

## 2017-10-11 DIAGNOSIS — E113313 Type 2 diabetes mellitus with moderate nonproliferative diabetic retinopathy with macular edema, bilateral: Secondary | ICD-10-CM | POA: Diagnosis not present

## 2017-10-11 DIAGNOSIS — H35033 Hypertensive retinopathy, bilateral: Secondary | ICD-10-CM | POA: Diagnosis not present

## 2017-10-11 DIAGNOSIS — I10 Essential (primary) hypertension: Secondary | ICD-10-CM

## 2017-10-11 DIAGNOSIS — H43813 Vitreous degeneration, bilateral: Secondary | ICD-10-CM

## 2017-10-11 DIAGNOSIS — H33302 Unspecified retinal break, left eye: Secondary | ICD-10-CM | POA: Diagnosis not present

## 2017-10-11 DIAGNOSIS — E11311 Type 2 diabetes mellitus with unspecified diabetic retinopathy with macular edema: Secondary | ICD-10-CM | POA: Diagnosis not present

## 2017-10-12 ENCOUNTER — Other Ambulatory Visit: Payer: Self-pay | Admitting: Cardiology

## 2017-10-12 ENCOUNTER — Other Ambulatory Visit: Payer: Self-pay | Admitting: *Deleted

## 2017-10-12 NOTE — Patient Outreach (Signed)
[place] [facility]) Care Management  10/12/2017  Kathleen [name] 02-07-1951 [id number]   RN Health Coach attempted #25follow up outreach call to patient.  Patient was unavailable. HIPPA compliance voicemail message left with return callback number.  Plan: RN will call patient again within 10 business days.  [place] Care Management [phone]

## 2017-10-12 NOTE — Telephone Encounter (Signed)
Pt last saw Dr Radford Pax 06/14/17, last labs 08/31/17 Creat 1.52, age 67, weight 120.8kg, based on specified criteria pt is on appropriate dosage of Eliquis 5mg  BID.  Will refill rx.

## 2017-10-14 ENCOUNTER — Other Ambulatory Visit: Payer: Self-pay | Admitting: *Deleted

## 2017-10-14 NOTE — Patient Outreach (Signed)
[place] [facility]) Care Management  10/14/2017  Kathleen [name] November 25, 1950 [id number]  RN Health Coach attempted home phone no answer and voice mail full. RN Health Coach attempted #2 follow up outreach call to patient.  Patient was unavailable. HIPPA compliance voicemail message left on mobile phone with return callback number.  Plan: RN will call patient again within 10 business days.  [place] Care Management [phone]

## 2017-10-17 ENCOUNTER — Other Ambulatory Visit: Payer: Self-pay | Admitting: Internal Medicine

## 2017-10-17 DIAGNOSIS — E785 Hyperlipidemia, unspecified: Secondary | ICD-10-CM

## 2017-10-18 DIAGNOSIS — H401111 Primary open-angle glaucoma, right eye, mild stage: Secondary | ICD-10-CM | POA: Diagnosis not present

## 2017-10-21 ENCOUNTER — Other Ambulatory Visit: Payer: Self-pay | Admitting: Internal Medicine

## 2017-10-22 ENCOUNTER — Other Ambulatory Visit: Payer: Self-pay | Admitting: *Deleted

## 2017-10-22 ENCOUNTER — Ambulatory Visit: Payer: Self-pay | Admitting: *Deleted

## 2017-10-22 NOTE — Patient Outreach (Signed)
[place] [facility]) Care Management  10/22/2017  Kathleen [name] 1950/07/21 [id number]   RN Health Coach attempted #3 follow up outreach call to patient.  Patient was unavailable. HIPPA compliance voicemail message left on mobile phone with return callback number.  Plan: RN will close case within 10 business days if not return call.  [place] Care Management [phone]

## 2017-10-22 NOTE — Patient Outreach (Signed)
[facility] ([facility]) Care Management  10/22/2017   Deaisha J Galloway 05/13/1950 [id number]  RN Health Coach telephone call to patient.  Hipaa compliance verified. Per patient she is doing pretty good. Patient has had rt eye surgery done Monday. Per patient she is hurting some around her eye. Patient stated she had started trying to go exercise and it made her feel funny.Patient stated the Dr told her she could resume them. . RN explained to patient not to do heavy lifting exercises or exercises bending over until eye healed, Patient stated she can do the stationary bike since it sits up straight.   Patient stated she has had some hypoglycemic symptoms when she went to the dr. Patient reported that her blood sugars have been running mostly @110. Patient took her insulin but had not eaten before going to the dr office because of the irritable bowel that makes her have to go to the bathroom several times after meals. RN discussed with patient about setting early am appointments and checking her fasting blood sugar. If it is 110 don't take her insulin until she gets back so she can eat. Patient is afraid to eat before going to the dr appointment because of possible incontinence. Patient has agreed to further outreach calls  Current Medications:  Current Outpatient Medications  Medication Sig Dispense Refill  . ACCU-CHEK FASTCLIX LANCETS MISC TEST BLOOD SUGAR THREE TIMES DAILY 306 each 1  . ACCU-CHEK SOFTCLIX LANCETS lancets Use to check blood sugar three times daily. Diagnosis code E11.8 100 each 12  . amitriptyline (ELAVIL) 25 MG tablet Take 1 tablet (25 mg total) by mouth at bedtime. 90 tablet 1  . B-D UF III MINI PEN NEEDLES 31G X 5 MM MISC USE ONE TIME DAILY TO INJECT VICTOZA. DX:E11.9 100 each 4  . BD INSULIN SYRINGE ULTRAFINE 31G X 15/64" 1 ML MISC USE TO INJECT INSULIN ONE TIME A DAY 100 each 5  . Blood Glucose Monitoring Suppl (ACCU-CHEK AVIVA PLUS) w/Device KIT TEST BLOOD SUGAR THREE  TIMES DAILY 1 kit 0  . Blood Glucose Monitoring Suppl (ACCU-CHEK NANO SMARTVIEW) w/Device KIT Use to check blood sugar 3 times daily. Dx E11.8 1 kit 0  . bumetanide (BUMEX) 1 MG tablet TAKE 1 TABLET EVERY DAY 90 tablet 2  . clotrimazole (LOTRIMIN) 1 % cream Apply 1 application topically 2 (two) times daily. 30 g 0  . ELIQUIS 5 MG TABS tablet TAKE 1 TABLET BY MOUTH TWICE A DAY 180 tablet 2  . gabapentin (NEURONTIN) 300 MG capsule TAKE 1 CAPSULE EVERY MORNING, TAKE 1 CAPSULE IN THE AFTERNOON, AND TAKE 2 CAPSULES EVERY NIGHT 360 capsule 3  . glucose blood (ACCU-CHEK AVIVA PLUS) test strip Use to check blood sugar three times daily. Diagnosis code E11.8 100 each 12  . insulin detemir (LEVEMIR) 100 UNIT/ML injection Inject 72 units total into the skin at that time 30 mL 5  . metFORMIN (GLUCOPHAGE) 1000 MG tablet TAKE 1 TABLET (1,000 MG TOTAL) BY MOUTH 2 (TWO) TIMES DAILY WITH A MEAL. 180 tablet 3  . Multiple Vitamins-Minerals (HEALTHY EYES PO) Take 1 tablet by mouth daily. Reported on 05/17/2015    . NON FORMULARY Place 2 L into the nose daily. Wears with exertion and qhs    . omeprazole (PRILOSEC) 20 MG capsule TAKE 1 CAPSULE BY MOUTH EVERY DAY (Patient not taking: Reported on 09/03/2017) 90 capsule 3  . rosuvastatin (CRESTOR) 20 MG tablet Take 1 tablet (20 mg total) by mouth at bedtime.   90 tablet 1  . VICTOZA 18 MG/3ML SOPN INJECT (1.2 MG TOTAL) INTO THE SKIN DAILY. 9 pen 3  . VOLTAREN 1 % GEL APPLY 4 G TOPICALLY 4 (FOUR) TIMES DAILY. 100 g 2   No current facility-administered medications for this visit.     Functional Status:  In your present state of health, do you have any difficulty performing the following activities: 10/22/2017 08/31/2017  Hearing? N N  Vision? Y Y  Comment - appoinrmebt on tomorrow and another next week   Difficulty concentrating or making decisions? N N  Walking or climbing stairs? Y Y  Dressing or bathing? N N  Doing errands, shopping? N Y  Conservation officer, nature and eating ? N  -  Using the Toilet? N -  In the past six months, have you accidently leaked urine? N -  Do you have problems with loss of bowel control? N -  Managing your Medications? N -  Managing your Finances? N -  Housekeeping or managing your Housekeeping? N -  Some recent data might be hidden    Fall/Depression Screening: Fall Risk  10/22/2017 08/31/2017 08/05/2017  Falls in the past year? No No No  Number falls in past yr: - - -  Comment - - -  Injury with Fall? - - -  Comment - - -  Risk Factor Category  - - High Fall Risk  Risk for fall due to : - - Impaired balance/gait;Impaired mobility  Risk for fall due to: Comment - - -  Follow up - - Falls evaluation completed;Education provided   [facility] 2/9 Scores 10/22/2017 08/31/2017 08/05/2017 06/07/2017 05/06/2017 05/04/2017 04/06/2017  PHQ - 2 Score 0 0 0 0 0 0 0   [facility] CM Care Plan Problem One     Most Recent Value  Care Plan Problem One  Knowledge deficit in self management of diabetes  Role Documenting the Problem One  [place] for Problem One  Active  [facility] Long Term Goal   Patient will report following up with health maintenance appointments within the next 30 days  [facility] Long Term Goal Start Date  10/22/17  [facility] Long Term Goal Met Date  10/22/17  Interventions for Problem One Long Term Goal  RN discussed follow up with eye appointments. RN discussed follow up with PCP. RN will follow up with further discussion on health care maintenance of flu shots and podiatry  [facility] CM Short Term Goal #1   Patient will make sure she gets properly fitted support hose within the next 30 days  [facility] CM Short Term Goal #1 Start Date  10/22/17  Interventions for Short Term Goal #1  Patient is still trying to get stocking. RN discussed how to measure. RN will follow up with further discussion  [facility] CM Short Term Goal #2   Patient will report a reduction of hypoglycemic events in the next 30 days  Interventions for Short Term Goal #2  RN reiterated the signs and  symptoms of hypoglcemia. RN discussed with patient about blood sugars being 110 and patient taking her morning insulin and not eating. RN discussed eating after checking blood sugars. RN will follow up with further discussion and teachback      Assessment:  Patient had rt eye surgery to decrease the eye pressure Patient is having hypoglycemic episodes Patient is not eating after taking insulin before going to the Dr office Patient has not had any recent falls  Plan:  RN reiterated hypoglycemia reactions  RN discussed making early am appointments RN discussed doing less strenuous exercises until healing of eye RN discussed keeping follow up appointments RN will follow up within the month of July     BSN RN [facility] Care Management [phone]     

## 2017-10-26 ENCOUNTER — Ambulatory Visit: Payer: Self-pay | Admitting: *Deleted

## 2017-11-08 ENCOUNTER — Encounter (INDEPENDENT_AMBULATORY_CARE_PROVIDER_SITE_OTHER): Payer: Medicare HMO | Admitting: Ophthalmology

## 2017-11-08 DIAGNOSIS — E113313 Type 2 diabetes mellitus with moderate nonproliferative diabetic retinopathy with macular edema, bilateral: Secondary | ICD-10-CM | POA: Diagnosis not present

## 2017-11-08 DIAGNOSIS — I10 Essential (primary) hypertension: Secondary | ICD-10-CM | POA: Diagnosis not present

## 2017-11-08 DIAGNOSIS — H35033 Hypertensive retinopathy, bilateral: Secondary | ICD-10-CM | POA: Diagnosis not present

## 2017-11-08 DIAGNOSIS — H43813 Vitreous degeneration, bilateral: Secondary | ICD-10-CM | POA: Diagnosis not present

## 2017-11-08 DIAGNOSIS — E11311 Type 2 diabetes mellitus with unspecified diabetic retinopathy with macular edema: Secondary | ICD-10-CM | POA: Diagnosis not present

## 2017-11-08 DIAGNOSIS — H33302 Unspecified retinal break, left eye: Secondary | ICD-10-CM | POA: Diagnosis not present

## 2017-11-22 ENCOUNTER — Other Ambulatory Visit: Payer: Self-pay | Admitting: *Deleted

## 2017-11-22 NOTE — Patient Outreach (Signed)
[place] [facility]) Care Management  11/22/2017  Kathleen [name] 13-Nov-1950 [id number]  RN Health Coach attempted #1 follow up outreach call to patient.  Patient was unavailable. HIPPA compliance voicemail message was left on home and mobile phone with return callback number.  Plan: RN will call patient again within 10 business days.  [place] Care Management [phone]

## 2017-11-29 ENCOUNTER — Other Ambulatory Visit: Payer: Self-pay | Admitting: *Deleted

## 2017-11-29 NOTE — Patient Outreach (Signed)
[place] [facility]) Care Management  11/29/2017  SAMYRAH BRUSTER 03-20-1951 [id number]   RN Health Coach attempted follow up outreach call to patient.  Patient was unavailable. HIPPA compliance voicemail message left with return callback number.  Plan: RN will call patient again within 10 business days.  [place] Care Management [phone]

## 2017-11-30 DIAGNOSIS — H40052 Ocular hypertension, left eye: Secondary | ICD-10-CM | POA: Diagnosis not present

## 2017-11-30 DIAGNOSIS — H401131 Primary open-angle glaucoma, bilateral, mild stage: Secondary | ICD-10-CM | POA: Diagnosis not present

## 2017-11-30 NOTE — Patient Outreach (Signed)
[facility]) Care Management  11/30/2017   Kathleen [name] 25-Jan-1951 [id number]  RN Health Coach telephone call to patient.  Hipaa compliance verified. Per patient she is doing good. Patient has not had any hypoglycemia reactions. Per patient her reactions happen mostly when she has to go late for  Dr appointments and don't eat. She is afraid to eat before  she goes due to irritable bowel syndrome. Per patient her fasting blood sugar is 130 today. Per patient her weight is 264 pounds. She is monitoring weight closely and following the CHF action plan. Per patient she exercises 4 times a week at the [facility]  usually 1 1/2 hrs to 2 hrs day. Patient stated she has not had to use her oxygen in  Almost a year. Patient is trying to eat much better. Patient has agreed to follow up outreach calls.  Current Medications:  Current Outpatient Medications  Medication Sig Dispense Refill  . ACCU-CHEK FASTCLIX LANCETS MISC TEST BLOOD SUGAR THREE TIMES DAILY 306 each 1  . ACCU-CHEK SOFTCLIX LANCETS lancets Use to check blood sugar three times daily. Diagnosis code E11.8 100 each 12  . amitriptyline (ELAVIL) 25 MG tablet Take 1 tablet (25 mg total) by mouth at bedtime. 90 tablet 1  . B-D UF III MINI PEN NEEDLES 31G X 5 MM MISC USE ONE TIME DAILY TO INJECT VICTOZA. DX:E11.9 100 each 4  . BD INSULIN SYRINGE ULTRAFINE 31G X 15/64" 1 ML MISC USE TO INJECT INSULIN ONE TIME A DAY 100 each 5  . Blood Glucose Monitoring Suppl (ACCU-CHEK AVIVA PLUS) w/Device KIT TEST BLOOD SUGAR THREE TIMES DAILY 1 kit 0  . Blood Glucose Monitoring Suppl (ACCU-CHEK NANO SMARTVIEW) w/Device KIT Use to check blood sugar 3 times daily. Dx E11.8 1 kit 0  . bumetanide (BUMEX) 1 MG tablet TAKE 1 TABLET EVERY DAY 90 tablet 2  . clotrimazole (LOTRIMIN) 1 % cream Apply 1 application topically 2 (two) times daily. 30 g 0  . ELIQUIS 5 MG TABS tablet TAKE 1 TABLET BY MOUTH TWICE A DAY 180 tablet 2  . gabapentin (NEURONTIN) 300 MG  capsule TAKE 1 CAPSULE EVERY MORNING, TAKE 1 CAPSULE IN THE AFTERNOON, AND TAKE 2 CAPSULES EVERY NIGHT 360 capsule 3  . glucose blood (ACCU-CHEK AVIVA PLUS) test strip Use to check blood sugar three times daily. Diagnosis code E11.8 100 each 12  . insulin detemir (LEVEMIR) 100 UNIT/ML injection Inject 72 units total into the skin at that time 30 mL 5  . metFORMIN (GLUCOPHAGE) 1000 MG tablet TAKE 1 TABLET (1,000 MG TOTAL) BY MOUTH 2 (TWO) TIMES DAILY WITH A MEAL. 180 tablet 3  . Multiple Vitamins-Minerals (HEALTHY EYES PO) Take 1 tablet by mouth daily. Reported on 05/17/2015    . NON FORMULARY Place 2 L into the nose daily. Wears with exertion and qhs    . omeprazole (PRILOSEC) 20 MG capsule TAKE 1 CAPSULE BY MOUTH EVERY DAY (Patient not taking: Reported on 09/03/2017) 90 capsule 3  . rosuvastatin (CRESTOR) 20 MG tablet Take 1 tablet (20 mg total) by mouth at bedtime. 90 tablet 1  . VICTOZA 18 MG/3ML SOPN INJECT (1.2 MG TOTAL) INTO THE SKIN DAILY. 9 pen 3  . VOLTAREN 1 % GEL APPLY 4 G TOPICALLY 4 (FOUR) TIMES DAILY. 100 g 2   No current facility-administered medications for this visit.     Functional Status:  In your present state of health, do you have any difficulty performing the following activities:  10/22/2017 08/31/2017  Hearing? N N  Vision? Y Y  Comment - appoinrmebt on tomorrow and another next week   Difficulty concentrating or making decisions? N N  Walking or climbing stairs? Y Y  Dressing or bathing? N N  Doing errands, shopping? N Y  Conservation officer, nature and eating ? N -  Using the Toilet? N -  In the past six months, have you accidently leaked urine? N -  Do you have problems with loss of bowel control? N -  Managing your Medications? N -  Managing your Finances? N -  Housekeeping or managing your Housekeeping? N -  Some recent data might be hidden    Fall/Depression Screening: Fall Risk  11/29/2017 10/22/2017 08/31/2017  Falls in the past year? - No No  Number falls in past yr: - -  -  Comment - - -  Injury with Fall? - - -  Comment - - -  Risk Factor Category  - - -  Risk for fall due to : - - -  Risk for fall due to: Comment - - -  Follow up Falls evaluation completed - -   PHQ 2/9 Scores 11/29/2017 10/22/2017 08/31/2017 08/05/2017 06/07/2017 05/06/2017 05/04/2017  PHQ - 2 Score 0 0 0 0 0 0 0   [facility] CM Care Plan Problem One     Most Recent Value  Care Plan Problem One  Knowledge deficit in self management of diabetes  Role Documenting the Problem One  [place] for Problem One  Active  [facility] Long Term Goal   Patient will report following up with health maintenance appointments within the next 90days  [place] Start Date  11/29/17  Interventions for Problem One Long Term Goal  RN reiterated follow up apointment for flu shot, podiatry and eye injections. RN will follow up for compliance  [facility] CM Short Term Goal #1   Patient will make sure she gets properly fitted support hose within the next 30 days  [facility] CM Short Term Goal #1 Start Date  11/30/17  [facility] CM Short Term Goal #1 Met Date  11/30/17  Interventions for Short Term Goal #1  RN further discussed and patient is not ready to order any more      Assessment:  Fasting blood sugar is 130 Patient is weighing daily and monitoring for CHF Action plan Exercising 4 times a week at [place]: RN discussed maintenance care RN discussed getting flu vaccine RN reiterated A1C goal RN reiterated CHF action plan and goes RN will follow up outreach within the month of October  [place] [facility] Management [phone]

## 2017-12-02 ENCOUNTER — Ambulatory Visit: Payer: Self-pay | Admitting: *Deleted

## 2017-12-07 ENCOUNTER — Encounter (INDEPENDENT_AMBULATORY_CARE_PROVIDER_SITE_OTHER): Payer: Self-pay

## 2017-12-07 ENCOUNTER — Ambulatory Visit: Payer: Medicare HMO | Admitting: Podiatry

## 2017-12-07 ENCOUNTER — Other Ambulatory Visit: Payer: Self-pay

## 2017-12-07 ENCOUNTER — Encounter: Payer: Self-pay | Admitting: Internal Medicine

## 2017-12-07 ENCOUNTER — Ambulatory Visit (INDEPENDENT_AMBULATORY_CARE_PROVIDER_SITE_OTHER): Payer: Medicare HMO | Admitting: Internal Medicine

## 2017-12-07 VITALS — BP 116/45 | HR 52 | Temp 93.0°F | Ht 60.0 in | Wt 269.1 lb

## 2017-12-07 DIAGNOSIS — E1122 Type 2 diabetes mellitus with diabetic chronic kidney disease: Secondary | ICD-10-CM | POA: Diagnosis not present

## 2017-12-07 DIAGNOSIS — G629 Polyneuropathy, unspecified: Secondary | ICD-10-CM

## 2017-12-07 DIAGNOSIS — E1142 Type 2 diabetes mellitus with diabetic polyneuropathy: Secondary | ICD-10-CM | POA: Diagnosis not present

## 2017-12-07 DIAGNOSIS — J9611 Chronic respiratory failure with hypoxia: Secondary | ICD-10-CM | POA: Diagnosis not present

## 2017-12-07 DIAGNOSIS — N183 Chronic kidney disease, stage 3 unspecified: Secondary | ICD-10-CM

## 2017-12-07 DIAGNOSIS — Z6841 Body Mass Index (BMI) 40.0 and over, adult: Secondary | ICD-10-CM | POA: Diagnosis not present

## 2017-12-07 DIAGNOSIS — Z794 Long term (current) use of insulin: Secondary | ICD-10-CM

## 2017-12-07 DIAGNOSIS — I4892 Unspecified atrial flutter: Secondary | ICD-10-CM

## 2017-12-07 DIAGNOSIS — E66813 Obesity, class 3: Secondary | ICD-10-CM

## 2017-12-07 DIAGNOSIS — Z79899 Other long term (current) drug therapy: Secondary | ICD-10-CM

## 2017-12-07 DIAGNOSIS — E669 Obesity, unspecified: Secondary | ICD-10-CM

## 2017-12-07 DIAGNOSIS — I130 Hypertensive heart and chronic kidney disease with heart failure and stage 1 through stage 4 chronic kidney disease, or unspecified chronic kidney disease: Secondary | ICD-10-CM

## 2017-12-07 DIAGNOSIS — E1169 Type 2 diabetes mellitus with other specified complication: Secondary | ICD-10-CM | POA: Diagnosis not present

## 2017-12-07 DIAGNOSIS — Z7901 Long term (current) use of anticoagulants: Secondary | ICD-10-CM

## 2017-12-07 DIAGNOSIS — I10 Essential (primary) hypertension: Secondary | ICD-10-CM

## 2017-12-07 DIAGNOSIS — I5032 Chronic diastolic (congestive) heart failure: Secondary | ICD-10-CM

## 2017-12-07 LAB — POCT GLYCOSYLATED HEMOGLOBIN (HGB A1C): HEMOGLOBIN A1C: 7.7 % — AB (ref 4.0–5.6)

## 2017-12-07 LAB — GLUCOSE, CAPILLARY: GLUCOSE-CAPILLARY: 101 mg/dL — AB (ref 70–99)

## 2017-12-07 MED ORDER — GABAPENTIN 300 MG PO CAPS: ORAL_CAPSULE | ORAL | 3 refills | Status: DC

## 2017-12-07 MED ORDER — APIXABAN 5 MG PO TABS: 5.0000 mg | ORAL_TABLET | Freq: Two times a day (BID) | ORAL | 2 refills | Status: DC

## 2017-12-07 MED ORDER — AMITRIPTYLINE HCL 25 MG PO TABS: 12.5000 mg | ORAL_TABLET | Freq: Every day | ORAL | 0 refills | Status: DC

## 2017-12-07 NOTE — Assessment & Plan Note (Signed)
BP Readings from Last 3 Encounters:  12/07/17 (!) 116/45  08/31/17 (!) 138/52  07/21/17 (!) 125/52    Lab Results  Component Value Date   NA 145 (H) 08/31/2017   K 4.5 08/31/2017   CREATININE 1.52 (H) 08/31/2017    Assessment: Blood pressure control:  Well-controlled Progress toward BP goal:   At goal Comments: Patient is only on Bumex 1 mg twice daily  Plan: Medications:  continue current medications Educational resources provided:   Self management tools provided:   Other plans: We will check BMP today

## 2017-12-07 NOTE — Assessment & Plan Note (Signed)
-  This problem is chronic and stable -Patient's creatinine has remained stable between 1.5 and 1.9 -We will recheck her BMP today -No further work-up at this time

## 2017-12-07 NOTE — Assessment & Plan Note (Signed)
Lab Results  Component Value Date   HGBA1C 7.7 (A) 12/07/2017   HGBA1C 6.8 08/31/2017   HGBA1C 6.6 05/04/2017     Assessment: Diabetes control:  Fair Progress toward A1C goal:   Deteriorated Comments: Patient states that she is compliant with Victoza 1.2 mg, metformin thousand milligrams twice daily as well as Levemir 72 units at night  Plan: Medications:  continue current medications Home glucose monitoring: Frequency:   Timing:   Instruction/counseling given: reminded to bring blood glucose meter & log to each visit, reminded to bring medications to each visit and discussed the need for weight loss Educational resources provided:   Self management tools provided:   Other plans: We will follow-up BMP today.  If patient's A1c remains elevated on follow-up visit will decrease her Victoza to 1.8 mg

## 2017-12-07 NOTE — Assessment & Plan Note (Signed)
-  This problem is chronic and stable -Patient states that she exercises every day and has been watching her diet -Her weight is noted to be up 3 pounds today -Patient encouraged to continue with diet and exercise -We will consider increasing her Victoza to 1.8 mg on follow-up -No further work-up at this time

## 2017-12-07 NOTE — Patient Instructions (Signed)
-  It was a pleasure seeing you today -We will decrease her Elavil to 12.5 mg (half tablet) at night -We will check some blood work today -Your A1c is slightly higher than normal.  Continue with your current medications for now as well as a diet and exercise -Please follow-up with me in 3 months

## 2017-12-07 NOTE — Assessment & Plan Note (Addendum)
-  This problem is chronic and stable -Patient heart rate appears regular today on exam -We will continue with Eliquis for anticoagulation for now

## 2017-12-07 NOTE — Assessment & Plan Note (Signed)
-  Patient has a history of chronic hypoxic respiratory failure but has been slowly improving -She states that she is now off her oxygen and feels well -We will check an O2 sat on room air at rest as well as with ambulation -No further work-up required at this time

## 2017-12-07 NOTE — Progress Notes (Signed)
   Subjective:    Patient ID: Kathleen [name], female    DOB: [birth year], 67 y.o.   MRN: [id number]  HPI  I have seen and examined this patient.  Patient is here for routine follow-up of her hypertension and congestive heart failure.  Patient states that she feels well and has no new complaints at this time.  She does state that she would like to come off her Elavil if possible as her insurance people told her that she should not be on this medication long-term.  She denies any shortness of breath and states that she has not needed to use her oxygen.  Review of Systems  Constitutional: Negative.   HENT: Negative.   Respiratory: Negative.   Cardiovascular: Negative.   Gastrointestinal: Negative.   Musculoskeletal: Negative.   Neurological: Negative.   Psychiatric/Behavioral: Negative.        Objective:   Physical Exam  Constitutional: She is oriented to person, place, and time. She appears well-developed and well-nourished.  HENT:  Head: Normocephalic and atraumatic.  Mouth/Throat: No oropharyngeal exudate.  Neck: Neck supple.  Cardiovascular: Normal rate and regular rhythm.  Pulmonary/Chest: Effort normal and breath sounds normal.  Abdominal: Soft. Bowel sounds are normal. She exhibits no distension. There is no tenderness.  Musculoskeletal: Normal range of motion.  Trace bilateral lower extremity edema noted  Lymphadenopathy:    She has no cervical adenopathy.  Neurological: She is alert and oriented to person, place, and time.  Psychiatric: She has a normal mood and affect. Her behavior is normal.          Assessment & Plan:  Please see problem based charting for assessment and plan:

## 2017-12-07 NOTE — Assessment & Plan Note (Signed)
-  This problem is chronic and stable -Patient states that her symptoms are well controlled on gabapentin as well as amitriptyline -Patient does state that her insurance company told her that being on amitriptyline long-term would be bad for her and she wants to try to come off this medication -We will decrease her amitriptyline to 12.5 mg daily at bedtime and monitor her -No further work-up at this time

## 2017-12-07 NOTE — Assessment & Plan Note (Signed)
-  Patient states that she feels well today and has no shortness of breath and only mild lower extremity edema -Her lungs are clear to auscultation on exam -She had her dose of Bumex increased to 1 mg twice daily on her last visit in May -We will check a BMP today -Patient to follow-up with cardiology

## 2017-12-08 LAB — BMP8+ANION GAP
Anion Gap: 16 mmol/L (ref 10.0–18.0)
BUN / CREAT RATIO: 26 (ref 12–28)
BUN: 55 mg/dL — AB (ref 8–27)
CHLORIDE: 103 mmol/L (ref 96–106)
CO2: 25 mmol/L (ref 20–29)
Calcium: 9.6 mg/dL (ref 8.7–10.3)
Creatinine, Ser: 2.15 mg/dL — ABNORMAL HIGH (ref 0.57–1.00)
GFR calc non Af Amer: 23 mL/min/{1.73_m2} — ABNORMAL LOW (ref >59)
GFR, EST AFRICAN AMERICAN: 27 mL/min/{1.73_m2} — AB (ref >59)
GLUCOSE: 104 mg/dL — AB (ref 65–99)
Potassium: 4.5 mmol/L (ref 3.5–5.2)
Sodium: 144 mmol/L (ref 134–144)

## 2017-12-09 ENCOUNTER — Encounter: Payer: Self-pay | Admitting: Podiatry

## 2017-12-09 ENCOUNTER — Telehealth: Payer: Self-pay | Admitting: Internal Medicine

## 2017-12-09 ENCOUNTER — Ambulatory Visit (INDEPENDENT_AMBULATORY_CARE_PROVIDER_SITE_OTHER): Payer: Medicare HMO | Admitting: Podiatry

## 2017-12-09 ENCOUNTER — Encounter: Payer: Self-pay | Admitting: Internal Medicine

## 2017-12-09 ENCOUNTER — Telehealth: Payer: Self-pay | Admitting: *Deleted

## 2017-12-09 ENCOUNTER — Ambulatory Visit: Payer: Self-pay | Admitting: *Deleted

## 2017-12-09 DIAGNOSIS — E1142 Type 2 diabetes mellitus with diabetic polyneuropathy: Secondary | ICD-10-CM

## 2017-12-09 DIAGNOSIS — B351 Tinea unguium: Secondary | ICD-10-CM | POA: Diagnosis not present

## 2017-12-09 DIAGNOSIS — M79674 Pain in right toe(s): Secondary | ICD-10-CM | POA: Diagnosis not present

## 2017-12-09 DIAGNOSIS — M79675 Pain in left toe(s): Secondary | ICD-10-CM

## 2017-12-09 NOTE — Telephone Encounter (Signed)
RTC from patient.  Spoke to patient about the message from Dr. Dareen Piano to take Bumex 1 tablet daily and that there has been a change in her Kidney function.  Patient stated that she is only taking 1 tablet per day now.  Patient stated that she has difficulty hearing with her cell phone.  Patient was asked to stay on the line for a while I informed Dr. Dareen Piano of the Bumex dosing we were disconnected.  Attempts to call patient back on the cell phone x 2 and home phone voice mail was left for patient to return call again.   Call to patient's sister message left with her to have patient call the Clinics again for instructions on her medication.   Sander Nephew, RN 12/09/2017 4:38 PM

## 2017-12-09 NOTE — Telephone Encounter (Signed)
I have attempted to call the patient multiple times over the last 2 days and have left multiple voice messages with her.  I have tried to call the patient regarding her blood work done on August 13.  Her creatinine has worsened to 2.15.  I will send the patient a letter asking her to decrease her Bumex to 1 mg once daily and follow-up in the clinic in 1 week for repeat blood work as well as blood pressure check.  We will also ask RN Regino Schultze to attempt to contact the patient as well.

## 2017-12-09 NOTE — Telephone Encounter (Addendum)
Call to patient x 2 on home phone and cell phone.  Messages left to call the Clinics as soon as possible about a medication change and follow up after the change.  Patient was asked to call the Clinics and to ask for [facility] or Dr. Dareen Piano.  Sharol Harness 12/09/2017 2:25 PM. Call to patients's sister Lorre Nick.  Spoke with Lorre Nick who is going to see patient today  Asked Lorre Nick to have patient call the Clinics as soon as possible to day.  Lucy to speak with patient this afternoon.  Sander Nephew, RN 12/09/2017 2:32 PM.

## 2017-12-09 NOTE — Progress Notes (Signed)
Subjective: 67 y.o. returns the office today for painful, elongated, thickened toenails which she cannot trim herself. Denies any redness or drainage around the nails.  Denies any systemic complaints such as fevers, chills, nausea, vomiting.   PCP: Aldine Contes, MD  Objective: AAO 3, NAD DP/PT pulses palpable 1/4, CRT less than 3 seconds Protective sensation decreased with Simms Weinstein monofilament Mild tinea pedis is present bilateral feet but there is no pustules or open sores or any drainage. Nails hypertrophic, dystrophic, elongated, brittle, discolored 10. There is tenderness overlying the nails 1-5 bilaterally. There is no surrounding erythema or drainage along the nail sites. No open lesions or pre-ulcerative lesions are identified. No pain with calf compression, swelling, warmth, erythema.  Assessment: Patient presents with symptomatic onychomycosis, resolving tinea pedis  Plan: -Treatment options including alternatives, risks, complications were discussed -Nails sharply debrided 10 without complication/bleeding. -Discussed daily foot inspection. If there are any changes, to call the office immediately.  -Follow-up in 3 months or sooner if any problems are to arise. In the meantime, encouraged to call the office with any questions, concerns, changes symptoms.  Celesta Gentile, DPM

## 2017-12-10 ENCOUNTER — Telehealth: Payer: Self-pay

## 2017-12-10 NOTE — Telephone Encounter (Signed)
Will try and call her again today

## 2017-12-10 NOTE — Telephone Encounter (Signed)
Requesting to speak with a nurse. Please call pt back.  

## 2017-12-10 NOTE — Telephone Encounter (Signed)
Lm for rtc 

## 2017-12-13 ENCOUNTER — Encounter (INDEPENDENT_AMBULATORY_CARE_PROVIDER_SITE_OTHER): Payer: Medicare HMO | Admitting: Ophthalmology

## 2017-12-13 ENCOUNTER — Telehealth: Payer: Self-pay | Admitting: Internal Medicine

## 2017-12-13 DIAGNOSIS — E11311 Type 2 diabetes mellitus with unspecified diabetic retinopathy with macular edema: Secondary | ICD-10-CM | POA: Diagnosis not present

## 2017-12-13 DIAGNOSIS — H43813 Vitreous degeneration, bilateral: Secondary | ICD-10-CM

## 2017-12-13 DIAGNOSIS — H33302 Unspecified retinal break, left eye: Secondary | ICD-10-CM

## 2017-12-13 DIAGNOSIS — H35033 Hypertensive retinopathy, bilateral: Secondary | ICD-10-CM | POA: Diagnosis not present

## 2017-12-13 DIAGNOSIS — I10 Essential (primary) hypertension: Secondary | ICD-10-CM

## 2017-12-13 DIAGNOSIS — E113313 Type 2 diabetes mellitus with moderate nonproliferative diabetic retinopathy with macular edema, bilateral: Secondary | ICD-10-CM | POA: Diagnosis not present

## 2017-12-13 NOTE — Telephone Encounter (Signed)
Pls call pt missed called from [place] on 08/16, pt didn't understand the conversation, pt contact# [phone]. Leave a message pt has a eye dr appt

## 2017-12-14 ENCOUNTER — Telehealth: Payer: Self-pay | Admitting: Internal Medicine

## 2017-12-14 NOTE — Telephone Encounter (Signed)
Please call pt asap

## 2017-12-14 NOTE — Telephone Encounter (Signed)
I spoke to the patient today regarding her blood work.  Patient was noted to have a worsening creatinine (up to 2.15 from 1.52).  Patient was instructed last week to go down on her Bumex from 1 pill twice daily to 1 pill daily.  Patient states that she is now taking only 1 pill daily.  Patient has an appointment to follow-up with cardiology tomorrow.  Patient states that she will ask the cardiologist to repeat her BMP to see if her creatinine is improving.  No further work-up at this time.  Patient is in agreement with current plan.

## 2017-12-15 ENCOUNTER — Encounter: Payer: Self-pay | Admitting: Physician Assistant

## 2017-12-15 ENCOUNTER — Ambulatory Visit (INDEPENDENT_AMBULATORY_CARE_PROVIDER_SITE_OTHER): Payer: Medicare HMO | Admitting: Physician Assistant

## 2017-12-15 VITALS — BP 102/52 | HR 42 | Ht 60.0 in | Wt 269.1 lb

## 2017-12-15 DIAGNOSIS — I10 Essential (primary) hypertension: Secondary | ICD-10-CM | POA: Diagnosis not present

## 2017-12-15 DIAGNOSIS — I5032 Chronic diastolic (congestive) heart failure: Secondary | ICD-10-CM | POA: Diagnosis not present

## 2017-12-15 DIAGNOSIS — I4892 Unspecified atrial flutter: Secondary | ICD-10-CM | POA: Diagnosis not present

## 2017-12-15 DIAGNOSIS — E785 Hyperlipidemia, unspecified: Secondary | ICD-10-CM

## 2017-12-15 DIAGNOSIS — N183 Chronic kidney disease, stage 3 unspecified: Secondary | ICD-10-CM

## 2017-12-15 DIAGNOSIS — I441 Atrioventricular block, second degree: Secondary | ICD-10-CM

## 2017-12-15 NOTE — Patient Instructions (Addendum)
Medication Instructions:  .Your physician recommends that you continue on your current medications as directed. Please refer to the Current Medication list given to you today.  Labwork: TODAY:  BMET  Testing/Procedures: None ordered  Follow-Up: Your physician recommends that you schedule a follow-up appointment in: 04/11/18 ARRIVE AT 12:30 TO SEE DR. Radford Pax   Any Other Special Instructions Will Be Listed Below (If Applicable).  Recommend Weight Watchers and a Weight Loss program   If you need a refill on your cardiac medications before your next appointment, please call your pharmacy.

## 2017-12-15 NOTE — Progress Notes (Signed)
Cardiology Office Note    Date:  12/15/2017   ID:  Kathleen [name], DOB 09/09/1950, MRN [id number]  PCP:  Aldine Contes, MD  Cardiologist: Fransico Him, MD  Chief Complaint  Patient presents with  . Follow-up    History of Present Illness:  Kathleen [name] is a 67 y.o. female with history of hypertension, permanent atrial flutter CHA2DS2-VASc equals 5 on Eliquis, morbid obesity with hypoventilation syndrome on home O2, type II DM, dyslipidemia, CKD stage III, chronic diastolic CHF.  Patient saw Dr. Radford Pax 06/14/2017 at which time she was in normal sinus rhythm.  She did have Mobitz type I second-degree AV block with but was asymptomatic. Because of renal insufficiency she was told to decrease her Bumex to 1 mg once daily.  Creatinine was up to 1.88.   Bumex was increased to 1 mg BID in May for edema and creatinine up to 2.15 12/07/2017.  She was asked again to reduce her Bumex.  Patient comes in today for follow-up.  She says overall she is feeling well.  She goes to the [facility] and rides the exercise bike daily.  Her swelling is chronic.  She is waiting to get some compression stockings.  She has not found a pair that fit her properly.  Denies shortness of breath, chest pain, palpitations, dizziness or presyncope.   Past Medical History:  Diagnosis Date  . Atrial flutter ([place]) 04/06/2016  . Bradycardia 04/06/2016   type 1 second degree AV block  . Chronic diastolic (congestive) heart failure ([place]) 01/10/2015  . Degenerative joint disease of hand   . Diabetes mellitus   . Dyslipidemia   . Fecal occult blood test positive   . GERD (gastroesophageal reflux disease)   . Headache   . Hypertension   . Inadequate material resources   . Irritable bowel syndrome   . Obesity   . Post-menopausal bleeding   . Shortness of breath dyspnea    multifactorial from obesity, deconditioning, obesity hypoventilation syndrome    Past Surgical History:  Procedure Laterality Date  .  CHOLECYSTECTOMY    . EYE SURGERY     left lens implant s/p cataracts  . OTHER SURGICAL HISTORY     right shoulder tendon repair 05/2011    Current Medications: Current Meds  Medication Sig  . ACCU-CHEK FASTCLIX LANCETS MISC TEST BLOOD SUGAR THREE TIMES DAILY  . ACCU-CHEK SOFTCLIX LANCETS lancets Use to check blood sugar three times daily. Diagnosis code E11.8  . amitriptyline (ELAVIL) 25 MG tablet Take 0.5 tablets (12.5 mg total) by mouth at bedtime.  Marland Kitchen apixaban (ELIQUIS) 5 MG TABS tablet Take 1 tablet (5 mg total) by mouth 2 (two) times daily.  . B-D UF III MINI PEN NEEDLES 31G X 5 MM MISC USE ONE TIME DAILY TO INJECT VICTOZA. DX:E11.9  . BD INSULIN SYRINGE ULTRAFINE 31G X 15/64" 1 ML MISC USE TO INJECT INSULIN ONE TIME A DAY  . Blood Glucose Monitoring Suppl (ACCU-CHEK AVIVA PLUS) w/Device KIT TEST BLOOD SUGAR THREE TIMES DAILY  . Blood Glucose Monitoring Suppl (ACCU-CHEK NANO SMARTVIEW) w/Device KIT Use to check blood sugar 3 times daily. Dx E11.8  . bumetanide (BUMEX) 1 MG tablet TAKE 1 TABLET EVERY DAY  . clotrimazole (LOTRIMIN) 1 % cream Apply 1 application topically 2 (two) times daily.  Marland Kitchen gabapentin (NEURONTIN) 300 MG capsule TAKE 1 CAPSULE EVERY MORNING, TAKE 1 CAPSULE IN THE AFTERNOON, AND TAKE 2 CAPSULES EVERY NIGHT  . glucose blood (ACCU-CHEK AVIVA PLUS) test strip  Use to check blood sugar three times daily. Diagnosis code E11.8  . insulin detemir (LEVEMIR) 100 UNIT/ML injection Inject 72 units total into the skin at that time  . metFORMIN (GLUCOPHAGE) 1000 MG tablet TAKE 1 TABLET (1,000 MG TOTAL) BY MOUTH 2 (TWO) TIMES DAILY WITH A MEAL.  . Multiple Vitamins-Minerals (HEALTHY EYES PO) Take 1 tablet by mouth daily. Reported on 05/17/2015  . NON FORMULARY Place 2 L into the nose daily. Wears with exertion and qhs  . omeprazole (PRILOSEC) 20 MG capsule TAKE 1 CAPSULE BY MOUTH EVERY DAY  . rosuvastatin (CRESTOR) 20 MG tablet Take 1 tablet (20 mg total) by mouth at bedtime.  Marland Kitchen  VICTOZA 18 MG/3ML SOPN INJECT (1.2 MG TOTAL) INTO THE SKIN DAILY.  . VOLTAREN 1 % GEL APPLY 4 G TOPICALLY 4 (FOUR) TIMES DAILY.     Allergies:   Doxycycline   Social History   Socioeconomic History  . Marital status: Divorced    Spouse name: Not on file  . Number of children: 2  . Years of education: Not on file  . Highest education level: Not on file  Occupational History  . Occupation: unemployed    Fish farm manager: UNEMPLOYED  Social Needs  . Financial resource strain: Not on file  . Food insecurity:    Worry: Not on file    Inability: Not on file  . Transportation needs:    Medical: Not on file    Non-medical: Not on file  Tobacco Use  . Smoking status: Never Smoker  . Smokeless tobacco: Never Used  Substance and Sexual Activity  . Alcohol use: No    Alcohol/week: 0.0 standard drinks  . Drug use: No  . Sexual activity: Not Currently  Lifestyle  . Physical activity:    Days per week: Not on file    Minutes per session: Not on file  . Stress: Not on file  Relationships  . Social connections:    Talks on phone: Not on file    Gets together: Not on file    Attends religious service: Not on file    Active member of club or organization: Not on file    Attends meetings of clubs or organizations: Not on file    Relationship status: Not on file  Other Topics Concern  . Not on file  Social History Narrative   Single.   Divorced x2.   Has 2 children, by two different fathers.   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 May apply for disability/SSI given her pain in her hands which has made interviewing for jobs difficult.   Never Smoked   Alcohol use-no   Drug use-no   Regular exercise-yes      Financial assistance approved for 100% discount at [facility] and has [facility] card per [employer]   02/18/2010              Family History:  The patient's family history includes Diabetes in her mother and  sister; Obesity in her mother; Stroke in her father.   ROS:   Please see the history of present illness.    Review of Systems  Constitution: Positive for weight gain.  HENT: Negative.   Eyes: Negative.   Cardiovascular: Positive for leg swelling.  Respiratory: Negative.   Hematologic/Lymphatic: Negative.   Musculoskeletal: Negative.  Negative for joint pain.  Gastrointestinal: Negative.   Genitourinary: Negative.   Neurological: Negative.  All other systems reviewed and are negative.   PHYSICAL EXAM:   VS:  BP (!) 102/52   Pulse (!) 42   Ht 5' (1.524 m)   Wt 269 lb 1.9 oz (122.1 kg)   SpO2 97%   BMI 52.56 kg/m   Physical Exam  GEN: Obese, in no acute distress  Neck: no JVD, carotid bruits, or masses Cardiac:RRR; distant heart sounds no murmurs, rubs, or gallops  Respiratory:  clear to auscultation bilaterally, normal work of breathing GI: soft, nontender, nondistended, + BS Ext: without cyanosis, clubbing, or edema, Good distal pulses bilaterally Neuro:  Alert and Oriented x 3, Psych: euthymic mood, full affect  Wt Readings from Last 3 Encounters:  12/15/17 269 lb 1.9 oz (122.1 kg)  12/07/17 269 lb 1.6 oz (122.1 kg)  08/31/17 266 lb 4.8 oz (120.8 kg)      Studies/Labs Reviewed:   EKG:  EKG is  ordered today.  The ekg ordered today demonstrates atrial flutter at 42 bpm  Recent Labs: 06/14/2017: Hemoglobin 12.0; Platelets 177 08/31/2017: ALT 7 12/07/2017: BUN 55; Creatinine, Ser 2.15; Potassium 4.5; Sodium 144   Lipid Panel    Component Value Date/Time   CHOL 129 05/04/2017 0917   TRIG 77 05/04/2017 0917   HDL 38 (L) 05/04/2017 0917   CHOLHDL 3.4 05/04/2017 0917   CHOLHDL 3.5 06/07/2014 1027   VLDL 16 06/07/2014 1027   LDLCALC 76 05/04/2017 0917    Additional studies/ records that were reviewed today include:  2D echo 04/28/2016  Study Conclusions   - Left ventricle: The cavity size was normal. Systolic function was   normal. The estimated ejection  fraction was in the range of 55%   to 60%. Wall motion was normal; there were no regional wall   motion abnormalities. - Aortic valve: There was mild regurgitation directed centrally in   the LVOT. - Left atrium: The atrium was mildly dilated. - Right atrium: The atrium was mildly dilated.       ASSESSMENT:    1. Atrial flutter, unspecified type ([place])   2. Chronic diastolic congestive heart failure ([place])   3. Mobitz type 1 second degree atrioventricular block   4. Essential hypertension   5. CKD (chronic kidney disease) stage 3, GFR 30-59 ml/min (HCC)   6. Dyslipidemia      PLAN:  In order of problems listed above:  Atrial flutter was in sinus bradycardia with Mobitz 1 second-degree AV block last office visit asymptomatic.  Today atrial flutter at 42 bpm.  Asymptomatic.  CHA2DS2-VASc equals 5 on Eliquis.  Follow-up with Dr. Radford Pax in 4 5 months.  Chronic diastolic CHF with lower extremity edema.  Awaiting compression stockings which will help.  Weight is 266 actually down 3 pounds.  Was on Bumex twice daily since May and creatinine went up to 2.15.  Has been on 1 mg once daily.  We will recheck today.  Essential hypertension  CKD stage III patient was on higher dose Bumex and creatinine up to 2.15 12/07/2017 recheck today on lower dose Bumex.  Dyslipidemia continue Crestor  Medication Adjustments/Labs and Tests Ordered: Current medicines are reviewed at length with the patient today.  Concerns regarding medicines are outlined above.  Medication changes, Labs and Tests ordered today are listed in the Patient Instructions below. Patient Instructions  Medication Instructions:    Labwork:   Testing/Procedures:   Follow-Up: Your physician recommends that you schedule a follow-up appointment in:    Any Other Special Instructions Will Be  Listed Below (If Applicable).     If you need a refill on your cardiac medications before your next appointment, please call your  pharmacy.      Signed, Ermalinda Barrios, PA-C  12/15/2017 1:22 PM    [facility] [address], [place], [place]  [address] Phone: [phone]; Fax: [phone]

## 2017-12-16 ENCOUNTER — Telehealth: Payer: Self-pay | Admitting: Physician Assistant

## 2017-12-16 LAB — BASIC METABOLIC PANEL
BUN / CREAT RATIO: 24 (ref 12–28)
BUN: 41 mg/dL — ABNORMAL HIGH (ref 8–27)
CHLORIDE: 101 mmol/L (ref 96–106)
CO2: 25 mmol/L (ref 20–29)
Calcium: 9.2 mg/dL (ref 8.7–10.3)
Creatinine, Ser: 1.69 mg/dL — ABNORMAL HIGH (ref 0.57–1.00)
GFR calc Af Amer: 36 mL/min/{1.73_m2} — ABNORMAL LOW (ref >59)
GFR calc non Af Amer: 31 mL/min/{1.73_m2} — ABNORMAL LOW (ref >59)
Glucose: 151 mg/dL — ABNORMAL HIGH (ref 65–99)
POTASSIUM: 4.4 mmol/L (ref 3.5–5.2)
SODIUM: 142 mmol/L (ref 134–144)

## 2017-12-16 NOTE — Telephone Encounter (Signed)
Returned pts call and discussed her lab results.  See result note. 

## 2017-12-16 NOTE — Telephone Encounter (Signed)
-----   Message from Imogene Burn, PA-C sent at 12/16/2017  7:29 AM EDT ----- Kidney function better so stay on current dose of bumex once a day. Glucose was high 151-needs better control-f/u with pcp. No further changes

## 2017-12-16 NOTE — Telephone Encounter (Signed)
Follow Up:    Returning your call from this morning, concerning her lab results.

## 2018-01-04 ENCOUNTER — Encounter: Payer: Self-pay | Admitting: Internal Medicine

## 2018-01-13 ENCOUNTER — Telehealth: Payer: Self-pay | Admitting: Internal Medicine

## 2018-01-13 ENCOUNTER — Other Ambulatory Visit: Payer: Self-pay | Admitting: Internal Medicine

## 2018-01-13 DIAGNOSIS — E1169 Type 2 diabetes mellitus with other specified complication: Secondary | ICD-10-CM

## 2018-01-17 ENCOUNTER — Encounter (INDEPENDENT_AMBULATORY_CARE_PROVIDER_SITE_OTHER): Payer: Medicare HMO | Admitting: Ophthalmology

## 2018-01-17 DIAGNOSIS — I10 Essential (primary) hypertension: Secondary | ICD-10-CM | POA: Diagnosis not present

## 2018-01-17 DIAGNOSIS — E113313 Type 2 diabetes mellitus with moderate nonproliferative diabetic retinopathy with macular edema, bilateral: Secondary | ICD-10-CM

## 2018-01-17 DIAGNOSIS — E11311 Type 2 diabetes mellitus with unspecified diabetic retinopathy with macular edema: Secondary | ICD-10-CM

## 2018-01-17 DIAGNOSIS — H35033 Hypertensive retinopathy, bilateral: Secondary | ICD-10-CM

## 2018-01-17 DIAGNOSIS — H33302 Unspecified retinal break, left eye: Secondary | ICD-10-CM

## 2018-01-17 DIAGNOSIS — H43813 Vitreous degeneration, bilateral: Secondary | ICD-10-CM | POA: Diagnosis not present

## 2018-01-26 ENCOUNTER — Other Ambulatory Visit: Payer: Self-pay | Admitting: Pharmacist

## 2018-01-26 ENCOUNTER — Ambulatory Visit (INDEPENDENT_AMBULATORY_CARE_PROVIDER_SITE_OTHER): Payer: Medicare HMO | Admitting: Pharmacist

## 2018-01-26 DIAGNOSIS — Z794 Long term (current) use of insulin: Secondary | ICD-10-CM

## 2018-01-26 DIAGNOSIS — E1169 Type 2 diabetes mellitus with other specified complication: Secondary | ICD-10-CM

## 2018-01-26 NOTE — Progress Notes (Signed)
Documentation for Freestyle Libre Pro Continuous glucose monitoring Freestyle Libre Pro CGM sensor placed and started on Kathleen [name] who was identified by name and date of birth.  Patient was educated about wearing sensor, keeping food, activity and medication log and when to call office.She was educated about how to care for the sensor and not to have an MRI, CT or Diathermy while wearing the sensor. Follow up was arranged with the patient for 02/02/18.   Lot #:[id number] Serial #:[id number] Expiration Date:07/26/2018 [name], Student-PharmD 01/26/2018 1:22 PM.

## 2018-01-26 NOTE — Patient Instructions (Signed)
Please record the time, amount and what food drinks and activities you have while wearing the continuous glucose monitor(CGM) in the folder provided.  Bring the folder with you to follow up appointments  Do not have a CT or an MRI while wearing the CGM.   Please make an appointment for 1 week with me and a doctor for the first of two CGM downloads..   You will also return in 2 weeks to have your second download and the CGM removed.  

## 2018-01-28 ENCOUNTER — Other Ambulatory Visit: Payer: Self-pay | Admitting: *Deleted

## 2018-02-02 ENCOUNTER — Ambulatory Visit: Payer: Medicare HMO

## 2018-02-02 ENCOUNTER — Ambulatory Visit: Payer: Medicare HMO | Admitting: Pharmacist

## 2018-02-02 ENCOUNTER — Ambulatory Visit (INDEPENDENT_AMBULATORY_CARE_PROVIDER_SITE_OTHER): Payer: Medicare HMO | Admitting: Pharmacist

## 2018-02-02 DIAGNOSIS — Z794 Long term (current) use of insulin: Secondary | ICD-10-CM | POA: Diagnosis not present

## 2018-02-02 DIAGNOSIS — E1169 Type 2 diabetes mellitus with other specified complication: Secondary | ICD-10-CM

## 2018-02-02 DIAGNOSIS — Z23 Encounter for immunization: Secondary | ICD-10-CM

## 2018-02-02 DIAGNOSIS — Z0000 Encounter for general adult medical examination without abnormal findings: Secondary | ICD-10-CM

## 2018-02-02 NOTE — Progress Notes (Signed)
S: Kathleen [name] is a 67 y.o. female reports to clinical pharmacist appointment for continuous glucose monitoring.  Allergies  Allergen Reactions  . Doxycycline     REACTION: Wheals and pruritus   Medication Sig  ACCU-CHEK FASTCLIX LANCETS MISC TEST BLOOD SUGAR THREE TIMES DAILY  ACCU-CHEK SOFTCLIX LANCETS lancets Use to check blood sugar three times daily. Diagnosis code E11.8  amitriptyline (ELAVIL) 25 MG tablet Take 0.5 tablets (12.5 mg total) by mouth at bedtime.  apixaban (ELIQUIS) 5 MG TABS tablet Take 1 tablet (5 mg total) by mouth 2 (two) times daily.  B-D UF III MINI PEN NEEDLES 31G X 5 MM MISC USE ONE TIME DAILY TO INJECT VICTOZA. DX:E11.9  BD VEO INSULIN SYRINGE U/F 31G X 15/64" 1 ML MISC USE TO INJECT INSULIN ONE TIME A DAY  Blood Glucose Monitoring Suppl (ACCU-CHEK AVIVA PLUS) w/Device KIT TEST BLOOD SUGAR THREE TIMES DAILY  Blood Glucose Monitoring Suppl (ACCU-CHEK NANO SMARTVIEW) w/Device KIT Use to check blood sugar 3 times daily. Dx E11.8  bumetanide (BUMEX) 1 MG tablet TAKE 1 TABLET EVERY DAY  clotrimazole (LOTRIMIN) 1 % cream Apply 1 application topically 2 (two) times daily.  gabapentin (NEURONTIN) 300 MG capsule TAKE 1 CAPSULE EVERY MORNING, TAKE 1 CAPSULE IN THE AFTERNOON, AND TAKE 2 CAPSULES EVERY NIGHT  glucose blood (ACCU-CHEK AVIVA PLUS) test strip Use to check blood sugar three times daily. Diagnosis code E11.8  insulin detemir (LEVEMIR) 100 UNIT/ML injection Inject 72 units total into the skin at that time  metFORMIN (GLUCOPHAGE) 1000 MG tablet TAKE 1 TABLET (1,000 MG TOTAL) BY MOUTH 2 (TWO) TIMES DAILY WITH A MEAL.  Multiple Vitamins-Minerals (HEALTHY EYES PO) Take 1 tablet by mouth daily. Reported on 05/17/2015  NON FORMULARY Place 2 L into the nose daily. Wears with exertion and qhs  omeprazole (PRILOSEC) 20 MG capsule TAKE 1 CAPSULE BY MOUTH EVERY DAY  rosuvastatin (CRESTOR) 20 MG tablet Take 1 tablet (20 mg total) by mouth at bedtime.  VICTOZA 18 MG/3ML  SOPN INJECT (1.2 MG TOTAL) INTO THE SKIN DAILY.  VOLTAREN 1 % GEL APPLY 4 G TOPICALLY 4 (FOUR) TIMES DAILY.   Past Medical History:  Diagnosis Date  . Atrial flutter ([place]) 04/06/2016  . Bradycardia 04/06/2016   type 1 second degree AV block  . Chronic diastolic (congestive) heart failure ([place]) 01/10/2015  . Degenerative joint disease of hand   . Diabetes mellitus   . Dyslipidemia   . Fecal occult blood test positive   . GERD (gastroesophageal reflux disease)   . Headache   . Hypertension   . Inadequate material resources   . Irritable bowel syndrome   . Obesity   . Post-menopausal bleeding   . Shortness of breath dyspnea    multifactorial from obesity, deconditioning, obesity hypoventilation syndrome   Social History   Socioeconomic History  . Marital status: Divorced    Spouse name: Not on file  . Number of children: 2  . Years of education: Not on file  . Highest education level: Not on file  Occupational History  . Occupation: unemployed    Fish farm manager: UNEMPLOYED  Social Needs  . Financial resource strain: Not on file  . Food insecurity:    Worry: Not on file    Inability: Not on file  . Transportation needs:    Medical: Not on file    Non-medical: Not on file  Tobacco Use  . Smoking status: Never Smoker  . Smokeless tobacco: Never Used  Substance and Sexual  Activity  . Alcohol use: No    Alcohol/week: 0.0 standard drinks  . Drug use: No  . Sexual activity: Not Currently  Lifestyle  . Physical activity:    Days per week: Not on file    Minutes per session: Not on file  . Stress: Not on file  Relationships  . Social connections:    Talks on phone: Not on file    Gets together: Not on file    Attends religious service: Not on file    Active member of club or organization: Not on file    Attends meetings of clubs or organizations: Not on file    Relationship status: Not on file  Other Topics Concern  . Not on file  Social History Narrative   Single.    Divorced x2.   Has 2 children, by two different fathers.   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 May apply for disability/SSI given her pain in her hands which has made interviewing for jobs difficult.   Never Smoked   Alcohol use-no   Drug use-no   Regular exercise-yes      Financial assistance approved for 100% discount at [facility] and has [facility] card per Enedina Finner   02/18/2010            Family History  Problem Relation Age of Onset  . Diabetes Mother   . Obesity Mother   . Stroke Father   . Diabetes Sister   . Colon cancer Neg Hx    O: Component Value Date/Time   CHOL 129 05/04/2017 0917   HDL 38 (L) 05/04/2017 0917   TRIG 77 05/04/2017 0917   AST 11 08/31/2017 0915   ALT 7 08/31/2017 0915   NA 142 12/15/2017 1329   K 4.4 12/15/2017 1329   CL 101 12/15/2017 1329   CO2 25 12/15/2017 1329   GLUCOSE 151 (H) 12/15/2017 1329   GLUCOSE 107 (H) 04/06/2016 1424   HGBA1C 7.7 (A) 12/07/2017 0958   HGBA1C 6.9 (H) 01/11/2015 1548   BUN 41 (H) 12/15/2017 1329   CREATININE 1.69 (H) 12/15/2017 1329   CREATININE 1.60 (H) 04/06/2016 1424   CALCIUM 9.2 12/15/2017 1329   GFRNONAA 31 (L) 12/15/2017 1329   GFRNONAA 65 06/07/2014 1027   GFRAA 36 (L) 12/15/2017 1329   GFRAA 75 06/07/2014 1027   WBC 9.2 06/14/2017 1340   WBC 9.4 04/06/2016 1424   HGB 12.0 06/14/2017 1340   HCT 35.2 06/14/2017 1340   PLT 177 06/14/2017 1340   TSH 4.466 01/11/2015 0306   TSH 3.941 05/23/2013 0705   Ht Readings from Last 2 Encounters:  12/15/17 5' (1.524 m)  12/07/17 5' (1.524 m)   Wt Readings from Last 2 Encounters:  12/15/17 269 lb 1.9 oz (122.1 kg)  12/07/17 269 lb 1.6 oz (122.1 kg)   There is no height or weight on file to calculate BMI. BP Readings from Last 3 Encounters:  12/15/17 (!) 102/52  12/07/17 (!) 116/45  08/31/17 (!) 138/52    A/P: Kathleen [name] Libre Results Downloaded and Reviewed Week #1 of  monitoring 7-day BG average 162, within target range 64% of the time Above 180 mg/dL 35% of the time, highest around nighttime Below 70 mg/dL 1% of the time, lowest around morning Coefficient of variation 41% Standard deviation: 66.5 mg/dL  No changes made today while awaiting results of BMET, may reduce insulin detemir dose and titrate  liraglutide to 1.8 mg daily.  An after visit summary was provided and patient advised to follow up in 1 week or sooner if any changes in condition or questions regarding medications arise.   The patient verbalized understanding of information provided by repeating back concepts discussed.   15 minutes spent face-to-face with the patient during the encounter. 50% of time spent on education. 50% of time was spent on assessment, plan, coordination of care.

## 2018-02-02 NOTE — Patient Outreach (Signed)
[facility]) Care Management  [id number]  FABIENNE NOLASCO 02/11/1951 [id number]  RN Health Coach telephone call to patient.  [employer] compliance verified. Per patient she is doing good. Patient has a free style libre. Per patient she is really enjoying it and is following the monitoring instructions and writing down her eating habits and activities. Patient is going to [facility] and exercising. She [place] also [place] Per patient she is monitoring her diet. Patient has not had any hypoglycemia reactions.  Patient stated that she has gotten a medical alert system that [facility] had sent. Patient has agreed to follow up outreach calls.    Current Medications:  Current Outpatient Medications  Medication Sig Dispense Refill  . ACCU-CHEK FASTCLIX LANCETS MISC TEST BLOOD SUGAR THREE TIMES DAILY 306 each 1  . ACCU-CHEK SOFTCLIX LANCETS lancets Use to check blood sugar three times daily. Diagnosis code E11.8 100 each 12  . amitriptyline (ELAVIL) 25 MG tablet Take 0.5 tablets (12.5 mg total) by mouth at bedtime. 30 tablet 0  . apixaban (ELIQUIS) 5 MG TABS tablet Take 1 tablet (5 mg total) by mouth 2 (two) times daily. 180 tablet 2  . B-D UF III MINI PEN NEEDLES 31G X 5 MM MISC USE ONE TIME DAILY TO INJECT VICTOZA. DX:E11.9 100 each 4  . BD VEO INSULIN SYRINGE U/F 31G X 15/64" 1 ML MISC USE TO INJECT INSULIN ONE TIME A DAY 100 each 5  . Blood Glucose Monitoring Suppl (ACCU-CHEK AVIVA PLUS) w/Device KIT TEST BLOOD SUGAR THREE TIMES DAILY 1 kit 0  . Blood Glucose Monitoring Suppl (ACCU-CHEK NANO SMARTVIEW) w/Device KIT Use to check blood sugar 3 times daily. Dx E11.8 1 kit 0  . bumetanide (BUMEX) 1 MG tablet TAKE 1 TABLET EVERY DAY 90 tablet 2  . clotrimazole (LOTRIMIN) 1 % cream Apply 1 application topically 2 (two) times daily. 30 g 0  . gabapentin (NEURONTIN) 300 MG capsule TAKE 1 CAPSULE EVERY MORNING, TAKE 1 CAPSULE IN THE AFTERNOON, AND TAKE 2 CAPSULES  EVERY NIGHT 360 capsule 3  . glucose blood (ACCU-CHEK AVIVA PLUS) test strip Use to check blood sugar three times daily. Diagnosis code E11.8 100 each 12  . insulin detemir (LEVEMIR) 100 UNIT/ML injection Inject 72 units total into the skin at that time 30 mL 5  . metFORMIN (GLUCOPHAGE) 1000 MG tablet TAKE 1 TABLET (1,000 MG TOTAL) BY MOUTH 2 (TWO) TIMES DAILY WITH A MEAL. 180 tablet 3  . Multiple Vitamins-Minerals (HEALTHY EYES PO) Take 1 tablet by mouth daily. Reported on 05/17/2015    . NON FORMULARY Place 2 L into the nose daily. Wears with exertion and qhs    . omeprazole (PRILOSEC) 20 MG capsule TAKE 1 CAPSULE BY MOUTH EVERY DAY 90 capsule 3  . rosuvastatin (CRESTOR) 20 MG tablet Take 1 tablet (20 mg total) by mouth at bedtime. 90 tablet 1  . VICTOZA 18 MG/3ML SOPN INJECT (1.2 MG TOTAL) INTO THE SKIN DAILY. 9 pen 3  . VOLTAREN 1 % GEL APPLY 4 G TOPICALLY 4 (FOUR) TIMES DAILY. 100 g 2   No current facility-administered medications for this visit.     Functional Status:  In your present state of health, do you have any difficulty performing the following activities: 12/07/2017 10/22/2017  Hearing? N N  Vision? N Y  Comment Laser Surgery  -  Difficulty concentrating or making decisions? N N  Walking or climbing stairs? Y Y  Dressing or  bathing? N N  Doing errands, shopping? N N  Preparing Food and eating ? - N  Using the Toilet? - N  In the past six months, have you accidently leaked urine? - N  Do you have problems with loss of bowel control? - N  Managing your Medications? - N  Managing your Finances? - N  Housekeeping or managing your Housekeeping? - N  Some recent data might be hidden    Fall/Depression Screening: Fall Risk  12/07/2017 11/29/2017 10/22/2017  Falls in the past year? No - No  Number falls in past yr: - - -  Comment - - -  Injury with Fall? - - -  Comment - - -  Risk Factor Category  - - -  Risk for fall due to : - - -  Risk for fall due to: Comment - - -   Follow up - Falls evaluation completed -   PHQ 2/9 Scores 12/07/2017 11/29/2017 10/22/2017 08/31/2017 08/05/2017 06/07/2017 05/06/2017  PHQ - 2 Score 0 0 0 0 0 0 0   THN CM Care Plan Problem One     Most Recent Value  Care Plan Problem One  Knowledge deficit in self management of diabetes  Role Documenting the Problem One  [place] for Problem One  Active  THN Long Term Goal   Patient will report following up with health maintenance appointments within the next 90days  [place] Start Date  02/02/18  Interventions for Problem One Long Term Goal  RN reiterated keeping appointment, medication adherence and flu shots. RN will follow up for compliance  THN CM Short Term Goal #1   Patient will make sure she gets properly fitted support hose within the next 30 days  THN CM Short Term Goal #1 Start Date  02/02/18  Interventions for Short Term Goal #1  RN explained to paint about how to measure. Patient will send Health Coach her measurements. Health coach will look into getyting properly fitting hose.   THN CM Short Term Goal #2   Patient will report a reduction of hypoglycemic events in the next 30 days  THN CM Short Term Goal #2 Met Date  01/28/18  [facility] CM Short Term Goal #3  patient will report looking into other weight loss measures within the next 30 days      Assessment:  Patient has a free style libre Patient is going to [facility] Patient is trying to monitor her weight diet and blood sugars closely Patient has a medical alert system Plan:  Patient will follow up with health maintenance care (flu shot) Patient will follow up with sending measurements for Health Coach to assist with support hose RN will follow up within the month of December  Ren Aspinall [place] Care Management [phone]

## 2018-02-03 LAB — BMP8+ANION GAP
ANION GAP: 17 mmol/L (ref 10.0–18.0)
BUN/Creatinine Ratio: 21 (ref 12–28)
BUN: 40 mg/dL — AB (ref 8–27)
CO2: 27 mmol/L (ref 20–29)
Calcium: 9.3 mg/dL (ref 8.7–10.3)
Chloride: 101 mmol/L (ref 96–106)
Creatinine, Ser: 1.92 mg/dL — ABNORMAL HIGH (ref 0.57–1.00)
GFR calc Af Amer: 31 mL/min/{1.73_m2} — ABNORMAL LOW (ref >59)
GFR calc non Af Amer: 27 mL/min/{1.73_m2} — ABNORMAL LOW (ref >59)
Glucose: 79 mg/dL (ref 65–99)
Potassium: 4.9 mmol/L (ref 3.5–5.2)
Sodium: 145 mmol/L — ABNORMAL HIGH (ref 134–144)

## 2018-02-04 ENCOUNTER — Encounter: Payer: Self-pay | Admitting: Cardiology

## 2018-02-08 ENCOUNTER — Telehealth: Payer: Self-pay | Admitting: Dietician

## 2018-02-08 ENCOUNTER — Other Ambulatory Visit: Payer: Self-pay | Admitting: Internal Medicine

## 2018-02-08 DIAGNOSIS — E785 Hyperlipidemia, unspecified: Secondary | ICD-10-CM

## 2018-02-08 DIAGNOSIS — G629 Polyneuropathy, unspecified: Secondary | ICD-10-CM

## 2018-02-08 NOTE — Telephone Encounter (Signed)
Ms. Cullom called about CGM sensor falling off on sunday. This should give 5 more day s of gl;ucose data for a total of 12. I left a message to encourage Ms. Garmany to keep her appointments tomorrow and bring the sensor with her.

## 2018-02-09 ENCOUNTER — Ambulatory Visit: Payer: Medicare HMO | Admitting: Pharmacist

## 2018-02-09 ENCOUNTER — Ambulatory Visit (INDEPENDENT_AMBULATORY_CARE_PROVIDER_SITE_OTHER): Payer: Medicare HMO | Admitting: Internal Medicine

## 2018-02-09 ENCOUNTER — Other Ambulatory Visit: Payer: Self-pay

## 2018-02-09 DIAGNOSIS — N183 Chronic kidney disease, stage 3 unspecified: Secondary | ICD-10-CM

## 2018-02-09 DIAGNOSIS — Z794 Long term (current) use of insulin: Principal | ICD-10-CM

## 2018-02-09 DIAGNOSIS — E1122 Type 2 diabetes mellitus with diabetic chronic kidney disease: Secondary | ICD-10-CM

## 2018-02-09 DIAGNOSIS — E1169 Type 2 diabetes mellitus with other specified complication: Secondary | ICD-10-CM

## 2018-02-09 DIAGNOSIS — R202 Paresthesia of skin: Secondary | ICD-10-CM

## 2018-02-09 MED ORDER — METFORMIN HCL ER 500 MG PO TB24: 500.0000 mg | ORAL_TABLET | Freq: Two times a day (BID) | ORAL | 2 refills | Status: DC

## 2018-02-09 MED ORDER — LIRAGLUTIDE 18 MG/3ML ~~LOC~~ SOPN: 1.8000 mg | PEN_INJECTOR | Freq: Every day | SUBCUTANEOUS | 3 refills | Status: AC

## 2018-02-09 NOTE — Assessment & Plan Note (Signed)
Kathleen [name] wore the CGM for 14 days and was removed today. The average reading was 169, % time in target was 60, % time below target was 1, and % time above target was 39. Intervention will be to decreased metformin as she has ongoing CKD Stage III and will increase Victoza to 1.8mg . This appears stable but her GFR dropped below 30 on last BMP.   - f/u two weeks for BMP, HgbA1c check, requested patient bring glucometer readings at follow-up  - decrease metformin 1000 mg one 1/2 tablet bid  - increase victoza to 1.8 mg - instructed her to call if any signs or symptoms of hypoglycemia

## 2018-02-09 NOTE — Progress Notes (Addendum)
S: Kathleen [name] is a 67 y.o. female reports to clinical pharmacist appointment for diabetes management.  Allergies  Allergen Reactions  . Doxycycline     REACTION: Wheals and pruritus   Medication Sig  ACCU-CHEK FASTCLIX LANCETS MISC TEST BLOOD SUGAR THREE TIMES DAILY  ACCU-CHEK SOFTCLIX LANCETS lancets Use to check blood sugar three times daily. Diagnosis code E11.8  amitriptyline (ELAVIL) 25 MG tablet TAKE 1 TABLET (25 MG TOTAL) BY MOUTH AT BEDTIME.  apixaban (ELIQUIS) 5 MG TABS tablet Take 1 tablet (5 mg total) by mouth 2 (two) times daily.  B-D UF III MINI PEN NEEDLES 31G X 5 MM MISC USE ONE TIME DAILY TO INJECT VICTOZA. DX:E11.9  BD VEO INSULIN SYRINGE U/F 31G X 15/64" 1 ML MISC USE TO INJECT INSULIN ONE TIME A DAY  Blood Glucose Monitoring Suppl (ACCU-CHEK AVIVA PLUS) w/Device KIT TEST BLOOD SUGAR THREE TIMES DAILY  Blood Glucose Monitoring Suppl (ACCU-CHEK NANO SMARTVIEW) w/Device KIT Use to check blood sugar 3 times daily. Dx E11.8  bumetanide (BUMEX) 1 MG tablet TAKE 1 TABLET EVERY DAY  clotrimazole (LOTRIMIN) 1 % cream Apply 1 application topically 2 (two) times daily.  gabapentin (NEURONTIN) 300 MG capsule TAKE 1 CAPSULE EVERY MORNING, TAKE 1 CAPSULE IN THE AFTERNOON, AND TAKE 2 CAPSULES EVERY NIGHT  glucose blood (ACCU-CHEK AVIVA PLUS) test strip Use to check blood sugar three times daily. Diagnosis code E11.8  insulin detemir (LEVEMIR) 100 UNIT/ML injection Inject 72 units total into the skin at that time  liraglutide (VICTOZA) 18 MG/3ML SOPN Inject 0.3 mLs (1.8 mg total) into the skin daily.  Multiple Vitamins-Minerals (HEALTHY EYES PO) Take 1 tablet by mouth daily. Reported on 05/17/2015  NON FORMULARY Place 2 L into the nose daily. Wears with exertion and qhs  omeprazole (PRILOSEC) 20 MG capsule TAKE 1 CAPSULE BY MOUTH EVERY DAY  rosuvastatin (CRESTOR) 20 MG tablet TAKE 1 TABLET (20 MG TOTAL) BY MOUTH AT BEDTIME.  VOLTAREN 1 % GEL APPLY 4 G TOPICALLY 4 (FOUR) TIMES DAILY.    Past Medical History:  Diagnosis Date  . Atrial flutter ([place]) 04/06/2016  . Bradycardia 04/06/2016   type 1 second degree AV block  . Chronic diastolic (congestive) heart failure ([place]) 01/10/2015  . Degenerative joint disease of hand   . Diabetes mellitus   . Dyslipidemia   . Fecal occult blood test positive   . GERD (gastroesophageal reflux disease)   . Headache   . Hypertension   . Inadequate material resources   . Irritable bowel syndrome   . Obesity   . Post-menopausal bleeding   . Shortness of breath dyspnea    multifactorial from obesity, deconditioning, obesity hypoventilation syndrome   Social History   Socioeconomic History  . Marital status: Divorced    Spouse name: Not on file  . Number of children: 2  . Years of education: Not on file  . Highest education level: Not on file  Occupational History  . Occupation: unemployed    Fish farm manager: UNEMPLOYED  Social Needs  . Financial resource strain: Not on file  . Food insecurity:    Worry: Not on file    Inability: Not on file  . Transportation needs:    Medical: Not on file    Non-medical: Not on file  Tobacco Use  . Smoking status: Never Smoker  . Smokeless tobacco: Never Used  Substance and Sexual Activity  . Alcohol use: No    Alcohol/week: 0.0 standard drinks  . Drug use: No  .  Sexual activity: Not Currently  Lifestyle  . Physical activity:    Days per week: Not on file    Minutes per session: Not on file  . Stress: Not on file  Relationships  . Social connections:    Talks on phone: Not on file    Gets together: Not on file    Attends religious service: Not on file    Active member of club or organization: Not on file    Attends meetings of clubs or organizations: Not on file    Relationship status: Not on file  Other Topics Concern  . Not on file  Social History Narrative   Single.   Divorced x2.   Has 2 children, by two different fathers.   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 May apply for disability/SSI given her pain in her hands which has made interviewing for jobs difficult.   Never Smoked   Alcohol use-no   Drug use-no   Regular exercise-yes      Financial assistance approved for 100% discount at [facility] and has [facility] card per Enedina Finner   02/18/2010            Family History  Problem Relation Age of Onset  . Diabetes Mother   . Obesity Mother   . Stroke Father   . Diabetes Sister   . Colon cancer Neg Hx    O:    Component Value Date/Time   CHOL 129 05/04/2017 0917   HDL 38 (L) 05/04/2017 0917   TRIG 77 05/04/2017 0917   AST 11 08/31/2017 0915   ALT 7 08/31/2017 0915   NA 145 (H) 02/02/2018 1158   K 4.9 02/02/2018 1158   CL 101 02/02/2018 1158   CO2 27 02/02/2018 1158   GLUCOSE 79 02/02/2018 1158   GLUCOSE 107 (H) 04/06/2016 1424   HGBA1C 7.7 (A) 12/07/2017 0958   HGBA1C 6.9 (H) 01/11/2015 1548   BUN 40 (H) 02/02/2018 1158   CREATININE 1.92 (H) 02/02/2018 1158   CREATININE 1.60 (H) 04/06/2016 1424   CALCIUM 9.3 02/02/2018 1158   GFRNONAA 27 (L) 02/02/2018 1158   GFRNONAA 65 06/07/2014 1027   GFRAA 31 (L) 02/02/2018 1158   GFRAA 75 06/07/2014 1027   WBC 9.2 06/14/2017 1340   WBC 9.4 04/06/2016 1424   HGB 12.0 06/14/2017 1340   HCT 35.2 06/14/2017 1340   PLT 177 06/14/2017 1340   TSH 4.466 01/11/2015 0306   TSH 3.941 05/23/2013 0705   Ht Readings from Last 2 Encounters:  02/09/18 5' (1.524 m)  12/15/17 5' (1.524 m)   Wt Readings from Last 2 Encounters:  02/09/18 278 lb 12.8 oz (126.5 kg)  12/15/17 269 lb 1.9 oz (122.1 kg)   There is no height or weight on file to calculate BMI. BP Readings from Last 3 Encounters:  02/09/18 (!) 132/57  12/15/17 (!) 102/52  12/07/17 (!) 116/45   A/P: Patient was seen today in a co-visit with Dr. Sharon Seller.   See documentation under Dr. Aurelio Jew visit for details.

## 2018-02-09 NOTE — Progress Notes (Signed)
S: Kathleen [name] is a 67 y.o. female reports to clinical pharmacist appointment for CGM reading #1  Allergies  Allergen Reactions  . Doxycycline     REACTION: Wheals and pruritus   Prior to Admission medications   Medication Sig Start Date End Date Taking? Authorizing Provider  ACCU-CHEK FASTCLIX LANCETS MISC TEST BLOOD SUGAR THREE TIMES DAILY 07/13/17   Aldine Contes, MD  ACCU-CHEK SOFTCLIX LANCETS lancets Use to check blood sugar three times daily. Diagnosis code E11.8 08/03/17   Aldine Contes, MD  amitriptyline (ELAVIL) 25 MG tablet TAKE 1 TABLET (25 MG TOTAL) BY MOUTH AT BEDTIME. 02/09/18   Aldine Contes, MD  apixaban (ELIQUIS) 5 MG TABS tablet Take 1 tablet (5 mg total) by mouth 2 (two) times daily. 12/07/17   Aldine Contes, MD  B-D UF III MINI PEN NEEDLES 31G X 5 MM MISC USE ONE TIME DAILY TO INJECT VICTOZA. DX:E11.9 08/24/17   Aldine Contes, MD  BD VEO INSULIN SYRINGE U/F 31G X 15/64" 1 ML MISC USE TO INJECT INSULIN ONE TIME A DAY 01/13/18   Aldine Contes, MD  Blood Glucose Monitoring Suppl (ACCU-CHEK AVIVA PLUS) w/Device KIT TEST BLOOD SUGAR THREE TIMES DAILY 10/05/17   Aldine Contes, MD  Blood Glucose Monitoring Suppl (ACCU-CHEK NANO SMARTVIEW) w/Device KIT Use to check blood sugar 3 times daily. Dx E11.8 07/23/17   Aldine Contes, MD  bumetanide (BUMEX) 1 MG tablet TAKE 1 TABLET EVERY DAY 10/11/17   Sueanne Margarita, MD  clotrimazole (LOTRIMIN) 1 % cream Apply 1 application topically 2 (two) times daily. 09/01/17   Aldine Contes, MD  gabapentin (NEURONTIN) 300 MG capsule TAKE 1 CAPSULE EVERY MORNING, TAKE 1 CAPSULE IN THE AFTERNOON, AND TAKE 2 CAPSULES EVERY NIGHT 12/07/17   Aldine Contes, MD  glucose blood (ACCU-CHEK AVIVA PLUS) test strip Use to check blood sugar three times daily. Diagnosis code E11.8 08/03/17   Aldine Contes, MD  insulin detemir (LEVEMIR) 100 UNIT/ML injection Inject 72 units total into the skin at that time 10/21/17   Aldine Contes, MD  liraglutide (VICTOZA) 18 MG/3ML SOPN Inject 0.3 mLs (1.8 mg total) into the skin daily. 02/09/18   Seawell, Jaimie A, DO  Multiple Vitamins-Minerals (HEALTHY EYES PO) Take 1 tablet by mouth daily. Reported on 05/17/2015    [provider]  NON FORMULARY Place 2 L into the nose daily. Wears with exertion and qhs    [provider]  omeprazole (PRILOSEC) 20 MG capsule TAKE 1 CAPSULE BY MOUTH EVERY DAY 03/01/17   Aldine Contes, MD  rosuvastatin (CRESTOR) 20 MG tablet TAKE 1 TABLET (20 MG TOTAL) BY MOUTH AT BEDTIME. 02/09/18 02/09/19  Aldine Contes, MD  VOLTAREN 1 % GEL APPLY 4 G TOPICALLY 4 (FOUR) TIMES DAILY. 03/29/17   Aldine Contes, MD   Past Medical History:  Diagnosis Date  . Atrial flutter ([place]) 04/06/2016  . Bradycardia 04/06/2016   type 1 second degree AV block  . Chronic diastolic (congestive) heart failure ([place]) 01/10/2015  . Degenerative joint disease of hand   . Diabetes mellitus   . Dyslipidemia   . Fecal occult blood test positive   . GERD (gastroesophageal reflux disease)   . Headache   . Hypertension   . Inadequate material resources   . Irritable bowel syndrome   . Obesity   . Post-menopausal bleeding   . Shortness of breath dyspnea    multifactorial from obesity, deconditioning, obesity hypoventilation syndrome   Social History   Socioeconomic History  .  Marital status: Divorced    Spouse name: Not on file  . Number of children: 2  . Years of education: Not on file  . Highest education level: Not on file  Occupational History  . Occupation: unemployed    Fish farm manager: UNEMPLOYED  Social Needs  . Financial resource strain: Not on file  . Food insecurity:    Worry: Not on file    Inability: Not on file  . Transportation needs:    Medical: Not on file    Non-medical: Not on file  Tobacco Use  . Smoking status: Never Smoker  . Smokeless tobacco: Never Used  Substance and Sexual Activity  . Alcohol use: No     Alcohol/week: 0.0 standard drinks  . Drug use: No  . Sexual activity: Not Currently  Lifestyle  . Physical activity:    Days per week: Not on file    Minutes per session: Not on file  . Stress: Not on file  Relationships  . Social connections:    Talks on phone: Not on file    Gets together: Not on file    Attends religious service: Not on file    Active member of club or organization: Not on file    Attends meetings of clubs or organizations: Not on file    Relationship status: Not on file  Other Topics Concern  . Not on file  Social History Narrative   Single.   Divorced x2.   Has 2 children, by two different fathers.   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 May apply for disability/SSI given her pain in her hands which has made interviewing for jobs difficult.   Never Smoked   Alcohol use-no   Drug use-no   Regular exercise-yes      Financial assistance approved for 100% discount at [facility] and has [facility] card per Enedina Finner   02/18/2010            Family History  Problem Relation Age of Onset  . Diabetes Mother   . Obesity Mother   . Stroke Father   . Diabetes Sister   . Colon cancer Neg Hx     O:    Component Value Date/Time   CHOL 129 05/04/2017 0917   HDL 38 (L) 05/04/2017 0917   TRIG 77 05/04/2017 0917   AST 11 08/31/2017 0915   ALT 7 08/31/2017 0915   NA 145 (H) 02/02/2018 1158   K 4.9 02/02/2018 1158   CL 101 02/02/2018 1158   CO2 27 02/02/2018 1158   GLUCOSE 79 02/02/2018 1158   GLUCOSE 107 (H) 04/06/2016 1424   HGBA1C 7.7 (A) 12/07/2017 0958   HGBA1C 6.9 (H) 01/11/2015 1548   BUN 40 (H) 02/02/2018 1158   CREATININE 1.92 (H) 02/02/2018 1158   CREATININE 1.60 (H) 04/06/2016 1424   CALCIUM 9.3 02/02/2018 1158   GFRNONAA 27 (L) 02/02/2018 1158   GFRNONAA 65 06/07/2014 1027   GFRAA 31 (L) 02/02/2018 1158   GFRAA 75 06/07/2014 1027   WBC 9.2 06/14/2017 1340   WBC 9.4  04/06/2016 1424   HGB 12.0 06/14/2017 1340   HCT 35.2 06/14/2017 1340   PLT 177 06/14/2017 1340   TSH 4.466 01/11/2015 0306   TSH 3.941 05/23/2013 0705   Ht Readings from Last 2 Encounters:  02/09/18 5' (1.524 m)  12/15/17 5' (1.524 m)   Wt Readings from Last 2 Encounters:  02/09/18  278 lb 12.8 oz (126.5 kg)  12/15/17 269 lb 1.9 oz (122.1 kg)   There is no height or weight on file to calculate BMI. BP Readings from Last 3 Encounters:  02/09/18 (!) 132/57  12/15/17 (!) 102/52  12/07/17 (!) 116/45     A/P:  CGM Results: Above 180 mg/dL 39% of the time In target range 60% of the time Below 70 mg/dL 1% of the time  Most recent BMET shows eGFR of 27 ml/min. Recommended that metformin dose be decreased to 1000 mg daily and Victoza dose be increased to 1.8 mg daily.   This was a co-visit with Dr. Sharon Seller, see her note for details.   [name]  PharmD Student

## 2018-02-09 NOTE — Patient Instructions (Addendum)
Thank you for allowing [place] to provide your care today. Today we discussed your type II diabetes.   Today we made two changes to your medications.    Please decrease your metformin 1000 mg to one 1/2 tablet twice per day.    Please increase your Victoza 1.8 mg once per day.   Please continue to check your glucose three times per day and bring your glucose readings when you return. Please monitor for signs of hypoglycemia such as dizziness, sweating, or weakness.   Please follow-up in two weeks.    Should you have any questions or concerns please call the internal medicine clinic at [phone].

## 2018-02-09 NOTE — Progress Notes (Signed)
   CC: CGM removal  HPI:  [name] is a 67 y.o. with PMH as below here for CGM removal and follow-up.   Please see A&P for assessment of the patient's chronic medical conditions.     Past Medical History:  Diagnosis Date  . Atrial flutter ([place]) 04/06/2016  . Bradycardia 04/06/2016   type 1 second degree AV block  . Chronic diastolic (congestive) heart failure (Kathleen [name]) 01/10/2015  . Degenerative joint disease of hand   . Diabetes mellitus   . Dyslipidemia   . Fecal occult blood test positive   . GERD (gastroesophageal reflux disease)   . Headache   . Hypertension   . Inadequate material resources   . Irritable bowel syndrome   . Obesity   . Post-menopausal bleeding   . Shortness of breath dyspnea    multifactorial from obesity, deconditioning, obesity hypoventilation syndrome   Review of Systems:   Review of Systems  Constitutional: Negative for diaphoresis, malaise/fatigue and weight loss.  HENT: Negative for congestion, sinus pain and sore throat.   Respiratory: Negative for cough, shortness of breath and wheezing.   Cardiovascular: Negative for chest pain, palpitations and leg swelling.  Gastrointestinal: Negative for constipation, diarrhea, nausea and vomiting.  Genitourinary: Negative for dysuria, frequency and urgency.  Neurological: Positive for tingling. Negative for sensory change and focal weakness.    Physical Exam:  Constitution: NAD, cooperative HENT: [place], AT Cardio: RRR, no m/r/g Respiratory: CTAB, no w/r/r MSK: no pitting edema, +radial pusles Skin: c/d/i    Vitals:   02/09/18 1126  BP: (!) 132/57  Pulse: (!) 56  Temp: 98.1 F (36.7 C)  TempSrc: Oral  SpO2: 92%  Weight: 278 lb 12.8 oz (126.5 kg)  Height: 5' (1.524 m)    Assessment & Plan:   See Encounters Tab for problem based charting.  Patient seen with Dr. Rebeca Alert

## 2018-02-09 NOTE — Assessment & Plan Note (Signed)
Stable, although her GFR has dropped slightly below 30 to 27, Cr 1.92 during her clinic visit one week ago. Her bumex was recently increased to 1mg  qd. She states she has had decreased swelling, denies SOB except when walking not increased from her baseline symptoms. No signs of fluid overload or being dry on physical exam.   - decrease her metformin and check BMP again in two weeks.

## 2018-02-14 ENCOUNTER — Other Ambulatory Visit: Payer: Self-pay | Admitting: Pharmacist

## 2018-02-14 DIAGNOSIS — Z794 Long term (current) use of insulin: Principal | ICD-10-CM

## 2018-02-14 DIAGNOSIS — E1169 Type 2 diabetes mellitus with other specified complication: Secondary | ICD-10-CM

## 2018-02-14 MED ORDER — ATORVASTATIN CALCIUM 40 MG PO TABS: 40.0000 mg | ORAL_TABLET | Freq: Every day | ORAL | 3 refills | Status: DC

## 2018-02-14 NOTE — Progress Notes (Signed)
Switch from rosuvastatin to atorvastatin per Dr. Dareen Piano, renal adjustment.

## 2018-02-16 NOTE — Progress Notes (Signed)
Internal Medicine Clinic Attending  Case seen with Dr. Sharon Seller. We reviewed the resident's history and exam and pertinent patient test results. I personally reviewed the CGM data & the resident's interpretation. I agree with the assessment, diagnosis, and plan of care documented in the resident's note.

## 2018-02-21 ENCOUNTER — Encounter (INDEPENDENT_AMBULATORY_CARE_PROVIDER_SITE_OTHER): Payer: Medicare HMO | Admitting: Ophthalmology

## 2018-02-21 DIAGNOSIS — E113313 Type 2 diabetes mellitus with moderate nonproliferative diabetic retinopathy with macular edema, bilateral: Secondary | ICD-10-CM | POA: Diagnosis not present

## 2018-02-21 DIAGNOSIS — I10 Essential (primary) hypertension: Secondary | ICD-10-CM

## 2018-02-21 DIAGNOSIS — E11311 Type 2 diabetes mellitus with unspecified diabetic retinopathy with macular edema: Secondary | ICD-10-CM | POA: Diagnosis not present

## 2018-02-21 DIAGNOSIS — H43813 Vitreous degeneration, bilateral: Secondary | ICD-10-CM | POA: Diagnosis not present

## 2018-02-21 DIAGNOSIS — H33302 Unspecified retinal break, left eye: Secondary | ICD-10-CM

## 2018-02-21 DIAGNOSIS — H35033 Hypertensive retinopathy, bilateral: Secondary | ICD-10-CM

## 2018-02-23 ENCOUNTER — Encounter: Payer: Self-pay | Admitting: Internal Medicine

## 2018-02-23 ENCOUNTER — Other Ambulatory Visit: Payer: Self-pay

## 2018-02-23 ENCOUNTER — Ambulatory Visit (INDEPENDENT_AMBULATORY_CARE_PROVIDER_SITE_OTHER): Payer: Medicare HMO | Admitting: Internal Medicine

## 2018-02-23 VITALS — BP 126/56 | HR 49 | Temp 97.9°F | Ht 60.0 in | Wt 281.4 lb

## 2018-02-23 DIAGNOSIS — N183 Chronic kidney disease, stage 3 unspecified: Secondary | ICD-10-CM

## 2018-02-23 DIAGNOSIS — Z794 Long term (current) use of insulin: Secondary | ICD-10-CM

## 2018-02-23 DIAGNOSIS — E1122 Type 2 diabetes mellitus with diabetic chronic kidney disease: Secondary | ICD-10-CM | POA: Diagnosis not present

## 2018-02-23 DIAGNOSIS — E1169 Type 2 diabetes mellitus with other specified complication: Secondary | ICD-10-CM

## 2018-02-23 LAB — POCT GLYCOSYLATED HEMOGLOBIN (HGB A1C): Hemoglobin A1C: 7.5 % — AB (ref 4.0–5.6)

## 2018-02-23 LAB — GLUCOSE, CAPILLARY: GLUCOSE-CAPILLARY: 84 mg/dL (ref 70–99)

## 2018-02-23 MED ORDER — INSULIN DETEMIR 100 UNIT/ML ~~LOC~~ SOLN: SUBCUTANEOUS | 5 refills | Status: DC

## 2018-02-23 NOTE — Patient Instructions (Addendum)
Kathleen [name], It was great meeting you! Your blood sugars are looking great! Keep up the good work. Your A1C has improved to 7.5.  We will decrease your Levemir to 65 units daily. Continue on your same doses of Victoza and Metformin.   You have an appointment scheduled with Dr. Dareen Piano coming up on 11/12.   I will let you know the results of your kidney function when I have those results.  Please call if you develop any symptoms of low blood sugar (lightheadedness, sweatiness, weakness) or if your blood sugars are consistently staying elevated.  Otherwise, we'll see you back in a couple of weeks!   Take care, Dr. Koleen Distance

## 2018-02-23 NOTE — Progress Notes (Signed)
   CC: DM   HPI:  [name] is a 67 y.o. female with PMHx listed below who presents for follow-up on DM. Patient notes her CBGs have significantly improved since increasing Victoza to 1.8 mg at last her visit 2 weeks ago. She denies any hypoglycemic events and overall is feeling much better since having better glycemic control.   Past Medical History:  Diagnosis Date  . Atrial flutter ([place]) 04/06/2016  . Bradycardia 04/06/2016   type 1 second degree AV block  . Chronic diastolic (congestive) heart failure ([place]) 01/10/2015  . Degenerative joint disease of hand   . Diabetes mellitus   . Dyslipidemia   . Fecal occult blood test positive   . GERD (gastroesophageal reflux disease)   . Headache   . Hypertension   . Inadequate material resources   . Irritable bowel syndrome   . Obesity   . Post-menopausal bleeding   . Shortness of breath dyspnea    multifactorial from obesity, deconditioning, obesity hypoventilation syndrome   Review of Systems:   Constitutional: negative for lightheadedness, syncope Cardio: negative for chest pain, shortness of breath, orthopnea, increased lower extremity swelling GI: negative for abdominal pain, n/v, changes in bowel movements GU: negative for decreased urine output or dysuria   Physical Exam:  Vitals:   02/23/18 1057  BP: (!) 126/56  Pulse: (!) 49  Temp: 97.9 F (36.6 C)  TempSrc: Oral  SpO2: 93%  Weight: 281 lb 6.4 oz (127.6 kg)  Height: 5' (1.524 m)   General: alert, pleasant female, appears stated age in NAD CV: bradycardic rate with regular rhythm; no murmurs, rubs or gallops Pulm: normal respiratory effort; lungs CTA bilaterally Ext: 1+ pitting edema bilaterally    Assessment & Plan:   See Encounters Tab for problem based charting.  Patient seen with Dr. Lynnae Kathleen [name]

## 2018-02-23 NOTE — Progress Notes (Signed)
Internal Medicine Clinic Attending  I saw and evaluated the patient.  I personally confirmed the key portions of the history and exam documented by Dr. Bloomfield and I reviewed pertinent patient test results.  The assessment, diagnosis, and plan were formulated together and I agree with the documentation in the resident's note.  

## 2018-02-23 NOTE — Assessment & Plan Note (Signed)
Metformin decreased to 500 mg BID at visit 2 weeks ago due to GFR being consistently < 30. Will repeat BMET today and decrease or discontinue Metformin if indicated.

## 2018-02-23 NOTE — Assessment & Plan Note (Signed)
Patient has good glycemic control with increased dose of Victoza. She is within goal range 90% of the time. She has not had any hypoglycemia. Will decrease Levemir by 10% today (down to 65 units) and continue to monitor. She has PCP follow-up in 2 weeks.  Continue Metformin 500 mg BID unless BMET shows worsening renal function.

## 2018-02-24 LAB — BMP8+ANION GAP
Anion Gap: 16 mmol/L (ref 10.0–18.0)
BUN/Creatinine Ratio: 19 (ref 12–28)
BUN: 31 mg/dL — ABNORMAL HIGH (ref 8–27)
CO2: 25 mmol/L (ref 20–29)
CREATININE: 1.61 mg/dL — AB (ref 0.57–1.00)
Calcium: 9.5 mg/dL (ref 8.7–10.3)
Chloride: 102 mmol/L (ref 96–106)
GFR calc Af Amer: 38 mL/min/{1.73_m2} — ABNORMAL LOW (ref >59)
GFR, EST NON AFRICAN AMERICAN: 33 mL/min/{1.73_m2} — AB (ref >59)
Glucose: 91 mg/dL (ref 65–99)
Potassium: 4.6 mmol/L (ref 3.5–5.2)
Sodium: 143 mmol/L (ref 134–144)

## 2018-03-08 ENCOUNTER — Ambulatory Visit (INDEPENDENT_AMBULATORY_CARE_PROVIDER_SITE_OTHER): Payer: 59 | Admitting: Internal Medicine

## 2018-03-08 ENCOUNTER — Encounter: Payer: Self-pay | Admitting: Internal Medicine

## 2018-03-08 VITALS — BP 123/62 | HR 51 | Temp 97.8°F | Ht 60.0 in | Wt 281.2 lb

## 2018-03-08 DIAGNOSIS — Z794 Long term (current) use of insulin: Secondary | ICD-10-CM

## 2018-03-08 DIAGNOSIS — E1142 Type 2 diabetes mellitus with diabetic polyneuropathy: Secondary | ICD-10-CM | POA: Diagnosis not present

## 2018-03-08 DIAGNOSIS — Z6841 Body Mass Index (BMI) 40.0 and over, adult: Secondary | ICD-10-CM

## 2018-03-08 DIAGNOSIS — N183 Chronic kidney disease, stage 3 unspecified: Secondary | ICD-10-CM

## 2018-03-08 DIAGNOSIS — E1122 Type 2 diabetes mellitus with diabetic chronic kidney disease: Secondary | ICD-10-CM

## 2018-03-08 DIAGNOSIS — R42 Dizziness and giddiness: Secondary | ICD-10-CM

## 2018-03-08 DIAGNOSIS — I10 Essential (primary) hypertension: Secondary | ICD-10-CM

## 2018-03-08 DIAGNOSIS — J9611 Chronic respiratory failure with hypoxia: Secondary | ICD-10-CM

## 2018-03-08 DIAGNOSIS — Z7901 Long term (current) use of anticoagulants: Secondary | ICD-10-CM

## 2018-03-08 DIAGNOSIS — Z79899 Other long term (current) drug therapy: Secondary | ICD-10-CM

## 2018-03-08 DIAGNOSIS — I5032 Chronic diastolic (congestive) heart failure: Secondary | ICD-10-CM | POA: Diagnosis not present

## 2018-03-08 DIAGNOSIS — I4892 Unspecified atrial flutter: Secondary | ICD-10-CM | POA: Diagnosis not present

## 2018-03-08 DIAGNOSIS — I509 Heart failure, unspecified: Secondary | ICD-10-CM

## 2018-03-08 DIAGNOSIS — I130 Hypertensive heart and chronic kidney disease with heart failure and stage 1 through stage 4 chronic kidney disease, or unspecified chronic kidney disease: Secondary | ICD-10-CM | POA: Diagnosis not present

## 2018-03-08 DIAGNOSIS — Z9981 Dependence on supplemental oxygen: Secondary | ICD-10-CM

## 2018-03-08 DIAGNOSIS — E1169 Type 2 diabetes mellitus with other specified complication: Secondary | ICD-10-CM

## 2018-03-08 DIAGNOSIS — E669 Obesity, unspecified: Secondary | ICD-10-CM

## 2018-03-08 DIAGNOSIS — G6289 Other specified polyneuropathies: Secondary | ICD-10-CM

## 2018-03-08 NOTE — Assessment & Plan Note (Signed)
-  This problem is chronic and stable -Patient denies any shortness of breath and states that her lower extremity edema has improved -She is compliant with Bumex 1 mg daily -She will follow-up with cardiology in January -No further work-up at this time

## 2018-03-08 NOTE — Assessment & Plan Note (Signed)
-  Patient has a history of chronic hypoxic respiratory failure -This problem is chronic and stable -Patient was noted to have decreased O2 sats while ambulating today down to the 80s.  Patient normally ambulates with O2 (2 L via nasal cannula).  On 2 L nasal cannula her O2 sats were in the 90s -Patient encouraged to use O2 while ambulating -No further work-up at this time

## 2018-03-08 NOTE — Assessment & Plan Note (Signed)
-  This problem is chronic and stable -Patient appears to be in a regular rhythm on exam today -Patient is compliant with apixaban for a coagulation -No further work-up at this time

## 2018-03-08 NOTE — Assessment & Plan Note (Signed)
-  This problem is chronic and stable -Patient's creatinine was 1.6 on her last visit in October -We will recheck her BMP on her follow-up visit -No further work-up at this time

## 2018-03-08 NOTE — Assessment & Plan Note (Signed)
-  Patient has been exercising daily but her weight is up to 281 pounds -Patient was down to 261 pounds earlier this year -We discussed the importance of following a good diet as well as exercising daily especially with the holidays coming up -Patient expresses understanding and will try and lose weight -We will follow-up at her next visit

## 2018-03-08 NOTE — Assessment & Plan Note (Signed)
-  This problem is chronic and stable -Patient symptoms are well controlled on gabapentin as well as 12.5 mg of amitriptyline -Her blood sugars are now better controlled.  Patient will do a trial off amitriptyline to see if she still needs this medication or not.  If she does get recurrent symptoms she can restart her amitriptyline at 12.5 mg daily -We will continue to monitor her closely

## 2018-03-08 NOTE — Assessment & Plan Note (Signed)
BP Readings from Last 3 Encounters:  03/08/18 123/62  02/23/18 (!) 126/56  02/09/18 (!) 132/57    Lab Results  Component Value Date   NA 143 02/23/2018   K 4.6 02/23/2018   CREATININE 1.61 (H) 02/23/2018    Assessment: Blood pressure control:  Well-controlled Progress toward BP goal:   At goal Comments: Patient is compliant with Bumex 1 mg daily  Plan: Medications:  continue current medications Educational resources provided:   Self management tools provided:   Other plans: We will check BMP on follow-up visit

## 2018-03-08 NOTE — Patient Instructions (Signed)
-  Was a pleasure seeing you today -Please wear your oxygen when you are ambulating -Your blood pressure is doing well.  Keep up the great work -Please continue to watch your weight and exercise daily. -Try and come off the amitriptyline and see if your symptoms have resolved.  If they continue please resume the amitriptyline. -Enjoy the holiday season!  Please follow-up with me in 3 months

## 2018-03-08 NOTE — Assessment & Plan Note (Signed)
-  Patient was recently seen in [facility] for this -She has good glycemic control with her increased dose of Victoza -Her Levemir was decreased to 65 units at night -We will continue with metformin 500 mg twice daily -We will recheck her A1c on follow-up

## 2018-03-08 NOTE — Progress Notes (Signed)
   Subjective:    Patient ID: Kathleen [name], female    DOB: 1950/05/28, 67 y.o.   MRN: [id number]  HPI  I have seen and examined this patient.  Patient is here for routine follow-up of her hypertension and congestive heart failure.  Patient states that she feels well today and has no new complaints.  She was noted to have decreased O2 levels while ambulating (down to the 80s) and states that she normally uses her oxygen while ambulating but forgot this at home today.  Her oxygen levels improved to the 90s on her home 2 L.  She denies any shortness of breath. She states that she is compliant with all her medications.   Review of Systems  Constitutional: Negative.   HENT: Negative.   Respiratory: Negative.   Cardiovascular: Negative.   Gastrointestinal: Negative.   Musculoskeletal: Negative.   Neurological: Positive for light-headedness.  Psychiatric/Behavioral: Negative.        Objective:   Physical Exam  Constitutional: She is oriented to person, place, and time. She appears well-developed and well-nourished.  HENT:  Head: Normocephalic and atraumatic.  Mouth/Throat: Oropharynx is clear and moist. No oropharyngeal exudate.  Neck: Neck supple.  Cardiovascular: Normal rate and regular rhythm.  Pulmonary/Chest: Effort normal and breath sounds normal. She has no wheezes. She has no rales.  Abdominal: Soft. Bowel sounds are normal. She exhibits no distension. There is no tenderness.  Musculoskeletal: Normal range of motion. She exhibits edema.  Trace bilateral lower extremity pitting edema noted  Lymphadenopathy:    She has no cervical adenopathy.  Neurological: She is alert and oriented to person, place, and time.  Psychiatric: She has a normal mood and affect. Her behavior is normal.          Assessment & Plan:

## 2018-03-09 LAB — GLUCOSE, CAPILLARY: Glucose-Capillary: 107 mg/dL — ABNORMAL HIGH (ref 70–99)

## 2018-03-11 ENCOUNTER — Ambulatory Visit (INDEPENDENT_AMBULATORY_CARE_PROVIDER_SITE_OTHER): Payer: Medicare HMO | Admitting: Podiatry

## 2018-03-11 ENCOUNTER — Encounter: Payer: Self-pay | Admitting: Podiatry

## 2018-03-11 DIAGNOSIS — M79674 Pain in right toe(s): Secondary | ICD-10-CM | POA: Diagnosis not present

## 2018-03-11 DIAGNOSIS — M79675 Pain in left toe(s): Secondary | ICD-10-CM | POA: Diagnosis not present

## 2018-03-11 DIAGNOSIS — B351 Tinea unguium: Secondary | ICD-10-CM | POA: Diagnosis not present

## 2018-03-11 DIAGNOSIS — E1142 Type 2 diabetes mellitus with diabetic polyneuropathy: Secondary | ICD-10-CM

## 2018-03-28 ENCOUNTER — Encounter: Payer: Self-pay | Admitting: Podiatry

## 2018-03-28 ENCOUNTER — Encounter (INDEPENDENT_AMBULATORY_CARE_PROVIDER_SITE_OTHER): Payer: Medicare HMO | Admitting: Ophthalmology

## 2018-03-28 DIAGNOSIS — E11311 Type 2 diabetes mellitus with unspecified diabetic retinopathy with macular edema: Secondary | ICD-10-CM | POA: Diagnosis not present

## 2018-03-28 DIAGNOSIS — E113313 Type 2 diabetes mellitus with moderate nonproliferative diabetic retinopathy with macular edema, bilateral: Secondary | ICD-10-CM | POA: Diagnosis not present

## 2018-03-28 DIAGNOSIS — H35033 Hypertensive retinopathy, bilateral: Secondary | ICD-10-CM

## 2018-03-28 DIAGNOSIS — H43813 Vitreous degeneration, bilateral: Secondary | ICD-10-CM

## 2018-03-28 DIAGNOSIS — H33302 Unspecified retinal break, left eye: Secondary | ICD-10-CM | POA: Diagnosis not present

## 2018-03-28 DIAGNOSIS — I10 Essential (primary) hypertension: Secondary | ICD-10-CM | POA: Diagnosis not present

## 2018-03-28 NOTE — Progress Notes (Signed)
Subjective: Kathleen [name] presents today with painful, thick toenails 1-5 b/l that she cannot cut and which interfere with daily activities.  Pain is aggravated when wearing enclosed shoe gear.  States blood sugar was 115 mg/dl this morning. Her last HgA1c was 7.5%.  Objective: Vascular Examination: Capillary refill time less than 3 seconds x 10 Dorsalis pedis and Posterior tibial pulses 1/4 bl Digital hair x 10 digits sparse  Skin temperature gradient WNL b/l  Dermatological Examination: Skin with normal turgor, texture and tone b/l  Toenails 1-5 b/l discolored, thick, dystrophic with subungual debris and pain with palpation to nailbeds due to thickness of nails.  Musculoskeletal: Muscle strength 5/5 to all LE muscle groups  Neurological: Sensation decreased with 10 gram monofilament.  Assessment: Painful onychomycosis toenails 1-5 b/l  Diabetic neuropathy  Plan: 1. Toenails 1-5 b/l were debrided in length and girth without iatrogenic bleeding. 2. Patient to continue soft, supportive shoe gear 3. Patient to report any pedal injuries to medical professional immediately. 4. Follow up 3 months. Patient/POA to call should there be a concern in the interim.

## 2018-03-30 ENCOUNTER — Other Ambulatory Visit: Payer: Self-pay | Admitting: *Deleted

## 2018-03-30 NOTE — Patient Outreach (Signed)
[facility]) Care Management  03/30/2018  Kathleen [name] [birth year] [id number]   Covering RNCM for assigned Health Coach, [place] called to follow up. Client reports seeing her primary care on November 30th.  Client reports medication change: metformin 518m bid, victoza increased to 1.851m levemir decreased to 65 units and her statin changed to atorvastatin. She reports recent visit this week with her eye doctor. [name] a decrease in her A1C to 7.3 in November.  Plan: Health Coach to continue to follow.  THN CM Care Plan Problem One     Most Recent Value  Care Plan Problem One  Knowledge deficit in self management of diabetes  Role Documenting the Problem One  He[place]or Problem One  Active  [facility]   Patient will report following up with health maintenance appointments within the next 90days  THTruxtontart Date  02/02/18  Interventions for Problem One Long Term Goal  RNCM discussed follow up appointments with client. Provided positive feedback regarding self management of chronic condition  THN CM Short Term Goal #1   Patient will make sure she gets properly fitted support hose within the next 30 days  THN CM Short Term Goal #1 Start Date  02/02/18  [facility] #2   Patient will report a reduction of hypoglycemic events in the next 30 days  THN CM Short Term Goal #2 Met Date  01/28/18  Interventions for Short Term Goal #2  denies any hypoglycemic episodes since last encounter with Health coach.  THN CM Short Term Goal #3  patient will report looking into other weight loss measures within the next 30 days  THN CM Short Term Goal #3 Met Date  03/30/18  Interventions for Short Tern Goal #3  RNCM discussed the benefits of increased activity on muscle strength/tone and blood sugars.     Covering RNCM for Health Coach FrBrass Castlele[place]: JuThea SilversmithRN, MSN, BSWinonaCoordinator Cell: [phone]

## 2018-04-11 ENCOUNTER — Ambulatory Visit: Payer: Medicare HMO | Admitting: Cardiology

## 2018-04-21 ENCOUNTER — Encounter: Payer: Self-pay | Admitting: Cardiology

## 2018-04-28 ENCOUNTER — Other Ambulatory Visit: Payer: Self-pay | Admitting: Internal Medicine

## 2018-04-28 NOTE — Telephone Encounter (Signed)
Next appt scheduled 06/07/18 with PCP.

## 2018-05-02 ENCOUNTER — Encounter (INDEPENDENT_AMBULATORY_CARE_PROVIDER_SITE_OTHER): Payer: Medicare HMO | Admitting: Ophthalmology

## 2018-05-04 ENCOUNTER — Other Ambulatory Visit: Payer: Self-pay | Admitting: Internal Medicine

## 2018-05-04 ENCOUNTER — Encounter (INDEPENDENT_AMBULATORY_CARE_PROVIDER_SITE_OTHER): Payer: Medicare HMO | Admitting: Ophthalmology

## 2018-05-04 DIAGNOSIS — H43813 Vitreous degeneration, bilateral: Secondary | ICD-10-CM | POA: Diagnosis not present

## 2018-05-04 DIAGNOSIS — I10 Essential (primary) hypertension: Secondary | ICD-10-CM | POA: Diagnosis not present

## 2018-05-04 DIAGNOSIS — E113313 Type 2 diabetes mellitus with moderate nonproliferative diabetic retinopathy with macular edema, bilateral: Secondary | ICD-10-CM | POA: Diagnosis not present

## 2018-05-04 DIAGNOSIS — E11311 Type 2 diabetes mellitus with unspecified diabetic retinopathy with macular edema: Secondary | ICD-10-CM | POA: Diagnosis not present

## 2018-05-04 DIAGNOSIS — H33302 Unspecified retinal break, left eye: Secondary | ICD-10-CM | POA: Diagnosis not present

## 2018-05-04 DIAGNOSIS — H35033 Hypertensive retinopathy, bilateral: Secondary | ICD-10-CM | POA: Diagnosis not present

## 2018-05-17 ENCOUNTER — Other Ambulatory Visit: Payer: Self-pay | Admitting: Internal Medicine

## 2018-05-19 ENCOUNTER — Ambulatory Visit: Payer: Medicare HMO | Admitting: Cardiology

## 2018-05-31 ENCOUNTER — Other Ambulatory Visit: Payer: Self-pay | Admitting: *Deleted

## 2018-06-03 ENCOUNTER — Other Ambulatory Visit: Payer: Self-pay | Admitting: *Deleted

## 2018-06-03 DIAGNOSIS — E1169 Type 2 diabetes mellitus with other specified complication: Secondary | ICD-10-CM

## 2018-06-03 MED ORDER — "INSULIN SYRINGE-NEEDLE U-100 31G X 15/64"" 1 ML MISC": 5 refills | Status: AC

## 2018-06-07 ENCOUNTER — Encounter: Payer: Self-pay | Admitting: Internal Medicine

## 2018-06-07 ENCOUNTER — Ambulatory Visit (INDEPENDENT_AMBULATORY_CARE_PROVIDER_SITE_OTHER): Payer: Medicare HMO | Admitting: Internal Medicine

## 2018-06-07 ENCOUNTER — Other Ambulatory Visit: Payer: Self-pay

## 2018-06-07 VITALS — BP 98/64 | HR 48 | Temp 97.6°F | Ht 60.0 in | Wt 294.8 lb

## 2018-06-07 DIAGNOSIS — E1122 Type 2 diabetes mellitus with diabetic chronic kidney disease: Secondary | ICD-10-CM | POA: Diagnosis not present

## 2018-06-07 DIAGNOSIS — J9611 Chronic respiratory failure with hypoxia: Secondary | ICD-10-CM

## 2018-06-07 DIAGNOSIS — Z9981 Dependence on supplemental oxygen: Secondary | ICD-10-CM

## 2018-06-07 DIAGNOSIS — G6289 Other specified polyneuropathies: Secondary | ICD-10-CM

## 2018-06-07 DIAGNOSIS — E1169 Type 2 diabetes mellitus with other specified complication: Secondary | ICD-10-CM

## 2018-06-07 DIAGNOSIS — I5032 Chronic diastolic (congestive) heart failure: Secondary | ICD-10-CM

## 2018-06-07 DIAGNOSIS — E1142 Type 2 diabetes mellitus with diabetic polyneuropathy: Secondary | ICD-10-CM | POA: Diagnosis not present

## 2018-06-07 DIAGNOSIS — Z0000 Encounter for general adult medical examination without abnormal findings: Secondary | ICD-10-CM

## 2018-06-07 DIAGNOSIS — Z7901 Long term (current) use of anticoagulants: Secondary | ICD-10-CM

## 2018-06-07 DIAGNOSIS — N183 Chronic kidney disease, stage 3 unspecified: Secondary | ICD-10-CM

## 2018-06-07 DIAGNOSIS — Z23 Encounter for immunization: Secondary | ICD-10-CM

## 2018-06-07 DIAGNOSIS — E785 Hyperlipidemia, unspecified: Secondary | ICD-10-CM | POA: Diagnosis not present

## 2018-06-07 DIAGNOSIS — I4892 Unspecified atrial flutter: Secondary | ICD-10-CM | POA: Diagnosis not present

## 2018-06-07 DIAGNOSIS — I10 Essential (primary) hypertension: Secondary | ICD-10-CM

## 2018-06-07 DIAGNOSIS — Z794 Long term (current) use of insulin: Secondary | ICD-10-CM

## 2018-06-07 DIAGNOSIS — I130 Hypertensive heart and chronic kidney disease with heart failure and stage 1 through stage 4 chronic kidney disease, or unspecified chronic kidney disease: Secondary | ICD-10-CM

## 2018-06-07 DIAGNOSIS — Z6841 Body Mass Index (BMI) 40.0 and over, adult: Secondary | ICD-10-CM

## 2018-06-07 DIAGNOSIS — E669 Obesity, unspecified: Secondary | ICD-10-CM

## 2018-06-07 DIAGNOSIS — Z79899 Other long term (current) drug therapy: Secondary | ICD-10-CM

## 2018-06-07 LAB — POCT GLYCOSYLATED HEMOGLOBIN (HGB A1C): Hemoglobin A1C: 7 % — AB (ref 4.0–5.6)

## 2018-06-07 LAB — GLUCOSE, CAPILLARY: Glucose-Capillary: 119 mg/dL — ABNORMAL HIGH (ref 70–99)

## 2018-06-07 MED ORDER — APIXABAN 5 MG PO TABS: 5.0000 mg | ORAL_TABLET | Freq: Two times a day (BID) | ORAL | 2 refills | Status: AC

## 2018-06-07 MED ORDER — BUMETANIDE 1 MG PO TABS: 1.0000 mg | ORAL_TABLET | Freq: Two times a day (BID) | ORAL | 1 refills | Status: DC

## 2018-06-07 NOTE — Assessment & Plan Note (Addendum)
-  Patient complains of worsening lower extremity edema as well as increased dyspnea on exertion.  She denies any PND or orthopnea.  Patient also denies any cough currently (when she did have a viral URI last month she had cough productive of yellowish phlegm).  No fevers or chills noted.  Patient states that her symptoms started after her recent viral URI -On exam she was noted to have 1-2+ bilateral lower extremity pitting edema extending up to the shins.  Her lungs are clear to auscultation with no wheezes or crackles -Patient's weight is up approximately 13 pounds since her last visit -We will increase her Bumex to 1 mg twice daily for now -Patient instructed to check her weight daily and to call us if her weight is worsening or her shortness of breath is increasing -Patient to follow-up with cardiology in 10 days -She will likely need a repeat BMP at the cardiologist office given her increased dose of Bumex -No further work-up at this time

## 2018-06-07 NOTE — Assessment & Plan Note (Signed)
-  This problem is chronic and stable -We will recheck a BMP today -Patient will go up on her Bumex to 1 mg twice daily for now.  She will likely need repeat BMP when she follows up with the cardiologist office in 10 days -No further work-up at this time

## 2018-06-07 NOTE — Assessment & Plan Note (Addendum)
-  This problem is chronic and stable -Continue with Lipitor 40 mg daily for now -We will check a lipid panel today -No further work-up at this time

## 2018-06-07 NOTE — Assessment & Plan Note (Signed)
Lab Results  Component Value Date   HGBA1C 7.0 (A) 06/07/2018   HGBA1C 7.5 (A) 02/23/2018   HGBA1C 7.7 (A) 12/07/2017     Assessment: Diabetes control:  Well-controlled Progress toward A1C goal:   At goal Comments: Patient is compliant with Levemir 65 units at night as well as metformin 500 mg twice daily and Victoza 1.8 mg daily.    Plan: Medications:  continue current medications Home glucose monitoring: Frequency:   Timing:   Instruction/counseling given: reminded to get eye exam and reminded to bring blood glucose meter & log to each visit Educational resources provided:   Self management tools provided:   Other plans: We will check BMP today.  Patient blood sugars were noted to range from 73-175 on her glucometer with an average of 118.  We will continue with her current regimen for now

## 2018-06-07 NOTE — Progress Notes (Signed)
   Subjective:    Patient ID: Kathleen [name], female    DOB: 06-17-1950, 68 y.o.   MRN: [id number]  HPI  I have seen and examined this patient.  Patient is here for routine follow-up of her chronic diastolic heart failure as well as her diabetes.  Patient states that in December she was not feeling well for 2 weeks and had what she thought was the flu.  She states that she feels much better from that now but has noted increasing weight over the last 2 weeks and tightness in her legs consistent with her CHF exacerbations.  She also states that she has increased dyspnea on exertion and has had to use her oxygen while ambulating as well as at night.  She denies any other complaints at this time and states that she is compliant with all her medications.  Review of Systems  Constitutional: Negative.   HENT: Negative.   Respiratory: Positive for shortness of breath. Negative for cough and wheezing.   Cardiovascular: Positive for leg swelling. Negative for chest pain and palpitations.  Gastrointestinal: Negative.   Musculoskeletal: Negative.   Neurological: Negative.   Psychiatric/Behavioral: Negative.        Objective:   Physical Exam Constitutional:      Appearance: Normal appearance.  HENT:     Head: Normocephalic and atraumatic.     Mouth/Throat:     Mouth: Mucous membranes are moist.     Pharynx: Oropharynx is clear. No oropharyngeal exudate.  Neck:     Musculoskeletal: Neck supple.  Cardiovascular:     Rate and Rhythm: Normal rate and regular rhythm.     Heart sounds: Normal heart sounds.  Pulmonary:     Effort: Pulmonary effort is normal.     Breath sounds: Normal breath sounds. No wheezing or rales.  Abdominal:     General: Bowel sounds are normal. There is no distension.     Palpations: Abdomen is soft.     Tenderness: There is no abdominal tenderness.  Musculoskeletal:        General: Swelling present.     Comments: Bilateral 1-2+ lower extremity pitting edema noted   Lymphadenopathy:     Cervical: No cervical adenopathy.  Neurological:     General: No focal deficit present.     Mental Status: She is alert and oriented to person, place, and time. Mental status is at baseline.  Psychiatric:        Mood and Affect: Mood normal.        Behavior: Behavior normal.           Assessment & Plan:  Please see problem based charting for assessment and plan:

## 2018-06-07 NOTE — Assessment & Plan Note (Signed)
-  Patient noted to have a 13 pound weight gain since her last visit -I suspect that most of this is secondary to fluid weight from her chronic diastolic heart failure -We will follow-up with her weight after she is diuresed -Patient continues to diet and attempt exercise -No further work-up at this time

## 2018-06-07 NOTE — Assessment & Plan Note (Signed)
-  Patient has a history of chronic hypoxic respiratory failure -She does require O2 while ambulating as well as at night -She states that her dyspnea on exertion is worsened slightly over the last month but I suspect that this is likely secondary to worsening heart failure -We will continue with O2 via nasal cannula for now.  Patient with O2 sats at 97% on 2 L nasal cannula.  However, on room air her O2 sats were noted to be 78 to 90% -We will continue to monitor closely -No further work-up at this time

## 2018-06-07 NOTE — Assessment & Plan Note (Signed)
-  23 Valent pneumonia vaccine given today

## 2018-06-07 NOTE — Assessment & Plan Note (Signed)
-  This problem is chronic and stable -Continue with gabapentin for peripheral neuropathy.  Patient states that she is stopped taking the Elavil regularly and only takes it as needed.  Since her last visit she has had to take it only once. -We will continue with current regimen for now -No further work-up at this time

## 2018-06-07 NOTE — Assessment & Plan Note (Signed)
BP Readings from Last 3 Encounters:  06/07/18 98/64  03/08/18 123/62  02/23/18 (!) 126/56    Lab Results  Component Value Date   NA 143 02/23/2018   K 4.6 02/23/2018   CREATININE 1.61 (H) 02/23/2018    Assessment: Blood pressure control:  Well controlled Progress toward BP goal:   At goal Comments: Patient systolic blood pressures in the 90s today.  Will need to monitor this closely.  She denies any lightheadedness or syncope.  Patient is compliant with Bumex 1 mg daily  Plan: Medications:  continue current medications Educational resources provided:   Self management tools provided:   Other plans: We will check BMP today

## 2018-06-07 NOTE — Patient Instructions (Addendum)
-  Was a pleasure seeing you today -Your weight is up 13 pounds.  I suspect is secondary to an increase in your fluid weight.  We will increase your Bumex to 1 mg twice a day -We will check your blood work today -We will give you a pneumonia shot today -Please follow-up with the cardiologist in 1 week -You will likely need repeat blood work in 1 week at the cardiologist office -If you start noticing that your shortness of breath is worsening or your weight is increasing please give our office a call for more urgent follow-up -Please call me if you need any refills or they have any further questions

## 2018-06-07 NOTE — Assessment & Plan Note (Signed)
-  This problem is chronic and stable -Patient appears to be in regular rhythm on exam today -Continue with apixaban for anticoagulation -No further work-up at this time

## 2018-06-08 ENCOUNTER — Telehealth: Payer: Self-pay | Admitting: Internal Medicine

## 2018-06-08 ENCOUNTER — Encounter (INDEPENDENT_AMBULATORY_CARE_PROVIDER_SITE_OTHER): Payer: Medicare HMO | Admitting: Ophthalmology

## 2018-06-08 ENCOUNTER — Encounter: Payer: Self-pay | Admitting: Internal Medicine

## 2018-06-08 DIAGNOSIS — E113313 Type 2 diabetes mellitus with moderate nonproliferative diabetic retinopathy with macular edema, bilateral: Secondary | ICD-10-CM

## 2018-06-08 DIAGNOSIS — H43813 Vitreous degeneration, bilateral: Secondary | ICD-10-CM | POA: Diagnosis not present

## 2018-06-08 DIAGNOSIS — H33302 Unspecified retinal break, left eye: Secondary | ICD-10-CM | POA: Diagnosis not present

## 2018-06-08 DIAGNOSIS — H35033 Hypertensive retinopathy, bilateral: Secondary | ICD-10-CM

## 2018-06-08 DIAGNOSIS — E11311 Type 2 diabetes mellitus with unspecified diabetic retinopathy with macular edema: Secondary | ICD-10-CM

## 2018-06-08 DIAGNOSIS — I10 Essential (primary) hypertension: Secondary | ICD-10-CM

## 2018-06-08 LAB — LIPID PANEL
CHOLESTEROL TOTAL: 66 mg/dL — AB (ref 100–199)
Chol/HDL Ratio: 2.2 ratio (ref 0.0–4.4)
HDL: 30 mg/dL — ABNORMAL LOW (ref >39)
LDL Calculated: 24 mg/dL (ref 0–99)
Triglycerides: 60 mg/dL (ref 0–149)
VLDL Cholesterol Cal: 12 mg/dL (ref 5–40)

## 2018-06-08 LAB — BMP8+ANION GAP
Anion Gap: 19 mmol/L — ABNORMAL HIGH (ref 10.0–18.0)
BUN / CREAT RATIO: 23 (ref 12–28)
BUN: 44 mg/dL — ABNORMAL HIGH (ref 8–27)
CHLORIDE: 105 mmol/L (ref 96–106)
CO2: 22 mmol/L (ref 20–29)
Calcium: 9.6 mg/dL (ref 8.7–10.3)
Creatinine, Ser: 1.89 mg/dL — ABNORMAL HIGH (ref 0.57–1.00)
GFR calc non Af Amer: 27 mL/min/{1.73_m2} — ABNORMAL LOW (ref >59)
GFR, EST AFRICAN AMERICAN: 31 mL/min/{1.73_m2} — AB (ref >59)
Glucose: 115 mg/dL — ABNORMAL HIGH (ref 65–99)
Potassium: 4.3 mmol/L (ref 3.5–5.2)
Sodium: 146 mmol/L — ABNORMAL HIGH (ref 134–144)

## 2018-06-08 LAB — HM DIABETES EYE EXAM

## 2018-06-08 NOTE — Telephone Encounter (Signed)
I attempted to call the patient today to discuss the results of her blood work with her.  I, unfortunately was not able to get in touch with her.  Her creatinine on her BMP was 1.89.  Her baseline creatinine ranges from 1.6-1.9.  Her Bumex dose was increased at her visit to 1 mg twice daily given her lower extremity edema as well as her 13 pound weight increase.  Patient to follow-up with her cardiologist in 10 days and get a repeat BMP at that time and decide on what dose of Bumex to continue.  Patient to call us if her signs or symptoms of heart failure worsen.  This is discussed with the patient on her visit yesterday.  I will send the patient a letter with these results.

## 2018-06-09 ENCOUNTER — Encounter: Payer: Self-pay | Admitting: *Deleted

## 2018-06-13 ENCOUNTER — Encounter: Payer: Self-pay | Admitting: Podiatry

## 2018-06-13 ENCOUNTER — Ambulatory Visit (INDEPENDENT_AMBULATORY_CARE_PROVIDER_SITE_OTHER): Payer: Medicare HMO | Admitting: Podiatry

## 2018-06-13 DIAGNOSIS — E1142 Type 2 diabetes mellitus with diabetic polyneuropathy: Secondary | ICD-10-CM

## 2018-06-13 DIAGNOSIS — B351 Tinea unguium: Secondary | ICD-10-CM | POA: Diagnosis not present

## 2018-06-13 DIAGNOSIS — M79675 Pain in left toe(s): Secondary | ICD-10-CM

## 2018-06-13 DIAGNOSIS — M79674 Pain in right toe(s): Secondary | ICD-10-CM

## 2018-06-13 NOTE — Progress Notes (Signed)
Subjective: Kathleen [name] presents today for preventative diabetic foot care.  She presents with painful, thick toenails 1-5 b/l that she cannot cut and which interfere with daily activities.  Pain is aggravated when wearing enclosed shoe gear.  She voices no new pedal concerns on today's visit.  Patient states blood sugar was 119 mg/dL this morning.  Her last hemoglobin A1c was 7%.  Aldine Contes, MD is her PCP. Last visit was March 08, 2018.  Current Outpatient Medications:  .  ACCU-CHEK FASTCLIX LANCETS MISC, TEST BLOOD SUGAR THREE TIMES DAILY, Disp: 306 each, Rfl: 1 .  ACCU-CHEK SOFTCLIX LANCETS lancets, TEST BLOOD SUGAR THREE TIMES DAILY, Disp: 300 each, Rfl: 1 .  amitriptyline (ELAVIL) 25 MG tablet, TAKE 1 TABLET (25 MG TOTAL) BY MOUTH AT BEDTIME., Disp: 90 tablet, Rfl: 1 .  apixaban (ELIQUIS) 5 MG TABS tablet, Take 1 tablet (5 mg total) by mouth 2 (two) times daily., Disp: 180 tablet, Rfl: 2 .  atorvastatin (LIPITOR) 40 MG tablet, Take 1 tablet (40 mg total) by mouth daily., Disp: 90 tablet, Rfl: 3 .  B-D UF III MINI PEN NEEDLES 31G X 5 MM MISC, USE ONE TIME DAILY TO INJECT VICTOZA. DX:E11.9, Disp: 100 each, Rfl: 4 .  Blood Glucose Monitoring Suppl (ACCU-CHEK AVIVA PLUS) w/Device KIT, TEST BLOOD SUGAR THREE TIMES DAILY, Disp: 1 kit, Rfl: 0 .  Blood Glucose Monitoring Suppl (ACCU-CHEK NANO SMARTVIEW) w/Device KIT, Use to check blood sugar 3 times daily. Dx E11.8, Disp: 1 kit, Rfl: 0 .  bumetanide (BUMEX) 1 MG tablet, Take 1 tablet (1 mg total) by mouth 2 (two) times daily., Disp: 180 tablet, Rfl: 1 .  gabapentin (NEURONTIN) 300 MG capsule, TAKE 1 CAPSULE EVERY MORNING, TAKE 1 CAPSULE IN THE AFTERNOON, AND TAKE 2 CAPSULES EVERY NIGHT, Disp: 360 capsule, Rfl: 3 .  glucose blood test strip, TEST BLOOD SUGAR THREE TIMES DAILY, Disp: 270 each, Rfl: 1 .  insulin detemir (LEVEMIR) 100 UNIT/ML injection, Inject 65 units total into the skin once daily., Disp: 30 mL, Rfl: 5 .  Insulin  Syringe-Needle U-100 (BD VEO INSULIN SYRINGE U/F) 31G X 15/64" 1 ML MISC, USE TO INJECT INSULIN ONE TIME A DAY, Disp: 100 each, Rfl: 5 .  liraglutide (VICTOZA) 18 MG/3ML SOPN, Inject 0.3 mLs (1.8 mg total) into the skin daily., Disp: 9 pen, Rfl: 3 .  metFORMIN (GLUCOPHAGE) 1000 MG tablet, , Disp: , Rfl:  .  Multiple Vitamins-Minerals (HEALTHY EYES PO), Take 1 tablet by mouth daily. Reported on 05/17/2015, Disp: , Rfl:  .  NON FORMULARY, Place 2 L into the nose daily. Wears with exertion and qhs, Disp: , Rfl:  .  omeprazole (PRILOSEC) 20 MG capsule, TAKE 1 CAPSULE BY MOUTH EVERY DAY (Patient not taking: Reported on 03/30/2018), Disp: 90 capsule, Rfl: 3 .  VOLTAREN 1 % GEL, APPLY 4 G TOPICALLY 4 (FOUR) TIMES DAILY. (Patient not taking: Reported on 03/30/2018), Disp: 100 g, Rfl: 2  Allergies  Allergen Reactions  . Doxycycline     REACTION: Wheals and pruritus    Objective:  Vascular Examination: Capillary refill time less than 3 seconds x 10 digits  Dorsalis pedis and Posterior tibial pulses 1/4 b/l  Digital hair sparse x 10 digits  Skin temperature gradient WNL b/l  Dermatological Examination: Skin with normal turgor, texture and tone b/l  Toenails 1-5 b/l discolored, thick, dystrophic with subungual debris and pain with palpation to nailbeds due to thickness of nails.  No open wounds bilaterally.  No  interdigital macerations noted bilaterally.  Musculoskeletal: Muscle strength 5/5 to all LE muscle groups  No gross bony deformities b/l.  No pain, crepitus or joint limitation noted with ROM.   Neurological: Sensation decreased with 10 g monofilament bilaterally.    Assessment: Painful onychomycosis toenails 1-5 b/l  NIDDM with neuropathy  Plan: 1. Toenails 1-5 b/l were debrided in length and girth without iatrogenic bleeding. 2. Patient to continue soft, supportive shoe gear 3. Patient to report any pedal injuries to medical professional immediately. 4. Follow up 3  months.  5. Patient/POA to call should there be a concern in the interim.

## 2018-06-13 NOTE — Patient Instructions (Signed)

## 2018-06-15 DIAGNOSIS — H04123 Dry eye syndrome of bilateral lacrimal glands: Secondary | ICD-10-CM | POA: Diagnosis not present

## 2018-06-15 DIAGNOSIS — H401131 Primary open-angle glaucoma, bilateral, mild stage: Secondary | ICD-10-CM | POA: Diagnosis not present

## 2018-06-16 NOTE — Patient Outreach (Signed)
[facility]) Care Management  [id number] Late entry  Kathleen [name] 1951/03/13 [id number]  RN Health Coach telephone call to patient.  [place] compliance verified. Per patient she is doing better. Her fasting blood sugar is 103 this am. Per patient she received the compression hose. Patient stated she is able to wear them for a few hours then they start getting tight. RN will look and see if a large size is available. Patient stated it did help the swelling. Per patient she is monitoring her fluid intake. Patient is doing daily weights/ RN reiterated the CHF zones and action plan. Patient has agreed to follow up outreach calls.   Current Medications:  Current Outpatient Medications  Medication Sig Dispense Refill  . ACCU-CHEK FASTCLIX LANCETS MISC TEST BLOOD SUGAR THREE TIMES DAILY 306 each 1  . ACCU-CHEK SOFTCLIX LANCETS lancets TEST BLOOD SUGAR THREE TIMES DAILY 300 each 1  . amitriptyline (ELAVIL) 25 MG tablet TAKE 1 TABLET (25 MG TOTAL) BY MOUTH AT BEDTIME. 90 tablet 1  . apixaban (ELIQUIS) 5 MG TABS tablet Take 1 tablet (5 mg total) by mouth 2 (two) times daily. 180 tablet 2  . atorvastatin (LIPITOR) 40 MG tablet Take 1 tablet (40 mg total) by mouth daily. 90 tablet 3  . B-D UF III MINI PEN NEEDLES 31G X 5 MM MISC USE ONE TIME DAILY TO INJECT VICTOZA. DX:E11.9 100 each 4  . Blood Glucose Monitoring Suppl (ACCU-CHEK AVIVA PLUS) w/Device KIT TEST BLOOD SUGAR THREE TIMES DAILY 1 kit 0  . Blood Glucose Monitoring Suppl (ACCU-CHEK NANO SMARTVIEW) w/Device KIT Use to check blood sugar 3 times daily. Dx E11.8 1 kit 0  . bumetanide (BUMEX) 1 MG tablet Take 1 tablet (1 mg total) by mouth 2 (two) times daily. 180 tablet 1  . gabapentin (NEURONTIN) 300 MG capsule TAKE 1 CAPSULE EVERY MORNING, TAKE 1 CAPSULE IN THE AFTERNOON, AND TAKE 2 CAPSULES EVERY NIGHT 360 capsule 3  . glucose blood test strip TEST BLOOD SUGAR THREE TIMES DAILY 270 each 1  . insulin detemir (LEVEMIR) 100 UNIT/ML  injection Inject 65 units total into the skin once daily. 30 mL 5  . Insulin Syringe-Needle U-100 (BD VEO INSULIN SYRINGE U/F) 31G X 15/64" 1 ML MISC USE TO INJECT INSULIN ONE TIME A DAY 100 each 5  . liraglutide (VICTOZA) 18 MG/3ML SOPN Inject 0.3 mLs (1.8 mg total) into the skin daily. 9 pen 3  . metFORMIN (GLUCOPHAGE) 1000 MG tablet     . Multiple Vitamins-Minerals (HEALTHY EYES PO) Take 1 tablet by mouth daily. Reported on 05/17/2015    . NON FORMULARY Place 2 L into the nose daily. Wears with exertion and qhs    . omeprazole (PRILOSEC) 20 MG capsule TAKE 1 CAPSULE BY MOUTH EVERY DAY (Patient not taking: Reported on 03/30/2018) 90 capsule 3  . VOLTAREN 1 % GEL APPLY 4 G TOPICALLY 4 (FOUR) TIMES DAILY. (Patient not taking: Reported on 03/30/2018) 100 g 2   No current facility-administered medications for this visit.     Functional Status:  In your present state of health, do you have any difficulty performing the following activities: 06/07/2018 05/31/2018  Hearing? N N  Vision? Y Y  Comment - -  Difficulty concentrating or making decisions? N N  Walking or climbing stairs? Y Y  Dressing or bathing? N N  Doing errands, shopping? Y N  Preparing Food and eating ? - N  Using the Toilet? - N  In the past  six months, have you accidently leaked urine? - N  Do you have problems with loss of bowel control? - N  Managing your Medications? - N  Managing your Finances? - N  Housekeeping or managing your Housekeeping? - N  Some recent data might be hidden    Fall/Depression Screening: Fall Risk  06/07/2018 05/31/2018 03/08/2018  Falls in the past year? 0 0 0  Number falls in past yr: - - -  Comment - - -  Injury with Fall? - - -  Comment - - -  Risk Factor Category  - - -  Risk for fall due to : Impaired balance/gait;Impaired mobility Impaired balance/gait;Impaired mobility -  Risk for fall due to: Comment - - -  Follow up Falls evaluation completed Falls evaluation completed -   PHQ 2/9  Scores 06/07/2018 05/31/2018 03/08/2018 02/23/2018 02/09/2018 12/07/2017 11/29/2017  PHQ - 2 Score 0 0 0 0 0 0 0  PHQ- 9 Score 0 - 0 0 0 - -   THN CM Care Plan Problem One     Most Recent Value  Care Plan Problem One  Knowledge deficit in self management of diabetes  Role Documenting the Problem One  [place] for Problem One  Active  THN Long Term Goal   Patient will report following up with health maintenance appointments within the next 90days  [place] Start Date  05/31/18  Interventions for Problem One Long Term Goal  RN reiterates follow up with eye, feet , PCP and cardiology exams. RN will follow up for compliance  THN CM Short Term Goal #1   Patient will make sure she gets properly fitted support hose within the next 30 days  THN CM Short Term Goal #1 Start Date  05/31/18  Interventions for Short Term Goal #1  Patient reported receiving stocking but per patient after wearing for a few hours they start feeling tight. Patient needs a larger size. RN will check to see if available      Assessment:  Fasting blood sugar 103 Monitoring blood sugars as per ordered Patient received compression hose Unable to wear hose for only a short time due to swelling Patient  weighing daily    Plan:  RN discussed healthy eating and monitoring sodium and carbohydrates RN discussed CHF zones ans action plan Patient has PCP follow up 06/07/2018 Patient has eye exam with Dr Zigmund Daniel 06/08/2018 Patient has podiatrist appointment 02/1/72020 Patient has cardiology appointment 06/17/2018 RN will follow up within the month of May   Leah Skora [place] [facility] [phone]

## 2018-06-17 ENCOUNTER — Encounter: Payer: Self-pay | Admitting: Cardiology

## 2018-06-17 ENCOUNTER — Ambulatory Visit (INDEPENDENT_AMBULATORY_CARE_PROVIDER_SITE_OTHER): Payer: Medicare HMO | Admitting: Cardiology

## 2018-06-17 DIAGNOSIS — I4892 Unspecified atrial flutter: Secondary | ICD-10-CM | POA: Diagnosis not present

## 2018-06-17 DIAGNOSIS — I5032 Chronic diastolic (congestive) heart failure: Secondary | ICD-10-CM

## 2018-06-17 DIAGNOSIS — I10 Essential (primary) hypertension: Secondary | ICD-10-CM

## 2018-06-17 MED ORDER — TORSEMIDE 20 MG PO TABS: 20.0000 mg | ORAL_TABLET | Freq: Two times a day (BID) | ORAL | 3 refills | Status: DC

## 2018-06-17 NOTE — Patient Instructions (Signed)
Medication Instructions:  Stop: Bumex Start: Torsemide 20 mg, twice a day, by mouth  If you need a refill on your cardiac medications before your next appointment, please call your pharmacy.   Lab work: Today: BMET and CBC  2/28: BMET  If you have labs (blood work) drawn today and your tests are completely normal, you will receive your results only by: Marland Kitchen MyChart Message (if you have MyChart) OR . A paper copy in the mail If you have any lab test that is abnormal or we need to change your treatment, we will call you to review the results.  Testing/Procedures: None  Follow-Up: At [facility], you and your health needs are our priority.  As part of our continuing mission to provide you with exceptional heart care, we have created designated Provider Care Teams.  These Care Teams include your primary Cardiologist (physician) and Advanced Practice Providers (APPs -  Physician Assistants and Nurse Practitioners) who all work together to provide you with the care you need, when you need it.  Your physician recommends that you schedule a follow-up appointment in: 3 months with the PA.  You will need a follow up appointment in 6 months.  Please call our office 2 months in advance to schedule this appointment.  You may see Fransico Him, MD or one of the following Advanced Practice Providers on your designated Care Team:   [place], PA-C Melina Copa, PA-C . Ermalinda Barrios, PA-C

## 2018-06-17 NOTE — Progress Notes (Signed)
Cardiology Office Note:    Date:  06/17/2018   ID:  Kathleen [name], DOB January 12, 1951, MRN [id number]  PCP:  Kathleen Contes, MD  Cardiologist:  Kathleen Him, MD    Referring MD: Kathleen Contes, MD   Chief Complaint  Patient presents with  . Hypertension  . Hyperlipidemia  . Atrial Flutter    History of Present Illness:    Kathleen [name] is a 68 y.o. female with a hx of  Hypertension, morbid obesity, type 2 diabetes, dyslipidemia, obesity hypoventilation syndrome(on home O2 with no OSA by PSG but showed nocturnal hypoxemia), CKD stage III and chronic diastolic CHF. She also has permanent atrial flutter with CHADSVASC=3 on Eliquis 5 mg BID.   She has chronic kidney disease stage III and on higher doses of Bumex creatinine went up to 2.15 so she was taken back down to Bumex 1 mg daily.  Unfortunately then she started worsening lower extremity edema and her PCP bumped her back up to 1 mg twice daily.  She is here today for followup and is doing well.  She denies any chest pain or pressure, SOB, DOE, PND, orthopnea, dizziness, palpitations or syncope.  She still complains of significant lower extremity edema despite going up on the Bumex although she says that her abdominal fullness has definitely improved on the higher dose of the diuretic.  She is compliant with her meds and is tolerating meds with no SE.    Past Medical History:  Diagnosis Date  . Atrial flutter ([place]) 04/06/2016  . Bradycardia 04/06/2016   type 1 second degree AV block  . Chronic diastolic (congestive) heart failure ([place]) 01/10/2015  . Degenerative joint disease of hand   . Diabetes mellitus   . Dyslipidemia   . Fecal occult blood test positive   . GERD (gastroesophageal reflux disease)   . Headache   . Hypertension   . Inadequate material resources   . Irritable bowel syndrome   . Obesity   . Post-menopausal bleeding   . Shortness of breath dyspnea    multifactorial from obesity, deconditioning, obesity  hypoventilation syndrome    Past Surgical History:  Procedure Laterality Date  . CHOLECYSTECTOMY    . EYE SURGERY     left lens implant s/p cataracts  . OTHER SURGICAL HISTORY     right shoulder tendon repair 05/2011    Current Medications: Current Meds  Medication Sig  . ACCU-CHEK FASTCLIX LANCETS MISC TEST BLOOD SUGAR THREE TIMES DAILY  . ACCU-CHEK SOFTCLIX LANCETS lancets TEST BLOOD SUGAR THREE TIMES DAILY  . apixaban (ELIQUIS) 5 MG TABS tablet Take 1 tablet (5 mg total) by mouth 2 (two) times daily.  Marland Kitchen atorvastatin (LIPITOR) 40 MG tablet Take 1 tablet (40 mg total) by mouth daily.  . B-D UF III MINI PEN NEEDLES 31G X 5 MM MISC USE ONE TIME DAILY TO INJECT VICTOZA. DX:E11.9  . Blood Glucose Monitoring Suppl (ACCU-CHEK NANO SMARTVIEW) w/Device KIT Use to check blood sugar 3 times daily. Dx E11.8  . bumetanide (BUMEX) 1 MG tablet Take 1 tablet (1 mg total) by mouth 2 (two) times daily.  Marland Kitchen gabapentin (NEURONTIN) 300 MG capsule TAKE 1 CAPSULE EVERY MORNING, TAKE 1 CAPSULE IN THE AFTERNOON, AND TAKE 2 CAPSULES EVERY NIGHT  . glucose blood test strip TEST BLOOD SUGAR THREE TIMES DAILY  . insulin detemir (LEVEMIR) 100 UNIT/ML injection Inject 65 units total into the skin once daily.  . Insulin Syringe-Needle U-100 (BD VEO INSULIN SYRINGE U/F) 31G X 15/64"  1 ML MISC USE TO INJECT INSULIN ONE TIME A DAY  . liraglutide (VICTOZA) 18 MG/3ML SOPN Inject 0.3 mLs (1.8 mg total) into the skin daily.  . metFORMIN (GLUCOPHAGE) 1000 MG tablet   . Multiple Vitamins-Minerals (HEALTHY EYES PO) Take 1 tablet by mouth daily. Reported on 05/17/2015  . NON FORMULARY Place 2 L into the nose daily. Wears with exertion and qhs     Allergies:   Doxycycline   Social History   Socioeconomic History  . Marital status: Divorced    Spouse name: Not on file  . Number of children: 2  . Years of education: Not on file  . Highest education level: Not on file  Occupational History  . Occupation: unemployed     Fish farm manager: UNEMPLOYED  Social Needs  . Financial resource strain: Not on file  . Food insecurity:    Worry: Not on file    Inability: Not on file  . Transportation needs:    Medical: Not on file    Non-medical: Not on file  Tobacco Use  . Smoking status: Never Smoker  . Smokeless tobacco: Never Used  Substance and Sexual Activity  . Alcohol use: No    Alcohol/week: 0.0 standard drinks  . Drug use: No  . Sexual activity: Not Currently  Lifestyle  . Physical activity:    Days per week: Not on file    Minutes per session: Not on file  . Stress: Not on file  Relationships  . Social connections:    Talks on phone: Not on file    Gets together: Not on file    Attends religious service: Not on file    Active member of club or organization: Not on file    Attends meetings of clubs or organizations: Not on file    Relationship status: Not on file  Other Topics Concern  . Not on file  Social History Narrative   Single.   Divorced x2.   Has 2 children, by two different fathers.   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 May apply for disability/SSI given her pain in her hands which has made interviewing for jobs difficult.   Never Smoked   Alcohol use-no   Drug use-no   Regular exercise-yes      Financial assistance approved for 100% discount at [facility] and has [facility] card per [employer]   02/18/2010              Family History: The patient's family history includes Diabetes in her mother and sister; Obesity in her mother; Stroke in her father. There is no history of Colon cancer.  ROS:   Please see the history of present illness.    ROS  All other systems reviewed and negative.   EKGs/Labs/Other Studies Reviewed:    The following studies were reviewed today: none  EKG:  EKG is ordered today and showed atrial flutter with variable block at 48 bpm and no ST-T wave abnormality.  Recent  Labs: 08/31/2017: ALT 7 06/07/2018: BUN 44; Creatinine, Ser 1.89; Potassium 4.3; Sodium 146   Recent Lipid Panel    Component Value Date/Time   CHOL 66 (L) 06/07/2018 1154   TRIG 60 06/07/2018 1154   HDL 30 (L) 06/07/2018 1154   CHOLHDL 2.2 06/07/2018 1154   CHOLHDL 3.5 06/07/2014 1027   VLDL 16 06/07/2014 1027   LDLCALC 24 06/07/2018 1154    Physical Exam:  VS:  BP (!) 127/55   Pulse (!) 42   Ht 5' (1.524 m)   Wt 282 lb (127.9 kg)   SpO2 91%   BMI 55.07 kg/m     Wt Readings from Last 3 Encounters:  06/17/18 282 lb (127.9 kg)  06/07/18 294 lb 12.8 oz (133.7 kg)  03/08/18 281 lb 3.2 oz (127.6 kg)     GEN:  Well nourished, well developed in no acute distress HEENT: Normal NECK: No JVD; No carotid bruits LYMPHATICS: No lymphadenopathy CARDIAC: Irregularly irregular and bradycardic, no murmurs, rubs, gallops RESPIRATORY:  Clear to auscultation without rales, wheezing or rhonchi  ABDOMEN: Soft, non-tender, non-distended MUSCULOSKELETAL: 2+ pitting edema edema; No deformity  SKIN: Warm and dry NEUROLOGIC:  Alert and oriented x 3 PSYCHIATRIC:  Normal affect   ASSESSMENT:    1. Atrial flutter, unspecified type ([place])   2. Chronic diastolic congestive heart failure ([place])   3. Essential hypertension    PLAN:    In order of problems listed above:  1.  Permanent atrial flutter -heart rate is well controlled on exam today  She has not had any bleeding complications on the apixaban.  She will continue apixaban 5 mg twice daily and I will give her refills on this..  Her creatinine was 1.89, potassium 4.3 on 06/16/2018.  I will check be met today and then again in a week after changing her diuretic.  We will also add on a CBC.  2.  Chronic diastolic CHF -her lungs are clear but she still has significant lower extremity edema 3+ bilaterally.  Her weight is stable from November and she is actually lost 12 pounds since February 11 when she was volume overloaded.  I am actually going  to try changing her Bumex to torsemide 20 mg twice daily to see if this improves her diuresis.  We will need to follow her renal function closely during this time.  I will check a bmet today and then another bmet in a week.  I have encouraged her to follow a low-sodium diet less than 2000 mg a day and keep her legs elevated when she is sitting.  Unfortunately a lot of her lower extremity edema is related to morbid obesity.  She has tried compression hose but cannot get them on.  3.  Hypertension -BP is well controlled on exam today.  She has not been taking any antihypertensive medications recently.   Medication Adjustments/Labs and Tests Ordered: Current medicines are reviewed at length with the patient today.  Concerns regarding medicines are outlined above.  No orders of the defined types were placed in this encounter.  No orders of the defined types were placed in this encounter.   Signed, Kathleen Him, MD  06/17/2018 1:21 PM    [place]

## 2018-06-19 LAB — BASIC METABOLIC PANEL
BUN/Creatinine Ratio: 24 (ref 12–28)
BUN: 45 mg/dL — ABNORMAL HIGH (ref 8–27)
CALCIUM: 9.1 mg/dL (ref 8.7–10.3)
CO2: 27 mmol/L (ref 20–29)
Chloride: 101 mmol/L (ref 96–106)
Creatinine, Ser: 1.91 mg/dL — ABNORMAL HIGH (ref 0.57–1.00)
GFR calc Af Amer: 31 mL/min/{1.73_m2} — ABNORMAL LOW (ref >59)
GFR calc non Af Amer: 27 mL/min/{1.73_m2} — ABNORMAL LOW (ref >59)
Glucose: 106 mg/dL — ABNORMAL HIGH (ref 65–99)
Potassium: 4 mmol/L (ref 3.5–5.2)
Sodium: 143 mmol/L (ref 134–144)

## 2018-06-19 LAB — CBC
Hematocrit: 37.8 % (ref 34.0–46.6)
Hemoglobin: 11.5 g/dL (ref 11.1–15.9)
MCH: 27.1 pg (ref 26.6–33.0)
MCHC: 30.4 g/dL — ABNORMAL LOW (ref 31.5–35.7)
MCV: 89 fL (ref 79–97)
PLATELETS: 135 10*3/uL — AB (ref 150–450)
RBC: 4.25 x10E6/uL (ref 3.77–5.28)
RDW: 15.3 % (ref 11.7–15.4)
WBC: 7 10*3/uL (ref 3.4–10.8)

## 2018-06-24 ENCOUNTER — Other Ambulatory Visit: Payer: Medicare HMO | Admitting: *Deleted

## 2018-06-24 DIAGNOSIS — I5032 Chronic diastolic (congestive) heart failure: Secondary | ICD-10-CM

## 2018-06-24 DIAGNOSIS — I4892 Unspecified atrial flutter: Secondary | ICD-10-CM

## 2018-06-24 LAB — BASIC METABOLIC PANEL
BUN/Creatinine Ratio: 21 (ref 12–28)
BUN: 41 mg/dL — ABNORMAL HIGH (ref 8–27)
CO2: 23 mmol/L (ref 20–29)
CREATININE: 1.99 mg/dL — AB (ref 0.57–1.00)
Calcium: 9.5 mg/dL (ref 8.7–10.3)
Chloride: 103 mmol/L (ref 96–106)
GFR calc Af Amer: 30 mL/min/{1.73_m2} — ABNORMAL LOW (ref >59)
GFR calc non Af Amer: 26 mL/min/{1.73_m2} — ABNORMAL LOW (ref >59)
Glucose: 109 mg/dL — ABNORMAL HIGH (ref 65–99)
Potassium: 4.5 mmol/L (ref 3.5–5.2)
Sodium: 143 mmol/L (ref 134–144)

## 2018-06-27 ENCOUNTER — Encounter: Payer: Self-pay | Admitting: Cardiology

## 2018-06-27 NOTE — Telephone Encounter (Signed)
See result note.  

## 2018-06-27 NOTE — Telephone Encounter (Signed)
New Message  Patient returning Ben's phone call about lab results.

## 2018-07-10 DIAGNOSIS — I509 Heart failure, unspecified: Secondary | ICD-10-CM | POA: Diagnosis not present

## 2018-07-10 DIAGNOSIS — R062 Wheezing: Secondary | ICD-10-CM | POA: Diagnosis not present

## 2018-07-10 DIAGNOSIS — I10 Essential (primary) hypertension: Secondary | ICD-10-CM | POA: Diagnosis not present

## 2018-07-11 ENCOUNTER — Other Ambulatory Visit: Payer: Self-pay | Admitting: Internal Medicine

## 2018-07-11 DIAGNOSIS — E118 Type 2 diabetes mellitus with unspecified complications: Secondary | ICD-10-CM

## 2018-07-13 ENCOUNTER — Other Ambulatory Visit: Payer: Self-pay

## 2018-07-13 ENCOUNTER — Encounter (INDEPENDENT_AMBULATORY_CARE_PROVIDER_SITE_OTHER): Payer: Medicare HMO | Admitting: Ophthalmology

## 2018-07-13 DIAGNOSIS — H33302 Unspecified retinal break, left eye: Secondary | ICD-10-CM | POA: Diagnosis not present

## 2018-07-13 DIAGNOSIS — E113313 Type 2 diabetes mellitus with moderate nonproliferative diabetic retinopathy with macular edema, bilateral: Secondary | ICD-10-CM

## 2018-07-13 DIAGNOSIS — H35033 Hypertensive retinopathy, bilateral: Secondary | ICD-10-CM

## 2018-07-13 DIAGNOSIS — E11311 Type 2 diabetes mellitus with unspecified diabetic retinopathy with macular edema: Secondary | ICD-10-CM

## 2018-07-13 DIAGNOSIS — I10 Essential (primary) hypertension: Secondary | ICD-10-CM

## 2018-07-13 DIAGNOSIS — H43813 Vitreous degeneration, bilateral: Secondary | ICD-10-CM

## 2018-07-16 ENCOUNTER — Other Ambulatory Visit: Payer: Self-pay | Admitting: Internal Medicine

## 2018-08-10 DIAGNOSIS — R062 Wheezing: Secondary | ICD-10-CM | POA: Diagnosis not present

## 2018-08-10 DIAGNOSIS — I10 Essential (primary) hypertension: Secondary | ICD-10-CM | POA: Diagnosis not present

## 2018-08-10 DIAGNOSIS — I509 Heart failure, unspecified: Secondary | ICD-10-CM | POA: Diagnosis not present

## 2018-08-18 ENCOUNTER — Other Ambulatory Visit: Payer: Self-pay

## 2018-08-18 ENCOUNTER — Telehealth: Payer: Self-pay | Admitting: *Deleted

## 2018-08-18 ENCOUNTER — Encounter (INDEPENDENT_AMBULATORY_CARE_PROVIDER_SITE_OTHER): Payer: Medicare HMO | Admitting: Ophthalmology

## 2018-08-18 DIAGNOSIS — E11311 Type 2 diabetes mellitus with unspecified diabetic retinopathy with macular edema: Secondary | ICD-10-CM | POA: Diagnosis not present

## 2018-08-18 DIAGNOSIS — E113313 Type 2 diabetes mellitus with moderate nonproliferative diabetic retinopathy with macular edema, bilateral: Secondary | ICD-10-CM

## 2018-08-18 DIAGNOSIS — H33302 Unspecified retinal break, left eye: Secondary | ICD-10-CM

## 2018-08-18 DIAGNOSIS — H35033 Hypertensive retinopathy, bilateral: Secondary | ICD-10-CM

## 2018-08-18 DIAGNOSIS — I10 Essential (primary) hypertension: Secondary | ICD-10-CM

## 2018-08-18 DIAGNOSIS — H43813 Vitreous degeneration, bilateral: Secondary | ICD-10-CM

## 2018-08-18 NOTE — Telephone Encounter (Signed)
Called pt to schedule off of recall list. Pt agrees to virtual phone visit as pt doesn't have a smart phone nor computer. Pt gave verbal consent    TELEPHONE CALL NOTE  This patient has been deemed a candidate for follow-up tele-health visit to limit community exposure during the Covid-19 pandemic. I spoke with the patient via phone to discuss instructions. This has been outlined on the patient's AVS (dotphrase: hcevisitinfo). The patient was advised to review the section on consent for treatment as well. The patient will receive a phone call 2-3 days prior to their E-Visit at which time consent will be verbally confirmed.   A Virtual Office Visit appointment type has been scheduled for Virtual visit with Melina Copa, PA-C, with "VIDEO" or "TELEPHONE" in the appointment notes - patient prefers PHONE type.  I have either confirmed the patient is active in MyChart or offered to send sign-up link to phone/email via Mychart icon beside patient's photo. Pt declined mychart, as pt has flip phone and no computer.  Gave verbal consent for virtual visit.   Kathleen [name], [place] 08/18/2018 4:08 PM     Virtual Visit Pre-Appointment Phone Call  "(Name), I am calling you today to discuss your upcoming appointment. We are currently trying to limit exposure to the virus that causes COVID-19 by seeing patients at home rather than in the office."  1. "What is the BEST phone number to call the day of the visit?" - include this in appointment notes  2. "Do you have or have access to (through a family member/friend) a smartphone with video capability that we can use for your visit?" a. If yes - list this number in appt notes as "cell" (if different from BEST phone #) and list the appointment type as a VIDEO visit in appointment notes b. If no - list the appointment type as a PHONE visit in appointment notes  3. Confirm consent - "In the setting of the current Covid19 crisis, you are scheduled for a (phone or  video) visit with your provider on (date) at (time).  Just as we do with many in-office visits, in order for you to participate in this visit, we must obtain consent.  If you'd like, I can send this to your mychart (if signed up) or email for you to review.  Otherwise, I can obtain your verbal consent now.  All virtual visits are billed to your insurance company just like a normal visit would be.  By agreeing to a virtual visit, we'd like you to understand that the technology does not allow for your provider to perform an examination, and thus may limit your provider's ability to fully assess your condition. If your provider identifies any concerns that need to be evaluated in person, we will make arrangements to do so.  Finally, though the technology is pretty good, we cannot assure that it will always work on either your or our end, and in the setting of a video visit, we may have to convert it to a phone-only visit.  In either situation, we cannot ensure that we have a secure connection.  Are you willing to proceed?" STAFF: Did the patient verbally acknowledge consent to telehealth visit? Document YES/NO here: YES  4. Advise patient to be prepared - "Two hours prior to your appointment, go ahead and check your blood pressure, pulse, oxygen saturation, and your weight (if you have the equipment to check those) and write them all down. When your visit starts, your provider will  ask you for this information. If you have an Apple Watch or Kardia device, please plan to have heart rate information ready on the day of your appointment. Please have a pen and paper handy nearby the day of the visit as well."  5. Give patient instructions for MyChart download to smartphone OR Doximity/Doxy.me as below if video visit (depending on what platform provider is using)  6. Inform patient they will receive a phone call 15 minutes prior to their appointment time (may be from unknown caller ID) so they should be prepared to  answer    [name] has been deemed a candidate for a follow-up tele-health visit to limit community exposure during the Covid-19 pandemic. I spoke with the patient via phone to ensure availability of phone/video source, confirm preferred email & phone number, and discuss instructions and expectations.  I reminded Kathleen [name] to be prepared with any vital sign and/or heart rhythm information that could potentially be obtained via home monitoring, at the time of her visit. I reminded Kathleen [name] to expect a phone call prior to her visit.  Kathleen [name], [place] 08/18/2018 4:09 PM   INSTRUCTIONS FOR DOWNLOADING THE MYCHART APP TO SMARTPHONE  - The patient must first make sure to have activated MyChart and know their login information - If Apple, go to [employer] and type in MyChart in the search bar and download the app. If Android, ask patient to go to [employer] and type in [place] in the search bar and download the app. The app is free but as with any other app downloads, their phone may require them to verify saved payment information or Apple/Android password.  - The patient will need to then log into the app with their MyChart username and password, and select [place] as their healthcare provider to link the account. When it is time for your visit, go to the MyChart app, find appointments, and click Begin Video Visit. Be sure to Select Allow for your device to access the Microphone and Camera for your visit. You will then be connected, and your provider will be with you shortly.  **If they have any issues connecting, or need assistance please contact MyChart service desk [phone])**  **If using a computer, in order to ensure the best quality for their visit they will need to use either of the following Internet Browsers: [employer], or Google Chrome**  IF USING DOXIMITY or DOXY.ME - The patient will receive a link just prior to  their visit by text.     FULL LENGTH CONSENT FOR TELE-HEALTH VISIT   I hereby voluntarily request, consent and authorize [place] and its employed or contracted physicians, physician assistants, nurse practitioners or other licensed health care professionals (the Practitioner), to provide me with telemedicine health care services (the "Services") as deemed necessary by the treating Practitioner. I acknowledge and consent to receive the Services by the Practitioner via telemedicine. I understand that the telemedicine visit will involve communicating with the Practitioner through live audiovisual communication technology and the disclosure of certain medical information by electronic transmission. I acknowledge that I have been given the opportunity to request an in-person assessment or other available alternative prior to the telemedicine visit and am voluntarily participating in the telemedicine visit.  I understand that I have the right to withhold or withdraw my consent to the use of telemedicine in the course of my care at any time, without affecting my  right to future care or treatment, and that the Practitioner or I may terminate the telemedicine visit at any time. I understand that I have the right to inspect all information obtained and/or recorded in the course of the telemedicine visit and may receive copies of available information for a reasonable fee.  I understand that some of the potential risks of receiving the Services via telemedicine include:  [place] Delay or interruption in medical evaluation due to technological equipment failure or disruption; . Information transmitted may not be sufficient (e.g. poor resolution of images) to allow for appropriate medical decision making by the Practitioner; and/or  . In rare instances, security protocols could fail, causing a breach of personal health information.  Furthermore, I acknowledge that it is my responsibility to provide information  about my medical history, conditions and care that is complete and accurate to the best of my ability. I acknowledge that Practitioner's advice, recommendations, and/or decision may be based on factors not within their control, such as incomplete or inaccurate data provided by me or distortions of diagnostic images or specimens that may result from electronic transmissions. I understand that the practice of medicine is not an exact science and that Practitioner makes no warranties or guarantees regarding treatment outcomes. I acknowledge that I will receive a copy of this consent concurrently upon execution via email to the email address I last provided but may also request a printed copy by calling the office of [place].    I understand that my insurance will be billed for this visit.   I have read or had this consent read to me. . I understand the contents of this consent, which adequately explains the benefits and risks of the Services being provided via telemedicine.  . I have been provided ample opportunity to ask questions regarding this consent and the Services and have had my questions answered to my satisfaction. . I give my informed consent for the services to be provided through the use of telemedicine in my medical care  By participating in this telemedicine visit I agree to the above.

## 2018-09-01 ENCOUNTER — Other Ambulatory Visit: Payer: Self-pay | Admitting: *Deleted

## 2018-09-01 NOTE — Patient Outreach (Signed)
[facility]) Care Management  09/01/2018   Kathleen [name] 03/03/1951 [id number]  RN Health Coach telephone call to patient.  [employer] compliance verified. Per patient her A1C is 7.0. Patient fasting blood sugar is 106. Per patient she is having difficulty finding alcohol preps and alcohol for her insulin injections.  Per patient her ankles are a little swollen but the swelling is mostly her abdomen. Patient has a scale but stated it stopped working. She put new batteries in it and still does not work. Per patient she will order one from [facility] if she cannot get it to work. RN Health Coach reiterated the importance of weighing with Congestive Heart Failure.  Patient stated that she is taking some walks but gets short of breath. RN discussed with patient that she was exercising at the gym 5 days a week and now she has decreased. She has to build her strength back up.Per patient she attempted to get out of bed and slid to the floor. Patient stated that she has bruise on her arms and knees when she attempted to get up. Patient has agreed to further outreach calls.  Current Medications:  Current Outpatient Medications  Medication Sig Dispense Refill  . ACCU-CHEK FASTCLIX LANCETS MISC TEST BLOOD SUGAR THREE TIMES DAILY 306 each 1  . ACCU-CHEK SOFTCLIX LANCETS lancets TEST BLOOD SUGAR THREE TIMES DAILY 300 each 1  . apixaban (ELIQUIS) 5 MG TABS tablet Take 1 tablet (5 mg total) by mouth 2 (two) times daily. 180 tablet 2  . atorvastatin (LIPITOR) 40 MG tablet Take 1 tablet (40 mg total) by mouth daily. 90 tablet 3  . B-D UF III MINI PEN NEEDLES 31G X 5 MM MISC USE ONE TIME DAILY TO INJECT VICTOZA. DX:E11.9 100 each 4  . Blood Glucose Monitoring Suppl (ACCU-CHEK AVIVA PLUS) w/Device KIT TEST BLOOD SUGAR THREE TIMES DAILY. diag code E11.69. insulin dependent 1 kit 0  . gabapentin (NEURONTIN) 300 MG capsule TAKE 1 CAPSULE EVERY MORNING, TAKE 1 CAPSULE IN THE AFTERNOON, AND TAKE 2 CAPSULES EVERY  NIGHT 360 capsule 3  . glucose blood test strip TEST BLOOD SUGAR THREE TIMES DAILY 270 each 1  . insulin detemir (LEVEMIR) 100 UNIT/ML injection Inject 65 units total into the skin once daily. 30 mL 5  . Insulin Syringe-Needle U-100 (BD VEO INSULIN SYRINGE U/F) 31G X 15/64" 1 ML MISC USE TO INJECT INSULIN ONE TIME A DAY 100 each 5  . liraglutide (VICTOZA) 18 MG/3ML SOPN Inject 0.3 mLs (1.8 mg total) into the skin daily. 9 pen 3  . metFORMIN (GLUCOPHAGE) 1000 MG tablet TAKE 1 TABLET (1,000 MG TOTAL) BY MOUTH 2 (TWO) TIMES DAILY WITH A MEAL. 180 tablet 1  . Multiple Vitamins-Minerals (HEALTHY EYES PO) Take 1 tablet by mouth daily. Reported on 05/17/2015    . NON FORMULARY Place 2 L into the nose daily. Wears with exertion and qhs    . torsemide (DEMADEX) 20 MG tablet Take 1 tablet (20 mg total) by mouth 2 (two) times daily. 180 tablet 3   No current facility-administered medications for this visit.     Functional Status:  In your present state of health, do you have any difficulty performing the following activities: 09/01/2018 06/07/2018  Hearing? N N  Vision? Y Y  Comment - -  Difficulty concentrating or making decisions? N N  Walking or climbing stairs? Y Y  Comment patient gait is unsteady at times -  Dressing or bathing? N N  Doing errands,  shopping? N Y  Conservation officer, nature and eating ? N -  Using the Toilet? N -  In the past six months, have you accidently leaked urine? N -  Do you have problems with loss of bowel control? N -  Managing your Medications? N -  Managing your Finances? - -  Housekeeping or managing your Housekeeping? N -  Some recent data might be hidden    Fall/Depression Screening: Fall Risk  09/01/2018 06/07/2018 05/31/2018  Falls in the past year? 1 0 0  Number falls in past yr: 1 - -  Comment - - -  Injury with Fall? 1 - -  Comment bruises - -  Risk Factor Category  - - -  Risk for fall due to : Impaired balance/gait;Impaired mobility;History of fall(s) Impaired  balance/gait;Impaired mobility Impaired balance/gait;Impaired mobility  Risk for fall due to: Comment - - -  Follow up Falls evaluation completed;Falls prevention discussed;Education provided Falls evaluation completed Falls evaluation completed   PHQ 2/9 Scores 09/01/2018 06/07/2018 05/31/2018 03/08/2018 02/23/2018 02/09/2018 12/07/2017  PHQ - 2 Score 0 0 0 0 0 0 0  PHQ- 9 Score - 0 - 0 0 0 -   THN CM Care Plan Problem One     Most Recent Value  Care Plan Problem One  Knowledge deficit in self management of diabetes  Role Documenting the Problem One  [place] for Problem One  Active  THN Long Term Goal   Patient will report following up with health maintenance appointments within the next 90days  [place] Start Date  09/01/18  Interventions for Problem One Long Term Goal  RN reiterates rescheduling any appointments cancelled due to coronavirus. RN will follow up for further discussion  [facility]N CM Short Term Goal #1   Patient will make sure she gets properly fitted support hose within the next 30 days  [facility] CM Short Term Goal #1 Start Date  09/01/18  Interventions for Short Term Goal #1  RN reiteraterated the need for patient to find a well fitting stocking. RN will continue to follow up  [facility] CM Short Term Goal #2   Patient will not have any falls within the next 30 days  THN CM Short Term Goal #2 Start Date  09/01/18  Interventions for Short Term Goal #2  RN discussed rationale causing her fall. RN is working with patient to get a siderail for bed. RN will follow up for further evaluation      Assessment:  Per patient she slid to floor off side of bed Patient sustained bruises on arm and knees Patient is having difficulty getting alcohol wipes and alcohol for insulin injections Patient scale is not working Patient has not been weighing daily Patient has been gaining weight Patient has been eating one meal a day  Plan:  RN discussed eating 3 meals and snacks to help  increase metabolism RN discussed healthy meal and snack preparation RN reiterated CHF zones and action plan RN discussed medication adherence RN discussed increasing exercising RN contacted [place] about railings for bed RN sent patient box of Alcohol preps RN will follow up within the month of July Referred to social worker  [place] Management [phone]

## 2018-09-02 ENCOUNTER — Other Ambulatory Visit: Payer: Self-pay | Admitting: Internal Medicine

## 2018-09-02 ENCOUNTER — Other Ambulatory Visit: Payer: Self-pay

## 2018-09-02 DIAGNOSIS — E118 Type 2 diabetes mellitus with unspecified complications: Secondary | ICD-10-CM

## 2018-09-02 NOTE — Patient Outreach (Signed)
[facility]) Care Management  09/02/2018  Kathleen [name] April 25, 1951 [id number]  Unsuccessful outreach to patient regarding social work referral.  Referral stated "This patient is being referred because she has a flip cell phone and she stated that the Obama phones are being recalled and wanted to know if there are other free phones that will available for medicare/ medicaid patients" BSW left voicemail message on mobile number.  Unable to leave message on home number due to mailbox being full. BSW is unaware of phone resources.  If able to contact, BSW will encourage patient to contact caseworker with DSS.  BSW will also suggest purchase of Tracfone with purchasing of minutes as needed. BSW will attempt to reach her again within four business days.  Ronn Melena, BSW Social Worker [phone]

## 2018-09-05 ENCOUNTER — Encounter: Payer: Self-pay | Admitting: Physician Assistant

## 2018-09-05 ENCOUNTER — Other Ambulatory Visit: Payer: Self-pay

## 2018-09-05 NOTE — Patient Outreach (Signed)
[facility] [facility]) Care Management  09/05/2018  Kathleen [name] 1950/11/30 [id number]   Successful outreach to patient regarding social work referral.  Referral stated "This patient is being referred because she has a flip cell phone and she stated that the Obama phones are being recalled and wanted to know if there are other free phones that will available for medicare/ medicaid patients" Patient has land line and reported that she only uses her mobile number occasionally.  BSW recommended that patient contact Department of Social Services regarding fee mobile phone options.  BSW talked with patient about the Tracfone which would allow her to purchase minutes as needed.  BSW provided her with customer service number. BSW is closing case at this time.  Ronn Melena, BSW Social Worker [phone]

## 2018-09-05 NOTE — Progress Notes (Addendum)
Virtual Visit via Telephone Note   This visit type was conducted due to national recommendations for restrictions regarding the COVID-19 Pandemic (e.g. social distancing) in an effort to limit this patient's exposure and mitigate transmission in our community.  Due to her co-morbid illnesses, this patient is at least at moderate risk for complications without adequate follow up.  This format is felt to be most appropriate for this patient at this time.  The patient did not have access to video technology/had technical difficulties with video requiring transitioning to audio format only (telephone).  All issues noted in this document were discussed and addressed.  No physical exam could be performed with this format.  Please refer to the patient's chart for her  consent to telehealth for [facility].   Date:  09/07/2018   ID:  Kathleen [name], DOB 05/25/1950, MRN [id number]  Patient Location: Home Provider Location: Home  PCP:  Kathleen Contes, MD  Cardiologist:  Kathleen Him, MD  Electrophysiologist:  None   Evaluation Performed:  Follow-Up Visit  Chief Complaint:  F/u atrial flutter, CHF  History of Present Illness:    Kathleen [name] is a 68 y.o. female with permanent atrial flutter with baseline bradycardia, hypertension, super morbid obesity, IIDDM, dyslipidemia, obesity hypoventilation syndrome (on home O2 with no OSA by PSG but showed nocturnal hypoxemia), CKD stage IV, chronic diastolic CHF, chronic edema likely due in part to obesity who is seen for virtual follow-up. Last echo 04/2016 showed EF 55-60%, mild MR, mild LAE/RAE. She has had persistent edema despite diuretic medication titration. Last labs 05/2018 K 4.5, BUN 41, Cr 1.99, Hgb 11.5, Plt 135, LDL 24.  She feels she is doing generally well except her weight has continued to gradually increase now to 301lb. She is up 19 lb from 05/2018's 282lb although it had gotten up as high as 294 that month. Her leg swelling was  particularly bad last week. It has improved some this week but still significant. At last OV 05/2018 Dr. Radford Pax reported this was still 3+ despite diuresis and weight loss so likely worsened by her obesity. [name] endorses falling off the horse with bad habits likely leading to gradual weight gain since 2016 (250s -> upward in the last 4 years gradually), but recently purchased an air fryer and has also been trying to get on the exercise bike in increments over the day. She is able to do so without any new SOB compared to her baseline. No chest pain, palpitations, dizziness, syncope or bleeding. Reports limiting fluid to 2L per day and tries to limit sodium. The patient does not have symptoms concerning for COVID-19 infection (fever, chills, cough, or new shortness of breath).    Past Medical History:  Diagnosis Date   Atrial flutter ([place])    a. permanent.   Bradycardia    Chronic diastolic (congestive) heart failure ([place]) 01/10/2015   CKD (chronic kidney disease), stage IV (HCC)    Degenerative joint disease of hand    Diabetes mellitus    Dyslipidemia    Fecal occult blood test positive    GERD (gastroesophageal reflux disease)    Headache    Hypertension    Inadequate material resources    Irritable bowel syndrome    Morbid obesity ([place])    Obesity hypoventilation syndrome (HCC)    Post-menopausal bleeding    Shortness of breath dyspnea    multifactorial from obesity, deconditioning, obesity hypoventilation syndrome   Past Surgical History:  Procedure Laterality  Date   CHOLECYSTECTOMY     EYE SURGERY     left lens implant s/p cataracts   OTHER SURGICAL HISTORY     right shoulder tendon repair 05/2011     Current Meds  Medication Sig   Accu-Chek FastClix Lancets MISC TEST BLOOD SUGAR THREE TIMES DAILY   Accu-Chek Softclix Lancets lancets TEST BLOOD SUGAR THREE TIMES DAILY   apixaban (ELIQUIS) 5 MG TABS tablet Take 1 tablet (5 mg total) by mouth 2 (two)  times daily.   atorvastatin (LIPITOR) 40 MG tablet Take 1 tablet (40 mg total) by mouth daily.   Blood Glucose Monitoring Suppl (ACCU-CHEK AVIVA PLUS) w/Device KIT TEST BLOOD SUGAR THREE TIMES DAILY   gabapentin (NEURONTIN) 300 MG capsule TAKE 1 CAPSULE EVERY MORNING, TAKE 1 CAPSULE IN THE AFTERNOON, AND TAKE 2 CAPSULES EVERY NIGHT   glucose blood test strip TEST BLOOD SUGAR THREE TIMES DAILY   insulin detemir (LEVEMIR) 100 UNIT/ML injection Inject 65 units total into the skin once daily.   Insulin Pen Needle (B-D UF III MINI PEN NEEDLES) 31G X 5 MM MISC USE ONE TIME DAILY TO INJECT VICTOZA. DX:E11.9   Insulin Syringe-Needle U-100 (BD VEO INSULIN SYRINGE U/F) 31G X 15/64" 1 ML MISC USE TO INJECT INSULIN ONE TIME A DAY   liraglutide (VICTOZA) 18 MG/3ML SOPN Inject 0.3 mLs (1.8 mg total) into the skin daily.   metFORMIN (GLUCOPHAGE) 1000 MG tablet TAKE 1 TABLET (1,000 MG TOTAL) BY MOUTH 2 (TWO) TIMES DAILY WITH A MEAL.   Multiple Vitamins-Minerals (HEALTHY EYES PO) Take 1 tablet by mouth daily. Reported on 05/17/2015   NON FORMULARY Place 2 L into the nose daily. Wears with exertion and qhs   torsemide (DEMADEX) 20 MG tablet Take 1 tablet (20 mg total) by mouth 2 (two) times daily.     Allergies:   Doxycycline   Social History   Tobacco Use   Smoking status: Never Smoker   Smokeless tobacco: Never Used  Substance Use Topics   Alcohol use: No    Alcohol/week: 0.0 standard drinks   Drug use: No     Family Hx: The patient's family history includes Diabetes in her mother and sister; Obesity in her mother; Stroke in her father. There is no history of Colon cancer.  ROS:   Please see the history of present illness.    All other systems reviewed and are negative.   Prior CV studies:   Most recent pertinent cardiac studies are outlined above.   Labs/Other Tests and Data Reviewed:    EKG:  An ECG dated 06/17/18 was personally reviewed today and demonstrated:  atrial  flutter with slow ventricular response 48bpm, LAFB, lower voltage, nonspecific STT changes  Recent Labs: 06/17/2018: Hemoglobin 11.5; Platelets 135 06/24/2018: BUN 41; Creatinine, Ser 1.99; Potassium 4.5; Sodium 143   Recent Lipid Panel Lab Results  Component Value Date/Time   CHOL 66 (L) 06/07/2018 11:54 AM   TRIG 60 06/07/2018 11:54 AM   HDL 30 (L) 06/07/2018 11:54 AM   CHOLHDL 2.2 06/07/2018 11:54 AM   CHOLHDL 3.5 06/07/2014 10:27 AM   LDLCALC 24 06/07/2018 11:54 AM    Wt Readings from Last 3 Encounters:  09/07/18 (!) 301 lb (136.5 kg)  06/17/18 282 lb (127.9 kg)  06/07/18 294 lb 12.8 oz (133.7 kg)     Objective:    Vital Signs:  Ht _0  (1.549 m)    Wt (!) 301 lb (136.5 kg)    BMI 56.87 kg/m  VITAL SIGNS:  reviewed  General - female in no acute distress Pulm - No labored breathing, no coughing during visit, no audible wheezing, speaking in full sentences Neuro - A+Ox3, no slurred speech, answers questions appropriately Psych - Pleasant affect     ASSESSMENT & PLAN:    1. Permanent atrial flutter with bradycardia - symptomatically stable without any new palpitaitons, dizziness or syncope but does not have BP or HR today. Has in-person visit with PCP next week. 2. Chronic diastolic CHF - given historical morbid obesity with gradual yearly weight gain and virtual visit setting, it is very challenging to ascertain volume status which is only confounded by CKD. Agree with primary care's plan to see in person next week but we may need to enact intervention in the meantime. I will reach out to Dr. Radford Pax to get her thoughts since she has met the patient physically before. Addendum: per discussion with Dr. Radford Pax, will plan to initiate home health visit through [facility] to go out and obtain REDS vest reading to evaluate volume status, and obtain BP and HR prior to medication adjustment. See separate phone note referral. This company cannot get labs, so it will be helpful to  keep the in-person appointment with PCP next week as well to monitor progress with physician assessment and renal function. 3. CKD III-IV - will need repeat labwork soon given recent fluid retention. She indicates primary care had considered referral to nephrology but wished to hold off in case they could manage this. Might be nearing point this would be helpful to have established - she says PCP was going to decide based on eval next week. 4. Morbid obesity - likely crux of her issues. Sounds like she is starting to make serious lifestyle changes. I am hopeful she can stick with them as her comorbidities will become increasingly challenging to manage without addressing this.  COVID-19 Education: The signs and symptoms of COVID-19 were discussed with the patient and how to seek care for testing (follow up with PCP or arrange E-visit). The importance of social distancing was discussed today.  Time:   Today, I have spent 18 minutes with the patient with telehealth technology discussing the above problems.     Medication Adjustments/Labs and Tests Ordered: Current medicines are reviewed at length with the patient today.  Concerns regarding medicines are outlined above.   Disposition:  Follow up to be determined based on discussion with Dr. Radford Pax today. Addendum: Will tentatively arrange in-person follow-up in 3 months per prior recall, pending home health assessment for any changes.  Signed, Charlie Pitter, PA-C  09/07/2018 12:06 PM    [facility]

## 2018-09-06 ENCOUNTER — Ambulatory Visit (INDEPENDENT_AMBULATORY_CARE_PROVIDER_SITE_OTHER): Payer: Medicare HMO | Admitting: Internal Medicine

## 2018-09-06 ENCOUNTER — Other Ambulatory Visit: Payer: Self-pay

## 2018-09-06 ENCOUNTER — Other Ambulatory Visit: Payer: Self-pay | Admitting: *Deleted

## 2018-09-06 ENCOUNTER — Telehealth: Payer: Self-pay | Admitting: *Deleted

## 2018-09-06 DIAGNOSIS — N183 Chronic kidney disease, stage 3 unspecified: Secondary | ICD-10-CM

## 2018-09-06 DIAGNOSIS — I10 Essential (primary) hypertension: Secondary | ICD-10-CM | POA: Diagnosis not present

## 2018-09-06 DIAGNOSIS — J9611 Chronic respiratory failure with hypoxia: Secondary | ICD-10-CM | POA: Diagnosis not present

## 2018-09-06 DIAGNOSIS — I5032 Chronic diastolic (congestive) heart failure: Secondary | ICD-10-CM

## 2018-09-06 DIAGNOSIS — E1169 Type 2 diabetes mellitus with other specified complication: Secondary | ICD-10-CM | POA: Diagnosis not present

## 2018-09-06 DIAGNOSIS — I4892 Unspecified atrial flutter: Secondary | ICD-10-CM | POA: Diagnosis not present

## 2018-09-06 DIAGNOSIS — G629 Polyneuropathy, unspecified: Secondary | ICD-10-CM

## 2018-09-06 DIAGNOSIS — Z9181 History of falling: Secondary | ICD-10-CM | POA: Insufficient documentation

## 2018-09-06 MED ORDER — GABAPENTIN 300 MG PO CAPS: ORAL_CAPSULE | ORAL | 3 refills | Status: AC

## 2018-09-06 MED ORDER — ACCU-CHEK SOFTCLIX LANCETS MISC: 1 refills | Status: DC

## 2018-09-06 MED ORDER — GLUCOSE BLOOD VI STRP: ORAL_STRIP | 1 refills | Status: DC

## 2018-09-06 MED ORDER — INSULIN PEN NEEDLE 31G X 5 MM MISC: 4 refills | Status: AC

## 2018-09-06 MED ORDER — ACCU-CHEK FASTCLIX LANCETS MISC: 1 refills | Status: AC

## 2018-09-06 NOTE — Assessment & Plan Note (Signed)
-  This problem is chronic and stable -Patient has been off all her antihypertensives except for torsemide 20 mg twice daily and her blood pressures been well controlled -We will continue with current regimen for now -No further work-up at this time

## 2018-09-06 NOTE — Assessment & Plan Note (Signed)
-  This problem is chronic and stable -Patient was recently changed from bumetanide to torsemide and her repeat BMP showed a stable creatinine at 1.99 -We will need to DC her metformin if her creatinine continues to worsen -We will repeat her BMP at her follow-up visit -No further work-up at this time

## 2018-09-06 NOTE — Telephone Encounter (Signed)
Community message sent to Levada Dy at [facility] that order has been placed. Hubbard Hartshorn, RN, BSN

## 2018-09-06 NOTE — Patient Outreach (Signed)
[facility]) Care Management  09/06/2018  Kathleen [name] 06-22-1950 [id number]  RN Health Coach telephone call to patient.  [employer] compliance verified. RN spoke with patient and gave her the phone number to call [facility] to get a 1/2 bedrails for bed. Patient had recent incident of sliding  off the bed into the floor. She had received bruises on arms and knees.  Plan: Patient will call advance and checking on pricing.  [facility] [phone]

## 2018-09-06 NOTE — Assessment & Plan Note (Signed)
-  This problem is chronic and stable -Continue with apixaban for anticoagulation -Patient has not required medications for rate control as her heart rate is always borderline bradycardic -No further work-up at this time

## 2018-09-06 NOTE — Telephone Encounter (Signed)
Order given to [facility].

## 2018-09-06 NOTE — Telephone Encounter (Signed)
Call from pt - stated she forget to tell her doctor she needs a rx fax to [facility] for "half bed rail" . Stated her doctor is aware she felled.

## 2018-09-06 NOTE — Telephone Encounter (Signed)
Need an order (DME or on a rx) to be fax to [facility].

## 2018-09-06 NOTE — Assessment & Plan Note (Signed)
-  This problem is chronic and stable -Patient states that her shortness of breath is at her baseline -She does state that she has received a letter from the [facility] stating that this may not be renewed for her -I advised her to continue with her oxygen now and bring the letter to her follow-up appointment -No further work-up at this time

## 2018-09-06 NOTE — Progress Notes (Signed)
   Subjective:    Patient ID: Kathleen [name], female    DOB: [birth year], 68 y.o.   MRN: [id number]  This is a telephone encounter between YUMI INSALACO and Aldine Contes on 09/06/2018 for HTN and CHF. The visit was conducted with the patient located at home and Aldine Contes at [facility]. The patient's identity was confirmed using their DOB and current address. The patient has consented to being evaluated through a telephone encounter and understands the associated risks (an examination cannot be done and the patient may need to come in for an appointment) / benefits (allows the patient to remain at home, decreasing exposure to coronavirus). I personally spent 15 minutes on medical discussion.   HPI This is a telephone visit with the patient for follow-up of her hypertension and chronic diastolic heart failure.  Patient states that since she has been switched to her torsemide she has noticed increased swelling in her legs and also that her blood sugars have been elevated.  Over the last week however she states that her blood sugars have been better controlled and also that her lower semi-swelling has improved.  Patient also complains of a fall at home when attempting to get out of bed.  She states that she is still "sore" but feels well otherwise.  She states that she is compliant with all her medications and denies other complaints at this time.   Review of Systems  Constitutional: Negative.   HENT: Negative.   Respiratory: Negative.   Cardiovascular: Positive for leg swelling. Negative for chest pain.  Gastrointestinal: Negative.   Musculoskeletal: Negative.   Psychiatric/Behavioral: Negative.        Objective:   Physical Exam  As this is a telephone visit vitals were not recorded and no physical exam was able to be conducted      Assessment & Plan:  Please see problem based charting for assessment and plan:

## 2018-09-06 NOTE — Assessment & Plan Note (Signed)
-  This problem is chronic and stable -Patient states that over the last couple weeks her blood sugars have been elevated but over the last week they appear to have come back down and are currently in the 130s to 150s -We will check an A1c at her in person visit -We will continue with current regimen for now with Victoza 1.8 mg daily, Levemir 65 units and metformin 1000 mg twice daily -If patient's creatinine continues to worsen we may need to DC her metformin

## 2018-09-06 NOTE — Assessment & Plan Note (Signed)
-  This problem is chronic and stable -Patient states that her symptoms are well controlled on her current regimen of gabapentin 300 mg in the morning, 3 mg in the afternoon and 600 mg at night -We will refill this medication for her today -No further work-up at this time

## 2018-09-06 NOTE — Telephone Encounter (Signed)
Placed the order. Thank you

## 2018-09-06 NOTE — Assessment & Plan Note (Signed)
-  Patient has a history of chronic diastolic heart failure and was recently switched from her bumetanide to torsemide 20 mg twice daily by cardiology -Patient states that since switching to torsemide she has noted that she has had some difficulty ambulating as well as some increased swelling in her legs -The last time she checked her weight was up to 300 pounds which is 18 pounds more than her last visit here -She is unable to tell if her legs are swollen but denies any shortness of breath currently -Patient will need me in person appointment to assess volume status. -Patient also need repeat BMP at this visit

## 2018-09-06 NOTE — Telephone Encounter (Signed)
Glenda what do I need to do for this?

## 2018-09-07 ENCOUNTER — Telehealth: Payer: Self-pay | Admitting: Physician Assistant

## 2018-09-07 ENCOUNTER — Encounter: Payer: Self-pay | Admitting: Physician Assistant

## 2018-09-07 ENCOUNTER — Telehealth (INDEPENDENT_AMBULATORY_CARE_PROVIDER_SITE_OTHER): Payer: Medicare HMO | Admitting: Physician Assistant

## 2018-09-07 ENCOUNTER — Other Ambulatory Visit: Payer: Self-pay

## 2018-09-07 VITALS — Ht 61.0 in | Wt 301.0 lb

## 2018-09-07 DIAGNOSIS — R001 Bradycardia, unspecified: Secondary | ICD-10-CM

## 2018-09-07 DIAGNOSIS — I4892 Unspecified atrial flutter: Secondary | ICD-10-CM

## 2018-09-07 DIAGNOSIS — I5032 Chronic diastolic (congestive) heart failure: Secondary | ICD-10-CM

## 2018-09-07 DIAGNOSIS — N184 Chronic kidney disease, stage 4 (severe): Secondary | ICD-10-CM

## 2018-09-07 NOTE — Telephone Encounter (Signed)
[facility] Visit Initial Request  Agency Requested:    [facility] Contact:  Kathleen [name], [place], [place], [place], [place]  [address] [email or web address]  Phone #: [phone] Fax #: [phone]  Patient Demographic Information: Name:  Kathleen [name] Age:  68 y.o.   DOB:  [birth year]  MRN:  [id number]   Address:   [address] [place] [address]   Phone Numbers:   Home Phone [phone]  Work Phone [phone]  Mobile [phone]     Emergency Contact Information on File:   Contact Information    Name Kathleen [name] [phone]        The above family members may be contacted for information on this patient (review DPR on file):  No   Patient Clinical Information:  Primary Care Provider:  Aldine Contes, MD  Primary Cardiologist:  Fransico Him, MD  Primary Electrophysiologist:  None   Requesting Provider:  Melina Copa PA-C   Past Medical Hx: Kathleen [name]  has a past medical history of Atrial flutter ([place]), Bradycardia, Chronic diastolic (congestive) heart failure ([place]) (01/10/2015), CKD (chronic kidney disease), stage IV ([place]), Degenerative joint disease of hand, Diabetes mellitus, Dyslipidemia, Fecal occult blood test positive, GERD (gastroesophageal reflux disease), Headache, Hypertension, Inadequate material resources, Irritable bowel syndrome, Morbid obesity ([place]), Obesity hypoventilation syndrome ([place]), Post-menopausal bleeding, and Shortness of breath dyspnea.   Allergies: She is allergic to doxycycline.   Medications: Current Outpatient Medications on File Prior to Visit  Medication Sig  . Accu-Chek FastClix Lancets MISC TEST BLOOD SUGAR THREE TIMES DAILY  . Accu-Chek Softclix Lancets lancets TEST BLOOD SUGAR THREE TIMES DAILY  . apixaban (ELIQUIS) 5 MG TABS tablet Take 1 tablet (5 mg total) by mouth 2 (two) times daily.  Marland Kitchen atorvastatin  (LIPITOR) 40 MG tablet Take 1 tablet (40 mg total) by mouth daily.  . Blood Glucose Monitoring Suppl (ACCU-CHEK AVIVA PLUS) w/Device KIT TEST BLOOD SUGAR THREE TIMES DAILY  . gabapentin (NEURONTIN) 300 MG capsule TAKE 1 CAPSULE EVERY MORNING, TAKE 1 CAPSULE IN THE AFTERNOON, AND TAKE 2 CAPSULES EVERY NIGHT  . glucose blood test strip TEST BLOOD SUGAR THREE TIMES DAILY  . insulin detemir (LEVEMIR) 100 UNIT/ML injection Inject 65 units total into the skin once daily.  . Insulin Pen Needle (B-D UF III MINI PEN NEEDLES) 31G X 5 MM MISC USE ONE TIME DAILY TO INJECT VICTOZA. DX:E11.9  . Insulin Syringe-Needle U-100 (BD VEO INSULIN SYRINGE U/F) 31G X 15/64" 1 ML MISC USE TO INJECT INSULIN ONE TIME A DAY  . liraglutide (VICTOZA) 18 MG/3ML SOPN Inject 0.3 mLs (1.8 mg total) into the skin daily.  . metFORMIN (GLUCOPHAGE) 1000 MG tablet TAKE 1 TABLET (1,000 MG TOTAL) BY MOUTH 2 (TWO) TIMES DAILY WITH A MEAL.  . Multiple Vitamins-Minerals (HEALTHY EYES PO) Take 1 tablet by mouth daily. Reported on 05/17/2015  . NON FORMULARY Place 2 L into the nose daily. Wears with exertion and qhs  . torsemide (DEMADEX) 20 MG tablet Take 1 tablet (20 mg total) by mouth 2 (two) times daily.   No current facility-administered medications on file prior to visit.      Social Hx: She  reports that she has never smoked. She has never used smokeless tobacco. She reports that she does not drink alcohol or use drugs.    Diagnosis/Reason for Visit:   This patient needs to have ReDS vest reading  and skilled assessment by nurse along with vitals to evaluate volume status. She is a 3F with permanent atrial flutter with baseline bradycardia, hypertension, super morbid obesity, IIDDM, dyslipidemia, obesity hypoventilation syndrome (on home O2 with no OSA by PSG but showed nocturnal hypoxemia), CKD stage IV, chronic diastolic CHF, chronic edema likely due in part to obesity who I saw in virtual follow-up today. At last OV in 05/2018 she  was still noted to have 3+ pitting edema which Dr. Radford Pax felt was likely due in part to her super morbid obesity. Bumex was changed to torsemide 32m BID. The patient reports being symptomatically stable in all ways except 19lb weight gain since 05/2018. However, when you look at her weight over the last few years she has had general uptrend (which is common with morbidly obese patients as their obesity makes it harder and harder to remain active). Her situation is complicated by CKD stage 4. She also had reported some poor dietary choices lately but was getting back on the wagon. Need to make sure she is being compliant with meds, diet, and evaluate volume status along with vitals. Thank you!   Services Requested:  Vital Signs (BP, Pulse, O2, Weight)  Physical Exam  Medication Reconciliation  ReDS Heart Failure Measurement  Assess for Other Rehab/Nursing Needs  # of Visits Needed/Frequency per Week: 2 visits needed - 1 per week for 2 weeks to start, please  A copy of the office note will be faxed with this form.

## 2018-09-07 NOTE — Patient Instructions (Signed)
Medication Instructions:  .instvcu  If you need a refill on your cardiac medications before your next appointment, please call your pharmacy.   Lab work: None ordered  If you have labs (blood work) drawn today and your tests are completely normal, you will receive your results only by: Marland Kitchen MyChart Message (if you have MyChart) OR . A paper copy in the mail If you have any lab test that is abnormal or we need to change your treatment, we will call you to review the results.  Testing/Procedures: None ordered  Follow-Up: At [facility], you and your health needs are our priority.  As part of our continuing mission to provide you with exceptional heart care, we have created designated Provider Care Teams.  These Care Teams include your primary Cardiologist (physician) and Advanced Practice Providers (APPs -  Physician Assistants and Nurse Practitioners) who all work together to provide you with the care you need, when you need it. You will need a follow up appointment in 3 months.  Please call our office 2 months in advance to schedule this appointment.  You may see Fransico Him, MD or one of the following Advanced Practice Providers on your designated Care Team:   [place], PA-C Melina Copa, PA-C . Ermalinda Barrios, PA-C  Any Other Special Instructions Will Be Listed Below (If Applicable).

## 2018-09-08 ENCOUNTER — Telehealth: Payer: Self-pay | Admitting: Physician Assistant

## 2018-09-08 ENCOUNTER — Other Ambulatory Visit: Payer: Self-pay | Admitting: Medical

## 2018-09-08 ENCOUNTER — Telehealth: Payer: Self-pay | Admitting: Medical

## 2018-09-08 MED ORDER — POTASSIUM CHLORIDE CRYS ER 20 MEQ PO TBCR: 20.0000 meq | EXTENDED_RELEASE_TABLET | ORAL | 0 refills | Status: DC

## 2018-09-08 MED ORDER — METOLAZONE 5 MG PO TABS: 5.0000 mg | ORAL_TABLET | ORAL | 0 refills | Status: DC

## 2018-09-08 NOTE — Telephone Encounter (Addendum)
[place] Visit Follow Up Request   Agency Requested:    [facility] Contact:  Joen Laura, [place], [place], [place], [place]  [address] [email or web address]  Phone #: [phone] Fax #: [phone]  Patient Demographic Information: Name:  Kathleen [name] Age:  68 y.o.   DOB:  19-Sep-1950  MRN:  [id number]   Requesting Provider:  [place] visit progress note(s), lab results, telemetry strips, etc were reviewed.  Provider Recommendations: Spoke with Joen Laura, coordinator for [facility], about pt's eval today. See scan under media. ReDS vest reading was 55%. Per our discussion it sounds like they have found that this can be inaccurate in setting of severe obesity. Pt's lungs were clear. She had edema but did not appear to be grossly overloaded on exam, but exam challenging by morbidly obese habitus at baseline. The patient reported an continued increase in weight up to 308lb now (+ 7 additional pounds since last few days). The patient was not using her oxygen as recommended and was advised to use this as she was hypoxic without it. They are also looking into getting her some HHPT.  I reviewed case with Dr. Radford Pax. We are concerned about additional weight gain. The patient has been a little resistant in the past to go up on her torsemide dose. Per our discussion, would recommend metolazone 5mg  once daily x 2 days. After I spoke with Dr. Radford Pax I also had heard from [place] regarding [facility] data and he suggested a dose of 76meq of potassium with each metolazone dose. I relayed instructions to patient and send into her pharmacy. Her family member is going to pick it up now. Dr. Radford Pax is taking beeper call on Saturday so I asked the pt to call our office Saturday AM with an update in how she is doing.   She has appt in office with PCP on Tuesday at which time we would recommend labs. [facility] also plans  to go back out with another vest reading in a week (this was included in the original request I sent yesterday - I am just documenting our changes in this note and routing to [facility] so they are in the loop).  Follow up home services requested in 1 week-  Vital Signs (BP, Pulse, O2, Weight)  Physical Exam  Medication Reconciliation  ReDS Heart Failure Measurement  Assess for Other Rehab/Nursing Needs   Dayna Dunn PA-C

## 2018-09-08 NOTE — Telephone Encounter (Signed)
   Patient called with questions regarding medication changes today. Per Dr. Theodosia Blender recommendations, patient was prescribed metolazone 5mg  daily to take x2 days with po potassium supplement. Patient stated there was an issue with her prescription. Called [facility] who needed clarification on dosing instructions - updated prescription for metolazone 5mg  daily x2 days with po potassium. Patient states she cannot drive and her sister cannot pick up the medication today. Informed her that the medication would be ready for pick up in the AM and she should start the metolazone at that time. Patient in agreement with the plan. No further questions.   Abigail Butts, PA-C 09/08/18; 5:59 PM

## 2018-09-09 DIAGNOSIS — R062 Wheezing: Secondary | ICD-10-CM | POA: Diagnosis not present

## 2018-09-09 DIAGNOSIS — I509 Heart failure, unspecified: Secondary | ICD-10-CM | POA: Diagnosis not present

## 2018-09-09 DIAGNOSIS — I10 Essential (primary) hypertension: Secondary | ICD-10-CM | POA: Diagnosis not present

## 2018-09-11 ENCOUNTER — Telehealth: Payer: Self-pay | Admitting: Cardiology

## 2018-09-11 NOTE — Telephone Encounter (Signed)
Patient called to inform us how she did with the 2 doses of Metolazone she took on Friday and Saturday.  She says that she feels better.  Her weight has not really changed and she still weighs 308lbs but her legs feel better and do not hurt.  Her edema has gone down but still has fluid in her abdomen.  SOB is much improved.  I have instructed her to increase Torsemide to 40mg  BID. She has an appt on Tuesday with her PCP for followup.  Please set her up with [facility] and call in new Rx for Torsemide 40mg  BID.  Please get PCP to check labs on Tuesday and fax to [place] a BMET.

## 2018-09-13 ENCOUNTER — Other Ambulatory Visit: Payer: Self-pay

## 2018-09-13 ENCOUNTER — Telehealth: Payer: Self-pay | Admitting: Physician Assistant

## 2018-09-13 ENCOUNTER — Ambulatory Visit (INDEPENDENT_AMBULATORY_CARE_PROVIDER_SITE_OTHER): Payer: Medicare HMO | Admitting: Internal Medicine

## 2018-09-13 ENCOUNTER — Telehealth: Payer: Self-pay | Admitting: *Deleted

## 2018-09-13 ENCOUNTER — Encounter: Payer: Self-pay | Admitting: Internal Medicine

## 2018-09-13 ENCOUNTER — Ambulatory Visit (HOSPITAL_COMMUNITY)
Admission: RE | Admit: 2018-09-13 | Discharge: 2018-09-13 | Disposition: A | Discharge status: Home / Self Care | Payer: Medicare HMO | Source: Ambulatory Visit | Attending: Internal Medicine | Admitting: Internal Medicine

## 2018-09-13 VITALS — BP 131/42 | HR 50 | Temp 97.6°F | Ht 60.0 in | Wt 303.5 lb

## 2018-09-13 DIAGNOSIS — I5032 Chronic diastolic (congestive) heart failure: Secondary | ICD-10-CM | POA: Diagnosis not present

## 2018-09-13 DIAGNOSIS — E1122 Type 2 diabetes mellitus with diabetic chronic kidney disease: Secondary | ICD-10-CM

## 2018-09-13 DIAGNOSIS — Z79899 Other long term (current) drug therapy: Secondary | ICD-10-CM

## 2018-09-13 DIAGNOSIS — I130 Hypertensive heart and chronic kidney disease with heart failure and stage 1 through stage 4 chronic kidney disease, or unspecified chronic kidney disease: Secondary | ICD-10-CM

## 2018-09-13 DIAGNOSIS — N183 Chronic kidney disease, stage 3 unspecified: Secondary | ICD-10-CM

## 2018-09-13 DIAGNOSIS — Z794 Long term (current) use of insulin: Secondary | ICD-10-CM

## 2018-09-13 DIAGNOSIS — R0602 Shortness of breath: Secondary | ICD-10-CM | POA: Diagnosis not present

## 2018-09-13 DIAGNOSIS — E1169 Type 2 diabetes mellitus with other specified complication: Secondary | ICD-10-CM | POA: Diagnosis not present

## 2018-09-13 DIAGNOSIS — J9611 Chronic respiratory failure with hypoxia: Secondary | ICD-10-CM | POA: Diagnosis not present

## 2018-09-13 DIAGNOSIS — I10 Essential (primary) hypertension: Secondary | ICD-10-CM

## 2018-09-13 LAB — POCT GLYCOSYLATED HEMOGLOBIN (HGB A1C): Hemoglobin A1C: 7.4 % — AB (ref 4.0–5.6)

## 2018-09-13 LAB — GLUCOSE, CAPILLARY: Glucose-Capillary: 147 mg/dL — ABNORMAL HIGH (ref 70–99)

## 2018-09-13 MED ORDER — TORSEMIDE 20 MG PO TABS: 40.0000 mg | ORAL_TABLET | Freq: Two times a day (BID) | ORAL | 1 refills | Status: DC

## 2018-09-13 MED ORDER — METFORMIN HCL 500 MG PO TABS: 500.0000 mg | ORAL_TABLET | Freq: Two times a day (BID) | ORAL | 1 refills | Status: AC

## 2018-09-13 NOTE — Assessment & Plan Note (Signed)
Lab Results  Component Value Date   HGBA1C 7.4 (A) 09/13/2018   HGBA1C 7.0 (A) 06/07/2018   HGBA1C 7.5 (A) 02/23/2018     Assessment: Diabetes control:  Fair Progress toward A1C goal:   Deteriorated Comments: Patient is compliant with metformin 1000 mg twice daily, Victoza 1.8 mg daily, Levemir 65 units once daily.  Patient's blood sugars over the last month have been averaging 132 with the highest of 214 and the lowest of 92 and was in range 97% of the time.  Plan: Medications:  Given patient's worsening renal function we will decrease her metformin to 500 mg twice daily and continue the rest of her medications Home glucose monitoring: Frequency:   Timing:   Instruction/counseling given: reminded to bring blood glucose meter & log to each visit and discussed diet Educational resources provided:   Self management tools provided:   Other plans: We will check BMP today.  We will also refer patient to [facility] for nutrition counseling

## 2018-09-13 NOTE — Telephone Encounter (Signed)
Pt has been scheduled a virtual visit via phone with Melina Copa, PA-C, 09/21/2018.

## 2018-09-13 NOTE — Telephone Encounter (Signed)
Call to [facility] concerning letter that patient received about her AccuChek Aviva Test Strips.  Patient's prescription has not been received at the [facility].  [facility] [place].  Prescription has been received and message was that it was to early to have strips filled.  Last fill was 07/13/2018.   Will await time for next refill to check on need for PA.  Sander Nephew, RN 09/13/2018 1:39 PM.

## 2018-09-13 NOTE — Assessment & Plan Note (Signed)
-  This problem is chronic and stable -Blood pressure today is 130s over 40s -The only blood pressure medications that she is currently on is her diuretics which were supposed to have been increased to torsemide 40 mg twice a day but patient is still taking 20 mg twice a day -We will check BMP today

## 2018-09-13 NOTE — Telephone Encounter (Signed)
Patient saw PCP today, note pending. Pt is to continue higher dose of torsemide. Anderson Malta, can you please arrange for a virtual OV with cardiology in 1 week? (me or Dr. Theodosia Blender care team) Thanks, Lisbeth Renshaw

## 2018-09-13 NOTE — Telephone Encounter (Signed)
Left a message for the patient to call back.  

## 2018-09-13 NOTE — Assessment & Plan Note (Signed)
-  This problem is chronic -Patient's creatinine appears to be slightly worsening over the last couple of months and her last creatinine was 1.99 -We will follow BMP today -Given patient's need for increased diuresis as well as progressively worsening renal failure will refer patient to nephrology for close follow-up

## 2018-09-13 NOTE — Assessment & Plan Note (Signed)
-  Patient has a history of chronic hypoxic respiratory failure -At baseline she is supposed to be on 2 L nasal cannula but did not bring this with her to her visit today -Today patient's O2 sats were in the 80s on room air and dropped to the 70s with ambulation which went up to the 90s on rest with 2 L nasal cannula -We will continue to diurese the patient at home per patient's wishes but have a low threshold for admission if she does not improve

## 2018-09-13 NOTE — Patient Instructions (Signed)
-  It was a pleasure seeing you today -We will decrease your metformin to 500 mg twice a day -We will check your kidney function today -Your A1c has slightly worsened to 7.4.  We will continue to monitor this closely -Please continue with torsemide 40 mg twice a day (2 tablets of the 20 mg tablets twice daily) -I will also put in referral to the kidney doctor -We will check a chest x-ray today

## 2018-09-13 NOTE — Progress Notes (Signed)
   Subjective:    Patient ID: Kathleen [name], female    DOB: 22-Aug-1950, 68 y.o.   MRN: [id number]  HPI  I seen and examined this patient.  Patient is here for follow-up of her chronic diastolic heart failure and type II diabetes.  Patient was asked to follow-up today from a tele-visit that I did with her 1 week ago during which she complained of increasing weight as well as worsening shortness of breath.  Patient states that shortness of breath is better today than it has been but she still has lower extremity swelling as well as intermittent wheezing and difficulty ambulating secondary to shortness of breath.  Patient states that she is compliant with her medications and denies other complaints at this time.  Review of Systems  Constitutional: Positive for fatigue and unexpected weight change. Negative for appetite change and fever.  HENT: Negative.   Respiratory: Positive for shortness of breath and wheezing. Negative for chest tightness.   Cardiovascular: Positive for leg swelling.  Gastrointestinal: Negative.   Musculoskeletal: Negative.   Neurological: Negative.   Psychiatric/Behavioral: Negative.        Objective:   Physical Exam Constitutional:      Appearance: Normal appearance.  HENT:     Head: Normocephalic and atraumatic.     Right Ear: External ear normal.     Left Ear: External ear normal.  Eyes:     Pupils: Pupils are equal, round, and reactive to light.  Neck:     Musculoskeletal: Neck supple.  Cardiovascular:     Rate and Rhythm: Normal rate and regular rhythm.     Heart sounds: Normal heart sounds.  Pulmonary:     Effort: Pulmonary effort is normal.     Breath sounds: Wheezing present.     Comments: Scattered bilateral expiratory wheeze noted Abdominal:     General: Bowel sounds are normal. There is no distension.     Palpations: Abdomen is soft.     Tenderness: There is no abdominal tenderness.  Musculoskeletal:        General: Swelling present.   Comments: 2+ bilateral lower extremity pitting edema noted  Lymphadenopathy:     Cervical: No cervical adenopathy.  Neurological:     General: No focal deficit present.     Mental Status: She is alert and oriented to person, place, and time.  Psychiatric:        Mood and Affect: Mood normal.        Behavior: Behavior normal.           Assessment & Plan:  Please see problem discharging for assessment and plan:

## 2018-09-13 NOTE — Assessment & Plan Note (Signed)
-  Patient has a history of chronic diastolic heart failure and was recently switched from Bumex to torsemide by cardiology. -During my televisit with her last week she complained of increasing weight as well as worsening shortness of breath -Patient was contacted by cardiology and was given 2 days of metolazone in addition to her normal torsemide which she states increased her urine output tremendously -Since she completed her 2 days of metolazone she states that her shortness of breath is improved but she still has some persistent shortness of breath, wheezing, dyspnea on minimal exertion and lower extremity swelling. -On Sunday she was contacted by her cardiologist who asked her to increase her torsemide to 40 mg twice a day but patient has yet to do this -Today on exam she is noted to have persistent lower extremity edema as well as scattered expiratory wheezes bilaterally on lung exam -We discussed possible admission today given her persistent shortness of breath but patient would like to try the increased dose of diuretics at home and have close follow-up as an outpatient rather than be admitted today. -We will check chest x-ray today as well -Patient instructed to increase her torsemide to 40 mg twice a day (2 tablets of her torsemide 20 mg twice daily) -We will check BMP today -Patient is being followed by home care in conjunction with cardiology to assess her progress as well -Patient to be referred to advanced heart failure by cardiology -Patient states that she cannot come back for follow-up until next week.  She was instructed that if her shortness of breath is worsening to go to the ED or call us -No further work-up at this time

## 2018-09-14 ENCOUNTER — Telehealth: Payer: Self-pay | Admitting: Internal Medicine

## 2018-09-14 ENCOUNTER — Telehealth: Payer: Self-pay | Admitting: *Deleted

## 2018-09-14 ENCOUNTER — Ambulatory Visit: Payer: Medicare HMO | Admitting: Podiatry

## 2018-09-14 ENCOUNTER — Encounter: Payer: Self-pay | Admitting: Cardiology

## 2018-09-14 DIAGNOSIS — Z794 Long term (current) use of insulin: Secondary | ICD-10-CM

## 2018-09-14 DIAGNOSIS — Z6841 Body Mass Index (BMI) 40.0 and over, adult: Secondary | ICD-10-CM

## 2018-09-14 DIAGNOSIS — E1169 Type 2 diabetes mellitus with other specified complication: Secondary | ICD-10-CM

## 2018-09-14 LAB — BMP8+ANION GAP
Anion Gap: 17 mmol/L (ref 10.0–18.0)
BUN/Creatinine Ratio: 28 (ref 12–28)
BUN: 75 mg/dL — ABNORMAL HIGH (ref 8–27)
CO2: 26 mmol/L (ref 20–29)
Calcium: 9.5 mg/dL (ref 8.7–10.3)
Chloride: 100 mmol/L (ref 96–106)
Creatinine, Ser: 2.71 mg/dL — ABNORMAL HIGH (ref 0.57–1.00)
GFR calc Af Amer: 20 mL/min/{1.73_m2} — ABNORMAL LOW (ref >59)
GFR calc non Af Amer: 18 mL/min/{1.73_m2} — ABNORMAL LOW (ref >59)
Glucose: 152 mg/dL — ABNORMAL HIGH (ref 65–99)
Potassium: 4.6 mmol/L (ref 3.5–5.2)
Sodium: 143 mmol/L (ref 134–144)

## 2018-09-14 LAB — MICROALBUMIN / CREATININE URINE RATIO
Creatinine, Urine: 33 mg/dL
Microalb/Creat Ratio: 61 mg/g creat — ABNORMAL HIGH (ref 0–29)
Microalbumin, Urine: 20.1 ug/mL

## 2018-09-14 NOTE — Telephone Encounter (Signed)
Called the patient, multiple time and she did not answer, unable to leave a voicemail due to it being full.

## 2018-09-14 NOTE — Addendum Note (Signed)
Addended by: Aldine Contes on: 09/14/2018 01:36 PM   Modules accepted: Orders

## 2018-09-14 NOTE — Telephone Encounter (Signed)
Duplicate encounter

## 2018-09-14 NOTE — Telephone Encounter (Signed)
New Message ° ° ° °Pt is returning a call  ° ° °Please call back  °

## 2018-09-14 NOTE — Telephone Encounter (Signed)
Attempts to call patient at both home and Cell number.  Message was left on cell number voice mail.  Call to patient's sister Lorre Nick to have patient call for appointment.  Sister said that she will see patient today and give her the message to call the Clinics.   Patient needs to schedule a follow-up appointment to be seen Tuesday and follow up on PA for Acc-Chex Aviva test strips.   Sander Nephew, RN 09/14/2018 12:19 PM

## 2018-09-14 NOTE — Telephone Encounter (Signed)
I called the patient to discuss results of her BMP and chest x-ray from yesterday.  Patient's creatinine has increased to 2.7 from 1.9.  Dr. Radford Pax, patient's cardiologist, has reviewed these results as well.  We will hold patient's torsemide and metolazone for now.  Patient's chest x-ray shows mild pulmonary edema.  I asked the patient to call cardiologist office as well to set up an appointment to see her.  I also instructed the patient if she has worsening shortness of breath or increased swelling of her legs to give [place] a call so that she can be seen or go to the ED for evaluation if her symptoms are severe.  I also explained to the patient that if her symptoms worsen she may need to be admitted for diuresis and close observation.  Patient expresses understanding and is in agreement with plan.

## 2018-09-15 ENCOUNTER — Ambulatory Visit: Payer: Medicare HMO

## 2018-09-15 NOTE — Progress Notes (Signed)
Patient has since been managed by Dr. Narenda/Dr. Radford Pax per telephone and result notes.

## 2018-09-16 ENCOUNTER — Other Ambulatory Visit: Payer: Self-pay | Admitting: Internal Medicine

## 2018-09-16 DIAGNOSIS — E1169 Type 2 diabetes mellitus with other specified complication: Secondary | ICD-10-CM

## 2018-09-20 ENCOUNTER — Telehealth: Payer: Self-pay

## 2018-09-20 ENCOUNTER — Ambulatory Visit: Payer: Medicare HMO

## 2018-09-20 ENCOUNTER — Telehealth: Payer: Self-pay | Admitting: Physician Assistant

## 2018-09-20 ENCOUNTER — Ambulatory Visit: Payer: Medicare HMO | Admitting: Dietician

## 2018-09-20 NOTE — Telephone Encounter (Signed)
Return pt's call - no answer; mailbox is full unable to leave a message.

## 2018-09-20 NOTE — Telephone Encounter (Signed)
Left message to call back  

## 2018-09-20 NOTE — Telephone Encounter (Signed)
Questions about meds, Please call pt back.

## 2018-09-20 NOTE — Telephone Encounter (Signed)
Pt returned my call and has been placed to see Melina Copa. PA-C, for in-office visit 5/28/202.

## 2018-09-20 NOTE — Telephone Encounter (Signed)
Call placed to pt re: message below from [facility], PA-C, left a message on both pt's contact #'s for pt to call back to get an appt in office today.

## 2018-09-20 NOTE — Telephone Encounter (Signed)
    Last week we had put a placeholder virtual OV appt. In the interim patient saw PCP and Cr had bumped to 2.7 so PCP held diuretics. Dr. Radford Pax advised holding diuretics and seeing DOD ASAP. Notes under the Lab tab reveal that Dr. Theodosia Blender nurse was unable to reach patient. At this point patient definitely needs in office visit to help manage. VOV will not suffice. Please contact patient. If unable to reach try emergency contact. Thanks. Saladin Petrelli PA-C

## 2018-09-21 ENCOUNTER — Telehealth: Payer: Medicare HMO | Admitting: Physician Assistant

## 2018-09-21 NOTE — Telephone Encounter (Signed)
Return pt's call - stated her cardiologist, Dr Radford Pax, took her off Torsemide b/c "my kidneys worse". And she wanted Dr Dareen Piano to be made aware.

## 2018-09-21 NOTE — Telephone Encounter (Signed)
I asked the patient to stop her diuretics last week. Thanks [employer]

## 2018-09-21 NOTE — Progress Notes (Addendum)
Cardiology Office Note    Date:  09/22/2018  ID:  MAYSEN BONSIGNORE, DOB 1951/03/21, MRN [id number] PCP:  Aldine Contes, MD  Cardiologist:  Fransico Him, MD   Chief Complaint: f/u weight gain/CHF  History of Present Illness:  Kathleen [name] is a 68 y.o. female with history of permanent atrial flutter with baseline bradycardia, hypertension, super morbid obesity, IIDDM, dyslipidemia, obesity hypoventilation syndrome (on home O2 with no OSA by prior PSG but showed nocturnal hypoxemia), CKD stage IV, chronic diastolic CHF, chronic edema likely due in part to obesity who is seen for in-person follow-up.   Last echo 04/2016 showed EF 55-60%, mild MR, mild LAE/RAE. She previously was trialed on higher dose of Bumex but creatinine bumped so this was decreased. In 05/2018 she was switched to torsemide (due to still having 3+ edema on exam). By Lacey Jensen 5/13 the patient reported increased weight gain.. She reports her usual weight is in the 260's but she has not been that low in several years. She has had an upward trend gradually over the last 4 years. She was 282 in the office in 05/2018, but more recently in the low 300s. When [facility] went out she had increased to 308. Due to weight gain, metolazone was added x 2 days without significant improvement so torsemide was increased to 13m BID. She saw primary care 5/19 who noted persistent lower extremity edema and wheezing. CXR showed mild CHF. Her labs 5/19 showed a rise in BUN/Cr to 75/2.71 (K 4.6) so she was asked to hold both diuretics and get an in-person cardiology OV. Unfortunately the patient did not return our calls and did not begin holding med until yesterday (5/27).   She is seen in person for follow-up today and appears volume overloaded. Her weight is at 301 today. She reports orthopnea, increased DOE, and increased LEE which is now uncomfortable physically. Last week her legs were weeping and had split open in some places. She did not bring her  O2 today but has been trying to use it more at home - it used to be just with exertion and overnight but now she is requiring it at rest. Nurse noted some confusion although she currently appears clear. Sister brought her. Transportation has been a problem in general. She denies any CP.  She does report good UOP. She also reports a sharp pain in her left flank yesterday. No fevers, chills, nausea, vomiting.    Past Medical History:  Diagnosis Date  . Atrial flutter ([facility]    a. permanent.  . Bradycardia   . Chronic diastolic (congestive) heart failure ([facility] 01/10/2015  . CKD (chronic kidney disease), stage IV ([facility]   . Degenerative joint disease of hand   . Diabetes mellitus   . Dyslipidemia   . Fecal occult blood test positive   . GERD (gastroesophageal reflux disease)   . Headache   . Hypertension   . Inadequate material resources   . Irritable bowel syndrome   . Morbid obesity ([facility]   . Obesity hypoventilation syndrome ([facility] [place]   . Post-menopausal bleeding   . Shortness of breath dyspnea    multifactorial from obesity, deconditioning, obesity hypoventilation syndrome    Past Surgical History:  Procedure Laterality Date  . CHOLECYSTECTOMY    . EYE SURGERY     left lens implant s/p cataracts  . OTHER SURGICAL HISTORY     right shoulder tendon repair 05/2011    Current Medications: Current Meds  Medication Sig  . ACCU-CHEK  AVIVA PLUS test strip TEST BLOOD SUGAR THREE TIMES DAILY  . Accu-Chek FastClix Lancets MISC TEST BLOOD SUGAR THREE TIMES DAILY  . Accu-Chek Softclix Lancets lancets TEST BLOOD SUGAR THREE TIMES DAILY  . apixaban (ELIQUIS) 5 MG TABS tablet Take 1 tablet (5 mg total) by mouth 2 (two) times daily.  Marland Kitchen atorvastatin (LIPITOR) 40 MG tablet Take 1 tablet (40 mg total) by mouth daily.  . Blood Glucose Monitoring Suppl (ACCU-CHEK AVIVA PLUS) w/Device KIT TEST BLOOD SUGAR THREE TIMES DAILY  . gabapentin (NEURONTIN) 300 MG capsule TAKE 1 CAPSULE EVERY MORNING, TAKE 1  CAPSULE IN THE AFTERNOON, AND TAKE 2 CAPSULES EVERY NIGHT  . insulin detemir (LEVEMIR) 100 UNIT/ML injection Inject 65 units total into the skin once daily.  . Insulin Pen Needle (B-D UF III MINI PEN NEEDLES) 31G X 5 MM MISC USE ONE TIME DAILY TO INJECT VICTOZA. DX:E11.9  . Insulin Syringe-Needle U-100 (BD VEO INSULIN SYRINGE U/F) 31G X 15/64" 1 ML MISC USE TO INJECT INSULIN ONE TIME A DAY  . liraglutide (VICTOZA) 18 MG/3ML SOPN Inject 0.3 mLs (1.8 mg total) into the skin daily.  . metFORMIN (GLUCOPHAGE) 500 MG tablet Take 1 tablet (500 mg total) by mouth 2 (two) times daily with a meal.  . Multiple Vitamins-Minerals (HEALTHY EYES PO) Take 1 tablet by mouth daily. Reported on 05/17/2015  . NON FORMULARY Place 2 L into the nose daily. Wears with exertion and qhs     Allergies:   Doxycycline   Social History   Socioeconomic History  . Marital status: Divorced    Spouse name: Not on file  . Number of children: 2  . Years of education: Not on file  . Highest education level: Not on file  Occupational History  . Occupation: unemployed    Fish farm manager: UNEMPLOYED  Social Needs  . Financial resource strain: Not on file  . Food insecurity:    Worry: Not on file    Inability: Not on file  . Transportation needs:    Medical: Not on file    Non-medical: Not on file  Tobacco Use  . Smoking status: Never Smoker  . Smokeless tobacco: Never Used  Substance and Sexual Activity  . Alcohol use: No    Alcohol/week: 0.0 standard drinks  . Drug use: No  . Sexual activity: Not Currently  Lifestyle  . Physical activity:    Days per week: Not on file    Minutes per session: Not on file  . Stress: Not on file  Relationships  . Social connections:    Talks on phone: Not on file    Gets together: Not on file    Attends religious service: Not on file    Active member of club or organization: Not on file    Attends meetings of clubs or organizations: Not on file    Relationship status: Not on file   Other Topics Concern  . Not on file  Social History Narrative   Single.   Divorced x2.   Has 2 children, by two different fathers.   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 May apply for disability/SSI given her pain in her hands which has made interviewing for jobs difficult.   Never Smoked   Alcohol use-no   Drug use-no   Regular exercise-yes      Financial assistance approved for 100% discount at [facility] and has [facility] card per [employer]  02/18/2010              Family History:  The patient's family history includes Diabetes in her mother and sister; Obesity in her mother; Stroke in her father. There is no history of Colon cancer.  ROS:   Please see the history of present illness.  All other systems are reviewed and otherwise negative.    PHYSICAL EXAM:   VS:  BP 126/62   Pulse (!) 57   Ht 5' (1.524 m)   Wt (!) 301 lb 1.9 oz (136.6 kg)   SpO2 (!) 84%   BMI 58.81 kg/m   BMI: Body mass index is 58.81 kg/m. GEN: Morbidly obese WF in no acute distress HEENT: normocephalic, atraumatic Neck: no JVD, carotid bruits, or masses Cardiac: slightly irregular rhythm HR controlled; no murmurs, rubs, or gallops, no edema  Respiratory:  Diminished BS throughout. No wheezing or rhonchi GI: soft, nontender, obese pannus, + BS MS: no deformity or atrophy Skin: 3+ BLE edema, tight, with mild erythema bilaterally superimposed on large baseline leg habitus Neuro:  Alert and Oriented x 3, Strength and sensation are intact, follows command Psych: euthymic mood, full affect  Wt Readings from Last 3 Encounters:  09/22/18 (!) 301 lb 1.9 oz (136.6 kg)  09/13/18 (!) 303 lb 8 oz (137.7 kg)  09/07/18 (!) 301 lb (136.5 kg)      Studies/Labs Reviewed:   EKG:  EKG was ordered today and personally reviewed by me and demonstrates atrial flutter with slow ventricular response similar to prior, no acute changes. F/u EKG  to check lead placement shows ventricular trigeminy.   Recent Labs: 06/17/2018: Hemoglobin 11.5; Platelets 135 09/13/2018: BUN 75; Creatinine, Ser 2.71; Potassium 4.6; Sodium 143   Lipid Panel    Component Value Date/Time   CHOL 66 (L) 06/07/2018 1154   TRIG 60 06/07/2018 1154   HDL 30 (L) 06/07/2018 1154   CHOLHDL 2.2 06/07/2018 1154   CHOLHDL 3.5 06/07/2014 1027   VLDL 16 06/07/2014 1027   LDLCALC 24 06/07/2018 1154    Additional studies/ records that were reviewed today include: Summarized above   ASSESSMENT & PLAN:   1. Acute on chronic diastolic CHF - has failed outpatient diuretic adjustment complicated by underlying CKD. This has been made challenging by transportation issues as well. Patient seen with Dr. Irish Lack. Her volume status is rather challenging to assess in general as she likely has some degree of lymphedema related to her morbid obesity. However, she also has worsening SOB, orthopnea, and a worsening O2 requirement. We will plan to admit and start IV Lasix 73m BID along with checking labs. May benefit from nephrology consult in the hospital. Will update 2D echocardiogram. If orthopnea resolves to the point she can lie flat, would benefit from right heart catheterization during admission to more confidently assess volume status. Will order UNNA boots. Would also consider repeat sleep study as outpatient to exclude development of full OSA once Covid pandemic settles down. 2. Acute on chronic respiratory failure - Likely needs home O2 around the clock at this point instead of just with exertion/QHS, but can evaluate for home disposition closer to discharge. CXR last week with CHF.  3. AKI on CKD stage IV - plan renal [place]. Obtain UA and albumin. Check renal function. Consider nephrology consultation. 4. Morbid obesity - likely the crux of her issues and will only perpetuate her chronic issues. She understands the dire need to lose weight. 5. Permanent atrial  flutter -  EKG appears similar to prior. Continue Eliquis for now. Rate remains similar to prior. Not on any AVN blocking agents. 6. DM - add SSI to home regimen with CBG checks. 7. Falls - noted during OP [facility] visit. Will get PT to see while inpatient to assess home health needs.  Disposition: F/u TBD post hospitalization.  Medication Adjustments/Labs and Tests Ordered: Current medicines are reviewed at length with the patient today.  Concerns regarding medicines are outlined above. Medication changes, Labs and Tests ordered today are summarized above and listed in the Patient Instructions accessible in Encounters.   Signed, Charlie Pitter, PA-C  09/22/2018 11:57 AM    [address], [place], [place]  [address] Phone: [phone]; Fax: [phone]

## 2018-09-22 ENCOUNTER — Encounter: Payer: Self-pay | Admitting: Physician Assistant

## 2018-09-22 ENCOUNTER — Other Ambulatory Visit: Payer: Self-pay

## 2018-09-22 ENCOUNTER — Inpatient Hospital Stay (HOSPITAL_COMMUNITY): Payer: Medicare HMO

## 2018-09-22 ENCOUNTER — Ambulatory Visit (INDEPENDENT_AMBULATORY_CARE_PROVIDER_SITE_OTHER): Payer: Medicare HMO | Admitting: Physician Assistant

## 2018-09-22 ENCOUNTER — Inpatient Hospital Stay (HOSPITAL_COMMUNITY)
Admission: AD | Admit: 2018-09-22 | Discharge: 2018-11-17 | DRG: 003 | Disposition: A | Discharge status: Other Acute Inpatient Hospital | Payer: Medicare HMO | Source: Ambulatory Visit | Attending: Family Medicine | Admitting: Family Medicine

## 2018-09-22 ENCOUNTER — Encounter (INDEPENDENT_AMBULATORY_CARE_PROVIDER_SITE_OTHER): Payer: Medicare HMO | Admitting: Ophthalmology

## 2018-09-22 ENCOUNTER — Encounter (HOSPITAL_COMMUNITY): Payer: Self-pay | Admitting: General Practice

## 2018-09-22 VITALS — BP 126/62 | HR 57 | Ht 60.0 in | Wt 301.1 lb

## 2018-09-22 DIAGNOSIS — I959 Hypotension, unspecified: Secondary | ICD-10-CM | POA: Diagnosis not present

## 2018-09-22 DIAGNOSIS — Z6841 Body Mass Index (BMI) 40.0 and over, adult: Secondary | ICD-10-CM

## 2018-09-22 DIAGNOSIS — E875 Hyperkalemia: Secondary | ICD-10-CM | POA: Diagnosis present

## 2018-09-22 DIAGNOSIS — N189 Chronic kidney disease, unspecified: Secondary | ICD-10-CM | POA: Diagnosis not present

## 2018-09-22 DIAGNOSIS — J9602 Acute respiratory failure with hypercapnia: Secondary | ICD-10-CM | POA: Diagnosis not present

## 2018-09-22 DIAGNOSIS — E46 Unspecified protein-calorie malnutrition: Secondary | ICD-10-CM | POA: Diagnosis not present

## 2018-09-22 DIAGNOSIS — I5033 Acute on chronic diastolic (congestive) heart failure: Secondary | ICD-10-CM

## 2018-09-22 DIAGNOSIS — I132 Hypertensive heart and chronic kidney disease with heart failure and with stage 5 chronic kidney disease, or end stage renal disease: Secondary | ICD-10-CM | POA: Diagnosis not present

## 2018-09-22 DIAGNOSIS — Z95828 Presence of other vascular implants and grafts: Secondary | ICD-10-CM

## 2018-09-22 DIAGNOSIS — J9600 Acute respiratory failure, unspecified whether with hypoxia or hypercapnia: Secondary | ICD-10-CM

## 2018-09-22 DIAGNOSIS — Z978 Presence of other specified devices: Secondary | ICD-10-CM

## 2018-09-22 DIAGNOSIS — I5032 Chronic diastolic (congestive) heart failure: Secondary | ICD-10-CM

## 2018-09-22 DIAGNOSIS — N186 End stage renal disease: Secondary | ICD-10-CM | POA: Diagnosis not present

## 2018-09-22 DIAGNOSIS — I495 Sick sinus syndrome: Secondary | ICD-10-CM | POA: Diagnosis not present

## 2018-09-22 DIAGNOSIS — Z794 Long term (current) use of insulin: Secondary | ICD-10-CM

## 2018-09-22 DIAGNOSIS — J9501 Hemorrhage from tracheostomy stoma: Secondary | ICD-10-CM | POA: Diagnosis not present

## 2018-09-22 DIAGNOSIS — Y848 Other medical procedures as the cause of abnormal reaction of the patient, or of later complication, without mention of misadventure at the time of the procedure: Secondary | ICD-10-CM | POA: Diagnosis not present

## 2018-09-22 DIAGNOSIS — E1122 Type 2 diabetes mellitus with diabetic chronic kidney disease: Secondary | ICD-10-CM | POA: Diagnosis not present

## 2018-09-22 DIAGNOSIS — I5082 Biventricular heart failure: Secondary | ICD-10-CM | POA: Diagnosis present

## 2018-09-22 DIAGNOSIS — Z930 Tracheostomy status: Secondary | ICD-10-CM | POA: Diagnosis not present

## 2018-09-22 DIAGNOSIS — D509 Iron deficiency anemia, unspecified: Secondary | ICD-10-CM | POA: Diagnosis present

## 2018-09-22 DIAGNOSIS — J9601 Acute respiratory failure with hypoxia: Secondary | ICD-10-CM

## 2018-09-22 DIAGNOSIS — J9621 Acute and chronic respiratory failure with hypoxia: Secondary | ICD-10-CM | POA: Diagnosis not present

## 2018-09-22 DIAGNOSIS — J95851 Ventilator associated pneumonia: Secondary | ICD-10-CM | POA: Diagnosis not present

## 2018-09-22 DIAGNOSIS — Z7901 Long term (current) use of anticoagulants: Secondary | ICD-10-CM

## 2018-09-22 DIAGNOSIS — E662 Morbid (severe) obesity with alveolar hypoventilation: Secondary | ICD-10-CM | POA: Diagnosis present

## 2018-09-22 DIAGNOSIS — R680 Hypothermia, not associated with low environmental temperature: Secondary | ICD-10-CM | POA: Diagnosis not present

## 2018-09-22 DIAGNOSIS — R062 Wheezing: Secondary | ICD-10-CM | POA: Diagnosis not present

## 2018-09-22 DIAGNOSIS — Z9289 Personal history of other medical treatment: Secondary | ICD-10-CM

## 2018-09-22 DIAGNOSIS — J9620 Acute and chronic respiratory failure, unspecified whether with hypoxia or hypercapnia: Secondary | ICD-10-CM | POA: Diagnosis not present

## 2018-09-22 DIAGNOSIS — J811 Chronic pulmonary edema: Secondary | ICD-10-CM

## 2018-09-22 DIAGNOSIS — E8779 Other fluid overload: Secondary | ICD-10-CM | POA: Diagnosis not present

## 2018-09-22 DIAGNOSIS — I272 Pulmonary hypertension, unspecified: Secondary | ICD-10-CM | POA: Diagnosis present

## 2018-09-22 DIAGNOSIS — K5900 Constipation, unspecified: Secondary | ICD-10-CM | POA: Diagnosis not present

## 2018-09-22 DIAGNOSIS — Z713 Dietary counseling and surveillance: Secondary | ICD-10-CM

## 2018-09-22 DIAGNOSIS — N939 Abnormal uterine and vaginal bleeding, unspecified: Secondary | ICD-10-CM | POA: Diagnosis not present

## 2018-09-22 DIAGNOSIS — E1169 Type 2 diabetes mellitus with other specified complication: Secondary | ICD-10-CM | POA: Diagnosis not present

## 2018-09-22 DIAGNOSIS — Z9981 Dependence on supplemental oxygen: Secondary | ICD-10-CM

## 2018-09-22 DIAGNOSIS — I469 Cardiac arrest, cause unspecified: Secondary | ICD-10-CM

## 2018-09-22 DIAGNOSIS — Z9181 History of falling: Secondary | ICD-10-CM

## 2018-09-22 DIAGNOSIS — I509 Heart failure, unspecified: Secondary | ICD-10-CM | POA: Diagnosis not present

## 2018-09-22 DIAGNOSIS — J810 Acute pulmonary edema: Secondary | ICD-10-CM | POA: Diagnosis not present

## 2018-09-22 DIAGNOSIS — K7689 Other specified diseases of liver: Secondary | ICD-10-CM | POA: Diagnosis not present

## 2018-09-22 DIAGNOSIS — I4821 Permanent atrial fibrillation: Secondary | ICD-10-CM | POA: Diagnosis not present

## 2018-09-22 DIAGNOSIS — I2781 Cor pulmonale (chronic): Secondary | ICD-10-CM | POA: Diagnosis present

## 2018-09-22 DIAGNOSIS — R579 Shock, unspecified: Secondary | ICD-10-CM

## 2018-09-22 DIAGNOSIS — I5041 Acute combined systolic (congestive) and diastolic (congestive) heart failure: Secondary | ICD-10-CM | POA: Diagnosis not present

## 2018-09-22 DIAGNOSIS — R0902 Hypoxemia: Secondary | ICD-10-CM

## 2018-09-22 DIAGNOSIS — J9612 Chronic respiratory failure with hypercapnia: Secondary | ICD-10-CM | POA: Diagnosis not present

## 2018-09-22 DIAGNOSIS — R570 Cardiogenic shock: Secondary | ICD-10-CM | POA: Diagnosis not present

## 2018-09-22 DIAGNOSIS — R1310 Dysphagia, unspecified: Secondary | ICD-10-CM | POA: Diagnosis not present

## 2018-09-22 DIAGNOSIS — J9690 Respiratory failure, unspecified, unspecified whether with hypoxia or hypercapnia: Secondary | ICD-10-CM

## 2018-09-22 DIAGNOSIS — G9349 Other encephalopathy: Secondary | ICD-10-CM | POA: Diagnosis not present

## 2018-09-22 DIAGNOSIS — N179 Acute kidney failure, unspecified: Secondary | ICD-10-CM

## 2018-09-22 DIAGNOSIS — Z1159 Encounter for screening for other viral diseases: Secondary | ICD-10-CM | POA: Diagnosis not present

## 2018-09-22 DIAGNOSIS — R918 Other nonspecific abnormal finding of lung field: Secondary | ICD-10-CM | POA: Diagnosis not present

## 2018-09-22 DIAGNOSIS — Z7189 Other specified counseling: Secondary | ICD-10-CM | POA: Diagnosis not present

## 2018-09-22 DIAGNOSIS — E871 Hypo-osmolality and hyponatremia: Secondary | ICD-10-CM | POA: Diagnosis present

## 2018-09-22 DIAGNOSIS — Z560 Unemployment, unspecified: Secondary | ICD-10-CM

## 2018-09-22 DIAGNOSIS — I953 Hypotension of hemodialysis: Secondary | ICD-10-CM | POA: Diagnosis not present

## 2018-09-22 DIAGNOSIS — R0602 Shortness of breath: Secondary | ICD-10-CM | POA: Diagnosis not present

## 2018-09-22 DIAGNOSIS — D696 Thrombocytopenia, unspecified: Secondary | ICD-10-CM | POA: Diagnosis not present

## 2018-09-22 DIAGNOSIS — Y95 Nosocomial condition: Secondary | ICD-10-CM | POA: Diagnosis not present

## 2018-09-22 DIAGNOSIS — R34 Anuria and oliguria: Secondary | ICD-10-CM | POA: Diagnosis not present

## 2018-09-22 DIAGNOSIS — E785 Hyperlipidemia, unspecified: Secondary | ICD-10-CM | POA: Diagnosis present

## 2018-09-22 DIAGNOSIS — E669 Obesity, unspecified: Secondary | ICD-10-CM | POA: Diagnosis present

## 2018-09-22 DIAGNOSIS — D631 Anemia in chronic kidney disease: Secondary | ICD-10-CM | POA: Diagnosis present

## 2018-09-22 DIAGNOSIS — N183 Chronic kidney disease, stage 3 (moderate): Secondary | ICD-10-CM | POA: Diagnosis not present

## 2018-09-22 DIAGNOSIS — R001 Bradycardia, unspecified: Secondary | ICD-10-CM | POA: Diagnosis not present

## 2018-09-22 DIAGNOSIS — I5042 Chronic combined systolic (congestive) and diastolic (congestive) heart failure: Secondary | ICD-10-CM | POA: Diagnosis not present

## 2018-09-22 DIAGNOSIS — J156 Pneumonia due to other aerobic Gram-negative bacteria: Secondary | ICD-10-CM | POA: Diagnosis not present

## 2018-09-22 DIAGNOSIS — Z9911 Dependence on respirator [ventilator] status: Secondary | ICD-10-CM | POA: Diagnosis not present

## 2018-09-22 DIAGNOSIS — Z430 Encounter for attention to tracheostomy: Secondary | ICD-10-CM | POA: Diagnosis not present

## 2018-09-22 DIAGNOSIS — N2889 Other specified disorders of kidney and ureter: Secondary | ICD-10-CM | POA: Diagnosis not present

## 2018-09-22 DIAGNOSIS — Z9049 Acquired absence of other specified parts of digestive tract: Secondary | ICD-10-CM

## 2018-09-22 DIAGNOSIS — F05 Delirium due to known physiological condition: Secondary | ICD-10-CM | POA: Diagnosis not present

## 2018-09-22 DIAGNOSIS — Z452 Encounter for adjustment and management of vascular access device: Secondary | ICD-10-CM | POA: Diagnosis not present

## 2018-09-22 DIAGNOSIS — I484 Atypical atrial flutter: Secondary | ICD-10-CM | POA: Diagnosis not present

## 2018-09-22 DIAGNOSIS — J189 Pneumonia, unspecified organism: Secondary | ICD-10-CM | POA: Diagnosis not present

## 2018-09-22 DIAGNOSIS — Z9119 Patient's noncompliance with other medical treatment and regimen: Secondary | ICD-10-CM

## 2018-09-22 DIAGNOSIS — K589 Irritable bowel syndrome without diarrhea: Secondary | ICD-10-CM | POA: Diagnosis present

## 2018-09-22 DIAGNOSIS — I5043 Acute on chronic combined systolic (congestive) and diastolic (congestive) heart failure: Secondary | ICD-10-CM | POA: Diagnosis not present

## 2018-09-22 DIAGNOSIS — I482 Chronic atrial fibrillation, unspecified: Secondary | ICD-10-CM | POA: Diagnosis not present

## 2018-09-22 DIAGNOSIS — Z01818 Encounter for other preprocedural examination: Secondary | ICD-10-CM | POA: Diagnosis not present

## 2018-09-22 DIAGNOSIS — E11649 Type 2 diabetes mellitus with hypoglycemia without coma: Secondary | ICD-10-CM | POA: Diagnosis not present

## 2018-09-22 DIAGNOSIS — R0600 Dyspnea, unspecified: Secondary | ICD-10-CM

## 2018-09-22 DIAGNOSIS — I130 Hypertensive heart and chronic kidney disease with heart failure and stage 1 through stage 4 chronic kidney disease, or unspecified chronic kidney disease: Secondary | ICD-10-CM | POA: Diagnosis not present

## 2018-09-22 DIAGNOSIS — Z0189 Encounter for other specified special examinations: Secondary | ICD-10-CM

## 2018-09-22 DIAGNOSIS — J15211 Pneumonia due to Methicillin susceptible Staphylococcus aureus: Secondary | ICD-10-CM | POA: Diagnosis not present

## 2018-09-22 DIAGNOSIS — Z79899 Other long term (current) drug therapy: Secondary | ICD-10-CM

## 2018-09-22 DIAGNOSIS — J0410 Acute tracheitis without obstruction: Secondary | ICD-10-CM | POA: Diagnosis not present

## 2018-09-22 DIAGNOSIS — K219 Gastro-esophageal reflux disease without esophagitis: Secondary | ICD-10-CM | POA: Diagnosis present

## 2018-09-22 DIAGNOSIS — Z515 Encounter for palliative care: Secondary | ICD-10-CM

## 2018-09-22 DIAGNOSIS — Z5320 Procedure and treatment not carried out because of patient's decision for unspecified reasons: Secondary | ICD-10-CM | POA: Diagnosis not present

## 2018-09-22 DIAGNOSIS — I5023 Acute on chronic systolic (congestive) heart failure: Secondary | ICD-10-CM | POA: Diagnosis not present

## 2018-09-22 DIAGNOSIS — G9340 Encephalopathy, unspecified: Secondary | ICD-10-CM | POA: Diagnosis not present

## 2018-09-22 DIAGNOSIS — Z4682 Encounter for fitting and adjustment of non-vascular catheter: Secondary | ICD-10-CM | POA: Diagnosis not present

## 2018-09-22 DIAGNOSIS — E872 Acidosis: Secondary | ICD-10-CM | POA: Diagnosis not present

## 2018-09-22 DIAGNOSIS — J9611 Chronic respiratory failure with hypoxia: Secondary | ICD-10-CM | POA: Diagnosis not present

## 2018-09-22 DIAGNOSIS — N170 Acute kidney failure with tubular necrosis: Secondary | ICD-10-CM | POA: Diagnosis not present

## 2018-09-22 DIAGNOSIS — I120 Hypertensive chronic kidney disease with stage 5 chronic kidney disease or end stage renal disease: Secondary | ICD-10-CM | POA: Diagnosis not present

## 2018-09-22 DIAGNOSIS — J984 Other disorders of lung: Secondary | ICD-10-CM | POA: Diagnosis not present

## 2018-09-22 DIAGNOSIS — I472 Ventricular tachycardia: Secondary | ICD-10-CM | POA: Diagnosis not present

## 2018-09-22 DIAGNOSIS — I2721 Secondary pulmonary arterial hypertension: Secondary | ICD-10-CM | POA: Diagnosis not present

## 2018-09-22 DIAGNOSIS — J9622 Acute and chronic respiratory failure with hypercapnia: Secondary | ICD-10-CM | POA: Diagnosis not present

## 2018-09-22 DIAGNOSIS — I2510 Atherosclerotic heart disease of native coronary artery without angina pectoris: Secondary | ICD-10-CM | POA: Diagnosis present

## 2018-09-22 DIAGNOSIS — I4892 Unspecified atrial flutter: Secondary | ICD-10-CM | POA: Diagnosis not present

## 2018-09-22 DIAGNOSIS — Z789 Other specified health status: Secondary | ICD-10-CM

## 2018-09-22 DIAGNOSIS — E1165 Type 2 diabetes mellitus with hyperglycemia: Secondary | ICD-10-CM | POA: Diagnosis not present

## 2018-09-22 DIAGNOSIS — N184 Chronic kidney disease, stage 4 (severe): Secondary | ICD-10-CM | POA: Diagnosis not present

## 2018-09-22 DIAGNOSIS — Z4901 Encounter for fitting and adjustment of extracorporeal dialysis catheter: Secondary | ICD-10-CM | POA: Diagnosis not present

## 2018-09-22 DIAGNOSIS — R32 Unspecified urinary incontinence: Secondary | ICD-10-CM | POA: Diagnosis not present

## 2018-09-22 DIAGNOSIS — I129 Hypertensive chronic kidney disease with stage 1 through stage 4 chronic kidney disease, or unspecified chronic kidney disease: Secondary | ICD-10-CM | POA: Diagnosis not present

## 2018-09-22 DIAGNOSIS — E8770 Fluid overload, unspecified: Secondary | ICD-10-CM | POA: Diagnosis not present

## 2018-09-22 DIAGNOSIS — Z881 Allergy status to other antibiotic agents status: Secondary | ICD-10-CM

## 2018-09-22 DIAGNOSIS — I442 Atrioventricular block, complete: Secondary | ICD-10-CM | POA: Diagnosis not present

## 2018-09-22 DIAGNOSIS — J90 Pleural effusion, not elsewhere classified: Secondary | ICD-10-CM | POA: Diagnosis not present

## 2018-09-22 DIAGNOSIS — I10 Essential (primary) hypertension: Secondary | ICD-10-CM | POA: Diagnosis not present

## 2018-09-22 DIAGNOSIS — I517 Cardiomegaly: Secondary | ICD-10-CM | POA: Diagnosis not present

## 2018-09-22 DIAGNOSIS — Z992 Dependence on renal dialysis: Secondary | ICD-10-CM

## 2018-09-22 DIAGNOSIS — Z4659 Encounter for fitting and adjustment of other gastrointestinal appliance and device: Secondary | ICD-10-CM

## 2018-09-22 LAB — COMPREHENSIVE METABOLIC PANEL
ALT: 13 U/L (ref 0–44)
AST: 18 U/L (ref 15–41)
Albumin: 3.2 g/dL — ABNORMAL LOW (ref 3.5–5.0)
Alkaline Phosphatase: 127 U/L — ABNORMAL HIGH (ref 38–126)
Anion gap: 10 (ref 5–15)
BUN: 76 mg/dL — ABNORMAL HIGH (ref 8–23)
CO2: 29 mmol/L (ref 22–32)
Calcium: 9 mg/dL (ref 8.9–10.3)
Chloride: 101 mmol/L (ref 98–111)
Creatinine, Ser: 2.84 mg/dL — ABNORMAL HIGH (ref 0.44–1.00)
GFR calc Af Amer: 19 mL/min — ABNORMAL LOW (ref >60)
GFR calc non Af Amer: 17 mL/min — ABNORMAL LOW (ref >60)
Glucose, Bld: 153 mg/dL — ABNORMAL HIGH (ref 70–99)
Potassium: 3.9 mmol/L (ref 3.5–5.1)
Sodium: 140 mmol/L (ref 135–145)
Total Bilirubin: 1.2 mg/dL (ref 0.3–1.2)
Total Protein: 7.2 g/dL (ref 6.5–8.1)

## 2018-09-22 LAB — CBC WITH DIFFERENTIAL/PLATELET
Abs Immature Granulocytes: 0.02 10*3/uL (ref 0.00–0.07)
Basophils Absolute: 0 10*3/uL (ref 0.0–0.1)
Basophils Relative: 1 %
Eosinophils Absolute: 0.2 10*3/uL (ref 0.0–0.5)
Eosinophils Relative: 3 %
HCT: 38 % (ref 36.0–46.0)
Hemoglobin: 10.9 g/dL — ABNORMAL LOW (ref 12.0–15.0)
Immature Granulocytes: 0 %
Lymphocytes Relative: 19 %
Lymphs Abs: 1.1 10*3/uL (ref 0.7–4.0)
MCH: 24.4 pg — ABNORMAL LOW (ref 26.0–34.0)
MCHC: 28.7 g/dL — ABNORMAL LOW (ref 30.0–36.0)
MCV: 85.2 fL (ref 80.0–100.0)
Monocytes Absolute: 0.5 10*3/uL (ref 0.1–1.0)
Monocytes Relative: 9 %
Neutro Abs: 3.9 10*3/uL (ref 1.7–7.7)
Neutrophils Relative %: 68 %
Platelets: 133 10*3/uL — ABNORMAL LOW (ref 150–400)
RBC: 4.46 MIL/uL (ref 3.87–5.11)
RDW: 17.8 % — ABNORMAL HIGH (ref 11.5–15.5)
WBC: 5.7 10*3/uL (ref 4.0–10.5)
nRBC: 0 % (ref 0.0–0.2)

## 2018-09-22 LAB — GLUCOSE, CAPILLARY
Glucose-Capillary: 178 mg/dL — ABNORMAL HIGH (ref 70–99)
Glucose-Capillary: 202 mg/dL — ABNORMAL HIGH (ref 70–99)

## 2018-09-22 LAB — BRAIN NATRIURETIC PEPTIDE: B Natriuretic Peptide: 451.2 pg/mL — ABNORMAL HIGH (ref 0.0–100.0)

## 2018-09-22 LAB — TSH: TSH: 7.226 u[IU]/mL — ABNORMAL HIGH (ref 0.350–4.500)

## 2018-09-22 LAB — SARS CORONAVIRUS 2 BY RT PCR (HOSPITAL ORDER, PERFORMED IN ~~LOC~~ HOSPITAL LAB): SARS Coronavirus 2: NEGATIVE

## 2018-09-22 LAB — ECHOCARDIOGRAM COMPLETE
Height: 60 in
Weight: 4798.4 oz

## 2018-09-22 LAB — MRSA PCR SCREENING: MRSA by PCR: NEGATIVE

## 2018-09-22 LAB — MAGNESIUM: Magnesium: 2.4 mg/dL (ref 1.7–2.4)

## 2018-09-22 MED ORDER — SODIUM CHLORIDE 0.9% FLUSH
3.0000 mL | Freq: Two times a day (BID) | INTRAVENOUS | Status: DC
  Administered 2018-09-22 – 2018-09-29 (×6): 3 mL via INTRAVENOUS

## 2018-09-22 MED ORDER — SODIUM CHLORIDE 0.9% FLUSH: 3.0000 mL | INTRAVENOUS | Status: DC | PRN

## 2018-09-22 MED ORDER — GABAPENTIN 300 MG PO CAPS
300.0000 mg | ORAL_CAPSULE | Freq: Two times a day (BID) | ORAL | Status: DC
  Administered 2018-09-23 – 2018-09-25 (×3): 300 mg via ORAL
  Filled 2018-09-22 (×3): qty 1

## 2018-09-22 MED ORDER — ATORVASTATIN CALCIUM 40 MG PO TABS
40.0000 mg | ORAL_TABLET | Freq: Every day | ORAL | Status: DC
  Administered 2018-09-22 – 2018-10-28 (×34): 40 mg via ORAL
  Filled 2018-09-22 (×34): qty 1

## 2018-09-22 MED ORDER — APIXABAN 5 MG PO TABS
5.0000 mg | ORAL_TABLET | Freq: Two times a day (BID) | ORAL | Status: DC
  Administered 2018-09-22 – 2018-09-23 (×3): 5 mg via ORAL
  Filled 2018-09-22 (×3): qty 1

## 2018-09-22 MED ORDER — FUROSEMIDE 10 MG/ML IJ SOLN
80.0000 mg | Freq: Two times a day (BID) | INTRAMUSCULAR | Status: DC
  Administered 2018-09-22: 80 mg via INTRAVENOUS
  Filled 2018-09-22: qty 8

## 2018-09-22 MED ORDER — ACETAMINOPHEN 325 MG PO TABS: 650.0000 mg | ORAL_TABLET | ORAL | Status: DC | PRN

## 2018-09-22 MED ORDER — SODIUM CHLORIDE 0.9 % IV SOLN: 250.0000 mL | INTRAVENOUS | Status: DC | PRN

## 2018-09-22 MED ORDER — LIRAGLUTIDE 18 MG/3ML ~~LOC~~ SOPN: 1.8000 mg | PEN_INJECTOR | Freq: Every day | SUBCUTANEOUS | Status: DC

## 2018-09-22 MED ORDER — ONDANSETRON HCL 4 MG/2ML IJ SOLN: 4.0000 mg | Freq: Four times a day (QID) | INTRAMUSCULAR | Status: DC | PRN

## 2018-09-22 MED ORDER — GABAPENTIN 300 MG PO CAPS
600.0000 mg | ORAL_CAPSULE | Freq: Every day | ORAL | Status: DC
  Administered 2018-09-22 – 2018-09-23 (×2): 600 mg via ORAL
  Filled 2018-09-22 (×2): qty 2

## 2018-09-22 MED ORDER — INSULIN DETEMIR 100 UNIT/ML ~~LOC~~ SOLN
65.0000 [IU] | Freq: Every day | SUBCUTANEOUS | Status: DC
  Administered 2018-09-23: 14:00:00 65 [IU] via SUBCUTANEOUS
  Administered 2018-09-24: 14:00:00 33 [IU] via SUBCUTANEOUS
  Administered 2018-09-25 – 2018-09-28 (×4): 65 [IU] via SUBCUTANEOUS
  Filled 2018-09-22 (×9): qty 0.65

## 2018-09-22 MED ORDER — INSULIN ASPART 100 UNIT/ML ~~LOC~~ SOLN
0.0000 [IU] | Freq: Three times a day (TID) | SUBCUTANEOUS | Status: DC
  Administered 2018-09-22 – 2018-09-23 (×2): 2 [IU] via SUBCUTANEOUS
  Administered 2018-09-23: 14:00:00 1 [IU] via SUBCUTANEOUS
  Administered 2018-09-23: 19:00:00 2 [IU] via SUBCUTANEOUS
  Administered 2018-09-24 (×2): 3 [IU] via SUBCUTANEOUS
  Administered 2018-09-24: 09:00:00 2 [IU] via SUBCUTANEOUS

## 2018-09-22 NOTE — Patient Instructions (Signed)
You are being direct admitted into [facility]. Please enter at Entrance (where they do valet parking) and go in the double doors and walk up to registration.  We hope you feel better soon.

## 2018-09-22 NOTE — Progress Notes (Signed)
Orthopedic Tech Progress Note Patient Details:  Kathleen [name] [birth year] [id number]  Ortho Devices Type of Ortho Device: Haematologist Ortho Device/Splint Location: bilateral Ortho Device/Splint Interventions: Adjustment, Application, Ordered   Post Interventions Patient Tolerated: Well Instructions Provided: Care of device, Adjustment of device   Janit Pagan 09/22/2018, 4:33 PM

## 2018-09-22 NOTE — Progress Notes (Signed)
  Echocardiogram 2D Echocardiogram has been performed.  Datron Brakebill L Androw 09/22/2018, 3:36 PM

## 2018-09-22 NOTE — Progress Notes (Signed)
Patient arrived from office. Patient placed on monitor and vital signs obtained. Patient CHG done and patient oriented to room. Will monitor patient. Idalis Hoelting, Bettina Gavia rN

## 2018-09-22 NOTE — Progress Notes (Signed)
    Patient admitted from the office for acute CHF with plans for diuresis. Cr on admission noted at 2.8, up from 2.71 back on 5/19. Has been attempting to diuresis with oral diuretic at home prior to admission. IV lasix ordered to attempt diuresis. Will continue with current plan this evening, follow up lasix response in the am along with Cr and consider nephrology consult based on findings.   SignedReino Bellis, NP-C 09/22/2018, 3:56 PM Pager: [phone]

## 2018-09-22 NOTE — H&P (Addendum)
Cardiology Office Note    Date:  09/22/2018  ID:  Kathleen [name], DOB 04-Oct-1950, MRN [id number] PCP:  Aldine Contes, MD  Cardiologist:  Fransico Him, MD   Chief Complaint: f/u weight gain/CHF  History of Present Illness:  Kathleen [name] is a 68 y.o. female with history of permanent atrial flutter with baseline bradycardia, hypertension, super morbid obesity, IIDDM, dyslipidemia, obesity hypoventilation syndrome (on home O2 with no OSA by prior PSG but showed nocturnal hypoxemia), CKD stage IV, chronic diastolic CHF, chronic edema likely due in part to obesity who is seen for in-person follow-up.   Last echo 04/2016 showed EF 55-60%, mild MR, mild LAE/RAE. She previously was trialed on higher dose of Bumex but creatinine bumped so this was decreased. In 05/2018 she was switched to torsemide (due to still having 3+ edema on exam). By Lacey Jensen 5/13 the patient reported increased weight gain.. She reports her usual weight is in the 260's but she has not been that low in several years. She has had an upward trend gradually over the last 4 years. She was 282 in the office in 05/2018, but more recently in the low 300s. When [facility] went out she had increased to 308. Due to weight gain, metolazone was added x 2 days without significant improvement so torsemide was increased to 30m BID. She saw primary care 5/19 who noted persistent lower extremity edema and wheezing. CXR showed mild CHF. Her labs 5/19 showed a rise in BUN/Cr to 75/2.71 (K 4.6) so she was asked to hold both diuretics and get an in-person cardiology OV. Unfortunately the patient did not return our calls and did not begin holding med until yesterday (5/27).   She is seen in person for follow-up today and appears volume overloaded. Her weight is at 301 today. She reports orthopnea, increased DOE, and increased LEE which is now uncomfortable physically. Last week her legs were weeping and had split open in some places. She did not  bring her O2 today but has been trying to use it more at home - it used to be just with exertion and overnight but now she is requiring it at rest. Nurse noted some confusion although she currently appears clear. Sister brought her. Transportation has been a problem in general. She denies any CP.  She does report good UOP. She also reports a sharp pain in her left flank yesterday. No fevers, chills, nausea, vomiting.        Past Medical History:  Diagnosis Date  . Atrial flutter ([place]    a. permanent.  . Bradycardia   . Chronic diastolic [place]congestive) heart failure ([place] 01/10/2015  . CKD (chronic kidney disease), stage IV ([place]   . Degenerative joint disease of hand   . Diabetes mellitus   . Dyslipidemia   . Fecal occult blood test positive   . GERD (gastroesophageal reflux disease)   . Headache   . Hypertension   . Inadequate material resources   . Irritable bowel syndrome   . Morbid obesity ([place]   . Obesity hypoventilation syndrome ([place]   . Post-menopausal bleeding   . Shortness of breath dyspnea    multifactorial from obesity, deconditioning, obesity hypoventilation syndrome         Past Surgical History:  Procedure Laterality Date  . CHOLECYSTECTOMY    . EYE SURGERY     left lens implant s/p cataracts  . OTHER SURGICAL HISTORY     right shoulder tendon repair 05/2011  Current Medications: ActiveMedications      Current Meds  Medication Sig  . ACCU-CHEK AVIVA PLUS test strip TEST BLOOD SUGAR THREE TIMES DAILY  . Accu-Chek FastClix Lancets MISC TEST BLOOD SUGAR THREE TIMES DAILY  . Accu-Chek Softclix Lancets lancets TEST BLOOD SUGAR THREE TIMES DAILY  . apixaban (ELIQUIS) 5 MG TABS tablet Take 1 tablet (5 mg total) by mouth 2 (two) times daily.  Marland Kitchen atorvastatin (LIPITOR) 40 MG tablet Take 1 tablet (40 mg total) by mouth daily.  . Blood Glucose Monitoring Suppl (ACCU-CHEK AVIVA PLUS) w/Device KIT TEST BLOOD SUGAR THREE TIMES DAILY  .  gabapentin (NEURONTIN) 300 MG capsule TAKE 1 CAPSULE EVERY MORNING, TAKE 1 CAPSULE IN THE AFTERNOON, AND TAKE 2 CAPSULES EVERY NIGHT  . insulin detemir (LEVEMIR) 100 UNIT/ML injection Inject 65 units total into the skin once daily.  . Insulin Pen Needle (B-D UF III MINI PEN NEEDLES) 31G X 5 MM MISC USE ONE TIME DAILY TO INJECT VICTOZA. DX:E11.9  . Insulin Syringe-Needle U-100 (BD VEO INSULIN SYRINGE U/F) 31G X 15/64" 1 ML MISC USE TO INJECT INSULIN ONE TIME A DAY  . liraglutide (VICTOZA) 18 MG/3ML SOPN Inject 0.3 mLs (1.8 mg total) into the skin daily.  . metFORMIN (GLUCOPHAGE) 500 MG tablet Take 1 tablet (500 mg total) by mouth 2 (two) times daily with a meal.  . Multiple Vitamins-Minerals (HEALTHY EYES PO) Take 1 tablet by mouth daily. Reported on 05/17/2015  . NON FORMULARY Place 2 L into the nose daily. Wears with exertion and qhs       Allergies:   Doxycycline   Social History        Socioeconomic History  . Marital status: Divorced    Spouse name: Not on file  . Number of children: 2  . Years of education: Not on file  . Highest education level: Not on file  Occupational History  . Occupation: unemployed    Fish farm manager: UNEMPLOYED  Social Needs  . Financial resource strain: Not on file  . Food insecurity:    Worry: Not on file    Inability: Not on file  . Transportation needs:    Medical: Not on file    Non-medical: Not on file  Tobacco Use  . Smoking status: Never Smoker  . Smokeless tobacco: Never Used  Substance and Sexual Activity  . Alcohol use: No    Alcohol/week: 0.0 standard drinks  . Drug use: No  . Sexual activity: Not Currently  Lifestyle  . Physical activity:    Days per week: Not on file    Minutes per session: Not on file  . Stress: Not on file  Relationships  . Social connections:    Talks on phone: Not on file    Gets together: Not on file    Attends religious service: Not on file    Active member of club or  organization: Not on file    Attends meetings of clubs or organizations: Not on file    Relationship status: Not on file  Other Topics Concern  . Not on file  Social History Narrative   Single.   Divorced x2.   Has 2 children, by two different fathers.   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 May apply for disability/SSI given her pain in her hands which has made interviewing for jobs difficult.   Never Smoked   Alcohol use-no   Drug use-no  Regular exercise-yes      Financial assistance approved for 100% discount at [facility] and has [facility] card per [employer]   02/18/2010              Family History:  The patient's family history includes Diabetes in her mother and sister; Obesity in her mother; Stroke in her father. There is no history of Colon cancer.  ROS:   Please see the history of present illness.  All other systems are reviewed and otherwise negative.    PHYSICAL EXAM:   VS:  BP 126/62   Pulse (!) 57   Ht 5' (1.524 m)   Wt (!) 301 lb 1.9 oz (136.6 kg)   SpO2 (!) 84%   BMI 58.81 kg/m   BMI: Body mass index is 58.81 kg/m. GEN: Morbidly obese WF in no acute distress HEENT: normocephalic, atraumatic Neck: no JVD, carotid bruits, or masses Cardiac: slightly irregular rhythm HR controlled; no murmurs, rubs, or gallops, no edema  Respiratory:  Diminished BS throughout. No wheezing or rhonchi GI: soft, nontender, obese pannus, + BS MS: no deformity or atrophy Skin: 3+ BLE edema, tight, with mild erythema bilaterally superimposed on large baseline leg habitus Neuro:  Alert and Oriented x 3, Strength and sensation are intact, follows command Psych: euthymic mood, full affect    Wt Readings from Last 3 Encounters:  09/22/18 (!) 301 lb 1.9 oz (136.6 kg)  09/13/18 (!) 303 lb 8 oz (137.7 kg)  09/07/18 (!) 301 lb (136.5 kg)      Studies/Labs Reviewed:   EKG:  EKG  was ordered today and personally reviewed by me and demonstrates atrial flutter with slow ventricular response similar to prior, no acute changes. F/u EKG to check lead placement shows ventricular trigeminy.   Recent Labs: 06/17/2018: Hemoglobin 11.5; Platelets 135 09/13/2018: BUN 75; Creatinine, Ser 2.71; Potassium 4.6; Sodium 143   Lipid Panel Labs(Brief)          Component Value Date/Time   CHOL 66 (L) 06/07/2018 1154   TRIG 60 06/07/2018 1154   HDL 30 (L) 06/07/2018 1154   CHOLHDL 2.2 06/07/2018 1154   CHOLHDL 3.5 06/07/2014 1027   VLDL 16 06/07/2014 1027   LDLCALC 24 06/07/2018 1154      Additional studies/ records that were reviewed today include: Summarized above   ASSESSMENT & PLAN:   1. Acute on chronic diastolic CHF - has failed outpatient diuretic adjustment complicated by underlying CKD. This has been made challenging by transportation issues as well. Patient seen with Dr. Irish Lack. Her volume status is rather challenging to assess in general as she likely has some degree of lymphedema related to her morbid obesity. However, she also has worsening SOB, orthopnea, and a worsening O2 requirement. We will plan to admit and start IV Lasix 56m BID along with checking labs. May benefit from nephrology consult in the hospital. Will update 2D echocardiogram. If orthopnea resolves to the point she can lie flat, would benefit from right heart catheterization during admission to more confidently assess volume status. Will order UNNA boots. Would also consider repeat sleep study as outpatient to exclude development of full OSA once Covid pandemic settles down. 2. Acute on chronic respiratory failure - Likely needs home O2 around the clock at this point instead of just with exertion/QHS, but can evaluate for home disposition closer to discharge. CXR last week with CHF.  3. AKI on CKD stage IV - plan [place]. Obtain UA and albumin. Check renal  function. Consider  nephrology consultation. 4. Morbid obesity - likely the crux of her issues and will only perpetuate her chronic issues. She understands the dire need to lose weight. 5. Permanent atrial flutter - EKG appears similar to prior. Continue Eliquis for now. Rate remains similar to prior. Not on any AVN blocking agents. 6. DM - add SSI to home regimen with CBG checks. Addendum: hold metformin. 7. Falls - noted during OP [facility] visit. Will get PT to see while inpatient to assess home health needs.  Disposition: F/u TBD post hospitalization.  Medication Adjustments/Labs and Tests Ordered: Current medicines are reviewed at length with the patient today.  Concerns regarding medicines are outlined above. Medication changes, Labs and Tests ordered today are summarized above and listed in the Patient Instructions accessible in Encounters.   Signed, Charlie Pitter, PA-C  09/22/2018 11:57 AM    [address], [place], [place]  [address] Phone: [phone]; Fax: [phone]     I have examined the patient and reviewed assessment and plan and discussed with patient.  Agree with above as stated.  Patient with volume overload, prior normal LV function.  Increasing diuretics have resulted in worsening renal function.    Plan to admit for IV diuretics, daily labs.  May need [facility] with heart failure service.    Larae Grooms

## 2018-09-22 NOTE — Progress Notes (Signed)
Patient sister updated on patient via phone. No questions at this time. Will monitor patient. Aveon Colquhoun, Bettina Gavia RN

## 2018-09-22 NOTE — Telephone Encounter (Signed)
Addressed with OV.

## 2018-09-23 ENCOUNTER — Inpatient Hospital Stay (HOSPITAL_COMMUNITY): Payer: Medicare HMO

## 2018-09-23 ENCOUNTER — Ambulatory Visit: Payer: Medicare HMO | Admitting: Dietician

## 2018-09-23 ENCOUNTER — Other Ambulatory Visit: Payer: Self-pay

## 2018-09-23 ENCOUNTER — Encounter (HOSPITAL_COMMUNITY): Payer: Self-pay | Admitting: Internal Medicine

## 2018-09-23 ENCOUNTER — Inpatient Hospital Stay: Payer: Self-pay

## 2018-09-23 ENCOUNTER — Encounter (HOSPITAL_COMMUNITY)
Admission: AD | Disposition: A | Discharge status: Nursing Home (Includes ICF) | Payer: Self-pay | Source: Ambulatory Visit | Attending: Interventional Cardiology

## 2018-09-23 DIAGNOSIS — I2721 Secondary pulmonary arterial hypertension: Secondary | ICD-10-CM

## 2018-09-23 HISTORY — PX: RIGHT HEART CATH: CATH118263

## 2018-09-23 LAB — BASIC METABOLIC PANEL
Anion gap: 9 (ref 5–15)
BUN: 75 mg/dL — ABNORMAL HIGH (ref 8–23)
CO2: 30 mmol/L (ref 22–32)
Calcium: 8.7 mg/dL — ABNORMAL LOW (ref 8.9–10.3)
Chloride: 101 mmol/L (ref 98–111)
Creatinine, Ser: 3.26 mg/dL — ABNORMAL HIGH (ref 0.44–1.00)
GFR calc Af Amer: 16 mL/min — ABNORMAL LOW (ref >60)
GFR calc non Af Amer: 14 mL/min — ABNORMAL LOW (ref >60)
Glucose, Bld: 181 mg/dL — ABNORMAL HIGH (ref 70–99)
Potassium: 4.5 mmol/L (ref 3.5–5.1)
Sodium: 140 mmol/L (ref 135–145)

## 2018-09-23 LAB — POCT I-STAT EG7
Acid-Base Excess: 2 mmol/L (ref 0.0–2.0)
Acid-Base Excess: 3 mmol/L — ABNORMAL HIGH (ref 0.0–2.0)
Acid-Base Excess: 3 mmol/L — ABNORMAL HIGH (ref 0.0–2.0)
Bicarbonate: 29.9 mmol/L — ABNORMAL HIGH (ref 20.0–28.0)
Bicarbonate: 31.1 mmol/L — ABNORMAL HIGH (ref 20.0–28.0)
Bicarbonate: 31.3 mmol/L — ABNORMAL HIGH (ref 20.0–28.0)
Calcium, Ion: 1.1 mmol/L — ABNORMAL LOW (ref 1.15–1.40)
Calcium, Ion: 1.22 mmol/L (ref 1.15–1.40)
Calcium, Ion: 1.24 mmol/L (ref 1.15–1.40)
HCT: 32 % — ABNORMAL LOW (ref 36.0–46.0)
HCT: 34 % — ABNORMAL LOW (ref 36.0–46.0)
HCT: 35 % — ABNORMAL LOW (ref 36.0–46.0)
Hemoglobin: 10.9 g/dL — ABNORMAL LOW (ref 12.0–15.0)
Hemoglobin: 11.6 g/dL — ABNORMAL LOW (ref 12.0–15.0)
Hemoglobin: 11.9 g/dL — ABNORMAL LOW (ref 12.0–15.0)
O2 Saturation: 58 %
O2 Saturation: 58 %
O2 Saturation: 60 %
Potassium: 3.9 mmol/L (ref 3.5–5.1)
Potassium: 4.1 mmol/L (ref 3.5–5.1)
Potassium: 4.2 mmol/L (ref 3.5–5.1)
Sodium: 142 mmol/L (ref 135–145)
Sodium: 143 mmol/L (ref 135–145)
Sodium: 144 mmol/L (ref 135–145)
TCO2: 32 mmol/L (ref 22–32)
TCO2: 33 mmol/L — ABNORMAL HIGH (ref 22–32)
TCO2: 33 mmol/L — ABNORMAL HIGH (ref 22–32)
pCO2, Ven: 64.2 mmHg — ABNORMAL HIGH (ref 44.0–60.0)
pCO2, Ven: 65.3 mmHg — ABNORMAL HIGH (ref 44.0–60.0)
pCO2, Ven: 66.8 mmHg — ABNORMAL HIGH (ref 44.0–60.0)
pH, Ven: 7.277 (ref 7.250–7.430)
pH, Ven: 7.278 (ref 7.250–7.430)
pH, Ven: 7.286 (ref 7.250–7.430)
pO2, Ven: 35 mmHg (ref 32.0–45.0)
pO2, Ven: 35 mmHg (ref 32.0–45.0)
pO2, Ven: 36 mmHg (ref 32.0–45.0)

## 2018-09-23 LAB — T4, FREE: Free T4: 1.1 ng/dL (ref 0.82–1.77)

## 2018-09-23 LAB — GLUCOSE, CAPILLARY
Glucose-Capillary: 174 mg/dL — ABNORMAL HIGH (ref 70–99)
Glucose-Capillary: 141 mg/dL — ABNORMAL HIGH (ref 70–99)
Glucose-Capillary: 171 mg/dL — ABNORMAL HIGH (ref 70–99)
Glucose-Capillary: 168 mg/dL — ABNORMAL HIGH (ref 70–99)
Glucose-Capillary: 158 mg/dL — ABNORMAL HIGH (ref 70–99)

## 2018-09-23 LAB — HIV ANTIBODY (ROUTINE TESTING W REFLEX): HIV Screen 4th Generation wRfx: NONREACTIVE

## 2018-09-23 SURGERY — RIGHT HEART CATH
Anesthesia: LOCAL

## 2018-09-23 MED ORDER — MILRINONE LACTATE IN DEXTROSE 20-5 MG/100ML-% IV SOLN
0.2500 ug/kg/min | INTRAVENOUS | Status: DC
  Administered 2018-09-23 – 2018-09-24 (×4): 0.25 ug/kg/min via INTRAVENOUS
  Filled 2018-09-23 (×4): qty 100

## 2018-09-23 MED ORDER — SODIUM CHLORIDE 0.9% FLUSH
10.0000 mL | INTRAVENOUS | Status: DC | PRN
  Administered 2018-09-26: 10 mL
  Administered 2018-10-21: 40 mL
  Filled 2018-09-23 (×2): qty 40

## 2018-09-23 MED ORDER — FUROSEMIDE 10 MG/ML IJ SOLN
120.0000 mg | INTRAVENOUS | Status: AC
  Administered 2018-09-23: 14:00:00 120 mg via INTRAVENOUS
  Filled 2018-09-23: qty 12

## 2018-09-23 MED ORDER — SODIUM CHLORIDE 0.9 % IV SOLN
250.0000 mL | INTRAVENOUS | Status: DC | PRN
  Administered 2018-09-24 – 2018-09-29 (×5): 250 mL via INTRAVENOUS

## 2018-09-23 MED ORDER — HEPARIN (PORCINE) IN NACL 1000-0.9 UT/500ML-% IV SOLN
INTRAVENOUS | Status: AC
  Filled 2018-09-23: qty 500

## 2018-09-23 MED ORDER — SODIUM CHLORIDE 0.9 % IV SOLN: INTRAVENOUS | Status: DC

## 2018-09-23 MED ORDER — SODIUM CHLORIDE 0.9% FLUSH: 3.0000 mL | INTRAVENOUS | Status: DC | PRN

## 2018-09-23 MED ORDER — FUROSEMIDE 10 MG/ML IJ SOLN
120.0000 mg | Freq: Two times a day (BID) | INTRAVENOUS | Status: DC
  Administered 2018-09-23: 19:00:00 120 mg via INTRAVENOUS
  Filled 2018-09-23 (×3): qty 12

## 2018-09-23 MED ORDER — HYDRALAZINE HCL 20 MG/ML IJ SOLN: 10.0000 mg | INTRAMUSCULAR | Status: AC | PRN

## 2018-09-23 MED ORDER — LABETALOL HCL 5 MG/ML IV SOLN: 10.0000 mg | INTRAVENOUS | Status: AC | PRN

## 2018-09-23 MED ORDER — HEPARIN (PORCINE) IN NACL 1000-0.9 UT/500ML-% IV SOLN
INTRAVENOUS | Status: DC | PRN
  Administered 2018-09-23: 500 mL

## 2018-09-23 MED ORDER — LIDOCAINE HCL (PF) 1 % IJ SOLN
INTRAMUSCULAR | Status: AC
  Filled 2018-09-23: qty 30

## 2018-09-23 MED ORDER — SODIUM CHLORIDE 0.9% FLUSH: 3.0000 mL | Freq: Two times a day (BID) | INTRAVENOUS | Status: DC

## 2018-09-23 MED ORDER — ASPIRIN 81 MG PO CHEW
CHEWABLE_TABLET | ORAL | Status: AC
  Filled 2018-09-23: qty 1

## 2018-09-23 MED ORDER — ACETAMINOPHEN 325 MG PO TABS
650.0000 mg | ORAL_TABLET | ORAL | Status: DC | PRN
  Filled 2018-09-23: qty 2

## 2018-09-23 MED ORDER — LIDOCAINE HCL (PF) 1 % IJ SOLN
INTRAMUSCULAR | Status: DC | PRN
  Administered 2018-09-23: 2 mL

## 2018-09-23 MED ORDER — ASPIRIN 81 MG PO CHEW
81.0000 mg | CHEWABLE_TABLET | ORAL | Status: AC
  Administered 2018-09-23: 81 mg via ORAL

## 2018-09-23 MED ORDER — ONDANSETRON HCL 4 MG/2ML IJ SOLN
4.0000 mg | Freq: Four times a day (QID) | INTRAMUSCULAR | Status: DC | PRN
  Administered 2018-09-28: 4 mg via INTRAVENOUS
  Filled 2018-09-23: qty 2

## 2018-09-23 MED ORDER — SODIUM CHLORIDE 0.9 % IV SOLN: 250.0000 mL | INTRAVENOUS | Status: DC | PRN

## 2018-09-23 MED ORDER — SODIUM CHLORIDE 0.9% FLUSH
3.0000 mL | Freq: Two times a day (BID) | INTRAVENOUS | Status: DC
  Administered 2018-09-23 – 2018-09-29 (×6): 3 mL via INTRAVENOUS

## 2018-09-23 SURGICAL SUPPLY — 9 items
CATH BALLN WEDGE 5F 110CM (CATHETERS) ×2 IMPLANT
COVER DOME SNAP 22 D (MISCELLANEOUS) ×2 IMPLANT
GUIDEWIRE .025 260CM (WIRE) ×2 IMPLANT
HOVERMATT SINGLE USE (MISCELLANEOUS) ×2 IMPLANT
PACK CARDIAC CATHETERIZATION (CUSTOM PROCEDURE TRAY) ×2 IMPLANT
SHEATH GLIDE SLENDER 4/5FR (SHEATH) ×2 IMPLANT
TRANSDUCER W/STOPCOCK (MISCELLANEOUS) ×2 IMPLANT
TUBING ART PRESS 72  MALE/FEM (TUBING) ×1
TUBING ART PRESS 72 MALE/FEM (TUBING) ×1 IMPLANT

## 2018-09-23 NOTE — Progress Notes (Signed)
PT Cancellation Note  Patient Details Name: Kathleen [name] MRN: [id number] DOB: Feb 15, 1951   Cancelled Treatment:    Reason Eval/Treat Not Completed: Patient at procedure or test/unavailable(Currently in CATH lab.  Will follow up as able. )   Godfrey Pick [name] 09/23/2018, 9:00 AM  Allure Greaser,PT Acute Rehabilitation Services Pager:  [phone]  Office:  [phone]

## 2018-09-23 NOTE — Progress Notes (Signed)
Peripherally Inserted Central Catheter/Midline Placement  The IV Nurse has discussed with the patient and/or persons authorized to consent for the patient, the purpose of this procedure and the potential benefits and risks involved with this procedure.  The benefits include less needle sticks, lab draws from the catheter, and the patient may be discharged home with the catheter. Risks include, but not limited to, infection, bleeding, blood clot (thrombus formation), and puncture of an artery; nerve damage and irregular heartbeat and possibility to perform a PICC exchange if needed/ordered by physician.  Alternatives to this procedure were also discussed.  Bard Power PICC patient education guide, fact sheet on infection prevention and patient information card has been provided to patient /or left at bedside.    PICC/Midline Placement Documentation        Kathleen [name] 09/23/2018, 11:17 AM

## 2018-09-23 NOTE — Consult Note (Signed)
Advanced Heart Failure Team Consult Note   Primary Physician: Aldine Contes, MD PCP-Cardiologist:  Fransico Him, MD  Consulting: Dr. Irish Lack  Reason for Consultation: Heart failure  HPI:    Sosha Shepherd Floresis a 68 y.o.femalewith history ofpermanent AF, hypertension, super morbid obesity, DM2, dyslipidemia, obesity hypoventilation syndrome (on home O2 with no OSA bypriorPSG but showed nocturnal hypoxemia), CKD stage IV (baseline creatinine about 1.9), chronic diastolic CHF we are asked to see in consult for further management of HF by Dr. Irish Lack.   She has been followed by Dr. Radford Pax. She has struggled for a long time with severe edema.   Last echo 04/2016 showed EF 55-60%, mild MR, mild LAE/RAE. RV normal She previously was trialed on higher dose of Bumex but creatinine bumped so this was decreased. In 05/2018 she was switched to torsemide (due to still having3+ edema on exam). By Lacey Jensen 5/13 the patient reported increased weight gain.. She reports her usual weight is in the 260's but she has not been that low in several years. She has had an upward trend gradually over the last 4 years.She was 282 in the office in 05/2018, but more recently in the low 300s. When [facility] went out she had increased to 308.Due to weight gain, metolazone was added x 2 days without significant improvement so torsemide was increased to 39m BID.She saw primary care 5/19 who noted persistent lower extremity edema and wheezing. CXR showed mild CHF.Her labs 5/19 showed a rise in BUN/Cr to 75/2.71 (K 4.6) so she was asked to hold both diuretics and get an in-person cardiology OV. Unfortunately the patient did not return our calls and did not begin holding diuretics yesterday (5/27).   Admitted yesterday with progressive edema and HF symptoms. Weight up to 301 pounds.   Received IV lasix overnight. Says she diuresed well. Weight down 2 pounds but creatinine up to 3.2   I took her for RHC this  morning which showed markedly elevated biventricular filling pressures with normal cardiac output (likely overestimated by Fick due to weight)   RA = 24 RV = 94/26 PA = 95/36 (53) PCW = 30 (v = 50) Fick cardiac output/index = 6.0/2.7 PVR =3.9 WU FA sat = 91% PA sat = 58%, 58% SVC 60%    Review of Systems: [y] = yes, _0  = no   . General: Weight gain [ y]; Weight loss _1 ; Anorexia _2 ; Fatigue [Blue.Reese]; Fever _3 ; Chills _4 ; Weakness [ y]  . Cardiac: Chest pain/pressure _5 ; Resting SOB [y]; Exertional SOB [Blue.Reese]; Orthopnea [Blue.Reese]; Pedal Edema [Blue.Reese]; Palpitations _6 ; Syncope _7 ; Presyncope _8 ; Paroxysmal nocturnal dyspnea[y ]  . Pulmonary: Cough [Blue.Reese]; Wheezing_9 ; Hemoptysis_10 ; Sputum _11 ; Snoring [Blue.Reese]  . GI: Vomiting_12 ; Dysphagia_13 ; Melena_14 ; Hematochezia _15 ; Heartburn_16 ; Abdominal pain _17 ; Constipation _18 ; Diarrhea _19 ; BRBPR _20   . GU: Hematuria_21 ; Dysuria _22 ; Nocturia_23   . Vascular: Pain in legs with walking _24 ; Pain in feet with lying flat _25 ; Non-healing sores _26 ; Stroke _27 ; TIA _28 ; Slurred speech _29 ;  . Neuro: Headaches_30 ; Vertigo_31 ; Seizures_32 ; Paresthesias_33 ;Blurred vision _34 ; Diplopia _35 ; Vision changes _36   . Ortho/Skin: Arthritis [ y]; Joint pain [ y]; Muscle pain _37 ; Joint swelling _38 ; Back Pain _39 ; Rash _40   . Psych: Depression_41 ;  Anxiety_0   . Heme: Bleeding problems _1 ; Clotting disorders _2 ; Anemia Blue.Reese ]  . Endocrine: Diabetes [ y]; Thyroid dysfunction_3   Home Medications Prior to Admission medications   Medication Sig Start Date End Date Taking? Authorizing Provider  ACCU-CHEK AVIVA PLUS test strip TEST BLOOD SUGAR THREE TIMES DAILY 09/20/18   Aldine Contes, MD  Accu-Chek FastClix Lancets MISC TEST BLOOD SUGAR THREE TIMES DAILY 09/06/18   Aldine Contes, MD  Accu-Chek Softclix Lancets lancets TEST BLOOD SUGAR THREE TIMES DAILY 09/20/18   Aldine Contes, MD  apixaban (ELIQUIS) 5 MG TABS tablet Take 1 tablet (5 mg total) by mouth 2  (two) times daily. 06/07/18   Aldine Contes, MD  atorvastatin (LIPITOR) 40 MG tablet Take 1 tablet (40 mg total) by mouth daily. 02/14/18   Aldine Contes, MD  Blood Glucose Monitoring Suppl (ACCU-CHEK AVIVA PLUS) w/Device KIT TEST BLOOD SUGAR THREE TIMES DAILY 09/05/18   Aldine Contes, MD  gabapentin (NEURONTIN) 300 MG capsule TAKE 1 CAPSULE EVERY MORNING, TAKE 1 CAPSULE IN THE AFTERNOON, AND TAKE 2 CAPSULES EVERY NIGHT 09/06/18   Aldine Contes, MD  insulin detemir (LEVEMIR) 100 UNIT/ML injection Inject 65 units total into the skin once daily. 02/23/18   Bloomfield, Carley D, DO  Insulin Pen Needle (B-D UF III MINI PEN NEEDLES) 31G X 5 MM MISC USE ONE TIME DAILY TO INJECT VICTOZA. DX:E11.9 09/06/18   Aldine Contes, MD  Insulin Syringe-Needle U-100 (BD VEO INSULIN SYRINGE U/F) 31G X 15/64" 1 ML MISC USE TO INJECT INSULIN ONE TIME A DAY 06/03/18   Aldine Contes, MD  liraglutide (VICTOZA) 18 MG/3ML SOPN Inject 0.3 mLs (1.8 mg total) into the skin daily. 02/09/18   Seawell, Jaimie A, DO  metFORMIN (GLUCOPHAGE) 500 MG tablet Take 1 tablet (500 mg total) by mouth 2 (two) times daily with a meal. 09/13/18   Aldine Contes, MD  Multiple Vitamins-Minerals (HEALTHY EYES PO) Take 1 tablet by mouth daily. Reported on 05/17/2015    [provider]  NON FORMULARY Place 2 L into the nose daily. Wears with exertion and qhs    [provider]    Past Medical History: Past Medical History:  Diagnosis Date  . Atrial flutter ([place])    a. permanent.  . Bradycardia   . Chronic diastolic (congestive) heart failure ([place]) 01/10/2015  . CKD (chronic kidney disease), stage IV ([place])   . Degenerative joint disease of hand   . Diabetes mellitus   . Dyslipidemia   . Fecal occult blood test positive   . GERD (gastroesophageal reflux disease)   . Headache   . Hypertension   . Inadequate material resources   . Irritable bowel syndrome   . Morbid obesity ([place])   . Obesity  hypoventilation syndrome ([place])   . Post-menopausal bleeding   . Shortness of breath dyspnea    multifactorial from obesity, deconditioning, obesity hypoventilation syndrome    Past Surgical History: Past Surgical History:  Procedure Laterality Date  . CHOLECYSTECTOMY    . EYE SURGERY     left lens implant s/p cataracts  . OTHER SURGICAL HISTORY     right shoulder tendon repair 05/2011  . RIGHT HEART CATH N/A 09/23/2018   Procedure: RIGHT HEART CATH;  Surgeon: Jolaine Artist, MD;  Location: [facility];  Service: Cardiovascular;  Laterality: N/A;    Family History: Family History  Problem Relation Age of Onset  . Diabetes Mother   . Obesity Mother   . Stroke  Father   . Diabetes Sister   . Colon cancer Neg Hx     Social History: Social History   Socioeconomic History  . Marital status: Divorced    Spouse name: Not on file  . Number of children: 2  . Years of education: Not on file  . Highest education level: Not on file  Occupational History  . Occupation: unemployed    Fish farm manager: UNEMPLOYED  Social Needs  . Financial resource strain: Not on file  . Food insecurity:    Worry: Not on file    Inability: Not on file  . Transportation needs:    Medical: Not on file    Non-medical: Not on file  Tobacco Use  . Smoking status: Never Smoker  . Smokeless tobacco: Never Used  Substance and Sexual Activity  . Alcohol use: No    Alcohol/week: 0.0 standard drinks  . Drug use: No  . Sexual activity: Not Currently  Lifestyle  . Physical activity:    Days per week: Not on file    Minutes per session: Not on file  . Stress: Not on file  Relationships  . Social connections:    Talks on phone: Not on file    Gets together: Not on file    Attends religious service: Not on file    Active member of club or organization: Not on file    Attends meetings of clubs or organizations: Not on file    Relationship status: Not on file  Other Topics Concern  . Not on file   Social History Narrative   Single.   Divorced x2.   Has 2 children, by two different fathers.   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 May apply for disability/SSI given her pain in her hands which has made interviewing for jobs difficult.   Never Smoked   Alcohol use-no   Drug use-no   Regular exercise-yes      Financial assistance approved for 100% discount at [facility] and has [facility] card per [employer]   02/18/2010             Allergies:  Allergies  Allergen Reactions  . Doxycycline     REACTION: Wheals and pruritus    Objective:    Vital Signs:   Temp:  [97.9 F (36.6 C)-98 F (36.7 C)] 98 F (36.7 C) (05/29 0343) Pulse Rate:  [46-60] 50 (05/29 0419) Resp:  [17-18] 18 (05/29 0419) BP: (100-125)/(56-69) 100/56 (05/29 0343) SpO2:  [91 %-92 %] 91 % (05/29 0814) Weight:  [134.8 kg-136 kg] 134.8 kg (05/29 0419) Last BM Date: 09/22/18  Weight change: Filed Weights   09/22/18 1329 09/23/18 0419  Weight: 136 kg 134.8 kg    Intake/Output:   Intake/Output Summary (Last 24 hours) at 09/23/2018 1302 Last data filed at 09/23/2018 0419 Gross per 24 hour  Intake 240 ml  Output 900 ml  Net -660 ml      Physical Exam    General:  Morbidly obese woman lying flat in bed. No respiratory distress HEENT: normal Neck: supple. JVP unable to see . Carotids 2+ bilat; no bruits. No lymphadenopathy or thyromegaly appreciated. Cor: PMI nondisplaced. Irregular rate & rhythm. Distant HS 2/6 TR Lungs: clear Abdomen: obese soft, nontender, nondistended. No hepatosplenomegaly. No bruits or masses. Good bowel sounds. Extremities: no cyanosis, clubbing, rash, 2+ edema Neuro: alert & orientedx3, cranial nerves grossly intact. moves all 4 extremities w/o  difficulty. Affect pleasant   Telemetry   AF 50s Personally reviewed   Labs   Basic Metabolic Panel: Recent Labs  Lab 09/22/18 1420 09/23/18 0403  NA  140 140  K 3.9 4.5  CL 101 101  CO2 29 30  GLUCOSE 153* 181*  BUN 76* 75*  CREATININE 2.84* 3.26*  CALCIUM 9.0 8.7*  MG 2.4  --     Liver Function Tests: Recent Labs  Lab 09/22/18 1420  AST 18  ALT 13  ALKPHOS 127*  BILITOT 1.2  PROT 7.2  ALBUMIN 3.2*   No results for input(s): LIPASE, AMYLASE in the last 168 hours. No results for input(s): AMMONIA in the last 168 hours.  CBC: Recent Labs  Lab 09/22/18 1420  WBC 5.7  NEUTROABS 3.9  HGB 10.9*  HCT 38.0  MCV 85.2  PLT 133*    Cardiac Enzymes: No results for input(s): CKTOTAL, CKMB, CKMBINDEX, TROPONINI in the last 168 hours.  BNP: BNP (last 3 results) Recent Labs    09/22/18 1420  BNP 451.2*    ProBNP (last 3 results) No results for input(s): PROBNP in the last 8760 hours.   CBG: Recent Labs  Lab 09/22/18 1625 09/22/18 2054 09/23/18 0553 09/23/18 1237  GLUCAP 178* 202* 174* 141*    Coagulation Studies: No results for input(s): LABPROT, INR in the last 72 hours.   Imaging   US Renal  Result Date: 09/22/2018 CLINICAL DATA:  Renal insufficiency. EXAM: RENAL / URINARY TRACT ULTRASOUND COMPLETE COMPARISON:  None. FINDINGS: Right Kidney: Renal measurements: 11.6 x 4.7 x 4.8 cm = volume: 136.1 mL. Increased renal echogenicity but relatively normal renal cortical thickness and no focal lesions or hydronephrosis. Left Kidney: Renal measurements: 11.2 x 5.6 x 5.1 cm = volume: 166.8 mL. Increased renal echogenicity but relatively normal renal cortical thickness and no focal lesions or hydronephrosis. Bladder: Nondistended bladder.  Patient voided before the exam. IMPRESSION: Increased renal echogenicity suggesting medical renal disease but no focal renal lesions, hydronephrosis or renal calculi. Nondistended bladder. Electronically Signed   By: Marijo Sanes M.D.   On: 09/22/2018 19:11   Dg Chest Port 1 View  Result Date: 09/23/2018 CLINICAL DATA:  Central line placement. EXAM: PORTABLE CHEST 1 VIEW  COMPARISON:  09/13/2018. FINDINGS: Apical lordotic view obtained. Prominence of superior mediastinum most likely related to prominent great vessels and apical lordotic projection. Left sided PICC line noted with tip over superior vena cava. Cardiomegaly with diffuse bilateral pulmonary interstitial prominence. Findings suggest mild CHF. Pneumonitis cannot be excluded. Tiny bilateral pleural effusions cannot be excluded. No pneumothorax. IMPRESSION: 1.  Left PICC line noted over superior vena cava. 2. Cardiomegaly with mild bilateral interstitial prominence suggesting mild CHF. Pneumonitis cannot be excluded. Tiny bilateral pleural effusions cannot be excluded. Electronically Signed   By: Marcello Moores  Register   On: 09/23/2018 12:25   [place] Ekg Site Rite  Result Date: 09/23/2018 If Site Rite image not attached, placement could not be confirmed due to current cardiac rhythm.     Medications:     Current Medications: . apixaban  5 mg Oral BID  . atorvastatin  40 mg Oral q1800  . gabapentin  300 mg Oral BID WC  . gabapentin  600 mg Oral QHS  . insulin aspart  0-9 Units Subcutaneous TID WC  . insulin detemir  65 Units Subcutaneous Daily  . sodium chloride flush  3 mL Intravenous Q12H  . sodium chloride flush  3 mL Intravenous Q12H     Infusions: .  sodium chloride    . sodium chloride    . furosemide    . milrinone 0.25 mcg/kg/min (09/23/18 1243)       Assessment/Plan   1.  Acute on chronic diastolic HF - She has markedly elevated filling pressures complicated by severe cardiorenal syndrome - Will start milrinone and increase lasix to 120 IV bid and see how she responds - May need HD to manage fluid status but may not be HD candidate  2. Pulmonary HTN, severe  - this is mostly pulmonary venous HTN by cath [facility] 2) but also has a component of OHS/OSA (WHO group 3) - no role for selective pulmonary vasodilators - needs weight loss and possibly BIPAP  3. AKI on CKD 3 - baseline  creatinine 1.9. Now up to 3.2 - likely cardiorenal - hopefully will improve with milrinone but may need HD to control volume stauts  4. Morbid obesity - desperately needs weight loss  5. Chronic hypoxic respiratory failure - due to OHS/OSA  - will repeat ABG. Last ABG 1/15  7.35/58/98/97% - sleep study 4/15 without OSA (AHI 1.4) but had nocturnal desat - likely need to repeat. Will d/w Dr. Radford Pax   6. Chronic AF - rate is slow. Continue apixaban  Length of Stay: 1  Glori Bickers, MD  09/23/2018, 1:02 PM  Advanced Heart Failure Team Pager [phone] (M-F; 7a - 4p)  Please contact [facility] for night-coverage after hours (4p -7a ) and weekends on [email or web address]

## 2018-09-23 NOTE — Progress Notes (Signed)
Pt is sleepy and not able to maintain oxygen saturations while sleeping. I have increased on oxygen increasing to 4.5 liters over time. Pt is drinking. Only 170 ml out with purewick after IV PB lasix drip. RN will I/O cath and have RT see patient as pt is mouth breather and oxygen saturations are continuing to drop. Pt resting with call bell within reach.  Will continue to monitor.

## 2018-09-23 NOTE — Evaluation (Signed)
Physical Therapy Evaluation Patient Details Name: Kathleen [name] MRN: [id number] DOB: 1951-01-23 Today's Date: 09/23/2018   History of Present Illness  Kathleen [name] is a 68 y.o. female with history of permanent AF, hypertension, super morbid obesity, DM2, dyslipidemia, obesity hypoventilation syndrome (on home O2 with no OSA by prior PSG but showed nocturnal hypoxemia), CKD stage IV (baseline creatinine about 1.9), chronic diastolic CHF.  Admit for fluid overload. Also with pulmonary HTN.    Clinical Impression  Pt admitted with above diagnosis. Pt currently with functional limitations due to the deficits listed below (see PT Problem List). Pt was unable to participate fully as sats at rest were 86-88% on 4LO2.  Did not tolerate coming to EOB fully as desat to 82%.  Pt did exercise in bed and will progress further next treatment.  Nurse is hopeful that pt will do better with more fluid removed.   Pt will benefit from skilled PT to increase their independence and safety with mobility to allow discharge to the venue listed below.      Follow Up Recommendations Other (comment)(TBA )    Equipment Recommendations  Other (comment)(TBA)    Recommendations for Other Services       Precautions / Restrictions Precautions Precautions: Fall Precaution Comments: watch sats 86-88% at rest on 4LO2.  Nurse aware. Restrictions Weight Bearing Restrictions: No      Mobility  Bed Mobility Overal bed mobility: Needs Assistance Bed Mobility: Supine to Sit     Supine to sit: Min assist     General bed mobility comments: Pt can move but gets very DOE to 3/4 and cant continue.    Transfers                 General transfer comment: TBA next visit  Ambulation/Gait                Stairs            Wheelchair Mobility    Modified Rankin (Stroke Patients Only)       Balance Overall balance assessment: Needs assistance Sitting-balance support: No upper extremity  supported;Feet supported Sitting balance-Leahy Scale: Fair                                       Pertinent Vitals/Pain Pain Assessment: No/denies pain    Home Living Family/patient expects to be discharged to:: Private residence Living Arrangements: Alone Available Help at Discharge: Family;Available PRN/intermittently(sister lives close by) Type of Home: Mobile home Home Access: Stairs to enter   [employer] of Steps: long ramp type entry with 15 steps with walking space in between them Home Layout: One level Home Equipment: Cane - single point;Grab bars - tub/shower;Walker - 4 wheels      Prior Function Level of Independence: Independent with assistive device(s);Needs assistance   Gait / Transfers Assistance Needed: used cane PTA.  Niece recently bought pt a rollator  ADL's / Homemaking Assistance Needed: Did own ADLS, sister assisted with grocery shopping        Hand Dominance   Dominant Hand: Right    Extremity/Trunk Assessment   Upper Extremity Assessment Upper Extremity Assessment: Defer to OT evaluation    Lower Extremity Assessment Lower Extremity Assessment: Generalized weakness    Cervical / Trunk Assessment Cervical / Trunk Assessment: Normal  Communication   Communication: No difficulties  Cognition Arousal/Alertness: Awake/alert Behavior During Therapy: [facility] for  tasks assessed/performed Overall Cognitive Status: Within Functional Limits for tasks assessed                                        General Comments      Exercises General Exercises - Upper Extremity Shoulder Flexion: AROM;Both;10 reps;Supine Elbow Flexion: AROM;Both;10 reps;Supine General Exercises - Lower Extremity Ankle Circles/Pumps: AROM;Both;10 reps;Supine Heel Slides: AROM;Both;10 reps;Supine   Assessment/Plan    PT Assessment Patient needs continued PT services  PT Problem List Decreased activity tolerance;Decreased  balance;Decreased knowledge of use of DME;Decreased safety awareness;Decreased knowledge of precautions;Cardiopulmonary status limiting activity;Decreased mobility       PT Treatment Interventions DME instruction;Gait training;Functional mobility training;Therapeutic activities;Therapeutic exercise;Balance training;Patient/family education    PT Goals (Current goals can be found in the Care Plan section)  Acute Rehab PT Goals Patient Stated Goal: to go home PT Goal Formulation: With patient Time For Goal Achievement: 10/07/18 Potential to Achieve Goals: Good    Frequency Min 3X/week   Barriers to discharge        Co-evaluation               AM-PAC PT "6 Clicks" Mobility  Outcome Measure Help needed turning from your back to your side while in a flat bed without using bedrails?: A Little Help needed moving from lying on your back to sitting on the side of a flat bed without using bedrails?: A Little Help needed moving to and from a bed to a chair (including a wheelchair)?: A Lot Help needed standing up from a chair using your arms (e.g., wheelchair or bedside chair)?: A Lot Help needed to walk in hospital room?: A Lot Help needed climbing 3-5 steps with a railing? : A Lot 6 Click Score: 14    End of Session Equipment Utilized During Treatment: Gait belt;Oxygen Activity Tolerance: Patient limited by fatigue Patient left: in bed;with call bell/phone within reach;with bed alarm set Nurse Communication: Mobility status;Need for lift equipment PT Visit Diagnosis: Muscle weakness (generalized) (M62.81)    Time: 1448-1856 PT Time Calculation (min) (ACUTE ONLY): 23 min   Charges:   PT Evaluation $PT Eval Moderate Complexity: 1 Mod PT Treatments $Therapeutic Activity: 8-22 mins        Lamoyne Palencia,PT Acute Rehabilitation Services Pager:  [phone]  Office:  [phone]    Denice Paradise 09/23/2018, 4:54 PM

## 2018-09-23 NOTE — Interval H&P Note (Signed)
History and Physical Interval Note:  09/23/2018 8:16 AM  Kathleen [name]  has presented today for surgery, with the diagnosis of hf.  The various methods of treatment have been discussed with the patient and family. After consideration of risks, benefits and other options for treatment, the patient has consented to  Procedure(s): RIGHT HEART CATH (N/A) as a surgical intervention.  The patient's history has been reviewed, patient examined, no change in status, stable for surgery.  I have reviewed the patient's chart and labs.  Questions were answered to the patient's satisfaction.     [name]

## 2018-09-23 NOTE — Plan of Care (Signed)
Nutrition Education Note  RD consulted for nutrition education regarding new onset CHF.  RD provided "Low Sodium Nutrition Therapy" handout from the Academy of Nutrition and Dietetics. Reviewed patient's dietary recall. Provided examples on ways to decrease sodium intake in diet. Discouraged intake of processed foods and use of salt shaker. Encouraged fresh fruits and vegetables as well as whole grain sources of carbohydrates to maximize fiber intake.   RD discussed why it is important for patient to adhere to diet recommendations, and emphasized the role of fluids, foods to avoid, and importance of weighing self daily. Teach back method used.  Spoke with pt at bedside. Reports she eats 2-3 meals daily. Denies any recent unintentional wt loss or poor appetite. States she had education in the past regarding a low sodium diet. She removed table salt from daily intake and added Nu-salt. Unsure if pt should continue with salt substitutes with poor kidney function. Discussed ways she could season food without salt or substitutes. Provided meal ideas. Briefly reviewed which foods contain high amounts of salt and daily salt limit.   Expect fair compliance.  Body mass index is 58.04 kg/m. Pt meets criteria for morbid obesity based on current BMI.  Current diet order is 2 gram sodium, patient is consuming approximately 100% of meals at this time. Labs and medications reviewed. No further nutrition interventions warranted at this time. RD contact information provided. If additional nutrition issues arise, please re-consult RD.   Mariana Single RD, LDN Clinical Nutrition Pager # [phone]

## 2018-09-23 NOTE — Progress Notes (Signed)
Per Dr. Haroldine Laws okay to use PICC where tip is.

## 2018-09-23 NOTE — Progress Notes (Signed)
In and out cath completed. 175mL emptied from bladder. Pt adjusted in bed and reports breathing feels much better than before. Saturations between 90 and 92 on 4.5 liters. Respiratory rate 18-20. Will continue to monitor and notify MD if pt's breathing worsens.  Ara Kussmaul BSN, RN

## 2018-09-24 ENCOUNTER — Inpatient Hospital Stay (HOSPITAL_COMMUNITY): Payer: Medicare HMO

## 2018-09-24 ENCOUNTER — Inpatient Hospital Stay (HOSPITAL_COMMUNITY): Payer: Medicare HMO | Admitting: Certified Registered Nurse Anesthetist

## 2018-09-24 DIAGNOSIS — I469 Cardiac arrest, cause unspecified: Secondary | ICD-10-CM

## 2018-09-24 DIAGNOSIS — J9620 Acute and chronic respiratory failure, unspecified whether with hypoxia or hypercapnia: Secondary | ICD-10-CM

## 2018-09-24 DIAGNOSIS — J9621 Acute and chronic respiratory failure with hypoxia: Secondary | ICD-10-CM

## 2018-09-24 LAB — BASIC METABOLIC PANEL
Anion gap: 11 (ref 5–15)
BUN: 77 mg/dL — ABNORMAL HIGH (ref 8–23)
CO2: 27 mmol/L (ref 22–32)
Calcium: 8.2 mg/dL — ABNORMAL LOW (ref 8.9–10.3)
Chloride: 100 mmol/L (ref 98–111)
Creatinine, Ser: 3.08 mg/dL — ABNORMAL HIGH (ref 0.44–1.00)
GFR calc Af Amer: 17 mL/min — ABNORMAL LOW (ref >60)
GFR calc non Af Amer: 15 mL/min — ABNORMAL LOW (ref >60)
Glucose, Bld: 155 mg/dL — ABNORMAL HIGH (ref 70–99)
Potassium: 4.1 mmol/L (ref 3.5–5.1)
Sodium: 138 mmol/L (ref 135–145)

## 2018-09-24 LAB — POCT I-STAT 7, (LYTES, BLD GAS, ICA,H+H)
Bicarbonate: 28.4 mmol/L — ABNORMAL HIGH (ref 20.0–28.0)
Bicarbonate: 26.9 mmol/L (ref 20.0–28.0)
Calcium, Ion: 1.17 mmol/L (ref 1.15–1.40)
Calcium, Ion: 1.14 mmol/L — ABNORMAL LOW (ref 1.15–1.40)
HCT: 33 % — ABNORMAL LOW (ref 36.0–46.0)
HCT: 33 % — ABNORMAL LOW (ref 36.0–46.0)
Hemoglobin: 11.2 g/dL — ABNORMAL LOW (ref 12.0–15.0)
Hemoglobin: 11.2 g/dL — ABNORMAL LOW (ref 12.0–15.0)
O2 Saturation: 99 %
O2 Saturation: 97 %
Patient temperature: 97.7
Potassium: 4 mmol/L (ref 3.5–5.1)
Potassium: 4 mmol/L (ref 3.5–5.1)
Sodium: 138 mmol/L (ref 135–145)
Sodium: 139 mmol/L (ref 135–145)
TCO2: 30 mmol/L (ref 22–32)
TCO2: 29 mmol/L (ref 22–32)
pCO2 arterial: 62.1 mmHg — ABNORMAL HIGH (ref 32.0–48.0)
pCO2 arterial: 51.8 mmHg — ABNORMAL HIGH (ref 32.0–48.0)
pH, Arterial: 7.268 — ABNORMAL LOW (ref 7.350–7.450)
pH, Arterial: 7.321 — ABNORMAL LOW (ref 7.350–7.450)
pO2, Arterial: 160 mmHg — ABNORMAL HIGH (ref 83.0–108.0)
pO2, Arterial: 94 mmHg (ref 83.0–108.0)

## 2018-09-24 LAB — GLUCOSE, CAPILLARY
Glucose-Capillary: 119 mg/dL — ABNORMAL HIGH (ref 70–99)
Glucose-Capillary: 153 mg/dL — ABNORMAL HIGH (ref 70–99)
Glucose-Capillary: 174 mg/dL — ABNORMAL HIGH (ref 70–99)
Glucose-Capillary: 180 mg/dL — ABNORMAL HIGH (ref 70–99)
Glucose-Capillary: 211 mg/dL — ABNORMAL HIGH (ref 70–99)
Glucose-Capillary: 216 mg/dL — ABNORMAL HIGH (ref 70–99)

## 2018-09-24 LAB — RENAL FUNCTION PANEL
Albumin: 2.9 g/dL — ABNORMAL LOW (ref 3.5–5.0)
Anion gap: 15 (ref 5–15)
BUN: 75 mg/dL — ABNORMAL HIGH (ref 8–23)
CO2: 24 mmol/L (ref 22–32)
Calcium: 8.4 mg/dL — ABNORMAL LOW (ref 8.9–10.3)
Chloride: 98 mmol/L (ref 98–111)
Creatinine, Ser: 3.05 mg/dL — ABNORMAL HIGH (ref 0.44–1.00)
GFR calc Af Amer: 18 mL/min — ABNORMAL LOW (ref >60)
GFR calc non Af Amer: 15 mL/min — ABNORMAL LOW (ref >60)
Glucose, Bld: 212 mg/dL — ABNORMAL HIGH (ref 70–99)
Phosphorus: 4.1 mg/dL (ref 2.5–4.6)
Potassium: 4 mmol/L (ref 3.5–5.1)
Sodium: 137 mmol/L (ref 135–145)

## 2018-09-24 LAB — POCT ACTIVATED CLOTTING TIME
Activated Clotting Time: 158 seconds
Activated Clotting Time: 153 seconds
Activated Clotting Time: 164 seconds
Activated Clotting Time: 164 seconds
Activated Clotting Time: 147 seconds
Activated Clotting Time: 153 seconds
Activated Clotting Time: 153 seconds
Activated Clotting Time: 169 seconds

## 2018-09-24 MED ORDER — MIDAZOLAM HCL 2 MG/2ML IJ SOLN
INTRAMUSCULAR | Status: AC
  Filled 2018-09-24: qty 2

## 2018-09-24 MED ORDER — FUROSEMIDE 10 MG/ML IJ SOLN
160.0000 mg | Freq: Once | INTRAVENOUS | Status: AC
  Administered 2018-09-24: 10:00:00 160 mg via INTRAVENOUS
  Filled 2018-09-24: qty 10

## 2018-09-24 MED ORDER — MIDAZOLAM HCL 2 MG/2ML IJ SOLN: 1.0000 mg | INTRAMUSCULAR | Status: DC | PRN

## 2018-09-24 MED ORDER — METOLAZONE 5 MG PO TABS
5.0000 mg | ORAL_TABLET | Freq: Two times a day (BID) | ORAL | Status: DC
  Administered 2018-09-24 – 2018-09-25 (×3): 5 mg via ORAL
  Filled 2018-09-24 (×3): qty 1

## 2018-09-24 MED ORDER — FAMOTIDINE IN NACL 20-0.9 MG/50ML-% IV SOLN
20.0000 mg | INTRAVENOUS | Status: DC
  Administered 2018-09-24 – 2018-11-04 (×42): 20 mg via INTRAVENOUS
  Filled 2018-09-24 (×43): qty 50

## 2018-09-24 MED ORDER — NOREPINEPHRINE 16 MG/250ML-% IV SOLN
0.0000 ug/min | INTRAVENOUS | Status: DC
  Administered 2018-09-24: 22 ug/min via INTRAVENOUS
  Administered 2018-09-24: 10:00:00 14 ug/min via INTRAVENOUS
  Administered 2018-09-25: 14:00:00 25 ug/min via INTRAVENOUS
  Administered 2018-09-26: 18:00:00 18 ug/min via INTRAVENOUS
  Administered 2018-09-27: 15 ug/min via INTRAVENOUS
  Administered 2018-09-28: 13 ug/min via INTRAVENOUS
  Administered 2018-09-29: 12:00:00 5 ug/min via INTRAVENOUS
  Administered 2018-10-04: 02:00:00 2 ug/min via INTRAVENOUS
  Administered 2018-10-05: 16:00:00 9 ug/min via INTRAVENOUS
  Administered 2018-10-06: 18 ug/min via INTRAVENOUS
  Administered 2018-10-07: 11:00:00 19 ug/min via INTRAVENOUS
  Administered 2018-10-08: 09:00:00 7 ug/min via INTRAVENOUS
  Administered 2018-10-09: 16:00:00 10 ug/min via INTRAVENOUS
  Administered 2018-10-11: 20 ug/min via INTRAVENOUS
  Administered 2018-10-11: 19:00:00 14 ug/min via INTRAVENOUS
  Administered 2018-10-12: 24 ug/min via INTRAVENOUS
  Administered 2018-10-13: 19:00:00 14 ug/min via INTRAVENOUS
  Administered 2018-10-13: 01:00:00 15 ug/min via INTRAVENOUS
  Administered 2018-10-14: 10 ug/min via INTRAVENOUS
  Administered 2018-10-18: 2 ug/min via INTRAVENOUS
  Filled 2018-09-24 (×25): qty 250

## 2018-09-24 MED ORDER — FUROSEMIDE 10 MG/ML IJ SOLN
120.0000 mg | Freq: Two times a day (BID) | INTRAVENOUS | Status: DC
  Filled 2018-09-24: qty 12

## 2018-09-24 MED ORDER — AMIODARONE HCL IN DEXTROSE 360-4.14 MG/200ML-% IV SOLN
30.0000 mg/h | INTRAVENOUS | Status: DC
  Administered 2018-09-24 – 2018-09-25 (×2): 30 mg/h via INTRAVENOUS
  Filled 2018-09-24 (×2): qty 200

## 2018-09-24 MED ORDER — ORAL CARE MOUTH RINSE
15.0000 mL | OROMUCOSAL | Status: DC
  Administered 2018-09-24 (×4): 15 mL via OROMUCOSAL

## 2018-09-24 MED ORDER — FENTANYL CITRATE (PF) 100 MCG/2ML IJ SOLN
25.0000 ug | Freq: Once | INTRAMUSCULAR | Status: AC
  Administered 2018-09-24: 10:00:00 25 ug via INTRAVENOUS

## 2018-09-24 MED ORDER — SODIUM CHLORIDE 0.9 % IV SOLN
250.0000 [IU]/h | INTRAVENOUS | Status: DC
  Administered 2018-09-24: 250 [IU]/h via INTRAVENOUS_CENTRAL
  Administered 2018-09-25: 1250 [IU]/h via INTRAVENOUS_CENTRAL
  Administered 2018-09-25: 1300 [IU]/h via INTRAVENOUS_CENTRAL
  Filled 2018-09-24 (×4): qty 2

## 2018-09-24 MED ORDER — ORAL CARE MOUTH RINSE
15.0000 mL | OROMUCOSAL | Status: DC
  Administered 2018-09-24 – 2018-09-29 (×42): 15 mL via OROMUCOSAL

## 2018-09-24 MED ORDER — FENTANYL CITRATE (PF) 100 MCG/2ML IJ SOLN: 25.0000 ug | Freq: Once | INTRAMUSCULAR | Status: DC

## 2018-09-24 MED ORDER — INSULIN ASPART 100 UNIT/ML ~~LOC~~ SOLN
0.0000 [IU] | SUBCUTANEOUS | Status: DC
  Administered 2018-09-24: 20:00:00 4 [IU] via SUBCUTANEOUS
  Administered 2018-09-25 – 2018-09-26 (×2): 3 [IU] via SUBCUTANEOUS
  Administered 2018-09-26: 4 [IU] via SUBCUTANEOUS
  Administered 2018-09-26 (×2): 3 [IU] via SUBCUTANEOUS
  Administered 2018-09-27: 16:00:00 4 [IU] via SUBCUTANEOUS
  Administered 2018-09-27: 3 [IU] via SUBCUTANEOUS
  Administered 2018-09-27 (×2): 4 [IU] via SUBCUTANEOUS
  Administered 2018-09-27 – 2018-09-28 (×2): 3 [IU] via SUBCUTANEOUS
  Administered 2018-09-28 (×2): 4 [IU] via SUBCUTANEOUS
  Administered 2018-09-28: 3 [IU] via SUBCUTANEOUS
  Administered 2018-09-28: 4 [IU] via SUBCUTANEOUS
  Administered 2018-09-28 – 2018-09-29 (×3): 3 [IU] via SUBCUTANEOUS
  Administered 2018-09-30: 2 [IU] via SUBCUTANEOUS
  Administered 2018-09-30: 4 [IU] via SUBCUTANEOUS
  Administered 2018-10-01: 7 [IU] via SUBCUTANEOUS
  Administered 2018-10-01 (×3): 4 [IU] via SUBCUTANEOUS
  Administered 2018-10-01: 7 [IU] via SUBCUTANEOUS
  Administered 2018-10-01 – 2018-10-02 (×2): 4 [IU] via SUBCUTANEOUS
  Administered 2018-10-02 (×3): 7 [IU] via SUBCUTANEOUS
  Administered 2018-10-02 (×2): 4 [IU] via SUBCUTANEOUS
  Administered 2018-10-03: 7 [IU] via SUBCUTANEOUS
  Administered 2018-10-03 (×3): 4 [IU] via SUBCUTANEOUS
  Administered 2018-10-03 (×2): 7 [IU] via SUBCUTANEOUS
  Administered 2018-10-04: 11 [IU] via SUBCUTANEOUS
  Administered 2018-10-04 (×2): 7 [IU] via SUBCUTANEOUS
  Administered 2018-10-04: 11 [IU] via SUBCUTANEOUS
  Administered 2018-10-04: 7 [IU] via SUBCUTANEOUS
  Administered 2018-10-04: 4 [IU] via SUBCUTANEOUS
  Administered 2018-10-05: 15 [IU] via SUBCUTANEOUS
  Administered 2018-10-05 (×5): 11 [IU] via SUBCUTANEOUS
  Administered 2018-10-06 (×2): 15 [IU] via SUBCUTANEOUS
  Administered 2018-10-06 – 2018-10-07 (×3): 11 [IU] via SUBCUTANEOUS
  Administered 2018-10-07: 4 [IU] via SUBCUTANEOUS
  Administered 2018-10-07 (×3): 7 [IU] via SUBCUTANEOUS
  Administered 2018-10-07: 4 [IU] via SUBCUTANEOUS
  Administered 2018-10-08 (×2): 11 [IU] via SUBCUTANEOUS
  Administered 2018-10-08 (×3): 15 [IU] via SUBCUTANEOUS
  Administered 2018-10-08: 11 [IU] via SUBCUTANEOUS
  Administered 2018-10-08 – 2018-10-09 (×3): 15 [IU] via SUBCUTANEOUS
  Administered 2018-10-09: 11:00:00 11 [IU] via SUBCUTANEOUS
  Administered 2018-10-09 – 2018-10-10 (×3): 15 [IU] via SUBCUTANEOUS
  Administered 2018-10-10: 19 [IU] via SUBCUTANEOUS
  Administered 2018-10-10: 15 [IU] via SUBCUTANEOUS
  Administered 2018-10-10 – 2018-10-11 (×5): 11 [IU] via SUBCUTANEOUS
  Administered 2018-10-11 (×2): 15 [IU] via SUBCUTANEOUS
  Administered 2018-10-11 (×2): 11 [IU] via SUBCUTANEOUS
  Administered 2018-10-11 – 2018-10-12 (×2): 7 [IU] via SUBCUTANEOUS
  Administered 2018-10-12 (×2): 11 [IU] via SUBCUTANEOUS
  Administered 2018-10-12: 7 [IU] via SUBCUTANEOUS
  Administered 2018-10-12: 4 [IU] via SUBCUTANEOUS
  Administered 2018-10-13 (×4): 7 [IU] via SUBCUTANEOUS
  Administered 2018-10-13: 16:00:00 4 [IU] via SUBCUTANEOUS
  Administered 2018-10-13: 04:00:00 11 [IU] via SUBCUTANEOUS
  Administered 2018-10-14: 3 [IU] via SUBCUTANEOUS
  Administered 2018-10-14 – 2018-10-15 (×6): 4 [IU] via SUBCUTANEOUS
  Administered 2018-10-15: 04:00:00 7 [IU] via SUBCUTANEOUS
  Administered 2018-10-15: 3 [IU] via SUBCUTANEOUS
  Administered 2018-10-15: 15 [IU] via SUBCUTANEOUS
  Administered 2018-10-15 (×2): 3 [IU] via SUBCUTANEOUS
  Administered 2018-10-16: 7 [IU] via SUBCUTANEOUS
  Administered 2018-10-16 (×3): 4 [IU] via SUBCUTANEOUS
  Administered 2018-10-16: 7 [IU] via SUBCUTANEOUS
  Administered 2018-10-17: 4 [IU] via SUBCUTANEOUS
  Administered 2018-10-17 (×2): 3 [IU] via SUBCUTANEOUS
  Administered 2018-10-17 – 2018-10-18 (×3): 4 [IU] via SUBCUTANEOUS
  Administered 2018-10-18: 3 [IU] via SUBCUTANEOUS
  Administered 2018-10-18 – 2018-10-19 (×3): 4 [IU] via SUBCUTANEOUS
  Administered 2018-10-19 (×2): 7 [IU] via SUBCUTANEOUS
  Administered 2018-10-19: 4 [IU] via SUBCUTANEOUS
  Administered 2018-10-20 (×2): 3 [IU] via SUBCUTANEOUS
  Administered 2018-10-20 (×3): 4 [IU] via SUBCUTANEOUS
  Administered 2018-10-21: 3 [IU] via SUBCUTANEOUS
  Administered 2018-10-21: 12:00:00 4 [IU] via SUBCUTANEOUS
  Administered 2018-10-21: 3 [IU] via SUBCUTANEOUS
  Administered 2018-10-21 (×3): 4 [IU] via SUBCUTANEOUS
  Administered 2018-10-22: 3 [IU] via SUBCUTANEOUS
  Administered 2018-10-22 (×2): 7 [IU] via SUBCUTANEOUS
  Administered 2018-10-22 – 2018-10-23 (×4): 4 [IU] via SUBCUTANEOUS
  Administered 2018-10-23: 7 [IU] via SUBCUTANEOUS
  Administered 2018-10-23: 3 [IU] via SUBCUTANEOUS
  Administered 2018-10-23 – 2018-10-24 (×2): 4 [IU] via SUBCUTANEOUS
  Administered 2018-10-24 (×2): 7 [IU] via SUBCUTANEOUS
  Administered 2018-10-24: 3 [IU] via SUBCUTANEOUS
  Administered 2018-10-24 – 2018-10-25 (×4): 4 [IU] via SUBCUTANEOUS
  Administered 2018-10-25 (×2): 7 [IU] via SUBCUTANEOUS
  Administered 2018-10-25: 4 [IU] via SUBCUTANEOUS
  Administered 2018-10-25: 7 [IU] via SUBCUTANEOUS
  Administered 2018-10-25: 05:00:00 4 [IU] via SUBCUTANEOUS
  Administered 2018-10-26 (×3): 7 [IU] via SUBCUTANEOUS
  Administered 2018-10-26: 16:00:00 4 [IU] via SUBCUTANEOUS
  Administered 2018-10-26: 12:00:00 7 [IU] via SUBCUTANEOUS
  Administered 2018-10-26: 21:00:00 3 [IU] via SUBCUTANEOUS
  Administered 2018-10-27: 11 [IU] via SUBCUTANEOUS
  Administered 2018-10-27 (×2): 4 [IU] via SUBCUTANEOUS
  Administered 2018-10-27: 16:00:00 1 [IU] via SUBCUTANEOUS
  Administered 2018-10-27: 04:00:00 7 [IU] via SUBCUTANEOUS
  Administered 2018-10-28: 4 [IU] via SUBCUTANEOUS
  Administered 2018-10-28: 05:00:00 3 [IU] via SUBCUTANEOUS
  Administered 2018-10-28: 7 [IU] via SUBCUTANEOUS
  Administered 2018-10-28 (×2): 3 [IU] via SUBCUTANEOUS
  Administered 2018-10-29: 7 [IU] via SUBCUTANEOUS
  Administered 2018-10-29 (×3): 4 [IU] via SUBCUTANEOUS
  Administered 2018-10-29: 11 [IU] via SUBCUTANEOUS
  Administered 2018-10-29: 4 [IU] via SUBCUTANEOUS
  Administered 2018-10-29: 04:00:00 7 [IU] via SUBCUTANEOUS
  Administered 2018-10-30 (×2): 4 [IU] via SUBCUTANEOUS
  Administered 2018-10-30: 12:00:00 7 [IU] via SUBCUTANEOUS
  Administered 2018-10-30 – 2018-10-31 (×5): 4 [IU] via SUBCUTANEOUS
  Administered 2018-10-31 (×3): 7 [IU] via SUBCUTANEOUS
  Administered 2018-11-01: 4 [IU] via SUBCUTANEOUS
  Administered 2018-11-01: 7 [IU] via SUBCUTANEOUS
  Administered 2018-11-01: 4 [IU] via SUBCUTANEOUS
  Administered 2018-11-01: 7 [IU] via SUBCUTANEOUS
  Administered 2018-11-01 – 2018-11-02 (×5): 4 [IU] via SUBCUTANEOUS
  Administered 2018-11-02 (×2): 7 [IU] via SUBCUTANEOUS
  Administered 2018-11-03 (×2): 4 [IU] via SUBCUTANEOUS
  Administered 2018-11-03: 21:00:00 2 [IU] via SUBCUTANEOUS
  Administered 2018-11-03: 08:00:00 3 [IU] via SUBCUTANEOUS
  Administered 2018-11-04 (×4): 4 [IU] via SUBCUTANEOUS
  Administered 2018-11-04 – 2018-11-05 (×4): 3 [IU] via SUBCUTANEOUS
  Administered 2018-11-05: 4 [IU] via SUBCUTANEOUS
  Administered 2018-11-05: 17:00:00 3 [IU] via SUBCUTANEOUS
  Administered 2018-11-05 (×2): 4 [IU] via SUBCUTANEOUS
  Administered 2018-11-06 – 2018-11-09 (×2): 3 [IU] via SUBCUTANEOUS
  Administered 2018-11-09: 4 [IU] via SUBCUTANEOUS
  Administered 2018-11-09: 3 [IU] via SUBCUTANEOUS
  Administered 2018-11-10 (×2): 4 [IU] via SUBCUTANEOUS
  Administered 2018-11-10: 09:00:00 3 [IU] via SUBCUTANEOUS
  Administered 2018-11-11 – 2018-11-12 (×8): 4 [IU] via SUBCUTANEOUS
  Administered 2018-11-12: 05:00:00 3 [IU] via SUBCUTANEOUS
  Administered 2018-11-12: 08:00:00 4 [IU] via SUBCUTANEOUS
  Administered 2018-11-12: 12:00:00 7 [IU] via SUBCUTANEOUS
  Administered 2018-11-13: 04:00:00 4 [IU] via SUBCUTANEOUS
  Administered 2018-11-13: 3 [IU] via SUBCUTANEOUS
  Administered 2018-11-13 (×2): 4 [IU] via SUBCUTANEOUS
  Administered 2018-11-13 – 2018-11-14 (×2): 7 [IU] via SUBCUTANEOUS
  Administered 2018-11-14: 3 [IU] via SUBCUTANEOUS
  Administered 2018-11-14: 11 [IU] via SUBCUTANEOUS
  Administered 2018-11-14: 3 [IU] via SUBCUTANEOUS
  Administered 2018-11-15: 11 [IU] via SUBCUTANEOUS
  Administered 2018-11-15: 4 [IU] via SUBCUTANEOUS
  Administered 2018-11-15: 15 [IU] via SUBCUTANEOUS
  Administered 2018-11-15: 3 [IU] via SUBCUTANEOUS
  Administered 2018-11-15: 4 [IU] via SUBCUTANEOUS
  Administered 2018-11-16: 03:00:00 7 [IU] via SUBCUTANEOUS
  Administered 2018-11-16: 11 [IU] via SUBCUTANEOUS

## 2018-09-24 MED ORDER — AMIODARONE HCL IN DEXTROSE 360-4.14 MG/200ML-% IV SOLN
60.0000 mg/h | INTRAVENOUS | Status: AC
  Administered 2018-09-24: 13:00:00 60 mg/h via INTRAVENOUS
  Filled 2018-09-24: qty 200

## 2018-09-24 MED ORDER — AMIODARONE HCL IN DEXTROSE 360-4.14 MG/200ML-% IV SOLN
INTRAVENOUS | Status: AC
  Administered 2018-09-24: 10:00:00 150 mg/h via INTRAVENOUS
  Filled 2018-09-24: qty 200

## 2018-09-24 MED ORDER — HEPARIN SODIUM (PORCINE) 1000 UNIT/ML DIALYSIS
1000.0000 [IU] | INTRAMUSCULAR | Status: DC | PRN
  Administered 2018-09-24: 13:00:00 2800 [IU] via INTRAVENOUS_CENTRAL
  Filled 2018-09-24 (×2): qty 6

## 2018-09-24 MED ORDER — PRISMASOL BGK 4/2.5 32-4-2.5 MEQ/L REPLACEMENT SOLN
Status: DC
  Administered 2018-09-24 – 2018-10-01 (×9): via INTRAVENOUS_CENTRAL
  Administered 2018-10-01: 300 mL via INTRAVENOUS_CENTRAL
  Administered 2018-10-02 – 2018-10-05 (×5): via INTRAVENOUS_CENTRAL
  Filled 2018-09-24 (×19): qty 5000

## 2018-09-24 MED ORDER — PRISMASOL BGK 4/2.5 32-4-2.5 MEQ/L REPLACEMENT SOLN
Status: DC
  Administered 2018-09-24 – 2018-10-05 (×23): via INTRAVENOUS_CENTRAL
  Filled 2018-09-24 (×30): qty 5000

## 2018-09-24 MED ORDER — MIDAZOLAM 50MG/50ML (1MG/ML) PREMIX INFUSION
0.0000 mg/h | INTRAVENOUS | Status: DC
  Administered 2018-09-24: 21:00:00 2 mg/h via INTRAVENOUS
  Administered 2018-09-24: 09:00:00 1 mg/h via INTRAVENOUS
  Filled 2018-09-24 (×2): qty 50

## 2018-09-24 MED ORDER — AMIODARONE LOAD VIA INFUSION
150.0000 mg | Freq: Once | INTRAVENOUS | Status: AC
  Administered 2018-09-24: 10:00:00 150 mg/h via INTRAVENOUS
  Filled 2018-09-24: qty 83.34

## 2018-09-24 MED ORDER — HEPARIN (PORCINE) 2000 UNITS/L FOR CRRT
INTRAVENOUS_CENTRAL | Status: DC | PRN
  Filled 2018-09-24: qty 1000

## 2018-09-24 MED ORDER — HEPARIN BOLUS VIA INFUSION (CRRT)
1000.0000 [IU] | INTRAVENOUS | Status: DC | PRN
  Filled 2018-09-24: qty 1000

## 2018-09-24 MED ORDER — CHLORHEXIDINE GLUCONATE 0.12% ORAL RINSE (MEDLINE KIT)
15.0000 mL | Freq: Two times a day (BID) | OROMUCOSAL | Status: DC
  Administered 2018-09-24 (×2): 15 mL via OROMUCOSAL

## 2018-09-24 MED ORDER — FENTANYL BOLUS VIA INFUSION
25.0000 ug | INTRAVENOUS | Status: DC | PRN
  Administered 2018-09-26: 25 ug via INTRAVENOUS
  Filled 2018-09-24: qty 25

## 2018-09-24 MED ORDER — FENTANYL 2500MCG IN NS 250ML (10MCG/ML) PREMIX INFUSION
0.0000 ug/h | INTRAVENOUS | Status: DC
  Administered 2018-09-24: 21:00:00 200 ug/h via INTRAVENOUS
  Administered 2018-09-24: 09:00:00 100 ug/h via INTRAVENOUS
  Administered 2018-09-25: 115 ug/h via INTRAVENOUS
  Administered 2018-09-26 – 2018-09-27 (×2): 100 ug/h via INTRAVENOUS
  Filled 2018-09-24 (×5): qty 250

## 2018-09-24 MED ORDER — FENTANYL BOLUS VIA INFUSION: 25.0000 ug | INTRAVENOUS | Status: DC | PRN

## 2018-09-24 MED ORDER — FUROSEMIDE 10 MG/ML IJ SOLN
30.0000 mg/h | INTRAVENOUS | Status: DC
  Administered 2018-09-24: 11:00:00 30 mg/h via INTRAVENOUS
  Filled 2018-09-24: qty 25
  Filled 2018-09-24: qty 21

## 2018-09-24 MED ORDER — MAGNESIUM SULFATE 4 GM/100ML IV SOLN
INTRAVENOUS | Status: AC
  Filled 2018-09-24: qty 100

## 2018-09-24 MED ORDER — CHLORHEXIDINE GLUCONATE 0.12% ORAL RINSE (MEDLINE KIT)
15.0000 mL | Freq: Two times a day (BID) | OROMUCOSAL | Status: DC
  Administered 2018-09-25 – 2018-09-29 (×9): 15 mL via OROMUCOSAL

## 2018-09-24 MED ORDER — NOREPINEPHRINE 4 MG/250ML-% IV SOLN: 0.0000 ug/min | INTRAVENOUS | Status: DC

## 2018-09-24 MED ORDER — FENTANYL 2500MCG IN NS 250ML (10MCG/ML) PREMIX INFUSION: 25.0000 ug/h | INTRAVENOUS | Status: DC

## 2018-09-24 MED ORDER — PRISMASOL BGK 4/2.5 32-4-2.5 MEQ/L IV SOLN
INTRAVENOUS | Status: DC
  Administered 2018-09-24 – 2018-09-30 (×26): via INTRAVENOUS_CENTRAL
  Administered 2018-09-30: 1000 mL/h via INTRAVENOUS_CENTRAL
  Administered 2018-09-30 – 2018-10-05 (×21): via INTRAVENOUS_CENTRAL
  Filled 2018-09-24 (×69): qty 5000

## 2018-09-24 MED ORDER — MIDAZOLAM HCL 2 MG/2ML IJ SOLN
1.0000 mg | INTRAMUSCULAR | Status: DC | PRN
  Administered 2018-09-26: 1 mg via INTRAVENOUS

## 2018-09-24 NOTE — Progress Notes (Signed)
Advanced Heart Failure Rounding Note   Subjective:    Had PEA arrest today and Code Blue called. Intubated. I helped transport to CCU. She is frothing with pulmonary edema in ETT tube. Runs of VT.    Objective:   Weight Range:  Vital Signs:   Temp:  [98 F (36.7 C)-98.6 F (37 C)] 98.6 F (37 C) (05/30 0446) Pulse Rate:  [34-109] 65 (05/30 0900) Resp:  [18-24] 21 (05/30 0900) BP: (89-123)/(34-74) 101/34 (05/30 0900) SpO2:  [89 %-97 %] 94 % (05/30 0900) FiO2 (%):  [100 %] 100 % (05/30 0900) Weight:  [137.8 kg] 137.8 kg (05/30 0446) Last BM Date: 09/22/18  Weight change: Filed Weights   09/22/18 1329 09/23/18 0419 09/24/18 0446  Weight: 136 kg 134.8 kg (!) 137.8 kg    Intake/Output:   Intake/Output Summary (Last 24 hours) at 09/24/2018 0916 Last data filed at 09/24/2018 0900 Gross per 24 hour  Intake 859.56 ml  Output 770 ml  Net 89.56 ml     Physical Exam: General: critically ill appearing. On vent HEENT: normal +ETT Neck: supple. JVP unable to assess due to size. Carotids 2+ bilat; no bruits. No lymphadenopathy or thryomegaly appreciated. Cor: PMI nondisplaced. Irregular rate & rhythm. No rubs, gallops or murmurs. Lungs: + crackles Abdomen: markedly obese soft, nontender, nondistended. No hepatosplenomegaly. No bruits or masses. Good bowel sounds. Extremities: no cyanosis, clubbing, rash, 2+ edema + unna boots Neuro: intubated/sedated  Telemetry: AF 70s. Personally reviewed   Labs: Basic Metabolic Panel: Recent Labs  Lab 09/22/18 1420 09/23/18 0403 09/23/18 0830 09/23/18 0831 09/23/18 0836 09/24/18 0228  NA 140 140 143 142 144 138  K 3.9 4.5 4.2 4.1 3.9 4.1  CL 101 101  --   --   --  100  CO2 29 30  --   --   --  27  GLUCOSE 153* 181*  --   --   --  155*  BUN 76* 75*  --   --   --  77*  CREATININE 2.84* 3.26*  --   --   --  3.08*  CALCIUM 9.0 8.7*  --   --   --  8.2*  MG 2.4  --   --   --   --   --     Liver Function Tests: Recent Labs   Lab 09/22/18 1420  AST 18  ALT 13  ALKPHOS 127*  BILITOT 1.2  PROT 7.2  ALBUMIN 3.2*   No results for input(s): LIPASE, AMYLASE in the last 168 hours. No results for input(s): AMMONIA in the last 168 hours.  CBC: Recent Labs  Lab 09/22/18 1420 09/23/18 0830 09/23/18 0831 09/23/18 0836  WBC 5.7  --   --   --   NEUTROABS 3.9  --   --   --   HGB 10.9* 11.9* 11.6* 10.9*  HCT 38.0 35.0* 34.0* 32.0*  MCV 85.2  --   --   --   PLT 133*  --   --   --     Cardiac Enzymes: No results for input(s): CKTOTAL, CKMB, CKMBINDEX, TROPONINI in the last 168 hours.  BNP: BNP (last 3 results) Recent Labs    09/22/18 1420  BNP 451.2*    ProBNP (last 3 results) No results for input(s): PROBNP in the last 8760 hours.    Other results:  Imaging: US Renal  Result Date: 09/22/2018 CLINICAL DATA:  Renal insufficiency. EXAM: RENAL / URINARY TRACT ULTRASOUND COMPLETE COMPARISON:  None. FINDINGS: Right Kidney: Renal measurements: 11.6 x 4.7 x 4.8 cm = volume: 136.1 mL. Increased renal echogenicity but relatively normal renal cortical thickness and no focal lesions or hydronephrosis. Left Kidney: Renal measurements: 11.2 x 5.6 x 5.1 cm = volume: 166.8 mL. Increased renal echogenicity but relatively normal renal cortical thickness and no focal lesions or hydronephrosis. Bladder: Nondistended bladder.  Patient voided before the exam. IMPRESSION: Increased renal echogenicity suggesting medical renal disease but no focal renal lesions, hydronephrosis or renal calculi. Nondistended bladder. Electronically Signed   By: Marijo Sanes M.D.   On: 09/22/2018 19:11   Dg Chest Port 1 View  Result Date: 09/23/2018 CLINICAL DATA:  Central line placement. EXAM: PORTABLE CHEST 1 VIEW COMPARISON:  09/13/2018. FINDINGS: Apical lordotic view obtained. Prominence of superior mediastinum most likely related to prominent great vessels and apical lordotic projection. Left sided PICC line noted with tip over superior  vena cava. Cardiomegaly with diffuse bilateral pulmonary interstitial prominence. Findings suggest mild CHF. Pneumonitis cannot be excluded. Tiny bilateral pleural effusions cannot be excluded. No pneumothorax. IMPRESSION: 1.  Left PICC line noted over superior vena cava. 2. Cardiomegaly with mild bilateral interstitial prominence suggesting mild CHF. Pneumonitis cannot be excluded. Tiny bilateral pleural effusions cannot be excluded. Electronically Signed   By: Marcello Moores  Register   On: 09/23/2018 12:25   [place] Ekg Site Rite  Result Date: 09/23/2018 If Site Rite image not attached, placement could not be confirmed due to current cardiac rhythm.     Medications:     Scheduled Medications: . apixaban  5 mg Oral BID  . atorvastatin  40 mg Oral q1800  . fentaNYL (SUBLIMAZE) injection  25 mcg Intravenous Once  . gabapentin  300 mg Oral BID WC  . gabapentin  600 mg Oral QHS  . insulin aspart  0-9 Units Subcutaneous TID WC  . insulin detemir  65 Units Subcutaneous Daily  . midazolam      . midazolam      . sodium chloride flush  3 mL Intravenous Q12H  . sodium chloride flush  3 mL Intravenous Q12H     Infusions: . sodium chloride    . sodium chloride    . fentaNYL infusion INTRAVENOUS    . furosemide    . furosemide    . midazolam    . milrinone 0.25 mcg/kg/min (09/24/18 0900)     PRN Medications:  sodium chloride, sodium chloride, acetaminophen, fentaNYL, midazolam, midazolam, ondansetron (ZOFRAN) IV, sodium chloride flush, sodium chloride flush, sodium chloride flush   Assessment/Plan:   1. PEA arrest on 5/30 - due to hypoxia/pulmonary edema. - needs volume off - will place on high dose lasix gtt and continue milrinone. If no response will need CVVHD. D/w Renal as curbside  2. Acute on chronic hypoxic respiratory failure - due to OHS/OSA  - intubated on 5/30 - sleep study 4/15 without OSA (AHI 1.4) but had nocturnal desat - likely need to repeat. Will d/w Dr. Radford Pax   3.   Acute on chronic diastolic HF - She has markedly elevated filling pressures complicated by severe cardiorenal syndrome - Plan as above. Continue milrinone and IV diuresis. Lasix gtt at 30 with metolazone 5 bid - May need HD to manage fluid status. Likely not long-term HD candidate  4. Pulmonary HTN, severe  - this is mostly pulmonary venous HTN by cath [facility]) but also has a component of OHS/OSA (WHO group 3) - no role for selective pulmonary vasodilators - will improve  signiccantly with volume removal - consider repeat RHC after volume removal   5. AKI on CKD 3 - baseline creatinine 1.9. Peaked at 3.2 -> 3.0 - likely cardiorenal - hopefully will improve with milrinone but may need HD to control volume stauts.s ee above  6. Morbid obesity - desperately needs weight loss  6. Chronic AF - rate is slow. Hold apixaban for possible HD cath. Switch to heparin  7. VT/NSVT - start amio  - Keep K> 4.0 Mg 2.0   Length of Stay: 2   Glori Bickers MD 09/24/2018, 9:16 AM  Advanced Heart Failure Team Pager [phone] (M-F; [place])  Please contact [facility] for night-coverage after hours (4p -7a ) and weekends on [email or web address]

## 2018-09-24 NOTE — Progress Notes (Signed)
ABG results called to Dr. Loanne Drilling. Verbal order received to increase RR to 25 and to draw a repeat ABG in 2 hours.

## 2018-09-24 NOTE — CV Procedure (Signed)
Radial arterial line placement.    Emergent consent assumed. The left wrist was prepped and draped in the routine sterile fashion. I was able to cannulate the radial artery twice but unable to feed the wire. I looked under u/s and left radial artery very small. The left ulnar artery was more prominent. So using u/s guidance a single lumen radial arterial catheter was placed in the left ulnar artery using a modified Seldinger technique. Good blood flow and wave forms. A dressing was placed.     Glori Bickers, MD  12:51 PM

## 2018-09-24 NOTE — Progress Notes (Signed)
Assisted tele visit to patient with family Lashunda Greis RN  

## 2018-09-24 NOTE — Progress Notes (Signed)
Orthopedic Tech Progress Note Patient Details:  Kathleen [name] Nov 29, 1950 [id number]  Ortho Devices Type of Ortho Device: Haematologist Ortho Device/Splint Location: Bilateral unna boots Ortho Device/Splint Interventions: Application   Post Interventions Patient Tolerated: Well Instructions Provided: Care of device   [name] 09/24/2018, 5:36 PM

## 2018-09-24 NOTE — Progress Notes (Signed)
Physical Therapy Note  Acknowledged events from this morning with PEA. Will d/c PT services and original plan of care at this time. Please re-order physical therapy as needed, when patient is medically appropriate for continued care.  Acute Rehab Ellouise Newer, PT

## 2018-09-24 NOTE — Progress Notes (Signed)
Bed side report done at this time with Legrand Como, RN. Patient sleeping in bed.

## 2018-09-24 NOTE — Progress Notes (Signed)
Report given to RN on 2H at this time.

## 2018-09-24 NOTE — Progress Notes (Signed)
Unable to obtain post intubation ABG at this time. Sterile procedure being performed.

## 2018-09-24 NOTE — CV Procedure (Signed)
Central Venous Catheter Insertion Procedure Note Kathleen [name] [id number] Apr 02, 1951    Procedure: Insertion of Central Venous Catheter Indications: Dialysis   Procedure Details Consent: Risks of procedure as well as the alternatives and risks of each were explained to the (patient/caregiver).  Consent for procedure obtained. Time Out: Verified patient identification, verified procedure, site/side was marked, verified correct patient position, special equipment/implants available, medications/allergies/relevent history reviewed, required imaging and test results available.  Performed   Maximum sterile technique was used including antiseptics, cap, gloves, gown, hand hygiene, mask and sheet. Skin prep: Chlorhexidine; local anesthetic administered A antimicrobial bonded/coated triple lumen catheter was placed in the right internal jugular vein using the Seldinger technique and u/s guidance.   Evaluation Blood flow good Complications: No apparent complications Patient did tolerate procedure well. Chest X-ray ordered to verify placement.  CXR: pending   Glori Bickers, MD  1:00 PM

## 2018-09-24 NOTE — Consult Note (Signed)
NAME:  Kathleen [name], MRN:  [id number], DOB:  [birth year], LOS: 2 ADMISSION DATE:  09/22/2018, CONSULTATION DATE: 09/24/2018 REFERRING MD: Candee Furbish MD, CHIEF COMPLAINT: Cardiac arrest  Brief History   68-year-old with history of permanent atrial fibrillation, hypertension, super morbid obesity, diabetes, dyslipidemia, OHS, CKD, diastolic heart failure admitted on 5/28 with acute on chronic diastolic heart failure, failed outpatient diuretics management due to chronic kidney disease.  Right heart cath on 5/29 showed markedly elevated biventricular filling pressures with normal cardiac output.  She is on cardiology service, maintained on milrinone and Lasix The morning of 5/30 she went into PEA arrest, CPR performed for 8 minutes.  Intubated and transferred to ICU. PCCM consulted for management of vent and sedation  Past Medical History   has a past medical history of Atrial flutter ([place]), Bradycardia, Chronic diastolic (congestive) heart failure ([place]) (01/10/2015), CKD (chronic kidney disease), stage IV ([place]), Degenerative joint disease of hand, Diabetes mellitus, Dyslipidemia, Fecal occult blood test positive, GERD (gastroesophageal reflux disease), Headache, Hypertension, Inadequate material resources, Irritable bowel syndrome, Morbid obesity ([place]), Obesity hypoventilation syndrome ([place]), Post-menopausal bleeding, and Shortness of breath dyspnea.  Significant Hospital Events   5/28 Admit 5/29 RHC 5/30 PEA arrest, intubated  Consults:  PCCM  Procedures:  Lt PICC 5/29 >> OETT 5/20 >>  Significant Diagnostic Tests:  Right heart cath 5/29 RA = 24 RV = 94/26 PA = 95/36 (53) PCW = 30 (v = 50) Fick cardiac output/index = 6.0/2.7 PVR =3.9 WU FA sat = 91% PA sat = 58%, 58% SVC 60%  Chest x-ray 5/29- PICC line in place, cardiomegaly with bilateral interstitial prominence.  I have reviewed the images personally.  Micro Data:    Antimicrobials:    Interim history/subjective:     Objective   Blood pressure (!) 112/51, pulse 72, temperature 98.6 F (37 C), temperature source Oral, resp. rate (!) 22, weight (!) 137.8 kg, SpO2 97 %. CVP:  [17 mmHg-21 mmHg] 20 mmHg  FiO2 (%):  [100 %] 100 %   Intake/Output Summary (Last 24 hours) at 09/24/2018 0902 Last data filed at 09/24/2018 0830 Gross per 24 hour  Intake 809.54 ml  Output 770 ml  Net 39.54 ml   Filed Weights   09/22/18 1329 09/23/18 0419 09/24/18 0446  Weight: 136 kg 134.8 kg (!) 137.8 kg    Examination: Gen:      No acute distress, morbidly obese HEENT:  EOMI, sclera anicteric, ETT Neck:     No masses; no thyromegaly Lungs:    Clear to auscultation bilaterally; normal respiratory effort CV:         Regular rate and rhythm; no murmurs Abd:      + bowel sounds; soft, non-tender; no palpable masses, no distension Ext: Bilateral lower extremities in bandages. Skin:      Warm and dry; no rash Neuro: Agitated on the vent, moving all extremities.  Nurse reports she responds to commands.  Resolved Hospital Problem list     Assessment & Plan:  68 year old with morbid obesity, acute on chronic diastolic heart failure, pulmonary hypertension transferred to the ICU after PEA arrest.  Suspect respiratory failure due to pulmonary edema  Cardiac arrest Defer hypothermia protocol as she is purposeful Continue telemetry monitoring  Respiratory failure, pulmonary edema Chest x-ray, ABG Full vent support for now  Acute on chronic diastolic heart failure Severe pulmonary hypertension Chronic A. fib On milrinone and Lasix Management per cardiology Apixiban on hold  AKI on chronic kidney disease Likely  cardiorenal syndrome Monitor urine output and creatinine May need CVVH if diuresis is not successful  Sedation management Start fentanyl drip, Versed PRN RASS goal  -1  Diabetes SSI, Levemir  Best practice:  Diet: NPO Pain/Anxiety/Delirium protocol (if indicated): Fentanyl drip, PRN Versed VAP  protocol (if indicated): Yes DVT prophylaxis: SCDs. Apixiban on hold GI prophylaxis: Pepcid Glucose control: SSI, Levemir Mobility: Bed Code Status: Full Family Communication: Per primary Disposition: ICU  Labs   CBC: Recent Labs  Lab 09/22/18 1420 09/23/18 0830 09/23/18 0831 09/23/18 0836  WBC 5.7  --   --   --   NEUTROABS 3.9  --   --   --   HGB 10.9* 11.9* 11.6* 10.9*  HCT 38.0 35.0* 34.0* 32.0*  MCV 85.2  --   --   --   PLT 133*  --   --   --     Basic Metabolic Panel: Recent Labs  Lab 09/22/18 1420 09/23/18 0403 09/23/18 0830 09/23/18 0831 09/23/18 0836 09/24/18 0228  NA 140 140 143 142 144 138  K 3.9 4.5 4.2 4.1 3.9 4.1  CL 101 101  --   --   --  100  CO2 29 30  --   --   --  27  GLUCOSE 153* 181*  --   --   --  155*  BUN 76* 75*  --   --   --  77*  CREATININE 2.84* 3.26*  --   --   --  3.08*  CALCIUM 9.0 8.7*  --   --   --  8.2*  MG 2.4  --   --   --   --   --    GFR: Estimated Creatinine Clearance: 23.4 mL/min (A) (by C-G formula based on SCr of 3.08 mg/dL (H)). Recent Labs  Lab 09/22/18 1420  WBC 5.7    Liver Function Tests: Recent Labs  Lab 09/22/18 1420  AST 18  ALT 13  ALKPHOS 127*  BILITOT 1.2  PROT 7.2  ALBUMIN 3.2*   No results for input(s): LIPASE, AMYLASE in the last 168 hours. No results for input(s): AMMONIA in the last 168 hours.  ABG    Component Value Date/Time   PHART 7.354 05/23/2013 0227   PCO2ART 58.2 (HH) 05/23/2013 0227   PO2ART 98.0 05/23/2013 0227   HCO3 29.9 (H) 09/23/2018 0836   TCO2 32 09/23/2018 0836   O2SAT 60.0 09/23/2018 0836     Coagulation Profile: No results for input(s): INR, PROTIME in the last 168 hours.  Cardiac Enzymes: No results for input(s): CKTOTAL, CKMB, CKMBINDEX, TROPONINI in the last 168 hours.  HbA1C: Hemoglobin A1C  Date/Time Value Ref Range Status  09/13/2018 11:06 AM 7.4 (A) 4.0 - 5.6 % Final  06/07/2018 10:44 AM 7.0 (A) 4.0 - 5.6 % Final   Hgb A1c MFr Bld  Date/Time  Value Ref Range Status  01/11/2015 03:48 PM 6.9 (H) 4.8 - 5.6 % Final    Comment:    (NOTE)         Pre-diabetes: 5.7 - 6.4         Diabetes: >6.4         Glycemic control for adults with diabetes: <7.0   05/08/2010 01:39 PM 5.9 %     CBG: Recent Labs  Lab 09/23/18 1642 09/23/18 1859 09/23/18 2137 09/24/18 0625 09/24/18 0811  GLUCAP 171* 168* 158* 153* 174*    Review of Systems:   Unable to obtain due to intubated  status  Past Medical History  She,  has a past medical history of Atrial flutter ([place]), Bradycardia, Chronic diastolic (congestive) heart failure ([place]) (01/10/2015), CKD (chronic kidney disease), stage IV ([place]), Degenerative joint disease of hand, Diabetes mellitus, Dyslipidemia, Fecal occult blood test positive, GERD (gastroesophageal reflux disease), Headache, Hypertension, Inadequate material resources, Irritable bowel syndrome, Morbid obesity ([place]), Obesity hypoventilation syndrome ([place]), Post-menopausal bleeding, and Shortness of breath dyspnea.   Surgical History    Past Surgical History:  Procedure Laterality Date  . CHOLECYSTECTOMY    . EYE SURGERY     left lens implant s/p cataracts  . OTHER SURGICAL HISTORY     right shoulder tendon repair 05/2011  . RIGHT HEART CATH N/A 09/23/2018   Procedure: RIGHT HEART CATH;  Surgeon: Jolaine Artist, MD;  Location: [facility];  Service: Cardiovascular;  Laterality: N/A;     Social History   reports that she has never smoked. She has never used smokeless tobacco. She reports that she does not drink alcohol or use drugs.   Family History   Her family history includes Diabetes in her mother and sister; Obesity in her mother; Stroke in her father. There is no history of Colon cancer.   Allergies Allergies  Allergen Reactions  . Doxycycline     REACTION: Wheals and pruritus     Home Medications  Prior to Admission medications   Medication Sig Start Date End Date Taking? Authorizing Provider   ACCU-CHEK AVIVA PLUS test strip TEST BLOOD SUGAR THREE TIMES DAILY Patient taking differently: 1 each by Other route 3 (three) times daily.  09/20/18   Aldine Contes, MD  Accu-Chek FastClix Lancets MISC TEST BLOOD SUGAR THREE TIMES DAILY 09/06/18   Aldine Contes, MD  Accu-Chek Softclix Lancets lancets TEST BLOOD SUGAR THREE TIMES DAILY Patient taking differently: 1 each 3 (three) times daily.  09/20/18   Aldine Contes, MD  apixaban (ELIQUIS) 5 MG TABS tablet Take 1 tablet (5 mg total) by mouth 2 (two) times daily. 06/07/18   Aldine Contes, MD  atorvastatin (LIPITOR) 40 MG tablet Take 1 tablet (40 mg total) by mouth daily. 02/14/18   Aldine Contes, MD  Blood Glucose Monitoring Suppl (ACCU-CHEK AVIVA PLUS) w/Device KIT TEST BLOOD SUGAR THREE TIMES DAILY 09/05/18   Aldine Contes, MD  gabapentin (NEURONTIN) 300 MG capsule TAKE 1 CAPSULE EVERY MORNING, TAKE 1 CAPSULE IN THE AFTERNOON, AND TAKE 2 CAPSULES EVERY NIGHT 09/06/18   Aldine Contes, MD  insulin detemir (LEVEMIR) 100 UNIT/ML injection Inject 65 units total into the skin once daily. 02/23/18   [place], Carley D, DO  Insulin Pen Needle (B-D UF III MINI PEN NEEDLES) 31G X 5 MM MISC USE ONE TIME DAILY TO INJECT VICTOZA. DX:E11.9 09/06/18   Aldine Contes, MD  Insulin Syringe-Needle U-100 (BD VEO INSULIN SYRINGE U/F) 31G X 15/64" 1 ML MISC USE TO INJECT INSULIN ONE TIME A DAY 06/03/18   Aldine Contes, MD  liraglutide (VICTOZA) 18 MG/3ML SOPN Inject 0.3 mLs (1.8 mg total) into the skin daily. 02/09/18   Se[place]ell, Jaimie A, DO  metFORMIN (GLUCOPHAGE) 500 MG tablet Take 1 tablet (500 mg total) by mouth 2 (two) times daily with a meal. 09/13/18   Aldine Contes, MD  Multiple Vitamins-Minerals (HEALTHY EYES PO) Take 1 tablet by mouth daily. Reported on 05/17/2015    [provider]  NON FORMULARY Place 2 L into the nose daily. Wears with exertion and qhs    [provider]    The  patient is critically ill  with multiple organ system failure and requires high complexity decision making for assessment and support, frequent evaluation and titration of therapies, advanced monitoring, review of radiographic studies and interpretation of complex data.   Critical Care Time devoted to patient care services, exclusive of separately billable procedures, described in this note is 35 minutes.   Marshell Garfinkel MD [facility] Pager [phone] If no answer call [phone] 09/24/2018, 9:19 AM

## 2018-09-24 NOTE — Anesthesia Procedure Notes (Signed)
Procedure Name: Intubation Date/Time: 09/24/2018 8:12 AM Performed by: Clearnce Sorrel, CRNA Pre-anesthesia Checklist: Patient identified, Emergency Drugs available, Suction available, Patient being monitored and Timeout performed Patient Re-evaluated:Patient Re-evaluated prior to induction Laryngoscope Size: Glidescope and 4 Grade View: Grade I Tube type: Oral Tube size: 8.0 mm Number of attempts: 1 Airway Equipment and Method: Stylet Placement Confirmation: ETT inserted through vocal cords under direct vision,  CO2 detector and breath sounds checked- equal and bilateral Secured at: 23 cm Tube secured with: Tape Dental Injury: Teeth and Oropharynx as per pre-operative assessment

## 2018-09-24 NOTE — Significant Event (Signed)
Patient's daughter in law and niece updated on several occasions today by RN and then by Dr. Haroldine Laws who spoke with patient's niece Judeen Hammans.   Tele-visit conference with patient's niece. Patient's daughter in law and son given the link as well but does not want to tele-visit at this time.     Kathleen [name]

## 2018-09-24 NOTE — Code Documentation (Signed)
  Patient Name: Kathleen [name]   MRN: [id number]   Date of Birth/ Sex: 01/28/1951 , female      Admission Date: 09/22/2018  Attending Provider: Jettie Booze, MD  Primary Diagnosis: <principal problem not specified>   Indication: Pt was in her usual state of health until this AM, when she was noted to brady down to PEA. Code blue was subsequently called. At the time of arrival on scene, ACLS protocol was underway.  Technical Description:  - CPR performance duration:  8 minutes  - Was defibrillation or cardioversion used? No   - Was external pacer placed? No  - Was patient intubated pre/post CPR? Yes   Medications Administered: Y = Yes; Blank = No Amiodarone    Atropine    Calcium    Epinephrine  Y x2  Lidocaine    Magnesium    Norepinephrine    Phenylephrine    Sodium bicarbonate  Y  Vasopressin     Post CPR evaluation:  - Final Status - Was patient successfully resuscitated ? Yes - What is current rhythm? A.fib w/ PVCs - What is current hemodynamic status? Bp 80/50  Miscellaneous Information: - Labs sent, including: ABG  - Primary team notified?  Yes  - Family Notified? Yes  - Additional notes/ transfer status:  Transfer to 2H     Mosetta Anis, MD  09/24/2018, 8:28 AM

## 2018-09-24 NOTE — Progress Notes (Signed)
Initial Nutrition Assessment  RD working remotely.  DOCUMENTATION CODES:   Morbid obesity  INTERVENTION:   -If unable to extubate within 48 hours, recommend:  Initiate TF via OGT with Vital High Protein at goal rate of 41 ml/h (984 ml per day) and Prostat 30 ml daily to provide 1080 kcals, 101 gm protein, 823 ml free water daily.  NUTRITION DIAGNOSIS:   Inadequate oral intake related to inability to eat as evidenced by NPO status.  GOAL:   Provide needs based on ASPEN/SCCM guidelines  MONITOR:   Vent status, Labs, Weight trends, Skin, I & O's  REASON FOR ASSESSMENT:   Ventilator    ASSESSMENT:   68 year old with morbid obesity, acute on chronic diastolic heart failure, pulmonary hypertension transferred to the ICU after PEA arrest.  Suspect respiratory failure due to pulmonary edema  5/30- s/p PEA arrest, intubated and transferred to ICU, CVVH initiated  Patient is currently intubated on ventilator support. OGT placed.  MV: 6.7 L/min Temp (24hrs), Avg:98.4 F (36.9 C), Min:98 F (36.7 C), Max:98.6 F (37 C)  Reviewed I/O's: +290 ml x 24 hours and -371 ml x 24 hours  UOP: 520 ml x 24 hours  MAP: 50  Per PCCM notes, hypothermia protocol deferred.   Per previous RD notes, pt consuming 100% of meals prior to ICU transfer. Pt was consuming 23 meals per day, but was using salt substitutes (Nu-Salt) to season foods.   Medications reviewed and include amiodarone, fentanyl, heparin, midazolam, milrinone, and norepinephrine.   Lab Results  Component Value Date   HGBA1C 7.4 (A) 09/13/2018   PTA DM medications are 1.8 mg victoza daily and 36 units insulin detemir daily.   Labs reviewed: CBGS: 153-174 (inpatient orders for glycemic control are 65 units insulin detemir daily and 0-9 units insulin aspart TID with meals).   NUTRITION - FOCUSED PHYSICAL EXAM:    Most Recent Value  Orbital Region  Unable to assess  Upper Arm Region  Unable to assess  Thoracic and  Lumbar Region  Unable to assess  Buccal Region  Unable to assess  Temple Region  Unable to assess  Clavicle Bone Region  Unable to assess  Clavicle and Acromion Bone Region  Unable to assess  Scapular Bone Region  Unable to assess  Dorsal Hand  Unable to assess  Patellar Region  Unable to assess  Anterior Thigh Region  Unable to assess  Posterior Calf Region  Unable to assess  Edema (RD Assessment)  Unable to assess  Hair  Unable to assess  Eyes  Unable to assess  Mouth  Unable to assess  Skin  Unable to assess  Nails  Unable to assess       Diet Order:   Diet Order            Diet renal/carb modified with fluid restriction Diet-HS Snack? Nothing; Fluid restriction: 1200 mL Fluid; Room service appropriate? Yes; Fluid consistency: Thin  Diet effective now              EDUCATION NEEDS:   No education needs have been identified at this time  Skin:  Skin Assessment: Reviewed RN Assessment  Last BM:  09/22/18  Height:   Ht Readings from Last 1 Encounters:  09/22/18 5' (1.524 m)    Weight:   Wt Readings from Last 1 Encounters:  09/24/18 (!) 137.8 kg    Ideal Body Weight:  45.5 kg  BMI:  Body mass index is 59.33 kg/m.  Estimated Nutritional Needs:  Kcal:  1000-1136  Protein:  91-114 grams  Fluid:  per MD    Tinna Kolker A. Jimmye Norman, RD, LDN, [place] Registered Dietitian II Certified Diabetes Care and Education Specialist Pager: [phone] After hours Pager: [phone]

## 2018-09-25 DIAGNOSIS — I272 Pulmonary hypertension, unspecified: Secondary | ICD-10-CM

## 2018-09-25 DIAGNOSIS — E8770 Fluid overload, unspecified: Secondary | ICD-10-CM

## 2018-09-25 DIAGNOSIS — I482 Chronic atrial fibrillation, unspecified: Secondary | ICD-10-CM

## 2018-09-25 DIAGNOSIS — Z7901 Long term (current) use of anticoagulants: Secondary | ICD-10-CM

## 2018-09-25 DIAGNOSIS — J9622 Acute and chronic respiratory failure with hypercapnia: Secondary | ICD-10-CM

## 2018-09-25 LAB — RENAL FUNCTION PANEL
Albumin: 2.9 g/dL — ABNORMAL LOW (ref 3.5–5.0)
Albumin: 2.8 g/dL — ABNORMAL LOW (ref 3.5–5.0)
Anion gap: 12 (ref 5–15)
Anion gap: 14 (ref 5–15)
BUN: 54 mg/dL — ABNORMAL HIGH (ref 8–23)
BUN: 39 mg/dL — ABNORMAL HIGH (ref 8–23)
CO2: 26 mmol/L (ref 22–32)
CO2: 25 mmol/L (ref 22–32)
Calcium: 8.3 mg/dL — ABNORMAL LOW (ref 8.9–10.3)
Calcium: 8.4 mg/dL — ABNORMAL LOW (ref 8.9–10.3)
Chloride: 101 mmol/L (ref 98–111)
Chloride: 100 mmol/L (ref 98–111)
Creatinine, Ser: 2.41 mg/dL — ABNORMAL HIGH (ref 0.44–1.00)
Creatinine, Ser: 1.97 mg/dL — ABNORMAL HIGH (ref 0.44–1.00)
GFR calc Af Amer: 23 mL/min — ABNORMAL LOW (ref >60)
GFR calc Af Amer: 30 mL/min — ABNORMAL LOW (ref >60)
GFR calc non Af Amer: 20 mL/min — ABNORMAL LOW (ref >60)
GFR calc non Af Amer: 26 mL/min — ABNORMAL LOW (ref >60)
Glucose, Bld: 124 mg/dL — ABNORMAL HIGH (ref 70–99)
Glucose, Bld: 137 mg/dL — ABNORMAL HIGH (ref 70–99)
Phosphorus: 3.1 mg/dL (ref 2.5–4.6)
Phosphorus: 3.1 mg/dL (ref 2.5–4.6)
Potassium: 3.8 mmol/L (ref 3.5–5.1)
Potassium: 4.5 mmol/L (ref 3.5–5.1)
Sodium: 139 mmol/L (ref 135–145)
Sodium: 139 mmol/L (ref 135–145)

## 2018-09-25 LAB — POCT ACTIVATED CLOTTING TIME
Activated Clotting Time: 169 seconds
Activated Clotting Time: 175 seconds
Activated Clotting Time: 180 seconds
Activated Clotting Time: 186 seconds
Activated Clotting Time: 186 seconds
Activated Clotting Time: 191 seconds
Activated Clotting Time: 197 seconds
Activated Clotting Time: 202 seconds
Activated Clotting Time: 202 seconds
Activated Clotting Time: 202 seconds
Activated Clotting Time: 208 seconds

## 2018-09-25 LAB — POCT I-STAT 7, (LYTES, BLD GAS, ICA,H+H)
Acid-Base Excess: 1 mmol/L (ref 0.0–2.0)
Bicarbonate: 27.6 mmol/L (ref 20.0–28.0)
Calcium, Ion: 1.15 mmol/L (ref 1.15–1.40)
HCT: 35 % — ABNORMAL LOW (ref 36.0–46.0)
Hemoglobin: 11.9 g/dL — ABNORMAL LOW (ref 12.0–15.0)
O2 Saturation: 97 %
Patient temperature: 97
Potassium: 3.8 mmol/L (ref 3.5–5.1)
Sodium: 140 mmol/L (ref 135–145)
TCO2: 29 mmol/L (ref 22–32)
pCO2 arterial: 50.2 mmHg — ABNORMAL HIGH (ref 32.0–48.0)
pH, Arterial: 7.344 — ABNORMAL LOW (ref 7.350–7.450)
pO2, Arterial: 90 mmHg (ref 83.0–108.0)

## 2018-09-25 LAB — APTT
aPTT: 75 seconds — ABNORMAL HIGH (ref 24–36)
aPTT: 91 seconds — ABNORMAL HIGH (ref 24–36)

## 2018-09-25 LAB — GLUCOSE, CAPILLARY
Glucose-Capillary: 107 mg/dL — ABNORMAL HIGH (ref 70–99)
Glucose-Capillary: 112 mg/dL — ABNORMAL HIGH (ref 70–99)
Glucose-Capillary: 119 mg/dL — ABNORMAL HIGH (ref 70–99)
Glucose-Capillary: 126 mg/dL — ABNORMAL HIGH (ref 70–99)

## 2018-09-25 LAB — COOXEMETRY PANEL
Carboxyhemoglobin: 2 % — ABNORMAL HIGH (ref 0.5–1.5)
Methemoglobin: 1.1 % (ref 0.0–1.5)
O2 Saturation: 83.7 %
Total hemoglobin: 10.8 g/dL — ABNORMAL LOW (ref 12.0–16.0)

## 2018-09-25 LAB — MAGNESIUM: Magnesium: 2.3 mg/dL (ref 1.7–2.4)

## 2018-09-25 MED ORDER — HEPARIN (PORCINE) 25000 UT/250ML-% IV SOLN
800.0000 [IU]/h | INTRAVENOUS | Status: DC
  Administered 2018-09-25 – 2018-09-27 (×3): 1300 [IU]/h via INTRAVENOUS
  Administered 2018-09-27: 1200 [IU]/h via INTRAVENOUS
  Administered 2018-09-29 – 2018-09-30 (×2): 900 [IU]/h via INTRAVENOUS
  Administered 2018-10-01 – 2018-10-02 (×2): 800 [IU]/h via INTRAVENOUS
  Filled 2018-09-25 (×9): qty 250

## 2018-09-25 MED ORDER — CHLORHEXIDINE GLUCONATE CLOTH 2 % EX PADS
6.0000 | MEDICATED_PAD | Freq: Every day | CUTANEOUS | Status: DC
  Administered 2018-09-25: 10:00:00 6 via TOPICAL

## 2018-09-25 MED ORDER — CHLORHEXIDINE GLUCONATE CLOTH 2 % EX PADS
6.0000 | MEDICATED_PAD | Freq: Every day | CUTANEOUS | Status: DC
  Administered 2018-09-26 – 2018-10-12 (×17): 6 via TOPICAL

## 2018-09-25 MED ORDER — GABAPENTIN 100 MG PO CAPS
100.0000 mg | ORAL_CAPSULE | Freq: Two times a day (BID) | ORAL | Status: DC
  Administered 2018-09-26: 10:00:00 100 mg via ORAL
  Filled 2018-09-25: qty 1

## 2018-09-25 MED FILL — Medication: Qty: 1 | Status: AC

## 2018-09-25 NOTE — Progress Notes (Signed)
Assisted family with camera/video time with family

## 2018-09-25 NOTE — Progress Notes (Signed)
Notified Dr. Haroldine Laws of patient's heart rate often dropping to 47-49, but then increasing back to 50-55. Stated to turn off amio drip for now, but to restart if amount of ectopy increases. Will continue to monitor.  Joellen Jersey, RN

## 2018-09-25 NOTE — Progress Notes (Signed)
Advanced Heart Failure Rounding Note   Subjective:    Had PEA arrest yesterday due to hypoxia and pulmonary edema.   Now sedated on vent with CVVHD with good volume removal at 200/hr. Weight down 12 pounds.   Remains on milrinone and NE 22 to support BP. Having frequents PVCs and NSVT.    Objective:   Weight Range:  Vital Signs:   Temp:  [96.2 F (35.7 C)-98.5 F (36.9 C)] 97.5 F (36.4 C) (05/31 0723) Pulse Rate:  [38-119] 55 (05/31 1045) Resp:  [12-29] 15 (05/31 1045) BP: (50-116)/(28-78) 93/42 (05/31 1000) SpO2:  [88 %-100 %] 96 % (05/31 1045) Arterial Line BP: (95-264)/(11-67) 128/52 (05/31 1045) FiO2 (%):  [40 %-100 %] 60 % (05/31 1030) Weight:  [132.2 kg] 132.2 kg (05/31 0500) Last BM Date: 09/22/18  Weight change: Filed Weights   09/23/18 0419 09/24/18 0446 09/25/18 0500  Weight: 134.8 kg (!) 137.8 kg 132.2 kg    Intake/Output:   Intake/Output Summary (Last 24 hours) at 09/25/2018 1054 Last data filed at 09/25/2018 1000 Gross per 24 hour  Intake 2026.93 ml  Output 5316 ml  Net -3289.07 ml     Physical Exam: General: critically ill appearing. On vent HEENT: normal + ETT Neck: supple.+ RIJ trialysis Carotids 2+ bilat; no bruits. No lymphadenopathy or thryomegaly appreciated. Cor: PMI nondisplaced. Irregular rate & rhythm. No rubs, gallops or murmurs. Lungs: clear Abdomen: obese soft, nontender, nondistended. No hepatosplenomegaly. No bruits or masses. Good bowel sounds. Extremities: no cyanosis, clubbing, rash, 2-3+  edema Neuro: sedated on vent   Telemetry: AF 50-60s with PVCs/NSVT/bigeminy. Personally reviewed   Labs: Basic Metabolic Panel: Recent Labs  Lab 09/22/18 1420 09/23/18 0403  09/24/18 0228 09/24/18 1320 09/24/18 1551 09/24/18 1600 09/25/18 0400 09/25/18 0402  NA 140 140   < > 138 138 139 137 139 140  K 3.9 4.5   < > 4.1 4.0 4.0 4.0 3.8 3.8  CL 101 101  --  100  --   --  98 101  --   CO2 29 30  --  27  --   --  24 26  --    GLUCOSE 153* 181*  --  155*  --   --  212* 124*  --   BUN 76* 75*  --  77*  --   --  75* 54*  --   CREATININE 2.84* 3.26*  --  3.08*  --   --  3.05* 2.41*  --   CALCIUM 9.0 8.7*  --  8.2*  --   --  8.4* 8.3*  --   MG 2.4  --   --   --   --   --   --  2.3  --   PHOS  --   --   --   --   --   --  4.1 3.1  --    < > = values in this interval not displayed.    Liver Function Tests: Recent Labs  Lab 09/22/18 1420 09/24/18 1600 09/25/18 0400  AST 18  --   --   ALT 13  --   --   ALKPHOS 127*  --   --   BILITOT 1.2  --   --   PROT 7.2  --   --   ALBUMIN 3.2* 2.9* 2.9*   No results for input(s): LIPASE, AMYLASE in the last 168 hours. No results for input(s): AMMONIA in the last 168 hours.  CBC:  Recent Labs  Lab 09/22/18 1420  09/23/18 0831 09/23/18 0836 09/24/18 1320 09/24/18 1551 09/25/18 0402  WBC 5.7  --   --   --   --   --   --   NEUTROABS 3.9  --   --   --   --   --   --   HGB 10.9*   < > 11.6* 10.9* 11.2* 11.2* 11.9*  HCT 38.0   < > 34.0* 32.0* 33.0* 33.0* 35.0*  MCV 85.2  --   --   --   --   --   --   PLT 133*  --   --   --   --   --   --    < > = values in this interval not displayed.    Cardiac Enzymes: No results for input(s): CKTOTAL, CKMB, CKMBINDEX, TROPONINI in the last 168 hours.  BNP: BNP (last 3 results) Recent Labs    09/22/18 1420  BNP 451.2*    ProBNP (last 3 results) No results for input(s): PROBNP in the last 8760 hours.    Other results:  Imaging: Dg Chest Port 1 View  Result Date: 09/24/2018 CLINICAL DATA:  Central line placement.  History of CHF. EXAM: PORTABLE CHEST 1 VIEW COMPARISON:  Sep 24, 2018 FINDINGS: A left PICC line terminates near the confluence of the brachiocephalic veins. The ETT is in good position. A new right central line terminates in the SVC. No pneumothorax. Increasing bilateral pulmonary opacities, more focal in the right. NG tube terminates below today's film. No other acute abnormalities. IMPRESSION: 1. The new  right PICC line terminates in the SVC without pneumothorax. Other support apparatus as above. 2. Increasing bilateral pulmonary opacities, right greater than left. While I suspect a component of pulmonary edema, underlying infection on the right should be considered. Electronically Signed   By: Dorise Bullion III M.D   On: 09/24/2018 13:59   Dg Chest Port 1 View  Result Date: 09/24/2018 CLINICAL DATA:  Intubation EXAM: PORTABLE CHEST 1 VIEW COMPARISON:  09/23/2018 FINDINGS: Endotracheal tube terminates 3.5 cm above the carina. Cardiomegaly with increased interstitial markings, possibly reflecting interstitial edema, although more focal opacity in the right mid lung raises concern for possible infection/pneumonia. Possible small left pleural effusion. No pneumothorax. Defibrillator pads overlying the left hemithorax. Left subclavian venous catheter versus left arm PICC terminates in the distal left brachiocephalic vein. Enteric tube courses into the stomach. IMPRESSION: Endotracheal tube terminates 3.5 cm above the carina. Left subclavian venous catheter versus left arm PICC terminates the distal left brachiocephalic vein. Focal right upper lobe opacity raises concern for pneumonia, less likely asymmetric interstitial edema. Possible small left pleural effusion. Electronically Signed   By: Julian Hy M.D.   On: 09/24/2018 09:34   Dg Chest Port 1 View  Result Date: 09/23/2018 CLINICAL DATA:  Central line placement. EXAM: PORTABLE CHEST 1 VIEW COMPARISON:  09/13/2018. FINDINGS: Apical lordotic view obtained. Prominence of superior mediastinum most likely related to prominent great vessels and apical lordotic projection. Left sided PICC line noted with tip over superior vena cava. Cardiomegaly with diffuse bilateral pulmonary interstitial prominence. Findings suggest mild CHF. Pneumonitis cannot be excluded. Tiny bilateral pleural effusions cannot be excluded. No pneumothorax. IMPRESSION: 1.  Left PICC  line noted over superior vena cava. 2. Cardiomegaly with mild bilateral interstitial prominence suggesting mild CHF. Pneumonitis cannot be excluded. Tiny bilateral pleural effusions cannot be excluded. Electronically Signed   By: Marcello Moores  Register   On:  09/23/2018 12:25     Medications:     Scheduled Medications: . atorvastatin  40 mg Oral q1800  . chlorhexidine gluconate (MEDLINE KIT)  15 mL Mouth Rinse BID  . Chlorhexidine Gluconate Cloth  6 each Topical Daily  . gabapentin  100 mg Oral BID  . insulin aspart  0-20 Units Subcutaneous Q4H  . insulin detemir  65 Units Subcutaneous Daily  . mouth rinse  15 mL Mouth Rinse 10 times per day  . sodium chloride flush  3 mL Intravenous Q12H  . sodium chloride flush  3 mL Intravenous Q12H    Infusions: .  prismasol BGK 4/2.5 500 mL/hr at 09/25/18 1020  .  prismasol BGK 4/2.5 300 mL/hr at 09/25/18 0630  . sodium chloride    . sodium chloride Stopped (09/25/18 0907)  . amiodarone 30 mg/hr (09/25/18 1009)  . famotidine (PEPCID) IV Stopped (09/24/18 1608)  . fentaNYL infusion INTRAVENOUS 125 mcg/hr (09/25/18 1000)  . heparin 10,000 units/ 20 mL infusion syringe 1,300 Units/hr (09/25/18 0935)  . midazolam Stopped (09/25/18 0734)  . milrinone Stopped (09/24/18 1550)  . norepinephrine (LEVOPHED) Adult infusion 22 mcg/min (09/25/18 1000)  . prismasol BGK 4/2.5 1,000 mL/hr at 09/25/18 1017    PRN Medications: sodium chloride, sodium chloride, acetaminophen, fentaNYL, heparin, heparin, heparin, midazolam, midazolam, ondansetron (ZOFRAN) IV, sodium chloride flush, sodium chloride flush, sodium chloride flush   Assessment/Plan:   1. PEA arrest on 5/30 - due to hypoxia/pulmonary edema. - On CVVHD Continue to get volume off   2. Acute on chronic hypoxic respiratory failure - due to OHS/OSA  - intubated on 5/30 in setting of arrest. Appreciate CCM's assistance.  - sleep study 4/15 without OSA (AHI 1.4) but had nocturnal desat - likely need to  repeat at some point   3.  Acute on chronic diastolic HF - She has markedly elevated filling pressures complicated by severe cardiorenal syndrome - Now improving with CVVHD. Appreciate Renal input. Will need to assess if she is a long-term HD candidate if kidneys don't recover.   - can stop milrinone. Will check co-ox in am.   4. Pulmonary HTN, severe  - this is mostly pulmonary venous HTN by cath [facility] 2) but also has a component of OHS/OSA (WHO group 3) - no role for selective pulmonary vasodilators - will improve significantly with volume removal - consider repeat RHC after volume removal   5. AKI on CKD 3 - baseline creatinine 1.9. Peaked at 3.2 -> 3.0 - likely cardiorenal - now on CVVHD. Plan as above. Continue NE to support volume removal and sedation .  6. Morbid obesity - desperately needs weight loss  6. Chronic AF - rate is slow. Hold apixaban for possible HD cath. Switch to heparin  7. VT/NSVT - start amio  - Keep K> 4.0 Mg 2.0 - stop milrinone  CRITICAL CARE Performed by: Glori Bickers  Total critical care time: 35 minutes  Critical care time was exclusive of separately billable procedures and treating other patients.  Critical care was necessary to treat or prevent imminent or life-threatening deterioration.  Critical care was time spent personally by me (independent of midlevel providers or residents) on the following activities: development of treatment plan with patient and/or surrogate as well as nursing, discussions with consultants, evaluation of patient's response to treatment, examination of patient, obtaining history from patient or surrogate, ordering and performing treatments and interventions, ordering and review of laboratory studies, ordering and review of radiographic studies, pulse oximetry and re-evaluation  of patient's condition.    Length of Stay: 3   Glori Bickers MD 09/25/2018, 10:54 AM  Advanced Heart Failure Team Pager  [phone] (M-F; [place])  Please contact [facility] for night-coverage after hours (4p -7a ) and weekends on [email or web address]

## 2018-09-25 NOTE — Consult Note (Signed)
NAME:  Kathleen [name], MRN:  [id number], DOB:  [birth year], LOS: 3 ADMISSION DATE:  09/22/2018, CONSULTATION DATE: 09/24/2018 REFERRING MD: Candee Furbish MD, CHIEF COMPLAINT: Cardiac arrest  Brief History   68-year-old with history of permanent atrial fibrillation, hypertension, super morbid obesity, diabetes, dyslipidemia, OHS, CKD, diastolic heart failure admitted on 5/28 with acute on chronic diastolic heart failure, failed outpatient diuretics management due to chronic kidney disease.  Right heart cath on 5/29 showed markedly elevated biventricular filling pressures with normal cardiac output.  She is on cardiology service, maintained on milrinone and Lasix The morning of 5/30 she went into PEA arrest, CPR performed for 8 minutes.  Intubated and transferred to ICU. PCCM consulted for management of vent and sedation  Past Medical History   has a past medical history of Atrial flutter ([place]), Bradycardia, Chronic diastolic (congestive) heart failure ([place]) (01/10/2015), CKD (chronic kidney disease), stage IV ([place]), Degenerative joint disease of hand, Diabetes mellitus, Dyslipidemia, Fecal occult blood test positive, GERD (gastroesophageal reflux disease), Headache, Hypertension, Inadequate material resources, Irritable bowel syndrome, Morbid obesity ([place]), Obesity hypoventilation syndrome ([place]), Post-menopausal bleeding, and Shortness of breath dyspnea.  Significant Hospital Events   5/28 Admit 5/29 RHC 5/30 PEA arrest, intubated  Consults:  PCCM  Procedures:  Lt PICC 5/29 >> OETT 5/20 >>  Significant Diagnostic Tests:  Right heart cath 5/29 RA = 24 RV = 94/26 PA = 95/36 (53) PCW = 30 (v = 50) Fick cardiac output/index = 6.0/2.7 PVR =3.9 WU FA sat = 91% PA sat = 58%, 58% SVC 60%  Chest x-ray 5/29- PICC line in place, cardiomegaly with bilateral interstitial prominence.  I have reviewed the images personally.  Micro Data:    Antimicrobials:    Interim history/subjective:   Started on CRRT.  No issues overnight.  Discussed abnormal/irregular heart rhythm this morning with nursing staff..  To have a short episode of bigeminy.  Currently now stable on mechanical ventilation life support.  Objective   Blood pressure (!) 83/49, pulse (!) 57, temperature (!) 97.5 F (36.4 C), temperature source Oral, resp. rate 20, weight 132.2 kg, SpO2 90 %. CVP:  [17 mmHg-18 mmHg] 17 mmHg  Vent Mode: PRVC FiO2 (%):  [40 %-100 %] 40 % Set Rate:  [20 bmp] 20 bmp Vt Set:  [360 mL] 360 mL PEEP:  [5 cmH20] 5 cmH20 Plateau Pressure:  [21 cmH20-26 cmH20] 26 cmH20   Intake/Output Summary (Last 24 hours) at 09/25/2018 1011 Last data filed at 09/25/2018 1000 Gross per 24 hour  Intake 2086.93 ml  Output 5285 ml  Net -3198.07 ml   Filed Weights   09/23/18 0419 09/24/18 0446 09/25/18 0500  Weight: 134.8 kg (!) 137.8 kg 132.2 kg    Examination: Gen:      Morbidly obese, intubated, sedated on mechanical ventilation HEENT: NCAT, sclera clear, not tracking, pupils are reactive to light Neck:     Endotracheal tube in place, large neck Lungs:    Bilateral ventilated breath sounds CV:         Regular rate rhythm, S1-S2, Abd:      Bowel sounds present, soft nontender Ext: Bilateral lower extremity edema wrapped in bandages, boots in place Neuro: Not following commands but does move all 4 extremities spontaneously  Resolved Hospital Problem list     Assessment & Plan:   Cardiac arrest, PEA Suspect hypoxemic driven. Cute hypoxemic respiratory failure requiring intubation mechanical ventilation secondary to pulmonary edema Full mechanical vent support. Wean as tolerated. Decrease sedation to  maintain RA SS 0 to -1. Telemetry monitoring.  Acute on chronic diastolic heart failure Severe pulmonary hypertension status post right heart catheterization Chronic A. fib On milrinone, Levophed and Lasix Volume removal with CVVHD at this time. Appreciate cardiology input. Heparin drip   Acute renal failure AKI on chronic kidney disease Likely cardiorenal syndrome Monitor urine output Follow labs daily. Continue CVVHD for aggressive volume removal. Appreciate nephrology recommendations and input.  Sedation management Continue fentanyl as well as PRN Versed. Avoid continuous Versed drip. This was discussed with nursing staff this morning.  Diabetes mellitus type II Morbid obesity SSI with CBGs and Levemir.  Best practice:  Diet: NPO Pain/Anxiety/Delirium protocol (if indicated): Fentanyl drip, PRN Versed VAP protocol (if indicated): Yes DVT prophylaxis: SCDs. Apixiban on hold GI prophylaxis: Pepcid Glucose control: SSI, Levemir Mobility: Bed Code Status: Full Family Communication: Per primary Disposition: ICU  Labs   CBC: Recent Labs  Lab 09/22/18 1420  09/23/18 0831 09/23/18 0836 09/24/18 1320 09/24/18 1551 09/25/18 0402  WBC 5.7  --   --   --   --   --   --   NEUTROABS 3.9  --   --   --   --   --   --   HGB 10.9*   < > 11.6* 10.9* 11.2* 11.2* 11.9*  HCT 38.0   < > 34.0* 32.0* 33.0* 33.0* 35.0*  MCV 85.2  --   --   --   --   --   --   PLT 133*  --   --   --   --   --   --    < > = values in this interval not displayed.    Basic Metabolic Panel: Recent Labs  Lab 09/22/18 1420 09/23/18 0403  09/24/18 0228 09/24/18 1320 09/24/18 1551 09/24/18 1600 09/25/18 0400 09/25/18 0402  NA 140 140   < > 138 138 139 137 139 140  K 3.9 4.5   < > 4.1 4.0 4.0 4.0 3.8 3.8  CL 101 101  --  100  --   --  98 101  --   CO2 29 30  --  27  --   --  24 26  --   GLUCOSE 153* 181*  --  155*  --   --  212* 124*  --   BUN 76* 75*  --  77*  --   --  75* 54*  --   CREATININE 2.84* 3.26*  --  3.08*  --   --  3.05* 2.41*  --   CALCIUM 9.0 8.7*  --  8.2*  --   --  8.4* 8.3*  --   MG 2.4  --   --   --   --   --   --  2.3  --   PHOS  --   --   --   --   --   --  4.1 3.1  --    < > = values in this interval not displayed.   GFR: Estimated Creatinine Clearance: 29.1  mL/min (A) (by C-G formula based on SCr of 2.41 mg/dL (H)). Recent Labs  Lab 09/22/18 1420  WBC 5.7    Liver Function Tests: Recent Labs  Lab 09/22/18 1420 09/24/18 1600 09/25/18 0400  AST 18  --   --   ALT 13  --   --   ALKPHOS 127*  --   --   BILITOT 1.2  --   --  PROT 7.2  --   --   ALBUMIN 3.2* 2.9* 2.9*   No results for input(s): LIPASE, AMYLASE in the last 168 hours. No results for input(s): AMMONIA in the last 168 hours.  ABG    Component Value Date/Time   PHART 7.344 (L) 09/25/2018 0402   PCO2ART 50.2 (H) 09/25/2018 0402   PO2ART 90.0 09/25/2018 0402   HCO3 27.6 09/25/2018 0402   TCO2 29 09/25/2018 0402   O2SAT 83.7 09/25/2018 0410     Coagulation Profile: No results for input(s): INR, PROTIME in the last 168 hours.  Cardiac Enzymes: No results for input(s): CKTOTAL, CKMB, CKMBINDEX, TROPONINI in the last 168 hours.  HbA1C: Hemoglobin A1C  Date/Time Value Ref Range Status  09/13/2018 11:06 AM 7.4 (A) 4.0 - 5.6 % Final  06/07/2018 10:44 AM 7.0 (A) 4.0 - 5.6 % Final   Hgb A1c MFr Bld  Date/Time Value Ref Range Status  01/11/2015 03:48 PM 6.9 (H) 4.8 - 5.6 % Final    Comment:    (NOTE)         Pre-diabetes: 5.7 - 6.4         Diabetes: >6.4         Glycemic control for adults with diabetes: <7.0   05/08/2010 01:39 PM 5.9 %     CBG: Recent Labs  Lab 09/24/18 1553 09/24/18 2001 09/24/18 2347 09/25/18 0434 09/25/18 0721  GLUCAP 211* 180* 119* 107* 112*   This patient is critically ill with multiple organ system failure; which, requires frequent high complexity decision making, assessment, support, evaluation, and titration of therapies. This was completed through the application of advanced monitoring technologies and extensive interpretation of multiple databases. During this encounter critical care time was devoted to patient care services described in this note for 35 minutes.   Garner Nash, DO [facility] 09/25/2018  10:11 AM  Personal pager: [phone] If unanswered, please page CCM On-call: [phone]

## 2018-09-25 NOTE — Progress Notes (Signed)
ANTICOAGULATION CONSULT NOTE - Initial Consult  Pharmacy Consult for Heparin Indication: atrial fibrillation  Allergies  Allergen Reactions  . Doxycycline     REACTION: Wheals and pruritus    Patient Measurements: Weight: 291 lb 7.2 oz (132.2 kg)  Vital Signs: Temp: 97.5 F (36.4 C) (05/31 1152) Temp Source: Oral (05/31 1152) BP: 101/43 (05/31 1152) Pulse Rate: 53 (05/31 1152)  Labs: Recent Labs    09/22/18 1420  09/24/18 0228 09/24/18 1320 09/24/18 1551 09/24/18 1600 09/25/18 0400 09/25/18 0402  HGB 10.9*   < >  --  11.2* 11.2*  --   --  11.9*  HCT 38.0   < >  --  33.0* 33.0*  --   --  35.0*  PLT 133*  --   --   --   --   --   --   --   APTT  --   --   --   --   --   --  75*  --   CREATININE 2.84*   < > 3.08*  --   --  3.05* 2.41*  --    < > = values in this interval not displayed.    Estimated Creatinine Clearance: 29.1 mL/min (A) (by C-G formula based on SCr of 2.41 mg/dL (H)).   Medical History: Past Medical History:  Diagnosis Date  . Atrial flutter ([place])    a. permanent.  . Bradycardia   . Chronic diastolic (congestive) heart failure ([place]) 01/10/2015  . CKD (chronic kidney disease), stage IV ([place])   . Degenerative joint disease of hand   . Diabetes mellitus   . Dyslipidemia   . Fecal occult blood test positive   . GERD (gastroesophageal reflux disease)   . Headache   . Hypertension   . Inadequate material resources   . Irritable bowel syndrome   . Morbid obesity ([place])   . Obesity hypoventilation syndrome ([place])   . Post-menopausal bleeding   . Shortness of breath dyspnea    multifactorial from obesity, deconditioning, obesity hypoventilation syndrome    Assessment: Pt is a 67yoF with a history of afib. She was on Eliquis PTA, received in hospital, last dose 5/29. Apixaban on hold for potential need for [facility], currently on CRRT. Pharmacy consulted to start heparin. Eliquis interferes with HL monitoring, will monitor aPTT until correlating.  aPTT  75 this morning with use of heparin for CRRT.  Currently on heparin 1300 units/hr with CRRT.    Goal of Therapy:  Heparin level 0.3-0.7 units/ml  APTT 66-102 Monitor platelets by anticoagulation protocol: Yes   Plan:  Remove heparin from CRRT circuits, utilize peripheral heparin for management Start heparin 1300 units/hr peripherally  Check aPTT/HL in 8 hours and daily while on heparin   Claiborne Billings, PharmD PGY2 Cardiology Pharmacy Resident Please check AMION for all Pharmacist numbers by unit 09/25/2018 12:11 PM

## 2018-09-25 NOTE — Consult Note (Signed)
Reason for Consult:AKI/CKD stage 3 Referring Physician: Haroldine Laws, MD  Kathleen [name] is an 68 y.o. female.  HPI: the patient has a PMH significant for permanent atrial fibrillation, HTN, morbid obesity, DM, OHS, CKD stage 3, and diastolic CHF who was admitted on 09/22/18 with acute on chronic diastolic heart failure.  She was started on IV lasix but suffered a PEA arrest on the morning of 09/24/18 and was intubated and transferred to the ICU.  We were consulted due to AKI/CKD and severe volume overload without adequate response to IV lasix.    Trend in Creatinine: Creatinine, Ser  Date/Time Value Ref Range Status  09/25/2018 04:00 AM 2.41 (H) 0.44 - 1.00 mg/dL Final  09/24/2018 04:00 PM 3.05 (H) 0.44 - 1.00 mg/dL Final  09/24/2018 02:28 AM 3.08 (H) 0.44 - 1.00 mg/dL Final  09/23/2018 04:03 AM 3.26 (H) 0.44 - 1.00 mg/dL Final  09/22/2018 02:20 PM 2.84 (H) 0.44 - 1.00 mg/dL Final  09/13/2018 12:03 PM 2.71 (H) 0.57 - 1.00 mg/dL Final  06/24/2018 11:29 AM 1.99 (H) 0.57 - 1.00 mg/dL Final  06/17/2018 02:01 PM 1.91 (H) 0.57 - 1.00 mg/dL Final  06/07/2018 11:54 AM 1.89 (H) 0.57 - 1.00 mg/dL Final  02/23/2018 11:00 AM 1.61 (H) 0.57 - 1.00 mg/dL Final  02/02/2018 11:58 AM 1.92 (H) 0.57 - 1.00 mg/dL Final  12/15/2017 01:29 PM 1.69 (H) 0.57 - 1.00 mg/dL Final  12/07/2017 10:14 AM 2.15 (H) 0.57 - 1.00 mg/dL Final  08/31/2017 09:15 AM 1.52 (H) 0.57 - 1.00 mg/dL Final  07/29/2017 01:19 PM 1.79 (H) 0.57 - 1.00 mg/dL Final  07/21/2017 11:39 AM 1.92 (H) 0.57 - 1.00 mg/dL Final  07/05/2017 11:27 AM 1.90 (H) 0.57 - 1.00 mg/dL Final  06/25/2017 09:13 AM 1.70 (H) 0.57 - 1.00 mg/dL Final  06/14/2017 01:40 PM 1.88 (H) 0.57 - 1.00 mg/dL Final  05/04/2017 09:17 AM 1.56 (H) 0.57 - 1.00 mg/dL Final  12/22/2016 01:19 PM 1.63 (H) 0.57 - 1.00 mg/dL Final  10/22/2016 12:24 PM 1.70 (H) 0.57 - 1.00 mg/dL Final  10/14/2016 10:50 AM 1.64 (H) 0.57 - 1.00 mg/dL Final  07/08/2016 02:01 PM 1.55 (H) 0.57 - 1.00 mg/dL Final   06/30/2016 11:54 AM 1.48 (H) 0.57 - 1.00 mg/dL Final  03/30/2016 09:03 AM 1.45 (H) 0.57 - 1.00 mg/dL Final  03/24/2016 11:31 AM 2.08 (H) 0.57 - 1.00 mg/dL Final  02/21/2016 11:44 AM 1.61 (H) 0.57 - 1.00 mg/dL Final  02/14/2016 11:15 AM 1.67 (H) 0.44 - 1.00 mg/dL Final  01/24/2016 10:37 AM 1.76 (H) 0.44 - 1.00 mg/dL Final  01/16/2016 01:56 PM 1.64 (H) 0.44 - 1.00 mg/dL Final  01/10/2016 12:32 PM 1.72 (H) 0.57 - 1.00 mg/dL Final  12/27/2015 09:55 AM 1.96 (H) 0.44 - 1.00 mg/dL Final  12/13/2015 09:38 AM 1.88 (H) 0.44 - 1.00 mg/dL Final  12/10/2015 12:09 PM 1.71 (H) 0.57 - 1.00 mg/dL Final  08/27/2015 10:37 AM 1.78 (H) 0.57 - 1.00 mg/dL Final  07/02/2015 12:15 PM 1.56 (H) 0.57 - 1.00 mg/dL Final  03/19/2015 11:16 AM 1.62 (H) 0.44 - 1.00 mg/dL Final  02/27/2015 02:05 PM 1.78 (H) 0.44 - 1.00 mg/dL Final  02/05/2015 11:50 AM 1.64 (H) 0.57 - 1.00 mg/dL Final  01/22/2015 12:17 PM 1.43 (H) 0.57 - 1.00 mg/dL Final  01/18/2015 05:26 AM 1.27 (H) 0.44 - 1.00 mg/dL Final  01/17/2015 04:40 AM 1.30 (H) 0.44 - 1.00 mg/dL Final  01/16/2015 09:33 AM 1.34 (H) 0.44 - 1.00 mg/dL Final  01/16/2015 08:07 AM 1.39 (H)  0.44 - 1.00 mg/dL Final  01/15/2015 04:56 AM 1.44 (H) 0.44 - 1.00 mg/dL Final  01/14/2015 06:24 AM 1.48 (H) 0.44 - 1.00 mg/dL Final  01/13/2015 03:05 AM 1.72 (H) 0.44 - 1.00 mg/dL Final  01/12/2015 10:45 AM 1.66 (H) 0.44 - 1.00 mg/dL Final  01/11/2015 09:10 AM 1.73 (H) 0.44 - 1.00 mg/dL Final  01/10/2015 05:44 PM 1.90 (H) 0.44 - 1.00 mg/dL Final  05/25/2013 03:32 AM 1.16 (H) 0.50 - 1.10 mg/dL Final    PMH:   Past Medical History:  Diagnosis Date  . Atrial flutter ([place])    a. permanent.  . Bradycardia   . Chronic diastolic (congestive) heart failure ([place]) 01/10/2015  . CKD (chronic kidney disease), stage IV ([place])   . Degenerative joint disease of hand   . Diabetes mellitus   . Dyslipidemia   . Fecal occult blood test positive   . GERD (gastroesophageal reflux disease)   . Headache   .  Hypertension   . Inadequate material resources   . Irritable bowel syndrome   . Morbid obesity ([place])   . Obesity hypoventilation syndrome ([place])   . Post-menopausal bleeding   . Shortness of breath dyspnea    multifactorial from obesity, deconditioning, obesity hypoventilation syndrome    PSH:   Past Surgical History:  Procedure Laterality Date  . CHOLECYSTECTOMY    . EYE SURGERY     left lens implant s/p cataracts  . OTHER SURGICAL HISTORY     right shoulder tendon repair 05/2011  . RIGHT HEART CATH N/A 09/23/2018   Procedure: RIGHT HEART CATH;  Surgeon: Jolaine Artist, MD;  Location: [facility];  Service: Cardiovascular;  Laterality: N/A;    Allergies:  Allergies  Allergen Reactions  . Doxycycline     REACTION: Wheals and pruritus    Medications:   Prior to Admission medications   Medication Sig Start Date End Date Taking? Authorizing Provider  apixaban (ELIQUIS) 5 MG TABS tablet Take 1 tablet (5 mg total) by mouth 2 (two) times daily. 06/07/18  Yes Aldine Contes, MD  atorvastatin (LIPITOR) 40 MG tablet Take 1 tablet (40 mg total) by mouth daily. 02/14/18  Yes Aldine Contes, MD  gabapentin (NEURONTIN) 300 MG capsule TAKE 1 CAPSULE EVERY MORNING, TAKE 1 CAPSULE IN THE AFTERNOON, AND TAKE 2 CAPSULES EVERY NIGHT Patient taking differently: Take 300 mg by mouth. TAKE 300 mg CAPSULE EVERY MORNING, TAKE 300 mg CAPSULE IN THE AFTERNOON, AND TAKE 600 mg  EVERY NIGHT 09/06/18  Yes Narendra, Nischal, MD  insulin detemir (LEVEMIR) 100 UNIT/ML injection Inject 65 units total into the skin once daily. 02/23/18  Yes [place], Carley D, DO  liraglutide (VICTOZA) 18 MG/3ML SOPN Inject 0.3 mLs (1.8 mg total) into the skin daily. 02/09/18  Yes Seawell, Jaimie A, DO  metolazone (ZAROXOLYN) 5 MG tablet Take 5 mg by mouth daily.   Yes [provider]  omeprazole (PRILOSEC) 20 MG capsule Take 20 mg by mouth daily.   Yes [provider]  POTASSIUM CHLORIDE CRYS  ER PO Take 20 mEq by mouth daily. For 10 days   Yes [provider]  torsemide (DEMADEX) 20 MG tablet Take 20 mg by mouth 2 (two) times daily.   Yes [provider]  ACCU-CHEK AVIVA PLUS test strip TEST BLOOD SUGAR THREE TIMES DAILY Patient taking differently: 1 each by Other route 3 (three) times daily.  09/20/18   Aldine Contes, MD  Accu-Chek FastClix Lancets MISC TEST BLOOD SUGAR THREE TIMES  DAILY 09/06/18   Aldine Contes, MD  Accu-Chek Softclix Lancets lancets TEST BLOOD SUGAR THREE TIMES DAILY Patient taking differently: 1 each 3 (three) times daily.  09/20/18   Aldine Contes, MD  Blood Glucose Monitoring Suppl (ACCU-CHEK AVIVA PLUS) w/Device KIT TEST BLOOD SUGAR THREE TIMES DAILY 09/05/18   Aldine Contes, MD  Insulin Pen Needle (B-D UF III MINI PEN NEEDLES) 31G X 5 MM MISC USE ONE TIME DAILY TO INJECT VICTOZA. DX:E11.9 09/06/18   Aldine Contes, MD  Insulin Syringe-Needle U-100 (BD VEO INSULIN SYRINGE U/F) 31G X 15/64" 1 ML MISC USE TO INJECT INSULIN ONE TIME A DAY 06/03/18   Aldine Contes, MD  metFORMIN (GLUCOPHAGE) 500 MG tablet Take 1 tablet (500 mg total) by mouth 2 (two) times daily with a meal. 09/13/18   Aldine Contes, MD  Multiple Vitamins-Minerals (HEALTHY EYES PO) Take 1 tablet by mouth daily. Reported on 05/17/2015    [provider]  NON FORMULARY Place 2 L into the nose daily. Wears with exertion and qhs    [provider]    Inpatient medications: . atorvastatin  40 mg Oral q1800  . chlorhexidine gluconate (MEDLINE KIT)  15 mL Mouth Rinse BID  . Chlorhexidine Gluconate Cloth  6 each Topical Daily  . gabapentin  100 mg Oral BID  . insulin aspart  0-20 Units Subcutaneous Q4H  . insulin detemir  65 Units Subcutaneous Daily  . mouth rinse  15 mL Mouth Rinse 10 times per day  . sodium chloride flush  3 mL Intravenous Q12H  . sodium chloride flush  3 mL Intravenous Q12H    Discontinued Meds:   Medications Discontinued  During This Encounter  Medication Reason  . liraglutide (VICTOZA) SOPN 1.8 mg Not available  . Heparin (Porcine) in NaCl 1000-0.9 UT/500ML-% SOLN Patient Discharge  . lidocaine (PF) (XYLOCAINE) 1 % injection Patient Discharge  . sodium chloride flush (NS) 0.9 % injection 3 mL Patient Transfer  . sodium chloride flush (NS) 0.9 % injection 3 mL Patient Transfer  . 0.9 %  sodium chloride infusion Patient Transfer  . 0.9 %  sodium chloride infusion Patient Transfer  . furosemide (LASIX) injection 80 mg   . acetaminophen (TYLENOL) tablet 574 mg P&T Policy: Duplicate PRN Therapy  . ondansetron (ZOFRAN) injection 4 mg P&T Policy: Duplicate PRN Therapy  . furosemide (LASIX) 120 mg in dextrose 5 % 50 mL IVPB   . fentaNYL (SUBLIMAZE) injection 25 mcg Duplicate  . fentaNYL 2559mg in NS 254m(1051mml) infusion-PREMIX Duplicate  . fentaNYL (SUBLIMAZE) bolus via infusion 25 mcg Duplicate  . midazolam (VERSED) injection 1 mg Duplicate  . midazolam (VERSED) injection 1 mg Duplicate  . magnesium sulfate 4 GM/100ML IVPB Returned to ADS  . apixaban (ELIQUIS) tablet 5 mg   . furosemide (LASIX) 120 mg in dextrose 5 % 50 mL IVPB   . norepinephrine (LEVOPHED) 4mg36m 250mL50mmix infusion   . furosemide (LASIX) 250 mg in dextrose 5 % 250 mL (1 mg/mL) infusion   . insulin aspart (novoLOG) injection 0-9 Units Duplicate  . chlorhexidine gluconate (MEDLINE KIT) (PERIDEX) 0.12 % solution 15 mL   . MEDLINE mouth rinse   . metolazone (ZAROXOLYN) tablet 5 mg   . gabapentin (NEURONTIN) capsule 300 mg   . gabapentin (NEURONTIN) capsule 600 mg     Social History:  reports that she has never smoked. She has never used smokeless tobacco. She reports that she does not drink alcohol or use drugs.  Family History:  Family History  Problem Relation Age of Onset  . Diabetes Mother   . Obesity Mother   . Stroke Father   . Diabetes Sister   . Colon cancer Neg Hx     Review of systems not obtained due to patient  factors. Weight change: -5.6 kg  Intake/Output Summary (Last 24 hours) at 09/25/2018 1042 Last data filed at 09/25/2018 1000 Gross per 24 hour  Intake 2026.93 ml  Output 5316 ml  Net -3289.07 ml   BP (!) 93/42   Pulse (!) 58   Temp (!) 97.5 F (36.4 C) (Oral)   Resp (!) 21   Wt 132.2 kg   SpO2 (!) 88%   BMI 56.92 kg/m  Vitals:   09/25/18 0945 09/25/18 1000 09/25/18 1015 09/25/18 1030  BP:  (!) 93/42    Pulse: (!) 57 (!) 57 (!) 57 (!) 58  Resp: (!) _0 (!) 21  Temp:      TempSrc:      SpO2: 91% 90% 92% (!) 88%  Weight:         General appearance: morbidly obese and intubated and sedated Head: Normocephalic, without obvious abnormality, atraumatic Resp: clear to auscultation bilaterally Cardio: no rub GI: soft, non-tender; bowel sounds normal; no masses,  no organomegaly Extremities: edema 2+ bilateral lower extremities with wrappings in place  Labs: Basic Metabolic Panel: Recent Labs  Lab 09/22/18 1420 09/23/18 0403  09/23/18 0836 09/24/18 0228 09/24/18 1320 09/24/18 1551 09/24/18 1600 09/25/18 0400 09/25/18 0402  NA 140 140   < > 144 138 138 139 137 139 140  K 3.9 4.5   < > 3.9 4.1 4.0 4.0 4.0 3.8 3.8  CL 101 101  --   --  100  --   --  98 101  --   CO2 29 30  --   --  27  --   --  24 26  --   GLUCOSE 153* 181*  --   --  155*  --   --  212* 124*  --   BUN 76* 75*  --   --  77*  --   --  75* 54*  --   CREATININE 2.84* 3.26*  --   --  3.08*  --   --  3.05* 2.41*  --   ALBUMIN 3.2*  --   --   --   --   --   --  2.9* 2.9*  --   CALCIUM 9.0 8.7*  --   --  8.2*  --   --  8.4* 8.3*  --   PHOS  --   --   --   --   --   --   --  4.1 3.1  --    < > = values in this interval not displayed.   Liver Function Tests: Recent Labs  Lab 09/22/18 1420 09/24/18 1600 09/25/18 0400  AST 18  --   --   ALT 13  --   --   ALKPHOS 127*  --   --   BILITOT 1.2  --   --   PROT 7.2  --   --   ALBUMIN 3.2* 2.9* 2.9*   No results for input(s): LIPASE, AMYLASE in the last  168 hours. No results for input(s): AMMONIA in the last 168 hours. CBC: Recent Labs  Lab 09/22/18 1420  09/23/18 0836 09/24/18 1320 09/24/18 1551 09/25/18 0402  WBC 5.7  --   --   --   --   --  NEUTROABS 3.9  --   --   --   --   --   HGB 10.9*   < > 10.9* 11.2* 11.2* 11.9*  HCT 38.0   < > 32.0* 33.0* 33.0* 35.0*  MCV 85.2  --   --   --   --   --   PLT 133*  --   --   --   --   --    < > = values in this interval not displayed.   PT/INR: _0 (inr:5) Cardiac Enzymes: )No results for input(s): CKTOTAL, CKMB, CKMBINDEX, TROPONINI in the last 168 hours. CBG: Recent Labs  Lab 09/24/18 1553 09/24/18 2001 09/24/18 2347 09/25/18 0434 09/25/18 0721  GLUCAP 211* 180* 119* 107* 112*    Iron Studies: No results for input(s): IRON, TIBC, TRANSFERRIN, FERRITIN in the last 168 hours.  Xrays/Other Studies: Dg Chest Port 1 View  Result Date: 09/24/2018 CLINICAL DATA:  Central line placement.  History of CHF. EXAM: PORTABLE CHEST 1 VIEW COMPARISON:  Sep 24, 2018 FINDINGS: A left PICC line terminates near the confluence of the brachiocephalic veins. The ETT is in good position. A new right central line terminates in the SVC. No pneumothorax. Increasing bilateral pulmonary opacities, more focal in the right. NG tube terminates below today's film. No other acute abnormalities. IMPRESSION: 1. The new right PICC line terminates in the SVC without pneumothorax. Other support apparatus as above. 2. Increasing bilateral pulmonary opacities, right greater than left. While I suspect a component of pulmonary edema, underlying infection on the right should be considered. Electronically Signed   By: Dorise Bullion III M.D   On: 09/24/2018 13:59   Dg Chest Port 1 View  Result Date: 09/24/2018 CLINICAL DATA:  Intubation EXAM: PORTABLE CHEST 1 VIEW COMPARISON:  09/23/2018 FINDINGS: Endotracheal tube terminates 3.5 cm above the carina. Cardiomegaly with increased interstitial markings, possibly  reflecting interstitial edema, although more focal opacity in the right mid lung raises concern for possible infection/pneumonia. Possible small left pleural effusion. No pneumothorax. Defibrillator pads overlying the left hemithorax. Left subclavian venous catheter versus left arm PICC terminates in the distal left brachiocephalic vein. Enteric tube courses into the stomach. IMPRESSION: Endotracheal tube terminates 3.5 cm above the carina. Left subclavian venous catheter versus left arm PICC terminates the distal left brachiocephalic vein. Focal right upper lobe opacity raises concern for pneumonia, less likely asymmetric interstitial edema. Possible small left pleural effusion. Electronically Signed   By: Julian Hy M.D.   On: 09/24/2018 09:34   Dg Chest Port 1 View  Result Date: 09/23/2018 CLINICAL DATA:  Central line placement. EXAM: PORTABLE CHEST 1 VIEW COMPARISON:  09/13/2018. FINDINGS: Apical lordotic view obtained. Prominence of superior mediastinum most likely related to prominent great vessels and apical lordotic projection. Left sided PICC line noted with tip over superior vena cava. Cardiomegaly with diffuse bilateral pulmonary interstitial prominence. Findings suggest mild CHF. Pneumonitis cannot be excluded. Tiny bilateral pleural effusions cannot be excluded. No pneumothorax. IMPRESSION: 1.  Left PICC line noted over superior vena cava. 2. Cardiomegaly with mild bilateral interstitial prominence suggesting mild CHF. Pneumonitis cannot be excluded. Tiny bilateral pleural effusions cannot be excluded. Electronically Signed   By: Marcello Moores  Register   On: 09/23/2018 12:25     Assessment/Plan: 1.  AKI/CKD stage 3 in setting of PEA arrest and decompensated CHF.  Started on CVVHDF and removed 4 liters overnight.  Remains oliguric.  Electrolytes stable.  Using 4K/2.5Ca bath for all fluids and using heparin infusion 2.  PEA arrest- had runs of VT yesterday.  Cardiology to manage 3. Acute  hypoxic respiratory failure- currently on ventilator being managed by PCCM 4. Acute on chronic diastolic and systolic CHF- UF with CVVHD 5. DM- per primary svc 6. Morbid obesity with h/o OHS 7. Severe pulmonary HTN- due to volume overload and OHS/OSA 8. Atrial fibrillation- on apixaban   Donetta Potts 09/25/2018, 10:42 AM

## 2018-09-25 NOTE — Progress Notes (Signed)
[facility] for Heparin Indication: atrial fibrillation  Allergies  Allergen Reactions  . Doxycycline     REACTION: Wheals and pruritus    Patient Measurements: Weight: 291 lb 7.2 oz (132.2 kg)  Vital Signs: Temp: 99 F (37.2 C) (05/31 2300) Temp Source: Esophageal (05/31 2200) BP: 110/36 (05/31 2300) Pulse Rate: 60 (05/31 2300)  Labs: Recent Labs    09/24/18 1320 09/24/18 1551 09/24/18 1600 09/25/18 0400 09/25/18 0402 09/25/18 1614 09/25/18 2130  HGB 11.2* 11.2*  --   --  11.9*  --   --   HCT 33.0* 33.0*  --   --  35.0*  --   --   APTT  --   --   --  75*  --   --  91*  CREATININE  --   --  3.05* 2.41*  --  1.97*  --     Estimated Creatinine Clearance: 35.6 mL/min (A) (by C-G formula based on SCr of 1.97 mg/dL (H)).   Medical History: Past Medical History:  Diagnosis Date  . Atrial flutter ([place])    a. permanent.  . Bradycardia   . Chronic diastolic (congestive) heart failure ([place]) 01/10/2015  . CKD (chronic kidney disease), stage IV ([place])   . Degenerative joint disease of hand   . Diabetes mellitus   . Dyslipidemia   . Fecal occult blood test positive   . GERD (gastroesophageal reflux disease)   . Headache   . Hypertension   . Inadequate material resources   . Irritable bowel syndrome   . Morbid obesity ([place])   . Obesity hypoventilation syndrome ([place])   . Post-menopausal bleeding   . Shortness of breath dyspnea    multifactorial from obesity, deconditioning, obesity hypoventilation syndrome    Assessment: Pt is a 67yoF with a history of afib. She was on Eliquis PTA, received in hospital, last dose 5/29. Apixaban on hold for potential need for [facility], currently on CRRT. Pharmacy consulted to start heparin. Eliquis interferes with HL monitoring, will monitor aPTT until correlating.  aPTT 91 sec this evening   Goal of Therapy:  Heparin level 0.3-0.7 units/ml  APTT 66-102 Monitor platelets by anticoagulation  protocol: Yes   Plan:  Continue heparin 1300 units/hr peripherally  Check aPTT/HL  daily while on heparin   Thanks for allowing pharmacy to be a part of this patient's care.  Excell Seltzer, PharmD Clinical Pharmacist

## 2018-09-26 ENCOUNTER — Inpatient Hospital Stay (HOSPITAL_COMMUNITY): Payer: Medicare HMO

## 2018-09-26 DIAGNOSIS — Z9911 Dependence on respirator [ventilator] status: Secondary | ICD-10-CM

## 2018-09-26 DIAGNOSIS — J810 Acute pulmonary edema: Secondary | ICD-10-CM

## 2018-09-26 LAB — COOXEMETRY PANEL
Carboxyhemoglobin: 2.2 % — ABNORMAL HIGH (ref 0.5–1.5)
Methemoglobin: 1.1 % (ref 0.0–1.5)
O2 Saturation: 84.3 %
Total hemoglobin: 10.6 g/dL — ABNORMAL LOW (ref 12.0–16.0)

## 2018-09-26 LAB — HEPARIN LEVEL (UNFRACTIONATED): Heparin Unfractionated: >2.2 IU/mL — ABNORMAL HIGH (ref 0.30–0.70)

## 2018-09-26 LAB — RENAL FUNCTION PANEL
Albumin: 2.7 g/dL — ABNORMAL LOW (ref 3.5–5.0)
Albumin: 2.8 g/dL — ABNORMAL LOW (ref 3.5–5.0)
Anion gap: 9 (ref 5–15)
Anion gap: 8 (ref 5–15)
BUN: 32 mg/dL — ABNORMAL HIGH (ref 8–23)
BUN: 31 mg/dL — ABNORMAL HIGH (ref 8–23)
CO2: 25 mmol/L (ref 22–32)
CO2: 26 mmol/L (ref 22–32)
Calcium: 8.2 mg/dL — ABNORMAL LOW (ref 8.9–10.3)
Calcium: 8.4 mg/dL — ABNORMAL LOW (ref 8.9–10.3)
Chloride: 104 mmol/L (ref 98–111)
Chloride: 102 mmol/L (ref 98–111)
Creatinine, Ser: 1.96 mg/dL — ABNORMAL HIGH (ref 0.44–1.00)
Creatinine, Ser: 1.83 mg/dL — ABNORMAL HIGH (ref 0.44–1.00)
GFR calc Af Amer: 30 mL/min — ABNORMAL LOW (ref >60)
GFR calc Af Amer: 33 mL/min — ABNORMAL LOW (ref >60)
GFR calc non Af Amer: 26 mL/min — ABNORMAL LOW (ref >60)
GFR calc non Af Amer: 28 mL/min — ABNORMAL LOW (ref >60)
Glucose, Bld: 116 mg/dL — ABNORMAL HIGH (ref 70–99)
Glucose, Bld: 169 mg/dL — ABNORMAL HIGH (ref 70–99)
Phosphorus: 2.7 mg/dL (ref 2.5–4.6)
Phosphorus: 2.7 mg/dL (ref 2.5–4.6)
Potassium: 4.9 mmol/L (ref 3.5–5.1)
Potassium: 4.7 mmol/L (ref 3.5–5.1)
Sodium: 138 mmol/L (ref 135–145)
Sodium: 136 mmol/L (ref 135–145)

## 2018-09-26 LAB — GLUCOSE, CAPILLARY
Glucose-Capillary: 102 mg/dL — ABNORMAL HIGH (ref 70–99)
Glucose-Capillary: 105 mg/dL — ABNORMAL HIGH (ref 70–99)
Glucose-Capillary: 107 mg/dL — ABNORMAL HIGH (ref 70–99)
Glucose-Capillary: 112 mg/dL — ABNORMAL HIGH (ref 70–99)
Glucose-Capillary: 131 mg/dL — ABNORMAL HIGH (ref 70–99)
Glucose-Capillary: 142 mg/dL — ABNORMAL HIGH (ref 70–99)
Glucose-Capillary: 144 mg/dL — ABNORMAL HIGH (ref 70–99)
Glucose-Capillary: 156 mg/dL — ABNORMAL HIGH (ref 70–99)

## 2018-09-26 LAB — MAGNESIUM
Magnesium: 2.3 mg/dL (ref 1.7–2.4)
Magnesium: 2.5 mg/dL — ABNORMAL HIGH (ref 1.7–2.4)

## 2018-09-26 LAB — APTT: aPTT: 64 seconds — ABNORMAL HIGH (ref 24–36)

## 2018-09-26 MED ORDER — GABAPENTIN 250 MG/5ML PO SOLN: 100.0000 mg | Freq: Two times a day (BID) | ORAL | Status: DC

## 2018-09-26 MED ORDER — GABAPENTIN 300 MG/6ML PO SOLN
100.0000 mg | Freq: Two times a day (BID) | ORAL | Status: DC
  Administered 2018-09-26 – 2018-10-05 (×15): 100 mg
  Filled 2018-09-26 (×24): qty 2

## 2018-09-26 MED ORDER — PRO-STAT SUGAR FREE PO LIQD
30.0000 mL | Freq: Every day | ORAL | Status: DC
  Administered 2018-09-27 – 2018-09-28 (×2): 30 mL
  Filled 2018-09-26 (×2): qty 30

## 2018-09-26 MED ORDER — VITAL HIGH PROTEIN PO LIQD
1000.0000 mL | ORAL | Status: DC
  Administered 2018-09-26 – 2018-09-27 (×2): 1000 mL

## 2018-09-26 MED ORDER — GABAPENTIN 100 MG PO CAPS: 100.0000 mg | ORAL_CAPSULE | Freq: Two times a day (BID) | ORAL | Status: DC

## 2018-09-26 MED ORDER — VITAL HIGH PROTEIN PO LIQD: 1000.0000 mL | ORAL | Status: DC

## 2018-09-26 MED ORDER — PRO-STAT SUGAR FREE PO LIQD
30.0000 mL | Freq: Two times a day (BID) | ORAL | Status: DC
  Administered 2018-09-26: 30 mL
  Filled 2018-09-26: qty 30

## 2018-09-26 NOTE — Progress Notes (Addendum)
NAME:  Kathleen [name], MRN:  [id number], DOB:  10-Dec-1950, LOS: 4 ADMISSION DATE:  09/22/2018, CONSULTATION DATE: 09/24/2018 REFERRING MD: Candee Furbish MD, CHIEF COMPLAINT: Cardiac arrest  Brief History   68 yo female who failed outpatient diuretics management due to chronic kidney disease.  Right heart cath on 5/29 showed markedly elevated biventricular filling pressures with normal cardiac output.  She is on cardiology service, maintained on milrinone and Lasix.  On the morning of 5/30 she went into PEA arrest, CPR performed for 8 minutes.  Intubated and transferred to ICU.  Started on CRRT 5/31.  Past Medical History   has a past medical history of Atrial flutter ([place]), Bradycardia, Chronic diastolic (congestive) heart failure ([place]) (01/10/2015), CKD (chronic kidney disease), stage IV ([place]), Degenerative joint disease of hand, Diabetes mellitus, Dyslipidemia, Fecal occult blood test positive, GERD (gastroesophageal reflux disease), Headache, Hypertension, Inadequate material resources, Irritable bowel syndrome, Morbid obesity ([place]), Obesity hypoventilation syndrome ([place]), Post-menopausal bleeding, and Shortness of breath dyspnea.  Significant Hospital Events   5/28 Admit 5/29 RHC 5/30 PEA arrest, intubated/ CRRT 5/31  Milrinone stopped due to ectopy; brady episode- amio stopped  Consults:  Cardiology PCCM  Nephrology   Procedures:  Lt PICC 5/29 >> OETT 5/30 >> R IJ HD cath 5/30 >> R radial Aline >>  Significant Diagnostic Tests:  TTE 5/28 >> 1. The left ventricle has normal systolic function with an ejection fraction of 60-65%. The cavity size was normal. Left ventricular diastolic Doppler parameters are indeterminate. No evidence of left ventricular regional wall motion abnormalities.  2. The right ventricle has normal systolic function. The cavity was mildly enlarged. There is no increase in right ventricular wall thickness.  3. Right atrial size was mildly dilated.  4. There is  mild mitral annular calcification present.  5. The aortic valve is tricuspid. Mild thickening of the aortic valve. Mild calcification of the aortic valve. Aortic valve regurgitation is trivial by color flow Doppler.  6. The aortic root is normal in size and structure.  Right heart cath 5/29 RA = 24 RV = 94/26 PA = 95/36 (53) PCW = 30 (v = 50) Fick cardiac output/index = 6.0/2.7 PVR =3.9 WU FA sat = 91% PA sat = 58%, 58% SVC 60%  Micro Data:  5/28 SARS coronavirus 2 cepheid >> neg 5/28 MRSA PCR >> neg  Antimicrobials:   Interim history/subjective:  No further significant ectopy  Ongoing CRRT with UF 200/hr, UOP ~10 ml/hr  Neg 4.5L / wt 132.2 -> 127.6 CVP 18, coox 84.3 FiO2 50%/ PEEP 10 Remains on levophed 20 mcg/min Currently off fentanyl gtt tmax  99.5  Objective   Blood pressure (!) 129/53, pulse (!) 56, temperature 98.1 F (36.7 C), resp. rate 16, weight 127.6 kg, SpO2 96 %. CVP:  [16 mmHg-23 mmHg] 18 mmHg  Vent Mode: PRVC FiO2 (%):  [40 %-60 %] 50 % Set Rate:  [20 bmp] 20 bmp Vt Set:  [360 mL] 360 mL PEEP:  [5 cmH20-10 cmH20] 10 cmH20 Plateau Pressure:  [21 cmH20-28 cmH20] 21 cmH20   Intake/Output Summary (Last 24 hours) at 09/26/2018 0843 Last data filed at 09/26/2018 0800 Gross per 24 hour  Intake 1165.82 ml  Output 5737 ml  Net -4571.18 ml   Filed Weights   09/24/18 0446 09/25/18 0500 09/26/18 0429  Weight: (!) 137.8 kg 132.2 kg 127.6 kg    Examination: General:  Critically ill obese female on MV in NAD HEENT: MM pink/moist, pupils 3/reactive, anicteric, OETT/ OGT  Neuro: Easily awakens, tracks, follows simple commands, MAE CV: RR, no murmur PULM: even/non-labored, lungs bilaterally diminished, breathing over vent GI: obese, soft, non-tender, bs active, foley  Extremities: warm/dry, BLE wrapped in bandages, boots on  Skin: no rashes   Resolved Hospital Problem list    Assessment & Plan:   Cardiac arrest, PEA- Suspect hypoxemic driven. Acute  hypoxemic respiratory failure requiring intubation mechanical ventilation secondary to pulmonary edema - CXR 6/1 stable ETT/ lines, ongoing pulm edema Full mechanical vent support, PRVC 8 cc/kg, rate 20 Wean FiO2/ PEEP for sat goal > 94 Needs further volume removal/ less PEEP/ FiO2 requirements prior to SBT Trend CXR/ ABG PAD protocol prn fentanyl/ versed, RASS goal 0/-1 Diuresis per CRRT  Acute on chronic diastolic heart failure Severe pulmonary hypertension status post right heart catheterization Chronic A. fib Cards primary  Tele monitoring Ongoing volume removal per CRRT  Levophed for goal MAP > 65 Trending CVPs/ Coox Strict I/O's Heparin gtt   Acute renal failure AKI on chronic kidney disease Likely cardiorenal syndrome Nephrology following, ongoing CRRT   Diabetes mellitus type II Morbid obesity SSI with CBGs and Levemir.  Best practice:  Diet: NPO, start TF  Pain/Anxiety/Delirium protocol (if indicated): PRN Versed/ fentanyl  VAP protocol (if indicated): Yes DVT prophylaxis: heparin gtt GI prophylaxis: Pepcid Glucose control: SSI, Levemir Mobility: Bed Code Status: Full Family Communication: Per primary Disposition: ICU  Labs   CBC: Recent Labs  Lab 09/22/18 1420  09/23/18 0831 09/23/18 0836 09/24/18 1320 09/24/18 1551 09/25/18 0402  WBC 5.7  --   --   --   --   --   --   NEUTROABS 3.9  --   --   --   --   --   --   HGB 10.9*   < > 11.6* 10.9* 11.2* 11.2* 11.9*  HCT 38.0   < > 34.0* 32.0* 33.0* 33.0* 35.0*  MCV 85.2  --   --   --   --   --   --   PLT 133*  --   --   --   --   --   --    < > = values in this interval not displayed.    Basic Metabolic Panel: Recent Labs  Lab 09/22/18 1420  09/24/18 0228  09/24/18 1600 09/25/18 0400 09/25/18 0402 09/25/18 1614 09/26/18 0357  NA 140   < > 138   < > 137 139 140 139 138  K 3.9   < > 4.1   < > 4.0 3.8 3.8 4.5 4.9  CL 101   < > 100  --  98 101  --  100 104  CO2 29   < > 27  --  24 26  --  25 25   GLUCOSE 153*   < > 155*  --  212* 124*  --  137* 116*  BUN 76*   < > 77*  --  75* 54*  --  39* 32*  CREATININE 2.84*   < > 3.08*  --  3.05* 2.41*  --  1.97* 1.96*  CALCIUM 9.0   < > 8.2*  --  8.4* 8.3*  --  8.4* 8.2*  MG 2.4  --   --   --   --  2.3  --   --  2.3  PHOS  --   --   --   --  4.1 3.1  --  3.1 2.7   < > = values  in this interval not displayed.   GFR: Estimated Creatinine Clearance: 34.9 mL/min (A) (by C-G formula based on SCr of 1.96 mg/dL (H)). Recent Labs  Lab 09/22/18 1420  WBC 5.7    Liver Function Tests: Recent Labs  Lab 09/22/18 1420 09/24/18 1600 09/25/18 0400 09/25/18 1614 09/26/18 0357  AST 18  --   --   --   --   ALT 13  --   --   --   --   ALKPHOS 127*  --   --   --   --   BILITOT 1.2  --   --   --   --   PROT 7.2  --   --   --   --   ALBUMIN 3.2* 2.9* 2.9* 2.8* 2.7*   No results for input(s): LIPASE, AMYLASE in the last 168 hours. No results for input(s): AMMONIA in the last 168 hours.  ABG    Component Value Date/Time   PHART 7.344 (L) 09/25/2018 0402   PCO2ART 50.2 (H) 09/25/2018 0402   PO2ART 90.0 09/25/2018 0402   HCO3 27.6 09/25/2018 0402   TCO2 29 09/25/2018 0402   O2SAT 84.3 09/26/2018 0345     Coagulation Profile: No results for input(s): INR, PROTIME in the last 168 hours.  Cardiac Enzymes: No results for input(s): CKTOTAL, CKMB, CKMBINDEX, TROPONINI in the last 168 hours.  HbA1C: Hemoglobin A1C  Date/Time Value Ref Range Status  09/13/2018 11:06 AM 7.4 (A) 4.0 - 5.6 % Final  06/07/2018 10:44 AM 7.0 (A) 4.0 - 5.6 % Final   Hgb A1c MFr Bld  Date/Time Value Ref Range Status  01/11/2015 03:48 PM 6.9 (H) 4.8 - 5.6 % Final    Comment:    (NOTE)         Pre-diabetes: 5.7 - 6.4         Diabetes: >6.4         Glycemic control for adults with diabetes: <7.0   05/08/2010 01:39 PM 5.9 %     CBG: Recent Labs  Lab 09/25/18 1150 09/25/18 1535 09/25/18 1933 09/25/18 2349 09/26/18 0344  GLUCAP 119* 126* 112* 107* 105*    CCT 35 mins  Kennieth Rad, MSN, AGACNP-BC [facility] Pgr: [phone] or if no answer [phone] 09/26/2018, 9:18 AM   PCCM attending:  68 year old female status post cardiac arrest, PEA presumed hypoxic driven.  Patient developed pulmonary edema requiring intubation mechanical ventilation.  She has acute on chronic diastolic heart failure with severe pulmonary hypertension status post right heart catheterization chronic atrial fibrillation on heparin drip.  She also developed acute on chronic kidney disease with acute renal failure requiring CVVHD.  No issues overnight patient remains comfortable on mechanical ventilator and life support this morning.  She is following base commands.  BP 120/66   Pulse (!) 55   Temp 98.1 F (36.7 C)   Resp 12   Wt 127.6 kg   SpO2 96%   BMI 54.94 kg/m   General: Critically ill morbidly obese female resting on mechanical ventilation following basic commands. HEENT: Mucous membranes moist, anicteric Neuro: Awakens to voice commands.  Following commands yes no, moves all 4 extremities Heart: Regular rate rhythm, S1-S2 Lungs: Clear to auscultation bilaterally, bilateral ventilated breath sounds. Extremities: Lower extremities wrapped bandages boots in place  Chest x-ray: Bilateral vascular congestion pulmonary edema The patient's images have been independently reviewed by me.    Labs reviewed  A: Cardiac arrest, PEA Acute hypoxemic respiratory failure  requiring intubation mechanical ventilation Bilateral pulmonary edema Acute on chronic diastolic heart failure Severe pulmonary hypertension Chronic atrial fibrillation on heparin Acute renal failure requiring CVVHD volume removal. Type 2 diabetes morbidly obese.  P: Chest x-ray reviewed stable lines and tubes, still with bilateral pulmonary edema CVVHD for volume removal.  Remains on full mechanical ventilatory support. Remains in the intensive care unit for close  hemodynamic respiratory support. Continue PRVC 8 cc/kg Wean FiO2 as tolerated maintain sats greater than 90%. [place] cardiology nephrology recommendations. Once euvolemic will consider liberation from mechanical ventilator. As her FiO2 requirements come down we can consider liberation from ventilator. Daily SBT SAT as tolerated.  This patient is critically ill with multiple organ system failure; which, requires frequent high complexity decision making, assessment, support, evaluation, and titration of therapies. This was completed through the application of advanced monitoring technologies and extensive interpretation of multiple databases. During this encounter critical care time was devoted to patient care services described in this note for 32 minutes.   Garner Nash, DO [facility] 09/26/2018 10:59 AM  Personal pager: [phone] If unanswered, please page CCM On-call: [phone]

## 2018-09-26 NOTE — Progress Notes (Signed)
Advanced Heart Failure Rounding Note   Subjective:    Remains on vent and CRRT. Sedated. Pulling about 200/hr on NE at 20. CVP 18  Off amio due to bradycardia. Rhythm stable.   Objective:   Weight Range:  Vital Signs:   Temp:  [93.9 F (34.4 C)-99.7 F (37.6 C)] 96.1 F (35.6 C) (06/01 1000) Pulse Rate:  [50-67] 56 (06/01 1000) Resp:  [12-25] 14 (06/01 1000) BP: (96-133)/(32-66) 120/66 (06/01 1000) SpO2:  [88 %-98 %] 96 % (06/01 1000) Arterial Line BP: (90-144)/(39-58) 127/39 (06/01 1000) FiO2 (%):  [50 %-60 %] 50 % (06/01 0800) Weight:  [127.6 kg] 127.6 kg (06/01 0429) Last BM Date: 09/22/18  Weight change: Filed Weights   09/24/18 0446 09/25/18 0500 09/26/18 0429  Weight: (!) 137.8 kg 132.2 kg 127.6 kg    Intake/Output:   Intake/Output Summary (Last 24 hours) at 09/26/2018 1003 Last data filed at 09/26/2018 0900 Gross per 24 hour  Intake 1089.14 ml  Output 5553 ml  Net -4463.86 ml     Physical Exam: General:  Intubated sedated. HEENT: normal Neck: supple. RIJ Trialysis . Carotids 2+ bilat; no bruits. No lymphadenopathy or thryomegaly appreciated. Cor: PMI nondisplaced. IRR IRR No rubs, gallops or murmurs. Lungs: clear Abdomen: markedly obese soft, nontender, nondistended. No hepatosplenomegaly. No bruits or masses. Good bowel sounds. Extremities: no cyanosis, clubbing, rash, 2-3+ edema Neuro: sedated on vent    Telemetry: AF 50-60s with PVCs/NSVT/bigeminy. Personally reviewed   Labs: Basic Metabolic Panel: Recent Labs  Lab 09/22/18 1420  09/24/18 0228  09/24/18 1600 09/25/18 0400 09/25/18 0402 09/25/18 1614 09/26/18 0357  NA 140   < > 138   < > 137 139 140 139 138  K 3.9   < > 4.1   < > 4.0 3.8 3.8 4.5 4.9  CL 101   < > 100  --  98 101  --  100 104  CO2 29   < > 27  --  24 26  --  25 25  GLUCOSE 153*   < > 155*  --  212* 124*  --  137* 116*  BUN 76*   < > 77*  --  75* 54*  --  39* 32*  CREATININE 2.84*   < > 3.08*  --  3.05* 2.41*  --   1.97* 1.96*  CALCIUM 9.0   < > 8.2*  --  8.4* 8.3*  --  8.4* 8.2*  MG 2.4  --   --   --   --  2.3  --   --  2.3  PHOS  --   --   --   --  4.1 3.1  --  3.1 2.7   < > = values in this interval not displayed.    Liver Function Tests: Recent Labs  Lab 09/22/18 1420 09/24/18 1600 09/25/18 0400 09/25/18 1614 09/26/18 0357  AST 18  --   --   --   --   ALT 13  --   --   --   --   ALKPHOS 127*  --   --   --   --   BILITOT 1.2  --   --   --   --   PROT 7.2  --   --   --   --   ALBUMIN 3.2* 2.9* 2.9* 2.8* 2.7*   No results for input(s): LIPASE, AMYLASE in the last 168 hours. No results for input(s): AMMONIA in the last 168  hours.  CBC: Recent Labs  Lab 09/22/18 1420  09/23/18 0831 09/23/18 0836 09/24/18 1320 09/24/18 1551 09/25/18 0402  WBC 5.7  --   --   --   --   --   --   NEUTROABS 3.9  --   --   --   --   --   --   HGB 10.9*   < > 11.6* 10.9* 11.2* 11.2* 11.9*  HCT 38.0   < > 34.0* 32.0* 33.0* 33.0* 35.0*  MCV 85.2  --   --   --   --   --   --   PLT 133*  --   --   --   --   --   --    < > = values in this interval not displayed.    Cardiac Enzymes: No results for input(s): CKTOTAL, CKMB, CKMBINDEX, TROPONINI in the last 168 hours.  BNP: BNP (last 3 results) Recent Labs    09/22/18 1420  BNP 451.2*    ProBNP (last 3 results) No results for input(s): PROBNP in the last 8760 hours.    Other results:  Imaging: Dg Chest Port 1 View  Result Date: 09/26/2018 CLINICAL DATA:  Check endotracheal tube placement EXAM: PORTABLE CHEST 1 VIEW COMPARISON:  09/24/2018 FINDINGS: Endotracheal tube, gastric catheter and right jugular central lines are again seen and stable. Left-sided PICC line is noted at the confluence of the innominate veins. Cardiac shadow is mildly enlarged but accentuated by the frontal technique. Vascular congestion and edema is again identified stable in appearance from the prior exam. No new focal infiltrate is seen. IMPRESSION: Tubes and lines as  described above. Stable vascular congestion and pulmonary edema. Electronically Signed   By: Inez Catalina M.D.   On: 09/26/2018 07:15   Dg Chest Port 1 View  Result Date: 09/24/2018 CLINICAL DATA:  Central line placement.  History of CHF. EXAM: PORTABLE CHEST 1 VIEW COMPARISON:  Sep 24, 2018 FINDINGS: A left PICC line terminates near the confluence of the brachiocephalic veins. The ETT is in good position. A new right central line terminates in the SVC. No pneumothorax. Increasing bilateral pulmonary opacities, more focal in the right. NG tube terminates below today's film. No other acute abnormalities. IMPRESSION: 1. The new right PICC line terminates in the SVC without pneumothorax. Other support apparatus as above. 2. Increasing bilateral pulmonary opacities, right greater than left. While I suspect a component of pulmonary edema, underlying infection on the right should be considered. Electronically Signed   By: Dorise Bullion III M.D   On: 09/24/2018 13:59     Medications:     Scheduled Medications: . atorvastatin  40 mg Oral q1800  . chlorhexidine gluconate (MEDLINE KIT)  15 mL Mouth Rinse BID  . Chlorhexidine Gluconate Cloth  6 each Topical Daily  . feeding supplement (PRO-STAT SUGAR FREE 64)  30 mL Per Tube BID  . feeding supplement (VITAL HIGH PROTEIN)  1,000 mL Per Tube Q24H  . gabapentin  100 mg Per Tube BID  . insulin aspart  0-20 Units Subcutaneous Q4H  . insulin detemir  65 Units Subcutaneous Daily  . mouth rinse  15 mL Mouth Rinse 10 times per day  . sodium chloride flush  3 mL Intravenous Q12H  . sodium chloride flush  3 mL Intravenous Q12H    Infusions: .  prismasol BGK 4/2.5 500 mL/hr at 09/25/18 2359  .  prismasol BGK 4/2.5 300 mL/hr at 09/25/18 2130  . sodium  chloride    . sodium chloride Stopped (09/25/18 1604)  . amiodarone Stopped (09/25/18 1338)  . famotidine (PEPCID) IV Stopped (09/25/18 1433)  . fentaNYL infusion INTRAVENOUS Stopped (09/26/18 0804)  .  heparin 1,300 Units/hr (09/26/18 0900)  . midazolam Stopped (09/25/18 0909)  . norepinephrine (LEVOPHED) Adult infusion 20 mcg/min (09/26/18 0900)  . prismasol BGK 4/2.5 1,000 mL/hr at 09/25/18 2030    PRN Medications: sodium chloride, sodium chloride, acetaminophen, fentaNYL, midazolam, midazolam, ondansetron (ZOFRAN) IV, sodium chloride flush, sodium chloride flush, sodium chloride flush   Assessment/Plan:   1. PEA arrest on 5/30 - due to hypoxia/pulmonary edema. - On CVVHD Continue to get volume off  2. Acute on chronic hypoxic respiratory failure - due to OHS/OSA  - intubated on 5/30 in setting of arrest. Appreciate CCM's assistance. Weaned down to 50% FiO2 today - sleep study 4/15 without OSA (AHI 1.4) but had nocturnal desat - likely need to repeat at some point   3.  Acute on chronic diastolic HF - She has markedly elevated filling pressures complicated by severe cardiorenal syndrome - Now improving with CVVHD but still markedly volume overloaded. Continue NE to help pull at 200/hr.  Appreciate Renal input. - Suspect she may progress to long-term HD  4. Pulmonary HTN, severe  - this is mostly pulmonary venous HTN by cath [facility]) but also has a component of OHS/OSA (WHO group 3) - no role for selective pulmonary vasodilators - will improve significantly with volume removal - consider repeat RHC after volume removal   5. AKI on CKD 3 - baseline creatinine 1.9. Peaked at 3.2 -> 3.0 - likely cardiorenal - now on CVVHD. Plan as above. Continue NE to support volume removal and sedation . - Suspect she may progress to long-term HD  6. Morbid obesity - desperately needs weight loss  6. Chronic AF - rate is slow. Hold apixaban for possible HD cath. Switch to heparin  7. VT/NSVT - off amio due to bradycardia - Keep K> 4.0 Mg 2.0  CRITICAL CARE Performed by: Glori Bickers  Total critical care time: 35 minutes  Critical care time was exclusive of  separately billable procedures and treating other patients.  Critical care was necessary to treat or prevent imminent or life-threatening deterioration.  Critical care was time spent personally by me (independent of midlevel providers or residents) on the following activities: development of treatment plan with patient and/or surrogate as well as nursing, discussions with consultants, evaluation of patient's response to treatment, examination of patient, obtaining history from patient or surrogate, ordering and performing treatments and interventions, ordering and review of laboratory studies, ordering and review of radiographic studies, pulse oximetry and re-evaluation of patient's condition.  Length of Stay: 4  Glori Bickers MD 09/26/2018, 10:03 AM  Advanced Heart Failure Team Pager [phone] (M-F; 7a - 4p)  Please contact [facility] for night-coverage after hours (4p -7a ) and weekends on [email or web address]

## 2018-09-26 NOTE — Progress Notes (Signed)
Assisted tele visit to patient with family.  Richanda Darin P, RN  

## 2018-09-26 NOTE — Progress Notes (Signed)
Nutrition Follow-up  RD working remotely.  DOCUMENTATION CODES:   Morbid obesity  INTERVENTION:   Tube feeding: - Vital High Protein @ 41 ml/hr (984 ml/day) - Pro-stat 30 ml daily  Tube feeding regimen provides 1080 kcal, 101 grams of protein, and 823 ml of H2O (100% of needs).  NUTRITION DIAGNOSIS:   Inadequate oral intake related to inability to eat as evidenced by NPO status.  Ongoing, being addressed via TF  GOAL:   Provide needs based on ASPEN/SCCM guidelines  Met via TF  MONITOR:   Vent status, Labs, Weight trends, Skin, I & O's  REASON FOR ASSESSMENT:   Consult Enteral/tube feeding initiation and management  ASSESSMENT:   68 year old with morbid obesity, acute on chronic diastolic heart failure, pulmonary hypertension transferred to the ICU after PEA arrest.  Suspect respiratory failure due to pulmonary edema  5/30 - s/p PEA arrest, intubated and transferred to ICU, CRRT initiated  RD consulted for enteral nutrition initiation and management. Adult ICU Tube Feeding Protocol ordered. Pt remains on CRRT for volume removal. MD suspecting pt may progress to long-term HD.  OGT in place.  Weight down 18 lbs since admit.  Patient is currently intubated on ventilator support MV: 8.3 L/min Temp (24hrs), Avg:97.9 F (36.6 C), Min:93.9 F (34.4 C), Max:99.7 F (37.6 C) BP: 141/48 MAP: 72  Fentanyl: stopped this AM Levophed: 20.6 ml/hr Heparin: 13 ml/hr  Medications reviewed and include: SSI, Levemir 65 units daily, IV Pepcid  Labs reviewed: BUN 32 (H), creatinine 1.96 (H) CBG's: 105, 107, 112, 126, 119 x 24 hours  UOP: 276 ml x 24 hours CRRT UF: 5399 ml x 24 hours I/O's: -8.3 L since admit  Diet Order:   Diet Order            Diet NPO time specified  Diet effective now              EDUCATION NEEDS:   No education needs have been identified at this time  Skin:  Skin Assessment: Reviewed RN Assessment (MASD to breast, abdomen)  Last BM:   09/22/18  Height:   Ht Readings from Last 1 Encounters:  09/22/18 5' (1.524 m)    Weight:   Wt Readings from Last 1 Encounters:  09/26/18 127.6 kg    Ideal Body Weight:  45.5 kg  BMI:  Body mass index is 54.94 kg/m.  Estimated Nutritional Needs:   Kcal:  1000-1136  Protein:  91-114 grams  Fluid:  per MD    Gaynell Face, MS, RD, LDN Inpatient Clinical Dietitian Pager: [phone] Weekend/After Hours: [phone]

## 2018-09-26 NOTE — Progress Notes (Signed)
Subjective:  CRRT running without incident- overall removed 4500- is still on pressors but weaning- essentially no UOP - CVP 18- FIo2 just turned down to 50  Objective Vital signs in last 24 hours: Vitals:   09/26/18 0745 09/26/18 0747 09/26/18 0748 09/26/18 0800  BP:  (!) 121/50    Pulse: (!) 56 (!) 58  (!) 58  Resp: 18 20  17   Temp: 98.1 F (36.7 C) 98.1 F (36.7 C)  (!) 97.5 F (36.4 C)  TempSrc:      SpO2: 97% 98% 96% 96%  Weight:       Weight change: -4.6 kg  Intake/Output Summary (Last 24 hours) at 09/26/2018 6256 Last data filed at 09/26/2018 0800 Gross per 24 hour  Intake 1165.82 ml  Output 5737 ml  Net -4571.18 ml    Assessment/ Plan: Pt is a 69 y.o. yo female HTN, Afib, DM, morbid obesity, diastolic heart failure and also baseline CKD- crt mid to high 1's who was admitted on 09/22/2018 with volume overload, then suffered cardiac arrest  Assessment/Plan: 1. Renal- A on CRF after cardiac arrest on 5/30.  Baseline crt in the mid 1's.   Required initiation of CRRT early on 5/30- overall negative fluid balance on CRRT 2. HTN/volume-  Aggressive volume removal at 200 per hour- tolerating well so far with elevated CVP and high o2 req- told nursing as long as her BP tolerates , is OK to continue for now  3. Anemia- hgb over 11 4. [place]-  K and phos are OK for now    [place]: Basic Metabolic Panel: Recent Labs  Lab 09/25/18 0400 09/25/18 0402 09/25/18 1614 09/26/18 0357  NA 139 140 139 138  K 3.8 3.8 4.5 4.9  CL 101  --  100 104  CO2 26  --  25 25  GLUCOSE 124*  --  137* 116*  BUN 54*  --  39* 32*  CREATININE 2.41*  --  1.97* 1.96*  CALCIUM 8.3*  --  8.4* 8.2*  PHOS 3.1  --  3.1 2.7   Liver Function Tests: Recent Labs  Lab 09/22/18 1420  09/25/18 0400 09/25/18 1614 09/26/18 0357  AST 18  --   --   --   --   ALT 13  --   --   --   --   ALKPHOS 127*  --   --   --   --   BILITOT 1.2  --   --   --   --   PROT 7.2  --   --   --   --    ALBUMIN 3.2*   < > 2.9* 2.8* 2.7*   < > = values in this interval not displayed.   No results for input(s): LIPASE, AMYLASE in the last 168 hours. No results for input(s): AMMONIA in the last 168 hours. CBC: Recent Labs  Lab 09/22/18 1420  09/24/18 1320 09/24/18 1551 09/25/18 0402  WBC 5.7  --   --   --   --   NEUTROABS 3.9  --   --   --   --   HGB 10.9*   < > 11.2* 11.2* 11.9*  HCT 38.0   < > 33.0* 33.0* 35.0*  MCV 85.2  --   --   --   --   PLT 133*  --   --   --   --    < > = values in this interval  not displayed.   Cardiac Enzymes: No results for input(s): CKTOTAL, CKMB, CKMBINDEX, TROPONINI in the last 168 hours. CBG: Recent Labs  Lab 09/25/18 1150 09/25/18 1535 09/25/18 1933 09/25/18 2349 09/26/18 0344  GLUCAP 119* 126* 112* 107* 105*    Iron Studies: No results for input(s): IRON, TIBC, TRANSFERRIN, FERRITIN in the last 72 hours. Studies/Results: Dg Chest Port 1 View  Result Date: 09/26/2018 CLINICAL DATA:  Check endotracheal tube placement EXAM: PORTABLE CHEST 1 VIEW COMPARISON:  09/24/2018 FINDINGS: Endotracheal tube, gastric catheter and right jugular central lines are again seen and stable. Left-sided PICC line is noted at the confluence of the innominate veins. Cardiac shadow is mildly enlarged but accentuated by the frontal technique. Vascular congestion and edema is again identified stable in appearance from the prior exam. No new focal infiltrate is seen. IMPRESSION: Tubes and lines as described above. Stable vascular congestion and pulmonary edema. Electronically Signed   By: Inez Catalina M.D.   On: 09/26/2018 07:15   Dg Chest Port 1 View  Result Date: 09/24/2018 CLINICAL DATA:  Central line placement.  History of CHF. EXAM: PORTABLE CHEST 1 VIEW COMPARISON:  Sep 24, 2018 FINDINGS: A left PICC line terminates near the confluence of the brachiocephalic veins. The ETT is in good position. A new right central line terminates in the SVC. No pneumothorax.  Increasing bilateral pulmonary opacities, more focal in the right. NG tube terminates below today's film. No other acute abnormalities. IMPRESSION: 1. The new right PICC line terminates in the SVC without pneumothorax. Other support apparatus as above. 2. Increasing bilateral pulmonary opacities, right greater than left. While I suspect a component of pulmonary edema, underlying infection on the right should be considered. Electronically Signed   By: Dorise Bullion III M.D   On: 09/24/2018 13:59   Dg Chest Port 1 View  Result Date: 09/24/2018 CLINICAL DATA:  Intubation EXAM: PORTABLE CHEST 1 VIEW COMPARISON:  09/23/2018 FINDINGS: Endotracheal tube terminates 3.5 cm above the carina. Cardiomegaly with increased interstitial markings, possibly reflecting interstitial edema, although more focal opacity in the right mid lung raises concern for possible infection/pneumonia. Possible small left pleural effusion. No pneumothorax. Defibrillator pads overlying the left hemithorax. Left subclavian venous catheter versus left arm PICC terminates in the distal left brachiocephalic vein. Enteric tube courses into the stomach. IMPRESSION: Endotracheal tube terminates 3.5 cm above the carina. Left subclavian venous catheter versus left arm PICC terminates the distal left brachiocephalic vein. Focal right upper lobe opacity raises concern for pneumonia, less likely asymmetric interstitial edema. Possible small left pleural effusion. Electronically Signed   By: Julian Hy M.D.   On: 09/24/2018 09:34   Medications: Infusions: .  prismasol BGK 4/2.5 500 mL/hr at 09/25/18 2359  .  prismasol BGK 4/2.5 300 mL/hr at 09/25/18 2130  . sodium chloride    . sodium chloride Stopped (09/25/18 1604)  . amiodarone Stopped (09/25/18 1338)  . famotidine (PEPCID) IV Stopped (09/25/18 1433)  . fentaNYL infusion INTRAVENOUS 50 mcg/hr (09/26/18 0800)  . heparin 1,300 Units/hr (09/26/18 0800)  . midazolam Stopped (09/25/18 0909)   . norepinephrine (LEVOPHED) Adult infusion 20 mcg/min (09/26/18 0800)  . prismasol BGK 4/2.5 1,000 mL/hr at 09/25/18 2030    Scheduled Medications: . atorvastatin  40 mg Oral q1800  . chlorhexidine gluconate (MEDLINE KIT)  15 mL Mouth Rinse BID  . Chlorhexidine Gluconate Cloth  6 each Topical Daily  . gabapentin  100 mg Oral BID  . insulin aspart  0-20 Units Subcutaneous Q4H  .  insulin detemir  65 Units Subcutaneous Daily  . mouth rinse  15 mL Mouth Rinse 10 times per day  . sodium chloride flush  3 mL Intravenous Q12H  . sodium chloride flush  3 mL Intravenous Q12H    have reviewed scheduled and prn medications.  Physical Exam: General: obese, sedated on vent Heart: brady  Lungs: CBS bilat Abdomen: obese, non tender Extremities: significant pitting edema Dialysis Access: right IJ vascath placed 5/30     09/26/2018,8:07 AM  LOS: 4 days

## 2018-09-26 NOTE — Progress Notes (Signed)
Pharmacy is unable to confirm the medications the patient was taking at home completely. Pt is intubated. We have gathered info from the pharmacy, the patient's family, and the patient's medication bottles.  Where possible, their outpatient pharmacy(s) have been contacted for the last time prescriptions were filled and that information has been added to each medication in an Order Note (highlighted yellow below the medication).  Please contact pharmacy if further assistance is needed.   Romeo Rabon, PharmD. Mobile: [phone]. 09/26/2018,1:47 PM.

## 2018-09-27 ENCOUNTER — Inpatient Hospital Stay (HOSPITAL_COMMUNITY): Payer: Medicare HMO

## 2018-09-27 DIAGNOSIS — E8779 Other fluid overload: Secondary | ICD-10-CM

## 2018-09-27 LAB — RENAL FUNCTION PANEL
Albumin: 2.7 g/dL — ABNORMAL LOW (ref 3.5–5.0)
Albumin: 2.5 g/dL — ABNORMAL LOW (ref 3.5–5.0)
Anion gap: 9 (ref 5–15)
Anion gap: 7 (ref 5–15)
BUN: 29 mg/dL — ABNORMAL HIGH (ref 8–23)
BUN: 32 mg/dL — ABNORMAL HIGH (ref 8–23)
CO2: 25 mmol/L (ref 22–32)
CO2: 27 mmol/L (ref 22–32)
Calcium: 8.5 mg/dL — ABNORMAL LOW (ref 8.9–10.3)
Calcium: 8.3 mg/dL — ABNORMAL LOW (ref 8.9–10.3)
Chloride: 102 mmol/L (ref 98–111)
Chloride: 102 mmol/L (ref 98–111)
Creatinine, Ser: 1.87 mg/dL — ABNORMAL HIGH (ref 0.44–1.00)
Creatinine, Ser: 1.91 mg/dL — ABNORMAL HIGH (ref 0.44–1.00)
GFR calc Af Amer: 32 mL/min — ABNORMAL LOW (ref >60)
GFR calc Af Amer: 31 mL/min — ABNORMAL LOW (ref >60)
GFR calc non Af Amer: 28 mL/min — ABNORMAL LOW (ref >60)
GFR calc non Af Amer: 27 mL/min — ABNORMAL LOW (ref >60)
Glucose, Bld: 158 mg/dL — ABNORMAL HIGH (ref 70–99)
Glucose, Bld: 201 mg/dL — ABNORMAL HIGH (ref 70–99)
Phosphorus: 2.1 mg/dL — ABNORMAL LOW (ref 2.5–4.6)
Phosphorus: 3 mg/dL (ref 2.5–4.6)
Potassium: 4.5 mmol/L (ref 3.5–5.1)
Potassium: 4.1 mmol/L (ref 3.5–5.1)
Sodium: 136 mmol/L (ref 135–145)
Sodium: 136 mmol/L (ref 135–145)

## 2018-09-27 LAB — CBC
HCT: 35.8 % — ABNORMAL LOW (ref 36.0–46.0)
Hemoglobin: 10.1 g/dL — ABNORMAL LOW (ref 12.0–15.0)
MCH: 24.9 pg — ABNORMAL LOW (ref 26.0–34.0)
MCHC: 28.2 g/dL — ABNORMAL LOW (ref 30.0–36.0)
MCV: 88.4 fL (ref 80.0–100.0)
Platelets: 150 10*3/uL (ref 150–400)
RBC: 4.05 MIL/uL (ref 3.87–5.11)
RDW: 18.5 % — ABNORMAL HIGH (ref 11.5–15.5)
WBC: 6.9 10*3/uL (ref 4.0–10.5)
nRBC: 0 % (ref 0.0–0.2)

## 2018-09-27 LAB — POCT I-STAT 7, (LYTES, BLD GAS, ICA,H+H)
Acid-Base Excess: 1 mmol/L (ref 0.0–2.0)
Bicarbonate: 28.4 mmol/L — ABNORMAL HIGH (ref 20.0–28.0)
Calcium, Ion: 1.19 mmol/L (ref 1.15–1.40)
HCT: 35 % — ABNORMAL LOW (ref 36.0–46.0)
Hemoglobin: 11.9 g/dL — ABNORMAL LOW (ref 12.0–15.0)
O2 Saturation: 92 %
Potassium: 4.7 mmol/L (ref 3.5–5.1)
Sodium: 138 mmol/L (ref 135–145)
TCO2: 30 mmol/L (ref 22–32)
pCO2 arterial: 54.7 mmHg — ABNORMAL HIGH (ref 32.0–48.0)
pH, Arterial: 7.324 — ABNORMAL LOW (ref 7.350–7.450)
pO2, Arterial: 69 mmHg — ABNORMAL LOW (ref 83.0–108.0)

## 2018-09-27 LAB — COOXEMETRY PANEL
Carboxyhemoglobin: 2.2 % — ABNORMAL HIGH (ref 0.5–1.5)
Methemoglobin: 1.6 % — ABNORMAL HIGH (ref 0.0–1.5)
O2 Saturation: 68.1 %
Total hemoglobin: 11.2 g/dL — ABNORMAL LOW (ref 12.0–16.0)

## 2018-09-27 LAB — APTT
aPTT: 103 seconds — ABNORMAL HIGH (ref 24–36)
aPTT: 114 seconds — ABNORMAL HIGH (ref 24–36)

## 2018-09-27 LAB — MAGNESIUM
Magnesium: 2.4 mg/dL (ref 1.7–2.4)
Magnesium: 2.4 mg/dL (ref 1.7–2.4)

## 2018-09-27 LAB — GLUCOSE, CAPILLARY
Glucose-Capillary: 131 mg/dL — ABNORMAL HIGH (ref 70–99)
Glucose-Capillary: 147 mg/dL — ABNORMAL HIGH (ref 70–99)
Glucose-Capillary: 179 mg/dL — ABNORMAL HIGH (ref 70–99)
Glucose-Capillary: 185 mg/dL — ABNORMAL HIGH (ref 70–99)
Glucose-Capillary: 154 mg/dL — ABNORMAL HIGH (ref 70–99)

## 2018-09-27 LAB — HEPARIN LEVEL (UNFRACTIONATED): Heparin Unfractionated: 2.06 IU/mL — ABNORMAL HIGH (ref 0.30–0.70)

## 2018-09-27 MED ORDER — SODIUM CHLORIDE 0.9 % IV SOLN: INTRAVENOUS | Status: DC | PRN

## 2018-09-27 MED ORDER — SODIUM PHOSPHATES 45 MMOLE/15ML IV SOLN
20.0000 mmol | Freq: Once | INTRAVENOUS | Status: AC
  Administered 2018-09-27: 09:00:00 20 mmol via INTRAVENOUS
  Filled 2018-09-27: qty 6.67

## 2018-09-27 NOTE — Telephone Encounter (Addendum)
In this phone call we stressed the importance of coming in physically to the office. She initially refused, citing she had transportation issues and also had an eye doctor appointment to get to. Anderson [place], [place], called me with concerns over patient declining OV as patient wished for another option. I asked RMA to stress to patient that virtual visit was not an option at this point due to fear of worsening fluid status and kidney failure. We advised that cardiac status took precedence over eye appt. We advised that she could either go to the ER which she was unwilling to do or come into the office. She elected the latter and requested to schedule for Thurs 5/28 so that her sister could bring her.  Dayna Dunn PA-C  Addendum 09/27/2018: Events of hospitalization noted. Appreciate the multidisciplinary care being provided. Will also cc to Dr. Radford Pax so she is aware. Dayna Dunn PA-C

## 2018-09-27 NOTE — Progress Notes (Signed)
NAME:  Kathleen [name], MRN:  [id number], DOB:  February 01, 1951, LOS: 5 ADMISSION DATE:  09/22/2018, CONSULTATION DATE: 09/24/2018 REFERRING MD: Candee Furbish MD, CHIEF COMPLAINT: Cardiac arrest  Brief History   68 yo female who failed outpatient diuretics management due to chronic kidney disease.  Right heart cath on 5/29 showed markedly elevated biventricular filling pressures with normal cardiac output.  She is on cardiology service, maintained on milrinone and Lasix.  On the morning of 5/30 she went into PEA arrest, CPR performed for 8 minutes.  Intubated and transferred to ICU.  Started on CRRT 5/31.  Past Medical History   has a past medical history of Atrial flutter ([place]), Bradycardia, Chronic diastolic (congestive) heart failure ([place]) (01/10/2015), CKD (chronic kidney disease), stage IV ([place]), Degenerative joint disease of hand, Diabetes mellitus, Dyslipidemia, Fecal occult blood test positive, GERD (gastroesophageal reflux disease), Headache, Hypertension, Inadequate material resources, Irritable bowel syndrome, Morbid obesity ([place]), Obesity hypoventilation syndrome ([place]), Post-menopausal bleeding, and Shortness of breath dyspnea.  Significant Hospital Events   5/28 Admit 5/29 RHC 5/30 PEA arrest, intubated/ CRRT 5/31  Milrinone stopped due to ectopy; brady episode- amio stopped 6/1- 6/2 attempt at SBT, chest x-ray with worsening pulmonary edema, CVVHD for volume removal  Consults:  Cardiology PCCM  Nephrology   Procedures:  Lt PICC 5/29 >> OETT 5/30 >> R IJ HD cath 5/30 >> R radial Aline >> initially documented as a right radial A-line however this was actually placed in the left ulnar by cardiology services  Significant Diagnostic Tests:  TTE 5/28 >> 1. The left ventricle has normal systolic function with an ejection fraction of 60-65%. The cavity size was normal. Left ventricular diastolic Doppler parameters are indeterminate. No evidence of left ventricular regional wall motion  abnormalities.  2. The right ventricle has normal systolic function. The cavity was mildly enlarged. There is no increase in right ventricular wall thickness.  3. Right atrial size was mildly dilated.  4. There is mild mitral annular calcification present.  5. The aortic valve is tricuspid. Mild thickening of the aortic valve. Mild calcification of the aortic valve. Aortic valve regurgitation is trivial by color flow Doppler.  6. The aortic root is normal in size and structure.  Right heart cath 5/29 RA = 24 RV = 94/26 PA = 95/36 (53) PCW = 30 (v = 50) Fick cardiac output/index = 6.0/2.7 PVR =3.9 WU FA sat = 91% PA sat = 58%, 58% SVC 60%  Micro Data:  5/28 SARS coronavirus 2 cepheid >> neg 5/28 MRSA PCR >> neg  Antimicrobials:   Interim history/subjective:  Stable overnight.  Remains on mechanical ventilatory support remains on CVVHD.  Was placed on SBT this morning patient in pressure Support ventilation 10/8 slowly with dropping tidal volumes.  At the time of exam patient's tidal volumes at 10/8 were only to 85-300.  And she become more tachypneic.  Patient was placed back in a rate with PRVC.  This was discussed with respiratory therapy.  Left ulnar arterial line was removed overnight.  This was discussed with nursing staff at bedside.  Per documentation in the chart there was a right radial arterial line that was placed however this was actually placed within the left ulnar after an attempt at a left radial was unsuccessful.  Fingers on exam are warm with normal capillary refill in the left and right hand.  Objective   Blood pressure 126/62, pulse (!) 57, temperature 99.5 F (37.5 C), temperature source Esophageal, resp. rate 15,  weight 124.3 kg, SpO2 99 %. CVP:  [11 mmHg-14 mmHg] 11 mmHg  Vent Mode: PSV;CPAP FiO2 (%):  [40 %-50 %] 40 % Set Rate:  [20 bmp] 20 bmp Vt Set:  [360 mL] 360 mL PEEP:  [8 cmH20] 8 cmH20 Pressure Support:  [10 cmH20] 10 cmH20 Plateau Pressure:  [12  cmH20-27 cmH20] 18 cmH20   Intake/Output Summary (Last 24 hours) at 09/27/2018 0956 Last data filed at 09/27/2018 0900 Gross per 24 hour  Intake 2213.94 ml  Output 6552 ml  Net -4338.06 ml   Filed Weights   09/25/18 0500 09/26/18 0429 09/27/18 0110  Weight: 132.2 kg 127.6 kg 124.3 kg    Examination: General: Critically ill morbidly obese female resting in bed on mechanical life support HEENT: Mucous membranes moist, pupils reactive, ET tube in place Neuro: Alert oriented following commands on mechanical support able to nod head yes no to basic questions CV: Regular rate and rhythm, no MRG, S1-S2 PULM: Bilateral ventilated breath sounds GI: Soft nontender nondistended bowel sounds present morbidly obese pannus Extremities: No significant lower extremity edema Skin: No rash  Resolved Hospital Problem list    Assessment & Plan:   Cardiac arrest, PEA- Suspect hypoxemic driven. Acute hypoxemic respiratory failure requiring intubation mechanical ventilation secondary to pulmonary edema Chest x-ray reviewed 09/27/2018 worsening bilateral pulmonary edema Patient remains in the intensive care unit for close hemodynamic respiratory support. Continue full mechanical ventilation in PRVC at this time. Patient failed spontaneous breathing trial this morning in pressure support ventilation due to decreasing tidal volumes. Continue to wean FiO2 to maintain goal sat greater than 90%. Chest x-ray in the a.m.  To evaluate any change in pulmonary edema. Continue diuresis with CVVHD for volume removal. Unable to liberate from mechanical ventilator due to worsening pulmonary edema today. We will evaluate again later this afternoon or tomorrow.  Acute on chronic diastolic heart failure Severe pulmonary hypertension status post right heart catheterization Chronic A. fib Telemetry monitoring Volume removal CVVHD Continue vasopressor support maintain mean arterial pressure greater than 65 Follow I's  and O's, maintain euvolemia Heparin drip for anticoagulation  Acute renal failure AKI on chronic kidney disease Likely cardiorenal syndrome CVVHD management per nephrology  Diabetes mellitus type II Morbid obesity SSI with CBGs plus Levemir  Best practice:  Diet: NPO, start TF  Pain/Anxiety/Delirium protocol (if indicated): PRN Versed/ fentanyl  VAP protocol (if indicated): Yes DVT prophylaxis: heparin gtt GI prophylaxis: Pepcid Glucose control: SSI, Levemir Mobility: Bed Code Status: Full Family Communication: Per primary Disposition: ICU  Labs   CBC: Recent Labs  Lab 09/22/18 1420  09/24/18 1320 09/24/18 1551 09/25/18 0402 09/27/18 0430 09/27/18 0436  WBC 5.7  --   --   --   --  6.9  --   NEUTROABS 3.9  --   --   --   --   --   --   HGB 10.9*   < > 11.2* 11.2* 11.9* 10.1* 11.9*  HCT 38.0   < > 33.0* 33.0* 35.0* 35.8* 35.0*  MCV 85.2  --   --   --   --  88.4  --   PLT 133*  --   --   --   --  150  --    < > = values in this interval not displayed.    Basic Metabolic Panel: Recent Labs  Lab 09/22/18 1420  09/25/18 0400  09/25/18 1614 09/26/18 0357 09/26/18 1552 09/27/18 0430 09/27/18 0436  NA 140   < >  139   < > 139 138 136 136 138  K 3.9   < > 3.8   < > 4.5 4.9 4.7 4.5 4.7  CL 101   < > 101  --  100 104 102 102  --   CO2 29   < > 26  --  25 25 26 25   --   GLUCOSE 153*   < > 124*  --  137* 116* 169* 158*  --   BUN 76*   < > 54*  --  39* 32* 31* 29*  --   CREATININE 2.84*   < > 2.41*  --  1.97* 1.96* 1.83* 1.87*  --   CALCIUM 9.0   < > 8.3*  --  8.4* 8.2* 8.4* 8.5*  --   MG 2.4  --  2.3  --   --  2.3 2.5* 2.4  --   PHOS  --    < > 3.1  --  3.1 2.7 2.7 2.1*  --    < > = values in this interval not displayed.   GFR: Estimated Creatinine Clearance: 36 mL/min (A) (by C-G formula based on SCr of 1.87 mg/dL (H)). Recent Labs  Lab 09/22/18 1420 09/27/18 0430  WBC 5.7 6.9    Liver Function Tests: Recent Labs  Lab 09/22/18 1420  09/25/18 0400  09/25/18 1614 09/26/18 0357 09/26/18 1552 09/27/18 0430  AST 18  --   --   --   --   --   --   ALT 13  --   --   --   --   --   --   ALKPHOS 127*  --   --   --   --   --   --   BILITOT 1.2  --   --   --   --   --   --   PROT 7.2  --   --   --   --   --   --   ALBUMIN 3.2*   < > 2.9* 2.8* 2.7* 2.8* 2.7*   < > = values in this interval not displayed.   No results for input(s): LIPASE, AMYLASE in the last 168 hours. No results for input(s): AMMONIA in the last 168 hours.  ABG    Component Value Date/Time   PHART 7.324 (L) 09/27/2018 0436   PCO2ART 54.7 (H) 09/27/2018 0436   PO2ART 69.0 (L) 09/27/2018 0436   HCO3 28.4 (H) 09/27/2018 0436   TCO2 30 09/27/2018 0436   O2SAT 68.1 09/27/2018 0800     Coagulation Profile: No results for input(s): INR, PROTIME in the last 168 hours.  Cardiac Enzymes: No results for input(s): CKTOTAL, CKMB, CKMBINDEX, TROPONINI in the last 168 hours.  HbA1C: Hemoglobin A1C  Date/Time Value Ref Range Status  09/13/2018 11:06 AM 7.4 (A) 4.0 - 5.6 % Final  06/07/2018 10:44 AM 7.0 (A) 4.0 - 5.6 % Final   Hgb A1c MFr Bld  Date/Time Value Ref Range Status  01/11/2015 03:48 PM 6.9 (H) 4.8 - 5.6 % Final    Comment:    (NOTE)         Pre-diabetes: 5.7 - 6.4         Diabetes: >6.4         Glycemic control for adults with diabetes: <7.0   05/08/2010 01:39 PM 5.9 %     CBG: Recent Labs  Lab 09/26/18 1601 09/26/18 1933 09/26/18 2332 09/27/18 0438 09/27/18 0810  GLUCAP  144* 156* 142* 131* 147*   This patient is critically ill with multiple organ system failure; which, requires frequent high complexity decision making, assessment, support, evaluation, and titration of therapies. This was completed through the application of advanced monitoring technologies and extensive interpretation of multiple databases. During this encounter critical care time was devoted to patient care services described in this note for 34 minutes.   Garner Nash, DO  [facility] 09/27/2018 9:56 AM  Personal pager: [phone] If unanswered, please page CCM On-call: [phone]

## 2018-09-27 NOTE — Progress Notes (Addendum)
[facility] for Heparin Indication: atrial fibrillation  Allergies  Allergen Reactions  . Doxycycline     REACTION: Wheals and pruritus    Patient Measurements: Weight: 274 lb 0.5 oz (124.3 kg)  Vital Signs: Temp: 98.6 F (37 C) (06/02 1230) Temp Source: Esophageal (06/02 1230) BP: 118/54 (06/02 1230) Pulse Rate: 57 (06/02 1134)  Labs: Recent Labs    09/25/18 0402  09/26/18 0357 09/26/18 0358 09/26/18 1552 09/27/18 0430 09/27/18 0436 09/27/18 1256  HGB 11.9*  --   --   --   --  10.1* 11.9*  --   HCT 35.0*  --   --   --   --  35.8* 35.0*  --   PLT  --   --   --   --   --  150  --   --   APTT  --    < >  --  64*  --  103*  --  114*  HEPARINUNFRC  --   --  >2.20*  --   --  2.06*  --   --   CREATININE  --    < > 1.96*  --  1.83* 1.87*  --   --    < > = values in this interval not displayed.    Estimated Creatinine Clearance: 36 mL/min (A) (by C-G formula based on SCr of 1.87 mg/dL (H)).   Medical History: Past Medical History:  Diagnosis Date  . Atrial flutter ([place])    a. permanent.  . Bradycardia   . Chronic diastolic (congestive) heart failure ([place]) 01/10/2015  . CKD (chronic kidney disease), stage IV ([place])   . Degenerative joint disease of hand   . Diabetes mellitus   . Dyslipidemia   . Fecal occult blood test positive   . GERD (gastroesophageal reflux disease)   . Headache   . Hypertension   . Inadequate material resources   . Irritable bowel syndrome   . Morbid obesity ([place])   . Obesity hypoventilation syndrome ([place])   . Post-menopausal bleeding   . Shortness of breath dyspnea    multifactorial from obesity, deconditioning, obesity hypoventilation syndrome    Assessment: Pt is a 67yoF with a history of afib. She was on Eliquis PTA, received in hospital, last dose 5/29. Apixaban on hold for potential need for [facility], currently on CRRT. Pharmacy consulted to start heparin. Eliquis interferes with HL monitoring, will  monitor aPTT until correlating.  HL remains artificially high with recent Eliquis use. APTT slightly above goal at 103 on heparin 1300 units/hr. CBC low but stable. No signs/symptoms of bleeding or issues with infusion reported by nursing.  Goal of Therapy:  Heparin level 0.3-0.7 units/ml  APTT 66-102 Monitor platelets by anticoagulation protocol: Yes   Plan:  Confirm aPTT later this afternoon Continue heparin 1300 units/hr peripherally  Check aPTT/HL daily while on heparin   Claiborne Billings, PharmD PGY2 Cardiology Pharmacy Resident Please check AMION for all Pharmacist numbers by unit 09/27/2018 2:02 PM    Addendum: Repeat aPTT remains elevated at 114. Decrease heparin to 1200 units/hr. Repeat aPTT 8 hours.   Claiborne Billings, PharmD PGY2 Cardiology Pharmacy Resident Please check AMION for all Pharmacist numbers by unit 09/27/2018 2:02 PM

## 2018-09-27 NOTE — Progress Notes (Signed)
Advanced Heart Failure Rounding Note   Subjective:    Remains on vent and CRRT. Sedated. Pulling about 200/hr. Weight down 7 pounds overnight. (25 pounds total) NE ranging 15-20. CVP 15. Tmax 100.4  CXR with worsening edema   Off amio due to bradycardia. Rhythm stable   Objective:   Weight Range:  Vital Signs:   Temp:  [97.5 F (36.4 C)-100.4 F (38 C)] 99 F (37.2 C) (06/02 1400) Pulse Rate:  [51-63] 57 (06/02 1134) Resp:  [10-31] 14 (06/02 1400) BP: (105-141)/(38-65) 120/51 (06/02 1400) SpO2:  [91 %-100 %] 100 % (06/02 1400) Arterial Line BP: (29-149)/(8-65) 29/8 (06/02 0533) FiO2 (%):  [40 %] 40 % (06/02 1400) Weight:  [124.3 kg] 124.3 kg (06/02 0110) Last BM Date: 09/22/18  Weight change: Filed Weights   09/25/18 0500 09/26/18 0429 09/27/18 0110  Weight: 132.2 kg 127.6 kg 124.3 kg    Intake/Output:   Intake/Output Summary (Last 24 hours) at 09/27/2018 1447 Last data filed at 09/27/2018 1400 Gross per 24 hour  Intake 2401.8 ml  Output 5989 ml  Net -3587.2 ml     Physical Exam: General:  Intubated/sedated  HEENT: normal + ETT Neck: supple. RIJ trialysis Carotids 2+ bilat; no bruits. No lymphadenopathy or thryomegaly appreciated. Cor: PMI nondisplaced. Mildly irregular  No rubs, gallops or murmurs. Lungs: + crackles Abdomen: markedly obese soft, nontender, nondistended. No bruits or masses. Good bowel sounds. Extremities: no cyanosis, clubbing, rash, 3+ edema Neuro: sedated on vent t   Telemetry: AF/FL 50-60s with PVCs/NSVT/bigeminy. Personally reviewed   Labs: Basic Metabolic Panel: Recent Labs  Lab 09/22/18 1420  09/25/18 0400  09/25/18 1614 09/26/18 0357 09/26/18 1552 09/27/18 0430 09/27/18 0436  NA 140   < > 139   < > 139 138 136 136 138  K 3.9   < > 3.8   < > 4.5 4.9 4.7 4.5 4.7  CL 101   < > 101  --  100 104 102 102  --   CO2 29   < > 26  --  25 25 26 25   --   GLUCOSE 153*   < > 124*  --  137* 116* 169* 158*  --   BUN 76*   < > 54*   --  39* 32* 31* 29*  --   CREATININE 2.84*   < > 2.41*  --  1.97* 1.96* 1.83* 1.87*  --   CALCIUM 9.0   < > 8.3*  --  8.4* 8.2* 8.4* 8.5*  --   MG 2.4  --  2.3  --   --  2.3 2.5* 2.4  --   PHOS  --    < > 3.1  --  3.1 2.7 2.7 2.1*  --    < > = values in this interval not displayed.    Liver Function Tests: Recent Labs  Lab 09/22/18 1420  09/25/18 0400 09/25/18 1614 09/26/18 0357 09/26/18 1552 09/27/18 0430  AST 18  --   --   --   --   --   --   ALT 13  --   --   --   --   --   --   ALKPHOS 127*  --   --   --   --   --   --   BILITOT 1.2  --   --   --   --   --   --   PROT 7.2  --   --   --   --   --   --  ALBUMIN 3.2*   < > 2.9* 2.8* 2.7* 2.8* 2.7*   < > = values in this interval not displayed.   No results for input(s): LIPASE, AMYLASE in the last 168 hours. No results for input(s): AMMONIA in the last 168 hours.  CBC: Recent Labs  Lab 09/22/18 1420  09/24/18 1320 09/24/18 1551 09/25/18 0402 09/27/18 0430 09/27/18 0436  WBC 5.7  --   --   --   --  6.9  --   NEUTROABS 3.9  --   --   --   --   --   --   HGB 10.9*   < > 11.2* 11.2* 11.9* 10.1* 11.9*  HCT 38.0   < > 33.0* 33.0* 35.0* 35.8* 35.0*  MCV 85.2  --   --   --   --  88.4  --   PLT 133*  --   --   --   --  150  --    < > = values in this interval not displayed.    Cardiac Enzymes: No results for input(s): CKTOTAL, CKMB, CKMBINDEX, TROPONINI in the last 168 hours.  BNP: BNP (last 3 results) Recent Labs    09/22/18 1420  BNP 451.2*    ProBNP (last 3 results) No results for input(s): PROBNP in the last 8760 hours.    Other results:  Imaging: Dg Chest Port 1 View  Result Date: 09/27/2018 CLINICAL DATA:  Check endotracheal tube placement EXAM: PORTABLE CHEST 1 VIEW COMPARISON:  09/26/2018 FINDINGS: Cardiac shadow remains enlarged. Endotracheal tube, gastric catheter and right jugular central line are noted in satisfactory position. Left-sided PICC line is noted as well. Diffuse vascular congestion is  again seen with some increase in the degree of pulmonary edema when compare with the prior exam. No bony abnormality is noted. IMPRESSION: Increase in the degree of pulmonary edema. Electronically Signed   By: Inez Catalina M.D.   On: 09/27/2018 07:45   Dg Chest Port 1 View  Result Date: 09/26/2018 CLINICAL DATA:  Check endotracheal tube placement EXAM: PORTABLE CHEST 1 VIEW COMPARISON:  09/24/2018 FINDINGS: Endotracheal tube, gastric catheter and right jugular central lines are again seen and stable. Left-sided PICC line is noted at the confluence of the innominate veins. Cardiac shadow is mildly enlarged but accentuated by the frontal technique. Vascular congestion and edema is again identified stable in appearance from the prior exam. No new focal infiltrate is seen. IMPRESSION: Tubes and lines as described above. Stable vascular congestion and pulmonary edema. Electronically Signed   By: Inez Catalina M.D.   On: 09/26/2018 07:15     Medications:     Scheduled Medications: . atorvastatin  40 mg Oral q1800  . chlorhexidine gluconate (MEDLINE KIT)  15 mL Mouth Rinse BID  . Chlorhexidine Gluconate Cloth  6 each Topical Daily  . feeding supplement (PRO-STAT SUGAR FREE 64)  30 mL Per Tube Daily  . feeding supplement (VITAL HIGH PROTEIN)  1,000 mL Per Tube Q24H  . gabapentin  100 mg Per Tube Q12H  . insulin aspart  0-20 Units Subcutaneous Q4H  . insulin detemir  65 Units Subcutaneous Daily  . mouth rinse  15 mL Mouth Rinse 10 times per day  . sodium chloride flush  3 mL Intravenous Q12H  . sodium chloride flush  3 mL Intravenous Q12H    Infusions: .  prismasol BGK 4/2.5 500 mL/hr at 09/27/18 1200  .  prismasol BGK 4/2.5 300 mL/hr at 09/27/18 1200  . sodium  chloride    . sodium chloride Stopped (09/27/18 0843)  . sodium chloride    . amiodarone Stopped (09/25/18 1338)  . famotidine (PEPCID) IV Stopped (09/26/18 1442)  . fentaNYL infusion INTRAVENOUS 10 mcg/hr (09/27/18 1400)  . heparin  1,200 Units/hr (09/27/18 1426)  . midazolam Stopped (09/25/18 0909)  . norepinephrine (LEVOPHED) Adult infusion 15 mcg/min (09/27/18 1400)  . prismasol BGK 4/2.5 1,000 mL/hr at 09/27/18 0844    PRN Medications: sodium chloride, sodium chloride, Place/Maintain arterial line **AND** sodium chloride, acetaminophen, fentaNYL, midazolam, midazolam, ondansetron (ZOFRAN) IV, sodium chloride flush, sodium chloride flush, sodium chloride flush   Assessment/Plan:   1. PEA arrest on 5/30 - due to hypoxia/pulmonary edema. - On CVVHD Continue to get volume off  2. Acute on chronic hypoxic respiratory failure - due to OHS/OSA  - intubated on 5/30 in setting of arrest. Appreciate CCM's assistance. Weaned down to 40% FiO2. CXR with worsening edema.  Would not extubate until more fluid off - sleep study 4/15 without OSA (AHI 1.4) but had nocturnal desat - likely need to repeat at some point   3.  Acute on chronic diastolic HF - She has markedly elevated filling pressures complicated by severe cardiorenal syndrome - Now improving with CVVHD with 25 pounds off but still markedly volume overloaded. Continue NE to help pull at 100-200/hr. (adjusted per Renal)  Appreciate Renal input. - Suspect she may progress to long-term HD  4. Pulmonary HTN, severe  - this is mostly pulmonary venous HTN by cath [facility] 2) but also has a component of OHS/OSA (WHO group 3) - no role for selective pulmonary vasodilators - will improve significantly with volume removal - consider repeat RHC after volume removal   5. AKI on CKD 3 - baseline creatinine 1.9. Peaked at 3.2 -> 3.0 - likely cardiorenal - now on CVVHD. Plan as above. Continue NE to support volume removal and sedation . - Suspect she may progress to long-term HD. Renal following   6. Morbid obesity - desperately needs weight loss  6. Chronic AF/AFL - rate is slow. Off apixaban. Now on heparin with CVVHD. No bleeding. Discussed dosing with PharmD  personally.  7. VT/NSVT - off amio due to bradycardia - Keep K> 4.0 Mg 2.0  8. Fever  - no localizing symptoms. WBC 6.9. - will get BCx  - follow fever curve  CRITICAL CARE Performed by: Glori Bickers  Total critical care time: 35 minutes  Critical care time was exclusive of separately billable procedures and treating other patients.  Critical care was necessary to treat or prevent imminent or life-threatening deterioration.  Critical care was time spent personally by me (independent of midlevel providers or residents) on the following activities: development of treatment plan with patient and/or surrogate as well as nursing, discussions with consultants, evaluation of patient's response to treatment, examination of patient, obtaining history from patient or surrogate, ordering and performing treatments and interventions, ordering and review of laboratory studies, ordering and review of radiographic studies, pulse oximetry and re-evaluation of patient's condition.  Length of Stay: 5  Glori Bickers MD 09/27/2018, 2:47 PM  Advanced Heart Failure Team Pager [phone] (M-F; 7a - 4p)  Please contact [facility] for night-coverage after hours (4p -7a ) and weekends on [email or web address]

## 2018-09-27 NOTE — Progress Notes (Signed)
Assisted tele visit to patient with family member.  Camile Esters Anderson, RN   

## 2018-09-27 NOTE — Progress Notes (Signed)
Subjective:  CRRT running without incident- overall removed another 4400- still on pressors - essentially no UOP - CVP 13- FIo2  Now at 40- slight fever overnight  Objective Vital signs in last 24 hours: Vitals:   09/27/18 0600 09/27/18 0700 09/27/18 0715 09/27/18 0730  BP: (!) 126/53 (!) 127/44 (!) 134/53 (!) 124/38  Pulse: 62 61 (!) 59 (!) 58  Resp: _0 Temp: 100 F (37.8 C) 99.9 F (37.7 C) 99.9 F (37.7 C) 99.7 F (37.6 C)  TempSrc:   Esophageal   SpO2: 100% 96% 97% 99%  Weight:       Weight change: -3.3 kg  Intake/Output Summary (Last 24 hours) at 09/27/2018 0745 Last data filed at 09/27/2018 0700 Gross per 24 hour  Intake 2137.17 ml  Output 6590 ml  Net -4452.83 ml    Assessment/ Plan: Pt is a 68 y.o. yo female HTN, Afib, DM, morbid obesity, diastolic heart failure and also baseline CKD- crt mid to high 1's who was admitted on 09/22/2018 with volume overload, then suffered cardiac arrest  Assessment/Plan: 1. Renal- A on CRF after cardiac arrest on 5/30.  Baseline crt in the mid to high 1's.   Required initiation of CRRT early on 5/30- overall negative fluid balance on CRRT- almost 9 liters.  Will back off on UF some - cap at 100 per hour 2. HTN/volume-  Aggressive volume removal at 200 per hour- tolerated well so far with elevated CVP and high o2 req- now parameters are better, will dec UF 3. Anemia- hgb over 11 4. Elytes-  K  OK for now - phos low, will replete    [name] Meckel    Labs: Basic Metabolic Panel: Recent Labs  Lab 09/26/18 0357 09/26/18 1552 09/27/18 0430 09/27/18 0436  NA 138 136 136 138  K 4.9 4.7 4.5 4.7  CL 104 102 102  --   CO2 _1 --   GLUCOSE 116* 169* 158*  --   BUN 32* 31* 29*  --   CREATININE 1.96* 1.83* 1.87*  --   CALCIUM 8.2* 8.4* 8.5*  --   PHOS 2.7 2.7 2.1*  --    Liver Function Tests: Recent Labs  Lab 09/22/18 1420  09/26/18 0357 09/26/18 1552 09/27/18 0430  AST 18  --   --   --   --   ALT 13  --    --   --   --   ALKPHOS 127*  --   --   --   --   BILITOT 1.2  --   --   --   --   PROT 7.2  --   --   --   --   ALBUMIN 3.2*   < > 2.7* 2.8* 2.7*   < > = values in this interval not displayed.   No results for input(s): LIPASE, AMYLASE in the last 168 hours. No results for input(s): AMMONIA in the last 168 hours. CBC: Recent Labs  Lab 09/22/18 1420  09/25/18 0402 09/27/18 0430 09/27/18 0436  WBC 5.7  --   --  6.9  --   NEUTROABS 3.9  --   --   --   --   HGB 10.9*   < > 11.9* 10.1* 11.9*  HCT 38.0   < > 35.0* 35.8* 35.0*  MCV 85.2  --   --  88.4  --   PLT 133*  --   --  150  --    < > =  values in this interval not displayed.   Cardiac Enzymes: No results for input(s): CKTOTAL, CKMB, CKMBINDEX, TROPONINI in the last 168 hours. CBG: Recent Labs  Lab 09/26/18 1223 09/26/18 1601 09/26/18 1933 09/26/18 2332 09/27/18 0438  GLUCAP 131* 144* 156* 142* 131*    Iron Studies: No results for input(s): IRON, TIBC, TRANSFERRIN, FERRITIN in the last 72 hours. Studies/Results: Dg Chest Port 1 View  Result Date: 09/26/2018 CLINICAL DATA:  Check endotracheal tube placement EXAM: PORTABLE CHEST 1 VIEW COMPARISON:  09/24/2018 FINDINGS: Endotracheal tube, gastric catheter and right jugular central lines are again seen and stable. Left-sided PICC line is noted at the confluence of the innominate veins. Cardiac shadow is mildly enlarged but accentuated by the frontal technique. Vascular congestion and edema is again identified stable in appearance from the prior exam. No new focal infiltrate is seen. IMPRESSION: Tubes and lines as described above. Stable vascular congestion and pulmonary edema. Electronically Signed   By: Inez Catalina M.D.   On: 09/26/2018 07:15   Medications: Infusions: .  prismasol BGK 4/2.5 500 mL/hr at 09/26/18 1637  .  prismasol BGK 4/2.5 300 mL/hr at 09/26/18 1632  . sodium chloride    . sodium chloride Stopped (09/26/18 1505)  . sodium chloride    . amiodarone Stopped  (09/25/18 1338)  . famotidine (PEPCID) IV Stopped (09/26/18 1442)  . fentaNYL infusion INTRAVENOUS 80 mcg/hr (09/27/18 0700)  . heparin 1,300 Units/hr (09/27/18 0700)  . midazolam Stopped (09/25/18 0909)  . norepinephrine (LEVOPHED) Adult infusion 20 mcg/min (09/27/18 0700)  . prismasol BGK 4/2.5 1,000 mL/hr at 09/27/18 0206    Scheduled Medications: . atorvastatin  40 mg Oral q1800  . chlorhexidine gluconate (MEDLINE KIT)  15 mL Mouth Rinse BID  . Chlorhexidine Gluconate Cloth  6 each Topical Daily  . feeding supplement (PRO-STAT SUGAR FREE 64)  30 mL Per Tube Daily  . feeding supplement (VITAL HIGH PROTEIN)  1,000 mL Per Tube Q24H  . gabapentin  100 mg Per Tube Q12H  . insulin aspart  0-20 Units Subcutaneous Q4H  . insulin detemir  65 Units Subcutaneous Daily  . mouth rinse  15 mL Mouth Rinse 10 times per day  . sodium chloride flush  3 mL Intravenous Q12H  . sodium chloride flush  3 mL Intravenous Q12H    have reviewed scheduled and prn medications.  Physical Exam: General: obese, sedated on vent Heart: brady  Lungs: CBS bilat Abdomen: obese, non tender Extremities: significant pitting edema Dialysis Access: right IJ vascath placed 5/30  - 3 days    09/27/2018,7:45 AM  LOS: 5 days

## 2018-09-28 ENCOUNTER — Inpatient Hospital Stay (HOSPITAL_COMMUNITY): Payer: Medicare HMO

## 2018-09-28 LAB — GLUCOSE, CAPILLARY
Glucose-Capillary: 126 mg/dL — ABNORMAL HIGH (ref 70–99)
Glucose-Capillary: 146 mg/dL — ABNORMAL HIGH (ref 70–99)
Glucose-Capillary: 161 mg/dL — ABNORMAL HIGH (ref 70–99)

## 2018-09-28 LAB — RENAL FUNCTION PANEL
Albumin: 2.5 g/dL — ABNORMAL LOW (ref 3.5–5.0)
Albumin: 2.5 g/dL — ABNORMAL LOW (ref 3.5–5.0)
Anion gap: 9 (ref 5–15)
Anion gap: 8 (ref 5–15)
BUN: 29 mg/dL — ABNORMAL HIGH (ref 8–23)
BUN: 27 mg/dL — ABNORMAL HIGH (ref 8–23)
CO2: 26 mmol/L (ref 22–32)
CO2: 27 mmol/L (ref 22–32)
Calcium: 8.6 mg/dL — ABNORMAL LOW (ref 8.9–10.3)
Calcium: 8.5 mg/dL — ABNORMAL LOW (ref 8.9–10.3)
Chloride: 101 mmol/L (ref 98–111)
Chloride: 102 mmol/L (ref 98–111)
Creatinine, Ser: 1.93 mg/dL — ABNORMAL HIGH (ref 0.44–1.00)
Creatinine, Ser: 1.78 mg/dL — ABNORMAL HIGH (ref 0.44–1.00)
GFR calc Af Amer: 31 mL/min — ABNORMAL LOW (ref >60)
GFR calc Af Amer: 34 mL/min — ABNORMAL LOW (ref >60)
GFR calc non Af Amer: 26 mL/min — ABNORMAL LOW (ref >60)
GFR calc non Af Amer: 29 mL/min — ABNORMAL LOW (ref >60)
Glucose, Bld: 180 mg/dL — ABNORMAL HIGH (ref 70–99)
Glucose, Bld: 124 mg/dL — ABNORMAL HIGH (ref 70–99)
Phosphorus: 2.5 mg/dL (ref 2.5–4.6)
Phosphorus: 2 mg/dL — ABNORMAL LOW (ref 2.5–4.6)
Potassium: 4.4 mmol/L (ref 3.5–5.1)
Potassium: 4.3 mmol/L (ref 3.5–5.1)
Sodium: 136 mmol/L (ref 135–145)
Sodium: 137 mmol/L (ref 135–145)

## 2018-09-28 LAB — HEPARIN LEVEL (UNFRACTIONATED): Heparin Unfractionated: 1.96 IU/mL — ABNORMAL HIGH (ref 0.30–0.70)

## 2018-09-28 LAB — APTT
aPTT: 121 seconds — ABNORMAL HIGH (ref 24–36)
aPTT: 137 seconds — ABNORMAL HIGH (ref 24–36)
aPTT: 70 seconds — ABNORMAL HIGH (ref 24–36)
aPTT: 133 seconds — ABNORMAL HIGH (ref 24–36)

## 2018-09-28 MED ORDER — ALTEPLASE 2 MG IJ SOLR
2.0000 mg | Freq: Once | INTRAMUSCULAR | Status: AC
  Administered 2018-09-28: 2 mg

## 2018-09-28 MED ORDER — FENTANYL CITRATE (PF) 100 MCG/2ML IJ SOLN
25.0000 ug | INTRAMUSCULAR | Status: DC | PRN
  Administered 2018-09-29: 25 ug via INTRAVENOUS
  Administered 2018-09-30 – 2018-10-04 (×2): 100 ug via INTRAVENOUS
  Administered 2018-10-04: 75 ug via INTRAVENOUS
  Administered 2018-10-06: 06:00:00 100 ug via INTRAVENOUS
  Filled 2018-09-28 (×6): qty 2

## 2018-09-28 MED ORDER — VANCOMYCIN HCL 10 G IV SOLR
2000.0000 mg | Freq: Once | INTRAVENOUS | Status: AC
  Administered 2018-09-28: 2000 mg via INTRAVENOUS
  Filled 2018-09-28: qty 2000

## 2018-09-28 MED ORDER — VANCOMYCIN HCL 10 G IV SOLR
1250.0000 mg | INTRAVENOUS | Status: DC
  Filled 2018-09-28: qty 1250

## 2018-09-28 NOTE — Progress Notes (Signed)
[place] for Heparin Indication: atrial fibrillation  Allergies  Allergen Reactions  . Doxycycline     REACTION: Wheals and pruritus    Patient Measurements: Weight: 266 lb 8.6 oz (120.9 kg)  Vital Signs: Temp: 98.4 F (36.9 C) (06/03 1136) Temp Source: Oral (06/03 1136) BP: 114/40 (06/03 1100) Pulse Rate: 55 (06/03 1121)  Labs: Recent Labs    09/26/18 0357  09/27/18 0430 09/27/18 0436  09/27/18 1533 09/27/18 2219 09/28/18 0416 09/28/18 0830 09/28/18 1029  HGB  --   --  10.1* 11.9*  --   --   --   --   --   --   HCT  --   --  35.8* 35.0*  --   --   --   --   --   --   PLT  --   --  150  --   --   --   --   --   --   --   APTT  --    < > 103*  --    < >  --  121*  --  137* 70*  HEPARINUNFRC >2.20*  --  2.06*  --   --   --   --   --  1.96*  --   CREATININE 1.96*   < > 1.87*  --   --  1.91*  --  1.93*  --   --    < > = values in this interval not displayed.    Estimated Creatinine Clearance: 34.3 mL/min (A) (by C-G formula based on SCr of 1.93 mg/dL (H)).  Assessment: Pt is a 67yoF with a history of afib. She was on Eliquis PTA, received in hospital, last dose 5/29. Apixaban on hold for potential need for [facility], currently on CRRT. Pharmacy consulted to start heparin. Eliquis interferes with HL monitoring, will monitor aPTT until correlating.  APTT returned supratherapeutic at 121 on heparin 1050 units/hr. Nurse reports it was drawn from HD cath, flushes and discarded initial 10cc.  Rechecked with phlebotomist stick, returned therapeutic at 7. HL remains artificially elevated due to recent Eliquis use.    Goal of Therapy:  Heparin level 0.3-0.7 units/ml  APTT 66-102 Monitor platelets by anticoagulation protocol: Yes    Plan:  Continue heparin 1050 units/hr peripherally  Confirm aPTT in 8 hours Check aPTT/HL daily while on heparin   Claiborne Billings, PharmD PGY2 Cardiology Pharmacy Resident Please check AMION for all  Pharmacist numbers by unit 09/28/2018 11:42 AM

## 2018-09-28 NOTE — Progress Notes (Signed)
Pt sat was 88-89% on 6 lpm [place].  HFNC started at 8 lpm.  Dr Elsworth Soho in room and aware.  Sat goal 90% or above per MD.  [facility] w/ crackles, RN aware.

## 2018-09-28 NOTE — Progress Notes (Signed)
Subjective:  CRRT running without incident- overall removed another 2700- still on pressors - essentially no UOP - CVP variable 14 to 22 - FIo2  Now at 40- now hypothermic  Objective Vital signs in last 24 hours: Vitals:   09/28/18 0500 09/28/18 0600 09/28/18 0700 09/28/18 0758  BP: (!) 128/55 (!) 112/47 (!) 119/59 (!) 123/58  Pulse: (!) 58 (!) 55 (!) 55   Resp: (!) 26 19 (!) 22 (!) 24  Temp: 98.4 F (36.9 C) 98.4 F (36.9 C) 98.6 F (37 C)   TempSrc:   Esophageal   SpO2: 94% 95% 96%   Weight:       Weight change: -3.4 kg  Intake/Output Summary (Last 24 hours) at 09/28/2018 0801 Last data filed at 09/28/2018 0700 Gross per 24 hour  Intake 2141.28 ml  Output 4691 ml  Net -2549.72 ml    Assessment/ Plan: Pt is a 68 y.o. yo female HTN, Afib, DM, morbid obesity, diastolic heart failure and also baseline CKD- crt mid to high 1's who was admitted on 09/22/2018 with volume overload, then suffered cardiac arrest  Assessment/Plan: 1. Renal- A on CRF after cardiac arrest on 5/30.  Baseline crt in the mid to high 1's.   Required initiation of CRRT early on 5/30- overall negative fluid balance on CRRT- almost 11 liters.  Will cont UF - cap at 100 per hour 2. HTN/volume-  tolerated well so far with elevated CVP and high o2 req- parameters are better, will dec UF 3. Anemia- hgb over 11 4. Elytes-  K  OK for now - phos better    [name] Meckel    Labs: Basic Metabolic Panel: Recent Labs  Lab 09/27/18 0430 09/27/18 0436 09/27/18 1533 09/28/18 0416  NA 136 138 136 136  K 4.5 4.7 4.1 4.4  CL 102  --  102 101  CO2 25  --  27 26  GLUCOSE 158*  --  201* 180*  BUN 29*  --  32* 29*  CREATININE 1.87*  --  1.91* 1.93*  CALCIUM 8.5*  --  8.3* 8.6*  PHOS 2.1*  --  3.0 2.5   Liver Function Tests: Recent Labs  Lab 09/22/18 1420  09/27/18 0430 09/27/18 1533 09/28/18 0416  AST 18  --   --   --   --   ALT 13  --   --   --   --   ALKPHOS 127*  --   --   --   --   BILITOT 1.2  --    --   --   --   PROT 7.2  --   --   --   --   ALBUMIN 3.2*   < > 2.7* 2.5* 2.5*   < > = values in this interval not displayed.   No results for input(s): LIPASE, AMYLASE in the last 168 hours. No results for input(s): AMMONIA in the last 168 hours. CBC: Recent Labs  Lab 09/22/18 1420  09/25/18 0402 09/27/18 0430 09/27/18 0436  WBC 5.7  --   --  6.9  --   NEUTROABS 3.9  --   --   --   --   HGB 10.9*   < > 11.9* 10.1* 11.9*  HCT 38.0   < > 35.0* 35.8* 35.0*  MCV 85.2  --   --  88.4  --   PLT 133*  --   --  150  --    < > = values in  this interval not displayed.   Cardiac Enzymes: No results for input(s): CKTOTAL, CKMB, CKMBINDEX, TROPONINI in the last 168 hours. CBG: Recent Labs  Lab 09/27/18 1207 09/27/18 1505 09/27/18 2008 09/28/18 0007 09/28/18 0430  GLUCAP 179* 185* 154* 146* 161*    Iron Studies: No results for input(s): IRON, TIBC, TRANSFERRIN, FERRITIN in the last 72 hours. Studies/Results: Dg Chest Port 1 View  Result Date: 09/27/2018 CLINICAL DATA:  Check endotracheal tube placement EXAM: PORTABLE CHEST 1 VIEW COMPARISON:  09/26/2018 FINDINGS: Cardiac shadow remains enlarged. Endotracheal tube, gastric catheter and right jugular central line are noted in satisfactory position. Left-sided PICC line is noted as well. Diffuse vascular congestion is again seen with some increase in the degree of pulmonary edema when compare with the prior exam. No bony abnormality is noted. IMPRESSION: Increase in the degree of pulmonary edema. Electronically Signed   By: Inez Catalina M.D.   On: 09/27/2018 07:45   Medications: Infusions: .  prismasol BGK 4/2.5 500 mL/hr at 09/27/18 2342  .  prismasol BGK 4/2.5 300 mL/hr at 09/28/18 0245  . sodium chloride    . sodium chloride 10 mL/hr at 09/28/18 0700  . sodium chloride    . amiodarone Stopped (09/25/18 1338)  . famotidine (PEPCID) IV Stopped (09/27/18 1625)  . fentaNYL infusion INTRAVENOUS 100 mcg/hr (09/28/18 0700)  . heparin  1,050 Units/hr (09/28/18 0700)  . midazolam Stopped (09/25/18 0909)  . norepinephrine (LEVOPHED) Adult infusion 15 mcg/min (09/28/18 0700)  . prismasol BGK 4/2.5 1,000 mL/hr at 09/28/18 0440    Scheduled Medications: . atorvastatin  40 mg Oral q1800  . chlorhexidine gluconate (MEDLINE KIT)  15 mL Mouth Rinse BID  . Chlorhexidine Gluconate Cloth  6 each Topical Daily  . feeding supplement (PRO-STAT SUGAR FREE 64)  30 mL Per Tube Daily  . feeding supplement (VITAL HIGH PROTEIN)  1,000 mL Per Tube Q24H  . gabapentin  100 mg Per Tube Q12H  . insulin aspart  0-20 Units Subcutaneous Q4H  . insulin detemir  65 Units Subcutaneous Daily  . mouth rinse  15 mL Mouth Rinse 10 times per day  . sodium chloride flush  3 mL Intravenous Q12H  . sodium chloride flush  3 mL Intravenous Q12H    have reviewed scheduled and prn medications.  Physical Exam: General: obese, sedated on vent Heart: brady  Lungs: CBS bilat Abdomen: obese, non tender Extremities: significant pitting edema Dialysis Access: right IJ vascath placed 5/30     09/28/2018,8:01 AM  LOS: 6 days

## 2018-09-28 NOTE — Progress Notes (Signed)
Pt has refused Incentive Spirometry x5 since beginning of 1900 shift. Pt educated on why IS is important & possible effects of not completing IS regularly.  Will continue to monitor.

## 2018-09-28 NOTE — Progress Notes (Signed)
[place] for Heparin Indication: atrial fibrillation  Allergies  Allergen Reactions  . Doxycycline     REACTION: Wheals and pruritus    Patient Measurements: Weight: 266 lb 8.6 oz (120.9 kg)  Vital Signs: Temp: 97.7 F (36.5 C) (06/03 2000) Temp Source: Oral (06/03 2000) BP: 110/46 (06/03 2015) Pulse Rate: 53 (06/03 2015)  Labs: Recent Labs    09/26/18 0357  09/27/18 0430 09/27/18 0436  09/27/18 1533  09/28/18 0416 09/28/18 0830 09/28/18 1029 09/28/18 1600 09/28/18 1855  HGB  --   --  10.1* 11.9*  --   --   --   --   --   --   --   --   HCT  --   --  35.8* 35.0*  --   --   --   --   --   --   --   --   PLT  --   --  150  --   --   --   --   --   --   --   --   --   APTT  --    < > 103*  --    < >  --    < >  --  137* 70*  --  133*  HEPARINUNFRC >2.20*  --  2.06*  --   --   --   --   --  1.96*  --   --   --   CREATININE 1.96*   < > 1.87*  --   --  1.91*  --  1.93*  --   --  1.78*  --    < > = values in this interval not displayed.    Estimated Creatinine Clearance: 37.2 mL/min (A) (by C-G formula based on SCr of 1.78 mg/dL (H)).  Assessment: Pt is a 67yoF with a history of afib. She was on Eliquis PTA, received in hospital, last dose 5/29. Apixaban on hold for potential need for [facility], currently on CRRT. Pharmacy consulted to start heparin. Eliquis interferes with HL monitoring, will monitor aPTT until correlating.  APTT returned supratherapeutic at 133 on heparin 1050 units/hr. Now post shift change, night nurse unsure where lab was drawn but assumed through picc. Heparin running through peripheral so not sure why we would have issues with labs from central line. Will drop heparin down slightly and recheck with am labs. AM labs will be done via picc, heparin to remain on peripheral.   Goal of Therapy:  Heparin level 0.3-0.7 units/ml  APTT 66-102 Monitor platelets by anticoagulation protocol: Yes    Plan:  Reduce heparin to  900 units/hr peripherally  APTT and heparin level in 8 hours  [name] PharmD., BCPS Clinical Pharmacist 09/28/2018 8:39 PM

## 2018-09-28 NOTE — Progress Notes (Signed)
NAME:  Kathleen [name], MRN:  [id number], DOB:  05-30-1950, LOS: 6 ADMISSION DATE:  09/22/2018, CONSULTATION DATE: 09/24/2018 REFERRING MD: Candee Furbish MD, CHIEF COMPLAINT: Cardiac arrest  Brief History   68 yo obese woman with chronic diastolic heart failure who failed outpatient diuretics management due to chronic kidney disease.  Right heart cath on 5/29 showed markedly elevated biventricular filling pressures with normal cardiac output.  She was on cardiology service, maintained on milrinone and Lasix.  On the morning of 5/30 she went into PEA arrest, CPR performed for 8 minutes.  Intubated and transferred to ICU.  Started on CRRT 5/31.  Past Medical History   has a past medical history of Atrial flutter ([place]), Bradycardia, Chronic diastolic (congestive) heart failure ([place]) (01/10/2015), CKD (chronic kidney disease), stage IV ([place]), Degenerative joint disease of hand, Diabetes mellitus, Dyslipidemia, Fecal occult blood test positive, GERD (gastroesophageal reflux disease), Headache, Hypertension, Inadequate material resources, Irritable bowel syndrome, Morbid obesity ([place]), Obesity hypoventilation syndrome ([place]), Post-menopausal bleeding, and Shortness of breath dyspnea.  Significant Hospital Events   5/28 Admit 5/29 RHC 5/30 PEA arrest, intubated/ CRRT 5/31  Milrinone stopped due to ectopy; brady episode- amio stopped 6/1- 6/2 attempt at SBT, chest x-ray with worsening pulmonary edema, CVVHD for volume removal  Consults:  Cardiology PCCM  Nephrology   Procedures:  Lt PICC 5/29 >> OETT 5/30 >> R IJ HD cath 5/30 >>  Aline  left ulnar5/30 >> 6/2  Significant Diagnostic Tests:  TTE 5/28 >> 1. The left ventricle has normal systolic function with an ejection fraction of 60-65%. The cavity size was normal. Left ventricular diastolic Doppler parameters are indeterminate. No evidence of left ventricular regional wall motion abnormalities.  2. The right ventricle has normal systolic function.  The cavity was mildly enlarged. There is no increase in right ventricular wall thickness.  3. Right atrial size was mildly dilated.  4. There is mild mitral annular calcification present.  5. The aortic valve is tricuspid. Mild thickening of the aortic valve. Mild calcification of the aortic valve. Aortic valve regurgitation is trivial by color flow Doppler.  6. The aortic root is normal in size and structure.  Right heart cath 5/29 RA = 24 RV = 94/26 PA = 95/36 (53) PCW = 30 (v = 50) Fick cardiac output/index = 6.0/2.7 PVR =3.9 WU FA sat = 91% PA sat = 58%, 58% SVC 60%  Micro Data:  5/28 SARS coronavirus 2 cepheid >> neg 5/28 MRSA PCR >> neg  Antimicrobials:   Interim history/subjective:  Afebrile Remains critically ill, on levo fed, intubated, on CRRT Good negative balance about 15 L  Objective   Blood pressure 118/62, pulse 60, temperature 98.2 F (36.8 C), resp. rate 18, weight 120.9 kg, SpO2 96 %. CVP:  [14 mmHg-19 mmHg] 18 mmHg  Vent Mode: PRVC FiO2 (%):  [40 %] 40 % Set Rate:  [20 bmp] 20 bmp Vt Set:  [360 mL-380 mL] 380 mL PEEP:  [8 cmH20] 8 cmH20 Plateau Pressure:  [19 cmH20-24 cmH20] 24 cmH20   Intake/Output Summary (Last 24 hours) at 09/28/2018 0935 Last data filed at 09/28/2018 0900 Gross per 24 hour  Intake 2123.31 ml  Output 4875 ml  Net -2751.69 ml   Filed Weights   09/26/18 0429 09/27/18 0110 09/28/18 0100  Weight: 127.6 kg 124.3 kg 120.9 kg    Examination: General: Critically ill morbidly obese female resting in bed on mechanical life support HEENT: Mucous membranes moist, pupils reactive, ET tube in place  Neuro: Awake, follows commands, grossly nonfocal, RA SS 0 to +1 CV: Regular rate and rhythm, no MRG, S1-S2 PULM: Decreased breath sounds bilateral GI: Soft nontender nondistended bowel sounds present morbidly obese pannus Extremities: No significant lower extremity edema Skin: No rash  Chest x-ray 6/3 with was personally reviewed which shows  decreased pulmonary edema pattern  Resolved Hospital Problem list    Assessment & Plan:   Cardiac arrest, PEA- Suspect hypoxemic driven. Acute hypoxemic respiratory failure requiring intubation mechanical ventilation secondary to pulmonary edema  Drop PEEP to 5 Spontaneous breathing trials with goal extubation if tolerates pressure support 5/5 Sleep study in 07/2013 only showed nocturnal hypoxia, will need repeat as outpatient  Acute on chronic diastolic heart failure Severe pulmonary hypertension status post right heart catheterization - WHO 2/3 Chronic A. fib Telemetry  Continue levophed to maintain mean arterial pressure greater than 65 Heparin drip for anticoagulation, off apixaban  VT/ NSVT -off amnio due to bradycardia  Acute renal failure AKI on chronic kidney disease Likely cardiorenal syndrome CVVHD management per nephrology, continue negative balance 100/hour  Diabetes mellitus type II Morbid obesity SSI with CBGs plus Levemir  Best practice:  Diet: NPO,  TF  Pain/Anxiety/Delirium protocol (if indicated): PRN Versed/ fentanyl  VAP protocol (if indicated): Yes DVT prophylaxis: heparin gtt GI prophylaxis: Pepcid Glucose control: SSI, Levemir Mobility: Bed Code Status: Full Family Communication: Per primary Disposition: ICU   Summary-significant negative balance has been achieved with CRRT and she is getting closer to extubation  The patient is critically ill with multiple organ systems failure and requires high complexity decision making for assessment and support, frequent evaluation and titration of therapies, application of advanced monitoring technologies and extensive interpretation of multiple databases. Critical Care Time devoted to patient care services described in this note independent of APP/resident  time is 32 minutes.   Kara Mead MD. [place] Flood. [facility] Pager [phone] If no response call [phone]   09/28/2018

## 2018-09-28 NOTE — Significant Event (Signed)
Wasted approximately 70cc of fentanyl in narcotic waste bin with another RN Sarah.    Kathleen [name]

## 2018-09-28 NOTE — Progress Notes (Signed)
Patient refused to wear the BiPAP tonight. Stated she did wear CPAP at home but she was not wearing it here. Informed patient if she changed her mind to have the RN call RT. RN made aware.

## 2018-09-28 NOTE — Progress Notes (Signed)
.  Assisted tele visit to patient with son.  Cleotis Nipper, RN

## 2018-09-28 NOTE — Significant Event (Signed)
Both ports of PICC unable to draw back blood; able to be flush. CVP via PICC is varying from 14-22.    New CVP system set up via pigtail of HD shows significantly lower at 6-8.    Kathleen [name]

## 2018-09-28 NOTE — Significant Event (Signed)
Wasted approximately 90cc of versed from the bag with another RN [employer].    Kathleen [name]

## 2018-09-28 NOTE — Procedures (Signed)
Extubation Procedure Note  Patient Details:   Name: Kathleen [name] DOB: 12-10-1950 MRN: [id number]   Airway Documentation:    Vent end date: 09/28/18 Vent end time: 1102   Evaluation  O2 sats: stable throughout Complications: No apparent complications Patient did tolerate procedure well. Bilateral Breath Sounds: Rhonchi   Yes   Pt tolerated extubation well.  No stridor or dyspnea noted.  Attempted IS with patient but patient is unable to perform maneuver at this time. RN and RT will continue to follow patient for IS.  Earney Navy 09/28/2018, 11:05 AM

## 2018-09-28 NOTE — Progress Notes (Signed)
Pharmacy Antibiotic Note  Kathleen [name] is a 68 y.o. female admitted on 09/22/2018 with moderate S aureus in TA.  Pharmacy has been consulted for vancomycin dosing. Patient currently on CRRT. WBC 6.02.   Plan: Vancomycin 2000 mg IV x1 then 1250 mg IV daily Monitor clinical status, CRRT plans, cultures, and length of therapy Monitor vancomycin levels as needed  Weight: 266 lb 8.6 oz (120.9 kg)  Temp (24hrs), Avg:98.1 F (36.7 C), Min:96.8 F (36 C), Max:99 F (37.2 C)  Recent Labs  Lab 09/22/18 1420  09/26/18 0357 09/26/18 1552 09/27/18 0430 09/27/18 1533 09/28/18 0416  WBC 5.7  --   --   --  6.9  --   --   CREATININE 2.84*   < > 1.96* 1.83* 1.87* 1.91* 1.93*   < > = values in this interval not displayed.    Estimated Creatinine Clearance: 34.3 mL/min (A) (by C-G formula based on SCr of 1.93 mg/dL (H)).    Allergies  Allergen Reactions  . Doxycycline     REACTION: Wheals and pruritus    Antimicrobials this admission: Vancomycin 6/3 >>  Dose adjustments this admission:  Microbiology results: 6/3 TA: mod S aureus  6/3 BCx: NG< 24 hr  5/28 mrsa: negative   Thank you for allowing pharmacy to be a part of this patient's care.  Claiborne Billings, PharmD PGY2 Cardiology Pharmacy Resident Please check AMION for all Pharmacist numbers by unit 09/28/2018 3:28 PM

## 2018-09-28 NOTE — Progress Notes (Signed)
[facility] for Heparin Indication: atrial fibrillation  Allergies  Allergen Reactions  . Doxycycline     REACTION: Wheals and pruritus    Patient Measurements: Weight: 274 lb 0.5 oz (124.3 kg)  Vital Signs: Temp: 97.7 F (36.5 C) (06/03 0000) Temp Source: Esophageal (06/02 2000) BP: 115/56 (06/03 0000) Pulse Rate: 51 (06/03 0000)  Labs: Recent Labs    09/25/18 0402  09/26/18 0357  09/26/18 1552 09/27/18 0430 09/27/18 0436 09/27/18 1256 09/27/18 1533 09/27/18 2219  HGB 11.9*  --   --   --   --  10.1* 11.9*  --   --   --   HCT 35.0*  --   --   --   --  35.8* 35.0*  --   --   --   PLT  --   --   --   --   --  150  --   --   --   --   APTT  --    < >  --    < >  --  103*  --  114*  --  121*  HEPARINUNFRC  --   --  >2.20*  --   --  2.06*  --   --   --   --   CREATININE  --    < > 1.96*  --  1.83* 1.87*  --   --  1.91*  --    < > = values in this interval not displayed.    Estimated Creatinine Clearance: 35.2 mL/min (A) (by C-G formula based on SCr of 1.91 mg/dL (H)).  Assessment: Pt is a 67yoF with a history of afib. She was on Eliquis PTA, received in hospital, last dose 5/29. Apixaban on hold for potential need for [facility], currently on CRRT. Pharmacy consulted to start heparin. Eliquis interferes with HL monitoring, will monitor aPTT until correlating.  APTT 121 sec Goal of Therapy:  Heparin level 0.3-0.7 units/ml  APTT 66-102 Monitor platelets by anticoagulation protocol: Yes   Plan:  Decrease heparin 1050 units/hr peripherally  Check aPTT/HL daily while on heparin   Thanks for allowing pharmacy to be a part of this patient's care.  Excell Seltzer, PharmD Clinical Pharmacist

## 2018-09-28 NOTE — Progress Notes (Signed)
Advanced Heart Failure Rounding Note   Subjective:    Remains on vent and CRRT. Awake. Follows commands   Pulling 100/hr. Weight down 8 pounds overnight. (33 pounds total)  Tmax 100.1 CVP 9  Off amio due to bradycardia. Rhythm stable   Objective:   Weight Range:  Vital Signs:   Temp:  [96.8 F (36 C)-100.1 F (37.8 C)] 98.2 F (36.8 C) (06/03 0830) Pulse Rate:  [51-62] 57 (06/03 0830) Resp:  [11-29] 14 (06/03 0830) BP: (100-128)/(37-67) 120/50 (06/03 0830) SpO2:  [90 %-100 %] 98 % (06/03 0830) FiO2 (%):  [40 %] 40 % (06/03 0830) Weight:  [120.9 kg] 120.9 kg (06/03 0100) Last BM Date: 09/22/18  Weight change: Filed Weights   09/26/18 0429 09/27/18 0110 09/28/18 0100  Weight: 127.6 kg 124.3 kg 120.9 kg    Intake/Output:   Intake/Output Summary (Last 24 hours) at 09/28/2018 0847 Last data filed at 09/28/2018 0800 Gross per 24 hour  Intake 2220.48 ml  Output 4877 ml  Net -2656.52 ml     Physical Exam: General:  Intubated awake follows commands HEENT: normal +ETT Neck: supple. Unable to see JVD. RIJ trialysis  Carotids 2+ bilat; no bruits. No lymphadenopathy or thryomegaly appreciated. Cor: PMI nondisplaced. Irregular rate & rhythm. Lungs: coarse Abdomen: obese soft, nontender, nondistended. No hepatosplenomegaly. No bruits or masses. Good bowel sounds. Extremities: no cyanosis, clubbing, rash, 1+ edema Neuro: intubated awake and follows commands    Telemetry: Sinus brady vs AFL 50-60s with PVCs/NSVT/bigeminy. Personally reviewed   Labs: Basic Metabolic Panel: Recent Labs  Lab 09/25/18 0400  09/26/18 0357 09/26/18 1552 09/27/18 0430 09/27/18 0436 09/27/18 1516 09/27/18 1533 09/28/18 0416  NA 139   < > 138 136 136 138  --  136 136  K 3.8   < > 4.9 4.7 4.5 4.7  --  4.1 4.4  CL 101   < > 104 102 102  --   --  102 101  CO2 26   < > 25 26 25   --   --  27 26  GLUCOSE 124*   < > 116* 169* 158*  --   --  201* 180*  BUN 54*   < > 32* 31* 29*  --   --   32* 29*  CREATININE 2.41*   < > 1.96* 1.83* 1.87*  --   --  1.91* 1.93*  CALCIUM 8.3*   < > 8.2* 8.4* 8.5*  --   --  8.3* 8.6*  MG 2.3  --  2.3 2.5* 2.4  --  2.4  --   --   PHOS 3.1   < > 2.7 2.7 2.1*  --   --  3.0 2.5   < > = values in this interval not displayed.    Liver Function Tests: Recent Labs  Lab 09/22/18 1420  09/26/18 0357 09/26/18 1552 09/27/18 0430 09/27/18 1533 09/28/18 0416  AST 18  --   --   --   --   --   --   ALT 13  --   --   --   --   --   --   ALKPHOS 127*  --   --   --   --   --   --   BILITOT 1.2  --   --   --   --   --   --   PROT 7.2  --   --   --   --   --   --  ALBUMIN 3.2*   < > 2.7* 2.8* 2.7* 2.5* 2.5*   < > = values in this interval not displayed.   No results for input(s): LIPASE, AMYLASE in the last 168 hours. No results for input(s): AMMONIA in the last 168 hours.  CBC: Recent Labs  Lab 09/22/18 1420  09/24/18 1320 09/24/18 1551 09/25/18 0402 09/27/18 0430 09/27/18 0436  WBC 5.7  --   --   --   --  6.9  --   NEUTROABS 3.9  --   --   --   --   --   --   HGB 10.9*   < > 11.2* 11.2* 11.9* 10.1* 11.9*  HCT 38.0   < > 33.0* 33.0* 35.0* 35.8* 35.0*  MCV 85.2  --   --   --   --  88.4  --   PLT 133*  --   --   --   --  150  --    < > = values in this interval not displayed.    Cardiac Enzymes: No results for input(s): CKTOTAL, CKMB, CKMBINDEX, TROPONINI in the last 168 hours.  BNP: BNP (last 3 results) Recent Labs    09/22/18 1420  BNP 451.2*    ProBNP (last 3 results) No results for input(s): PROBNP in the last 8760 hours.    Other results:  Imaging: Dg Chest Port 1 View  Result Date: 09/28/2018 CLINICAL DATA:  Respiratory failure EXAM: PORTABLE CHEST 1 VIEW COMPARISON:  09/27/2018 FINDINGS: Endotracheal tube, gastric catheter and right jugular central line are again seen. Left-sided PICC line is noted at the junction of the innominate veins. Persistent but improved vascular congestion is noted. No new focal infiltrate is  seen. IMPRESSION: Improvement in the degree of pulmonary edema. No new focal abnormality is seen. Electronically Signed   By: Inez Catalina M.D.   On: 09/28/2018 08:14   Dg Chest Port 1 View  Result Date: 09/27/2018 CLINICAL DATA:  Check endotracheal tube placement EXAM: PORTABLE CHEST 1 VIEW COMPARISON:  09/26/2018 FINDINGS: Cardiac shadow remains enlarged. Endotracheal tube, gastric catheter and right jugular central line are noted in satisfactory position. Left-sided PICC line is noted as well. Diffuse vascular congestion is again seen with some increase in the degree of pulmonary edema when compare with the prior exam. No bony abnormality is noted. IMPRESSION: Increase in the degree of pulmonary edema. Electronically Signed   By: Inez Catalina M.D.   On: 09/27/2018 07:45     Medications:     Scheduled Medications: . atorvastatin  40 mg Oral q1800  . chlorhexidine gluconate (MEDLINE KIT)  15 mL Mouth Rinse BID  . Chlorhexidine Gluconate Cloth  6 each Topical Daily  . feeding supplement (PRO-STAT SUGAR FREE 64)  30 mL Per Tube Daily  . feeding supplement (VITAL HIGH PROTEIN)  1,000 mL Per Tube Q24H  . gabapentin  100 mg Per Tube Q12H  . insulin aspart  0-20 Units Subcutaneous Q4H  . insulin detemir  65 Units Subcutaneous Daily  . mouth rinse  15 mL Mouth Rinse 10 times per day  . sodium chloride flush  3 mL Intravenous Q12H  . sodium chloride flush  3 mL Intravenous Q12H    Infusions: .  prismasol BGK 4/2.5 500 mL/hr at 09/27/18 2342  .  prismasol BGK 4/2.5 300 mL/hr at 09/28/18 0245  . sodium chloride    . sodium chloride 10 mL/hr at 09/28/18 0800  . sodium chloride    . amiodarone  Stopped (09/25/18 1338)  . famotidine (PEPCID) IV Stopped (09/27/18 1625)  . fentaNYL infusion INTRAVENOUS Stopped (09/28/18 0732)  . heparin 1,050 Units/hr (09/28/18 0800)  . midazolam Stopped (09/25/18 0909)  . norepinephrine (LEVOPHED) Adult infusion 14 mcg/min (09/28/18 0800)  . prismasol BGK 4/2.5  1,000 mL/hr at 09/28/18 0440    PRN Medications: sodium chloride, sodium chloride, Place/Maintain arterial line **AND** sodium chloride, acetaminophen, fentaNYL, midazolam, midazolam, ondansetron (ZOFRAN) IV, sodium chloride flush, sodium chloride flush, sodium chloride flush   Assessment/Plan:   1. PEA arrest on 5/30 - due to hypoxia/pulmonary edema. - On CVVHD Continue to get volume off  2. Acute on chronic hypoxic respiratory failure - due to OHS/OSA  - intubated on 5/30 in setting of arrest. Appreciate CCM's assistance. Weaned down to 40% FiO2. CXR with improving edema.  Possibly extubate today. D/w CCM at bedside - sleep study 4/15 without OSA (AHI 1.4) but had nocturnal desat - likely need to repeat at some point   3.  Acute on chronic diastolic HF - She has markedly elevated filling pressures complicated by severe cardiorenal syndrome - Now improving with CVVHD with 33 pounds off. CVP 9. Likely needs one more day of removing.  Continue NE to help pull at 100/hr. (adjusted per Renal)  Appreciate Renal input. - Suspect she may progress to long-term HD  4. Pulmonary HTN, severe  - this is mostly pulmonary venous HTN by cath [facility] 2) but also has a component of OHS/OSA (WHO group 3) - no role for selective pulmonary vasodilators - will improve significantly with volume removal - consider repeat RHC after volume removal   5. AKI on CKD 3 - baseline creatinine 1.9. Peaked at 3.2 -> 3.0 - likely cardiorenal - now on CVVHD. Plan as above. Continue NE to support volume removal and sedation . - Suspect she may progress to long-term HD. Renal following   6. Morbid obesity - desperately needs weight loss  6. Chronic AF/AFL - rate is slow. Off apixaban. Now on heparin with CVVHD. No bleeding. Discussed dosing with PharmD personally.  7. VT/NSVT - off amio due to bradycardia - Keep K> 4.0 Mg 2.0  8. Fever  - no localizing symptoms. WBC 6.9. - Bcx drawn 6/2. Pending  -  low threshold to add abx   CRITICAL CARE Performed by: Glori Bickers  Total critical care time: 35 minutes  Critical care time was exclusive of separately billable procedures and treating other patients.  Critical care was necessary to treat or prevent imminent or life-threatening deterioration.  Critical care was time spent personally by me (independent of midlevel providers or residents) on the following activities: development of treatment plan with patient and/or surrogate as well as nursing, discussions with consultants, evaluation of patient's response to treatment, examination of patient, obtaining history from patient or surrogate, ordering and performing treatments and interventions, ordering and review of laboratory studies, ordering and review of radiographic studies, pulse oximetry and re-evaluation of patient's condition.  Length of Stay: 6  Glori Bickers MD 09/28/2018, 8:47 AM  Advanced Heart Failure Team Pager [phone] (M-F; [place])  Please contact [facility] for night-coverage after hours (4p -7a ) and weekends on [email or web address]

## 2018-09-29 ENCOUNTER — Encounter (INDEPENDENT_AMBULATORY_CARE_PROVIDER_SITE_OTHER): Payer: Medicare HMO | Admitting: Ophthalmology

## 2018-09-29 ENCOUNTER — Inpatient Hospital Stay (HOSPITAL_COMMUNITY): Payer: Medicare HMO

## 2018-09-29 LAB — RENAL FUNCTION PANEL
Albumin: 2.6 g/dL — ABNORMAL LOW (ref 3.5–5.0)
Albumin: 2.5 g/dL — ABNORMAL LOW (ref 3.5–5.0)
Anion gap: 8 (ref 5–15)
Anion gap: 6 (ref 5–15)
BUN: 27 mg/dL — ABNORMAL HIGH (ref 8–23)
BUN: 26 mg/dL — ABNORMAL HIGH (ref 8–23)
CO2: 24 mmol/L (ref 22–32)
CO2: 26 mmol/L (ref 22–32)
Calcium: 8.6 mg/dL — ABNORMAL LOW (ref 8.9–10.3)
Calcium: 8.3 mg/dL — ABNORMAL LOW (ref 8.9–10.3)
Chloride: 103 mmol/L (ref 98–111)
Chloride: 105 mmol/L (ref 98–111)
Creatinine, Ser: 1.83 mg/dL — ABNORMAL HIGH (ref 0.44–1.00)
Creatinine, Ser: 1.84 mg/dL — ABNORMAL HIGH (ref 0.44–1.00)
GFR calc Af Amer: 33 mL/min — ABNORMAL LOW (ref >60)
GFR calc Af Amer: 33 mL/min — ABNORMAL LOW (ref >60)
GFR calc non Af Amer: 28 mL/min — ABNORMAL LOW (ref >60)
GFR calc non Af Amer: 28 mL/min — ABNORMAL LOW (ref >60)
Glucose, Bld: 120 mg/dL — ABNORMAL HIGH (ref 70–99)
Glucose, Bld: 101 mg/dL — ABNORMAL HIGH (ref 70–99)
Phosphorus: 2 mg/dL — ABNORMAL LOW (ref 2.5–4.6)
Phosphorus: 2.7 mg/dL (ref 2.5–4.6)
Potassium: 4.2 mmol/L (ref 3.5–5.1)
Potassium: 4.6 mmol/L (ref 3.5–5.1)
Sodium: 135 mmol/L (ref 135–145)
Sodium: 137 mmol/L (ref 135–145)

## 2018-09-29 LAB — CBC
HCT: 30.4 % — ABNORMAL LOW (ref 36.0–46.0)
Hemoglobin: 9.6 g/dL — ABNORMAL LOW (ref 12.0–15.0)
MCH: 29.1 pg (ref 26.0–34.0)
MCHC: 31.6 g/dL (ref 30.0–36.0)
MCV: 92.1 fL (ref 80.0–100.0)
Platelets: 131 10*3/uL — ABNORMAL LOW (ref 150–400)
RBC: 3.3 MIL/uL — ABNORMAL LOW (ref 3.87–5.11)
RDW: 21 % — ABNORMAL HIGH (ref 11.5–15.5)
WBC: 6 10*3/uL (ref 4.0–10.5)
nRBC: 0 % (ref 0.0–0.2)

## 2018-09-29 LAB — CULTURE, RESPIRATORY W GRAM STAIN

## 2018-09-29 LAB — GLUCOSE, CAPILLARY
Glucose-Capillary: 106 mg/dL — ABNORMAL HIGH (ref 70–99)
Glucose-Capillary: 111 mg/dL — ABNORMAL HIGH (ref 70–99)
Glucose-Capillary: 113 mg/dL — ABNORMAL HIGH (ref 70–99)
Glucose-Capillary: 117 mg/dL — ABNORMAL HIGH (ref 70–99)
Glucose-Capillary: 122 mg/dL — ABNORMAL HIGH (ref 70–99)
Glucose-Capillary: 125 mg/dL — ABNORMAL HIGH (ref 70–99)
Glucose-Capillary: 130 mg/dL — ABNORMAL HIGH (ref 70–99)
Glucose-Capillary: 164 mg/dL — ABNORMAL HIGH (ref 70–99)
Glucose-Capillary: 177 mg/dL — ABNORMAL HIGH (ref 70–99)

## 2018-09-29 LAB — HEPARIN LEVEL (UNFRACTIONATED): Heparin Unfractionated: 1.18 IU/mL — ABNORMAL HIGH (ref 0.30–0.70)

## 2018-09-29 LAB — APTT: aPTT: 79 seconds — ABNORMAL HIGH (ref 24–36)

## 2018-09-29 MED ORDER — CEFAZOLIN SODIUM-DEXTROSE 2-4 GM/100ML-% IV SOLN
2.0000 g | Freq: Two times a day (BID) | INTRAVENOUS | Status: AC
  Administered 2018-09-29 – 2018-10-05 (×14): 2 g via INTRAVENOUS
  Filled 2018-09-29 (×15): qty 100

## 2018-09-29 MED ORDER — ORAL CARE MOUTH RINSE
15.0000 mL | Freq: Two times a day (BID) | OROMUCOSAL | Status: DC
  Administered 2018-09-29 – 2018-10-05 (×14): 15 mL via OROMUCOSAL

## 2018-09-29 MED ORDER — SODIUM PHOSPHATES 45 MMOLE/15ML IV SOLN
20.0000 mmol | Freq: Once | INTRAVENOUS | Status: AC
  Administered 2018-09-29: 20 mmol via INTRAVENOUS
  Filled 2018-09-29: qty 6.67

## 2018-09-29 MED ORDER — CHLORHEXIDINE GLUCONATE 0.12 % MT SOLN
15.0000 mL | Freq: Two times a day (BID) | OROMUCOSAL | Status: DC
  Administered 2018-09-30 – 2018-10-05 (×12): 15 mL via OROMUCOSAL
  Filled 2018-09-29 (×6): qty 15

## 2018-09-29 MED ORDER — ORAL CARE MOUTH RINSE: 15.0000 mL | Freq: Two times a day (BID) | OROMUCOSAL | Status: DC

## 2018-09-29 NOTE — Progress Notes (Signed)
Pharmacy Antibiotic Note  Kathleen [name] is a 68 y.o. female admitted on 09/22/2018 with moderate S aureus in TA.  Pharmacy has been consulted for vancomycin dosing. Patient currently on CRRT. WBC 6. TA grew MSSA, switching to cefazolin.   Plan: Cefazolin 2g IV q12h Monitor clinical status, CRRT plans, cultures, and length of therapy   Weight: 259 lb 7.7 oz (117.7 kg)  Temp (24hrs), Avg:98.2 F (36.8 C), Min:97 F (36.1 C), Max:98.8 F (37.1 C)  Recent Labs  Lab 09/22/18 1420  09/27/18 0430 09/27/18 1533 09/28/18 0416 09/28/18 1600 09/29/18 0435  WBC 5.7  --  6.9  --   --   --  6.0  CREATININE 2.84*   < > 1.87* 1.91* 1.93* 1.78* 1.83*   < > = values in this interval not displayed.    Estimated Creatinine Clearance: 35.5 mL/min (A) (by C-G formula based on SCr of 1.83 mg/dL (H)).    Allergies  Allergen Reactions  . Doxycycline     REACTION: Wheals and pruritus    Antimicrobials this admission: Vancomycin 6/3 >> 6/4 Cefazolin 6/4 >>  Dose adjustments this admission:  Microbiology results: 6/3 TA: MSSA  6/3 BCx: NG< 24 hr  5/28 mrsa: negative   Thank you for allowing pharmacy to be a part of this patient's care.  Claiborne Billings, PharmD PGY2 Cardiology Pharmacy Resident Please check AMION for all Pharmacist numbers by unit 09/29/2018 9:50 AM

## 2018-09-29 NOTE — Progress Notes (Signed)
Patient refused BiPAP again tonight with me as well as having the RN and charge RN to try and talk her into at least trying it. Patient persistently said "No" over and over and "it would hurt her lungs". Vitals are stable. Will turn patients O2 flow up per MD note if patient refuses BiPAP.

## 2018-09-29 NOTE — Progress Notes (Signed)
Assisted tele visit to patient with family member.  Lyan Holck Anne, RN  

## 2018-09-29 NOTE — Progress Notes (Addendum)
Daily bath deferred d/t pt becoming verbally aggressive as well as physically aggressive with staff- scratching, punching, slapping, & pinching. Pt also continuously pulling at lines, tubes, IV's, & cords. Bilateral safety hand mitts placed on pt.  Pt continues to be confused (Alert to only person) & continues to refuse most treatments.  VSS. Will continue to monitor pt.

## 2018-09-29 NOTE — Progress Notes (Signed)
Pt refusing any interactions, bathing, or therapies. Multiple nurses and nurse techs have gone into the room to attempt to educate pt & calm her down with no success.    Will continue to monitor pt.

## 2018-09-29 NOTE — Progress Notes (Signed)
Advanced Heart Failure Rounding Note   Subjective:     Remains on CRRT. Extubated last night. Refused BIPAP.   This am lethargic but follows commands. CVP 14-15. Anuric. Denies CP or SOB. Very weak. No bleeding on heparin  On cefazolin for MSSA in trach aspirate.   Objective:   Weight Range:  Vital Signs:   Temp:  [97 F (36.1 C)-98.8 F (37.1 C)] 98.3 F (36.8 C) (06/04 0735) Pulse Rate:  [45-71] 48 (06/04 0900) Resp:  [12-24] 17 (06/04 0900) BP: (55-154)/(38-77) 130/42 (06/04 0900) SpO2:  [87 %-97 %] 93 % (06/04 0900) FiO2 (%):  [40 %] 40 % (06/03 1045) Weight:  [117.7 kg] 117.7 kg (06/04 0500) Last BM Date: 09/22/18  Weight change: Filed Weights   09/27/18 0110 09/28/18 0100 09/29/18 0500  Weight: 124.3 kg 120.9 kg 117.7 kg    Intake/Output:   Intake/Output Summary (Last 24 hours) at 09/29/2018 1003 Last data filed at 09/29/2018 0900 Gross per 24 hour  Intake 1106.52 ml  Output 3658 ml  Net -2551.48 ml     Physical Exam: General:  Extubated Weak appearing. No resp difficulty HEENT: normal Neck: supple. RIJ trialysis CVP 14 Carotids 2+ bilat; no bruits. No lymphadenopathy or thryomegaly appreciated. Cor: PMI nondisplaced. Regular rate & rhythm. No rubs, gallops or murmurs. Lungs: clear Abdomen: obese soft, nontender, nondistended. No hepatosplenomegaly. No bruits or masses. Good bowel sounds. Extremities: no cyanosis, clubbing, rash, 2+ edema Neuro: alert & orientedx3, cranial nerves grossly intact. moves all 4 extremities w/o difficulty. Affect pleasant     Telemetry: Sinus brady vs AFL 50s with PVCs/NSVT/bigeminy. Personally reviewed   Labs: Basic Metabolic Panel: Recent Labs  Lab 09/25/18 0400  09/26/18 0357 09/26/18 1552 09/27/18 0430 09/27/18 0436 09/27/18 1516 09/27/18 1533 09/28/18 0416 09/28/18 1600 09/29/18 0435  NA 139   < > 138 136 136 138  --  136 136 137 135  K 3.8   < > 4.9 4.7 4.5 4.7  --  4.1 4.4 4.3 4.2  CL 101   < > 104  102 102  --   --  102 101 102 103  CO2 26   < > 25 26 25   --   --  27 26 27 24   GLUCOSE 124*   < > 116* 169* 158*  --   --  201* 180* 124* 120*  BUN 54*   < > 32* 31* 29*  --   --  32* 29* 27* 27*  CREATININE 2.41*   < > 1.96* 1.83* 1.87*  --   --  1.91* 1.93* 1.78* 1.83*  CALCIUM 8.3*   < > 8.2* 8.4* 8.5*  --   --  8.3* 8.6* 8.5* 8.6*  MG 2.3  --  2.3 2.5* 2.4  --  2.4  --   --   --   --   PHOS 3.1   < > 2.7 2.7 2.1*  --   --  3.0 2.5 2.0* 2.0*   < > = values in this interval not displayed.    Liver Function Tests: Recent Labs  Lab 09/22/18 1420  09/27/18 0430 09/27/18 1533 09/28/18 0416 09/28/18 1600 09/29/18 0435  AST 18  --   --   --   --   --   --   ALT 13  --   --   --   --   --   --   ALKPHOS 127*  --   --   --   --   --   --  BILITOT 1.2  --   --   --   --   --   --   PROT 7.2  --   --   --   --   --   --   ALBUMIN 3.2*   < > 2.7* 2.5* 2.5* 2.5* 2.6*   < > = values in this interval not displayed.   No results for input(s): LIPASE, AMYLASE in the last 168 hours. No results for input(s): AMMONIA in the last 168 hours.  CBC: Recent Labs  Lab 09/22/18 1420  09/24/18 1551 09/25/18 0402 09/27/18 0430 09/27/18 0436 09/29/18 0435  WBC 5.7  --   --   --  6.9  --  6.0  NEUTROABS 3.9  --   --   --   --   --   --   HGB 10.9*   < > 11.2* 11.9* 10.1* 11.9* 9.6*  HCT 38.0   < > 33.0* 35.0* 35.8* 35.0* 30.4*  MCV 85.2  --   --   --  88.4  --  92.1  PLT 133*  --   --   --  150  --  131*   < > = values in this interval not displayed.    Cardiac Enzymes: No results for input(s): CKTOTAL, CKMB, CKMBINDEX, TROPONINI in the last 168 hours.  BNP: BNP (last 3 results) Recent Labs    09/22/18 1420  BNP 451.2*    ProBNP (last 3 results) No results for input(s): PROBNP in the last 8760 hours.    Other results:  Imaging: Dg Chest Port 1 View  Result Date: 09/29/2018 CLINICAL DATA:  Respiratory failure EXAM: PORTABLE CHEST 1 VIEW COMPARISON:  09/28/2018 FINDINGS:  Endotracheal tube and gastric catheter have been removed in the interval. Right jugular central line and left-sided PICC line are again seen and stable. Heart is enlarged in size but stable. Increasing vascular congestion is noted with interstitial edema. Some patchy density is noted which may represent some overlying atelectasis. This is noted in the mid and lower lung fields on the right. IMPRESSION: Increasing vascular congestion/edema and right-sided atelectatic changes. Electronically Signed   By: Inez Catalina M.D.   On: 09/29/2018 07:13   Dg Chest Port 1 View  Result Date: 09/28/2018 CLINICAL DATA:  Respiratory failure EXAM: PORTABLE CHEST 1 VIEW COMPARISON:  09/27/2018 FINDINGS: Endotracheal tube, gastric catheter and right jugular central line are again seen. Left-sided PICC line is noted at the junction of the innominate veins. Persistent but improved vascular congestion is noted. No new focal infiltrate is seen. IMPRESSION: Improvement in the degree of pulmonary edema. No new focal abnormality is seen. Electronically Signed   By: Inez Catalina M.D.   On: 09/28/2018 08:14     Medications:     Scheduled Medications: . atorvastatin  40 mg Oral q1800  . chlorhexidine gluconate (MEDLINE KIT)  15 mL Mouth Rinse BID  . Chlorhexidine Gluconate Cloth  6 each Topical Daily  . gabapentin  100 mg Per Tube Q12H  . insulin aspart  0-20 Units Subcutaneous Q4H  . insulin detemir  65 Units Subcutaneous Daily  . mouth rinse  15 mL Mouth Rinse 10 times per day  . sodium chloride flush  3 mL Intravenous Q12H  . sodium chloride flush  3 mL Intravenous Q12H    Infusions: .  prismasol BGK 4/2.5 500 mL/hr at 09/29/18 0931  .  prismasol BGK 4/2.5 300 mL/hr at 09/28/18 0245  . sodium chloride    .  sodium chloride Stopped (09/29/18 0857)  . sodium chloride    .  ceFAZolin (ANCEF) IV    . famotidine (PEPCID) IV Stopped (09/28/18 1522)  . heparin 900 Units/hr (09/29/18 0900)  . norepinephrine (LEVOPHED)  Adult infusion 7 mcg/min (09/29/18 0934)  . prismasol BGK 4/2.5 1,000 mL/hr at 09/29/18 0932  . sodium phosphate  Dextrose 5% IVPB 43 mL/hr at 09/29/18 0900    PRN Medications: sodium chloride, sodium chloride, Place/Maintain arterial line **AND** sodium chloride, acetaminophen, fentaNYL (SUBLIMAZE) injection, ondansetron (ZOFRAN) IV, sodium chloride flush, sodium chloride flush, sodium chloride flush   Assessment/Plan:   1. PEA arrest on 5/30 - due to hypoxia/pulmonary edema. - On CVVHD Continue to get volume off  2. Acute on chronic hypoxic respiratory failure - due to OHS/OSA  - intubated on 5/30 in setting of arrest.  - extubated 6/3 - now with MSSA in trach aspirate. On cefazolin. - CCM managing  - sleep study 4/15 without OSA (AHI 1.4) but had nocturnal desat - likely need to repeat at some point   3.  Acute on chronic diastolic HF - She has markedly elevated filling pressures complicated by severe cardiorenal syndrome - Now improving with CVVHD with 44 pounds off. CVP 14.  - Remains overloaded. Continue to pull at -100/hr  - Remains anuric. Suspect she may progress to long-term HD  4. Pulmonary HTN, severe  - this is mostly pulmonary venous HTN by cath [facility] 2) but also has a component of OHS/OSA (WHO group 3) - no role for selective pulmonary vasodilators - will improve significantly with volume removal - consider repeat RHC after volume removal   5. AKI on CKD 3 - baseline creatinine 1.9. Peaked at 3.2 -> 3.0 - likely cardiorenal - now on CVVHD. Plan as above. Continue NE to support volume removal and sedation . - Remains anuric Suspect she may progress to long-term HD. Renal following   6. Morbid obesity - desperately needs weight loss  6. Chronic AF/AFL - rate is slow. Off apixaban. Now on heparin with CVVHD. No bleeding. Discussed dosing with PharmD personally.  7. VT/NSVT - off amio due to bradycardia - Keep K> 4.0 Mg 2.0  8. Fever with MSSA PNA  - Bcx drawn 6/2. Negative - sputum cx with MSSA PNA - on cefazolin    CRITICAL CARE Performed by: Glori Bickers  Total critical care time: 35 minutes  Critical care time was exclusive of separately billable procedures and treating other patients.  Critical care was necessary to treat or prevent imminent or life-threatening deterioration.  Critical care was time spent personally by me (independent of midlevel providers or residents) on the following activities: development of treatment plan with patient and/or surrogate as well as nursing, discussions with consultants, evaluation of patient's response to treatment, examination of patient, obtaining history from patient or surrogate, ordering and performing treatments and interventions, ordering and review of laboratory studies, ordering and review of radiographic studies, pulse oximetry and re-evaluation of patient's condition.    Length of Stay: 7  Glori Bickers MD 09/29/2018, 10:03 AM  Advanced Heart Failure Team Pager [phone] (M-F; 7a - 4p)  Please contact [facility] for night-coverage after hours (4p -7a ) and weekends on [email or web address]

## 2018-09-29 NOTE — Progress Notes (Signed)
PT Cancellation Note  Patient Details Name: Kathleen [name] MRN: [id number] DOB: 1950/04/29   Cancelled Treatment:    Reason Eval/Treat Not Completed: Medical issues which prohibited therapy(Pt on CRRT.  Will check back tomorrow as nurse asked HOLD.)   Denice Paradise 09/29/2018, 11:48 AM  [place] Pager:  [phone]  Office:  [phone]

## 2018-09-29 NOTE — Progress Notes (Signed)
Assisted tele visit to patient with daughter.  Jakyrie Totherow P, RN  

## 2018-09-29 NOTE — Progress Notes (Signed)
Subjective:  Pt extubated yesterday-  Nursing notes verbally and physically aggressive- pressors being weaned down, no UOP - CVP better- minus 2700 via CRRT last 24 hours   Objective Vital signs in last 24 hours: Vitals:   09/29/18 0700 09/29/18 0715 09/29/18 0730 09/29/18 0735  BP: (!) 113/52 (!) 117/48    Pulse: (!) 52 (!) 51 (!) 54   Resp: 16 18 17    Temp:    98.3 F (36.8 C)  TempSrc:    Oral  SpO2: 95% 92% 94%   Weight:       Weight change: -3.2 kg  Intake/Output Summary (Last 24 hours) at 09/29/2018 0739 Last data filed at 09/29/2018 0700 Gross per 24 hour  Intake 1320.26 ml  Output 4107 ml  Net -2786.74 ml    Assessment/ Plan: Pt is a 68 y.o. yo female HTN, Afib, DM, morbid obesity, diastolic heart failure and also baseline CKD- crt mid to high 1's who was admitted on 09/22/2018 with volume overload, then suffered cardiac arrest  Assessment/Plan: 1. Renal- A on CRF after cardiac arrest on 5/30.  Baseline crt in the mid to high 1's.   Required initiation of CRRT early on 5/30- overall negative fluid balance on CRRT- almost 14 liters.  Will cont UF - cap at 100 per hour.  No uop so will cont CRRT for now while still on pressors 2. HTN/volume-  Tolerating UF well so far-  elevated CVP and high o2 req- parameters are better 3. Anemia- hgb over 11- now dec in the 9's - no meds  4. [place]-  K  OK for now - phos low, will replete    [name] Meckel    Labs: Basic Metabolic Panel: Recent Labs  Lab 09/28/18 0416 09/28/18 1600 09/29/18 0435  NA 136 137 135  K 4.4 4.3 4.2  CL 101 102 103  CO2 26 27 24   GLUCOSE 180* 124* 120*  BUN 29* 27* 27*  CREATININE 1.93* 1.78* 1.83*  CALCIUM 8.6* 8.5* 8.6*  PHOS 2.5 2.0* 2.0*   Liver Function Tests: Recent Labs  Lab 09/22/18 1420  09/28/18 0416 09/28/18 1600 09/29/18 0435  AST 18  --   --   --   --   ALT 13  --   --   --   --   ALKPHOS 127*  --   --   --   --   BILITOT 1.2  --   --   --   --   PROT 7.2  --   --   --    --   ALBUMIN 3.2*   < > 2.5* 2.5* 2.6*   < > = values in this interval not displayed.   No results for input(s): LIPASE, AMYLASE in the last 168 hours. No results for input(s): AMMONIA in the last 168 hours. CBC: Recent Labs  Lab 09/22/18 1420  09/27/18 0430 09/27/18 0436 09/29/18 0435  WBC 5.7  --  6.9  --  6.0  NEUTROABS 3.9  --   --   --   --   HGB 10.9*   < > 10.1* 11.9* 9.6*  HCT 38.0   < > 35.8* 35.0* 30.4*  MCV 85.2  --  88.4  --  92.1  PLT 133*  --  150  --  131*   < > = values in this interval not displayed.   Cardiac Enzymes: No results for input(s): CKTOTAL, CKMB, CKMBINDEX, TROPONINI in the last 168 hours. CBG:  Recent Labs  Lab 09/28/18 0007 09/28/18 0430 09/28/18 2033 09/29/18 0006 09/29/18 0430  GLUCAP 146* 161* 126* 113* 106*    Iron Studies: No results for input(s): IRON, TIBC, TRANSFERRIN, FERRITIN in the last 72 hours. Studies/Results: Dg Chest Port 1 View  Result Date: 09/29/2018 CLINICAL DATA:  Respiratory failure EXAM: PORTABLE CHEST 1 VIEW COMPARISON:  09/28/2018 FINDINGS: Endotracheal tube and gastric catheter have been removed in the interval. Right jugular central line and left-sided PICC line are again seen and stable. Heart is enlarged in size but stable. Increasing vascular congestion is noted with interstitial edema. Some patchy density is noted which may represent some overlying atelectasis. This is noted in the mid and lower lung fields on the right. IMPRESSION: Increasing vascular congestion/edema and right-sided atelectatic changes. Electronically Signed   By: Inez Catalina M.D.   On: 09/29/2018 07:13   Dg Chest Port 1 View  Result Date: 09/28/2018 CLINICAL DATA:  Respiratory failure EXAM: PORTABLE CHEST 1 VIEW COMPARISON:  09/27/2018 FINDINGS: Endotracheal tube, gastric catheter and right jugular central line are again seen. Left-sided PICC line is noted at the junction of the innominate veins. Persistent but improved vascular congestion is  noted. No new focal infiltrate is seen. IMPRESSION: Improvement in the degree of pulmonary edema. No new focal abnormality is seen. Electronically Signed   By: Inez Catalina M.D.   On: 09/28/2018 08:14   Medications: Infusions: .  prismasol BGK 4/2.5 500 mL/hr at 09/28/18 1806  .  prismasol BGK 4/2.5 300 mL/hr at 09/28/18 0245  . sodium chloride    . sodium chloride 5 mL/hr at 09/29/18 0700  . sodium chloride    . famotidine (PEPCID) IV Stopped (09/28/18 1522)  . heparin 900 Units/hr (09/29/18 0700)  . norepinephrine (LEVOPHED) Adult infusion 8 mcg/min (09/29/18 0700)  . prismasol BGK 4/2.5 1,000 mL/hr at 09/29/18 0601  . vancomycin      Scheduled Medications: . atorvastatin  40 mg Oral q1800  . chlorhexidine gluconate (MEDLINE KIT)  15 mL Mouth Rinse BID  . Chlorhexidine Gluconate Cloth  6 each Topical Daily  . gabapentin  100 mg Per Tube Q12H  . insulin aspart  0-20 Units Subcutaneous Q4H  . insulin detemir  65 Units Subcutaneous Daily  . mouth rinse  15 mL Mouth Rinse 10 times per day  . sodium chloride flush  3 mL Intravenous Q12H  . sodium chloride flush  3 mL Intravenous Q12H    have reviewed scheduled and prn medications.  Physical Exam: General: obese, extubated "I ka kad"  Heart: brady  Lungs: CBS bilat Abdomen: obese, non tender Extremities: significant pitting edema still  Dialysis Access: right IJ vascath placed 5/30     09/29/2018,7:39 AM  LOS: 7 days

## 2018-09-29 NOTE — Progress Notes (Signed)
Orthopedic Tech Progress Note Patient Details:  Kathleen [name] 07-08-1950 [id number]  Ortho Devices Type of Ortho Device: Haematologist Ortho Device/Splint Location: bilateral unna boots, wash legs Ortho Device/Splint Interventions: Application   Post Interventions Patient Tolerated: Well Instructions Provided: Care of device   [name] 09/29/2018, 8:46 AM

## 2018-09-29 NOTE — Progress Notes (Signed)
Pt continually refusing Bipap from RT as well as myself. Charge nurse as well as second RT was asked to come speak with pt to convince her to try it even for a short time. Pt refused by repeatedly saying, "No no no". When asked why she didn't want to try she replied, "No". I asked if it hurt the pt's lungs and she said yes. I educated pt on importance of Bipap, that it might be uncomfortable but that we can provide some pain relief through medicine and repositioning to help ease discomfort. Pt continually shook her head saying, "No".  [facility] was called and informed & pt's HFNC was increased to 9L to aid with O2 sats while she's sleeping.  Pt's VSS.

## 2018-09-29 NOTE — Progress Notes (Signed)
[place] for Heparin Indication: atrial fibrillation  Allergies  Allergen Reactions  . Doxycycline     REACTION: Wheals and pruritus    Patient Measurements: Weight: 259 lb 7.7 oz (117.7 kg)  Vital Signs: Temp: 98.3 F (36.8 C) (06/04 0735) Temp Source: Oral (06/04 0735) BP: 117/48 (06/04 0715) Pulse Rate: 54 (06/04 0730)  Labs: Recent Labs    09/27/18 0430 09/27/18 0436  09/28/18 0416 09/28/18 0830 09/28/18 1029 09/28/18 1600 09/28/18 1855 09/29/18 0435  HGB 10.1* 11.9*  --   --   --   --   --   --  9.6*  HCT 35.8* 35.0*  --   --   --   --   --   --  30.4*  PLT 150  --   --   --   --   --   --   --  131*  APTT 103*  --    < >  --  137* 70*  --  133* 79*  HEPARINUNFRC 2.06*  --   --   --  1.96*  --   --   --  1.18*  CREATININE 1.87*  --    < > 1.93*  --   --  1.78*  --  1.83*   < > = values in this interval not displayed.    Estimated Creatinine Clearance: 35.5 mL/min (A) (by C-G formula based on SCr of 1.83 mg/dL (H)).  Assessment: Pt is a 67yoF with a history of afib. She was on Eliquis PTA, received in hospital, last dose 5/29. Apixaban on hold for potential need for [facility], currently on CRRT. Pharmacy consulted to start heparin. Eliquis interferes with HL monitoring, will monitor aPTT until correlating.  APTT therapeutic with decreased rate at 79 on heparin 900 units/hr running peripherally. HL remains artificially elevated 1.18. Hb decreased today. No signs/symptoms of bleeding or issues with infusion reported by nursing.   Goal of Therapy:  Heparin level 0.3-0.7 units/ml  APTT 66-102 Monitor platelets by anticoagulation protocol: Yes    Plan:  Continue heparin  900 units/hr peripherally  APTT and heparin level daily   Claiborne Billings, PharmD PGY2 Cardiology Pharmacy Resident Please check AMION for all Pharmacist numbers by unit 09/29/2018 7:46 AM

## 2018-09-29 NOTE — Progress Notes (Signed)
NAME:  Kathleen [name], MRN:  [id number], DOB:  11/14/1950, LOS: 7 ADMISSION DATE:  09/22/2018, CONSULTATION DATE: 09/24/2018 REFERRING MD: Candee Furbish MD, CHIEF COMPLAINT: Cardiac arrest  Brief History   68 yo obese woman with chronic diastolic heart failure who failed outpatient diuretics management due to chronic kidney disease.  Right heart cath on 5/29 showed markedly elevated biventricular filling pressures with normal cardiac output.  She was on cardiology service, maintained on milrinone and Lasix.  On the morning of 5/30 she went into PEA arrest, CPR performed for 8 minutes.  Intubated and transferred to ICU.  Started on CRRT 5/31.  Past Medical History   has a past medical history of Atrial flutter ([place]), Bradycardia, Chronic diastolic (congestive) heart failure ([place]) (01/10/2015), CKD (chronic kidney disease), stage IV ([place]), Degenerative joint disease of hand, Diabetes mellitus, Dyslipidemia, Fecal occult blood test positive, GERD (gastroesophageal reflux disease), Headache, Hypertension, Inadequate material resources, Irritable bowel syndrome, Morbid obesity ([place]), Obesity hypoventilation syndrome ([place]), Post-menopausal bleeding, and Shortness of breath dyspnea.  Significant Hospital Events   5/28 Admit 5/29 RHC 5/30 PEA arrest, intubated/ CRRT 5/31  Milrinone stopped due to ectopy; brady episode- amio stopped 6/1- 6/2 attempt at SBT, chest x-ray with worsening pulmonary edema, CVVHD for volume removal  Consults:  Cardiology PCCM  Nephrology   Procedures:  Lt PICC 5/29 >> OETT 5/30 >> R IJ HD cath 5/30 >>  Aline  left ulnar5/30 >> 6/2  Significant Diagnostic Tests:  TTE 5/28 >> 1. The left ventricle has normal systolic function with an ejection fraction of 60-65%. The cavity size was normal. Left ventricular diastolic Doppler parameters are indeterminate. No evidence of left ventricular regional wall motion abnormalities.  2. The right ventricle has normal systolic function.  The cavity was mildly enlarged. There is no increase in right ventricular wall thickness.  3. Right atrial size was mildly dilated.  4. There is mild mitral annular calcification present.  5. The aortic valve is tricuspid. Mild thickening of the aortic valve. Mild calcification of the aortic valve. Aortic valve regurgitation is trivial by color flow Doppler.  6. The aortic root is normal in size and structure.  Right heart cath 5/29 RA = 24 RV = 94/26 PA = 95/36 (53) PCW = 30 (v = 50) Fick cardiac output/index = 6.0/2.7 PVR =3.9 WU FA sat = 91% PA sat = 58%, 58% SVC 60%  Micro Data:  5/28 SARS coronavirus 2 cepheid >> neg 5/28 MRSA PCR >> neg 6/2 Trach aspirate > MSSA 6/2 Blood >>>  Antimicrobials:  Vancomycin 6/3 > 6/4 Cefazolin 6/4 >>>  Interim history/subjective:  Extubated yesterday. Refused BiPAP overnight. Increased to 8L [place].   Objective   Blood pressure (!) 130/42, pulse (!) 48, temperature 98.3 F (36.8 C), temperature source Oral, resp. rate 17, weight 117.7 kg, SpO2 93 %. CVP:  [2 mmHg-18 mmHg] 8 mmHg  FiO2 (%):  [40 %] 40 %   Intake/Output Summary (Last 24 hours) at 09/29/2018 0948 Last data filed at 09/29/2018 0900 Gross per 24 hour  Intake 1125.53 ml  Output 3846 ml  Net -2720.47 ml   Filed Weights   09/27/18 0110 09/28/18 0100 09/29/18 0500  Weight: 124.3 kg 120.9 kg 117.7 kg    Examination: General: morbidly obese female HEENT: [place]/AT, PERRL, unable to appreciate JVD Neuro: Awake, follows commands. Intermittent agitation. Oriented to self only.  CV: Bradycardic, regular.  PULM: Decreased breath sounds bilateral GI: Soft nontender nondistended bowel sounds present morbidly obese pannus  Extremities: No significant lower extremity edema Skin: Ecchymosis to chest/bilateral posterior upper arms.   Chest x-ray 6/4 with increased diffuse opacification.   Resolved Hospital Problem list     Assessment & Plan:   Cardiac arrest, PEA- Suspect hypoxemic  driven. Acute on chronic diastolic heart failure Severe pulmonary hypertension status post right heart catheterization - WHO 2/3 Chronic A. fib Telemetry  Continue levophed to maintain mean arterial pressure greater than 65 Heparin drip for anticoagulation, off apixaban Management per cardiology.   Acute hypoxemic respiratory failure: s/p extubation 6/3. Multifactorial. Predominantly volume overlaod related/pulmonary edema, but tracheal aspirate positive for MSSA.  OSA/OHS - Supplemental O2 keep SpO2 88-95% - QHS BiPAP. Refused last night. Wears CPAP at home. If she cannot tolerate this perhaps we can try HFNC for PEEP.  - Volume removal with CRRT (keeping 13mL negative/hr) - Cefazolin as above  VT/ NSVT -off amnio due to bradycardia  Acute renal failure AKI on chronic kidney disease Likely cardiorenal syndrome CVVHD management per nephrology, continue negative balance 100/hour  Diabetes mellitus type II Morbid obesity SSI with CBGs plus Levemir SLP eval for diet Hold AM levemir dose until PO status determined.   ICU delirium  - establish good sleep/wake cycles. Lights and TV on during day, windows open.   Best practice:  Diet: NPO,  TF  Pain/Anxiety/Delirium protocol (if indicated):  Not indicated VAP protocol (if indicated): Yes DVT prophylaxis: Heparin gtt GI prophylaxis: Pepcid Glucose control: SSI, Levemir Mobility: Bed Code Status: Full Family Communication: Per primary Disposition: ICU (CRRT)   Georgann Housekeeper, AGACNP-BC [place] Pager [phone] or [phone]  09/29/2018 10:00 AM

## 2018-09-30 ENCOUNTER — Other Ambulatory Visit: Payer: Self-pay

## 2018-09-30 ENCOUNTER — Inpatient Hospital Stay (HOSPITAL_COMMUNITY): Payer: Medicare HMO

## 2018-09-30 ENCOUNTER — Other Ambulatory Visit: Payer: Self-pay | Admitting: *Deleted

## 2018-09-30 LAB — CBC
HCT: 31.5 % — ABNORMAL LOW (ref 36.0–46.0)
Hemoglobin: 9.5 g/dL — ABNORMAL LOW (ref 12.0–15.0)
MCH: 26.8 pg (ref 26.0–34.0)
MCHC: 30.2 g/dL (ref 30.0–36.0)
MCV: 88.7 fL (ref 80.0–100.0)
Platelets: 113 10*3/uL — ABNORMAL LOW (ref 150–400)
RBC: 3.55 MIL/uL — ABNORMAL LOW (ref 3.87–5.11)
RDW: 20.6 % — ABNORMAL HIGH (ref 11.5–15.5)
WBC: 4.9 10*3/uL (ref 4.0–10.5)
nRBC: 0.4 % — ABNORMAL HIGH (ref 0.0–0.2)

## 2018-09-30 LAB — RENAL FUNCTION PANEL
Albumin: 2.6 g/dL — ABNORMAL LOW (ref 3.5–5.0)
Albumin: 2.5 g/dL — ABNORMAL LOW (ref 3.5–5.0)
Anion gap: 9 (ref 5–15)
Anion gap: 12 (ref 5–15)
BUN: 26 mg/dL — ABNORMAL HIGH (ref 8–23)
BUN: 27 mg/dL — ABNORMAL HIGH (ref 8–23)
CO2: 25 mmol/L (ref 22–32)
CO2: 24 mmol/L (ref 22–32)
Calcium: 8.5 mg/dL — ABNORMAL LOW (ref 8.9–10.3)
Calcium: 8.3 mg/dL — ABNORMAL LOW (ref 8.9–10.3)
Chloride: 103 mmol/L (ref 98–111)
Chloride: 102 mmol/L (ref 98–111)
Creatinine, Ser: 2.02 mg/dL — ABNORMAL HIGH (ref 0.44–1.00)
Creatinine, Ser: 2.06 mg/dL — ABNORMAL HIGH (ref 0.44–1.00)
GFR calc Af Amer: 29 mL/min — ABNORMAL LOW (ref >60)
GFR calc Af Amer: 28 mL/min — ABNORMAL LOW (ref >60)
GFR calc non Af Amer: 25 mL/min — ABNORMAL LOW (ref >60)
GFR calc non Af Amer: 24 mL/min — ABNORMAL LOW (ref >60)
Glucose, Bld: 89 mg/dL (ref 70–99)
Glucose, Bld: 135 mg/dL — ABNORMAL HIGH (ref 70–99)
Phosphorus: 2 mg/dL — ABNORMAL LOW (ref 2.5–4.6)
Phosphorus: 4 mg/dL (ref 2.5–4.6)
Potassium: 4.5 mmol/L (ref 3.5–5.1)
Potassium: 4.3 mmol/L (ref 3.5–5.1)
Sodium: 137 mmol/L (ref 135–145)
Sodium: 138 mmol/L (ref 135–145)

## 2018-09-30 LAB — HEPARIN LEVEL (UNFRACTIONATED): Heparin Unfractionated: 0.97 IU/mL — ABNORMAL HIGH (ref 0.30–0.70)

## 2018-09-30 LAB — GLUCOSE, CAPILLARY
Glucose-Capillary: 111 mg/dL — ABNORMAL HIGH (ref 70–99)
Glucose-Capillary: 122 mg/dL — ABNORMAL HIGH (ref 70–99)
Glucose-Capillary: 187 mg/dL — ABNORMAL HIGH (ref 70–99)
Glucose-Capillary: 81 mg/dL (ref 70–99)
Glucose-Capillary: 88 mg/dL (ref 70–99)
Glucose-Capillary: 93 mg/dL (ref 70–99)

## 2018-09-30 LAB — APTT: aPTT: 86 seconds — ABNORMAL HIGH (ref 24–36)

## 2018-09-30 MED ORDER — PRO-STAT SUGAR FREE PO LIQD
30.0000 mL | Freq: Three times a day (TID) | ORAL | Status: DC
  Administered 2018-09-30 – 2018-10-06 (×18): 30 mL
  Filled 2018-09-30 (×18): qty 30

## 2018-09-30 MED ORDER — B COMPLEX-C PO TABS
1.0000 | ORAL_TABLET | Freq: Every day | ORAL | Status: DC
  Administered 2018-09-30 – 2018-10-03 (×4): 1 via ORAL
  Filled 2018-09-30 (×5): qty 1

## 2018-09-30 MED ORDER — VITAL 1.5 CAL PO LIQD
1000.0000 mL | ORAL | Status: DC
  Administered 2018-09-30 – 2018-10-06 (×7): 1000 mL
  Filled 2018-09-30 (×7): qty 1000

## 2018-09-30 MED ORDER — SODIUM PHOSPHATES 45 MMOLE/15ML IV SOLN
30.0000 mmol | Freq: Once | INTRAVENOUS | Status: AC
  Administered 2018-09-30: 30 mmol via INTRAVENOUS
  Filled 2018-09-30: qty 10

## 2018-09-30 NOTE — Progress Notes (Signed)
Subjective:   pressors cont to be weaned down, no UOP- maybe one incontinent episode - CVP better- minus 2600 via CRRT last 24 hours   Objective Vital signs in last 24 hours: Vitals:   09/30/18 0630 09/30/18 0645 09/30/18 0700 09/30/18 0800  BP: 107/62 (!) 116/55 127/74 (!) 122/43  Pulse: (!) 56 (!) 56 (!) 55 (!) 53  Resp: (!) 23 (!) 21 15 (!) 22  Temp:    98.4 F (36.9 C)  TempSrc:    Oral  SpO2: 91% 91% 93% 93%  Weight:       Weight change: -5.2 kg  Intake/Output Summary (Last 24 hours) at 09/30/2018 0825 Last data filed at 09/30/2018 0800 Gross per 24 hour  Intake 981.58 ml  Output 3618 ml  Net -2636.42 ml    Assessment/ Plan: Pt is a 68 y.o. yo female HTN, Afib, DM, morbid obesity, diastolic heart failure and also baseline CKD- crt mid to high 1's who was admitted on 09/22/2018 with volume overload, then suffered cardiac arrest  Assessment/Plan: 1. Renal- A on CRF after cardiac arrest on 5/30.  Baseline crt in the mid to high 1's.   Required initiation of CRRT early on 5/30- overall negative fluid balance on CRRT- almost 17 liters.  Will cont UF - cap at 100 per hour.  No uop so will cont CRRT for now while still on pressors.  Since had incont episode will try to collect with purewick - would be hopeful for eventual recovery  2. HTN/volume-  Tolerating UF well so far-  elevated CVP and high o2 req- parameters are better 3. Anemia- hgb over 11- now dec in the 9's - no meds yet 4. [place]-  K  OK for now - phos low, will replete    [name] Meckel    Labs: Basic Metabolic Panel: Recent Labs  Lab 09/29/18 0435 09/29/18 1700 09/30/18 0409  NA 135 137 137  K 4.2 4.6 4.5  CL 103 105 103  CO2 24 26 25   GLUCOSE 120* 101* 89  BUN 27* 26* 26*  CREATININE 1.83* 1.84* 2.02*  CALCIUM 8.6* 8.3* 8.5*  PHOS 2.0* 2.7 2.0*   Liver Function Tests: Recent Labs  Lab 09/29/18 0435 09/29/18 1700 09/30/18 0409  ALBUMIN 2.6* 2.5* 2.6*   No results for input(s): LIPASE,  AMYLASE in the last 168 hours. No results for input(s): AMMONIA in the last 168 hours. CBC: Recent Labs  Lab 09/27/18 0430 09/27/18 0436 09/29/18 0435 09/30/18 0410  WBC 6.9  --  6.0 4.9  HGB 10.1* 11.9* 9.6* 9.5*  HCT 35.8* 35.0* 30.4* 31.5*  MCV 88.4  --  92.1 88.7  PLT 150  --  131* 113*   Cardiac Enzymes: No results for input(s): CKTOTAL, CKMB, CKMBINDEX, TROPONINI in the last 168 hours. CBG: Recent Labs  Lab 09/29/18 1148 09/29/18 1528 09/29/18 1948 09/29/18 2343 09/30/18 0338  GLUCAP 125* 122* 130* 88 93    Iron Studies: No results for input(s): IRON, TIBC, TRANSFERRIN, FERRITIN in the last 72 hours. Studies/Results: Dg Chest Port 1 View  Result Date: 09/29/2018 CLINICAL DATA:  Respiratory failure EXAM: PORTABLE CHEST 1 VIEW COMPARISON:  09/28/2018 FINDINGS: Endotracheal tube and gastric catheter have been removed in the interval. Right jugular central line and left-sided PICC line are again seen and stable. Heart is enlarged in size but stable. Increasing vascular congestion is noted with interstitial edema. Some patchy density is noted which may represent some overlying atelectasis. This is noted in the  mid and lower lung fields on the right. IMPRESSION: Increasing vascular congestion/edema and right-sided atelectatic changes. Electronically Signed   By: Inez Catalina M.D.   On: 09/29/2018 07:13   Medications: Infusions: .  prismasol BGK 4/2.5 500 mL/hr at 09/30/18 0104  .  prismasol BGK 4/2.5 300 mL/hr at 09/29/18 1454  . sodium chloride 10 mL/hr at 09/30/18 0800  . sodium chloride 10 mL/hr at 09/30/18 0800  . sodium chloride    .  ceFAZolin (ANCEF) IV Stopped (09/29/18 2226)  . famotidine (PEPCID) IV Stopped (09/29/18 1342)  . heparin 900 Units/hr (09/30/18 0800)  . norepinephrine (LEVOPHED) Adult infusion Stopped (09/29/18 1611)  . prismasol BGK 4/2.5 1,000 mL/hr at 09/30/18 0702    Scheduled Medications: . atorvastatin  40 mg Oral q1800  . chlorhexidine  15  mL Mouth Rinse BID  . Chlorhexidine Gluconate Cloth  6 each Topical Daily  . gabapentin  100 mg Per Tube Q12H  . insulin aspart  0-20 Units Subcutaneous Q4H  . insulin detemir  65 Units Subcutaneous Daily  . mouth rinse  15 mL Mouth Rinse q12n4p  . sodium chloride flush  3 mL Intravenous Q12H  . sodium chloride flush  3 mL Intravenous Q12H    have reviewed scheduled and prn medications.  Physical Exam: General: obese, no commands today  Heart: brady  Lungs: CBS bilat Abdomen: obese, non tender Extremities: significant pitting edema still  Dialysis Access: right IJ vascath placed 5/30     09/30/2018,8:25 AM  LOS: 8 days

## 2018-09-30 NOTE — Progress Notes (Signed)
Assisted tele visit to patient with daughter in law.  Kathleen [name], [name], RN

## 2018-09-30 NOTE — Progress Notes (Addendum)
Nutrition Follow-up  DOCUMENTATION CODES:   Morbid obesity  INTERVENTION:   -Recommend addition of B-complex with Vit C -Monitor for diet advancement per SLP -Monitor magnesium, potassium, and phosphorus daily  Once Cortrak placed:  -Vital 1.5 @ 41 ml (984 ml) -30 ml Prostat TID  Provides: 1776 kcal, 111 grams protein, 752 ml free water. Meets 100% of needs.   NUTRITION DIAGNOSIS:   Inadequate oral intake related to inability to eat as evidenced by NPO status.  Ongoing   GOAL:   Provide needs based on ASPEN/SCCM guidelines  Addressed via TF  MONITOR:   Vent status, Labs, Weight trends, I & O's, TF tolerance  REASON FOR ASSESSMENT:   Consult Enteral/tube feeding initiation and management  ASSESSMENT:   68 year old with morbid obesity, acute on chronic diastolic heart failure, pulmonary hypertension transferred to the ICU after PEA arrest.  Suspect respiratory failure due to pulmonary edema   5/30 - s/p PEA arrest, intubated and transferred to ICU, CRRT initiated 6/2- failed SBT, pulm edema 6/3 extubated   Pt failed bedside swallow this am. Cortrak to placed today. Pt requiring levophed. Remains on CRRT, awaiting urine output. Refuses BiPAP. Nutrition needs have been adjusted now that pt is extubated. RD to order TF.   Admission weight: 136 kg Current weight: 112.5 kg  General edema noted +2 in upper extremities and +3 in lower extremities.   I/O: -20,827 ml since admit UOP: 1 occurrence x 24 hrs   CRRT: 3,606 ml x 24 hrs   Drips: levophed, sodium phosphate in D5 Medications: SS novolog Labs: Phosphorus 2.0 (L) CBG 88-125  Diet Order:   Diet Order            Diet NPO time specified Except for: Ice Chips  Diet effective now              EDUCATION NEEDS:   No education needs have been identified at this time  Skin:  Skin Assessment: Reviewed RN Assessment(MASD to breast, abdomen)  Last BM:  6/4  Height:   Ht Readings from Last 1 Encounters:   09/22/18 5' (1.524 m)    Weight:   Wt Readings from Last 1 Encounters:  09/30/18 112.5 kg    Ideal Body Weight:  45.5 kg  BMI:  Body mass index is 48.44 kg/m.  Estimated Nutritional Needs:   Kcal:  1600-1800 kcal  Protein:  100-120 grams  Fluid:  >/= 1.6 L/day    Mariana Single RD, LDN Clinical Nutrition Pager # - [phone]

## 2018-09-30 NOTE — Progress Notes (Signed)
[place] for Heparin Indication: atrial fibrillation  Allergies  Allergen Reactions  . Doxycycline     REACTION: Wheals and pruritus    Patient Measurements: Weight: 248 lb 0.3 oz (112.5 kg)  Vital Signs: Temp: 98.4 F (36.9 C) (06/05 0800) Temp Source: Oral (06/05 0800) BP: 106/54 (06/05 0900) Pulse Rate: 63 (06/05 0900)  Labs: Recent Labs    09/28/18 0830  09/28/18 1855 09/29/18 0435 09/29/18 1700 09/30/18 0409 09/30/18 0410  HGB  --   --   --  9.6*  --   --  9.5*  HCT  --   --   --  30.4*  --   --  31.5*  PLT  --   --   --  131*  --   --  113*  APTT 137*   < > 133* 79*  --   --  86*  HEPARINUNFRC 1.96*  --   --  1.18*  --   --  0.97*  CREATININE  --    < >  --  1.83* 1.84* 2.02*  --    < > = values in this interval not displayed.    Estimated Creatinine Clearance: 31.3 mL/min (A) (by C-G formula based on SCr of 2.02 mg/dL (H)).  Assessment: Pt is a 67yoF with a history of afib. She was on Eliquis PTA, received in hospital, last dose 5/29. Apixaban on hold for potential need for [facility], currently on CRRT. Pharmacy consulted to start heparin. Eliquis interferes with HL monitoring, will monitor aPTT until correlating.  APTT therapeutic at 86, on heparin 900 units/hr running peripherally. HL remains artificially elevated 0.97. Hgb stable at 9.5, plt 113 (trending down). No signs/symptoms of bleeding or issues with infusion reported by nursing.   Goal of Therapy:  Heparin level 0.3-0.7 units/ml  APTT 66-102 Monitor platelets by anticoagulation protocol: Yes  Plan:  Continue heparin 900 units/hr peripherally  APTT and heparin level daily Monitor s/sx of bleeding  Antonietta Jewel, PharmD, [place] Clinical Pharmacist  Pager: [phone] Phone: [phone] 09/30/2018 9:21 AM

## 2018-09-30 NOTE — Progress Notes (Signed)
PT Cancellation Note  Patient Details Name: Kathleen [name] MRN: [id number] DOB: 1951-02-19   Cancelled Treatment:    Reason Eval/Treat Not Completed: Medical issues which prohibited therapy; nursing reports pt agitated, not following commands and remains on CRRT.  Will attempt again another day.   Reginia Naas 09/30/2018, 12:01 PM  Magda Kiel, [place] [phone] 09/30/2018

## 2018-09-30 NOTE — Progress Notes (Signed)
NAME:  Kathleen [name], MRN:  [id number], DOB:  Jan 14, 1951, LOS: 69 ADMISSION DATE:  09/22/2018, CONSULTATION DATE: 09/24/2018 REFERRING MD: Candee Furbish MD, CHIEF COMPLAINT: Cardiac arrest  Brief History   68 yo obese woman with chronic diastolic heart failure who failed outpatient diuretics management due to chronic kidney disease.  Right heart cath on 5/29 showed markedly elevated biventricular filling pressures with normal cardiac output.  She was on cardiology service, maintained on milrinone and Lasix.  On the morning of 5/30 she went into PEA arrest, CPR performed for 8 minutes.  Intubated and transferred to ICU.  Started on CRRT 5/31.  Past Medical History   has a past medical history of Atrial flutter ([place]), Bradycardia, Chronic diastolic (congestive) heart failure ([place]) (01/10/2015), CKD (chronic kidney disease), stage IV ([place]), Degenerative joint disease of hand, Diabetes mellitus, Dyslipidemia, Fecal occult blood test positive, GERD (gastroesophageal reflux disease), Headache, Hypertension, Inadequate material resources, Irritable bowel syndrome, Morbid obesity ([place]), Obesity hypoventilation syndrome ([place]), Post-menopausal bleeding, and Shortness of breath dyspnea.  Significant Hospital Events   5/28 Admit 5/29 RHC 5/30 PEA arrest, intubated/ CRRT 5/31  Milrinone stopped due to ectopy; brady episode- amio stopped 6/1- 6/2 attempt at SBT, chest x-ray with worsening pulmonary edema, CVVHD for volume removal 6/3 extubated > remain on CRRT  Consults:  Cardiology PCCM  Nephrology   Procedures:  Lt PICC 5/29 >> OETT 5/30 >> 6/3 R IJ HD cath 5/30 >>  Aline  left ulnar5/30 >> 6/2  Significant Diagnostic Tests:  TTE 5/28 >> 1. The left ventricle has normal systolic function with an ejection fraction of 60-65%. The cavity size was normal. Left ventricular diastolic Doppler parameters are indeterminate. No evidence of left ventricular regional wall motion abnormalities.  2. The right  ventricle has normal systolic function. The cavity was mildly enlarged. There is no increase in right ventricular wall thickness.  3. Right atrial size was mildly dilated.  4. There is mild mitral annular calcification present.  5. The aortic valve is tricuspid. Mild thickening of the aortic valve. Mild calcification of the aortic valve. Aortic valve regurgitation is trivial by color flow Doppler.  6. The aortic root is normal in size and structure.  Right heart cath 5/29 RA = 24 RV = 94/26 PA = 95/36 (53) PCW = 30 (v = 50) Fick cardiac output/index = 6.0/2.7 PVR =3.9 WU FA sat = 91% PA sat = 58%, 58% SVC 60%  Micro Data:  5/28 SARS coronavirus 2 cepheid >> neg 5/28 MRSA PCR >> neg 6/2 Trach aspirate > MSSA 6/2 Blood >>>  Antimicrobials:  Vancomycin 6/3 > 6/4 Cefazolin 6/4 >>>  Interim history/subjective:  Continues to refuse BiPAP. O2 needs up to 8L.   Objective   Blood pressure (!) 106/54, pulse 63, temperature 98.4 F (36.9 C), temperature source Oral, resp. rate 16, weight 112.5 kg, SpO2 92 %. CVP:  [9 mmHg-10 mmHg] 10 mmHg  FiO2 (%):  [50 %-60 %] 50 %   Intake/Output Summary (Last 24 hours) at 09/30/2018 0955 Last data filed at 09/30/2018 0900 Gross per 24 hour  Intake 988.92 ml  Output 3582 ml  Net -2593.08 ml   Filed Weights   09/28/18 0100 09/29/18 0500 09/30/18 0343  Weight: 120.9 kg 117.7 kg 112.5 kg    Examination: General: morbidly obese female in NAD HEENT: [place]/AT, PERRL, unable to appreciate JVD Neuro: Awake, non-focal. Oriented to self only.  CV: RRR, no MRG PULM: Diminished GI: Soft nontender nondistended bowel sounds present  morbidly obese pannus Extremities: Lower extremities wrapped. Tender to touch.  Skin: Ecchymosis to chest and bilateral posterior upper arms.    Resolved Hospital Problem list     Assessment & Plan:   Cardiac arrest, PEA- Suspect hypoxemic driven. Acute on chronic diastolic heart failure Severe pulmonary hypertension  status post right heart catheterization - WHO 2/3 Chronic A. fib Telemetry  Continue levophed to maintain mean arterial pressure greater than 65 Heparin drip for anticoagulation, off apixaban Management per cardiology.   Acute hypoxemic respiratory failure: s/p extubation 6/3. Multifactorial. Predominantly volume overlaod related/pulmonary edema, but tracheal aspirate positive for MSSA.  OSA/OHS - Supplemental O2 keep SpO2 88-95% - QHS BiPAP. Refused last night. Wears CPAP at home. If she cannot tolerate this perhaps we can try HFNC with high flow for PEEP with FiO2 titrated to above goal.  - Volume removal with CRRT per nephrology - Cefazolin as above  VT/ NSVT -off amio due to bradycardia  Acute renal failure AKI on chronic kidney disease Likely cardiorenal syndrome CVVHD management per nephrology  Diabetes mellitus type II Morbid obesity SLP eval for diet Restart levemir once nutrition addressed.  SSI  ICU delirium  - establish good sleep/wake cycles. Lights and TV on during day, windows open.   Best practice:  Diet: NPO,  TF  Pain/Anxiety/Delirium protocol (if indicated):  Not indicated VAP protocol (if indicated): NA DVT prophylaxis: Heparin gtt GI prophylaxis: Pepcid Glucose control: SSI, Levemir Mobility: Bed Code Status: Full Family Communication: Per primary Disposition: ICU (CRRT)   Georgann Housekeeper, AGACNP-BC [place] Pager [phone] or [phone]  09/30/2018 9:55 AM

## 2018-09-30 NOTE — Procedures (Signed)
Cortrak  Person Inserting Tube:  Krisandra Bueno, RD Tube Type:  Cortrak - 43 inches Tube Location:  Right nare Initial Placement:  Stomach Secured by: Bridle Technique Used to Measure Tube Placement:  Documented cm marking at nare/ corner of mouth Cortrak Secured At:  65 cm   .  X-ray is required, abdominal x-ray has been ordered by the Cortrak team. Please confirm tube placement before using the Cortrak tube.   If the tube becomes dislodged please keep the tube and contact the Cortrak team at [email or web address] ([email or web address]) for replacement.  If after hours and replacement cannot be delayed, place a NG tube and confirm placement with an abdominal x-ray.    Mariana Single RD, LDN Clinical Nutrition Pager # [phone]

## 2018-09-30 NOTE — Progress Notes (Signed)
Patient placed on heated HFNC at this time per note of NP. Placed on 20 Lpm and 60% FiO2. Sats 93%. Patient resting comfortably. RN at bedside.

## 2018-09-30 NOTE — Progress Notes (Signed)
Advanced Heart Failure Rounding Note   Subjective:     Remains on CRRT @ -100/hr. Extubated 6/3.  Awake. Follows commands but confused at times. Denies SOB, orthopnea or CP. Has yet to pass swallow study. Weight down another 11 pounds. (55 pounds total) on NE 2. CVP 8-9  Has a urinary incontinence episode last night.   On heparin for AF. No bleeding.   On cefazolin for MSSA in trach aspirate. Afebrile  Initial RHC on 5/29:  RA = 24 RV = 94/26 PA = 95/36 (53) PCW = 30 (v = 50) Fick cardiac output/index = 6.0/2.7 PVR =3.9 WU FA sat = 91% PA sat = 58%, 58% SVC 60%  Objective:   Weight Range:  Vital Signs:   Temp:  [98.4 F (36.9 C)-98.9 F (37.2 C)] 98.4 F (36.9 C) (06/05 0800) Pulse Rate:  [48-63] 63 (06/05 0900) Resp:  [14-30] 16 (06/05 0900) BP: (76-145)/(42-85) 106/54 (06/05 0900) SpO2:  [88 %-96 %] 92 % (06/05 0900) FiO2 (%):  [50 %-60 %] 50 % (06/05 0400) Weight:  [112.5 kg] 112.5 kg (06/05 0343) Last BM Date: 09/29/18  Weight change: Filed Weights   09/28/18 0100 09/29/18 0500 09/30/18 0343  Weight: 120.9 kg 117.7 kg 112.5 kg    Intake/Output:   Intake/Output Summary (Last 24 hours) at 09/30/2018 0923 Last data filed at 09/30/2018 0900 Gross per 24 hour  Intake 988.92 ml  Output 3582 ml  Net -2593.08 ml     Physical Exam: General:  Obese woman lying in bed. No resp difficulty hoarse. Weak . HEENT: normal Neck: supple. RIJ trialysis Carotids 2+ bilat; no bruits. No lymphadenopathy or thryomegaly appreciated. Cor: PMI nondisplaced. Regular brady No rubs, gallops or murmurs. Lungs: clear Abdomen: obese soft, nontender, nondistended. No hepatosplenomegaly. No bruits or masses. Good bowel sounds. Extremities: no cyanosis, clubbing, rash, tr edema Neuro: alert & orientedx3, cranial nerves grossly intact. moves all 4 extremities w/o difficulty. Affect pleasant   Telemetry: Apparent junctional rhythm 50s. Personally reviewed   Labs: Basic  Metabolic Panel: Recent Labs  Lab 09/25/18 0400  09/26/18 0357 09/26/18 1552 09/27/18 0430  09/27/18 1516  09/28/18 0416 09/28/18 1600 09/29/18 0435 09/29/18 1700 09/30/18 0409  NA 139   < > 138 136 136   < >  --    < > 136 137 135 137 137  K 3.8   < > 4.9 4.7 4.5   < >  --    < > 4.4 4.3 4.2 4.6 4.5  CL 101   < > 104 102 102  --   --    < > 101 102 103 105 103  CO2 26   < > 25 26 25   --   --    < > 26 27 24 26 25   GLUCOSE 124*   < > 116* 169* 158*  --   --    < > 180* 124* 120* 101* 89  BUN 54*   < > 32* 31* 29*  --   --    < > 29* 27* 27* 26* 26*  CREATININE 2.41*   < > 1.96* 1.83* 1.87*  --   --    < > 1.93* 1.78* 1.83* 1.84* 2.02*  CALCIUM 8.3*   < > 8.2* 8.4* 8.5*  --   --    < > 8.6* 8.5* 8.6* 8.3* 8.5*  MG 2.3  --  2.3 2.5* 2.4  --  2.4  --   --   --   --   --   --  PHOS 3.1   < > 2.7 2.7 2.1*  --   --    < > 2.5 2.0* 2.0* 2.7 2.0*   < > = values in this interval not displayed.    Liver Function Tests: Recent Labs  Lab 09/28/18 0416 09/28/18 1600 09/29/18 0435 09/29/18 1700 09/30/18 0409  ALBUMIN 2.5* 2.5* 2.6* 2.5* 2.6*   No results for input(s): LIPASE, AMYLASE in the last 168 hours. No results for input(s): AMMONIA in the last 168 hours.  CBC: Recent Labs  Lab 09/25/18 0402 09/27/18 0430 09/27/18 0436 09/29/18 0435 09/30/18 0410  WBC  --  6.9  --  6.0 4.9  HGB 11.9* 10.1* 11.9* 9.6* 9.5*  HCT 35.0* 35.8* 35.0* 30.4* 31.5*  MCV  --  88.4  --  92.1 88.7  PLT  --  150  --  131* 113*    Cardiac Enzymes: No results for input(s): CKTOTAL, CKMB, CKMBINDEX, TROPONINI in the last 168 hours.  BNP: BNP (last 3 results) Recent Labs    09/22/18 1420  BNP 451.2*    ProBNP (last 3 results) No results for input(s): PROBNP in the last 8760 hours.    Other results:  Imaging: Dg Chest Port 1 View  Result Date: 09/29/2018 CLINICAL DATA:  Respiratory failure EXAM: PORTABLE CHEST 1 VIEW COMPARISON:  09/28/2018 FINDINGS: Endotracheal tube and gastric  catheter have been removed in the interval. Right jugular central line and left-sided PICC line are again seen and stable. Heart is enlarged in size but stable. Increasing vascular congestion is noted with interstitial edema. Some patchy density is noted which may represent some overlying atelectasis. This is noted in the mid and lower lung fields on the right. IMPRESSION: Increasing vascular congestion/edema and right-sided atelectatic changes. Electronically Signed   By: Inez Catalina M.D.   On: 09/29/2018 07:13     Medications:     Scheduled Medications:  atorvastatin  40 mg Oral q1800   chlorhexidine  15 mL Mouth Rinse BID   Chlorhexidine Gluconate Cloth  6 each Topical Daily   gabapentin  100 mg Per Tube Q12H   insulin aspart  0-20 Units Subcutaneous Q4H   insulin detemir  65 Units Subcutaneous Daily   mouth rinse  15 mL Mouth Rinse q12n4p   sodium chloride flush  3 mL Intravenous Q12H   sodium chloride flush  3 mL Intravenous Q12H    Infusions:   prismasol BGK 4/2.5 500 mL/hr at 09/30/18 0104    prismasol BGK 4/2.5 300 mL/hr at 09/29/18 1454   sodium chloride 10 mL/hr at 09/30/18 0900   sodium chloride 10 mL/hr at 09/30/18 0900   sodium chloride      ceFAZolin (ANCEF) IV Stopped (09/29/18 2226)   famotidine (PEPCID) IV Stopped (09/29/18 1342)   heparin 900 Units/hr (09/30/18 0900)   norepinephrine (LEVOPHED) Adult infusion 2 mcg/min (09/30/18 0900)   prismasol BGK 4/2.5 1,000 mL/hr at 09/30/18 0702   sodium phosphate  Dextrose 5% IVPB      PRN Medications: sodium chloride, sodium chloride, Place/Maintain arterial line **AND** sodium chloride, acetaminophen, fentaNYL (SUBLIMAZE) injection, ondansetron (ZOFRAN) IV, sodium chloride flush, sodium chloride flush, sodium chloride flush   Assessment/Plan:   1. PEA arrest on 5/30 - due to hypoxia/pulmonary edema. - On CVVHD Continue to get volume off  2. Acute on chronic hypoxic respiratory failure - due  to OHS/OSA  - intubated on 5/30 in setting of arrest.  - extubated 6/3. Statying steady - now with MSSA in trach  aspirate. On cefazolin. - CCM managing  - sleep study 4/15 without OSA (AHI 1.4) but had nocturnal desat - likely need to repeat at some point   3.  Acute on chronic diastolic HF - Admitted with markedly elevated filling pressures complicated by severe cardiorenal syndrome - Now improving with CVVHD with 55 pounds off. CVP 8-9.  - Volume status getting close to baseline. Continue to pull at -100/hr per Renal. May be able to start keeping even soon. - Suspect she may progress to long-term HD but had some urine out yesterday so may recover some   4. Pulmonary HTN, severe  - this is mostly pulmonary venous HTN by cath [facility] 2) but also has a component of OHS/OSA (WHO group 3) - no role for selective pulmonary vasodilators - will improve significantly with volume removal - consider repeat RHC after volume removal   5. AKI on CKD 3 - baseline creatinine 1.9 -> ESRD - likely cardiorenal - I was suspecitng she may progress to long-term HD but had some urine out last night so we will see. Renal following.  6. Morbid obesity - desperately needs weight loss  6. Chronic AF/AFL - Off apixaban. Now on heparin with CVVHD. No bleeding.Discussed dosing with PharmD personally. - Rhythm looks junctional currently  7. VT/NSVT - off amio due to bradycardia - Keep K> 4.0 Mg 2.0  8. Fever with MSSA PNA - Bcx drawn 6/2. Negative - sputum cx with MSSA PNA - on cefazolin   Will continue to follow while in ICU but when transfers out of unit should [facility] to [facility],.   Length of Stay: 8  Glori Bickers MD 09/30/2018, 9:23 AM  Advanced Heart Failure Team Pager [phone] (M-F; [place])  Please contact [facility] for night-coverage after hours (4p -7a ) and weekends on [email or web address]

## 2018-09-30 NOTE — Evaluation (Signed)
Clinical/Bedside Swallow Evaluation Patient Details  Name: Kathleen [name] MRN: [id number] Date of Birth: [birth year]  Today's Date: 09/30/2018 Time: SLP Start Time (ACUTE ONLY): 0940 SLP Stop Time (ACUTE ONLY): 1015 SLP Time Calculation (min) (ACUTE ONLY): 35 min  Past Medical History:  Past Medical History:  Diagnosis Date  . Atrial flutter ([place])    a. permanent.  . Bradycardia   . Chronic diastolic (congestive) heart failure ([place]) 01/10/2015  . CKD (chronic kidney disease), stage IV ([place])   . Degenerative joint disease of hand   . Diabetes mellitus   . Dyslipidemia   . Fecal occult blood test positive   . GERD (gastroesophageal reflux disease)   . Headache   . Hypertension   . Inadequate material resources   . Irritable bowel syndrome   . Morbid obesity ([place])   . Obesity hypoventilation syndrome ([place])   . Post-menopausal bleeding   . Shortness of breath dyspnea    multifactorial from obesity, deconditioning, obesity hypoventilation syndrome   Past Surgical History:  Past Surgical History:  Procedure Laterality Date  . CHOLECYSTECTOMY    . EYE SURGERY     left lens implant s/p cataracts  . OTHER SURGICAL HISTORY     right shoulder tendon repair 05/2011  . RIGHT HEART CATH N/A 09/23/2018   Procedure: RIGHT HEART CATH;  Surgeon: Jolaine Artist, MD;  Location: [facility];  Service: Cardiovascular;  Laterality: N/A;   HPI:  68 y.o. female admitted 09/22/2018 for surgery. PMH: permanent atrial flutter with baseline bradycardia, hypertension, super morbid obesity, IIDDM, dyslipidemia, obesity hypoventilation syndrome, CKD stage IV, chronic diastolic CHF, chronic edema. Intubated 5/30-09/28/2018. PEA 09/24/2018. CXR = increase edema pattern with some confluence of infiltrate in the right lower lobe   Assessment / Plan / Recommendation Clinical Impression  Pt was seen at bedside for clinical swallow evaluation to determine readiness for po intake. Oral care was completed  with suction. Pt is edentulous. Pt exhibited difficulty following verbal commands during oral care. Oral cavity was noted to be moist and healthy following oral care. Pt was given individual ice chips, which pt swallowed immediately. With encouragement, pt was able to allow the ice to melt before she swallowed. Suspect delayed swallow reflex, as pt exhibited immediate weak throat clear and delayed weak cough. Pt was also provided with small bites of puree, which resulted in the same response - immediate weak throat clear, and delayed weak cough.  Pt was unable to cough on command, raising concern for pt ability to protect airway effectively. Given presentation at bedside and high risk indicators (extended intubation, PEA), strict NPO status continues to be recommended. SLP will follow to assess readiness for po intake and/or instrumental study. RN and MD informed of results and recommendations.   SLP Visit Diagnosis: Dysphagia, unspecified (R13.10)    Aspiration Risk  Severe aspiration risk;Risk for inadequate nutrition/hydration    Diet Recommendation NPO   Medication Administration: Via alternative means    Other  Recommendations Oral Care Recommendations: Oral care QID Other Recommendations: Have oral suction available   Follow up Recommendations (TBD)      Frequency and Duration min 1 x/week  2 weeks       Prognosis Prognosis for Safe Diet Advancement: Good Barriers to Reach Goals: Cognitive deficits      Swallow Study   General Date of Onset: 09/22/18 HPI: 68 y.o. female admitted 09/22/2018 for surgery. PMH: permanent atrial flutter with baseline bradycardia, hypertension, super morbid obesity, IIDDM, dyslipidemia,  obesity hypoventilation syndrome, CKD stage IV, chronic diastolic CHF, chronic edema. Intubated 5/30-09/28/2018. PEA 09/24/2018. CXR = increase edema pattern with some confluence of infiltrate in the right lower lobe Type of Study: Bedside Swallow Evaluation Previous  Swallow Assessment: none Diet Prior to this Study: NPO Temperature Spikes Noted: No Respiratory Status: Nasal cannula History of Recent Intubation: Yes Length of Intubations (days): 4 days Date extubated: 09/28/18 Behavior/Cognition: Alert;Doesn't follow directions;Confused Oral Cavity Assessment: Within Functional Limits Oral Care Completed by SLP: Yes Oral Cavity - Dentition: Edentulous Self-Feeding Abilities: Total assist Patient Positioning: Upright in bed Baseline Vocal Quality: Breathy;Low vocal intensity Volitional Cough: Cognitively unable to elicit Volitional Swallow: Unable to elicit    Oral/Motor/Sensory Function Overall Oral Motor/Sensory Function: (generalized weakness)   Ice Chips Ice chips: Impaired Oral Phase Impairments: Poor awareness of bolus Pharyngeal Phase Impairments: Suspected delayed Swallow;Throat Clearing - Immediate;Cough - Delayed   Thin Liquid Thin Liquid: Not tested    Nectar Thick Nectar Thick Liquid: Not tested   Honey Thick Honey Thick Liquid: Not tested   Puree Puree: Impaired Pharyngeal Phase Impairments: Suspected delayed Swallow;Cough - Delayed;Wet Vocal Quality   Solid     Solid: Not tested     Celia B. Quentin Ore, [facility], [place] Speech Language Pathologist [phone]  Shonna Chock 09/30/2018,10:28 AM

## 2018-09-30 NOTE — Patient Outreach (Signed)
[facility]) Care Management  09/30/2018  Kathleen [name] 05-Aug-1950 [id number]   RN Health Coach case closure. Patient is in the hospital at this time. It is noted that  patient is in a SNP.under [employer].  Plan: Case closure Letter sent to patient and physician SNP notified  [facility] [phone]

## 2018-10-01 DIAGNOSIS — J9602 Acute respiratory failure with hypercapnia: Secondary | ICD-10-CM

## 2018-10-01 DIAGNOSIS — J9601 Acute respiratory failure with hypoxia: Secondary | ICD-10-CM

## 2018-10-01 LAB — CBC
HCT: 34.9 % — ABNORMAL LOW (ref 36.0–46.0)
Hemoglobin: 10.2 g/dL — ABNORMAL LOW (ref 12.0–15.0)
MCH: 25.1 pg — ABNORMAL LOW (ref 26.0–34.0)
MCHC: 29.2 g/dL — ABNORMAL LOW (ref 30.0–36.0)
MCV: 85.7 fL (ref 80.0–100.0)
Platelets: 111 10*3/uL — ABNORMAL LOW (ref 150–400)
RBC: 4.07 MIL/uL (ref 3.87–5.11)
RDW: 19.7 % — ABNORMAL HIGH (ref 11.5–15.5)
WBC: 5.5 10*3/uL (ref 4.0–10.5)
nRBC: 0.4 % — ABNORMAL HIGH (ref 0.0–0.2)

## 2018-10-01 LAB — RENAL FUNCTION PANEL
Albumin: 2.5 g/dL — ABNORMAL LOW (ref 3.5–5.0)
Albumin: 2.3 g/dL — ABNORMAL LOW (ref 3.5–5.0)
Anion gap: 9 (ref 5–15)
Anion gap: 9 (ref 5–15)
BUN: 34 mg/dL — ABNORMAL HIGH (ref 8–23)
BUN: 36 mg/dL — ABNORMAL HIGH (ref 8–23)
CO2: 26 mmol/L (ref 22–32)
CO2: 26 mmol/L (ref 22–32)
Calcium: 8.3 mg/dL — ABNORMAL LOW (ref 8.9–10.3)
Calcium: 8.1 mg/dL — ABNORMAL LOW (ref 8.9–10.3)
Chloride: 104 mmol/L (ref 98–111)
Chloride: 103 mmol/L (ref 98–111)
Creatinine, Ser: 2.18 mg/dL — ABNORMAL HIGH (ref 0.44–1.00)
Creatinine, Ser: 2.21 mg/dL — ABNORMAL HIGH (ref 0.44–1.00)
GFR calc Af Amer: 26 mL/min — ABNORMAL LOW (ref >60)
GFR calc Af Amer: 26 mL/min — ABNORMAL LOW (ref >60)
GFR calc non Af Amer: 23 mL/min — ABNORMAL LOW (ref >60)
GFR calc non Af Amer: 22 mL/min — ABNORMAL LOW (ref >60)
Glucose, Bld: 190 mg/dL — ABNORMAL HIGH (ref 70–99)
Glucose, Bld: 198 mg/dL — ABNORMAL HIGH (ref 70–99)
Phosphorus: 2.5 mg/dL (ref 2.5–4.6)
Phosphorus: 2.5 mg/dL (ref 2.5–4.6)
Potassium: 4.1 mmol/L (ref 3.5–5.1)
Potassium: 4.2 mmol/L (ref 3.5–5.1)
Sodium: 139 mmol/L (ref 135–145)
Sodium: 138 mmol/L (ref 135–145)

## 2018-10-01 LAB — GLUCOSE, CAPILLARY
Glucose-Capillary: 166 mg/dL — ABNORMAL HIGH (ref 70–99)
Glucose-Capillary: 171 mg/dL — ABNORMAL HIGH (ref 70–99)
Glucose-Capillary: 176 mg/dL — ABNORMAL HIGH (ref 70–99)
Glucose-Capillary: 186 mg/dL — ABNORMAL HIGH (ref 70–99)
Glucose-Capillary: 218 mg/dL — ABNORMAL HIGH (ref 70–99)
Glucose-Capillary: 218 mg/dL — ABNORMAL HIGH (ref 70–99)
Glucose-Capillary: 226 mg/dL — ABNORMAL HIGH (ref 70–99)

## 2018-10-01 LAB — APTT: aPTT: 91 seconds — ABNORMAL HIGH (ref 24–36)

## 2018-10-01 LAB — HEPARIN LEVEL (UNFRACTIONATED): Heparin Unfractionated: 0.83 IU/mL — ABNORMAL HIGH (ref 0.30–0.70)

## 2018-10-01 MED ORDER — RISPERIDONE 1 MG/ML PO SOLN
0.5000 mg | Freq: Two times a day (BID) | ORAL | Status: DC | PRN
  Administered 2018-10-01 – 2018-10-03 (×5): 0.5 mg
  Filled 2018-10-01 (×7): qty 0.5

## 2018-10-01 MED ORDER — MIDODRINE HCL 5 MG PO TABS
5.0000 mg | ORAL_TABLET | Freq: Three times a day (TID) | ORAL | Status: DC
  Administered 2018-10-01 – 2018-10-02 (×4): 5 mg via ORAL
  Filled 2018-10-01 (×4): qty 1

## 2018-10-01 MED ORDER — ONDANSETRON HCL 4 MG/2ML IJ SOLN
4.0000 mg | Freq: Three times a day (TID) | INTRAMUSCULAR | Status: DC | PRN
  Administered 2018-10-01 – 2018-10-27 (×4): 4 mg via INTRAVENOUS
  Filled 2018-10-01 (×5): qty 2

## 2018-10-01 NOTE — Progress Notes (Addendum)
NAME:  NAKINA SPATZ, MRN:  [id number], DOB:  Oct 16, 1950, LOS: 9 ADMISSION DATE:  09/22/2018, CONSULTATION DATE: 09/24/2018 REFERRING MD: Candee Furbish MD, CHIEF COMPLAINT: Cardiac arrest  Brief History   68 yo obese woman with chronic diastolic heart failure who failed outpatient diuretics management due to chronic kidney disease.  Right heart cath on 5/29 showed markedly elevated biventricular filling pressures with normal cardiac output.  She was on cardiology service, maintained on milrinone and Lasix.  On the morning of 5/30 she went into PEA arrest, CPR performed for 8 minutes.  Intubated and transferred to ICU.  Started on CRRT 5/31.  Past Medical History   has a past medical history of Atrial flutter ([place]), Bradycardia, Chronic diastolic (congestive) heart failure ([place]) (01/10/2015), CKD (chronic kidney disease), stage IV ([place]), Degenerative joint disease of hand, Diabetes mellitus, Dyslipidemia, Fecal occult blood test positive, GERD (gastroesophageal reflux disease), Headache, Hypertension, Inadequate material resources, Irritable bowel syndrome, Morbid obesity ([place]), Obesity hypoventilation syndrome ([place]), Post-menopausal bleeding, and Shortness of breath dyspnea.  Significant Hospital Events   5/28 Admit 5/29 RHC 5/30 PEA arrest, intubated/ CRRT 5/31  Milrinone stopped due to ectopy; brady episode- amio stopped 6/1- 6/2 attempt at SBT, chest x-ray with worsening pulmonary edema, CVVHD for volume removal 6/3 extubated > remain on CRRT  Consults:  Cardiology PCCM  Nephrology   Procedures:  Lt PICC 5/29 >> OETT 5/30 >> 6/3 R IJ HD cath 5/30 >>  Aline  left ulnar5/30 >> 6/2  Significant Diagnostic Tests:  TTE 5/28 >> 1. The left ventricle has normal systolic function with an ejection fraction of 60-65%. The cavity size was normal. Left ventricular diastolic Doppler parameters are indeterminate. No evidence of left ventricular regional wall motion abnormalities.  2. The right  ventricle has normal systolic function. The cavity was mildly enlarged. There is no increase in right ventricular wall thickness.  3. Right atrial size was mildly dilated.  4. There is mild mitral annular calcification present.  5. The aortic valve is tricuspid. Mild thickening of the aortic valve. Mild calcification of the aortic valve. Aortic valve regurgitation is trivial by color flow Doppler.  6. The aortic root is normal in size and structure.  Right heart cath 5/29 RA = 24 RV = 94/26 PA = 95/36 (53) PCW = 30 (v = 50) Fick cardiac output/index = 6.0/2.7 PVR =3.9 WU FA sat = 91% PA sat = 58%, 58% SVC 60%  Micro Data:  5/28 SARS coronavirus 2 cepheid >> neg 5/28 MRSA PCR >> neg 6/2 Trach aspirate > MSSA 6/2 Blood >>>  Antimicrobials:  Vancomycin 6/3 > 6/4 Cefazolin 6/4 >>>  Interim history/subjective:  Continues to refuse BiPAP. O2 needs up to 8L.   Objective   Blood pressure (!) 106/45, pulse (!) 53, temperature 99.8 F (37.7 C), temperature source Oral, resp. rate (!) 21, weight 105.8 kg, SpO2 (!) 85 %. CVP:  [0 mmHg-6 mmHg] 2 mmHg      Intake/Output Summary (Last 24 hours) at 10/01/2018 1113 Last data filed at 10/01/2018 1100 Gross per 24 hour  Intake 1837.23 ml  Output 3941 ml  Net -2103.77 ml   Filed Weights   09/29/18 0500 09/30/18 0343 10/01/18 0400  Weight: 117.7 kg 112.5 kg 105.8 kg    Examination: General: Morbid obese female who is awake and interactive HEENT: No neck.  No appreciable JVD Neuro: Follows simple commands not very communicative CV: Sounds are distant PULM: even/non-labored, lungs bilaterally diminished bases QZ:RAQT, non-tender, bsx4 active  Extremities: Lower extremities are wrapped for chronic edema Skin: no rashes or lesions    Resolved Hospital Problem list     Assessment & Plan:   Cardiac arrest, PEA- Suspect hypoxemic driven. Acute on chronic diastolic heart failure Severe pulmonary hypertension status post right heart  catheterization - WHO 2/3 Chronic A. fib Monitoring cardiac Weaning Levophed she is down to 2 mcg now Drip per cardiology Managed per cardiology  Acute hypoxemic respiratory failure: s/p extubation 6/3. Multifactorial. Predominantly volume overlaod related/pulmonary edema, but tracheal aspirate positive for MSSA.  OSA/OHS 2 as needed Nightly BiPAP or CPAP as tolerated HFNC with high flow for PEEP with FiO2 titrated to above goal.  High flow nasal cannula as needed Antibiotics  as above  VT/ NSVT -off amio due to bradycardia   Acute renal failure AKI on chronic kidney disease Likely cardiorenal syndrome CVVHD per nephrology  Diabetes mellitus type II Morbid obesity CBG (last 3)  Recent Labs    09/30/18 1938 09/30/18 2333 10/01/18 0328  GLUCAP 187* 226* 176*   Unable to tolerate diet at this time per speech therapy Restart Levemir in the near future Monitor sliding scale insulin  ICU delirium  Follow standard protocol to reorientate  Best practice:  Diet: NPO,  TF  Pain/Anxiety/Delirium protocol (if indicated):  Not indicated VAP protocol (if indicated): NA DVT prophylaxis: Heparin gtt GI prophylaxis: Pepcid Glucose control: SSI, Levemir Mobility: Bed Code Status: Full Family Communication: Per primary Disposition: ICU (CRRT)   Richardson Landry Minor ACNP Maryanna Shape PCCM Pager [phone] till 1 pm If no answer page [phone] 10/01/2018, 11:13 AM   PCCM attending: This is 68 year old female status post PEA cardiac arrest, hypoxic driven, chronic RV failure, severe pulmonary hypertension, acute on chronic diastolic heart failure.  Liberated from mechanical ventilator.  Baseline OSA OHS.  Acute renal failure acute on chronic kidney disease requiring CVVHD.  BP (!) 88/50   Pulse (!) 56   Temp 98.9 F (37.2 C) (Oral)   Resp (!) 23   Wt 105.8 kg   SpO2 96%   BMI 45.55 kg/m   Gen: Morbidly obese female, resting in bed is comfortable on CVVHD. Neuro: Awake alert  following commands Cardiac: Distant heart tones, S1-S2, sinus on telemetry. Lungs: Bilateral breath sounds, no crackles no wheeze Abdomen, soft nontender nondistended  A: Acute on chronic hypoxemic respiratory failure, chronic hypercapnic respiratory failure due to OHS/OSA, volume removed and improved, acute on chronic diastolic heart failure, severe pulmonary hypertension. AKI on CKD stage III Status post PEA cardiac arrest, good neurologic recovery MSSA, staph pneumonia  P: Patient should continue use of BiPAP nightly and with naps.  This should be mandatory. Volume management with CVVHD per nephrology Blood pressure remained stable off of vasopressors. Right heart cath planned once volume status reached Continue cefazolin PCCM will sign off.  Please call if needed.  Garner Nash, DO [facility] 10/01/2018 2:00 PM  Personal pager: [phone] If unanswered, please page CCM On-call: [phone]

## 2018-10-01 NOTE — Progress Notes (Signed)
[place] for Heparin Indication: atrial fibrillation  Allergies  Allergen Reactions  . Doxycycline     REACTION: Wheals and pruritus    Patient Measurements: Weight: 233 lb 4 oz (105.8 kg)  Vital Signs: Temp: 100.3 F (37.9 C) (06/06 0300) Temp Source: Oral (06/06 0300) BP: 103/69 (06/06 0700) Pulse Rate: 69 (06/06 0700)  Labs: Recent Labs    09/29/18 0435  09/30/18 0409 09/30/18 0410 09/30/18 1600 10/01/18 0355  HGB 9.6*  --   --  9.5*  --  10.2*  HCT 30.4*  --   --  31.5*  --  34.9*  PLT 131*  --   --  113*  --  111*  APTT 79*  --   --  86*  --  91*  HEPARINUNFRC 1.18*  --   --  0.97*  --  0.83*  CREATININE 1.83*   < > 2.02*  --  2.06* 2.18*   < > = values in this interval not displayed.    Estimated Creatinine Clearance: 27.9 mL/min (A) (by C-G formula based on SCr of 2.18 mg/dL (H)).  Assessment: Pt is a 67yoF with a history of afib. She was on Eliquis PTA, received in hospital, last dose 5/29. Apixaban on hold for potential need for [facility], currently on CRRT. Pharmacy consulted to start heparin. Eliquis interferes with HL monitoring, will monitor aPTT until correlating.  APTT therapeutic at 91, on heparin 900 units/hr running peripherally. Heparin level starting to correlate and likely close to being accurate at 0.83. Hgb stable at 10.2, plt 111 (stable). No signs/symptoms of bleeding or issues with infusion reported by nursing.   Goal of Therapy:  Heparin level 0.3-0.7 units/ml  APTT 66-102 Monitor platelets by anticoagulation protocol: Yes  Plan:  Decrease heparin to 800 units/hr peripherally  APTT and heparin level daily Monitor s/sx of bleeding  [name] PharmD., BCPS Clinical Pharmacist 10/01/2018 7:50 AM

## 2018-10-01 NOTE — Progress Notes (Signed)
Subjective:   pressors still on, no or minimal UOP- maybe one incontinent episode - CVP much better- minus 2400 via CRRT last 24 hours   Objective Vital signs in last 24 hours: Vitals:   10/01/18 0500 10/01/18 0530 10/01/18 0600 10/01/18 0630  BP: (!) 98/58 (!) 109/55 (!) 120/53 (!) 112/56  Pulse: (!) 55 (!) 54 (!) 52 (!) 53  Resp: 19 15 17 15   Temp:      TempSrc:      SpO2: 95% 94% 95% 92%  Weight:       Weight change: -6.7 kg  Intake/Output Summary (Last 24 hours) at 10/01/2018 0739 Last data filed at 10/01/2018 0700 Gross per 24 hour  Intake 1723.12 ml  Output 4195 ml  Net -2471.88 ml    Assessment/ Plan: Pt is a 68 y.o. yo female HTN, Afib, DM, morbid obesity, diastolic heart failure and also baseline CKD- crt mid to high 1's who was admitted on 09/22/2018 with volume overload, then suffered cardiac arrest  Assessment/Plan: 1. Renal- A on CRF after cardiac arrest on 5/30.  Baseline crt in the mid to high 1's.   Required initiation of CRRT early on 5/30- overall negative fluid balance on CRRT- almost 20 liters.  Will now back off to running even.  No uop so will cont CRRT for now while still on pressors.  Since had incont episode will try to collect with purewick - would be hopeful for eventual recovery  2. HTN/volume-  Tolerating UF well so far-  elevated CVP and high o2 req- parameters are better- will dec to running even  3. Anemia- hgb close to 10 - no meds yet 4. [place]-  K  OK for now - phos repleted 6/5 suspect will need again on 6/7    [facility] Taeko Schaffer    Labs: Basic Metabolic Panel: Recent Labs  Lab 09/30/18 0409 09/30/18 1600 10/01/18 0355  NA 137 138 139  K 4.5 4.3 4.1  CL 103 102 104  CO2 25 24 26   GLUCOSE 89 135* 190*  BUN 26* 27* 34*  CREATININE 2.02* 2.06* 2.18*  CALCIUM 8.5* 8.3* 8.3*  PHOS 2.0* 4.0 2.5   Liver Function Tests: Recent Labs  Lab 09/30/18 0409 09/30/18 1600 10/01/18 0355  ALBUMIN 2.6* 2.5* 2.5*   No results for input(s):  LIPASE, AMYLASE in the last 168 hours. No results for input(s): AMMONIA in the last 168 hours. CBC: Recent Labs  Lab 09/27/18 0430  09/29/18 0435 09/30/18 0410 10/01/18 0355  WBC 6.9  --  6.0 4.9 5.5  HGB 10.1*   < > 9.6* 9.5* 10.2*  HCT 35.8*   < > 30.4* 31.5* 34.9*  MCV 88.4  --  92.1 88.7 85.7  PLT 150  --  131* 113* 111*   < > = values in this interval not displayed.   Cardiac Enzymes: No results for input(s): CKTOTAL, CKMB, CKMBINDEX, TROPONINI in the last 168 hours. CBG: Recent Labs  Lab 09/30/18 1113 09/30/18 1546 09/30/18 1938 09/30/18 2333 10/01/18 0328  GLUCAP 111* 122* 187* 226* 176*    Iron Studies: No results for input(s): IRON, TIBC, TRANSFERRIN, FERRITIN in the last 72 hours. Studies/Results: Dg Abd Portable 1v  Result Date: 09/30/2018 CLINICAL DATA:  NG tube placement. EXAM: PORTABLE ABDOMEN - 1 VIEW COMPARISON:  None. FINDINGS: The NG tube tip is in the antropyloric region of the stomach. The bowel gas pattern is unremarkable. IMPRESSION: NG tube tip in the antropyloric region of the stomach. Electronically  Signed   By: Marijo Sanes M.D.   On: 09/30/2018 13:14   Medications: Infusions: .  prismasol BGK 4/2.5 500 mL/hr at 10/01/18 0443  .  prismasol BGK 4/2.5 300 mL (10/01/18 0311)  . sodium chloride    .  ceFAZolin (ANCEF) IV Stopped (09/30/18 2246)  . famotidine (PEPCID) IV Stopped (09/30/18 1501)  . heparin 900 Units/hr (10/01/18 0700)  . norepinephrine (LEVOPHED) Adult infusion 8 mcg/min (10/01/18 0700)  . prismasol BGK 4/2.5 1,000 mL/hr (09/30/18 2247)    Scheduled Medications: . atorvastatin  40 mg Oral q1800  . B-complex with vitamin C  1 tablet Oral Daily  . chlorhexidine  15 mL Mouth Rinse BID  . Chlorhexidine Gluconate Cloth  6 each Topical Daily  . feeding supplement (PRO-STAT SUGAR FREE 64)  30 mL Per Tube TID  . feeding supplement (VITAL 1.5 CAL)  1,000 mL Per Tube Q24H  . gabapentin  100 mg Per Tube Q12H  . insulin aspart  0-20  Units Subcutaneous Q4H  . mouth rinse  15 mL Mouth Rinse q12n4p    have reviewed scheduled and prn medications.  Physical Exam: General: obese, more alert today but still in restraints  Heart: brady  Lungs: CBS bilat Abdomen: obese, non tender Extremities: significant pitting edema still  Dialysis Access: right IJ vascath placed 5/30     10/01/2018,7:39 AM  LOS: 9 days

## 2018-10-01 NOTE — Progress Notes (Signed)
  Speech Language Pathology Treatment: Dysphagia  Patient Details Name: Kathleen [name] MRN: [id number] DOB: [birth year] Today's Date: 10/01/2018 Time: 8182-9937 SLP Time Calculation (min) (ACUTE ONLY): 11 min  Assessment / Plan / Recommendation Clinical Impression  Pt with continued overt s/s of aspiration with thin liquids including multiple swallows and immediate cough with tsp amounts of thin liquids; ice chips given one at a time with pt not exhibiting overt s/s of aspiration; noted increased alertness level and dysphonic vocal quality, improved since BSE completed; continue NPO with ice chips allowed before and after oral care; discussed treatment plan with pt/nursing; ST will continue to f/u for PO readiness and objective assessment prn.  HPI HPI: 68 y.o. female admitted 09/22/2018 for surgery. PMH: permanent atrial flutter with baseline bradycardia, hypertension, super morbid obesity, IIDDM, dyslipidemia, obesity hypoventilation syndrome, CKD stage IV, chronic diastolic CHF, chronic edema. Intubated 5/30-09/28/2018. PEA 09/24/2018. CXR = increase edema pattern with some confluence of infiltrate in the right lower lobe      SLP Plan  Continue with current plan of care       Recommendations  Diet recommendations: NPO;Other(comment)(ice chips after oral care) Medication Administration: Via alternative means                General recommendations: Other(comment)(TBD) Oral Care Recommendations: Oral care QID;Oral care prior to ice chip/H20 Follow up Recommendations: Other (comment)(TBD) SLP Visit Diagnosis: Dysphagia, unspecified (R13.10) Plan: Continue with current plan of care                       Elvina Sidle, M.S., CCC-SLP 10/01/2018, 9:10 AM

## 2018-10-01 NOTE — Progress Notes (Signed)
Patient ID: Kathleen [name], female   DOB: 1950-05-15, 68 y.o.   MRN: [id number]    Advanced Heart Failure Rounding Note   Subjective:     Extubated 6/3.  Remains on CVVH, weight down significantly again yesterday.  Some UOP but incontinent, hard to say how much.  She remains on NE at 6. CVP 5.   Awake. Follows commands but confused at times. Knows she is in the hospital today but tried to pull out IVs  On heparin for AF. No bleeding. She appears to be in a junctional rhythm rate 50s.   On cefazolin for MSSA in trach aspirate. Afebrile  Initial RHC on 5/29:  RA = 24 RV = 94/26 PA = 95/36 (53) PCW = 30 (v = 50) Fick cardiac output/index = 6.0/2.7 PVR =3.9 WU FA sat = 91% PA sat = 58%, 58% SVC 60%  Objective:   Weight Range:  Vital Signs:   Temp:  [98.6 F (37 C)-100.3 F (37.9 C)] 99.8 F (37.7 C) (06/06 0700) Pulse Rate:  [49-69] 52 (06/06 0800) Resp:  [15-26] 23 (06/06 0800) BP: (78-128)/(38-73) 104/46 (06/06 0800) SpO2:  [84 %-97 %] 94 % (06/06 0800) Weight:  [105.8 kg] 105.8 kg (06/06 0400) Last BM Date: 09/29/18  Weight change: Filed Weights   09/29/18 0500 09/30/18 0343 10/01/18 0400  Weight: 117.7 kg 112.5 kg 105.8 kg    Intake/Output:   Intake/Output Summary (Last 24 hours) at 10/01/2018 0819 Last data filed at 10/01/2018 0800 Gross per 24 hour  Intake 1743.4 ml  Output 4230 ml  Net -2486.6 ml     Physical Exam: General: NAD Neck: Thick, no JVD, no thyromegaly or thyroid nodule.  Lungs: Decreased BS at bases.  CV: Nondisplaced PMI.  Heart regular S1/S2, no S3/S4, no murmur.  No peripheral edema.   Abdomen: Soft, nontender, no hepatosplenomegaly, no distention.  Skin: Intact without lesions or rashes.  Neurologic: Alert, oriented to place.  Psych: Normal affect. Extremities: No clubbing or cyanosis.  HEENT: Normal.   Telemetry: Junctional rhythm 50s. Personally reviewed   Labs: Basic Metabolic Panel: Recent Labs  Lab 09/25/18 0400   09/26/18 0357 09/26/18 1552 09/27/18 0430  09/27/18 1516  09/29/18 0435 09/29/18 1700 09/30/18 0409 09/30/18 1600 10/01/18 0355  NA 139   < > 138 136 136   < >  --    < > 135 137 137 138 139  K 3.8   < > 4.9 4.7 4.5   < >  --    < > 4.2 4.6 4.5 4.3 4.1  CL 101   < > 104 102 102  --   --    < > 103 105 103 102 104  CO2 26   < > 25 26 25   --   --    < > 24 26 25 24 26   GLUCOSE 124*   < > 116* 169* 158*  --   --    < > 120* 101* 89 135* 190*  BUN 54*   < > 32* 31* 29*  --   --    < > 27* 26* 26* 27* 34*  CREATININE 2.41*   < > 1.96* 1.83* 1.87*  --   --    < > 1.83* 1.84* 2.02* 2.06* 2.18*  CALCIUM 8.3*   < > 8.2* 8.4* 8.5*  --   --    < > 8.6* 8.3* 8.5* 8.3* 8.3*  MG 2.3  --  2.3 2.5* 2.4  --  2.4  --   --   --   --   --   --   PHOS 3.1   < > 2.7 2.7 2.1*  --   --    < > 2.0* 2.7 2.0* 4.0 2.5   < > = values in this interval not displayed.    Liver Function Tests: Recent Labs  Lab 09/29/18 0435 09/29/18 1700 09/30/18 0409 09/30/18 1600 10/01/18 0355  ALBUMIN 2.6* 2.5* 2.6* 2.5* 2.5*   No results for input(s): LIPASE, AMYLASE in the last 168 hours. No results for input(s): AMMONIA in the last 168 hours.  CBC: Recent Labs  Lab 09/27/18 0430 09/27/18 0436 09/29/18 0435 09/30/18 0410 10/01/18 0355  WBC 6.9  --  6.0 4.9 5.5  HGB 10.1* 11.9* 9.6* 9.5* 10.2*  HCT 35.8* 35.0* 30.4* 31.5* 34.9*  MCV 88.4  --  92.1 88.7 85.7  PLT 150  --  131* 113* 111*    Cardiac Enzymes: No results for input(s): CKTOTAL, CKMB, CKMBINDEX, TROPONINI in the last 168 hours.  BNP: BNP (last 3 results) Recent Labs    09/22/18 1420  BNP 451.2*    ProBNP (last 3 results) No results for input(s): PROBNP in the last 8760 hours.    Other results:  Imaging: Dg Abd Portable 1v  Result Date: 09/30/2018 CLINICAL DATA:  NG tube placement. EXAM: PORTABLE ABDOMEN - 1 VIEW COMPARISON:  None. FINDINGS: The NG tube tip is in the antropyloric region of the stomach. The bowel gas pattern is  unremarkable. IMPRESSION: NG tube tip in the antropyloric region of the stomach. Electronically Signed   By: Marijo Sanes M.D.   On: 09/30/2018 13:14     Medications:     Scheduled Medications: . atorvastatin  40 mg Oral q1800  . B-complex with vitamin C  1 tablet Oral Daily  . chlorhexidine  15 mL Mouth Rinse BID  . Chlorhexidine Gluconate Cloth  6 each Topical Daily  . feeding supplement (PRO-STAT SUGAR FREE 64)  30 mL Per Tube TID  . feeding supplement (VITAL 1.5 CAL)  1,000 mL Per Tube Q24H  . gabapentin  100 mg Per Tube Q12H  . insulin aspart  0-20 Units Subcutaneous Q4H  . mouth rinse  15 mL Mouth Rinse q12n4p    Infusions: .  prismasol BGK 4/2.5 500 mL/hr at 10/01/18 0443  .  prismasol BGK 4/2.5 300 mL (10/01/18 0311)  . sodium chloride    .  ceFAZolin (ANCEF) IV Stopped (09/30/18 2246)  . famotidine (PEPCID) IV Stopped (09/30/18 1501)  . heparin Stopped (10/01/18 2585)  . norepinephrine (LEVOPHED) Adult infusion 6 mcg/min (10/01/18 0800)  . prismasol BGK 4/2.5 1,000 mL/hr (09/30/18 2247)    PRN Medications: Place/Maintain arterial line **AND** sodium chloride, fentaNYL (SUBLIMAZE) injection, ondansetron, sodium chloride flush   Assessment/Plan:   1. PEA arrest on 5/30 - due to hypoxia/pulmonary edema. - On CVVHD Continue to get volume off  2. Acute on chronic hypoxic respiratory failure - due to OHS/OSA  - intubated on 5/30 in setting of arrest.  - extubated 6/3.  - now with MSSA in trach aspirate. On cefazolin. - sleep study 4/15 without OSA (AHI 1.4) but had nocturnal desat - likely need to repeat at some point   3.  Acute on chronic diastolic HF - Admitted with markedly elevated filling pressures complicated by severe cardiorenal syndrome - Now improving with CVVHD with CVP down to 5.  - Volume status at baseline, CVVH changed today to  run even.  - Suspect she may progress to long-term HD but has had some UOP (incontinent).  - Remains on NE 6, ok to  wean for SBP > 95 and will add midodrine 5 mg tid.    4. Pulmonary HTN, severe  - this is mostly pulmonary venous HTN by cath [facility] 2) but also has a component of OHS/OSA (WHO group 3) - no role for selective pulmonary vasodilators - will improve significantly with volume removal - consider repeat RHC after volume removal   5. AKI on CKD 3 - baseline creatinine 1.9 -> ESRD - likely cardiorenal - We have suspected that she may progress to long-term HD. She has had some UOP but incontinent.  Need to get Purewick in place. Renal following.  6. Morbid obesity - desperately needs weight loss  6. Chronic AF/AFL - Off apixaban. Now on heparin with CVVHD. No bleeding.Discussed dosing with PharmD personally. - Rhythm looks junctional currently  7. VT/NSVT - off amio due to bradycardia - Keep K> 4.0 Mg 2.0  8. Fever with MSSA PNA - Bcx drawn 6/2. Negative - sputum cx with MSSA PNA - on cefazolin   Will continue to follow while in ICU but when transfers out of unit should go to [facility],.   Length of Stay: 9  Loralie Champagne MD 10/01/2018, 8:19 AM  Advanced Heart Failure Team Pager [phone] (M-F; 7a - 4p)  Please contact [facility] for night-coverage after hours (4p -7a ) and weekends on [email or web address]

## 2018-10-01 NOTE — Progress Notes (Signed)
Assisted tele visit to patient with son.  Gracen Southwell M, RN  

## 2018-10-01 NOTE — Evaluation (Signed)
Physical Therapy Evaluation Patient Details Name: Kathleen [name] MRN: [id number] DOB: 1951-03-13 Today's Date: 10/01/2018   History of Present Illness  Kathleen [name] is a 68 y.o. female with history of permanent AF, hypertension, super morbid obesity, DM2, dyslipidemia, obesity hypoventilation syndrome (on home O2 with no OSA by prior PSG but showed nocturnal hypoxemia), CKD stage IV (baseline creatinine about 1.9), chronic diastolic CHF.  Admit for fluid overload. Also with pulmonary HTN.  PEA arrest 5/30, transfer to the ICU, extubater 6/3.  Clinical Impression  Pt admitted with/for fluid overload/pulm HTN, but she suffered PEA arrest in house.  Pt now needing max to total assist on eval..  Pt currently limited functionally due to the problems listed. ( See problems list.)   Pt will benefit from PT to maximize function and safety in order to get ready for next venue listed below.     Follow Up Recommendations CIR    Equipment Recommendations  Other (comment)(TBA)    Recommendations for Other Services Rehab consult     Precautions / Restrictions Precautions Precautions: Fall Restrictions Weight Bearing Restrictions: No      Mobility  Bed Mobility Overal bed mobility: Needs Assistance Bed Mobility: Supine to Sit;Sit to Supine     Supine to sit: Max assist;+2 for safety/equipment Sit to supine: Total assist;+2 for physical assistance   General bed mobility comments: pt initiated very little assist with trunk or UE's.  Total assist scooting with pad to EOB  Pt was very anxious moving toward the EOB  Transfers                 General transfer comment: pt was very anxiou at EOB and kept shaking her head NO to getting her feet fully on the floor'  Ambulation/Gait                Stairs            Wheelchair Mobility    Modified Rankin (Stroke Patients Only)       Balance Overall balance assessment: Needs assistance Sitting-balance support: Single  extremity supported;No upper extremity supported Sitting balance-Leahy Scale: Poor Sitting balance - Comments: pt took sore and too confused to assist with UE's at EOB.  Pt listing posteriorly thoughout the 5 minutes sitting EOB.  Pt never relaxed.                                     Pertinent Vitals/Pain Pain Descriptors / Indicators: Grimacing Pain Intervention(s): Monitored during session    Home Living Family/patient expects to be discharged to:: Private residence Living Arrangements: Alone Available Help at Discharge: Family;Available PRN/intermittently Type of Home: Mobile home Home Access: Stairs to enter   Entrance Stairs-Number of Steps: long ramp type entry with 15 steps with walking space in between them Home Layout: One level Home Equipment: Cane - single point;Grab bars - tub/shower;Walker - 4 wheels      Prior Function Level of Independence: Independent with assistive device(s);Needs assistance   Gait / Transfers Assistance Needed: used cane PTA.  Niece recently bought pt a rollator  ADL's / Homemaking Assistance Needed: Did own ADLS, sister assisted with grocery shopping        Hand Dominance   Dominant Hand: Right    Extremity/Trunk Assessment   Upper Extremity Assessment Upper Extremity Assessment: Generalized weakness    Lower Extremity Assessment Lower Extremity Assessment: Generalized weakness(increased stiffness and  decrease ROM)       Communication      Cognition Arousal/Alertness: Awake/alert Behavior During Therapy: WFL for tasks assessed/performed Overall Cognitive Status: (NT formally, but confused)                                        General Comments General comments (skin integrity, edema, etc.): pt's session limited by anxiety and by being on CRRT    Exercises     Assessment/Plan    PT Assessment Patient needs continued PT services  PT Problem List Decreased activity tolerance;Decreased  strength;Decreased balance;Decreased mobility;Cardiopulmonary status limiting activity       PT Treatment Interventions DME instruction;Gait training;Functional mobility training;Therapeutic activities;Therapeutic exercise;Balance training;Patient/family education    PT Goals (Current goals can be found in the Care Plan section)  Acute Rehab PT Goals Patient Stated Goal: to go home PT Goal Formulation: With patient Time For Goal Achievement: 10/21/18 Potential to Achieve Goals: Good    Frequency Min 3X/week   Barriers to discharge        Co-evaluation               AM-PAC PT "6 Clicks" Mobility  Outcome Measure Help needed turning from your back to your side while in a flat bed without using bedrails?: Total Help needed moving from lying on your back to sitting on the side of a flat bed without using bedrails?: Total Help needed moving to and from a bed to a chair (including a wheelchair)?: Total Help needed standing up from a chair using your arms (e.g., wheelchair or bedside chair)?: Total Help needed to walk in hospital room?: Total Help needed climbing 3-5 steps with a railing? : Total 6 Click Score: 6    End of Session Equipment Utilized During Treatment: Oxygen Activity Tolerance: Patient limited by fatigue Patient left: in bed;with call bell/phone within reach;with bed alarm set Nurse Communication: Mobility status;Need for lift equipment PT Visit Diagnosis: Muscle weakness (generalized) (M62.81);Other abnormalities of gait and mobility (R26.89)    Time: 7619-5093 PT Time Calculation (min) (ACUTE ONLY): 18 min   Charges:   PT Evaluation $PT Re-evaluation: 1 Re-eval          10/01/2018  Donnella Sham, PT Acute Rehabilitation Services [phone]  (pager) [phone]  (office)  Tessie Fass Chares Slaymaker 10/01/2018, 5:10 PM

## 2018-10-02 ENCOUNTER — Inpatient Hospital Stay (HOSPITAL_COMMUNITY): Payer: Medicare HMO

## 2018-10-02 LAB — RENAL FUNCTION PANEL
Albumin: 2.2 g/dL — ABNORMAL LOW (ref 3.5–5.0)
Albumin: 2.2 g/dL — ABNORMAL LOW (ref 3.5–5.0)
Anion gap: 10 (ref 5–15)
Anion gap: 11 (ref 5–15)
BUN: 40 mg/dL — ABNORMAL HIGH (ref 8–23)
BUN: 40 mg/dL — ABNORMAL HIGH (ref 8–23)
CO2: 27 mmol/L (ref 22–32)
CO2: 26 mmol/L (ref 22–32)
Calcium: 8.3 mg/dL — ABNORMAL LOW (ref 8.9–10.3)
Calcium: 8.2 mg/dL — ABNORMAL LOW (ref 8.9–10.3)
Chloride: 102 mmol/L (ref 98–111)
Chloride: 100 mmol/L (ref 98–111)
Creatinine, Ser: 2.12 mg/dL — ABNORMAL HIGH (ref 0.44–1.00)
Creatinine, Ser: 2.16 mg/dL — ABNORMAL HIGH (ref 0.44–1.00)
GFR calc Af Amer: 27 mL/min — ABNORMAL LOW (ref >60)
GFR calc Af Amer: 27 mL/min — ABNORMAL LOW (ref >60)
GFR calc non Af Amer: 24 mL/min — ABNORMAL LOW (ref >60)
GFR calc non Af Amer: 23 mL/min — ABNORMAL LOW (ref >60)
Glucose, Bld: 215 mg/dL — ABNORMAL HIGH (ref 70–99)
Glucose, Bld: 194 mg/dL — ABNORMAL HIGH (ref 70–99)
Phosphorus: 2.9 mg/dL (ref 2.5–4.6)
Phosphorus: 2.4 mg/dL — ABNORMAL LOW (ref 2.5–4.6)
Potassium: 4.4 mmol/L (ref 3.5–5.1)
Potassium: 4.7 mmol/L (ref 3.5–5.1)
Sodium: 139 mmol/L (ref 135–145)
Sodium: 137 mmol/L (ref 135–145)

## 2018-10-02 LAB — MAGNESIUM: Magnesium: 2.6 mg/dL — ABNORMAL HIGH (ref 1.7–2.4)

## 2018-10-02 LAB — CBC WITH DIFFERENTIAL/PLATELET
Abs Immature Granulocytes: 0.1 10*3/uL — ABNORMAL HIGH (ref 0.00–0.07)
Basophils Absolute: 0 10*3/uL (ref 0.0–0.1)
Basophils Relative: 0 %
Eosinophils Absolute: 0.1 10*3/uL (ref 0.0–0.5)
Eosinophils Relative: 2 %
HCT: 37.4 % (ref 36.0–46.0)
Hemoglobin: 10.8 g/dL — ABNORMAL LOW (ref 12.0–15.0)
Immature Granulocytes: 2 %
Lymphocytes Relative: 31 %
Lymphs Abs: 1.6 10*3/uL (ref 0.7–4.0)
MCH: 24.5 pg — ABNORMAL LOW (ref 26.0–34.0)
MCHC: 28.9 g/dL — ABNORMAL LOW (ref 30.0–36.0)
MCV: 84.8 fL (ref 80.0–100.0)
Monocytes Absolute: 0.6 10*3/uL (ref 0.1–1.0)
Monocytes Relative: 13 %
Neutro Abs: 2.6 10*3/uL (ref 1.7–7.7)
Neutrophils Relative %: 52 %
Platelets: 78 10*3/uL — ABNORMAL LOW (ref 150–400)
RBC: 4.41 MIL/uL (ref 3.87–5.11)
RDW: 19.4 % — ABNORMAL HIGH (ref 11.5–15.5)
WBC: 5 10*3/uL (ref 4.0–10.5)
nRBC: 0.6 % — ABNORMAL HIGH (ref 0.0–0.2)

## 2018-10-02 LAB — CULTURE, BLOOD (ROUTINE X 2)
Culture: NO GROWTH
Culture: NO GROWTH
Special Requests: ADEQUATE

## 2018-10-02 LAB — APTT: aPTT: 90 seconds — ABNORMAL HIGH (ref 24–36)

## 2018-10-02 LAB — GLUCOSE, CAPILLARY
Glucose-Capillary: 191 mg/dL — ABNORMAL HIGH (ref 70–99)
Glucose-Capillary: 213 mg/dL — ABNORMAL HIGH (ref 70–99)
Glucose-Capillary: 175 mg/dL — ABNORMAL HIGH (ref 70–99)
Glucose-Capillary: 183 mg/dL — ABNORMAL HIGH (ref 70–99)

## 2018-10-02 LAB — HEPARIN LEVEL (UNFRACTIONATED): Heparin Unfractionated: 0.7 IU/mL (ref 0.30–0.70)

## 2018-10-02 MED ORDER — MIDODRINE HCL 5 MG PO TABS
10.0000 mg | ORAL_TABLET | Freq: Three times a day (TID) | ORAL | Status: DC
  Administered 2018-10-02 – 2018-10-16 (×41): 10 mg via ORAL
  Filled 2018-10-02 (×41): qty 2

## 2018-10-02 NOTE — Progress Notes (Signed)
Assisted tele visit to patient with son.  Kathleen [name] M, RN  

## 2018-10-02 NOTE — Progress Notes (Addendum)
[place] for Heparin Indication: atrial fibrillation  Allergies  Allergen Reactions  . Doxycycline     REACTION: Wheals and pruritus    Patient Measurements: Weight: 236 lb 15.9 oz (107.5 kg)  Vital Signs: Temp: 97.8 F (36.6 C) (06/07 0400) Temp Source: Oral (06/07 0400) BP: 109/55 (06/07 0700) Pulse Rate: 53 (06/07 0700)  Labs: Recent Labs    09/30/18 0410  10/01/18 0355 10/01/18 1623 10/02/18 0411  HGB 9.5*  --  10.2*  --  10.8*  HCT 31.5*  --  34.9*  --  37.4  PLT 113*  --  111*  --  78*  APTT 86*  --  91*  --  90*  HEPARINUNFRC 0.97*  --  0.83*  --  0.70  CREATININE  --    < > 2.18* 2.21* 2.12*   < > = values in this interval not displayed.    Estimated Creatinine Clearance: 29 mL/min (A) (by C-G formula based on SCr of 2.12 mg/dL (H)).  Assessment: Pt is a 67yoF with a history of afib. She was on Eliquis PTA, received in hospital, last dose 5/29. Apixaban on hold for potential need for [facility], currently on CRRT. Pharmacy consulted to start heparin. Eliquis interferes with HL monitoring, will monitor aPTT until correlating.  APTT therapeutic at 9, on heparin 900 units/hr running peripherally. Heparin level starting to correlate and likely accurate at 0.7. Hgb stable at 10.8, plt 78(down). No signs/symptoms of bleeding or issues with infusion reported by nursing.   Goal of Therapy:  Heparin level 0.3-0.7 units/ml  APTT 66-102 Monitor platelets by anticoagulation protocol: Yes  Plan:  Continue heparin at 800 units/hr APTT and heparin level daily Monitor s/sx of bleeding  [name] PharmD., BCPS Clinical Pharmacist 10/02/2018 7:55 AM

## 2018-10-02 NOTE — Progress Notes (Signed)
Subjective:   pressors still on, no or minimal UOP-  - CVP much better- ran even with CRRT last 24 hours   Objective Vital signs in last 24 hours: Vitals:   10/02/18 0530 10/02/18 0600 10/02/18 0602 10/02/18 0608  BP: (!) 98/59  103/63   Pulse: (!) 52 (!) 53  (!) 53  Resp: (!) 21 18  17   Temp:      TempSrc:      SpO2: 95% 94%  95%  Weight:       Weight change: 1.7 kg  Intake/Output Summary (Last 24 hours) at 10/02/2018 3536 Last data filed at 10/02/2018 0600 Gross per 24 hour  Intake 1817.49 ml  Output 1933 ml  Net -115.51 ml    Assessment/ Plan: Pt is a 68 y.o. yo female HTN, Afib, DM, morbid obesity, diastolic heart failure and also baseline CKD- crt mid to high 1's who was admitted on 09/22/2018 with volume overload, then suffered cardiac arrest  Assessment/Plan: 1. Renal- A on CRF after cardiac arrest on 5/30.  Baseline crt in the mid to high 1's.   Required initiation of CRRT early on 5/30- overall negative fluid balance on CRRT- almost 20 liters.  now  running even.  No uop so will cont CRRT for now while still on pressors.  Since had incont episode tried to collect with purewick, not successful  - would be hopeful for eventual recovery but no signs yet   2. HTN/volume-  Tolerated UF so far- CVP much better - still with relatively high o2 -  running even  3. Anemia- hgb close to 10 - no meds yet 4. [place]-  K  OK for now - phos OK    [name] Meckel    Labs: Basic Metabolic Panel: Recent Labs  Lab 10/01/18 0355 10/01/18 1623 10/02/18 0411  NA 139 138 139  K 4.1 4.2 4.4  CL 104 103 102  CO2 26 26 27   GLUCOSE 190* 198* 215*  BUN 34* 36* 40*  CREATININE 2.18* 2.21* 2.12*  CALCIUM 8.3* 8.1* 8.3*  PHOS 2.5 2.5 2.9   Liver Function Tests: Recent Labs  Lab 10/01/18 0355 10/01/18 1623 10/02/18 0411  ALBUMIN 2.5* 2.3* 2.2*   No results for input(s): LIPASE, AMYLASE in the last 168 hours. No results for input(s): AMMONIA in the last 168 hours. CBC: Recent  Labs  Lab 09/27/18 0430  09/29/18 0435 09/30/18 0410 10/01/18 0355 10/02/18 0411  WBC 6.9  --  6.0 4.9 5.5 5.0  NEUTROABS  --   --   --   --   --  2.6  HGB 10.1*   < > 9.6* 9.5* 10.2* 10.8*  HCT 35.8*   < > 30.4* 31.5* 34.9* 37.4  MCV 88.4  --  92.1 88.7 85.7 84.8  PLT 150  --  131* 113* 111* 78*   < > = values in this interval not displayed.   Cardiac Enzymes: No results for input(s): CKTOTAL, CKMB, CKMBINDEX, TROPONINI in the last 168 hours. CBG: Recent Labs  Lab 10/01/18 0748 10/01/18 1134 10/01/18 1616 10/01/18 1949 10/01/18 2358  GLUCAP 186* 218* 171* 166* 218*    Iron Studies: No results for input(s): IRON, TIBC, TRANSFERRIN, FERRITIN in the last 72 hours. Studies/Results: Dg Abd Portable 1v  Result Date: 09/30/2018 CLINICAL DATA:  NG tube placement. EXAM: PORTABLE ABDOMEN - 1 VIEW COMPARISON:  None. FINDINGS: The NG tube tip is in the antropyloric region of the stomach. The bowel gas pattern is  unremarkable. IMPRESSION: NG tube tip in the antropyloric region of the stomach. Electronically Signed   By: Marijo Sanes M.D.   On: 09/30/2018 13:14   Medications: Infusions: .  prismasol BGK 4/2.5 500 mL/hr at 10/02/18 0121  .  prismasol BGK 4/2.5 300 mL/hr at 10/01/18 2025  . sodium chloride    .  ceFAZolin (ANCEF) IV Stopped (10/01/18 2253)  . famotidine (PEPCID) IV Stopped (10/01/18 1539)  . heparin 800 Units/hr (10/02/18 0600)  . norepinephrine (LEVOPHED) Adult infusion 10 mcg/min (10/02/18 0600)  . prismasol BGK 4/2.5 1,000 mL/hr at 10/02/18 0545    Scheduled Medications: . atorvastatin  40 mg Oral q1800  . B-complex with vitamin C  1 tablet Oral Daily  . chlorhexidine  15 mL Mouth Rinse BID  . Chlorhexidine Gluconate Cloth  6 each Topical Daily  . feeding supplement (PRO-STAT SUGAR FREE 64)  30 mL Per Tube TID  . feeding supplement (VITAL 1.5 CAL)  1,000 mL Per Tube Q24H  . gabapentin  100 mg Per Tube Q12H  . insulin aspart  0-20 Units Subcutaneous Q4H  .  mouth rinse  15 mL Mouth Rinse q12n4p  . midodrine  5 mg Oral TID WC    have reviewed scheduled and prn medications.  Physical Exam: General: obese, more alert today but confused-  still in restraints  Heart: brady  Lungs: CBS bilat Abdomen: obese, non tender Extremities: edema better but still present  Dialysis Access: right IJ vascath placed 5/30     10/02/2018,6:53 AM  LOS: 10 days

## 2018-10-02 NOTE — Progress Notes (Signed)
Patient ID: Kathleen [name], female   DOB: 1951-02-21, 68 y.o.   MRN: [id number]    Advanced Heart Failure Rounding Note   Subjective:     Extubated 6/3.  Remains on CVVH, now running even.  Just turned off NE.  CVP 5-6.  No UOP over the last day.   More alert/oriented today.   On heparin for AF. No bleeding. She appears to be in a junctional rhythm rate 50s.   On cefazolin for MSSA in trach aspirate. Afebrile, rhonchorous. Requiring 7L oxygen.   Initial RHC on 5/29: RA = 24 RV = 94/26 PA = 95/36 (53) PCW = 30 (v = 50) Fick cardiac output/index = 6.0/2.7 PVR =3.9 WU FA sat = 91% PA sat = 58%, 58% SVC 60%  Objective:   Weight Range:  Vital Signs:   Temp:  [97.8 F (36.6 C)-98.9 F (37.2 C)] 97.8 F (36.6 C) (06/07 0800) Pulse Rate:  [47-57] 54 (06/07 0800) Resp:  [16-23] 20 (06/07 0800) BP: (85-110)/(36-84) 106/49 (06/07 0800) SpO2:  [85 %-97 %] 92 % (06/07 0800) Weight:  [107.5 kg] 107.5 kg (06/07 0400) Last BM Date: 10/01/18  Weight change: Filed Weights   09/30/18 0343 10/01/18 0400 10/02/18 0400  Weight: 112.5 kg 105.8 kg 107.5 kg    Intake/Output:   Intake/Output Summary (Last 24 hours) at 10/02/2018 0844 Last data filed at 10/02/2018 0801 Gross per 24 hour  Intake 1855.56 ml  Output 1713 ml  Net 142.56 ml     Physical Exam: General: NAD Neck: No JVD, no thyromegaly or thyroid nodule.  Lungs: Bilateral rhonchi CV: Nondisplaced PMI.  Heart regular S1/S2, no S3/S4, no murmur.  No peripheral edema.   Abdomen: Soft, nontender, no hepatosplenomegaly, no distention.  Skin: Intact without lesions or rashes.  Neurologic: Alert and oriented x 3.  Psych: Normal affect. Extremities: No clubbing or cyanosis.  HEENT: Normal.    Telemetry: Junctional rhythm 50s. Personally reviewed   Labs: Basic Metabolic Panel: Recent Labs  Lab 09/26/18 0357 09/26/18 1552 09/27/18 0430  09/27/18 1516  09/30/18 0409 09/30/18 1600 10/01/18 0355 10/01/18 1623  10/02/18 0411  NA 138 136 136   < >  --    < > 137 138 139 138 139  K 4.9 4.7 4.5   < >  --    < > 4.5 4.3 4.1 4.2 4.4  CL 104 102 102  --   --    < > 103 102 104 103 102  CO2 25 26 25   --   --    < > 25 24 26 26 27   GLUCOSE 116* 169* 158*  --   --    < > 89 135* 190* 198* 215*  BUN 32* 31* 29*  --   --    < > 26* 27* 34* 36* 40*  CREATININE 1.96* 1.83* 1.87*  --   --    < > 2.02* 2.06* 2.18* 2.21* 2.12*  CALCIUM 8.2* 8.4* 8.5*  --   --    < > 8.5* 8.3* 8.3* 8.1* 8.3*  MG 2.3 2.5* 2.4  --  2.4  --   --   --   --   --  2.6*  PHOS 2.7 2.7 2.1*  --   --    < > 2.0* 4.0 2.5 2.5 2.9   < > = values in this interval not displayed.    Liver Function Tests: Recent Labs  Lab 09/30/18 0409 09/30/18 1600 10/01/18 0355 10/01/18  1623 10/02/18 0411  ALBUMIN 2.6* 2.5* 2.5* 2.3* 2.2*   No results for input(s): LIPASE, AMYLASE in the last 168 hours. No results for input(s): AMMONIA in the last 168 hours.  CBC: Recent Labs  Lab 09/27/18 0430 09/27/18 0436 09/29/18 0435 09/30/18 0410 10/01/18 0355 10/02/18 0411  WBC 6.9  --  6.0 4.9 5.5 5.0  NEUTROABS  --   --   --   --   --  2.6  HGB 10.1* 11.9* 9.6* 9.5* 10.2* 10.8*  HCT 35.8* 35.0* 30.4* 31.5* 34.9* 37.4  MCV 88.4  --  92.1 88.7 85.7 84.8  PLT 150  --  131* 113* 111* 78*    Cardiac Enzymes: No results for input(s): CKTOTAL, CKMB, CKMBINDEX, TROPONINI in the last 168 hours.  BNP: BNP (last 3 results) Recent Labs    09/22/18 1420  BNP 451.2*    ProBNP (last 3 results) No results for input(s): PROBNP in the last 8760 hours.    Other results:  Imaging: Dg Chest Port 1 View  Result Date: 10/02/2018 CLINICAL DATA:  Respiratory failure EXAM: PORTABLE CHEST 1 VIEW COMPARISON:  Chest radiograph 09/29/2018 FINDINGS: Right IJ central venous catheter tip projects over the superior vena cava. Enteric tube courses inferior to the diaphragm. Left upper extremity PICC line tip projects over the superior vena cava. Monitoring leads  overlie the patient. Stable cardiomegaly. Similar-appearing diffuse bilateral heterogeneous pulmonary opacities. No definite pleural effusion or pneumothorax. IMPRESSION: Cardiomegaly and pulmonary edema. Electronically Signed   By: Lovey Newcomer M.D.   On: 10/02/2018 08:13   Dg Abd Portable 1v  Result Date: 09/30/2018 CLINICAL DATA:  NG tube placement. EXAM: PORTABLE ABDOMEN - 1 VIEW COMPARISON:  None. FINDINGS: The NG tube tip is in the antropyloric region of the stomach. The bowel gas pattern is unremarkable. IMPRESSION: NG tube tip in the antropyloric region of the stomach. Electronically Signed   By: Marijo Sanes M.D.   On: 09/30/2018 13:14     Medications:     Scheduled Medications:  atorvastatin  40 mg Oral q1800   B-complex with vitamin C  1 tablet Oral Daily   chlorhexidine  15 mL Mouth Rinse BID   Chlorhexidine Gluconate Cloth  6 each Topical Daily   feeding supplement (PRO-STAT SUGAR FREE 64)  30 mL Per Tube TID   feeding supplement (VITAL 1.5 CAL)  1,000 mL Per Tube Q24H   gabapentin  100 mg Per Tube Q12H   insulin aspart  0-20 Units Subcutaneous Q4H   mouth rinse  15 mL Mouth Rinse q12n4p   midodrine  10 mg Oral TID WC    Infusions:   prismasol BGK 4/2.5 500 mL/hr at 10/02/18 0121    prismasol BGK 4/2.5 300 mL/hr at 10/01/18 2025   sodium chloride      ceFAZolin (ANCEF) IV Stopped (10/01/18 2253)   famotidine (PEPCID) IV Stopped (10/01/18 1539)   heparin 800 Units/hr (10/02/18 0800)   norepinephrine (LEVOPHED) Adult infusion 4 mcg/min (10/02/18 0800)   prismasol BGK 4/2.5 1,000 mL/hr at 10/02/18 0545    PRN Medications: Place/Maintain arterial line **AND** sodium chloride, fentaNYL (SUBLIMAZE) injection, ondansetron, risperiDONE, sodium chloride flush   Assessment/Plan:   1. PEA arrest on 5/30 - due to hypoxia/pulmonary edema.  2. Acute on chronic hypoxic respiratory failure - due to OHS/OSA  - intubated on 5/30 in setting of arrest.  -  extubated 6/3.  - now with MSSA in trach aspirate, suspect PNA. On cefazolin. - Rhonchorous, requiring 7L  oxygen.  Would use flutter valve and do chest PT.  - sleep study 4/15 without OSA (AHI 1.4) but had nocturnal desat - likely need to repeat at some point   3.  Acute on chronic diastolic HF - Admitted with markedly elevated filling pressures complicated by severe cardiorenal syndrome - Now improving with CVVHD with CVP down to 5.  - Volume status at baseline, CVVH running even.   - Suspect she may progress to long-term HD, no UOP over the last day.  - Now off NE, can increase midodrine to 10 mg tid for now.     4. Pulmonary HTN, severe  - this is mostly pulmonary venous HTN by cath [facility]) but also has a component of OHS/OSA (WHO group 3) - no role for selective pulmonary vasodilators - will improve significantly with volume removal - consider repeat RHC after volume removal   5. AKI on CKD 3 - baseline creatinine 1.9 -> ESRD - likely cardiorenal - We have suspected that she may progress to long-term HD. No UOP.  - If she can stay off norepinephrine today, suspect we can stop CVVH and move her to intermittent HD.   6. Morbid obesity - desperately needs weight loss  6. Chronic AF/AFL - Off apixaban. Now on heparin with CVVHD. No bleeding.Discussed dosing with PharmD personally. - Rhythm looks junctional currently (stable)  7. VT/NSVT - off amio due to bradycardia - Keep K> 4.0 Mg 2.0  8. Fever with MSSA PNA - Bcx drawn 6/2. Negative - sputum cx with MSSA PNA - on cefazolin   Will continue to follow while in ICU but when transfers out of unit should go to [facility].   Length of Stay: 10  Loralie Champagne MD 10/02/2018, 8:44 AM  Advanced Heart Failure Team Pager [phone] (M-F; 7a - 4p)  Please contact [facility] for night-coverage after hours (4p -7a ) and weekends on [email or web address]

## 2018-10-02 NOTE — Progress Notes (Signed)
Rehab Admissions Coordinator Note:  Patient was screened by Michel Santee for appropriateness for an Inpatient Acute Rehab Consult.  Note pt still on CVVHD.  Will continue to follow at a distance until pt more medically stable to determine most appropriate post-acute venue of care.   Shann Medal, PT, DPT Admissions Coordinator [phone] 10/02/18  12:14 PM

## 2018-10-02 NOTE — Progress Notes (Signed)
  Speech Language Pathology Treatment: Dysphagia  Patient Details Name: Kathleen [name] MRN: [id number] DOB: 1950-10-02 Today's Date: 10/02/2018 Time: 4481-8563 SLP Time Calculation (min) (ACUTE ONLY): 15 min  Assessment / Plan / Recommendation Clinical Impression  SLP followed up for PO trials/readiness. Pt alert, upright at bedside. Pt with persistent lingual coating despite oral care, notified RN to relay to physician. Pt with immediate cough following small cup sip of water concerning for poor airway protection. Cough strength remains weak. Recommend FEES to further assess aspiration risk. FEES to be completed as scheduling allows.   HPI HPI: 68 y.o. female admitted 09/22/2018 for surgery. PMH: permanent atrial flutter with baseline bradycardia, hypertension, super morbid obesity, IIDDM, dyslipidemia, obesity hypoventilation syndrome, CKD stage IV, chronic diastolic CHF, chronic edema. Intubated 5/30-09/28/2018. PEA 09/24/2018. CXR = increase edema pattern with some confluence of infiltrate in the right lower lobe      SLP Plan  Continue with current plan of care;Other (Comment)(FEES)       Recommendations  Diet recommendations: NPO Medication Administration: Via alternative means                Oral Care Recommendations: Oral care QID;Oral care prior to ice chip/H20 SLP Visit Diagnosis: Dysphagia, unspecified (R13.10) Plan: Continue with current plan of care;Other (Comment)(FEES)       [place], CCC-SLP Acute Rehabilitation Services  10/02/2018, 9:57 AM

## 2018-10-03 ENCOUNTER — Inpatient Hospital Stay (HOSPITAL_COMMUNITY): Payer: Medicare HMO

## 2018-10-03 ENCOUNTER — Encounter (HOSPITAL_COMMUNITY): Payer: Self-pay | Admitting: Interventional Radiology

## 2018-10-03 HISTORY — PX: IR US GUIDE VASC ACCESS RIGHT: IMG2390

## 2018-10-03 HISTORY — PX: IR FLUORO GUIDE CV LINE RIGHT: IMG2283

## 2018-10-03 LAB — CBC
HCT: 35.4 % — ABNORMAL LOW (ref 36.0–46.0)
Hemoglobin: 10.3 g/dL — ABNORMAL LOW (ref 12.0–15.0)
MCH: 24.9 pg — ABNORMAL LOW (ref 26.0–34.0)
MCHC: 29.1 g/dL — ABNORMAL LOW (ref 30.0–36.0)
MCV: 85.5 fL (ref 80.0–100.0)
Platelets: 73 10*3/uL — ABNORMAL LOW (ref 150–400)
RBC: 4.14 MIL/uL (ref 3.87–5.11)
RDW: 19.8 % — ABNORMAL HIGH (ref 11.5–15.5)
WBC: 4.6 10*3/uL (ref 4.0–10.5)
nRBC: 0.9 % — ABNORMAL HIGH (ref 0.0–0.2)

## 2018-10-03 LAB — RENAL FUNCTION PANEL
Albumin: 2.2 g/dL — ABNORMAL LOW (ref 3.5–5.0)
Albumin: 2.1 g/dL — ABNORMAL LOW (ref 3.5–5.0)
Anion gap: 6 (ref 5–15)
Anion gap: 10 (ref 5–15)
BUN: 39 mg/dL — ABNORMAL HIGH (ref 8–23)
BUN: 38 mg/dL — ABNORMAL HIGH (ref 8–23)
CO2: 27 mmol/L (ref 22–32)
CO2: 24 mmol/L (ref 22–32)
Calcium: 8 mg/dL — ABNORMAL LOW (ref 8.9–10.3)
Calcium: 7.7 mg/dL — ABNORMAL LOW (ref 8.9–10.3)
Chloride: 102 mmol/L (ref 98–111)
Chloride: 103 mmol/L (ref 98–111)
Creatinine, Ser: 2.1 mg/dL — ABNORMAL HIGH (ref 0.44–1.00)
Creatinine, Ser: 2.22 mg/dL — ABNORMAL HIGH (ref 0.44–1.00)
GFR calc Af Amer: 28 mL/min — ABNORMAL LOW (ref >60)
GFR calc Af Amer: 26 mL/min — ABNORMAL LOW (ref >60)
GFR calc non Af Amer: 24 mL/min — ABNORMAL LOW (ref >60)
GFR calc non Af Amer: 22 mL/min — ABNORMAL LOW (ref >60)
Glucose, Bld: 206 mg/dL — ABNORMAL HIGH (ref 70–99)
Glucose, Bld: 135 mg/dL — ABNORMAL HIGH (ref 70–99)
Phosphorus: 1.8 mg/dL — ABNORMAL LOW (ref 2.5–4.6)
Phosphorus: 2 mg/dL — ABNORMAL LOW (ref 2.5–4.6)
Potassium: 4.7 mmol/L (ref 3.5–5.1)
Potassium: 4.5 mmol/L (ref 3.5–5.1)
Sodium: 135 mmol/L (ref 135–145)
Sodium: 137 mmol/L (ref 135–145)

## 2018-10-03 LAB — GLUCOSE, CAPILLARY
Glucose-Capillary: 115 mg/dL — ABNORMAL HIGH (ref 70–99)
Glucose-Capillary: 154 mg/dL — ABNORMAL HIGH (ref 70–99)
Glucose-Capillary: 169 mg/dL — ABNORMAL HIGH (ref 70–99)
Glucose-Capillary: 188 mg/dL — ABNORMAL HIGH (ref 70–99)
Glucose-Capillary: 203 mg/dL — ABNORMAL HIGH (ref 70–99)
Glucose-Capillary: 205 mg/dL — ABNORMAL HIGH (ref 70–99)
Glucose-Capillary: 237 mg/dL — ABNORMAL HIGH (ref 70–99)

## 2018-10-03 LAB — HEPARIN LEVEL (UNFRACTIONATED): Heparin Unfractionated: 0.55 IU/mL (ref 0.30–0.70)

## 2018-10-03 LAB — PROTIME-INR
INR: 1.1 (ref 0.8–1.2)
Prothrombin Time: 14.5 seconds (ref 11.4–15.2)

## 2018-10-03 LAB — APTT: aPTT: 86 seconds — ABNORMAL HIGH (ref 24–36)

## 2018-10-03 MED ORDER — GELATIN ABSORBABLE 12-7 MM EX MISC
CUTANEOUS | Status: AC
  Filled 2018-10-03: qty 1

## 2018-10-03 MED ORDER — LIDOCAINE HCL 1 % IJ SOLN
INTRAMUSCULAR | Status: AC
  Filled 2018-10-03: qty 20

## 2018-10-03 MED ORDER — HEPARIN SODIUM (PORCINE) 1000 UNIT/ML IJ SOLN
INTRAMUSCULAR | Status: AC
  Administered 2018-10-03: 16:00:00 3.8 mL
  Filled 2018-10-03: qty 1

## 2018-10-03 MED ORDER — MIDAZOLAM HCL 2 MG/2ML IJ SOLN
INTRAMUSCULAR | Status: AC
  Filled 2018-10-03: qty 2

## 2018-10-03 MED ORDER — HEPARIN SODIUM (PORCINE) 1000 UNIT/ML DIALYSIS
1000.0000 [IU] | INTRAMUSCULAR | Status: DC | PRN
  Administered 2018-10-03: 2800 [IU] via INTRAVENOUS_CENTRAL
  Administered 2018-10-05: 3800 [IU] via INTRAVENOUS_CENTRAL
  Filled 2018-10-03: qty 6
  Filled 2018-10-03: qty 3
  Filled 2018-10-03 (×2): qty 6
  Filled 2018-10-03: qty 4

## 2018-10-03 MED ORDER — LIDOCAINE HCL (PF) 1 % IJ SOLN
INTRAMUSCULAR | Status: AC | PRN
  Administered 2018-10-03: 10 mL

## 2018-10-03 MED ORDER — LIDOCAINE-EPINEPHRINE (PF) 1 %-1:200000 IJ SOLN
INTRAMUSCULAR | Status: AC
  Filled 2018-10-03: qty 30

## 2018-10-03 MED ORDER — FENTANYL CITRATE (PF) 100 MCG/2ML IJ SOLN
INTRAMUSCULAR | Status: AC
  Filled 2018-10-03: qty 2

## 2018-10-03 MED ORDER — CHLORHEXIDINE GLUCONATE 4 % EX LIQD
CUTANEOUS | Status: AC
  Filled 2018-10-03: qty 15

## 2018-10-03 MED ORDER — FENTANYL CITRATE (PF) 100 MCG/2ML IJ SOLN
INTRAMUSCULAR | Status: AC | PRN
  Administered 2018-10-03: 25 ug via INTRAVENOUS

## 2018-10-03 MED ORDER — MIDAZOLAM HCL 2 MG/2ML IJ SOLN
INTRAMUSCULAR | Status: AC | PRN
  Administered 2018-10-03: 0.5 mg via INTRAVENOUS

## 2018-10-03 MED ORDER — CEFAZOLIN SODIUM-DEXTROSE 2-4 GM/100ML-% IV SOLN
INTRAVENOUS | Status: AC
  Filled 2018-10-03: qty 100

## 2018-10-03 MED ORDER — HEPARIN (PORCINE) 25000 UT/250ML-% IV SOLN
1250.0000 [IU]/h | INTRAVENOUS | Status: DC
  Administered 2018-10-03 – 2018-10-04 (×2): 800 [IU]/h via INTRAVENOUS
  Administered 2018-10-05 – 2018-10-07 (×3): 900 [IU]/h via INTRAVENOUS
  Administered 2018-10-08 – 2018-10-09 (×2): 1000 [IU]/h via INTRAVENOUS
  Administered 2018-10-11 – 2018-10-13 (×3): 1250 [IU]/h via INTRAVENOUS
  Filled 2018-10-03 (×11): qty 250

## 2018-10-03 NOTE — Progress Notes (Signed)
SLP Cancellation Note  Patient Details Name: LADA FULBRIGHT MRN: [id number] DOB: 08/05/1950   Cancelled treatment:       Reason Eval/Treat Not Completed: Other (comment) Pt NPO pending procedure. Will f/u as able on next date.    [name] Charice Zuno 10/03/2018, 11:30 AM  Pollyann Glen, M.A. [place] Acute Environmental education officer [phone] Office [phone]

## 2018-10-03 NOTE — Progress Notes (Signed)
[place] for Heparin Indication: atrial fibrillation  Allergies  Allergen Reactions  . Doxycycline     REACTION: Wheals and pruritus    Patient Measurements: Height: 5' (152.4 cm) Weight: 238 lb 5.1 oz (108.1 kg) IBW/kg (Calculated) : 45.5  Vital Signs: Temp: 97.8 F (36.6 C) (06/08 1200) Temp Source: Oral (06/08 1200) BP: 97/49 (06/08 1555) Pulse Rate: 48 (06/08 1555)  Labs: Recent Labs    10/01/18 0355  10/02/18 0411 10/02/18 1624 10/03/18 0409 10/03/18 1212  HGB 10.2*  --  10.8*  --  10.3*  --   HCT 34.9*  --  37.4  --  35.4*  --   PLT 111*  --  78*  --  73*  --   APTT 91*  --  90*  --  86*  --   LABPROT  --   --   --   --   --  14.5  INR  --   --   --   --   --  1.1  HEPARINUNFRC 0.83*  --  0.70  --  0.55  --   CREATININE 2.18*   < > 2.12* 2.16* 2.10*  --    < > = values in this interval not displayed.    Estimated Creatinine Clearance: 29.3 mL/min (A) (by C-G formula based on SCr of 2.1 mg/dL (H)).  Assessment: Pt is a 67yoF with a history of afib. She was on Eliquis PTA, received in hospital, last dose 5/29. Apixaban on hold for potential need for [facility], currently on CRRT. Pharmacy consulted to dose heparin  Heparin was stopped for HD cath placement. Prior rate was 800 units/hr with heparin level= 0.55   Goal of Therapy:  Heparin level 0.3-0.7 units/ml  Monitor platelets by anticoagulation protocol: Yes  Plan:  -Resume heparin 4 hours post procedure (~ 8pm) at 800 units/hr -heparin level and CBC in am  Hildred Laser, PharmD Clinical Pharmacist **Pharmacist phone directory can now be found on [email or web address] ([email or web address]).  Listed under No Name.

## 2018-10-03 NOTE — Consult Note (Signed)
Chief Complaint: Patient was seen in consultation today for acute renal failure  Referring Physician(s): Dr. Augustin Coupe  Supervising Physician: Daryll Brod  Patient Status: [facility] - In-pt  History of Present Illness: Kathleen [name] is a 68 y.o. female with past medical history of a fib, HTN, DM, OHS, diastolic CHF who suffered PEA arrest on 09/24/18.  She now has acute-on-chronic renal failure with severe volume overload.  She has been maintained on CVVHD via right temp catheter. IR consulted for tunneled dialysis catheter placement.   Patient has been NPO as of 9AM.  On heparin drip.   Past Medical History:  Diagnosis Date  . Atrial flutter ([place])    a. permanent.  . Bradycardia   . Chronic diastolic (congestive) heart failure ([place]) 01/10/2015  . CKD (chronic kidney disease), stage IV ([place])   . Degenerative joint disease of hand   . Diabetes mellitus   . Dyslipidemia   . Fecal occult blood test positive   . GERD (gastroesophageal reflux disease)   . Headache   . Hypertension   . Inadequate material resources   . Irritable bowel syndrome   . Morbid obesity ([place])   . Obesity hypoventilation syndrome ([place])   . Post-menopausal bleeding   . Shortness of breath dyspnea    multifactorial from obesity, deconditioning, obesity hypoventilation syndrome    Past Surgical History:  Procedure Laterality Date  . CHOLECYSTECTOMY    . EYE SURGERY     left lens implant s/p cataracts  . OTHER SURGICAL HISTORY     right shoulder tendon repair 05/2011  . RIGHT HEART CATH N/A 09/23/2018   Procedure: RIGHT HEART CATH;  Surgeon: Jolaine Artist, MD;  Location: [facility];  Service: Cardiovascular;  Laterality: N/A;    Allergies: Doxycycline  Medications: Prior to Admission medications   Medication Sig Start Date End Date Taking? Authorizing Provider  apixaban (ELIQUIS) 5 MG TABS tablet Take 1 tablet (5 mg total) by mouth 2 (two) times daily. 06/07/18  Yes Aldine Contes, MD   atorvastatin (LIPITOR) 40 MG tablet Take 1 tablet (40 mg total) by mouth daily. 02/14/18  Yes Aldine Contes, MD  gabapentin (NEURONTIN) 300 MG capsule TAKE 1 CAPSULE EVERY MORNING, TAKE 1 CAPSULE IN THE AFTERNOON, AND TAKE 2 CAPSULES EVERY NIGHT Patient taking differently: Take 300 mg by mouth. TAKE 300 mg CAPSULE EVERY MORNING, TAKE 300 mg CAPSULE IN THE AFTERNOON, AND TAKE 600 mg  EVERY NIGHT 09/06/18  Yes Narendra, Nischal, MD  insulin detemir (LEVEMIR) 100 UNIT/ML injection Inject 65 units total into the skin once daily. 02/23/18  Yes [place], Carley D, DO  liraglutide (VICTOZA) 18 MG/3ML SOPN Inject 0.3 mLs (1.8 mg total) into the skin daily. 02/09/18  Yes Seawell, Jaimie A, DO  metolazone (ZAROXOLYN) 5 MG tablet Take 5 mg by mouth daily.   Yes [provider]  omeprazole (PRILOSEC) 20 MG capsule Take 20 mg by mouth daily.   Yes [provider]  POTASSIUM CHLORIDE CRYS ER PO Take 20 mEq by mouth daily. For 10 days   Yes [provider]  torsemide (DEMADEX) 20 MG tablet Take 20 mg by mouth 2 (two) times daily.   Yes [provider]  ACCU-CHEK AVIVA PLUS test strip TEST BLOOD SUGAR THREE TIMES DAILY Patient taking differently: 1 each by Other route 3 (three) times daily.  09/20/18   Aldine Contes, MD  Accu-Chek FastClix Lancets MISC TEST BLOOD SUGAR THREE TIMES DAILY 09/06/18   Aldine Contes, MD  Accu-Chek Softclix Lancets lancets TEST BLOOD SUGAR THREE TIMES DAILY Patient taking differently: 1 each 3 (three) times daily.  09/20/18   Aldine Contes, MD  Blood Glucose Monitoring Suppl (ACCU-CHEK AVIVA PLUS) w/Device KIT TEST BLOOD SUGAR THREE TIMES DAILY 09/05/18   Aldine Contes, MD  Insulin Pen Needle (B-D UF III MINI PEN NEEDLES) 31G X 5 MM MISC USE ONE TIME DAILY TO INJECT VICTOZA. DX:E11.9 09/06/18   Aldine Contes, MD  Insulin Syringe-Needle U-100 (BD VEO INSULIN SYRINGE U/F) 31G X 15/64" 1 ML MISC USE TO INJECT INSULIN ONE TIME A DAY  06/03/18   Aldine Contes, MD  metFORMIN (GLUCOPHAGE) 500 MG tablet Take 1 tablet (500 mg total) by mouth 2 (two) times daily with a meal. 09/13/18   Aldine Contes, MD  Multiple Vitamins-Minerals (HEALTHY EYES PO) Take 1 tablet by mouth daily. Reported on 05/17/2015    [provider]  NON FORMULARY Place 2 L into the nose daily. Wears with exertion and qhs    [provider]     Family History  Problem Relation Age of Onset  . Diabetes Mother   . Obesity Mother   . Stroke Father   . Diabetes Sister   . Colon cancer Neg Hx     Social History   Socioeconomic History  . Marital status: Divorced    Spouse name: Not on file  . Number of children: 2  . Years of education: Not on file  . Highest education level: Not on file  Occupational History  . Occupation: unemployed    Fish farm manager: UNEMPLOYED  Social Needs  . Financial resource strain: Not on file  . Food insecurity:    Worry: Not on file    Inability: Not on file  . Transportation needs:    Medical: Not on file    Non-medical: Not on file  Tobacco Use  . Smoking status: Never Smoker  . Smokeless tobacco: Never Used  Substance and Sexual Activity  . Alcohol use: No    Alcohol/week: 0.0 standard drinks  . Drug use: No  . Sexual activity: Not Currently  Lifestyle  . Physical activity:    Days per week: Not on file    Minutes per session: Not on file  . Stress: Not on file  Relationships  . Social connections:    Talks on phone: Not on file    Gets together: Not on file    Attends religious service: Not on file    Active member of club or organization: Not on file    Attends meetings of clubs or organizations: Not on file    Relationship status: Not on file  Other Topics Concern  . Not on file  Social History Narrative   Single.   Divorced x2.   Has 2 children, by two different fathers.   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 Currently  unemployed. May apply for disability/SSI given her pain in her hands which has made interviewing for jobs difficult.   Never Smoked   Alcohol use-no   Drug use-no   Regular exercise-yes      Financial assistance approved for 100% discount at [facility] and has [facility] card per [employer]   02/18/2010              Review of Systems: A 12 point ROS discussed and pertinent positives are indicated in the HPI above.  All other systems are negative.  Review of Systems  Constitutional: Negative for fatigue and fever.  Respiratory: Negative for cough and shortness of breath.   Cardiovascular: Negative for chest pain.  Gastrointestinal: Negative for abdominal pain.  Genitourinary:       No urine output  Musculoskeletal: Negative for back pain.  Psychiatric/Behavioral: Negative for behavioral problems and confusion.    Vital Signs: BP (!) 99/47   Pulse (!) 51   Temp 98.3 F (36.8 C) (Oral)   Resp 19   Ht 5' (1.524 m)   Wt 238 lb 5.1 oz (108.1 kg)   SpO2 93%   BMI 46.54 kg/m   Physical Exam Vitals signs and nursing note reviewed.  Constitutional:      Appearance: Normal appearance.  HENT:     Mouth/Throat:     Mouth: Mucous membranes are moist.     Pharynx: Oropharynx is clear.  Neck:     Musculoskeletal: Normal range of motion.  Cardiovascular:     Rate and Rhythm: Normal rate and regular rhythm.  Pulmonary:     Effort: Pulmonary effort is normal. No respiratory distress.     Breath sounds: Normal breath sounds.  Abdominal:     General: Abdomen is flat.     Palpations: Abdomen is soft.  Neurological:     General: No focal deficit present.     Mental Status: She is alert and oriented to person, place, and time. Mental status is at baseline.  Psychiatric:        Mood and Affect: Mood normal.        Behavior: Behavior normal.        Thought Content: Thought content normal.        Judgment: Judgment normal.      MD Evaluation Airway: WNL Heart: Other (comments)  Heart  comments: diastolic HF/RV failure Abdomen: WNL Chest/ Lungs: WNL ASA  Classification: 3 Mallampati/Airway Score: Three   Imaging: Dg Chest 2 View  Result Date: 09/14/2018 CLINICAL DATA:  Wheezing, shortness of breath EXAM: CHEST - 2 VIEW COMPARISON:  01/14/2015 FINDINGS: Bilateral interstitial thickening. No pleural effusion or pneumothorax. Stable cardiomegaly. No acute osseous abnormality. IMPRESSION: Mild CHF. Electronically Signed   By: Kathreen Devoid   On: 09/14/2018 08:40   US Renal  Result Date: 09/22/2018 CLINICAL DATA:  Renal insufficiency. EXAM: RENAL / URINARY TRACT ULTRASOUND COMPLETE COMPARISON:  None. FINDINGS: Right Kidney: Renal measurements: 11.6 x 4.7 x 4.8 cm = volume: 136.1 mL. Increased renal echogenicity but relatively normal renal cortical thickness and no focal lesions or hydronephrosis. Left Kidney: Renal measurements: 11.2 x 5.6 x 5.1 cm = volume: 166.8 mL. Increased renal echogenicity but relatively normal renal cortical thickness and no focal lesions or hydronephrosis. Bladder: Nondistended bladder.  Patient voided before the exam. IMPRESSION: Increased renal echogenicity suggesting medical renal disease but no focal renal lesions, hydronephrosis or renal calculi. Nondistended bladder. Electronically Signed   By: Marijo Sanes M.D.   On: 09/22/2018 19:11   Dg Chest Port 1 View  Result Date: 10/02/2018 CLINICAL DATA:  Respiratory failure EXAM: PORTABLE CHEST 1 VIEW COMPARISON:  Chest radiograph 09/29/2018 FINDINGS: Right IJ central venous catheter tip projects over the superior vena cava. Enteric tube courses inferior to the diaphragm. Left upper extremity PICC line tip projects over the superior vena cava. Monitoring leads overlie the patient. Stable cardiomegaly. Similar-appearing diffuse bilateral heterogeneous pulmonary opacities. No definite pleural effusion or pneumothorax. IMPRESSION: Cardiomegaly and pulmonary edema. Electronically Signed   By: Polly Cobia.D.  On: 10/02/2018 08:13   Dg Chest Port 1 View  Result Date: 09/29/2018 CLINICAL DATA:  Respiratory failure EXAM: PORTABLE CHEST 1 VIEW COMPARISON:  09/28/2018 FINDINGS: Endotracheal tube and gastric catheter have been removed in the interval. Right jugular central line and left-sided PICC line are again seen and stable. Heart is enlarged in size but stable. Increasing vascular congestion is noted with interstitial edema. Some patchy density is noted which may represent some overlying atelectasis. This is noted in the mid and lower lung fields on the right. IMPRESSION: Increasing vascular congestion/edema and right-sided atelectatic changes. Electronically Signed   By: Inez Catalina M.D.   On: 09/29/2018 07:13   Dg Chest Port 1 View  Result Date: 09/28/2018 CLINICAL DATA:  Respiratory failure EXAM: PORTABLE CHEST 1 VIEW COMPARISON:  09/27/2018 FINDINGS: Endotracheal tube, gastric catheter and right jugular central line are again seen. Left-sided PICC line is noted at the junction of the innominate veins. Persistent but improved vascular congestion is noted. No new focal infiltrate is seen. IMPRESSION: Improvement in the degree of pulmonary edema. No new focal abnormality is seen. Electronically Signed   By: Inez Catalina M.D.   On: 09/28/2018 08:14   Dg Chest Port 1 View  Result Date: 09/27/2018 CLINICAL DATA:  Check endotracheal tube placement EXAM: PORTABLE CHEST 1 VIEW COMPARISON:  09/26/2018 FINDINGS: Cardiac shadow remains enlarged. Endotracheal tube, gastric catheter and right jugular central line are noted in satisfactory position. Left-sided PICC line is noted as well. Diffuse vascular congestion is again seen with some increase in the degree of pulmonary edema when compare with the prior exam. No bony abnormality is noted. IMPRESSION: Increase in the degree of pulmonary edema. Electronically Signed   By: Inez Catalina M.D.   On: 09/27/2018 07:45   Dg Chest Port 1 View  Result Date: 09/26/2018  CLINICAL DATA:  Check endotracheal tube placement EXAM: PORTABLE CHEST 1 VIEW COMPARISON:  09/24/2018 FINDINGS: Endotracheal tube, gastric catheter and right jugular central lines are again seen and stable. Left-sided PICC line is noted at the confluence of the innominate veins. Cardiac shadow is mildly enlarged but accentuated by the frontal technique. Vascular congestion and edema is again identified stable in appearance from the prior exam. No new focal infiltrate is seen. IMPRESSION: Tubes and lines as described above. Stable vascular congestion and pulmonary edema. Electronically Signed   By: Inez Catalina M.D.   On: 09/26/2018 07:15   Dg Chest Port 1 View  Result Date: 09/24/2018 CLINICAL DATA:  Central line placement.  History of CHF. EXAM: PORTABLE CHEST 1 VIEW COMPARISON:  Sep 24, 2018 FINDINGS: A left PICC line terminates near the confluence of the brachiocephalic veins. The ETT is in good position. A new right central line terminates in the SVC. No pneumothorax. Increasing bilateral pulmonary opacities, more focal in the right. NG tube terminates below today's film. No other acute abnormalities. IMPRESSION: 1. The new right PICC line terminates in the SVC without pneumothorax. Other support apparatus as above. 2. Increasing bilateral pulmonary opacities, right greater than left. While I suspect a component of pulmonary edema, underlying infection on the right should be considered. Electronically Signed   By: Dorise Bullion III M.D   On: 09/24/2018 13:59   Dg Chest Port 1 View  Result Date: 09/24/2018 CLINICAL DATA:  Intubation EXAM: PORTABLE CHEST 1 VIEW COMPARISON:  09/23/2018 FINDINGS: Endotracheal tube terminates 3.5 cm above the carina. Cardiomegaly with increased interstitial markings, possibly reflecting interstitial edema, although more focal opacity in the  right mid lung raises concern for possible infection/pneumonia. Possible small left pleural effusion. No pneumothorax. Defibrillator  pads overlying the left hemithorax. Left subclavian venous catheter versus left arm PICC terminates in the distal left brachiocephalic vein. Enteric tube courses into the stomach. IMPRESSION: Endotracheal tube terminates 3.5 cm above the carina. Left subclavian venous catheter versus left arm PICC terminates the distal left brachiocephalic vein. Focal right upper lobe opacity raises concern for pneumonia, less likely asymmetric interstitial edema. Possible small left pleural effusion. Electronically Signed   By: Julian Hy M.D.   On: 09/24/2018 09:34   Dg Chest Port 1 View  Result Date: 09/23/2018 CLINICAL DATA:  Central line placement. EXAM: PORTABLE CHEST 1 VIEW COMPARISON:  09/13/2018. FINDINGS: Apical lordotic view obtained. Prominence of superior mediastinum most likely related to prominent great vessels and apical lordotic projection. Left sided PICC line noted with tip over superior vena cava. Cardiomegaly with diffuse bilateral pulmonary interstitial prominence. Findings suggest mild CHF. Pneumonitis cannot be excluded. Tiny bilateral pleural effusions cannot be excluded. No pneumothorax. IMPRESSION: 1.  Left PICC line noted over superior vena cava. 2. Cardiomegaly with mild bilateral interstitial prominence suggesting mild CHF. Pneumonitis cannot be excluded. Tiny bilateral pleural effusions cannot be excluded. Electronically Signed   By: Marcello Moores  Register   On: 09/23/2018 12:25   Dg Abd Portable 1v  Result Date: 09/30/2018 CLINICAL DATA:  NG tube placement. EXAM: PORTABLE ABDOMEN - 1 VIEW COMPARISON:  None. FINDINGS: The NG tube tip is in the antropyloric region of the stomach. The bowel gas pattern is unremarkable. IMPRESSION: NG tube tip in the antropyloric region of the stomach. Electronically Signed   By: Marijo Sanes M.D.   On: 09/30/2018 13:14   [place] Ekg Site Rite  Result Date: 09/23/2018 If Site Rite image not attached, placement could not be confirmed due to current cardiac rhythm.    Labs:  CBC: Recent Labs    09/30/18 0410 10/01/18 0355 10/02/18 0411 10/03/18 0409  WBC 4.9 5.5 5.0 4.6  HGB 9.5* 10.2* 10.8* 10.3*  HCT 31.5* 34.9* 37.4 35.4*  PLT 113* 111* 78* 73*    COAGS: Recent Labs    09/30/18 0410 10/01/18 0355 10/02/18 0411 10/03/18 0409  APTT 86* 91* 90* 86*    BMP: Recent Labs    10/01/18 1623 10/02/18 0411 10/02/18 1624 10/03/18 0409  NA 138 139 137 135  K 4.2 4.4 4.7 4.7  CL 103 102 100 102  CO2 26 27 26 27   GLUCOSE 198* 215* 194* 206*  BUN 36* 40* 40* 39*  CALCIUM 8.1* 8.3* 8.2* 8.0*  CREATININE 2.21* 2.12* 2.16* 2.10*  GFRNONAA 22* 24* 23* 24*  GFRAA 26* 27* 27* 28*    LIVER FUNCTION TESTS: Recent Labs    09/22/18 1420  10/01/18 1623 10/02/18 0411 10/02/18 1624 10/03/18 0409  BILITOT 1.2  --   --   --   --   --   AST 18  --   --   --   --   --   ALT 13  --   --   --   --   --   ALKPHOS 127*  --   --   --   --   --   PROT 7.2  --   --   --   --   --   ALBUMIN 3.2*   < > 2.3* 2.2* 2.2* 2.2*   < > = values in this interval not displayed.    TUMOR  MARKERS: No results for input(s): AFPTM, CEA, CA199, CHROMGRNA in the last 8760 hours.  Assessment and Plan: Renal failure Patient with acute-on-chronic renal failure.  Currently on CVVHD via right temp cath.  Request for tunneled dialysis catheter by Dr. Augustin Coupe due to lack of improvement in renal function, no urine output.  NPO as of 9AM.  Hep drip off 2 hrs prior to procedure.  INR ordered.   Patiently reportedly with intermittent confusion, however during discussion today is alert and oriented.  Able to demonstrate understanding of plan and risks.  Is able to teach back our plans for today.  Able to consent for herself at this time.   Risks and benefits discussed with the patient including, but not limited to bleeding, infection, vascular injury, pneumothorax which may require chest tube placement, air embolism or even death  All of the patient's questions were  answered, patient is agreeable to proceed. Consent signed and in chart.  Thank you for this interesting consult.  I greatly enjoyed meeting Kathleen [name] and look forward to participating in their care.  A copy of this report was sent to the requesting provider on this date.  Electronically Signed: Docia Barrier, PA 10/03/2018, 11:17 AM   I spent a total of 40 Minutes    in face to face in clinical consultation, greater than 50% of which was counseling/coordinating care for renal failure.

## 2018-10-03 NOTE — Progress Notes (Signed)
[place] for Heparin Indication: atrial fibrillation  Allergies  Allergen Reactions  . Doxycycline     REACTION: Wheals and pruritus    Patient Measurements: Weight: 238 lb 5.1 oz (108.1 kg)  Vital Signs: Temp: 97.5 F (36.4 C) (06/08 0400) Temp Source: Oral (06/08 0400) BP: 103/38 (06/08 0700) Pulse Rate: 49 (06/08 0700)  Labs: Recent Labs    10/01/18 0355  10/02/18 0411 10/02/18 1624 10/03/18 0409  HGB 10.2*  --  10.8*  --  10.3*  HCT 34.9*  --  37.4  --  35.4*  PLT 111*  --  78*  --  73*  APTT 91*  --  90*  --  86*  HEPARINUNFRC 0.83*  --  0.70  --  0.55  CREATININE 2.18*   < > 2.12* 2.16* 2.10*   < > = values in this interval not displayed.    Estimated Creatinine Clearance: 29.3 mL/min (A) (by C-G formula based on SCr of 2.1 mg/dL (H)).  Assessment: Pt is a 67yoF with a history of afib. She was on Eliquis PTA, received in hospital, last dose 5/29. Apixaban on hold for potential need for [facility], currently on CRRT. Pharmacy consulted to start heparin. Eliquis interferes with HL monitoring, will monitor aPTT until correlating.  APTT therapeutic at 86, on heparin 900 units/hr running peripherally. Heparin level correlating at 0.55. Hgb stable at 10.3, plt 73(down). No signs/symptoms of bleeding or issues with infusion reported by nursing.   Goal of Therapy:  Heparin level 0.3-0.7 units/ml  APTT 66-102 Monitor platelets by anticoagulation protocol: Yes  Plan:  Continue heparin at 800 units/hr Heparin level daily. Will discontinue aPTT monitoring now that HL correlating. Monitor s/sx of bleeding  Claiborne Billings, PharmD PGY2 Cardiology Pharmacy Resident Please check AMION for all Pharmacist numbers by unit 10/03/2018 7:28 AM

## 2018-10-03 NOTE — Progress Notes (Addendum)
[facility] Progress Note    Assessment/ Plan:   Pt is a 68 y.o. yo female HTN, Afib, DM, morbid obesity, diastolic heart failure and also baseline CKD- crt mid to high 1's who was admitted on 09/22/2018 with volume overload, then suffered cardiac arrest   1. Renal- A on CRF after cardiac arrest on 5/30.  Baseline crt in the mid to high 1's.   Required initiation of CRRT early on 5/30- overall negative fluid balance on CRRT- almost 20 liters.  now  running even.  No uop so will cont CRRT for now while still on pressors.  Since had incont episode tried to collect with purewick, not successful  - would be hopeful for eventual recovery but no signs yet     Seen on CRRT RIJ temp 500/300/1000 4K, net even On heparin gtt  - Will req conversion to TC by VIR (on heparin gtt) and then trial off CRRT w/ iHD.  2. HTN/volume-  Tolerated UF so far- CVP much better - still with relatively high o2 -  running even  3. Anemia- hgb close to 10 - no meds yet 4. [place]-  K  OK for now - phos OK   Subjective:   Off pressors CVP 8-9 No f/c overnight. +cough   Objective:   BP (!) 99/52 (BP Location: Right Wrist)   Pulse (!) 53   Temp (!) 97.5 F (36.4 C) (Oral)   Resp 17   Ht 5' (1.524 m)   Wt 108.1 kg   SpO2 94%   BMI 46.54 kg/m   Intake/Output Summary (Last 24 hours) at 10/03/2018 1308 Last data filed at 10/03/2018 0800 Gross per 24 hour  Intake 1816.71 ml  Output 1861 ml  Net -44.29 ml   Weight change: 0.6 kg  Physical Exam: General: obese, alert but confused Heart: brady  Lungs: CBS bilat Abdomen: obese, non tender Extremities: edema better but still present  Dialysis Access: right IJ vascath placed 5/30   Imaging: Dg Chest Port 1 View  Result Date: 10/02/2018 CLINICAL DATA:  Respiratory failure EXAM: PORTABLE CHEST 1 VIEW COMPARISON:  Chest radiograph 09/29/2018 FINDINGS: Right IJ central venous catheter tip projects over the superior vena cava. Enteric tube courses  inferior to the diaphragm. Left upper extremity PICC line tip projects over the superior vena cava. Monitoring leads overlie the patient. Stable cardiomegaly. Similar-appearing diffuse bilateral heterogeneous pulmonary opacities. No definite pleural effusion or pneumothorax. IMPRESSION: Cardiomegaly and pulmonary edema. Electronically Signed   By: Lovey Newcomer M.D.   On: 10/02/2018 08:13    Labs: BMET Recent Labs  Lab 09/30/18 0409 09/30/18 1600 10/01/18 0355 10/01/18 1623 10/02/18 0411 10/02/18 1624 10/03/18 0409  NA 137 138 139 138 139 137 135  K 4.5 4.3 4.1 4.2 4.4 4.7 4.7  CL 103 102 104 103 102 100 102  CO2 25 24 26 26 27 26 27   GLUCOSE 89 135* 190* 198* 215* 194* 206*  BUN 26* 27* 34* 36* 40* 40* 39*  CREATININE 2.02* 2.06* 2.18* 2.21* 2.12* 2.16* 2.10*  CALCIUM 8.5* 8.3* 8.3* 8.1* 8.3* 8.2* 8.0*  PHOS 2.0* 4.0 2.5 2.5 2.9 2.4* 1.8*   CBC Recent Labs  Lab 09/30/18 0410 10/01/18 0355 10/02/18 0411 10/03/18 0409  WBC 4.9 5.5 5.0 4.6  NEUTROABS  --   --  2.6  --   HGB 9.5* 10.2* 10.8* 10.3*  HCT 31.5* 34.9* 37.4 35.4*  MCV 88.7 85.7 84.8 85.5  PLT 113* 111* 78* 73*  Medications:    . atorvastatin  40 mg Oral q1800  . B-complex with vitamin C  1 tablet Oral Daily  . chlorhexidine  15 mL Mouth Rinse BID  . Chlorhexidine Gluconate Cloth  6 each Topical Daily  . feeding supplement (PRO-STAT SUGAR FREE 64)  30 mL Per Tube TID  . feeding supplement (VITAL 1.5 CAL)  1,000 mL Per Tube Q24H  . gabapentin  100 mg Per Tube Q12H  . insulin aspart  0-20 Units Subcutaneous Q4H  . mouth rinse  15 mL Mouth Rinse q12n4p  . midodrine  10 mg Oral TID WC      Otelia Santee, MD 10/03/2018, 8:32 AM

## 2018-10-03 NOTE — Progress Notes (Signed)
Pt has not been wearing heated HFNC, removed from room at this time. RT will continue to monitor.

## 2018-10-03 NOTE — Progress Notes (Signed)
Patient ID: Kathleen [name], female   DOB: Dec 29, 1950, 68 y.o.   MRN: [id number]    Advanced Heart Failure Rounding Note   Subjective:     Remains on CVVHD. Keeping even. Remains confused. Denies SOB,orthopnea or PND. CVP 8-9. Off pressors  Objective:   Weight Range:  Vital Signs:   Temp:  [97.5 F (36.4 C)-98.5 F (36.9 C)] 98.3 F (36.8 C) (06/08 0840) Pulse Rate:  [49-56] 53 (06/08 0800) Resp:  [13-22] 17 (06/08 0800) BP: (58-135)/(30-94) 99/52 (06/08 0800) SpO2:  [90 %-97 %] 94 % (06/08 0800) Weight:  [108.1 kg] 108.1 kg (06/08 0400) Last BM Date: 10/03/18  Weight change: Filed Weights   10/01/18 0400 10/02/18 0400 10/03/18 0400  Weight: 105.8 kg 107.5 kg 108.1 kg    Intake/Output:   Intake/Output Summary (Last 24 hours) at 10/03/2018 0859 Last data filed at 10/03/2018 0800 Gross per 24 hour  Intake 1816.71 ml  Output 1861 ml  Net -44.29 ml     Physical Exam: General:  Lying in bed. No resp difficulty HEENT: normal Neck: supple. RIJ trialysis Carotids 2+ bilat; no bruits. No lymphadenopathy or thryomegaly appreciated. Cor: PMI nondisplaced. Regular brady No rubs, gallops or murmurs. Lungs: clear Abdomen: soft, nontender, nondistended. No hepatosplenomegaly. No bruits or masses. Good bowel sounds. Extremities: no cyanosis, clubbing, rash,+UNNA boots  Trace edema Neuro: alert. occasionally confused. Moves all 4   Telemetry: Junctional rhythm 50s. Personally reviewed   Labs: Basic Metabolic Panel: Recent Labs  Lab 09/26/18 1552 09/27/18 0430  09/27/18 1516  10/01/18 0355 10/01/18 1623 10/02/18 0411 10/02/18 1624 10/03/18 0409  NA 136 136   < >  --    < > 139 138 139 137 135  K 4.7 4.5   < >  --    < > 4.1 4.2 4.4 4.7 4.7  CL 102 102  --   --    < > 104 103 102 100 102  CO2 26 25  --   --    < > 26 26 27 26 27   GLUCOSE 169* 158*  --   --    < > 190* 198* 215* 194* 206*  BUN 31* 29*  --   --    < > 34* 36* 40* 40* 39*  CREATININE 1.83* 1.87*  --   --     < > 2.18* 2.21* 2.12* 2.16* 2.10*  CALCIUM 8.4* 8.5*  --   --    < > 8.3* 8.1* 8.3* 8.2* 8.0*  MG 2.5* 2.4  --  2.4  --   --   --  2.6*  --   --   PHOS 2.7 2.1*  --   --    < > 2.5 2.5 2.9 2.4* 1.8*   < > = values in this interval not displayed.    Liver Function Tests: Recent Labs  Lab 10/01/18 0355 10/01/18 1623 10/02/18 0411 10/02/18 1624 10/03/18 0409  ALBUMIN 2.5* 2.3* 2.2* 2.2* 2.2*   No results for input(s): LIPASE, AMYLASE in the last 168 hours. No results for input(s): AMMONIA in the last 168 hours.  CBC: Recent Labs  Lab 09/29/18 0435 09/30/18 0410 10/01/18 0355 10/02/18 0411 10/03/18 0409  WBC 6.0 4.9 5.5 5.0 4.6  NEUTROABS  --   --   --  2.6  --   HGB 9.6* 9.5* 10.2* 10.8* 10.3*  HCT 30.4* 31.5* 34.9* 37.4 35.4*  MCV 92.1 88.7 85.7 84.8 85.5  PLT 131* 113* 111* 78* 73*  Cardiac Enzymes: No results for input(s): CKTOTAL, CKMB, CKMBINDEX, TROPONINI in the last 168 hours.  BNP: BNP (last 3 results) Recent Labs    09/22/18 1420  BNP 451.2*    ProBNP (last 3 results) No results for input(s): PROBNP in the last 8760 hours.    Other results:  Imaging: Dg Chest Port 1 View  Result Date: 10/02/2018 CLINICAL DATA:  Respiratory failure EXAM: PORTABLE CHEST 1 VIEW COMPARISON:  Chest radiograph 09/29/2018 FINDINGS: Right IJ central venous catheter tip projects over the superior vena cava. Enteric tube courses inferior to the diaphragm. Left upper extremity PICC line tip projects over the superior vena cava. Monitoring leads overlie the patient. Stable cardiomegaly. Similar-appearing diffuse bilateral heterogeneous pulmonary opacities. No definite pleural effusion or pneumothorax. IMPRESSION: Cardiomegaly and pulmonary edema. Electronically Signed   By: Lovey Newcomer M.D.   On: 10/02/2018 08:13     Medications:     Scheduled Medications: . atorvastatin  40 mg Oral q1800  . B-complex with vitamin C  1 tablet Oral Daily  . chlorhexidine  15 mL Mouth  Rinse BID  . Chlorhexidine Gluconate Cloth  6 each Topical Daily  . feeding supplement (PRO-STAT SUGAR FREE 64)  30 mL Per Tube TID  . feeding supplement (VITAL 1.5 CAL)  1,000 mL Per Tube Q24H  . gabapentin  100 mg Per Tube Q12H  . insulin aspart  0-20 Units Subcutaneous Q4H  . mouth rinse  15 mL Mouth Rinse q12n4p  . midodrine  10 mg Oral TID WC    Infusions: .  prismasol BGK 4/2.5 500 mL/hr at 10/03/18 0844  .  prismasol BGK 4/2.5 300 mL/hr at 10/03/18 0729  . sodium chloride    .  ceFAZolin (ANCEF) IV Stopped (10/02/18 2221)  . famotidine (PEPCID) IV Stopped (10/02/18 1437)  . heparin 800 Units/hr (10/03/18 0800)  . norepinephrine (LEVOPHED) Adult infusion Stopped (10/02/18 0816)  . prismasol BGK 4/2.5 1,000 mL/hr at 10/03/18 0747    PRN Medications: Place/Maintain arterial line **AND** sodium chloride, fentaNYL (SUBLIMAZE) injection, ondansetron, risperiDONE, sodium chloride flush   Assessment/Plan:   1. PEA arrest on 5/30 - due to hypoxia/pulmonary edema.  2. Acute on chronic hypoxic respiratory failure - due to OHS/OSA  - intubated on 5/30 in setting of arrest.  - extubated 6/3.  - now with MSSA PNA  On cefazolin day 4/7 - Respiratory status improved - sleep study 4/15 without OSA (AHI 1.4) but had nocturnal desat - likely need to repeat at some point   3.  Acute on chronic diastolic HF - Admitted with markedly elevated filling pressures complicated by severe cardiorenal syndrome - Now down 65 pounds - Now improving with CVVHD with CVP down to 8-9 - Volume status at baseline, CVVH running even.   - Suspect she may progress to long-term HD, no UOP currently - Off NE and midodrine 10 tid - D/w Renal will plan tunneled cath tomorrow then switch to iHD     4. Pulmonary HTN, severe  - this is mostly pulmonary venous HTN by cath [facility] Group 2) but also has a component of OHS/OSA (WHO group 3) - no role for selective pulmonary vasodilators - will improve significantly  with volume removal - consider repeat RHC after volume removal   5. AKI on CKD 3 - baseline creatinine 1.9 -> ESRD - likely cardiorenal - We have suspected that she may progress to long-term HD. No UOP.  - Plan as above., If can switch to [facility] tomorrow can  go to the floor on TRH service,  6. Morbid obesity - desperately needs weight loss  6. Chronic AF/AFL - Off apixaban. Now on heparin with CVVHD. No bleeding.Discussed dosing with PharmD personally. - Rhythm looks junctional currently (stable)  7. VT/NSVT - off amio due to bradycardia - Keep K> 4.0 Mg 2.0  8. Fever with MSSA PNA - Bcx drawn 6/2. Negative - sputum cx with MSSA PNA - on cefazolin   Will continue to follow while in ICU but when transfers out of unit should go to [facility].   Length of Stay: 11  Glori Bickers MD 10/03/2018, 8:59 AM  Advanced Heart Failure Team Pager [phone] (M-F; [place])  Please contact [facility] for night-coverage after hours (4p -7a ) and weekends on [email or web address]

## 2018-10-03 NOTE — Plan of Care (Signed)
  Problem: Clinical Measurements: Goal: Ability to maintain clinical measurements within normal limits will improve Outcome: Progressing Goal: Diagnostic test results will improve Outcome: Progressing Goal: Respiratory complications will improve Outcome: Progressing Goal: Cardiovascular complication will be avoided Outcome: Progressing   Problem: Pain Managment: Goal: General experience of comfort will improve Outcome: Progressing

## 2018-10-03 NOTE — Sedation Documentation (Signed)
ETCO2 unable to properly coorelate due to patient mouth breathing

## 2018-10-03 NOTE — Procedures (Signed)
Renal failure  S/p RT IJ HD CATH  Tip svcra No comp Stable ebl min Ready for use Full report in pacs

## 2018-10-04 LAB — RENAL FUNCTION PANEL
Albumin: 2.1 g/dL — ABNORMAL LOW (ref 3.5–5.0)
Albumin: 2.1 g/dL — ABNORMAL LOW (ref 3.5–5.0)
Anion gap: 11 (ref 5–15)
Anion gap: 8 (ref 5–15)
BUN: 40 mg/dL — ABNORMAL HIGH (ref 8–23)
BUN: 33 mg/dL — ABNORMAL HIGH (ref 8–23)
CO2: 24 mmol/L (ref 22–32)
CO2: 24 mmol/L (ref 22–32)
Calcium: 8.1 mg/dL — ABNORMAL LOW (ref 8.9–10.3)
Calcium: 7.7 mg/dL — ABNORMAL LOW (ref 8.9–10.3)
Chloride: 99 mmol/L (ref 98–111)
Chloride: 103 mmol/L (ref 98–111)
Creatinine, Ser: 2.05 mg/dL — ABNORMAL HIGH (ref 0.44–1.00)
Creatinine, Ser: 1.84 mg/dL — ABNORMAL HIGH (ref 0.44–1.00)
GFR calc Af Amer: 29 mL/min — ABNORMAL LOW (ref >60)
GFR calc Af Amer: 33 mL/min — ABNORMAL LOW (ref >60)
GFR calc non Af Amer: 25 mL/min — ABNORMAL LOW (ref >60)
GFR calc non Af Amer: 28 mL/min — ABNORMAL LOW (ref >60)
Glucose, Bld: 279 mg/dL — ABNORMAL HIGH (ref 70–99)
Glucose, Bld: 261 mg/dL — ABNORMAL HIGH (ref 70–99)
Phosphorus: 2 mg/dL — ABNORMAL LOW (ref 2.5–4.6)
Phosphorus: 1.6 mg/dL — ABNORMAL LOW (ref 2.5–4.6)
Potassium: 4.5 mmol/L (ref 3.5–5.1)
Potassium: 4.4 mmol/L (ref 3.5–5.1)
Sodium: 134 mmol/L — ABNORMAL LOW (ref 135–145)
Sodium: 135 mmol/L (ref 135–145)

## 2018-10-04 LAB — CBC
HCT: 34.3 % — ABNORMAL LOW (ref 36.0–46.0)
Hemoglobin: 10 g/dL — ABNORMAL LOW (ref 12.0–15.0)
MCH: 25.2 pg — ABNORMAL LOW (ref 26.0–34.0)
MCHC: 29.2 g/dL — ABNORMAL LOW (ref 30.0–36.0)
MCV: 86.4 fL (ref 80.0–100.0)
Platelets: 85 10*3/uL — ABNORMAL LOW (ref 150–400)
RBC: 3.97 MIL/uL (ref 3.87–5.11)
RDW: 19.4 % — ABNORMAL HIGH (ref 11.5–15.5)
WBC: 5.4 10*3/uL (ref 4.0–10.5)
nRBC: 0.9 % — ABNORMAL HIGH (ref 0.0–0.2)

## 2018-10-04 LAB — GLUCOSE, CAPILLARY
Glucose-Capillary: 161 mg/dL — ABNORMAL HIGH (ref 70–99)
Glucose-Capillary: 221 mg/dL — ABNORMAL HIGH (ref 70–99)
Glucose-Capillary: 233 mg/dL — ABNORMAL HIGH (ref 70–99)
Glucose-Capillary: 235 mg/dL — ABNORMAL HIGH (ref 70–99)
Glucose-Capillary: 250 mg/dL — ABNORMAL HIGH (ref 70–99)
Glucose-Capillary: 268 mg/dL — ABNORMAL HIGH (ref 70–99)
Glucose-Capillary: 296 mg/dL — ABNORMAL HIGH (ref 70–99)

## 2018-10-04 LAB — HEPARIN LEVEL (UNFRACTIONATED): Heparin Unfractionated: 0.44 IU/mL (ref 0.30–0.70)

## 2018-10-04 MED ORDER — SODIUM CHLORIDE 0.9 % IV SOLN
INTRAVENOUS | Status: DC | PRN
  Administered 2018-10-04: 500 mL via INTRAVENOUS
  Administered 2018-10-15: 1000 mL via INTRAVENOUS
  Administered 2018-10-28 – 2018-10-31 (×2): 250 mL via INTRAVENOUS

## 2018-10-04 MED ORDER — RISPERIDONE 0.5 MG PO TABS
0.5000 mg | ORAL_TABLET | Freq: Two times a day (BID) | ORAL | Status: DC | PRN
  Administered 2018-10-04: 0.5 mg via ORAL
  Filled 2018-10-04 (×2): qty 1

## 2018-10-04 NOTE — Progress Notes (Addendum)
Patient ID: Kathleen [name], female   DOB: May 02, 1950, 68 y.o.   MRN: [id number]    Advanced Heart Failure Rounding Note   Subjective:     Had tunneled cath placed yesterday. Trialysis out.  Remains on CVVHD. Pulling -100. Back on NE at 3-4 for BP support. CVP 8-9.   More alert but very weak. Denies SOB, orthopnea or PND. No bleeding on heparin.    Objective:   Weight Range:  Vital Signs:   Temp:  [97.4 F (36.3 C)-98.8 F (37.1 C)] 97.6 F (36.4 C) (06/09 2000) Pulse Rate:  [43-51] 45 (06/09 2030) Resp:  [12-23] 13 (06/09 2030) BP: (62-129)/(30-98) 97/80 (06/09 2030) SpO2:  [90 %-97 %] 92 % (06/09 2030) Weight:  [108.4 kg] 108.4 kg (06/09 0400) Last BM Date: 10/03/18  Weight change: Filed Weights   10/02/18 0400 10/03/18 0400 10/04/18 0400  Weight: 107.5 kg 108.1 kg 108.4 kg    Intake/Output:   Intake/Output Summary (Last 24 hours) at 10/04/2018 2108 Last data filed at 10/04/2018 2100 Gross per 24 hour  Intake 1645.3 ml  Output 1408 ml  Net 237.3 ml     Physical Exam: General:  Lying in bed. No resp difficulty HEENT: normal Neck: supple. JVP 10  Carotids 2+ bilat; no bruits. No lymphadenopathy or thryomegaly appreciated. Cor: PMI nondisplaced. Mildly irregular. Loletha Grayer. No rubs, gallops or murmurs. R chest tunnled cath site ok  Lungs: clear Abdomen: obese soft, nontender, nondistended. No hepatosplenomegaly. No bruits or masses. Good bowel sounds. Extremities: no cyanosis, clubbing, rash, 1+ edema (tender to palpation) + UNNA boots Neuro: alert follows commandscranial nerves grossly intact. moves all 4 extremities w/o difficulty. Affect pleasant    Telemetry: Likely slow AFL 50s. Personally reviewed   Labs: Basic Metabolic Panel: Recent Labs  Lab 10/02/18 0411 10/02/18 1624 10/03/18 0409 10/03/18 1704 10/04/18 0410 10/04/18 1634  NA 139 137 135 137 134* 135  K 4.4 4.7 4.7 4.5 4.5 4.4  CL 102 100 102 103 99 103  CO2 27 26 27 24 24 24   GLUCOSE 215* 194*  206* 135* 279* 261*  BUN 40* 40* 39* 38* 40* 33*  CREATININE 2.12* 2.16* 2.10* 2.22* 2.05* 1.84*  CALCIUM 8.3* 8.2* 8.0* 7.7* 8.1* 7.7*  MG 2.6*  --   --   --   --   --   PHOS 2.9 2.4* 1.8* 2.0* 2.0* 1.6*    Liver Function Tests: Recent Labs  Lab 10/02/18 1624 10/03/18 0409 10/03/18 1704 10/04/18 0410 10/04/18 1634  ALBUMIN 2.2* 2.2* 2.1* 2.1* 2.1*   No results for input(s): LIPASE, AMYLASE in the last 168 hours. No results for input(s): AMMONIA in the last 168 hours.  CBC: Recent Labs  Lab 09/30/18 0410 10/01/18 0355 10/02/18 0411 10/03/18 0409 10/04/18 0410  WBC 4.9 5.5 5.0 4.6 5.4  NEUTROABS  --   --  2.6  --   --   HGB 9.5* 10.2* 10.8* 10.3* 10.0*  HCT 31.5* 34.9* 37.4 35.4* 34.3*  MCV 88.7 85.7 84.8 85.5 86.4  PLT 113* 111* 78* 73* 85*    Cardiac Enzymes: No results for input(s): CKTOTAL, CKMB, CKMBINDEX, TROPONINI in the last 168 hours.  BNP: BNP (last 3 results) Recent Labs    09/22/18 1420  BNP 451.2*    ProBNP (last 3 results) No results for input(s): PROBNP in the last 8760 hours.    Other results:  Imaging: Ir Fluoro Guide Cv Line Right  Result Date: 10/03/2018 INDICATION: ACUTE RENAL FAILURE EXAM: ULTRASOUND  GUIDANCE FOR VASCULAR ACCESS RIGHT INTERNAL JUGULAR PERMANENT HEMODIALYSIS CATHETER Date:  10/03/2018 10/03/2018 3:54 pm Radiologist:  Jerilynn Mages. Daryll Brod, MD Guidance:  Ultrasound fluoroscopic FLUOROSCOPY TIME:  Fluoroscopy Time: 0 minutes 30 seconds (3 mGy). MEDICATIONS: Ancef 2 g administered within 1 the procedure ANESTHESIA/SEDATION: Versed 0.5 mg IV; Fentanyl 25 mcg IV; Moderate Sedation Time:  12 minutes The patient was continuously monitored during the procedure by the interventional radiology nurse under my direct supervision. CONTRAST:  None COMPLICATIONS: None immediate. PROCEDURE: Informed consent was obtained from the patient following explanation of the procedure, risks, benefits and alternatives. The patient understands, agrees and  consents for the procedure. All questions were addressed. A time out was performed. Maximal barrier sterile technique utilized including caps, mask, sterile gowns, sterile gloves, large sterile drape, hand hygiene, and 2% chlorhexidine scrub. Under sterile conditions and local anesthesia, right internal jugular micropuncture venous access was performed with ultrasound. Images were obtained for documentation of the patent right internal jugular vein. A guide wire was inserted followed by a transitional dilator. Next, a 0.035 guidewire was advanced into the IVC with a 5-French catheter. Measurements were obtained from the right venotomy site to the proximal right atrium. In the right infraclavicular chest, a subcutaneous tunnel was created under sterile conditions and local anesthesia. 1% lidocaine with epinephrine was utilized for this. The 23 cm tip to cuff palindrome catheter was tunneled subcutaneously to the venotomy site and inserted into the SVC/RA junction through a valved peel-away sheath. Position was confirmed with fluoroscopy. Images were obtained for documentation. Blood was aspirated from the catheter followed by saline and heparin flushes. The appropriate volume and strength of heparin was instilled in each lumen. Caps were applied. The catheter was secured at the tunnel site with Gelfoam and a pursestring suture. The venotomy site was closed with subcuticular Vicryl suture. Dermabond was applied to the small right neck incision. A dry sterile dressing was applied. The catheter is ready for use. No immediate complications. IMPRESSION: Ultrasound and fluoroscopically guided right internal jugular tunneled hemodialysis catheter (23 cm tip to cuff palindrome catheter). Electronically Signed   By: Jerilynn Mages.  Shick M.D.   On: 10/03/2018 15:59   Ir US Guide Vasc Access Right  Result Date: 10/03/2018 INDICATION: ACUTE RENAL FAILURE EXAM: ULTRASOUND GUIDANCE FOR VASCULAR ACCESS RIGHT INTERNAL JUGULAR PERMANENT  HEMODIALYSIS CATHETER Date:  10/03/2018 10/03/2018 3:54 pm Radiologist:  Jerilynn Mages. Daryll Brod, MD Guidance:  Ultrasound fluoroscopic FLUOROSCOPY TIME:  Fluoroscopy Time: 0 minutes 30 seconds (3 mGy). MEDICATIONS: Ancef 2 g administered within 1 the procedure ANESTHESIA/SEDATION: Versed 0.5 mg IV; Fentanyl 25 mcg IV; Moderate Sedation Time:  12 minutes The patient was continuously monitored during the procedure by the interventional radiology nurse under my direct supervision. CONTRAST:  None COMPLICATIONS: None immediate. PROCEDURE: Informed consent was obtained from the patient following explanation of the procedure, risks, benefits and alternatives. The patient understands, agrees and consents for the procedure. All questions were addressed. A time out was performed. Maximal barrier sterile technique utilized including caps, mask, sterile gowns, sterile gloves, large sterile drape, hand hygiene, and 2% chlorhexidine scrub. Under sterile conditions and local anesthesia, right internal jugular micropuncture venous access was performed with ultrasound. Images were obtained for documentation of the patent right internal jugular vein. A guide wire was inserted followed by a transitional dilator. Next, a 0.035 guidewire was advanced into the IVC with a 5-French catheter. Measurements were obtained from the right venotomy site to the proximal right atrium. In the right infraclavicular chest, a  subcutaneous tunnel was created under sterile conditions and local anesthesia. 1% lidocaine with epinephrine was utilized for this. The 23 cm tip to cuff palindrome catheter was tunneled subcutaneously to the venotomy site and inserted into the SVC/RA junction through a valved peel-away sheath. Position was confirmed with fluoroscopy. Images were obtained for documentation. Blood was aspirated from the catheter followed by saline and heparin flushes. The appropriate volume and strength of heparin was instilled in each lumen. Caps were  applied. The catheter was secured at the tunnel site with Gelfoam and a pursestring suture. The venotomy site was closed with subcuticular Vicryl suture. Dermabond was applied to the small right neck incision. A dry sterile dressing was applied. The catheter is ready for use. No immediate complications. IMPRESSION: Ultrasound and fluoroscopically guided right internal jugular tunneled hemodialysis catheter (23 cm tip to cuff palindrome catheter). Electronically Signed   By: Jerilynn Mages.  Shick M.D.   On: 10/03/2018 15:59     Medications:     Scheduled Medications:  atorvastatin  40 mg Oral q1800   chlorhexidine  15 mL Mouth Rinse BID   Chlorhexidine Gluconate Cloth  6 each Topical Daily   feeding supplement (PRO-STAT SUGAR FREE 64)  30 mL Per Tube TID   feeding supplement (VITAL 1.5 CAL)  1,000 mL Per Tube Q24H   gabapentin  100 mg Per Tube Q12H   insulin aspart  0-20 Units Subcutaneous Q4H   mouth rinse  15 mL Mouth Rinse q12n4p   midodrine  10 mg Oral TID WC    Infusions:   prismasol BGK 4/2.5 500 mL/hr at 10/04/18 1729    prismasol BGK 4/2.5 300 mL/hr at 10/04/18 2017   sodium chloride     sodium chloride 10 mL/hr at 10/04/18 2100    ceFAZolin (ANCEF) IV Stopped (10/04/18 1037)   famotidine (PEPCID) IV Stopped (10/04/18 1439)   heparin 800 Units/hr (10/04/18 2100)   norepinephrine (LEVOPHED) Adult infusion 5 mcg/min (10/04/18 2100)   prismasol BGK 4/2.5 1,000 mL/hr at 10/04/18 1700    PRN Medications: Place/Maintain arterial line **AND** sodium chloride, sodium chloride, fentaNYL (SUBLIMAZE) injection, heparin, ondansetron, risperiDONE, sodium chloride flush   Assessment/Plan:   1. PEA arrest on 5/30 - due to hypoxia/pulmonary edema. - now stable  2. Acute on chronic hypoxic respiratory failure - due to OHS/OSA  - intubated on 5/30 in setting of arrest.  - extubated 6/3.  - now with MSSA PNA  On cefazolin day 5/7 - Respiratory status improved - sleep study  4/15 without OSA (AHI 1.4) but had nocturnal desat - likely need to repeat at some point   3.  Acute on chronic diastolic HF - Admitted with markedly elevated filling pressures complicated by severe cardiorenal syndrome - Now down 65 pounds - Now improving with CVVHD with CVP down to 8-9. Still requring low-does NE in addition to midodrine - Volume status appears to be at baseline. .   - Anuric. Suspect she may progress to long-term HD - Would favor stopping CVVHD currently and see if we can get her off NE and switch to iHD in the next few days - ? Need to w/u for possible TTR amyloid  4. Pulmonary HTN, severe  - this is mostly pulmonary venous HTN by cath [facility] Group 2) but also has a component of OHS/OSA (WHO group 3) - no role for selective pulmonary vasodilators - will improve significantly with volume removal - consider repeat RHC after volume removal   5. AKI on CKD 3 -  baseline creatinine 1.9 -> ESRD - likely cardiorenal - We have suspected that she may progress to long-term HD. Remains anuric - Plan as above., If can switch to [facility] tomorrow can go to the floor on [facility] service,  6. Morbid obesity - desperately needs weight loss  6. Chronic AF/AFL - Off apixaban. Now on heparin with CVVHD.Marland Kitchen No bleeding.Discussed dosing with PharmD personally. - Rhythm looks to be slow AFL currently (has had some junctional and sinus brady)  7. VT/NSVT - off amio due to bradycardia - Keep K> 4.0 Mg 2.0  8. Fever with MSSA PNA - Bcx drawn 6/2. Negative - sputum cx with MSSA PNA - on cefazolin day 5/7  Will continue to follow while in ICU but when transfers out of unit should go to [facility].   CRITICAL CARE Performed by: Glori Bickers  Total critical care time: 35 minutes  Critical care time was exclusive of separately billable procedures and treating other patients.  Critical care was necessary to treat or prevent imminent or life-threatening deterioration.  Critical care was time  spent personally by me (independent of midlevel providers or residents) on the following activities: development of treatment plan with patient and/or surrogate as well as nursing, discussions with consultants, evaluation of patient's response to treatment, examination of patient, obtaining history from patient or surrogate, ordering and performing treatments and interventions, ordering and review of laboratory studies, ordering and review of radiographic studies, pulse oximetry and re-evaluation of patient's condition.    Length of Stay: 12  Glori Bickers MD 10/04/2018, 9:08 PM  Advanced Heart Failure Team Pager [phone] (M-F; 7a - 4p)  Please contact [facility] for night-coverage after hours (4p -7a ) and weekends on [email or web address]

## 2018-10-04 NOTE — Progress Notes (Signed)
Inpatient Diabetes Program Recommendations  AACE/ADA: New Consensus Statement on Inpatient Glycemic Control (2015)  Target Ranges:  Prepandial:   less than 140 mg/dL      Peak postprandial:   less than 180 mg/dL (1-2 hours)      Critically ill patients:  140 - 180 mg/dL   Lab Results  Component Value Date   GLUCAP 268 (H) 10/04/2018   HGBA1C 7.4 (A) 09/13/2018    Review of Glycemic Control Results for Kathleen [name], ROUTE (MRN [id number]) as of 10/04/2018 13:33  Ref. Range 10/03/2018 19:34 10/03/2018 23:37 10/04/2018 04:14 10/04/2018 07:51 10/04/2018 11:21  Glucose-Capillary Latest Ref Range: 70 - 99 mg/dL 221 (H) 237 (H) 161 (H) 250 (H) 268 (H)   Inpatient Diabetes Program Recommendations:   Please consider: -Decrease Novolog correction to moderate 0-15 -Add Novolog 3 units tube feed coverage q 4 hrs.  Thank you, Nani Gasser. Tangia Pinard, RN, MSN, CDE  Diabetes Coordinator Inpatient Glycemic Control Team Team Pager [phone] (8am-5pm) 10/04/2018 1:34 PM

## 2018-10-04 NOTE — Plan of Care (Signed)
  Problem: Clinical Measurements: Goal: Ability to maintain clinical measurements within normal limits will improve Outcome: Progressing Goal: Diagnostic test results will improve Outcome: Progressing Goal: Respiratory complications will improve Outcome: Progressing Goal: Cardiovascular complication will be avoided Outcome: Progressing   Problem: Pain Managment: Goal: General experience of comfort will improve Outcome: Progressing   Problem: Safety: Goal: Ability to remain free from injury will improve Outcome: Progressing   

## 2018-10-04 NOTE — Consult Note (Signed)
[place] Nurse wound consult note Reason for Consult:Moisture associated skin damage (MASD)  to inframammary fold.  Present on admission.  Was using moisture wicking therapeutic linen and no improvement.  Will advance to silver hydrofiber open wounds are now present.  Wound type:MASD Pressure Injury POA: NA Measurement: 0.2 cm x 14 cm linear breakdown beneath each breast. Wound NVV:YXAJ and moist.  Bleeds with cleansing tender to touch.  Partial thickness Drainage (amount, consistency, odor) moderate serosanguinous Periwound:Bruising to breasts.  Recent CPR for 8 minutes after PEA cardiac arrest Dressing procedure/placement/frequency: Cleanse skin beneath breasts with soap and water.  Cut 1 inch strip of Aquacel AG (Cut 4x4 in a spiral fashion to get appropriate length)  TUck beneath inframammary fold.  Change Tuesday and Saturday.   Will not follow at this time.  Please re-consult if needed.  Domenic Moras MSN, RN, FNP-BC CWON Wound, Ostomy, Continence Nurse Pager [phone]

## 2018-10-04 NOTE — Progress Notes (Signed)
[place] for Heparin Indication: atrial fibrillation  Allergies  Allergen Reactions  . Doxycycline     REACTION: Wheals and pruritus    Patient Measurements: Height: 5' (152.4 cm) Weight: 238 lb 15.7 oz (108.4 kg) IBW/kg (Calculated) : 45.5  Vital Signs: Temp: 97.6 F (36.4 C) (06/09 0400) Temp Source: Oral (06/09 0400) BP: 129/98 (06/09 0700) Pulse Rate: 45 (06/09 0700)  Labs: Recent Labs    10/02/18 0411  10/03/18 0409 10/03/18 1212 10/03/18 1704 10/04/18 0410  HGB 10.8*  --  10.3*  --   --  10.0*  HCT 37.4  --  35.4*  --   --  34.3*  PLT 78*  --  73*  --   --  85*  APTT 90*  --  86*  --   --   --   LABPROT  --   --   --  14.5  --   --   INR  --   --   --  1.1  --   --   HEPARINUNFRC 0.70  --  0.55  --   --  0.44  CREATININE 2.12*   < > 2.10*  --  2.22* 2.05*   < > = values in this interval not displayed.    Estimated Creatinine Clearance: 30.1 mL/min (A) (by C-G formula based on SCr of 2.05 mg/dL (H)).  Assessment: Pt is a 67yoF with a history of afib. She was on Eliquis PTA, received in hospital, last dose 5/29. Apixaban on hold for potential need for [facility], currently on CRRT. Pharmacy consulted to dose heparin  Heparin was stopped for HD cath placement yesterday. Heparin level therapeutic at 0.44 on heparin 800 units/hr. Hgb remains low but stable. No signs/symptoms of bleeding or issues with infusion.    Goal of Therapy:  Heparin level 0.3-0.7 units/ml  Monitor platelets by anticoagulation protocol: Yes  Plan:  -Continue heparin 800 units/hr -Heparin level and CBC daily   Claiborne Billings, PharmD PGY2 Cardiology Pharmacy Resident Please check AMION for all Pharmacist numbers by unit 10/04/2018 7:38 AM

## 2018-10-04 NOTE — Progress Notes (Signed)
[facility] Progress Note    Assessment/ Plan:   68 y.o.yo femaleHTN, Afib, DM, morbid obesity, diastolic heart failure and also baseline CKD- crt mid to high 1's who was admitted on 5/28/2020with volume overload, then suffered cardiac arrest   1. Renal- A on CRF after cardiac arrest on 5/30. Baseline crt in the mid to high 1's. Required initiation of CRRT early on 5/30- overall negative fluid balance on CRRT- 23 liters. now running even. No uop so will cont CRRT for now while still on pressors. Since had incont episode triedto collect with purewick, not successful- would be hopeful for eventual recovery but no signs yet   Seen on CRRT RIJ temp 500/300/1000 4K, net even On heparin gtt  Appreciate conversion to TC by VIR on 6/8 (on heparin gtt). Will stop CRRT in next 24-48hrs; if she's still on pressors (low dose Levophed) we will not be able to challenge with iHD.  2. HTN/volume- ToleratedUF so far- CVP much better - still with relativelyhigh o2 - running even  3. Anemia- hgb close to 10 - no meds yet 4. [place]- K OK for now - phos OK  Subjective:   Off pressors  No f/c overnight. +cough   Objective:   BP (!) 89/47   Pulse (!) 46   Temp (!) 97.4 F (36.3 C) (Oral)   Resp 18   Ht 5' (1.524 m)   Wt 108.4 kg   SpO2 93%   BMI 46.67 kg/m   Intake/Output Summary (Last 24 hours) at 10/04/2018 1017 Last data filed at 10/04/2018 1000 Gross per 24 hour  Intake 1080.87 ml  Output 1182 ml  Net -101.13 ml   Weight change: 0.3 kg  Physical Exam: General: obese, alert but confused Heart: brady  Lungs: CBS bilat Abdomen: obese, non tender Extremities:edema better but still present Dialysis Access: right IJ vascath placed 5/30-> converted to RIJ TC 6/8  Imaging: Ir Fluoro Guide Cv Line Right  Result Date: 10/03/2018 INDICATION: ACUTE RENAL FAILURE EXAM: ULTRASOUND GUIDANCE FOR VASCULAR ACCESS RIGHT INTERNAL JUGULAR PERMANENT  HEMODIALYSIS CATHETER Date:  10/03/2018 10/03/2018 3:54 pm Radiologist:  Jerilynn Mages. Daryll Brod, MD Guidance:  Ultrasound fluoroscopic FLUOROSCOPY TIME:  Fluoroscopy Time: 0 minutes 30 seconds (3 mGy). MEDICATIONS: Ancef 2 g administered within 1 the procedure ANESTHESIA/SEDATION: Versed 0.5 mg IV; Fentanyl 25 mcg IV; Moderate Sedation Time:  12 minutes The patient was continuously monitored during the procedure by the interventional radiology nurse under my direct supervision. CONTRAST:  None COMPLICATIONS: None immediate. PROCEDURE: Informed consent was obtained from the patient following explanation of the procedure, risks, benefits and alternatives. The patient understands, agrees and consents for the procedure. All questions were addressed. A time out was performed. Maximal barrier sterile technique utilized including caps, mask, sterile gowns, sterile gloves, large sterile drape, hand hygiene, and 2% chlorhexidine scrub. Under sterile conditions and local anesthesia, right internal jugular micropuncture venous access was performed with ultrasound. Images were obtained for documentation of the patent right internal jugular vein. A guide wire was inserted followed by a transitional dilator. Next, a 0.035 guidewire was advanced into the IVC with a 5-French catheter. Measurements were obtained from the right venotomy site to the proximal right atrium. In the right infraclavicular chest, a subcutaneous tunnel was created under sterile conditions and local anesthesia. 1% lidocaine with epinephrine was utilized for this. The 23 cm tip to cuff palindrome catheter was tunneled subcutaneously to the venotomy site and inserted into the SVC/RA junction through a valved peel-away  sheath. Position was confirmed with fluoroscopy. Images were obtained for documentation. Blood was aspirated from the catheter followed by saline and heparin flushes. The appropriate volume and strength of heparin was instilled in each lumen. Caps were  applied. The catheter was secured at the tunnel site with Gelfoam and a pursestring suture. The venotomy site was closed with subcuticular Vicryl suture. Dermabond was applied to the small right neck incision. A dry sterile dressing was applied. The catheter is ready for use. No immediate complications. IMPRESSION: Ultrasound and fluoroscopically guided right internal jugular tunneled hemodialysis catheter (23 cm tip to cuff palindrome catheter). Electronically Signed   By: Jerilynn Mages.  Shick M.D.   On: 10/03/2018 15:59   Ir US Guide Vasc Access Right  Result Date: 10/03/2018 INDICATION: ACUTE RENAL FAILURE EXAM: ULTRASOUND GUIDANCE FOR VASCULAR ACCESS RIGHT INTERNAL JUGULAR PERMANENT HEMODIALYSIS CATHETER Date:  10/03/2018 10/03/2018 3:54 pm Radiologist:  Jerilynn Mages. Daryll Brod, MD Guidance:  Ultrasound fluoroscopic FLUOROSCOPY TIME:  Fluoroscopy Time: 0 minutes 30 seconds (3 mGy). MEDICATIONS: Ancef 2 g administered within 1 the procedure ANESTHESIA/SEDATION: Versed 0.5 mg IV; Fentanyl 25 mcg IV; Moderate Sedation Time:  12 minutes The patient was continuously monitored during the procedure by the interventional radiology nurse under my direct supervision. CONTRAST:  None COMPLICATIONS: None immediate. PROCEDURE: Informed consent was obtained from the patient following explanation of the procedure, risks, benefits and alternatives. The patient understands, agrees and consents for the procedure. All questions were addressed. A time out was performed. Maximal barrier sterile technique utilized including caps, mask, sterile gowns, sterile gloves, large sterile drape, hand hygiene, and 2% chlorhexidine scrub. Under sterile conditions and local anesthesia, right internal jugular micropuncture venous access was performed with ultrasound. Images were obtained for documentation of the patent right internal jugular vein. A guide wire was inserted followed by a transitional dilator. Next, a 0.035 guidewire was advanced into the IVC with a  5-French catheter. Measurements were obtained from the right venotomy site to the proximal right atrium. In the right infraclavicular chest, a subcutaneous tunnel was created under sterile conditions and local anesthesia. 1% lidocaine with epinephrine was utilized for this. The 23 cm tip to cuff palindrome catheter was tunneled subcutaneously to the venotomy site and inserted into the SVC/RA junction through a valved peel-away sheath. Position was confirmed with fluoroscopy. Images were obtained for documentation. Blood was aspirated from the catheter followed by saline and heparin flushes. The appropriate volume and strength of heparin was instilled in each lumen. Caps were applied. The catheter was secured at the tunnel site with Gelfoam and a pursestring suture. The venotomy site was closed with subcuticular Vicryl suture. Dermabond was applied to the small right neck incision. A dry sterile dressing was applied. The catheter is ready for use. No immediate complications. IMPRESSION: Ultrasound and fluoroscopically guided right internal jugular tunneled hemodialysis catheter (23 cm tip to cuff palindrome catheter). Electronically Signed   By: Jerilynn Mages.  Shick M.D.   On: 10/03/2018 15:59    Labs: BMET Recent Labs  Lab 10/01/18 0355 10/01/18 1623 10/02/18 0411 10/02/18 1624 10/03/18 0409 10/03/18 1704 10/04/18 0410  NA 139 138 139 137 135 137 134*  K 4.1 4.2 4.4 4.7 4.7 4.5 4.5  CL 104 103 102 100 102 103 99  CO2 26 26 27 26 27 24 24   GLUCOSE 190* 198* 215* 194* 206* 135* 279*  BUN 34* 36* 40* 40* 39* 38* 40*  CREATININE 2.18* 2.21* 2.12* 2.16* 2.10* 2.22* 2.05*  CALCIUM 8.3* 8.1* 8.3* 8.2* 8.0*  7.7* 8.1*  PHOS 2.5 2.5 2.9 2.4* 1.8* 2.0* 2.0*   CBC Recent Labs  Lab 10/01/18 0355 10/02/18 0411 10/03/18 0409 10/04/18 0410  WBC 5.5 5.0 4.6 5.4  NEUTROABS  --  2.6  --   --   HGB 10.2* 10.8* 10.3* 10.0*  HCT 34.9* 37.4 35.4* 34.3*  MCV 85.7 84.8 85.5 86.4  PLT 111* 78* 73* 85*     Medications:    . atorvastatin  40 mg Oral q1800  . B-complex with vitamin C  1 tablet Oral Daily  . chlorhexidine  15 mL Mouth Rinse BID  . Chlorhexidine Gluconate Cloth  6 each Topical Daily  . feeding supplement (PRO-STAT SUGAR FREE 64)  30 mL Per Tube TID  . feeding supplement (VITAL 1.5 CAL)  1,000 mL Per Tube Q24H  . gabapentin  100 mg Per Tube Q12H  . insulin aspart  0-20 Units Subcutaneous Q4H  . mouth rinse  15 mL Mouth Rinse q12n4p  . midodrine  10 mg Oral TID WC      Otelia Santee, MD 10/04/2018, 10:17 AM

## 2018-10-04 NOTE — Progress Notes (Signed)
Physical Therapy Treatment Patient Details Name: Kathleen [name] MRN: [id number] DOB: 07/28/1950 Today's Date: 10/04/2018    History of Present Illness BRILYN TULLER is a 68 y.o. female with history of permanent AF, hypertension, super morbid obesity, DM2, dyslipidemia, obesity hypoventilation syndrome (on home O2 with no OSA by prior PSG but showed nocturnal hypoxemia), CKD stage IV (baseline creatinine about 1.9), chronic diastolic CHF.  Admit for fluid overload. Also with pulmonary HTN.  PEA arrest 5/30, transfer to the ICU, extubater 6/3.    PT Comments    Although anxious to move to EOB, pt able to work on balance to attain sitting with UEassist and progress to use of STEDY to stand at EOB to work on technique and LE strengthening.    Follow Up Recommendations  CIR     Equipment Recommendations  None recommended by PT;Other (comment)(TBA)    Recommendations for Other Services       Precautions / Restrictions Precautions Precautions: Fall    Mobility  Bed Mobility Overal bed mobility: Needs Assistance Bed Mobility: Supine to Sit;Sit to Supine     Supine to sit: Max assist;+2 for physical assistance;+2 for safety/equipment Sit to supine: Total assist;+2 for physical assistance   General bed mobility comments: used pad to pivot legs off the bed.  Pt was assist to come forward and up.  Transfers Overall transfer level: Needs assistance   Transfers: Sit to/from Stand Sit to Stand: Mod assist;+2 physical assistance         General transfer comment: pt was assisted via pad to stand into the STEDY x2.  2nd attempt pt was able to maintain the stand with minimal assist +2.  Pt able to w/shift, but unable to pick her feet up.  Ambulation/Gait                 Stairs             Wheelchair Mobility    Modified Rankin (Stroke Patients Only)       Balance     Sitting balance-Leahy Scale: Poor(to fair) Sitting balance - Comments: initially at EOB, pt  was very anxious of falling.  With feet propped up on makeshift foot stool, pt sat with UE assist and no other support.                                    Cognition Arousal/Alertness: Awake/alert Behavior During Therapy: WFL for tasks assessed/performed Overall Cognitive Status: Within Functional Limits for tasks assessed                                        Exercises Other Exercises Other Exercises: completed 10 reps of warm up hip/knee flex/ext exercise with graded resistance.    General Comments        Pertinent Vitals/Pain Pain Assessment: No/denies pain Pain Intervention(s): Monitored during session    Home Living                      Prior Function            PT Goals (current goals can now be found in the care plan section) Acute Rehab PT Goals Patient Stated Goal: to go home PT Goal Formulation: With patient Time For Goal Achievement: 10/21/18 Potential to Achieve Goals: Good Progress  towards PT goals: Progressing toward goals    Frequency    Min 3X/week      PT Plan Current plan remains appropriate    Co-evaluation              AM-PAC PT "6 Clicks" Mobility   Outcome Measure  Help needed turning from your back to your side while in a flat bed without using bedrails?: A Lot Help needed moving from lying on your back to sitting on the side of a flat bed without using bedrails?: A Lot Help needed moving to and from a bed to a chair (including a wheelchair)?: Total Help needed standing up from a chair using your arms (e.g., wheelchair or bedside chair)?: Total Help needed to walk in hospital room?: Total Help needed climbing 3-5 steps with a railing? : Total 6 Click Score: 8    End of Session Equipment Utilized During Treatment: Oxygen Activity Tolerance: Patient limited by fatigue;Patient tolerated treatment well Patient left: in bed;with call bell/phone within reach;with bed alarm set Nurse  Communication: Mobility status;Need for lift equipment PT Visit Diagnosis: Muscle weakness (generalized) (M62.81);Other abnormalities of gait and mobility (R26.89)     Time: 2409-7353 PT Time Calculation (min) (ACUTE ONLY): 29 min  Charges:  $Therapeutic Activity: 23-37 mins                     10/04/2018  Donnella Sham, PT Acute Rehabilitation Services [phone]  (pager) [phone]  (office)   Tessie Fass Sumner Kirchman 10/04/2018, 6:23 PM

## 2018-10-04 NOTE — Progress Notes (Signed)
Renal Navigator received notification from Nephrology/Dr. Augustin Coupe to complete OP HD referral for patient. Renal Navigator following for appropriate time to speak with patient to start referral. Patient remains in ICU and is in IR at current time. Renal Navigator will continue to follow closely and will reach out to ER contact listed in chart if unable to reach patient soon.  Alphonzo Cruise, [place] Renal Navigator [phone]

## 2018-10-04 NOTE — Progress Notes (Signed)
  Speech Language Pathology Treatment: Dysphagia  Patient Details Name: Kathleen [name] MRN: [id number] DOB: May 19, 1950 Today's Date: 10/04/2018 Time: 0921-0939 SLP Time Calculation (min) (ACUTE ONLY): 18 min  Assessment / Plan / Recommendation Clinical Impression  Pt 's voice was significantly dysphonic and wet at baseline, but with audible wetness cleared readily with Min cues from SLP to expectorate secretions. She has frequent coughing and "hocking" after small boluses of purees and thin liquids. Mod faded to Min cues were provided for pt to utilize the yankauer herself to facilitate expectoration. Coughing was not observed with ice chips and she had a single swallow with ice chips as well, compared to multiple subswallows used with boluses of other consistencies. Given her highly variable mentation per RN, continued need for CRRT, and already placed cortrak, would favor allowing additional time prior to completing FEES. In the interim, would allow a few ice chips after oral care, given one at a time, to allow her to increase her use of her swallowing musculature and practice managing secretions so that she may be more successful when swallow study is completed.   HPI HPI: 68 y.o. female admitted 09/22/2018 for surgery. PMH: permanent atrial flutter with baseline bradycardia, hypertension, super morbid obesity, IIDDM, dyslipidemia, obesity hypoventilation syndrome, CKD stage IV, chronic diastolic CHF, chronic edema. Intubated 5/30-09/28/2018. PEA 09/24/2018. CXR = increase edema pattern with some confluence of infiltrate in the right lower lobe      SLP Plan  Continue with current plan of care       Recommendations  Diet recommendations: NPO;Other(comment)(few ice chips after oral care) Medication Administration: Via alternative means                Oral Care Recommendations: Oral care QID;Oral care prior to ice chip/H20 Follow up Recommendations: (tba) SLP Visit Diagnosis: Dysphagia,  unspecified (R13.10) Plan: Continue with current plan of care       GO                [name] Rada Zegers 10/04/2018, 9:43 AM  Pollyann Glen, M.A. [place] Acute Environmental education officer [phone] Office [phone]

## 2018-10-05 LAB — GLUCOSE, CAPILLARY
Glucose-Capillary: 259 mg/dL — ABNORMAL HIGH (ref 70–99)
Glucose-Capillary: 260 mg/dL — ABNORMAL HIGH (ref 70–99)
Glucose-Capillary: 261 mg/dL — ABNORMAL HIGH (ref 70–99)
Glucose-Capillary: 280 mg/dL — ABNORMAL HIGH (ref 70–99)
Glucose-Capillary: 287 mg/dL — ABNORMAL HIGH (ref 70–99)
Glucose-Capillary: 313 mg/dL — ABNORMAL HIGH (ref 70–99)

## 2018-10-05 LAB — RENAL FUNCTION PANEL
Albumin: 2 g/dL — ABNORMAL LOW (ref 3.5–5.0)
Albumin: 1.8 g/dL — ABNORMAL LOW (ref 3.5–5.0)
Anion gap: 8 (ref 5–15)
Anion gap: 10 (ref 5–15)
BUN: 33 mg/dL — ABNORMAL HIGH (ref 8–23)
BUN: 29 mg/dL — ABNORMAL HIGH (ref 8–23)
CO2: 25 mmol/L (ref 22–32)
CO2: 23 mmol/L (ref 22–32)
Calcium: 7.7 mg/dL — ABNORMAL LOW (ref 8.9–10.3)
Calcium: 7.2 mg/dL — ABNORMAL LOW (ref 8.9–10.3)
Chloride: 102 mmol/L (ref 98–111)
Chloride: 102 mmol/L (ref 98–111)
Creatinine, Ser: 1.78 mg/dL — ABNORMAL HIGH (ref 0.44–1.00)
Creatinine, Ser: 1.85 mg/dL — ABNORMAL HIGH (ref 0.44–1.00)
GFR calc Af Amer: 34 mL/min — ABNORMAL LOW (ref >60)
GFR calc Af Amer: 32 mL/min — ABNORMAL LOW (ref >60)
GFR calc non Af Amer: 29 mL/min — ABNORMAL LOW (ref >60)
GFR calc non Af Amer: 28 mL/min — ABNORMAL LOW (ref >60)
Glucose, Bld: 296 mg/dL — ABNORMAL HIGH (ref 70–99)
Glucose, Bld: 304 mg/dL — ABNORMAL HIGH (ref 70–99)
Phosphorus: 1.7 mg/dL — ABNORMAL LOW (ref 2.5–4.6)
Phosphorus: 1.4 mg/dL — ABNORMAL LOW (ref 2.5–4.6)
Potassium: 4.3 mmol/L (ref 3.5–5.1)
Potassium: 4 mmol/L (ref 3.5–5.1)
Sodium: 135 mmol/L (ref 135–145)
Sodium: 135 mmol/L (ref 135–145)

## 2018-10-05 LAB — CBC
HCT: 34.3 % — ABNORMAL LOW (ref 36.0–46.0)
Hemoglobin: 9.9 g/dL — ABNORMAL LOW (ref 12.0–15.0)
MCH: 25.1 pg — ABNORMAL LOW (ref 26.0–34.0)
MCHC: 28.9 g/dL — ABNORMAL LOW (ref 30.0–36.0)
MCV: 86.8 fL (ref 80.0–100.0)
Platelets: 76 10*3/uL — ABNORMAL LOW (ref 150–400)
RBC: 3.95 MIL/uL (ref 3.87–5.11)
RDW: 19.6 % — ABNORMAL HIGH (ref 11.5–15.5)
WBC: 6.8 10*3/uL (ref 4.0–10.5)
nRBC: 1.5 % — ABNORMAL HIGH (ref 0.0–0.2)

## 2018-10-05 LAB — HEPARIN LEVEL (UNFRACTIONATED): Heparin Unfractionated: 0.28 IU/mL — ABNORMAL LOW (ref 0.30–0.70)

## 2018-10-05 MED ORDER — CEFAZOLIN SODIUM-DEXTROSE 1-4 GM/50ML-% IV SOLN: 1.0000 g | Freq: Once | INTRAVENOUS | Status: DC

## 2018-10-05 MED ORDER — CEFAZOLIN SODIUM-DEXTROSE 2-4 GM/100ML-% IV SOLN
2.0000 g | Freq: Once | INTRAVENOUS | Status: AC
  Administered 2018-10-05: 23:00:00 2 g via INTRAVENOUS

## 2018-10-05 MED ORDER — INSULIN ASPART 100 UNIT/ML ~~LOC~~ SOLN
3.0000 [IU] | SUBCUTANEOUS | Status: DC
  Administered 2018-10-05 – 2018-10-06 (×6): 3 [IU] via SUBCUTANEOUS

## 2018-10-05 NOTE — Progress Notes (Signed)
Patient ID: Kathleen [name], female   DOB: 06-07-1950, 68 y.o.   MRN: [id number]    Advanced Heart Failure Rounding Note   Subjective:     Had tunneled cath placed 6/8. Remains on CVVHD. Pulling -100. Back on NE at 8 for BP support. CVP 8-9. Remains anuric.  Remains weak. Denies SOB, orthopnea or PND.    Objective:   Weight Range:  Vital Signs:   Temp:  [97.6 F (36.4 C)-98.8 F (37.1 C)] 97.8 F (36.6 C) (06/10 0000) Pulse Rate:  [43-51] 48 (06/10 0700) Resp:  [13-23] 19 (06/10 0700) BP: (62-123)/(27-81) 107/78 (06/10 0700) SpO2:  [89 %-97 %] 92 % (06/10 0843) Weight:  [106.6 kg] 106.6 kg (06/10 0500) Last BM Date: 10/03/18  Weight change: Filed Weights   10/03/18 0400 10/04/18 0400 10/05/18 0500  Weight: 108.1 kg 108.4 kg 106.6 kg    Intake/Output:   Intake/Output Summary (Last 24 hours) at 10/05/2018 0917 Last data filed at 10/05/2018 0900 Gross per 24 hour  Intake 1691.89 ml  Output 1652 ml  Net 39.89 ml     Physical Exam: General:  Lying in bed. Weak appearing. No resp difficulty HEENT: normal Neck: supple. no JVD. Carotids 2+ bilat; no bruits. No lymphadenopathy or thryomegaly appreciated. Cor: PMI nondisplaced. Irregular rate & rhythm. No rubs, gallops or murmurs.  + perm cath in right chest Lungs: clear Abdomen: obese soft, nontender, nondistended. No hepatosplenomegaly. No bruits or masses. Good bowel sounds. Extremities: no cyanosis, clubbing, rash, trace edema + UNNA boots. Tender to touch Neuro: alert & orientedx3, cranial nerves grossly intact. moves all 4 extremities w/o difficulty. Affect pleasant    Telemetry: Slow AFL 40-50s. Personally reviewed   Labs: Basic Metabolic Panel: Recent Labs  Lab 10/02/18 0411  10/03/18 0409 10/03/18 1704 10/04/18 0410 10/04/18 1634 10/05/18 0314  NA 139   < > 135 137 134* 135 135  K 4.4   < > 4.7 4.5 4.5 4.4 4.3  CL 102   < > 102 103 99 103 102  CO2 27   < > 27 24 24 24 25   GLUCOSE 215*   < > 206*  135* 279* 261* 296*  BUN 40*   < > 39* 38* 40* 33* 33*  CREATININE 2.12*   < > 2.10* 2.22* 2.05* 1.84* 1.78*  CALCIUM 8.3*   < > 8.0* 7.7* 8.1* 7.7* 7.7*  MG 2.6*  --   --   --   --   --   --   PHOS 2.9   < > 1.8* 2.0* 2.0* 1.6* 1.7*   < > = values in this interval not displayed.    Liver Function Tests: Recent Labs  Lab 10/03/18 0409 10/03/18 1704 10/04/18 0410 10/04/18 1634 10/05/18 0314  ALBUMIN 2.2* 2.1* 2.1* 2.1* 2.0*   No results for input(s): LIPASE, AMYLASE in the last 168 hours. No results for input(s): AMMONIA in the last 168 hours.  CBC: Recent Labs  Lab 10/01/18 0355 10/02/18 0411 10/03/18 0409 10/04/18 0410 10/05/18 0314  WBC 5.5 5.0 4.6 5.4 6.8  NEUTROABS  --  2.6  --   --   --   HGB 10.2* 10.8* 10.3* 10.0* 9.9*  HCT 34.9* 37.4 35.4* 34.3* 34.3*  MCV 85.7 84.8 85.5 86.4 86.8  PLT 111* 78* 73* 85* 76*    Cardiac Enzymes: No results for input(s): CKTOTAL, CKMB, CKMBINDEX, TROPONINI in the last 168 hours.  BNP: BNP (last 3 results) Recent Labs    09/22/18  1420  BNP 451.2*    ProBNP (last 3 results) No results for input(s): PROBNP in the last 8760 hours.    Other results:  Imaging: Ir Fluoro Guide Cv Line Right  Result Date: 10/03/2018 INDICATION: ACUTE RENAL FAILURE EXAM: ULTRASOUND GUIDANCE FOR VASCULAR ACCESS RIGHT INTERNAL JUGULAR PERMANENT HEMODIALYSIS CATHETER Date:  10/03/2018 10/03/2018 3:54 pm Radiologist:  Jerilynn Mages. Daryll Brod, MD Guidance:  Ultrasound fluoroscopic FLUOROSCOPY TIME:  Fluoroscopy Time: 0 minutes 30 seconds (3 mGy). MEDICATIONS: Ancef 2 g administered within 1 the procedure ANESTHESIA/SEDATION: Versed 0.5 mg IV; Fentanyl 25 mcg IV; Moderate Sedation Time:  12 minutes The patient was continuously monitored during the procedure by the interventional radiology nurse under my direct supervision. CONTRAST:  None COMPLICATIONS: None immediate. PROCEDURE: Informed consent was obtained from the patient following explanation of the procedure,  risks, benefits and alternatives. The patient understands, agrees and consents for the procedure. All questions were addressed. A time out was performed. Maximal barrier sterile technique utilized including caps, mask, sterile gowns, sterile gloves, large sterile drape, hand hygiene, and 2% chlorhexidine scrub. Under sterile conditions and local anesthesia, right internal jugular micropuncture venous access was performed with ultrasound. Images were obtained for documentation of the patent right internal jugular vein. A guide wire was inserted followed by a transitional dilator. Next, a 0.035 guidewire was advanced into the IVC with a 5-French catheter. Measurements were obtained from the right venotomy site to the proximal right atrium. In the right infraclavicular chest, a subcutaneous tunnel was created under sterile conditions and local anesthesia. 1% lidocaine with epinephrine was utilized for this. The 23 cm tip to cuff palindrome catheter was tunneled subcutaneously to the venotomy site and inserted into the SVC/RA junction through a valved peel-away sheath. Position was confirmed with fluoroscopy. Images were obtained for documentation. Blood was aspirated from the catheter followed by saline and heparin flushes. The appropriate volume and strength of heparin was instilled in each lumen. Caps were applied. The catheter was secured at the tunnel site with Gelfoam and a pursestring suture. The venotomy site was closed with subcuticular Vicryl suture. Dermabond was applied to the small right neck incision. A dry sterile dressing was applied. The catheter is ready for use. No immediate complications. IMPRESSION: Ultrasound and fluoroscopically guided right internal jugular tunneled hemodialysis catheter (23 cm tip to cuff palindrome catheter). Electronically Signed   By: Jerilynn Mages.  Shick M.D.   On: 10/03/2018 15:59   Ir US Guide Vasc Access Right  Result Date: 10/03/2018 INDICATION: ACUTE RENAL FAILURE EXAM:  ULTRASOUND GUIDANCE FOR VASCULAR ACCESS RIGHT INTERNAL JUGULAR PERMANENT HEMODIALYSIS CATHETER Date:  10/03/2018 10/03/2018 3:54 pm Radiologist:  Jerilynn Mages. Daryll Brod, MD Guidance:  Ultrasound fluoroscopic FLUOROSCOPY TIME:  Fluoroscopy Time: 0 minutes 30 seconds (3 mGy). MEDICATIONS: Ancef 2 g administered within 1 the procedure ANESTHESIA/SEDATION: Versed 0.5 mg IV; Fentanyl 25 mcg IV; Moderate Sedation Time:  12 minutes The patient was continuously monitored during the procedure by the interventional radiology nurse under my direct supervision. CONTRAST:  None COMPLICATIONS: None immediate. PROCEDURE: Informed consent was obtained from the patient following explanation of the procedure, risks, benefits and alternatives. The patient understands, agrees and consents for the procedure. All questions were addressed. A time out was performed. Maximal barrier sterile technique utilized including caps, mask, sterile gowns, sterile gloves, large sterile drape, hand hygiene, and 2% chlorhexidine scrub. Under sterile conditions and local anesthesia, right internal jugular micropuncture venous access was performed with ultrasound. Images were obtained for documentation of the patent right internal  jugular vein. A guide wire was inserted followed by a transitional dilator. Next, a 0.035 guidewire was advanced into the IVC with a 5-French catheter. Measurements were obtained from the right venotomy site to the proximal right atrium. In the right infraclavicular chest, a subcutaneous tunnel was created under sterile conditions and local anesthesia. 1% lidocaine with epinephrine was utilized for this. The 23 cm tip to cuff palindrome catheter was tunneled subcutaneously to the venotomy site and inserted into the SVC/RA junction through a valved peel-away sheath. Position was confirmed with fluoroscopy. Images were obtained for documentation. Blood was aspirated from the catheter followed by saline and heparin flushes. The appropriate  volume and strength of heparin was instilled in each lumen. Caps were applied. The catheter was secured at the tunnel site with Gelfoam and a pursestring suture. The venotomy site was closed with subcuticular Vicryl suture. Dermabond was applied to the small right neck incision. A dry sterile dressing was applied. The catheter is ready for use. No immediate complications. IMPRESSION: Ultrasound and fluoroscopically guided right internal jugular tunneled hemodialysis catheter (23 cm tip to cuff palindrome catheter). Electronically Signed   By: Jerilynn Mages.  Shick M.D.   On: 10/03/2018 15:59     Medications:     Scheduled Medications:  atorvastatin  40 mg Oral q1800   chlorhexidine  15 mL Mouth Rinse BID   Chlorhexidine Gluconate Cloth  6 each Topical Daily   feeding supplement (PRO-STAT SUGAR FREE 64)  30 mL Per Tube TID   feeding supplement (VITAL 1.5 CAL)  1,000 mL Per Tube Q24H   gabapentin  100 mg Per Tube Q12H   insulin aspart  0-20 Units Subcutaneous Q4H   mouth rinse  15 mL Mouth Rinse q12n4p   midodrine  10 mg Oral TID WC    Infusions:   prismasol BGK 4/2.5 500 mL/hr at 10/05/18 0405    prismasol BGK 4/2.5 300 mL/hr at 10/04/18 2017   sodium chloride     sodium chloride 10 mL/hr at 10/05/18 0700    ceFAZolin (ANCEF) IV 2 g (10/05/18 0900)   famotidine (PEPCID) IV Stopped (10/04/18 1439)   heparin 900 Units/hr (10/05/18 0900)   norepinephrine (LEVOPHED) Adult infusion 8 mcg/min (10/05/18 0700)   prismasol BGK 4/2.5 1,000 mL/hr at 10/05/18 0800    PRN Medications: Place/Maintain arterial line **AND** sodium chloride, sodium chloride, fentaNYL (SUBLIMAZE) injection, heparin, ondansetron, risperiDONE, sodium chloride flush   Assessment/Plan:   1. PEA arrest on 5/30 - due to hypoxia/pulmonary edema. - now stable  2. Acute on chronic hypoxic respiratory failure - due to OHS/OSA  - intubated on 5/30 in setting of arrest.  - extubated 6/3.  - now with MSSA PNA  On  cefazolin day 6/7 - Respiratory status improved - sleep study 4/15 without OSA (AHI 1.4) but had nocturnal desat - likely need to repeat at some point   3.  Acute on chronic diastolic HF - Admitted with markedly elevated filling pressures complicated by severe cardiorenal syndrome - Now down 65 pounds - Now improving with CVVHD with CVP down to 9. Still requring low-does NE in addition to midodrine - Volume status appears to be at baseline. .   - Anuric. Suspect she may progress to long-term HD - Discussed with Nephrology. Will stop CVVHD after this filter clots and see if we can get her off NE and switch to iHD in the next few days - ? Need to w/u for possible TTR amyloid  4. Pulmonary HTN, severe  -  this is mostly pulmonary venous HTN by cath [facility]) but also has a component of OHS/OSA (WHO group 3) - no role for selective pulmonary vasodilators - will improve significantly with volume removal - consider repeat RHC after volume removal   5. AKI on CKD 3 - baseline creatinine 1.9 -> ESRD - likely cardiorenal - We have suspected that she may progress to long-term HD. Remains anuric - Plan as above., If can switch to [facility] tomorrow can go to the floor on [facility] service,  6. Morbid obesity - desperately needs weight loss  6. Chronic AF/AFL - Off apixaban. Now on heparin with CVVHD.Marland Kitchen No bleeding.Discussed dosing with PharmD personally. - Rhythm looks to be slow AFL currently - no change  (has had some junctional and sinus brady)  7. VT/NSVT - off amio due to bradycardia - Keep K> 4.0 Mg 2.0  8. MSSA PNA - Bcx drawn 6/2. Negative - sputum cx with MSSA PNA - on cefazolin day 6/7  9. Deconditioning - needs PT  Will continue to follow while in ICU but when transfers out of unit should go to [facility].   CRITICAL CARE Performed by: Glori Bickers  Total critical care time: 35 minutes  Critical care time was exclusive of separately billable procedures and treating other  patients.  Critical care was necessary to treat or prevent imminent or life-threatening deterioration.  Critical care was time spent personally by me (independent of midlevel providers or residents) on the following activities: development of treatment plan with patient and/or surrogate as well as nursing, discussions with consultants, evaluation of patient's response to treatment, examination of patient, obtaining history from patient or surrogate, ordering and performing treatments and interventions, ordering and review of laboratory studies, ordering and review of radiographic studies, pulse oximetry and re-evaluation of patient's condition.    Length of Stay: 13  Glori Bickers MD 10/05/2018, 9:17 AM  Advanced Heart Failure Team Pager [phone] (M-F; 7a - 4p)  Please contact [facility] for night-coverage after hours (4p -7a ) and weekends on [email or web address]

## 2018-10-05 NOTE — Progress Notes (Signed)
[place] for Heparin Indication: atrial fibrillation  Allergies  Allergen Reactions  . Doxycycline     REACTION: Wheals and pruritus    Patient Measurements: Height: 5' (152.4 cm) Weight: 235 lb 0.2 oz (106.6 kg) IBW/kg (Calculated) : 45.5  Vital Signs: Temp: 97.8 F (36.6 C) (06/10 0000) Temp Source: Oral (06/10 0000) BP: 107/78 (06/10 0700) Pulse Rate: 48 (06/10 0700)  Labs: Recent Labs    10/03/18 0409 10/03/18 1212  10/04/18 0410 10/04/18 1634 10/05/18 0314  HGB 10.3*  --   --  10.0*  --  9.9*  HCT 35.4*  --   --  34.3*  --  34.3*  PLT 73*  --   --  85*  --  76*  APTT 86*  --   --   --   --   --   LABPROT  --  14.5  --   --   --   --   INR  --  1.1  --   --   --   --   HEPARINUNFRC 0.55  --   --  0.44  --  0.28*  CREATININE 2.10*  --    < > 2.05* 1.84* 1.78*   < > = values in this interval not displayed.    Estimated Creatinine Clearance: 34.3 mL/min (A) (by C-G formula based on SCr of 1.78 mg/dL (H)).  Assessment: Pt is a 67yoF with a history of afib. She was on Eliquis PTA, received in hospital, last dose 5/29. Apixaban on hold for potential need for [facility], currently on CRRT. Pharmacy consulted to dose heparin  Heparin level subtherapeutic at 0.28 on heparin 800 units/hr. Hgb remains low but stable. No signs/symptoms of bleeding or issues with infusion.    Goal of Therapy:  Heparin level 0.3-0.7 units/ml  Monitor platelets by anticoagulation protocol: Yes  Plan:  -Increase heparin to 900 units/hr -Heparin level and CBC daily -Follow plans to transition to [place], PharmD PGY2 Cardiology Pharmacy Resident Please check AMION for all Pharmacist numbers by unit 10/05/2018 7:26 AM

## 2018-10-05 NOTE — Progress Notes (Signed)
Inpatient Diabetes Program Recommendations  AACE/ADA: New Consensus Statement on Inpatient Glycemic Control (2015)  Target Ranges:  Prepandial:   less than 140 mg/dL      Peak postprandial:   less than 180 mg/dL (1-2 hours)      Critically ill patients:  140 - 180 mg/dL   Lab Results  Component Value Date   GLUCAP 261 (H) 10/05/2018   HGBA1C 7.4 (A) 09/13/2018    Review of Glycemic Control Results for SAIGE, CANTON (MRN [id number]) as of 10/05/2018 10:25  Ref. Range 10/04/2018 15:35 10/04/2018 19:50 10/04/2018 23:38 10/05/2018 03:15 10/05/2018 07:45  Glucose-Capillary Latest Ref Range: 70 - 99 mg/dL 233 (H) 235 (H) 296 (H) 259 (H) 261 (H)   Inpatient Diabetes Program Recommendations:    Please add Novolog tube feed coverage 3 units q 4 hours.   Thanks,  Adah Perl, RN, BC-ADM Inpatient Diabetes Coordinator Pager [phone] (8a-5p)

## 2018-10-05 NOTE — Progress Notes (Signed)
[place] for Heparin Indication: atrial fibrillation  Allergies  Allergen Reactions  . Doxycycline     REACTION: Wheals and pruritus    Patient Measurements: Height: 5' (152.4 cm) Weight: 235 lb 0.2 oz (106.6 kg) IBW/kg (Calculated) : 45.5  Vital Signs: Temp: 97.6 F (36.4 C) (06/10 0800) Temp Source: Oral (06/10 0800) BP: 97/35 (06/10 1000) Pulse Rate: 51 (06/10 1000)  Labs: Recent Labs    10/03/18 0409 10/03/18 1212  10/04/18 0410 10/04/18 1634 10/05/18 0314  HGB 10.3*  --   --  10.0*  --  9.9*  HCT 35.4*  --   --  34.3*  --  34.3*  PLT 73*  --   --  85*  --  76*  APTT 86*  --   --   --   --   --   LABPROT  --  14.5  --   --   --   --   INR  --  1.1  --   --   --   --   HEPARINUNFRC 0.55  --   --  0.44  --  0.28*  CREATININE 2.10*  --    < > 2.05* 1.84* 1.78*   < > = values in this interval not displayed.    Estimated Creatinine Clearance: 34.3 mL/min (A) (by C-G formula based on SCr of 1.78 mg/dL (H)).  Assessment: Pt is a 67yoF with a history of afib. She was on Eliquis PTA, received in hospital, last dose 5/29. Apixaban on hold for potential need for [facility], currently on CRRT. Pharmacy consulted to dose heparin  Heparin level subtherapeutic at 0.28 on heparin 800 units/hr. Hgb remains low but stable. No signs/symptoms of bleeding or issues with infusion.    Goal of Therapy:  Heparin level 0.3-0.7 units/ml  Monitor platelets by anticoagulation protocol: Yes  Plan:  -Increase heparin to 900 units/hr -Heparin level and CBC daily -Follow plans to transition to [place], PharmD PGY2 Cardiology Pharmacy Resident Please check AMION for all Pharmacist numbers by unit 10/05/2018 10:56 AM

## 2018-10-05 NOTE — Progress Notes (Signed)
Nutrition Follow-up  DOCUMENTATION CODES:   Morbid obesity  INTERVENTION:   -Recommend repletion of phosphorus per MD -Continue B-complex with Vit C -Monitor for diet advancement per SLP  Continue Tube Feeding:  -Vital 1.5 @ 41 ml (984 ml) -30 ml Prostat TID  Provides: 1776 kcal, 111 grams protein, 752 ml free water. Meets 100% of needs.   NUTRITION DIAGNOSIS:   Inadequate oral intake related to inability to eat as evidenced by NPO status.  Ongoing  GOAL:   Provide needs based on ASPEN/SCCM guidelines  Addressed via TF  MONITOR:   Vent status, Labs, Weight trends, I & O's, TF tolerance  REASON FOR ASSESSMENT:   Consult Enteral/tube feeding initiation and management  ASSESSMENT:   68 year old with morbid obesity, acute on chronic diastolic heart failure, pulmonary hypertension transferred to the ICU after PEA arrest.  Suspect respiratory failure due to pulmonary edema   5/30 - s/p PEA arrest, intubated and transferred to ICU,CRRTinitiated 6/2- failed SBT, pulm edema 6/3 extubated   CRRT running even. Pt down 23 L. Still no UOP. Per nephrology plan to stop CRRT tomorrow and wean off pressors. Possible iHD Fri and Sat. SLP to see pt once CRRT is stopped.   Pt tolerating Vital 1.5 @ 41 ml/hr + 30 Prostat TID. CBGs look to be consistently elevated, Novolog adjusted this am. Continue with current regimen.   Admission weight: 134.8 kg Current weight: 106.6 kg   I/O: -22,720 ml since admit  UOP: none recorded  CRRT: 1566 ml x 24 hrs   Drips: levophed  Medications: SS novolog, midodrine Labs: Phosphorus 1.7 (L) CBG 161-280  Diet Order:   Diet Order            Diet NPO time specified Except for: Ice Chips  Diet effective now              EDUCATION NEEDS:   No education needs have been identified at this time  Skin:  Skin Assessment: Reviewed RN Assessment(MASD to breast, abdomen) Skin Integrity Issues:: Incisions, Other (Comment) Incisions: righ  neck/chest Other: blisters to hip  Last BM:  6/9  Height:   Ht Readings from Last 1 Encounters:  10/05/18 5' (1.524 m)    Weight:   Wt Readings from Last 1 Encounters:  10/05/18 106.6 kg    Ideal Body Weight:  45.5 kg  BMI:  Body mass index is 45.9 kg/m.  Estimated Nutritional Needs:   Kcal:  1600-1800 kcal  Protein:  100-120 grams  Fluid:  >/= 1.6 L/day    Mariana Single RD, LDN Clinical Nutrition Pager # - [phone]

## 2018-10-05 NOTE — Progress Notes (Signed)
Physical Therapy Treatment Patient Details Name: Kathleen [name] MRN: [id number] DOB: 05/08/1950 Today's Date: 10/05/2018    History of Present Illness Kathleen [name] is a 68 y.o. female with history of permanent AF, hypertension, super morbid obesity, DM2, dyslipidemia, obesity hypoventilation syndrome (on home O2 with no OSA by prior PSG but showed nocturnal hypoxemia), CKD stage IV (baseline creatinine about 1.9), chronic diastolic CHF.  Admit for fluid overload. Also with pulmonary HTN.  PEA arrest 5/30, transfer to the ICU, extubater 6/3.    PT Comments    Pt supine on arrival as nurse was bathing pt.  Pt sat edge of bed and performed x 3 sit to stand trials.  She required max +2 to come to edge of bed and total +2 to return to bed.  To achieve standing she required moderate assistance +2.  Pt fatigues quickly and wanted to sit in recliner returned to bed per nursing request as she was on CVVHD ( CRRT ).  Plan for CIR remains appropriate as she has less lines and leads she should be able to mobilize more.  Session limited due to CVVHD ( CRRT).      Follow Up Recommendations  CIR     Equipment Recommendations  None recommended by PT;Other (comment)(TBA)    Recommendations for Other Services Rehab consult     Precautions / Restrictions Precautions Precautions: Fall Precaution Comments: watch O2 sats.  CVVHD ( CRRT) Restrictions Weight Bearing Restrictions: No    Mobility  Bed Mobility Overal bed mobility: Needs Assistance Bed Mobility: Supine to Sit;Sit to Supine     Supine to sit: Max assist;+2 for physical assistance Sit to supine: Total assist;+2 for physical assistance   General bed mobility comments: Pt required assistance to advance LEs to edge of bed and elevate trunk into sitting.  Pt to return to bed required total assistance.    Transfers Overall transfer level: Needs assistance   Transfers: Sit to/from Stand Sit to Stand: Mod assist;+2 physical assistance          General transfer comment: Pt assisted with sit to stand but required mod +2 to achieve standing.  Pt had difficulty with her feet reaching the floor.  Pt is slow and guarded stood x 3 trials. Brief trials of 15-20 secs.      Ambulation/Gait                 Stairs             Wheelchair Mobility    Modified Rankin (Stroke Patients Only)       Balance Overall balance assessment: Needs assistance Sitting-balance support: Single extremity supported;No upper extremity supported Sitting balance-Leahy Scale: Poor Sitting balance - Comments: Had to hold therapists hand to remains sitting as her feet could not reach to floor.       Standing balance-Leahy Scale: Poor Standing balance comment: B UE support in standing.                              Cognition Arousal/Alertness: Awake/alert Behavior During Therapy: WFL for tasks assessed/performed Overall Cognitive Status: Within Functional Limits for tasks assessed                                        Exercises      General Comments  Pertinent Vitals/Pain Pain Assessment: Faces Faces Pain Scale: No hurt    Home Living                      Prior Function            PT Goals (current goals can now be found in the care plan section) Acute Rehab PT Goals Patient Stated Goal: to go home Potential to Achieve Goals: Good Progress towards PT goals: Progressing toward goals    Frequency    Min 3X/week      PT Plan Current plan remains appropriate    Co-evaluation PT/OT/SLP Co-Evaluation/Treatment: Yes Reason for Co-Treatment: Complexity of the patient's impairments (multi-system involvement) PT goals addressed during session: Mobility/safety with mobility OT goals addressed during session: ADL's and self-care      AM-PAC PT "6 Clicks" Mobility   Outcome Measure  Help needed turning from your back to your side while in a flat bed without using  bedrails?: A Lot Help needed moving from lying on your back to sitting on the side of a flat bed without using bedrails?: A Lot Help needed moving to and from a bed to a chair (including a wheelchair)?: Total Help needed standing up from a chair using your arms (e.g., wheelchair or bedside chair)?: A Lot Help needed to walk in hospital room?: Total Help needed climbing 3-5 steps with a railing? : Total 6 Click Score: 9    End of Session Equipment Utilized During Treatment: Oxygen Activity Tolerance: Patient limited by fatigue;Patient tolerated treatment well Patient left: in bed;with call bell/phone within reach;with bed alarm set Nurse Communication: Mobility status;Need for lift equipment PT Visit Diagnosis: Muscle weakness (generalized) (M62.81);Other abnormalities of gait and mobility (R26.89)     Time: 5409-8119 PT Time Calculation (min) (ACUTE ONLY): 35 min  Charges:  $Therapeutic Activity: 8-22 mins                     Governor Rooks, PTA Acute Rehabilitation Services Pager [phone] Office [phone]     Kathleen [name] 10/05/2018, 3:35 PM

## 2018-10-05 NOTE — Evaluation (Signed)
Occupational Therapy Evaluation Patient Details Name: Kathleen [name] MRN: [id number] DOB: 1951-03-01 Today's Date: 10/05/2018    History of Present Illness Kathleen [name] is a 68 y.o. female with history of permanent AF, hypertension, super morbid obesity, DM2, dyslipidemia, obesity hypoventilation syndrome (on home O2 with no OSA by prior PSG but showed nocturnal hypoxemia), CKD stage IV (baseline creatinine about 1.9), chronic diastolic CHF.  Admit for fluid overload. Also with pulmonary HTN.  PEA arrest 5/30, transfer to the ICU, extubater 6/3.   Clinical Impression   PTA patient independent.  Admitted for above and limited by problem list below, including generalized weakness, impaired balance, decreased activity toelrance, body habitus.  Patient currently requires +2 assist for bed mobility and transfers using stedy (mod assist), min assist for grooming, min-mod assist for UB ADLs and max-total assist for LB ADLs.  Cognitively appears to have increased difficulty problem solving, sequencing and attending to tasks when fatigued, but fluctuates during session and will benefit from continued assessment.  Patient will benefit from continued OT services while admitted and after dc at CIR level in order to maximize independence and safety with ADLs/mobility.  Will follow.     Follow Up Recommendations  CIR;Supervision/Assistance - 24 hour    Equipment Recommendations  3 in 1 bedside commode    Recommendations for Other Services Rehab consult     Precautions / Restrictions Precautions Precautions: Fall Precaution Comments: watch O2 sats.  CVVHD ( CRRT) Restrictions Weight Bearing Restrictions: No      Mobility Bed Mobility Overal bed mobility: Needs Assistance Bed Mobility: Supine to Sit;Sit to Supine     Supine to sit: Max assist;+2 for physical assistance Sit to supine: Total assist;+2 for physical assistance   General bed mobility comments: Pt required assistance to advance LEs  to edge of bed and elevate trunk into sitting.  Pt to return to bed required total assistance.    Transfers Overall transfer level: Needs assistance   Transfers: Sit to/from Stand Sit to Stand: Mod assist;+2 physical assistance         General transfer comment: Pt assisted with sit to stand but required mod +2 to achieve standing.  Pt had difficulty with her feet reaching the floor.  Pt is slow and guarded stood x 3 trials. Brief trials of 15-20 secs.        Balance Overall balance assessment: Needs assistance Sitting-balance support: No upper extremity supported;Bilateral upper extremity supported Sitting balance-Leahy Scale: Poor Sitting balance - Comments: relaint on BUE support Had to hold therapists hand to remains sitting as her feet could not reach to floor.     Standing balance support: Bilateral upper extremity supported;During functional activity Standing balance-Leahy Scale: Poor Standing balance comment: B UE support in standing.                             ADL either performed or assessed with clinical judgement   ADL Overall ADL's : Needs assistance/impaired Eating/Feeding: NPO   Grooming: Minimal assistance;Bed level   Upper Body Bathing: Moderate assistance;Sitting   Lower Body Bathing: +2 for physical assistance;Maximal assistance;Sit to/from stand   Upper Body Dressing : Sitting;Moderate assistance   Lower Body Dressing: Total assistance;+2 for physical assistance;+2 for safety/equipment;Sit to/from Health and safety inspector Details (indicate cue type and reason): deferred due to CRRT         Functional mobility during ADLs: Moderate assistance;+2 for physical assistance;+2 for  safety/equipment(using stedy) General ADL Comments: pt limited by generalized weakness, decreased activity tolerance, body habitus and cognition     Vision   Vision Assessment?: No apparent visual deficits     Perception     Praxis      Pertinent  Vitals/Pain Pain Assessment: Faces Faces Pain Scale: No hurt     Hand Dominance Right   Extremity/Trunk Assessment Upper Extremity Assessment Upper Extremity Assessment: Generalized weakness   Lower Extremity Assessment Lower Extremity Assessment: Defer to PT evaluation       Communication Communication Communication: No difficulties   Cognition Arousal/Alertness: Awake/alert Behavior During Therapy: WFL for tasks assessed/performed Overall Cognitive Status: No family/caregiver present to determine baseline cognitive functioning                                 General Comments: pt intermittently would have increased difficulty following commands and require multimodal cueing (increased with fatigue), will continue to assess   General Comments  RN present throughout session, on CRRT    Exercises     Shoulder Instructions      Home Living Family/patient expects to be discharged to:: Private residence Living Arrangements: Alone Available Help at Discharge: Family;Available PRN/intermittently(sister) Type of Home: Mobile home Home Access: Stairs to enter Entrance Stairs-Number of Steps: long ramp type entry with 15 steps with walking space in between them   Home Layout: One level     Bathroom Shower/Tub: Occupational psychologist: Handicapped height     Home Equipment: Cane - single point;Grab bars - tub/shower;Walker - 4 wheels          Prior Functioning/Environment Level of Independence: Independent with assistive device(s);Needs assistance  Gait / Transfers Assistance Needed: used cane PTA.  Niece recently bought pt a rollator ADL's / Homemaking Assistance Needed: Did own ADLS, sister assisted with grocery shopping            OT Problem List: Decreased strength;Decreased activity tolerance;Impaired balance (sitting and/or standing);Decreased coordination;Decreased cognition;Decreased safety awareness;Decreased knowledge of use of DME  or AE;Decreased knowledge of precautions;Obesity      OT Treatment/Interventions: Self-care/ADL training;Therapeutic exercise;DME and/or AE instruction;Therapeutic activities;Patient/family education;Balance training;Cognitive remediation/compensation    OT Goals(Current goals can be found in the care plan section) Acute Rehab OT Goals Patient Stated Goal: to go home OT Goal Formulation: With patient Time For Goal Achievement: 10/19/18 Potential to Achieve Goals: Good  OT Frequency: Min 2X/week   Barriers to D/C:            Co-evaluation PT/OT/SLP Co-Evaluation/Treatment: Yes Reason for Co-Treatment: Complexity of the patient's impairments (multi-system involvement) PT goals addressed during session: Mobility/safety with mobility OT goals addressed during session: ADL's and self-care      AM-PAC OT "6 Clicks" Daily Activity     Outcome Measure Help from another person eating meals?: Total Help from another person taking care of personal grooming?: A Little Help from another person toileting, which includes using toliet, bedpan, or urinal?: Total Help from another person bathing (including washing, rinsing, drying)?: A Lot Help from another person to put on and taking off regular upper body clothing?: A Lot Help from another person to put on and taking off regular lower body clothing?: Total 6 Click Score: 10   End of Session Nurse Communication: Mobility status  Activity Tolerance: Patient tolerated treatment well Patient left: in bed;with call bell/phone within reach;with nursing/sitter in room  OT Visit Diagnosis: Other  abnormalities of gait and mobility (R26.89);Muscle weakness (generalized) (M62.81)                Time: 8737-3081 OT Time Calculation (min): 35 min Charges:  OT General Charges $OT Visit: 1 Visit OT Evaluation $OT Eval Moderate Complexity: [place], OT Acute Rehabilitation Services Pager [phone] Office [phone]     [name] 10/05/2018, 5:09 PM

## 2018-10-05 NOTE — Progress Notes (Signed)
[facility] Progress Note    Assessment/ Plan:   68 y.o.yo femaleHTN, Afib, DM, morbid obesity, diastolic heart failure and also baseline CKD- crt mid to high 1's who was admitted on 5/28/2020with volume overload, then suffered cardiac arrest   1. Renal- A on CRF after cardiac arrest on 5/30. Baseline crt in the mid to high 1's. Required initiation of CRRT early on 5/30- overall negative fluid balance on CRRT- 23 liters. now running even. No uop so will cont CRRT for now while still on pressors. Since had incont episode triedto collect with purewick, not successful- would be hopeful for eventual recovery but no signs yet  Seen on CRRT RIJ TC 500/300/1000 4K, keeping net even at this point On heparin gtt  Appreciate conversion to TC by VIR on 6/8 (on heparin gtt). Will plan on stopping CRRT by tomorrow AM and see if we can get her off pressors + challenge with iHD on Fri and Sat.  2. HTN/volume- ToleratedUF so far- CVP much better - still with relativelyhigh o2 - running even  3. Anemia- hgb close to 10 - no meds yet 4. Elytes- K OK for now - phos OK  Subjective:   On Levo 8  No f/c overnight. +cough (suctioning)   Objective:   BP 107/78   Pulse (!) 48   Temp 97.8 F (36.6 C) (Oral)   Resp 19   Ht 5' (1.524 m)   Wt 106.6 kg   SpO2 92%   BMI 45.90 kg/m   Intake/Output Summary (Last 24 hours) at 10/05/2018 0945 Last data filed at 10/05/2018 0900 Gross per 24 hour  Intake 1691.89 ml  Output 1652 ml  Net 39.89 ml   Weight change: -1.8 kg  Physical Exam: General: obese, alert but confused Heart: brady  Lungs: CBS bilat Abdomen: obese, non tender Extremities:edema better but still present Dialysis Access: right IJ vascath placed 5/30-> converted to RIJ TC 6/8   Imaging: Ir Fluoro Guide Cv Line Right  Result Date: 10/03/2018 INDICATION: ACUTE RENAL FAILURE EXAM: ULTRASOUND GUIDANCE FOR VASCULAR ACCESS RIGHT INTERNAL  JUGULAR PERMANENT HEMODIALYSIS CATHETER Date:  10/03/2018 10/03/2018 3:54 pm Radiologist:  Jerilynn Mages. Daryll Brod, MD Guidance:  Ultrasound fluoroscopic FLUOROSCOPY TIME:  Fluoroscopy Time: 0 minutes 30 seconds (3 mGy). MEDICATIONS: Ancef 2 g administered within 1 the procedure ANESTHESIA/SEDATION: Versed 0.5 mg IV; Fentanyl 25 mcg IV; Moderate Sedation Time:  12 minutes The patient was continuously monitored during the procedure by the interventional radiology nurse under my direct supervision. CONTRAST:  None COMPLICATIONS: None immediate. PROCEDURE: Informed consent was obtained from the patient following explanation of the procedure, risks, benefits and alternatives. The patient understands, agrees and consents for the procedure. All questions were addressed. A time out was performed. Maximal barrier sterile technique utilized including caps, mask, sterile gowns, sterile gloves, large sterile drape, hand hygiene, and 2% chlorhexidine scrub. Under sterile conditions and local anesthesia, right internal jugular micropuncture venous access was performed with ultrasound. Images were obtained for documentation of the patent right internal jugular vein. A guide wire was inserted followed by a transitional dilator. Next, a 0.035 guidewire was advanced into the IVC with a 5-French catheter. Measurements were obtained from the right venotomy site to the proximal right atrium. In the right infraclavicular chest, a subcutaneous tunnel was created under sterile conditions and local anesthesia. 1% lidocaine with epinephrine was utilized for this. The 23 cm tip to cuff palindrome catheter was tunneled subcutaneously to the venotomy site and inserted into the  SVC/RA junction through a valved peel-away sheath. Position was confirmed with fluoroscopy. Images were obtained for documentation. Blood was aspirated from the catheter followed by saline and heparin flushes. The appropriate volume and strength of heparin was instilled in each  lumen. Caps were applied. The catheter was secured at the tunnel site with Gelfoam and a pursestring suture. The venotomy site was closed with subcuticular Vicryl suture. Dermabond was applied to the small right neck incision. A dry sterile dressing was applied. The catheter is ready for use. No immediate complications. IMPRESSION: Ultrasound and fluoroscopically guided right internal jugular tunneled hemodialysis catheter (23 cm tip to cuff palindrome catheter). Electronically Signed   By: Jerilynn Mages.  Shick M.D.   On: 10/03/2018 15:59   Ir US Guide Vasc Access Right  Result Date: 10/03/2018 INDICATION: ACUTE RENAL FAILURE EXAM: ULTRASOUND GUIDANCE FOR VASCULAR ACCESS RIGHT INTERNAL JUGULAR PERMANENT HEMODIALYSIS CATHETER Date:  10/03/2018 10/03/2018 3:54 pm Radiologist:  Jerilynn Mages. Daryll Brod, MD Guidance:  Ultrasound fluoroscopic FLUOROSCOPY TIME:  Fluoroscopy Time: 0 minutes 30 seconds (3 mGy). MEDICATIONS: Ancef 2 g administered within 1 the procedure ANESTHESIA/SEDATION: Versed 0.5 mg IV; Fentanyl 25 mcg IV; Moderate Sedation Time:  12 minutes The patient was continuously monitored during the procedure by the interventional radiology nurse under my direct supervision. CONTRAST:  None COMPLICATIONS: None immediate. PROCEDURE: Informed consent was obtained from the patient following explanation of the procedure, risks, benefits and alternatives. The patient understands, agrees and consents for the procedure. All questions were addressed. A time out was performed. Maximal barrier sterile technique utilized including caps, mask, sterile gowns, sterile gloves, large sterile drape, hand hygiene, and 2% chlorhexidine scrub. Under sterile conditions and local anesthesia, right internal jugular micropuncture venous access was performed with ultrasound. Images were obtained for documentation of the patent right internal jugular vein. A guide wire was inserted followed by a transitional dilator. Next, a 0.035 guidewire was advanced into  the IVC with a 5-French catheter. Measurements were obtained from the right venotomy site to the proximal right atrium. In the right infraclavicular chest, a subcutaneous tunnel was created under sterile conditions and local anesthesia. 1% lidocaine with epinephrine was utilized for this. The 23 cm tip to cuff palindrome catheter was tunneled subcutaneously to the venotomy site and inserted into the SVC/RA junction through a valved peel-away sheath. Position was confirmed with fluoroscopy. Images were obtained for documentation. Blood was aspirated from the catheter followed by saline and heparin flushes. The appropriate volume and strength of heparin was instilled in each lumen. Caps were applied. The catheter was secured at the tunnel site with Gelfoam and a pursestring suture. The venotomy site was closed with subcuticular Vicryl suture. Dermabond was applied to the small right neck incision. A dry sterile dressing was applied. The catheter is ready for use. No immediate complications. IMPRESSION: Ultrasound and fluoroscopically guided right internal jugular tunneled hemodialysis catheter (23 cm tip to cuff palindrome catheter). Electronically Signed   By: Jerilynn Mages.  Shick M.D.   On: 10/03/2018 15:59    Labs: BMET Recent Labs  Lab 10/02/18 0411 10/02/18 1624 10/03/18 0409 10/03/18 1704 10/04/18 0410 10/04/18 1634 10/05/18 0314  NA 139 137 135 137 134* 135 135  K 4.4 4.7 4.7 4.5 4.5 4.4 4.3  CL 102 100 102 103 99 103 102  CO2 27 26 27 24 24 24 25   GLUCOSE 215* 194* 206* 135* 279* 261* 296*  BUN 40* 40* 39* 38* 40* 33* 33*  CREATININE 2.12* 2.16* 2.10* 2.22* 2.05* 1.84* 1.78*  CALCIUM 8.3* 8.2* 8.0* 7.7* 8.1* 7.7* 7.7*  PHOS 2.9 2.4* 1.8* 2.0* 2.0* 1.6* 1.7*   CBC Recent Labs  Lab 10/02/18 0411 10/03/18 0409 10/04/18 0410 10/05/18 0314  WBC 5.0 4.6 5.4 6.8  NEUTROABS 2.6  --   --   --   HGB 10.8* 10.3* 10.0* 9.9*  HCT 37.4 35.4* 34.3* 34.3*  MCV 84.8 85.5 86.4 86.8  PLT 78* 73* 85* 76*     Medications:    . atorvastatin  40 mg Oral q1800  . chlorhexidine  15 mL Mouth Rinse BID  . Chlorhexidine Gluconate Cloth  6 each Topical Daily  . feeding supplement (PRO-STAT SUGAR FREE 64)  30 mL Per Tube TID  . feeding supplement (VITAL 1.5 CAL)  1,000 mL Per Tube Q24H  . gabapentin  100 mg Per Tube Q12H  . insulin aspart  0-20 Units Subcutaneous Q4H  . mouth rinse  15 mL Mouth Rinse q12n4p  . midodrine  10 mg Oral TID WC      Otelia Santee, MD 10/05/2018, 9:45 AM

## 2018-10-05 NOTE — Progress Notes (Signed)
  Speech Language Pathology Treatment: Dysphagia  Patient Details Name: Kathleen [name] MRN: [id number] DOB: 09/21/1950 Today's Date: 10/05/2018 Time: 5956-3875 SLP Time Calculation (min) (ACUTE ONLY): 17 min  Assessment / Plan / Recommendation Clinical Impression  Treatment focused primarily on secretion management with therapeutic trials of ice chips provided by SLP. Min cues were provided for hard swallows and Min-Mod cues for hard cough and use of yankauer to remove secretions. Her voice is dysphonic and with short duration of phonation, but she has subjectively improved volume after even a few ice chips are provided. Per RN, plan is to pause CRRT on next date. Will f/u for likely swallow study on next date - will plan for FEES vs MBS as scheduling allows.   HPI HPI: 68 y.o. female admitted 09/22/2018 for surgery. PMH: permanent atrial flutter with baseline bradycardia, hypertension, super morbid obesity, IIDDM, dyslipidemia, obesity hypoventilation syndrome, CKD stage IV, chronic diastolic CHF, chronic edema. Intubated 5/30-09/28/2018. PEA 09/24/2018. CXR = increase edema pattern with some confluence of infiltrate in the right lower lobe      SLP Plan  MBS;Other (Comment)(vs FEES)       Recommendations  Diet recommendations: NPO;Other(comment)(few ice chips after oral care) Medication Administration: Via alternative means                Oral Care Recommendations: Oral care QID;Oral care prior to ice chip/H20 Follow up Recommendations: (tba) SLP Visit Diagnosis: Dysphagia, unspecified (R13.10) Plan: MBS;Other (Comment)(vs FEES)       GO                [name] Toini Failla 10/05/2018, 10:06 AM  Pollyann Glen, M.A. [facility] Acute Environmental education officer [phone] Office [phone]

## 2018-10-06 ENCOUNTER — Inpatient Hospital Stay (HOSPITAL_COMMUNITY): Payer: Medicare HMO

## 2018-10-06 DIAGNOSIS — G9340 Encephalopathy, unspecified: Secondary | ICD-10-CM

## 2018-10-06 LAB — POCT I-STAT 7, (LYTES, BLD GAS, ICA,H+H)
Acid-base deficit: 2 mmol/L (ref 0.0–2.0)
Acid-base deficit: 2 mmol/L (ref 0.0–2.0)
Acid-base deficit: 4 mmol/L — ABNORMAL HIGH (ref 0.0–2.0)
Bicarbonate: 24.5 mmol/L (ref 20.0–28.0)
Bicarbonate: 24.7 mmol/L (ref 20.0–28.0)
Bicarbonate: 24.9 mmol/L (ref 20.0–28.0)
Bicarbonate: 22.8 mmol/L (ref 20.0–28.0)
Calcium, Ion: 1.18 mmol/L (ref 1.15–1.40)
Calcium, Ion: 1.19 mmol/L (ref 1.15–1.40)
Calcium, Ion: 1.19 mmol/L (ref 1.15–1.40)
Calcium, Ion: 1.17 mmol/L (ref 1.15–1.40)
HCT: 33 % — ABNORMAL LOW (ref 36.0–46.0)
HCT: 34 % — ABNORMAL LOW (ref 36.0–46.0)
HCT: 34 % — ABNORMAL LOW (ref 36.0–46.0)
HCT: 33 % — ABNORMAL LOW (ref 36.0–46.0)
Hemoglobin: 11.2 g/dL — ABNORMAL LOW (ref 12.0–15.0)
Hemoglobin: 11.6 g/dL — ABNORMAL LOW (ref 12.0–15.0)
Hemoglobin: 11.6 g/dL — ABNORMAL LOW (ref 12.0–15.0)
Hemoglobin: 11.2 g/dL — ABNORMAL LOW (ref 12.0–15.0)
O2 Saturation: 90 %
O2 Saturation: 79 %
O2 Saturation: 97 %
O2 Saturation: 98 %
Patient temperature: 101.8
Patient temperature: 102
Patient temperature: 101.6
Patient temperature: 100.9
Potassium: 5.5 mmol/L — ABNORMAL HIGH (ref 3.5–5.1)
Potassium: 5.5 mmol/L — ABNORMAL HIGH (ref 3.5–5.1)
Potassium: 5.6 mmol/L — ABNORMAL HIGH (ref 3.5–5.1)
Potassium: 5.5 mmol/L — ABNORMAL HIGH (ref 3.5–5.1)
Sodium: 135 mmol/L (ref 135–145)
Sodium: 136 mmol/L (ref 135–145)
Sodium: 136 mmol/L (ref 135–145)
Sodium: 136 mmol/L (ref 135–145)
TCO2: 26 mmol/L (ref 22–32)
TCO2: 26 mmol/L (ref 22–32)
TCO2: 27 mmol/L (ref 22–32)
TCO2: 24 mmol/L (ref 22–32)
pCO2 arterial: 43.3 mmHg (ref 32.0–48.0)
pCO2 arterial: 53 mmHg — ABNORMAL HIGH (ref 32.0–48.0)
pCO2 arterial: 56.5 mmHg — ABNORMAL HIGH (ref 32.0–48.0)
pCO2 arterial: 53 mmHg — ABNORMAL HIGH (ref 32.0–48.0)
pH, Arterial: 7.367 (ref 7.350–7.450)
pH, Arterial: 7.285 — ABNORMAL LOW (ref 7.350–7.450)
pH, Arterial: 7.261 — ABNORMAL LOW (ref 7.350–7.450)
pH, Arterial: 7.249 — ABNORMAL LOW (ref 7.350–7.450)
pO2, Arterial: 67 mmHg — ABNORMAL LOW (ref 83.0–108.0)
pO2, Arterial: 55 mmHg — ABNORMAL LOW (ref 83.0–108.0)
pO2, Arterial: 116 mmHg — ABNORMAL HIGH (ref 83.0–108.0)
pO2, Arterial: 131 mmHg — ABNORMAL HIGH (ref 83.0–108.0)

## 2018-10-06 LAB — RENAL FUNCTION PANEL
Albumin: 1.9 g/dL — ABNORMAL LOW (ref 3.5–5.0)
Albumin: 1.9 g/dL — ABNORMAL LOW (ref 3.5–5.0)
Anion gap: 10 (ref 5–15)
Anion gap: 9 (ref 5–15)
BUN: 47 mg/dL — ABNORMAL HIGH (ref 8–23)
BUN: 46 mg/dL — ABNORMAL HIGH (ref 8–23)
CO2: 23 mmol/L (ref 22–32)
CO2: 23 mmol/L (ref 22–32)
Calcium: 8 mg/dL — ABNORMAL LOW (ref 8.9–10.3)
Calcium: 7.8 mg/dL — ABNORMAL LOW (ref 8.9–10.3)
Chloride: 100 mmol/L (ref 98–111)
Chloride: 102 mmol/L (ref 98–111)
Creatinine, Ser: 2.78 mg/dL — ABNORMAL HIGH (ref 0.44–1.00)
Creatinine, Ser: 2.75 mg/dL — ABNORMAL HIGH (ref 0.44–1.00)
GFR calc Af Amer: 20 mL/min — ABNORMAL LOW (ref >60)
GFR calc Af Amer: 20 mL/min — ABNORMAL LOW (ref >60)
GFR calc non Af Amer: 17 mL/min — ABNORMAL LOW (ref >60)
GFR calc non Af Amer: 17 mL/min — ABNORMAL LOW (ref >60)
Glucose, Bld: 313 mg/dL — ABNORMAL HIGH (ref 70–99)
Glucose, Bld: 306 mg/dL — ABNORMAL HIGH (ref 70–99)
Phosphorus: 1.3 mg/dL — ABNORMAL LOW (ref 2.5–4.6)
Phosphorus: 2.6 mg/dL (ref 2.5–4.6)
Potassium: 5.5 mmol/L — ABNORMAL HIGH (ref 3.5–5.1)
Potassium: 5 mmol/L (ref 3.5–5.1)
Sodium: 133 mmol/L — ABNORMAL LOW (ref 135–145)
Sodium: 134 mmol/L — ABNORMAL LOW (ref 135–145)

## 2018-10-06 LAB — CBC
HCT: 33.9 % — ABNORMAL LOW (ref 36.0–46.0)
Hemoglobin: 9.7 g/dL — ABNORMAL LOW (ref 12.0–15.0)
MCH: 24.9 pg — ABNORMAL LOW (ref 26.0–34.0)
MCHC: 28.6 g/dL — ABNORMAL LOW (ref 30.0–36.0)
MCV: 86.9 fL (ref 80.0–100.0)
Platelets: 134 10*3/uL — ABNORMAL LOW (ref 150–400)
RBC: 3.9 MIL/uL (ref 3.87–5.11)
RDW: 20.4 % — ABNORMAL HIGH (ref 11.5–15.5)
WBC: 9 10*3/uL (ref 4.0–10.5)
nRBC: 1.9 % — ABNORMAL HIGH (ref 0.0–0.2)

## 2018-10-06 LAB — GLUCOSE, CAPILLARY
Glucose-Capillary: 114 mg/dL — ABNORMAL HIGH (ref 70–99)
Glucose-Capillary: 249 mg/dL — ABNORMAL HIGH (ref 70–99)
Glucose-Capillary: 288 mg/dL — ABNORMAL HIGH (ref 70–99)
Glucose-Capillary: 293 mg/dL — ABNORMAL HIGH (ref 70–99)
Glucose-Capillary: 305 mg/dL — ABNORMAL HIGH (ref 70–99)
Glucose-Capillary: 308 mg/dL — ABNORMAL HIGH (ref 70–99)

## 2018-10-06 LAB — HEPARIN LEVEL (UNFRACTIONATED): Heparin Unfractionated: 0.33 IU/mL (ref 0.30–0.70)

## 2018-10-06 MED ORDER — HEPARIN SODIUM (PORCINE) 1000 UNIT/ML DIALYSIS
1000.0000 [IU] | INTRAMUSCULAR | Status: DC | PRN
  Administered 2018-10-20: 4400 [IU] via INTRAVENOUS_CENTRAL
  Filled 2018-10-06: qty 5
  Filled 2018-10-06 (×2): qty 6

## 2018-10-06 MED ORDER — FENTANYL CITRATE (PF) 100 MCG/2ML IJ SOLN: 25.0000 ug | Freq: Once | INTRAMUSCULAR | Status: DC

## 2018-10-06 MED ORDER — MIDAZOLAM HCL 2 MG/2ML IJ SOLN
1.0000 mg | INTRAMUSCULAR | Status: DC | PRN
  Administered 2018-10-07 – 2018-10-25 (×7): 1 mg via INTRAVENOUS
  Filled 2018-10-06 (×7): qty 2

## 2018-10-06 MED ORDER — VANCOMYCIN HCL 10 G IV SOLR
2000.0000 mg | Freq: Once | INTRAVENOUS | Status: AC
  Administered 2018-10-06: 2000 mg via INTRAVENOUS
  Filled 2018-10-06: qty 2000

## 2018-10-06 MED ORDER — ORAL CARE MOUTH RINSE
15.0000 mL | OROMUCOSAL | Status: DC
  Administered 2018-10-06 – 2018-11-05 (×288): 15 mL via OROMUCOSAL

## 2018-10-06 MED ORDER — BISACODYL 10 MG RE SUPP
10.0000 mg | Freq: Every day | RECTAL | Status: DC | PRN
  Administered 2018-10-11: 10 mg via RECTAL
  Filled 2018-10-06: qty 1

## 2018-10-06 MED ORDER — SODIUM CHLORIDE 0.9 % IV SOLN
2.0000 g | Freq: Two times a day (BID) | INTRAVENOUS | Status: DC
  Filled 2018-10-06: qty 2

## 2018-10-06 MED ORDER — SODIUM CHLORIDE 0.9 % IV SOLN
2.0000 g | Freq: Once | INTRAVENOUS | Status: AC
  Administered 2018-10-06: 2 g via INTRAVENOUS
  Filled 2018-10-06: qty 2

## 2018-10-06 MED ORDER — LACTULOSE 10 GM/15ML PO SOLN: 20.0000 g | Freq: Once | ORAL | Status: DC

## 2018-10-06 MED ORDER — SODIUM CHLORIDE 0.9 % IV SOLN
2.0000 g | INTRAVENOUS | Status: DC
  Filled 2018-10-06: qty 2

## 2018-10-06 MED ORDER — DOCUSATE SODIUM 50 MG/5ML PO LIQD
100.0000 mg | Freq: Two times a day (BID) | ORAL | Status: DC | PRN
  Administered 2018-10-08 – 2018-10-11 (×2): 100 mg
  Filled 2018-10-06 (×2): qty 10

## 2018-10-06 MED ORDER — FENTANYL 2500MCG IN NS 250ML (10MCG/ML) PREMIX INFUSION
25.0000 ug/h | INTRAVENOUS | Status: DC
  Administered 2018-10-06: 25 ug/h via INTRAVENOUS
  Administered 2018-10-07: 100 ug/h via INTRAVENOUS
  Administered 2018-10-09 – 2018-10-12 (×2): 25 ug/h via INTRAVENOUS
  Filled 2018-10-06 (×3): qty 250

## 2018-10-06 MED ORDER — SODIUM CHLORIDE 0.9 % IV SOLN
INTRAVENOUS | Status: DC | PRN
  Administered 2018-10-06: 10:00:00 via INTRAVENOUS_CENTRAL

## 2018-10-06 MED ORDER — ACETAMINOPHEN 160 MG/5ML PO SOLN
650.0000 mg | Freq: Four times a day (QID) | ORAL | Status: DC | PRN
  Administered 2018-10-06 – 2018-11-14 (×8): 650 mg
  Filled 2018-10-06 (×9): qty 20.3

## 2018-10-06 MED ORDER — MIDAZOLAM HCL 2 MG/2ML IJ SOLN: 1.0000 mg | INTRAMUSCULAR | Status: DC | PRN

## 2018-10-06 MED ORDER — INSULIN GLARGINE 100 UNIT/ML ~~LOC~~ SOLN
8.0000 [IU] | Freq: Every day | SUBCUTANEOUS | Status: DC
  Administered 2018-10-06: 8 [IU] via SUBCUTANEOUS
  Filled 2018-10-06 (×2): qty 0.08

## 2018-10-06 MED ORDER — SODIUM CHLORIDE 0.9 % IV SOLN
2.0000 g | Freq: Two times a day (BID) | INTRAVENOUS | Status: AC
  Administered 2018-10-07 – 2018-10-13 (×14): 2 g via INTRAVENOUS
  Filled 2018-10-06 (×14): qty 2

## 2018-10-06 MED ORDER — ETOMIDATE 2 MG/ML IV SOLN
20.0000 mg | Freq: Once | INTRAVENOUS | Status: AC
  Administered 2018-10-06: 06:00:00 20 mg via INTRAVENOUS

## 2018-10-06 MED ORDER — PRISMASOL BGK 4/2.5 32-4-2.5 MEQ/L REPLACEMENT SOLN
Status: DC
  Administered 2018-10-06 – 2018-10-07 (×5): via INTRAVENOUS_CENTRAL
  Administered 2018-10-08: 500 mL via INTRAVENOUS_CENTRAL
  Filled 2018-10-06 (×7): qty 5000

## 2018-10-06 MED ORDER — CHLORHEXIDINE GLUCONATE 0.12% ORAL RINSE (MEDLINE KIT)
15.0000 mL | Freq: Two times a day (BID) | OROMUCOSAL | Status: DC
  Administered 2018-10-06 – 2018-11-17 (×81): 15 mL via OROMUCOSAL

## 2018-10-06 MED ORDER — FENTANYL BOLUS VIA INFUSION
25.0000 ug | INTRAVENOUS | Status: DC | PRN
  Administered 2018-10-07: 25 ug via INTRAVENOUS
  Filled 2018-10-06: qty 25

## 2018-10-06 MED ORDER — VANCOMYCIN HCL IN DEXTROSE 1-5 GM/200ML-% IV SOLN
1000.0000 mg | INTRAVENOUS | Status: DC
  Administered 2018-10-07 – 2018-10-09 (×3): 1000 mg via INTRAVENOUS
  Filled 2018-10-06 (×3): qty 200

## 2018-10-06 MED ORDER — SODIUM PHOSPHATES 45 MMOLE/15ML IV SOLN
10.0000 mmol | Freq: Once | INTRAVENOUS | Status: AC
  Administered 2018-10-06: 10 mmol via INTRAVENOUS
  Filled 2018-10-06: qty 3.33

## 2018-10-06 MED ORDER — PRISMASOL BGK 4/2.5 32-4-2.5 MEQ/L IV SOLN
INTRAVENOUS | Status: DC
  Administered 2018-10-06 – 2018-10-07 (×7): via INTRAVENOUS_CENTRAL
  Administered 2018-10-07: 1500 mL/h via INTRAVENOUS_CENTRAL
  Administered 2018-10-07 – 2018-10-08 (×3): via INTRAVENOUS_CENTRAL
  Administered 2018-10-08: 1500 mL/h via INTRAVENOUS_CENTRAL
  Filled 2018-10-06 (×20): qty 5000

## 2018-10-06 MED ORDER — PRISMASOL BGK 4/2.5 32-4-2.5 MEQ/L REPLACEMENT SOLN
Status: DC
  Administered 2018-10-06 – 2018-10-07 (×2): via INTRAVENOUS_CENTRAL
  Administered 2018-10-07: 300 mL via INTRAVENOUS_CENTRAL
  Filled 2018-10-06 (×4): qty 5000
  Filled 2018-10-06 (×3): qty 15000

## 2018-10-06 MED ORDER — INSULIN ASPART 100 UNIT/ML ~~LOC~~ SOLN
4.0000 [IU] | SUBCUTANEOUS | Status: DC
  Administered 2018-10-06 – 2018-10-10 (×24): 4 [IU] via SUBCUTANEOUS

## 2018-10-06 MED ORDER — FENTANYL 2500MCG IN NS 250ML (10MCG/ML) PREMIX INFUSION
0.0000 ug/h | INTRAVENOUS | Status: DC
  Filled 2018-10-06: qty 250

## 2018-10-06 MED ORDER — VANCOMYCIN HCL IN DEXTROSE 1-5 GM/200ML-% IV SOLN: 1000.0000 mg | INTRAVENOUS | Status: DC

## 2018-10-06 MED ORDER — PRO-STAT SUGAR FREE PO LIQD
30.0000 mL | Freq: Four times a day (QID) | ORAL | Status: DC
  Administered 2018-10-06 – 2018-10-18 (×45): 30 mL
  Filled 2018-10-06 (×42): qty 30

## 2018-10-06 MED ORDER — VITAL 1.5 CAL PO LIQD
1000.0000 mL | ORAL | Status: DC
  Filled 2018-10-06: qty 1000

## 2018-10-06 NOTE — Progress Notes (Addendum)
Pharmacy Antibiotic Note  Kathleen [name] is a 68 y.o. female admitted on 09/22/2018, now spiking fevers and concern for sepsis.  Pharmacy has been consulted for vanc and cefepime dosing.  Pt had been on CRRT, stopped yesterday w/ plan to challenge iHD Fri/Sat.  Plan: Will give vancomycin 2g and cefepime 2g IV x1 dose each and f/u progress prior to redosing.  Height: 5' (152.4 cm) Weight: 235 lb 0.2 oz (106.6 kg) IBW/kg (Calculated) : 45.5  Temp (24hrs), Avg:99 F (37.2 C), Min:97.6 F (36.4 C), Max:101.8 F (38.8 C)  Recent Labs  Lab 10/02/18 0411  10/03/18 0409  10/04/18 0410 10/04/18 1634 10/05/18 0314 10/05/18 1552 10/06/18 0300  WBC 5.0  --  4.6  --  5.4  --  6.8  --  9.0  CREATININE 2.12*   < > 2.10*   < > 2.05* 1.84* 1.78* 1.85* 2.78*   < > = values in this interval not displayed.    Estimated Creatinine Clearance: 22 mL/min (A) (by C-G formula based on SCr of 2.78 mg/dL (H)).    Allergies  Allergen Reactions  . Doxycycline     REACTION: Wheals and pruritus    Thank you for allowing pharmacy to be a part of this patient's care.  Wynona Neat, PharmD, BCPS  10/06/2018 3:58 AM   Addendum: Now to resume CRRT.  Will continue w/ vanc 1g IV Q24H and cefepime 2g IV Q12H and f/u plans for dose adjustments.  VB 8:12 AM

## 2018-10-06 NOTE — Progress Notes (Signed)
[facility] Progress Note    Assessment/ Plan:   68 y.o.yo femaleHTN, Afib, DM, morbid obesity, diastolic heart failure and also baseline CKD- crt mid to high 1's who was admitted on 5/28/2020with volume overload, then suffered cardiac arrest   1. Renal- A on CRF after cardiac arrest on 5/30. Baseline crt in the mid to high 1's. Required initiation of CRRT early on 5/30- overall negative fluid balance on CRRT- 21.8liters (w/ . had been running even prior to CRRT being stopped 6/10 (5/30-6/10).   Hopeful for eventual recovery but no signs yet  Appreciateconversion to TC by VIR on 6/8(on heparin gtt).  Will plan on restarting CRRT but will be limited UF given on Levophed.  2. HTN/volume- ToleratedUF so far- CVP 4-5. 3. Anemia- hgb close to 10 - no meds yet 4. PNA? 5. Elytes- K high.    Subjective:   On Levo 8  Intubated overnight for resp distress; frothy sputum  Noted.    Objective:   BP (!) 98/48   Pulse 64   Temp (!) 101.9 F (38.8 C) (Axillary)   Resp (!) 25   Ht 5' (1.524 m)   Wt 108.2 kg   SpO2 96%   BMI 46.59 kg/m   Intake/Output Summary (Last 24 hours) at 10/06/2018 0746 Last data filed at 10/06/2018 0700 Gross per 24 hour  Intake 2711.07 ml  Output 978 ml  Net 1733.07 ml   Weight change: 1.6 kg  Physical Exam: General: Intubated  Heart: brady overnight, (60 @ time of exam) Lungs: CBS bilat Abdomen: obese, non tender Extremities:edema better but still present Dialysis Access: right IJ vascath placed 5/30-> converted to RIJ TC 6/8   Imaging: Dg Chest Port 1 View  Result Date: 10/06/2018 CLINICAL DATA:  Fever and hypoxia. EXAM: PORTABLE CHEST 1 VIEW COMPARISON:  One-view chest x-ray 10/02/2018 FINDINGS: The heart is enlarged. New right IJ catheter is in place. Feeding tube courses off the inferior border the film. Left-sided PICC line terminates near the azygos. There is increase in a diffuse interstitial and  airspace pattern. No significant effusions are present. IMPRESSION: 1. Increasing diffuse interstitial and airspace disease consistent with edema or ARDS. Infection is not excluded. 2. New right IJ catheter without pneumothorax. Electronically Signed   By: San Morelle M.D.   On: 10/06/2018 04:56    Labs: BMET Recent Labs  Lab 10/03/18 0409 10/03/18 1704 10/04/18 0410 10/04/18 1634 10/05/18 0314 10/05/18 1552 10/06/18 0300 10/06/18 0353 10/06/18 0556  NA 135 137 134* 135 135 135 133* 135 136  K 4.7 4.5 4.5 4.4 4.3 4.0 5.5* 5.5* 5.5*  CL 102 103 99 103 102 102 100  --   --   CO2 27 24 24 24 25 23 23   --   --   GLUCOSE 206* 135* 279* 261* 296* 304* 313*  --   --   BUN 39* 38* 40* 33* 33* 29* 47*  --   --   CREATININE 2.10* 2.22* 2.05* 1.84* 1.78* 1.85* 2.78*  --   --   CALCIUM 8.0* 7.7* 8.1* 7.7* 7.7* 7.2* 8.0*  --   --   PHOS 1.8* 2.0* 2.0* 1.6* 1.7* 1.4* 1.3*  --   --    CBC Recent Labs  Lab 10/02/18 0411 10/03/18 0409 10/04/18 0410 10/05/18 0314 10/06/18 0300 10/06/18 0353 10/06/18 0556  WBC 5.0 4.6 5.4 6.8 9.0  --   --   NEUTROABS 2.6  --   --   --   --   --   --  HGB 10.8* 10.3* 10.0* 9.9* 9.7* 11.2* 11.6*  HCT 37.4 35.4* 34.3* 34.3* 33.9* 33.0* 34.0*  MCV 84.8 85.5 86.4 86.8 86.9  --   --   PLT 78* 73* 85* 76* 134*  --   --     Medications:    . atorvastatin  40 mg Oral q1800  . chlorhexidine gluconate (MEDLINE KIT)  15 mL Mouth Rinse BID  . Chlorhexidine Gluconate Cloth  6 each Topical Daily  . feeding supplement (PRO-STAT SUGAR FREE 64)  30 mL Per Tube TID  . feeding supplement (VITAL 1.5 CAL)  1,000 mL Per Tube Q24H  . fentaNYL (SUBLIMAZE) injection  25 mcg Intravenous Once  . gabapentin  100 mg Per Tube Q12H  . insulin aspart  0-20 Units Subcutaneous Q4H  . insulin aspart  3 Units Subcutaneous Q4H  . mouth rinse  15 mL Mouth Rinse 10 times per day  . midodrine  10 mg Oral TID WC      Otelia Santee, MD 10/06/2018, 7:46 AM

## 2018-10-06 NOTE — Progress Notes (Signed)
Notified Brandi, NP of patient having head and bilateral arm twitching. Patient appears to stare off to the right, but is easily redirected when called by name, responds to commands, makes eye contact, and smiles. Pupils are 2-6mm, equal, reactive. NP stated it's likely due to hypercarbia. Will continue to monitor.  Joellen Jersey, RN

## 2018-10-06 NOTE — Progress Notes (Signed)
Nutrition Follow-up  DOCUMENTATION CODES:   Morbid obesity  INTERVENTION:   -Continue B-complex with Vit C -Monitor Phosphorus and CBGs   Change Tube Feeding:  -Vital 1.5 @ 30 ml (720 ml) -60 ml Prostat BID  Provides: 1480 kcal, 109 grams protein, 550 ml free water. Meets 100% of needs.  NUTRITION DIAGNOSIS:   Inadequate oral intake related to inability to eat as evidenced by NPO status.  Ongoing  GOAL:   Provide needs based on ASPEN/SCCM guidelines  Addressed via TF  MONITOR:   Vent status, Labs, Weight trends, I & O's, TF tolerance  REASON FOR ASSESSMENT:   Consult Enteral/tube feeding initiation and management  ASSESSMENT:   68 year old with morbid obesity, acute on chronic diastolic heart failure, pulmonary hypertension transferred to the ICU after PEA arrest.  Suspect respiratory failure due to pulmonary edema   5/30 - s/p PEA arrest, intubated and transferred to ICU,CRRTinitiated 6/2- failed SBT, pulm edema 6/3 extubated, refused BiPAP 6/10- CRRT stopped  6/11- re-intubated, possible PNA, CRRT re-started   Pt refusing BiPAP since extubation. Found with AMS/lethargy early this morning and was re-intubated. Pt with fever. Requiring levophed. Phosphorus remains low, being repleted at this time. Need adjusted for intubation. Will continue to monitor CBGs.   Admission weight: 134.8 kg Current weight: 108.2 kg    Generalized non-pitting edema noted per nursing assessment.   Patient is currently intubated on ventilator support MV: 11.3 L/min Temp (24hrs), Avg:100.6 F (38.1 C), Min:98 F (36.7 C), Max:102 F (38.9 C) BP: 100/43 MAP: 60  I/O: -20,647 ml since admit UOP: 1 occurrence documented x 24 hrs (unmeasured) CRRT: 978 ml x 24 hrs   Drips: fentanyl, levophed Medications: SS novolog, lantus, midodrine Labs: K 5.5 (H) CBG 303-313 Phosphorus 1.3 (L) corrected calcium 9.7 (wdl)   Diet Order:   Diet Order    None      EDUCATION NEEDS:    No education needs have been identified at this time  Skin:  Skin Assessment: Reviewed RN Assessment(MASD to breast, abdomen) Skin Integrity Issues:: Incisions, Other (Comment) Incisions: righ neck/chest Other: blisters to hip  Last BM:  6/9  Height:   Ht Readings from Last 1 Encounters:  10/05/18 5' (1.524 m)    Weight:   Wt Readings from Last 1 Encounters:  10/06/18 108.2 kg    Ideal Body Weight:  45.5 kg  BMI:  Body mass index is 46.59 kg/m.  Estimated Nutritional Needs:   Kcal:  2482-5003  Protein:  100-115 grams  Fluid:  >/= 1.5 L/day   Mariana Single RD, LDN Clinical Nutrition Pager # - [phone]

## 2018-10-06 NOTE — Progress Notes (Signed)
[facility] Progress Note Patient Name: Kathleen [name] DOB: 1950/05/29 MRN: [id number]   Date of Service  10/06/2018  HPI/Events of Note  Fever to 101.8 F, increase O2 requirement, decrease LOC. Blood glucose in 290's. ALT and AST both normal.  eICU Interventions  Will order: 1. Blood cultures X 2. 2. Sputum culture. 3. ABG STAT. 4. Portable CXR STAT. 5. Vancomycin and Cefepime per pharmacy consult. 6.Tylenol liquid 650 mg per tube Q 6 hours PRN Temp > 100.5 F. 7. PCCM ground team notified of re-consult.         Sommer,Steven Cornelia Copa 10/06/2018, 3:37 AM

## 2018-10-06 NOTE — Progress Notes (Signed)
Patient with continued respiratory decompensation requiring intubation.  Easy intubation 20 mg etomidate for sedation 7.5 et tube.  Lots of frothy fluid consistent with pulmonary edema.  On CXR she has a new RUL pna. Needs to have tube withdrawn 2 cm.  Respiratory aware.  Good bilateral breath sounds

## 2018-10-06 NOTE — Progress Notes (Signed)
[place] for Heparin Indication: atrial fibrillation  Allergies  Allergen Reactions  . Doxycycline     REACTION: Wheals and pruritus    Patient Measurements: Height: 5' (152.4 cm) Weight: 238 lb 8.6 oz (108.2 kg) IBW/kg (Calculated) : 45.5  Vital Signs: Temp: 101.6 F (38.7 C) (06/11 0819) Temp Source: Oral (06/11 0819) BP: 83/37 (06/11 0815) Pulse Rate: 60 (06/11 0815)  Labs: Recent Labs    10/03/18 1212  10/04/18 0410  10/05/18 0314 10/05/18 1552 10/06/18 0300 10/06/18 0353 10/06/18 0556  HGB  --    < > 10.0*  --  9.9*  --  9.7* 11.2* 11.6*  HCT  --    < > 34.3*  --  34.3*  --  33.9* 33.0* 34.0*  PLT  --   --  85*  --  76*  --  134*  --   --   LABPROT 14.5  --   --   --   --   --   --   --   --   INR 1.1  --   --   --   --   --   --   --   --   HEPARINUNFRC  --   --  0.44  --  0.28*  --  0.33  --   --   CREATININE  --    < > 2.05*   < > 1.78* 1.85* 2.78*  --   --    < > = values in this interval not displayed.    Estimated Creatinine Clearance: 22.2 mL/min (A) (by C-G formula based on SCr of 2.78 mg/dL (H)).  Assessment: Pt is a 67yoF with a history of afib. She was on Eliquis PTA, received in hospital, last dose 5/29. Apixaban on hold for potential need for [facility], currently on CRRT. Pharmacy consulted to dose heparin  Heparin level therapeutic at 0.33 on heparin 900 units/hr. Hgb remains low but stable. No signs/symptoms of bleeding or issues with infusion.    Goal of Therapy:  Heparin level 0.3-0.7 units/ml  Monitor platelets by anticoagulation protocol: Yes   Plan:  -Continue heparin at 900 units/hr -Heparin level and CBC daily -Follow plans to transition to [place], PharmD PGY2 Cardiology Pharmacy Resident Please check AMION for all Pharmacist numbers by unit 10/06/2018 8:35 AM

## 2018-10-06 NOTE — Progress Notes (Addendum)
NAME:  Kathleen [name], MRN:  [id number], DOB:  [birth year], LOS: 40 ADMISSION DATE:  09/22/2018, CONSULTATION DATE: 09/24/2018 REFERRING MD: Candee Furbish MD, CHIEF COMPLAINT: Cardiac arrest  Brief History   68 y/o obese woman with chronic diastolic heart failure who failed outpatient diuretics management due to chronic kidney disease.  Right heart cath on 5/29 showed markedly elevated biventricular filling pressures with normal cardiac output.  She was on cardiology service, maintained on milrinone and Lasix.  On the morning of 5/30 she went into PEA arrest, CPR performed for 8 minutes.  Intubated and transferred to ICU.  Started on CRRT 5/31.  Extubated 6/3.  Re-intubated early am 6/11 for hypercarbic respiratory failure, possible RUL PNA.  Past Medical History   has a past medical history of Atrial flutter ([place]), Bradycardia, Chronic diastolic (congestive) heart failure ([place]) (01/10/2015), CKD (chronic kidney disease), stage IV ([place]), Degenerative joint disease of hand, Diabetes mellitus, Dyslipidemia, Fecal occult blood test positive, GERD (gastroesophageal reflux disease), Headache, Hypertension, Inadequate material resources, Irritable bowel syndrome, Morbid obesity ([place]), Obesity hypoventilation syndrome ([place]), Post-menopausal bleeding, and Shortness of breath dyspnea.  Significant Hospital Events   5/28 Admit 5/29 RHC 5/30 PEA arrest, intubated/ CRRT 5/31  Milrinone stopped due to ectopy; brady episode- amio stopped 6/02 attempt at SBT, chest x-ray with worsening pulmonary edema, CVVHD for volume removal 6/03 extubated > remain on CRRT.  Refused nocturnal BiPAP 6/04 Refused nocturnal BiPAP 6/11 Intubated early am with hypercarbia, concern for RUL PNA  Consults:  Cardiology PCCM  Nephrology   Procedures:  Lt PICC 5/29 >> OETT 5/30 >> 6/3 R IJ HD cath 5/30 >> Aline left ulnar 5/30 >> 6/2 ETT 6/11 >>   Significant Diagnostic Tests:  TTE 5/28 >>The left ventricle has normal systolic  function with an ejection fraction of 60-65%. The cavity size was normal. Left ventricular diastolic doppler parameters are indeterminate. No evidence of left ventricular regional wall motion abnormalities. The right ventricle has normal systolic function. The cavity was mildly enlarged. There is no increase in right ventricular wall thickness. Right atrial size was mildly dilated. The aortic valve is tricuspid. Mild thickening of the aortic valve. Mild calcification of the aortic valve. Aortic valve regurgitation is trivial by color flow Doppler.The aortic root is normal in size and structure.  Right heart cath 5/29 RA = 24 RV = 94/26 PA = 95/36 (53) PCW = 30 (v = 50) Fick cardiac output/index = 6.0/2.7 PVR =3.9 WU FA sat = 91% PA sat = 58%, 58% SVC 60%  Micro Data:  SARS coronavirus 2 cepheid 5/28 >> neg MRSA PCR 5/28 >> neg Trach aspirate 6/2 >> MSSA BCx2 6/2 >> negative Tracheal aspirate 6/11 >>    Antimicrobials:  Vancomycin 6/3 > 6/4 Cefazolin 6/4 >> 6/10 Vanco 6/11 >>  Cefepime 6/11 >>   Interim history/subjective:  Reintubated early am for lethargy / AMS in setting of hypercarbia.  Chart review shows patient had been refusing BiPAP since extubation. Tmax 102.  Requiring levophed.   Objective   Blood pressure (!) 99/47, pulse (!) 59, temperature (!) 101.6 F (38.7 C), temperature source Oral, resp. rate (!) 23, height 5' (1.524 m), weight 108.2 kg, SpO2 96 %. CVP:  [6 mmHg-16 mmHg] 6 mmHg  Vent Mode: PRVC FiO2 (%):  [65 %-100 %] 100 % Set Rate:  [16 bmp] 16 bmp Vt Set:  [420 mL] 420 mL PEEP:  [5 cmH20] 5 cmH20 Plateau Pressure:  [24 cmH20-31 cmH20] 24 cmH20   Intake/Output  Summary (Last 24 hours) at 10/06/2018 0906 Last data filed at 10/06/2018 0800 Gross per 24 hour  Intake 2723.52 ml  Output 778 ml  Net 1945.52 ml   Filed Weights   10/04/18 0400 10/05/18 0500 10/06/18 0500  Weight: 108.4 kg 106.6 kg 108.2 kg    Examination: General: short, morbidly obese  female lying in bed on vent, ill appearing   HEENT: MM pink/moist, ETT, flickers eyes to voice  Neuro: sedate CV: s1s2 iregular, slow Aflutter on monitor, no m/r/g PULM: even/non-labored, lungs bilaterally with coarse rhonchi  DD:UKGU, non-tender, bsx4 active  Extremities: warm/dry, BLE wrapped with compressive dressing, trace edema  Skin: no rashes or lesions  Resolved Hospital Problem list     Assessment & Plan:   PEA Cardiac Arrest -suspect hypoxia driven Acute on Chronic Diastolic Heart Failure -admit with elevated filling pressures, cardiorenal syndrome  Severe Pulmonary Hypertension -s/p RHC, WHO class 2,3 Permanent AF/AFL -on anticoagulation pre admit P: Continue ICU monitoring  Levophed for SBP > 95  Per Cardiology  Continue Midodrine TID  Acute on Chronic Hypoxemic / Hypercarbic Respiratory Failure  -extubated 6/3, refused bipap, reintubated 6/11 for AMS, hypercarbia  -multifactorial, initially volume overload, tracheal aspirate + for MSSA, new RUL infiltrate 6/11  OSA / OHS  -not compliant with BiPAP while inpatient  P: PRVC 8cc/kg as rest mode  Wean PEEP / fiO2 for sats >90% Follow intermittent CXR  Discontinue gabapentin  ETT positioning adjusted   MSSA PNA / RUL Infiltrate / Possible Aspiration  P: Empiric abx as above  Follow CXR   VT/ NSVT  -off amio due to bradycardia P: Per Cardiology Electrolyte goals: K 4, Mg 2  AKI on CKD  -component of cardiorenal disease  Hyponatremia Hypophosphatemia  Mild Hyperkalemia  P: Appreciate Nephrology assistance  Concern she will not have renal recovery. Doubt she would be a long term HD candidate  Trend BMP / urinary output Replace electrolytes as indicated - NaPhos replaced  Avoid nephrotoxic agents, ensure adequate renal perfusion  DM II P: SSI, resistant scale  Increase to 4 units Q4 for TF coverage  Add 8 units lantus QD   Morbid Obesity  At Risk Malnutrition  P: TF per Nutrition   Best  practice:  Diet: NPO,  TF  Pain/Anxiety/Delirium protocol (if indicated):  In place  VAP protocol (if indicated): In place  DVT prophylaxis: Heparin gtt GI prophylaxis: Pepcid Glucose control: SSI Mobility: Bed Code Status: Full Family Communication: Per primary Disposition: ICU (CRRT)  CC Time: 66 minutes   Noe Gens, NP-C [place] [facility] Pgr: [phone] or if no answer [phone] 10/06/2018, 9:18 AM  Attending Note:  68 year old with extensive cardiac history how presents to PCCM on the morning of 6/11 with hypercarbic respiratory failure.  No further events overnight beyond intubation.  On exam, she is unresponsive on full vent support.  Evidently patient refused to wear her BiPAP and became hypercarbic.  I reviewed CXR myself, infiltrate noted.  Discussed with PCCM-NP.  Will maintain on full vent support for now.  Check ABG and adjust vent for ABG.  CXR in AM.  Titrate O2 for sat of 88-92%.  Will need to discussed HD plans and whether the patient will wear her BiPAP or not to discuss trach options.  The patient is critically ill with multiple organ systems failure and requires high complexity decision making for assessment and support, frequent evaluation and titration of therapies, application of advanced monitoring technologies and extensive  interpretation of multiple databases.   Critical Care Time devoted to patient care services described in this note is  33  Minutes. This time reflects time of care of this signee Dr Jennet Maduro. This critical care time does not reflect procedure time, or teaching time or supervisory time of PA/NP/Med student/Med Resident etc but could involve care discussion time.  Rush Farmer, M.D. [facility] Pulmonary/Critical Care Medicine. Pager: [phone]. After hours pager: [phone].

## 2018-10-06 NOTE — Progress Notes (Signed)
Orthopedic Tech Progress Note Patient Details:  Kathleen [name] [birth year] [id number]  Ortho Devices Type of Ortho Device: Haematologist Ortho Device/Splint Location: Bilateral unna boots Ortho Device/Splint Interventions: Application   Post Interventions Patient Tolerated: Well Instructions Provided: Care of device   [name] 10/06/2018, 2:02 PM

## 2018-10-06 NOTE — Progress Notes (Signed)
Called to the bedside to evaluate patient for lower level of consciousness arousable only to physical stimulation lung with a temperature of 101.8.  Chest x-ray was obtained and while she does have bilateral lower lobe infiltrates most of her lung fields look somewhat better than previous chest x-ray done 10/02/2018.  She has a right IJ and a tunneled CRRT catheter both sites look good along with an IV site in the right arm.  Vancomycin and cefepime have been started.  ABG shows a pH of 7.39 PCO2 of 40 O2 sat of 90% PO2 of 67 on .45 nasal cannula. Etiology of fever is not entirely clear although certainly could be hospital-acquired pneumonia.  She currently is not acidotic or retaining carbon dioxide.  Would like to avoid intubation if possible but may become necessary.  Will place on high flow oxygen and repeat ABG in 2 hours.  We will continue close monitoring.

## 2018-10-06 NOTE — Progress Notes (Signed)
Patient drowsy at the beginning of the shift, but would awaken, follow commands, and answer questions. Patient has now become increasingly drowsy and lethargic. Patient will only stay awake for a few seconds. Sats were previously maintained at around 93%, but they have dropped to 88-89% on 9L HFNC. Patient's breathing more labored. Temp 101.8. Paged Dr. Oletta Darter. Received orders and CCM to bedside to assess patient.

## 2018-10-06 NOTE — Progress Notes (Addendum)
Patient sats 77% on 100%/35L HFNC. Respiratory at bedside. Obtaining new ABG. Paged Dr. Oletta Darter. CCM in route to intubate patient.

## 2018-10-06 NOTE — Procedures (Signed)
Intubation Procedure Note Kathleen [name] [id number] [birth year]  Procedure: Intubation Indications: Respiratory insufficiency  Procedure Details Consent: Unable to obtain consent because of emergent medical necessity. Time Out: Verified patient identification, verified procedure, site/side was marked, verified correct patient position, special equipment/implants available, medications/allergies/relevent history reviewed, required imaging and test results available.  Performed  Maximum sterile technique was used including gloves and mask.  3    Evaluation Hemodynamic Status: BP stable throughout; O2 sats: stable throughout Patient's Current Condition: stable Complications: No apparent complications Patient did tolerate procedure well. Chest X-ray ordered to verify placement.  CXR: pending.   Shellia Cleverly 10/06/2018

## 2018-10-06 NOTE — Progress Notes (Signed)
Patient ID: Kathleen [name], female   DOB: 03-Aug-1950, 68 y.o.   MRN: [id number]    Advanced Heart Failure Rounding Note   Subjective:     Decompensated last night with increasing lethargy. ABG 7.28/53/55/79%.  CXR with worsening RUL infiltrate  Reintubated early this am.   Now intubated and sedated. On NE 10. CVP 5 Anuric    Objective:   Weight Range:  Vital Signs:   Temp:  [97.8 F (36.6 C)-102 F (38.9 C)] 101.6 F (38.7 C) (06/11 0819) Pulse Rate:  [44-66] 60 (06/11 0815) Resp:  [12-35] 13 (06/11 0815) BP: (69-126)/(35-67) 83/37 (06/11 0815) SpO2:  [78 %-96 %] 95 % (06/11 0815) FiO2 (%):  [65 %-100 %] 100 % (06/11 0803) Weight:  [108.2 kg] 108.2 kg (06/11 0500) Last BM Date: 10/04/18  Weight change: Filed Weights   10/04/18 0400 10/05/18 0500 10/06/18 0500  Weight: 108.4 kg 106.6 kg 108.2 kg    Intake/Output:   Intake/Output Summary (Last 24 hours) at 10/06/2018 0835 Last data filed at 10/06/2018 0700 Gross per 24 hour  Intake 2651.07 ml  Output 913 ml  Net 1738.07 ml     Physical Exam: General:  Intubated sedated HEENT: normal +ETT Neck: supple. JVP flat. Carotids 2+ bilat; no bruits. No lymphadenopathy or thryomegaly appreciated. Cor: PMI nondisplaced. Irregular rate & rhythm. No rubs, gallops or murmurs. Lungs: coarse Abdomen: obese soft, nontender, nondistended. No hepatosplenomegaly. No bruits or masses. Good bowel sounds. Extremities: no cyanosis, clubbing, rash, tr edema Neuro: intubated sedated      Telemetry: Slow AFL 50-60s. Personally reviewed   Labs: Basic Metabolic Panel: Recent Labs  Lab 10/02/18 0411  10/04/18 0410 10/04/18 1634 10/05/18 0314 10/05/18 1552 10/06/18 0300 10/06/18 0353 10/06/18 0556  NA 139   < > 134* 135 135 135 133* 135 136  K 4.4   < > 4.5 4.4 4.3 4.0 5.5* 5.5* 5.5*  CL 102   < > 99 103 102 102 100  --   --   CO2 27   < > _0 --   --   GLUCOSE 215*   < > 279* 261* 296* 304* 313*  --   --     BUN 40*   < > 40* 33* 33* 29* 47*  --   --   CREATININE 2.12*   < > 2.05* 1.84* 1.78* 1.85* 2.78*  --   --   CALCIUM 8.3*   < > 8.1* 7.7* 7.7* 7.2* 8.0*  --   --   MG 2.6*  --   --   --   --   --   --   --   --   PHOS 2.9   < > 2.0* 1.6* 1.7* 1.4* 1.3*  --   --    < > = values in this interval not displayed.    Liver Function Tests: Recent Labs  Lab 10/04/18 0410 10/04/18 1634 10/05/18 0314 10/05/18 1552 10/06/18 0300  ALBUMIN 2.1* 2.1* 2.0* 1.8* 1.9*   No results for input(s): LIPASE, AMYLASE in the last 168 hours. No results for input(s): AMMONIA in the last 168 hours.  CBC: Recent Labs  Lab 10/02/18 0411 10/03/18 0409 10/04/18 0410 10/05/18 0314 10/06/18 0300 10/06/18 0353 10/06/18 0556  WBC 5.0 4.6 5.4 6.8 9.0  --   --   NEUTROABS 2.6  --   --   --   --   --   --   HGB 10.8*  10.3* 10.0* 9.9* 9.7* 11.2* 11.6*  HCT 37.4 35.4* 34.3* 34.3* 33.9* 33.0* 34.0*  MCV 84.8 85.5 86.4 86.8 86.9  --   --   PLT 78* 73* 85* 76* 134*  --   --     Cardiac Enzymes: No results for input(s): CKTOTAL, CKMB, CKMBINDEX, TROPONINI in the last 168 hours.  BNP: BNP (last 3 results) Recent Labs    09/22/18 1420  BNP 451.2*    ProBNP (last 3 results) No results for input(s): PROBNP in the last 8760 hours.    Other results:  Imaging: Portable Chest X-ray  Result Date: 10/06/2018 CLINICAL DATA:  Endotracheal tube placement.  Respiratory failure. EXAM: PORTABLE CHEST 1 VIEW COMPARISON:  One-view chest x-ray 10/06/2018 at 3:40 a.m. FINDINGS: The patient has now been intubated. The endotracheal tube terminates at the level the carina, directed towards the right mainstem bronchus. Progressive right upper lobe airspace consolidation is present. Aeration at the right base and left lung is improved. Small bore feeding tube extends off the inferior border the film. Right IJ line is stable. IMPRESSION: 1. Endotracheal tube terminates at the level the carina and could be pulled back  approximately 3 cm for more optimal positioning. 2. Increased right upper lobe airspace consolidation. 3. Improved aeration of the left lung and right lower lobe. These results were called by telephone at the time of interpretation on 10/06/2018 at 7:50am to Dr. Nelda Marseille, who verbally acknowledged these results. Electronically Signed   By: San Morelle M.D.   On: 10/06/2018 08:07   Dg Chest Port 1 View  Result Date: 10/06/2018 CLINICAL DATA:  Fever and hypoxia. EXAM: PORTABLE CHEST 1 VIEW COMPARISON:  One-view chest x-ray 10/02/2018 FINDINGS: The heart is enlarged. New right IJ catheter is in place. Feeding tube courses off the inferior border the film. Left-sided PICC line terminates near the azygos. There is increase in a diffuse interstitial and airspace pattern. No significant effusions are present. IMPRESSION: 1. Increasing diffuse interstitial and airspace disease consistent with edema or ARDS. Infection is not excluded. 2. New right IJ catheter without pneumothorax. Electronically Signed   By: San Morelle M.D.   On: 10/06/2018 04:56     Medications:     Scheduled Medications:  atorvastatin  40 mg Oral q1800   chlorhexidine gluconate (MEDLINE KIT)  15 mL Mouth Rinse BID   Chlorhexidine Gluconate Cloth  6 each Topical Daily   feeding supplement (PRO-STAT SUGAR FREE 64)  30 mL Per Tube TID   feeding supplement (VITAL 1.5 CAL)  1,000 mL Per Tube Q24H   fentaNYL (SUBLIMAZE) injection  25 mcg Intravenous Once   gabapentin  100 mg Per Tube Q12H   insulin aspart  0-20 Units Subcutaneous Q4H   insulin aspart  3 Units Subcutaneous Q4H   mouth rinse  15 mL Mouth Rinse 10 times per day   midodrine  10 mg Oral TID WC    Infusions:   prismasol BGK 4/2.5      prismasol BGK 4/2.5     sodium chloride     sodium chloride 10 mL/hr at 10/06/18 0700   famotidine (PEPCID) IV Stopped (10/05/18 1607)   fentaNYL infusion INTRAVENOUS 25 mcg/hr (10/06/18 0700)   heparin  900 Units/hr (10/06/18 0700)   norepinephrine (LEVOPHED) Adult infusion 10 mcg/min (10/06/18 0700)   prismasol BGK 4/2.5     sodium chloride      PRN Medications: Place/Maintain arterial line **AND** sodium chloride, sodium chloride, acetaminophen (TYLENOL) oral liquid 160 mg/5 mL,  bisacodyl, docusate, fentaNYL, heparin, midazolam, midazolam, ondansetron, risperiDONE, sodium chloride, sodium chloride flush   Assessment/Plan:   1. PEA arrest on 5/30 - due to hypoxia/pulmonary edema. - now stable  2. Acute on chronic hypoxic respiratory failure - has underlying  OHS/OSA  - intubated on 5/30 in setting of arrest.  - treated for MSSA PNA  On cefazolin day 6/7 - extubated 6/3.  - reintubated 6/11 with worsening RUL aispace disease.  - abx broadened to cefipime. CCM managing   3.  Acute on chronic diastolic HF - Admitted with markedly elevated filling pressures complicated by severe cardiorenal syndrome - Now down 65 pounds - Now improving with CVVHD with CVP down to 5  Still requring NE in addition to midodrine - Volume status appears to be at baseline. .   - Anuric. Suspect she may progress to long-term HD - Givne low CVP and possible sepsis would not pull more fluid at this point,  - ? Need to w/u for possible TTR amyloid  4. Pulmonary HTN, severe  - this is mostly pulmonary venous HTN by cath [facility] 2) but also has a component of OHS/OSA (WHO group 3) - no role for selective pulmonary vasodilators - will improve significantly with volume removal - consider repeat RHC after volume removal   5. AKI on CKD 3 - baseline creatinine 1.9 -> ESRD - likely cardiorenal - We have suspected that she may progress to long-term HD. Remains anuric  6. Morbid obesity - desperately needs weight loss  6. Chronic AF/AFL - Off apixaban. Now on heparin with CVVHD.Marland Kitchen No bleeding. Discussed dosing with PharmD personally. - Rhythm looks to be slow AFL currently - no change  (has had  some junctional and sinus brady)  7. VT/NSVT - off amio due to bradycardia - Keep K> 4.0 Mg 2.0  8. MSSA PNA - Bcx drawn 6/2. Negative - sputum cx with MSSA PNA - CXR with worsening RUL PNA. Cefazolin switched to cefipime. Cultures repeated   Will continue to follow while in ICU but when transfers out of unit should go to [facility].   CRITICAL CARE Performed by: Glori Bickers  Total critical care time: 45 minutes  Critical care time was exclusive of separately billable procedures and treating other patients.  Critical care was necessary to treat or prevent imminent or life-threatening deterioration.  Critical care was time spent personally by me (independent of midlevel providers or residents) on the following activities: development of treatment plan with patient and/or surrogate as well as nursing, discussions with consultants, evaluation of patient's response to treatment, examination of patient, obtaining history from patient or surrogate, ordering and performing treatments and interventions, ordering and review of laboratory studies, ordering and review of radiographic studies, pulse oximetry and re-evaluation of patient's condition.    Length of Stay: 14  Glori Bickers MD 10/06/2018, 8:35 AM  Advanced Heart Failure Team Pager [phone] (M-F; [place])  Please contact [facility] for night-coverage after hours (4p -7a ) and weekends on [email or web address]

## 2018-10-06 NOTE — Progress Notes (Signed)
Sputum culture collected, sent to lab.  

## 2018-10-07 ENCOUNTER — Inpatient Hospital Stay (HOSPITAL_COMMUNITY): Payer: Medicare HMO

## 2018-10-07 DIAGNOSIS — R570 Cardiogenic shock: Secondary | ICD-10-CM

## 2018-10-07 LAB — RENAL FUNCTION PANEL
Albumin: 1.7 g/dL — ABNORMAL LOW (ref 3.5–5.0)
Albumin: 1.8 g/dL — ABNORMAL LOW (ref 3.5–5.0)
Albumin: 1.8 g/dL — ABNORMAL LOW (ref 3.5–5.0)
Anion gap: 10 (ref 5–15)
Anion gap: 8 (ref 5–15)
Anion gap: 6 (ref 5–15)
BUN: 34 mg/dL — ABNORMAL HIGH (ref 8–23)
BUN: 36 mg/dL — ABNORMAL HIGH (ref 8–23)
BUN: 31 mg/dL — ABNORMAL HIGH (ref 8–23)
CO2: 21 mmol/L — ABNORMAL LOW (ref 22–32)
CO2: 23 mmol/L (ref 22–32)
CO2: 25 mmol/L (ref 22–32)
Calcium: 7.4 mg/dL — ABNORMAL LOW (ref 8.9–10.3)
Calcium: 7.4 mg/dL — ABNORMAL LOW (ref 8.9–10.3)
Calcium: 8 mg/dL — ABNORMAL LOW (ref 8.9–10.3)
Chloride: 105 mmol/L (ref 98–111)
Chloride: 106 mmol/L (ref 98–111)
Chloride: 103 mmol/L (ref 98–111)
Creatinine, Ser: 1.99 mg/dL — ABNORMAL HIGH (ref 0.44–1.00)
Creatinine, Ser: 2.05 mg/dL — ABNORMAL HIGH (ref 0.44–1.00)
Creatinine, Ser: 1.76 mg/dL — ABNORMAL HIGH (ref 0.44–1.00)
GFR calc Af Amer: 29 mL/min — ABNORMAL LOW (ref >60)
GFR calc Af Amer: 30 mL/min — ABNORMAL LOW (ref >60)
GFR calc Af Amer: 34 mL/min — ABNORMAL LOW (ref >60)
GFR calc non Af Amer: 25 mL/min — ABNORMAL LOW (ref >60)
GFR calc non Af Amer: 26 mL/min — ABNORMAL LOW (ref >60)
GFR calc non Af Amer: 30 mL/min — ABNORMAL LOW (ref >60)
Glucose, Bld: 227 mg/dL — ABNORMAL HIGH (ref 70–99)
Glucose, Bld: 238 mg/dL — ABNORMAL HIGH (ref 70–99)
Glucose, Bld: 258 mg/dL — ABNORMAL HIGH (ref 70–99)
Phosphorus: 2.1 mg/dL — ABNORMAL LOW (ref 2.5–4.6)
Phosphorus: 2.1 mg/dL — ABNORMAL LOW (ref 2.5–4.6)
Phosphorus: 2.4 mg/dL — ABNORMAL LOW (ref 2.5–4.6)
Potassium: 4.6 mmol/L (ref 3.5–5.1)
Potassium: 4.6 mmol/L (ref 3.5–5.1)
Potassium: 5.3 mmol/L — ABNORMAL HIGH (ref 3.5–5.1)
Sodium: 136 mmol/L (ref 135–145)
Sodium: 137 mmol/L (ref 135–145)
Sodium: 134 mmol/L — ABNORMAL LOW (ref 135–145)

## 2018-10-07 LAB — CBC
HCT: 32.8 % — ABNORMAL LOW (ref 36.0–46.0)
Hemoglobin: 9.4 g/dL — ABNORMAL LOW (ref 12.0–15.0)
MCH: 25.1 pg — ABNORMAL LOW (ref 26.0–34.0)
MCHC: 28.7 g/dL — ABNORMAL LOW (ref 30.0–36.0)
MCV: 87.5 fL (ref 80.0–100.0)
Platelets: 129 10*3/uL — ABNORMAL LOW (ref 150–400)
RBC: 3.75 MIL/uL — ABNORMAL LOW (ref 3.87–5.11)
RDW: 20.5 % — ABNORMAL HIGH (ref 11.5–15.5)
WBC: 23.2 10*3/uL — ABNORMAL HIGH (ref 4.0–10.5)
nRBC: 0.3 % — ABNORMAL HIGH (ref 0.0–0.2)

## 2018-10-07 LAB — GLUCOSE, CAPILLARY
Glucose-Capillary: 208 mg/dL — ABNORMAL HIGH (ref 70–99)
Glucose-Capillary: 185 mg/dL — ABNORMAL HIGH (ref 70–99)
Glucose-Capillary: 188 mg/dL — ABNORMAL HIGH (ref 70–99)
Glucose-Capillary: 229 mg/dL — ABNORMAL HIGH (ref 70–99)
Glucose-Capillary: 271 mg/dL — ABNORMAL HIGH (ref 70–99)

## 2018-10-07 LAB — CORTISOL: Cortisol, Plasma: 23 ug/dL

## 2018-10-07 LAB — HEPARIN LEVEL (UNFRACTIONATED): Heparin Unfractionated: 0.38 IU/mL (ref 0.30–0.70)

## 2018-10-07 LAB — MAGNESIUM: Magnesium: 2.4 mg/dL (ref 1.7–2.4)

## 2018-10-07 MED ORDER — INSULIN GLARGINE 100 UNIT/ML ~~LOC~~ SOLN
10.0000 [IU] | Freq: Every day | SUBCUTANEOUS | Status: DC
  Administered 2018-10-07 – 2018-10-08 (×2): 10 [IU] via SUBCUTANEOUS
  Filled 2018-10-07 (×3): qty 0.1

## 2018-10-07 MED ORDER — ETOMIDATE 2 MG/ML IV SOLN
INTRAVENOUS | Status: AC
  Administered 2018-10-07: 10 mg
  Filled 2018-10-07: qty 10

## 2018-10-07 MED ORDER — VITAL 1.5 CAL PO LIQD
1000.0000 mL | ORAL | Status: DC
  Administered 2018-10-07 – 2018-10-17 (×11): 1000 mL
  Filled 2018-10-07 (×14): qty 1000

## 2018-10-07 MED ORDER — HYDROCORTISONE NA SUCCINATE PF 100 MG IJ SOLR
50.0000 mg | Freq: Four times a day (QID) | INTRAMUSCULAR | Status: DC
  Administered 2018-10-07 – 2018-10-17 (×40): 50 mg via INTRAVENOUS
  Filled 2018-10-07 (×40): qty 2

## 2018-10-07 MED ORDER — POTASSIUM PHOSPHATES 15 MMOLE/5ML IV SOLN
10.0000 mmol | Freq: Once | INTRAVENOUS | Status: AC
  Administered 2018-10-07: 10 mmol via INTRAVENOUS
  Filled 2018-10-07: qty 3.33

## 2018-10-07 NOTE — Procedures (Signed)
Arterial Catheter Insertion Procedure Note TANEASHA FUQUA [id number] [birth year]  Procedure: Insertion of Arterial Catheter  Indications: Blood pressure monitoring and Frequent blood sampling  Procedure Details Consent: Unable to obtain consent because of altered level of consciousness. Time Out: Verified patient identification, verified procedure, site/side was marked, verified correct patient position, special equipment/implants available, medications/allergies/relevent history reviewed, required imaging and test results available.  Performed  Maximum sterile technique was used including antiseptics, cap, gloves, gown, hand hygiene, mask and sheet. Skin prep: Chlorhexidine; local anesthetic administered 20 gauge catheter was inserted into left radial artery using the Seldinger technique. ULTRASOUND GUIDANCE USED: NO Evaluation Blood flow good; BP tracing good. Complications: No apparent complications.   Jennet Maduro 10/07/2018

## 2018-10-07 NOTE — Progress Notes (Signed)
[facility] Progress Note    Assessment/ Plan:   68 y.o.yo femaleHTN, Afib, DM, morbid obesity, diastolic heart failure and also baseline CKD- crt mid to high 1's who was admitted on 5/28/2020with volume overload, then suffered cardiac arrest   1. Renal- A on CRF after cardiac arrest on 5/30. Baseline crt in the mid to high 1's. Required initiation of CRRT early on 5/30- overall negative fluid balance on CRRT- 21.8liters (w/ . had been running even prior to CRRT being stopped 6/10 (5/30-6/10).   Hopeful for eventual recovery but no signs yet  Appreciateconversion to TC by VIR on 6/8(on heparin gtt). Restarted CRRT on 6/11  Titrate  Levophed based on systolic and if we can get the req down or off then start UF @ 50 ml/hr.  2. HTN/volume- ToleratedUF so far- CVP 20 but thru PICC line (previously in 4-5 rang). 3. Anemia- hgb close to 10 - no meds yet 4. PNA? 5. Elytes.- replete Phos (3mol ordered 6/11 733a)  Subjective:   On Levo higher dose today  Intubated 6/11 AM for resp distress; frothy sputum      Objective:   BP (!) 105/45   Pulse (!) 47   Temp 97.7 F (36.5 C) (Oral)   Resp 13   Ht 5' (1.524 m)   Wt 105 kg   SpO2 91%   BMI 45.21 kg/m   Intake/Output Summary (Last 24 hours) at 10/07/2018 03546Last data filed at 10/07/2018 0600 Gross per 24 hour  Intake 2003.45 ml  Output 1735 ml  Net 268.45 ml   Weight change: -3.2 kg  Physical Exam: General: Intubated  Heart: brady overnight Lungs: CBS bilat Abdomen: obese, non tender Extremities:edema better but still present Dialysis Access: right IJ vascath placed 5/30-> converted to RIJ TC 6/8  Imaging: Dg Chest Port 1 View  Result Date: 10/07/2018 CLINICAL DATA:  Respiratory failure EXAM: PORTABLE CHEST 1 VIEW COMPARISON:  10/06/2018 FINDINGS: Cardiac shadow remains enlarged. Endotracheal tube, feeding catheter and dialysis catheter are again seen and stable. Left PICC line is  noted as well. Patchy bilateral infiltrates are noted slightly improved in the right upper lobe but overall increased in the left lung when compared with the prior exam. No sizable effusion is noted. IMPRESSION: Waxing and waning infiltrates bilaterally slightly improved on the right but worsened on the left. Tubes and lines as described above. Electronically Signed   By: MInez CatalinaM.D.   On: 10/07/2018 07:26   Dg Chest Port 1 View  Result Date: 10/06/2018 CLINICAL DATA:  Endotracheal tube adjustment. EXAM: PORTABLE CHEST 1 VIEW COMPARISON:  10/06/2018 FINDINGS: Endotracheal tube has been pulled back and has tip 2.8 cm above the carina. Enteric tube courses into the region of the stomach and off the film as tip is not visualized. Left-sided PICC line and right IJ central venous catheter unchanged. Lungs are adequately inflated demonstrate stable airspace consolidation over the right upper lobe with mild patchy left perihilar and right basilar opacification. No effusion. No pneumothorax. Mild stable cardiomegaly. Remainder of the exam is unchanged. IMPRESSION: Multifocal airspace process worse over the right upper lobe without significant change likely multifocal pneumonia. Tubes and lines as described. Endotracheal tube has been pulled back with tip 2.8 cm above the carina. Electronically Signed   By: DMarin OlpM.D.   On: 10/06/2018 08:44   Portable Chest X-ray  Result Date: 10/06/2018 CLINICAL DATA:  Endotracheal tube placement.  Respiratory failure. EXAM: PORTABLE CHEST 1 VIEW COMPARISON:  One-view chest x-ray 10/06/2018 at 3:40 a.m. FINDINGS: The patient has now been intubated. The endotracheal tube terminates at the level the carina, directed towards the right mainstem bronchus. Progressive right upper lobe airspace consolidation is present. Aeration at the right base and left lung is improved. Small bore feeding tube extends off the inferior border the film. Right IJ line is stable. IMPRESSION:  1. Endotracheal tube terminates at the level the carina and could be pulled back approximately 3 cm for more optimal positioning. 2. Increased right upper lobe airspace consolidation. 3. Improved aeration of the left lung and right lower lobe. These results were called by telephone at the time of interpretation on 10/06/2018 at 7:50am to Dr. Nelda Marseille, who verbally acknowledged these results. Electronically Signed   By: San Morelle M.D.   On: 10/06/2018 08:07   Dg Chest Port 1 View  Result Date: 10/06/2018 CLINICAL DATA:  Fever and hypoxia. EXAM: PORTABLE CHEST 1 VIEW COMPARISON:  One-view chest x-ray 10/02/2018 FINDINGS: The heart is enlarged. New right IJ catheter is in place. Feeding tube courses off the inferior border the film. Left-sided PICC line terminates near the azygos. There is increase in a diffuse interstitial and airspace pattern. No significant effusions are present. IMPRESSION: 1. Increasing diffuse interstitial and airspace disease consistent with edema or ARDS. Infection is not excluded. 2. New right IJ catheter without pneumothorax. Electronically Signed   By: San Morelle M.D.   On: 10/06/2018 04:56    Labs: BMET Recent Labs  Lab 10/04/18 0410 10/04/18 1634 10/05/18 0314 10/05/18 1552 10/06/18 0300 10/06/18 0353 10/06/18 0556 10/06/18 0854 10/06/18 1125 10/06/18 1600 10/07/18 0310  NA 134* 135 135 135 133* 135 136 136 136 134* 137  K 4.5 4.4 4.3 4.0 5.5* 5.5* 5.5* 5.6* 5.5* 5.0 4.6  CL 99 103 102 102 100  --   --   --   --  102 106  CO2 24 24 25 23 23   --   --   --   --  23 23  GLUCOSE 279* 261* 296* 304* 313*  --   --   --   --  306* 238*  BUN 40* 33* 33* 29* 47*  --   --   --   --  46* 36*  CREATININE 2.05* 1.84* 1.78* 1.85* 2.78*  --   --   --   --  2.75* 1.99*  CALCIUM 8.1* 7.7* 7.7* 7.2* 8.0*  --   --   --   --  7.8* 7.4*  PHOS 2.0* 1.6* 1.7* 1.4* 1.3*  --   --   --   --  2.6 2.1*   CBC Recent Labs  Lab 10/02/18 0411  10/04/18 0410  10/05/18 0314 10/06/18 0300  10/06/18 0556 10/06/18 0854 10/06/18 1125 10/07/18 0310  WBC 5.0   < > 5.4 6.8 9.0  --   --   --   --  23.2*  NEUTROABS 2.6  --   --   --   --   --   --   --   --   --   HGB 10.8*   < > 10.0* 9.9* 9.7*   < > 11.6* 11.6* 11.2* 9.4*  HCT 37.4   < > 34.3* 34.3* 33.9*   < > 34.0* 34.0* 33.0* 32.8*  MCV 84.8   < > 86.4 86.8 86.9  --   --   --   --  87.5  PLT 78*   < > 85*  76* 134*  --   --   --   --  129*   < > = values in this interval not displayed.    Medications:    . atorvastatin  40 mg Oral q1800  . chlorhexidine gluconate (MEDLINE KIT)  15 mL Mouth Rinse BID  . Chlorhexidine Gluconate Cloth  6 each Topical Daily  . feeding supplement (PRO-STAT SUGAR FREE 64)  30 mL Per Tube QID  . feeding supplement (VITAL 1.5 CAL)  1,000 mL Per Tube Q24H  . fentaNYL (SUBLIMAZE) injection  25 mcg Intravenous Once  . insulin aspart  0-20 Units Subcutaneous Q4H  . insulin aspart  4 Units Subcutaneous Q4H  . insulin glargine  8 Units Subcutaneous Daily  . mouth rinse  15 mL Mouth Rinse 10 times per day  . midodrine  10 mg Oral TID WC      Otelia Santee, MD 10/07/2018, 7:29 AM

## 2018-10-07 NOTE — Progress Notes (Signed)
Inpatient Diabetes Program Recommendations  AACE/ADA: New Consensus Statement on Inpatient Glycemic Control (2015)  Target Ranges:  Prepandial:   less than 140 mg/dL      Peak postprandial:   less than 180 mg/dL (1-2 hours)      Critically ill patients:  140 - 180 mg/dL   Lab Results  Component Value Date   GLUCAP 185 (H) 10/07/2018   HGBA1C 7.4 (A) 09/13/2018    Review of Glycemic Control Results for Kathleen [name], Kathleen [name] (MRN [id number]) as of 10/07/2018 11:13  Ref. Range 10/06/2018 16:21 10/06/2018 19:57 10/06/2018 23:52 10/07/2018 04:17 10/07/2018 07:41  Glucose-Capillary Latest Ref Range: 70 - 99 mg/dL 305 (H) 288 (H) 249 (H) 208 (H) 185 (H)   Home meds:  Levemir 65 units daily, Victoza 1.8 mg daily, Metformin 500 mg bid Current orders:  Lantus 10 units daily, Novolog 4 units q 4 hours, Novolog resistant q 4 hours  Inpatient Diabetes Program Recommendations:   Consider d/c of Lantus and change to Levemir 8 units bid.    Will follow.   Thanks  Adah Perl, RN, BC-ADM Inpatient Diabetes Coordinator Pager [phone] (8a-5p)

## 2018-10-07 NOTE — Progress Notes (Signed)
[place] for Heparin Indication: atrial fibrillation  Allergies  Allergen Reactions  . Doxycycline     REACTION: Wheals and pruritus    Patient Measurements: Height: 5' (152.4 cm) Weight: 231 lb 7.7 oz (105 kg) IBW/kg (Calculated) : 45.5  Vital Signs: Temp: 97.5 F (36.4 C) (06/12 0749) Temp Source: Oral (06/12 0749) BP: 105/45 (06/12 0645) Pulse Rate: 47 (06/12 0645)  Labs: Recent Labs    10/05/18 0314  10/06/18 0300  10/06/18 0854 10/06/18 1125 10/06/18 1600 10/07/18 0310  HGB 9.9*  --  9.7*   < > 11.6* 11.2*  --  9.4*  HCT 34.3*  --  33.9*   < > 34.0* 33.0*  --  32.8*  PLT 76*  --  134*  --   --   --   --  129*  HEPARINUNFRC 0.28*  --  0.33  --   --   --   --  0.38  CREATININE 1.78*   < > 2.78*  --   --   --  2.75* 1.99*   < > = values in this interval not displayed.    Estimated Creatinine Clearance: 30.4 mL/min (A) (by C-G formula based on SCr of 1.99 mg/dL (H)).  Assessment: Pt is a 67yoF with a history of afib. She was on Eliquis PTA, received in hospital, last dose 5/29. Apixaban on hold for potential need for [facility], currently on CRRT. Pharmacy consulted to dose heparin  Heparin level therapeutic at 0.38 on heparin 900 units/hr. Hgb remains low but relatively stable. No signs/symptoms of bleeding or issues with infusion. Significant bruising noted on arms and thighs.    Goal of Therapy:  Heparin level 0.3-0.7 units/ml  Monitor platelets by anticoagulation protocol: Yes   Plan:  -Continue heparin at 900 units/hr, will pause temporarily for a line insertion -Heparin level and CBC daily -Follow plans to transition to [place] once extubated   Claiborne Billings, PharmD PGY2 Cardiology Pharmacy Resident Please check AMION for all Pharmacist numbers by unit 10/07/2018 8:08 AM

## 2018-10-07 NOTE — Progress Notes (Addendum)
NAME:  Kathleen [name], MRN:  [id number], DOB:  [birth year], LOS: 17 ADMISSION DATE:  09/22/2018, CONSULTATION DATE: 09/24/2018 REFERRING MD: Candee Furbish MD, CHIEF COMPLAINT: Cardiac arrest  Brief History   68 y/o obese woman with chronic diastolic heart failure who failed outpatient diuretics management due to chronic kidney disease.  Right heart cath on 5/29 showed markedly elevated biventricular filling pressures with normal cardiac output.  She was on cardiology service, maintained on milrinone and Lasix.  On the morning of 5/30 she went into PEA arrest, CPR performed for 8 minutes.  Intubated and transferred to ICU.  Started on CRRT 5/31.  Extubated 6/3.  Re-intubated early am 6/11 for hypercarbic respiratory failure, possible RUL PNA.  Past Medical History   has a past medical history of Atrial flutter ([place]), Bradycardia, Chronic diastolic (congestive) heart failure ([place]) (01/10/2015), CKD (chronic kidney disease), stage IV ([place]), Degenerative joint disease of hand, Diabetes mellitus, Dyslipidemia, Fecal occult blood test positive, GERD (gastroesophageal reflux disease), Headache, Hypertension, Inadequate material resources, Irritable bowel syndrome, Morbid obesity ([place]), Obesity hypoventilation syndrome ([place]), Post-menopausal bleeding, and Shortness of breath dyspnea.  Significant Hospital Events   5/28 Admit 5/29 RHC 5/30 PEA arrest, intubated/ CRRT 5/31 Milrinone stopped due to ectopy; brady episode- amio stopped 6/02 Attempt at SBT, chest x-ray with worsening pulmonary edema, CVVHD for volume removal 6/03 Extubated > remain on CRRT.  Refused nocturnal BiPAP 6/04 Refused nocturnal BiPAP 6/11 Intubated early am with hypercarbia, concern for RUL PNA  Consults:  Cardiology PCCM  Nephrology   Procedures:  Lt PICC 5/29 >> OETT 5/30 >> 6/3 R IJ HD cath 5/30 >> Aline left ulnar 5/30 >> 6/2 ETT 6/11 >>   Significant Diagnostic Tests:  TTE 5/28 >>The left ventricle has normal systolic  function with an ejection fraction of 60-65%. The cavity size was normal. Left ventricular diastolic doppler parameters are indeterminate. No evidence of left ventricular regional wall motion abnormalities. The right ventricle has normal systolic function. The cavity was mildly enlarged. There is no increase in right ventricular wall thickness. Right atrial size was mildly dilated. The aortic valve is tricuspid. Mild thickening of the aortic valve. Mild calcification of the aortic valve. Aortic valve regurgitation is trivial by color flow Doppler.The aortic root is normal in size and structure.  Right heart cath 5/29 RA = 24 RV = 94/26 PA = 95/36 (53) PCW = 30 (v = 50) Fick cardiac output/index = 6.0/2.7 PVR =3.9 WU FA sat = 91% PA sat = 58%, 58% SVC 60%  Micro Data:  SARS coronavirus 2 cepheid 5/28 >> neg MRSA PCR 5/28 >> neg Trach aspirate 6/2 >> MSSA BCx2 6/2 >> negative Tracheal aspirate 6/11 >> few GNR >> BCx2 6/11 >>  Antimicrobials:  Vancomycin 6/3 > 6/4 Cefazolin 6/4 >> 6/10 Vanco 6/11 >>  Cefepime 6/11 >>  Interim history/subjective:  RN reports pts glucose remains elevated.  Pt on 23 mcg's levophed.  Afebrile.  I/O- net even for last 24 hours.   Objective   Blood pressure 102/89, pulse (!) 49, temperature (!) 97.5 F (36.4 C), temperature source Oral, resp. rate 16, height 5' (1.524 m), weight 105 kg, SpO2 93 %. CVP:  [10 mmHg-21 mmHg] 14 mmHg  Vent Mode: PRVC FiO2 (%):  [60 %-100 %] 60 % Set Rate:  [18 bmp] 18 bmp Vt Set:  [420 mL] 420 mL PEEP:  [5 cmH20] 5 cmH20 Plateau Pressure:  [13 cmH20-24 cmH20] 24 cmH20   Intake/Output Summary (Last 24 hours) at  10/07/2018 0918 Last data filed at 10/07/2018 0900 Gross per 24 hour  Intake 2066.47 ml  Output 1982 ml  Net 84.47 ml   Filed Weights   10/05/18 0500 10/06/18 0500 10/07/18 0455  Weight: 106.6 kg 108.2 kg 105 kg    Examination: General: critically ill appearing adult female lying in bed in NAD HEENT: MM  pink/moist, ETT, bruising to left neck Neuro: Awakens, makes eye contact, smiles, nods appropriately  CV: s1s2 rrr, no m/r/g PULM: even/non-labored, lungs bilaterally coarse rhonchi  GE:XBMW, non-tender, bsx4 active  Extremities: cool/dry, no edema, BLE wrapped  Skin: no rashes or lesions  Resolved Hospital Problem list     Assessment & Plan:   PEA Cardiac Arrest -suspect hypoxia driven Acute on Chronic Diastolic Heart Failure -admit with elevated filling pressures, cardiorenal syndrome  Severe Pulmonary Hypertension -s/p RHC, WHO class 2,3 Permanent AF/AFL -on anticoagulation pre admit P: ICU monitoring  Assess cortisol, add stress dose steroids if appropriate Place aline > pending BP review, add vasopressin  Levophed for SBP >90 Midodrine 10 mg TID  Per Cardiology   Acute on Chronic Hypoxemic / Hypercarbic Respiratory Failure  -extubated 6/3, refused bipap, reintubated 6/11 for AMS, hypercarbia  -multifactorial, initially volume overload, tracheal aspirate + for MSSA, new RUL infiltrate 6/11  OSA / OHS  -not compliant with BiPAP while inpatient  P: PRVC 8cc/kg  Wean PEEP / FiO2 for sats >90% Follow intermittent CXR Daily SBT / WUA as able   MSSA PNA / RUL Infiltrate / Possible Aspiration  P: Broad spectrum ABX as above  Follow CXR intermittently    VT/ NSVT  -off amio due to bradycardia P: Per Cardiology  Goal K >4, Mg >2   AKI on CKD  -component of cardiorenal disease  Hyponatremia Hypophosphatemia  Mild Hyperkalemia  P: Appreciate Nephrology input Concerned she will not have renal recovery.  Question if she would be a long term HD candidate Trend BMP / urinary output Replace electrolytes as indicated Avoid nephrotoxic agents, ensure adequate renal perfusion  DM II P: SSI, resistant scale  Increase lantus to 10 units QD TF coverage 4 units Q4  Morbid Obesity  At Risk Malnutrition  P: TF per Nutrition   Best practice:  Diet: NPO,  TF   Pain/Anxiety/Delirium protocol (if indicated):  In place  VAP protocol (if indicated): In place  DVT prophylaxis: Heparin gtt GI prophylaxis: Pepcid Glucose control: SSI Mobility: Bed Code Status: Full Family Communication: Per primary Disposition: ICU (CRRT)  CC Time: 8 minutes   Noe Gens, NP-C [facility] Pgr: [phone] or if no answer [phone] 10/07/2018, 9:18 AM  Attending Note:  68 year old female with CHF and now presenting with PNA and cardiogenic shock.  Patient was diuresed 20L negative with CRRT.  No events overnight.  Diffuse coarse BS and CRRT ongoing even on exam.  I reviewed CXR myself, ETT is in a good position.  Discussed with PCCM-NP.  Will check cortisol level.  Start stress dose steroids.  Place a-line for more accurate BP measures.  Midodrine and levophed at 23 mcg with titration.  CHF service assisting.  Adjust vent per ABG.  Check CVP.  PCCM will continue to follow.  The patient is critically ill with multiple organ systems failure and requires high complexity decision making for assessment and support, frequent evaluation and titration of therapies, application of advanced monitoring technologies and extensive interpretation of multiple databases.   Critical Care Time devoted to patient care services  described in this note is  32  Minutes. This time reflects time of care of this signee Dr Jennet Maduro. This critical care time does not reflect procedure time, or teaching time or supervisory time of PA/NP/Med student/Med Resident etc but could involve care discussion time.  Rush Farmer, M.D. [facility]monary/Critical Care Medicine. Pager: [phone]. After hours pager: [phone].

## 2018-10-07 NOTE — Progress Notes (Signed)
Patient ID: Kathleen [name], female   DOB: 09/01/1950, 68 y.o.   MRN: [id number]    Advanced Heart Failure Rounding Note   Subjective:     Remains intubated. Awake on vent. Following commands,   NE now up to 23. Remains on CVVHD. Anuric. CVP 12   Objective:   Weight Range:  Vital Signs:   Temp:  [97.3 F (36.3 C)-98.2 F (36.8 C)] 98.2 F (36.8 C) (06/12 1148) Pulse Rate:  [43-52] 49 (06/12 1228) Resp:  [12-24] 23 (06/12 1228) BP: (76-121)/(32-101) 117/34 (06/12 1228) SpO2:  [79 %-100 %] 94 % (06/12 1228) Arterial Line BP: (99-105)/(27-32) 100/30 (06/12 1200) FiO2 (%):  [60 %-80 %] 60 % (06/12 1228) Weight:  [105 kg] 105 kg (06/12 0455) Last BM Date: 10/04/18  Weight change: Filed Weights   10/05/18 0500 10/06/18 0500 10/07/18 0455  Weight: 106.6 kg 108.2 kg 105 kg    Intake/Output:   Intake/Output Summary (Last 24 hours) at 10/07/2018 1328 Last data filed at 10/07/2018 1200 Gross per 24 hour  Intake 2211.5 ml  Output 2253 ml  Net -41.5 ml     Physical Exam: General:  Intubated. Sedated but follows commands HEENT: normal + ETT Neck: supple. JVP up  Carotids 2+ bilat; no bruits. No lymphadenopathy or thryomegaly appreciated. Cor: PMI nondisplaced. Brady irregular  R chest perm cath Lungs: clear Abdomen: obese soft, nontender, nondistended. No hepatosplenomegaly. No bruits or masses. Good bowel sounds. Extremities: no cyanosis, clubbing, rash, 1+ edema + boots Neuro: awake on vent. Follow commands   Telemetry: Slow AFL 40-50s. Personally reviewed   Labs: Basic Metabolic Panel: Recent Labs  Lab 10/02/18 0411  10/05/18 0314 10/05/18 1552 10/06/18 0300  10/06/18 0556 10/06/18 0854 10/06/18 1125 10/06/18 1600 10/07/18 0310  NA 139   < > 135 135 133*   < > 136 136 136 134* 136   137  K 4.4   < > 4.3 4.0 5.5*   < > 5.5* 5.6* 5.5* 5.0 4.6   4.6  CL 102   < > 102 102 100  --   --   --   --  102 105   106  CO2 27   < > 25 23 23   --   --   --   --  23 21*    23  GLUCOSE 215*   < > 296* 304* 313*  --   --   --   --  306* 227*   238*  BUN 40*   < > 33* 29* 47*  --   --   --   --  46* 34*   36*  CREATININE 2.12*   < > 1.78* 1.85* 2.78*  --   --   --   --  2.75* 2.05*   1.99*  CALCIUM 8.3*   < > 7.7* 7.2* 8.0*  --   --   --   --  7.8* 7.4*   7.4*  MG 2.6*  --   --   --   --   --   --   --   --   --  2.4  PHOS 2.9   < > 1.7* 1.4* 1.3*  --   --   --   --  2.6 2.1*   2.1*   < > = values in this interval not displayed.    Liver Function Tests: Recent Labs  Lab 10/05/18 0314 10/05/18 1552 10/06/18 0300 10/06/18 1600 10/07/18 0310  ALBUMIN  2.0* 1.8* 1.9* 1.9* 1.8*   1.7*   No results for input(s): LIPASE, AMYLASE in the last 168 hours. No results for input(s): AMMONIA in the last 168 hours.  CBC: Recent Labs  Lab 10/02/18 0411 10/03/18 0409 10/04/18 0410 10/05/18 0314 10/06/18 0300 10/06/18 0353 10/06/18 0556 10/06/18 0854 10/06/18 1125 10/07/18 0310  WBC 5.0 4.6 5.4 6.8 9.0  --   --   --   --  23.2*  NEUTROABS 2.6  --   --   --   --   --   --   --   --   --   HGB 10.8* 10.3* 10.0* 9.9* 9.7* 11.2* 11.6* 11.6* 11.2* 9.4*  HCT 37.4 35.4* 34.3* 34.3* 33.9* 33.0* 34.0* 34.0* 33.0* 32.8*  MCV 84.8 85.5 86.4 86.8 86.9  --   --   --   --  87.5  PLT 78* 73* 85* 76* 134*  --   --   --   --  129*    Cardiac Enzymes: No results for input(s): CKTOTAL, CKMB, CKMBINDEX, TROPONINI in the last 168 hours.  BNP: BNP (last 3 results) Recent Labs    09/22/18 1420  BNP 451.2*    ProBNP (last 3 results) No results for input(s): PROBNP in the last 8760 hours.    Other results:  Imaging: Dg Chest Port 1 View  Result Date: 10/07/2018 CLINICAL DATA:  Respiratory failure EXAM: PORTABLE CHEST 1 VIEW COMPARISON:  10/06/2018 FINDINGS: Cardiac shadow remains enlarged. Endotracheal tube, feeding catheter and dialysis catheter are again seen and stable. Left PICC line is noted as well. Patchy bilateral infiltrates are noted slightly improved in the  right upper lobe but overall increased in the left lung when compared with the prior exam. No sizable effusion is noted. IMPRESSION: Waxing and waning infiltrates bilaterally slightly improved on the right but worsened on the left. Tubes and lines as described above. Electronically Signed   By: Inez Catalina M.D.   On: 10/07/2018 07:26   Dg Chest Port 1 View  Result Date: 10/06/2018 CLINICAL DATA:  Endotracheal tube adjustment. EXAM: PORTABLE CHEST 1 VIEW COMPARISON:  10/06/2018 FINDINGS: Endotracheal tube has been pulled back and has tip 2.8 cm above the carina. Enteric tube courses into the region of the stomach and off the film as tip is not visualized. Left-sided PICC line and right IJ central venous catheter unchanged. Lungs are adequately inflated demonstrate stable airspace consolidation over the right upper lobe with mild patchy left perihilar and right basilar opacification. No effusion. No pneumothorax. Mild stable cardiomegaly. Remainder of the exam is unchanged. IMPRESSION: Multifocal airspace process worse over the right upper lobe without significant change likely multifocal pneumonia. Tubes and lines as described. Endotracheal tube has been pulled back with tip 2.8 cm above the carina. Electronically Signed   By: Marin Olp M.D.   On: 10/06/2018 08:44   Portable Chest X-ray  Result Date: 10/06/2018 CLINICAL DATA:  Endotracheal tube placement.  Respiratory failure. EXAM: PORTABLE CHEST 1 VIEW COMPARISON:  One-view chest x-ray 10/06/2018 at 3:40 a.m. FINDINGS: The patient has now been intubated. The endotracheal tube terminates at the level the carina, directed towards the right mainstem bronchus. Progressive right upper lobe airspace consolidation is present. Aeration at the right base and left lung is improved. Small bore feeding tube extends off the inferior border the film. Right IJ line is stable. IMPRESSION: 1. Endotracheal tube terminates at the level the carina and could be pulled back  approximately 3 cm  for more optimal positioning. 2. Increased right upper lobe airspace consolidation. 3. Improved aeration of the left lung and right lower lobe. These results were called by telephone at the time of interpretation on 10/06/2018 at 7:50am to Dr. Nelda Marseille, who verbally acknowledged these results. Electronically Signed   By: San Morelle M.D.   On: 10/06/2018 08:07   Dg Chest Port 1 View  Result Date: 10/06/2018 CLINICAL DATA:  Fever and hypoxia. EXAM: PORTABLE CHEST 1 VIEW COMPARISON:  One-view chest x-ray 10/02/2018 FINDINGS: The heart is enlarged. New right IJ catheter is in place. Feeding tube courses off the inferior border the film. Left-sided PICC line terminates near the azygos. There is increase in a diffuse interstitial and airspace pattern. No significant effusions are present. IMPRESSION: 1. Increasing diffuse interstitial and airspace disease consistent with edema or ARDS. Infection is not excluded. 2. New right IJ catheter without pneumothorax. Electronically Signed   By: San Morelle M.D.   On: 10/06/2018 04:56     Medications:     Scheduled Medications:  atorvastatin  40 mg Oral q1800   chlorhexidine gluconate (MEDLINE KIT)  15 mL Mouth Rinse BID   Chlorhexidine Gluconate Cloth  6 each Topical Daily   feeding supplement (PRO-STAT SUGAR FREE 64)  30 mL Per Tube QID   feeding supplement (VITAL 1.5 CAL)  1,000 mL Per Tube Q24H   hydrocortisone sodium succinate  50 mg Intravenous Q6H   insulin aspart  0-20 Units Subcutaneous Q4H   insulin aspart  4 Units Subcutaneous Q4H   insulin glargine  10 Units Subcutaneous Daily   mouth rinse  15 mL Mouth Rinse 10 times per day   midodrine  10 mg Oral TID WC    Infusions:   prismasol BGK 4/2.5 500 mL/hr at 10/07/18 0645    prismasol BGK 4/2.5 300 mL/hr at 10/07/18 0326   sodium chloride     sodium chloride Stopped (10/06/18 1509)   ceFEPime (MAXIPIME) IV 2 g (10/07/18 1213)   famotidine  (PEPCID) IV Stopped (10/06/18 1453)   fentaNYL infusion INTRAVENOUS 50 mcg/hr (10/07/18 1200)   heparin 900 Units/hr (10/07/18 1200)   norepinephrine (LEVOPHED) Adult infusion 15 mcg/min (10/07/18 1200)   potassium PHOSPHATE IVPB (in mmol) 42 mL/hr at 10/07/18 1200   prismasol BGK 4/2.5 1,500 mL/hr at 10/07/18 1026   sodium chloride 999 mL/hr at 10/06/18 1018   vancomycin 200 mL/hr at 10/07/18 1200    PRN Medications: Place/Maintain arterial line **AND** sodium chloride, sodium chloride, acetaminophen (TYLENOL) oral liquid 160 mg/5 mL, bisacodyl, docusate, heparin, midazolam, ondansetron, sodium chloride, sodium chloride flush   Assessment/Plan:   1. PEA arrest on 5/30 - due to hypoxia/pulmonary edema. - now stable  2. Acute on chronic hypoxic respiratory failure - has underlying  OHS/OSA  - intubated on 5/30 in setting of arrest.  - treated for MSSA PNA - extubated 6/3.  - reintubated 6/11 with worsening RUL aispace disease.  - abx broadened to cefipime/vanc. Now AF. CCM managing   3.  Acute on chronic diastolic HF - Admitted with markedly elevated filling pressures complicated by severe cardiorenal syndrome - Now down 72 pounds with CVVHD - Anuric. CVP 12 Suspect she may progress to long-term HD - Volume management per Renal - ? Need to w/u for possible TTR amyloid  4. Pulmonary HTN, severe  - this is mostly pulmonary venous HTN by cath [facility] 2) but also has a component of OHS/OSA (WHO group 3) - no role for selective pulmonary  vasodilators - will improve significantly with volume removal - consider repeat RHC after volume removal   5. AKI on CKD 3 - baseline creatinine 1.9 -> ESRD - likely cardiorenal - We have suspected that she may progress to long-term HD. Remains anuric  6. Morbid obesity - desperately needs weight loss  6. Chronic AF/AFL - Off apixaban. Now on heparin. No bleeding.  Discussed dosing with PharmD personally. - Rhythm looks to  be slow AFL currently - no change  (has had some junctional and sinus brady) - No role for PPM currently  7. VT/NSVT - off amio due to bradycardia - Keep K> 4.0 Mg 2.0  8. MSSA PNA - Bcx drawn 6/2 & 6/11. NGTD - sputum cx with MSSA PNA - CXR with worsening RUL PNA. Cefazolin switched to cefipime/vanc  9. Hypotension - ? Due to sepsis - I am not sure cuff pressures are accurate. CCM to place arterial line - Wean NE as tolerated   Have d/w CCM service who will take over primary management.  We will continue to follow at a distance to keep an eye on her HR  CRITICAL CARE Performed by: Glori Bickers  Total critical care time: 35 minutes  Critical care time was exclusive of separately billable procedures and treating other patients.  Critical care was necessary to treat or prevent imminent or life-threatening deterioration.  Critical care was time spent personally by me (independent of midlevel providers or residents) on the following activities: development of treatment plan with patient and/or surrogate as well as nursing, discussions with consultants, evaluation of patient's response to treatment, examination of patient, obtaining history from patient or surrogate, ordering and performing treatments and interventions, ordering and review of laboratory studies, ordering and review of radiographic studies, pulse oximetry and re-evaluation of patient's condition.    Length of Stay: 15  Glori Bickers MD 10/07/2018, 1:28 PM  Advanced Heart Failure Team Pager [phone] (M-F; [place])  Please contact [facility] for night-coverage after hours (4p -7a ) and weekends on [email or web address]

## 2018-10-07 NOTE — Progress Notes (Signed)
Renal Navigator continues to follow for appropriateness in initiating OP HD referral. Renal Navigator notes that patient remains intubated and currently on CRRT. OP HD referral has not yet been initiated and Renal Navigator will continue to follow.  Alphonzo Cruise, [place] Renal Navigator [phone]

## 2018-10-07 NOTE — Progress Notes (Signed)
OT Cancellation Note  Patient Details Name: Kathleen [name] MRN: [id number] DOB: [birth year]   Cancelled Treatment:    Reason Eval/Treat Not Completed: Medical issues which prohibited therapy(Pt intubated 10/06/18. Will follow.)  [name] 10/07/2018, 2:47 PM  [name], OTR/L Acute Rehabilitation Services Pager: [phone] Office: [phone]

## 2018-10-08 ENCOUNTER — Inpatient Hospital Stay (HOSPITAL_COMMUNITY): Payer: Medicare HMO

## 2018-10-08 LAB — RENAL FUNCTION PANEL
Albumin: 1.8 g/dL — ABNORMAL LOW (ref 3.5–5.0)
Albumin: 1.9 g/dL — ABNORMAL LOW (ref 3.5–5.0)
Anion gap: 9 (ref 5–15)
Anion gap: 8 (ref 5–15)
BUN: 33 mg/dL — ABNORMAL HIGH (ref 8–23)
BUN: 36 mg/dL — ABNORMAL HIGH (ref 8–23)
CO2: 23 mmol/L (ref 22–32)
CO2: 24 mmol/L (ref 22–32)
Calcium: 8 mg/dL — ABNORMAL LOW (ref 8.9–10.3)
Calcium: 8 mg/dL — ABNORMAL LOW (ref 8.9–10.3)
Chloride: 100 mmol/L (ref 98–111)
Chloride: 99 mmol/L (ref 98–111)
Creatinine, Ser: 1.73 mg/dL — ABNORMAL HIGH (ref 0.44–1.00)
Creatinine, Ser: 1.81 mg/dL — ABNORMAL HIGH (ref 0.44–1.00)
GFR calc Af Amer: 35 mL/min — ABNORMAL LOW (ref >60)
GFR calc Af Amer: 33 mL/min — ABNORMAL LOW (ref >60)
GFR calc non Af Amer: 30 mL/min — ABNORMAL LOW (ref >60)
GFR calc non Af Amer: 29 mL/min — ABNORMAL LOW (ref >60)
Glucose, Bld: 327 mg/dL — ABNORMAL HIGH (ref 70–99)
Glucose, Bld: 350 mg/dL — ABNORMAL HIGH (ref 70–99)
Phosphorus: 2.2 mg/dL — ABNORMAL LOW (ref 2.5–4.6)
Phosphorus: 2.1 mg/dL — ABNORMAL LOW (ref 2.5–4.6)
Potassium: 5.7 mmol/L — ABNORMAL HIGH (ref 3.5–5.1)
Potassium: 5 mmol/L (ref 3.5–5.1)
Sodium: 132 mmol/L — ABNORMAL LOW (ref 135–145)
Sodium: 131 mmol/L — ABNORMAL LOW (ref 135–145)

## 2018-10-08 LAB — GLUCOSE, CAPILLARY
Glucose-Capillary: 286 mg/dL — ABNORMAL HIGH (ref 70–99)
Glucose-Capillary: 291 mg/dL — ABNORMAL HIGH (ref 70–99)
Glucose-Capillary: 299 mg/dL — ABNORMAL HIGH (ref 70–99)
Glucose-Capillary: 306 mg/dL — ABNORMAL HIGH (ref 70–99)
Glucose-Capillary: 312 mg/dL — ABNORMAL HIGH (ref 70–99)
Glucose-Capillary: 313 mg/dL — ABNORMAL HIGH (ref 70–99)

## 2018-10-08 LAB — CULTURE, RESPIRATORY W GRAM STAIN

## 2018-10-08 LAB — CBC
HCT: 30.4 % — ABNORMAL LOW (ref 36.0–46.0)
Hemoglobin: 8.9 g/dL — ABNORMAL LOW (ref 12.0–15.0)
MCH: 25.9 pg — ABNORMAL LOW (ref 26.0–34.0)
MCHC: 29.3 g/dL — ABNORMAL LOW (ref 30.0–36.0)
MCV: 88.4 fL (ref 80.0–100.0)
Platelets: 85 10*3/uL — ABNORMAL LOW (ref 150–400)
RBC: 3.44 MIL/uL — ABNORMAL LOW (ref 3.87–5.11)
RDW: 21.4 % — ABNORMAL HIGH (ref 11.5–15.5)
WBC: 13.9 10*3/uL — ABNORMAL HIGH (ref 4.0–10.5)
nRBC: 0.9 % — ABNORMAL HIGH (ref 0.0–0.2)

## 2018-10-08 LAB — MAGNESIUM: Magnesium: 2.8 mg/dL — ABNORMAL HIGH (ref 1.7–2.4)

## 2018-10-08 LAB — HEPARIN LEVEL (UNFRACTIONATED): Heparin Unfractionated: 0.26 IU/mL — ABNORMAL LOW (ref 0.30–0.70)

## 2018-10-08 MED ORDER — PRISMASOL BGK 0/2.5 32-2.5 MEQ/L IV SOLN
INTRAVENOUS | Status: DC
  Administered 2018-10-08: 08:00:00 via INTRAVENOUS_CENTRAL
  Filled 2018-10-08: qty 5000

## 2018-10-08 MED ORDER — PRISMASOL BGK 0/2.5 32-2.5 MEQ/L IV SOLN
INTRAVENOUS | Status: DC
  Administered 2018-10-08: 08:00:00 via INTRAVENOUS_CENTRAL
  Filled 2018-10-08 (×2): qty 5000

## 2018-10-08 MED ORDER — PRISMASOL BGK 0/2.5 32-2.5 MEQ/L IV SOLN
INTRAVENOUS | Status: DC
  Administered 2018-10-08: 08:00:00 via INTRAVENOUS_CENTRAL
  Filled 2018-10-08 (×5): qty 5000

## 2018-10-08 MED ORDER — PRISMASOL BGK 0/2.5 32-2.5 MEQ/L IV SOLN
INTRAVENOUS | Status: DC
  Administered 2018-10-08 – 2018-10-09 (×5): via INTRAVENOUS_CENTRAL
  Filled 2018-10-08 (×8): qty 5000

## 2018-10-08 MED ORDER — PRISMASOL BGK 4/2.5 32-4-2.5 MEQ/L REPLACEMENT SOLN
Status: DC
  Administered 2018-10-08 – 2018-10-10 (×4): via INTRAVENOUS_CENTRAL
  Administered 2018-10-10: 500 mL via INTRAVENOUS_CENTRAL
  Administered 2018-10-10 – 2018-10-19 (×17): via INTRAVENOUS_CENTRAL
  Filled 2018-10-08 (×30): qty 5000

## 2018-10-08 MED ORDER — PRISMASOL BGK 4/2.5 32-4-2.5 MEQ/L REPLACEMENT SOLN
Status: DC
  Administered 2018-10-08: 11:00:00 via INTRAVENOUS_CENTRAL
  Administered 2018-10-09: 300 mL via INTRAVENOUS_CENTRAL
  Administered 2018-10-10 – 2018-10-20 (×14): via INTRAVENOUS_CENTRAL
  Filled 2018-10-08 (×23): qty 5000

## 2018-10-08 MED ORDER — QUETIAPINE FUMARATE 25 MG PO TABS
25.0000 mg | ORAL_TABLET | Freq: Two times a day (BID) | ORAL | Status: DC
  Administered 2018-10-08 (×2): 25 mg via ORAL
  Filled 2018-10-08 (×3): qty 1

## 2018-10-08 MED ORDER — CLONAZEPAM 0.5 MG PO TBDP
1.0000 mg | ORAL_TABLET | Freq: Two times a day (BID) | ORAL | Status: DC
  Administered 2018-10-08 – 2018-10-16 (×17): 1 mg
  Filled 2018-10-08 (×18): qty 2

## 2018-10-08 NOTE — Progress Notes (Signed)
Assisted tele visit to patient with family member.  Kelbie Moro Ann, RN  

## 2018-10-08 NOTE — Progress Notes (Signed)
Kathleen [name] for Heparin Indication: atrial fibrillation  Allergies  Allergen Reactions  . Doxycycline     REACTION: Wheals and pruritus    Patient Measurements: Height: 5' (152.4 cm) Weight: 230 lb 2.6 oz (104.4 kg) IBW/kg (Calculated) : 45.5  Vital Signs: Temp: 98.3 F (36.8 C) (06/13 1100) Temp Source: Axillary (06/13 1100) BP: 100/33 (06/13 1147) Pulse Rate: 42 (06/13 1147)  Labs: Recent Labs    10/06/18 0300  10/06/18 1125  10/07/18 0310 10/07/18 1543 10/08/18 0403  HGB 9.7*   < > 11.2*  --  9.4*  --  8.9*  HCT 33.9*   < > 33.0*  --  32.8*  --  30.4*  PLT 134*  --   --   --  129*  --  85*  HEPARINUNFRC 0.33  --   --   --  0.38  --  0.26*  CREATININE 2.78*  --   --    < > 2.05*  1.99* 1.76* 1.73*   < > = values in this interval not displayed.    Estimated Creatinine Clearance: 34.9 mL/min (A) (by C-G formula based on SCr of 1.73 mg/dL (H)).  Assessment: Pt is a 67yoF with a history of afib. She was on Eliquis PTA, received in hospital, last dose 5/29. Apixaban on hold for potential need for [facility], currently on CRRT. Pharmacy consulted to dose heparin  Heparin level 0.26 this am - slightly < goal  on heparin 900 units/hr. Hgb remains low but relatively stable. No signs/symptoms of bleeding or issues with infusion. Significant bruising noted on arms and thighs.    Goal of Therapy:  Heparin level 0.3-0.7 units/ml  Monitor platelets by anticoagulation protocol: Yes   Plan:  Increase heparin  1000 units/hr -Heparin level and CBC daily -Follow plans to transition to [place] once extubated   Bonnita Nasuti Pharm.D. CPP, BCPS Clinical Pharmacist [phone] 10/08/2018 12:04 PM

## 2018-10-08 NOTE — Progress Notes (Signed)
[facility] Progress Note    Assessment/ Plan:   68 y.o.yo femaleHTN, Afib, DM, morbid obesity, diastolic heart failure and also baseline CKD- crt mid to high 1's who was admitted on 5/28/2020with volume overload, then suffered cardiac arrest   1. Renal- A on CRF after cardiac arrest on 5/30. Baseline crt in the mid to high 1's. Required initiation of CRRT early on 5/30- overall negative fluid balance on CRRT- 21.8liters(w/. had beenrunning evenprior to CRRT being stopped 6/10 (5/30-6/10).  Hopeful for eventual recovery but no signs yet  Appreciateconversion to TC by VIR on 6/8(on heparin gtt). RestartedCRRTon 6/11  Titrate  Levophed based on systolic and able to get down to 55mg -> tolerating 510mnet UF.  Seen on CRRT 4k/2.5 RIJ TC 500/300/1500/50  Changed to 2/2.5 for pre/post/dialysate Will recheck BMET later today; shouldn't have to go down to 0K.  A little unusual to have to change baths at this point; no signs of bleeding or K replacement.  2. HTN/volume- ToleratedUF so far- CVP20 but thru PICC line (previously in 4-5 rang). 3. Anemia- hgb close to 10 - no meds yet 4. PNA? 5. Elytes.- replete Phos (1037m ordered 6/11 733a)    Subjective:  On Levo 6 and tolerating UF (75m67m) No events overnight   Objective:   BP (!) 97/30   Pulse (!) 49   Temp (!) 97.5 F (36.4 C) (Axillary)   Resp 19   Ht 5' (1.524 m)   Wt 104.4 kg   SpO2 98%   BMI 44.95 kg/m   Intake/Output Summary (Last 24 hours) at 10/08/2018 [id number] data filed at 10/08/2018 0700 Gross per 24 hour  Intake 2402.56 ml  Output 2871 ml  Net -468.44 ml   Weight change: -0.6 kg  Physical Exam: General:Intubated Heart: bradyovernight Lungs: CBS bilat Abdomen: obese, non tender Extremities:edema better but still present, tender Dialysis Access: right IJ vascath placed 5/30-> converted to RIJ TC 6/8   Imaging: Dg Chest Port 1 View  Result  Date: 10/07/2018 CLINICAL DATA:  Respiratory failure EXAM: PORTABLE CHEST 1 VIEW COMPARISON:  10/06/2018 FINDINGS: Cardiac shadow remains enlarged. Endotracheal tube, feeding catheter and dialysis catheter are again seen and stable. Left PICC line is noted as well. Patchy bilateral infiltrates are noted slightly improved in the right upper lobe but overall increased in the left lung when compared with the prior exam. No sizable effusion is noted. IMPRESSION: Waxing and waning infiltrates bilaterally slightly improved on the right but worsened on the left. Tubes and lines as described above. Electronically Signed   By: MarkInez Catalina.   On: 10/07/2018 07:26   Dg Chest Port 1 View  Result Date: 10/06/2018 CLINICAL DATA:  Endotracheal tube adjustment. EXAM: PORTABLE CHEST 1 VIEW COMPARISON:  10/06/2018 FINDINGS: Endotracheal tube has been pulled back and has tip 2.8 cm above the carina. Enteric tube courses into the region of the stomach and off the film as tip is not visualized. Left-sided PICC line and right IJ central venous catheter unchanged. Lungs are adequately inflated demonstrate stable airspace consolidation over the right upper lobe with mild patchy left perihilar and right basilar opacification. No effusion. No pneumothorax. Mild stable cardiomegaly. Remainder of the exam is unchanged. IMPRESSION: Multifocal airspace process worse over the right upper lobe without significant change likely multifocal pneumonia. Tubes and lines as described. Endotracheal tube has been pulled back with tip 2.8 cm above the carina. Electronically Signed   By: DaniMarin Olp.   On:  10/06/2018 08:44    Labs: BMET Recent Labs  Lab 10/05/18 0314 10/05/18 1552 10/06/18 0300  10/06/18 0556 10/06/18 0854 10/06/18 1125 10/06/18 1600 10/07/18 0310 10/07/18 1543 10/08/18 0403  NA 135 135 133*   < > 136 136 136 134* 136  137 134* 132*  K 4.3 4.0 5.5*   < > 5.5* 5.6* 5.5* 5.0 4.6  4.6 5.3* 5.7*  CL 102 102  100  --   --   --   --  102 105  106 103 100  CO2 25 23 23   --   --   --   --  23 21*  23 25 23   GLUCOSE 296* 304* 313*  --   --   --   --  306* 227*  238* 258* 327*  BUN 33* 29* 47*  --   --   --   --  46* 34*  36* 31* 33*  CREATININE 1.78* 1.85* 2.78*  --   --   --   --  2.75* 2.05*  1.99* 1.76* 1.73*  CALCIUM 7.7* 7.2* 8.0*  --   --   --   --  7.8* 7.4*  7.4* 8.0* 8.0*  PHOS 1.7* 1.4* 1.3*  --   --   --   --  2.6 2.1*  2.1* 2.4* 2.2*   < > = values in this interval not displayed.   CBC Recent Labs  Lab 10/02/18 0411  10/05/18 0314 10/06/18 0300  10/06/18 0854 10/06/18 1125 10/07/18 0310 10/08/18 0403  WBC 5.0   < > 6.8 9.0  --   --   --  23.2* 13.9*  NEUTROABS 2.6  --   --   --   --   --   --   --   --   HGB 10.8*   < > 9.9* 9.7*   < > 11.6* 11.2* 9.4* 8.9*  HCT 37.4   < > 34.3* 33.9*   < > 34.0* 33.0* 32.8* 30.4*  MCV 84.8   < > 86.8 86.9  --   --   --  87.5 88.4  PLT 78*   < > 76* 134*  --   --   --  129* 85*   < > = values in this interval not displayed.    Medications:    . atorvastatin  40 mg Oral q1800  . chlorhexidine gluconate (MEDLINE KIT)  15 mL Mouth Rinse BID  . Chlorhexidine Gluconate Cloth  6 each Topical Daily  . feeding supplement (PRO-STAT SUGAR FREE 64)  30 mL Per Tube QID  . feeding supplement (VITAL 1.5 CAL)  1,000 mL Per Tube Q24H  . hydrocortisone sodium succinate  50 mg Intravenous Q6H  . insulin aspart  0-20 Units Subcutaneous Q4H  . insulin aspart  4 Units Subcutaneous Q4H  . insulin glargine  10 Units Subcutaneous Daily  . mouth rinse  15 mL Mouth Rinse 10 times per day  . midodrine  10 mg Oral TID WC      Otelia Santee, MD 10/08/2018, 7:09 AM

## 2018-10-08 NOTE — Progress Notes (Signed)
NAME:  Kathleen [name], MRN:  [id number], DOB:  [birth year], LOS: 28 ADMISSION DATE:  09/22/2018, CONSULTATION DATE: 09/24/2018 REFERRING MD: Candee Furbish MD, CHIEF COMPLAINT: Cardiac arrest  Brief History   68 y/o obese woman with chronic diastolic heart failure who failed outpatient diuretics management due to chronic kidney disease.  Right heart cath on 5/29 showed markedly elevated biventricular filling pressures with normal cardiac output.  She was on cardiology service, maintained on milrinone and Lasix.  On the morning of 5/30 she went into PEA arrest, CPR performed for 8 minutes.  Intubated and transferred to ICU.  Started on CRRT 5/31.  Extubated 6/3.  Re-intubated early am 6/11 for hypercarbic respiratory failure, possible RUL PNA.  Past Medical History   has a past medical history of Atrial flutter ([place]), Bradycardia, Chronic diastolic (congestive) heart failure ([place]) (01/10/2015), CKD (chronic kidney disease), stage IV ([place]), Degenerative joint disease of hand, Diabetes mellitus, Dyslipidemia, Fecal occult blood test positive, GERD (gastroesophageal reflux disease), Headache, Hypertension, Inadequate material resources, Irritable bowel syndrome, Morbid obesity ([place]), Obesity hypoventilation syndrome ([place]), Post-menopausal bleeding, and Shortness of breath dyspnea.  Significant Hospital Events   5/28 Admit 5/29 RHC 5/30 PEA arrest, intubated/ CRRT 5/31 Milrinone stopped due to ectopy; brady episode- amio stopped 6/02 Attempt at SBT, chest x-ray with worsening pulmonary edema, CVVHD for volume removal 6/03 Extubated > remain on CRRT.  Refused nocturnal BiPAP 6/04 Refused nocturnal BiPAP 6/11 Intubated early am with hypercarbia, concern for RUL PNA  Consults:  Cardiology PCCM  Nephrology   Procedures:  Lt PICC 5/29 >> OETT 5/30 >> 6/3 R IJ HD cath 5/30 >> Aline left ulnar 5/30 >> 6/2 ETT 6/11 >>   Significant Diagnostic Tests:  TTE 5/28 >>The left ventricle has normal systolic  function with an ejection fraction of 60-65%. The cavity size was normal. Left ventricular diastolic doppler parameters are indeterminate. No evidence of left ventricular regional wall motion abnormalities. The right ventricle has normal systolic function. The cavity was mildly enlarged. There is no increase in right ventricular wall thickness. Right atrial size was mildly dilated. The aortic valve is tricuspid. Mild thickening of the aortic valve. Mild calcification of the aortic valve. Aortic valve regurgitation is trivial by color flow Doppler.The aortic root is normal in size and structure.  Right heart cath 5/29 RA = 24 RV = 94/26 PA = 95/36 (53) PCW = 30 (v = 50) Fick cardiac output/index = 6.0/2.7 PVR =3.9 WU FA sat = 91% PA sat = 58%, 58% SVC 60%  Micro Data:  SARS coronavirus 2 cepheid 5/28 >> neg MRSA PCR 5/28 >> neg Trach aspirate 6/2 >> MSSA BCx2 6/2 >> negative Tracheal aspirate 6/11 >> few GNR >> BCx2 6/11 >>  Antimicrobials:  Vancomycin 6/3 > 6/4 Cefazolin 6/4 >> 6/10 Vanco 6/11 >>  Cefepime 6/11 >>  Interim history/subjective:  No events overnight Intermittent agitation  Objective   Blood pressure (!) 108/36, pulse (!) 47, temperature 98.8 F (37.1 C), temperature source Axillary, resp. rate (!) 25, height 5' (1.524 m), weight 104.4 kg, SpO2 98 %. CVP:  [11 mmHg-15 mmHg] 11 mmHg  Vent Mode: PRVC FiO2 (%):  [50 %-60 %] 50 % Set Rate:  [18 bmp] 18 bmp Vt Set:  [420 mL] 420 mL PEEP:  [5 cmH20] 5 cmH20 Plateau Pressure:  [19 cmH20-24 cmH20] 19 cmH20   Intake/Output Summary (Last 24 hours) at 10/08/2018 0904 Last data filed at 10/08/2018 0800 Gross per 24 hour  Intake 2286.43 ml  Output 2767 ml  Net -480.57 ml   Filed Weights   10/06/18 0500 10/07/18 0455 10/08/18 0300  Weight: 108.2 kg 105 kg 104.4 kg    Examination: General: Acutely ill appearing female, appears very scared HEENT: /AT, PERRL, EOM-I and MMM, ETT in place Neuro: Awake and making eye  contact, moving all ext to command, appears mortified CV: RRR, Nl S12/S2 and -M/R/G PULM: Coarse BS diffusely GI: Soft, NT, ND and +BS Extremities: cool/dry, no edema, BLE wrapped  Skin: no rashes or lesions  I reviewed CXR myself, mild edema, ETT in place  Resolved Hospital Problem list     Assessment & Plan:   PEA Cardiac Arrest -suspect hypoxia driven Acute on Chronic Diastolic Heart Failure -admit with elevated filling pressures, cardiorenal syndrome  Severe Pulmonary Hypertension -s/p RHC, WHO class 2,3 Permanent AF/AFL -on anticoagulation pre admit P: ICU monitoring  Cortisol 23, will add stress dose steroids Levophed for SBP >90 Midodrine 10 mg TID  CRRT at -50 ml/hr  Acute on Chronic Hypoxemic / Hypercarbic Respiratory Failure  -extubated 6/3, refused bipap, reintubated 6/11 for AMS, hypercarbia  -multifactorial, initially volume overload, tracheal aspirate + for MSSA, new RUL infiltrate 6/11  OSA / OHS  -not compliant with BiPAP while inpatient  P: PRVC 8cc/kg  Begin PS trials if patient will tolerate, requiring high pressure support when bedside Wean PEEP / FiO2 for sats >90% Follow intermittent CXR Daily SBT / WUA as able   MSSA PNA / RUL Infiltrate / Possible Aspiration  P: Broad spectrum ABX as above, continue for now given shock state Follow CXR intermittently    VT/ NSVT  -off amio due to bradycardia P: Per Cardiology  Goal K >4, Mg >2   AKI on CKD  -component of cardiorenal disease  Hyponatremia Hypophosphatemia  Mild Hyperkalemia  P: Appreciate Nephrology input Concerned she will not have renal recovery.  Question if she would be a long term HD candidate Trend BMP / urinary output Replace electrolytes as indicated Avoid nephrotoxic agents, ensure adequate renal perfusion CRRT at -50 ml/hr  DM II P: SSI, resistant scale  Increase lantus to 10 units QD TF coverage 4 units Q4  Morbid Obesity  At Risk Malnutrition  P: TF per  Nutrition   Encephalopathy: QT 0.42 Start Seroquel 25 BID Start Clonazepam 1 BID Fentanyl drip at 100 mcg/hr  Best practice:  Diet: NPO,  TF  Pain/Anxiety/Delirium protocol (if indicated):  In place  VAP protocol (if indicated): In place  DVT prophylaxis: Heparin gtt GI prophylaxis: Pepcid Glucose control: SSI Mobility: Bed Code Status: Full Family Communication: Per primary Disposition: ICU (CRRT)  The patient is critically ill with multiple organ systems failure and requires high complexity decision making for assessment and support, frequent evaluation and titration of therapies, application of advanced monitoring technologies and extensive interpretation of multiple databases.   Critical Care Time devoted to patient care services described in this note is  33  Minutes. This time reflects time of care of this signee Dr Jennet Maduro. This critical care time does not reflect procedure time, or teaching time or supervisory time of PA/NP/Med student/Med Resident etc but could involve care discussion time.  Rush Farmer, M.D. [facility] Pulmonary/Critical Care Medicine. Pager: [phone]. After hours pager: [phone].

## 2018-10-09 ENCOUNTER — Inpatient Hospital Stay (HOSPITAL_COMMUNITY): Payer: Medicare HMO

## 2018-10-09 LAB — RENAL FUNCTION PANEL
Albumin: 2 g/dL — ABNORMAL LOW (ref 3.5–5.0)
Albumin: 1.9 g/dL — ABNORMAL LOW (ref 3.5–5.0)
Anion gap: 11 (ref 5–15)
Anion gap: 8 (ref 5–15)
BUN: 39 mg/dL — ABNORMAL HIGH (ref 8–23)
BUN: 41 mg/dL — ABNORMAL HIGH (ref 8–23)
CO2: 24 mmol/L (ref 22–32)
CO2: 24 mmol/L (ref 22–32)
Calcium: 8.3 mg/dL — ABNORMAL LOW (ref 8.9–10.3)
Calcium: 8.1 mg/dL — ABNORMAL LOW (ref 8.9–10.3)
Chloride: 99 mmol/L (ref 98–111)
Chloride: 100 mmol/L (ref 98–111)
Creatinine, Ser: 1.96 mg/dL — ABNORMAL HIGH (ref 0.44–1.00)
Creatinine, Ser: 1.91 mg/dL — ABNORMAL HIGH (ref 0.44–1.00)
GFR calc Af Amer: 30 mL/min — ABNORMAL LOW (ref >60)
GFR calc Af Amer: 31 mL/min — ABNORMAL LOW (ref >60)
GFR calc non Af Amer: 26 mL/min — ABNORMAL LOW (ref >60)
GFR calc non Af Amer: 27 mL/min — ABNORMAL LOW (ref >60)
Glucose, Bld: 343 mg/dL — ABNORMAL HIGH (ref 70–99)
Glucose, Bld: 363 mg/dL — ABNORMAL HIGH (ref 70–99)
Phosphorus: 2.1 mg/dL — ABNORMAL LOW (ref 2.5–4.6)
Phosphorus: 2.3 mg/dL — ABNORMAL LOW (ref 2.5–4.6)
Potassium: 4.3 mmol/L (ref 3.5–5.1)
Potassium: 4 mmol/L (ref 3.5–5.1)
Sodium: 134 mmol/L — ABNORMAL LOW (ref 135–145)
Sodium: 132 mmol/L — ABNORMAL LOW (ref 135–145)

## 2018-10-09 LAB — GLUCOSE, CAPILLARY
Glucose-Capillary: 341 mg/dL — ABNORMAL HIGH (ref 70–99)
Glucose-Capillary: 328 mg/dL — ABNORMAL HIGH (ref 70–99)
Glucose-Capillary: 303 mg/dL — ABNORMAL HIGH (ref 70–99)
Glucose-Capillary: 300 mg/dL — ABNORMAL HIGH (ref 70–99)
Glucose-Capillary: 328 mg/dL — ABNORMAL HIGH (ref 70–99)
Glucose-Capillary: 337 mg/dL — ABNORMAL HIGH (ref 70–99)
Glucose-Capillary: 321 mg/dL — ABNORMAL HIGH (ref 70–99)

## 2018-10-09 LAB — POCT I-STAT 7, (LYTES, BLD GAS, ICA,H+H)
Acid-Base Excess: 1 mmol/L (ref 0.0–2.0)
Bicarbonate: 26.5 mmol/L (ref 20.0–28.0)
Calcium, Ion: 1.16 mmol/L (ref 1.15–1.40)
HCT: 32 % — ABNORMAL LOW (ref 36.0–46.0)
Hemoglobin: 10.9 g/dL — ABNORMAL LOW (ref 12.0–15.0)
O2 Saturation: 92 %
Patient temperature: 98.8
Potassium: 4.2 mmol/L (ref 3.5–5.1)
Sodium: 137 mmol/L (ref 135–145)
TCO2: 28 mmol/L (ref 22–32)
pCO2 arterial: 44.9 mmHg (ref 32.0–48.0)
pH, Arterial: 7.379 (ref 7.350–7.450)
pO2, Arterial: 67 mmHg — ABNORMAL LOW (ref 83.0–108.0)

## 2018-10-09 LAB — CBC
HCT: 31.5 % — ABNORMAL LOW (ref 36.0–46.0)
Hemoglobin: 9.2 g/dL — ABNORMAL LOW (ref 12.0–15.0)
MCH: 25.5 pg — ABNORMAL LOW (ref 26.0–34.0)
MCHC: 29.2 g/dL — ABNORMAL LOW (ref 30.0–36.0)
MCV: 87.3 fL (ref 80.0–100.0)
Platelets: 149 10*3/uL — ABNORMAL LOW (ref 150–400)
RBC: 3.61 MIL/uL — ABNORMAL LOW (ref 3.87–5.11)
RDW: 21.2 % — ABNORMAL HIGH (ref 11.5–15.5)
WBC: 14.8 10*3/uL — ABNORMAL HIGH (ref 4.0–10.5)
nRBC: 1.7 % — ABNORMAL HIGH (ref 0.0–0.2)

## 2018-10-09 LAB — HEPARIN LEVEL (UNFRACTIONATED): Heparin Unfractionated: 0.25 IU/mL — ABNORMAL LOW (ref 0.30–0.70)

## 2018-10-09 LAB — MAGNESIUM: Magnesium: 2.8 mg/dL — ABNORMAL HIGH (ref 1.7–2.4)

## 2018-10-09 MED ORDER — DEXMEDETOMIDINE HCL IN NACL 200 MCG/50ML IV SOLN
0.4000 ug/kg/h | INTRAVENOUS | Status: DC
  Administered 2018-10-09: 0.4 ug/kg/h via INTRAVENOUS
  Filled 2018-10-09: qty 50

## 2018-10-09 MED ORDER — QUETIAPINE FUMARATE 50 MG PO TABS
50.0000 mg | ORAL_TABLET | Freq: Two times a day (BID) | ORAL | Status: DC
  Administered 2018-10-09 – 2018-10-17 (×17): 50 mg via ORAL
  Filled 2018-10-09 (×17): qty 1

## 2018-10-09 MED ORDER — PRISMASOL BGK 4/2.5 32-4-2.5 MEQ/L IV SOLN
INTRAVENOUS | Status: DC
  Administered 2018-10-09 (×2): via INTRAVENOUS_CENTRAL
  Administered 2018-10-09: 1500 mL/h via INTRAVENOUS_CENTRAL
  Administered 2018-10-10 (×4): via INTRAVENOUS_CENTRAL
  Administered 2018-10-10: 1500 mL/h via INTRAVENOUS_CENTRAL
  Administered 2018-10-11 – 2018-10-20 (×55): via INTRAVENOUS_CENTRAL
  Filled 2018-10-09 (×93): qty 5000

## 2018-10-09 MED ORDER — INSULIN GLARGINE 100 UNIT/ML ~~LOC~~ SOLN
20.0000 [IU] | Freq: Every day | SUBCUTANEOUS | Status: DC
  Administered 2018-10-09 – 2018-10-10 (×2): 20 [IU] via SUBCUTANEOUS
  Filled 2018-10-09 (×2): qty 0.2

## 2018-10-09 NOTE — Progress Notes (Signed)
[facility] Progress Note    Assessment/ Plan:   68 y.o.yo femaleHTN, Afib, DM, morbid obesity, diastolic heart failure and also baseline CKD- crt mid to high 1's who was admitted on 5/28/2020with volume overload, then suffered cardiac arrest   1. Renal- A on CRF after cardiac arrest on 5/30. Baseline crt in the mid to high 1's. Required initiation of CRRT early on 5/30- overall negative fluid balance on CRRT- 21.8liters(w/. had beenrunning evenprior to CRRT being stopped 6/10 (5/30-6/10).  Hopeful for eventual recovery but no signs yet  Appreciateconversion to TC by VIR on 6/8(on heparin gtt). RestartedCRRTon 6/11  TitrateLevophed based on systolic and able to get down to 35mg -> tolerating 556mnet UF. Ok to incr to 75 as long as Levo req don't incr.  Seen on CRRT  RIJ TC 500/300/1000/50 with 0K dialysate  Will change to 4K for dialysate to conserve 0k.  2. HTN/volume- ToleratedUF so far- CVP20 but thru PICC line (previously in 4-5 rang). 3. Anemia- hgb close to 10 - no meds yet 4. PNA? 5. Elytes.- replete Phos (1023m ordered 6/11 733a)      Subjective:    On Levo 10 and tolerating UF (89m47m) No events overnight   Objective:   BP (!) 108/35   Pulse (!) 46   Temp 98.6 F (37 C) (Axillary)   Resp (!) 26   Ht 5' (1.524 m)   Wt 103 kg   SpO2 98%   BMI 44.35 kg/m   Intake/Output Summary (Last 24 hours) at 10/09/2018 0835 Last data filed at 10/09/2018 0700 Gross per 24 hour  Intake 1778.32 ml  Output 3412 ml  Net -1633.68 ml   Weight change: -1.4 kg  Physical Exam: General:Intubated Heart: brady Lungs: CBS bilat Abdomen: obese, non tender Extremities:edema better but still present, tender Dialysis Access: right IJ vascath placed 5/30-> converted to RIJ TC 6/8  Imaging: Dg Chest Port 1 View  Result Date: 10/09/2018 CLINICAL DATA:  Patient intubated. EXAM: PORTABLE CHEST 1 VIEW COMPARISON:  October 08, 2018 FINDINGS: The ETT is in good position. The feeding tube terminates below today's film. Other support apparatus including a right central line and a left PICC line are stable. No pneumothorax. Diffuse bilateral pulmonary infiltrates remain, similar to slightly improved in the interval. No change in the cardiomediastinal silhouette. IMPRESSION: 1. Support apparatus as above, in good position. 2. Diffuse bilateral pulmonary infiltrates remain, similar to slightly improved in the interval. Electronically Signed   By: DaviDorise Bullion M.D   On: 10/09/2018 08:05   Dg Chest Port 1 View  Result Date: 10/08/2018 CLINICAL DATA:  Acute respiratory failure with hypoxia. EXAM: PORTABLE CHEST 1 VIEW COMPARISON:  10/07/2018 FINDINGS: Endotracheal tube terminates at the inferior margin of the clavicular heads. Right jugular catheter, left PICC, and enteric tube remain in place. The cardiac silhouette remains enlarged. Extensive patchy and confluent airspace opacities bilaterally, left mildly worse than right, are unchanged aside from mild improvement in the right midlung. No large pleural effusion or pneumothorax is identified. IMPRESSION: Extensive bilateral airspace disease, slightly improved on the right. Electronically Signed   By: AlleLogan Bores.   On: 10/08/2018 10:16    Labs: BMET Recent Labs  Lab 10/06/18 0300  10/06/18 1600 10/07/18 0310 10/07/18 1543 10/08/18 0403 10/08/18 1532 10/09/18 0308 10/09/18 0445  NA 133*   < > 134* 136  137 134* 132* 131* 134* 137  K 5.5*   < > 5.0 4.6  4.6  5.3* 5.7* 5.0 4.3 4.2  CL 100  --  102 105  106 103 100 99 99  --   CO2 23  --  23 21*  _0 --   GLUCOSE 313*  --  306* 227*  238* 258* 327* 350* 343*  --   BUN 47*  --  46* 34*  36* 31* 33* 36* 39*  --   CREATININE 2.78*  --  2.75* 2.05*  1.99* 1.76* 1.73* 1.81* 1.96*  --   CALCIUM 8.0*  --  7.8* 7.4*  7.4* 8.0* 8.0* 8.0* 8.3*  --   PHOS 1.3*  --  2.6 2.1*  2.1* 2.4* 2.2* 2.1* 2.1*   --    < > = values in this interval not displayed.   CBC Recent Labs  Lab 10/06/18 0300  10/07/18 0310 10/08/18 0403 10/09/18 0308 10/09/18 0445  WBC 9.0  --  23.2* 13.9* 14.8*  --   HGB 9.7*   < > 9.4* 8.9* 9.2* 10.9*  HCT 33.9*   < > 32.8* 30.4* 31.5* 32.0*  MCV 86.9  --  87.5 88.4 87.3  --   PLT 134*  --  129* 85* 149*  --    < > = values in this interval not displayed.    Medications:    . atorvastatin  40 mg Oral q1800  . chlorhexidine gluconate (MEDLINE KIT)  15 mL Mouth Rinse BID  . Chlorhexidine Gluconate Cloth  6 each Topical Daily  . clonazePAM  1 mg Per Tube BID  . feeding supplement (PRO-STAT SUGAR FREE 64)  30 mL Per Tube QID  . feeding supplement (VITAL 1.5 CAL)  1,000 mL Per Tube Q24H  . hydrocortisone sodium succinate  50 mg Intravenous Q6H  . insulin aspart  0-20 Units Subcutaneous Q4H  . insulin aspart  4 Units Subcutaneous Q4H  . insulin glargine  10 Units Subcutaneous Daily  . mouth rinse  15 mL Mouth Rinse 10 times per day  . midodrine  10 mg Oral TID WC  . QUEtiapine  25 mg Oral BID      Otelia Santee, MD 10/09/2018, 8:35 AM

## 2018-10-09 NOTE — Progress Notes (Signed)
NAME:  Kathleen [name], MRN:  [id number], DOB:  1950/08/14, LOS: 64 ADMISSION DATE:  09/22/2018, CONSULTATION DATE: 09/24/2018 REFERRING MD: Candee Furbish MD, CHIEF COMPLAINT: Cardiac arrest  Brief History   68 y/o obese woman with chronic diastolic heart failure who failed outpatient diuretics management due to chronic kidney disease.  Right heart cath on 5/29 showed markedly elevated biventricular filling pressures with normal cardiac output.  She was on cardiology service, maintained on milrinone and Lasix.  On the morning of 5/30 she went into PEA arrest, CPR performed for 8 minutes.  Intubated and transferred to ICU.  Started on CRRT 5/31.  Extubated 6/3.  Re-intubated early am 6/11 for hypercarbic respiratory failure, possible RUL PNA.  Past Medical History   has a past medical history of Atrial flutter ([place]), Bradycardia, Chronic diastolic (congestive) heart failure ([place]) (01/10/2015), CKD (chronic kidney disease), stage IV ([place]), Degenerative joint disease of hand, Diabetes mellitus, Dyslipidemia, Fecal occult blood test positive, GERD (gastroesophageal reflux disease), Headache, Hypertension, Inadequate material resources, Irritable bowel syndrome, Morbid obesity ([place]), Obesity hypoventilation syndrome ([place]), Post-menopausal bleeding, and Shortness of breath dyspnea.  Significant Hospital Events   5/28 Admit 5/29 RHC 5/30 PEA arrest, intubated/ CRRT 5/31 Milrinone stopped due to ectopy; brady episode- amio stopped 6/02 Attempt at SBT, chest x-ray with worsening pulmonary edema, CVVHD for volume removal 6/03 Extubated > remain on CRRT.  Refused nocturnal BiPAP 6/04 Refused nocturnal BiPAP 6/11 Intubated early am with hypercarbia, concern for RUL PNA  Consults:  Cardiology PCCM  Nephrology   Procedures:  Lt PICC 5/29 >> OETT 5/30 >> 6/3 R IJ HD cath 5/30 >> Aline left ulnar 5/30 >> 6/2 ETT 6/11 >>   Significant Diagnostic Tests:  TTE 5/28 >>The left ventricle has normal systolic  function with an ejection fraction of 60-65%. The cavity size was normal. Left ventricular diastolic doppler parameters are indeterminate. No evidence of left ventricular regional wall motion abnormalities. The right ventricle has normal systolic function. The cavity was mildly enlarged. There is no increase in right ventricular wall thickness. Right atrial size was mildly dilated. The aortic valve is tricuspid. Mild thickening of the aortic valve. Mild calcification of the aortic valve. Aortic valve regurgitation is trivial by color flow Doppler.The aortic root is normal in size and structure.  Right heart cath 5/29 RA = 24 RV = 94/26 PA = 95/36 (53) PCW = 30 (v = 50) Fick cardiac output/index = 6.0/2.7 PVR =3.9 WU FA sat = 91% PA sat = 58%, 58% SVC 60%  Micro Data:  SARS coronavirus 2 cepheid 5/28 >> neg MRSA PCR 5/28 >> neg Trach aspirate 6/2 >> MSSA BCx2 6/2 >> negative Tracheal aspirate 6/11 >> few GNR >> BCx2 6/11 >>  Antimicrobials:  Vancomycin 6/3 > 6/4 Cefazolin 6/4 >> 6/10 Vanco 6/11 >>  Cefepime 6/11 >>  Interim history/subjective:  Severe hyperglycemia overnight No further events  Objective   Blood pressure (!) 108/35, pulse (!) 44, temperature 98.6 F (37 C), temperature source Axillary, resp. rate 18, height 5' (1.524 m), weight 103 kg, SpO2 94 %. CVP:  [8 mmHg-10 mmHg] 8 mmHg  Vent Mode: PRVC FiO2 (%):  [40 %-50 %] 50 % Set Rate:  [18 bmp] 18 bmp Vt Set:  [420 mL] 420 mL PEEP:  [5 cmH20] 5 cmH20 Pressure Support:  [10 cmH20] 10 cmH20 Plateau Pressure:  [16 cmH20-27 cmH20] 21 cmH20   Intake/Output Summary (Last 24 hours) at 10/09/2018 0940 Last data filed at 10/09/2018 0900 Gross per  24 hour  Intake 1864.08 ml  Output 3558 ml  Net -1693.92 ml   Filed Weights   10/07/18 0455 10/08/18 0300 10/09/18 0309  Weight: 105 kg 104.4 kg 103 kg   Examination: General: Acutely ill appearing female HEENT: [place]/AT, PERRL, EOM-I and MMM, ETT in place Neuro: Awake  and making eye contact, moving all ext to command, appears mortified CV: RRR, Nl S1/S2 and -M/R/G PULM: Coarse BS diffusely GI: Soft, NT, ND and +BS Extremities: cool/dry, no edema, BLE wrapped  Skin: no rashes or lesions  I reviewed CXR myself, mild edema, ETT in place  Resolved Hospital Problem list     Assessment & Plan:   PEA Cardiac Arrest -suspect hypoxia driven Acute on Chronic Diastolic Heart Failure -admit with elevated filling pressures, cardiorenal syndrome  Severe Pulmonary Hypertension -s/p RHC, WHO class 2,3 Permanent AF/AFL -on anticoagulation pre admit P: ICU monitoring  Cortisol 23, will continue stress dose steroids while in shock Levophed for SBP >90 Midodrine 10 mg TID  CRRT at -50 to 100 ml/hr  Acute on Chronic Hypoxemic / Hypercarbic Respiratory Failure  -extubated 6/3, refused bipap, reintubated 6/11 for AMS, hypercarbia  -multifactorial, initially volume overload, tracheal aspirate + for MSSA, new RUL infiltrate 6/11  OSA / OHS  -not compliant with BiPAP while inpatient  P: PRVC 8cc/kg  Continue volume negative Maintain on full vent support Wean PEEP / FiO2 for sats >90% Follow intermittent CXR Daily SBT / WUA as able   MSSA PNA / RUL Infiltrate / Possible Aspiration  P: Broad spectrum ABX as above, continue for now given shock state Follow CXR intermittently    VT/ NSVT  -off amio due to bradycardia P: Per Cardiology  Goal K >4, Mg >2   AKI on CKD  -component of cardiorenal disease  Hyponatremia Hypophosphatemia  Mild Hyperkalemia  P: Appreciate Nephrology input Concerned she will not have renal recovery.  Question if she would be a long term HD candidate Trend BMP / urinary output Replace electrolytes as indicated Avoid nephrotoxic agents, ensure adequate renal perfusion CRRT at -50 ml/hr  DM II P: Change to resistant scale Increase lantus to 20 units QD TF coverage 4 units Q4  Morbid Obesity  At Risk Malnutrition  P:  TF per Nutrition   Encephalopathy: QT 0.42 Increase Seroquel to 50 BID Start Clonazepam 1 BID Fentanyl drip at 100 mcg/hr Precedex drip  Best practice:  Diet: NPO,  TF  Pain/Anxiety/Delirium protocol (if indicated):  In place  VAP protocol (if indicated): In place  DVT prophylaxis: Heparin gtt GI prophylaxis: Pepcid Glucose control: SSI Mobility: Bed Code Status: Full Family Communication: Per primary Disposition: ICU (CRRT)  The patient is critically ill with multiple organ systems failure and requires high complexity decision making for assessment and support, frequent evaluation and titration of therapies, application of advanced monitoring technologies and extensive interpretation of multiple databases.   Critical Care Time devoted to patient care services described in this note is  34  Minutes. This time reflects time of care of this signee Dr Jennet Maduro. This critical care time does not reflect procedure time, or teaching time or supervisory time of PA/NP/Med student/Med Resident etc but could involve care discussion time.  Rush Farmer, M.D. [facility] Pulmonary/Critical Care Medicine. Pager: [phone]. After hours pager: [phone].

## 2018-10-09 NOTE — Progress Notes (Signed)
Redressed arterial line with the assistance of RN. Arterial dressing was lifting and the arterial line wasn't reading properly. Aline is secure and clean.

## 2018-10-10 ENCOUNTER — Inpatient Hospital Stay (HOSPITAL_COMMUNITY): Payer: Medicare HMO

## 2018-10-10 LAB — GLUCOSE, CAPILLARY
Glucose-Capillary: 268 mg/dL — ABNORMAL HIGH (ref 70–99)
Glucose-Capillary: 268 mg/dL — ABNORMAL HIGH (ref 70–99)
Glucose-Capillary: 286 mg/dL — ABNORMAL HIGH (ref 70–99)
Glucose-Capillary: 292 mg/dL — ABNORMAL HIGH (ref 70–99)
Glucose-Capillary: 305 mg/dL — ABNORMAL HIGH (ref 70–99)
Glucose-Capillary: 306 mg/dL — ABNORMAL HIGH (ref 70–99)
Glucose-Capillary: 325 mg/dL — ABNORMAL HIGH (ref 70–99)

## 2018-10-10 LAB — POCT I-STAT 7, (LYTES, BLD GAS, ICA,H+H)
Acid-Base Excess: 1 mmol/L (ref 0.0–2.0)
Bicarbonate: 25.9 mmol/L (ref 20.0–28.0)
Calcium, Ion: 1.19 mmol/L (ref 1.15–1.40)
HCT: 33 % — ABNORMAL LOW (ref 36.0–46.0)
Hemoglobin: 11.2 g/dL — ABNORMAL LOW (ref 12.0–15.0)
O2 Saturation: 93 %
Patient temperature: 98.6
Potassium: 4.2 mmol/L (ref 3.5–5.1)
Sodium: 136 mmol/L (ref 135–145)
TCO2: 27 mmol/L (ref 22–32)
pCO2 arterial: 43.4 mmHg (ref 32.0–48.0)
pH, Arterial: 7.384 (ref 7.350–7.450)
pO2, Arterial: 69 mmHg — ABNORMAL LOW (ref 83.0–108.0)

## 2018-10-10 LAB — HEPARIN LEVEL (UNFRACTIONATED)
Heparin Unfractionated: 0.14 IU/mL — ABNORMAL LOW (ref 0.30–0.70)
Heparin Unfractionated: 0.32 IU/mL (ref 0.30–0.70)

## 2018-10-10 LAB — HEPATIC FUNCTION PANEL
ALT: 7 U/L (ref 0–44)
AST: 33 U/L (ref 15–41)
Albumin: 2.1 g/dL — ABNORMAL LOW (ref 3.5–5.0)
Alkaline Phosphatase: 120 U/L (ref 38–126)
Bilirubin, Direct: 0.4 mg/dL — ABNORMAL HIGH (ref 0.0–0.2)
Indirect Bilirubin: 0.7 mg/dL (ref 0.3–0.9)
Total Bilirubin: 1.1 mg/dL (ref 0.3–1.2)
Total Protein: 7.6 g/dL (ref 6.5–8.1)

## 2018-10-10 LAB — RENAL FUNCTION PANEL
Albumin: 2 g/dL — ABNORMAL LOW (ref 3.5–5.0)
Albumin: 2.2 g/dL — ABNORMAL LOW (ref 3.5–5.0)
Anion gap: 10 (ref 5–15)
Anion gap: 12 (ref 5–15)
BUN: 46 mg/dL — ABNORMAL HIGH (ref 8–23)
BUN: 44 mg/dL — ABNORMAL HIGH (ref 8–23)
CO2: 23 mmol/L (ref 22–32)
CO2: 21 mmol/L — ABNORMAL LOW (ref 22–32)
Calcium: 8.4 mg/dL — ABNORMAL LOW (ref 8.9–10.3)
Calcium: 8.4 mg/dL — ABNORMAL LOW (ref 8.9–10.3)
Chloride: 101 mmol/L (ref 98–111)
Chloride: 102 mmol/L (ref 98–111)
Creatinine, Ser: 1.97 mg/dL — ABNORMAL HIGH (ref 0.44–1.00)
Creatinine, Ser: 1.69 mg/dL — ABNORMAL HIGH (ref 0.44–1.00)
GFR calc Af Amer: 30 mL/min — ABNORMAL LOW (ref >60)
GFR calc Af Amer: 36 mL/min — ABNORMAL LOW (ref >60)
GFR calc non Af Amer: 26 mL/min — ABNORMAL LOW (ref >60)
GFR calc non Af Amer: 31 mL/min — ABNORMAL LOW (ref >60)
Glucose, Bld: 328 mg/dL — ABNORMAL HIGH (ref 70–99)
Glucose, Bld: 307 mg/dL — ABNORMAL HIGH (ref 70–99)
Phosphorus: 2.2 mg/dL — ABNORMAL LOW (ref 2.5–4.6)
Phosphorus: 3.7 mg/dL (ref 2.5–4.6)
Potassium: 4.2 mmol/L (ref 3.5–5.1)
Potassium: 4.4 mmol/L (ref 3.5–5.1)
Sodium: 134 mmol/L — ABNORMAL LOW (ref 135–145)
Sodium: 135 mmol/L (ref 135–145)

## 2018-10-10 LAB — CBC
HCT: 30.7 % — ABNORMAL LOW (ref 36.0–46.0)
Hemoglobin: 9.5 g/dL — ABNORMAL LOW (ref 12.0–15.0)
MCH: 27.1 pg (ref 26.0–34.0)
MCHC: 30.9 g/dL (ref 30.0–36.0)
MCV: 87.5 fL (ref 80.0–100.0)
Platelets: 124 10*3/uL — ABNORMAL LOW (ref 150–400)
RBC: 3.51 MIL/uL — ABNORMAL LOW (ref 3.87–5.11)
RDW: 21.8 % — ABNORMAL HIGH (ref 11.5–15.5)
WBC: 13.1 10*3/uL — ABNORMAL HIGH (ref 4.0–10.5)
nRBC: 1.2 % — ABNORMAL HIGH (ref 0.0–0.2)

## 2018-10-10 LAB — PHOSPHORUS: Phosphorus: 2.2 mg/dL — ABNORMAL LOW (ref 2.5–4.6)

## 2018-10-10 LAB — MAGNESIUM: Magnesium: 2.8 mg/dL — ABNORMAL HIGH (ref 1.7–2.4)

## 2018-10-10 LAB — AMMONIA: Ammonia: 39 umol/L — ABNORMAL HIGH (ref 9–35)

## 2018-10-10 LAB — VANCOMYCIN, TROUGH: Vancomycin Tr: 22 ug/mL (ref 15–20)

## 2018-10-10 MED ORDER — INSULIN ASPART 100 UNIT/ML ~~LOC~~ SOLN
7.0000 [IU] | SUBCUTANEOUS | Status: DC
  Administered 2018-10-10 – 2018-10-12 (×12): 7 [IU] via SUBCUTANEOUS

## 2018-10-10 MED ORDER — INSULIN DETEMIR 100 UNIT/ML ~~LOC~~ SOLN
20.0000 [IU] | Freq: Two times a day (BID) | SUBCUTANEOUS | Status: DC
  Administered 2018-10-10 – 2018-10-11 (×3): 20 [IU] via SUBCUTANEOUS
  Filled 2018-10-10 (×5): qty 0.2

## 2018-10-10 MED ORDER — VANCOMYCIN HCL IN DEXTROSE 750-5 MG/150ML-% IV SOLN
750.0000 mg | INTRAVENOUS | Status: DC
  Administered 2018-10-10 – 2018-10-11 (×2): 750 mg via INTRAVENOUS
  Filled 2018-10-10 (×2): qty 150

## 2018-10-10 MED ORDER — SODIUM PHOSPHATES 45 MMOLE/15ML IV SOLN
30.0000 mmol | Freq: Once | INTRAVENOUS | Status: AC
  Administered 2018-10-10: 30 mmol via INTRAVENOUS
  Filled 2018-10-10: qty 10

## 2018-10-10 NOTE — Progress Notes (Signed)
Patient ID: Kathleen [name], female   DOB: 1950-05-13, 68 y.o.   MRN: [id number]    Advanced Heart Failure Rounding Note   Subjective:     Remains intubated. Awake but not following commands. On low dose-fentanyl, klonopin and seroquel Now getting chest PT. NE down to 6.    HR 40-50s. CVP 6.   Objective:   Weight Range:  Vital Signs:   Temp:  [97 F (36.1 C)-99.2 F (37.3 C)] 97 F (36.1 C) (06/15 0700) Pulse Rate:  [39-53] 52 (06/15 0900) Resp:  [11-26] 22 (06/15 0900) BP: (93-108)/(37-48) 108/48 (06/15 0825) SpO2:  [89 %-100 %] 96 % (06/15 0901) Arterial Line BP: (78-132)/(25-68) 111/45 (06/15 0900) FiO2 (%):  [40 %-50 %] 40 % (06/15 0901) Weight:  [101.3 kg] 101.3 kg (06/15 0300) Last BM Date: 09/29/18(PER CHARTING)  Weight change: Filed Weights   10/08/18 0300 10/09/18 0309 10/10/18 0300  Weight: 104.4 kg 103 kg 101.3 kg    Intake/Output:   Intake/Output Summary (Last 24 hours) at 10/10/2018 0911 Last data filed at 10/10/2018 0900 Gross per 24 hour  Intake 2095.99 ml  Output 4083 ml  Net -1987.01 ml     Physical Exam: General:  On vent. Awake but not following commands.  HEENT: normal +ETT  Neck: supple. no JVD. Carotids 2+ bilat; no bruits. No lymphadenopathy or thryomegaly appreciated. Cor: PMI nondisplaced. Iregular rate & rhythm. No rubs, gallops or murmurs. R chest cath Lungs: coarse L>>R Abdomen: obese soft, nontender, nondistended. No hepatosplenomegaly. No bruits or masses. Good bowel sounds. Extremities: no cyanosis, clubbing, rash, edema Neuro: awake on vent. Not following commands.    Telemetry: Slow AFL 40-50s. Personally reviewed   Labs: Basic Metabolic Panel: Recent Labs  Lab 10/07/18 0310  10/08/18 0403 10/08/18 1532 10/09/18 0308 10/09/18 0445 10/09/18 1549 10/10/18 0418 10/10/18 0423  NA 136   137   < > 132* 131* 134* 137 132* 134* 136  K 4.6   4.6   < > 5.7* 5.0 4.3 4.2 4.0 4.2 4.2  CL 105   106   < > 100 99 99  --  100 101   --   CO2 21*   23   < > 23 24 24   --  24 23  --   GLUCOSE 227*   238*   < > 327* 350* 343*  --  363* 328*  --   BUN 34*   36*   < > 33* 36* 39*  --  41* 46*  --   CREATININE 2.05*   1.99*   < > 1.73* 1.81* 1.96*  --  1.91* 1.97*  --   CALCIUM 7.4*   7.4*   < > 8.0* 8.0* 8.3*  --  8.1* 8.4*  --   MG 2.4  --  2.8*  --  2.8*  --   --  2.8*  --   PHOS 2.1*   2.1*   < > 2.2* 2.1* 2.1*  --  2.3* 2.2*   2.2*  --    < > = values in this interval not displayed.    Liver Function Tests: Recent Labs  Lab 10/08/18 0403 10/08/18 1532 10/09/18 0308 10/09/18 1549 10/10/18 0418  ALBUMIN 1.8* 1.9* 2.0* 1.9* 2.0*   No results for input(s): LIPASE, AMYLASE in the last 168 hours. No results for input(s): AMMONIA in the last 168 hours.  CBC: Recent Labs  Lab 10/06/18 0300  10/07/18 0310 10/08/18 0403 10/09/18 0308 10/09/18 0445 10/10/18  1610 10/10/18 0423  WBC 9.0  --  23.2* 13.9* 14.8*  --  13.1*  --   HGB 9.7*   < > 9.4* 8.9* 9.2* 10.9* 9.5* 11.2*  HCT 33.9*   < > 32.8* 30.4* 31.5* 32.0* 30.7* 33.0*  MCV 86.9  --  87.5 88.4 87.3  --  87.5  --   PLT 134*  --  129* 85* 149*  --  124*  --    < > = values in this interval not displayed.    Cardiac Enzymes: No results for input(s): CKTOTAL, CKMB, CKMBINDEX, TROPONINI in the last 168 hours.  BNP: BNP (last 3 results) Recent Labs    09/22/18 1420  BNP 451.2*    ProBNP (last 3 results) No results for input(s): PROBNP in the last 8760 hours.    Other results:  Imaging: Dg Chest Port 1 View  Result Date: 10/10/2018 CLINICAL DATA:  ETT present,resp failure EXAM: PORTABLE CHEST - 1 VIEW COMPARISON:  10/09/2018 FINDINGS: Endotracheal tube, feeding tube, left IJ central venous catheter, and tunneled right IJ hemodialysis catheter stable in position. Bilateral patchy interstitial and airspace opacities with a predominantly perihilar and left lower lung distribution as before. Heart size and mediastinal contours are within normal limits.  No effusion. No pneumothorax. Visualized bones unremarkable. IMPRESSION: 1. Stable asymmetric infiltrates or edema. 2. Support hardware stable in position. Electronically Signed   By: Lucrezia Europe M.D.   On: 10/10/2018 07:52   Dg Chest Port 1 View  Result Date: 10/09/2018 CLINICAL DATA:  Patient intubated. EXAM: PORTABLE CHEST 1 VIEW COMPARISON:  October 08, 2018 FINDINGS: The ETT is in good position. The feeding tube terminates below today's film. Other support apparatus including a right central line and a left PICC line are stable. No pneumothorax. Diffuse bilateral pulmonary infiltrates remain, similar to slightly improved in the interval. No change in the cardiomediastinal silhouette. IMPRESSION: 1. Support apparatus as above, in good position. 2. Diffuse bilateral pulmonary infiltrates remain, similar to slightly improved in the interval. Electronically Signed   By: Dorise Bullion III M.D   On: 10/09/2018 08:05     Medications:     Scheduled Medications:  atorvastatin  40 mg Oral q1800   chlorhexidine gluconate (MEDLINE KIT)  15 mL Mouth Rinse BID   Chlorhexidine Gluconate Cloth  6 each Topical Daily   clonazePAM  1 mg Per Tube BID   feeding supplement (PRO-STAT SUGAR FREE 64)  30 mL Per Tube QID   feeding supplement (VITAL 1.5 CAL)  1,000 mL Per Tube Q24H   hydrocortisone sodium succinate  50 mg Intravenous Q6H   insulin aspart  0-20 Units Subcutaneous Q4H   insulin aspart  4 Units Subcutaneous Q4H   insulin glargine  20 Units Subcutaneous Daily   mouth rinse  15 mL Mouth Rinse 10 times per day   midodrine  10 mg Oral TID WC   QUEtiapine  50 mg Oral BID    Infusions:   prismasol BGK 4/2.5 500 mL (10/10/18 0150)    prismasol BGK 4/2.5 300 mL (10/09/18 2112)   sodium chloride     sodium chloride Stopped (10/10/18 0014)   ceFEPime (MAXIPIME) IV Stopped (10/09/18 2335)   dexmedetomidine (PRECEDEX) IV infusion Stopped (10/09/18 1044)   famotidine (PEPCID) IV  Stopped (10/09/18 1501)   fentaNYL infusion INTRAVENOUS 25 mcg/hr (10/10/18 0900)   heparin 1,200 Units/hr (10/10/18 0900)   norepinephrine (LEVOPHED) Adult infusion 7 mcg/min (10/10/18 0900)   prismasol BGK 4/2.5 1,500 mL/hr (  10/10/18 0536)   sodium chloride 999 mL/hr at 10/06/18 1018   sodium phosphate  Dextrose 5% IVPB 43 mL/hr at 10/10/18 0900   vancomycin Stopped (10/09/18 1302)    PRN Medications: Place/Maintain arterial line **AND** sodium chloride, sodium chloride, acetaminophen (TYLENOL) oral liquid 160 mg/5 mL, bisacodyl, docusate, heparin, midazolam, ondansetron, sodium chloride, sodium chloride flush   Assessment/Plan:   1. PEA arrest on 5/30 - due to hypoxia/pulmonary edema. - now stable  2. Acute on chronic hypoxic respiratory failure - has underlying  OHS/OSA  - intubated on 5/30 in setting of arrest.  - treated for MSSA PNA - extubated 6/3.  - reintubated 6/11 with worsening RUL aispace disease.  - abx broadened to cefipime/vanc.  - I think this is main barrier to extubation. Volume status currently looks good. Would continue with current treatment and hopefully we can give her one more shot at extubation before considering trach. Management per CCM.   3.  Acute on chronic diastolic HF - Admitted with markedly elevated filling pressures complicated by severe cardiorenal syndrome - Now down 80 pounds with CVVHD. Suspect this as likely as good as we can get her volume status.  - Anuric.Suspect she may progress to long-term HD - Volume management per Renal - ? Need to w/u for possible TTR amyloid  4. Pulmonary HTN, severe  - this is mostly pulmonary venous HTN by cath [facility] 2) but also has a component of OHS/OSA (WHO group 3) - no role for selective pulmonary vasodilators - will improve significantly with volume removal - consider repeat RHC after volume removal as tolerated   5. AKI on CKD 3 - baseline creatinine 1.9 -> ESRD - likely  cardiorenal - We have suspected that she may progress to long-term HD. Remains anuric  6. Morbid obesity - needs weight loss  6. Chronic AF/AFL - Off apixaban. Now on heparin. No bleeding.  Discussed dosing with PharmD personally. - Rhythm looks to be slow AFL currently - no change  (has had some junctional and sinus brady) - No role for PPM currently  7. VT/NSVT - Quiescent.  off amio due to bradycardia - Keep K> 4.0 Mg 2.0  8. MSSA PNA - Bcx drawn 6/2 & 6/11. NGTD - sputum cx with MSSA PNA - CXR with worsening RUL PNA. Cefazolin switched to cefipime/vanc  9. Hypotension - ?  Due to sepsis - Wean as tolerated  Have d/w CCM service who will take over primary management.  We will continue to follow at a distance to keep an eye on her HR  CRITICAL CARE Performed by: Glori Bickers  Total critical care time: 35 minutes  Critical care time was exclusive of separately billable procedures and treating other patients.  Critical care was necessary to treat or prevent imminent or life-threatening deterioration.  Critical care was time spent personally by me (independent of midlevel providers or residents) on the following activities: development of treatment plan with patient and/or surrogate as well as nursing, discussions with consultants, evaluation of patient's response to treatment, examination of patient, obtaining history from patient or surrogate, ordering and performing treatments and interventions, ordering and review of laboratory studies, ordering and review of radiographic studies, pulse oximetry and re-evaluation of patient's condition.    Length of Stay: 29  Glori Bickers MD 10/10/2018, 9:11 AM  Advanced Heart Failure Team Pager [phone] (M-F; [place])  Please contact [facility] for night-coverage after hours (4p -7a ) and weekends on [email or web address]

## 2018-10-10 NOTE — Progress Notes (Signed)
Pharmacy Antibiotic Note  Kathleen [name] is a 68 y.o. female admitted on 09/22/2018, now spiking fevers and concern for sepsis.  Pharmacy has been consulted for vanc and cefepime dosing.   Patient continues on CRRT continues today.   Vancomycin trough drawn this morning was slightly above goal at 22, will make slight dose reduction.   Plan: Reduce vancomycin to 750 q24 hours -follow renal plan crrt vs iHD Continue cefepime 2g q12 hours  Height: 5' (152.4 cm) Weight: 223 lb 5.2 oz (101.3 kg) IBW/kg (Calculated) : 45.5  Temp (24hrs), Avg:98.3 F (36.8 C), Min:97 F (36.1 C), Max:99.2 F (37.3 C)  Recent Labs  Lab 10/06/18 0300  10/07/18 0310  10/08/18 0403 10/08/18 1532 10/09/18 0308 10/09/18 1549 10/10/18 0418  WBC 9.0  --  23.2*  --  13.9*  --  14.8*  --  13.1*  CREATININE 2.78*   < > 2.05*  1.99*   < > 1.73* 1.81* 1.96* 1.91* 1.97*   < > = values in this interval not displayed.    Estimated Creatinine Clearance: 30.1 mL/min (A) (by C-G formula based on SCr of 1.97 mg/dL (H)).    Allergies  Allergen Reactions  . Doxycycline     REACTION: Wheals and pruritus    Thank you for allowing pharmacy to be a part of this patient's care.  [name] PharmD., BCPS Clinical Pharmacist 10/10/2018 8:20 AM

## 2018-10-10 NOTE — Progress Notes (Signed)
ANTICOAGULATION CONSULT NOTE - Follow Up Consult  Pharmacy Consult for Heparin Indication: AFIB  Allergies  Allergen Reactions  . Doxycycline     REACTION: Wheals and pruritus    Patient Measurements: Height: 5' (152.4 cm) Weight: 223 lb 5.2 oz (101.3 kg) IBW/kg (Calculated) : 45.5 Heparin Dosing Weight: 70.2kg  Vital Signs: Temp: 98.1 F (36.7 C) (06/15 1622) Temp Source: Oral (06/15 1622) BP: 121/42 (06/15 1800) Pulse Rate: 48 (06/15 1800)  Labs: Recent Labs    10/08/18 0403  10/09/18 0308 10/09/18 0445 10/09/18 1549 10/10/18 0418 10/10/18 0423 10/10/18 1637 10/10/18 1851  HGB 8.9*  --  9.2* 10.9*  --  9.5* 11.2*  --   --   HCT 30.4*  --  31.5* 32.0*  --  30.7* 33.0*  --   --   PLT 85*  --  149*  --   --  124*  --   --   --   HEPARINUNFRC 0.26*  --  0.25*  --   --  0.14*  --   --  0.32  CREATININE 1.73*   < > 1.96*  --  1.91* 1.97*  --  1.69*  --    < > = values in this interval not displayed.    Estimated Creatinine Clearance: 35 mL/min (A) (by C-G formula based on SCr of 1.69 mg/dL (H)).  Assessment:  Anticoag: apix 5mg  BID PTA dose ok Afib (LD?) >> Hep gtt . HL 0.32 in goal. Cbc stable  Goal of Therapy:  Heparin level 0.3-0.7 units/ml Monitor platelets by anticoagulation protocol: Yes   Plan:  Continue IV heparin at 1200 units/hr Daily HL and CBC   Farhiya Rosten S. Alford Highland, PharmD, BCPS Clinical Staff Pharmacist Eilene Ghazi Stillinger 10/10/2018,7:24 PM

## 2018-10-10 NOTE — Progress Notes (Signed)
Subjective: Interval History: has no complaint, eyes open, but not coop, on vent, .  Objective: Vital signs in last 24 hours: Temp:  [98.1 F (36.7 C)-99.2 F (37.3 C)] 99.2 F (37.3 C) (06/14 2000) Pulse Rate:  [39-53] 40 (06/15 0700) Resp:  [11-26] 17 (06/15 0700) BP: (93-107)/(37-38) 93/37 (06/14 1517) SpO2:  [92 %-100 %] 94 % (06/15 0700) Arterial Line BP: (82-123)/(25-50) 84/33 (06/15 0700) FiO2 (%):  [40 %-50 %] 40 % (06/15 0315) Weight:  [101.3 kg] 101.3 kg (06/15 0300) Weight change: -1.7 kg  Intake/Output from previous day: 06/14 0701 - 06/15 0700 In: 2070.4 [I.V.:570.4; NG/GT:985; IV Piggyback:449.9] Out: 3973  Intake/Output this shift: No intake/output data recorded.  General appearance: no distress, morbidly obese, pale and on vent, not coop Resp: rales bilaterally and rhonchi bilaterally Chest wall: RIJ PC Cardio: S1, S2 normal and systolic murmur: systolic ejection 2/6, crescendo and decrescendo at 2nd left intercostal space GI: obese, pos bs, soft, liver down 6 cm Extremities: edema 3+  Lab Results: Recent Labs    10/09/18 0308  10/10/18 0418 10/10/18 0423  WBC 14.8*  --  13.1*  --   HGB 9.2*   < > 9.5* 11.2*  HCT 31.5*   < > 30.7* 33.0*  PLT 149*  --  124*  --    < > = values in this interval not displayed.   BMET:  Recent Labs    10/09/18 1549 10/10/18 0418 10/10/18 0423  NA 132* 134* 136  K 4.0 4.2 4.2  CL 100 101  --   CO2 24 23  --   GLUCOSE 363* 328*  --   BUN 41* 46*  --   CREATININE 1.91* 1.97*  --   CALCIUM 8.1* 8.4*  --    No results for input(s): PTH in the last 72 hours. Iron Studies: No results for input(s): IRON, TIBC, TRANSFERRIN, FERRITIN in the last 72 hours.  Studies/Results: Dg Chest Port 1 View  Result Date: 10/09/2018 CLINICAL DATA:  Patient intubated. EXAM: PORTABLE CHEST 1 VIEW COMPARISON:  October 08, 2018 FINDINGS: The ETT is in good position. The feeding tube terminates below today's film. Other support apparatus  including a right central line and a left PICC line are stable. No pneumothorax. Diffuse bilateral pulmonary infiltrates remain, similar to slightly improved in the interval. No change in the cardiomediastinal silhouette. IMPRESSION: 1. Support apparatus as above, in good position. 2. Diffuse bilateral pulmonary infiltrates remain, similar to slightly improved in the interval. Electronically Signed   By: Dorise Bullion III M.D   On: 10/09/2018 08:05   Dg Chest Port 1 View  Result Date: 10/08/2018 CLINICAL DATA:  Acute respiratory failure with hypoxia. EXAM: PORTABLE CHEST 1 VIEW COMPARISON:  10/07/2018 FINDINGS: Endotracheal tube terminates at the inferior margin of the clavicular heads. Right jugular catheter, left PICC, and enteric tube remain in place. The cardiac silhouette remains enlarged. Extensive patchy and confluent airspace opacities bilaterally, left mildly worse than right, are unchanged aside from mild improvement in the right midlung. No large pleural effusion or pneumothorax is identified. IMPRESSION: Extensive bilateral airspace disease, slightly improved on the right. Electronically Signed   By: Logan Bores M.D.   On: 10/08/2018 10:16    I have reviewed the patient's current medications.  Assessment/Plan: 1 AKI oliguric, shock, low bps. Vol xs, low phos 2 Shock on NE 3 Cardiac arrest. 4 DM controlled 5 Obesity 6 VDRF on , ? infx vs vol. P cont CRRT, lower vol ,  solute,  Replete phos, vent, support bp.      LOS: 18 days   Kathleen [name] 10/10/2018,7:05 AM

## 2018-10-10 NOTE — Progress Notes (Signed)
[place] for Heparin Indication: atrial fibrillation  Allergies  Allergen Reactions  . Doxycycline     REACTION: Wheals and pruritus    Patient Measurements: Height: 5' (152.4 cm) Weight: 223 lb 5.2 oz (101.3 kg) IBW/kg (Calculated) : 45.5  Vital Signs: Temp: 97 F (36.1 C) (06/15 0700) Temp Source: Axillary (06/15 0700) Pulse Rate: 40 (06/15 0700)  Labs: Recent Labs    10/08/18 0403  10/09/18 0308 10/09/18 0445 10/09/18 1549 10/10/18 0418 10/10/18 0423  HGB 8.9*  --  9.2* 10.9*  --  9.5* 11.2*  HCT 30.4*  --  31.5* 32.0*  --  30.7* 33.0*  PLT 85*  --  149*  --   --  124*  --   HEPARINUNFRC 0.26*  --  0.25*  --   --  0.14*  --   CREATININE 1.73*   < > 1.96*  --  1.91* 1.97*  --    < > = values in this interval not displayed.    Estimated Creatinine Clearance: 30.1 mL/min (A) (by C-G formula based on SCr of 1.97 mg/dL (H)).  Assessment: Pt is a 67yoF with a history of afib. She was on Eliquis PTA, received in hospital, last dose 5/29. Apixaban on hold for potential need for [facility], currently on CRRT. Pharmacy consulted to dose heparin  Heparin level 0.14 this morning on 1000 units/hr. CBC stable, no issues with infusion or bleeding noted per nursing    Goal of Therapy:  Heparin level 0.3-0.7 units/ml  Monitor platelets by anticoagulation protocol: Yes   Plan:  Increase heparin 1200 units/hr -Heparin level and CBC daily -Follow plans to transition to [place] once extubated  [name] PharmD., BCPS Clinical Pharmacist 10/10/2018 8:07 AM

## 2018-10-10 NOTE — Progress Notes (Addendum)
NAME:  Kathleen [name], MRN:  [id number], DOB:  03/10/1951, LOS: 77 ADMISSION DATE:  09/22/2018, CONSULTATION DATE: 09/24/2018 REFERRING MD: Candee Furbish MD, CHIEF COMPLAINT: Cardiac arrest  Brief History   68 y/o obese woman with chronic diastolic heart failure who failed outpatient diuretics management due to chronic kidney disease.  Right heart cath on 5/29 showed markedly elevated biventricular filling pressures with normal cardiac output.  She was on cardiology service, maintained on milrinone and Lasix.  On the morning of 5/30 she went into PEA arrest, CPR performed for 8 minutes.  Intubated and transferred to ICU.  Started on CRRT 5/31.  Extubated 6/3.  Re-intubated early am 6/11 for hypercarbic respiratory failure, possible RUL PNA.  Past Medical History   has a past medical history of Atrial flutter ([place]), Bradycardia, Chronic diastolic (congestive) heart failure ([place]) (01/10/2015), CKD (chronic kidney disease), stage IV ([place]), Degenerative joint disease of hand, Diabetes mellitus, Dyslipidemia, Fecal occult blood test positive, GERD (gastroesophageal reflux disease), Headache, Hypertension, Inadequate material resources, Irritable bowel syndrome, Morbid obesity ([place]), Obesity hypoventilation syndrome ([place]), Post-menopausal bleeding, and Shortness of breath dyspnea.  Significant Hospital Events   5/28 Admit 5/29 RHC 5/30 PEA arrest, intubated/ CRRT 5/31 Milrinone stopped due to ectopy; brady episode- amio stopped 6/02 Attempt at SBT, chest x-ray with worsening pulmonary edema, CVVHD for volume removal 6/03 Extubated > remain on CRRT.  Refused nocturnal BiPAP 6/04 Refused nocturnal BiPAP 6/11 Intubated early am with hypercarbia, concern for RUL PNA  Consults:  Cardiology PCCM  Nephrology   Procedures:  Lt PICC 5/29 >> OETT 5/30 >> 6/3 R IJ HD cath 5/30 >> Aline left ulnar 5/30 >> 6/2 ETT 6/11 >>   Significant Diagnostic Tests:  TTE 5/28 >>The left ventricle has normal systolic  function with an ejection fraction of 60-65%. The cavity size was normal. Left ventricular diastolic doppler parameters are indeterminate. No evidence of left ventricular regional wall motion abnormalities. The right ventricle has normal systolic function. The cavity was mildly enlarged. There is no increase in right ventricular wall thickness. Right atrial size was mildly dilated. The aortic valve is tricuspid. Mild thickening of the aortic valve. Mild calcification of the aortic valve. Aortic valve regurgitation is trivial by color flow Doppler.The aortic root is normal in size and structure.  Right heart cath 5/29 RA = 24 RV = 94/26 PA = 95/36 (53) PCW = 30 (v = 50) Fick cardiac output/index = 6.0/2.7 PVR =3.9 WU FA sat = 91% PA sat = 58%, 58% SVC 60%  CXR 6/15> Bilateral ASD LLL > R. ETT, GT, LIJ and RIJ CVCs stable position.   Micro Data:  SARS coronavirus 2 cepheid 5/28 >> neg MRSA PCR 5/28 >> neg Trach aspirate 6/2 >> MSSA BCx2 6/2 >> negative Tracheal aspirate 6/11 >> few GNR >> BCx2 6/11 >>  Antimicrobials:  Vancomycin 6/3 > 6/4 Cefazolin 6/4 >> 6/10 Vanco 6/11 >>  Cefepime 6/11 >>  Interim history/subjective:  Ongoing hyperglycemia No acute events  Remains on pressors, remains intubated.   Objective   Blood pressure (!) 108/48, pulse (!) 39, temperature (!) 97 F (36.1 C), temperature source Axillary, resp. rate 20, height 5' (1.524 m), weight 101.3 kg, SpO2 94 %. CVP:  [6 mmHg-10 mmHg] 6 mmHg  Vent Mode: CPAP;PSV FiO2 (%):  [40 %-50 %] 40 % Set Rate:  [18 bmp] 18 bmp Vt Set:  [420 mL] 420 mL PEEP:  [5 cmH20] 5 cmH20 Pressure Support:  [10 cmH20] 10 cmH20 Plateau Pressure:  [  17 cmH20-21 cmH20] 20 cmH20   Intake/Output Summary (Last 24 hours) at 10/10/2018 3382 Last data filed at 10/10/2018 0900 Gross per 24 hour  Intake 2095.99 ml  Output 4083 ml  Net -1987.01 ml   Filed Weights   10/08/18 0300 10/09/18 0309 10/10/18 0300  Weight: 104.4 kg 103 kg 101.3  kg   Examination: General: Chronically ill appearing, morbidly obese older adult female, intubated sedated NAD HEENT: NCAT ETT OGT secure. Trachea midline. Pink mmm  Neuro: Lightly sedated, PERRL, Moves spontaneously.  CV: Sinus bradycardia. S1s2. Capillary refill < 3 seconds.  PULM: Symmetrical chest expansion. L>R rhonchi. Synchronous with vent  GI: Obese, soft, round, normoactive  Extremities: Symmetrical bulk and tone, no cyanosis no clubbing Skin: Clean, dry, warm, without rash   Resolved Hospital Problem list     Assessment & Plan:   PEA arrest  -suspect hypoxia driven Acute on chronic diastolic heart failure -admit with elevated filling pressures, cardiorenal syndrome  Pulmonary hypertension, severe  -s/p RHC, WHO class 2,3 Permanent AF/AFl -on anticoagulation pre admit P: ICU monitoring  Stress dose steroids SBP goal > 90, titrate NE for goal Midodrine 10 mg TID   CRRT per nephrology   Acute on Chronic Hypoxemic / Hypercarbic Respiratory Failure  -extubated 6/3, refused bipap, reintubated 6/11 for AMS, hypercarbia  -multifactorial, initially volume overload, tracheal aspirate + for MSSA, new RUL infiltrate 6/11  OSA / OHS  -not compliant with BiPAP while inpatient  P: Continue PRVC AM WUA/SBT Adjust PEEP/FiO2 for SpO2 >90% Continue volume negative AM CXR Continue to wean as able, if unsuccessful evaluate for trach   Acute encephalopathy -CNS depressing medications vs icu delirium vs sepsis P seroquel 50mg  BID Clonazepam 1mg  BID ICU delirium precaution RN intervention abx as above Check ammonia, LFTs   MSSA PNA / RUL Infiltrate / Possible Aspiration  P: Broad spectrum ABX as above, continue given shock   NSVT/VT -off amio due to bradycardia P: Per Cardiology  Goal K >4, Mg >2  Continue telemetry   Acute on CKD -component of cardiorenal disease  Hypophosphatemia  P: Nephrology managing CRRT, appreciate assistance Normalize electrolyte  abnormalities as indicated Minimize nephrotoxic medications MAP goal > 65 for adequate renal perfusion Renal function panel   DM II with ongoing hyperglycemia -in setting critical illness  P: Resistant SSI  lantus to 20 units QD TF coverage 4 units Q4 DM coordinator consult, would like to avoid insulin gtt if possible   Morbid Obesity, malnourished P: EN  Best practice:  Diet: EN  Pain/Anxiety/Delirium protocol (if indicated):  In place  VAP protocol (if indicated): In place  DVT prophylaxis: Heparin gtt GI prophylaxis: Pepcid Glucose control: SSI Mobility: Bed Code Status: Full Family Communication: Pending  Disposition: ICU  Critical Care Time 40 minutes   Eliseo Gum MSN, AGACNP-BC [place] [phone] If no answer, [phone] 10/10/2018, 9:28 AM  Attending Note:  68 year old female with extensive cardiac history presenting with pulmonary edema and respiratory failure.  On exam, more sedate this AM and not tolerating wean.  Continues to require pressors.  On exam, coarse BS diffusely with soft abdomen.  I reviewed CXR myself, ETT is in a good position with pulmonary edema.  Will continue weaning O2 to off and volume off.  Abx as ordered.  F/U on culture.  PCCM will continue to follow.  The patient is critically ill with multiple organ systems failure and requires high complexity decision making for assessment and support, frequent evaluation  and titration of therapies, application of advanced monitoring technologies and extensive interpretation of multiple databases.   Critical Care Time devoted to patient care services described in this note is  33  Minutes. This time reflects time of care of this signee Dr Jennet Maduro. This critical care time does not reflect procedure time, or teaching time or supervisory time of PA/NP/Med student/Med Resident etc but could involve care discussion time.  Rush Farmer, M.D. [facility]  Pulmonary/Critical Care Medicine. Pager: [phone]. After hours pager: [phone].

## 2018-10-10 NOTE — Progress Notes (Signed)
Inpatient Diabetes Program Recommendations  AACE/ADA: New Consensus Statement on Inpatient Glycemic Control (2015)  Target Ranges:  Prepandial:   less than 140 mg/dL      Peak postprandial:   less than 180 mg/dL (1-2 hours)      Critically ill patients:  140 - 180 mg/dL   Lab Results  Component Value Date   GLUCAP 286 (H) 10/10/2018   HGBA1C 7.4 (A) 09/13/2018    Review of Glycemic Control Results for VERBA, AINLEY (MRN [id number]) as of 10/10/2018 10:15  Ref. Range 10/09/2018 20:14 10/10/2018 00:02 10/10/2018 04:22 10/10/2018 08:35  Glucose-Capillary Latest Ref Range: 70 - 99 mg/dL 321 (H) 306 (H) 305 (H) 286 (H)   Diabetes history: DM 2 Home meds:  Levemir 65 units daily, Victoza 1.8 mg daily, Metformin 500 mg bid Current orders for Inpatient glycemic control:  Lantus 20 units daily, Novolog 4 units q 4 hours, Novolog resistant q 4 hours  Inpatient Diabetes Program Recommendations:    Note patient has required approximately 86 units of Novolog correction in past 24 hours.  Consider d/c of Lantus and start Levemir 20 units bid.  Also consider increasing Novolog meal coverage to 7 units q 4 hours.  If blood sugars remain elevated, may need to restart IV insulin.   Thanks,  Adah Perl, RN, BC-ADM Inpatient Diabetes Coordinator Pager [phone] (8a-5p)

## 2018-10-11 ENCOUNTER — Inpatient Hospital Stay (HOSPITAL_COMMUNITY): Payer: Medicare HMO

## 2018-10-11 LAB — RENAL FUNCTION PANEL
Albumin: 2.4 g/dL — ABNORMAL LOW (ref 3.5–5.0)
Anion gap: 12 (ref 5–15)
BUN: 42 mg/dL — ABNORMAL HIGH (ref 8–23)
CO2: 22 mmol/L (ref 22–32)
Calcium: 8.6 mg/dL — ABNORMAL LOW (ref 8.9–10.3)
Chloride: 100 mmol/L (ref 98–111)
Creatinine, Ser: 1.65 mg/dL — ABNORMAL HIGH (ref 0.44–1.00)
GFR calc Af Amer: 37 mL/min — ABNORMAL LOW (ref >60)
GFR calc non Af Amer: 32 mL/min — ABNORMAL LOW (ref >60)
Glucose, Bld: 308 mg/dL — ABNORMAL HIGH (ref 70–99)
Phosphorus: 2.8 mg/dL (ref 2.5–4.6)
Potassium: 4.6 mmol/L (ref 3.5–5.1)
Sodium: 134 mmol/L — ABNORMAL LOW (ref 135–145)

## 2018-10-11 LAB — GLUCOSE, CAPILLARY
Glucose-Capillary: 565 mg/dL (ref 70–99)
Glucose-Capillary: 291 mg/dL — ABNORMAL HIGH (ref 70–99)
Glucose-Capillary: 258 mg/dL — ABNORMAL HIGH (ref 70–99)
Glucose-Capillary: 302 mg/dL — ABNORMAL HIGH (ref 70–99)
Glucose-Capillary: 259 mg/dL — ABNORMAL HIGH (ref 70–99)
Glucose-Capillary: 291 mg/dL — ABNORMAL HIGH (ref 70–99)
Glucose-Capillary: 322 mg/dL — ABNORMAL HIGH (ref 70–99)

## 2018-10-11 LAB — CULTURE, BLOOD (ROUTINE X 2)
Culture: NO GROWTH
Culture: NO GROWTH
Special Requests: ADEQUATE

## 2018-10-11 LAB — CBC
HCT: 38.2 % (ref 36.0–46.0)
Hemoglobin: 11.5 g/dL — ABNORMAL LOW (ref 12.0–15.0)
MCH: 26.3 pg (ref 26.0–34.0)
MCHC: 30.1 g/dL (ref 30.0–36.0)
MCV: 87.2 fL (ref 80.0–100.0)
Platelets: 170 10*3/uL (ref 150–400)
RBC: 4.38 MIL/uL (ref 3.87–5.11)
RDW: 22.8 % — ABNORMAL HIGH (ref 11.5–15.5)
WBC: 17.6 10*3/uL — ABNORMAL HIGH (ref 4.0–10.5)
nRBC: 2.2 % — ABNORMAL HIGH (ref 0.0–0.2)

## 2018-10-11 LAB — MAGNESIUM: Magnesium: 2.7 mg/dL — ABNORMAL HIGH (ref 1.7–2.4)

## 2018-10-11 LAB — HEPARIN LEVEL (UNFRACTIONATED): Heparin Unfractionated: 0.33 IU/mL (ref 0.30–0.70)

## 2018-10-11 MED ORDER — SODIUM CHLORIDE 0.9% FLUSH
10.0000 mL | Freq: Two times a day (BID) | INTRAVENOUS | Status: DC
  Administered 2018-10-11: 10:00:00 20 mL
  Administered 2018-10-11 – 2018-10-22 (×16): 10 mL
  Administered 2018-10-22: 18:00:00 30 mL
  Administered 2018-10-23 – 2018-10-24 (×2): 10 mL
  Administered 2018-10-24 – 2018-10-25 (×2): 20 mL
  Administered 2018-10-26: 10:00:00 10 mL

## 2018-10-11 MED ORDER — SODIUM CHLORIDE 0.9% FLUSH: 10.0000 mL | INTRAVENOUS | Status: DC | PRN

## 2018-10-11 NOTE — Progress Notes (Addendum)
Inpatient Diabetes Program Recommendations  AACE/ADA: New Consensus Statement on Inpatient Glycemic Control (2015)  Target Ranges:  Prepandial:   less than 140 mg/dL      Peak postprandial:   less than 180 mg/dL (1-2 hours)      Critically ill patients:  140 - 180 mg/dL   Lab Results  Component Value Date   GLUCAP 258 (H) 10/11/2018   HGBA1C 7.4 (A) 09/13/2018    Review of Glycemic Control Results for Kathleen [name], Kathleen [name] (MRN [id number]) as of 10/11/2018 10:29  Ref. Range 10/10/2018 16:32 10/10/2018 19:58 10/10/2018 23:43 10/11/2018 03:30 10/11/2018 08:20  Glucose-Capillary Latest Ref Range: 70 - 99 mg/dL 268 (H) 325 (H) 268 (H) 291 (H) 258 (H)  Diabetes history: DM 2 Home meds: Levemir 65 units daily, Victoza 1.8 mg daily, Metformin 500 mg bid Current orders for Inpatient glycemic control:  Levemir 20 units bid, Novolog 7 units q 4 hours, Novolog resistant q 4 hours  Inpatient Diabetes Program Recommendations:   Please consider: -Increase Levemir to 25 units bid -Increase Novolog to 10 units q 4 hrs. OR  Restart IV insulin.  Thank you, Nani Gasser. Carney Saxton, RN, MSN, CDE  Diabetes Coordinator Inpatient Glycemic Control Team Team Pager [phone] (8am-5pm) 10/11/2018 10:30 AM

## 2018-10-11 NOTE — Progress Notes (Signed)
[place] for Heparin Indication: atrial fibrillation  Allergies  Allergen Reactions  . Doxycycline     REACTION: Wheals and pruritus    Patient Measurements: Height: 5' (152.4 cm) Weight: 216 lb 11.4 oz (98.3 kg) IBW/kg (Calculated) : 45.5  HEPARIN DW (KG): 71.8   Vital Signs: Temp: 97.7 F (36.5 C) (06/16 1136) Temp Source: Axillary (06/16 1136) BP: 108/56 (06/16 1245) Pulse Rate: 54 (06/16 1245)  Labs: Recent Labs    10/09/18 0308  10/10/18 0418 10/10/18 0423 10/10/18 1637 10/10/18 1851 10/11/18 0418  HGB 9.2*   < > 9.5* 11.2*  --   --  11.5*  HCT 31.5*   < > 30.7* 33.0*  --   --  38.2  PLT 149*  --  124*  --   --   --  170  HEPARINUNFRC 0.25*  --  0.14*  --   --  0.32 0.33  CREATININE 1.96*   < > 1.97*  --  1.69*  --  1.65*   < > = values in this interval not displayed.    Estimated Creatinine Clearance: 35.3 mL/min (A) (by C-G formula based on SCr of 1.65 mg/dL (H)).  Assessment: Pt is a 67yoF with a history of afib. She was on Eliquis PTA, received in hospital, last dose 5/29. Apixaban on hold for potential need for [facility], currently on CRRT. Pharmacy consulted to dose heparin  Heparin level remains therapeutic at 0.33 on heparin 1200 units/hr. CBC stable, no issues with infusion or bleeding noted per nursing    Goal of Therapy:  Heparin level 0.3-0.7 units/ml  Monitor platelets by anticoagulation protocol: Yes   Plan:  -Increase heparin slightly to 1250 units/hr to ensure remains therapeutic -Heparin level and CBC daily -Follow plans to transition to [place] once extubated   Claiborne Billings, PharmD PGY2 Cardiology Pharmacy Resident Phone [phone] Please check AMION for all Pharmacist numbers by unit 10/11/2018 12:59 PM

## 2018-10-11 NOTE — Progress Notes (Signed)
OT Cancellation Note  Patient Details Name: Kathleen [name] MRN: [id number] DOB: [birth year]   Cancelled Treatment:    Reason Eval/Treat Not Completed: Medical issues which prohibited therapy. Pt remains intubated and on CRRT.  OT will sign off, please re-consult when medically appropriate.   [name], OT Acute Rehabilitation Services Pager [phone] Office [phone]   [name] 10/11/2018, 12:16 PM

## 2018-10-11 NOTE — Progress Notes (Signed)
NAME:  Kathleen [name], MRN:  [id number], DOB:  September 29, 1950, LOS: 52 ADMISSION DATE:  09/22/2018, CONSULTATION DATE: 09/24/2018 REFERRING MD: Candee Furbish MD, CHIEF COMPLAINT: Cardiac arrest  Brief History   68 y/o obese woman with chronic diastolic heart failure who failed outpatient diuretics management due to chronic kidney disease.  Right heart cath on 5/29 showed markedly elevated biventricular filling pressures with normal cardiac output.  She was on cardiology service, maintained on milrinone and Lasix.  On the morning of 5/30 she went into PEA arrest, CPR performed for 8 minutes.  Intubated and transferred to ICU.  Started on CRRT 5/31.  Extubated 6/3.  Re-intubated early am 6/11 for hypercarbic respiratory failure, possible RUL PNA.  Past Medical History   has a past medical history of Atrial flutter ([place]), Bradycardia, Chronic diastolic (congestive) heart failure ([place]) (01/10/2015), CKD (chronic kidney disease), stage IV ([place]), Degenerative joint disease of hand, Diabetes mellitus, Dyslipidemia, Fecal occult blood test positive, GERD (gastroesophageal reflux disease), Headache, Hypertension, Inadequate material resources, Irritable bowel syndrome, Morbid obesity ([place]), Obesity hypoventilation syndrome ([place]), Post-menopausal bleeding, and Shortness of breath dyspnea.  Significant Hospital Events   5/28 Admit 5/29 RHC 5/30 PEA arrest, intubated/ CRRT 5/31 Milrinone stopped due to ectopy; brady episode- amio stopped 6/02 Attempt at SBT, chest x-ray with worsening pulmonary edema, CVVHD for volume removal 6/03 Extubated > remain on CRRT.  Refused nocturnal BiPAP 6/04 Refused nocturnal BiPAP 6/11 Intubated early am with hypercarbia, concern for RUL PNA  Consults:  Cardiology PCCM  Nephrology   Procedures:  Lt PICC 5/29 >> OETT 5/30 >> 6/3 R IJ HD cath 5/30 >> Aline left ulnar 5/30 >> 6/2 ETT 6/11 >>   Significant Diagnostic Tests:  TTE 5/28 >>The left ventricle has normal systolic  function with an ejection fraction of 60-65%. The cavity size was normal. Left ventricular diastolic doppler parameters are indeterminate. No evidence of left ventricular regional wall motion abnormalities. The right ventricle has normal systolic function. The cavity was mildly enlarged. There is no increase in right ventricular wall thickness. Right atrial size was mildly dilated. The aortic valve is tricuspid. Mild thickening of the aortic valve. Mild calcification of the aortic valve. Aortic valve regurgitation is trivial by color flow Doppler.The aortic root is normal in size and structure.  Right heart cath 5/29 RA = 24 RV = 94/26 PA = 95/36 (53) PCW = 30 (v = 50) Fick cardiac output/index = 6.0/2.7 PVR =3.9 WU FA sat = 91% PA sat = 58%, 58% SVC 60%  CXR 6/15> Bilateral ASD LLL > R. ETT, GT, LIJ and RIJ CVCs stable position.   Micro Data:  SARS coronavirus 2 cepheid 5/28 >> neg MRSA PCR 5/28 >> neg Trach aspirate 6/2 >> MSSA BCx2 6/2 >> negative Tracheal aspirate 6/11 >> few GNR >> BCx2 6/11 >>  Antimicrobials:  Vancomycin 6/3 > 6/4 Cefazolin 6/4 >> 6/10 Vanco 6/11 >>  Cefepime 6/11 >>  Interim history/subjective:  No events overnight, weaning on high PS  Objective   Blood pressure 113/63, pulse (!) 50, temperature 98.5 F (36.9 C), temperature source Oral, resp. rate 18, height 5' (1.524 m), weight 98.3 kg, SpO2 95 %. CVP:  [4 mmHg-7 mmHg] 4 mmHg  Vent Mode: CPAP;PSV FiO2 (%):  [40 %] 40 % Set Rate:  [18 bmp] 18 bmp Vt Set:  [420 mL] 420 mL PEEP:  [5 cmH20] 5 cmH20 Pressure Support:  [10 cmH20] 10 cmH20 Plateau Pressure:  [12 cmH20-21 cmH20] 20 cmH20  Intake/Output Summary (Last 24 hours) at 10/11/2018 1093 Last data filed at 10/11/2018 0900 Gross per 24 hour  Intake 2424.12 ml  Output 4725 ml  Net -2300.88 ml   Filed Weights   10/09/18 0309 10/10/18 0300 10/11/18 0405  Weight: 103 kg 101.3 kg 98.3 kg   Examination: General: Chronically ill appearing  female, NAD HEENT: [place]/AT, PERRL, EOM-I and MMM, ETT in place Neuro: Arousable, moving ext spontaneously but not command CV: Sinus brady PULM: Symmetrical chest expansion. L>R rhonchi. Synchronous with vent  GI: Obese, soft, NT, ND and +BS Extremities: Symmetrical bulk and tone, no cyanosis no clubbing Skin: Clean, dry, warm, without rash   I reviewed CXR myself, improving edema, ETT is in a good position.  Resolved Hospital Problem list     Assessment & Plan:   PEA arrest  -suspect hypoxia driven Acute on chronic diastolic heart failure -admit with elevated filling pressures, cardiorenal syndrome  Pulmonary hypertension, severe  -s/p RHC, WHO class 2,3 Permanent AF/AFl -on anticoagulation pre admit P: Tele monitoring Continue stress dose steroids while in pressors SBP goal > 90, titrate NE for goal Midodrine 10 mg TID   CRRT per nephrology down to -25 ml/hr  Acute on Chronic Hypoxemic / Hypercarbic Respiratory Failure  -extubated 6/3, refused bipap, reintubated 6/11 for AMS, hypercarbia  -multifactorial, initially volume overload, tracheal aspirate + for MSSA, new RUL infiltrate 6/11  OSA / OHS  -not compliant with BiPAP while inpatient  P: Begin high PS trials but no extubation given hemodynamics and mental status AM WUA/SBT Adjust PEEP/FiO2 for SpO2 >90% Continue volume negative -25 ml/hr AM CXR Anticipate will need tracheostomy likely Monday if no improvement  Acute encephalopathy -CNS depressing medications vs icu delirium vs sepsis P Seroquel 50mg  BID Clonazepam 1mg  BID ICU delirium precaution RN intervention Abx as above  MSSA PNA / RUL Infiltrate / Possible Aspiration  P: Broad spectrum ABX as above, continue given shock   NSVT/VT -off amio due to bradycardia P: Per Cardiology  Goal K >4, Mg >2  Continue telemetry   Acute on CKD -component of cardiorenal disease  Hypophosphatemia  P: Nephrology managing CRRT, appreciate assistance Normalize  electrolyte abnormalities as indicated Minimize nephrotoxic medications MAP goal > 65 for adequate renal perfusion Renal function panel   DM II with ongoing hyperglycemia -in setting critical illness  P: Resistant SSI Lantus to 20 units QD TF coverage 4 units Q4 DM coordinator consult, would like to avoid insulin gtt if possible   Morbid Obesity, malnourished P: TF  Best practice:  Diet: EN  Pain/Anxiety/Delirium protocol (if indicated):  In place  VAP protocol (if indicated): In place  DVT prophylaxis: Heparin gtt GI prophylaxis: Pepcid Glucose control: SSI Mobility: Bed Code Status: Full Family Communication: Pending  Disposition: ICU  The patient is critically ill with multiple organ systems failure and requires high complexity decision making for assessment and support, frequent evaluation and titration of therapies, application of advanced monitoring technologies and extensive interpretation of multiple databases.   Critical Care Time devoted to patient care services described in this note is  33  Minutes. This time reflects time of care of this signee Dr Jennet Maduro. This critical care time does not reflect procedure time, or teaching time or supervisory time of PA/NP/Med student/Med Resident etc but could involve care discussion time.  Rush Farmer, M.D. [facility] Pulmonary/Critical Care Medicine. Pager: [phone]. After hours pager: [phone].

## 2018-10-11 NOTE — Progress Notes (Signed)
RT Note:  RN called and said patient was desatting.  Put FIO2 up to 70% and re-suctioned patient, got copious amounts of secretions up.  Changed HME which was loaded with secretions as well.  Re-suctioned patient again and got more secretions.  Patient sats are now at 96%.  Will continue to monitor patient and see if we can wean FIO2 back down.

## 2018-10-11 NOTE — Progress Notes (Signed)
Subjective: Interval History: has no complaint, on vent, but ^ pressors..  Objective: Vital signs in last 24 hours: Temp:  [97.4 F (36.3 C)-99.4 F (37.4 C)] 98.5 F (36.9 C) (06/16 0723) Pulse Rate:  [39-55] 53 (06/16 0745) Resp:  [13-25] 17 (06/16 0745) BP: (69-136)/(38-72) 105/60 (06/16 0745) SpO2:  [89 %-100 %] 97 % (06/16 0819) Arterial Line BP: (65-121)/(33-81) 94/49 (06/16 0745) FiO2 (%):  [40 %] 40 % (06/16 0819) Weight:  [98.3 kg] 98.3 kg (06/16 0405) Weight change: -3 kg  Intake/Output from previous day: 06/15 0701 - 06/16 0700 In: 2427.7 [I.V.:636.3; NG/GT:1130; IV Piggyback:661.4] Out: 4615  Intake/Output this shift: Total I/O In: 34.6 [I.V.:34.6] Out: 154 [Other:154]  General appearance: moderately obese, pale and awakens, and seems to understand Neck: IJ temp cath Resp: diminished breath sounds bilaterally and rales bibasilar Cardio: S1, S2 normal and systolic murmur: holosystolic 2/6, blowing at apex GI: obese, pos bs,  Extremities: edema 2-3+  Lab Results: Recent Labs    10/10/18 0418 10/10/18 0423 10/11/18 0418  WBC 13.1*  --  17.6*  HGB 9.5* 11.2* 11.5*  HCT 30.7* 33.0* 38.2  PLT 124*  --  170   BMET:  Recent Labs    10/10/18 1637 10/11/18 0418  NA 135 134*  K 4.4 4.6  CL 102 100  CO2 21* 22  GLUCOSE 307* 308*  BUN 44* 42*  CREATININE 1.69* 1.65*  CALCIUM 8.4* 8.6*   No results for input(s): PTH in the last 72 hours. Iron Studies: No results for input(s): IRON, TIBC, TRANSFERRIN, FERRITIN in the last 72 hours.  Studies/Results: Dg Chest Port 1 View  Result Date: 10/11/2018 CLINICAL DATA:  Respiratory failure.  Intubated patient. EXAM: PORTABLE CHEST 1 VIEW COMPARISON:  Single-view of the chest 10/10/2018 and 10/09/2018. FINDINGS: Support tubes and lines are unchanged. Extensive bilateral airspace disease persists but has improved over the past 2 days. Heart size is enlarged. No pneumothorax. Small left pleural effusion noted. No  acute bony abnormality. IMPRESSION: Support tubes and lines are unchanged and project in good position. Improved bilateral airspace disease. Electronically Signed   By: Inge Rise M.D.   On: 10/11/2018 08:26   Dg Chest Port 1 View  Result Date: 10/10/2018 CLINICAL DATA:  ETT present,resp failure EXAM: PORTABLE CHEST - 1 VIEW COMPARISON:  10/09/2018 FINDINGS: Endotracheal tube, feeding tube, left IJ central venous catheter, and tunneled right IJ hemodialysis catheter stable in position. Bilateral patchy interstitial and airspace opacities with a predominantly perihilar and left lower lung distribution as before. Heart size and mediastinal contours are within normal limits. No effusion. No pneumothorax. Visualized bones unremarkable. IMPRESSION: 1. Stable asymmetric infiltrates or edema. 2. Support hardware stable in position. Electronically Signed   By: Lucrezia Europe M.D.   On: 10/10/2018 07:52    I have reviewed the patient's current medications.  Assessment/Plan: 1 AKI.CKD  Oliguric ATN, vol xs but lower bps, lower UF and see if can lower pressors.  Good solute/acid/base/K 2 Anemia stable 3 CAD per cards 4 VDRF O2 ok. Per CCM 5 Obesity 6 DM per primary P lower Net neg, vent per CCM, lower pressors, cont TF    LOS: 19 days   Jeneen Rinks Labron Bloodgood 10/11/2018,8:32 AM

## 2018-10-11 NOTE — Progress Notes (Addendum)
Nutrition Follow-up  DOCUMENTATION CODES:   Morbid obesity  INTERVENTION:   -Monitor for BM results (day 7 without) -ContinueB-complex with Vit C -Monitor CBGs   Continue Tube Feeding:  -Vital 1.5 @ 30 ml (720 ml) via Cortrak -60 ml Prostat BID  Provides: 1480 kcal, 109 grams protein, 550 ml free water. Meets 107% of kcal needs and 100% of protein needs.   NUTRITION DIAGNOSIS:   Inadequate oral intake related to inability to eat as evidenced by NPO status.  Ongoing  GOAL:   Provide needs based on ASPEN/SCCM guidelines  Addressed via TF  MONITOR:   Vent status, Labs, Weight trends, I & O's, TF tolerance  REASON FOR ASSESSMENT:   Consult Enteral/tube feeding initiation and management  ASSESSMENT:   68 year old with morbid obesity, acute on chronic diastolic heart failure, pulmonary hypertension transferred to the ICU after PEA arrest.  Suspect respiratory failure due to pulmonary edema   5/30 - s/p PEA arrest, intubated and transferred to ICU,CRRTinitiated 6/2- failed SBT, pulm edema 6/3 extubated, refused BiPAP 6/10- CRRT stopped  6/11- re-intubated, CRRT re-started   Pt discussed during ICU rounds and with RN.   Mentation and hemodynamics remain as barrier to extubation. Possible trach Monday if no improvement. CRRT down to -25 ml/hr. CBGs continue to be elevated, diabetes coordination to make adjustments. No BM in 7 days. Colace given this am. Continue current TF regimen.   Admission weight: 134.8 kg Current weight: 98.3 kg    Patient remains intubated on ventilator support MV: 8.2 L/min Temp (24hrs), Avg:98.4 F (36.9 C), Min:97.4 F (36.3 C), Max:99.4 F (37.4 C)   I/O: -14,942 ml since 6/2 UOP: remains anuric  CRRT: 4,615 ml x 24 hrs   Drips: levophed Medications: solucortef, SS novolog, levemir, midodrine Labs: Na 134 (L) Mg 2.7 (H) CBG 258-565  Diet Order:   Diet Order    None      EDUCATION NEEDS:   No education needs have  been identified at this time  Skin:  Skin Assessment: Reviewed RN Assessment(MASD to breast, abdomen) Skin Integrity Issues:: Incisions, Other (Comment) Incisions: righ neck/chest Other: blisters to hip  Last BM:  6/9  Height:   Ht Readings from Last 1 Encounters:  10/05/18 5' (1.524 m)    Weight:   Wt Readings from Last 1 Encounters:  10/11/18 98.3 kg    Ideal Body Weight:  45.5 kg  BMI:  Body mass index is 42.32 kg/m.  Estimated Nutritional Needs:   Kcal:  9924-2683 kcal  Protein:  100-120 grams  Fluid:  >/= 1.4 L/day   Mariana Single RD, LDN Clinical Nutrition Pager # - [phone]

## 2018-10-11 NOTE — Progress Notes (Signed)
[name] for Heparin Indication: atrial fibrillation  Allergies  Allergen Reactions  . Doxycycline     REACTION: Wheals and pruritus    Kathleen [name] Measurements: Height: 5' (152.4 cm) Weight: 216 lb 11.4 oz (98.3 kg) IBW/kg (Calculated) : 45.5  Vital Signs: Temp: 97.7 F (36.5 C) (06/16 1136) Temp Source: Axillary (06/16 1136) BP: 98/54 (06/16 1215) Pulse Rate: 60 (06/16 1215)  Labs: Recent Labs    10/09/18 0308  10/10/18 0418 10/10/18 0423 10/10/18 1637 10/10/18 1851 10/11/18 0418  HGB 9.2*   < > 9.5* 11.2*  --   --  11.5*  HCT 31.5*   < > 30.7* 33.0*  --   --  38.2  PLT 149*  --  124*  --   --   --  170  HEPARINUNFRC 0.25*  --  0.14*  --   --  0.32 0.33  CREATININE 1.96*   < > 1.97*  --  1.69*  --  1.65*   < > = values in this interval not displayed.    Estimated Creatinine Clearance: 35.3 mL/min (A) (by C-G formula based on SCr of 1.65 mg/dL (H)).  Assessment: Pt is a 67yoF with a history of afib. She was on Eliquis PTA, received in hospital, last dose 5/29. Apixaban on hold for potential need for [facility], currently on CRRT. Pharmacy consulted to dose heparin  Heparin level 0.33 this morning on 1000 units/hr. CBC stable, no issues with infusion or bleeding noted per nursing    Goal of Therapy:  Heparin level 0.3-0.7 units/ml  Monitor platelets by anticoagulation protocol: Yes   Plan:  Continue heparin at 1250 units/hr -Heparin level and CBC daily  [name] PharmD., BCPS Clinical Pharmacist 10/11/2018 12:45 PM

## 2018-10-11 NOTE — Progress Notes (Addendum)
Patient ID: Kathleen [name], female   DOB: 1950-11-03, 68 y.o.   MRN: [id number]    Advanced Heart Failure Rounding Note   Subjective:     Remains intubated. Awake but not following commands.   CVP down to 3-4. NE up to 16-20 CVVHD turned down to -25.   HR 50-60s. Back in NSR today  Objective:   Weight Range:  Vital Signs:   Temp:  [97.4 F (36.3 C)-99.4 F (37.4 C)] 98.5 F (36.9 C) (06/16 0723) Pulse Rate:  [39-55] 53 (06/16 0745) Resp:  [13-25] 17 (06/16 0745) BP: (69-136)/(38-72) 105/60 (06/16 0745) SpO2:  [90 %-100 %] 97 % (06/16 0819) Arterial Line BP: (65-121)/(33-81) 94/49 (06/16 0745) FiO2 (%):  [40 %] 40 % (06/16 0819) Weight:  [98.3 kg] 98.3 kg (06/16 0405) Last BM Date: 10/03/18  Weight change: Filed Weights   10/09/18 0309 10/10/18 0300 10/11/18 0405  Weight: 103 kg 101.3 kg 98.3 kg    Intake/Output:   Intake/Output Summary (Last 24 hours) at 10/11/2018 0845 Last data filed at 10/11/2018 0800 Gross per 24 hour  Intake 2415.38 ml  Output 4769 ml  Net -2353.62 ml     Physical Exam: General:  On vent. Awake but not following commands.  HEENT: normal + ETT Neck: supple. no JVD. Carotids 2+ bilat; no bruits. No lymphadenopathy or thryomegaly appreciated. Cor: PMI nondisplaced. Regular rate & rhythm. No rubs, gallops or murmurs. Lungs: coarse R>L Abdomen: obese soft, nontender, nondistended. No hepatosplenomegaly. No bruits or masses. Good bowel sounds. Extremities: no cyanosis, clubbing, rash, tr edema + boots Neuro: On vent. Awake but not following commands.   Telemetry: NSR 50-60. Personally reviewed   Labs: Basic Metabolic Panel: Recent Labs  Lab 10/07/18 0310  10/08/18 0403  10/09/18 0308  10/09/18 1549 10/10/18 0418 10/10/18 0423 10/10/18 1637 10/11/18 0418  NA 136   137   < > 132*   < > 134*   < > 132* 134* 136 135 134*  K 4.6   4.6   < > 5.7*   < > 4.3   < > 4.0 4.2 4.2 4.4 4.6  CL 105   106   < > 100   < > 99  --  100 101  --  102  100  CO2 21*   23   < > 23   < > 24  --  24 23  --  21* 22  GLUCOSE 227*   238*   < > 327*   < > 343*  --  363* 328*  --  307* 308*  BUN 34*   36*   < > 33*   < > 39*  --  41* 46*  --  44* 42*  CREATININE 2.05*   1.99*   < > 1.73*   < > 1.96*  --  1.91* 1.97*  --  1.69* 1.65*  CALCIUM 7.4*   7.4*   < > 8.0*   < > 8.3*  --  8.1* 8.4*  --  8.4* 8.6*  MG 2.4  --  2.8*  --  2.8*  --   --  2.8*  --   --  2.7*  PHOS 2.1*   2.1*   < > 2.2*   < > 2.1*  --  2.3* 2.2*   2.2*  --  3.7 2.8   < > = values in this interval not displayed.    Liver Function Tests: Recent Labs  Lab 10/09/18 1549 10/10/18  0932 10/10/18 1043 10/10/18 1637 10/11/18 0418  AST  --   --  33  --   --   ALT  --   --  7  --   --   ALKPHOS  --   --  120  --   --   BILITOT  --   --  1.1  --   --   PROT  --   --  7.6  --   --   ALBUMIN 1.9* 2.0* 2.1* 2.2* 2.4*   No results for input(s): LIPASE, AMYLASE in the last 168 hours. Recent Labs  Lab 10/10/18 1043  AMMONIA 39*    CBC: Recent Labs  Lab 10/07/18 0310 10/08/18 0403 10/09/18 0308 10/09/18 0445 10/10/18 0418 10/10/18 0423 10/11/18 0418  WBC 23.2* 13.9* 14.8*  --  13.1*  --  17.6*  HGB 9.4* 8.9* 9.2* 10.9* 9.5* 11.2* 11.5*  HCT 32.8* 30.4* 31.5* 32.0* 30.7* 33.0* 38.2  MCV 87.5 88.4 87.3  --  87.5  --  87.2  PLT 129* 85* 149*  --  124*  --  170    Cardiac Enzymes: No results for input(s): CKTOTAL, CKMB, CKMBINDEX, TROPONINI in the last 168 hours.  BNP: BNP (last 3 results) Recent Labs    09/22/18 1420  BNP 451.2*    ProBNP (last 3 results) No results for input(s): PROBNP in the last 8760 hours.    Other results:  Imaging: Dg Chest Port 1 View  Result Date: 10/11/2018 CLINICAL DATA:  Respiratory failure.  Intubated patient. EXAM: PORTABLE CHEST 1 VIEW COMPARISON:  Single-view of the chest 10/10/2018 and 10/09/2018. FINDINGS: Support tubes and lines are unchanged. Extensive bilateral airspace disease persists but has improved over the past 2  days. Heart size is enlarged. No pneumothorax. Small left pleural effusion noted. No acute bony abnormality. IMPRESSION: Support tubes and lines are unchanged and project in good position. Improved bilateral airspace disease. Electronically Signed   By: Inge Rise M.D.   On: 10/11/2018 08:26   Dg Chest Port 1 View  Result Date: 10/10/2018 CLINICAL DATA:  ETT present,resp failure EXAM: PORTABLE CHEST - 1 VIEW COMPARISON:  10/09/2018 FINDINGS: Endotracheal tube, feeding tube, left IJ central venous catheter, and tunneled right IJ hemodialysis catheter stable in position. Bilateral patchy interstitial and airspace opacities with a predominantly perihilar and left lower lung distribution as before. Heart size and mediastinal contours are within normal limits. No effusion. No pneumothorax. Visualized bones unremarkable. IMPRESSION: 1. Stable asymmetric infiltrates or edema. 2. Support hardware stable in position. Electronically Signed   By: Lucrezia Europe M.D.   On: 10/10/2018 07:52     Medications:     Scheduled Medications:  atorvastatin  40 mg Oral q1800   chlorhexidine gluconate (MEDLINE KIT)  15 mL Mouth Rinse BID   Chlorhexidine Gluconate Cloth  6 each Topical Daily   clonazePAM  1 mg Per Tube BID   feeding supplement (PRO-STAT SUGAR FREE 64)  30 mL Per Tube QID   feeding supplement (VITAL 1.5 CAL)  1,000 mL Per Tube Q24H   hydrocortisone sodium succinate  50 mg Intravenous Q6H   insulin aspart  0-20 Units Subcutaneous Q4H   insulin aspart  7 Units Subcutaneous Q4H   insulin detemir  20 Units Subcutaneous BID   mouth rinse  15 mL Mouth Rinse 10 times per day   midodrine  10 mg Oral TID WC   QUEtiapine  50 mg Oral BID    Infusions:   prismasol  BGK 4/2.5 500 mL/hr at 10/10/18 2211    prismasol BGK 4/2.5 300 mL/hr at 10/11/18 0803   sodium chloride     sodium chloride Stopped (10/11/18 0036)   ceFEPime (MAXIPIME) IV Stopped (10/11/18 0029)   famotidine (PEPCID) IV  Stopped (10/10/18 1450)   fentaNYL infusion INTRAVENOUS 50 mcg/hr (10/11/18 0800)   heparin 1,200 Units/hr (10/11/18 0800)   norepinephrine (LEVOPHED) Adult infusion 18 mcg/min (10/11/18 0800)   prismasol BGK 4/2.5 1,500 mL/hr at 10/11/18 0509   sodium chloride 999 mL/hr at 10/06/18 1018   vancomycin Stopped (10/10/18 1914)    PRN Medications: Place/Maintain arterial line **AND** sodium chloride, sodium chloride, acetaminophen (TYLENOL) oral liquid 160 mg/5 mL, bisacodyl, docusate, heparin, midazolam, ondansetron, sodium chloride, sodium chloride flush   Assessment/Plan:   1. PEA arrest on 5/30 - due to hypoxia/pulmonary edema. - now stable  2. Acute on chronic hypoxic respiratory failure - has underlying  OHS/OSA  - intubated on 5/30 in setting of arrest.  - treated for MSSA PNA - extubated 6/3.  - reintubated 6/11 with worsening RUL aispace disease.  - abx broadened to cefipime/vanc.  - PNA and underlying OHS likely main barriers to extubation. Volume status currently looks good. Would continue with current treatment and hopefully we can give her one more shot at extubation before considering trach but mental status may not permit this. Management per CCM. With CVP 3 will not gain any more ground with volume removal  3.  Acute on chronic diastolic HF - Admitted with markedly elevated filling pressures complicated by severe cardiorenal syndrome - Now down 80 pounds with CVVHD. Suspect this as likely as good as we can get her volume status.  - Anuric.Suspect she may progress to long-term HD - Volume management per Renal - ? Need to w/u for possible TTR amyloid  4. Pulmonary HTN, severe  - this is mostly pulmonary venous HTN by cath [facility] 2) but also has a component of OHS/OSA (WHO group 3) - no role for selective pulmonary vasodilators - will improve significantly with volume removal - consider repeat RHC after volume removal as tolerated   5. AKI on CKD 3 -  baseline creatinine 1.9 -> ESRD - likely cardiorenal - We have suspected that she may progress to long-term HD. Remains anuric - Remains on CVVHD. Appreciate Dr. Deterding's management  6. Morbid obesity - needs weight loss  6. Chronic AF/AFL - Off apixaban. Now on heparin. No bleeding.  Discussed dosing with PharmD personally. - Rhythm looks to be back in NSR - No role for PPM currently  7. VT/NSVT - Quiescent.  off amio due to bradycardia - Keep K> 4.0 Mg 2.0  8. MSSA PNA - Bcx drawn 6/2 & 6/11. NGTD - sputum cx with MSSA PNA - CXR with worsening RUL PNA. Cefazolin switched to cefipime/vanc  9. Hypotension - ?  Due to sepsis.  - Now on midodrine 10 tid and NE 15-20. Wean as tolerated  Have d/w CCM service who will take over primary management.  We will continue to follow at a distance to keep an eye on her HR  CRITICAL CARE Performed by: Glori Bickers  Total critical care time: 35 minutes  Critical care time was exclusive of separately billable procedures and treating other patients.  Critical care was necessary to treat or prevent imminent or life-threatening deterioration.  Critical care was time spent personally by me (independent of midlevel providers or residents) on the following activities: development of treatment plan with  patient and/or surrogate as well as nursing, discussions with consultants, evaluation of patient's response to treatment, examination of patient, obtaining history from patient or surrogate, ordering and performing treatments and interventions, ordering and review of laboratory studies, ordering and review of radiographic studies, pulse oximetry and re-evaluation of patient's condition.    Length of Stay: 19  Glori Bickers MD 10/11/2018, 8:45 AM  Advanced Heart Failure Team Pager [phone] (M-F; [facility])  Please contact [facility] for night-coverage after hours (4p -7a ) and weekends on [email or web address]

## 2018-10-12 ENCOUNTER — Inpatient Hospital Stay (HOSPITAL_COMMUNITY): Payer: Medicare HMO

## 2018-10-12 LAB — GLUCOSE, CAPILLARY
Glucose-Capillary: 258 mg/dL — ABNORMAL HIGH (ref 70–99)
Glucose-Capillary: 220 mg/dL — ABNORMAL HIGH (ref 70–99)
Glucose-Capillary: 237 mg/dL — ABNORMAL HIGH (ref 70–99)
Glucose-Capillary: 254 mg/dL — ABNORMAL HIGH (ref 70–99)
Glucose-Capillary: 186 mg/dL — ABNORMAL HIGH (ref 70–99)

## 2018-10-12 LAB — CBC
HCT: 38.9 % (ref 36.0–46.0)
Hemoglobin: 12.2 g/dL (ref 12.0–15.0)
MCH: 27.7 pg (ref 26.0–34.0)
MCHC: 31.4 g/dL (ref 30.0–36.0)
MCV: 88.4 fL (ref 80.0–100.0)
Platelets: DECREASED 10*3/uL (ref 150–400)
RBC: 4.4 MIL/uL (ref 3.87–5.11)
RDW: 23.4 % — ABNORMAL HIGH (ref 11.5–15.5)
WBC: 22.9 10*3/uL — ABNORMAL HIGH (ref 4.0–10.5)
nRBC: 2.6 % — ABNORMAL HIGH (ref 0.0–0.2)

## 2018-10-12 LAB — POCT I-STAT 7, (LYTES, BLD GAS, ICA,H+H)
Acid-base deficit: 1 mmol/L (ref 0.0–2.0)
Bicarbonate: 24.6 mmol/L (ref 20.0–28.0)
Calcium, Ion: 1.18 mmol/L (ref 1.15–1.40)
HCT: 46 % (ref 36.0–46.0)
Hemoglobin: 15.6 g/dL — ABNORMAL HIGH (ref 12.0–15.0)
O2 Saturation: 99 %
Potassium: 4.4 mmol/L (ref 3.5–5.1)
Sodium: 136 mmol/L (ref 135–145)
TCO2: 26 mmol/L (ref 22–32)
pCO2 arterial: 42.7 mmHg (ref 32.0–48.0)
pH, Arterial: 7.368 (ref 7.350–7.450)
pO2, Arterial: 126 mmHg — ABNORMAL HIGH (ref 83.0–108.0)

## 2018-10-12 LAB — RENAL FUNCTION PANEL
Albumin: 2.6 g/dL — ABNORMAL LOW (ref 3.5–5.0)
Albumin: 2.5 g/dL — ABNORMAL LOW (ref 3.5–5.0)
Anion gap: 11 (ref 5–15)
Anion gap: 12 (ref 5–15)
BUN: 44 mg/dL — ABNORMAL HIGH (ref 8–23)
BUN: 46 mg/dL — ABNORMAL HIGH (ref 8–23)
CO2: 21 mmol/L — ABNORMAL LOW (ref 22–32)
CO2: 19 mmol/L — ABNORMAL LOW (ref 22–32)
Calcium: 8.9 mg/dL (ref 8.9–10.3)
Calcium: 8.3 mg/dL — ABNORMAL LOW (ref 8.9–10.3)
Chloride: 102 mmol/L (ref 98–111)
Chloride: 102 mmol/L (ref 98–111)
Creatinine, Ser: 1.69 mg/dL — ABNORMAL HIGH (ref 0.44–1.00)
Creatinine, Ser: 1.76 mg/dL — ABNORMAL HIGH (ref 0.44–1.00)
GFR calc Af Amer: 36 mL/min — ABNORMAL LOW (ref >60)
GFR calc Af Amer: 34 mL/min — ABNORMAL LOW (ref >60)
GFR calc non Af Amer: 31 mL/min — ABNORMAL LOW (ref >60)
GFR calc non Af Amer: 30 mL/min — ABNORMAL LOW (ref >60)
Glucose, Bld: 267 mg/dL — ABNORMAL HIGH (ref 70–99)
Glucose, Bld: 260 mg/dL — ABNORMAL HIGH (ref 70–99)
Phosphorus: 2.8 mg/dL (ref 2.5–4.6)
Phosphorus: 2.8 mg/dL (ref 2.5–4.6)
Potassium: 4.6 mmol/L (ref 3.5–5.1)
Potassium: 4.3 mmol/L (ref 3.5–5.1)
Sodium: 134 mmol/L — ABNORMAL LOW (ref 135–145)
Sodium: 133 mmol/L — ABNORMAL LOW (ref 135–145)

## 2018-10-12 LAB — MAGNESIUM: Magnesium: 2.8 mg/dL — ABNORMAL HIGH (ref 1.7–2.4)

## 2018-10-12 LAB — POCT ACTIVATED CLOTTING TIME: Activated Clotting Time: 263 seconds

## 2018-10-12 LAB — IRON AND TIBC
Iron: 44 ug/dL (ref 28–170)
Saturation Ratios: 12 % (ref 10.4–31.8)
TIBC: 379 ug/dL (ref 250–450)
UIBC: 335 ug/dL

## 2018-10-12 LAB — HEPARIN LEVEL (UNFRACTIONATED): Heparin Unfractionated: 0.5 IU/mL (ref 0.30–0.70)

## 2018-10-12 MED ORDER — INSULIN ASPART 100 UNIT/ML ~~LOC~~ SOLN
10.0000 [IU] | SUBCUTANEOUS | Status: DC
  Administered 2018-10-12 – 2018-10-17 (×26): 10 [IU] via SUBCUTANEOUS

## 2018-10-12 MED ORDER — INSULIN DETEMIR 100 UNIT/ML ~~LOC~~ SOLN
25.0000 [IU] | Freq: Two times a day (BID) | SUBCUTANEOUS | Status: DC
  Administered 2018-10-12 – 2018-10-13 (×2): 25 [IU] via SUBCUTANEOUS
  Filled 2018-10-12 (×3): qty 0.25

## 2018-10-12 MED ORDER — SODIUM CHLORIDE 0.9 % IV BOLUS
500.0000 mL | Freq: Once | INTRAVENOUS | Status: AC
  Administered 2018-10-12: 500 mL via INTRAVENOUS

## 2018-10-12 NOTE — Progress Notes (Signed)
NAME:  Kathleen [name], MRN:  [id number], DOB:  Dec 08, 1950, LOS: 74 ADMISSION DATE:  09/22/2018, CONSULTATION DATE: 09/24/2018 REFERRING MD: Candee Furbish MD, CHIEF COMPLAINT: Cardiac arrest  Brief History   68 y/o obese woman with chronic diastolic heart failure who failed outpatient diuretics management due to chronic kidney disease.  Right heart cath on 5/29 showed markedly elevated biventricular filling pressures with normal cardiac output.  She was on cardiology service, maintained on milrinone and Lasix.  On the morning of 5/30 she went into PEA arrest, CPR performed for 8 minutes.  Intubated and transferred to ICU.  Started on CRRT 5/31.  Extubated 6/3.  Re-intubated early am 6/11 for hypercarbic respiratory failure, possible RUL PNA.  Past Medical History   has a past medical history of Atrial flutter ([place]), Bradycardia, Chronic diastolic (congestive) heart failure ([place]) (01/10/2015), CKD (chronic kidney disease), stage IV ([place]), Degenerative joint disease of hand, Diabetes mellitus, Dyslipidemia, Fecal occult blood test positive, GERD (gastroesophageal reflux disease), Headache, Hypertension, Inadequate material resources, Irritable bowel syndrome, Morbid obesity ([place]), Obesity hypoventilation syndrome ([place]), Post-menopausal bleeding, and Shortness of breath dyspnea.  Significant Hospital Events   5/28 Admit 5/29 RHC 5/30 PEA arrest, intubated/ CRRT 5/31 Milrinone stopped due to ectopy; brady episode- amio stopped 6/02 Attempt at SBT, chest x-ray with worsening pulmonary edema, CVVHD for volume removal 6/03 Extubated > remain on CRRT.  Refused nocturnal BiPAP 6/04 Refused nocturnal BiPAP 6/11 Intubated early am with hypercarbia, concern for RUL PNA 6/!6 - No events overnight, weaning on high PS 6?17 - failed vent wean  Consults:  Cardiology PCCM  Nephrology   Procedures:  Lt PICC 5/29 >> OETT 5/30 >> 6/3 R IJ HD cath 5/30 >> Aline left ulnar 5/30 >> 6/2 ETT 6/11 >>    Significant Diagnostic Tests:  TTE 5/28 >>The left ventricle has normal systolic function with an ejection fraction of 60-65%. The cavity size was normal. Left ventricular diastolic doppler parameters are indeterminate. No evidence of left ventricular regional wall motion abnormalities. The right ventricle has normal systolic function. The cavity was mildly enlarged. There is no increase in right ventricular wall thickness. Right atrial size was mildly dilated. The aortic valve is tricuspid. Mild thickening of the aortic valve. Mild calcification of the aortic valve. Aortic valve regurgitation is trivial by color flow Doppler.The aortic root is normal in size and structure.  Right heart cath 5/29 RA = 24 RV = 94/26 PA = 95/36 (53) PCW = 30 (v = 50) Fick cardiac output/index = 6.0/2.7 PVR =3.9 WU FA sat = 91% PA sat = 58%, 58% SVC 60%  CXR 6/15> Bilateral ASD LLL > R. ETT, GT, LIJ and RIJ CVCs stable position.   Micro Data:  SARS coronavirus 2 cepheid 5/28 >> neg MRSA PCR 5/28 >> neg Trach aspirate 6/2 >> MSSA BCx2 6/2 >> negative ...................... Tracheal aspirate 6/11 >> few GNR >> few proteus and few candida tropicalis BCx2 6/11 >>  Antimicrobials:  Vancomycin 6/3 > 6/4 Cefazolin 6/4 >> 6/10 ...........................Marland Kitchen Vanco 6/11 >> 6/17 Cefepime 6/11 >>  Interim history/subjective:    6/17 - no fever but wbc up. On stress dose steroids. On CVVH - weight down another 5 pounds per cards. Failed SBT. On levophed and midodrine. On vent. On fent gtt. RN says there is a right hip blister.   Objective   Blood pressure (!) 92/53, pulse 62, temperature 98.5 F (36.9 C), temperature source Oral, resp. rate (!) 21, height 5' (1.524 m), weight 96.1  kg, SpO2 98 %. CVP:  [1 mmHg-3 mmHg] 1 mmHg  Vent Mode: PSV;CPAP FiO2 (%):  [40 %-80 %] 50 % Set Rate:  [18 bmp] 18 bmp Vt Set:  [420 mL] 420 mL PEEP:  [5 cmH20] 5 cmH20 Pressure Support:  [8 cmH20-10 cmH20] 8 cmH20 Plateau  Pressure:  [18 cmH20-25 cmH20] 25 cmH20   Intake/Output Summary (Last 24 hours) at 10/12/2018 0958 Last data filed at 10/12/2018 0900 Gross per 24 hour  Intake 2390.02 ml  Output 2606 ml  Net -215.98 ml   Filed Weights   10/10/18 0300 10/11/18 0405 10/12/18 0240  Weight: 101.3 kg 98.3 kg 96.1 kg     General Appearance:  Looks criticall ill OBESE - + Head:  Normocephalic, without obvious abnormality, atraumatic Eyes:  PERRL - yes, conjunctiva/corneas - muddy     Ears:  Normal external ear canals, both ears Nose:  G tube - no Throat:  ETT TUBE - yes , OG tube - yes Neck:  Supple,  No enlargement/tenderness/nodules Lungs: Clear to auscultation bilaterally, Ventilator   Synchrony - no. 60-% fio2 Heart:  S1 and S2 normal, no murmur, CVP - no.  Pressors - levophed + . Rt subclavian HD cath + Abdomen:  Soft, no masses, no organomegaly Genitalia / Rectal:  Not done Extremities:  Extremities- intact Skin:  ntact in exposed areas . Sacral area - no decub per RN Neurologic:  Sedation - fent gtt -> RASS - -2/-3 . Moves all 4s - yes. CAM-ICU - cannot test . Orientation - cannot test due to sedation      LABS    PULMONARY Recent Labs  Lab 10/06/18 0854 10/06/18 1125 10/09/18 0445 10/10/18 0423 10/12/18 0433  PHART 7.261* 7.249* 7.379 7.384 7.368  PCO2ART 56.5* 53.0* 44.9 43.4 42.7  PO2ART 116.0* 131.0* 67.0* 69.0* 126.0*  HCO3 24.9 22.8 26.5 25.9 24.6  TCO2 27 24 28 27 26   O2SAT 97.0 98.0 92.0 93.0 99.0    CBC Recent Labs  Lab 10/10/18 0418  10/11/18 0418 10/12/18 0430 10/12/18 0433  HGB 9.5*   < > 11.5* 12.2 15.6*  HCT 30.7*   < > 38.2 38.9 46.0  WBC 13.1*  --  17.6* 22.9*  --   PLT 124*  --  170 PLATELET CLUMPS NOTED ON SMEAR, COUNT APPEARS DECREASED  --    < > = values in this interval not displayed.    COAGULATION No results for input(s): INR in the last 168 hours.  CARDIAC  No results for input(s): TROPONINI in the last 168 hours. No results for input(s):  PROBNP in the last 168 hours.   CHEMISTRY Recent Labs  Lab 10/08/18 0403  10/09/18 0308  10/09/18 1549 10/10/18 0418 10/10/18 0423 10/10/18 1637 10/11/18 0418 10/12/18 0430 10/12/18 0433  NA 132*   < > 134*   < > 132* 134* 136 135 134* 134* 136  K 5.7*   < > 4.3   < > 4.0 4.2 4.2 4.4 4.6 4.6 4.4  CL 100   < > 99  --  100 101  --  102 100 102  --   CO2 23   < > 24  --  24 23  --  21* 22 21*  --   GLUCOSE 327*   < > 343*  --  363* 328*  --  307* 308* 267*  --   BUN 33*   < > 39*  --  41* 46*  --  44* 42* 44*  --  CREATININE 1.73*   < > 1.96*  --  1.91* 1.97*  --  1.69* 1.65* 1.69*  --   CALCIUM 8.0*   < > 8.3*  --  8.1* 8.4*  --  8.4* 8.6* 8.9  --   MG 2.8*  --  2.8*  --   --  2.8*  --   --  2.7* 2.8*  --   PHOS 2.2*   < > 2.1*  --  2.3* 2.2*  2.2*  --  3.7 2.8 2.8  --    < > = values in this interval not displayed.   Estimated Creatinine Clearance: 34 mL/min (A) (by C-G formula based on SCr of 1.69 mg/dL (H)).   LIVER Recent Labs  Lab 10/10/18 0418 10/10/18 1043 10/10/18 1637 10/11/18 0418 10/12/18 0430  AST  --  33  --   --   --   ALT  --  7  --   --   --   ALKPHOS  --  120  --   --   --   BILITOT  --  1.1  --   --   --   PROT  --  7.6  --   --   --   ALBUMIN 2.0* 2.1* 2.2* 2.4* 2.6*     INFECTIOUS No results for input(s): LATICACIDVEN, PROCALCITON in the last 168 hours.   ENDOCRINE CBG (last 3)  Recent Labs    10/11/18 2314 10/12/18 0335 10/12/18 0756  GLUCAP 322* 258* 220*         IMAGING x48h  - image(s) personally visualized  -   highlighted in bold Dg Chest Port 1 View  Result Date: 10/12/2018 CLINICAL DATA:  Respiratory failure, ventilatory support EXAM: PORTABLE CHEST 1 VIEW COMPARISON:  10/11/2018 FINDINGS: Endotracheal tube 5.2 cm above the carina. Feeding tube enters the stomach with the tip not visualized on the film. Right IJ dialysis catheter tip mid SVC level. Left upper extremity PICC line tip proximal SVC level. Minimal  improvement in the diffuse airspace process versus edema. Patchy right mid lung and bibasilar opacities persist concerning for residual pneumonia. No large effusion or pneumothorax. Heart remains enlarged. IMPRESSION: Slight improvement in bilateral asymmetric airspace process versus edema. Stable support apparatus Electronically Signed   By: Jerilynn Mages.  Shick M.D.   On: 10/12/2018 08:14   Dg Chest Port 1 View  Result Date: 10/11/2018 CLINICAL DATA:  Respiratory failure.  Intubated patient. EXAM: PORTABLE CHEST 1 VIEW COMPARISON:  Single-view of the chest 10/10/2018 and 10/09/2018. FINDINGS: Support tubes and lines are unchanged. Extensive bilateral airspace disease persists but has improved over the past 2 days. Heart size is enlarged. No pneumothorax. Small left pleural effusion noted. No acute bony abnormality. IMPRESSION: Support tubes and lines are unchanged and project in good position. Improved bilateral airspace disease. Electronically Signed   By: Inge Rise M.D.   On: 10/11/2018 08:26     Resolved Hospital Problem list     Assessment & Plan:  ASSESSMENT / PLAN:  PULMONARY A:  Baseline hx - OSA/OHV - non copliant Current  - acute resp failure following cardiac arrest  10/12/2018 -> does not meet sBT criteria due to MODS  P:   Full vent support Needs trach to survice   NEUROLOGIC A:   Acute Encephalopathy + P:   Seroquel 50mg  BID Clonazepam 1mg  BID Fent gtt ICU delirium precaution RN intervention Abx as above     VASCULAR A:   Circulatory shock -  6/17 - aline not working   P:  midodrone Levophed Dc aline MAP goal > 65   CARDIAC A: PEA arrest : -suspect hypoxia driven Acute on chronic diastolic heart failure --admit with elevated filling pressures, cardiorenal syndrome  Pulmonary hypertension, severe : -s/p RHC, WHO class 2,3 Permanent AF/AFl - -on anticoagulation pre admit NSVT/VT - -off amio due to bradycardia P: Per cards/chf sevice    INFECTIOUS A:   MSSA PNA  6/2 Proteus VAP 6/11  P:   abx as above  RENAL A:  Cardiorenal syndrome  P:  crrt per renal  ELECTROLYTES A:  At risk for imbalance  P: Monitor and correct as needed   ENDOCRINE A:   Morbid OBestity DM  P:   ssi  HEMATOLOGIC A:  At risk anemia critical illness   P:  - PRBC for hgb </= 6.9gm%    - exceptions are   -  if ACS susepcted/confirmed then transfuse for hgb </= 8.0gm%,  or    -  active bleeding with hemodynamic instability, then transfuse regardless of hemoglobin value   At at all times try to transfuse 1 unit prbc as possible with exception of active hemorrhage    GI A:   Tolerting TF    P:   TF ppi  MSK/DERM RT hip blister - 6/17  P  - dressning    Best practice:  Diet: EN  Pain/Anxiety/Delirium protocol (if indicated):  In place  VAP protocol (if indicated): In place  DVT prophylaxis: Heparin gtt GI prophylaxis: Pepcid Glucose control: SSI Mobility: Bed Code Status: Full Family Communication: Pending  Disposition: ICU   ATTESTATION & SIGNATURE   The patient Kathleen [name] is critically ill with multiple organ systems failure and requires high complexity decision making for assessment and support, frequent evaluation and titration of therapies, application of advanced monitoring technologies and extensive interpretation of multiple databases.   Critical Care Time devoted to patient care services described in this note is  30  Minutes. This time reflects time of care of this signee Dr Brand Males. This critical care time does not reflect procedure time, or teaching time or supervisory time of PA/NP/Med student/Med Resident etc but could involve care discussion time     Dr. Brand Males, M.D., [facility].C.P Pulmonary and Critical Care Medicine Staff Physician [facility] Pager: [phone], If no answer or between  15:00h - 7:00h: call [phone]   10/12/2018 9:58 AM

## 2018-10-12 NOTE — Progress Notes (Signed)
Tried turning patient's FIO2 down to 50% due to sats being around 96%.  Patient started desating into the upper 29s.  Placed patient back on 60% to get sats back up.  Will continue to monitor patient.

## 2018-10-12 NOTE — Progress Notes (Signed)
Changed FIO2 on ventilator to 40% based upon ABG results as follows: Results for HULA, TASSO (MRN [id number]) as of 10/12/2018 06:11  Ref. Range 10/12/2018 04:33  Sample type Unknown ARTERIAL  pH, Arterial Latest Ref Range: 7.350 - 7.450  7.368  pCO2 arterial Latest Ref Range: 32.0 - 48.0 mmHg 42.7  pO2, Arterial Latest Ref Range: 83.0 - 108.0 mmHg 126.0 (H)  TCO2 Latest Ref Range: 22 - 32 mmol/L 26  Acid-base deficit Latest Ref Range: 0.0 - 2.0 mmol/L 1.0  Bicarbonate Latest Ref Range: 20.0 - 28.0 mmol/L 24.6

## 2018-10-12 NOTE — Progress Notes (Addendum)
Patient ID: Kathleen [name], female   DOB: 11-11-1950, 68 y.o.   MRN: [id number]    Advanced Heart Failure Rounding Note   Subjective:     Remains intubated. Awake but not following commands. RN says she would follow commands on right earlier but not left but would move all 4.   On CVVHD pulling -25. Weight down another 5 pounds (92 pounds total). CVP 1  Failed vent wean horribly this am   On NE 18 with SBP in  60s  Objective:   Weight Range:  Vital Signs:   Temp:  [97.2 F (36.2 C)-98.5 F (36.9 C)] 98.5 F (36.9 C) (06/17 0400) Pulse Rate:  [50-70] 64 (06/17 0800) Resp:  [12-27] 20 (06/17 0800) BP: (80-132)/(45-93) 99/55 (06/17 0800) SpO2:  [84 %-100 %] 94 % (06/17 0800) Arterial Line BP: (60-102)/(41-83) 69/51 (06/17 0800) FiO2 (%):  [40 %-80 %] 50 % (06/17 0800) Weight:  [96.1 kg] 96.1 kg (06/17 0240) Last BM Date: 10/03/18  Weight change: Filed Weights   10/10/18 0300 10/11/18 0405 10/12/18 0240  Weight: 101.3 kg 98.3 kg 96.1 kg    Intake/Output:   Intake/Output Summary (Last 24 hours) at 10/12/2018 0820 Last data filed at 10/12/2018 0800 Gross per 24 hour  Intake 2281.89 ml  Output 2712 ml  Net -430.11 ml     Physical Exam: General:  On vent.  Awake. Not following commands HEENT: normal + ETT Neck: supple. no JVD. Carotids 2+ bilat; no bruits. No lymphadenopathy or thryomegaly appreciated. Cor: PMI nondisplaced. Regular rate & rhythm. No rubs, gallops or murmurs. + TDC Lungs: coarse R>L Abdomen: obese soft, nontender, nondistended. No hepatosplenomegaly. No bruits or masses. Good bowel sounds. Extremities: no cyanosis, clubbing, rash, tr edema  + boots Neuro: alert & orientedx3, cranial nerves grossly intact. moves all 4 extremities w/o difficulty. Affect pleasant   Telemetry: NSR 50-60. Personally reviewed   Labs: Basic Metabolic Panel: Recent Labs  Lab 10/08/18 0403  10/09/18 0308  10/09/18 1549 10/10/18 0418 10/10/18 0423 10/10/18 1637  10/11/18 0418 10/12/18 0430 10/12/18 0433  NA 132*   < > 134*   < > 132* 134* 136 135 134* 134* 136  K 5.7*   < > 4.3   < > 4.0 4.2 4.2 4.4 4.6 4.6 4.4  CL 100   < > 99  --  100 101  --  102 100 102  --   CO2 23   < > 24  --  24 23  --  21* 22 21*  --   GLUCOSE 327*   < > 343*  --  363* 328*  --  307* 308* 267*  --   BUN 33*   < > 39*  --  41* 46*  --  44* 42* 44*  --   CREATININE 1.73*   < > 1.96*  --  1.91* 1.97*  --  1.69* 1.65* 1.69*  --   CALCIUM 8.0*   < > 8.3*  --  8.1* 8.4*  --  8.4* 8.6* 8.9  --   MG 2.8*  --  2.8*  --   --  2.8*  --   --  2.7* 2.8*  --   PHOS 2.2*   < > 2.1*  --  2.3* 2.2*  2.2*  --  3.7 2.8 2.8  --    < > = values in this interval not displayed.    Liver Function Tests: Recent Labs  Lab 10/10/18 0418 10/10/18 1043 10/10/18  1637 10/11/18 0418 10/12/18 0430  AST  --  33  --   --   --   ALT  --  7  --   --   --   ALKPHOS  --  120  --   --   --   BILITOT  --  1.1  --   --   --   PROT  --  7.6  --   --   --   ALBUMIN 2.0* 2.1* 2.2* 2.4* 2.6*   No results for input(s): LIPASE, AMYLASE in the last 168 hours. Recent Labs  Lab 10/10/18 1043  AMMONIA 39*    CBC: Recent Labs  Lab 10/08/18 0403 10/09/18 0308  10/10/18 0418 10/10/18 0423 10/11/18 0418 10/12/18 0430 10/12/18 0433  WBC 13.9* 14.8*  --  13.1*  --  17.6* 22.9*  --   HGB 8.9* 9.2*   < > 9.5* 11.2* 11.5* 12.2 15.6*  HCT 30.4* 31.5*   < > 30.7* 33.0* 38.2 38.9 46.0  MCV 88.4 87.3  --  87.5  --  87.2 88.4  --   PLT 85* 149*  --  124*  --  170 PLATELET CLUMPS NOTED ON SMEAR, COUNT APPEARS DECREASED  --    < > = values in this interval not displayed.    Cardiac Enzymes: No results for input(s): CKTOTAL, CKMB, CKMBINDEX, TROPONINI in the last 168 hours.  BNP: BNP (last 3 results) Recent Labs    09/22/18 1420  BNP 451.2*    ProBNP (last 3 results) No results for input(s): PROBNP in the last 8760 hours.    Other results:  Imaging: Dg Chest Port 1 View  Result Date:  10/12/2018 CLINICAL DATA:  Respiratory failure, ventilatory support EXAM: PORTABLE CHEST 1 VIEW COMPARISON:  10/11/2018 FINDINGS: Endotracheal tube 5.2 cm above the carina. Feeding tube enters the stomach with the tip not visualized on the film. Right IJ dialysis catheter tip mid SVC level. Left upper extremity PICC line tip proximal SVC level. Minimal improvement in the diffuse airspace process versus edema. Patchy right mid lung and bibasilar opacities persist concerning for residual pneumonia. No large effusion or pneumothorax. Heart remains enlarged. IMPRESSION: Slight improvement in bilateral asymmetric airspace process versus edema. Stable support apparatus Electronically Signed   By: Jerilynn Mages.  Shick M.D.   On: 10/12/2018 08:14   Dg Chest Port 1 View  Result Date: 10/11/2018 CLINICAL DATA:  Respiratory failure.  Intubated patient. EXAM: PORTABLE CHEST 1 VIEW COMPARISON:  Single-view of the chest 10/10/2018 and 10/09/2018. FINDINGS: Support tubes and lines are unchanged. Extensive bilateral airspace disease persists but has improved over the past 2 days. Heart size is enlarged. No pneumothorax. Small left pleural effusion noted. No acute bony abnormality. IMPRESSION: Support tubes and lines are unchanged and project in good position. Improved bilateral airspace disease. Electronically Signed   By: Inge Rise M.D.   On: 10/11/2018 08:26     Medications:     Scheduled Medications: . atorvastatin  40 mg Oral q1800  . chlorhexidine gluconate (MEDLINE KIT)  15 mL Mouth Rinse BID  . Chlorhexidine Gluconate Cloth  6 each Topical Daily  . clonazePAM  1 mg Per Tube BID  . feeding supplement (PRO-STAT SUGAR FREE 64)  30 mL Per Tube QID  . feeding supplement (VITAL 1.5 CAL)  1,000 mL Per Tube Q24H  . hydrocortisone sodium succinate  50 mg Intravenous Q6H  . insulin aspart  0-20 Units Subcutaneous Q4H  . insulin aspart  7  Units Subcutaneous Q4H  . insulin detemir  20 Units Subcutaneous BID  . mouth  rinse  15 mL Mouth Rinse 10 times per day  . midodrine  10 mg Oral TID WC  . QUEtiapine  50 mg Oral BID  . sodium chloride flush  10-40 mL Intracatheter Q12H    Infusions: .  prismasol BGK 4/2.5 500 mL/hr at 10/12/18 0538  .  prismasol BGK 4/2.5 300 mL/hr at 10/12/18 0131  . sodium chloride    . sodium chloride Stopped (10/11/18 1907)  . ceFEPime (MAXIPIME) IV Stopped (10/12/18 0011)  . famotidine (PEPCID) IV Stopped (10/11/18 1455)  . fentaNYL infusion INTRAVENOUS 50 mcg/hr (10/12/18 0800)  . heparin 1,250 Units/hr (10/12/18 0800)  . norepinephrine (LEVOPHED) Adult infusion 18 mcg/min (10/12/18 0800)  . prismasol BGK 4/2.5 1,500 mL/hr at 10/12/18 0624  . sodium chloride 999 mL/hr at 10/06/18 1018  . vancomycin Stopped (10/11/18 1902)    PRN Medications: Place/Maintain arterial line **AND** sodium chloride, sodium chloride, acetaminophen (TYLENOL) oral liquid 160 mg/5 mL, bisacodyl, docusate, heparin, midazolam, ondansetron, sodium chloride, sodium chloride flush   Assessment/Plan:   1. PEA arrest on 5/30 - due to hypoxia/pulmonary edema. - now stable  2. Acute on chronic hypoxic respiratory failure - has underlying  OHS/OSA  - intubated on 5/30 in setting of arrest.  - treated for MSSA PNA - extubated 6/3.  - reintubated 6/11 with worsening RUL aispace disease.  - abx broadened to cefipime/vanc.  - PNA and underlying OHS likely main barriers to extubation. Volume status currently looks good. Would continue with current treatment and hopefully we can give her one more shot at extubation before considering trach but mental status may not permit this. Management per CCM. With CVP 1 will not gain any more ground with volume removal. Will actually give some fluid back. - D/w CCM. Given inability to protect airway likely trach at end of this week.   3.  Acute on chronic diastolic HF - Admitted with markedly elevated filling pressures complicated by severe cardiorenal syndrome -  Now down 92 pounds with CVVHD. Suspect this as likely as good as we can get her volume status.  - Anuric.Suspect she may progress to long-term HD - Volume management per Renal and CVVHD - ? Need to w/u for possible TTR amyloid  4. Pulmonary HTN, severe  - this is mostly pulmonary venous HTN by cath [facility] 2) but also has a component of OHS/OSA (WHO group 3) - no role for selective pulmonary vasodilators  5. AKI on CKD 3 - baseline creatinine 1.9 -> ESRD - likely cardiorenal - We have suspected that she may progress to long-term HD. Remains anuric - Remains on CVVHD. Appreciate Dr. Deterding's management  6. Morbid obesity - needs weight loss  6. Chronic AF/AFL - Off apixaban. Now on heparin. No bleeding. Discussed dosing with PharmD personally. - Rhythm looks to be back in NSR - No role for PPM currently  7. VT/NSVT - Quiescent.  off amio due to bradycardia - Keep K> 4.0 Mg 2.0  8. MSSA PNA - Bcx drawn 6/2 & 6/11. NGTD - sputum cx with MSSA PNA - CXR with worsening RUL PNA. Cefazolin switched to cefipime/vanc  9. Hypotension - ?  Due to sepsis.  - Now on midodrine 10 tid and NE 18  Continue to wean as tolerated. May need to let volume status rise a bit to permit pressor wean. With CVP 1 would give 250cc x 1.   Have  d/w CCM service who will take over primary management.  We will continue to follow at a distance to keep an eye on her HR  CRITICAL CARE Performed by: Glori Bickers  Total critical care time: 35 minutes  Critical care time was exclusive of separately billable procedures and treating other patients.  Critical care was necessary to treat or prevent imminent or life-threatening deterioration.  Critical care was time spent personally by me (independent of midlevel providers or residents) on the following activities: development of treatment plan with patient and/or surrogate as well as nursing, discussions with consultants, evaluation of patient's  response to treatment, examination of patient, obtaining history from patient or surrogate, ordering and performing treatments and interventions, ordering and review of laboratory studies, ordering and review of radiographic studies, pulse oximetry and re-evaluation of patient's condition.    Length of Stay: 20  Glori Bickers MD 10/12/2018, 8:20 AM  Advanced Heart Failure Team Pager [phone] (M-F; [place])  Please contact [facility] for night-coverage after hours (4p -7a ) and weekends on [email or web address]

## 2018-10-12 NOTE — Progress Notes (Signed)
[place] for Heparin Indication: atrial fibrillation  Allergies  Allergen Reactions  . Doxycycline     REACTION: Wheals and pruritus    Patient Measurements: Height: 5' (152.4 cm) Weight: 211 lb 13.8 oz (96.1 kg) IBW/kg (Calculated) : 45.5  HEPARIN DW (KG): 71.8  Vital Signs: Temp: 98.5 F (36.9 C) (06/17 0400) Temp Source: Oral (06/17 0400) BP: 92/53 (06/17 0900) Pulse Rate: 62 (06/17 0900)  Labs: Recent Labs    10/10/18 0418  10/10/18 1637 10/10/18 1851 10/11/18 0418 10/12/18 0430 10/12/18 0433  HGB 9.5*   < >  --   --  11.5* 12.2 15.6*  HCT 30.7*   < >  --   --  38.2 38.9 46.0  PLT 124*  --   --   --  170 PLATELET CLUMPS NOTED ON SMEAR, COUNT APPEARS DECREASED  --   HEPARINUNFRC 0.14*  --   --  0.32 0.33 0.50  --   CREATININE 1.97*  --  1.69*  --  1.65* 1.69*  --    < > = values in this interval not displayed.    Estimated Creatinine Clearance: 34 mL/min (A) (by C-G formula based on SCr of 1.69 mg/dL (H)).  Assessment: Kathleen [name] is a 32yoF with a history of afib/aflutter taking Eliquis PTA. Her apixaban was initially continued while inpatient, last dose 5/29, now on hold for potential need for [facility], currently on CRRT. Pharmacy consulted to dose heparin.  Heparin remains therapeutic this morning at 0.50. CBC stable from previous, no issues with infusion or bleeding noted per nursing.    Goal of Therapy:  Heparin level 0.3-0.7 units/ml  Monitor platelets by anticoagulation protocol: Yes   Plan:  Continue heparin at 1250 units/hr Heparin level and CBC daily while on heparin Monitor for s/sx bleeding Follow plans to transition to oral anticoagulant when extubated   Brendolyn Patty, PharmD PGY1 Pharmacy Resident Phone [phone]  10/12/2018   9:29 AM

## 2018-10-12 NOTE — Progress Notes (Signed)
Subjective: Interval History: has no complaint, on vent, awake.  Objective: Vital signs in last 24 hours: Temp:  [97.2 F (36.2 C)-98.5 F (36.9 C)] 98.5 F (36.9 C) (06/17 0400) Pulse Rate:  [50-70] 64 (06/17 0800) Resp:  [12-27] 20 (06/17 0800) BP: (80-132)/(45-93) 99/55 (06/17 0800) SpO2:  [84 %-100 %] 94 % (06/17 0800) Arterial Line BP: (60-102)/(41-83) 69/51 (06/17 0800) FiO2 (%):  [40 %-80 %] 50 % (06/17 0800) Weight:  [96.1 kg] 96.1 kg (06/17 0240) Weight change: -2.2 kg  Intake/Output from previous day: 06/16 0701 - 06/17 0700 In: 1942.4 [I.V.:762.5; NG/GT:780; IV Piggyback:400] Out: 2777  Intake/Output this shift: Total I/O In: 434 [I.V.:44; NG/GT:390] Out: 89 [Other:89]  General appearance: alert, cooperative, morbidly obese and pale Neck: Ij cntrast Resp: rales bibasilar and rhonchi bibasilar Cardio: regular rate and rhythm, S1, S2 normal and systolic murmur: holosystolic 2/6, blowing at 2nd left intercostal space GI: obese, pos bs, soft, liver down 6 cm Extremities: edema 2-3+  Lab Results: Recent Labs    10/11/18 0418 10/12/18 0430 10/12/18 0433  WBC 17.6* 22.9*  --   HGB 11.5* 12.2 15.6*  HCT 38.2 38.9 46.0  PLT 170 PLATELET CLUMPS NOTED ON SMEAR, COUNT APPEARS DECREASED  --    BMET:  Recent Labs    10/11/18 0418 10/12/18 0430 10/12/18 0433  NA 134* 134* 136  K 4.6 4.6 4.4  CL 100 102  --   CO2 22 21*  --   GLUCOSE 308* 267*  --   BUN 42* 44*  --   CREATININE 1.65* 1.69*  --   CALCIUM 8.6* 8.9  --    No results for input(s): PTH in the last 72 hours. Iron Studies: No results for input(s): IRON, TIBC, TRANSFERRIN, FERRITIN in the last 72 hours.  Studies/Results: Dg Chest Port 1 View  Result Date: 10/12/2018 CLINICAL DATA:  Respiratory failure, ventilatory support EXAM: PORTABLE CHEST 1 VIEW COMPARISON:  10/11/2018 FINDINGS: Endotracheal tube 5.2 cm above the carina. Feeding tube enters the stomach with the tip not visualized on the film.  Right IJ dialysis catheter tip mid SVC level. Left upper extremity PICC line tip proximal SVC level. Minimal improvement in the diffuse airspace process versus edema. Patchy right mid lung and bibasilar opacities persist concerning for residual pneumonia. No large effusion or pneumothorax. Heart remains enlarged. IMPRESSION: Slight improvement in bilateral asymmetric airspace process versus edema. Stable support apparatus Electronically Signed   By: Jerilynn Mages.  Shick M.D.   On: 10/12/2018 08:14   Dg Chest Port 1 View  Result Date: 10/11/2018 CLINICAL DATA:  Respiratory failure.  Intubated patient. EXAM: PORTABLE CHEST 1 VIEW COMPARISON:  Single-view of the chest 10/10/2018 and 10/09/2018. FINDINGS: Support tubes and lines are unchanged. Extensive bilateral airspace disease persists but has improved over the past 2 days. Heart size is enlarged. No pneumothorax. Small left pleural effusion noted. No acute bony abnormality. IMPRESSION: Support tubes and lines are unchanged and project in good position. Improved bilateral airspace disease. Electronically Signed   By: Inge Rise M.D.   On: 10/11/2018 08:26    I have reviewed the patient's current medications.  Assessment/Plan: 1 AKI oliguric ATN .  CRRT providing good solute, acid/base/K control. Vol xs but bp low , and O2 ok  So keep even and see if can wean pressors 2 Low bps even, see if can wean pressors 3 VDRF per CCM  , CXR clearing 4 ^ WBC  May need to change lines 5 Obesity 6 DM controlled  7 Anemia stable P even, lower pressors. TF, vent    LOS: 20 days   Jeneen Rinks Henretter Piekarski 10/12/2018,8:24 AM

## 2018-10-13 ENCOUNTER — Inpatient Hospital Stay (HOSPITAL_COMMUNITY): Payer: Medicare HMO

## 2018-10-13 LAB — CBC
HCT: 36.2 % (ref 36.0–46.0)
Hemoglobin: 11.1 g/dL — ABNORMAL LOW (ref 12.0–15.0)
MCH: 28 pg (ref 26.0–34.0)
MCHC: 30.7 g/dL (ref 30.0–36.0)
MCV: 91.4 fL (ref 80.0–100.0)
Platelets: 118 10*3/uL — ABNORMAL LOW (ref 150–400)
RBC: 3.96 MIL/uL (ref 3.87–5.11)
RDW: 25 % — ABNORMAL HIGH (ref 11.5–15.5)
WBC: 20.4 10*3/uL — ABNORMAL HIGH (ref 4.0–10.5)
nRBC: 2.3 % — ABNORMAL HIGH (ref 0.0–0.2)

## 2018-10-13 LAB — RENAL FUNCTION PANEL
Albumin: 2.3 g/dL — ABNORMAL LOW (ref 3.5–5.0)
Albumin: 2.4 g/dL — ABNORMAL LOW (ref 3.5–5.0)
Anion gap: 12 (ref 5–15)
Anion gap: 10 (ref 5–15)
BUN: 48 mg/dL — ABNORMAL HIGH (ref 8–23)
BUN: 43 mg/dL — ABNORMAL HIGH (ref 8–23)
CO2: 19 mmol/L — ABNORMAL LOW (ref 22–32)
CO2: 20 mmol/L — ABNORMAL LOW (ref 22–32)
Calcium: 8 mg/dL — ABNORMAL LOW (ref 8.9–10.3)
Calcium: 8.7 mg/dL — ABNORMAL LOW (ref 8.9–10.3)
Chloride: 102 mmol/L (ref 98–111)
Chloride: 105 mmol/L (ref 98–111)
Creatinine, Ser: 1.87 mg/dL — ABNORMAL HIGH (ref 0.44–1.00)
Creatinine, Ser: 1.64 mg/dL — ABNORMAL HIGH (ref 0.44–1.00)
GFR calc Af Amer: 32 mL/min — ABNORMAL LOW (ref >60)
GFR calc Af Amer: 37 mL/min — ABNORMAL LOW (ref >60)
GFR calc non Af Amer: 28 mL/min — ABNORMAL LOW (ref >60)
GFR calc non Af Amer: 32 mL/min — ABNORMAL LOW (ref >60)
Glucose, Bld: 308 mg/dL — ABNORMAL HIGH (ref 70–99)
Glucose, Bld: 163 mg/dL — ABNORMAL HIGH (ref 70–99)
Phosphorus: 2.9 mg/dL (ref 2.5–4.6)
Phosphorus: 2.7 mg/dL (ref 2.5–4.6)
Potassium: 4.9 mmol/L (ref 3.5–5.1)
Potassium: 5.8 mmol/L — ABNORMAL HIGH (ref 3.5–5.1)
Sodium: 133 mmol/L — ABNORMAL LOW (ref 135–145)
Sodium: 135 mmol/L (ref 135–145)

## 2018-10-13 LAB — HEPATIC FUNCTION PANEL
ALT: 23 U/L (ref 0–44)
AST: 38 U/L (ref 15–41)
Albumin: 2.4 g/dL — ABNORMAL LOW (ref 3.5–5.0)
Alkaline Phosphatase: 88 U/L (ref 38–126)
Bilirubin, Direct: 0.5 mg/dL — ABNORMAL HIGH (ref 0.0–0.2)
Indirect Bilirubin: 1.1 mg/dL — ABNORMAL HIGH (ref 0.3–0.9)
Total Bilirubin: 1.6 mg/dL — ABNORMAL HIGH (ref 0.3–1.2)
Total Protein: 7.5 g/dL (ref 6.5–8.1)

## 2018-10-13 LAB — GLUCOSE, CAPILLARY
Glucose-Capillary: 153 mg/dL — ABNORMAL HIGH (ref 70–99)
Glucose-Capillary: 162 mg/dL — ABNORMAL HIGH (ref 70–99)
Glucose-Capillary: 213 mg/dL — ABNORMAL HIGH (ref 70–99)
Glucose-Capillary: 225 mg/dL — ABNORMAL HIGH (ref 70–99)
Glucose-Capillary: 229 mg/dL — ABNORMAL HIGH (ref 70–99)
Glucose-Capillary: 244 mg/dL — ABNORMAL HIGH (ref 70–99)
Glucose-Capillary: 287 mg/dL — ABNORMAL HIGH (ref 70–99)

## 2018-10-13 LAB — MAGNESIUM: Magnesium: 2.6 mg/dL — ABNORMAL HIGH (ref 1.7–2.4)

## 2018-10-13 LAB — PROTIME-INR
INR: 1.2 (ref 0.8–1.2)
Prothrombin Time: 14.9 seconds (ref 11.4–15.2)

## 2018-10-13 LAB — LACTIC ACID, PLASMA: Lactic Acid, Venous: 2 mmol/L (ref 0.5–1.9)

## 2018-10-13 LAB — PARATHYROID HORMONE, INTACT (NO CA): PTH: 33 pg/mL (ref 15–65)

## 2018-10-13 LAB — PROCALCITONIN: Procalcitonin: 0.19 ng/mL

## 2018-10-13 LAB — TROPONIN I: Troponin I: 0.05 ng/mL (ref <0.03)

## 2018-10-13 LAB — HEPARIN LEVEL (UNFRACTIONATED): Heparin Unfractionated: 0.44 IU/mL (ref 0.30–0.70)

## 2018-10-13 MED ORDER — INSULIN DETEMIR 100 UNIT/ML ~~LOC~~ SOLN
30.0000 [IU] | Freq: Two times a day (BID) | SUBCUTANEOUS | Status: DC
  Administered 2018-10-13 – 2018-10-23 (×20): 30 [IU] via SUBCUTANEOUS
  Filled 2018-10-13 (×22): qty 0.3

## 2018-10-13 MED ORDER — ROCURONIUM BROMIDE 50 MG/5ML IV SOLN
50.0000 mg | Freq: Once | INTRAVENOUS | Status: DC | PRN
  Filled 2018-10-13: qty 5

## 2018-10-13 MED ORDER — FENTANYL CITRATE (PF) 100 MCG/2ML IJ SOLN: 200.0000 ug | Freq: Once | INTRAMUSCULAR | Status: DC

## 2018-10-13 MED ORDER — CHLORHEXIDINE GLUCONATE CLOTH 2 % EX PADS
6.0000 | MEDICATED_PAD | Freq: Every day | CUTANEOUS | Status: DC
  Administered 2018-10-13 – 2018-10-16 (×3): 6 via TOPICAL

## 2018-10-13 MED ORDER — SODIUM CHLORIDE 0.9 % IV SOLN
510.0000 mg | Freq: Once | INTRAVENOUS | Status: AC
  Administered 2018-10-13: 510 mg via INTRAVENOUS
  Filled 2018-10-13: qty 17

## 2018-10-13 MED ORDER — SODIUM CHLORIDE 0.9 % IV BOLUS
500.0000 mL | Freq: Once | INTRAVENOUS | Status: AC
  Administered 2018-10-13: 500 mL via INTRAVENOUS

## 2018-10-13 MED ORDER — ETOMIDATE 2 MG/ML IV SOLN: 40.0000 mg | Freq: Once | INTRAVENOUS | Status: DC

## 2018-10-13 MED ORDER — MIDAZOLAM HCL 2 MG/2ML IJ SOLN: 5.0000 mg | Freq: Once | INTRAMUSCULAR | Status: DC

## 2018-10-13 MED ORDER — POLYETHYLENE GLYCOL 3350 17 G PO PACK
17.0000 g | PACK | Freq: Every day | ORAL | Status: DC
  Administered 2018-10-13 – 2018-11-16 (×16): 17 g via ORAL
  Filled 2018-10-13 (×21): qty 1

## 2018-10-13 NOTE — Progress Notes (Signed)
[place] for Heparin Indication: atrial fibrillation  Allergies  Allergen Reactions  . Doxycycline     REACTION: Wheals and pruritus    Patient Measurements: Height: 5' (152.4 cm) Weight: 218 lb 14.7 oz (99.3 kg) IBW/kg (Calculated) : 45.5  HEPARIN DW (KG): 71.8  Vital Signs: Temp: 99.2 F (37.3 C) (06/18 0749) Temp Source: Oral (06/18 0749) BP: 92/54 (06/18 0800) Pulse Rate: 69 (06/18 0800)  Labs: Recent Labs    10/11/18 0418 10/12/18 0430 10/12/18 0433 10/12/18 1640 10/13/18 0259 10/13/18 0315  HGB 11.5* 12.2 15.6*  --  11.1*  --   HCT 38.2 38.9 46.0  --  36.2  --   PLT 170 PLATELET CLUMPS NOTED ON SMEAR, COUNT APPEARS DECREASED  --   --  118*  --   LABPROT  --   --   --   --  14.9  --   INR  --   --   --   --  1.2  --   HEPARINUNFRC 0.33 0.50  --   --  0.44  --   CREATININE 1.65* 1.69*  --  1.76*  --  1.87*  TROPONINI  --   --   --   --  0.05*  --     Estimated Creatinine Clearance: 31.3 mL/min (A) (by C-G formula based on SCr of 1.87 mg/dL (H)).  Assessment: Kathleen [name] is a 89yoF with a history of afib/aflutter taking Eliquis PTA. Her apixaban was initially continued while inpatient, last dose 5/29, now on hold for potential need for [facility], currently on CRRT. Pharmacy consulted to dose heparin.  Heparin remains therapeutic this morning at 0.44. Hgb 11.1 today down from 15.6, but this is likely an outlier as 6/15 and 6/16 show hgb at 11.2 and 11. Platelets at 118 today. No issues with infusion or bleeding noted per nursing.    Goal of Therapy:  Heparin level 0.3-0.7 units/ml  Monitor platelets by anticoagulation protocol: Yes   Plan:  Continue heparin at 1250 units/hr Heparin level and CBC daily while on heparin Monitor for s/sx bleeding Follow plans to transition to oral anticoagulant when extubated   Thank you for the interesting consult and for involving pharmacy in this patient's care.  Tamela Gammon, PharmD  10/13/2018 8:49 AM PGY-1 Pharmacy Resident Direct Phone: [phone] Please check [email or web address] for unit-specific pharmacist phone numbers

## 2018-10-13 NOTE — Progress Notes (Signed)
Patient ID: Kathleen [name], female   DOB: 1951/03/16, 68 y.o.   MRN: [id number]    Advanced Heart Failure Rounding Note   Subjective:     Remains intubated. Awake on vent. More alert. Now following commands.   On CVVHD now negative. Got 500cc IVF yesterday for worsening hypotension. Still 15 mcg of NE. CVP 3  Objective:   Weight Range:  Vital Signs:   Temp:  [97.8 F (36.6 C)-99.3 F (37.4 C)] 99.2 F (37.3 C) (06/18 0749) Pulse Rate:  [53-70] 69 (06/18 0800) Resp:  [13-33] 25 (06/18 0800) BP: (71-115)/(45-71) 92/54 (06/18 0800) SpO2:  [88 %-100 %] 98 % (06/18 0800) Arterial Line BP: (70-72)/(49-50) 72/49 (06/17 1100) FiO2 (%):  [40 %-60 %] 40 % (06/18 0752) Weight:  [99.3 kg] 99.3 kg (06/18 0500) Last BM Date: (P) 10/03/18  Weight change: Filed Weights   10/11/18 0405 10/12/18 0240 10/13/18 0500  Weight: 98.3 kg 96.1 kg 99.3 kg    Intake/Output:   Intake/Output Summary (Last 24 hours) at 10/13/2018 0903 Last data filed at 10/13/2018 0800 Gross per 24 hour  Intake 1935.32 ml  Output 1907 ml  Net 28.32 ml     Physical Exam: General:  On vent.  Awake. Following most commands HEENT: normal +ETT Neck: supple. no JVD. RIJ TLC Carotids 2+ bilat; no bruits. No lymphadenopathy or thryomegaly appreciated. Cor: PMI nondisplaced. Irregular rate & rhythm. No rubs, gallops or murmurs. Lungs: clear Abdomen: obese soft, nontender, nondistended. No hepatosplenomegaly. No bruits or masses. Good bowel sounds. Extremities: no cyanosis, clubbing, rash, tr edema + boots Neuro: Awake follows most comamnds   Telemetry: AF 60s. Personally reviewed   Labs: Basic Metabolic Panel: Recent Labs  Lab 10/09/18 0308  10/10/18 0418  10/10/18 1637 10/11/18 0418 10/12/18 0430 10/12/18 0433 10/12/18 1640 10/13/18 0259 10/13/18 0315  NA 134*   < > 134*   < > 135 134* 134* 136 133*  --  133*  K 4.3   < > 4.2   < > 4.4 4.6 4.6 4.4 4.3  --  4.9  CL 99   < > 101  --  102 100 102  --   102  --  102  CO2 24   < > 23  --  21* 22 21*  --  19*  --  19*  GLUCOSE 343*   < > 328*  --  307* 308* 267*  --  260*  --  308*  BUN 39*   < > 46*  --  44* 42* 44*  --  46*  --  48*  CREATININE 1.96*   < > 1.97*  --  1.69* 1.65* 1.69*  --  1.76*  --  1.87*  CALCIUM 8.3*   < > 8.4*  --  8.4* 8.6* 8.9  --  8.3*  --  8.0*  MG 2.8*  --  2.8*  --   --  2.7* 2.8*  --   --  2.6*  --   PHOS 2.1*   < > 2.2*  2.2*  --  3.7 2.8 2.8  --  2.8  --  2.9   < > = values in this interval not displayed.    Liver Function Tests: Recent Labs  Lab 10/10/18 1043  10/11/18 0418 10/12/18 0430 10/12/18 1640 10/13/18 0259 10/13/18 0315  AST 33  --   --   --   --  38  --   ALT 7  --   --   --   --  23  --   ALKPHOS 120  --   --   --   --  88  --   BILITOT 1.1  --   --   --   --  1.6*  --   PROT 7.6  --   --   --   --  7.5  --   ALBUMIN 2.1*   < > 2.4* 2.6* 2.5* 2.4* 2.3*   < > = values in this interval not displayed.   No results for input(s): LIPASE, AMYLASE in the last 168 hours. Recent Labs  Lab 10/10/18 1043  AMMONIA 39*    CBC: Recent Labs  Lab 10/09/18 0308  10/10/18 0418 10/10/18 0423 10/11/18 0418 10/12/18 0430 10/12/18 0433 10/13/18 0259  WBC 14.8*  --  13.1*  --  17.6* 22.9*  --  20.4*  HGB 9.2*   < > 9.5* 11.2* 11.5* 12.2 15.6* 11.1*  HCT 31.5*   < > 30.7* 33.0* 38.2 38.9 46.0 36.2  MCV 87.3  --  87.5  --  87.2 88.4  --  91.4  PLT 149*  --  124*  --  170 PLATELET CLUMPS NOTED ON SMEAR, COUNT APPEARS DECREASED  --  118*   < > = values in this interval not displayed.    Cardiac Enzymes: Recent Labs  Lab 10/13/18 0259  TROPONINI 0.05*    BNP: BNP (last 3 results) Recent Labs    09/22/18 1420  BNP 451.2*    ProBNP (last 3 results) No results for input(s): PROBNP in the last 8760 hours.    Other results:  Imaging: Dg Chest Port 1 View  Result Date: 10/13/2018 CLINICAL DATA:  ETT present ,resp failure EXAM: PORTABLE CHEST 1 VIEW COMPARISON:  Chest x-rays dated  10/12/2018, 10/11/2018 and 10/09/2018. FINDINGS: Mild cardiomegaly is stable. Overall cardiomediastinal silhouette is stable. Endotracheal tube is well positioned with tip just above the level of the carina. Enteric tube passes below the diaphragm. RIGHT subclavian central line is stable in position with tip at the level of the mid SVC. Continued central pulmonary vascular congestion and mild bilateral interstitial edema, unchanged compared to yesterday's exam, improved compared to the earlier study of 10/09/2018. Probable small LEFT pleural effusion. No pneumothorax seen. IMPRESSION: 1. Endotracheal tube well positioned with tip just above the level of the carina. 2. Continued central pulmonary vascular congestion and mild bilateral interstitial edema, unchanged compared to yesterday's exam, improved compared to yesterday's exam. 3. Probable small LEFT pleural effusion. Electronically Signed   By: Franki Cabot M.D.   On: 10/13/2018 08:33   Dg Chest Port 1 View  Result Date: 10/12/2018 CLINICAL DATA:  Respiratory failure, ventilatory support EXAM: PORTABLE CHEST 1 VIEW COMPARISON:  10/11/2018 FINDINGS: Endotracheal tube 5.2 cm above the carina. Feeding tube enters the stomach with the tip not visualized on the film. Right IJ dialysis catheter tip mid SVC level. Left upper extremity PICC line tip proximal SVC level. Minimal improvement in the diffuse airspace process versus edema. Patchy right mid lung and bibasilar opacities persist concerning for residual pneumonia. No large effusion or pneumothorax. Heart remains enlarged. IMPRESSION: Slight improvement in bilateral asymmetric airspace process versus edema. Stable support apparatus Electronically Signed   By: Jerilynn Mages.  Shick M.D.   On: 10/12/2018 08:14     Medications:     Scheduled Medications: . atorvastatin  40 mg Oral q1800  . chlorhexidine gluconate (MEDLINE KIT)  15 mL Mouth Rinse BID  . Chlorhexidine Gluconate Cloth  6 each  Topical Daily  .  clonazePAM  1 mg Per Tube BID  . feeding supplement (PRO-STAT SUGAR FREE 64)  30 mL Per Tube QID  . feeding supplement (VITAL 1.5 CAL)  1,000 mL Per Tube Q24H  . hydrocortisone sodium succinate  50 mg Intravenous Q6H  . insulin aspart  0-20 Units Subcutaneous Q4H  . insulin aspart  10 Units Subcutaneous Q4H  . insulin detemir  25 Units Subcutaneous BID  . mouth rinse  15 mL Mouth Rinse 10 times per day  . midodrine  10 mg Oral TID WC  . QUEtiapine  50 mg Oral BID  . sodium chloride flush  10-40 mL Intracatheter Q12H    Infusions: .  prismasol BGK 4/2.5 500 mL/hr at 10/12/18 1558  .  prismasol BGK 4/2.5 300 mL/hr at 10/13/18 0215  . sodium chloride    . sodium chloride Stopped (10/11/18 1907)  . ceFEPime (MAXIPIME) IV Stopped (10/13/18 0038)  . famotidine (PEPCID) IV Stopped (10/12/18 1500)  . fentaNYL infusion INTRAVENOUS 20 mcg/hr (10/13/18 0800)  . ferumoxytol 510 mg (10/13/18 0849)  . heparin Stopped (10/13/18 0413)  . norepinephrine (LEVOPHED) Adult infusion 16 mcg/min (10/13/18 0800)  . prismasol BGK 4/2.5 1,500 mL/hr at 10/12/18 2342  . sodium chloride 999 mL/hr at 10/06/18 1018    PRN Medications: Place/Maintain arterial line **AND** sodium chloride, sodium chloride, acetaminophen (TYLENOL) oral liquid 160 mg/5 mL, bisacodyl, docusate, heparin, midazolam, ondansetron, sodium chloride, sodium chloride flush   Assessment/Plan:   1. PEA arrest on 5/30 - due to hypoxia/pulmonary edema. - now stable  2. Acute on chronic hypoxic respiratory failure - has underlying  OHS/OSA  - intubated on 5/30 in setting of arrest.  - treated for MSSA PNA - extubated 6/3.  - reintubated 6/11 with worsening RUL aispace disease.  - abx broadened to cefipime/vanc.  - PNA and underlying OHS likely main barriers to extubation. Volume status currently looks good. Would continue with current treatment and hopefully we can give her one more shot at extubation before considering trach. Mental  status seems improved today. Management per CCM. With CVP 3 will not gain any more ground with volume removal. Will give another 500cc back today - D/w CCM at bedside  3.  Acute on chronic diastolic HF - Admitted with markedly elevated filling pressures complicated by severe cardiorenal syndrome - Now down 85 pounds with CVVHD. Suspect this as likely as good as we can get her volume status. See discussion above.  - Volume management per Renal and CVVHD. Will give back 500cc again today to help wean NE  - ? Need to w/u for possible TTR amyloid  4. Pulmonary HTN, severe  - this is mostly pulmonary venous HTN by cath [facility] 2) but also has a component of OHS/OSA (WHO group 3) - no role for selective pulmonary vasodilators  5. AKI on CKD 3 - baseline creatinine 1.9 -> ESRD - likely cardiorenal - We have suspected that she may progress to long-term HD. Remains anuric - Remains on CVVHD. Appreciate Dr. Deterding's management  6. Morbid obesity - needs weight loss  6. Chronic AF/AFL - Off apixaban. Now on heparin. No bleeding. Discussed dosing with PharmD personally. - Rhythm looks to be back in AF today - No role for PPM currently  7. VT/NSVT - Quiescent.  off amio due to bradycardia - Keep K> 4.0 Mg 2.0  8. MSSA PNA - Bcx drawn 6/2 & 6/11. NGTD - sputum cx with MSSA PNA - CXR  with worsening RUL PNA. Cefazolin switched to cefipime/vanc  9. Hypotension - ?  Due to sepsis.  - Now on midodrine 10 tid and NE 15 Continue to wean as tolerated. May need to let volume status rise a bit to permit pressor wean. With CVP 3 would give 500cc x 1.    CRITICAL CARE Performed by: Glori Bickers  Total critical care time: 35 minutes  Critical care time was exclusive of separately billable procedures and treating other patients.  Critical care was necessary to treat or prevent imminent or life-threatening deterioration.  Critical care was time spent personally by me (independent  of midlevel providers or residents) on the following activities: development of treatment plan with patient and/or surrogate as well as nursing, discussions with consultants, evaluation of patient's response to treatment, examination of patient, obtaining history from patient or surrogate, ordering and performing treatments and interventions, ordering and review of laboratory studies, ordering and review of radiographic studies, pulse oximetry and re-evaluation of patient's condition.    Length of Stay: 21  Glori Bickers MD 10/13/2018, 9:03 AM  Advanced Heart Failure Team Pager [phone] (M-F; [place])  Please contact [facility] for night-coverage after hours (4p -7a ) and weekends on [email or web address]

## 2018-10-13 NOTE — Progress Notes (Signed)
Subjective: Interval History: has no complaint , awake coop.  Still on pressors but less, .  Objective: Vital signs in last 24 hours: Temp:  [97.8 F (36.6 C)-99.3 F (37.4 C)] 99.2 F (37.3 C) (06/18 0749) Pulse Rate:  [53-70] 69 (06/18 0800) Resp:  [13-33] 25 (06/18 0800) BP: (71-115)/(45-71) 92/54 (06/18 0800) SpO2:  [88 %-100 %] 98 % (06/18 0800) Arterial Line BP: (70-72)/(49-53) 72/49 (06/17 1100) FiO2 (%):  [40 %-60 %] 40 % (06/18 0752) Weight:  [99.3 kg] 99.3 kg (06/18 0500) Weight change: 3.2 kg  Intake/Output from previous day: 06/17 0701 - 06/18 0700 In: 2412 [I.V.:692.8; NG/GT:960; IV Piggyback:759.1] Out: 1987  Intake/Output this shift: Total I/O In: 154 [I.V.:34; NG/GT:120] Out: -   General appearance: alert, cooperative and pale Resp: rales bibasilar Cardio: S1, S2 normal and systolic murmur: holosystolic 2/6, blowing at apex GI: obese, pos bs, soft Extremities: edema 2-3+  On vent, alert, coop  Lab Results: Recent Labs    10/12/18 0430 10/12/18 0433 10/13/18 0259  WBC 22.9*  --  20.4*  HGB 12.2 15.6* 11.1*  HCT 38.9 46.0 36.2  PLT PLATELET CLUMPS NOTED ON SMEAR, COUNT APPEARS DECREASED  --  118*   BMET:  Recent Labs    10/12/18 1640 10/13/18 0315  NA 133* 133*  K 4.3 4.9  CL 102 102  CO2 19* 19*  GLUCOSE 260* 308*  BUN 46* 48*  CREATININE 1.76* 1.87*  CALCIUM 8.3* 8.0*   No results for input(s): PTH in the last 72 hours. Iron Studies:  Recent Labs    10/12/18 1640  IRON 44  TIBC 379    Studies/Results: Dg Chest Port 1 View  Result Date: 10/12/2018 CLINICAL DATA:  Respiratory failure, ventilatory support EXAM: PORTABLE CHEST 1 VIEW COMPARISON:  10/11/2018 FINDINGS: Endotracheal tube 5.2 cm above the carina. Feeding tube enters the stomach with the tip not visualized on the film. Right IJ dialysis catheter tip mid SVC level. Left upper extremity PICC line tip proximal SVC level. Minimal improvement in the diffuse airspace process  versus edema. Patchy right mid lung and bibasilar opacities persist concerning for residual pneumonia. No large effusion or pneumothorax. Heart remains enlarged. IMPRESSION: Slight improvement in bilateral asymmetric airspace process versus edema. Stable support apparatus Electronically Signed   By: Jerilynn Mages.  Shick M.D.   On: 10/12/2018 08:14    I have reviewed the patient's current medications.  Assessment/Plan: 1 AKI/CKD3-4 vol ok. CRRT with good solute,K control. Mild acidemia. 2 Anemia stable 3 VDRF per CCM, hopefully wean 4 obesity 5 CAD/arrest per Cards 6 DM controlled P CRRT, wean pressors, follow acid base, consider ^ CRRT rates.     LOS: 21 days   Kathleen [name] 10/13/2018,8:07 AM

## 2018-10-13 NOTE — Progress Notes (Addendum)
Inpatient Diabetes Program Recommendations  AACE/ADA: New Consensus Statement on Inpatient Glycemic Control (2015)  Target Ranges:  Prepandial:   less than 140 mg/dL      Peak postprandial:   less than 180 mg/dL (1-2 hours)      Critically ill patients:  140 - 180 mg/dL   Lab Results  Component Value Date   GLUCAP 244 (H) 10/13/2018   HGBA1C 7.4 (A) 09/13/2018    Review of Glycemic Control Results for Kathleen [name], Kathleen [name] (MRN [id number]) as of 10/13/2018 10:17  Ref. Range 10/12/2018 16:00 10/12/2018 19:39 10/12/2018 23:44 10/13/2018 04:19 10/13/2018 07:54  Glucose-Capillary Latest Ref Range: 70 - 99 mg/dL 254 (H) 186 (H) 213 (H) 287 (H) 244 (H)    Inpatient Diabetes Program Recommendations:   Consider increase in Levemir to 30 units bid. Received verbal order from Dr. Chase Caller to make change to 30 units bid.  Thank you, Nani Gasser. Cyncere Ruhe, RN, MSN, CDE  Diabetes Coordinator Inpatient Glycemic Control Team Team Pager [phone] (8am-5pm) 10/13/2018 10:19 AM

## 2018-10-13 NOTE — Progress Notes (Signed)
Filter change

## 2018-10-13 NOTE — Progress Notes (Addendum)
-  Spoke at length with family (son Luiz Ochoa and his wife [name]). -They are okay with trach but want one more trial of extubation tomorrow AM. -Plan is to extubate first thing in morning, should she fail plans to trach shortly thereafter. -I will call [name] regarding plan around noon tomorrow. -Family is okay with trach after discussing risks/benefits, RN to call to witness/confirm - Stop heparin and TF at [facility]   Erskine Emery MD

## 2018-10-13 NOTE — Progress Notes (Signed)
NAME:  Kathleen [name], MRN:  [id number], DOB:  16-Oct-1950, LOS: 21 ADMISSION DATE:  09/22/2018, CONSULTATION DATE: 09/24/2018 REFERRING MD: Candee Furbish MD, CHIEF COMPLAINT: Cardiac arrest  Brief History   68 y/o obese woman with chronic diastolic heart failure who failed outpatient diuretics management due to chronic kidney disease.  Right heart cath on 5/29 showed markedly elevated biventricular filling pressures with normal cardiac output.  She was on cardiology service, maintained on milrinone and Lasix.  On the morning of 5/30 she went into PEA arrest, CPR performed for 8 minutes.  Intubated and transferred to ICU.  Started on CRRT 5/31.  Extubated 6/3.  Re-intubated early am 6/11 for hypercarbic respiratory failure  Past Medical History   has a past medical history of Atrial flutter ([place]), Bradycardia, Chronic diastolic (congestive) heart failure ([place]) (01/10/2015), CKD (chronic kidney disease), stage IV ([place]), Degenerative joint disease of hand, Diabetes mellitus, Dyslipidemia, Fecal occult blood test positive, GERD (gastroesophageal reflux disease), Headache, Hypertension, Inadequate material resources, Irritable bowel syndrome, Morbid obesity ([place]), Obesity hypoventilation syndrome ([place]), Post-menopausal bleeding, and Shortness of breath dyspnea.  Significant Hospital Events   5/28 Admit 5/29 RHC 5/30 PEA arrest, intubated/ CRRT 5/31 Milrinone stopped due to ectopy; brady episode- amio stopped 6/02 Attempt at SBT, chest x-ray with worsening pulmonary edema, CVVHD for volume removal 6/03 Extubated > remain on CRRT.  Refused nocturnal BiPAP 6/04 Refused nocturnal BiPAP 6/10 off CRRT 6/11 Intubated early am with hypercarbia, concern for RUL PNA; abx broadened; restarted on CRRT 6/16  No events overnight, weaning on high PS 6/17  failed vent wean, remains on levophed, midodrine and CRRT  Consults:  Cardiology PCCM  Nephrology   Procedures:  Lt PICC 5/29 >> OETT 5/30 >> 6/3; 6/11 R  IJ HD cath 5/30 >> Aline left ulnar 5/30 >> 6/2  Significant Diagnostic Tests:  TTE 5/28 >>The left ventricle has normal systolic function with an ejection fraction of 60-65%. The cavity size was normal. Left ventricular diastolic doppler parameters are indeterminate. No evidence of left ventricular regional wall motion abnormalities. The right ventricle has normal systolic function. The cavity was mildly enlarged. There is no increase in right ventricular wall thickness. Right atrial size was mildly dilated. The aortic valve is tricuspid. Mild thickening of the aortic valve. Mild calcification of the aortic valve. Aortic valve regurgitation is trivial by color flow Doppler.The aortic root is normal in size and structure.  Right heart cath 5/29 RA = 24 RV = 94/26 PA = 95/36 (53) PCW = 30 (v = 50) Fick cardiac output/index = 6.0/2.7 PVR =3.9 WU FA sat = 91% PA sat = 58%, 58% SVC 60%  CXR 6/15> Bilateral ASD LLL > R. ETT, GT, LIJ and RIJ CVCs stable position.   Micro Data:  SARS coronavirus 2 cepheid 5/28 >> neg MRSA PCR 5/28 >> neg Trach aspirate 6/2 >> MSSA BCx2 6/2 >> negative ...................... Tracheal aspirate 6/11 >> few GNR >> few proteus and few candida tropicalis BCx2 6/11 >> negative  Antimicrobials:  Vancomycin 6/3 > 6/4 Cefazolin 6/4 >> 6/10 ...........................Marland Kitchen Vanco 6/11 >> 6/17 Cefepime 6/11 >>  Interim history/subjective:  afebrile CVP 3, s/p 500 ml fluid bolus Levophed at 14 mcg/min Remains on CRRT at even balance Fentanyl gtt at 25 mcg/hr held since SBT Weaning since 9 am on PSV 8/5/ 40%  Objective   Blood pressure (!) 101/54, pulse 65, temperature 99 F (37.2 C), temperature source Oral, resp. rate (!) 26, height 5' (1.524 m), weight 99.3  kg, SpO2 98 %. CVP:  [0 mmHg-9 mmHg] 3 mmHg  Vent Mode: PSV;PRVC FiO2 (%):  [40 %-60 %] 40 % Set Rate:  [18 bmp] 18 bmp Vt Set:  [420 mL] 420 mL PEEP:  [5 cmH20] 5 cmH20 Pressure Support:  [8 cmH20] 8  cmH20 Plateau Pressure:  [16 cmH20-21 cmH20] 20 cmH20   Intake/Output Summary (Last 24 hours) at 10/13/2018 1143 Last data filed at 10/13/2018 1100 Gross per 24 hour  Intake 2193.33 ml  Output 1898 ml  Net 295.33 ml   Filed Weights   10/11/18 0405 10/12/18 0240 10/13/18 0500  Weight: 98.3 kg 96.1 kg 99.3 kg   General:  Morbidly obese, Ill appearing adult female in NAD in bed HEENT: MM pink/moist, ETT at 24 at lip, right nare cortrak, pupils 2, anicteric  Neuro: Somnolent, will open eyes and follow commands, MAE very weakly CV: IRIR- slow afib 60's PULM: lungs bilaterally clear, on PSV 8/5 w/RR ~30 GI: soft, non-tender, bs hypoactive  Extremities: warm/dry, LE wrapped in ace/ boots  Skin: ecchymosis to left PICC site, lower abd    LABS    PULMONARY Recent Labs  Lab 10/09/18 0445 10/10/18 0423 10/12/18 0433  PHART 7.379 7.384 7.368  PCO2ART 44.9 43.4 42.7  PO2ART 67.0* 69.0* 126.0*  HCO3 26.5 25.9 24.6  TCO2 28 27 26   O2SAT 92.0 93.0 99.0    CBC Recent Labs  Lab 10/11/18 0418 10/12/18 0430 10/12/18 0433 10/13/18 0259  HGB 11.5* 12.2 15.6* 11.1*  HCT 38.2 38.9 46.0 36.2  WBC 17.6* 22.9*  --  20.4*  PLT 170 PLATELET CLUMPS NOTED ON SMEAR, COUNT APPEARS DECREASED  --  118*    COAGULATION Recent Labs  Lab 10/13/18 0259  INR 1.2    CARDIAC   Recent Labs  Lab 10/13/18 0259  TROPONINI 0.05*   No results for input(s): PROBNP in the last 168 hours.   CHEMISTRY Recent Labs  Lab 10/09/18 0308  10/10/18 0418  10/10/18 1637 10/11/18 0418 10/12/18 0430 10/12/18 0433 10/12/18 1640 10/13/18 0259 10/13/18 0315  NA 134*   < > 134*   < > 135 134* 134* 136 133*  --  133*  K 4.3   < > 4.2   < > 4.4 4.6 4.6 4.4 4.3  --  4.9  CL 99   < > 101  --  102 100 102  --  102  --  102  CO2 24   < > 23  --  21* 22 21*  --  19*  --  19*  GLUCOSE 343*   < > 328*  --  307* 308* 267*  --  260*  --  308*  BUN 39*   < > 46*  --  44* 42* 44*  --  46*  --  48*  CREATININE  1.96*   < > 1.97*  --  1.69* 1.65* 1.69*  --  1.76*  --  1.87*  CALCIUM 8.3*   < > 8.4*  --  8.4* 8.6* 8.9  --  8.3*  --  8.0*  MG 2.8*  --  2.8*  --   --  2.7* 2.8*  --   --  2.6*  --   PHOS 2.1*   < > 2.2*   2.2*  --  3.7 2.8 2.8  --  2.8  --  2.9   < > = values in this interval not displayed.   Estimated Creatinine Clearance: 31.3 mL/min (A) (by C-G formula  based on SCr of 1.87 mg/dL (H)).   LIVER Recent Labs  Lab 10/10/18 1043  10/11/18 0418 10/12/18 0430 10/12/18 1640 10/13/18 0259 10/13/18 0315  AST 33  --   --   --   --  38  --   ALT 7  --   --   --   --  23  --   ALKPHOS 120  --   --   --   --  88  --   BILITOT 1.1  --   --   --   --  1.6*  --   PROT 7.6  --   --   --   --  7.5  --   ALBUMIN 2.1*   < > 2.4* 2.6* 2.5* 2.4* 2.3*  INR  --   --   --   --   --  1.2  --    < > = values in this interval not displayed.     INFECTIOUS Recent Labs  Lab 10/13/18 0259 10/13/18 0315  LATICACIDVEN  --  2.0*  PROCALCITON 0.19  --      ENDOCRINE CBG (last 3)  Recent Labs    10/13/18 0419 10/13/18 0754 10/13/18 1123  GLUCAP 287* 244* 229*   IMAGING x48h  Dg Chest Port 1 View  Result Date: 10/13/2018 CLINICAL DATA:  ETT present ,resp failure EXAM: PORTABLE CHEST 1 VIEW COMPARISON:  Chest x-rays dated 10/12/2018, 10/11/2018 and 10/09/2018. FINDINGS: Mild cardiomegaly is stable. Overall cardiomediastinal silhouette is stable. Endotracheal tube is well positioned with tip just above the level of the carina. Enteric tube passes below the diaphragm. RIGHT subclavian central line is stable in position with tip at the level of the mid SVC. Continued central pulmonary vascular congestion and mild bilateral interstitial edema, unchanged compared to yesterday's exam, improved compared to the earlier study of 10/09/2018. Probable small LEFT pleural effusion. No pneumothorax seen. IMPRESSION: 1. Endotracheal tube well positioned with tip just above the level of the carina. 2. Continued  central pulmonary vascular congestion and mild bilateral interstitial edema, unchanged compared to yesterday's exam, improved compared to yesterday's exam. 3. Probable small LEFT pleural effusion. Electronically Signed   By: Franki Cabot M.D.   On: 10/13/2018 08:33   Dg Chest Port 1 View  Result Date: 10/12/2018 CLINICAL DATA:  Respiratory failure, ventilatory support EXAM: PORTABLE CHEST 1 VIEW COMPARISON:  10/11/2018 FINDINGS: Endotracheal tube 5.2 cm above the carina. Feeding tube enters the stomach with the tip not visualized on the film. Right IJ dialysis catheter tip mid SVC level. Left upper extremity PICC line tip proximal SVC level. Minimal improvement in the diffuse airspace process versus edema. Patchy right mid lung and bibasilar opacities persist concerning for residual pneumonia. No large effusion or pneumothorax. Heart remains enlarged. IMPRESSION: Slight improvement in bilateral asymmetric airspace process versus edema. Stable support apparatus Electronically Signed   By: Jerilynn Mages.  Shick M.D.   On: 10/12/2018 08:14   Resolved Hospital Problem list    Assessment & Plan:  ASSESSMENT / PLAN:  PULMONARY A:  Baseline hx - OSA/OHV - non compliant  Acute resp failure following cardiac arrest HCAP  P:   Full vent support Currently weaning on PSV 8/5 for 2 hours, however she will need trach, is very deconditioned and will not extubate on levophed 14 mcg/min Will need trach, will work on scheduling and when abx as below   NEUROLOGIC A:   Acute Encephalopathy  P:   Seroquel 50mg  BID Clonazepam  1mg  BID Fent gtt, holding for now ICU delirium precaution RN intervention Abx as above   VASCULAR A:   Circulatory shock -  P:  Continue midodrone Weaning Levophed Continue stress dose steroids   CARDIAC A: PEA arrest : -suspect hypoxia driven Acute on chronic diastolic heart failure --admit with elevated filling pressures, cardiorenal syndrome  Pulmonary hypertension, severe :  -s/p RHC, WHO class 2,3 Permanent AF/AFl - -on anticoagulation pre admit NSVT/VT - -off amio due to bradycardia P: Per HF team    INFECTIOUS A:   MSSA PNA  6/2 Proteus VAP 6/11 P:   Day 8/x cefepime  RENAL A:  Cardiorenal syndrome AKI/ CKD 3-4 P:  CRRT per nephrology, ?may come off this afternoon   ENDOCRINE A:   Morbid OBestity DM- ongoing hyperglycemia 6/18  P:   Levemir increased to 30 units BID TF coverage 10 units q 4 SSI resistant   HEMATOLOGIC A:  At risk anemia critical illness P:  Iron per Nephrology Trend CBC  GI A:   Tolerting TF Constipation- No BM since the 6/3? P:   TF ppi Initiate scheduled bowel regimen  MSK/DERM RT hip blister - 6/17 P  - skin dressing per RN, no issues per RN  Best practice:  Diet: EN  Pain/Anxiety/Delirium protocol (if indicated):  In place  VAP protocol (if indicated): In place  DVT prophylaxis: Heparin gtt GI prophylaxis: Pepcid Glucose control: SSI Mobility: Bed Code Status: Full Family Communication: Pending  Disposition: ICU   CCT 35 mins  Kennieth Rad, MSN, AGACNP-BC  Pulmonary & Critical Care Pgr: [phone] or if no answer [phone] 10/13/2018, 11:44 AM

## 2018-10-14 ENCOUNTER — Inpatient Hospital Stay (HOSPITAL_COMMUNITY): Payer: Medicare HMO

## 2018-10-14 LAB — GLUCOSE, CAPILLARY
Glucose-Capillary: 160 mg/dL — ABNORMAL HIGH (ref 70–99)
Glucose-Capillary: 168 mg/dL — ABNORMAL HIGH (ref 70–99)
Glucose-Capillary: 134 mg/dL — ABNORMAL HIGH (ref 70–99)
Glucose-Capillary: 159 mg/dL — ABNORMAL HIGH (ref 70–99)
Glucose-Capillary: 187 mg/dL — ABNORMAL HIGH (ref 70–99)

## 2018-10-14 LAB — CBC
HCT: 39.7 % (ref 36.0–46.0)
Hemoglobin: 11.5 g/dL — ABNORMAL LOW (ref 12.0–15.0)
MCH: 26 pg (ref 26.0–34.0)
MCHC: 29 g/dL — ABNORMAL LOW (ref 30.0–36.0)
MCV: 89.6 fL (ref 80.0–100.0)
Platelets: 127 10*3/uL — ABNORMAL LOW (ref 150–400)
RBC: 4.43 MIL/uL (ref 3.87–5.11)
RDW: 24.5 % — ABNORMAL HIGH (ref 11.5–15.5)
WBC: 21.2 10*3/uL — ABNORMAL HIGH (ref 4.0–10.5)
nRBC: 2.3 % — ABNORMAL HIGH (ref 0.0–0.2)

## 2018-10-14 LAB — RENAL FUNCTION PANEL
Albumin: 2.5 g/dL — ABNORMAL LOW (ref 3.5–5.0)
Albumin: 2.2 g/dL — ABNORMAL LOW (ref 3.5–5.0)
Anion gap: 12 (ref 5–15)
Anion gap: 11 (ref 5–15)
BUN: 38 mg/dL — ABNORMAL HIGH (ref 8–23)
BUN: 38 mg/dL — ABNORMAL HIGH (ref 8–23)
CO2: 22 mmol/L (ref 22–32)
CO2: 23 mmol/L (ref 22–32)
Calcium: 8.4 mg/dL — ABNORMAL LOW (ref 8.9–10.3)
Calcium: 7.8 mg/dL — ABNORMAL LOW (ref 8.9–10.3)
Chloride: 101 mmol/L (ref 98–111)
Chloride: 102 mmol/L (ref 98–111)
Creatinine, Ser: 1.7 mg/dL — ABNORMAL HIGH (ref 0.44–1.00)
Creatinine, Ser: 1.61 mg/dL — ABNORMAL HIGH (ref 0.44–1.00)
GFR calc Af Amer: 36 mL/min — ABNORMAL LOW (ref >60)
GFR calc Af Amer: 38 mL/min — ABNORMAL LOW (ref >60)
GFR calc non Af Amer: 31 mL/min — ABNORMAL LOW (ref >60)
GFR calc non Af Amer: 33 mL/min — ABNORMAL LOW (ref >60)
Glucose, Bld: 186 mg/dL — ABNORMAL HIGH (ref 70–99)
Glucose, Bld: 166 mg/dL — ABNORMAL HIGH (ref 70–99)
Phosphorus: 3.1 mg/dL (ref 2.5–4.6)
Phosphorus: 4.9 mg/dL — ABNORMAL HIGH (ref 2.5–4.6)
Potassium: 5.1 mmol/L (ref 3.5–5.1)
Potassium: 4.1 mmol/L (ref 3.5–5.1)
Sodium: 135 mmol/L (ref 135–145)
Sodium: 136 mmol/L (ref 135–145)

## 2018-10-14 LAB — HEPARIN LEVEL (UNFRACTIONATED)
Heparin Unfractionated: <0.1 IU/mL — ABNORMAL LOW (ref 0.30–0.70)
Heparin Unfractionated: 0.34 IU/mL (ref 0.30–0.70)

## 2018-10-14 LAB — PROCALCITONIN: Procalcitonin: 0.21 ng/mL

## 2018-10-14 LAB — MAGNESIUM: Magnesium: 2.8 mg/dL — ABNORMAL HIGH (ref 1.7–2.4)

## 2018-10-14 MED ORDER — HEPARIN (PORCINE) 25000 UT/250ML-% IV SOLN
1650.0000 [IU]/h | INTRAVENOUS | Status: DC
  Administered 2018-10-15 – 2018-10-16 (×2): 1100 [IU]/h via INTRAVENOUS
  Administered 2018-10-16 – 2018-10-19 (×4): 1200 [IU]/h via INTRAVENOUS
  Administered 2018-10-20 – 2018-10-21 (×4): 1350 [IU]/h via INTRAVENOUS
  Administered 2018-10-22 (×2): 1500 [IU]/h via INTRAVENOUS
  Administered 2018-10-23 – 2018-10-24 (×2): 1650 [IU]/h via INTRAVENOUS
  Filled 2018-10-14 (×12): qty 250

## 2018-10-14 MED ORDER — BISACODYL 10 MG RE SUPP
10.0000 mg | Freq: Once | RECTAL | Status: AC
  Administered 2018-10-14: 10 mg via RECTAL
  Filled 2018-10-14: qty 1

## 2018-10-14 MED ORDER — MIDAZOLAM HCL 2 MG/2ML IJ SOLN
1.0000 mg | Freq: Once | INTRAMUSCULAR | Status: AC
  Administered 2018-10-14: 12:00:00 1 mg via INTRAVENOUS

## 2018-10-14 MED ORDER — FENTANYL CITRATE (PF) 100 MCG/2ML IJ SOLN
100.0000 ug | Freq: Once | INTRAMUSCULAR | Status: AC
  Administered 2018-10-14: 12:00:00 100 ug via INTRAVENOUS

## 2018-10-14 MED ORDER — FENTANYL CITRATE (PF) 100 MCG/2ML IJ SOLN
INTRAMUSCULAR | Status: AC
  Administered 2018-10-14: 100 ug via INTRAVENOUS
  Filled 2018-10-14: qty 2

## 2018-10-14 MED ORDER — ROCURONIUM BROMIDE 50 MG/5ML IV SOLN
50.0000 mg | Freq: Once | INTRAVENOUS | Status: AC
  Administered 2018-10-14: 12:00:00 50 mg via INTRAVENOUS
  Filled 2018-10-14: qty 5

## 2018-10-14 MED ORDER — LIDOCAINE HCL (PF) 1 % IJ SOLN
INTRAMUSCULAR | Status: AC
  Filled 2018-10-14: qty 5

## 2018-10-14 MED ORDER — MIDAZOLAM HCL 2 MG/2ML IJ SOLN
INTRAMUSCULAR | Status: AC
  Filled 2018-10-14: qty 2

## 2018-10-14 MED ORDER — MIDAZOLAM HCL 2 MG/2ML IJ SOLN
INTRAMUSCULAR | Status: AC
  Administered 2018-10-14: 12:00:00 1 mg via INTRAVENOUS
  Filled 2018-10-14: qty 2

## 2018-10-14 MED ORDER — ETOMIDATE 2 MG/ML IV SOLN
20.0000 mg | Freq: Once | INTRAVENOUS | Status: AC
  Administered 2018-10-14: 12:00:00 20 mg via INTRAVENOUS

## 2018-10-14 MED ORDER — HEPARIN BOLUS VIA INFUSION
2000.0000 [IU] | Freq: Once | INTRAVENOUS | Status: AC
  Administered 2018-10-14: 2000 [IU] via INTRAVENOUS
  Filled 2018-10-14: qty 2000

## 2018-10-14 MED ORDER — FENTANYL CITRATE (PF) 100 MCG/2ML IJ SOLN
INTRAMUSCULAR | Status: AC
  Filled 2018-10-14: qty 2

## 2018-10-14 NOTE — Progress Notes (Signed)
Patient ID: Kathleen [name], female   DOB: [birth year], 68 y.o.   MRN: [id number]    Advanced Heart Failure Rounding Note   Subjective:     Wide awake on vent. Following commands Still on CVVHD. Got 500cc IVF back yesterday. Now running even. CVP line not working.   Remains on NE 15-20.   Failed vent wean this am.    Objective:   Weight Range:  Vital Signs:   Temp:  [97 F (36.1 C)-99.5 F (37.5 C)] 97 F (36.1 C) (06/19 1231) Pulse Rate:  [42-61] 55 (06/19 1400) Resp:  [11-31] 19 (06/19 1400) BP: (81-116)/(46-70) 107/56 (06/19 1400) SpO2:  [89 %-100 %] 98 % (06/19 1400) FiO2 (%):  [50 %-80 %] 60 % (06/19 1153) Weight:  [97.3 kg] 97.3 kg (06/19 0500) Last BM Date: 10/03/18  Weight change: Filed Weights   10/12/18 0240 10/13/18 0500 10/14/18 0500  Weight: 96.1 kg 99.3 kg 97.3 kg    Intake/Output:   Intake/Output Summary (Last 24 hours) at 10/14/2018 1505 Last data filed at 10/14/2018 1400 Gross per 24 hour  Intake 785.48 ml  Output 826 ml  Net -40.52 ml     Physical Exam: General:  On vent.  Awake. Following commands HEENT: normal Neck: supple. Hard to see JVP. Carotids 2+ bilat; no bruits. No lymphadenopathy or thryomegaly appreciated. Cor: PMI nondisplaced. Irregular rate & rhythm. No rubs, gallops or murmurs. + perm cath Lungs: clear Abdomen: obese soft, nontender, nondistended. No hepatosplenomegaly. No bruits or masses. Good bowel sounds. Extremities: no cyanosis, clubbing, rash, tr edema Neuro: awake on vent. Follows commands   Telemetry: AF 50-60s. Personally reviewed   Labs: Basic Metabolic Panel: Recent Labs  Lab 10/10/18 0418  10/11/18 0418 10/12/18 0430 10/12/18 0433 10/12/18 1640 10/13/18 0259 10/13/18 0315 10/13/18 1555 10/14/18 0600  NA 134*   < > 134* 134* 136 133*  --  133* 135 135  K 4.2   < > 4.6 4.6 4.4 4.3  --  4.9 5.8* 5.1  CL 101   < > 100 102  --  102  --  102 105 101  CO2 23   < > 22 21*  --  19*  --  19* 20* 22  GLUCOSE  328*   < > 308* 267*  --  260*  --  308* 163* 186*  BUN 46*   < > 42* 44*  --  46*  --  48* 43* 38*  CREATININE 1.97*   < > 1.65* 1.69*  --  1.76*  --  1.87* 1.64* 1.70*  CALCIUM 8.4*   < > 8.6* 8.9  --  8.3*  --  8.0* 8.7* 8.4*  MG 2.8*  --  2.7* 2.8*  --   --  2.6*  --   --  2.8*  PHOS 2.2*  2.2*   < > 2.8 2.8  --  2.8  --  2.9 2.7 3.1   < > = values in this interval not displayed.    Liver Function Tests: Recent Labs  Lab 10/10/18 1043  10/12/18 1640 10/13/18 0259 10/13/18 0315 10/13/18 1555 10/14/18 0600  AST 33  --   --  38  --   --   --   ALT 7  --   --  23  --   --   --   ALKPHOS 120  --   --  88  --   --   --   BILITOT 1.1  --   --  1.6*  --   --   --   PROT 7.6  --   --  7.5  --   --   --   ALBUMIN 2.1*   < > 2.5* 2.4* 2.3* 2.4* 2.5*   < > = values in this interval not displayed.   No results for input(s): LIPASE, AMYLASE in the last 168 hours. Recent Labs  Lab 10/10/18 1043  AMMONIA 39*    CBC: Recent Labs  Lab 10/10/18 0418  10/11/18 0418 10/12/18 0430 10/12/18 0433 10/13/18 0259 10/14/18 0600  WBC 13.1*  --  17.6* 22.9*  --  20.4* 21.2*  HGB 9.5*   < > 11.5* 12.2 15.6* 11.1* 11.5*  HCT 30.7*   < > 38.2 38.9 46.0 36.2 39.7  MCV 87.5  --  87.2 88.4  --  91.4 89.6  PLT 124*  --  170 PLATELET CLUMPS NOTED ON SMEAR, COUNT APPEARS DECREASED  --  118* 127*   < > = values in this interval not displayed.    Cardiac Enzymes: Recent Labs  Lab 10/13/18 0259  TROPONINI 0.05*    BNP: BNP (last 3 results) Recent Labs    09/22/18 1420  BNP 451.2*    ProBNP (last 3 results) No results for input(s): PROBNP in the last 8760 hours.    Other results:  Imaging: Dg Chest Port 1 View  Result Date: 10/14/2018 CLINICAL DATA:  68 year old female with tracheostomy tube placement EXAM: PORTABLE CHEST 1 VIEW COMPARISON:  10/13/2018, 10/12/2018, 10/11/2018 FINDINGS: Cardiomediastinal silhouette unchanged in size and contour. Low lung volumes accentuate the  interstitium and the vasculature. Interval removal of endotracheal tube in placement of tracheostomy which terminates at the level of the clavicular heads in the midline. Unchanged position of left upper extremity PICC with the catheter terminating in the superior vena cava. Unchanged right IJ central venous catheter with the tip appearing to terminate at the superior cavoatrial junction. Enteric feeding tube terminates out of the field of view. Reticular opacities at the lung bases with no interlobular septal thickening. No pneumothorax. IMPRESSION: Interval placement of tracheostomy tube and removal of endotracheal tube. Low lung volumes with likely atelectasis/consolidation. Unchanged right IJ hemodialysis catheter, left upper extremity PICC, and enteric feeding tube. Electronically Signed   By: Corrie Mckusick D.O.   On: 10/14/2018 12:37   Dg Chest Port 1 View  Result Date: 10/13/2018 CLINICAL DATA:  ETT present ,resp failure EXAM: PORTABLE CHEST 1 VIEW COMPARISON:  Chest x-rays dated 10/12/2018, 10/11/2018 and 10/09/2018. FINDINGS: Mild cardiomegaly is stable. Overall cardiomediastinal silhouette is stable. Endotracheal tube is well positioned with tip just above the level of the carina. Enteric tube passes below the diaphragm. RIGHT subclavian central line is stable in position with tip at the level of the mid SVC. Continued central pulmonary vascular congestion and mild bilateral interstitial edema, unchanged compared to yesterday's exam, improved compared to the earlier study of 10/09/2018. Probable small LEFT pleural effusion. No pneumothorax seen. IMPRESSION: 1. Endotracheal tube well positioned with tip just above the level of the carina. 2. Continued central pulmonary vascular congestion and mild bilateral interstitial edema, unchanged compared to yesterday's exam, improved compared to yesterday's exam. 3. Probable small LEFT pleural effusion. Electronically Signed   By: Franki Cabot M.D.   On:  10/13/2018 08:33     Medications:     Scheduled Medications: . atorvastatin  40 mg Oral q1800  . bisacodyl  10 mg Rectal Once  . chlorhexidine gluconate (MEDLINE KIT)  15  mL Mouth Rinse BID  . Chlorhexidine Gluconate Cloth  6 each Topical Daily  . clonazePAM  1 mg Per Tube BID  . feeding supplement (PRO-STAT SUGAR FREE 64)  30 mL Per Tube QID  . feeding supplement (VITAL 1.5 CAL)  1,000 mL Per Tube Q24H  . hydrocortisone sodium succinate  50 mg Intravenous Q6H  . insulin aspart  0-20 Units Subcutaneous Q4H  . insulin aspart  10 Units Subcutaneous Q4H  . insulin detemir  30 Units Subcutaneous BID  . mouth rinse  15 mL Mouth Rinse 10 times per day  . midodrine  10 mg Oral TID WC  . polyethylene glycol  17 g Oral Daily  . QUEtiapine  50 mg Oral BID  . sodium chloride flush  10-40 mL Intracatheter Q12H    Infusions: .  prismasol BGK 4/2.5 500 mL/hr at 10/13/18 2332  .  prismasol BGK 4/2.5 300 mL/hr at 10/13/18 0215  . sodium chloride    . sodium chloride Stopped (10/11/18 1907)  . famotidine (PEPCID) IV Stopped (10/13/18 1424)  . fentaNYL infusion INTRAVENOUS 25 mcg/hr (10/14/18 1400)  . heparin 1,100 Units/hr (10/14/18 1223)  . norepinephrine (LEVOPHED) Adult infusion 24 mcg/min (10/14/18 1400)  . prismasol BGK 4/2.5 1,500 mL/hr at 10/14/18 1337  . sodium chloride 999 mL/hr at 10/06/18 1018    PRN Medications: Place/Maintain arterial line **AND** sodium chloride, sodium chloride, acetaminophen (TYLENOL) oral liquid 160 mg/5 mL, bisacodyl, docusate, heparin, midazolam, ondansetron, rocuronium, sodium chloride, sodium chloride flush   Assessment/Plan:   1. PEA arrest on 5/30 - due to hypoxia/pulmonary edema. - now stable  2. Acute on chronic hypoxic respiratory failure - has underlying  OHS/OSA  - intubated on 5/30 in setting of arrest.  - treated for MSSA PNA - extubated 6/3.  - reintubated 6/11 with worsening RUL aispace disease.  - abx broadened to cefipime/vanc.   - PNA and underlying OHS likely main barriers to extubation. Volume status currently looks good. Failed wean this am  - For trach today  - D/w CCM  3.  Acute on chronic diastolic HF - Admitted with markedly elevated filling pressures complicated by severe cardiorenal syndrome - Now down 89 pounds with CVVHD. Suspect this as likely as good as we can get her volume status. See discussion above.  - Volume management per Renal and CVVHD.  - ? Need to w/u for possible TTR amyloid  4. Pulmonary HTN, severe  - this is mostly pulmonary venous HTN by cath [facility] Group 2) but also has a component of OHS/OSA (WHO group 3) - no role for selective pulmonary vasodilators  5. AKI on CKD 3 - baseline creatinine 1.9 -> ESRD - likely cardiorenal - We have suspected that she may progress to long-term HD. Remains anuric - Remains on CVVHD. Appreciate Dr. Deterding's management  6. Morbid obesity - needs weight loss  6. Chronic AF/AFL - Off apixaban. Now on heparin. No bleeding. Discussed dosing with PharmD personally. - Rhythm is AF today - No role for PPM currently  7. VT/NSVT - Quiescent.  off amio due to bradycardia - Keep K> 4.0 Mg 2.0  8. MSSA PNA - Bcx drawn 6/2 & 6/11. NGTD - sputum cx with MSSA PNA - CXR with worsening RUL PNA. Cefazolin switched to cefipime/vanc  9. Hypotension - ?  Due to sepsis.  - Now on midodrine 10 tid and NE 15 Continue to wean as tolerated. CVP 6.  May need to let volume status rise a bit  to permit pressor wean.    CRITICAL CARE Performed by: Glori Bickers  Total critical care time: 35 minutes  Critical care time was exclusive of separately billable procedures and treating other patients.  Critical care was necessary to treat or prevent imminent or life-threatening deterioration.  Critical care was time spent personally by me (independent of midlevel providers or residents) on the following activities: development of treatment plan with patient  and/or surrogate as well as nursing, discussions with consultants, evaluation of patient's response to treatment, examination of patient, obtaining history from patient or surrogate, ordering and performing treatments and interventions, ordering and review of laboratory studies, ordering and review of radiographic studies, pulse oximetry and re-evaluation of patient's condition.   Length of Stay: 22  Glori Bickers MD 10/14/2018, 3:05 PM  Advanced Heart Failure Team Pager [phone] (M-F; 7a - 4p)  Please contact [facility] for night-coverage after hours (4p -7a ) and weekends on [email or web address]

## 2018-10-14 NOTE — Progress Notes (Signed)
[facility]    NEPHROLOGY PROGRESS NOTE  SUBJECTIVE: Receiving tracheostomy at the bedside at the time of my visit.  Chart reviewed extensively.  No acute events noted.   OBJECTIVE:  Vitals:   10/14/18 1153 10/14/18 1231  BP:    Pulse:    Resp: 18   Temp:  (!) 97 F (36.1 C)  SpO2: 95%     Intake/Output Summary (Last 24 hours) at 10/14/2018 1326 Last data filed at 10/14/2018 1000 Gross per 24 hour  Intake 858.68 ml  Output 1125 ml  Net -266.32 ml      General: Sedated NAD HEENT: MMM  AT anicteric sclera Neck:  No JVD, no adenopathy CV:  Heart RRR  Lungs:  L/S CTA bilaterally Abd:  abd SNT/ND with normal BS GU:  Bladder non-palpable Extremities: + bilateral LE edema. Skin:  No skin rash  MEDICATIONS:  . atorvastatin  40 mg Oral q1800  . bisacodyl  10 mg Rectal Once  . chlorhexidine gluconate (MEDLINE KIT)  15 mL Mouth Rinse BID  . Chlorhexidine Gluconate Cloth  6 each Topical Daily  . clonazePAM  1 mg Per Tube BID  . feeding supplement (PRO-STAT SUGAR FREE 64)  30 mL Per Tube QID  . feeding supplement (VITAL 1.5 CAL)  1,000 mL Per Tube Q24H  . hydrocortisone sodium succinate  50 mg Intravenous Q6H  . insulin aspart  0-20 Units Subcutaneous Q4H  . insulin aspart  10 Units Subcutaneous Q4H  . insulin detemir  30 Units Subcutaneous BID  . mouth rinse  15 mL Mouth Rinse 10 times per day  . midodrine  10 mg Oral TID WC  . polyethylene glycol  17 g Oral Daily  . QUEtiapine  50 mg Oral BID  . sodium chloride flush  10-40 mL Intracatheter Q12H       LABS:   CBC Latest Ref Rng & Units 10/14/2018 10/13/2018 10/12/2018  WBC 4.0 - 10.5 K/uL 21.2(H) 20.4(H) -  Hemoglobin 12.0 - 15.0 g/dL 11.5(L) 11.1(L) 15.6(H)  Hematocrit 36.0 - 46.0 % 39.7 36.2 46.0  Platelets 150 - 400 K/uL 127(L) 118(L) -    CMP Latest Ref Rng & Units 10/14/2018 10/13/2018 10/13/2018  Glucose 70 - 99 mg/dL 186(H) 163(H) 308(H)  BUN 8 - 23 mg/dL 38(H) 43(H) 48(H)  Creatinine 0.44  - 1.00 mg/dL 1.70(H) 1.64(H) 1.87(H)  Sodium 135 - 145 mmol/L 135 135 133(L)  Potassium 3.5 - 5.1 mmol/L 5.1 5.8(H) 4.9  Chloride 98 - 111 mmol/L 101 105 102  CO2 22 - 32 mmol/L 22 20(L) 19(L)  Calcium 8.9 - 10.3 mg/dL 8.4(L) 8.7(L) 8.0(L)  Total Protein 6.5 - 8.1 g/dL - - -  Total Bilirubin 0.3 - 1.2 mg/dL - - -  Alkaline Phos 38 - 126 U/L - - -  AST 15 - 41 U/L - - -  ALT 0 - 44 U/L - - -    Lab Results  Component Value Date   PTH 33 10/12/2018   CALCIUM 8.4 (L) 10/14/2018   CAION 1.18 10/12/2018   PHOS 3.1 10/14/2018       Component Value Date/Time   COLORURINE AMBER (A) 05/22/2013 1826   APPEARANCEUR HAZY (A) 05/22/2013 1826   LABSPEC 1.024 05/22/2013 1826   PHURINE 6.0 05/22/2013 1826   GLUCOSEU NEGATIVE 05/22/2013 1826   HGBUR NEGATIVE 05/22/2013 1826   BILIRUBINUR SMALL (A) 05/22/2013 1826   KETONESUR NEGATIVE 05/22/2013 1826   PROTEINUR 100 (A) 05/22/2013 1826   UROBILINOGEN 4.0 (  H) 05/22/2013 1826   NITRITE NEGATIVE 05/22/2013 1826   LEUKOCYTESUR NEGATIVE 05/22/2013 1826      Component Value Date/Time   PHART 7.368 10/12/2018 0433   PCO2ART 42.7 10/12/2018 0433   PO2ART 126.0 (H) 10/12/2018 0433   HCO3 24.6 10/12/2018 0433   TCO2 26 10/12/2018 0433   ACIDBASEDEF 1.0 10/12/2018 0433   O2SAT 99.0 10/12/2018 0433       Component Value Date/Time   IRON 44 10/12/2018 1640   TIBC 379 10/12/2018 1640   IRONPCTSAT 12 10/12/2018 1640       ASSESSMENT/PLAN:    68-year-old female patient with chronic diastolic heart failure who failed outpatient diuretic therapy underwent a right heart catheterization on 5/29 showing markedly elevated biventricular filling pressures and had a subsequent PEA arrest on 5/30.  She was intubated and ROSC was achieved.  She was started on CRRT on 5/31 for acute kidney injury.  She has had an ongoing pressor requirement.  1.  Chronic kidney disease stage III with a baseline serum creatinine around 1.7-1.9.  I suspect her chronic kidney  disease is on the basis of longstanding hypertension and diabetes.  Intact PTH 33 on 10/12/2018.  2.  Acute kidney injury.  Likely ATN in the setting of shock.  Has had no recovery of renal function yet.  Remains on CRRT.  Chest x-ray consistent with pulmonary congestion.  Continues on CRRT due to pressor requirement.  Required IV fluids yesterday due to a CVP of 3.  We will continue to maintain euvolemia with CRRT.  She has lost about 39 kg to date from CRRT.  Phosphorus stable on CRRT.  3.  Chronic respiratory failure.  Status post tracheostomy today.  6.  Chronic diastolic heart failure.  Appears relatively euvolemic.      , DO, FACP  

## 2018-10-14 NOTE — Procedures (Signed)
Procedure: Percutaneous Tracheostomy CPT 31600 Performed by: Dr. Erskine Emery Bronchoscopy Assistant: NP Moshe Cipro.  Indications: Chronic respiratory failure and need for ongoing mechanical ventilation.  Consent: Given patient's intubation and sedation, the patient was unable to provide consent. Discussed the procedure with the patient's decision maker, including the indications, risks, benefits, and alternatives. All questions were answered. Verbal witnessed consent was obtained and placed in the chart.  Preprocedure: Universal protocol was followed for this procedure. Prior to the initiation of sedation or the procedure, a timeout/"Pause for the Cause" was performed. The patient's identity was verified by confirming the patient's wrist band for name, date of birth, and medical record number. Everyone in the room was in agreement with the patient identify, the procedure to be performed, consent was in place and matched the planned procedure, and the procedure site. The area was cleaned with a CHG scrub and draped with large sterile barrier. Hand hygiene was performed, and cap, mask, sterile gown, and sterile gloves were worn. The patient was covered by a large sterile drape. Sterile technique was maintained for the entire procedure.  Anesthesia: The patient was intubated and sedated prior to the procedure. Additional midazolam, etomidate, fentanyl and rocuronium were given for sedation and paralysis with close attention to vital signs throughout procedure. Please refer to the accompanying procedural sedation form for additional details. Once the patient was adequately sedated and with continuous BIS monitoring, vecuronium was administered for paralysis.  Procedure: The patient was placed in the supine position. The anterior neck was prepped and draped in usual sterile fashion. 1% lidocaine was administered approximately 2 fingerbreadths above the sternal notch for local anesthesia. A 1.5-cm vertical incision  was then performed 2 fingerbreadths above the sternal notch. Using a curved Kelly, blunt dissection was performed down to the level of the pretracheal fascia. At this point, the bronchoscope was introduced through the endotracheal tube and the trachea was properly visualized. The endotracheal tube was then gradually withdrawn within the trachea under direct bronchoscopic visualization. Proper midline position was confirmed by bouncing the needle from the tracheostomy tray over the trachea with bronchoscopic examination. The needle was advanced into the trachea and proper positioning was confirmed with direct visualization. The needle was then removed leaving a white outer cannula in position. The wire from the tracheostomy tray was then advanced through the white outer cannula. The cannula was then removed. The small, blue dilator was then advanced over the wire into the trachea. Once proper dilatation was achieved, the dilator was removed. The large, tapered dilator was then advanced over the wire into the trachea. The dilator was removed leaving the wire and white inner cannula in position. A number 8-0 percutaneous Shiley tracheostomy tube was then advanced over the wire and white inner cannula into the trachea. Proper positioning was confirmed with bronchoscopic visualization. The tracheostomy tube was then sutured in place with four nylon sutures. It was further secured with a tracheostomy tie.  Estimated blood loss: Less than 5 mL.  Complications: None immediate.  CXR ordered.

## 2018-10-14 NOTE — Progress Notes (Signed)
Nutrition Follow-up  DOCUMENTATION CODES:   Morbid obesity  INTERVENTION:   -Monitor for BM results (day 10 without) -ContinueB-complex with Vit C  ContinueTube Feeding:  -Vital 1.5 @30ml  (713ml) via Cortrak -60 ml ProstatBID  Provides: 1480kcal, 109grams protein, 516ml free water. Meets 107% of kcal needs and 100% of protein needs.   NUTRITION DIAGNOSIS:   Inadequate oral intake related to inability to eat as evidenced by NPO status.  Ongoing  GOAL:   Provide needs based on ASPEN/SCCM guidelines  Addressed via TF  MONITOR:   Vent status, Labs, Weight trends, I & O's, TF tolerance  REASON FOR ASSESSMENT:   Consult Enteral/tube feeding initiation and management  ASSESSMENT:   68 year old with morbid obesity, acute on chronic diastolic heart failure, pulmonary hypertension transferred to the ICU after PEA arrest.  Suspect respiratory failure due to pulmonary edema   5/30 - s/p PEA arrest, intubated and transferred to ICU,CRRTinitiated 6/2- failed SBT, pulm edema 6/3 extubated, refused BiPAP 6/10- CRRT stopped  6/11- re-intubated, CRRT re-started 6/19- failed SBT, s/p trach    Spoke with RN via phone. Trach placement went well. Pt remains without BM x 10 days. Bowel sounds are active and pt is passing gas. Miralax and dulcolax given this am. Pt tolerating TF at goal. Will continue with current rate and monitor for BM results.   Admission weight: 134.8 kg Current weight 97.3 kg (down from 98.3 kg on 6/16)  Generalized +1 edema continues per nursing assessment.   Patient on ventilator support via trach  MV: 7.7 L/min Temp (24hrs), Avg:98.1 F (36.7 C), Min:97 F (36.1 C), Max:99.5 F (37.5 C)   I/O: -6,624 ml since 6/5 UOP: anuric CRRT: 1,574 ml x 24 hrs   Drips: levophed Medications: dulcolax, solu-cortef, SS novolog, levemir, midodrine, miralax Labs: Mg 2.8 (H) CBG 134-225  Diet Order:   Diet Order            Diet NPO time specified   Diet effective midnight              EDUCATION NEEDS:   No education needs have been identified at this time  Skin:  Skin Assessment: Skin Integrity Issues: Skin Integrity Issues:: Other (Comment), Incisions Incisions: R neck/chest Other: bilster to R hip, MASD back  Last BM:  6/8  Height:   Ht Readings from Last 1 Encounters:  10/05/18 5' (1.524 m)    Weight:   Wt Readings from Last 1 Encounters:  10/14/18 97.3 kg    Ideal Body Weight:  45.5 kg  BMI:  Body mass index is 41.89 kg/m.  Estimated Nutritional Needs:   Kcal:  1657-9038 kcal  Protein:  100-120 grams  Fluid:  >/= 1.4 L/day   Mariana Single RD, LDN Clinical Nutrition Pager # - [phone]

## 2018-10-14 NOTE — Progress Notes (Signed)
Patient attempted to self-extubate twice. Patient was educated on importance of ET tube and plan for next shift. Despite education patient still try to pull tube and lines out. Elink notified and RN advised to restart fentanyl gtt at a low dose with bolus. Will continue to monitor and turn off in the AM.

## 2018-10-14 NOTE — Progress Notes (Signed)
Renal Navigator has been following for appropriate time to refer to OP HD clinic. Renal Navigator notes that patient had trach placed today and, therefore, is not appropriate for OP HD placement at this time. Please re-consult Renal Navigator if patient becomes appropriate for OP HD treatment.  Alphonzo Cruise, [place] Renal Navigator [phone]

## 2018-10-14 NOTE — Progress Notes (Signed)
[place] for Heparin Indication: atrial fibrillation  Allergies  Allergen Reactions  . Doxycycline     REACTION: Wheals and pruritus    Patient Measurements: Height: 5' (152.4 cm) Weight: 214 lb 8.1 oz (97.3 kg) IBW/kg (Calculated) : 45.5  HEPARIN DW (KG): 71.8  Vital Signs: Temp: 98 F (36.7 C) (06/19 1520) Temp Source: Oral (06/19 1520) BP: 107/58 (06/19 1945) Pulse Rate: 47 (06/19 1945)  Labs: Recent Labs    10/12/18 0430 10/12/18 0433  10/13/18 0259  10/13/18 1555 10/14/18 0600 10/14/18 1611 10/14/18 1913  HGB 12.2 15.6*  --  11.1*  --   --  11.5*  --   --   HCT 38.9 46.0  --  36.2  --   --  39.7  --   --   PLT PLATELET CLUMPS NOTED ON SMEAR, COUNT APPEARS DECREASED  --   --  118*  --   --  127*  --   --   LABPROT  --   --   --  14.9  --   --   --   --   --   INR  --   --   --  1.2  --   --   --   --   --   HEPARINUNFRC 0.50  --   --  0.44  --   --  <0.10*  --  0.34  CREATININE 1.69*  --    < >  --    < > 1.64* 1.70* 1.61*  --   TROPONINI  --   --   --  0.05*  --   --   --   --   --    < > = values in this interval not displayed.    Estimated Creatinine Clearance: 35.9 mL/min (A) (by C-G formula based on SCr of 1.61 mg/dL (H)).  Assessment: Kathleen [name] is a 63yoF with a history of afib/aflutter taking Eliquis PTA. Her apixaban was initially continued while inpatient, last dose 5/29, now on hold for potential need for [facility], currently on CRRT.  Evening level therapeutic   Goal of Therapy:  Heparin level 0.3-0.7 units/ml  Monitor platelets by anticoagulation protocol: Yes   Plan:  Continue heparin at 1100 units / hr Follow up AM labs  Thank you for the interesting consult and for involving pharmacy in this patient's care.  Anette Guarneri, PharmD [phone]

## 2018-10-14 NOTE — Progress Notes (Signed)
RT NOTE: CPT held at this time. Patient just had tracheostomy procedure performed. RN is in with patient to insert suppository. CPT to resume at next scheduled time. Vitals are stable. RT will continue to monitor.

## 2018-10-14 NOTE — Progress Notes (Signed)
NAME:  Kathleen [name], MRN:  [id number], DOB:  1951-02-16, LOS: 73 ADMISSION DATE:  09/22/2018, CONSULTATION DATE: 09/24/2018 REFERRING MD: Candee Furbish MD, CHIEF COMPLAINT: Cardiac arrest  Brief History   68 y/o obese woman with chronic diastolic heart failure who failed outpatient diuretics management due to chronic kidney disease.  Right heart cath on 5/29 showed markedly elevated biventricular filling pressures with normal cardiac output.  She was on cardiology service, maintained on milrinone and Lasix.  On the morning of 5/30 she went into PEA arrest, CPR performed for 8 minutes.  Intubated and transferred to ICU.  Started on CRRT 5/31.  Extubated 6/3.  Re-intubated early am 6/11 for hypercarbic respiratory failure  Past Medical History   has a past medical history of Atrial flutter ([place]), Bradycardia, Chronic diastolic (congestive) heart failure ([place]) (01/10/2015), CKD (chronic kidney disease), stage IV ([place]), Degenerative joint disease of hand, Diabetes mellitus, Dyslipidemia, Fecal occult blood test positive, GERD (gastroesophageal reflux disease), Headache, Hypertension, Inadequate material resources, Irritable bowel syndrome, Morbid obesity ([place]), Obesity hypoventilation syndrome ([place]), Post-menopausal bleeding, and Shortness of breath dyspnea.  Significant Hospital Events   5/28 Admit 5/29 RHC 5/30 PEA arrest, intubated/ CRRT 5/31 Milrinone stopped due to ectopy; brady episode- amio stopped 6/02 Attempt at SBT, chest x-ray with worsening pulmonary edema, CVVHD for volume removal 6/03 Extubated > remain on CRRT.  Refused nocturnal BiPAP 6/04 Refused nocturnal BiPAP 6/10 off CRRT 6/11 Intubated early am with hypercarbia, concern for RUL PNA; abx broadened; restarted on CRRT 6/16  No events overnight, weaning on high PS 6/17  failed vent wean, remains on levophed, midodrine and CRRT 6/18  Weaned for 5 hours on PSV 8/5 before requiring full support; ongoing CRRT, remains on levophed 14  mcg  Consults:  Cardiology PCCM  Nephrology   Procedures:  Lt PICC 5/29 >> OETT 5/30 >> 6/3; 6/11 R IJ HD cath 5/30 >> Aline left ulnar 5/30 >> 6/2  Significant Diagnostic Tests:  TTE 5/28 >>The left ventricle has normal systolic function with an ejection fraction of 60-65%. The cavity size was normal. Left ventricular diastolic doppler parameters are indeterminate. No evidence of left ventricular regional wall motion abnormalities. The right ventricle has normal systolic function. The cavity was mildly enlarged. There is no increase in right ventricular wall thickness. Right atrial size was mildly dilated. The aortic valve is tricuspid. Mild thickening of the aortic valve. Mild calcification of the aortic valve. Aortic valve regurgitation is trivial by color flow Doppler.The aortic root is normal in size and structure.  Right heart cath 5/29 RA = 24 RV = 94/26 PA = 95/36 (53) PCW = 30 (v = 50) Fick cardiac output/index = 6.0/2.7 PVR =3.9 WU FA sat = 91% PA sat = 58%, 58% SVC 60%  CXR 6/15> Bilateral ASD LLL > R. ETT, GT, LIJ and RIJ CVCs stable position.   Micro Data:  SARS coronavirus 2 cepheid 5/28 >> neg MRSA PCR 5/28 >> neg Trach aspirate 6/2 >> MSSA BCx2 6/2 >> negative ...................... Tracheal aspirate 6/11 >> few GNR >> few proteus and few candida tropicalis BCx2 6/11 >> negative  Antimicrobials:  Vancomycin 6/3 > 6/4 Cefazolin 6/4 >> 6/10 ...........................Marland Kitchen Vanco 6/11 >> 6/17 Cefepime 6/11 >>6/18  Interim history/subjective:  Attempted self extubation several times overnight, fentanyl gtt was resumed.  NPO since midnight and heparin gtt held for possible tracheostomy today.   Remains on CRRT Weight down 2 kg/ I/O's +406 Levophed at 12 mcg/min  Failed PSV this am  after 30 mins due to low TVs and desaturation during my assessment.   Objective   Blood pressure (!) 101/51, pulse (!) 51, temperature (!) 97.1 F (36.2 C), temperature source  Axillary, resp. rate 17, height 5' (1.524 m), weight 97.3 kg, SpO2 100 %. CVP:  [3 mmHg-6 mmHg] 6 mmHg  Vent Mode: PRVC FiO2 (%):  [40 %-80 %] 60 % Set Rate:  [18 bmp] 18 bmp Vt Set:  [420 mL] 420 mL PEEP:  [5 cmH20] 5 cmH20 Pressure Support:  [8 cmH20] 8 cmH20 Plateau Pressure:  [15 cmH20-16 cmH20] 16 cmH20   Intake/Output Summary (Last 24 hours) at 10/14/2018 0759 Last data filed at 10/14/2018 0700 Gross per 24 hour  Intake 1980.01 ml  Output 1574 ml  Net 406.01 ml   Filed Weights   10/12/18 0240 10/13/18 0500 10/14/18 0500  Weight: 96.1 kg 99.3 kg 97.3 kg   General:  Morbidly obese female on MV in NAD HEENT: MM pink/moist, ETT noted at 22, R nare cortrack, pupils 3/reactive anicteric  Neuro: Fully awake, f/c, MAE- generalized weakness CV:  IRIR- slow afib in 50-60's, no murmur PULM: even/non-labored on full support, initially rhonchi with thin white secretions, clear anteriorly after suctioning GI: obese, soft, NT, +bs Extremities: warm/dry, LE wrapped/ boots in place Skin: no rashes, some ecchymosis noted to left picc site/ lower abd   LABS    PULMONARY Recent Labs  Lab 10/09/18 0445 10/10/18 0423 10/12/18 0433  PHART 7.379 7.384 7.368  PCO2ART 44.9 43.4 42.7  PO2ART 67.0* 69.0* 126.0*  HCO3 26.5 25.9 24.6  TCO2 28 27 26   O2SAT 92.0 93.0 99.0    CBC Recent Labs  Lab 10/12/18 0430 10/12/18 0433 10/13/18 0259 10/14/18 0600  HGB 12.2 15.6* 11.1* 11.5*  HCT 38.9 46.0 36.2 39.7  WBC 22.9*  --  20.4* 21.2*  PLT PLATELET CLUMPS NOTED ON SMEAR, COUNT APPEARS DECREASED  --  118* 127*    COAGULATION Recent Labs  Lab 10/13/18 0259  INR 1.2    CARDIAC   Recent Labs  Lab 10/13/18 0259  TROPONINI 0.05*   No results for input(s): PROBNP in the last 168 hours.   CHEMISTRY Recent Labs  Lab 10/10/18 0418  10/11/18 0418 10/12/18 0430 10/12/18 0433 10/12/18 1640 10/13/18 0259 10/13/18 0315 10/13/18 1555 10/14/18 0600  NA 134*   < > 134* 134* 136  133*  --  133* 135 135  K 4.2   < > 4.6 4.6 4.4 4.3  --  4.9 5.8* 5.1  CL 101   < > 100 102  --  102  --  102 105 101  CO2 23   < > 22 21*  --  19*  --  19* 20* 22  GLUCOSE 328*   < > 308* 267*  --  260*  --  308* 163* 186*  BUN 46*   < > 42* 44*  --  46*  --  48* 43* 38*  CREATININE 1.97*   < > 1.65* 1.69*  --  1.76*  --  1.87* 1.64* 1.70*  CALCIUM 8.4*   < > 8.6* 8.9  --  8.3*  --  8.0* 8.7* 8.4*  MG 2.8*  --  2.7* 2.8*  --   --  2.6*  --   --  2.8*  PHOS 2.2*  2.2*   < > 2.8 2.8  --  2.8  --  2.9 2.7 3.1   < > = values in this interval not displayed.  Estimated Creatinine Clearance: 34 mL/min (A) (by C-G formula based on SCr of 1.7 mg/dL (H)).   LIVER Recent Labs  Lab 10/10/18 1043  10/12/18 1640 10/13/18 0259 10/13/18 0315 10/13/18 1555 10/14/18 0600  AST 33  --   --  38  --   --   --   ALT 7  --   --  23  --   --   --   ALKPHOS 120  --   --  88  --   --   --   BILITOT 1.1  --   --  1.6*  --   --   --   PROT 7.6  --   --  7.5  --   --   --   ALBUMIN 2.1*   < > 2.5* 2.4* 2.3* 2.4* 2.5*  INR  --   --   --  1.2  --   --   --    < > = values in this interval not displayed.     INFECTIOUS Recent Labs  Lab 10/13/18 0259 10/13/18 0315  LATICACIDVEN  --  2.0*  PROCALCITON 0.19  --      ENDOCRINE CBG (last 3)  Recent Labs    10/13/18 2341 10/14/18 0330 10/14/18 0726  GLUCAP 153* 160* 168*   IMAGING x48h  Dg Chest Port 1 View  Result Date: 10/13/2018 CLINICAL DATA:  ETT present ,resp failure EXAM: PORTABLE CHEST 1 VIEW COMPARISON:  Chest x-rays dated 10/12/2018, 10/11/2018 and 10/09/2018. FINDINGS: Mild cardiomegaly is stable. Overall cardiomediastinal silhouette is stable. Endotracheal tube is well positioned with tip just above the level of the carina. Enteric tube passes below the diaphragm. RIGHT subclavian central line is stable in position with tip at the level of the mid SVC. Continued central pulmonary vascular congestion and mild bilateral interstitial  edema, unchanged compared to yesterday's exam, improved compared to the earlier study of 10/09/2018. Probable small LEFT pleural effusion. No pneumothorax seen. IMPRESSION: 1. Endotracheal tube well positioned with tip just above the level of the carina. 2. Continued central pulmonary vascular congestion and mild bilateral interstitial edema, unchanged compared to yesterday's exam, improved compared to yesterday's exam. 3. Probable small LEFT pleural effusion. Electronically Signed   By: Franki Cabot M.D.   On: 10/13/2018 08:33   Resolved Hospital Problem list    Assessment & Plan:  ASSESSMENT / PLAN:  PULMONARY A:  Acute resp failure following cardiac arrest Baseline hx - OSA/OHV - non compliant  P:   Full vent support Failed SBT this am.  Will proceed with tracheostomy today with Dr. Tamala Julian Ongoing pulmonary hygiene  NEUROLOGIC A:   Acute Encephalopathy - improving  P:   Seroquel 50mg  BID Clonazepam 1mg  BID Fent gtt- likely can d/c after trach ICU delirium precaution RN intervention  VASCULAR A:   Circulatory shock - ?if she is still dry, pressor requirement improved after fluid bolus yesterday P:  Continue midodrone 10 mg BID Ongoing levophed Continue stress dose steroids   CARDIAC A: PEA arrest : -suspect hypoxia driven Acute on chronic diastolic heart failure --admit with elevated filling pressures, cardiorenal syndrome  Pulmonary hypertension, severe : -s/p RHC, WHO class 2,3 Permanent AF/AFl - -on anticoagulation pre admit NSVT/VT - -off amio due to bradycardia P: Per HF team  Currently heparin gtt held for tracheostomy today   INFECTIOUS A:   MSSA PNA  6/2 Proteus VAP 6/11 P:   Completed cefepime 8 days 6/18, WBC stable/ remains afebrile  RENAL A:  Cardiorenal syndrome AKI/ CKD 3-4, remains anuric P:  CRRT per nephrology   ENDOCRINE A:   Morbid OBestity DM- ongoing hyperglycemia 6/18  P:   Levemir increased to 30 units BID TF coverage 10  units q 4 SSI resistant   HEMATOLOGIC A:  At risk anemia critical illness P:  Trend CBC  GI A:   Tolerting TF Constipation P:   TF currently held Continue PPI  Ongoing scheduled bowel regimen miralax daily,  no reported BM since 6/3 Dulcolax    MSK/DERM RT hip blister - 6/17 P  - skin dressing per RN, no issues per RN  Best practice:  Diet: NPO for trach, will resume TF per cortrak  Pain/Anxiety/Delirium protocol (if indicated):  In place  VAP protocol (if indicated): In place  DVT prophylaxis: Heparin gtt GI prophylaxis: Pepcid Glucose control: SSI Mobility: Bed Code Status: Full Family Communication: daughter-in-law, [name] updated, concerning failed SBT and family is disappointed but we will proceed with trach later today as planned.  Disposition: ICU   CCT 35 mins  Kennieth Rad, MSN, AGACNP-BC [place] [facility] Pgr: [phone] or if no answer [phone] 10/14/2018, 7:59 AM

## 2018-10-14 NOTE — Procedures (Signed)
Bronchoscopy Procedure Note [name] [id number] October 17, 1950  Procedure: Bronchoscopy Indications: Diagnostic evaluation of the airways and tracheostomy placement  Procedure Details Consent: Risks of procedure as well as the alternatives and risks of each were explained to the (patient/caregiver).  Consent for procedure obtained. Time Out: Verified patient identification, verified procedure, site/side was marked, verified correct patient position, special equipment/implants available, medications/allergies/relevent history reviewed, required imaging and test results available.  Performed  In preparation for procedure, patient was given 100% FiO2 and bronchoscope lubricated. Sedation: premedicated with versed/ fentanyl/ etomidate/ rocuronium by Dr. Tamala Julian for tracheostomy   Airway entered and the following bronchi were examined: Bronchi. Procedures performed: none Bronchoscope removed after visualization of percutaneous tracheostomy; bronchoscope then entered via tracheostomy and carina visualized.     Evaluation Hemodynamic Status: BP stable throughout; O2 sats: stable throughout Patient's Current Condition: stable Specimens:  None Complications: No apparent complications Patient did tolerate procedure well.  Procedure performed under direct supervision of Dr. Tamala Julian.    Kennieth Rad, MSN, AGACNP-BC [facility] Pgr: [phone] or if no answer [phone] 10/14/2018, 11:45 AM

## 2018-10-14 NOTE — Progress Notes (Addendum)
[place] for Heparin Indication: atrial fibrillation  Allergies  Allergen Reactions  . Doxycycline     REACTION: Wheals and pruritus    Patient Measurements: Height: 5' (152.4 cm) Weight: 214 lb 8.1 oz (97.3 kg) IBW/kg (Calculated) : 45.5  HEPARIN DW (KG): 71.8  Vital Signs: Temp: 97.1 F (36.2 C) (06/19 0752) Temp Source: Axillary (06/19 0752) BP: 103/55 (06/19 1000) Pulse Rate: 50 (06/19 1000)  Labs: Recent Labs    10/12/18 0430 10/12/18 0433  10/13/18 0259 10/13/18 0315 10/13/18 1555 10/14/18 0600  HGB 12.2 15.6*  --  11.1*  --   --  11.5*  HCT 38.9 46.0  --  36.2  --   --  39.7  PLT PLATELET CLUMPS NOTED ON SMEAR, COUNT APPEARS DECREASED  --   --  118*  --   --  127*  LABPROT  --   --   --  14.9  --   --   --   INR  --   --   --  1.2  --   --   --   HEPARINUNFRC 0.50  --   --  0.44  --   --  <0.10*  CREATININE 1.69*  --    < >  --  1.87* 1.64* 1.70*  TROPONINI  --   --   --  0.05*  --   --   --    < > = values in this interval not displayed.    Estimated Creatinine Clearance: 34 mL/min (A) (by C-G formula based on SCr of 1.7 mg/dL (H)).  Assessment: Kathleen [name] is a 45yoF with a history of afib/aflutter taking Eliquis PTA. Her apixaban was initially continued while inpatient, last dose 5/29, now on hold for potential need for [facility], currently on CRRT. Pharmacy consulted to restart heparin s/p trach placement. Heparin was stopped at midnight this morning in anticipation of trach procedure. Heparin level drawn and subtherapeutic at <0.10.   Patients CBC stable with hgb 11.5, hct 39.7, platelets 127. Patient had successful placement of trach this morning. Pharmacy was asked by MD include a bolus as patient was clotting off her CRRT machine and during the procedure she was clotting as well.   Goal of Therapy:  Heparin level 0.3-0.7 units/ml  Monitor platelets by anticoagulation protocol: Yes   Plan:  Heparin 2000 units  bolus via infusion.  Start heparin infusion at 1100 units/hr.  Heparin level in 8 hours at 2000.  Heparin level and CBC daily while on heparin Monitor for s/sx bleeding Follow plans to transition to oral anticoagulant when extubated   Thank you for the interesting consult and for involving pharmacy in this patient's care.  Tamela Gammon, PharmD 10/14/2018 12:02 PM PGY-1 Pharmacy Resident Direct Phone: [phone] Please check [email or web address] for unit-specific pharmacist phone numbers

## 2018-10-14 NOTE — Progress Notes (Signed)
Filter change

## 2018-10-14 NOTE — Progress Notes (Signed)
Orthopedic Tech Progress Note Patient Details:  Kathleen [name] 1950-08-17 [id number]  Ortho Devices Type of Ortho Device: Haematologist Ortho Device/Splint Location: Bilateral unna boots Ortho Device/Splint Interventions: Application   Post Interventions Patient Tolerated: Well Instructions Provided: Care of device   [name] 10/14/2018, 2:33 PM

## 2018-10-15 LAB — RENAL FUNCTION PANEL
Albumin: 2.2 g/dL — ABNORMAL LOW (ref 3.5–5.0)
Albumin: 2.2 g/dL — ABNORMAL LOW (ref 3.5–5.0)
Albumin: 2 g/dL — ABNORMAL LOW (ref 3.5–5.0)
Anion gap: 11 (ref 5–15)
Anion gap: 10 (ref 5–15)
Anion gap: 8 (ref 5–15)
BUN: 39 mg/dL — ABNORMAL HIGH (ref 8–23)
BUN: 33 mg/dL — ABNORMAL HIGH (ref 8–23)
BUN: 28 mg/dL — ABNORMAL HIGH (ref 8–23)
CO2: 22 mmol/L (ref 22–32)
CO2: 23 mmol/L (ref 22–32)
CO2: 24 mmol/L (ref 22–32)
Calcium: 8.1 mg/dL — ABNORMAL LOW (ref 8.9–10.3)
Calcium: 8.2 mg/dL — ABNORMAL LOW (ref 8.9–10.3)
Calcium: 7.8 mg/dL — ABNORMAL LOW (ref 8.9–10.3)
Chloride: 101 mmol/L (ref 98–111)
Chloride: 101 mmol/L (ref 98–111)
Chloride: 106 mmol/L (ref 98–111)
Creatinine, Ser: 1.42 mg/dL — ABNORMAL HIGH (ref 0.44–1.00)
Creatinine, Ser: 1.35 mg/dL — ABNORMAL HIGH (ref 0.44–1.00)
Creatinine, Ser: 1.23 mg/dL — ABNORMAL HIGH (ref 0.44–1.00)
GFR calc Af Amer: 44 mL/min — ABNORMAL LOW (ref >60)
GFR calc Af Amer: 47 mL/min — ABNORMAL LOW (ref >60)
GFR calc Af Amer: 53 mL/min — ABNORMAL LOW (ref >60)
GFR calc non Af Amer: 38 mL/min — ABNORMAL LOW (ref >60)
GFR calc non Af Amer: 41 mL/min — ABNORMAL LOW (ref >60)
GFR calc non Af Amer: 46 mL/min — ABNORMAL LOW (ref >60)
Glucose, Bld: 272 mg/dL — ABNORMAL HIGH (ref 70–99)
Glucose, Bld: 169 mg/dL — ABNORMAL HIGH (ref 70–99)
Glucose, Bld: 125 mg/dL — ABNORMAL HIGH (ref 70–99)
Phosphorus: 3.3 mg/dL (ref 2.5–4.6)
Phosphorus: 2.6 mg/dL (ref 2.5–4.6)
Phosphorus: 1.9 mg/dL — ABNORMAL LOW (ref 2.5–4.6)
Potassium: 4.5 mmol/L (ref 3.5–5.1)
Potassium: 4.1 mmol/L (ref 3.5–5.1)
Potassium: 4.2 mmol/L (ref 3.5–5.1)
Sodium: 134 mmol/L — ABNORMAL LOW (ref 135–145)
Sodium: 134 mmol/L — ABNORMAL LOW (ref 135–145)
Sodium: 138 mmol/L (ref 135–145)

## 2018-10-15 LAB — CBC
HCT: 32.6 % — ABNORMAL LOW (ref 36.0–46.0)
Hemoglobin: 9.9 g/dL — ABNORMAL LOW (ref 12.0–15.0)
MCH: 27.9 pg (ref 26.0–34.0)
MCHC: 30.4 g/dL (ref 30.0–36.0)
MCV: 91.8 fL (ref 80.0–100.0)
Platelets: 73 10*3/uL — ABNORMAL LOW (ref 150–400)
RBC: 3.55 MIL/uL — ABNORMAL LOW (ref 3.87–5.11)
RDW: 25 % — ABNORMAL HIGH (ref 11.5–15.5)
WBC: 22.1 10*3/uL — ABNORMAL HIGH (ref 4.0–10.5)
nRBC: 2.1 % — ABNORMAL HIGH (ref 0.0–0.2)

## 2018-10-15 LAB — GLUCOSE, CAPILLARY
Glucose-Capillary: 113 mg/dL — ABNORMAL HIGH (ref 70–99)
Glucose-Capillary: 128 mg/dL — ABNORMAL HIGH (ref 70–99)
Glucose-Capillary: 136 mg/dL — ABNORMAL HIGH (ref 70–99)
Glucose-Capillary: 136 mg/dL — ABNORMAL HIGH (ref 70–99)
Glucose-Capillary: 156 mg/dL — ABNORMAL HIGH (ref 70–99)
Glucose-Capillary: 212 mg/dL — ABNORMAL HIGH (ref 70–99)
Glucose-Capillary: 301 mg/dL — ABNORMAL HIGH (ref 70–99)

## 2018-10-15 LAB — TROPONIN I
Troponin I: 0.04 ng/mL (ref <0.03)
Troponin I: 0.04 ng/mL (ref <0.03)
Troponin I: 0.04 ng/mL (ref <0.03)

## 2018-10-15 LAB — HEPARIN LEVEL (UNFRACTIONATED): Heparin Unfractionated: 0.47 IU/mL (ref 0.30–0.70)

## 2018-10-15 LAB — MAGNESIUM: Magnesium: 2.6 mg/dL — ABNORMAL HIGH (ref 1.7–2.4)

## 2018-10-15 MED ORDER — ASPIRIN 81 MG PO CHEW
81.0000 mg | CHEWABLE_TABLET | Freq: Every day | ORAL | Status: DC
  Administered 2018-10-15 – 2018-11-06 (×23): 81 mg
  Filled 2018-10-15 (×26): qty 1

## 2018-10-15 MED ORDER — K PHOS MONO-SOD PHOS DI & MONO 155-852-130 MG PO TABS
125.0000 mg | ORAL_TABLET | Freq: Two times a day (BID) | ORAL | Status: AC
  Administered 2018-10-15 – 2018-10-17 (×4): 125 mg
  Filled 2018-10-15 (×4): qty 0.5

## 2018-10-15 NOTE — Progress Notes (Addendum)
[facility]    NEPHROLOGY PROGRESS NOTE  SUBJECTIVE: Patient seen and examined.  Received tracheostomy at the bedside.  In good spirits today.  Denies chest pain, shortness of breath, nausea, vomiting, diarrhea or dysuria..  Chart reviewed extensively.  No acute events noted.  Discussed with the nurse at bedside.  Remains on a low-dose of Levophed.  Net UF on CRRT has been 0 for several days.  CVP this morning was 4.  OBJECTIVE:  Vitals:   10/15/18 0900 10/15/18 0955  BP: (!) 101/37 (!) 101/37  Pulse: (!) 51 (!) 46  Resp: (!) 22 (!) 22  Temp:    SpO2: 100% 98%    Intake/Output Summary (Last 24 hours) at 10/15/2018 1055 Last data filed at 10/15/2018 0800 Gross per 24 hour  Intake 1105.21 ml  Output 782 ml  Net 323.21 ml      General: Sedated NAD HEENT: MMM [place] AT anicteric sclera Neck:  No JVD, no adenopathy CV:  Heart RRR  Lungs:  L/S CTA bilaterally Abd:  abd SNT/ND with normal BS GU:  Bladder non-palpable Extremities: Trace bilateral LE edema. Skin:  No skin rash  MEDICATIONS:  . aspirin  81 mg Per Tube Daily  . atorvastatin  40 mg Oral q1800  . chlorhexidine gluconate (MEDLINE KIT)  15 mL Mouth Rinse BID  . Chlorhexidine Gluconate Cloth  6 each Topical Daily  . clonazePAM  1 mg Per Tube BID  . feeding supplement (PRO-STAT SUGAR FREE 64)  30 mL Per Tube QID  . feeding supplement (VITAL 1.5 CAL)  1,000 mL Per Tube Q24H  . hydrocortisone sodium succinate  50 mg Intravenous Q6H  . insulin aspart  0-20 Units Subcutaneous Q4H  . insulin aspart  10 Units Subcutaneous Q4H  . insulin detemir  30 Units Subcutaneous BID  . mouth rinse  15 mL Mouth Rinse 10 times per day  . midodrine  10 mg Oral TID WC  . polyethylene glycol  17 g Oral Daily  . QUEtiapine  50 mg Oral BID  . sodium chloride flush  10-40 mL Intracatheter Q12H       LABS:   CBC Latest Ref Rng & Units 10/15/2018 10/14/2018 10/13/2018  WBC 4.0 - 10.5 K/uL 22.1(H) 21.2(H) 20.4(H)  Hemoglobin  12.0 - 15.0 g/dL 9.9(L) 11.5(L) 11.1(L)  Hematocrit 36.0 - 46.0 % 32.6(L) 39.7 36.2  Platelets 150 - 400 K/uL 73(L) 127(L) 118(L)    CMP Latest Ref Rng & Units 10/15/2018 10/15/2018 10/14/2018  Glucose 70 - 99 mg/dL 169(H) 272(H) 166(H)  BUN 8 - 23 mg/dL 33(H) 39(H) 38(H)  Creatinine 0.44 - 1.00 mg/dL 1.35(H) 1.42(H) 1.61(H)  Sodium 135 - 145 mmol/L 134(L) 134(L) 136  Potassium 3.5 - 5.1 mmol/L 4.1 4.5 4.1  Chloride 98 - 111 mmol/L 101 101 102  CO2 22 - 32 mmol/L 23 22 23   Calcium 8.9 - 10.3 mg/dL 8.2(L) 8.1(L) 7.8(L)  Total Protein 6.5 - 8.1 g/dL - - -  Total Bilirubin 0.3 - 1.2 mg/dL - - -  Alkaline Phos 38 - 126 U/L - - -  AST 15 - 41 U/L - - -  ALT 0 - 44 U/L - - -    Lab Results  Component Value Date   PTH 33 10/12/2018   CALCIUM 8.2 (L) 10/15/2018   CAION 1.18 10/12/2018   PHOS 2.6 10/15/2018       Component Value Date/Time   COLORURINE AMBER (A) 05/22/2013 1826   APPEARANCEUR HAZY (A) 05/22/2013  1826   LABSPEC 1.024 05/22/2013 1826   PHURINE 6.0 05/22/2013 1826   GLUCOSEU NEGATIVE 05/22/2013 1826   HGBUR NEGATIVE 05/22/2013 1826   BILIRUBINUR SMALL (A) 05/22/2013 1826   KETONESUR NEGATIVE 05/22/2013 1826   PROTEINUR 100 (A) 05/22/2013 1826   UROBILINOGEN 4.0 (H) 05/22/2013 1826   NITRITE NEGATIVE 05/22/2013 1826   LEUKOCYTESUR NEGATIVE 05/22/2013 1826      Component Value Date/Time   PHART 7.368 10/12/2018 0433   PCO2ART 42.7 10/12/2018 0433   PO2ART 126.0 (H) 10/12/2018 0433   HCO3 24.6 10/12/2018 0433   TCO2 26 10/12/2018 0433   ACIDBASEDEF 1.0 10/12/2018 0433   O2SAT 99.0 10/12/2018 0433       Component Value Date/Time   IRON 44 10/12/2018 1640   TIBC 379 10/12/2018 1640   IRONPCTSAT 12 10/12/2018 1640       ASSESSMENT/PLAN:    68 year old female patient with chronic diastolic heart failure who failed outpatient diuretic therapy underwent a right heart catheterization on 5/29 showing markedly elevated biventricular filling pressures and had a  subsequent PEA arrest on 5/30.  She was intubated and ROSC was achieved.  She was started on CRRT on 5/31 for acute kidney injury.  She has had an ongoing pressor requirement.  1.  Chronic kidney disease stage III with a baseline serum creatinine around 1.7-1.9.  I suspect her chronic kidney disease is on the basis of longstanding hypertension and diabetes.  Intact PTH 33 on 10/12/2018.  2.  Acute kidney injury.  Likely ATN in the setting of shock.  Has had no recovery of renal function yet.  Remains on CRRT.  Chest x-ray consistent with pulmonary congestion.  Continues on CRRT due to pressor requirement.   She has lost about 39 kg to date from CRRT.  Phosphorus stable on CRRT.  Will hold ultrafiltration on CRRT and allow her to become positive.  3.  Chronic respiratory failure.  Status post tracheostomy today.  6.  Chronic diastolic heart failure.  Appears relatively euvolemic.     [place], DO, [place]

## 2018-10-15 NOTE — Progress Notes (Signed)
[place] Progress Note Patient Name: Kathleen [name] DOB: Nov 08, 1950 MRN: [id number]   Date of Service  10/15/2018  HPI/Events of Note  Request to review EKG - EKG reveals Undetermined rhythm. Left axis deviation. Anterior infarct , age undetermined.  eICU Interventions  Will cycle Troponin.      Intervention Category Intermediate Interventions: Diagnostic test evaluation  Darika Ildefonso Cornelia Copa 10/15/2018, 12:49 AM

## 2018-10-15 NOTE — Progress Notes (Signed)
NAME:  Kathleen [name], MRN:  [id number], DOB:  [birth year], LOS: 23 ADMISSION DATE:  09/22/2018, CONSULTATION DATE: 09/24/2018 REFERRING MD: Candee Furbish MD, CHIEF COMPLAINT: Cardiac arrest  Brief History   68 y/o obese woman with chronic diastolic heart failure who failed outpatient diuretics management due to chronic kidney disease.  Right heart cath on 5/29 showed markedly elevated biventricular filling pressures with normal cardiac output.  She was on cardiology service, maintained on milrinone and Lasix.  On the morning of 5/30 she went into PEA arrest, CPR performed for 8 minutes.  Intubated and transferred to ICU.  Started on CRRT 5/31.  Extubated 6/3.  Re-intubated early am 6/11 for hypercarbic respiratory failure  Past Medical History   has a past medical history of Atrial flutter ([place]), Bradycardia, Chronic diastolic (congestive) heart failure ([place]) (01/10/2015), CKD (chronic kidney disease), stage IV ([place]), Degenerative joint disease of hand, Diabetes mellitus, Dyslipidemia, Fecal occult blood test positive, GERD (gastroesophageal reflux disease), Headache, Hypertension, Inadequate material resources, Irritable bowel syndrome, Morbid obesity ([place]), Obesity hypoventilation syndrome ([place]), Post-menopausal bleeding, and Shortness of breath dyspnea.  Significant Hospital Events   5/28 Admit 5/29 RHC 5/30 PEA arrest, intubated/ CRRT 5/31 Milrinone stopped due to ectopy; brady episode- amio stopped 6/02 Attempt at SBT, chest x-ray with worsening pulmonary edema, CVVHD for volume removal 6/03 Extubated > remain on CRRT.  Refused nocturnal BiPAP 6/04 Refused nocturnal BiPAP 6/10 off CRRT 6/11 Intubated early am with hypercarbia, concern for RUL PNA; abx broadened; restarted on CRRT 6/16  No events overnight, weaning on high PS 6/17  failed vent wean, remains on levophed, midodrine and CRRT 6/18  Weaned for 5 hours on PSV 8/5 before requiring full support; ongoing CRRT, remains on levophed 14  mcg 6/19 - TRACH + Dr Erskine Emery   Consults:  Cardiology PCCM  Nephrology   Procedures:  Lt PICC 5/29 >> OETT 5/30 >> 6/3; 6/11 > 6/19 - TRACH (Dr Erskine Emery) >> R IJ HD cath 5/30 >> Aline left ulnar 5/30 >> 6/2  Significant Diagnostic Tests:  TTE 5/28 >>The left ventricle has normal systolic function with an ejection fraction of 60-65%. The cavity size was normal. Left ventricular diastolic doppler parameters are indeterminate. No evidence of left ventricular regional wall motion abnormalities. The right ventricle has normal systolic function. The cavity was mildly enlarged. There is no increase in right ventricular wall thickness. Right atrial size was mildly dilated. The aortic valve is tricuspid. Mild thickening of the aortic valve. Mild calcification of the aortic valve. Aortic valve regurgitation is trivial by color flow Doppler.The aortic root is normal in size and structure.  Right heart cath 5/29 RA = 24 RV = 94/26 PA = 95/36 (53) PCW = 30 (v = 50) Fick cardiac output/index = 6.0/2.7 PVR =3.9 WU FA sat = 91% PA sat = 58%, 58% SVC 60%  CXR 6/15> Bilateral ASD LLL > R. ETT, GT, LIJ and RIJ CVCs stable position.   Micro Data:  SARS coronavirus 2 cepheid 5/28 >> neg MRSA PCR 5/28 >> neg Trach aspirate 6/2 >> MSSA BCx2 6/2 >> negative ...................... Tracheal aspirate 6/11 >> few GNR >> few proteus and few candida tropicalis BCx2 6/11 >> negative  Antimicrobials:  Vancomycin 6/3 > 6/4 Cefazolin 6/4 >> 6/10 ...........................Marland Kitchen Vanco 6/11 >> 6/17 Cefepime 6/11 >>6/18  Interim history/subjective:    6/20 - s/p trach yesterday. On IV heparin gt, levophed gtt with midodrine. Off fent gtt. Pressopr needs down  Objective   Blood  pressure (!) 101/37, pulse (!) 46, temperature 97.8 F (36.6 C), temperature source Oral, resp. rate (!) 22, height 5' (1.524 m), weight 98.2 kg, SpO2 98 %. CVP:  [3 mmHg-10 mmHg] 3 mmHg  Vent Mode: PRVC FiO2 (%):  [40 %-60  %] 40 % Set Rate:  [18 bmp] 18 bmp Vt Set:  [420 mL] 420 mL PEEP:  [5 cmH20] 5 cmH20 Plateau Pressure:  [19 cmH20-24 cmH20] 19 cmH20   Intake/Output Summary (Last 24 hours) at 10/15/2018 1044 Last data filed at 10/15/2018 0800 Gross per 24 hour  Intake 1105.21 ml  Output 782 ml  Net 323.21 ml   Filed Weights   10/13/18 0500 10/14/18 0500 10/15/18 0200  Weight: 99.3 kg 97.3 kg 98.2 kg    General Appearance:  Looks chronic unwell but much better. Smiling Head:  Normocephalic, without obvious abnormality, atraumatic Eyes:  PERRL - yes, conjunctiva/corneas - clear     Ears:  Normal external ear canals, both ears Nose:  G tube - yes Throat:  ETT TUBE - no , OG tube - no. TRACH +.  Neck:  Supple,  No enlargement/tenderness/nodules Lungs: Clear to auscultation bilaterally, Ventilator   Synchrony - yes Heart:  S1 and S2 normal, no murmur, CVP - no.  Pressors - low dose leveophed Abdomen:  Soft, no masses, no organomegaly Genitalia / Rectal:  Not done Extremities:  Extremities- intact Skin:  ntact in exposed areas . Sacral area - not examned Neurologic:  Sedation - none -> RASS - +1 . Moves all 4s - yes. CAM-ICU - neg . Orientation - full oriented      LABS    PULMONARY Recent Labs  Lab 10/09/18 0445 10/10/18 0423 10/12/18 0433  PHART 7.379 7.384 7.368  PCO2ART 44.9 43.4 42.7  PO2ART 67.0* 69.0* 126.0*  HCO3 26.5 25.9 24.6  TCO2 28 27 26   O2SAT 92.0 93.0 99.0    CBC Recent Labs  Lab 10/13/18 0259 10/14/18 0600 10/15/18 0107  HGB 11.1* 11.5* 9.9*  HCT 36.2 39.7 32.6*  WBC 20.4* 21.2* 22.1*  PLT 118* 127* 73*    COAGULATION Recent Labs  Lab 10/13/18 0259  INR 1.2    CARDIAC   Recent Labs  Lab 10/13/18 0259 10/15/18 0053 10/15/18 0632  TROPONINI 0.05* 0.04* 0.04*   No results for input(s): PROBNP in the last 168 hours.   CHEMISTRY Recent Labs  Lab 10/11/18 0418 10/12/18 0430  10/13/18 0259  10/13/18 1555 10/14/18 0600 10/14/18 1611  10/15/18 0107 10/15/18 0632  NA 134* 134*   < >  --    < > 135 135 136 134* 134*  K 4.6 4.6   < >  --    < > 5.8* 5.1 4.1 4.5 4.1  CL 100 102   < >  --    < > 105 101 102 101 101  CO2 22 21*   < >  --    < > 20* 22 23 22 23   GLUCOSE 308* 267*   < >  --    < > 163* 186* 166* 272* 169*  BUN 42* 44*   < >  --    < > 43* 38* 38* 39* 33*  CREATININE 1.65* 1.69*   < >  --    < > 1.64* 1.70* 1.61* 1.42* 1.35*  CALCIUM 8.6* 8.9   < >  --    < > 8.7* 8.4* 7.8* 8.1* 8.2*  MG 2.7* 2.8*  --  2.6*  --   --  2.8*  --   --  2.6*  PHOS 2.8 2.8   < >  --    < > 2.7 3.1 4.9* 3.3 2.6   < > = values in this interval not displayed.   Estimated Creatinine Clearance: 43.1 mL/min (A) (by C-G formula based on SCr of 1.35 mg/dL (H)).   LIVER Recent Labs  Lab 10/10/18 1043  10/13/18 0259  10/13/18 1555 10/14/18 0600 10/14/18 1611 10/15/18 0107 10/15/18 0632  AST 33  --  38  --   --   --   --   --   --   ALT 7  --  23  --   --   --   --   --   --   ALKPHOS 120  --  88  --   --   --   --   --   --   BILITOT 1.1  --  1.6*  --   --   --   --   --   --   PROT 7.6  --  7.5  --   --   --   --   --   --   ALBUMIN 2.1*   < > 2.4*   < > 2.4* 2.5* 2.2* 2.2* 2.2*  INR  --   --  1.2  --   --   --   --   --   --    < > = values in this interval not displayed.     INFECTIOUS Recent Labs  Lab 10/13/18 0259 10/13/18 0315 10/14/18 0600  LATICACIDVEN  --  2.0*  --   PROCALCITON 0.19  --  0.21     ENDOCRINE CBG (last 3)  Recent Labs    10/14/18 2356 10/15/18 0359 10/15/18 0837  GLUCAP 301* 212* 156*   IMAGING x48h  Dg Chest Port 1 View  Result Date: 10/14/2018 CLINICAL DATA:  67 year old female with tracheostomy tube placement EXAM: PORTABLE CHEST 1 VIEW COMPARISON:  10/13/2018, 10/12/2018, 10/11/2018 FINDINGS: Cardiomediastinal silhouette unchanged in size and contour. Low lung volumes accentuate the interstitium and the vasculature. Interval removal of endotracheal tube in placement of tracheostomy  which terminates at the level of the clavicular heads in the midline. Unchanged position of left upper extremity PICC with the catheter terminating in the superior vena cava. Unchanged right IJ central venous catheter with the tip appearing to terminate at the superior cavoatrial junction. Enteric feeding tube terminates out of the field of view. Reticular opacities at the lung bases with no interlobular septal thickening. No pneumothorax. IMPRESSION: Interval placement of tracheostomy tube and removal of endotracheal tube. Low lung volumes with likely atelectasis/consolidation. Unchanged right IJ hemodialysis catheter, left upper extremity PICC, and enteric feeding tube. Electronically Signed   By: Corrie Mckusick D.O.   On: 10/14/2018 12:37   Resolved Hospital Problem list    Assessment & Plan:  ASSESSMENT / PLAN:  PULMONARY A:  Baseline hx - OSA/OHV - non compliant  Acute resp failure following cardiac arrest- prolonged mech vent and s/p trach 10/14/2018  6/20 - doing well on trach  P:   Full vent support Ongoing pulmonary hygiene  NEUROLOGIC A:   Acute Encephalopathy - resolved 6/19.   6.20 - off fent gtt P:   Seroquel 50mg  BID Clonazepam 1mg  BID ICU delirium precaution RN intervention  VASCULAR A:   Circulatory shock - ?if she is still dry, pressor requirement improved after fluid bolus yesterday  6/20 -  improving post trch  P:  Continue midodrone 10 mg BID Ongoing levophed Continue stress dose steroids   CARDIAC A: PEA arrest : -suspect hypoxia driven Acute on chronic diastolic heart failure --admit with elevated filling pressures, cardiorenal syndrome  Pulmonary hypertension, severe : -s/p RHC, WHO class 2,3 Permanent AF/AFl - -on anticoagulation pre admit NSVT/VT - -off amio due to bradycardia P: Per HF team  Heparin gtt  INFECTIOUS A:   MSSA PNA  6/2 Proteus VAP 6/11 P:   Completed cefepime 8 days 6/18, WBC stable/ remains afebrile  RENAL A:   Cardiorenal syndrome AKI/ CKD 3-4, remains anuric P:  CRRT per nephrology   ENDOCRINE A:   Morbid OBestity DM- ongoing hyperglycemia 6/18  P:   Levemir increased to 30 units BID TF coverage 10 units q 4 SSI resistant   HEMATOLOGIC A:  At risk anemia critical illness P:  - PRBC for hgb </= 6.9gm%    - exceptions are   -  if ACS susepcted/confirmed then transfuse for hgb </= 8.0gm%,  or    -  active bleeding with hemodynamic instability, then transfuse regardless of hemoglobin value   At at all times try to transfuse 1 unit prbc as possible with exception of active hemorrhage    GI A:   Tolerting TF Constipation P:   TF currently held Continue PPI  Ongoing scheduled bowel regimen miralax daily,  no reported BM since 6/3 Dulcolax    MSK/DERM RT hip blister - 6/17 P  - skin dressing per RN, no issues per RN  Best practice:  Diet: NPO for trach, will resume TF per cortrak  Pain/Anxiety/Delirium protocol (if indicated):  In place  VAP protocol (if indicated): In place  DVT prophylaxis: Heparin gtt GI prophylaxis: Pepcid Glucose control: SSI Mobility: Bed Code Status: Full Family Communication:patient bedside 10/15/2018  Disposition: ICU   ATTESTATION & SIGNATURE   The patient Kathleen PFAHLER is critically ill with multiple organ systems failure and requires high complexity decision making for assessment and support, frequent evaluation and titration of therapies, application of advanced monitoring technologies and extensive interpretation of multiple databases.   Critical Care Time devoted to patient care services described in this note is  30  Minutes. This time reflects time of care of this signee Dr Brand Males. This critical care time does not reflect procedure time, or teaching time or supervisory time of PA/NP/Med student/Med Resident etc but could involve care discussion time     Dr. Brand Males, M.D., [facility].C.P Pulmonary and Critical Care  Medicine Staff Physician [place] [facility] Pager: [phone], If no answer or between  15:00h - 7:00h: call [phone]  10/15/2018 10:44 AM

## 2018-10-15 NOTE — Progress Notes (Signed)
[place] for Heparin Indication: atrial fibrillation  Allergies  Allergen Reactions  . Doxycycline     REACTION: Wheals and pruritus    Patient Measurements: Height: 5' (152.4 cm) Weight: 216 lb 7.9 oz (98.2 kg) IBW/kg (Calculated) : 45.5  HEPARIN DW (KG): 71.8  Vital Signs: Temp: 98.1 F (36.7 C) (06/20 0400) Temp Source: Oral (06/20 0400) BP: 93/51 (06/20 0700) Pulse Rate: 47 (06/20 0700)  Labs: Recent Labs    10/13/18 0259  10/14/18 0600 10/14/18 1611 10/14/18 1913 10/15/18 0053 10/15/18 0107 10/15/18 0459 10/15/18 0632  HGB 11.1*  --  11.5*  --   --   --  9.9*  --   --   HCT 36.2  --  39.7  --   --   --  32.6*  --   --   PLT 118*  --  127*  --   --   --  73*  --   --   LABPROT 14.9  --   --   --   --   --   --   --   --   INR 1.2  --   --   --   --   --   --   --   --   HEPARINUNFRC 0.44  --  <0.10*  --  0.34  --   --  0.47  --   CREATININE  --    < > 1.70* 1.61*  --   --  1.42*  --  1.35*  TROPONINI 0.05*  --   --   --   --  0.04*  --   --  0.04*   < > = values in this interval not displayed.    Estimated Creatinine Clearance: 43.1 mL/min (A) (by C-G formula based on SCr of 1.35 mg/dL (H)).  Assessment: Kathleen [name] is a 46yoF with a history of afib/aflutter taking Eliquis PTA. Her apixaban was initially continued while inpatient, last dose 5/29, now on hold for potential need for [facility], currently on CRRT.  Heparin level remains therapeutic at 0.47 on heparin 1100 units/hr. Hgb/plt stable. No signs/symptoms of bleeding or issues with infusion reported by nurse.    Goal of Therapy:  Heparin level 0.3-0.7 units/ml  Monitor platelets by anticoagulation protocol: Yes   Plan:  Continue heparin at 1100 units / hr Daily HL and CBC while on heparin   Claiborne Billings, PharmD PGY2 Cardiology Pharmacy Resident Please check AMION for all Pharmacist numbers by unit 10/15/2018 8:35 AM

## 2018-10-15 NOTE — Progress Notes (Signed)
Wasted 100 cc fentanyl in sink with Virgie Dad RN witness. Valinda Party

## 2018-10-15 NOTE — Progress Notes (Signed)
Assisted tele visit to patient with family member.  Malini Flemings M, RN  

## 2018-10-15 NOTE — Progress Notes (Signed)
Assisted tele visit to patient with daughter.  Arli Bree M, RN  

## 2018-10-15 NOTE — Progress Notes (Signed)
[facility] Progress Note Patient Name: Kathleen [name] DOB: [birth year] MRN: [id number]   Date of Service  10/15/2018  HPI/Events of Note  Troponin #1 = 0.04.   eICU Interventions  Will order: 1. ASA 81 mg per tube now and Q day. 2. Continue to trend Troponin.     Intervention Category Major Interventions: Other:  [name] 10/15/2018, 3:45 AM

## 2018-10-15 NOTE — Progress Notes (Signed)
Patient ID: Kathleen [name], female   DOB: February 06, 1951, 68 y.o.   MRN: [id number]    Advanced Heart Failure Rounding Note   Subjective:     Underwent bedside trach yesterday. Wide awake on vent through trach.   CVVHD running even. NE down to 3.    Objective:   Weight Range:  Vital Signs:   Temp:  [94.7 F (34.8 C)-98.1 F (36.7 C)] 97.8 F (36.6 C) (06/20 0841) Pulse Rate:  [40-55] 51 (06/20 0900) Resp:  [7-24] 22 (06/20 0900) BP: (83-140)/(36-90) 101/37 (06/20 0900) SpO2:  [95 %-100 %] 100 % (06/20 0900) FiO2 (%):  [40 %-60 %] 40 % (06/20 0443) Weight:  [98.2 kg] 98.2 kg (06/20 0200) Last BM Date: 10/03/18  Weight change: Filed Weights   10/13/18 0500 10/14/18 0500 10/15/18 0200  Weight: 99.3 kg 97.3 kg 98.2 kg    Intake/Output:   Intake/Output Summary (Last 24 hours) at 10/15/2018 0952 Last data filed at 10/15/2018 0800 Gross per 24 hour  Intake 1116.45 ml  Output 799 ml  Net 317.45 ml     Physical Exam: General:  Sitting up in bed.  HEENT: normal Neck: supple. + trach no JVD. Carotids 2+ bilat; no bruits. No lymphadenopathy or thryomegaly appreciated. Cor: PMI nondisplaced. Irregular rate & rhythm. No rubs, gallops or murmurs. +PC Lungs: clear Abdomen: obese soft, nontender, nondistended. No hepatosplenomegaly. No bruits or masses. Good bowel sounds. Extremities: no cyanosis, clubbing, rash, tr edema + boots Neuro: alert & orientedx3, cranial nerves grossly intact. moves all 4 extremities w/o difficulty. Affect pleasant    Telemetry: AF 50-60s. Personally reviewed   Labs: Basic Metabolic Panel: Recent Labs  Lab 10/11/18 0418 10/12/18 0430  10/13/18 0259  10/13/18 1555 10/14/18 0600 10/14/18 1611 10/15/18 0107 10/15/18 0632  NA 134* 134*   < >  --    < > 135 135 136 134* 134*  K 4.6 4.6   < >  --    < > 5.8* 5.1 4.1 4.5 4.1  CL 100 102   < >  --    < > 105 101 102 101 101  CO2 22 21*   < >  --    < > 20* _0 GLUCOSE 308* 267*   < >  --     < > 163* 186* 166* 272* 169*  BUN 42* 44*   < >  --    < > 43* 38* 38* 39* 33*  CREATININE 1.65* 1.69*   < >  --    < > 1.64* 1.70* 1.61* 1.42* 1.35*  CALCIUM 8.6* 8.9   < >  --    < > 8.7* 8.4* 7.8* 8.1* 8.2*  MG 2.7* 2.8*  --  2.6*  --   --  2.8*  --   --  2.6*  PHOS 2.8 2.8   < >  --    < > 2.7 3.1 4.9* 3.3 2.6   < > = values in this interval not displayed.    Liver Function Tests: Recent Labs  Lab 10/10/18 1043  10/13/18 0259  10/13/18 1555 10/14/18 0600 10/14/18 1611 10/15/18 0107 10/15/18 0632  AST 33  --  38  --   --   --   --   --   --   ALT 7  --  23  --   --   --   --   --   --   ALKPHOS 120  --  88  --   --   --   --   --   --   BILITOT 1.1  --  1.6*  --   --   --   --   --   --   PROT 7.6  --  7.5  --   --   --   --   --   --   ALBUMIN 2.1*   < > 2.4*   < > 2.4* 2.5* 2.2* 2.2* 2.2*   < > = values in this interval not displayed.   No results for input(s): LIPASE, AMYLASE in the last 168 hours. Recent Labs  Lab 10/10/18 1043  AMMONIA 39*    CBC: Recent Labs  Lab 10/11/18 0418 10/12/18 0430 10/12/18 0433 10/13/18 0259 10/14/18 0600 10/15/18 0107  WBC 17.6* 22.9*  --  20.4* 21.2* 22.1*  HGB 11.5* 12.2 15.6* 11.1* 11.5* 9.9*  HCT 38.2 38.9 46.0 36.2 39.7 32.6*  MCV 87.2 88.4  --  91.4 89.6 91.8  PLT 170 PLATELET CLUMPS NOTED ON SMEAR, COUNT APPEARS DECREASED  --  118* 127* 73*    Cardiac Enzymes: Recent Labs  Lab 10/13/18 0259 10/15/18 0053 10/15/18 0632  TROPONINI 0.05* 0.04* 0.04*    BNP: BNP (last 3 results) Recent Labs    09/22/18 1420  BNP 451.2*    ProBNP (last 3 results) No results for input(s): PROBNP in the last 8760 hours.    Other results:  Imaging: Dg Chest Port 1 View  Result Date: 10/14/2018 CLINICAL DATA:  68 year old female with tracheostomy tube placement EXAM: PORTABLE CHEST 1 VIEW COMPARISON:  10/13/2018, 10/12/2018, 10/11/2018 FINDINGS: Cardiomediastinal silhouette unchanged in size and contour. Low lung  volumes accentuate the interstitium and the vasculature. Interval removal of endotracheal tube in placement of tracheostomy which terminates at the level of the clavicular heads in the midline. Unchanged position of left upper extremity PICC with the catheter terminating in the superior vena cava. Unchanged right IJ central venous catheter with the tip appearing to terminate at the superior cavoatrial junction. Enteric feeding tube terminates out of the field of view. Reticular opacities at the lung bases with no interlobular septal thickening. No pneumothorax. IMPRESSION: Interval placement of tracheostomy tube and removal of endotracheal tube. Low lung volumes with likely atelectasis/consolidation. Unchanged right IJ hemodialysis catheter, left upper extremity PICC, and enteric feeding tube. Electronically Signed   By: Corrie Mckusick D.O.   On: 10/14/2018 12:37     Medications:     Scheduled Medications: . aspirin  81 mg Per Tube Daily  . atorvastatin  40 mg Oral q1800  . chlorhexidine gluconate (MEDLINE KIT)  15 mL Mouth Rinse BID  . Chlorhexidine Gluconate Cloth  6 each Topical Daily  . clonazePAM  1 mg Per Tube BID  . feeding supplement (PRO-STAT SUGAR FREE 64)  30 mL Per Tube QID  . feeding supplement (VITAL 1.5 CAL)  1,000 mL Per Tube Q24H  . hydrocortisone sodium succinate  50 mg Intravenous Q6H  . insulin aspart  0-20 Units Subcutaneous Q4H  . insulin aspart  10 Units Subcutaneous Q4H  . insulin detemir  30 Units Subcutaneous BID  . mouth rinse  15 mL Mouth Rinse 10 times per day  . midodrine  10 mg Oral TID WC  . polyethylene glycol  17 g Oral Daily  . QUEtiapine  50 mg Oral BID  . sodium chloride flush  10-40 mL Intracatheter Q12H    Infusions: .  prismasol BGK  4/2.5 500 mL/hr at 10/14/18 2333  .  prismasol BGK 4/2.5 300 mL/hr at 10/15/18 0204  . sodium chloride    . sodium chloride Stopped (10/11/18 1907)  . famotidine (PEPCID) IV Stopped (10/14/18 1548)  . fentaNYL  infusion INTRAVENOUS Stopped (10/14/18 2336)  . heparin 1,100 Units/hr (10/15/18 0700)  . norepinephrine (LEVOPHED) Adult infusion 3 mcg/min (10/15/18 0700)  . prismasol BGK 4/2.5 1,500 mL/hr at 10/15/18 0707  . sodium chloride 999 mL/hr at 10/06/18 1018    PRN Medications: Place/Maintain arterial line **AND** sodium chloride, sodium chloride, acetaminophen (TYLENOL) oral liquid 160 mg/5 mL, bisacodyl, docusate, heparin, midazolam, ondansetron, rocuronium, sodium chloride, sodium chloride flush   Assessment/Plan:   1. PEA arrest on 5/30 - due to hypoxia/pulmonary edema. - now stable  2. Acute on chronic hypoxic respiratory failure - has underlying  OHS/OSA  - intubated on 5/30 in setting of arrest. extubated 6/3.reintubated 6/11 with worsening RUL aispace disease.  - treated for MSSA PNA with cefipime/vanc.  - s/p trach 6/19 - Management per CCM   3.  Acute on chronic diastolic HF - Admitted with markedly elevated filling pressures complicated by severe cardiorenal syndrome - Now down 87 pounds with CVVHD. - Volume management per Renal and CVVHD. Now appears euvolemic - ? Need to w/u for possible TTR amyloid  4. Pulmonary HTN, severe  - this is mostly pulmonary venous HTN by cath [facility] Group 2) but also has a component of OHS/OSA (WHO group 3). Improved with volume removal.  - no role for selective pulmonary vasodilators  5. AKI on CKD 3 - baseline creatinine 1.9 -> ESRD - Remains on CVVHD. Renal managing - Hopefully we can get on iHD once off NE  6. Morbid obesity - needs weight loss  6. Chronic AF/AFL - Off apixaban. Now on heparin. No bleeding. Discussed dosing with PharmD personally. - Rhythm is slow AF today - No role for PPM currently  7. VT/NSVT - Quiescent.  off amio due to bradycardia - Keep K> 4.0 Mg 2.0  8. MSSA PNA - Bcx drawn 6/2 & 6/11. NGTD - sputum cx with MSSA PNA - CXR with worsening RUL PNA. Switched to cefipime/vanc  9. Hypotension - ?   Due to sepsis.  - Now on midodrine 10 tid and NE 3 - Continue to wean NE as tolerated  CRITICAL CARE Performed by: Glori Bickers  Total critical care time: 35 minutes  Critical care time was exclusive of separately billable procedures and treating other patients.  Critical care was necessary to treat or prevent imminent or life-threatening deterioration.  Critical care was time spent personally by me (independent of midlevel providers or residents) on the following activities: development of treatment plan with patient and/or surrogate as well as nursing, discussions with consultants, evaluation of patient's response to treatment, examination of patient, obtaining history from patient or surrogate, ordering and performing treatments and interventions, ordering and review of laboratory studies, ordering and review of radiographic studies, pulse oximetry and re-evaluation of patient's condition.     Length of Stay: 23  Glori Bickers MD 10/15/2018, 9:52 AM  Advanced Heart Failure Team Pager [phone] (M-F; 7a - 4p)  Please contact [facility] for night-coverage after hours (4p -7a ) and weekends on [email or web address]

## 2018-10-16 ENCOUNTER — Inpatient Hospital Stay (HOSPITAL_COMMUNITY): Payer: Medicare HMO

## 2018-10-16 LAB — RENAL FUNCTION PANEL
Albumin: 2.1 g/dL — ABNORMAL LOW (ref 3.5–5.0)
Albumin: 2 g/dL — ABNORMAL LOW (ref 3.5–5.0)
Anion gap: 8 (ref 5–15)
Anion gap: 8 (ref 5–15)
BUN: 27 mg/dL — ABNORMAL HIGH (ref 8–23)
BUN: 28 mg/dL — ABNORMAL HIGH (ref 8–23)
CO2: 24 mmol/L (ref 22–32)
CO2: 25 mmol/L (ref 22–32)
Calcium: 7.8 mg/dL — ABNORMAL LOW (ref 8.9–10.3)
Calcium: 8.2 mg/dL — ABNORMAL LOW (ref 8.9–10.3)
Chloride: 103 mmol/L (ref 98–111)
Chloride: 101 mmol/L (ref 98–111)
Creatinine, Ser: 1.23 mg/dL — ABNORMAL HIGH (ref 0.44–1.00)
Creatinine, Ser: 1.11 mg/dL — ABNORMAL HIGH (ref 0.44–1.00)
GFR calc Af Amer: 53 mL/min — ABNORMAL LOW (ref >60)
GFR calc Af Amer: 60 mL/min — ABNORMAL LOW (ref >60)
GFR calc non Af Amer: 46 mL/min — ABNORMAL LOW (ref >60)
GFR calc non Af Amer: 52 mL/min — ABNORMAL LOW (ref >60)
Glucose, Bld: 258 mg/dL — ABNORMAL HIGH (ref 70–99)
Glucose, Bld: 182 mg/dL — ABNORMAL HIGH (ref 70–99)
Phosphorus: 2.4 mg/dL — ABNORMAL LOW (ref 2.5–4.6)
Phosphorus: 2.7 mg/dL (ref 2.5–4.6)
Potassium: 4.3 mmol/L (ref 3.5–5.1)
Potassium: 4.6 mmol/L (ref 3.5–5.1)
Sodium: 135 mmol/L (ref 135–145)
Sodium: 134 mmol/L — ABNORMAL LOW (ref 135–145)

## 2018-10-16 LAB — MAGNESIUM: Magnesium: 2.4 mg/dL (ref 1.7–2.4)

## 2018-10-16 LAB — CBC
HCT: 34.6 % — ABNORMAL LOW (ref 36.0–46.0)
Hemoglobin: 9.9 g/dL — ABNORMAL LOW (ref 12.0–15.0)
MCH: 26.3 pg (ref 26.0–34.0)
MCHC: 28.6 g/dL — ABNORMAL LOW (ref 30.0–36.0)
MCV: 92 fL (ref 80.0–100.0)
Platelets: 146 10*3/uL — ABNORMAL LOW (ref 150–400)
RBC: 3.76 MIL/uL — ABNORMAL LOW (ref 3.87–5.11)
RDW: 26.4 % — ABNORMAL HIGH (ref 11.5–15.5)
WBC: 37.8 10*3/uL — ABNORMAL HIGH (ref 4.0–10.5)
nRBC: 2.3 % — ABNORMAL HIGH (ref 0.0–0.2)

## 2018-10-16 LAB — HEPARIN LEVEL (UNFRACTIONATED): Heparin Unfractionated: 0.23 IU/mL — ABNORMAL LOW (ref 0.30–0.70)

## 2018-10-16 LAB — GLUCOSE, CAPILLARY
Glucose-Capillary: 155 mg/dL — ABNORMAL HIGH (ref 70–99)
Glucose-Capillary: 157 mg/dL — ABNORMAL HIGH (ref 70–99)
Glucose-Capillary: 169 mg/dL — ABNORMAL HIGH (ref 70–99)
Glucose-Capillary: 209 mg/dL — ABNORMAL HIGH (ref 70–99)
Glucose-Capillary: 221 mg/dL — ABNORMAL HIGH (ref 70–99)
Glucose-Capillary: 70 mg/dL (ref 70–99)

## 2018-10-16 MED ORDER — MIDODRINE HCL 5 MG PO TABS
15.0000 mg | ORAL_TABLET | Freq: Three times a day (TID) | ORAL | Status: DC
  Administered 2018-10-16 – 2018-10-27 (×32): 15 mg via ORAL
  Filled 2018-10-16 (×33): qty 3

## 2018-10-16 MED ORDER — DOPAMINE-DEXTROSE 3.2-5 MG/ML-% IV SOLN
0.0000 ug/kg/min | INTRAVENOUS | Status: DC
  Administered 2018-10-16 – 2018-10-19 (×3): 5 ug/kg/min via INTRAVENOUS
  Filled 2018-10-16: qty 1250
  Filled 2018-10-16 (×2): qty 250

## 2018-10-16 MED ORDER — CLONAZEPAM 0.5 MG PO TBDP
0.5000 mg | ORAL_TABLET | Freq: Two times a day (BID) | ORAL | Status: DC
  Administered 2018-10-16 – 2018-10-17 (×3): 0.5 mg
  Filled 2018-10-16 (×3): qty 1

## 2018-10-16 MED ORDER — ALTEPLASE 2 MG IJ SOLR
2.0000 mg | Freq: Once | INTRAMUSCULAR | Status: AC
  Administered 2018-10-16: 2 mg
  Filled 2018-10-16: qty 2

## 2018-10-16 MED ORDER — EPINEPHRINE 1 MG/10ML IJ SOSY
PREFILLED_SYRINGE | INTRAMUSCULAR | Status: AC
  Filled 2018-10-16: qty 10

## 2018-10-16 NOTE — Progress Notes (Signed)
Updated family [name] primary contact) x2 today. Family desires for MD to call and update tomorrow. Family hasnt spoken to MD in a week. Thank you.

## 2018-10-16 NOTE — Progress Notes (Signed)
Patient ID: Kathleen [name], female   DOB: 02/18/1951, 68 y.o.   MRN: [id number]    Advanced Heart Failure Rounding Note   Subjective:     Awake on vent through trach. Interactive. Follows all commands.   Remains on CVVHD. Running slightly positive. NE was off yesterday but back on today.   Very weak. Denies CP or SOB  Remains in slow AF. Mostly in 85s. On heparin.    Objective:   Weight Range:  Vital Signs:   Temp:  [97.5 F (36.4 C)-98.3 F (36.8 C)] 97.9 F (36.6 C) (06/21 0800) Pulse Rate:  [40-52] 45 (06/21 0900) Resp:  [14-26] 20 (06/21 0900) BP: (70-119)/(33-67) 81/61 (06/21 0900) SpO2:  [97 %-100 %] 100 % (06/21 0900) FiO2 (%):  [40 %] 40 % (06/21 0833) Weight:  [99.3 kg-100.3 kg] 99.3 kg (06/21 0500) Last BM Date: 10/03/18  Weight change: Filed Weights   10/15/18 0200 10/15/18 1800 10/16/18 0500  Weight: 98.2 kg 100.3 kg 99.3 kg    Intake/Output:   Intake/Output Summary (Last 24 hours) at 10/16/2018 0945 Last data filed at 10/16/2018 0900 Gross per 24 hour  Intake 1684.95 ml  Output 689 ml  Net 995.95 ml     Physical Exam: General:  Sitting up in bed HEENT: normal Neck: supple. + trach no JVD. Carotids 2+ bilat; no bruits. No lymphadenopathy or thryomegaly appreciated. General:  Well appearing. No resp difficulty Cor: PMI nondisplaced. Irregular slow. No rubs, gallops or murmurs. + PC Lungs: clear Abdomen: soft, nontender, nondistended. No hepatosplenomegaly. No bruits or masses. Good bowel sounds. Extremities: no cyanosis, clubbing, rash, tr edema + boots  Neuro: alert & orientedx3, cranial nerves grossly intact. moves all 4 extremities w/o difficulty. Affect pleasant   Telemetry: AF 40-50s. Personally reviewed   Labs: Basic Metabolic Panel: Recent Labs  Lab 10/12/18 0430  10/13/18 0259  10/14/18 0600 10/14/18 1611 10/15/18 0107 10/15/18 3664 10/15/18 1632 10/16/18 0518  NA 134*   < >  --    < > 135 136 134* 134* 138 135  K 4.6   < >   --    < > 5.1 4.1 4.5 4.1 4.2 4.3  CL 102   < >  --    < > 101 102 101 101 106 103  CO2 21*   < >  --    < > _0 GLUCOSE 267*   < >  --    < > 186* 166* 272* 169* 125* 258*  BUN 44*   < >  --    < > 38* 38* 39* 33* 28* 27*  CREATININE 1.69*   < >  --    < > 1.70* 1.61* 1.42* 1.35* 1.23* 1.23*  CALCIUM 8.9   < >  --    < > 8.4* 7.8* 8.1* 8.2* 7.8* 7.8*  MG 2.8*  --  2.6*  --  2.8*  --   --  2.6*  --  2.4  PHOS 2.8   < >  --    < > 3.1 4.9* 3.3 2.6 1.9* 2.4*   < > = values in this interval not displayed.    Liver Function Tests: Recent Labs  Lab 10/10/18 1043  10/13/18 0259  10/14/18 1611 10/15/18 0107 10/15/18 4034 10/15/18 1632 10/16/18 0518  AST 33  --  38  --   --   --   --   --   --   ALT  7  --  23  --   --   --   --   --   --   ALKPHOS 120  --  88  --   --   --   --   --   --   BILITOT 1.1  --  1.6*  --   --   --   --   --   --   PROT 7.6  --  7.5  --   --   --   --   --   --   ALBUMIN 2.1*   < > 2.4*   < > 2.2* 2.2* 2.2* 2.0* 2.1*   < > = values in this interval not displayed.   No results for input(s): LIPASE, AMYLASE in the last 168 hours. Recent Labs  Lab 10/10/18 1043  AMMONIA 39*    CBC: Recent Labs  Lab 10/12/18 0430 10/12/18 0433 10/13/18 0259 10/14/18 0600 10/15/18 0107 10/16/18 0518  WBC 22.9*  --  20.4* 21.2* 22.1* 37.8*  HGB 12.2 15.6* 11.1* 11.5* 9.9* 9.9*  HCT 38.9 46.0 36.2 39.7 32.6* 34.6*  MCV 88.4  --  91.4 89.6 91.8 92.0  PLT PLATELET CLUMPS NOTED ON SMEAR, COUNT APPEARS DECREASED  --  118* 127* 73* 146*    Cardiac Enzymes: Recent Labs  Lab 10/13/18 0259 10/15/18 0053 10/15/18 0632 10/15/18 1238  TROPONINI 0.05* 0.04* 0.04* 0.04*    BNP: BNP (last 3 results) Recent Labs    09/22/18 1420  BNP 451.2*    ProBNP (last 3 results) No results for input(s): PROBNP in the last 8760 hours.    Other results:  Imaging: Dg Chest Port 1 View  Result Date: 10/14/2018 CLINICAL DATA:  68 year old female with  tracheostomy tube placement EXAM: PORTABLE CHEST 1 VIEW COMPARISON:  10/13/2018, 10/12/2018, 10/11/2018 FINDINGS: Cardiomediastinal silhouette unchanged in size and contour. Low lung volumes accentuate the interstitium and the vasculature. Interval removal of endotracheal tube in placement of tracheostomy which terminates at the level of the clavicular heads in the midline. Unchanged position of left upper extremity PICC with the catheter terminating in the superior vena cava. Unchanged right IJ central venous catheter with the tip appearing to terminate at the superior cavoatrial junction. Enteric feeding tube terminates out of the field of view. Reticular opacities at the lung bases with no interlobular septal thickening. No pneumothorax. IMPRESSION: Interval placement of tracheostomy tube and removal of endotracheal tube. Low lung volumes with likely atelectasis/consolidation. Unchanged right IJ hemodialysis catheter, left upper extremity PICC, and enteric feeding tube. Electronically Signed   By: Corrie Mckusick D.O.   On: 10/14/2018 12:37     Medications:     Scheduled Medications: . aspirin  81 mg Per Tube Daily  . atorvastatin  40 mg Oral q1800  . chlorhexidine gluconate (MEDLINE KIT)  15 mL Mouth Rinse BID  . Chlorhexidine Gluconate Cloth  6 each Topical Daily  . clonazePAM  1 mg Per Tube BID  . feeding supplement (PRO-STAT SUGAR FREE 64)  30 mL Per Tube QID  . feeding supplement (VITAL 1.5 CAL)  1,000 mL Per Tube Q24H  . hydrocortisone sodium succinate  50 mg Intravenous Q6H  . insulin aspart  0-20 Units Subcutaneous Q4H  . insulin aspart  10 Units Subcutaneous Q4H  . insulin detemir  30 Units Subcutaneous BID  . mouth rinse  15 mL Mouth Rinse 10 times per day  . midodrine  10 mg Oral TID WC  . phosphorus  125 mg Per Tube BID  . polyethylene glycol  17 g Oral Daily  . QUEtiapine  50 mg Oral BID  . sodium chloride flush  10-40 mL Intracatheter Q12H    Infusions: .  prismasol BGK  4/2.5 500 mL/hr at 10/16/18 0649  .  prismasol BGK 4/2.5 300 mL/hr at 10/15/18 1937  . sodium chloride 10 mL/hr at 10/16/18 0700  . famotidine (PEPCID) IV 20 mg (10/15/18 1413)  . heparin 1,200 Units/hr (10/16/18 0700)  . norepinephrine (LEVOPHED) Adult infusion 2 mcg/min (10/16/18 0845)  . prismasol BGK 4/2.5 1,500 mL/hr at 10/16/18 0743  . sodium chloride 999 mL/hr at 10/06/18 1018    PRN Medications: sodium chloride, acetaminophen (TYLENOL) oral liquid 160 mg/5 mL, bisacodyl, docusate, heparin, midazolam, ondansetron, rocuronium, sodium chloride, sodium chloride flush   Assessment/Plan:   1. PEA arrest on 5/30 - due to hypoxia/pulmonary edema. - now stable  2. Acute on chronic hypoxic respiratory failure - has underlying  OHS/OSA  - intubated on 5/30 in setting of arrest. extubated 6/3.reintubated 6/11 with worsening RUL aispace disease.  - treated for MSSA PNA with cefipime/vanc.  - s/p trach 6/19. Tolerating well but still on vent. Continue to wean - Management per CCM   3.  Acute on chronic diastolic HF - Admitted with markedly elevated filling pressures complicated by severe cardiorenal syndrome - Now down 85 pounds with CVVHD. - Volume management per Renal and CVVHD. Now appears euvolemic - ? Need to w/u for possible TTR amyloid  4. Pulmonary HTN, severe  - this is mostly pulmonary venous HTN by cath [facility] Group 2) but also has a component of OHS/OSA (WHO group 3). Improved with volume removal.  - no role for selective pulmonary vasodilators  5. AKI on CKD 3 - baseline creatinine 1.9 -> ESRD - Remains on CVVHD. Renal managing - Hopefully we can get on iHD once off NE. Will increase midodrine to 15 tid to help facilitate  6. Morbid obesity - needs weight loss  6. Chronic AF/AFL - Off apixaban. Now on heparin. No bleeding.Discussed dosing with PharmD personally. - Rhythm is slow AF today - No role for PPM currently - Will ikely need permanent HD access placed  so will not start warfarin yet   7. VT/NSVT - Quiescent.  off amio due to bradycardia - Keep K> 4.0 Mg 2.0  8. MSSA PNA - Bcx drawn 6/2 & 6/11. NGTD - sputum cx with MSSA PNA - CXR with worsening RUL PNA. Switched to cefipime/vanc  9. Hypotension - ?  Due to sepsis.  - Now on midodrine 10 tid and NE 3 - Continue to wean NE as tolerated. Increase midodrine to 15 tid.   CRITICAL CARE Performed by: Glori Bickers  Total critical care time: 35 minutes  Critical care time was exclusive of separately billable procedures and treating other patients.  Critical care was necessary to treat or prevent imminent or life-threatening deterioration.  Critical care was time spent personally by me (independent of midlevel providers or residents) on the following activities: development of treatment plan with patient and/or surrogate as well as nursing, discussions with consultants, evaluation of patient's response to treatment, examination of patient, obtaining history from patient or surrogate, ordering and performing treatments and interventions, ordering and review of laboratory studies, ordering and review of radiographic studies, pulse oximetry and re-evaluation of patient's condition.     Length of Stay: 24  Glori Bickers MD 10/16/2018, 9:45 AM  Advanced Heart Failure Team Pager [phone] (M-F;  7a - 4p)  Please contact [facility] for night-coverage after hours (4p -7a ) and weekends on [email or web address]

## 2018-10-16 NOTE — Progress Notes (Signed)
[facility]    NEPHROLOGY PROGRESS NOTE  SUBJECTIVE: Patient seen and examined.  Denies chest pain, shortness of breath, nausea, vomiting, diarrhea or dysuria.  Chart reviewed extensively.  No acute events noted.  Discussed with the nurse at bedside.  Remains on a low-dose of Levophed.   OBJECTIVE:  Vitals:   10/16/18 1100 10/16/18 1300  BP: (!) 100/45 (!) 99/54  Pulse: (!) 45 (!) 42  Resp: 17 20  Temp:    SpO2: 96% 100%    Intake/Output Summary (Last 24 hours) at 10/16/2018 1352 Last data filed at 10/16/2018 1300 Gross per 24 hour  Intake 1863.25 ml  Output 685 ml  Net 1178.25 ml      General: Sedated NAD HEENT: MMM [place] AT anicteric sclera Neck:  No JVD, no adenopathy CV:  Heart RRR  Lungs:  L/S CTA bilaterally Abd:  abd SNT/ND with normal BS GU:  Bladder non-palpable Extremities: Trace bilateral LE edema. Skin:  No skin rash  MEDICATIONS:  . aspirin  81 mg Per Tube Daily  . atorvastatin  40 mg Oral q1800  . chlorhexidine gluconate (MEDLINE KIT)  15 mL Mouth Rinse BID  . Chlorhexidine Gluconate Cloth  6 each Topical Daily  . clonazePAM  0.5 mg Per Tube BID  . feeding supplement (PRO-STAT SUGAR FREE 64)  30 mL Per Tube QID  . feeding supplement (VITAL 1.5 CAL)  1,000 mL Per Tube Q24H  . hydrocortisone sodium succinate  50 mg Intravenous Q6H  . insulin aspart  0-20 Units Subcutaneous Q4H  . insulin aspart  10 Units Subcutaneous Q4H  . insulin detemir  30 Units Subcutaneous BID  . mouth rinse  15 mL Mouth Rinse 10 times per day  . midodrine  15 mg Oral TID WC  . phosphorus  125 mg Per Tube BID  . polyethylene glycol  17 g Oral Daily  . QUEtiapine  50 mg Oral BID  . sodium chloride flush  10-40 mL Intracatheter Q12H       LABS:   CBC Latest Ref Rng & Units 10/16/2018 10/15/2018 10/14/2018  WBC 4.0 - 10.5 K/uL 37.8(H) 22.1(H) 21.2(H)  Hemoglobin 12.0 - 15.0 g/dL 9.9(L) 9.9(L) 11.5(L)  Hematocrit 36.0 - 46.0 % 34.6(L) 32.6(L) 39.7  Platelets 150 -  400 K/uL 146(L) 73(L) 127(L)    CMP Latest Ref Rng & Units 10/16/2018 10/15/2018 10/15/2018  Glucose 70 - 99 mg/dL 258(H) 125(H) 169(H)  BUN 8 - 23 mg/dL 27(H) 28(H) 33(H)  Creatinine 0.44 - 1.00 mg/dL 1.23(H) 1.23(H) 1.35(H)  Sodium 135 - 145 mmol/L 135 138 134(L)  Potassium 3.5 - 5.1 mmol/L 4.3 4.2 4.1  Chloride 98 - 111 mmol/L 103 106 101  CO2 22 - 32 mmol/L 24 24 23   Calcium 8.9 - 10.3 mg/dL 7.8(L) 7.8(L) 8.2(L)  Total Protein 6.5 - 8.1 g/dL - - -  Total Bilirubin 0.3 - 1.2 mg/dL - - -  Alkaline Phos 38 - 126 U/L - - -  AST 15 - 41 U/L - - -  ALT 0 - 44 U/L - - -    Lab Results  Component Value Date   PTH 33 10/12/2018   CALCIUM 7.8 (L) 10/16/2018   CAION 1.18 10/12/2018   PHOS 2.4 (L) 10/16/2018       Component Value Date/Time   COLORURINE AMBER (A) 05/22/2013 1826   APPEARANCEUR HAZY (A) 05/22/2013 1826   LABSPEC 1.024 05/22/2013 1826   PHURINE 6.0 05/22/2013 1826   GLUCOSEU NEGATIVE 05/22/2013 1826  HGBUR NEGATIVE 05/22/2013 1826   BILIRUBINUR SMALL (A) 05/22/2013 1826   KETONESUR NEGATIVE 05/22/2013 1826   PROTEINUR 100 (A) 05/22/2013 1826   UROBILINOGEN 4.0 (H) 05/22/2013 1826   NITRITE NEGATIVE 05/22/2013 1826   LEUKOCYTESUR NEGATIVE 05/22/2013 1826      Component Value Date/Time   PHART 7.368 10/12/2018 0433   PCO2ART 42.7 10/12/2018 0433   PO2ART 126.0 (H) 10/12/2018 0433   HCO3 24.6 10/12/2018 0433   TCO2 26 10/12/2018 0433   ACIDBASEDEF 1.0 10/12/2018 0433   O2SAT 99.0 10/12/2018 0433       Component Value Date/Time   IRON 44 10/12/2018 1640   TIBC 379 10/12/2018 1640   IRONPCTSAT 12 10/12/2018 1640       ASSESSMENT/PLAN:    68 year old female patient with chronic diastolic heart failure who failed outpatient diuretic therapy underwent a right heart catheterization on 5/29 showing markedly elevated biventricular filling pressures and had a subsequent PEA arrest on 5/30.  She was intubated and ROSC was achieved.  She was started on CRRT on 5/31 for  acute kidney injury.  She has had an ongoing pressor requirement.  1.  Chronic kidney disease stage III with a baseline serum creatinine around 1.7-1.9.  I suspect her chronic kidney disease is on the basis of longstanding hypertension and diabetes.  Intact PTH 33 on 10/12/2018.  2.  Acute kidney injury.  Likely ATN in the setting of shock.  Has had no recovery of renal function yet.  Remains on CRRT.  Chest x-ray consistent with pulmonary congestion.  Continues on CRRT due to pressor requirement.   She has lost about 39 kg to date from CRRT.  Phosphorus stable on CRRT.  Will keep even on CRRT.  3.  Chronic respiratory failure.  Status post tracheostomy today.  6.  Chronic diastolic heart failure.  Appears relatively euvolemic.     [place], DO, [place]

## 2018-10-16 NOTE — Progress Notes (Signed)
Red port in PICC line flushes but only draws back ~13ml blood. Last cxr shows correct placement. Dr. Chase Caller ok to [facility] PICC.

## 2018-10-16 NOTE — Progress Notes (Signed)
[place] for Heparin Indication: atrial fibrillation  Allergies  Allergen Reactions  . Doxycycline     REACTION: Wheals and pruritus    Patient Measurements: Height: 5' (152.4 cm) Weight: 218 lb 14.7 oz (99.3 kg) IBW/kg (Calculated) : 45.5  HEPARIN DW (KG): 71.8  Vital Signs: Temp: 97.5 F (36.4 C) (06/21 0300) Temp Source: Axillary (06/21 0300) BP: 119/47 (06/21 0530) Pulse Rate: 46 (06/21 0405)  Labs: Recent Labs    10/14/18 0600  10/14/18 1913 10/15/18 0053 10/15/18 0107 10/15/18 0459 10/15/18 0102 10/15/18 1238 10/15/18 1632 10/16/18 0518  HGB 11.5*  --   --   --  9.9*  --   --   --   --   --   HCT 39.7  --   --   --  32.6*  --   --   --   --   --   PLT 127*  --   --   --  73*  --   --   --   --   --   HEPARINUNFRC <0.10*  --  0.34  --   --  0.47  --   --   --  0.23*  CREATININE 1.70*   < >  --   --  1.42*  --  1.35*  --  1.23*  --   TROPONINI  --   --   --  0.04*  --   --  0.04* 0.04*  --   --    < > = values in this interval not displayed.    Estimated Creatinine Clearance: 47.6 mL/min (A) (by C-G formula based on SCr of 1.23 mg/dL (H)).  Assessment: 68 y.o. female with h/o Afib, Eliquis on hold, for heparin  Goal of Therapy:  Heparin level 0.3-0.7 units/ml  Monitor platelets by anticoagulation protocol: Yes   Plan:  Increase Heparin 1200 units/hr  Phillis Knack, PharmD, BCPS  10/16/2018 6:08 AM

## 2018-10-16 NOTE — Progress Notes (Addendum)
Sat pt on side of bed (total assist), (RN had been slowly increasing HOB to 90 before attempting to sit) and bp    10/16/18 1024 10/16/18 1026  Vitals  BP (!) 73/27 (!) 50/37  MAP (mmHg) (!) 41 (!) 43  BP Location Right Wrist Right Wrist  pt became diaphoretic and glassy eyed. Still able to respond, assisted back to bed and bp increased to 100/46. Pt now resting in bed,able to communicate with RN. Reported to Dr. Chase Caller and Dr. Haroldine Laws.

## 2018-10-16 NOTE — Progress Notes (Signed)
Attempted to remove tpa from red port on picc line, unable to remove tpa. Has been instilled for 3 hours. Red port flushed, however still only 1cc blood return. RN made aware.

## 2018-10-16 NOTE — Progress Notes (Signed)
NAME:  Kathleen [name], MRN:  [id number], DOB:  October 06, 1950, LOS: 24 ADMISSION DATE:  09/22/2018, CONSULTATION DATE: 09/24/2018 REFERRING MD: Candee Furbish MD, CHIEF COMPLAINT: Cardiac arrest  Brief History   68 y/o obese woman with chronic diastolic heart failure who failed outpatient diuretics management due to chronic kidney disease.  Right heart cath on 5/29 showed markedly elevated biventricular filling pressures with normal cardiac output.  She was on cardiology service, maintained on milrinone and Lasix.  On the morning of 5/30 she went into PEA arrest, CPR performed for 8 minutes.  Intubated and transferred to ICU.  Started on CRRT 5/31.  Extubated 6/3.  Re-intubated early am 6/11 for hypercarbic respiratory failure  Past Medical History   has a past medical history of Atrial flutter ([place]), Bradycardia, Chronic diastolic (congestive) heart failure ([place]) (01/10/2015), CKD (chronic kidney disease), stage IV ([place]), Degenerative joint disease of hand, Diabetes mellitus, Dyslipidemia, Fecal occult blood test positive, GERD (gastroesophageal reflux disease), Headache, Hypertension, Inadequate material resources, Irritable bowel syndrome, Morbid obesity ([place]), Obesity hypoventilation syndrome ([place]), Post-menopausal bleeding, and Shortness of breath dyspnea.  Significant Hospital Events   5/28 Admit 5/29 RHC 5/30 PEA arrest, intubated/ CRRT 5/31 Milrinone stopped due to ectopy; brady episode- amio stopped 6/02 Attempt at SBT, chest x-ray with worsening pulmonary edema, CVVHD for volume removal 6/03 Extubated > remain on CRRT.  Refused nocturnal BiPAP 6/04 Refused nocturnal BiPAP 6/10 off CRRT 6/11 Intubated early am with hypercarbia, concern for RUL PNA; abx broadened; restarted on CRRT 6/16  No events overnight, weaning on high PS 6/17  failed vent wean, remains on levophed, midodrine and CRRT 6/18  Weaned for 5 hours on PSV 8/5 before requiring full support; ongoing CRRT, remains on levophed 14  mcg 6/19 - TRACH + Dr Erskine Emery 6/20 -. On IV heparin gt, levophed gtt with midodrine. Off fent gtt. Pressopr needs down  Consults:  Cardiology PCCM  Nephrology   Procedures:  Lt PICC 5/29 >> OETT 5/30 >> 6/3; 6/11 > 6/19 - TRACH (Dr Erskine Emery) >> R IJ HD cath 5/30 >> Aline left ulnar 5/30 >> 6/2  Significant Diagnostic Tests:  TTE 5/28 >>The left ventricle has normal systolic function with an ejection fraction of 60-65%. The cavity size was normal. Left ventricular diastolic doppler parameters are indeterminate. No evidence of left ventricular regional wall motion abnormalities. The right ventricle has normal systolic function. The cavity was mildly enlarged. There is no increase in right ventricular wall thickness. Right atrial size was mildly dilated. The aortic valve is tricuspid. Mild thickening of the aortic valve. Mild calcification of the aortic valve. Aortic valve regurgitation is trivial by color flow Doppler.The aortic root is normal in size and structure.  Right heart cath 5/29 RA = 24 RV = 94/26 PA = 95/36 (53) PCW = 30 (v = 50) Fick cardiac output/index = 6.0/2.7 PVR =3.9 WU FA sat = 91% PA sat = 58%, 58% SVC 60%  CXR 6/15> Bilateral ASD LLL > R. ETT, GT, LIJ and RIJ CVCs stable position.   Micro Data:  SARS coronavirus 2 cepheid 5/28 >> neg MRSA PCR 5/28 >> neg Trach aspirate 6/2 >> MSSA BCx2 6/2 >> negative ...................... Tracheal aspirate 6/11 >> few GNR >> few proteus and few candida tropicalis BCx2 6/11 >> negative  Antimicrobials:  Vancomycin 6/3 > 6/4 Cefazolin 6/4 >> 6/10 ...........................Marland Kitchen Vanco 6/11 >> 6/17 Cefepime 6/11 >>6/18  Interim history/subjective:    6/21 -on low dose levophed. Cards increasing midodrine. RN  plans to sit her up on chair. On CRRT. Slow AF per card. On IV heparin and milrinone  Objective   Blood pressure (!) 81/61, pulse (!) 45, temperature 97.9 F (36.6 C), temperature source Oral, resp. rate 20,  height 5' (1.524 m), weight 99.3 kg, SpO2 100 %. CVP:  [12 mmHg] 12 mmHg  Vent Mode: PSV;CPAP FiO2 (%):  [40 %] 40 % Set Rate:  [18 bmp] 18 bmp Vt Set:  [420 mL] 420 mL PEEP:  [5 cmH20] 5 cmH20 Pressure Support:  [16 cmH20] 16 cmH20 Plateau Pressure:  [18 cmH20-22 cmH20] 22 cmH20   Intake/Output Summary (Last 24 hours) at 10/16/2018 1012 Last data filed at 10/16/2018 0900 Gross per 24 hour  Intake 1484.95 ml  Output 671 ml  Net 813.95 ml   Filed Weights   10/15/18 0200 10/15/18 1800 10/16/18 0500  Weight: 98.2 kg 100.3 kg 99.3 kg    General Appearance:  Looks chronic unwell but much better. Smiling Head:  Normocephalic, without obvious abnormality, atraumatic Eyes:  PERRL - yes, conjunctiva/corneas - clear     Ears:  Normal external ear canals, both ears Nose:  G tube - yes Throat:  ETT TUBE - no , OG tube - no. TRACH +.  Neck:  Supple,  No enlargement/tenderness/nodules Lungs: Clear to auscultation bilaterally, Ventilator   Synchrony - yes Heart:  S1 and S2 normal, no murmur, CVP - no.  Pressors - low dose leveophed Abdomen:  Soft, no masses, no organomegaly Genitalia / Rectal:  Not done Extremities:  Extremities- intact Skin:  ntact in exposed areas . Sacral area - not examned Neurologic:  Sedation - none -> RASS - +1 . Moves all 4s - yes. CAM-ICU - neg . Orientation - full oriented      LABS    PULMONARY Recent Labs  Lab 10/10/18 0423 10/12/18 0433  PHART 7.384 7.368  PCO2ART 43.4 42.7  PO2ART 69.0* 126.0*  HCO3 25.9 24.6  TCO2 27 26  O2SAT 93.0 99.0    CBC Recent Labs  Lab 10/14/18 0600 10/15/18 0107 10/16/18 0518  HGB 11.5* 9.9* 9.9*  HCT 39.7 32.6* 34.6*  WBC 21.2* 22.1* 37.8*  PLT 127* 73* 146*    COAGULATION Recent Labs  Lab 10/13/18 0259  INR 1.2    CARDIAC   Recent Labs  Lab 10/13/18 0259 10/15/18 0053 10/15/18 0632 10/15/18 1238  TROPONINI 0.05* 0.04* 0.04* 0.04*   No results for input(s): PROBNP in the last 168 hours.    CHEMISTRY Recent Labs  Lab 10/12/18 0430  10/13/18 0259  10/14/18 0600 10/14/18 1611 10/15/18 0107 10/15/18 0632 10/15/18 1632 10/16/18 0518  NA 134*   < >  --    < > 135 136 134* 134* 138 135  K 4.6   < >  --    < > 5.1 4.1 4.5 4.1 4.2 4.3  CL 102   < >  --    < > 101 102 101 101 106 103  CO2 21*   < >  --    < > 22 23 22 23 24 24   GLUCOSE 267*   < >  --    < > 186* 166* 272* 169* 125* 258*  BUN 44*   < >  --    < > 38* 38* 39* 33* 28* 27*  CREATININE 1.69*   < >  --    < > 1.70* 1.61* 1.42* 1.35* 1.23* 1.23*  CALCIUM 8.9   < >  --    < >  8.4* 7.8* 8.1* 8.2* 7.8* 7.8*  MG 2.8*  --  2.6*  --  2.8*  --   --  2.6*  --  2.4  PHOS 2.8   < >  --    < > 3.1 4.9* 3.3 2.6 1.9* 2.4*   < > = values in this interval not displayed.   Estimated Creatinine Clearance: 47.6 mL/min (A) (by C-G formula based on SCr of 1.23 mg/dL (H)).   LIVER Recent Labs  Lab 10/10/18 1043  10/13/18 0259  10/14/18 1611 10/15/18 0107 10/15/18 1610 10/15/18 1632 10/16/18 0518  AST 33  --  38  --   --   --   --   --   --   ALT 7  --  23  --   --   --   --   --   --   ALKPHOS 120  --  88  --   --   --   --   --   --   BILITOT 1.1  --  1.6*  --   --   --   --   --   --   PROT 7.6  --  7.5  --   --   --   --   --   --   ALBUMIN 2.1*   < > 2.4*   < > 2.2* 2.2* 2.2* 2.0* 2.1*  INR  --   --  1.2  --   --   --   --   --   --    < > = values in this interval not displayed.     INFECTIOUS Recent Labs  Lab 10/13/18 0259 10/13/18 0315 10/14/18 0600  LATICACIDVEN  --  2.0*  --   PROCALCITON 0.19  --  0.21     ENDOCRINE CBG (last 3)  Recent Labs    10/15/18 2307 10/16/18 0339 10/16/18 0739  GLUCAP 136* 209* 221*   IMAGING x48h  Dg Chest Port 1 View  Result Date: 10/14/2018 CLINICAL DATA:  68 year old female with tracheostomy tube placement EXAM: PORTABLE CHEST 1 VIEW COMPARISON:  10/13/2018, 10/12/2018, 10/11/2018 FINDINGS: Cardiomediastinal silhouette unchanged in size and contour. Low lung  volumes accentuate the interstitium and the vasculature. Interval removal of endotracheal tube in placement of tracheostomy which terminates at the level of the clavicular heads in the midline. Unchanged position of left upper extremity PICC with the catheter terminating in the superior vena cava. Unchanged right IJ central venous catheter with the tip appearing to terminate at the superior cavoatrial junction. Enteric feeding tube terminates out of the field of view. Reticular opacities at the lung bases with no interlobular septal thickening. No pneumothorax. IMPRESSION: Interval placement of tracheostomy tube and removal of endotracheal tube. Low lung volumes with likely atelectasis/consolidation. Unchanged right IJ hemodialysis catheter, left upper extremity PICC, and enteric feeding tube. Electronically Signed   By: Corrie Mckusick D.O.   On: 10/14/2018 12:37   Resolved Hospital Problem list    Assessment & Plan:  ASSESSMENT / PLAN:  PULMONARY A:  Baseline hx - OSA/OHV - non compliant  Acute on chronic resp failure following cardiac arrest- prolonged mech vent and s/p trach 10/14/2018  6/21 - doing well on trach  P:   Full vent support Ongoing pulmonary hygiene  NEUROLOGIC A:   Acute Encephalopathy - resolved 6/19. Off fent gtt as of 10/15/2018   P:   Seroquel 50mg  BID Versed prn Reduce klonopin from 1mg  bid to  0.5mg  bid ICU delirium precaution RN intervention  VASCULAR A:   Circulatory shock - ?if she is still dry, pressor requirement improved after fluid bolus yesterday  6/21 - improving post trch but still pressor dependent  P:  Continue midodrone 10mg  tid Ongoing levophed Continue stress dose steroids   CARDIAC A: PEA arrest : -suspect hypoxia driven Acute on chronic diastolic heart failure --admit with elevated filling pressures, cardiorenal syndrome  Pulmonary hypertension, severe : -s/p RHC, WHO class 2,3 Permanent AF/AFl - -on anticoagulation pre admit NSVT/VT  - -off amio due to bradycardia    P: Per HF team  Heparin gtt  INFECTIOUS A:   MSSA PNA  6/2 Proteus VAP 6/11 P:   Completed cefepime 8 days 6/18, WBC stable/ remains afebrile  RENAL A:  Cardiorenal syndrome AKI/ CKD 3-4, remains anuric P:  CRRT per nephrology   ENDOCRINE A:   Morbid OBestity DM- ongoing hyperglycemia 6/18  P:   Levemir increased to 30 units BID TF coverage 10 units q 4 SSI resistant   HEMATOLOGIC A:  At risk anemia critical illness P:  - PRBC for hgb </= 6.9gm%    - exceptions are   -  if ACS susepcted/confirmed then transfuse for hgb </= 8.0gm%,  or    -  active bleeding with hemodynamic instability, then transfuse regardless of hemoglobin value   At at all times try to transfuse 1 unit prbc as possible with exception of active hemorrhage    GI A:   Tolerting TF Constipation P:   TF currently held Continue PPI  Ongoing scheduled bowel regimen miralax daily,  no reported BM since 6/3 Dulcolax    MSK/DERM RT hip blister - 6/17 P  - skin dressing per RN, no issues per RN  Best practice:  Diet: NPO for trach, will resume TF per cortrak  Pain/Anxiety/Delirium protocol (if indicated):  In place  VAP protocol (if indicated): In place  DVT prophylaxis: Heparin gtt GI prophylaxis: Pepcid Glucose control: SSI Mobility: Bed Code Status: Full Family Communication:patient bedside 10/16/2018  Disposition: ICU   ATTESTATION & SIGNATURE   The patient Kathleen [name] is critically ill with multiple organ systems failure and requires high complexity decision making for assessment and support, frequent evaluation and titration of therapies, application of advanced monitoring technologies and extensive interpretation of multiple databases.   Critical Care Time devoted to patient care services described in this note is  30  Minutes. This time reflects time of care of this signee Dr Brand Males. This critical care time does not reflect  procedure time, or teaching time or supervisory time of PA/NP/Med student/Med Resident etc but could involve care discussion time     Dr. Brand Males, M.D., [facility].C.P Pulmonary and Critical Care Medicine Staff Physician [facility] Pager: [phone], If no answer or between  15:00h - 7:00h: call [phone]  10/16/2018 10:12 AM

## 2018-10-17 DIAGNOSIS — I495 Sick sinus syndrome: Secondary | ICD-10-CM

## 2018-10-17 DIAGNOSIS — Z930 Tracheostomy status: Secondary | ICD-10-CM

## 2018-10-17 LAB — RENAL FUNCTION PANEL
Albumin: 1.8 g/dL — ABNORMAL LOW (ref 3.5–5.0)
Albumin: 1.9 g/dL — ABNORMAL LOW (ref 3.5–5.0)
Anion gap: 6 (ref 5–15)
Anion gap: 8 (ref 5–15)
BUN: 25 mg/dL — ABNORMAL HIGH (ref 8–23)
BUN: 29 mg/dL — ABNORMAL HIGH (ref 8–23)
CO2: 23 mmol/L (ref 22–32)
CO2: 25 mmol/L (ref 22–32)
Calcium: 7.5 mg/dL — ABNORMAL LOW (ref 8.9–10.3)
Calcium: 8.1 mg/dL — ABNORMAL LOW (ref 8.9–10.3)
Chloride: 103 mmol/L (ref 98–111)
Chloride: 102 mmol/L (ref 98–111)
Creatinine, Ser: 1.13 mg/dL — ABNORMAL HIGH (ref 0.44–1.00)
Creatinine, Ser: 1.34 mg/dL — ABNORMAL HIGH (ref 0.44–1.00)
GFR calc Af Amer: 59 mL/min — ABNORMAL LOW (ref >60)
GFR calc Af Amer: 48 mL/min — ABNORMAL LOW (ref >60)
GFR calc non Af Amer: 51 mL/min — ABNORMAL LOW (ref >60)
GFR calc non Af Amer: 41 mL/min — ABNORMAL LOW (ref >60)
Glucose, Bld: 230 mg/dL — ABNORMAL HIGH (ref 70–99)
Glucose, Bld: 136 mg/dL — ABNORMAL HIGH (ref 70–99)
Phosphorus: 2.5 mg/dL (ref 2.5–4.6)
Phosphorus: 2.6 mg/dL (ref 2.5–4.6)
Potassium: 4.2 mmol/L (ref 3.5–5.1)
Potassium: 4.5 mmol/L (ref 3.5–5.1)
Sodium: 132 mmol/L — ABNORMAL LOW (ref 135–145)
Sodium: 135 mmol/L (ref 135–145)

## 2018-10-17 LAB — HEPARIN LEVEL (UNFRACTIONATED): Heparin Unfractionated: 0.33 IU/mL (ref 0.30–0.70)

## 2018-10-17 LAB — GLUCOSE, CAPILLARY
Glucose-Capillary: 159 mg/dL — ABNORMAL HIGH (ref 70–99)
Glucose-Capillary: 155 mg/dL — ABNORMAL HIGH (ref 70–99)
Glucose-Capillary: 155 mg/dL — ABNORMAL HIGH (ref 70–99)
Glucose-Capillary: 99 mg/dL (ref 70–99)
Glucose-Capillary: 138 mg/dL — ABNORMAL HIGH (ref 70–99)
Glucose-Capillary: 127 mg/dL — ABNORMAL HIGH (ref 70–99)

## 2018-10-17 LAB — MAGNESIUM: Magnesium: 2.3 mg/dL (ref 1.7–2.4)

## 2018-10-17 LAB — CBC
HCT: 34.4 % — ABNORMAL LOW (ref 36.0–46.0)
Hemoglobin: 9.8 g/dL — ABNORMAL LOW (ref 12.0–15.0)
MCH: 26.7 pg (ref 26.0–34.0)
MCHC: 28.5 g/dL — ABNORMAL LOW (ref 30.0–36.0)
MCV: 93.7 fL (ref 80.0–100.0)
Platelets: 141 10*3/uL — ABNORMAL LOW (ref 150–400)
RBC: 3.67 MIL/uL — ABNORMAL LOW (ref 3.87–5.11)
RDW: 27.2 % — ABNORMAL HIGH (ref 11.5–15.5)
WBC: 36.2 10*3/uL — ABNORMAL HIGH (ref 4.0–10.5)
nRBC: 0.8 % — ABNORMAL HIGH (ref 0.0–0.2)

## 2018-10-17 LAB — PROCALCITONIN: Procalcitonin: 0.4 ng/mL

## 2018-10-17 MED ORDER — INSULIN ASPART 100 UNIT/ML ~~LOC~~ SOLN
10.0000 [IU] | SUBCUTANEOUS | Status: DC
  Administered 2018-10-17 – 2018-10-18 (×6): 10 [IU] via SUBCUTANEOUS

## 2018-10-17 MED ORDER — QUETIAPINE FUMARATE 50 MG PO TABS: 50.0000 mg | ORAL_TABLET | Freq: Every day | ORAL | Status: DC

## 2018-10-17 MED ORDER — CHLORHEXIDINE GLUCONATE CLOTH 2 % EX PADS
6.0000 | MEDICATED_PAD | Freq: Every day | CUTANEOUS | Status: DC
  Administered 2018-10-18 – 2018-11-13 (×16): 6 via TOPICAL

## 2018-10-17 MED ORDER — HYDROCORTISONE NA SUCCINATE PF 100 MG IJ SOLR: 50.0000 mg | Freq: Two times a day (BID) | INTRAMUSCULAR | Status: DC

## 2018-10-17 NOTE — Progress Notes (Signed)
Assisted tele visit to patient with family member.  Kashlynn Kundert M, RN  

## 2018-10-17 NOTE — Progress Notes (Signed)
Patient ID: Kathleen [name], female   DOB: March 03, 1951, 68 y.o.   MRN: [id number]    Advanced Heart Failure Rounding Note   Subjective:     Awake on vent through trach. Interactive. Follows all commands.   Remains on CVVHD running even. Remains pressor dependent on NE 3. Dopa 5 added last night due to sustained bradycardia in 30s. Midodrine also increased to 15 tid. We tried to sit her up yesterday and pressure dropped profoundly.   Denies SOB, orthopnea or PND.   Remains on heparin. No bleeding.    Objective:   Weight Range:  Vital Signs:   Temp:  [97.3 F (36.3 C)-98.4 F (36.9 C)] 98.4 F (36.9 C) (06/22 0802) Pulse Rate:  [33-64] 55 (06/22 0802) Resp:  [13-24] 21 (06/22 0802) BP: (50-114)/(27-61) 106/52 (06/22 0800) SpO2:  [93 %-100 %] 99 % (06/22 0802) FiO2 (%):  [40 %] 40 % (06/22 0802) Weight:  [99.8 kg] 99.8 kg (06/22 0600) Last BM Date: 10/15/18  Weight change: Filed Weights   10/15/18 1800 10/16/18 0500 10/17/18 0600  Weight: 100.3 kg 99.3 kg 99.8 kg    Intake/Output:   Intake/Output Summary (Last 24 hours) at 10/17/2018 0840 Last data filed at 10/17/2018 0800 Gross per 24 hour  Intake 2085.55 ml  Output 1135 ml  Net 950.55 ml     Physical Exam: General:  Lying in bed on CVVHD  No resp difficulty HEENT: normal Neck: supple. No obvious JVD Carotids 2+ bilat; no bruits. No lymphadenopathy or thryomegaly appreciated. Cor: PMI nondisplaced. Irregular brady. No rubs, gallops or murmurs. + PC Lungs: clear Abdomen: obese soft, nontender, nondistended. No hepatosplenomegaly. No bruits or masses. Good bowel sounds. Extremities: no cyanosis, clubbing, rash, trace edema + boots Neuro: alert & orientedx3, cranial nerves grossly intact. moves all 4 extremities w/o difficulty. Affect pleasant    Telemetry: AF 40-50s. (was 30s overnight) Personally reviewed   Labs: Basic Metabolic Panel: Recent Labs  Lab 10/13/18 0259  10/14/18 0600  10/15/18 0632 10/15/18  1632 10/16/18 0518 10/16/18 1512 10/17/18 0340  NA  --    < > 135   < > 134* 138 135 134* 132*  K  --    < > 5.1   < > 4.1 4.2 4.3 4.6 4.2  CL  --    < > 101   < > 101 106 103 101 103  CO2  --    < > 22   < > _0 GLUCOSE  --    < > 186*   < > 169* 125* 258* 182* 230*  BUN  --    < > 38*   < > 33* 28* 27* 28* 25*  CREATININE  --    < > 1.70*   < > 1.35* 1.23* 1.23* 1.11* 1.13*  CALCIUM  --    < > 8.4*   < > 8.2* 7.8* 7.8* 8.2* 7.5*  MG 2.6*  --  2.8*  --  2.6*  --  2.4  --  2.3  PHOS  --    < > 3.1   < > 2.6 1.9* 2.4* 2.7 2.5   < > = values in this interval not displayed.    Liver Function Tests: Recent Labs  Lab 10/10/18 1043  10/13/18 0259  10/15/18 5284 10/15/18 1632 10/16/18 0518 10/16/18 1512 10/17/18 0340  AST 33  --  38  --   --   --   --   --   --  ALT 7  --  23  --   --   --   --   --   --   ALKPHOS 120  --  88  --   --   --   --   --   --   BILITOT 1.1  --  1.6*  --   --   --   --   --   --   PROT 7.6  --  7.5  --   --   --   --   --   --   ALBUMIN 2.1*   < > 2.4*   < > 2.2* 2.0* 2.1* 2.0* 1.8*   < > = values in this interval not displayed.   No results for input(s): LIPASE, AMYLASE in the last 168 hours. Recent Labs  Lab 10/10/18 1043  AMMONIA 39*    CBC: Recent Labs  Lab 10/13/18 0259 10/14/18 0600 10/15/18 0107 10/16/18 0518 10/17/18 0340  WBC 20.4* 21.2* 22.1* 37.8* 36.2*  HGB 11.1* 11.5* 9.9* 9.9* 9.8*  HCT 36.2 39.7 32.6* 34.6* 34.4*  MCV 91.4 89.6 91.8 92.0 93.7  PLT 118* 127* 73* 146* 141*    Cardiac Enzymes: Recent Labs  Lab 10/13/18 0259 10/15/18 0053 10/15/18 0632 10/15/18 1238  TROPONINI 0.05* 0.04* 0.04* 0.04*    BNP: BNP (last 3 results) Recent Labs    09/22/18 1420  BNP 451.2*    ProBNP (last 3 results) No results for input(s): PROBNP in the last 8760 hours.    Other results:  Imaging: Dg Chest Port 1 View  Result Date: 10/16/2018 CLINICAL DATA:  Shortness of breath and pulmonary edema EXAM:  PORTABLE CHEST 1 VIEW COMPARISON:  10/14/2018 and prior radiographs FINDINGS: Tracheostomy tube, small bore feeding tube, RIGHT IJ central venous catheter with tip overlying the SUPERIOR cavoatrial junction and LEFT PICC line with tip overlying the UPPER SVC again noted. Interstitial opacities/edema bibasilar opacities/atelectasis are not significantly changed. No pneumothorax. IMPRESSION: Unchanged appearance of the chest with bilateral interstitial opacities/edema bibasilar opacities/atelectasis Electronically Signed   By: Margarette Canada M.D.   On: 10/16/2018 18:28     Medications:     Scheduled Medications: . aspirin  81 mg Per Tube Daily  . atorvastatin  40 mg Oral q1800  . chlorhexidine gluconate (MEDLINE KIT)  15 mL Mouth Rinse BID  . Chlorhexidine Gluconate Cloth  6 each Topical Daily  . clonazePAM  0.5 mg Per Tube BID  . feeding supplement (PRO-STAT SUGAR FREE 64)  30 mL Per Tube QID  . feeding supplement (VITAL 1.5 CAL)  1,000 mL Per Tube Q24H  . hydrocortisone sodium succinate  50 mg Intravenous Q6H  . insulin aspart  0-20 Units Subcutaneous Q4H  . insulin aspart  10 Units Subcutaneous Q4H  . insulin detemir  30 Units Subcutaneous BID  . mouth rinse  15 mL Mouth Rinse 10 times per day  . midodrine  15 mg Oral TID WC  . phosphorus  125 mg Per Tube BID  . polyethylene glycol  17 g Oral Daily  . QUEtiapine  50 mg Oral BID  . sodium chloride flush  10-40 mL Intracatheter Q12H    Infusions: .  prismasol BGK 4/2.5 500 mL/hr at 10/17/18 0325  .  prismasol BGK 4/2.5 300 mL/hr at 10/17/18 0545  . sodium chloride 10 mL/hr at 10/17/18 0800  . DOPamine 5 mcg/kg/min (10/17/18 0800)  . famotidine (PEPCID) IV 20 mg (10/16/18 1300)  . heparin 1,200 Units/hr (10/17/18  0800)  . norepinephrine (LEVOPHED) Adult infusion 3 mcg/min (10/17/18 0800)  . prismasol BGK 4/2.5 1,500 mL/hr at 10/17/18 0726  . sodium chloride 999 mL/hr at 10/06/18 1018    PRN Medications: sodium chloride,  acetaminophen (TYLENOL) oral liquid 160 mg/5 mL, bisacodyl, docusate, heparin, midazolam, ondansetron, sodium chloride, sodium chloride flush   Assessment/Plan:   1. PEA arrest on 5/30 - due to hypoxia/pulmonary edema. - now stable  2. Acute on chronic hypoxic respiratory failure - has underlying  OHS/OSA  - intubated on 5/30 in setting of arrest. extubated 6/3.reintubated 6/11 with worsening RUL aispace disease.  - treated for MSSA PNA with cefipime/vanc.  - s/p trach 6/19. Tolerating well but still on vent. Continue to wean. She is very weak.  - Management per CCM   3.  Acute on chronic diastolic HF - Admitted with markedly elevated filling pressures complicated by severe cardiorenal syndrome - Now down 80+ pounds with CVVHD. - Volume management per Renal and CVVHD. Now appears euvolemic - ? Need to w/u for possible TTR amyloid  4. Pulmonary HTN, severe  - this is mostly pulmonary venous HTN by cath [facility] 2) but also has a component of OHS/OSA (WHO group 3). Improved with volume removal.  - no role for selective pulmonary vasodilators  5. AKI on CKD 3 - baseline creatinine 1.9 -> ESRD - Remains on CVVHD. Renal managing - Hopefully we can get on iHD once off NE. Will increase midodrine to 15 tid to help facilitate  6. Morbid obesity - needs weight loss  6. Chronic AF/AFL with slow VR and symptomatic bradycardia - Off apixaban. Now on heparin. No bleeding. Discussed dosing with PharmD personally. - Started on dopamine for sustained rates in 30s last night. VR back up to 40-50 range - Ideally would probably benefit from PPM but poor candidate given co-morbidities and recent HD start. Will d/w EP - Will likely need permanent HD access placed so will not start warfarin yet   7. VT/NSVT - Quiescent.  off amio (for 2 weeks) due to bradycardia - Keep K> 4.0 Mg 2.0  8. MSSA PNA - Bcx drawn 6/2 & 6/11. NGTD - sputum cx with MSSA PNA -Treated with cefipime/vanc - course  completed  9. Hypotension - Persists - Now on midodrine 15 tid and NE 3 - Continue to wean NE as tolerated.  - This has been a major barrier   CRITICAL CARE Performed by: Glori Bickers  Total critical care time: 35 minutes  Critical care time was exclusive of separately billable procedures and treating other patients.  Critical care was necessary to treat or prevent imminent or life-threatening deterioration.  Critical care was time spent personally by me (independent of midlevel providers or residents) on the following activities: development of treatment plan with patient and/or surrogate as well as nursing, discussions with consultants, evaluation of patient's response to treatment, examination of patient, obtaining history from patient or surrogate, ordering and performing treatments and interventions, ordering and review of laboratory studies, ordering and review of radiographic studies, pulse oximetry and re-evaluation of patient's condition.     Length of Stay: 25  Glori Bickers MD 10/17/2018, 8:40 AM  Advanced Heart Failure Team Pager [phone] (M-F; [place])  Please contact [facility] for night-coverage after hours (4p -7a ) and weekends on [email or web address]

## 2018-10-17 NOTE — Consult Note (Addendum)
ELECTROPHYSIOLOGY CONSULT NOTE    Patient ID: Kathleen [name] MRN: [id number], DOB/AGE: 67/20/1952 67 y.o.  Admit date: 09/22/2018 Date of Consult: 10/17/2018  Primary Physician: Aldine Contes, MD Primary Cardiologist: Fransico Him, MD  Electrophysiologist: Dr. Caryl Comes  Patient Profile: Kathleen [name] is a 68 y.o. female with a history of permanent Afib, HTN, super morbid obesity, DM2, dyslipidemia, OHS, CKD stage IV,  who is being seen today for the evaluation of AF with slow VR at the request of Dr. Haroldine Laws .  HPI:  Kathleen [name] is a 68 y.o. female admitted with progressive edema and HF symptoms. Weight 301 lbs on admission. RHC showed markedly elevated biventricular filling pressures with normal cardiac output (thought overestimated due to weight). Pt had PEA arrest on 5/30 2/2 hypoxia pulmonary edema. Intubated. She was extubated and subsequently re-intubated 6/11 with worsening RUL airspace disease. Treated for MSSA PNA with vanc/cefipime. Underwent trach 6/19. Pt also required initiation of CVVHD this admission. Down 85 lbs so far with CVVHD. Pt with h/o Chronic AF/AFL with controlled to slow rates this admit.      Pt had slow ventricular rates into the upper 30s overnight requiring initation of dopamine and prompting EP consultation.   Pt awake on vent through trach and follows commands. Her pressures drop significantly with attempts to sit up despite midodrine. Denies SOB.   Past Medical History:  Diagnosis Date   Atrial flutter ([place])    a. permanent.   Bradycardia    Chronic diastolic (congestive) heart failure ([place]) 01/10/2015   CKD (chronic kidney disease), stage IV ([facility])    Degenerative joint disease of hand    Diabetes mellitus    Dyslipidemia    Fecal occult blood test positive    GERD (gastroesophageal reflux disease)    Headache    Hypertension    Inadequate material resources    Irritable bowel syndrome    Morbid obesity ([facility])    Obesity  hypoventilation syndrome ([facility])    Post-menopausal bleeding    Shortness of breath dyspnea    multifactorial from obesity, deconditioning, obesity hypoventilation syndrome     Surgical History:  Past Surgical History:  Procedure Laterality Date   CHOLECYSTECTOMY     EYE SURGERY     left lens implant s/p cataracts   IR FLUORO GUIDE CV LINE RIGHT  10/03/2018   IR US GUIDE VASC ACCESS RIGHT  10/03/2018   OTHER SURGICAL HISTORY     right shoulder tendon repair 05/2011   RIGHT HEART CATH N/A 09/23/2018   Procedure: RIGHT HEART CATH;  Surgeon: Jolaine Artist, MD;  Location: [facility];  Service: Cardiovascular;  Laterality: N/A;    Medications Prior to Admission  Medication Sig Dispense Refill Last Dose   apixaban (ELIQUIS) 5 MG TABS tablet Take 1 tablet (5 mg total) by mouth 2 (two) times daily. 180 tablet 2    atorvastatin (LIPITOR) 40 MG tablet Take 1 tablet (40 mg total) by mouth daily. 90 tablet 3    gabapentin (NEURONTIN) 300 MG capsule TAKE 1 CAPSULE EVERY MORNING, TAKE 1 CAPSULE IN THE AFTERNOON, AND TAKE 2 CAPSULES EVERY NIGHT (Patient taking differently: Take 300 mg by mouth. TAKE 300 mg CAPSULE EVERY MORNING, TAKE 300 mg CAPSULE IN THE AFTERNOON, AND TAKE 600 mg  EVERY NIGHT) 360 capsule 3    insulin detemir (LEVEMIR) 100 UNIT/ML injection Inject 65 units total into the skin once daily. 30 mL 5    liraglutide (VICTOZA) 18 MG/3ML  SOPN Inject 0.3 mLs (1.8 mg total) into the skin daily. 9 pen 3    metolazone (ZAROXOLYN) 5 MG tablet Take 5 mg by mouth daily.      omeprazole (PRILOSEC) 20 MG capsule Take 20 mg by mouth daily.      POTASSIUM CHLORIDE CRYS ER PO Take 20 mEq by mouth daily. For 10 days      torsemide (DEMADEX) 20 MG tablet Take 20 mg by mouth 2 (two) times daily.      ACCU-CHEK AVIVA PLUS test strip TEST BLOOD SUGAR THREE TIMES DAILY (Patient taking differently: 1 each by Other route 3 (three) times daily. ) 100 each 2    Accu-Chek FastClix  Lancets MISC TEST BLOOD SUGAR THREE TIMES DAILY 306 each 1    Accu-Chek Softclix Lancets lancets TEST BLOOD SUGAR THREE TIMES DAILY (Patient taking differently: 1 each 3 (three) times daily. ) 300 each 1    Blood Glucose Monitoring Suppl (ACCU-CHEK AVIVA PLUS) w/Device KIT TEST BLOOD SUGAR THREE TIMES DAILY 1 kit 0    Insulin Pen Needle (B-D UF III MINI PEN NEEDLES) 31G X 5 MM MISC USE ONE TIME DAILY TO INJECT VICTOZA. DX:E11.9 100 each 4    Insulin Syringe-Needle U-100 (BD VEO INSULIN SYRINGE U/F) 31G X 15/64" 1 ML MISC USE TO INJECT INSULIN ONE TIME A DAY 100 each 5    metFORMIN (GLUCOPHAGE) 500 MG tablet Take 1 tablet (500 mg total) by mouth 2 (two) times daily with a meal. 180 tablet 1    Multiple Vitamins-Minerals (HEALTHY EYES PO) Take 1 tablet by mouth daily. Reported on 05/17/2015      NON FORMULARY Place 2 L into the nose daily. Wears with exertion and qhs       Inpatient Medications:   aspirin  81 mg Per Tube Daily   atorvastatin  40 mg Oral q1800   chlorhexidine gluconate (MEDLINE KIT)  15 mL Mouth Rinse BID   Chlorhexidine Gluconate Cloth  6 each Topical Daily   clonazePAM  0.5 mg Per Tube BID   feeding supplement (PRO-STAT SUGAR FREE 64)  30 mL Per Tube QID   feeding supplement (VITAL 1.5 CAL)  1,000 mL Per Tube Q24H   hydrocortisone sodium succinate  50 mg Intravenous Q6H   insulin aspart  0-20 Units Subcutaneous Q4H   insulin aspart  10 Units Subcutaneous Q4H   insulin detemir  30 Units Subcutaneous BID   mouth rinse  15 mL Mouth Rinse 10 times per day   midodrine  15 mg Oral TID WC   phosphorus  125 mg Per Tube BID   polyethylene glycol  17 g Oral Daily   QUEtiapine  50 mg Oral BID   sodium chloride flush  10-40 mL Intracatheter Q12H   Allergies:  Allergies  Allergen Reactions   Doxycycline     REACTION: Wheals and pruritus   Social History   Socioeconomic History   Marital status: Divorced    Spouse name: Not on file   Number of  children: 2   Years of education: Not on file   Highest education level: Not on file  Occupational History   Occupation: unemployed    Fish farm manager: UNEMPLOYED  Social Designer, fashion/clothing strain: Not on file   Food insecurity    Worry: Not on file    Inability: Not on file   Transportation needs    Medical: Not on file    Non-medical: Not on file  Tobacco Use  Smoking status: Never Smoker   Smokeless tobacco: Never Used  Substance and Sexual Activity   Alcohol use: No    Alcohol/week: 0.0 standard drinks   Drug use: No   Sexual activity: Not Currently  Lifestyle   Physical activity    Days per week: Not on file    Minutes per session: Not on file   Stress: Not on file  Relationships   Social connections    Talks on phone: Not on file    Gets together: Not on file    Attends religious service: Not on file    Active member of club or organization: Not on file    Attends meetings of clubs or organizations: Not on file    Relationship status: Not on file   Intimate partner violence    Fear of current or ex partner: Not on file    Emotionally abused: Not on file    Physically abused: Not on file    Forced sexual activity: Not on file  Other Topics Concern   Not on file  Social History Narrative   Single.   Divorced x2.   Has 2 children, by two different fathers.   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 May apply for disability/SSI given her pain in her hands which has made interviewing for jobs difficult.   Never Smoked   Alcohol use-no   Drug use-no   Regular exercise-yes      Financial assistance approved for 100% discount at [facility] and has [facility] card per [employer]   02/18/2010             Family History  Problem Relation Age of Onset   Diabetes Mother    Obesity Mother    Stroke Father    Diabetes Sister    Colon cancer Neg Hx     Review of Systems: All  other systems reviewed and are otherwise negative except as noted above.  Physical Exam: Vitals:   10/17/18 0700 10/17/18 0800 10/17/18 0802 10/17/18 0900  BP: (!) 105/50 (!) 106/52  (!) 117/45  Pulse: (!) 57 (!) 56 (!) 55 (!) 52  Resp: (!) 22 18 (!) 21 19  Temp:   98.4 F (36.9 C)   TempSrc:   Oral   SpO2: 99% 99% 99% 99%  Weight:      Height:       GEN- Chronically ill appearing, awake on vent through trach.  HEENT: normocephalic, atraumatic; sclera clear, conjunctiva pink; hearing intact; oropharynx clear; neck supple Lungs- Mechanical breathing sounds.  Heart- Irregular, brady.  GI- Obese, soft, non-tender, non-distended  Extremities- no clubbing or cyanosis. Trace edema + boots.  MS- no significant deformity or atrophy Skin- warm and dry, no rash or lesion Psych- euthymic mood, Affect normal  Labs:   Lab Results  Component Value Date   WBC 36.2 (H) 10/17/2018   HGB 9.8 (L) 10/17/2018   HCT 34.4 (L) 10/17/2018   MCV 93.7 10/17/2018   PLT 141 (L) 10/17/2018    Recent Labs  Lab 10/13/18 0259  10/17/18 0340  NA  --    < > 132*  K  --    < > 4.2  CL  --    < > 103  CO2  --    < > 23  BUN  --    < > 25*  CREATININE  --    < >  1.13*  CALCIUM  --    < > 7.5*  PROT 7.5  --   --   BILITOT 1.6*  --   --   ALKPHOS 88  --   --   ALT 23  --   --   AST 38  --   --   GLUCOSE  --    < > 230*   < > = values in this interval not displayed.    Radiology/Studies: US Renal  Result Date: 09/22/2018 CLINICAL DATA:  Renal insufficiency. EXAM: RENAL / URINARY TRACT ULTRASOUND COMPLETE COMPARISON:  None. FINDINGS: Right Kidney: Renal measurements: 11.6 x 4.7 x 4.8 cm = volume: 136.1 mL. Increased renal echogenicity but relatively normal renal cortical thickness and no focal lesions or hydronephrosis. Left Kidney: Renal measurements: 11.2 x 5.6 x 5.1 cm = volume: 166.8 mL. Increased renal echogenicity but relatively normal renal cortical thickness and no focal lesions or  hydronephrosis. Bladder: Nondistended bladder.  Patient voided before the exam. IMPRESSION: Increased renal echogenicity suggesting medical renal disease but no focal renal lesions, hydronephrosis or renal calculi. Nondistended bladder. Electronically Signed   By: Marijo Sanes M.D.   On: 09/22/2018 19:11   Ir Fluoro Guide Cv Line Right  Result Date: 10/03/2018 INDICATION: ACUTE RENAL FAILURE EXAM: ULTRASOUND GUIDANCE FOR VASCULAR ACCESS RIGHT INTERNAL JUGULAR PERMANENT HEMODIALYSIS CATHETER Date:  10/03/2018 10/03/2018 3:54 pm Radiologist:  Jerilynn Mages. Daryll Brod, MD Guidance:  Ultrasound fluoroscopic FLUOROSCOPY TIME:  Fluoroscopy Time: 0 minutes 30 seconds (3 mGy). MEDICATIONS: Ancef 2 g administered within 1 the procedure ANESTHESIA/SEDATION: Versed 0.5 mg IV; Fentanyl 25 mcg IV; Moderate Sedation Time:  12 minutes The patient was continuously monitored during the procedure by the interventional radiology nurse under my direct supervision. CONTRAST:  None COMPLICATIONS: None immediate. PROCEDURE: Informed consent was obtained from the patient following explanation of the procedure, risks, benefits and alternatives. The patient understands, agrees and consents for the procedure. All questions were addressed. A time out was performed. Maximal barrier sterile technique utilized including caps, mask, sterile gowns, sterile gloves, large sterile drape, hand hygiene, and 2% chlorhexidine scrub. Under sterile conditions and local anesthesia, right internal jugular micropuncture venous access was performed with ultrasound. Images were obtained for documentation of the patent right internal jugular vein. A guide wire was inserted followed by a transitional dilator. Next, a 0.035 guidewire was advanced into the IVC with a 5-French catheter. Measurements were obtained from the right venotomy site to the proximal right atrium. In the right infraclavicular chest, a subcutaneous tunnel was created under sterile conditions and local  anesthesia. 1% lidocaine with epinephrine was utilized for this. The 23 cm tip to cuff palindrome catheter was tunneled subcutaneously to the venotomy site and inserted into the SVC/RA junction through a valved peel-away sheath. Position was confirmed with fluoroscopy. Images were obtained for documentation. Blood was aspirated from the catheter followed by saline and heparin flushes. The appropriate volume and strength of heparin was instilled in each lumen. Caps were applied. The catheter was secured at the tunnel site with Gelfoam and a pursestring suture. The venotomy site was closed with subcuticular Vicryl suture. Dermabond was applied to the small right neck incision. A dry sterile dressing was applied. The catheter is ready for use. No immediate complications. IMPRESSION: Ultrasound and fluoroscopically guided right internal jugular tunneled hemodialysis catheter (23 cm tip to cuff palindrome catheter). Electronically Signed   By: Jerilynn Mages.  Shick M.D.   On: 10/03/2018 15:59   Ir US Guide Vasc Access  Right  Result Date: 10/03/2018 INDICATION: ACUTE RENAL FAILURE EXAM: ULTRASOUND GUIDANCE FOR VASCULAR ACCESS RIGHT INTERNAL JUGULAR PERMANENT HEMODIALYSIS CATHETER Date:  10/03/2018 10/03/2018 3:54 pm Radiologist:  Jerilynn Mages. Daryll Brod, MD Guidance:  Ultrasound fluoroscopic FLUOROSCOPY TIME:  Fluoroscopy Time: 0 minutes 30 seconds (3 mGy). MEDICATIONS: Ancef 2 g administered within 1 the procedure ANESTHESIA/SEDATION: Versed 0.5 mg IV; Fentanyl 25 mcg IV; Moderate Sedation Time:  12 minutes The patient was continuously monitored during the procedure by the interventional radiology nurse under my direct supervision. CONTRAST:  None COMPLICATIONS: None immediate. PROCEDURE: Informed consent was obtained from the patient following explanation of the procedure, risks, benefits and alternatives. The patient understands, agrees and consents for the procedure. All questions were addressed. A time out was performed. Maximal barrier  sterile technique utilized including caps, mask, sterile gowns, sterile gloves, large sterile drape, hand hygiene, and 2% chlorhexidine scrub. Under sterile conditions and local anesthesia, right internal jugular micropuncture venous access was performed with ultrasound. Images were obtained for documentation of the patent right internal jugular vein. A guide wire was inserted followed by a transitional dilator. Next, a 0.035 guidewire was advanced into the IVC with a 5-French catheter. Measurements were obtained from the right venotomy site to the proximal right atrium. In the right infraclavicular chest, a subcutaneous tunnel was created under sterile conditions and local anesthesia. 1% lidocaine with epinephrine was utilized for this. The 23 cm tip to cuff palindrome catheter was tunneled subcutaneously to the venotomy site and inserted into the SVC/RA junction through a valved peel-away sheath. Position was confirmed with fluoroscopy. Images were obtained for documentation. Blood was aspirated from the catheter followed by saline and heparin flushes. The appropriate volume and strength of heparin was instilled in each lumen. Caps were applied. The catheter was secured at the tunnel site with Gelfoam and a pursestring suture. The venotomy site was closed with subcuticular Vicryl suture. Dermabond was applied to the small right neck incision. A dry sterile dressing was applied. The catheter is ready for use. No immediate complications. IMPRESSION: Ultrasound and fluoroscopically guided right internal jugular tunneled hemodialysis catheter (23 cm tip to cuff palindrome catheter). Electronically Signed   By: Jerilynn Mages.  Shick M.D.   On: 10/03/2018 15:59   Dg Chest Port 1 View  Result Date: 10/16/2018 CLINICAL DATA:  Shortness of breath and pulmonary edema EXAM: PORTABLE CHEST 1 VIEW COMPARISON:  10/14/2018 and prior radiographs FINDINGS: Tracheostomy tube, small bore feeding tube, RIGHT IJ central venous catheter with  tip overlying the SUPERIOR cavoatrial junction and LEFT PICC line with tip overlying the UPPER SVC again noted. Interstitial opacities/edema bibasilar opacities/atelectasis are not significantly changed. No pneumothorax. IMPRESSION: Unchanged appearance of the chest with bilateral interstitial opacities/edema bibasilar opacities/atelectasis Electronically Signed   By: Margarette Canada M.D.   On: 10/16/2018 18:28   Dg Chest Port 1 View  Result Date: 10/14/2018 CLINICAL DATA:  68 year old female with tracheostomy tube placement EXAM: PORTABLE CHEST 1 VIEW COMPARISON:  10/13/2018, 10/12/2018, 10/11/2018 FINDINGS: Cardiomediastinal silhouette unchanged in size and contour. Low lung volumes accentuate the interstitium and the vasculature. Interval removal of endotracheal tube in placement of tracheostomy which terminates at the level of the clavicular heads in the midline. Unchanged position of left upper extremity PICC with the catheter terminating in the superior vena cava. Unchanged right IJ central venous catheter with the tip appearing to terminate at the superior cavoatrial junction. Enteric feeding tube terminates out of the field of view. Reticular opacities at the lung bases with no  interlobular septal thickening. No pneumothorax. IMPRESSION: Interval placement of tracheostomy tube and removal of endotracheal tube. Low lung volumes with likely atelectasis/consolidation. Unchanged right IJ hemodialysis catheter, left upper extremity PICC, and enteric feeding tube. Electronically Signed   By: Corrie Mckusick D.O.   On: 10/14/2018 12:37   Dg Chest Port 1 View  Result Date: 10/13/2018 CLINICAL DATA:  ETT present ,resp failure EXAM: PORTABLE CHEST 1 VIEW COMPARISON:  Chest x-rays dated 10/12/2018, 10/11/2018 and 10/09/2018. FINDINGS: Mild cardiomegaly is stable. Overall cardiomediastinal silhouette is stable. Endotracheal tube is well positioned with tip just above the level of the carina. Enteric tube passes below  the diaphragm. RIGHT subclavian central line is stable in position with tip at the level of the mid SVC. Continued central pulmonary vascular congestion and mild bilateral interstitial edema, unchanged compared to yesterday's exam, improved compared to the earlier study of 10/09/2018. Probable small LEFT pleural effusion. No pneumothorax seen. IMPRESSION: 1. Endotracheal tube well positioned with tip just above the level of the carina. 2. Continued central pulmonary vascular congestion and mild bilateral interstitial edema, unchanged compared to yesterday's exam, improved compared to yesterday's exam. 3. Probable small LEFT pleural effusion. Electronically Signed   By: Franki Cabot M.D.   On: 10/13/2018 08:33   Dg Chest Port 1 View  Result Date: 10/12/2018 CLINICAL DATA:  Respiratory failure, ventilatory support EXAM: PORTABLE CHEST 1 VIEW COMPARISON:  10/11/2018 FINDINGS: Endotracheal tube 5.2 cm above the carina. Feeding tube enters the stomach with the tip not visualized on the film. Right IJ dialysis catheter tip mid SVC level. Left upper extremity PICC line tip proximal SVC level. Minimal improvement in the diffuse airspace process versus edema. Patchy right mid lung and bibasilar opacities persist concerning for residual pneumonia. No large effusion or pneumothorax. Heart remains enlarged. IMPRESSION: Slight improvement in bilateral asymmetric airspace process versus edema. Stable support apparatus Electronically Signed   By: Jerilynn Mages.  Shick M.D.   On: 10/12/2018 08:14   Dg Chest Port 1 View  Result Date: 10/11/2018 CLINICAL DATA:  Respiratory failure.  Intubated patient. EXAM: PORTABLE CHEST 1 VIEW COMPARISON:  Single-view of the chest 10/10/2018 and 10/09/2018. FINDINGS: Support tubes and lines are unchanged. Extensive bilateral airspace disease persists but has improved over the past 2 days. Heart size is enlarged. No pneumothorax. Small left pleural effusion noted. No acute bony abnormality. IMPRESSION:  Support tubes and lines are unchanged and project in good position. Improved bilateral airspace disease. Electronically Signed   By: Inge Rise M.D.   On: 10/11/2018 08:26   Dg Chest Port 1 View  Result Date: 10/10/2018 CLINICAL DATA:  ETT present,resp failure EXAM: PORTABLE CHEST - 1 VIEW COMPARISON:  10/09/2018 FINDINGS: Endotracheal tube, feeding tube, left IJ central venous catheter, and tunneled right IJ hemodialysis catheter stable in position. Bilateral patchy interstitial and airspace opacities with a predominantly perihilar and left lower lung distribution as before. Heart size and mediastinal contours are within normal limits. No effusion. No pneumothorax. Visualized bones unremarkable. IMPRESSION: 1. Stable asymmetric infiltrates or edema. 2. Support hardware stable in position. Electronically Signed   By: Lucrezia Europe M.D.   On: 10/10/2018 07:52   Dg Chest Port 1 View  Result Date: 10/09/2018 CLINICAL DATA:  Patient intubated. EXAM: PORTABLE CHEST 1 VIEW COMPARISON:  October 08, 2018 FINDINGS: The ETT is in good position. The feeding tube terminates below today's film. Other support apparatus including a right central line and a left PICC line are stable. No pneumothorax. Diffuse bilateral pulmonary  infiltrates remain, similar to slightly improved in the interval. No change in the cardiomediastinal silhouette. IMPRESSION: 1. Support apparatus as above, in good position. 2. Diffuse bilateral pulmonary infiltrates remain, similar to slightly improved in the interval. Electronically Signed   By: Dorise Bullion III M.D   On: 10/09/2018 08:05   Dg Chest Port 1 View  Result Date: 10/08/2018 CLINICAL DATA:  Acute respiratory failure with hypoxia. EXAM: PORTABLE CHEST 1 VIEW COMPARISON:  10/07/2018 FINDINGS: Endotracheal tube terminates at the inferior margin of the clavicular heads. Right jugular catheter, left PICC, and enteric tube remain in place. The cardiac silhouette remains enlarged.  Extensive patchy and confluent airspace opacities bilaterally, left mildly worse than right, are unchanged aside from mild improvement in the right midlung. No large pleural effusion or pneumothorax is identified. IMPRESSION: Extensive bilateral airspace disease, slightly improved on the right. Electronically Signed   By: Logan Bores M.D.   On: 10/08/2018 10:16   Dg Chest Port 1 View  Result Date: 10/07/2018 CLINICAL DATA:  Respiratory failure EXAM: PORTABLE CHEST 1 VIEW COMPARISON:  10/06/2018 FINDINGS: Cardiac shadow remains enlarged. Endotracheal tube, feeding catheter and dialysis catheter are again seen and stable. Left PICC line is noted as well. Patchy bilateral infiltrates are noted slightly improved in the right upper lobe but overall increased in the left lung when compared with the prior exam. No sizable effusion is noted. IMPRESSION: Waxing and waning infiltrates bilaterally slightly improved on the right but worsened on the left. Tubes and lines as described above. Electronically Signed   By: Inez Catalina M.D.   On: 10/07/2018 07:26   Dg Chest Port 1 View  Result Date: 10/06/2018 CLINICAL DATA:  Endotracheal tube adjustment. EXAM: PORTABLE CHEST 1 VIEW COMPARISON:  10/06/2018 FINDINGS: Endotracheal tube has been pulled back and has tip 2.8 cm above the carina. Enteric tube courses into the region of the stomach and off the film as tip is not visualized. Left-sided PICC line and right IJ central venous catheter unchanged. Lungs are adequately inflated demonstrate stable airspace consolidation over the right upper lobe with mild patchy left perihilar and right basilar opacification. No effusion. No pneumothorax. Mild stable cardiomegaly. Remainder of the exam is unchanged. IMPRESSION: Multifocal airspace process worse over the right upper lobe without significant change likely multifocal pneumonia. Tubes and lines as described. Endotracheal tube has been pulled back with tip 2.8 cm above the  carina. Electronically Signed   By: Marin Olp M.D.   On: 10/06/2018 08:44   Portable Chest X-ray  Result Date: 10/06/2018 CLINICAL DATA:  Endotracheal tube placement.  Respiratory failure. EXAM: PORTABLE CHEST 1 VIEW COMPARISON:  One-view chest x-ray 10/06/2018 at 3:40 a.m. FINDINGS: The patient has now been intubated. The endotracheal tube terminates at the level the carina, directed towards the right mainstem bronchus. Progressive right upper lobe airspace consolidation is present. Aeration at the right base and left lung is improved. Small bore feeding tube extends off the inferior border the film. Right IJ line is stable. IMPRESSION: 1. Endotracheal tube terminates at the level the carina and could be pulled back approximately 3 cm for more optimal positioning. 2. Increased right upper lobe airspace consolidation. 3. Improved aeration of the left lung and right lower lobe. These results were called by telephone at the time of interpretation on 10/06/2018 at 7:50am to Dr. Nelda Marseille, who verbally acknowledged these results. Electronically Signed   By: San Morelle M.D.   On: 10/06/2018 08:07   Dg Chest [facility]  Result Date: 10/06/2018 CLINICAL DATA:  Fever and hypoxia. EXAM: PORTABLE CHEST 1 VIEW COMPARISON:  One-view chest x-ray 10/02/2018 FINDINGS: The heart is enlarged. New right IJ catheter is in place. Feeding tube courses off the inferior border the film. Left-sided PICC line terminates near the azygos. There is increase in a diffuse interstitial and airspace pattern. No significant effusions are present. IMPRESSION: 1. Increasing diffuse interstitial and airspace disease consistent with edema or ARDS. Infection is not excluded. 2. New right IJ catheter without pneumothorax. Electronically Signed   By: San Morelle M.D.   On: 10/06/2018 04:56   Dg Chest Port 1 View  Result Date: 10/02/2018 CLINICAL DATA:  Respiratory failure EXAM: PORTABLE CHEST 1 VIEW COMPARISON:  Chest  radiograph 09/29/2018 FINDINGS: Right IJ central venous catheter tip projects over the superior vena cava. Enteric tube courses inferior to the diaphragm. Left upper extremity PICC line tip projects over the superior vena cava. Monitoring leads overlie the patient. Stable cardiomegaly. Similar-appearing diffuse bilateral heterogeneous pulmonary opacities. No definite pleural effusion or pneumothorax. IMPRESSION: Cardiomegaly and pulmonary edema. Electronically Signed   By: Lovey Newcomer M.D.   On: 10/02/2018 08:13   Dg Chest Port 1 View  Result Date: 09/29/2018 CLINICAL DATA:  Respiratory failure EXAM: PORTABLE CHEST 1 VIEW COMPARISON:  09/28/2018 FINDINGS: Endotracheal tube and gastric catheter have been removed in the interval. Right jugular central line and left-sided PICC line are again seen and stable. Heart is enlarged in size but stable. Increasing vascular congestion is noted with interstitial edema. Some patchy density is noted which may represent some overlying atelectasis. This is noted in the mid and lower lung fields on the right. IMPRESSION: Increasing vascular congestion/edema and right-sided atelectatic changes. Electronically Signed   By: Inez Catalina M.D.   On: 09/29/2018 07:13   Dg Chest Port 1 View  Result Date: 09/28/2018 CLINICAL DATA:  Respiratory failure EXAM: PORTABLE CHEST 1 VIEW COMPARISON:  09/27/2018 FINDINGS: Endotracheal tube, gastric catheter and right jugular central line are again seen. Left-sided PICC line is noted at the junction of the innominate veins. Persistent but improved vascular congestion is noted. No new focal infiltrate is seen. IMPRESSION: Improvement in the degree of pulmonary edema. No new focal abnormality is seen. Electronically Signed   By: Inez Catalina M.D.   On: 09/28/2018 08:14   Dg Chest Port 1 View  Result Date: 09/27/2018 CLINICAL DATA:  Check endotracheal tube placement EXAM: PORTABLE CHEST 1 VIEW COMPARISON:  09/26/2018 FINDINGS: Cardiac shadow  remains enlarged. Endotracheal tube, gastric catheter and right jugular central line are noted in satisfactory position. Left-sided PICC line is noted as well. Diffuse vascular congestion is again seen with some increase in the degree of pulmonary edema when compare with the prior exam. No bony abnormality is noted. IMPRESSION: Increase in the degree of pulmonary edema. Electronically Signed   By: Inez Catalina M.D.   On: 09/27/2018 07:45   Dg Chest Port 1 View  Result Date: 09/26/2018 CLINICAL DATA:  Check endotracheal tube placement EXAM: PORTABLE CHEST 1 VIEW COMPARISON:  09/24/2018 FINDINGS: Endotracheal tube, gastric catheter and right jugular central lines are again seen and stable. Left-sided PICC line is noted at the confluence of the innominate veins. Cardiac shadow is mildly enlarged but accentuated by the frontal technique. Vascular congestion and edema is again identified stable in appearance from the prior exam. No new focal infiltrate is seen. IMPRESSION: Tubes and lines as described above. Stable vascular congestion and pulmonary edema. Electronically Signed  By: Inez Catalina M.D.   On: 09/26/2018 07:15   Dg Chest Port 1 View  Result Date: 09/24/2018 CLINICAL DATA:  Central line placement.  History of CHF. EXAM: PORTABLE CHEST 1 VIEW COMPARISON:  Sep 24, 2018 FINDINGS: A left PICC line terminates near the confluence of the brachiocephalic veins. The ETT is in good position. A new right central line terminates in the SVC. No pneumothorax. Increasing bilateral pulmonary opacities, more focal in the right. NG tube terminates below today's film. No other acute abnormalities. IMPRESSION: 1. The new right PICC line terminates in the SVC without pneumothorax. Other support apparatus as above. 2. Increasing bilateral pulmonary opacities, right greater than left. While I suspect a component of pulmonary edema, underlying infection on the right should be considered. Electronically Signed   By: Dorise Bullion III M.D   On: 09/24/2018 13:59   Dg Chest Port 1 View  Result Date: 09/24/2018 CLINICAL DATA:  Intubation EXAM: PORTABLE CHEST 1 VIEW COMPARISON:  09/23/2018 FINDINGS: Endotracheal tube terminates 3.5 cm above the carina. Cardiomegaly with increased interstitial markings, possibly reflecting interstitial edema, although more focal opacity in the right mid lung raises concern for possible infection/pneumonia. Possible small left pleural effusion. No pneumothorax. Defibrillator pads overlying the left hemithorax. Left subclavian venous catheter versus left arm PICC terminates in the distal left brachiocephalic vein. Enteric tube courses into the stomach. IMPRESSION: Endotracheal tube terminates 3.5 cm above the carina. Left subclavian venous catheter versus left arm PICC terminates the distal left brachiocephalic vein. Focal right upper lobe opacity raises concern for pneumonia, less likely asymmetric interstitial edema. Possible small left pleural effusion. Electronically Signed   By: Julian Hy M.D.   On: 09/24/2018 09:34   Dg Chest Port 1 View  Result Date: 09/23/2018 CLINICAL DATA:  Central line placement. EXAM: PORTABLE CHEST 1 VIEW COMPARISON:  09/13/2018. FINDINGS: Apical lordotic view obtained. Prominence of superior mediastinum most likely related to prominent great vessels and apical lordotic projection. Left sided PICC line noted with tip over superior vena cava. Cardiomegaly with diffuse bilateral pulmonary interstitial prominence. Findings suggest mild CHF. Pneumonitis cannot be excluded. Tiny bilateral pleural effusions cannot be excluded. No pneumothorax. IMPRESSION: 1.  Left PICC line noted over superior vena cava. 2. Cardiomegaly with mild bilateral interstitial prominence suggesting mild CHF. Pneumonitis cannot be excluded. Tiny bilateral pleural effusions cannot be excluded. Electronically Signed   By: Marcello Moores  Register   On: 09/23/2018 12:25   Dg Abd Portable 1v  Result  Date: 09/30/2018 CLINICAL DATA:  NG tube placement. EXAM: PORTABLE ABDOMEN - 1 VIEW COMPARISON:  None. FINDINGS: The NG tube tip is in the antropyloric region of the stomach. The bowel gas pattern is unremarkable. IMPRESSION: NG tube tip in the antropyloric region of the stomach. Electronically Signed   By: Marijo Sanes M.D.   On: 09/30/2018 13:14   [place] Ekg Site Rite  Result Date: 09/23/2018 If Site Rite image not attached, placement could not be confirmed due to current cardiac rhythm.  EKG: Today, Afib with junctional escape in 50s (personally reviewed)  TELEMETRY: AF 40-50s mostly, occasional down into upper 30s overnight (personally reviewed)  Assessment and Plan   1.  AF with slow VR - rates down into upper 30s. No AV blockade on board.  Eliquis on hold with procedures. Currently on heparin. She is currently poor candidate for device with multiple severe co-morbidities. EP will follow along. This patients CHA2DS2-VASc Score and unadjusted Ischemic Stroke Rate (% per year) is at  least 5.    2. Acute on chronic diastolic CHF / Pulmonary HTN, severe / PEA arrest - Currently on dopamine and s/p trach. Awake on Vent. Management per CHF team.   3. MSSA PNA - Per primary  4. AKI on CKD 3 -> ESRD - Now on CVVHD  5. Morbid obesity - Body mass index is 42.97 kg/m.   Dr. Lovena Le saw patient. Infection risk prohibitively high at this time.   For questions or updates, please contact [place] Please consult [email or web address] for contact info under Cardiology/STEMI.  Jacalyn Lefevre, PA-C  10/17/2018 9:30 AM   EP Attending  Patient seen and examined. Agree with above. The patient is a morbidly obese woman with mulitple medical problems admitted with respiratory failure and PEA arrest. She has been found to have MSSA pneumonia. She is s/p tracheostomy insertion. She has had relative bradycardia with her persistent atrial fib. She has ESRD on HD. Her exam is as noted above.   A/P 1. Atrial fib with a slow VR - the patient is a very poor candiate for PPM. Only option might be a Medtronic Micra but her overall prognosis seems very poor to me. We will follow along. Hopefully PPM can be avoided.   Mikle Bosworth.D.

## 2018-10-17 NOTE — Progress Notes (Signed)
Patient placed on 40% ATC at 1159 per MD. Patient tolerating well at this time. RT will continue to monitor.

## 2018-10-17 NOTE — Progress Notes (Signed)
Orthopedic Tech Progress Note Patient Details:  Kathleen [name] 04/15/1951 [id number]  Ortho Devices Type of Ortho Device: Ace wrap Ortho Device/Splint Location: Bilateral unna boots Ortho Device/Splint Interventions: Application   Post Interventions Patient Tolerated: Well Instructions Provided: Care of device   [name] 10/17/2018, 5:03 PM

## 2018-10-17 NOTE — Progress Notes (Signed)
NAME:  Kathleen [name], MRN:  [id number], DOB:  Jun 25, 1950, LOS: 25 ADMISSION DATE:  09/22/2018, CONSULTATION DATE: 09/24/2018 REFERRING MD: Candee Furbish MD, CHIEF COMPLAINT: Cardiac arrest  Brief History   68 y/o obese woman with chronic diastolic heart failure who failed outpatient diuretics management due to chronic kidney disease.  Right heart cath on 5/29 showed markedly elevated biventricular filling pressures with normal cardiac output.  She was on cardiology service, maintained on milrinone and Lasix.  On the morning of 5/30 she went into PEA arrest, CPR performed for 8 minutes.  Intubated and transferred to ICU.  Started on CRRT 5/31.  Extubated 6/3.  Re-intubated early am 6/11 for hypercarbic respiratory failure and underwent tracheostomy on 6/19  Past Medical History   has a past medical history of Atrial flutter ([place]), Bradycardia, Chronic diastolic (congestive) heart failure ([place]) (01/10/2015), CKD (chronic kidney disease), stage IV ([place]), Degenerative joint disease of hand, Diabetes mellitus, Dyslipidemia, Fecal occult blood test positive, GERD (gastroesophageal reflux disease), Headache, Hypertension, Inadequate material resources, Irritable bowel syndrome, Morbid obesity ([place]), Obesity hypoventilation syndrome ([place]), Post-menopausal bleeding, and Shortness of breath dyspnea.  Significant Hospital Events   5/28 Admit 5/29 RHC 5/30 PEA arrest, intubated/ CRRT 5/31 Milrinone stopped due to ectopy; brady episode- amio stopped 6/02 Attempt at SBT, chest x-ray with worsening pulmonary edema, CVVHD for volume removal 6/03 Extubated > remain on CRRT.  Refused nocturnal BiPAP 6/04 Refused nocturnal BiPAP 6/10 off CRRT 6/11 Intubated early am with hypercarbia, concern for RUL PNA; abx broadened; restarted on CRRT 6/16  No events overnight, weaning on high PS 6/17  failed vent wean, remains on levophed, midodrine and CRRT 6/18  Weaned for 5 hours on PSV 8/5 before requiring full support;  ongoing CRRT, remains on levophed 14 mcg 6/19 - TRACH + Dr Erskine Emery 6/20 -. On IV heparin gt, levophed gtt with midodrine. Off fent gtt. Pressopr needs down  Consults:  Cardiology PCCM  Nephrology   Procedures:  Lt PICC 5/29 >> OETT 5/30 >> 6/3; 6/11 > 6/19 - TRACH (Dr Erskine Emery) >> R IJ HD cath 5/30 >> Aline left ulnar 5/30 >> 6/2  Significant Diagnostic Tests:  TTE 5/28 >>The left ventricle has normal systolic function with an ejection fraction of 60-65%. . The right ventricle has normal systolic function. The cavity was mildly enlarged.   Right heart cath 5/29 RA = 24 RV = 94/26 PA = 95/36 (53) PCW = 30 (v = 50) Fick cardiac output/index = 6.0/2.7 PVR =3.9 WU FA sat = 91% PA sat = 58%, 58% SVC 60%  CXR 6/15> Bilateral ASD LLL > R. ETT, GT, LIJ and RIJ CVCs stable position.   Micro Data:  SARS coronavirus 2 cepheid 5/28 >> neg MRSA PCR 5/28 >> neg Trach aspirate 6/2 >> MSSA BCx2 6/2 >> negative ...................... Tracheal aspirate 6/11 >> few GNR >> few proteus and few candida tropicalis BCx2 6/11 >> negative  Antimicrobials:  Vancomycin 6/3 > 6/4 Cefazolin 6/4 >> 6/10 ...........................Marland Kitchen Vanco 6/11 >> 6/17 Cefepime 6/11 >>6/18  Interim history/subjective:   Was in slow atrial fibrillation 6/21 and became very bradycardic requiring dopamine Afebrile Remains on vent  Objective   Blood pressure (!) 107/47, pulse (!) 51, temperature 98.4 F (36.9 C), temperature source Oral, resp. rate (!) 21, height 5' (1.524 m), weight 99.8 kg, SpO2 100 %. CVP:  [6 mmHg-10 mmHg] 6 mmHg  Vent Mode: PSV;CPAP FiO2 (%):  [40 %] 40 % Set Rate:  [18 bmp] 18  bmp Vt Set:  [420 mL] 420 mL PEEP:  [5 cmH20] 5 cmH20 Pressure Support:  [5 cmH20-16 cmH20] 5 cmH20 Plateau Pressure:  [17 cmH20-23 cmH20] 17 cmH20   Intake/Output Summary (Last 24 hours) at 10/17/2018 1216 Last data filed at 10/17/2018 1200 Gross per 24 hour  Intake 2080.77 ml  Output 1383 ml  Net  697.77 ml   Filed Weights   10/15/18 1800 10/16/18 0500 10/17/18 0600  Weight: 100.3 kg 99.3 kg 99.8 kg    Chronically ill-appearing, elderly, no distress Anxious affect Mild pallor, no icterus, tracheostomy site clean Decreased breath sounds bilateral, minimal secretions,  S1-S2 regular, 50s on monitor Alert, interactive, follows commands although very weak and deconditioned Soft nontender abdomen 1+ edema   Chest x-ray 6/21 personally reviewed which shows bilateral unchanged interstitial opacities/edema   Resolved Hospital Problem list    Assessment & Plan:  ASSESSMENT / PLAN:  PULMONARY A:  Baseline hx - OSA/OHV - non compliant  Acute on chronic resp failure following cardiac arrest- prolonged mech vent and s/p trach 10/14/2018  P:   Spontaneous breathing trials with goal of trach collar  NEUROLOGIC A:   Acute Encephalopathy - resolved 6/19. Off fent gtt as of 10/15/2018   P:   Decrease Seroquel 50mg  nightly Versed prn Reduced klonopin from 1mg  bid to 0.5mg  bid, plan to taper further Evaluation ICU delirium precaution RN intervention  VASCULAR A:   Circulatory shock - ?if she is still dry, pressor requirement improved after fluid bolus yesterday   P:  Continue midodrone 15mg  tid Ongoing levophed Dopamine added for bradycardia sick/21 Taper stress dose steroids to off   CARDIAC A: PEA arrest : -suspect hypoxia driven Acute on chronic diastolic heart failure --admit with elevated filling pressures, cardiorenal syndrome  Pulmonary hypertension, severe : -s/p RHC, WHO class 2,3 Permanent AF/AFl - -on anticoagulation pre admit NSVT/VT - -off amio due to bradycardia  P: Per HF team  Heparin gtt  INFECTIOUS A:   MSSA PNA  6/2 Proteus VAP 6/11 P:   Completed cefepime 8 days 6/18 Afebrile but WBC rising, obtained procalcitonin which is low hence reassuring hence hold off on antibiotics and taper steroids  RENAL A:  Cardiorenal syndrome AKI/ CKD  3-4, remains anuric P:  CRRT per nephrology   ENDOCRINE A:   Morbid OBestity DM- ongoing hyperglycemia 6/18  P:   Levemir increased to 30 units BID TF coverage 10 units q 4 SSI resistant   HEMATOLOGIC A:  At risk anemia critical illness P:  - PRBC for hgb </= 6.9gm%      GI A:   Tolerting TF Constipation P:   Continue PPI  Ongoing scheduled bowel regimen miralax daily, last BM 6/20 Dulcolax  Hopefully will not need PEG here   MSK/DERM RT hip blister - 6/17 P  - skin dressing per RN, no issues per RN  Best practice:  Diet: NPO for trach, will resume TF per cortrak  Pain/Anxiety/Delirium protocol (if indicated):  In place  VAP protocol (if indicated): In place  DVT prophylaxis: Heparin gtt GI prophylaxis: Pepcid Glucose control: SSI Mobility: Bed Code Status: Full Family Communication: none Disposition: ICU   The patient is critically ill with multiple organ systems failure and requires high complexity decision making for assessment and support, frequent evaluation and titration of therapies, application of advanced monitoring technologies and extensive interpretation of multiple databases. Critical Care Time devoted to patient care services described in this note independent of APP/resident  time is 47  minutes.   Kara Mead MD. [place] Flood. [place] Pager [phone] If no response call [phone]     10/17/2018 12:16 PM

## 2018-10-17 NOTE — Progress Notes (Signed)
[place] for Heparin Indication: atrial fibrillation  Allergies  Allergen Reactions  . Doxycycline     REACTION: Wheals and pruritus    Patient Measurements: Height: 5' (152.4 cm) Weight: 220 lb 0.3 oz (99.8 kg) IBW/kg (Calculated) : 45.5  HEPARIN DW (KG): 71.8  Vital Signs: Temp: 98.4 F (36.9 C) (06/22 0802) Temp Source: Oral (06/22 0802) BP: 106/46 (06/22 1000) Pulse Rate: 52 (06/22 1000)  Labs: Recent Labs    10/15/18 0053  10/15/18 0107 10/15/18 0459 10/15/18 0254 10/15/18 1238  10/16/18 0518 10/16/18 1512 10/17/18 0340  HGB  --    < > 9.9*  --   --   --   --  9.9*  --  9.8*  HCT  --   --  32.6*  --   --   --   --  34.6*  --  34.4*  PLT  --   --  73*  --   --   --   --  146*  --  141*  HEPARINUNFRC  --   --   --  0.47  --   --   --  0.23*  --  0.33  CREATININE  --   --  1.42*  --  1.35*  --    < > 1.23* 1.11* 1.13*  TROPONINI 0.04*  --   --   --  0.04* 0.04*  --   --   --   --    < > = values in this interval not displayed.    Estimated Creatinine Clearance: 52 mL/min (A) (by C-G formula based on SCr of 1.13 mg/dL (H)).  Assessment: 68 y.o. female with h/o Afib, Eliquis on hold, for heparin. S/p trach placement.  No signs or symptoms of bleeding noted. Hgb 9.8, hct 24.4, platelets 141.   Goal of Therapy:  Heparin level 0.3-0.7 units/ml  Monitor platelets by anticoagulation protocol: Yes   Plan:  Continue Heparin 1200 units/hr Daily CBC + heparin level in AM.  Follow-up plans for transition back to oral anti-coagulation when able.   Tamela Gammon, PharmD 10/17/2018 10:10 AM PGY-1 Pharmacy Resident Direct Phone: [phone] Please check [email or web address] for unit-specific pharmacist phone numbers

## 2018-10-17 NOTE — Progress Notes (Signed)
[facility]    NEPHROLOGY PROGRESS NOTE  SUBJECTIVE: Patient seen and examined.  Denies chest pain, shortness of breath, nausea, vomiting, diarrhea or dysuria.  Chart reviewed extensively.  No acute events noted.  Discussed with the nurse at bedside.  Remains on a low-dose of Levophed.   OBJECTIVE:  Vitals:   10/17/18 1400 10/17/18 1450  BP:    Pulse: (!) 52 (!) 53  Resp: (!) 27 (!) 23  Temp:    SpO2: (!) 89% 97%    Intake/Output Summary (Last 24 hours) at 10/17/2018 1456 Last data filed at 10/17/2018 1400 Gross per 24 hour  Intake 2104.42 ml  Output 1457 ml  Net 647.42 ml      General: Sedated NAD HEENT: MMM [place] AT anicteric sclera Neck:  No JVD, no adenopathy, positive tracheostomy CV:  Heart RRR  Lungs:  L/S CTA bilaterally Abd:  abd SNT/ND with normal BS GU:  Bladder non-palpable Extremities: Trace bilateral LE edema. Skin:  No skin rash  MEDICATIONS:  . aspirin  81 mg Per Tube Daily  . atorvastatin  40 mg Oral q1800  . chlorhexidine gluconate (MEDLINE KIT)  15 mL Mouth Rinse BID  . Chlorhexidine Gluconate Cloth  6 each Topical Daily  . clonazePAM  0.5 mg Per Tube BID  . feeding supplement (PRO-STAT SUGAR FREE 64)  30 mL Per Tube QID  . feeding supplement (VITAL 1.5 CAL)  1,000 mL Per Tube Q24H  . insulin aspart  0-20 Units Subcutaneous Q4H  . insulin aspart  10 Units Subcutaneous Q4H  . insulin detemir  30 Units Subcutaneous BID  . mouth rinse  15 mL Mouth Rinse 10 times per day  . midodrine  15 mg Oral TID WC  . polyethylene glycol  17 g Oral Daily  . [START ON 10/18/2018] QUEtiapine  50 mg Oral QHS  . sodium chloride flush  10-40 mL Intracatheter Q12H       LABS:   CBC Latest Ref Rng & Units 10/17/2018 10/16/2018 10/15/2018  WBC 4.0 - 10.5 K/uL 36.2(H) 37.8(H) 22.1(H)  Hemoglobin 12.0 - 15.0 g/dL 9.8(L) 9.9(L) 9.9(L)  Hematocrit 36.0 - 46.0 % 34.4(L) 34.6(L) 32.6(L)  Platelets 150 - 400 K/uL 141(L) 146(L) 73(L)    CMP Latest Ref Rng &  Units 10/17/2018 10/16/2018 10/16/2018  Glucose 70 - 99 mg/dL 230(H) 182(H) 258(H)  BUN 8 - 23 mg/dL 25(H) 28(H) 27(H)  Creatinine 0.44 - 1.00 mg/dL 1.13(H) 1.11(H) 1.23(H)  Sodium 135 - 145 mmol/L 132(L) 134(L) 135  Potassium 3.5 - 5.1 mmol/L 4.2 4.6 4.3  Chloride 98 - 111 mmol/L 103 101 103  CO2 22 - 32 mmol/L _0 Calcium 8.9 - 10.3 mg/dL 7.5(L) 8.2(L) 7.8(L)  Total Protein 6.5 - 8.1 g/dL - - -  Total Bilirubin 0.3 - 1.2 mg/dL - - -  Alkaline Phos 38 - 126 U/L - - -  AST 15 - 41 U/L - - -  ALT 0 - 44 U/L - - -    Lab Results  Component Value Date   PTH 33 10/12/2018   CALCIUM 7.5 (L) 10/17/2018   CAION 1.18 10/12/2018   PHOS 2.5 10/17/2018       Component Value Date/Time   COLORURINE AMBER (A) 05/22/2013 1826   APPEARANCEUR HAZY (A) 05/22/2013 1826   LABSPEC 1.024 05/22/2013 1826   PHURINE 6.0 05/22/2013 1826   GLUCOSEU NEGATIVE 05/22/2013 1826   HGBUR NEGATIVE 05/22/2013 1826   BILIRUBINUR SMALL (A) 05/22/2013 1826  KETONESUR NEGATIVE 05/22/2013 1826   PROTEINUR 100 (A) 05/22/2013 1826   UROBILINOGEN 4.0 (H) 05/22/2013 1826   NITRITE NEGATIVE 05/22/2013 1826   LEUKOCYTESUR NEGATIVE 05/22/2013 1826      Component Value Date/Time   PHART 7.368 10/12/2018 0433   PCO2ART 42.7 10/12/2018 0433   PO2ART 126.0 (H) 10/12/2018 0433   HCO3 24.6 10/12/2018 0433   TCO2 26 10/12/2018 0433   ACIDBASEDEF 1.0 10/12/2018 0433   O2SAT 99.0 10/12/2018 0433       Component Value Date/Time   IRON 44 10/12/2018 1640   TIBC 379 10/12/2018 1640   IRONPCTSAT 12 10/12/2018 1640       ASSESSMENT/PLAN:    68 year old female patient with chronic diastolic heart failure who failed outpatient diuretic therapy underwent a right heart catheterization on 5/29 showing markedly elevated biventricular filling pressures and had a subsequent PEA arrest on 5/30.  She was intubated and ROSC was achieved.  She was started on CRRT on 5/31 for acute kidney injury.  She has had an ongoing pressor  requirement.  1.  Chronic kidney disease stage III with a baseline serum creatinine around 1.7-1.9.  I suspect her chronic kidney disease is on the basis of longstanding hypertension and diabetes.  Intact PTH 33 on 10/12/2018.  2.  Acute kidney injury.  Likely ATN in the setting of shock.  Has had no recovery of renal function yet.  Remains on CRRT.  Chest x-ray consistent with pulmonary congestion.  Continues on CRRT due to pressor requirement.   She has lost about 39 kg to date from CRRT.  Phosphorus stable on CRRT.  Will keep even on CRRT.  No sign of renal recovery yet.  3.  Chronic respiratory failure.  Status post tracheostomy today.  6.  Chronic diastolic heart failure.  Appears relatively euvolemic.     [place], DO, [place]

## 2018-10-18 LAB — POCT I-STAT 7, (LYTES, BLD GAS, ICA,H+H)
Acid-Base Excess: 1 mmol/L (ref 0.0–2.0)
Bicarbonate: 27 mmol/L (ref 20.0–28.0)
Calcium, Ion: 1.17 mmol/L (ref 1.15–1.40)
HCT: 37 % (ref 36.0–46.0)
Hemoglobin: 12.6 g/dL (ref 12.0–15.0)
O2 Saturation: 78 %
Patient temperature: 98.6
Potassium: 4.6 mmol/L (ref 3.5–5.1)
Sodium: 135 mmol/L (ref 135–145)
TCO2: 28 mmol/L (ref 22–32)
pCO2 arterial: 48.5 mmHg — ABNORMAL HIGH (ref 32.0–48.0)
pH, Arterial: 7.354 (ref 7.350–7.450)
pO2, Arterial: 45 mmHg — ABNORMAL LOW (ref 83.0–108.0)

## 2018-10-18 LAB — RENAL FUNCTION PANEL
Albumin: 1.7 g/dL — ABNORMAL LOW (ref 3.5–5.0)
Albumin: 1.8 g/dL — ABNORMAL LOW (ref 3.5–5.0)
Anion gap: 6 (ref 5–15)
Anion gap: 7 (ref 5–15)
BUN: 28 mg/dL — ABNORMAL HIGH (ref 8–23)
BUN: 25 mg/dL — ABNORMAL HIGH (ref 8–23)
CO2: 26 mmol/L (ref 22–32)
CO2: 27 mmol/L (ref 22–32)
Calcium: 7.8 mg/dL — ABNORMAL LOW (ref 8.9–10.3)
Calcium: 7.9 mg/dL — ABNORMAL LOW (ref 8.9–10.3)
Chloride: 101 mmol/L (ref 98–111)
Chloride: 101 mmol/L (ref 98–111)
Creatinine, Ser: 1.38 mg/dL — ABNORMAL HIGH (ref 0.44–1.00)
Creatinine, Ser: 1.39 mg/dL — ABNORMAL HIGH (ref 0.44–1.00)
GFR calc Af Amer: 46 mL/min — ABNORMAL LOW (ref >60)
GFR calc Af Amer: 46 mL/min — ABNORMAL LOW (ref >60)
GFR calc non Af Amer: 40 mL/min — ABNORMAL LOW (ref >60)
GFR calc non Af Amer: 39 mL/min — ABNORMAL LOW (ref >60)
Glucose, Bld: 197 mg/dL — ABNORMAL HIGH (ref 70–99)
Glucose, Bld: 173 mg/dL — ABNORMAL HIGH (ref 70–99)
Phosphorus: 2.4 mg/dL — ABNORMAL LOW (ref 2.5–4.6)
Phosphorus: 4.2 mg/dL (ref 2.5–4.6)
Potassium: 4.2 mmol/L (ref 3.5–5.1)
Potassium: 4.9 mmol/L (ref 3.5–5.1)
Sodium: 133 mmol/L — ABNORMAL LOW (ref 135–145)
Sodium: 135 mmol/L (ref 135–145)

## 2018-10-18 LAB — GLUCOSE, CAPILLARY
Glucose-Capillary: 60 mg/dL — ABNORMAL LOW (ref 70–99)
Glucose-Capillary: 197 mg/dL — ABNORMAL HIGH (ref 70–99)
Glucose-Capillary: 107 mg/dL — ABNORMAL HIGH (ref 70–99)
Glucose-Capillary: 113 mg/dL — ABNORMAL HIGH (ref 70–99)
Glucose-Capillary: 159 mg/dL — ABNORMAL HIGH (ref 70–99)
Glucose-Capillary: 135 mg/dL — ABNORMAL HIGH (ref 70–99)

## 2018-10-18 LAB — CBC
HCT: 34.6 % — ABNORMAL LOW (ref 36.0–46.0)
Hemoglobin: 9.7 g/dL — ABNORMAL LOW (ref 12.0–15.0)
MCH: 26.2 pg (ref 26.0–34.0)
MCHC: 28 g/dL — ABNORMAL LOW (ref 30.0–36.0)
MCV: 93.5 fL (ref 80.0–100.0)
Platelets: 154 10*3/uL (ref 150–400)
RBC: 3.7 MIL/uL — ABNORMAL LOW (ref 3.87–5.11)
RDW: 28.9 % — ABNORMAL HIGH (ref 11.5–15.5)
WBC: 33.5 10*3/uL — ABNORMAL HIGH (ref 4.0–10.5)
nRBC: 0.2 % (ref 0.0–0.2)

## 2018-10-18 LAB — MAGNESIUM: Magnesium: 2.6 mg/dL — ABNORMAL HIGH (ref 1.7–2.4)

## 2018-10-18 LAB — PROCALCITONIN: Procalcitonin: 0.32 ng/mL

## 2018-10-18 LAB — HEPARIN LEVEL (UNFRACTIONATED): Heparin Unfractionated: 0.35 IU/mL (ref 0.30–0.70)

## 2018-10-18 MED ORDER — VITAL 1.5 CAL PO LIQD
1000.0000 mL | ORAL | Status: DC
  Administered 2018-10-18 – 2018-10-25 (×5): 1000 mL
  Filled 2018-10-18 (×10): qty 1000

## 2018-10-18 MED ORDER — VITAL 1.5 CAL PO LIQD: 1000.0000 mL | ORAL | Status: DC

## 2018-10-18 MED ORDER — DEXTROSE 50 % IV SOLN
INTRAVENOUS | Status: AC
  Filled 2018-10-18: qty 50

## 2018-10-18 MED ORDER — PRO-STAT SUGAR FREE PO LIQD
30.0000 mL | Freq: Three times a day (TID) | ORAL | Status: DC
  Administered 2018-10-18 – 2018-11-12 (×66): 30 mL
  Filled 2018-10-18 (×68): qty 30

## 2018-10-18 MED ORDER — DEXTROSE 50 % IV SOLN
50.0000 mL | Freq: Once | INTRAVENOUS | Status: AC
  Administered 2018-10-18: 03:00:00 50 mL via INTRAVENOUS

## 2018-10-18 MED ORDER — SODIUM PHOSPHATES 45 MMOLE/15ML IV SOLN
20.0000 mmol | Freq: Once | INTRAVENOUS | Status: AC
  Administered 2018-10-18: 09:00:00 20 mmol via INTRAVENOUS
  Filled 2018-10-18: qty 6.67

## 2018-10-18 NOTE — Progress Notes (Signed)
[place] for Heparin Indication: atrial fibrillation  Allergies  Allergen Reactions  . Doxycycline     REACTION: Wheals and pruritus    Patient Measurements: Height: 5' (152.4 cm) Weight: 220 lb 14.4 oz (100.2 kg) IBW/kg (Calculated) : 45.5  HEPARIN DW (KG): 71.8  Vital Signs: Temp: 98 F (36.7 C) (06/23 0729) Temp Source: Oral (06/23 0729) BP: 121/96 (06/23 0730) Pulse Rate: 53 (06/23 0730)  Labs: Recent Labs    10/15/18 1238  10/16/18 0518  10/17/18 0340 10/17/18 1644 10/18/18 0348  HGB  --    < > 9.9*  --  9.8*  --  9.7*  HCT  --   --  34.6*  --  34.4*  --  34.6*  PLT  --   --  146*  --  141*  --  154  HEPARINUNFRC  --   --  0.23*  --  0.33  --  0.35  CREATININE  --    < > 1.23*   < > 1.13* 1.34* 1.38*  TROPONINI 0.04*  --   --   --   --   --   --    < > = values in this interval not displayed.    Estimated Creatinine Clearance: 42.7 mL/min (A) (by C-G formula based on SCr of 1.38 mg/dL (H)).  Assessment: 68 y.o. female with h/o Afib, (PTA Eliquis on hold).S/p trach placement.  Pharmacy has been consulted to dose heparin.   Heparin level remains therapeutic this morning at 0.35. CBC stable with no signs or symptoms of bleeding noted. No issues with the infusion overnight per RN.   Goal of Therapy:  Heparin level 0.3-0.7 units/ml  Monitor platelets by anticoagulation protocol: Yes   Plan:  Continue Heparin 1200 units/hr Daily CBC + heparin level while on heparin Monitor for s/sx bleeding   Brendolyn Patty, PharmD PGY1 Pharmacy Resident Phone [phone]  10/18/2018   8:04 AM

## 2018-10-18 NOTE — Progress Notes (Signed)
Pt Pa02 48- Sat 78%. Pt placed back on the vent by RT, 60% Fi02. Pt tolerating well. Sat now 100%

## 2018-10-18 NOTE — Progress Notes (Signed)
Patient son and daughter in law called and wanted to retrieve his mothers debit card. I asked patient if this was fine, she mouthed "yes". I asked her a few more times if this was fine, she mouthed and nodded "yes".  Pts son arrived at this time to retrieve patients wallet.  I handed patients pink wallet to [name], pts daughter in law.

## 2018-10-18 NOTE — TOC Initial Note (Signed)
Transition of Care [facility]) - Initial/Assessment Note    Patient Details  Name: Kathleen [name] MRN: [id number] Date of Birth: 05-Jul-1950  Transition of Care [facility]) CM/SW Contact:    Midge Minium RN, BSN, NCM-BC, ACM-RN [phone] Phone Number: 10/18/2018, 4:33 PM  Clinical Narrative:                 CM following for dispositional needs. Patient is a 68 yo female who lived at home alone, independent with ADLs, with assistance with groceries provided by her sister. Patient ambulates with a SPC, RW and have grab bars. Patient remains acutely ill on CRRT and tolerating trach collar. PT/OT eval complete recommending CIR with the Rehab [facility] following for appropriateness at a distance until kidney needs addressed and outpatient HD need determined. CM team will continue to follow.  Expected Discharge Plan: IP Rehab Facility Barriers to Discharge: Continued Medical Work up   Patient Goals and CMS Choice     Choice offered to / list presented to : NA(Rehab [facility] following)  Expected Discharge Plan and Services Expected Discharge Plan: [place] In-house Referral: NA Discharge Planning Services: CM Consult Post Acute Care Choice: NA Living arrangements for the past 2 months: Single Family Home  Prior Living Arrangements/Services Living arrangements for the past 2 months: Single Family Home Lives with:: Self              Current home services: DME    Activities of Daily Living Home Assistive Devices/Equipment: Cane (specify quad or straight), Eyeglasses, Dentures (specify type), Grab bars in shower, Hand-held shower hose, Oxygen, Scales, CBG Meter, Walker (specify type), Shower chair without back, Blood pressure cuff ADL Screening (condition at time of admission) Patient's cognitive ability adequate to safely complete daily activities?: Yes Is the patient deaf or have difficulty hearing?: No Does the patient have difficulty seeing, even when wearing glasses/contacts?: Yes Does the  patient have difficulty concentrating, remembering, or making decisions?: No Patient able to express need for assistance with ADLs?: Yes Does the patient have difficulty dressing or bathing?: No Independently performs ADLs?: Yes (appropriate for developmental age) Does the patient have difficulty walking or climbing stairs?: Yes Weakness of Legs: None Weakness of Arms/Hands: None  Admission diagnosis:  Heart Failure Patient Active Problem List   Diagnosis Date Noted  . Acute on chronic diastolic CHF (congestive heart failure) ([place]) 09/22/2018  . Acute kidney injury superimposed on chronic kidney disease ([place]) 09/22/2018  . Acute respiratory failure with hypoxia ([place]) 09/22/2018  . History of recent fall 09/06/2018  . Tinea pedis 08/31/2017  . GERD (gastroesophageal reflux disease) 08/24/2016  . Atrial flutter ([place]) 04/06/2016  . Mobitz type 1 second degree atrioventricular block 02/15/2015  . Chronic diastolic congestive heart failure ([place]) 01/10/2015  . CKD (chronic kidney disease) stage 3, GFR 30-59 ml/min (HCC)   . Chronic respiratory failure ([place]) 06/30/2013  . ACE-inhibitor cough 09/29/2012  . Onychomycosis 10/06/2011  . Type 1 superior labral anterior-to-posterior (SLAP) tear of shoulder 07/02/2011  . Preventative health care 02/12/2011  . Hypertension 02/12/2011  . Peripheral neuropathy 09/06/2010  . OBESITY 11/13/2008  . Type 2 diabetes mellitus with other specified complication ([place]) 14/43/1540  . Dyslipidemia 11/06/2007   PCP:  Aldine Contes, MD Pharmacy:   [id number] [place], [place] [address] Phone: [phone] Fax: [place], [facility] [place] [address] Phone: [phone] Fax: [phone]  [place], [place] [address] Phone: [phone] Fax:  [phone]     Social Determinants of Health (SDOH) Interventions    Readmission Risk Interventions Readmission Risk Prevention Plan 10/18/2018  Transportation Screening Not Complete  Transportation Screening Comment Continued medical work-up; CIR pending  Medication Review Press photographer) Complete  PCP or Specialist appointment within 3-5 days of discharge Not Complete  PCP/Specialist Appt Not Complete comments Continued medical work-up; CIR pending  HRI or [place] Complete  SW Recovery Care/Counseling Consult Complete  Palliative Care Screening Not [place] Not Applicable  Some recent data might be hidden

## 2018-10-18 NOTE — Evaluation (Signed)
[facility] Speaking Valve - Evaluation Patient Details  Name: Kathleen [name] MRN: [id number] Date of Birth: 11-17-1950  Today's Date: 10/18/2018 Time: 8527-7824 SLP Time Calculation (min) (ACUTE ONLY): 11 min  Past Medical History:  Past Medical History:  Diagnosis Date  . Atrial flutter ([place])    a. permanent.  . Bradycardia   . Chronic diastolic (congestive) heart failure ([place]) 01/10/2015  . CKD (chronic kidney disease), stage IV ([place])   . Degenerative joint disease of hand   . Diabetes mellitus   . Dyslipidemia   . Fecal occult blood test positive   . GERD (gastroesophageal reflux disease)   . Headache   . Hypertension   . Inadequate material resources   . Irritable bowel syndrome   . Morbid obesity ([place])   . Obesity hypoventilation syndrome ([place])   . Post-menopausal bleeding   . Shortness of breath dyspnea    multifactorial from obesity, deconditioning, obesity hypoventilation syndrome   Past Surgical History:  Past Surgical History:  Procedure Laterality Date  . CHOLECYSTECTOMY    . EYE SURGERY     left lens implant s/p cataracts  . IR FLUORO GUIDE CV LINE RIGHT  10/03/2018  . IR US GUIDE VASC ACCESS RIGHT  10/03/2018  . OTHER SURGICAL HISTORY     right shoulder tendon repair 05/2011  . RIGHT HEART CATH N/A 09/23/2018   Procedure: RIGHT HEART CATH;  Surgeon: Jolaine Artist, MD;  Location: [facility];  Service: Cardiovascular;  Laterality: N/A;   HPI:  68 y.o. female admitted 09/22/2018 for surgery. PMH: permanent atrial flutter with baseline bradycardia, hypertension, super morbid obesity, IIDDM, dyslipidemia, obesity hypoventilation syndrome, CKD stage IV, chronic diastolic CHF, chronic edema. PEA 09/24/2018. Intubated 5/30-09/28/2018, again 6/11until trach 6/19. SLP was seeing pt in between intubations with concern for post-extubation dysphagia and instrumental testing recommended, although this was not completed as pt had been reintubated.   Assessment / Plan /  Recommendation Clinical Impression  Pt had evidence of reduced upper airway patency, with no clear airflow after cuff deflation with trials of finger occlusion and transient PMV placement. Her valve was placed for a few breath cycles at a time before back pressure would develop. SLP provided Mod cues and assisted with use of yankauer to eventually clear a large amount of secretions orally, although with minimal change in ability to wear PMV afterwards. In addition to secretions, reduced patency may also be related to size of trach. SLP will continue to follow for additional PMV trials. Would use PMV with SLP only for now, but would focus on cuff deflation as tolerated.  SLP Visit Diagnosis: Aphonia (R49.1)    SLP Assessment  Patient needs continued Speech Lanaguage Pathology Services    Follow Up Recommendations  (tba)    Frequency and Duration min 2x/week  2 weeks    PMSV Trial PMSV was placed for: few breath cycles at a time Able to redirect subglottic air through upper airway: No Able to Attain Phonation: No Voice Quality: Aphonic Able to Expectorate Secretions: Yes Level of Secretion Expectoration with PMSV: Oral(with use of yankauer) Breath Support for Phonation: Inadequate Intelligibility: Unable to assess (comment) Respirations During Trial: 21 SpO2 During Trial: 97 % Pulse During Trial: 56 Behavior: Alert;Cooperative   Tracheostomy Tube       Vent Dependency  FiO2 (%): 40 %    Cuff Deflation Trial  GO Tolerated Cuff Deflation: Yes Length of Time for Cuff Deflation Trial: ~10 min Behavior: Alert;Cooperative  [name] Rosalita Carey 10/18/2018, 11:14 AM   Pollyann Glen, M.A. [place] Acute Environmental education officer [phone] Office [phone]

## 2018-10-18 NOTE — Progress Notes (Signed)
Nutrition Follow-up  DOCUMENTATION CODES:   Morbid obesity  INTERVENTION:   Increase tube feeding:  -Vital 1.5 @40ml  (972ml)via Cortrak -Change to 30 ml ProstatTID  Provides: 1740kcal, 110grams protein, 763ml free water. Meets 107% ofkcal needs and 100% of protein needs.  NUTRITION DIAGNOSIS:   Inadequate oral intake related to inability to eat as evidenced by NPO status.  Ongoing  GOAL:   Provide needs based on ASPEN/SCCM guidelines  Addressed via TF  MONITOR:   Vent status, Labs, Weight trends, I & O's, TF tolerance  REASON FOR ASSESSMENT:   Consult Enteral/tube feeding initiation and management  ASSESSMENT:   68 year old with morbid obesity, acute on chronic diastolic heart failure, pulmonary hypertension transferred to the ICU after PEA arrest.  Suspect respiratory failure due to pulmonary edema   5/30 - s/p PEA arrest, intubated and transferred to ICU,CRRTinitiated 6/2- failed SBT, pulm edema 6/3 extubated, refused BiPAP 6/10- CRRT stopped  6/11- re-intubated, CRRT re-started 6/19- failed SBT, s/p trach   Pt discussed during ICU rounds and with RN.   Continues to be kept even on CRRT. Off levophed, remains on dopamine. Plan to transition to iHD once current CRRT filter clots/expires. Now on trach collar. Will adjust nutrition needs to better meet needs. B complex with Vitamin C discontinued. Will see if it can be re-ordered. Pt had BM on 6/20 and continues with bowel regimen.   Admission weight: 134.8 kg Current weight 100.2 kg (up from 97.3 kg on 6/19)  Generalized +1 edema remains per nursing assessment.   I/O: -1,364 ml since 6/9 UOP: none CRRT: 1,425 ml x 24 hrs   Drips: dopamine, sodium phosphate in D5 Medications: SS novolog, Levemir, miralax Labs: Na 133 (L) Phosphorus 2.4 (L) Mg 2.6 (H) corrected calcium 9.6 (wdl) CBG 99-197  Diet Order:   Diet Order            Diet NPO time specified  Diet effective midnight               EDUCATION NEEDS:   No education needs have been identified at this time  Skin:  Skin Assessment: Skin Integrity Issues: Skin Integrity Issues:: Other (Comment), Incisions Incisions: R neck/chest Other: MASD- bilateral breast, abdomen, back and Blister to R hip  Last BM:  6/20  Height:   Ht Readings from Last 1 Encounters:  10/05/18 5' (1.524 m)    Weight:   Wt Readings from Last 1 Encounters:  10/18/18 100.2 kg    Ideal Body Weight:  45.5 kg  BMI:  Body mass index is 43.14 kg/m.  Estimated Nutritional Needs:   Kcal:  1700-1900 kcal  Protein:  100-120 grams  Fluid:  >/= 1.7 L/day   Mariana Single RD, LDN Clinical Nutrition Pager # - [phone]

## 2018-10-18 NOTE — Progress Notes (Signed)
NAME:  Kathleen [name], MRN:  [id number], DOB:  1950/12/17, LOS: 83 ADMISSION DATE:  09/22/2018, CONSULTATION DATE: 09/24/2018 REFERRING MD: Candee Furbish MD, CHIEF COMPLAINT: Cardiac arrest  Brief History   68 y/o obese woman with chronic diastolic heart failure who failed outpatient diuretics management due to chronic kidney disease.  Right heart cath on 5/29 showed markedly elevated biventricular filling pressures with normal cardiac output.  She was on cardiology service, maintained on milrinone and Lasix.  On the morning of 5/30 she went into PEA arrest, CPR performed for 8 minutes.  Intubated and transferred to ICU.  Started on CRRT 5/31.  Extubated 6/3.  Re-intubated early am 6/11 for hypercarbic respiratory failure and underwent tracheostomy on 6/19  Past Medical History   has a past medical history of Atrial flutter ([place]), Bradycardia, Chronic diastolic (congestive) heart failure ([place]) (01/10/2015), CKD (chronic kidney disease), stage IV ([place]), Degenerative joint disease of hand, Diabetes mellitus, Dyslipidemia, Fecal occult blood test positive, GERD (gastroesophageal reflux disease), Headache, Hypertension, Inadequate material resources, Irritable bowel syndrome, Morbid obesity ([place]), Obesity hypoventilation syndrome (), Post-menopausal bleeding, and Shortness of breath dyspnea.  Significant Hospital Events   5/28 Admit 5/29 RHC 5/30 PEA arrest, intubated/ CRRT 5/31 Milrinone stopped due to ectopy; brady episode- amio stopped 6/02 Attempt at SBT, chest x-ray with worsening pulmonary edema, CVVHD for volume removal 6/03 Extubated > remain on CRRT.  Refused nocturnal BiPAP 6/04 Refused nocturnal BiPAP 6/10 off CRRT 6/11 Intubated early am with hypercarbia, concern for RUL PNA; abx broadened; restarted on CRRT 6/16  No events overnight, weaning on high PS 6/17  failed vent wean, remains on levophed, midodrine and CRRT 6/18  Weaned for 5 hours on PSV 8/5 before requiring full support;  ongoing CRRT, remains on levophed 14 mcg 6/19 - TRACH + Dr Erskine Emery 6/20 -. On IV heparin gt, levophed gtt with midodrine. Off fent gtt. Pressopr needs down  Consults:  Cardiology PCCM  Nephrology   Procedures:  Lt PICC 5/29 >> OETT 5/30 >> 6/3; 6/11 > 6/19 - TRACH (Dr Erskine Emery) >> R IJ HD cath 5/30 >> Aline left ulnar 5/30 >> 6/2  Significant Diagnostic Tests:  TTE 5/28 >>The left ventricle has normal systolic function with an ejection fraction of 60-65%. . The right ventricle has normal systolic function. The cavity was mildly enlarged.   Right heart cath 5/29 RA = 24 RV = 94/26 PA = 95/36 (53) PCW = 30 (v = 50) Fick cardiac output/index = 6.0/2.7 PVR =3.9 WU FA sat = 91% PA sat = 58%, 58% SVC 60%  CXR 6/15> Bilateral ASD LLL > R. ETT, GT, LIJ and RIJ CVCs stable position.   Micro Data:  SARS coronavirus 2 cepheid 5/28 >> neg MRSA PCR 5/28 >> neg Trach aspirate 6/2 >> MSSA BCx2 6/2 >> negative ...................... Tracheal aspirate 6/11 >> few GNR >> few proteus and few candida tropicalis BCx2 6/11 >> negative  Antimicrobials:  Vancomycin 6/3 > 6/4 Cefazolin 6/4 >> 6/10 ...........................Marland Kitchen Vanco 6/11 >> 6/17 Cefepime 6/11 >>6/18  Interim history/subjective:   Remains on trach collar No acute distress at rest Questionable starting of empirical antimicrobial therapy with Zosyn White count 33.5 procalcitonin .32  Objective   Blood pressure (!) 95/41, pulse (!) 53, temperature 98 F (36.7 C), temperature source Oral, resp. rate (!) 21, height 5' (1.524 m), weight 100.2 kg, SpO2 96 %. CVP:  [6 mmHg-8 mmHg] 8 mmHg  Vent Mode: PSV;CPAP FiO2 (%):  [40 %] 40 %  PEEP:  [5 cmH20] 5 cmH20 Pressure Support:  [5 [place] Pressure:  [17 cmH20] 17 cmH20   Intake/Output Summary (Last 24 hours) at 10/18/2018 0900 Last data filed at 10/18/2018 0800 Gross per 24 hour  Intake 1661.69 ml  Output 1347 ml  Net 314.69 ml   Filed Weights    10/16/18 0500 10/17/18 0600 10/18/18 0500  Weight: 99.3 kg 99.8 kg 100.2 kg    General: Morbidly obese female who is awake and alert currently on trach collar HEENT: Tracheostomy is unremarkable currently on trach collar Neuro: Awake follows commands CV: Heart sounds irregular with bradycardia, currently on dopamine and off Levophed PULM: Decreased breath sounds in the bases MV:HQIO, non-tender, bsx4 active  Extremities: warm/dry 2+ edema  Skin: no rashes or lesions   10/18/2018 no chest   Resolved Hospital Problem list    Assessment & Plan:  ASSESSMENT / PLAN:  PULMONARY A:  Baseline hx - OSA/OHV - non compliant  Acute on chronic resp failure following cardiac arrest- prolonged mech vent and s/p trach 10/14/2018  P:   Currently on trach collar for 18 hours trach collar as tolerated  NEUROLOGIC A:   Acute Encephalopathy - resolved 6/19. Off fent gtt as of 10/15/2018   P:   Continue Seroquel She has PRN Versed Low-dose Klonopin  VASCULAR A:   Circulatory shock - ?if she is still dry, pressor requirement improved after fluid bolus yesterday   P:  Continue midodrine 15 mg Levophed has been discontinued He requires dopamine for bradycardia Steroids have been weaned off   CARDIAC A: PEA arrest : -suspect hypoxia driven Acute on chronic diastolic heart failure --admit with elevated filling pressures, cardiorenal syndrome  Pulmonary hypertension, severe : -s/p RHC, WHO class 2,3 Permanent AF/AFl - -on anticoagulation pre admit NSVT/VT - -off amio due to bradycardia  P: Per heart failure team Remains on heparin drip most likely will need permanent access for hemodialysis near future Remains on low-dose dopamine for bradycardia  INFECTIOUS A:   MSSA PNA  6/2 Proteus VAP 6/11 Procalcitonin 0.32 on 10/18/2018 White count 33.5 P:   Sputum culture culture from 6/22 shows gram-positive cocci Procalcitonin mildly elevated Consideration to starting empiric  antimicrobial therapy Vanco and Zosyn  RENAL Lab Results  Component Value Date   CREATININE 1.38 (H) 10/18/2018   CREATININE 1.34 (H) 10/17/2018   CREATININE 1.13 (H) 10/17/2018   CREATININE 1.60 (H) 04/06/2016   CREATININE 0.94 06/07/2014   CREATININE 0.93 06/02/2013   Recent Labs  Lab 10/17/18 0340 10/17/18 1644 10/18/18 0348  K 4.2 4.5 4.2    A:  Cardiorenal syndrome AKI/ CKD 3-4, remains anuric P:  CRRT per nephrology She is currently on euvolemic protocol Transition to intermittent hemodialysis once CRRT filter clotted or expired   ENDOCRINE CBG (last 3)  Recent Labs    10/18/18 0323 10/18/18 0358 10/18/18 0728  GLUCAP 60* 197* 107*   Filed Weights   10/16/18 0500 10/17/18 0600 10/18/18 0500  Weight: 99.3 kg 99.8 kg 100.2 kg    A:   Morbid OBestity DM-   P:   Continue Levemir 30 units twice daily Continue tube feeding coverage every 4 hours Sliding scale insulin protocol is resistant scale  HEMATOLOGIC Recent Labs    10/17/18 0340 10/18/18 0348  HGB 9.8* 9.7*  ' A:  At risk anemia critical illness P:  Transfuse per protocol    GI A:   Tolerting TF Constipation P:   Proton pump inhibitor Ongoing  bowel regimen Swallowing evaluation near future, once she is on trach collar 24 7   MSK/DERM RT hip blister - 6/17 P Continue dressings per RN  Best practice:  Diet: NPO for trach, will resume TF per cortrak , swallow evaluation near future note she has a tracheostomy in place and tolerating trach collar Pain/Anxiety/Delirium protocol (if indicated):  In place  VAP protocol (if indicated): In place  DVT prophylaxis: Heparin gtt GI prophylaxis: Pepcid Glucose control: SSI Mobility: Bed Code Status: Full Family Communication: none Disposition: ICU   App CCT 30 min   Richardson Landry Maylen Waltermire ACNP Maryanna Shape PCCM Pager [phone] till 1 pm If no answer page [phone] 10/18/2018, 9:00 AM

## 2018-10-18 NOTE — Progress Notes (Signed)
Pt son and daughter in law called [employer] stating the debit card was not in patients pink wallet. I stated that I did not look through the wallet, I only brought it to them. Son asked for the rest of the patients belongings. This RN, took the son the patients belongings including clothing, life alert necklace, earrings in denture cup,cane, and a blue bag.

## 2018-10-18 NOTE — Progress Notes (Signed)
[facility] Progress Note Patient Name: Kathleen [name] DOB: 06-15-1950 MRN: [id number]   Date of Service  10/18/2018  HPI/Events of Note  Notified by bedside ICU RN regarding hypoglycemia.  Pt is on Levemir 30 units BID and getting novolog 10 units q4 hours and sliding scale resistant scale.    eICU Interventions  Advised to continue giving Levemir and sliding scale correction scale.  Hold the scheduled novolog 10 units q4hrs.   Continue monitoring glucoses while in the ICU.     Intervention Category Minor Interventions: Other:  [name] 10/18/2018, 4:15 AM

## 2018-10-18 NOTE — Progress Notes (Signed)
Pt scheduled to get CPT at 1400, pt currently receiving bedside dialysis in her chair. Will do CPT at a later time than scheduled.

## 2018-10-18 NOTE — Progress Notes (Addendum)
Patient ID: Kathleen [name], female   DOB: Sep 23, 1950, 68 y.o.   MRN: [id number]    Advanced Heart Failure Rounding Note   Subjective:     Has been on trach collar since yesterday. Sat up in chair for several hours.   Remains on CVVHD running even. Weight essentially stable. Off NE. Remains on dopa for bradycardia. HRs in 40-50s  SBP 90-110   Objective:   Weight Range:  Vital Signs:   Temp:  [97.8 F (36.6 C)-98.1 F (36.7 C)] 98 F (36.7 C) (06/23 0729) Pulse Rate:  [42-55] 48 (06/23 0800) Resp:  [13-27] 21 (06/23 0800) BP: (84-121)/(22-96) 105/63 (06/23 0800) SpO2:  [89 %-100 %] 94 % (06/23 0800) FiO2 (%):  [40 %] 40 % (06/23 0800) Weight:  [100.2 kg] 100.2 kg (06/23 0500) Last BM Date: 10/15/18  Weight change: Filed Weights   10/16/18 0500 10/17/18 0600 10/18/18 0500  Weight: 99.3 kg 99.8 kg 100.2 kg    Intake/Output:   Intake/Output Summary (Last 24 hours) at 10/18/2018 0833 Last data filed at 10/18/2018 0800 Gross per 24 hour  Intake 1725.82 ml  Output 1418 ml  Net 307.82 ml     Physical Exam: General:  Lying in bed on trach collar. No resp difficulty HEENT: normal Neck: supple. JVP 6. Carotids 2+ bilat; no bruits. No lymphadenopathy or thryomegaly appreciated. Cor: PMI nondisplaced. Irregular Loletha Grayer. No rubs, gallops or murmurs. Lungs: clear Abdomen: obese soft, nontender, nondistended. No hepatosplenomegaly. No bruits or masses. Good bowel sounds. Extremities: no cyanosis, clubbing, rash, tr edema + boots Neuro: alert & orientedx3, cranial nerves grossly intact. moves all 4 extremities w/o difficulty. Affect pleasant   Telemetry: AF 40-50s. Personally reviewed   Labs: Basic Metabolic Panel: Recent Labs  Lab 10/14/18 0600  10/15/18 0632  10/16/18 0518 10/16/18 1512 10/17/18 0340 10/17/18 1644 10/18/18 0348  NA 135   < > 134*   < > 135 134* 132* 135 133*  K 5.1   < > 4.1   < > 4.3 4.6 4.2 4.5 4.2  CL 101   < > 101   < > 103 101 103 102 101   CO2 22   < > 23   < > '24 25 23 25 26  '$ GLUCOSE 186*   < > 169*   < > 258* 182* 230* 136* 197*  BUN 38*   < > 33*   < > 27* 28* 25* 29* 28*  CREATININE 1.70*   < > 1.35*   < > 1.23* 1.11* 1.13* 1.34* 1.38*  CALCIUM 8.4*   < > 8.2*   < > 7.8* 8.2* 7.5* 8.1* 7.8*  MG 2.8*  --  2.6*  --  2.4  --  2.3  --  2.6*  PHOS 3.1   < > 2.6   < > 2.4* 2.7 2.5 2.6 2.4*   < > = values in this interval not displayed.    Liver Function Tests: Recent Labs  Lab 10/13/18 0259  10/16/18 0518 10/16/18 1512 10/17/18 0340 10/17/18 1644 10/18/18 0348  AST 38  --   --   --   --   --   --   ALT 23  --   --   --   --   --   --   ALKPHOS 88  --   --   --   --   --   --   BILITOT 1.6*  --   --   --   --   --   --  PROT 7.5  --   --   --   --   --   --   ALBUMIN 2.4*   < > 2.1* 2.0* 1.8* 1.9* 1.7*   < > = values in this interval not displayed.   No results for input(s): LIPASE, AMYLASE in the last 168 hours. No results for input(s): AMMONIA in the last 168 hours.  CBC: Recent Labs  Lab 10/14/18 0600 10/15/18 0107 10/16/18 0518 10/17/18 0340 10/18/18 0348  WBC 21.2* 22.1* 37.8* 36.2* 33.5*  HGB 11.5* 9.9* 9.9* 9.8* 9.7*  HCT 39.7 32.6* 34.6* 34.4* 34.6*  MCV 89.6 91.8 92.0 93.7 93.5  PLT 127* 73* 146* 141* 154    Cardiac Enzymes: Recent Labs  Lab 10/13/18 0259 10/15/18 0053 10/15/18 0632 10/15/18 1238  TROPONINI 0.05* 0.04* 0.04* 0.04*    BNP: BNP (last 3 results) Recent Labs    09/22/18 1420  BNP 451.2*    ProBNP (last 3 results) No results for input(s): PROBNP in the last 8760 hours.    Other results:  Imaging: Dg Chest Port 1 View  Result Date: 10/16/2018 CLINICAL DATA:  Shortness of breath and pulmonary edema EXAM: PORTABLE CHEST 1 VIEW COMPARISON:  10/14/2018 and prior radiographs FINDINGS: Tracheostomy tube, small bore feeding tube, RIGHT IJ central venous catheter with tip overlying the SUPERIOR cavoatrial junction and LEFT PICC line with tip overlying the UPPER SVC  again noted. Interstitial opacities/edema bibasilar opacities/atelectasis are not significantly changed. No pneumothorax. IMPRESSION: Unchanged appearance of the chest with bilateral interstitial opacities/edema bibasilar opacities/atelectasis Electronically Signed   By: Margarette Canada M.D.   On: 10/16/2018 18:28     Medications:     Scheduled Medications: . aspirin  81 mg Per Tube Daily  . atorvastatin  40 mg Oral q1800  . chlorhexidine gluconate (MEDLINE KIT)  15 mL Mouth Rinse BID  . Chlorhexidine Gluconate Cloth  6 each Topical Daily  . clonazePAM  0.5 mg Per Tube BID  . dextrose      . feeding supplement (PRO-STAT SUGAR FREE 64)  30 mL Per Tube QID  . feeding supplement (VITAL 1.5 CAL)  1,000 mL Per Tube Q24H  . insulin aspart  0-20 Units Subcutaneous Q4H  . insulin aspart  10 Units Subcutaneous Q4H  . insulin detemir  30 Units Subcutaneous BID  . mouth rinse  15 mL Mouth Rinse 10 times per day  . midodrine  15 mg Oral TID WC  . polyethylene glycol  17 g Oral Daily  . QUEtiapine  50 mg Oral QHS  . sodium chloride flush  10-40 mL Intracatheter Q12H    Infusions: .  prismasol BGK 4/2.5 300 mL/hr at 10/18/18 0739  .  prismasol BGK 4/2.5 300 mL/hr at 10/17/18 2337  . sodium chloride 10 mL/hr at 10/18/18 0800  . DOPamine 5 mcg/kg/min (10/18/18 0800)  . famotidine (PEPCID) IV 20 mg (10/17/18 1430)  . heparin 1,200 Units/hr (10/18/18 0800)  . norepinephrine (LEVOPHED) Adult infusion Stopped (10/18/18 0723)  . prismasol BGK 4/2.5 1,500 mL/hr at 10/18/18 0749  . sodium chloride 999 mL/hr at 10/06/18 1018  . sodium phosphate  Dextrose 5% IVPB      PRN Medications: sodium chloride, acetaminophen (TYLENOL) oral liquid 160 mg/5 mL, bisacodyl, docusate, heparin, midazolam, ondansetron, sodium chloride, sodium chloride flush   Assessment/Plan:   1. PEA arrest on 5/30 - due to hypoxia/pulmonary edema. - now stable  2. Acute on chronic hypoxic respiratory failure - has underlying   OHS/OSA  -  intubated on 5/30 in setting of arrest. extubated 6/3.reintubated 6/11 with worsening RUL aispace disease.  - treated for MSSA PNA with cefipime/vanc.  - s/p trach 6/19. Now on trach collar. Continue to wean. Needs aggressive PT and pulmonary toilet.  - Management per CCM   3.  Acute on chronic diastolic HF - Admitted with markedly elevated filling pressures complicated by severe cardiorenal syndrome - Now down 80+ pounds with CVVHD. - Volume management per Renal and CVVHD. Now appears euvolemic - ? Need to w/u for possible TTR amyloid  4. Pulmonary HTN, severe  - this is mostly pulmonary venous HTN by cath [facility] 2) but also has a component of OHS/OSA (WHO group 3). Improved with volume removal.  - no role for selective pulmonary vasodilators  5. AKI on CKD 3 - baseline creatinine 1.9 -> ESRD - Remains on CVVHD. Renal managing - Hopefully we can get on iHD in near future.  Will increase midodrine to 15 tid to help facilitate  6. Morbid obesity - needs weight loss  6. Chronic AF/AFL with slow VR and symptomatic bradycardia - Off apixaban. Now on heparin. No bleeding. Discussed dosing with PharmD personally. - Started on dopamine for sustained rates in 30s last night. VR back up to 40-50 range. Will begin wean - Ideally would probably benefit from PPM but poor candidate given co-morbidities and recent HD start. Have d/w EP who agrees that she is not PM candidate currently - Will likely need permanent HD access placed so will not start warfarin yet   7. VT/NSVT - Quiescent.  off amio (for 2 weeks) due to bradycardia - Keep K> 4.0 Mg 2.0  8. MSSA PNA - Bcx drawn 6/2 & 6/11. NGTD - sputum cx with MSSA PNA -Treated with cefipime/vanc - course completed. No infectious x currently. WBC dropping 36K -> 33K. PCT low and falling.   9. Hypotension - Persists - Now on midodrine 15 tid. Off NE.  - We wil try to wean dopamine slowly. Keep SBP >= 90  - This has been a  major barrier   CRITICAL CARE Performed by: Glori Bickers  Total critical care time: 35 minutes  Critical care time was exclusive of separately billable procedures and treating other patients.  Critical care was necessary to treat or prevent imminent or life-threatening deterioration.  Critical care was time spent personally by me (independent of midlevel providers or residents) on the following activities: development of treatment plan with patient and/or surrogate as well as nursing, discussions with consultants, evaluation of patient's response to treatment, examination of patient, obtaining history from patient or surrogate, ordering and performing treatments and interventions, ordering and review of laboratory studies, ordering and review of radiographic studies, pulse oximetry and re-evaluation of patient's condition.  Length of Stay: 10  Glori Bickers MD 10/18/2018, 8:33 AM  Advanced Heart Failure Team Pager [phone] (M-F; [place])  Please contact [facility] for night-coverage after hours (4p -7a ) and weekends on [email or web address]

## 2018-10-18 NOTE — Progress Notes (Signed)
[facility]    NEPHROLOGY PROGRESS NOTE  SUBJECTIVE: continues on CRRT at keep even.  She has just come off of the levo and remains on dopamine - started a couple of days ago for bradycardia per nursing.  CRRT filter changed about 18 hours ago per nursing.  Had 1.4 liters UF over 6/22 with CRRT.    Review of systems:  Denies n/v Denies air hunger Denies pain  OBJECTIVE:  Vitals:   10/18/18 0630 10/18/18 0700  BP: (!) 97/48 (!) 116/38  Pulse: (!) 51 (!) 50  Resp: 18 19  Temp:    SpO2: (!) 89% 94%    Intake/Output Summary (Last 24 hours) at 10/18/2018 7078 Last data filed at 10/18/2018 0700 Gross per 24 hour  Intake 1787.07 ml  Output 1425 ml  Net 362.07 ml      General: adult female in bed critically ill  HEENT: MMM [place] AT anicteric sclera Neck:  No JVD, no adenopathy, positive tracheostomy CV:  RRR  Lungs:  L/S CTA bilaterally Abd:  abd SNT/ND with normal BS Extremities: Trace bilateral LE edema. Access: right subclavian tunneled catheter   MEDICATIONS:  . aspirin  81 mg Per Tube Daily  . atorvastatin  40 mg Oral q1800  . chlorhexidine gluconate (MEDLINE KIT)  15 mL Mouth Rinse BID  . Chlorhexidine Gluconate Cloth  6 each Topical Daily  . clonazePAM  0.5 mg Per Tube BID  . dextrose      . feeding supplement (PRO-STAT SUGAR FREE 64)  30 mL Per Tube QID  . feeding supplement (VITAL 1.5 CAL)  1,000 mL Per Tube Q24H  . insulin aspart  0-20 Units Subcutaneous Q4H  . insulin aspart  10 Units Subcutaneous Q4H  . insulin detemir  30 Units Subcutaneous BID  . mouth rinse  15 mL Mouth Rinse 10 times per day  . midodrine  15 mg Oral TID WC  . polyethylene glycol  17 g Oral Daily  . QUEtiapine  50 mg Oral QHS  . sodium chloride flush  10-40 mL Intracatheter Q12H       LABS:   CBC Latest Ref Rng & Units 10/18/2018 10/17/2018 10/16/2018  WBC 4.0 - 10.5 K/uL 33.5(H) 36.2(H) 37.8(H)  Hemoglobin 12.0 - 15.0 g/dL 9.7(L) 9.8(L) 9.9(L)  Hematocrit 36.0 - 46.0  % 34.6(L) 34.4(L) 34.6(L)  Platelets 150 - 400 K/uL 154 141(L) 146(L)    CMP Latest Ref Rng & Units 10/18/2018 10/17/2018 10/17/2018  Glucose 70 - 99 mg/dL 197(H) 136(H) 230(H)  BUN 8 - 23 mg/dL 28(H) 29(H) 25(H)  Creatinine 0.44 - 1.00 mg/dL 1.38(H) 1.34(H) 1.13(H)  Sodium 135 - 145 mmol/L 133(L) 135 132(L)  Potassium 3.5 - 5.1 mmol/L 4.2 4.5 4.2  Chloride 98 - 111 mmol/L 101 102 103  CO2 22 - 32 mmol/L 26 25 23   Calcium 8.9 - 10.3 mg/dL 7.8(L) 8.1(L) 7.5(L)  Total Protein 6.5 - 8.1 g/dL - - -  Total Bilirubin 0.3 - 1.2 mg/dL - - -  Alkaline Phos 38 - 126 U/L - - -  AST 15 - 41 U/L - - -  ALT 0 - 44 U/L - - -    Lab Results  Component Value Date   PTH 33 10/12/2018   CALCIUM 7.8 (L) 10/18/2018   CAION 1.18 10/12/2018   PHOS 2.4 (L) 10/18/2018       Component Value Date/Time   COLORURINE AMBER (A) 05/22/2013 1826   APPEARANCEUR HAZY (A) 05/22/2013 1826   LABSPEC  1.024 05/22/2013 1826   PHURINE 6.0 05/22/2013 1826   GLUCOSEU NEGATIVE 05/22/2013 1826   HGBUR NEGATIVE 05/22/2013 1826   BILIRUBINUR SMALL (A) 05/22/2013 1826   KETONESUR NEGATIVE 05/22/2013 1826   PROTEINUR 100 (A) 05/22/2013 1826   UROBILINOGEN 4.0 (H) 05/22/2013 1826   NITRITE NEGATIVE 05/22/2013 1826   LEUKOCYTESUR NEGATIVE 05/22/2013 1826      Component Value Date/Time   PHART 7.368 10/12/2018 0433   PCO2ART 42.7 10/12/2018 0433   PO2ART 126.0 (H) 10/12/2018 0433   HCO3 24.6 10/12/2018 0433   TCO2 26 10/12/2018 0433   ACIDBASEDEF 1.0 10/12/2018 0433   O2SAT 99.0 10/12/2018 0433       Component Value Date/Time   IRON 44 10/12/2018 1640   TIBC 379 10/12/2018 1640   IRONPCTSAT 12 10/12/2018 1640       ASSESSMENT/PLAN:    68 year old female patient with chronic diastolic heart failure who failed outpatient diuretic therapy underwent a right heart catheterization on 5/29 showing markedly elevated biventricular filling pressures and had a subsequent PEA arrest on 5/30.  She was intubated and ROSC was  achieved.  She was started on CRRT on 5/31 for acute kidney injury.  She has had an ongoing pressor requirement.  1.  Acute kidney injury.  ATN 2/2 shock.  As of transfer of her care on 6/23, she had lost about 39 kg to date from CRRT.  Right subclavian tunneled catheter  - Continue on CRRT with goal keep even for now.  Reduced to 300 pre-filter; post filter is 300 as well.  Will plan to transition to intermittent HD when she clots or when cartridge expires  - 4 K fluids  - replace phos and trend renal profile   2.  Chronic kidney disease stage III with a baseline serum creatinine around 1.7-1.9.  CKD 2/2 DM and HTN.  Intact PTH 33 on 10/12/2018.  3.  Chronic respiratory failure.  Status post tracheostomy  4. Chronic diastolic heart failure.  Optimize volume with CRRT

## 2018-10-18 NOTE — Progress Notes (Signed)
Pt sat probe changed x3, sat waveform inconsistent. Pt having non labored breathing, RR 18, HR 59, BP 111/38 (58), ATC 40%- which she has been on for >24 hours. Pt trach cuff re-inflated to see if sat's would improve. No improvement. Sat 78-84%. ABG ordered to verify 02 sat.. RN will continue to monitor.

## 2018-10-19 ENCOUNTER — Ambulatory Visit: Payer: Medicare HMO | Admitting: Podiatry

## 2018-10-19 ENCOUNTER — Inpatient Hospital Stay (HOSPITAL_COMMUNITY): Payer: Medicare HMO

## 2018-10-19 LAB — RENAL FUNCTION PANEL
Albumin: 1.8 g/dL — ABNORMAL LOW (ref 3.5–5.0)
Albumin: 1.7 g/dL — ABNORMAL LOW (ref 3.5–5.0)
Anion gap: 8 (ref 5–15)
Anion gap: 8 (ref 5–15)
BUN: 27 mg/dL — ABNORMAL HIGH (ref 8–23)
BUN: 25 mg/dL — ABNORMAL HIGH (ref 8–23)
CO2: 25 mmol/L (ref 22–32)
CO2: 26 mmol/L (ref 22–32)
Calcium: 8 mg/dL — ABNORMAL LOW (ref 8.9–10.3)
Calcium: 7.8 mg/dL — ABNORMAL LOW (ref 8.9–10.3)
Chloride: 101 mmol/L (ref 98–111)
Chloride: 102 mmol/L (ref 98–111)
Creatinine, Ser: 1.27 mg/dL — ABNORMAL HIGH (ref 0.44–1.00)
Creatinine, Ser: 1.18 mg/dL — ABNORMAL HIGH (ref 0.44–1.00)
GFR calc Af Amer: 51 mL/min — ABNORMAL LOW (ref >60)
GFR calc Af Amer: 56 mL/min — ABNORMAL LOW (ref >60)
GFR calc non Af Amer: 44 mL/min — ABNORMAL LOW (ref >60)
GFR calc non Af Amer: 48 mL/min — ABNORMAL LOW (ref >60)
Glucose, Bld: 232 mg/dL — ABNORMAL HIGH (ref 70–99)
Glucose, Bld: 178 mg/dL — ABNORMAL HIGH (ref 70–99)
Phosphorus: 2.4 mg/dL — ABNORMAL LOW (ref 2.5–4.6)
Phosphorus: 3.5 mg/dL (ref 2.5–4.6)
Potassium: 4.8 mmol/L (ref 3.5–5.1)
Potassium: 4.7 mmol/L (ref 3.5–5.1)
Sodium: 134 mmol/L — ABNORMAL LOW (ref 135–145)
Sodium: 136 mmol/L (ref 135–145)

## 2018-10-19 LAB — CBC WITH DIFFERENTIAL/PLATELET
Abs Immature Granulocytes: 0.6 10*3/uL — ABNORMAL HIGH (ref 0.00–0.07)
Basophils Absolute: 0.1 10*3/uL (ref 0.0–0.1)
Basophils Relative: 0 %
Eosinophils Absolute: 0.3 10*3/uL (ref 0.0–0.5)
Eosinophils Relative: 1 %
HCT: 35.8 % — ABNORMAL LOW (ref 36.0–46.0)
Hemoglobin: 10.3 g/dL — ABNORMAL LOW (ref 12.0–15.0)
Immature Granulocytes: 2 %
Lymphocytes Relative: 7 %
Lymphs Abs: 1.8 10*3/uL (ref 0.7–4.0)
MCH: 27 pg (ref 26.0–34.0)
MCHC: 28.8 g/dL — ABNORMAL LOW (ref 30.0–36.0)
MCV: 94 fL (ref 80.0–100.0)
Monocytes Absolute: 2 10*3/uL — ABNORMAL HIGH (ref 0.1–1.0)
Monocytes Relative: 8 %
Neutro Abs: 21 10*3/uL — ABNORMAL HIGH (ref 1.7–7.7)
Neutrophils Relative %: 82 %
Platelets: 184 10*3/uL (ref 150–400)
RBC: 3.81 MIL/uL — ABNORMAL LOW (ref 3.87–5.11)
RDW: 29.1 % — ABNORMAL HIGH (ref 11.5–15.5)
WBC: 25.7 10*3/uL — ABNORMAL HIGH (ref 4.0–10.5)
nRBC: 0.3 % — ABNORMAL HIGH (ref 0.0–0.2)

## 2018-10-19 LAB — CULTURE, BLOOD (ROUTINE X 2)
Culture: NO GROWTH
Culture: NO GROWTH
Special Requests: ADEQUATE

## 2018-10-19 LAB — CULTURE, RESPIRATORY W GRAM STAIN
Culture: NORMAL
Gram Stain: NONE SEEN

## 2018-10-19 LAB — CORTISOL: Cortisol, Plasma: 23.2 ug/dL

## 2018-10-19 LAB — GLUCOSE, CAPILLARY
Glucose-Capillary: 116 mg/dL — ABNORMAL HIGH (ref 70–99)
Glucose-Capillary: 134 mg/dL — ABNORMAL HIGH (ref 70–99)
Glucose-Capillary: 153 mg/dL — ABNORMAL HIGH (ref 70–99)
Glucose-Capillary: 173 mg/dL — ABNORMAL HIGH (ref 70–99)
Glucose-Capillary: 181 mg/dL — ABNORMAL HIGH (ref 70–99)
Glucose-Capillary: 211 mg/dL — ABNORMAL HIGH (ref 70–99)
Glucose-Capillary: 215 mg/dL — ABNORMAL HIGH (ref 70–99)
Glucose-Capillary: 217 mg/dL — ABNORMAL HIGH (ref 70–99)

## 2018-10-19 LAB — MAGNESIUM: Magnesium: 2.7 mg/dL — ABNORMAL HIGH (ref 1.7–2.4)

## 2018-10-19 LAB — HEPARIN LEVEL (UNFRACTIONATED): Heparin Unfractionated: 0.34 IU/mL (ref 0.30–0.70)

## 2018-10-19 LAB — PROCALCITONIN: Procalcitonin: 0.29 ng/mL

## 2018-10-19 MED ORDER — CISATRACURIUM BESYLATE (PF) 10 MG/5ML IV SOLN: 0.1000 mg/kg | Freq: Once | INTRAVENOUS | Status: DC

## 2018-10-19 MED ORDER — WHITE PETROLATUM EX OINT
TOPICAL_OINTMENT | CUTANEOUS | Status: AC
  Administered 2018-10-19: 0.2
  Filled 2018-10-19: qty 28.35

## 2018-10-19 MED ORDER — SODIUM PHOSPHATES 45 MMOLE/15ML IV SOLN
20.0000 mmol | Freq: Once | INTRAVENOUS | Status: AC
  Administered 2018-10-19: 09:00:00 20 mmol via INTRAVENOUS
  Filled 2018-10-19: qty 6.67

## 2018-10-19 NOTE — Progress Notes (Signed)
Tried to place pt on trach collar. Pt sats dropped to 79%, was placed back on vent but is currently on PSV/CPAP 10/5. Will continue to monitor.

## 2018-10-19 NOTE — Progress Notes (Addendum)
NAME:  Kathleen [name], MRN:  [id number], DOB:  04-22-1951, LOS: 28 ADMISSION DATE:  09/22/2018, CONSULTATION DATE: 09/24/2018 REFERRING MD: Candee Furbish MD, CHIEF COMPLAINT: Cardiac arrest  Brief History   68 y/o obese woman with chronic diastolic heart failure who failed outpatient diuretics management due to chronic kidney disease.  Right heart cath on 5/29 showed markedly elevated biventricular filling pressures with normal cardiac output.  She was on cardiology service, maintained on milrinone and Lasix.  On the morning of 5/30 she went into PEA arrest, CPR performed for 8 minutes.  Intubated and transferred to ICU.  Started on CRRT 5/31.  Extubated 6/3.  Re-intubated early am 6/11 for hypercarbic respiratory failure and underwent tracheostomy on 6/19  Past Medical History   has a past medical history of Atrial flutter ([place]), Bradycardia, Chronic diastolic (congestive) heart failure ([place]) (01/10/2015), CKD (chronic kidney disease), stage IV ([place]), Degenerative joint disease of hand, Diabetes mellitus, Dyslipidemia, Fecal occult blood test positive, GERD (gastroesophageal reflux disease), Headache, Hypertension, Inadequate material resources, Irritable bowel syndrome, Morbid obesity ([place]), Obesity hypoventilation syndrome ([place]), Post-menopausal bleeding, and Shortness of breath dyspnea.  Significant Hospital Events   5/28 Admit 5/29 RHC 5/30 PEA arrest, intubated/ CRRT 5/31 Milrinone stopped due to ectopy; brady episode- amio stopped 6/02 Attempt at SBT, chest x-ray with worsening pulmonary edema, CVVHD for volume removal 6/03 Extubated > remain on CRRT.  Refused nocturnal BiPAP 6/04 Refused nocturnal BiPAP 6/10 off CRRT 6/11 Intubated early am with hypercarbia, concern for RUL PNA; abx broadened; restarted on CRRT 6/16  No events overnight, weaning on high PS 6/17  failed vent wean, remains on levophed, midodrine and CRRT 6/18  Weaned for 5 hours on PSV 8/5 before requiring full support;  ongoing CRRT, remains on levophed 14 mcg 6/19 - TRACH + Dr Erskine Emery 6/20 -. On IV heparin gt, levophed gtt with midodrine. Off fent gtt. Pressopr needs down  Consults:  Cardiology PCCM  Nephrology   Procedures:  Lt PICC 5/29 >> OETT 5/30 >> 6/3; 6/11 > 6/19 - TRACH (Dr Erskine Emery) >> R IJ HD cath 5/30 >> Aline left ulnar 5/30 >> 6/2  Significant Diagnostic Tests:  TTE 5/28 >>The left ventricle has normal systolic function with an ejection fraction of 60-65%. . The right ventricle has normal systolic function. The cavity was mildly enlarged.   Right heart cath 5/29 RA = 24 RV = 94/26 PA = 95/36 (53) PCW = 30 (v = 50) Fick cardiac output/index = 6.0/2.7 PVR =3.9 WU FA sat = 91% PA sat = 58%, 58% SVC 60%  CXR 6/15> Bilateral ASD LLL > R. ETT, GT, LIJ and RIJ CVCs stable position.   Micro Data:  SARS coronavirus 2 cepheid 5/28 >> neg MRSA PCR 5/28 >> neg Trach aspirate 6/2 >> MSSA BCx2 6/2 >> negative ...................... Tracheal aspirate 6/11 >> few GNR >> few proteus and few candida tropicalis BCx2 6/11 >> negative  Antimicrobials:  Vancomycin 6/3 > 6/4 Cefazolin 6/4 >> 6/10 ...........................Marland Kitchen Vanco 6/11 >> 6/17 Cefepime 6/11 >>6/18  Interim history/subjective:   Currently on full mechanical ventilatory support Sputum culture shows normal flora Requiring Levophed to maintain adequate blood pressure He is either wet on her chest x-ray and normotensive or hypotensive and dry on chest x-ray  Objective   Blood pressure 114/60, pulse (!) 57, temperature 98.4 F (36.9 C), temperature source Oral, resp. rate 20, height 5' (1.524 m), weight 100.3 kg, SpO2 100 %.    Vent Mode: PSV;CPAP FiO2 (%):  [  40 %-60 %] 50 % Set Rate:  [18 bmp] 18 bmp Vt Set:  [420 mL] 420 mL PEEP:  [5 cmH20] 5 cmH20 Pressure Support:  [10 cmH20] 10 cmH20 Plateau Pressure:  [16 cmH20-19 cmH20] 19 cmH20   Intake/Output Summary (Last 24 hours) at 10/19/2018 3810 Last data filed  at 10/19/2018 0700 Gross per 24 hour  Intake 1900.38 ml  Output 1680 ml  Net 220.38 ml   Filed Weights   10/17/18 0600 10/18/18 0500 10/19/18 0500  Weight: 99.8 kg 100.2 kg 100.3 kg    General: Morbidly obese female currently on full mechanical ventilatory support HEENT: The ostomy in place Neuro: Grossly intact CV: Neutrophil with bradycardia PULM: even/non-labored, lungs bilaterally diminished FB:PZWC, non-tender, bsx4 active  Extremities: warm/dry 3+ edema, ankle wraps in place Skin: no rashes or lesions    10/18/2018 no chest   Resolved Hospital Problem list    Assessment & Plan:  ASSESSMENT / PLAN:  PULMONARY A:  Baseline hx - OSA/OHV - non compliant  Acute on chronic resp failure following cardiac arrest- prolonged mech vent and s/p trach 10/14/2018  P:   Tolerated trach collar for approximately 24 hours on 10/18/2018 Currently back on pressure support ventilator support.  NEUROLOGIC A:   Acute Encephalopathy - resolved 6/19. Off fent gtt as of 10/15/2018   P:   Continue Seroquel PRN Versed as needed Low-dose Klonopin  VASCULAR A:   Circulatory shock - ?if she is still dry, pressor requirement improved after fluid bolus yesterday   P:  Continue midodrine 15 mg Back on Levophed Weaning dopamine Steroids have been weaned off   CARDIAC A: PEA arrest : -suspect hypoxia driven Acute on chronic diastolic heart failure --admit with elevated filling pressures, cardiorenal syndrome  Pulmonary hypertension, severe : -s/p RHC, WHO class 2,3 Permanent AF/AFl - -on anticoagulation pre admit NSVT/VT - -off amio due to bradycardia  P: Per heart failure team Mains on heparin drip she will most likely need permanent access for hemodialysis near future Early on low-dose dopamine for bradycardia which is being weaned She went back on Levophed during the night for hypotension spite being on midodrine  Check cortisol level 10/19/2018  INFECTIOUS A:   MSSA PNA   6/2 Proteus VAP 6/11 Procalcitonin 0.32 on 10/18/2018 White count 33.5 P:   Sputum culture culture from 6/22 shows gram-positive cocci consistent with normal flora Procalcitonin mildly elevated Holding off antimicrobial therapy at this time.  RENAL Lab Results  Component Value Date   CREATININE 1.27 (H) 10/19/2018   CREATININE 1.39 (H) 10/18/2018   CREATININE 1.38 (H) 10/18/2018   CREATININE 1.60 (H) 04/06/2016   CREATININE 0.94 06/07/2014   CREATININE 0.93 06/02/2013   Recent Labs  Lab 10/18/18 1622 10/18/18 1817 10/19/18 0349  K 4.9 4.6 4.8    A:  Cardiorenal syndrome AKI/ CKD 3-4, remains anuric P:  Remains on CRRT per nephrology Currently euvolemic If hemodynamic stable can transition to intermittent hemodialysis   ENDOCRINE CBG (last 3)  Recent Labs    10/19/18 0001 10/19/18 0402 10/19/18 0748  GLUCAP 181* 215* 116*   Filed Weights   10/17/18 0600 10/18/18 0500 10/19/18 0500  Weight: 99.8 kg 100.2 kg 100.3 kg    A:   Morbid OBestity DM-   P:   Continue Levemir 30 units twice daily Continue current tube feeding coverage Sliding scale insulin protocol resistant Note improving CBG  HEMATOLOGIC Recent Labs    10/18/18 1817 10/19/18 0349  HGB 12.6 10.3*  '  A:  At risk anemia critical illness P:  Transfuse per protocol    GI A:   Tolerting TF Constipation P:   Continue Procrit complement heparin Ongoing bowelregimen Swallow evaluation once she tolerates trach collar greater than 24 hours   MSK/DERM RT hip blister - 6/17 P Continue dressings for arranged  Best practice:  Diet: NPO for trach, will resume TF per cortrak , swallow evaluation near future note she has a tracheostomy in place and tolerating trach collar Pain/Anxiety/Delirium protocol (if indicated):  In place  VAP protocol (if indicated): In place  DVT prophylaxis: Heparin gtt GI prophylaxis: Pepcid Glucose control: SSI Mobility: Bed Code Status: Full Family  Communication: none Disposition: ICU   App CCT 30 min   Richardson Landry Minor ACNP Maryanna Shape PCCM Pager [phone] till 1 pm If no answer page [phone] 10/19/2018, 8:52 AM   PCCM Attending:   69 yo FM, chronically debilitated, prolonged ICU stay, BiV failure, h/o PEA arrest, requiring continued CVVHD. Was hypercarbic and failed extubation again leading to tracheostomy.   BP 114/60    Pulse (!) 57    Temp 98.4 F (36.9 C) (Oral)    Resp 20    Ht 5' (1.524 m)    Wt 100.3 kg    SpO2 100%    BMI 43.18 kg/m   Gen: elderly fm, chronically debilitated, trached, on vent  Heart: RRR, s1 s2, systolic murmur Lung: BL vented breaths  Abd: soft, nt nd   Labs reviewed  CXR BL pulmonary edema   A:  Biventricular failure, cardiogenic shock  Acute on chronic heart failure  Volume overload, managed with CVVHD  Chronic RV failure  OSA/OHS PNA, MSSA, Proteus treated  Cardiorenal syndrome, acute on chronic renal failure requiring CVVHD  P: Remains in the ICU on vent Tolerated PS trials this morning Can consider transition to TCT later today if tolerated  CVVHD for volume removal  cbgs with levemir and ssi  Weaning vasopressors, coming of dopamine first, still on NEpi Goal MAP >72mmHg   This patient is critically ill with multiple organ system failure; which, requires frequent high complexity decision making, assessment, support, evaluation, and titration of therapies. This was completed through the application of advanced monitoring technologies and extensive interpretation of multiple databases. During this encounter critical care time was devoted to patient care services described in this note for 32 minutes.   Garner Nash, DO [facility] 10/19/2018 9:53 AM  Personal pager: [phone] If unanswered, please page CCM On-call: [phone]

## 2018-10-19 NOTE — Progress Notes (Addendum)
Patient ID: Kathleen [name], female   DOB: July 16, 1950, 68 y.o.   MRN: [id number]    Advanced Heart Failure Rounding Note   Subjective:     Was on trach collar for 24 hours but desatted overnight and placed back on vent. SBP dropped into 70s and NE restarted at 2. NE now off. On dopa at 3  HRs in the 40s    Objective:   Weight Range:  Vital Signs:   Temp:  [98 F (36.7 C)-98.7 F (37.1 C)] 98.5 F (36.9 C) (06/24 0400) Pulse Rate:  [45-69] 58 (06/24 0600) Resp:  [14-27] 21 (06/24 0600) BP: (73-115)/(21-71) 97/49 (06/24 0600) SpO2:  [80 %-100 %] 100 % (06/24 0600) FiO2 (%):  [40 %-60 %] 60 % (06/24 0400) Weight:  [100.3 kg] 100.3 kg (06/24 0500) Last BM Date: 10/15/18  Weight change: Filed Weights   10/17/18 0600 10/18/18 0500 10/19/18 0500  Weight: 99.8 kg 100.2 kg 100.3 kg    Intake/Output:   Intake/Output Summary (Last 24 hours) at 10/19/2018 0737 Last data filed at 10/19/2018 0700 Gross per 24 hour  Intake 1962.45 ml  Output 1739 ml  Net 223.45 ml     Physical Exam: General:  Lying in bed back on vent NAD HEENT: normal Neck: supple. + trach  no JVD. Carotids 2+ bilat; no bruits. No lymphadenopathy or thryomegaly appreciated. Cor: PMI nondisplaced. Loletha Grayer regular. No rubs, gallops or murmurs.  [facility] Lungs: clear Abdomen: abdomen soft, nontender, nondistended. No hepatosplenomegaly. No bruits or masses. Good bowel sounds. Extremities: no cyanosis, clubbing, rash, trace edema + boots Neuro: alert & orientedx3, cranial nerves grossly intact. moves all 4 extremities w/o difficulty. Affect pleasant    Telemetry: AF 40-50s. Personally reviewed   Labs: Basic Metabolic Panel: Recent Labs  Lab 10/15/18 0632  10/16/18 0518  10/17/18 0340 10/17/18 1644 10/18/18 0348 10/18/18 1622 10/18/18 1817 10/19/18 0349  NA 134*   < > 135   < > 132* 135 133* 135 135 134*  K 4.1   < > 4.3   < > 4.2 4.5 4.2 4.9 4.6 4.8  CL 101   < > 103   < > 103 102 101 101  --  101   CO2 23   < > 24   < > _0 --  25  GLUCOSE 169*   < > 258*   < > 230* 136* 197* 173*  --  232*  BUN 33*   < > 27*   < > 25* 29* 28* 25*  --  27*  CREATININE 1.35*   < > 1.23*   < > 1.13* 1.34* 1.38* 1.39*  --  1.27*  CALCIUM 8.2*   < > 7.8*   < > 7.5* 8.1* 7.8* 7.9*  --  8.0*  MG 2.6*  --  2.4  --  2.3  --  2.6*  --   --  2.7*  PHOS 2.6   < > 2.4*   < > 2.5 2.6 2.4* 4.2  --  2.4*   < > = values in this interval not displayed.    Liver Function Tests: Recent Labs  Lab 10/13/18 0259  10/17/18 0340 10/17/18 1644 10/18/18 0348 10/18/18 1622 10/19/18 0349  AST 38  --   --   --   --   --   --   ALT 23  --   --   --   --   --   --  ALKPHOS 88  --   --   --   --   --   --   BILITOT 1.6*  --   --   --   --   --   --   PROT 7.5  --   --   --   --   --   --   ALBUMIN 2.4*   < > 1.8* 1.9* 1.7* 1.8* 1.8*   < > = values in this interval not displayed.   No results for input(s): LIPASE, AMYLASE in the last 168 hours. No results for input(s): AMMONIA in the last 168 hours.  CBC: Recent Labs  Lab 10/15/18 0107 10/16/18 0518 10/17/18 0340 10/18/18 0348 10/18/18 1817 10/19/18 0349  WBC 22.1* 37.8* 36.2* 33.5*  --  25.7*  NEUTROABS  --   --   --   --   --  21.0*  HGB 9.9* 9.9* 9.8* 9.7* 12.6 10.3*  HCT 32.6* 34.6* 34.4* 34.6* 37.0 35.8*  MCV 91.8 92.0 93.7 93.5  --  94.0  PLT 73* 146* 141* 154  --  184    Cardiac Enzymes: Recent Labs  Lab 10/13/18 0259 10/15/18 0053 10/15/18 0632 10/15/18 1238  TROPONINI 0.05* 0.04* 0.04* 0.04*    BNP: BNP (last 3 results) Recent Labs    09/22/18 1420  BNP 451.2*    ProBNP (last 3 results) No results for input(s): PROBNP in the last 8760 hours.    Other results:  Imaging: Dg Chest Port 1 View  Result Date: 10/19/2018 CLINICAL DATA:  Respiratory failure. EXAM: PORTABLE CHEST 1 VIEW COMPARISON:  Radiograph of October 16, 2018. FINDINGS: Stable cardiomegaly with central pulmonary vascular congestion. Stable position of  tracheostomy and feeding tube. Right internal jugular catheter is unchanged in position. Left subclavian catheter is unchanged in position. Stable probable bilateral pulmonary edema. No significant pleural effusion is noted. No pneumothorax is noted. Bony thorax is unremarkable. IMPRESSION: Stable cardiomegaly with central pulmonary vascular congestion and probable bilateral pulmonary edema. Stable support apparatus. Electronically Signed   By: Marijo Conception M.D.   On: 10/19/2018 07:27     Medications:     Scheduled Medications: . aspirin  81 mg Per Tube Daily  . atorvastatin  40 mg Oral q1800  . chlorhexidine gluconate (MEDLINE KIT)  15 mL Mouth Rinse BID  . Chlorhexidine Gluconate Cloth  6 each Topical Daily  . feeding supplement (PRO-STAT SUGAR FREE 64)  30 mL Per Tube TID  . insulin aspart  0-20 Units Subcutaneous Q4H  . insulin detemir  30 Units Subcutaneous BID  . mouth rinse  15 mL Mouth Rinse 10 times per day  . midodrine  15 mg Oral TID WC  . polyethylene glycol  17 g Oral Daily  . sodium chloride flush  10-40 mL Intracatheter Q12H    Infusions: .  prismasol BGK 4/2.5 300 mL/hr at 10/19/18 0607  .  prismasol BGK 4/2.5 300 mL/hr at 10/19/18 0409  . sodium chloride 10 mL/hr at 10/19/18 0700  . DOPamine 3 mcg/kg/min (10/19/18 0700)  . famotidine (PEPCID) IV 20 mg (10/18/18 1415)  . feeding supplement (VITAL 1.5 CAL) 1,000 mL (10/18/18 1743)  . heparin 1,200 Units/hr (10/19/18 0700)  . norepinephrine (LEVOPHED) Adult infusion Stopped (10/19/18 0656)  . prismasol BGK 4/2.5 1,500 mL/hr at 10/18/18 2136  . sodium chloride 999 mL/hr at 10/06/18 1018  . sodium phosphate  Dextrose 5% IVPB      PRN Medications: sodium chloride, acetaminophen (TYLENOL) oral  liquid 160 mg/5 mL, bisacodyl, docusate, heparin, midazolam, ondansetron, sodium chloride, sodium chloride flush   Assessment/Plan:   1. PEA arrest on 5/30 - due to hypoxia/pulmonary edema. - now stable  2. Acute on  chronic hypoxic respiratory failure - has underlying  OHS/OSA  - intubated on 5/30 in setting of arrest. extubated 6/3.reintubated 6/11 with worsening RUL aispace disease.  - treated for MSSA PNA - completed cefipime/vanc 6/18 - s/p trach 6/19. Was on trach collar for 24 hours but desatted. Now back on vent. D/w RT hopefully can wean again soon. Discussed with RT, Needs aggressive PT and pulmonary toilet.  - CXR with persistent edema (perhaps slightly worse) but not pulling more on CVVHD due to low BP.  - Management per CCM   3.  Acute on chronic diastolic HF - Admitted with markedly elevated filling pressures complicated by severe cardiorenal syndrome - Now down 80+ pounds with CVVHD. - Volume management per Renal and CVVHD.Volume status stable overnight. Running even to slightly positive on CVVHD to help support BP   4. Pulmonary HTN, severe  - this is mostly pulmonary venous HTN by cath [facility] 2) but also has a component of OHS/OSA (WHO group 3). Improved with volume removal.  - no role for selective pulmonary vasodilators  5. AKI on CKD 3 - baseline creatinine 1.9 -> ESRD - Anuric. Remains on CVVHD. Renal managing - Hopefully we can get on iHD in near future - though I am concerned about her ability to tolerate with low BP - Continue midodrine 15 tid. Wean NE & dopa as tolerted  6. Morbid obesity - needs weight loss  6. Chronic AF/AFL with slow VR and symptomatic bradycardia - Off apixaban. Now on heparin. No bleeding. Discussed dosing with PharmD personally. - Started on dopamine for sustained rates in 30s last night. VR back up to 40-50 range. Will try to wean dopa off today - Ideally would probably benefit from PPM but poor candidate given co-morbidities and recent HD start.  - EP has seen and determined not to be a PPM candidate  7. VT/NSVT - Quiescent.  off amio (for 3 weeks) due to bradycardia - Keep K> 4.0 Mg 2.0  8. MSSA PNA - Bcx drawn 6/2 & 6/11. NGTD -  sputum cx with MSSA PNA -Treated with cefipime/vanc - course completed 6/18. No infectious x currently. WBC dropping 36K -> 33K -> 25. PCT low and falling.   9. Hypotension - Persists - Now on midodrine 15 tid. Off/on NE.  - We wil try to wean dopamine slowly. Keep SBP >= 90 HR > 40 - This has been a major barrier  - Can consider Florinef but will need to watch fluid status. Northera also an option. Will d/w Renal  10. Severe debility - needs PT  CRITICAL CARE Performed by: Glori Bickers  Total critical care time: 35 minutes  Critical care time was exclusive of separately billable procedures and treating other patients.  Critical care was necessary to treat or prevent imminent or life-threatening deterioration.  Critical care was time spent personally by me (independent of midlevel providers or residents) on the following activities: development of treatment plan with patient and/or surrogate as well as nursing, discussions with consultants, evaluation of patient's response to treatment, examination of patient, obtaining history from patient or surrogate, ordering and performing treatments and interventions, ordering and review of laboratory studies, ordering and review of radiographic studies, pulse oximetry and re-evaluation of patient's condition.  Length of Stay: 46  Glori Bickers MD 10/19/2018, 7:37 AM  Advanced Heart Failure Team Pager [phone] (M-F; 7a - 4p)  Please contact [facility] for night-coverage after hours (4p -7a ) and weekends on [email or web address]

## 2018-10-19 NOTE — Progress Notes (Signed)
[facility] for Heparin Indication: atrial fibrillation  Allergies  Allergen Reactions  . Doxycycline     REACTION: Wheals and pruritus    Patient Measurements: Height: 5' (152.4 cm) Weight: 221 lb 1.9 oz (100.3 kg) IBW/kg (Calculated) : 45.5  HEPARIN DW (KG): 71.8  Vital Signs: Temp: 98.5 F (36.9 C) (06/24 0400) Temp Source: Oral (06/24 0400) BP: 103/65 (06/24 0500) Pulse Rate: 61 (06/24 0500)  Labs: Recent Labs    10/17/18 0340  10/18/18 0348 10/18/18 1622 10/18/18 1817 10/19/18 0349 10/19/18 0422  HGB 9.8*  --  9.7*  --  12.6 10.3*  --   HCT 34.4*  --  34.6*  --  37.0 35.8*  --   PLT 141*  --  154  --   --  184  --   HEPARINUNFRC 0.33  --  0.35  --   --   --  0.34  CREATININE 1.13*   < > 1.38* 1.39*  --  1.27*  --    < > = values in this interval not displayed.    Estimated Creatinine Clearance: 46.4 mL/min (A) (by C-G formula based on SCr of 1.27 mg/dL (H)).  Assessment: 68 y.o. female with h/o Afib, (PTA Eliquis on hold) s/p trach placement.  Pharmacy has been consulted to dose heparin.   Heparin level remains therapeutic this morning at 0.34. Hgb trending down from previous at 10.3, but platelet count WNL with no signs of bleeding noted. No issues with the infusion overnight per RN.   Goal of Therapy:  Heparin level 0.3-0.7 units/ml  Monitor platelets by anticoagulation protocol: Yes   Plan:  Continue Heparin 1200 units/hr Daily CBC + heparin level while on heparin Monitor for s/sx bleeding F/u restarting oral anticoagulant when able   Brendolyn Patty, PharmD PGY1 Pharmacy Resident Phone [phone]  10/19/2018   6:40 AM

## 2018-10-19 NOTE — Progress Notes (Addendum)
[name]    NEPHROLOGY PROGRESS NOTE  SUBJECTIVE: Seen on CRRT.  98/47 and HR 60.  Right subcl tunneled catheter.  She had 1.7 liters UF via CRRT over 6/23 charted.  Anuric  Back on low dose levophed overnight. RN is weaning to 2 mcg/min now.  Continues on dopapmine for bradycardia per nursing.  Desat yesterday and placed back on vent; before had been on TCT x 24 hours per nursing.   Review of systems:  Denies n/v Denies air hunger Endorses constipation  - bowel regimen was initiated per nursing report   OBJECTIVE:  Vitals:   10/19/18 0400 10/19/18 0500  BP: (!) 73/35 103/65  Pulse: (!) 55 61  Resp: (!) 22 17  Temp: 98.5 F (36.9 C)   SpO2: 100% 100%    Intake/Output Summary (Last 24 hours) at 10/19/2018 1696 Last data filed at 10/19/2018 0600 Gross per 24 hour  Intake 1954.79 ml  Output 1716 ml  Net 238.79 ml      General: adult female in bed critically ill on vent via trach  HEENT: MMM [place] AT anicteric sclera Neck:  positive tracheostomy CV:  RRR  Lungs:  Coarse mechanical breath sounds  Abd:  abd SNT/ND Extremities:Trace bilateral LE edema. Access: right subclavian tunneled catheter   MEDICATIONS:  . aspirin  81 mg Per Tube Daily  . atorvastatin  40 mg Oral q1800  . chlorhexidine gluconate (MEDLINE KIT)  15 mL Mouth Rinse BID  . Chlorhexidine Gluconate Cloth  6 each Topical Daily  . feeding supplement (PRO-STAT SUGAR FREE 64)  30 mL Per Tube TID  . insulin aspart  0-20 Units Subcutaneous Q4H  . insulin detemir  30 Units Subcutaneous BID  . mouth rinse  15 mL Mouth Rinse 10 times per day  . midodrine  15 mg Oral TID WC  . polyethylene glycol  17 g Oral Daily  . sodium chloride flush  10-40 mL Intracatheter Q12H       LABS:   CBC Latest Ref Rng & Units 10/19/2018 10/18/2018 10/18/2018  WBC 4.0 - 10.5 K/uL 25.7(H) - 33.5(H)  Hemoglobin 12.0 - 15.0 g/dL 10.3(L) 12.6 9.7(L)  Hematocrit 36.0 - 46.0 % 35.8(L) 37.0 34.6(L)  Platelets 150 - 400  K/uL 184 - 154    CMP Latest Ref Rng & Units 10/19/2018 10/18/2018 10/18/2018  Glucose 70 - 99 mg/dL 232(H) - 173(H)  BUN 8 - 23 mg/dL 27(H) - 25(H)  Creatinine 0.44 - 1.00 mg/dL 1.27(H) - 1.39(H)  Sodium 135 - 145 mmol/L 134(L) 135 135  Potassium 3.5 - 5.1 mmol/L 4.8 4.6 4.9  Chloride 98 - 111 mmol/L 101 - 101  CO2 22 - 32 mmol/L 25 - 27  Calcium 8.9 - 10.3 mg/dL 8.0(L) - 7.9(L)  Total Protein 6.5 - 8.1 g/dL - - -  Total Bilirubin 0.3 - 1.2 mg/dL - - -  Alkaline Phos 38 - 126 U/L - - -  AST 15 - 41 U/L - - -  ALT 0 - 44 U/L - - -    Lab Results  Component Value Date   PTH 33 10/12/2018   CALCIUM 8.0 (L) 10/19/2018   CAION 1.17 10/18/2018   PHOS 2.4 (L) 10/19/2018       Component Value Date/Time   COLORURINE AMBER (A) 05/22/2013 1826   APPEARANCEUR HAZY (A) 05/22/2013 1826   LABSPEC 1.024 05/22/2013 1826   PHURINE 6.0 05/22/2013 1826   GLUCOSEU NEGATIVE 05/22/2013 1826   HGBUR NEGATIVE 05/22/2013 1826  BILIRUBINUR SMALL (A) 05/22/2013 1826   KETONESUR NEGATIVE 05/22/2013 1826   PROTEINUR 100 (A) 05/22/2013 1826   UROBILINOGEN 4.0 (H) 05/22/2013 1826   NITRITE NEGATIVE 05/22/2013 1826   LEUKOCYTESUR NEGATIVE 05/22/2013 1826      Component Value Date/Time   PHART 7.354 10/18/2018 1817   PCO2ART 48.5 (H) 10/18/2018 1817   PO2ART 45.0 (L) 10/18/2018 1817   HCO3 27.0 10/18/2018 1817   TCO2 28 10/18/2018 1817   ACIDBASEDEF 1.0 10/12/2018 0433   O2SAT 78.0 10/18/2018 1817       Component Value Date/Time   IRON 44 10/12/2018 1640   TIBC 379 10/12/2018 1640   IRONPCTSAT 12 10/12/2018 1640       ASSESSMENT/PLAN:    68 year old female patient with chronic diastolic heart failure who failed outpatient diuretic therapy underwent a right heart catheterization on 5/29 showing markedly elevated biventricular filling pressures and had a subsequent PEA arrest on 5/30.  She was intubated and ROSC was achieved.  She was started on CRRT on 5/31 for acute kidney injury.  She has had  an ongoing pressor requirement.  1.  Acute kidney injury.  ATN 2/2 shock.  As of transfer of her care on 6/23, she had lost about 39 kg to date with CRRT.  Right subclavian tunneled catheter  - Continue on CRRT with goal keep even for now.  Will plan to transition to trial of intermittent HD when she clots or when cartridge expires  - 4 K fluids  - replace phos and trend renal profile   2.  Chronic kidney disease stage III with a baseline serum creatinine around 1.7-1.9.  CKD 2/2 DM and HTN.  Intact PTH 33 on 10/12/2018.  3.  Chronic respiratory failure.  Status post tracheostomy  4. Chronic diastolic heart failure.  Optimize volume with renal replacement therapy.  Noted cardiology may w/u for possible amyloid  5. Normocytic anemia - stable and no acute indication for PRBC's.  Kathleen [name] 10/19/2018 6:26 am

## 2018-10-19 NOTE — Progress Notes (Signed)
Pt placed on trach collar at 1111, tolerating well at this time. SpO2 98%. Will continue to monitor.

## 2018-10-19 NOTE — Progress Notes (Addendum)
Physical Therapy Treatment/Re-eval Patient Details Name: Kathleen [name] MRN: [id number] DOB: Aug 17, 1950 Today's Date: 10/19/2018    History of Present Illness Kathleen [name] is a 69 y.o. female with history of permanent AF, hypertension, super morbid obesity, DM2, dyslipidemia, obesity hypoventilation syndrome (on home O2 with no OSA by prior PSG but showed nocturnal hypoxemia), CKD stage IV (baseline creatinine about 1.9), chronic diastolic CHF.  Admit for fluid overload. Also with pulmonary HTN.  PEA arrest 5/30, transfer to the ICU, extubater 6/3.  Intubated 10/06/18.  Trach on 10/14/18.      PT Comments    Pt admitted with above diagnosis. Pt currently with functional limitations due to baalnce and endurance deficits as well as due to multiple setbacks due to intubation and CRRT with pressors.  Pt is starting to get better now and is ready for PT.  VSS with trach collar at 40% FiO2.  Pt met 0/4 goals set originally due to medical issues therefore revised goals today. Pt exercised all 4 extremities today. Strength UEs grossly 3+/5 and LEs 3+/5. Will follow acutely.  Pt will benefit from skilled PT to increase their independence and safety with mobility to allow discharge to the venue listed below.    Follow Up Recommendations  CIR     Equipment Recommendations  None recommended by PT;Other (comment)(TBA)    Recommendations for Other Services Rehab consult     Precautions / Restrictions Precautions Precautions: Fall Precaution Comments: watch O2 sats.  CVVHD ( CRRT) Restrictions Weight Bearing Restrictions: No    Mobility  Bed Mobility               General bed mobility comments: nurse asked PT to do bed level eval only. Pt was on CRRT.   Transfers                 General transfer comment: TBA  Ambulation/Gait                 Stairs             Wheelchair Mobility    Modified Rankin (Stroke Patients Only)       Balance                                             Cognition Arousal/Alertness: Awake/alert Behavior During Therapy: WFL for tasks assessed/performed Overall Cognitive Status: Within Functional Limits for tasks assessed                                        Exercises General Exercises - Upper Extremity Shoulder Flexion: AROM;Both;10 reps;Supine Shoulder Horizontal ABduction: AROM;Both;10 reps;Supine Shoulder Horizontal ADduction: AROM;Both;10 reps;Supine Elbow Flexion: AROM;Both;10 reps;Supine Elbow Extension: AROM;Both;10 reps;Supine General Exercises - Lower Extremity Ankle Circles/Pumps: AROM;Both;10 reps;Supine Quad Sets: AROM;Both;10 reps;Supine Short Arc Quad: AROM;Both;10 reps;Supine Heel Slides: AROM;Both;10 reps;Supine Hip ABduction/ADduction: AROM;Both;10 reps;Supine    General Comments        Pertinent Vitals/Pain Pain Assessment: No/denies pain    Home Living                      Prior Function            PT Goals (current goals can now be found in the care plan section) Acute Rehab PT  Goals Patient Stated Goal: to go home PT Goal Formulation: With patient Time For Goal Achievement: 11/02/18 Potential to Achieve Goals: Good Progress towards PT goals: Not progressing toward goals - comment(has been on vent 14 days with no therapy. Revised goals)    Frequency    Min 3X/week      PT Plan Current plan remains appropriate    Co-evaluation              AM-PAC PT "6 Clicks" Mobility   Outcome Measure  Help needed turning from your back to your side while in a flat bed without using bedrails?: A Lot Help needed moving from lying on your back to sitting on the side of a flat bed without using bedrails?: A Lot Help needed moving to and from a bed to a chair (including a wheelchair)?: Total Help needed standing up from a chair using your arms (e.g., wheelchair or bedside chair)?: Total Help needed to walk in hospital room?:  Total Help needed climbing 3-5 steps with a railing? : Total 6 Click Score: 8    End of Session Equipment Utilized During Treatment: Gait belt;Oxygen Activity Tolerance: Patient tolerated treatment well Patient left: in bed;with call bell/phone within reach;with bed alarm set Nurse Communication: Mobility status;Need for lift equipment PT Visit Diagnosis: Muscle weakness (generalized) (M62.81);Other abnormalities of gait and mobility (R26.89)     Time: 4270-6237 PT Time Calculation (min) (ACUTE ONLY): 13 min  Charges:                        [place] Pager:  [phone]  Office:  [place] 10/19/2018, 2:56 PM

## 2018-10-20 DIAGNOSIS — I2781 Cor pulmonale (chronic): Secondary | ICD-10-CM

## 2018-10-20 LAB — GLUCOSE, CAPILLARY
Glucose-Capillary: 110 mg/dL — ABNORMAL HIGH (ref 70–99)
Glucose-Capillary: 146 mg/dL — ABNORMAL HIGH (ref 70–99)
Glucose-Capillary: 173 mg/dL — ABNORMAL HIGH (ref 70–99)
Glucose-Capillary: 173 mg/dL — ABNORMAL HIGH (ref 70–99)
Glucose-Capillary: 190 mg/dL — ABNORMAL HIGH (ref 70–99)
Glucose-Capillary: 70 mg/dL (ref 70–99)

## 2018-10-20 LAB — CBC WITH DIFFERENTIAL/PLATELET
Abs Immature Granulocytes: 0.47 10*3/uL — ABNORMAL HIGH (ref 0.00–0.07)
Basophils Absolute: 0 10*3/uL (ref 0.0–0.1)
Basophils Relative: 0 %
Eosinophils Absolute: 0.5 10*3/uL (ref 0.0–0.5)
Eosinophils Relative: 2 %
HCT: 33.3 % — ABNORMAL LOW (ref 36.0–46.0)
Hemoglobin: 9.4 g/dL — ABNORMAL LOW (ref 12.0–15.0)
Immature Granulocytes: 2 %
Lymphocytes Relative: 8 %
Lymphs Abs: 1.7 10*3/uL (ref 0.7–4.0)
MCH: 26.6 pg (ref 26.0–34.0)
MCHC: 28.2 g/dL — ABNORMAL LOW (ref 30.0–36.0)
MCV: 94.1 fL (ref 80.0–100.0)
Monocytes Absolute: 2.2 10*3/uL — ABNORMAL HIGH (ref 0.1–1.0)
Monocytes Relative: 10 %
Neutro Abs: 16.4 10*3/uL — ABNORMAL HIGH (ref 1.7–7.7)
Neutrophils Relative %: 78 %
Platelets: 180 10*3/uL (ref 150–400)
RBC: 3.54 MIL/uL — ABNORMAL LOW (ref 3.87–5.11)
RDW: 28.4 % — ABNORMAL HIGH (ref 11.5–15.5)
WBC: 21.3 10*3/uL — ABNORMAL HIGH (ref 4.0–10.5)
nRBC: 0.1 % (ref 0.0–0.2)

## 2018-10-20 LAB — RENAL FUNCTION PANEL
Albumin: 1.7 g/dL — ABNORMAL LOW (ref 3.5–5.0)
Albumin: 1.7 g/dL — ABNORMAL LOW (ref 3.5–5.0)
Anion gap: 8 (ref 5–15)
Anion gap: 6 (ref 5–15)
BUN: 23 mg/dL (ref 8–23)
BUN: 30 mg/dL — ABNORMAL HIGH (ref 8–23)
CO2: 25 mmol/L (ref 22–32)
CO2: 26 mmol/L (ref 22–32)
Calcium: 7.8 mg/dL — ABNORMAL LOW (ref 8.9–10.3)
Calcium: 8.2 mg/dL — ABNORMAL LOW (ref 8.9–10.3)
Chloride: 101 mmol/L (ref 98–111)
Chloride: 107 mmol/L (ref 98–111)
Creatinine, Ser: 1.45 mg/dL — ABNORMAL HIGH (ref 0.44–1.00)
Creatinine, Ser: 1.83 mg/dL — ABNORMAL HIGH (ref 0.44–1.00)
GFR calc Af Amer: 43 mL/min — ABNORMAL LOW (ref >60)
GFR calc Af Amer: 33 mL/min — ABNORMAL LOW (ref >60)
GFR calc non Af Amer: 37 mL/min — ABNORMAL LOW (ref >60)
GFR calc non Af Amer: 28 mL/min — ABNORMAL LOW (ref >60)
Glucose, Bld: 195 mg/dL — ABNORMAL HIGH (ref 70–99)
Glucose, Bld: 119 mg/dL — ABNORMAL HIGH (ref 70–99)
Phosphorus: 3.3 mg/dL (ref 2.5–4.6)
Phosphorus: 3.2 mg/dL (ref 2.5–4.6)
Potassium: 4.6 mmol/L (ref 3.5–5.1)
Potassium: 4.9 mmol/L (ref 3.5–5.1)
Sodium: 134 mmol/L — ABNORMAL LOW (ref 135–145)
Sodium: 139 mmol/L (ref 135–145)

## 2018-10-20 LAB — HEPARIN LEVEL (UNFRACTIONATED)
Heparin Unfractionated: 0.28 IU/mL — ABNORMAL LOW (ref 0.30–0.70)
Heparin Unfractionated: 0.44 IU/mL (ref 0.30–0.70)

## 2018-10-20 LAB — MAGNESIUM: Magnesium: 2.6 mg/dL — ABNORMAL HIGH (ref 1.7–2.4)

## 2018-10-20 MED ORDER — ALBUMIN HUMAN 25 % IV SOLN
25.0000 g | Freq: Once | INTRAVENOUS | Status: AC
  Administered 2018-10-21: 25 g via INTRAVENOUS
  Filled 2018-10-20: qty 50

## 2018-10-20 MED ORDER — MAGIC MOUTHWASH
2.0000 mL | Freq: Three times a day (TID) | ORAL | Status: DC
  Administered 2018-10-20 – 2018-10-22 (×8): 2 mL via ORAL
  Filled 2018-10-20 (×9): qty 5

## 2018-10-20 MED ORDER — DARBEPOETIN ALFA 40 MCG/0.4ML IJ SOSY
40.0000 ug | PREFILLED_SYRINGE | Freq: Once | INTRAMUSCULAR | Status: AC
  Administered 2018-10-20: 40 ug via INTRAVENOUS
  Filled 2018-10-20: qty 0.4

## 2018-10-20 NOTE — Progress Notes (Signed)
[place] Progress Note Patient Name: Kathleen [name] DOB: [birth year] MRN: [id number]   Date of Service  10/20/2018  HPI/Events of Note  Persistent hypotension despite multiple pressors. Albumin of 1.7 g  eICU Interventions  Will trial 25 gm of 25 % Albumin x 1 to see if it improves her blood pressure        Carolie Mcilrath U Porchea Charrier 10/20/2018, 11:56 PM

## 2018-10-20 NOTE — Progress Notes (Signed)
Ventilator not needed at this time. Currently on trach collar

## 2018-10-20 NOTE — Progress Notes (Addendum)
[name]    NEPHROLOGY PROGRESS NOTE  SUBJECTIVE: Seen on CRRT.  92/69 and HR 50.  Right subcl tunneled catheter.  She had 1.9 liters UF via CRRT over 6/24 charted.  Anuric.  She has been on levophed currently 3 mcg/min.  She is off of dopamine.    Review of systems:  Denies n/v Denies air hunger No pain   OBJECTIVE:  Vitals:   10/20/18 0510 10/20/18 0600  BP: (!) 104/40 (!) 81/36  Pulse: (!) 51 64  Resp: (!) 21 13  Temp:    SpO2: 94% (!) 70%    Intake/Output Summary (Last 24 hours) at 10/20/2018 0636 Last data filed at 10/20/2018 0600 Gross per 24 hour  Intake 2004.73 ml  Output 1937 ml  Net 67.73 ml      General: adult female in bed on vent via trach  HEENT: MMM [place] AT anicteric sclera Neck:  positive tracheostomy CV:  Bradycardic  Lungs:  Coarse mechanical breath sounds  Abd:  abd SNT/ND Extremities:no pitting edema  Access: right subclavian tunneled catheter   MEDICATIONS:  . aspirin  81 mg Per Tube Daily  . atorvastatin  40 mg Oral q1800  . chlorhexidine gluconate (MEDLINE KIT)  15 mL Mouth Rinse BID  . Chlorhexidine Gluconate Cloth  6 each Topical Daily  . feeding supplement (PRO-STAT SUGAR FREE 64)  30 mL Per Tube TID  . insulin aspart  0-20 Units Subcutaneous Q4H  . insulin detemir  30 Units Subcutaneous BID  . mouth rinse  15 mL Mouth Rinse 10 times per day  . midodrine  15 mg Oral TID WC  . polyethylene glycol  17 g Oral Daily  . sodium chloride flush  10-40 mL Intracatheter Q12H       LABS:   CBC Latest Ref Rng & Units 10/20/2018 10/19/2018 10/18/2018  WBC 4.0 - 10.5 K/uL 21.3(H) 25.7(H) -  Hemoglobin 12.0 - 15.0 g/dL 9.4(L) 10.3(L) 12.6  Hematocrit 36.0 - 46.0 % 33.3(L) 35.8(L) 37.0  Platelets 150 - 400 K/uL 180 184 -    CMP Latest Ref Rng & Units 10/20/2018 10/19/2018 10/19/2018  Glucose 70 - 99 mg/dL 195(H) 178(H) 232(H)  BUN 8 - 23 mg/dL 23 25(H) 27(H)  Creatinine 0.44 - 1.00 mg/dL 1.45(H) 1.18(H) 1.27(H)  Sodium 135 - 145  mmol/L 134(L) 136 134(L)  Potassium 3.5 - 5.1 mmol/L 4.6 4.7 4.8  Chloride 98 - 111 mmol/L 101 102 101  CO2 22 - 32 mmol/L _0 Calcium 8.9 - 10.3 mg/dL 7.8(L) 7.8(L) 8.0(L)  Total Protein 6.5 - 8.1 g/dL - - -  Total Bilirubin 0.3 - 1.2 mg/dL - - -  Alkaline Phos 38 - 126 U/L - - -  AST 15 - 41 U/L - - -  ALT 0 - 44 U/L - - -    Lab Results  Component Value Date   PTH 33 10/12/2018   CALCIUM 7.8 (L) 10/20/2018   CAION 1.17 10/18/2018   PHOS 3.3 10/20/2018       Component Value Date/Time   COLORURINE AMBER (A) 05/22/2013 1826   APPEARANCEUR HAZY (A) 05/22/2013 1826   LABSPEC 1.024 05/22/2013 1826   PHURINE 6.0 05/22/2013 1826   GLUCOSEU NEGATIVE 05/22/2013 1826   HGBUR NEGATIVE 05/22/2013 1826   BILIRUBINUR SMALL (A) 05/22/2013 1826   KETONESUR NEGATIVE 05/22/2013 1826   PROTEINUR 100 (A) 05/22/2013 1826   UROBILINOGEN 4.0 (H) 05/22/2013 1826   NITRITE NEGATIVE 05/22/2013 1826   LEUKOCYTESUR NEGATIVE  05/22/2013 1826      Component Value Date/Time   PHART 7.354 10/18/2018 1817   PCO2ART 48.5 (H) 10/18/2018 1817   PO2ART 45.0 (L) 10/18/2018 1817   HCO3 27.0 10/18/2018 1817   TCO2 28 10/18/2018 1817   ACIDBASEDEF 1.0 10/12/2018 0433   O2SAT 78.0 10/18/2018 1817       Component Value Date/Time   IRON 44 10/12/2018 1640   TIBC 379 10/12/2018 1640   IRONPCTSAT 12 10/12/2018 1640       ASSESSMENT/PLAN:    68 year old female patient with chronic diastolic heart failure who failed outpatient diuretic therapy underwent a right heart catheterization on 5/29 showing markedly elevated biventricular filling pressures and had a subsequent PEA arrest on 5/30.  She was intubated and ROSC was achieved.  She was started on CRRT on 5/31 for acute kidney injury.  She has had an ongoing pressor requirement.  1.  Acute kidney injury.  ATN 2/2 shock.  As of transfer of her care on 6/23, she had lost about 39 kg to date with CRRT.  Right subclavian tunneled catheter  - Continue on  CRRT with goal keep even for now.  Stop when her cartridge expires later today and reassess.  Will plan to transition to trial of intermittent HD with first tx 6/27  - 4 K fluids   2.  Chronic kidney disease stage IV with a reported baseline serum creatinine around 1.7-1.9.  CKD 2/2 DM and HTN.  Intact PTH 33 on 10/12/2018.   3.  Chronic hypoxic respiratory failure.  Status post tracheostomy  4. Chronic diastolic heart failure.  Optimize volume with renal replacement therapy.  Noted cardiology may w/u for possible amyloid  5. Normocytic anemia - stable and no acute indication for PRBC's.  Aranesp 40 mcg once 6/25  6. Bradycardia - now off of dopamine   Kathleen [name] 10/20/2018  6:36 AM

## 2018-10-20 NOTE — Progress Notes (Signed)
[facility] for Heparin Indication: atrial fibrillation  Allergies  Allergen Reactions  . Doxycycline     REACTION: Wheals and pruritus    Patient Measurements: Height: 5' (152.4 cm) Weight: 219 lb 12.8 oz (99.7 kg) IBW/kg (Calculated) : 45.5  HEPARIN DW (KG): 71.8  Vital Signs: Temp: 98.5 F (36.9 C) (06/25 1507) Temp Source: Oral (06/25 1507) BP: 108/47 (06/25 1900) Pulse Rate: 58 (06/25 1900)  Labs: Recent Labs    10/18/18 0348  10/18/18 1817 10/19/18 0349 10/19/18 0422 10/19/18 1645 10/20/18 0417 10/20/18 1733  HGB 9.7*  --  12.6 10.3*  --   --  9.4*  --   HCT 34.6*  --  37.0 35.8*  --   --  33.3*  --   PLT 154  --   --  184  --   --  180  --   HEPARINUNFRC 0.35  --   --   --  0.34  --  0.28* 0.44  CREATININE 1.38*   < >  --  1.27*  --  1.18* 1.45* 1.83*   < > = values in this interval not displayed.    Estimated Creatinine Clearance: 32.1 mL/min (A) (by C-G formula based on SCr of 1.83 mg/dL (H)).  Assessment: 68 y.o. female with h/o Afib, (PTA Eliquis on hold) s/p trach placement.  Pharmacy has been consulted to dose heparin.   Heparin level is subtherapeutic this morning at 0.28. Hgb trending down, but platelet count remains WNL with no signs of bleeding noted.   Heparin level this evening within goal range.  No overt bleeding or complications noted.   Goal of Therapy:  Heparin level 0.3-0.7 units/ml  Monitor platelets by anticoagulation protocol: Yes   Plan:  Continue IV heparin at current rate. Daily CBC and heparin level while on heparin Monitor for s/sx bleeding F/u restarting oral anticoagulant if able  [place] Crane, Roylene Reason, [place] Pharmacist Phone [phone]  10/20/2018 7:19 PM

## 2018-10-20 NOTE — Progress Notes (Addendum)
NAME:  Kathleen [name], MRN:  [id number], DOB:  09-Dec-1950, LOS: 35 ADMISSION DATE:  09/22/2018, CONSULTATION DATE: 09/24/2018 REFERRING MD: Candee Furbish MD, CHIEF COMPLAINT: Cardiac arrest  Brief History   68 y/o obese woman with chronic diastolic heart failure who failed outpatient diuretics management due to chronic kidney disease.  Right heart cath on 5/29 showed markedly elevated biventricular filling pressures with normal cardiac output.  She was on cardiology service, maintained on milrinone and Lasix.  On the morning of 5/30 she went into PEA arrest, CPR performed for 8 minutes.  Intubated and transferred to ICU.  Started on CRRT 5/31.  Extubated 6/3.  Re-intubated early am 6/11 for hypercarbic respiratory failure and underwent tracheostomy on 6/19  Past Medical History   has a past medical history of Atrial flutter ([place]), Bradycardia, Chronic diastolic (congestive) heart failure ([place]) (01/10/2015), CKD (chronic kidney disease), stage IV ([place]), Degenerative joint disease of hand, Diabetes mellitus, Dyslipidemia, Fecal occult blood test positive, GERD (gastroesophageal reflux disease), Headache, Hypertension, Inadequate material resources, Irritable bowel syndrome, Morbid obesity ([place]), Obesity hypoventilation syndrome ([place]), Post-menopausal bleeding, and Shortness of breath dyspnea.  Significant Hospital Events   5/28 Admit 5/29 RHC 5/30 PEA arrest, intubated/ CRRT 5/31 Milrinone stopped due to ectopy; brady episode- amio stopped 6/02 Attempt at SBT, chest x-ray with worsening pulmonary edema, CVVHD for volume removal 6/03 Extubated > remain on CRRT.  Refused nocturnal BiPAP 6/04 Refused nocturnal BiPAP 6/10 off CRRT 6/11 Intubated early am with hypercarbia, concern for RUL PNA; abx broadened; restarted on CRRT 6/16  No events overnight, weaning on high PS 6/17  failed vent wean, remains on levophed, midodrine and CRRT 6/18  Weaned for 5 hours on PSV 8/5 before requiring full support;  ongoing CRRT, remains on levophed 14 mcg 6/19 - TRACH + Dr Erskine Emery 6/20 -. On IV heparin gt, levophed gtt with midodrine. Off fent gtt. Pressopr needs down  Consults:  Cardiology PCCM  Nephrology   Procedures:  Lt PICC 5/29 >> OETT 5/30 >> 6/3; 6/11 > 6/19 - TRACH (Dr Erskine Emery) >> R IJ HD cath 5/30 >> Aline left ulnar 5/30 >> 6/2  Significant Diagnostic Tests:  TTE 5/28 >>The left ventricle has normal systolic function with an ejection fraction of 60-65%. . The right ventricle has normal systolic function. The cavity was mildly enlarged.   Right heart cath 5/29 RA = 24 RV = 94/26 PA = 95/36 (53) PCW = 30 (v = 50) Fick cardiac output/index = 6.0/2.7 PVR =3.9 WU FA sat = 91% PA sat = 58%, 58% SVC 60%  CXR 6/15> Bilateral ASD LLL > R. ETT, GT, LIJ and RIJ CVCs stable position.   Micro Data:  SARS coronavirus 2 cepheid 5/28 >> neg MRSA PCR 5/28 >> neg Trach aspirate 6/2 >> MSSA BCx2 6/2 >> negative ...................... Tracheal aspirate 6/11 >> few GNR >> few proteus and few candida tropicalis BCx2 6/11 >> negative  Antimicrobials:  Vancomycin 6/3 > 6/4 Cefazolin 6/4 >> 6/10 ...........................Marland Kitchen Vanco 6/11 >> 6/17 Cefepime 6/11 >>6/18  Interim history/subjective:   Currently tolerating trach collar Awake alert very interactive Weaning Levophed  Objective   Blood pressure (!) 93/37, pulse (!) 51, temperature (!) 97.5 F (36.4 C), temperature source Oral, resp. rate 19, height 5' (1.524 m), weight 99.7 kg, SpO2 98 %. CVP:  [6 mmHg-8 mmHg] 8 mmHg  FiO2 (%):  [40 %] 40 %   Intake/Output Summary (Last 24 hours) at 10/20/2018 0851 Last data filed at 10/20/2018  9735 Gross per 24 hour  Intake 2073.09 ml  Output 1939 ml  Net 134.09 ml   Filed Weights   10/18/18 0500 10/19/18 0500 10/20/18 0500  Weight: 100.2 kg 100.3 kg 99.7 kg    General: Morbidly obese female who is currently on trach collar HEENT: Trach collar in place t Neuro: Grossly intact  CV: Heart sounds are regular PULM: even/non-labored, lungs bilaterally diminished throughout HG:DJME, non-tender, bsx4 active  Extremities: warm/dry, 3+ edema  Skin: Extremities are wrapped     10/19/2018 chest x-ray reviewed no acute changes 10/20/2018 no chest x-ray   Resolved Hospital Problem list    Assessment & Plan:  ASSESSMENT / PLAN:  PULMONARY A:  Baseline hx - OSA/OHV - non compliant  Acute on chronic resp failure following cardiac arrest- prolonged mech vent and s/p trach 10/14/2018  P:   Back on trach collar tip to remain on trach collar as tolerated  NEUROLOGIC A:   Acute Encephalopathy - resolved 6/19. Off fent gtt as of 10/15/2018   P:    Low-dose Klonopin  Seroquel PRN Versed as needed n  VASCULAR A:   Circulatory shock - ?if she is still dry, pressor requirement improved after fluid bolus yesterday   P:  Continue midodrine  Wean Levophed Wean dopamine    CARDIAC A: PEA arrest : -suspect hypoxia driven Acute on chronic diastolic heart failure --admit with elevated filling pressures, cardiorenal syndrome  Pulmonary hypertension, severe : -s/p RHC, WHO class 2,3 Permanent AF/AFl - -on anticoagulation pre admit NSVT/VT - -off amio due to bradycardia  P: Per heart failure team Remains on heparin drip at this time Continue to wean low-dose dopamine for bradycardia Wean Levophed as tolerated Cortisol level in 10/19/2018 noted to be adequate  INFECTIOUS A:   MSSA PNA  6/2 Proteus VAP 6/11 Procalcitonin 0.32 on 10/18/2018 White count 33.5 P:   Currently hold on antimicrobial therapy  RENAL Lab Results  Component Value Date   CREATININE 1.45 (H) 10/20/2018   CREATININE 1.18 (H) 10/19/2018   CREATININE 1.27 (H) 10/19/2018   CREATININE 1.60 (H) 04/06/2016   CREATININE 0.94 06/07/2014   CREATININE 0.93 06/02/2013   Recent Labs  Lab 10/19/18 0349 10/19/18 1645 10/20/18 0417  K 4.8 4.7 4.6    A:  Cardiorenal syndrome AKI/ CKD  3-4, remains anuric P:  Remains on CRRT per nephrology.  Questionable transition to intermittent hemodialysis by this weekend Currently weaning Levophed   ENDOCRINE CBG (last 3)  Recent Labs    10/19/18 2357 10/20/18 0427 10/20/18 0722  GLUCAP 134* 190* 173*   Filed Weights   10/18/18 0500 10/19/18 0500 10/20/18 0500  Weight: 100.2 kg 100.3 kg 99.7 kg    A:   Morbid OBestity DM-   P:   Continue Levemir 30 units twice daily Continue current tube feeding coverage On scale insulin protocol resistant CBGs continue to improve  HEMATOLOGIC Recent Labs    10/19/18 0349 10/20/18 0417  HGB 10.3* 9.4*  ' A:  At risk anemia critical illness P:  Transfuse per protocol    GI A:   Tolerting TF Constipation P:    Continue bowel regimen Swallow evaluation when she tolerates trach collar greater than 24 hours   MSK/DERM RT hip blister - 6/17 P Continue dressings per nursing  Best practice:  Diet: NPO for trach, will resume TF per cortrak , swallow evaluation near future note she has a tracheostomy in place and tolerating trach collar Pain/Anxiety/Delirium protocol (if indicated):  In place  VAP protocol (if indicated): In place  DVT prophylaxis: Heparin gtt GI prophylaxis: Pepcid Glucose control: SSI Mobility: Bed Code Status: Full Family Communication: none 10/20/2018 patient updated at bedside Disposition: ICU   App CCT 30 min   Richardson Landry Minor ACNP Maryanna Shape PCCM Pager [phone] till 1 pm If no answer page [phone] 10/20/2018, 8:51 AM    PCCM attending:  68 year old female, chronically debilitated prolonged hospitalization, admitted for biventricular failure status post PEA arrest, requiring ongoing CVVHD.  Unfortunately patient has had multiple intubations eventually decision was made for tracheostomy tube placement.  Yesterday she did well for the first time on trach collar trial.  BP (!) 104/39   Pulse (!) 49   Temp (!) 97.5 F (36.4 C)  (Oral)   Resp 15   Ht 5' (1.524 m)   Wt 99.7 kg   SpO2 95%   BMI 42.93 kg/m   Gen: Elderly, chronically ill-appearing female, tracheostomy tube in place on trach collar trial. Neck: Trach in place, trachea midline Heart: Regular rate and rhythm, M4-W8, systolic murmur left lower sternal border Lung: Diminished bilaterally, few crackles in the right base as compared to the left Abdomen: Soft, nontender nondistended  Labs reviewed Chest x-ray with persistent pulmonary vascular congestion and bilateral pulmonary edema. The patient's images have been independently reviewed by me.    A: Acute on chronic systolic heart failure, biventricular failure Refractory cardiogenic shock being on and off vasopressor requirements. Volume overload, right ventricular failure cor pulmonale Cardiorenal syndrome requiring CVVHD for volume management Obese OSA/OHS Pneumonia, MSSA and Proteus status post treated with antimicrobials  P:  Patient remains in the intensive care unit.  At this time tolerating TCT CVVHD continued for today.  Plans to stop this in the afternoon per nephrology. Continue CBGs with Levemir and SSI Wean off vasopressors today.  We will continue to observe. She is getting Midodrine  I do think since she has had such a prolonged ICU stay we should watch her in the unit after at least 1-2 cycles of HD when she is transitioned before considering movement out of the unit. Continue early mobility, PT and OT. We appreciate their input.  This patient is critically ill with multiple organ system failure; which, requires frequent high complexity decision making, assessment, support, evaluation, and titration of therapies. This was completed through the application of advanced monitoring technologies and extensive interpretation of multiple databases. During this encounter critical care time was devoted to patient care services described in this note for 32 minutes.   Garner Nash, DO  [facility] 10/20/2018 10:14 AM  Personal pager: [phone] If unanswered, please page CCM On-call: [phone]

## 2018-10-20 NOTE — Progress Notes (Signed)
[facility] for Heparin Indication: atrial fibrillation  Allergies  Allergen Reactions  . Doxycycline     REACTION: Wheals and pruritus    Patient Measurements: Height: 5' (152.4 cm) Weight: 219 lb 12.8 oz (99.7 kg) IBW/kg (Calculated) : 45.5  HEPARIN DW (KG): 71.8  Vital Signs: Temp: 98.2 F (36.8 C) (06/25 0400) Temp Source: Oral (06/25 0400) BP: 81/36 (06/25 0600) Pulse Rate: 64 (06/25 0600)  Labs: Recent Labs    10/18/18 0348  10/18/18 1817 10/19/18 0349 10/19/18 0422 10/19/18 1645 10/20/18 0417  HGB 9.7*  --  12.6 10.3*  --   --  9.4*  HCT 34.6*  --  37.0 35.8*  --   --  33.3*  PLT 154  --   --  184  --   --  180  HEPARINUNFRC 0.35  --   --   --  0.34  --  0.28*  CREATININE 1.38*   < >  --  1.27*  --  1.18* 1.45*   < > = values in this interval not displayed.    Estimated Creatinine Clearance: 40.5 mL/min (A) (by C-G formula based on SCr of 1.45 mg/dL (H)).  Assessment: 69 y.o. female with h/o Afib, (PTA Eliquis on hold) s/p trach placement.  Pharmacy has been consulted to dose heparin.   Heparin level is subtherapeutic this morning at 0.28. Hgb trending down, but platelet count remains WNL with no signs of bleeding noted. No issues with the infusion overnight per RN.   Goal of Therapy:  Heparin level 0.3-0.7 units/ml  Monitor platelets by anticoagulation protocol: Yes   Plan:  Increase heparin to 1350 units/hr Heparin level in 6 hours Daily CBC and heparin level while on heparin Monitor for s/sx bleeding F/u restarting oral anticoagulant if able   Brendolyn Patty, PharmD PGY1 Pharmacy Resident Phone [phone]  10/20/2018   7:17 AM

## 2018-10-20 NOTE — Progress Notes (Signed)
Patient ID: Kathleen [name], female   DOB: 10-06-1950, 68 y.o.   MRN: [id number]    Advanced Heart Failure Rounding Note   Subjective:     Continues to wean from vent on trach. Dopamine stopped yesterday. HR in 50-60s.   On CVVHD. Anuric. Back on NE 4. Wide awake.   Denies SOB,orthopnea or PND    Objective:   Weight Range:  Vital Signs:   Temp:  [98 F (36.7 C)-99 F (37.2 C)] 98.2 F (36.8 C) (06/25 0400) Pulse Rate:  [43-64] 64 (06/25 0600) Resp:  [13-23] 13 (06/25 0600) BP: (81-128)/(33-84) 81/36 (06/25 0600) SpO2:  [70 %-100 %] 70 % (06/25 0600) FiO2 (%):  [40 %-50 %] 40 % (06/24 1952) Last BM Date: 10/19/18  Weight change: Filed Weights   10/17/18 0600 10/18/18 0500 10/19/18 0500  Weight: 99.8 kg 100.2 kg 100.3 kg    Intake/Output:   Intake/Output Summary (Last 24 hours) at 10/20/2018 0630 Last data filed at 10/20/2018 0600 Gross per 24 hour  Intake 2004.73 ml  Output 1937 ml  Net 67.73 ml     Physical Exam: General:  Lying in bed. On vent through trach HEENT: normal Neck: supple. + trach Hard to see JVD Carotids 2+ bilat; no bruits. No lymphadenopathy or thryomegaly appreciated. Cor: PMI nondisplaced. Irregular rate & rhythm. No rubs, gallops or murmurs. + R [place] PC Lungs: clear Abdomen: obese soft, nontender, nondistended. No hepatosplenomegaly. No bruits or masses. Good bowel sounds. Extremities: no cyanosis, clubbing, rash, tr edema + UNNA Neuro: alert & orientedx3, cranial nerves grossly intact. moves all 4 extremities w/o difficulty. Affect pleasant    Telemetry: AF 50s. Personally reviewed   Labs: Basic Metabolic Panel: Recent Labs  Lab 10/16/18 0518  10/17/18 0340  10/18/18 0348 10/18/18 1622 10/18/18 1817 10/19/18 0349 10/19/18 1645 10/20/18 0417  NA 135   < > 132*   < > 133* 135 135 134* 136 134*  K 4.3   < > 4.2   < > 4.2 4.9 4.6 4.8 4.7 4.6  CL 103   < > 103   < > 101 101  --  101 102 101  CO2 24   < > 23   < > 26 27  --  _0 GLUCOSE 258*   < > 230*   < > 197* 173*  --  232* 178* 195*  BUN 27*   < > 25*   < > 28* 25*  --  27* 25* 23  CREATININE 1.23*   < > 1.13*   < > 1.38* 1.39*  --  1.27* 1.18* 1.45*  CALCIUM 7.8*   < > 7.5*   < > 7.8* 7.9*  --  8.0* 7.8* 7.8*  MG 2.4  --  2.3  --  2.6*  --   --  2.7*  --  2.6*  PHOS 2.4*   < > 2.5   < > 2.4* 4.2  --  2.4* 3.5 3.3   < > = values in this interval not displayed.    Liver Function Tests: Recent Labs  Lab 10/18/18 0348 10/18/18 1622 10/19/18 0349 10/19/18 1645 10/20/18 0417  ALBUMIN 1.7* 1.8* 1.8* 1.7* 1.7*   No results for input(s): LIPASE, AMYLASE in the last 168 hours. No results for input(s): AMMONIA in the last 168 hours.  CBC: Recent Labs  Lab 10/16/18 0518 10/17/18 0340 10/18/18 0348 10/18/18 1817 10/19/18 0349 10/20/18 0417  WBC 37.8* 36.2* 33.5*  --  25.7* 21.3*  NEUTROABS  --   --   --   --  21.0* 16.4*  HGB 9.9* 9.8* 9.7* 12.6 10.3* 9.4*  HCT 34.6* 34.4* 34.6* 37.0 35.8* 33.3*  MCV 92.0 93.7 93.5  --  94.0 94.1  PLT 146* 141* 154  --  184 180    Cardiac Enzymes: Recent Labs  Lab 10/15/18 0053 10/15/18 0632 10/15/18 1238  TROPONINI 0.04* 0.04* 0.04*    BNP: BNP (last 3 results) Recent Labs    09/22/18 1420  BNP 451.2*    ProBNP (last 3 results) No results for input(s): PROBNP in the last 8760 hours.    Other results:  Imaging: Dg Chest Port 1 View  Result Date: 10/19/2018 CLINICAL DATA:  Respiratory failure. EXAM: PORTABLE CHEST 1 VIEW COMPARISON:  Radiograph of October 16, 2018. FINDINGS: Stable cardiomegaly with central pulmonary vascular congestion. Stable position of tracheostomy and feeding tube. Right internal jugular catheter is unchanged in position. Left subclavian catheter is unchanged in position. Stable probable bilateral pulmonary edema. No significant pleural effusion is noted. No pneumothorax is noted. Bony thorax is unremarkable. IMPRESSION: Stable cardiomegaly with central pulmonary vascular  congestion and probable bilateral pulmonary edema. Stable support apparatus. Electronically Signed   By: Marijo Conception M.D.   On: 10/19/2018 07:27     Medications:     Scheduled Medications: . aspirin  81 mg Per Tube Daily  . atorvastatin  40 mg Oral q1800  . chlorhexidine gluconate (MEDLINE KIT)  15 mL Mouth Rinse BID  . Chlorhexidine Gluconate Cloth  6 each Topical Daily  . feeding supplement (PRO-STAT SUGAR FREE 64)  30 mL Per Tube TID  . insulin aspart  0-20 Units Subcutaneous Q4H  . insulin detemir  30 Units Subcutaneous BID  . mouth rinse  15 mL Mouth Rinse 10 times per day  . midodrine  15 mg Oral TID WC  . polyethylene glycol  17 g Oral Daily  . sodium chloride flush  10-40 mL Intracatheter Q12H    Infusions: .  prismasol BGK 4/2.5 300 mL/hr at 10/19/18 2259  .  prismasol BGK 4/2.5 300 mL/hr at 10/20/18 0117  . sodium chloride 10 mL/hr at 10/20/18 0600  . DOPamine Stopped (10/20/18 0105)  . famotidine (PEPCID) IV 20 mg (10/19/18 1530)  . feeding supplement (VITAL 1.5 CAL) 1,000 mL (10/18/18 1743)  . heparin 1,200 Units/hr (10/20/18 0600)  . norepinephrine (LEVOPHED) Adult infusion 4 mcg/min (10/20/18 0600)  . prismasol BGK 4/2.5 1,500 mL/hr at 10/20/18 0526  . sodium chloride 999 mL/hr at 10/06/18 1018    PRN Medications: sodium chloride, acetaminophen (TYLENOL) oral liquid 160 mg/5 mL, bisacodyl, docusate, heparin, midazolam, ondansetron, sodium chloride, sodium chloride flush   Assessment/Plan:   1. PEA arrest on 5/30 - due to hypoxia/pulmonary edema. - now stable  2. Acute on chronic hypoxic respiratory failure - has underlying  OHS/OSA  - intubated on 5/30 in setting of arrest. extubated 6/3.reintubated 6/11 with worsening RUL aispace disease.  - treated for MSSA PNA - completed cefipime/vanc 6/18 - s/p trach 6/19. Has been on/off trach collar. RT and CCM managing - CXR on 6/24 with persistent edema (perhaps slightly worse) but not pulling more on CVVHD  due to low BP.  - Weakness is major issue here  3.  Acute on chronic diastolic HF - Admitted with markedly elevated filling pressures complicated by severe cardiorenal syndrome - Now down 80+ pounds with CVVHD. - Volume management per Renal and CVVHD.Volume status stable.  Running even to slightly positive on CVVHD to help support BP   4. Pulmonary HTN, severe  - this is mostly pulmonary venous HTN by cath [facility] Group 2) but also has a component of OHS/OSA (WHO group 3). Improved with volume removal.  - no role for selective pulmonary vasodilators  5. AKI on CKD 3 - baseline creatinine 1.9 -> ESRD - Anuric. Remains on CVVHD. Renal managing - Hopefully we can get on iHD in near future - though I am concerned about her ability to tolerate with low BP - Continue midodrine 15 tid. Dopa off - Remains on/off low-dose NE (mostly at night) to support BP with goal SBP > 90. - If cannot maintain BP will not be able to get iHD. Will defer to Renal and CCM. Hopefully once we hold VVHD for a day or two BP will drift up enough to tolerate iHD on Saturday. D/w Dr. Royce Macadamia.   6. Morbid obesity - needs weight loss  6. Chronic AF/AFL with slow VR and symptomatic bradycardia - Off apixaban. Now on heparin. No bleeding. Discussed dosing with PharmD personally. - Started on dopamine for sustained rates in 30s over the weekend but now improved. Now off dopamine - Ideally would probably benefit from PPM but poor candidate given co-morbidities and recent HD start.  - EP has seen and determined not to be a PPM candidate  7. VT/NSVT - Quiescent.  off amio (for 3 weeks) due to bradycardia - Keep K> 4.0 Mg 2.0  8. MSSA PNA - Bcx drawn 6/2 & 6/11. NGTD - sputum cx with MSSA PNA -Treated with cefipime/vanc - course completed 6/18. No infectious x currently. WBC dropping 36K -> 33K -> 25. PCT low and falling.   9. Hypotension - Persists - Now on midodrine 15 tid. Off/on NE.  - We wil try to wean dopamine  slowly. Keep SBP >= 90 HR > 40 - This has been a major barrier  - Can consider Florinef but will need to watch fluid status. Northera also an option. I d/w Renal and will hold off at this point. -  If cannot maintain BP will not be able to get iHD and would be Palliative situation. Will defer to Renal and CCM.  10. Severe debility - needs PT   I will be out of town the next few days. At this point, I do not have much more to add aside from plans above. The main issues are weaning off the vent and keeping BP high enough to transition to iHD. HR stable off dopamine. EP has seen and was clear she is not candidate for PPM   I will sign off. Can page cardiology with questions.   Length of Stay: 33  Abraham Margulies MD 10/20/2018, 6:30 AM  Advanced Heart Failure Team Pager [phone] (M-F; 7a - 4p)  Please contact [facility] for night-coverage after hours (4p -7a ) and weekends on [email or web address]

## 2018-10-20 NOTE — Progress Notes (Signed)
  Speech Language Pathology Treatment: Nada Boozer Speaking valve  Patient Details Name: Kathleen [name] MRN: [id number] DOB: 1950-07-01 Today's Date: 10/20/2018 Time: 1102-1117 SLP Time Calculation (min) (ACUTE ONLY): 16 min  Assessment / Plan / Recommendation Clinical Impression  Pt continues to exhibit signs of reduced upper airway patency with aphonia and back pressure that starts to develop within a few respiratory cycles after PMV placement. She seems to have increased secretions today compared to initial evaluation. Although they come steadily throughout the session, they are thin, and able to be expectorated tracheally and orally. SLP provided assistance with use of yankauer and Min cues for harder coughing to remove secretions that reach the back of her oral cavity. HR and RR remained stable with SpO2 fluctuating throughout session, although suspect poor signal on sensor given improvements once changed. Would continue to recommend PMV use with SLP only. Would consider downsizing trach to a smaller size as medically able to do so to facilitate ability to wear PMV for communication and swallowing.    HPI HPI: 68 y.o. female admitted 09/22/2018 for surgery. PMH: permanent atrial flutter with baseline bradycardia, hypertension, super morbid obesity, IIDDM, dyslipidemia, obesity hypoventilation syndrome, CKD stage IV, chronic diastolic CHF, chronic edema. PEA 09/24/2018. Intubated 5/30-09/28/2018, again 6/11until trach 6/19. SLP was seeing pt in between intubations with concern for post-extubation dysphagia and instrumental testing recommended, although this was not completed as pt had been reintubated.      SLP Plan  Continue with current plan of care       Recommendations         Patient may use Passy-Muir Speech Valve: with SLP only MD: Please consider changing trach tube to : Smaller size;Cuffless         Oral Care Recommendations: Oral care QID Follow up Recommendations: (tba) SLP  Visit Diagnosis: Aphonia (R49.1) Plan: Continue with current plan of care       GO                [name] [name] 10/20/2018, 9:54 AM  Pollyann Glen, M.A. [facility] Environmental education officer [phone] Office [phone]

## 2018-10-21 ENCOUNTER — Inpatient Hospital Stay (HOSPITAL_COMMUNITY): Payer: Medicare HMO

## 2018-10-21 LAB — RENAL FUNCTION PANEL
Albumin: 2 g/dL — ABNORMAL LOW (ref 3.5–5.0)
Anion gap: 9 (ref 5–15)
BUN: 44 mg/dL — ABNORMAL HIGH (ref 8–23)
CO2: 24 mmol/L (ref 22–32)
Calcium: 8.2 mg/dL — ABNORMAL LOW (ref 8.9–10.3)
Chloride: 103 mmol/L (ref 98–111)
Creatinine, Ser: 2.35 mg/dL — ABNORMAL HIGH (ref 0.44–1.00)
GFR calc Af Amer: 24 mL/min — ABNORMAL LOW (ref >60)
GFR calc non Af Amer: 21 mL/min — ABNORMAL LOW (ref >60)
Glucose, Bld: 140 mg/dL — ABNORMAL HIGH (ref 70–99)
Phosphorus: 4 mg/dL (ref 2.5–4.6)
Potassium: 4.7 mmol/L (ref 3.5–5.1)
Sodium: 136 mmol/L (ref 135–145)

## 2018-10-21 LAB — HEPARIN LEVEL (UNFRACTIONATED)
Heparin Unfractionated: 0.19 IU/mL — ABNORMAL LOW (ref 0.30–0.70)
Heparin Unfractionated: 0.26 IU/mL — ABNORMAL LOW (ref 0.30–0.70)
Heparin Unfractionated: 0.41 IU/mL (ref 0.30–0.70)

## 2018-10-21 LAB — CBC
HCT: 29.2 % — ABNORMAL LOW (ref 36.0–46.0)
Hemoglobin: 8.1 g/dL — ABNORMAL LOW (ref 12.0–15.0)
MCH: 26.6 pg (ref 26.0–34.0)
MCHC: 27.7 g/dL — ABNORMAL LOW (ref 30.0–36.0)
MCV: 95.7 fL (ref 80.0–100.0)
Platelets: 145 10*3/uL — ABNORMAL LOW (ref 150–400)
RBC: 3.05 MIL/uL — ABNORMAL LOW (ref 3.87–5.11)
RDW: 28.3 % — ABNORMAL HIGH (ref 11.5–15.5)
WBC: 14.8 10*3/uL — ABNORMAL HIGH (ref 4.0–10.5)
nRBC: 0.2 % (ref 0.0–0.2)

## 2018-10-21 LAB — GLUCOSE, CAPILLARY
Glucose-Capillary: 135 mg/dL — ABNORMAL HIGH (ref 70–99)
Glucose-Capillary: 150 mg/dL — ABNORMAL HIGH (ref 70–99)
Glucose-Capillary: 160 mg/dL — ABNORMAL HIGH (ref 70–99)
Glucose-Capillary: 162 mg/dL — ABNORMAL HIGH (ref 70–99)
Glucose-Capillary: 172 mg/dL — ABNORMAL HIGH (ref 70–99)
Glucose-Capillary: 181 mg/dL — ABNORMAL HIGH (ref 70–99)

## 2018-10-21 LAB — MAGNESIUM: Magnesium: 2.7 mg/dL — ABNORMAL HIGH (ref 1.7–2.4)

## 2018-10-21 MED ORDER — SODIUM CHLORIDE 0.9 % IV SOLN: 100.0000 mL | INTRAVENOUS | Status: DC | PRN

## 2018-10-21 MED ORDER — LIDOCAINE HCL (PF) 1 % IJ SOLN: 5.0000 mL | INTRAMUSCULAR | Status: DC | PRN

## 2018-10-21 MED ORDER — HEPARIN SODIUM (PORCINE) 1000 UNIT/ML DIALYSIS
1000.0000 [IU] | INTRAMUSCULAR | Status: DC | PRN
  Administered 2018-10-22 – 2018-10-28 (×2): 3800 [IU] via INTRAVENOUS_CENTRAL
  Filled 2018-10-21 (×5): qty 1

## 2018-10-21 MED ORDER — ALBUMIN HUMAN 5 % IV SOLN
INTRAVENOUS | Status: AC
  Filled 2018-10-21: qty 250

## 2018-10-21 MED ORDER — DEXTROSE 50 % IV SOLN
INTRAVENOUS | Status: AC
  Filled 2018-10-21: qty 50

## 2018-10-21 MED ORDER — PENTAFLUOROPROP-TETRAFLUOROETH EX AERO: 1.0000 "application " | INHALATION_SPRAY | CUTANEOUS | Status: DC | PRN

## 2018-10-21 MED ORDER — ALTEPLASE 2 MG IJ SOLR
2.0000 mg | Freq: Once | INTRAMUSCULAR | Status: DC | PRN
  Filled 2018-10-21: qty 2

## 2018-10-21 MED ORDER — CHLORHEXIDINE GLUCONATE CLOTH 2 % EX PADS: 6.0000 | MEDICATED_PAD | Freq: Every day | CUTANEOUS | Status: DC

## 2018-10-21 MED ORDER — LIDOCAINE-PRILOCAINE 2.5-2.5 % EX CREA
1.0000 "application " | TOPICAL_CREAM | CUTANEOUS | Status: DC | PRN
  Filled 2018-10-21: qty 5

## 2018-10-21 NOTE — Progress Notes (Signed)
[place] for Heparin Indication: atrial fibrillation  Allergies  Allergen Reactions  . Doxycycline     REACTION: Wheals and pruritus    Patient Measurements: Height: 5' (152.4 cm) Weight: 218 lb 7.6 oz (99.1 kg) IBW/kg (Calculated) : 45.5  HEPARIN DW (KG): 71.8  Vital Signs: Temp: 98.4 F (36.9 C) (06/26 1930) Temp Source: Oral (06/26 1930) BP: 93/50 (06/26 2100) Pulse Rate: 47 (06/26 2100)  Labs: Recent Labs    10/19/18 0349  10/20/18 0417 10/20/18 1733 10/21/18 0200 10/21/18 1030 10/21/18 1922  HGB 10.3*  --  9.4*  --  8.1*  --   --   HCT 35.8*  --  33.3*  --  29.2*  --   --   PLT 184  --  180  --  145*  --   --   HEPARINUNFRC  --    < > 0.28* 0.44 0.19* 0.26* 0.41  CREATININE 1.27*   < > 1.45* 1.83* 2.35*  --   --    < > = values in this interval not displayed.    Estimated Creatinine Clearance: 24.5 mL/min (A) (by C-G formula based on SCr of 2.35 mg/dL (H)).  Assessment: 68 y.o. female with h/o Afib, (PTA Eliquis on hold) s/p trach placement.  Pharmacy has been consulted to dose heparin.   Hep lvl within goal 0.41  Goal of Therapy:  Heparin level 0.3-0.7 units/ml  Monitor platelets by anticoagulation protocol: Yes  Plan:  heparin 1500 units/hr Daily CBC and heparin level while on heparin Monitor for s/sx bleeding F/u restarting oral anticoagulant if able  Levester Fresh, PharmD, BCPS, BCCCP Clinical Pharmacist [phone]  Please check AMION for all [facility] numbers  10/21/2018 9:09 PM

## 2018-10-21 NOTE — Progress Notes (Signed)
[facility]    NEPHROLOGY PROGRESS NOTE  SUBJECTIVE:   She was taken off of CRRT after circuit expired on 6/25 at my request.  She had 0.6 liters UF via CRRT over 6/25 charted.  Anuric.  She has been on levophed 1 mcg/min - started back overnight.  Still off of dopamine with HR 40-50's.  BP 103/33 and HR 48 on my exam.    Review of systems:  Denies n/v Denies air hunger No pain   OBJECTIVE:  Vitals:   10/21/18 0500 10/21/18 0600  BP: (!) 114/50 (!) 113/43  Pulse: (!) 51 (!) 52  Resp: 17 (!) 24  Temp:    SpO2: 95% 99%    Intake/Output Summary (Last 24 hours) at 10/21/2018 2035 Last data filed at 10/21/2018 0600 Gross per 24 hour  Intake 1634.35 ml  Output 623 ml  Net 1011.35 ml      General: adult female in bed on vent via trach  HEENT: MMM [place] AT anicteric sclera Neck:  positive tracheostomy CV:  Bradycardic  Lungs:  Clear anteriorly  Abd:  abd SNT/ND Extremities:no pitting edema  Access: right subclavian tunneled catheter   MEDICATIONS:  . aspirin  81 mg Per Tube Daily  . atorvastatin  40 mg Oral q1800  . chlorhexidine gluconate (MEDLINE KIT)  15 mL Mouth Rinse BID  . Chlorhexidine Gluconate Cloth  6 each Topical Daily  . feeding supplement (PRO-STAT SUGAR FREE 64)  30 mL Per Tube TID  . insulin aspart  0-20 Units Subcutaneous Q4H  . insulin detemir  30 Units Subcutaneous BID  . magic mouthwash  2 mL Oral TID  . mouth rinse  15 mL Mouth Rinse 10 times per day  . midodrine  15 mg Oral TID WC  . polyethylene glycol  17 g Oral Daily  . sodium chloride flush  10-40 mL Intracatheter Q12H       LABS:   CBC Latest Ref Rng & Units 10/21/2018 10/20/2018 10/19/2018  WBC 4.0 - 10.5 K/uL 14.8(H) 21.3(H) 25.7(H)  Hemoglobin 12.0 - 15.0 g/dL 8.1(L) 9.4(L) 10.3(L)  Hematocrit 36.0 - 46.0 % 29.2(L) 33.3(L) 35.8(L)  Platelets 150 - 400 K/uL 145(L) 180 184    CMP Latest Ref Rng & Units 10/21/2018 10/20/2018 10/20/2018  Glucose 70 - 99 mg/dL 140(H) 119(H)  195(H)  BUN 8 - 23 mg/dL 44(H) 30(H) 23  Creatinine 0.44 - 1.00 mg/dL 2.35(H) 1.83(H) 1.45(H)  Sodium 135 - 145 mmol/L 136 139 134(L)  Potassium 3.5 - 5.1 mmol/L 4.7 4.9 4.6  Chloride 98 - 111 mmol/L 103 107 101  CO2 22 - 32 mmol/L 24 26 25   Calcium 8.9 - 10.3 mg/dL 8.2(L) 8.2(L) 7.8(L)  Total Protein 6.5 - 8.1 g/dL - - -  Total Bilirubin 0.3 - 1.2 mg/dL - - -  Alkaline Phos 38 - 126 U/L - - -  AST 15 - 41 U/L - - -  ALT 0 - 44 U/L - - -    Lab Results  Component Value Date   PTH 33 10/12/2018   CALCIUM 8.2 (L) 10/21/2018   CAION 1.17 10/18/2018   PHOS 4.0 10/21/2018       Component Value Date/Time   COLORURINE AMBER (A) 05/22/2013 1826   APPEARANCEUR HAZY (A) 05/22/2013 1826   LABSPEC 1.024 05/22/2013 1826   PHURINE 6.0 05/22/2013 1826   GLUCOSEU NEGATIVE 05/22/2013 1826   HGBUR NEGATIVE 05/22/2013 1826   BILIRUBINUR SMALL (A) 05/22/2013 1826   KETONESUR NEGATIVE 05/22/2013 1826  PROTEINUR 100 (A) 05/22/2013 1826   UROBILINOGEN 4.0 (H) 05/22/2013 1826   NITRITE NEGATIVE 05/22/2013 1826   LEUKOCYTESUR NEGATIVE 05/22/2013 1826      Component Value Date/Time   PHART 7.354 10/18/2018 1817   PCO2ART 48.5 (H) 10/18/2018 1817   PO2ART 45.0 (L) 10/18/2018 1817   HCO3 27.0 10/18/2018 1817   TCO2 28 10/18/2018 1817   ACIDBASEDEF 1.0 10/12/2018 0433   O2SAT 78.0 10/18/2018 1817       Component Value Date/Time   IRON 44 10/12/2018 1640   TIBC 379 10/12/2018 1640   IRONPCTSAT 12 10/12/2018 1640       ASSESSMENT/PLAN:    68 year old female patient with chronic diastolic heart failure who failed outpatient diuretic therapy underwent a right heart catheterization on 5/29 showing markedly elevated biventricular filling pressures and had a subsequent PEA arrest on 5/30.  She was intubated and ROSC was achieved.  She was started on CRRT on 5/31 for acute kidney injury.  She has had an ongoing pressor requirement.  1.  Acute kidney injury.  ATN 2/2 shock.  As of transfer of her  care on 6/23, she had lost about 39 kg to date with CRRT.  Right subclavian tunneled catheter  - No acute indication for HD today  - Transition to trial of intermittent HD with first tx 6/27.  Spoke with cardiology/HF re: same on 6/25 and he is in agreement.  She has been on midodrine to help wean low dose levo requirement  2.  Chronic kidney disease stage IV with a reported baseline serum creatinine around 1.7-1.9.  CKD 2/2 DM and HTN.  Intact PTH 33 on 10/12/2018.   3.  Chronic hypoxic respiratory failure.  Status post tracheostomy  4. Chronic diastolic heart failure.  Optimize volume with renal replacement therapy.  Noted cardiology may w/u for possible amyloid.    5. Normocytic anemia - stable and no acute indication for PRBC's.  Aranesp 40 mcg once 6/25  6. Bradycardia - previously on dopamine, now off   Kathleen [name] 10/21/2018  6:35 AM

## 2018-10-21 NOTE — Progress Notes (Signed)
Sutures removed from trach per MD order. Pt remains on ATC tolerating well

## 2018-10-21 NOTE — Progress Notes (Signed)
NAME:  Kathleen [name], MRN:  [id number], DOB:  Sep 21, 1950, LOS: 37 ADMISSION DATE:  09/22/2018, CONSULTATION DATE: 09/24/2018 REFERRING MD: Candee Furbish MD, CHIEF COMPLAINT: Cardiac arrest  Brief History   68 y/o obese woman with chronic diastolic heart failure who failed outpatient diuretics management due to chronic kidney disease.  Right heart cath on 5/29 showed markedly elevated biventricular filling pressures with normal cardiac output.  She was on cardiology service, maintained on milrinone and Lasix.  On the morning of 5/30 she went into PEA arrest, CPR performed for 8 minutes.  Intubated and transferred to ICU.  Started on CRRT 5/31.  Extubated 6/3.  Re-intubated early am 6/11 for hypercarbic respiratory failure and underwent tracheostomy on 6/19  Past Medical History   has a past medical history of Atrial flutter ([place]), Bradycardia, Chronic diastolic (congestive) heart failure ([place]) (01/10/2015), CKD (chronic kidney disease), stage IV ([place]), Degenerative joint disease of hand, Diabetes mellitus, Dyslipidemia, Fecal occult blood test positive, GERD (gastroesophageal reflux disease), Headache, Hypertension, Inadequate material resources, Irritable bowel syndrome, Morbid obesity ([place]), Obesity hypoventilation syndrome ([place]), Post-menopausal bleeding, and Shortness of breath dyspnea.  Significant Hospital Events   5/28 Admit 5/29 RHC 5/30 PEA arrest, intubated/ CRRT 5/31 Milrinone stopped due to ectopy; brady episode- amio stopped 6/02 Attempt at SBT, chest x-ray with worsening pulmonary edema, CVVHD for volume removal 6/03 Extubated > remain on CRRT.  Refused nocturnal BiPAP 6/04 Refused nocturnal BiPAP 6/10 off CRRT 6/11 Intubated early am with hypercarbia, concern for RUL PNA; abx broadened; restarted on CRRT 6/16  No events overnight, weaning on high PS 6/17  failed vent wean, remains on levophed, midodrine and CRRT 6/18  Weaned for 5 hours on PSV 8/5 before requiring full support;  ongoing CRRT, remains on levophed 14 mcg 6/19 - TRACH + Dr Erskine Emery 6/20 -. On IV heparin gt, levophed gtt with midodrine. Off fent gtt. Pressopr needs down 6/26 - TCT tolerated for the past 24 hours   Consults:  Cardiology PCCM  Nephrology   Procedures:  Lt PICC 5/29 >> OETT 5/30 >> 6/3; 6/11 > 6/19 - TRACH (Dr Erskine Emery) >> R IJ HD cath 5/30 >> Aline left ulnar 5/30 >> 6/2  Significant Diagnostic Tests:  TTE 5/28 >>The left ventricle has normal systolic function with an ejection fraction of 60-65%. . The right ventricle has normal systolic function. The cavity was mildly enlarged.   Right heart cath 5/29 RA = 24 RV = 94/26 PA = 95/36 (53) PCW = 30 (v = 50) Fick cardiac output/index = 6.0/2.7 PVR =3.9 WU FA sat = 91% PA sat = 58%, 58% SVC 60%  CXR 6/15> Bilateral ASD LLL > R. ETT, GT, LIJ and RIJ CVCs stable position.   Micro Data:  SARS coronavirus 2 cepheid 5/28 >> neg MRSA PCR 5/28 >> neg Trach aspirate 6/2 >> MSSA BCx2 6/2 >> negative ...................... Tracheal aspirate 6/11 >> few GNR >> few proteus and few candida tropicalis BCx2 6/11 >> negative  Antimicrobials:  Vancomycin 6/3 > 6/4 Cefazolin 6/4 >> 6/10 ...........................Marland Kitchen Vanco 6/11 >> 6/17 Cefepime 6/11 >>6/18  Interim history/subjective:  No issues overnight.   Objective   Blood pressure (!) 116/43, pulse (!) 53, temperature 97.6 F (36.4 C), temperature source Oral, resp. rate 20, height 5' (1.524 m), weight 99.1 kg, SpO2 98 %. CVP:  [6 mmHg-14 mmHg] 14 mmHg  FiO2 (%):  [40 %-60 %] 40 %   Intake/Output Summary (Last 24 hours) at 10/21/2018 0810 Last data  filed at 10/21/2018 0800 Gross per 24 hour  Intake 1521.48 ml  Output 525 ml  Net 996.48 ml   Filed Weights   10/19/18 0500 10/20/18 0500 10/21/18 0159  Weight: 100.3 kg 99.7 kg 99.1 kg    General appearance: 68 y.o., female, NAD, comfortable in bed Eyes: anicteric sclerae, tracking  HENT: NCAT; oropharynx, MMM Neck:  Trachea midline; no jvd, tracheostomy in place  Lungs: CTAB, no crackles no wheeze  CV: RRR, S1, S2, systolic murmur LLSB  Abdomen: Soft, non-tender; non-distended, BS present  Extremities: No peripheral edema, radial and DP pulses present bilaterally  Skin: Normal temperature, turgor and texture; no rash Neuro: alert, following commands   CXR: vascular congestion, chronic in appearance The patient's images have been independently reviewed by me.    Resolved Hospital Problem list    Assessment & Plan:  ASSESSMENT / PLAN:  PULMONARY A:  Baseline hx - OSA/OHV - non compliant  Acute on chronic resp failure following cardiac arrest- prolonged mech vent and s/p trach 10/14/2018 P:   Continue TCT as tolerated  NEUROLOGIC A:   Acute Encephalopathy - resolved 6/19. Off fent gtt as of 10/15/2018 P:   Low dose klonipin and seroquel   VASCULAR A:   Circulatory shock, resolved off pressors P:  Continue midodrine  Off vasopressors at this time   CARDIAC A: PEA arrest : -suspect hypoxia driven Acute on chronic diastolic heart failure --admit with elevated filling pressures, cardiorenal syndrome  Pulmonary hypertension, severe : -s/p RHC, WHO class 2,3 Permanent AF/AFl - -on anticoagulation pre admit NSVT/VT - -off amio due to bradycardia  P: HF Regimen per HF team  Observing bradycardia   INFECTIOUS A:   MSSA PNA  6/2 Proteus VAP 6/11 Procalcitonin 0.32 on 10/18/2018 White count 33.5 P:   Off abx  Follow temp curve  And infectious marker trends  Reculture if needed   RENAL Lab Results  Component Value Date   CREATININE 2.35 (H) 10/21/2018   CREATININE 1.83 (H) 10/20/2018   CREATININE 1.45 (H) 10/20/2018   CREATININE 1.60 (H) 04/06/2016   CREATININE 0.94 06/07/2014   CREATININE 0.93 06/02/2013   Recent Labs  Lab 10/20/18 0417 10/20/18 1733 10/21/18 0200  K 4.6 4.9 4.7    A:  Cardiorenal syndrome AKI/ CKD 3-4, remains anuric P:  Off CRRT at this time   Planning to have first HD session tomorrow  Would like to observe on HD before moving out.    ENDOCRINE CBG (last 3)  Recent Labs    10/20/18 2354 10/21/18 0354 10/21/18 0726  GLUCAP 110* 135* 162*   Filed Weights   10/19/18 0500 10/20/18 0500 10/21/18 0159  Weight: 100.3 kg 99.7 kg 99.1 kg    A:   Morbid OBestity DM-  P:   levemir + SSI with CBGs  HEMATOLOGIC Recent Labs    10/20/18 0417 10/21/18 0200  HGB 9.4* 8.1*  ' A:  At risk anemia critical illness P:  Low threshold for transfusion   GI A:   Tolerting TF Constipation P:   BM regimen PRN   MSK/DERM RT hip blister - 6/17 P Dressing changes prn   Best practice:  Diet: NPO for trach, will resume TF per cortrak , swallow evaluation near future note she has a tracheostomy in place and tolerating trach collar Pain/Anxiety/Delirium protocol (if indicated):  In place  VAP protocol (if indicated): In place  DVT prophylaxis: Heparin gtt GI prophylaxis: Pepcid Glucose control: SSI Mobility: Bed Code Status:  Full Family Communication: none 10/20/2018 patient updated at bedside Disposition: ICU  Garner Nash, DO [facility] 10/21/2018 8:59 AM  Personal pager: [phone] If unanswered, please page CCM On-call: [phone]

## 2018-10-21 NOTE — Progress Notes (Signed)
Physical Therapy Treatment Patient Details Name: Kathleen [name] MRN: [id number] DOB: [birth year] Today's Date: 10/21/2018    History of Present Illness Kathleen [name] is a 68 y.o. female with history of permanent AF, hypertension, super morbid obesity, DM2, dyslipidemia, obesity hypoventilation syndrome (on home O2 with no OSA by prior PSG but showed nocturnal hypoxemia), CKD stage IV (baseline creatinine about 1.9), chronic diastolic CHF.  Admit for fluid overload. Also with pulmonary HTN.  PEA arrest 5/30, transfer to the ICU, extubater 6/3.  Intubated 10/06/18.  Trach on 10/14/18.      PT Comments    Pt admitted with above diagnosis. Pt currently with functional limitations due to balance and endurance deficits. Pt was able to stand to [facility] with max assist of 2.  Pt moved to recliner with Stedy.  Pt was having BM therefore stood a minute to be cleaned and then to recliner.  Making slow but steady progress.  Pt will benefit from skilled PT to increase their independence and safety with mobility to allow discharge to the venue listed below.     Follow Up Recommendations  CIR     Equipment Recommendations  None recommended by PT;Other (comment)(TBA)    Recommendations for Other Services Rehab consult     Precautions / Restrictions Precautions Precautions: Fall Precaution Comments: watch O2 sats.  CVVHD ( CRRT) Restrictions Weight Bearing Restrictions: No    Mobility  Bed Mobility Overal bed mobility: Needs Assistance Bed Mobility: Supine to Sit;Sit to Supine     Supine to sit: Max assist;+2 for physical assistance;Mod assist     General bed mobility comments: Pt needed assist for LEs and pad to scoot pt out as well as pt pulled up on PT.   Transfers Overall transfer level: Needs assistance   Transfers: Sit to/from Stand Sit to Stand: Mod assist;+2 physical assistance;Max assist;From elevated surface         General transfer comment: Used Stedy for pt to stand up.  Pt  needed use of pad to assist to rise.  Took 2 attempts and bed raised.  Pt began stooling once up and sitting on the [place].  Had to clean pt while sitting as it was dripping down on floor.  then stood pt again and cleaned her the rest of the way and she was able to stand for up to a minute to be cleaned.  Left pt in chair.   Ambulation/Gait                 Stairs             Wheelchair Mobility    Modified Rankin (Stroke Patients Only)       Balance Overall balance assessment: Needs assistance Sitting-balance support: No upper extremity supported;Bilateral upper extremity supported Sitting balance-Leahy Scale: Fair Sitting balance - Comments: Pt could sit EOB once scooted out with out UE suport   Standing balance support: Bilateral upper extremity supported;During functional activity Standing balance-Leahy Scale: Poor Standing balance comment: B UE support in standing.                              Cognition Arousal/Alertness: Awake/alert Behavior During Therapy: WFL for tasks assessed/performed Overall Cognitive Status: Within Functional Limits for tasks assessed                                 General  Comments: pt intermittently would have increased difficulty following commands and require multimodal cueing (increased with fatigue), will continue to assess.  Also delayed response time to commands up to 10 seconds at times.       Exercises General Exercises - Upper Extremity Shoulder Flexion: AROM;Both;10 reps;Supine;Theraband Theraband Level (Shoulder Flexion): Level 4 (Blue) Shoulder Horizontal ADduction: AROM;Both;10 reps;Supine;Theraband Theraband Level (Shoulder Horizontal Adduction): Level 4 (Blue) General Exercises - Lower Extremity Ankle Circles/Pumps: AROM;Both;10 reps;Supine Long Arc Quad: AROM;Both;10 reps;Seated Heel Slides: AROM;Both;10 reps;Supine    General Comments General comments (skin integrity, edema, etc.): RN  present to assist PT with transfer      Pertinent Vitals/Pain Pain Assessment: No/denies pain    Home Living                      Prior Function            PT Goals (current goals can now be found in the care plan section) Acute Rehab PT Goals Patient Stated Goal: to go home Progress towards PT goals: Progressing toward goals    Frequency    Min 3X/week      PT Plan Current plan remains appropriate    Co-evaluation              AM-PAC PT "6 Clicks" Mobility   Outcome Measure  Help needed turning from your back to your side while in a flat bed without using bedrails?: A Lot Help needed moving from lying on your back to sitting on the side of a flat bed without using bedrails?: A Lot Help needed moving to and from a bed to a chair (including a wheelchair)?: A Lot Help needed standing up from a chair using your arms (e.g., wheelchair or bedside chair)?: A Lot Help needed to walk in hospital room?: Total Help needed climbing 3-5 steps with a railing? : Total 6 Click Score: 10    End of Session Equipment Utilized During Treatment: Gait belt;Oxygen Activity Tolerance: Patient tolerated treatment well Patient left: with call bell/phone within reach;in chair;with chair alarm set Nurse Communication: Mobility status;Need for lift equipment PT Visit Diagnosis: Muscle weakness (generalized) (M62.81);Other abnormalities of gait and mobility (R26.89)     Time: 6761-9509 PT Time Calculation (min) (ACUTE ONLY): 44 min  Charges:  $Therapeutic Exercise: 8-22 mins $Therapeutic Activity: 23-37 mins                     [place] Pager:  [phone]  Office:  [place] 10/21/2018, 3:46 PM

## 2018-10-21 NOTE — Progress Notes (Signed)
Unable to get blood return on either port of PICC to obtain 2000 heparin level. IV team consult placed to assess issues with PICC. Will have phlebotomy obtain labs until issue resolved.

## 2018-10-21 NOTE — Progress Notes (Signed)
RN unable to get blood return on either port of PICC to draw 1000 heparin level. Phlebotomy called to obtain lab and IV team consult placed to assess clotting issues with PICC. Will continue to assess.

## 2018-10-21 NOTE — Progress Notes (Signed)
[place] for Heparin Indication: atrial fibrillation  Allergies  Allergen Reactions  . Doxycycline     REACTION: Wheals and pruritus    Patient Measurements: Height: 5' (152.4 cm) Weight: 218 lb 7.6 oz (99.1 kg) IBW/kg (Calculated) : 45.5  HEPARIN DW (KG): 71.8  Vital Signs: Temp: 97.6 F (36.4 C) (06/26 0728) Temp Source: Oral (06/26 0728) BP: 99/40 (06/26 1058) Pulse Rate: 50 (06/26 1058)  Labs: Recent Labs    10/19/18 0349  10/20/18 0417 10/20/18 1733 10/21/18 0200 10/21/18 1030  HGB 10.3*  --  9.4*  --  8.1*  --   HCT 35.8*  --  33.3*  --  29.2*  --   PLT 184  --  180  --  145*  --   HEPARINUNFRC  --    < > 0.28* 0.44 0.19* 0.26*  CREATININE 1.27*   < > 1.45* 1.83* 2.35*  --    < > = values in this interval not displayed.    Estimated Creatinine Clearance: 24.5 mL/min (A) (by C-G formula based on SCr of 2.35 mg/dL (H)).  Assessment: 68 y.o. female with h/o Afib, (PTA Eliquis on hold) s/p trach placement.  Pharmacy has been consulted to dose heparin.   Heparin level is subtherapeutic this morning at 0.26. Hgb trending down to 8.1, with platelets also trending down today to 145. (Of note, CRRT stopped yesterday and volume of blood remaining in the CRRT circuit/tubing may account for some of this downtrend). No overt bleeding noted per patient or RN, and no issues with the heparin infusion today per RN.  Goal of Therapy:  Heparin level 0.3-0.7 units/ml  Monitor platelets by anticoagulation protocol: Yes   Plan:  Increase heparin to 1500 units/hr Check 8 hour heparin level Daily CBC and heparin level while on heparin Monitor for s/sx bleeding F/u restarting oral anticoagulant if able   Brendolyn Patty, PharmD PGY1 Pharmacy Resident Phone [phone]  10/21/2018   11:22 AM

## 2018-10-22 DIAGNOSIS — R001 Bradycardia, unspecified: Secondary | ICD-10-CM

## 2018-10-22 LAB — RENAL FUNCTION PANEL
Albumin: 1.9 g/dL — ABNORMAL LOW (ref 3.5–5.0)
Anion gap: 9 (ref 5–15)
BUN: 72 mg/dL — ABNORMAL HIGH (ref 8–23)
CO2: 23 mmol/L (ref 22–32)
Calcium: 8.3 mg/dL — ABNORMAL LOW (ref 8.9–10.3)
Chloride: 100 mmol/L (ref 98–111)
Creatinine, Ser: 3.58 mg/dL — ABNORMAL HIGH (ref 0.44–1.00)
GFR calc Af Amer: 14 mL/min — ABNORMAL LOW (ref >60)
GFR calc non Af Amer: 12 mL/min — ABNORMAL LOW (ref >60)
Glucose, Bld: 184 mg/dL — ABNORMAL HIGH (ref 70–99)
Phosphorus: 4.7 mg/dL — ABNORMAL HIGH (ref 2.5–4.6)
Potassium: 4.8 mmol/L (ref 3.5–5.1)
Sodium: 132 mmol/L — ABNORMAL LOW (ref 135–145)

## 2018-10-22 LAB — HEPARIN LEVEL (UNFRACTIONATED): Heparin Unfractionated: 0.31 IU/mL (ref 0.30–0.70)

## 2018-10-22 LAB — GLUCOSE, CAPILLARY
Glucose-Capillary: 202 mg/dL — ABNORMAL HIGH (ref 70–99)
Glucose-Capillary: 157 mg/dL — ABNORMAL HIGH (ref 70–99)
Glucose-Capillary: 163 mg/dL — ABNORMAL HIGH (ref 70–99)
Glucose-Capillary: 150 mg/dL — ABNORMAL HIGH (ref 70–99)
Glucose-Capillary: 212 mg/dL — ABNORMAL HIGH (ref 70–99)
Glucose-Capillary: 149 mg/dL — ABNORMAL HIGH (ref 70–99)

## 2018-10-22 LAB — CBC
HCT: 28.3 % — ABNORMAL LOW (ref 36.0–46.0)
Hemoglobin: 7.9 g/dL — ABNORMAL LOW (ref 12.0–15.0)
MCH: 26.7 pg (ref 26.0–34.0)
MCHC: 27.9 g/dL — ABNORMAL LOW (ref 30.0–36.0)
MCV: 95.6 fL (ref 80.0–100.0)
Platelets: 144 10*3/uL — ABNORMAL LOW (ref 150–400)
RBC: 2.96 MIL/uL — ABNORMAL LOW (ref 3.87–5.11)
RDW: 27.9 % — ABNORMAL HIGH (ref 11.5–15.5)
WBC: 12.4 10*3/uL — ABNORMAL HIGH (ref 4.0–10.5)
nRBC: 0.4 % — ABNORMAL HIGH (ref 0.0–0.2)

## 2018-10-22 MED ORDER — HEPARIN SODIUM (PORCINE) 1000 UNIT/ML IJ SOLN
INTRAMUSCULAR | Status: AC
  Administered 2018-10-22: 3800 [IU] via INTRAVENOUS_CENTRAL
  Filled 2018-10-22: qty 4

## 2018-10-22 MED ORDER — DARBEPOETIN ALFA 100 MCG/0.5ML IJ SOSY
100.0000 ug | PREFILLED_SYRINGE | INTRAMUSCULAR | Status: DC
  Administered 2018-10-26 – 2018-11-02 (×2): 100 ug via INTRAVENOUS
  Filled 2018-10-22 (×3): qty 0.5

## 2018-10-22 NOTE — Progress Notes (Addendum)
NAME:  Kathleen [name], MRN:  [id number], DOB:  1950-11-28, LOS: 54 ADMISSION DATE:  09/22/2018, CONSULTATION DATE: 09/24/2018 REFERRING MD: Candee Furbish MD, CHIEF COMPLAINT: Cardiac arrest  Brief History   68 y/o obese woman with chronic diastolic heart failure who failed outpatient diuretics management due to chronic kidney disease.  Right heart cath on 5/29 showed markedly elevated biventricular filling pressures with normal cardiac output.  She was on cardiology service, maintained on milrinone and Lasix.  On the morning of 5/30 she went into PEA arrest, CPR performed for 8 minutes.  Intubated and transferred to ICU.  Started on CRRT 5/31.  Extubated 6/3.  Re-intubated early am 6/11 for hypercarbic respiratory failure and underwent tracheostomy on 6/19  Past Medical History   has a past medical history of Atrial flutter ([place]), Bradycardia, Chronic diastolic (congestive) heart failure ([place]) (01/10/2015), CKD (chronic kidney disease), stage IV ([place]), Degenerative joint disease of hand, Diabetes mellitus, Dyslipidemia, Fecal occult blood test positive, GERD (gastroesophageal reflux disease), Headache, Hypertension, Inadequate material resources, Irritable bowel syndrome, Morbid obesity ([place]), Obesity hypoventilation syndrome ([place]), Post-menopausal bleeding, and Shortness of breath dyspnea.  Significant Hospital Events   5/28 Admit 5/29 RHC 5/30 PEA arrest, intubated/ CRRT 5/31 Milrinone stopped due to ectopy; brady episode- amio stopped 6/02 Attempt at SBT, chest x-ray with worsening pulmonary edema, CVVHD for volume removal 6/03 Extubated > remain on CRRT.  Refused nocturnal BiPAP 6/04 Refused nocturnal BiPAP 6/10 off CRRT 6/11 Intubated early am with hypercarbia, concern for RUL PNA; abx broadened; restarted on CRRT 6/16  No events overnight, weaning on high PS 6/17  failed vent wean, remains on levophed, midodrine and CRRT 6/18  Weaned for 5 hours on PSV 8/5 before requiring full support;  ongoing CRRT, remains on levophed 14 mcg 6/19 - TRACH + Dr Erskine Emery 6/20 -. On IV heparin gt, levophed gtt with midodrine. Off fent gtt. Pressopr needs down 6/26 - TCT tolerated for the past 24 hours  6/27 - Stable on TCT now >48hrs, tolerated first session of iHD   Consults:  Cardiology PCCM  Nephrology   Procedures:  Lt PICC 5/29 >> OETT 5/30 >> 6/3; 6/11 > 6/19 - TRACH (Dr Erskine Emery) >> R IJ HD cath 5/30 >> Aline left ulnar 5/30 >> 6/2  Significant Diagnostic Tests:  TTE 5/28 >>The left ventricle has normal systolic function with an ejection fraction of 60-65%. . The right ventricle has normal systolic function. The cavity was mildly enlarged.   Right heart cath 5/29 RA = 24 RV = 94/26 PA = 95/36 (53) PCW = 30 (v = 50) Fick cardiac output/index = 6.0/2.7 PVR =3.9 WU FA sat = 91% PA sat = 58%, 58% SVC 60%  CXR 6/15> Bilateral ASD LLL > R. ETT, GT, LIJ and RIJ CVCs stable position.   Micro Data:  SARS coronavirus 2 cepheid 5/28 >> neg MRSA PCR 5/28 >> neg Trach aspirate 6/2 >> MSSA BCx2 6/2 >> negative ...................... Tracheal aspirate 6/11 >> few GNR >> few proteus and few candida tropicalis BCx2 6/11 >> negative  Antimicrobials:  Vancomycin 6/3 > 6/4 Cefazolin 6/4 >> 6/10 ...........................Marland Kitchen Vanco 6/11 >> 6/17 Cefepime 6/11 >>6/18  Interim history/subjective:  Tolerating iHD today. Pressures stable. HR stable remains brady. Patient nods head that she is feeling much better.   Objective   Blood pressure (!) 116/42, pulse (!) 51, temperature 98.1 F (36.7 C), temperature source Oral, resp. rate 17, height 5' (1.524 m), weight 101.4 kg, SpO2 92 %.  CVP:  [9 mmHg-13 mmHg] 12 mmHg  FiO2 (%):  [35 %-60 %] 40 %   Intake/Output Summary (Last 24 hours) at 10/22/2018 1024 Last data filed at 10/22/2018 0600 Gross per 24 hour  Intake 1136.79 ml  Output -  Net 1136.79 ml   Filed Weights   10/20/18 0500 10/21/18 0159 10/22/18 0700  Weight: 99.7 kg  99.1 kg 101.4 kg    General appearance: 68 y.o., female, comfortable on TCT  Eyes: anicteric sclerae,  tracking appropriately HENT: NCAT; oropharynx, MMM Neck: Trachea midline; no JVD, tracheostomy in place  Lungs: CTAB, no crackles, no wheeze  CV: RRR, S1, S2, systolic murmur LLSB  Abdomen: Soft, non-tender; non-distended, BS present  Extremities: wrapped dressing, no edema  Psych: Appropriate affect Neuro: Alert and oriented to person and place, no focal deficit    CXR: persistent vascular congestion on chest films  The patient's images have been independently reviewed by me.    Resolved Hospital Problem list    Assessment & Plan:    PULMONARY A:  Baseline hx - OSA/OHV - non compliant  Acute on chronic resp failure following cardiac arrest- prolonged mech vent and s/p trach 10/14/2018 - all secondary to decompensated heart failure  P:   She has been on TCT for >48 hours doing well  Stable for transfer to SDU status  Pulmonary service will follow for trach needs  NEUROLOGIC A:   Acute Encephalopathy - resolved 6/19. Off fent gtt as of 10/15/2018 P:   Low dose klonipin and seroqueal   VASCULAR A:   Circulatory shock, resolved off pressors P:  Continue midodrine dosing   CARDIAC A: PEA arrest : -suspect hypoxia driven Acute on chronic diastolic heart failure --admit with elevated filling pressures, cardiorenal syndrome  Pulmonary hypertension, severe : -s/p RHC, WHO class 2,3 Permanent AF/AFl - -on anticoagulation pre admit NSVT/VT - -off amio due to bradycardia P: Bradycardia is HD stable, we will follow this  HF regimen per HF team  Heparin ggt   INFECTIOUS A:   MSSA PNA  6/2 Proteus VAP 6/11 Procalcitonin 0.32 on 10/18/2018 P:   Observing off abx Follow temp curve and infectious markers  We will reculture if needed   RENAL Lab Results  Component Value Date   CREATININE 3.58 (H) 10/22/2018   CREATININE 2.35 (H) 10/21/2018   CREATININE 1.83 (H)  10/20/2018   CREATININE 1.60 (H) 04/06/2016   CREATININE 0.94 06/07/2014   CREATININE 0.93 06/02/2013   Recent Labs  Lab 10/20/18 1733 10/21/18 0200 10/22/18 0316  K 4.9 4.7 4.8    A:  Cardiorenal syndrome AKI/ CKD 3-4, remains anuric P:  Tolerating first session of iHD today   ENDOCRINE CBG (last 3)  Recent Labs    10/21/18 2348 10/22/18 0355 10/22/18 0716  GLUCAP 181* 202* 157*   Filed Weights   10/20/18 0500 10/21/18 0159 10/22/18 0700  Weight: 99.7 kg 99.1 kg 101.4 kg    A:   Morbid OBestity DM-  P:   SSI + levemir  q4 CBGS   HEMATOLOGIC Recent Labs    10/21/18 0200 10/22/18 0316  HGB 8.1* 7.9*  ' A:  At risk anemia critical illness P:  Following conservative transfusion threshold   GI A:   Tolerting TF Constipation P:   Regimen for BM as needed   MSK/DERM RT hip blister - 6/17 P Dressing changes PRN   Best practice:  Diet: TF  Pain/Anxiety/Delirium protocol (if indicated):  In place  VAP protocol (  if indicated): In place  DVT prophylaxis: Heparin gtt GI prophylaxis: Pepcid Glucose control: SSI Mobility: Bed Code Status: Full Family Communication: per primary  Disposition: SDU   Patient is stable for transfer to SDU/vent bed. I have paged [facility] and will place to transfer to their service tomorrow. Pulmonary will follow for all trach needs.   Garner Nash, DO  Pulmonary Critical Care 10/22/2018 10:24 AM  Personal pager: [phone] If unanswered, please page CCM On-call: [phone]

## 2018-10-22 NOTE — Progress Notes (Signed)
Orthopedic Tech Progress Note Patient Details:  Kathleen [name] [birth year] [id number]  Ortho Devices Type of Ortho Device: Haematologist, Ace wrap Ortho Device/Splint Location: Removed unna boots, washed legs and applied bilateral unna boots. Ortho Device/Splint Interventions: Application   Post Interventions Patient Tolerated: Well Instructions Provided: Care of device   [name] 10/22/2018, 5:50 PM

## 2018-10-22 NOTE — Progress Notes (Signed)
[place] for Heparin Indication: atrial fibrillation  Allergies  Allergen Reactions  . Doxycycline     REACTION: Wheals and pruritus    Patient Measurements: Height: 5' (152.4 cm) Weight: 222 lb 7.1 oz (100.9 kg) IBW/kg (Calculated) : 45.5  HEPARIN DW (KG): 71.8  Vital Signs: Temp: 97.8 F (36.6 C) (06/27 1121) Temp Source: Oral (06/27 1121) BP: 110/46 (06/27 1018) Pulse Rate: 48 (06/27 1117)  Labs: Recent Labs    10/20/18 0417 10/20/18 1733 10/21/18 0200 10/21/18 1030 10/21/18 1922 10/22/18 0316  HGB 9.4*  --  8.1*  --   --  7.9*  HCT 33.3*  --  29.2*  --   --  28.3*  PLT 180  --  145*  --   --  144*  HEPARINUNFRC 0.28* 0.44 0.19* 0.26* 0.41 0.31  CREATININE 1.45* 1.83* 2.35*  --   --  3.58*    Estimated Creatinine Clearance: 16.3 mL/min (A) (by C-G formula based on SCr of 3.58 mg/dL (H)).  Assessment: 68 y.o. female with h/o Afib, (PTA Eliquis on hold) s/p trach placement.  Pharmacy has been consulted to dose heparin.   Hep level within goal 0.31. No bleeding issues noted.   Goal of Therapy:  Heparin level 0.3-0.7 units/ml  Monitor platelets by anticoagulation protocol: Yes  Plan:  heparin 1500 units/hr Daily CBC and heparin level while on heparin Monitor for s/sx bleeding F/u restarting oral anticoagulant if able  [name] PharmD., BCPS Clinical Pharmacist 10/22/2018 11:29 AM

## 2018-10-22 NOTE — Progress Notes (Signed)
[facility]    NEPHROLOGY PROGRESS NOTE  SUBJECTIVE:   She has been off of CRRT since afternoon 6/25.  Anuric.  She has been off of pressors all night.  Spoke with RN.  No dark stools.  95/45 and HR 53 on my exam.  Imported HR below does not appear accurate.  Review of systems:  Denies n/v Denies air hunger No pain Slept some   OBJECTIVE:  Vitals:   10/22/18 0600 10/22/18 0620  BP: (!) 84/41 (!) 95/45  Pulse: (!) 48 (!) 117  Resp: (!) 24 19  Temp:    SpO2: (!) 89% 90%    Intake/Output Summary (Last 24 hours) at 10/22/2018 0631 Last data filed at 10/22/2018 0600 Gross per 24 hour  Intake 1351.75 ml  Output -  Net 1351.75 ml      General: adult female in bed on trach collar HEENT: MMM [place] AT anicteric sclera Neck:  positive tracheostomy CV:  Bradycardic  Lungs:  Clear anteriorly  Abd:  abd SNT/ND Extremities:no pitting edema  Access: right subclavian tunneled catheter   MEDICATIONS:  . aspirin  81 mg Per Tube Daily  . atorvastatin  40 mg Oral q1800  . chlorhexidine gluconate (MEDLINE KIT)  15 mL Mouth Rinse BID  . Chlorhexidine Gluconate Cloth  6 each Topical Daily  . Chlorhexidine Gluconate Cloth  6 each Topical Q0600  . feeding supplement (PRO-STAT SUGAR FREE 64)  30 mL Per Tube TID  . heparin      . insulin aspart  0-20 Units Subcutaneous Q4H  . insulin detemir  30 Units Subcutaneous BID  . magic mouthwash  2 mL Oral TID  . mouth rinse  15 mL Mouth Rinse 10 times per day  . midodrine  15 mg Oral TID WC  . polyethylene glycol  17 g Oral Daily  . sodium chloride flush  10-40 mL Intracatheter Q12H       LABS:  CBC Latest Ref Rng & Units 10/22/2018 10/21/2018 10/20/2018  WBC 4.0 - 10.5 K/uL 12.4(H) 14.8(H) 21.3(H)  Hemoglobin 12.0 - 15.0 g/dL 7.9(L) 8.1(L) 9.4(L)  Hematocrit 36.0 - 46.0 % 28.3(L) 29.2(L) 33.3(L)  Platelets 150 - 400 K/uL 144(L) 145(L) 180    CMP Latest Ref Rng & Units 10/22/2018 10/21/2018 10/20/2018  Glucose 70 - 99 mg/dL  184(H) 140(H) 119(H)  BUN 8 - 23 mg/dL 72(H) 44(H) 30(H)  Creatinine 0.44 - 1.00 mg/dL 3.58(H) 2.35(H) 1.83(H)  Sodium 135 - 145 mmol/L 132(L) 136 139  Potassium 3.5 - 5.1 mmol/L 4.8 4.7 4.9  Chloride 98 - 111 mmol/L 100 103 107  CO2 22 - 32 mmol/L 23 24 26   Calcium 8.9 - 10.3 mg/dL 8.3(L) 8.2(L) 8.2(L)  Total Protein 6.5 - 8.1 g/dL - - -  Total Bilirubin 0.3 - 1.2 mg/dL - - -  Alkaline Phos 38 - 126 U/L - - -  AST 15 - 41 U/L - - -  ALT 0 - 44 U/L - - -    Lab Results  Component Value Date   PTH 33 10/12/2018   CALCIUM 8.3 (L) 10/22/2018   CAION 1.17 10/18/2018   PHOS 4.7 (H) 10/22/2018       Component Value Date/Time   COLORURINE AMBER (A) 05/22/2013 1826   APPEARANCEUR HAZY (A) 05/22/2013 1826   LABSPEC 1.024 05/22/2013 1826   PHURINE 6.0 05/22/2013 1826   GLUCOSEU NEGATIVE 05/22/2013 1826   HGBUR NEGATIVE 05/22/2013 1826   BILIRUBINUR SMALL (A) 05/22/2013 1826   Benjamin Stain  NEGATIVE 05/22/2013 1826   PROTEINUR 100 (A) 05/22/2013 1826   UROBILINOGEN 4.0 (H) 05/22/2013 1826   NITRITE NEGATIVE 05/22/2013 1826   LEUKOCYTESUR NEGATIVE 05/22/2013 1826      Component Value Date/Time   PHART 7.354 10/18/2018 1817   PCO2ART 48.5 (H) 10/18/2018 1817   PO2ART 45.0 (L) 10/18/2018 1817   HCO3 27.0 10/18/2018 1817   TCO2 28 10/18/2018 1817   ACIDBASEDEF 1.0 10/12/2018 0433   O2SAT 78.0 10/18/2018 1817       Component Value Date/Time   IRON 44 10/12/2018 1640   TIBC 379 10/12/2018 1640   IRONPCTSAT 12 10/12/2018 1640      ASSESSMENT/PLAN:    68 year old female patient with chronic diastolic heart failure who failed outpatient diuretic therapy underwent a right heart catheterization on 5/29 showing markedly elevated biventricular filling pressures and had a subsequent PEA arrest on 5/30.  She was intubated and ROSC was achieved.  She was started on CRRT on 5/31 for acute kidney injury.  She has had an ongoing pressor requirement.  1.  Acute kidney injury.  ATN 2/2 shock.  As  of transfer of her care on 6/23, she had lost about 39 kg to date with CRRT.  Right subclavian tunneled catheter  - Attempt intermittent HD today.  She has been on midodrine to help wean low dose levo requirement.  Spoke with nurse and they will try to time a dose of midodrine at the start of dialysis  2.  Chronic kidney disease stage IV with a reported baseline serum creatinine around 1.7-1.9.  CKD 2/2 DM and HTN.  Intact PTH 33 on 10/12/2018.   3.  Chronic hypoxic respiratory failure.  Status post tracheostomy  4. Chronic diastolic heart failure.  Optimize volume with renal replacement therapy.    5. Normocytic anemia - No indication for PRBC's but trending down.  S/p Aranesp 40 mcg once 6/25 and have increased her to 100 mcg for her weekly doses  6. Bradycardia - previously on dopamine, now off; stable  Claudia Desanctis 10/22/2018  6:31 AM

## 2018-10-23 DIAGNOSIS — E1169 Type 2 diabetes mellitus with other specified complication: Secondary | ICD-10-CM

## 2018-10-23 DIAGNOSIS — Z794 Long term (current) use of insulin: Secondary | ICD-10-CM

## 2018-10-23 DIAGNOSIS — J95851 Ventilator associated pneumonia: Secondary | ICD-10-CM

## 2018-10-23 LAB — GLUCOSE, CAPILLARY
Glucose-Capillary: 180 mg/dL — ABNORMAL HIGH (ref 70–99)
Glucose-Capillary: 219 mg/dL — ABNORMAL HIGH (ref 70–99)
Glucose-Capillary: 166 mg/dL — ABNORMAL HIGH (ref 70–99)
Glucose-Capillary: 152 mg/dL — ABNORMAL HIGH (ref 70–99)
Glucose-Capillary: 175 mg/dL — ABNORMAL HIGH (ref 70–99)

## 2018-10-23 LAB — RENAL FUNCTION PANEL
Albumin: 1.8 g/dL — ABNORMAL LOW (ref 3.5–5.0)
Anion gap: 11 (ref 5–15)
BUN: 46 mg/dL — ABNORMAL HIGH (ref 8–23)
CO2: 26 mmol/L (ref 22–32)
Calcium: 8.2 mg/dL — ABNORMAL LOW (ref 8.9–10.3)
Chloride: 95 mmol/L — ABNORMAL LOW (ref 98–111)
Creatinine, Ser: 2.89 mg/dL — ABNORMAL HIGH (ref 0.44–1.00)
GFR calc Af Amer: 19 mL/min — ABNORMAL LOW (ref >60)
GFR calc non Af Amer: 16 mL/min — ABNORMAL LOW (ref >60)
Glucose, Bld: 180 mg/dL — ABNORMAL HIGH (ref 70–99)
Phosphorus: 3.5 mg/dL (ref 2.5–4.6)
Potassium: 4.2 mmol/L (ref 3.5–5.1)
Sodium: 132 mmol/L — ABNORMAL LOW (ref 135–145)

## 2018-10-23 LAB — HEPARIN LEVEL (UNFRACTIONATED): Heparin Unfractionated: 0.24 IU/mL — ABNORMAL LOW (ref 0.30–0.70)

## 2018-10-23 MED ORDER — CHLORHEXIDINE GLUCONATE CLOTH 2 % EX PADS: 6.0000 | MEDICATED_PAD | Freq: Every day | CUTANEOUS | Status: DC

## 2018-10-23 MED ORDER — ALTEPLASE 2 MG IJ SOLR
2.0000 mg | Freq: Once | INTRAMUSCULAR | Status: AC
  Administered 2018-10-23: 2 mg

## 2018-10-23 MED ORDER — INSULIN DETEMIR 100 UNIT/ML ~~LOC~~ SOLN
33.0000 [IU] | Freq: Two times a day (BID) | SUBCUTANEOUS | Status: DC
  Administered 2018-10-23 – 2018-10-24 (×3): 33 [IU] via SUBCUTANEOUS
  Filled 2018-10-23 (×5): qty 0.33

## 2018-10-23 MED ORDER — SODIUM CHLORIDE 0.9 % IV SOLN
510.0000 mg | Freq: Once | INTRAVENOUS | Status: AC
  Administered 2018-10-23: 510 mg via INTRAVENOUS
  Filled 2018-10-23: qty 17

## 2018-10-23 NOTE — Progress Notes (Signed)
.  PROGRESS NOTE    Kathleen [name]  MRN:[id number] DOB: 09/05/1950 DOA: 09/22/2018 PCP: Narendra, Nischal, MD   Brief Narrative:   67 y/o obese woman with chronic diastolic heart failure who failed outpatient diuretics management due to chronic kidney disease.  Right heart cath on 5/29 showed markedly elevated biventricular filling pressures with normal cardiac output.  She was on cardiology service, maintained on milrinone and Lasix.  On the morning of 5/30 she went into PEA arrest, CPR performed for 8 minutes.  Intubated and transferred to ICU.  Started on CRRT 5/31.  Extubated 6/3.  Re-intubated early am 6/11 for hypercarbic respiratory failure and underwent tracheostomy on 6/19   Assessment & Plan:   Active Problems:   Type 2 diabetes mellitus with other specified complication (HCC)   Dyslipidemia   OBESITY   Acute on chronic diastolic CHF (congestive heart failure) (HCC)   Acute kidney injury superimposed on chronic kidney disease (HCC)   Acute respiratory failure with hypoxia (HCC)   VAP (ventilator-associated pneumonia) (HCC)   Acute on chronic respiratory failure w/ hypoxia and hypercapneia     - prolonged mechanical ventilation     - now s/p trach (10/14/18)     - COVID negative     - secondary to decompensated HF     - PCCM still following trach  Proteus VAP/MSSA PNA     - abx course completed  PEA Arrest Acute on chronic diastolic HF PAH Afib/VT Chronic bradycardia     - ASA, atorvastatin     - unable to use ACEi/BB right d/t hypotension/bradycardia     - on heparin gtt for AC  AKI on CKD4     - per nephrology     - midrodrine w/ iHD  DM2/HLD     - levemir 30 un BID; increase to 33 un , SSI     - monitor     - atorvastatin  Morbid obesity     - monitor caloric intake  Normocytic anemia     - no frank bleed noted; monitor   DVT prophylaxis: heparin Code Status: FULL   Disposition Plan: TBD   Consultants:   Nephrology  PCCM    Subjective:  No acute events ON per nursing  Objective: Vitals:   10/23/18 0900 10/23/18 1100 10/23/18 1116 10/23/18 1200  BP: (!) 110/44 (!) 110/51  111/84  Pulse: (!) 49 (!) 45 (!) 45 (!) 46  Resp: 20 (!) 22 19 13  Temp:  97.7 F (36.5 C)    TempSrc:  Oral    SpO2:  (!) 75% 93% 95%  Weight:      Height:        Intake/Output Summary (Last 24 hours) at 10/23/2018 1310 Last data filed at 10/23/2018 1200 Gross per 24 hour  Intake 1295.99 ml  Output -  Net 1295.99 ml   Filed Weights   10/21/18 0159 10/22/18 0700 10/22/18 1018  Weight: 99.1 kg 101.4 kg 100.9 kg    Examination:  General: 68 y.o. female resting in bed in NAD Cardiovascular: brady, diminshed, +S1, S2, no m/g/r, equal pulses throughout, R Starr TC Respiratory: CTABL, no w/r/r, trach'd GI: BS+, NDNT, no masses noted, no organomegaly noted, morbidly obese MSK: BLE dressing C/D/I Skin: No rashes, bruises, ulcerations noted Neuro: somenolent   Data Reviewed: I have personally reviewed following labs and imaging studies.  CBC: Recent Labs  Lab 10/18/18 0348 10/18/18 1817 10/19/18 0349 10/20/18 0417 10/21/18 0200 10/22/18 0316  WBC   33.5*  --  25.7* 21.3* 14.8* 12.4*  NEUTROABS  --   --  21.0* 16.4*  --   --   HGB 9.7* 12.6 10.3* 9.4* 8.1* 7.9*  HCT 34.6* 37.0 35.8* 33.3* 29.2* 28.3*  MCV 93.5  --  94.0 94.1 95.7 95.6  PLT 154  --  184 180 145* 549*   Basic Metabolic Panel: Recent Labs  Lab 10/17/18 0340  10/18/18 0348  10/19/18 0349  10/20/18 0417 10/20/18 1733 10/21/18 0200 10/22/18 0316 10/23/18 0310  NA 132*   < > 133*   < > 134*   < > 134* 139 136 132* 132*  K 4.2   < > 4.2   < > 4.8   < > 4.6 4.9 4.7 4.8 4.2  CL 103   < > 101   < > 101   < > 101 107 103 100 95*  CO2 23   < > 26   < > 25   < > _0 GLUCOSE 230*   < > 197*   < > 232*   < > 195* 119* 140* 184* 180*  BUN 25*   < > 28*   < > 27*   < > 23 30* 44* 72* 46*  CREATININE 1.13*   < > 1.38*   < > 1.27*   < > 1.45* 1.83* 2.35* 3.58* 2.89*   CALCIUM 7.5*   < > 7.8*   < > 8.0*   < > 7.8* 8.2* 8.2* 8.3* 8.2*  MG 2.3  --  2.6*  --  2.7*  --  2.6*  --  2.7*  --   --   PHOS 2.5   < > 2.4*   < > 2.4*   < > 3.3 3.2 4.0 4.7* 3.5   < > = values in this interval not displayed.   GFR: Estimated Creatinine Clearance: 20.2 mL/min (A) (by C-G formula based on SCr of 2.89 mg/dL (H)). Liver Function Tests: Recent Labs  Lab 10/20/18 0417 10/20/18 1733 10/21/18 0200 10/22/18 0316 10/23/18 0310  ALBUMIN 1.7* 1.7* 2.0* 1.9* 1.8*   No results for input(s): LIPASE, AMYLASE in the last 168 hours. No results for input(s): AMMONIA in the last 168 hours. Coagulation Profile: No results for input(s): INR, PROTIME in the last 168 hours. Cardiac Enzymes: No results for input(s): CKTOTAL, CKMB, CKMBINDEX, TROPONINI in the last 168 hours. BNP (last 3 results) No results for input(s): PROBNP in the last 8760 hours. HbA1C: No results for input(s): HGBA1C in the last 72 hours. CBG: Recent Labs  Lab 10/22/18 1922 10/22/18 2311 10/23/18 0316 10/23/18 0717 10/23/18 1110  GLUCAP 212* 149* 180* 219* 166*   Lipid Profile: No results for input(s): CHOL, HDL, LDLCALC, TRIG, CHOLHDL, LDLDIRECT in the last 72 hours. Thyroid Function Tests: No results for input(s): TSH, T4TOTAL, FREET4, T3FREE, THYROIDAB in the last 72 hours. Anemia Panel: No results for input(s): VITAMINB12, FOLATE, FERRITIN, TIBC, IRON, RETICCTPCT in the last 72 hours. Sepsis Labs: Recent Labs  Lab 10/17/18 0340 10/18/18 0348 10/19/18 0349  PROCALCITON 0.40 0.32 0.29    Recent Results (from the past 240 hour(s))  Culture, blood (routine x 2)     Status: None   Collection Time: 10/14/18 12:09 PM   Specimen: BLOOD  Result Value Ref Range Status   Specimen Description BLOOD RIGHT ANTECUBITAL  Final   Special Requests   Final    BOTTLES DRAWN AEROBIC AND ANAEROBIC Blood Culture adequate  volume   Culture   Final    NO GROWTH 5 DAYS Performed at [facility],  [address] [place], [place] [address]    Report Status 10/19/2018 FINAL  Final  Culture, blood (routine x 2)     Status: None   Collection Time: 10/14/18 12:26 PM   Specimen: BLOOD RIGHT HAND  Result Value Ref Range Status   Specimen Description BLOOD RIGHT HAND  Final   Special Requests   Final    BOTTLES DRAWN AEROBIC AND ANAEROBIC Blood Culture results may not be optimal due to an inadequate volume of blood received in culture bottles   Culture   Final    NO GROWTH 5 DAYS Performed at [facility], [address] [place], [place] [address]    Report Status 10/19/2018 FINAL  Final  Culture, respiratory (non-expectorated)     Status: None   Collection Time: 10/17/18 11:50 AM   Specimen: Tracheal Aspirate; Respiratory  Result Value Ref Range Status   Specimen Description TRACHEAL ASPIRATE  Final   Special Requests NONE  Final   Gram Stain NO WBC SEEN RARE GRAM POSITIVE COCCI   Final   Culture   Final    Consistent with normal respiratory flora. Performed at [facility], [address] [place], [place] [address]    Report Status 10/19/2018 FINAL  Final     Radiology Studies: No results found.   Scheduled Meds: . aspirin  81 mg Per Tube Daily  . atorvastatin  40 mg Oral q1800  . chlorhexidine gluconate (MEDLINE KIT)  15 mL Mouth Rinse BID  . Chlorhexidine Gluconate Cloth  6 each Topical Daily  . Chlorhexidine Gluconate Cloth  6 each Topical Q0600  . [START ON 10/26/2018] darbepoetin (ARANESP) injection - DIALYSIS  100 mcg Intravenous Q Wed-HD  . feeding supplement (PRO-STAT SUGAR FREE 64)  30 mL Per Tube TID  . insulin aspart  0-20 Units Subcutaneous Q4H  . insulin detemir  30 Units Subcutaneous BID  . magic mouthwash  2 mL Oral TID  . mouth rinse  15 mL Mouth Rinse 10 times per day  . midodrine  15 mg Oral TID WC  . polyethylene glycol  17 g Oral Daily  . sodium chloride flush  10-40 mL Intracatheter Q12H   Continuous Infusions: . sodium chloride  Stopped (10/20/18 1934)  . sodium chloride    . sodium chloride    . famotidine (PEPCID) IV 20 mg (10/22/18 1442)  . feeding supplement (VITAL 1.5 CAL) 40 mL/hr at 10/23/18 1200  . heparin 1,650 Units/hr (10/23/18 1200)     LOS: 31 days    Time spent: 35 minutes spent in the coordination of care today.     A , DO [facility] Pager [phone]  If 7PM-7AM, please contact night-coverage [email or web address] Password [email or web address] 10/23/2018, 1:10 PM   

## 2018-10-23 NOTE — Progress Notes (Signed)
After consult for PICC not returning blood, chest x-rays reviewed. Looking at the CXR, it looks like the tip has changed position. Kathleen [name], PICC nurse looked at CXR and recommended the PICC to be replaced or removed. RN on 2H made aware. She said that Dr. Haroldine Laws will be back tomorrow and will let him know then. They are currently only using PICC for blood draws since heparin is infusing through PIV. RN aware not to use PICC until she talks to the doctor to decide on replacement vs. Removal.

## 2018-10-23 NOTE — Progress Notes (Signed)
[facility]    NEPHROLOGY PROGRESS NOTE  SUBJECTIVE:   Had first intermittent HD on 6/27 with 0.5 kg UF.  Her blood pressure has been 70-80 charted overnight when sleeping.  HR 40's sleeping per nursing.  On my exam 103/41 and HR 48 while awake.  Hasn't had midodrine yet today.  On 60% oxygen via trach collar  Review of systems:  Denies n/v Endorses some shortness of breath No pain headache  OBJECTIVE:  Vitals:   10/23/18 0300 10/23/18 0400  BP: (!) 85/40 (!) 76/44  Pulse: (!) 49 61  Resp: 20 20  Temp: 98 F (36.7 C)   SpO2: 93% 94%    Intake/Output Summary (Last 24 hours) at 10/23/2018 0644 Last data filed at 10/23/2018 0400 Gross per 24 hour  Intake 1509.91 ml  Output 500 ml  Net 1009.91 ml      General: adult female in bed on trach collar HEENT: MMM [place] AT anicteric sclera Neck:  positive tracheostomy CV:  Bradycardic Lungs:  Clear anteriorly  Abd:  abd SNT/ND Extremities:no pitting edema; lower extremities wrapped  Access: right subclavian tunneled catheter   MEDICATIONS:  . aspirin  81 mg Per Tube Daily  . atorvastatin  40 mg Oral q1800  . chlorhexidine gluconate (MEDLINE KIT)  15 mL Mouth Rinse BID  . Chlorhexidine Gluconate Cloth  6 each Topical Daily  . Chlorhexidine Gluconate Cloth  6 each Topical Q0600  . [START ON 10/26/2018] darbepoetin (ARANESP) injection - DIALYSIS  100 mcg Intravenous Q Wed-HD  . feeding supplement (PRO-STAT SUGAR FREE 64)  30 mL Per Tube TID  . insulin aspart  0-20 Units Subcutaneous Q4H  . insulin detemir  30 Units Subcutaneous BID  . magic mouthwash  2 mL Oral TID  . mouth rinse  15 mL Mouth Rinse 10 times per day  . midodrine  15 mg Oral TID WC  . polyethylene glycol  17 g Oral Daily  . sodium chloride flush  10-40 mL Intracatheter Q12H      LABS:  CBC Latest Ref Rng & Units 10/22/2018 10/21/2018 10/20/2018  WBC 4.0 - 10.5 K/uL 12.4(H) 14.8(H) 21.3(H)  Hemoglobin 12.0 - 15.0 g/dL 7.9(L) 8.1(L) 9.4(L)   Hematocrit 36.0 - 46.0 % 28.3(L) 29.2(L) 33.3(L)  Platelets 150 - 400 K/uL 144(L) 145(L) 180    CMP Latest Ref Rng & Units 10/23/2018 10/22/2018 10/21/2018  Glucose 70 - 99 mg/dL 180(H) 184(H) 140(H)  BUN 8 - 23 mg/dL 46(H) 72(H) 44(H)  Creatinine 0.44 - 1.00 mg/dL 2.89(H) 3.58(H) 2.35(H)  Sodium 135 - 145 mmol/L 132(L) 132(L) 136  Potassium 3.5 - 5.1 mmol/L 4.2 4.8 4.7  Chloride 98 - 111 mmol/L 95(L) 100 103  CO2 22 - 32 mmol/L 26 23 24   Calcium 8.9 - 10.3 mg/dL 8.2(L) 8.3(L) 8.2(L)  Total Protein 6.5 - 8.1 g/dL - - -  Total Bilirubin 0.3 - 1.2 mg/dL - - -  Alkaline Phos 38 - 126 U/L - - -  AST 15 - 41 U/L - - -  ALT 0 - 44 U/L - - -    Lab Results  Component Value Date   PTH 33 10/12/2018   CALCIUM 8.2 (L) 10/23/2018   CAION 1.17 10/18/2018   PHOS 3.5 10/23/2018       Component Value Date/Time   COLORURINE AMBER (A) 05/22/2013 1826   APPEARANCEUR HAZY (A) 05/22/2013 1826   LABSPEC 1.024 05/22/2013 1826   PHURINE 6.0 05/22/2013 1826   GLUCOSEU NEGATIVE 05/22/2013 1826  HGBUR NEGATIVE 05/22/2013 1826   BILIRUBINUR SMALL (A) 05/22/2013 1826   KETONESUR NEGATIVE 05/22/2013 1826   PROTEINUR 100 (A) 05/22/2013 1826   UROBILINOGEN 4.0 (H) 05/22/2013 1826   NITRITE NEGATIVE 05/22/2013 1826   LEUKOCYTESUR NEGATIVE 05/22/2013 1826      Component Value Date/Time   PHART 7.354 10/18/2018 1817   PCO2ART 48.5 (H) 10/18/2018 1817   PO2ART 45.0 (L) 10/18/2018 1817   HCO3 27.0 10/18/2018 1817   TCO2 28 10/18/2018 1817   ACIDBASEDEF 1.0 10/12/2018 0433   O2SAT 78.0 10/18/2018 1817       Component Value Date/Time   IRON 44 10/12/2018 1640   TIBC 379 10/12/2018 1640   IRONPCTSAT 12 10/12/2018 1640      ASSESSMENT/PLAN:    68 year old female patient with chronic diastolic heart failure who failed outpatient diuretic therapy underwent a right heart catheterization on 5/29 showing markedly elevated biventricular filling pressures and had a subsequent PEA arrest on 5/30.  She was  intubated and ROSC was achieved.  She was started on CRRT on 5/31 for acute kidney injury.  She has had an ongoing pressor requirement.  1.  Acute kidney injury.  ATN 2/2 shock.  Started on CRRT 5/31 and transitioned to [facility] on 6/27. Significant fluid removal with pt 136 kg on 5/28 and 100.9 kg on 6/27 - weight approximately stable the past several days given hypotension and to ease transition to iHD.  Using right subclavian tunneled catheter.   - no acute indication for HD today. - Plan for another intermittent HD treatment on 6/29.  Hopeful she will tolerate  - She has been on midodrine - time dose prior to HD  - If she does tolerate continued HD, she would eventually need facility with trach and HD capabilties.  Still with marked hypotension despite cutting back on UF this past week with CRRT and with attempting transition to iHD  2.  Chronic kidney disease stage IV with a baseline serum creatinine around 1.7-1.9.  CKD 2/2 DM and HTN.  Intact PTH 33 on 10/12/2018.   3.  Chronic hypoxic respiratory failure.  Status post tracheostomy  4. Chronic diastolic heart failure.  Optimize volume with HD/RRT  5. Normocytic anemia - No indication for PRBC's but trending down.  S/p Aranesp 40 mcg once 6/25 and have increased her to 100 mcg for her weekly doses .  Repeat feraheme 6/28.   6. Bradycardia - previously on dopamine, now off; stable   Claudia Desanctis 10/23/2018  6:44 AM

## 2018-10-23 NOTE — Progress Notes (Signed)
[place] for Heparin Indication: atrial fibrillation  Allergies  Allergen Reactions  . Doxycycline     REACTION: Wheals and pruritus    Patient Measurements: Height: 5' (152.4 cm) Weight: 222 lb 7.1 oz (100.9 kg) IBW/kg (Calculated) : 45.5  HEPARIN DW (KG): 71.8  Vital Signs: Temp: 97.9 F (36.6 C) (06/28 0719) Temp Source: Oral (06/28 0719) BP: 103/41 (06/28 0700) Pulse Rate: 45 (06/28 0700)  Labs: Recent Labs    10/21/18 0200  10/21/18 1922 10/22/18 0316 10/23/18 0310  HGB 8.1*  --   --  7.9*  --   HCT 29.2*  --   --  28.3*  --   PLT 145*  --   --  144*  --   HEPARINUNFRC 0.19*   < > 0.41 0.31 0.24*  CREATININE 2.35*  --   --  3.58* 2.89*   < > = values in this interval not displayed.    Estimated Creatinine Clearance: 20.2 mL/min (A) (by C-G formula based on SCr of 2.89 mg/dL (H)).  Assessment: 68 y.o. female with h/o Afib, (PTA Eliquis on hold) s/p trach placement.  Pharmacy has been consulted to dose heparin.   Hep level below goal 0.24. No bleeding issues noted.   Goal of Therapy:  Heparin level 0.3-0.7 units/ml  Monitor platelets by anticoagulation protocol: Yes  Plan:  Heparin increased to 1650 units/hr Daily CBC and heparin level while on heparin Monitor for s/sx bleeding F/u restarting oral anticoagulant when able  [name] PharmD., BCPS Clinical Pharmacist 10/23/2018 7:49 AM

## 2018-10-23 NOTE — Progress Notes (Signed)
Patient assisted up to chair with maxi sky lift. Tolerated well. Anastasia able to video call while patient up in chair. Continue to monitor. Ellamae Sia

## 2018-10-24 ENCOUNTER — Inpatient Hospital Stay (HOSPITAL_COMMUNITY): Payer: Medicare HMO

## 2018-10-24 DIAGNOSIS — Z6841 Body Mass Index (BMI) 40.0 and over, adult: Secondary | ICD-10-CM

## 2018-10-24 DIAGNOSIS — E785 Hyperlipidemia, unspecified: Secondary | ICD-10-CM

## 2018-10-24 LAB — HEPATITIS B CORE ANTIBODY, IGM: Hep B C IgM: NEGATIVE

## 2018-10-24 LAB — HEMOGLOBIN AND HEMATOCRIT, BLOOD
HCT: 27 % — ABNORMAL LOW (ref 36.0–46.0)
Hemoglobin: 7.7 g/dL — ABNORMAL LOW (ref 12.0–15.0)

## 2018-10-24 LAB — GLUCOSE, CAPILLARY
Glucose-Capillary: 131 mg/dL — ABNORMAL HIGH (ref 70–99)
Glucose-Capillary: 161 mg/dL — ABNORMAL HIGH (ref 70–99)
Glucose-Capillary: 166 mg/dL — ABNORMAL HIGH (ref 70–99)
Glucose-Capillary: 182 mg/dL — ABNORMAL HIGH (ref 70–99)
Glucose-Capillary: 200 mg/dL — ABNORMAL HIGH (ref 70–99)
Glucose-Capillary: 212 mg/dL — ABNORMAL HIGH (ref 70–99)
Glucose-Capillary: 243 mg/dL — ABNORMAL HIGH (ref 70–99)

## 2018-10-24 LAB — POCT I-STAT 7, (LYTES, BLD GAS, ICA,H+H)
Acid-base deficit: 1 mmol/L (ref 0.0–2.0)
Bicarbonate: 25.2 mmol/L (ref 20.0–28.0)
Calcium, Ion: 1.23 mmol/L (ref 1.15–1.40)
HCT: 29 % — ABNORMAL LOW (ref 36.0–46.0)
Hemoglobin: 9.9 g/dL — ABNORMAL LOW (ref 12.0–15.0)
O2 Saturation: 92 %
Patient temperature: 97.8
Potassium: 4.8 mmol/L (ref 3.5–5.1)
Sodium: 130 mmol/L — ABNORMAL LOW (ref 135–145)
TCO2: 27 mmol/L (ref 22–32)
pCO2 arterial: 48.7 mmHg — ABNORMAL HIGH (ref 32.0–48.0)
pH, Arterial: 7.32 — ABNORMAL LOW (ref 7.350–7.450)
pO2, Arterial: 69 mmHg — ABNORMAL LOW (ref 83.0–108.0)

## 2018-10-24 LAB — CBC
HCT: 30.3 % — ABNORMAL LOW (ref 36.0–46.0)
Hemoglobin: 8.8 g/dL — ABNORMAL LOW (ref 12.0–15.0)
MCH: 27.6 pg (ref 26.0–34.0)
MCHC: 29 g/dL — ABNORMAL LOW (ref 30.0–36.0)
MCV: 95 fL (ref 80.0–100.0)
Platelets: 144 10*3/uL — ABNORMAL LOW (ref 150–400)
RBC: 3.19 MIL/uL — ABNORMAL LOW (ref 3.87–5.11)
RDW: 27.6 % — ABNORMAL HIGH (ref 11.5–15.5)
WBC: 13.2 10*3/uL — ABNORMAL HIGH (ref 4.0–10.5)
nRBC: 0.6 % — ABNORMAL HIGH (ref 0.0–0.2)

## 2018-10-24 LAB — RENAL FUNCTION PANEL
Albumin: 1.8 g/dL — ABNORMAL LOW (ref 3.5–5.0)
Anion gap: 9 (ref 5–15)
BUN: 72 mg/dL — ABNORMAL HIGH (ref 8–23)
CO2: 25 mmol/L (ref 22–32)
Calcium: 8.6 mg/dL — ABNORMAL LOW (ref 8.9–10.3)
Chloride: 97 mmol/L — ABNORMAL LOW (ref 98–111)
Creatinine, Ser: 3.89 mg/dL — ABNORMAL HIGH (ref 0.44–1.00)
GFR calc Af Amer: 13 mL/min — ABNORMAL LOW (ref >60)
GFR calc non Af Amer: 11 mL/min — ABNORMAL LOW (ref >60)
Glucose, Bld: 186 mg/dL — ABNORMAL HIGH (ref 70–99)
Phosphorus: 4.2 mg/dL (ref 2.5–4.6)
Potassium: 4.7 mmol/L (ref 3.5–5.1)
Sodium: 131 mmol/L — ABNORMAL LOW (ref 135–145)

## 2018-10-24 LAB — MAGNESIUM: Magnesium: 2.5 mg/dL — ABNORMAL HIGH (ref 1.7–2.4)

## 2018-10-24 LAB — HEPATITIS B SURFACE ANTIBODY,QUALITATIVE: Hep B S Ab: NONREACTIVE

## 2018-10-24 LAB — HEPARIN LEVEL (UNFRACTIONATED): Heparin Unfractionated: 0.43 IU/mL (ref 0.30–0.70)

## 2018-10-24 LAB — HEPATITIS B SURFACE ANTIGEN: Hepatitis B Surface Ag: NEGATIVE

## 2018-10-24 MED ORDER — HEPARIN SODIUM (PORCINE) 1000 UNIT/ML IJ SOLN
3800.0000 [IU] | Freq: Once | INTRAMUSCULAR | Status: AC
  Administered 2018-10-24: 3800 [IU]

## 2018-10-24 MED ORDER — CHLORHEXIDINE GLUCONATE 0.12 % MT SOLN
OROMUCOSAL | Status: AC
  Administered 2018-10-24: 15 mL
  Filled 2018-10-24: qty 15

## 2018-10-24 MED ORDER — HEPARIN SODIUM (PORCINE) 1000 UNIT/ML IJ SOLN
INTRAMUSCULAR | Status: AC
  Filled 2018-10-24: qty 4

## 2018-10-24 NOTE — Progress Notes (Signed)
PT Cancellation Note  Patient Details Name: Kathleen [name] MRN: [id number] DOB: 1950/05/11   Cancelled Treatment:    Reason Eval/Treat Not Completed: Medical issues which prohibited therapy  See RT note of 1615. Patient just back on ventilator; SaO2 90%. RN agreed should defer PT at this time.  Jeanie Cooks Joslynne Klatt, PT 10/24/2018, 4:31 PM

## 2018-10-24 NOTE — Progress Notes (Signed)
[facility] NEPHROLOGY PROGRESS NOTE  Assessment/ Plan: Pt is a 68 y.o. yo female with CHF failed outpatient diuretics therapy underwent RHC on 5/29 with markedly elevated filling pressure, subsequent PEA arrest on 5/30, intubated and ROSC achieved.  She was a started on CRRT for AKI.  #Acute kidney injury likely ATN secondary to shock, anuric: Transitioned to [facility] on 6/27.  Plan for another iHD today.  Using right subclavian TDC.  She is on midodrine for hypotension, may need transient pressor support.  The blood pressure at bedside was 109/45.  #CKD stage IV with baseline creatinine around 1.7-1.9 due to diabetes and hypertension.    #Chronic respiratory failure: Status post tracheostomy, some bleeding around the trach site today and back to the vent.  Per PCCM.  #Chronic diastolic heart failure: Volume optimization during dialysis.  #Anemia due to chronic disease and acute blood loss: Continue ESA, had for him on 6/28. Monitor cbc.  #Bradycardia, PEA cardiac arrest  Subjective: Seen and examined at bedside.  Some bleeding around the trach site and had desaturation.  She is now back to the pain.  Feels weak.  No chest pain or shortness of breath. Objective Vital signs in last 24 hours: Vitals:   10/24/18 0500 10/24/18 0600 10/24/18 0700 10/24/18 0728  BP: (!) 106/49 (!) 93/56 (!) 106/39 (!) 106/39  Pulse: (!) 44 (!) 44 (!) 43 (!) 42  Resp: (!) 25 (!) 24 (!) 27 (!) 26  Temp:      TempSrc:      SpO2: (!) 89% (!) 88% (!) 82% 93%  Weight:      Height:       Weight change:   Intake/Output Summary (Last 24 hours) at 10/24/2018 0746 Last data filed at 10/24/2018 0600 Gross per 24 hour  Intake 1375.22 ml  Output -  Net 1375.22 ml       Labs: Basic Metabolic Panel: Recent Labs  Lab 10/22/18 0316 10/23/18 0310 10/24/18 0337  NA 132* 132* 131*  K 4.8 4.2 4.7  CL 100 95* 97*  CO2 23 26 25   GLUCOSE 184* 180* 186*  BUN 72* 46* 72*  CREATININE 3.58* 2.89*  3.89*  CALCIUM 8.3* 8.2* 8.6*  PHOS 4.7* 3.5 4.2   Liver Function Tests: Recent Labs  Lab 10/22/18 0316 10/23/18 0310 10/24/18 0337  ALBUMIN 1.9* 1.8* 1.8*   No results for input(s): LIPASE, AMYLASE in the last 168 hours. No results for input(s): AMMONIA in the last 168 hours. CBC: Recent Labs  Lab 10/19/18 0349 10/20/18 0417 10/21/18 0200 10/22/18 0316 10/24/18 0629  WBC 25.7* 21.3* 14.8* 12.4* 13.2*  NEUTROABS 21.0* 16.4*  --   --   --   HGB 10.3* 9.4* 8.1* 7.9* 8.8*  HCT 35.8* 33.3* 29.2* 28.3* 30.3*  MCV 94.0 94.1 95.7 95.6 95.0  PLT 184 180 145* 144* 144*   Cardiac Enzymes: No results for input(s): CKTOTAL, CKMB, CKMBINDEX, TROPONINI in the last 168 hours. CBG: Recent Labs  Lab 10/23/18 1110 10/23/18 1534 10/23/18 1935 10/24/18 0028 10/24/18 0418  GLUCAP 166* 152* 175* 212* 182*    Iron Studies: No results for input(s): IRON, TIBC, TRANSFERRIN, FERRITIN in the last 72 hours. Studies/Results: Dg Chest Port 1 View  Result Date: 10/24/2018 CLINICAL DATA:  Tracheostomy. EXAM: PORTABLE CHEST 1 VIEW COMPARISON:  10/21/2018. FINDINGS: Tracheostomy tube, feeding tube, right IJ line, left PICC line in stable position. Cardiomegaly unchanged. Diffuse bilateral pulmonary infiltrates/edema again noted. Low lung volumes. Similar findings noted on prior exam.  No prominent pleural effusion or pneumothorax. IMPRESSION: 1.  Lines and tubes stable position. 2.  Cardiomegaly unchanged. 3. Diffuse bilateral pulmonary infiltrates/edema again noted. Low lung volumes. Similar findings noted on prior exam. Electronically Signed   By: [place]   On: 10/24/2018 07:31    Medications: Infusions: . sodium chloride Stopped (10/20/18 1934)  . sodium chloride    . sodium chloride    . famotidine (PEPCID) IV 20 mg (10/23/18 1420)  . feeding supplement (VITAL 1.5 CAL) 40 mL/hr at 10/23/18 1200  . heparin 1,650 Units/hr (10/24/18 0600)    Scheduled Medications: . aspirin  81 mg  Per Tube Daily  . atorvastatin  40 mg Oral q1800  . chlorhexidine gluconate (MEDLINE KIT)  15 mL Mouth Rinse BID  . Chlorhexidine Gluconate Cloth  6 each Topical Daily  . [START ON 10/26/2018] darbepoetin (ARANESP) injection - DIALYSIS  100 mcg Intravenous Q Wed-HD  . feeding supplement (PRO-STAT SUGAR FREE 64)  30 mL Per Tube TID  . insulin aspart  0-20 Units Subcutaneous Q4H  . insulin detemir  33 Units Subcutaneous BID  . mouth rinse  15 mL Mouth Rinse 10 times per day  . midodrine  15 mg Oral TID WC  . polyethylene glycol  17 g Oral Daily  . sodium chloride flush  10-40 mL Intracatheter Q12H    have reviewed scheduled and prn medications.  Physical Exam: General:NAD, comfortable Neck: Tracheostomy site has some dried blood Heart:RRR, s1s2 nl, no rubs Lungs: Coarse breath sound bilateral, no wheezing Abdomen:soft, Non-tender, non-distended Extremities:No edema Dialysis Access: Right subclavian TDC.  Yoan Sallade Prasad Alicha Raspberry 10/24/2018,7:46 AM  LOS: 32 days  Pager: [phone]

## 2018-10-24 NOTE — Progress Notes (Signed)
Informed [facility] hospitalist of trach bleeding, bruising around breast and low SPo2. Stopped Heparin gtt. Will contact CCM for trach management.

## 2018-10-24 NOTE — Progress Notes (Signed)
SLP Cancellation Note  Patient Details Name: Kathleen [name] MRN: [id number] DOB: [birth year]   Cancelled treatment:       Reason Eval/Treat Not Completed: Medical issues which prohibited therapy - pt had respiratory distress this morning requiring return to vent. Will hold speech therapy for today per discussion with RN. Will f/u as able.   Kathleen [name] 10/24/2018, 10:31 AM  Pollyann Glen, M.A. [place] Acute Environmental education officer [phone] Office [phone]

## 2018-10-24 NOTE — Progress Notes (Signed)
RT came to change patient's trach setup due to water about to run out in humidity bottle.  While switching, patient's sats dropped to 78%, with a good waveform, patient's color began to change, and did not improve when placed on 98% FIO2 on trach collar.  Placed patient back on ventilator and suctioned copious amount of bloody secretions from trach.  Also gave patient 100% oxygen breath and changed pulse ox to a new finger.  Sats only reading 90% however sat goal for patient is >90%.  Patient states her breathing also feels better since being placed back on ventilator.  Will notify unit RT of events.  Will continue to monitor.

## 2018-10-24 NOTE — Progress Notes (Signed)
NAME:  Kathleen [name], MRN:  [id number], DOB:  1950/12/03, LOS: 46 ADMISSION DATE:  09/22/2018, CONSULTATION DATE: 09/24/2018 REFERRING MD: Candee Furbish MD, CHIEF COMPLAINT: Cardiac arrest  Brief History   68 y/o obese woman with chronic diastolic heart failure who failed outpatient diuretics management due to chronic kidney disease.  Right heart cath on 5/29 showed markedly elevated biventricular filling pressures with normal cardiac output.  She was on cardiology service, maintained on milrinone and Lasix.  On the morning of 5/30 she went into PEA arrest, CPR performed for 8 minutes.  Intubated and transferred to ICU.  Started on CRRT 5/31.  Extubated 6/3.  Re-intubated early am 6/11 for hypercarbic respiratory failure and underwent tracheostomy on 6/19  Past Medical History   has a past medical history of Atrial flutter ([place]), Bradycardia, Chronic diastolic (congestive) heart failure ([place]) (01/10/2015), CKD (chronic kidney disease), stage IV ([place]), Degenerative joint disease of hand, Diabetes mellitus, Dyslipidemia, Fecal occult blood test positive, GERD (gastroesophageal reflux disease), Headache, Hypertension, Inadequate material resources, Irritable bowel syndrome, Morbid obesity ([place]), Obesity hypoventilation syndrome ([place]), Post-menopausal bleeding, and Shortness of breath dyspnea.  Significant Hospital Events   5/28 Admit 5/29 RHC 5/30 PEA arrest, intubated/ CRRT 5/31 Milrinone stopped due to ectopy; brady episode- amio stopped 6/02 Attempt at SBT, chest x-ray with worsening pulmonary edema, CVVHD for volume removal 6/03 Extubated > remain on CRRT.  Refused nocturnal BiPAP 6/04 Refused nocturnal BiPAP 6/10 off CRRT 6/11 Intubated early am with hypercarbia, concern for RUL PNA; abx broadened; restarted on CRRT 6/16  No events overnight, weaning on high PS 6/17  failed vent wean, remains on levophed, midodrine and CRRT 6/18  Weaned for 5 hours on PSV 8/5 before requiring full support;  ongoing CRRT, remains on levophed 14 mcg 6/19 - TRACH + Dr Erskine Emery 6/20 -. On IV heparin gt, levophed gtt with midodrine. Off fent gtt. Pressopr needs down 6/26 - TCT tolerated for the past 24 hours  6/27 - Stable on TCT now >48hrs, tolerated first session of iHD   Consults:  Cardiology PCCM  Nephrology   Procedures:  Lt PICC 5/29 >> OETT 5/30 >> 6/3; 6/11 > 6/19 - TRACH (Dr Erskine Emery) >> R IJ HD cath 5/30 >> Aline left ulnar 5/30 >> 6/2  Significant Diagnostic Tests:  TTE 5/28 >>The left ventricle has normal systolic function with an ejection fraction of 60-65%. . The right ventricle has normal systolic function. The cavity was mildly enlarged.   Right heart cath 5/29 RA = 24 RV = 94/26 PA = 95/36 (53) PCW = 30 (v = 50) Fick cardiac output/index = 6.0/2.7 PVR =3.9 WU FA sat = 91% PA sat = 58%, 58% SVC 60%  CXR 6/15> Bilateral ASD LLL > R. ETT, GT, LIJ and RIJ CVCs stable position.   Micro Data:  SARS coronavirus 2 cepheid 5/28 >> neg MRSA PCR 5/28 >> neg Trach aspirate 6/2 >> MSSA BCx2 6/2 >> negative ...................... Tracheal aspirate 6/11 >> few GNR >> few proteus and few candida tropicalis BCx2 6/11 >> negative  Antimicrobials:  Vancomycin 6/3 > 6/4 Cefazolin 6/4 >> 6/10 ...........................[facility] Vanco 6/11 >> 6/17 Cefepime 6/11 >>6/18  Interim history/subjective:   Was placed on vent this morning  Objective   Blood pressure (!) 80/54, pulse (!) 49, temperature 97.8 F (36.6 C), temperature source Oral, resp. rate 19, height 5' (1.524 m), weight 100.9 kg, SpO2 94 %.    Vent Mode: PRVC FiO2 (%):  [50 %-60 %]  50 % Set Rate:  [20 bmp] 20 bmp Vt Set:  [360 mL] 360 mL PEEP:  [5 cmH20] 5 cmH20 Plateau Pressure:  [20 cmH20] 20 cmH20   Intake/Output Summary (Last 24 hours) at 10/24/2018 1005 Last data filed at 10/24/2018 0900 Gross per 24 hour  Intake 1592.38 ml  Output -  Net 1592.38 ml   Filed Weights   10/21/18 0159 10/22/18 0700  10/22/18 1018  Weight: 99.1 kg 101.4 kg 100.9 kg    General appearance: On ventilator this morning, appears comfortable Eyes: Anicteric HENT: Moist oral mucosa Neck: Trachea is midline with bleeding around tracheostomy site Lungs: Clear to auscultation bilaterally CV: S1-S2 appreciated Abdomen: Soft, non-tender; non-distended, BS present  Extremities: Warm, no edema Neuro: Arouses easily   CXR: Reviewed by myself, showing vascular congestion    Resolved Hospital Problem list    Assessment & Plan:   Acute on chronic respiratory failure following cardiac arrest -Currently on mechanical ventilation -She does tolerate trach collar -Arterial blood gases noted revealing hypercapnia-unchanged from previous -She was placed back on the ventilator this morning secondary to increased work of breathing which seemed to stabilize following being placed on the ventilator -May be placed back on trach collar this afternoon  Encephalopathy -Appears to be stabilizing -Minimize sedation -On Klonopin, Seroquel  Post PEA arrest -Hemodynamics have been stable -  Hypotension -On midodrine  Treated for pneumonia -MSSA pneumonia 6/2, Proteus ventilator associated pneumonia 6/11 -Respiratory status is stable  History of cardiorenal syndrome -On intermittent hemodialysis -She is anuric  Morbid obesity Diabetes -Continue insulin-on SSI  Bleeding from around trach site Extensive bruising under her breast -Heparin on hold at present -Follow H&H this afternoon  Place back on trach collar at noon  Best practice:  Diet: Continue tube feed Pain/Anxiety/Delirium protocol (if indicated):  In place  VAP protocol (if indicated): In place  DVT prophylaxis: Heparin gtt GI prophylaxis: Pepcid Glucose control: CXR Mobility: Bed Code Status: Full Disposition: ICU If were able to get off the vent this morning down will consider transfer to stepdown unit  The patient is critically ill with  multiple organ system failure and requires high complexity decision making for assessment and support, frequent evaluation and titration of therapies, advanced monitoring, review of radiographic studies and interpretation of complex data.    Critical Care Time devoted to patient care services, exclusive of separately billable procedures, described in this note is 30 minutes.  Sherrilyn Rist, MD [place], PCCM Cell: [phone]

## 2018-10-24 NOTE — Progress Notes (Signed)
CCM was called by RN due to desaturation issues with SAT's in the low 80's. CCM gave verbal orders to RN to place patient back on the ventilator. Upon RT arrival patient had increased work of breathing. RT changed patient's inner cannula, suctioned & placed patient back on the ventilator. Patient's SAT's & work of breathing have now improved.

## 2018-10-24 NOTE — Progress Notes (Addendum)
[facility]  PROGRESS NOTE    Kathleen [name]  XBD:[id number] DOB: 25-Mar-1951 DOA: 09/22/2018 PCP: Aldine Contes, MD   Brief Narrative:   68 y/o obese woman with chronic diastolic heart failure who failed outpatient diuretics management due to chronic kidney disease. Right heart cath on 5/29 showed markedly elevated biventricular filling pressures with normal cardiac output. She was on cardiology service, maintained on milrinone and Lasix. On the morning of 5/30 she went into PEA arrest, CPR performed for 8 minutes. Intubated and transferred to ICU. Started on CRRT 5/31. Extubated 6/3. Re-intubated early am 6/11 for hypercarbic respiratory failure and underwent tracheostomy on 6/19   Assessment & Plan:   Active Problems:   Type 2 diabetes mellitus with other specified complication (HCC)   Dyslipidemia   OBESITY   Acute on chronic diastolic CHF (congestive heart failure) (HCC)   Acute kidney injury superimposed on chronic kidney disease (HCC)   Acute respiratory failure with hypoxia (HCC)   VAP (ventilator-associated pneumonia) (HCC)   Acute on chronic respiratory failure w/ hypoxia and hypercapneia     - prolonged mechanical ventilation     - now s/p trach (10/14/18)     - COVID negative     - secondary to decompensated HF     - PCCM still following trach  Proteus VAP/MSSA PNA     - abx course completed  PEA Arrest Acute on chronic diastolic HF PAH Afib/VT Chronic bradycardia     - ASA, atorvastatin     - unable to use ACEi/BB right d/t hypotension/bradycardia     - on heparin gtt for [facility]     - heparin gtt held d/t trach bleed  AKI on CKD4     - per nephrology     - midrodrine w/ iHD  DM2/HLD     - levemir 33 un BID,SSI     - monitor     - atorvastatin  Morbid obesity     - monitor caloric intake  Normocytic anemia     - bleeding around trach; hold heparin, monitor  Bleeding ON and this AM around trach. PCCM notified. Hold heparin. Respiratory status  stable.  DVT prophylaxis: heparin Code Status: FULL   Disposition Plan: TBD   Consultants:   PCCM  Nephrology   Subjective: Bleeding from trach ON  Objective: Vitals:   10/24/18 1325 10/24/18 1330 10/24/18 1337 10/24/18 1400  BP:  (!) 108/45 (!) 92/42 (!) 101/40  Pulse: (!) 55     Resp: 19     Temp:      TempSrc:      SpO2: 91%     Weight:  100.9 kg    Height:        Intake/Output Summary (Last 24 hours) at 10/24/2018 1423 Last data filed at 10/24/2018 1343 Gross per 24 hour  Intake 1681.25 ml  Output -  Net 1681.25 ml   Filed Weights   10/22/18 0700 10/22/18 1018 10/24/18 1330  Weight: 101.4 kg 100.9 kg 100.9 kg    Examination:  General: 68 y.o. female resting in bed in NAD Cardiovascular: brady, diminshed, +S1, S2, no m/g/r, equal pulses throughout, R [place] TC Respiratory: CTABL, no w/r/r, trach'd GI: BS+, NDNT, no masses noted, no organomegaly noted, morbidly obese MSK: BLE dressing C/D/I Skin: No rashes, bruises, ulcerations noted Neuro: awake, interactive; following some commands    Data Reviewed: I have personally reviewed following labs and imaging studies.  CBC: Recent Labs  Lab 10/19/18 0349 10/20/18 0417 10/21/18  0200 10/22/18 0316 10/24/18 0629 10/24/18 0952 10/24/18 1343  WBC 25.7* 21.3* 14.8* 12.4* 13.2*  --   --   NEUTROABS 21.0* 16.4*  --   --   --   --   --   HGB 10.3* 9.4* 8.1* 7.9* 8.8* 9.9* 7.7*  HCT 35.8* 33.3* 29.2* 28.3* 30.3* 29.0* 27.0*  MCV 94.0 94.1 95.7 95.6 95.0  --   --   PLT 184 180 145* 144* 144*  --   --    Basic Metabolic Panel: Recent Labs  Lab 10/18/18 0348  10/19/18 0349  10/20/18 0417 10/20/18 1733 10/21/18 0200 10/22/18 0316 10/23/18 0310 10/24/18 0337 10/24/18 0952  NA 133*   < > 134*   < > 134* 139 136 132* 132* 131* 130*  K 4.2   < > 4.8   < > 4.6 4.9 4.7 4.8 4.2 4.7 4.8  CL 101   < > 101   < > 101 107 103 100 95* 97*  --   CO2 26   < > 25   < > _0 --   GLUCOSE 197*   < > 232*    < > 195* 119* 140* 184* 180* 186*  --   BUN 28*   < > 27*   < > 23 30* 44* 72* 46* 72*  --   CREATININE 1.38*   < > 1.27*   < > 1.45* 1.83* 2.35* 3.58* 2.89* 3.89*  --   CALCIUM 7.8*   < > 8.0*   < > 7.8* 8.2* 8.2* 8.3* 8.2* 8.6*  --   MG 2.6*  --  2.7*  --  2.6*  --  2.7*  --   --  2.5*  --   PHOS 2.4*   < > 2.4*   < > 3.3 3.2 4.0 4.7* 3.5 4.2  --    < > = values in this interval not displayed.   GFR: Estimated Creatinine Clearance: 15 mL/min (A) (by C-G formula based on SCr of 3.89 mg/dL (H)). Liver Function Tests: Recent Labs  Lab 10/20/18 1733 10/21/18 0200 10/22/18 0316 10/23/18 0310 10/24/18 0337  ALBUMIN 1.7* 2.0* 1.9* 1.8* 1.8*   No results for input(s): LIPASE, AMYLASE in the last 168 hours. No results for input(s): AMMONIA in the last 168 hours. Coagulation Profile: No results for input(s): INR, PROTIME in the last 168 hours. Cardiac Enzymes: No results for input(s): CKTOTAL, CKMB, CKMBINDEX, TROPONINI in the last 168 hours. BNP (last 3 results) No results for input(s): PROBNP in the last 8760 hours. HbA1C: No results for input(s): HGBA1C in the last 72 hours. CBG: Recent Labs  Lab 10/23/18 1935 10/24/18 0028 10/24/18 0418 10/24/18 0751 10/24/18 1102  GLUCAP 175* 212* 182* 200* 243*   Lipid Profile: No results for input(s): CHOL, HDL, LDLCALC, TRIG, CHOLHDL, LDLDIRECT in the last 72 hours. Thyroid Function Tests: No results for input(s): TSH, T4TOTAL, FREET4, T3FREE, THYROIDAB in the last 72 hours. Anemia Panel: No results for input(s): VITAMINB12, FOLATE, FERRITIN, TIBC, IRON, RETICCTPCT in the last 72 hours. Sepsis Labs: Recent Labs  Lab 10/18/18 0348 10/19/18 0349  PROCALCITON 0.32 0.29    Recent Results (from the past 240 hour(s))  Culture, respiratory (non-expectorated)     Status: None   Collection Time: 10/17/18 11:50 AM   Specimen: Tracheal Aspirate; Respiratory  Result Value Ref Range Status   Specimen Description TRACHEAL ASPIRATE  Final    Special Requests NONE  Final  Gram Stain NO WBC SEEN RARE GRAM POSITIVE COCCI   Final   Culture   Final    Consistent with normal respiratory flora. Performed at [facility], [place] [address] [place], [place] [address]    Report Status 10/19/2018 FINAL  Final      Radiology Studies: Dg Chest Port 1 View  Result Date: 10/24/2018 CLINICAL DATA:  Tracheostomy. EXAM: PORTABLE CHEST 1 VIEW COMPARISON:  10/21/2018. FINDINGS: Tracheostomy tube, feeding tube, right IJ line, left PICC line in stable position. Cardiomegaly unchanged. Diffuse bilateral pulmonary infiltrates/edema again noted. Low lung volumes. Similar findings noted on prior exam. No prominent pleural effusion or pneumothorax. IMPRESSION: 1.  Lines and tubes stable position. 2.  Cardiomegaly unchanged. 3. Diffuse bilateral pulmonary infiltrates/edema again noted. Low lung volumes. Similar findings noted on prior exam. Electronically Signed   By: [place]   On: 10/24/2018 07:31     Scheduled Meds: . aspirin  81 mg Per Tube Daily  . atorvastatin  40 mg Oral q1800  . chlorhexidine gluconate (MEDLINE KIT)  15 mL Mouth Rinse BID  . Chlorhexidine Gluconate Cloth  6 each Topical Daily  . [START ON 10/26/2018] darbepoetin (ARANESP) injection - DIALYSIS  100 mcg Intravenous Q Wed-HD  . feeding supplement (PRO-STAT SUGAR FREE 64)  30 mL Per Tube TID  . heparin      . heparin      . insulin aspart  0-20 Units Subcutaneous Q4H  . insulin detemir  33 Units Subcutaneous BID  . mouth rinse  15 mL Mouth Rinse 10 times per day  . midodrine  15 mg Oral TID WC  . polyethylene glycol  17 g Oral Daily  . sodium chloride flush  10-40 mL Intracatheter Q12H   Continuous Infusions: . sodium chloride Stopped (10/20/18 1934)  . sodium chloride    . sodium chloride    . famotidine (PEPCID) IV 20 mg (10/23/18 1420)  . feeding supplement (VITAL 1.5 CAL) 1,000 mL (10/24/18 1343)     LOS: 32 days    Time spent: 35 minutes spent in  the coordination of care.    Jonnie Finner, DO [facility] Pager [phone]  If 7PM-7AM, please contact night-coverage [email or web address] Password [facility] 10/24/2018, 2:23 PM

## 2018-10-25 LAB — RENAL FUNCTION PANEL
Albumin: 1.8 g/dL — ABNORMAL LOW (ref 3.5–5.0)
Anion gap: 11 (ref 5–15)
BUN: 46 mg/dL — ABNORMAL HIGH (ref 8–23)
CO2: 24 mmol/L (ref 22–32)
Calcium: 8.3 mg/dL — ABNORMAL LOW (ref 8.9–10.3)
Chloride: 99 mmol/L (ref 98–111)
Creatinine, Ser: 2.99 mg/dL — ABNORMAL HIGH (ref 0.44–1.00)
GFR calc Af Amer: 18 mL/min — ABNORMAL LOW (ref >60)
GFR calc non Af Amer: 15 mL/min — ABNORMAL LOW (ref >60)
Glucose, Bld: 165 mg/dL — ABNORMAL HIGH (ref 70–99)
Phosphorus: 2.2 mg/dL — ABNORMAL LOW (ref 2.5–4.6)
Potassium: 4.5 mmol/L (ref 3.5–5.1)
Sodium: 134 mmol/L — ABNORMAL LOW (ref 135–145)

## 2018-10-25 LAB — CBC
HCT: 26.2 % — ABNORMAL LOW (ref 36.0–46.0)
Hemoglobin: 7.6 g/dL — ABNORMAL LOW (ref 12.0–15.0)
MCH: 27.4 pg (ref 26.0–34.0)
MCHC: 29 g/dL — ABNORMAL LOW (ref 30.0–36.0)
MCV: 94.6 fL (ref 80.0–100.0)
Platelets: 126 10*3/uL — ABNORMAL LOW (ref 150–400)
RBC: 2.77 MIL/uL — ABNORMAL LOW (ref 3.87–5.11)
RDW: 28 % — ABNORMAL HIGH (ref 11.5–15.5)
WBC: 11.6 10*3/uL — ABNORMAL HIGH (ref 4.0–10.5)
nRBC: 1.7 % — ABNORMAL HIGH (ref 0.0–0.2)

## 2018-10-25 LAB — GLUCOSE, CAPILLARY
Glucose-Capillary: 173 mg/dL — ABNORMAL HIGH (ref 70–99)
Glucose-Capillary: 181 mg/dL — ABNORMAL HIGH (ref 70–99)
Glucose-Capillary: 186 mg/dL — ABNORMAL HIGH (ref 70–99)
Glucose-Capillary: 229 mg/dL — ABNORMAL HIGH (ref 70–99)
Glucose-Capillary: 234 mg/dL — ABNORMAL HIGH (ref 70–99)
Glucose-Capillary: 245 mg/dL — ABNORMAL HIGH (ref 70–99)

## 2018-10-25 LAB — MRSA PCR SCREENING: MRSA by PCR: NEGATIVE

## 2018-10-25 MED ORDER — ATROPINE SULFATE 1 MG/10ML IJ SOSY
PREFILLED_SYRINGE | INTRAMUSCULAR | Status: AC
  Administered 2018-10-25: 22:00:00 0.5 mg
  Filled 2018-10-25: qty 10

## 2018-10-25 MED ORDER — CHLORHEXIDINE GLUCONATE CLOTH 2 % EX PADS
6.0000 | MEDICATED_PAD | Freq: Every day | CUTANEOUS | Status: DC
  Administered 2018-10-25 – 2018-10-28 (×4): 6 via TOPICAL

## 2018-10-25 MED ORDER — CHLORHEXIDINE GLUCONATE 0.12 % MT SOLN
OROMUCOSAL | Status: AC
  Administered 2018-10-25: 09:00:00 15 mL via OROMUCOSAL
  Filled 2018-10-25: qty 15

## 2018-10-25 MED ORDER — DOPAMINE-DEXTROSE 3.2-5 MG/ML-% IV SOLN
INTRAVENOUS | Status: AC
  Administered 2018-10-25: 2.5 ug/kg/min via INTRAVENOUS
  Filled 2018-10-25: qty 250

## 2018-10-25 MED ORDER — ALBUMIN HUMAN 25 % IV SOLN
12.5000 g | Freq: Once | INTRAVENOUS | Status: DC
  Filled 2018-10-25: qty 50

## 2018-10-25 MED ORDER — DOPAMINE-DEXTROSE 3.2-5 MG/ML-% IV SOLN
0.0000 ug/kg/min | INTRAVENOUS | Status: DC
  Administered 2018-10-25: 2.5 ug/kg/min via INTRAVENOUS
  Administered 2018-10-27: 4 ug/kg/min via INTRAVENOUS
  Administered 2018-10-28: 8 ug/kg/min via INTRAVENOUS
  Administered 2018-10-28: 7 ug/kg/min via INTRAVENOUS
  Administered 2018-10-29: 6 ug/kg/min via INTRAVENOUS
  Administered 2018-10-30 – 2018-10-31 (×2): 4 ug/kg/min via INTRAVENOUS
  Administered 2018-11-01: 6 ug/kg/min via INTRAVENOUS
  Administered 2018-11-07: 5 ug/kg/min via INTRAVENOUS
  Administered 2018-11-08: 7.5 ug/kg/min via INTRAVENOUS
  Administered 2018-11-09: 3 ug/kg/min via INTRAVENOUS
  Filled 2018-10-25 (×7): qty 250
  Filled 2018-10-25: qty 500
  Filled 2018-10-25 (×4): qty 250

## 2018-10-25 MED ORDER — INSULIN DETEMIR 100 UNIT/ML ~~LOC~~ SOLN
36.0000 [IU] | Freq: Two times a day (BID) | SUBCUTANEOUS | Status: DC
  Administered 2018-10-25: 36 [IU] via SUBCUTANEOUS
  Filled 2018-10-25 (×3): qty 0.36

## 2018-10-25 MED ORDER — VITAL 1.5 CAL PO LIQD
1000.0000 mL | ORAL | Status: AC
  Administered 2018-10-25 – 2018-10-31 (×5): 1000 mL
  Filled 2018-10-25 (×10): qty 1000

## 2018-10-25 NOTE — Procedures (Signed)
Arterial Catheter Insertion Procedure Note Kathleen [name] [id number] 04-Jun-1950  Procedure: Insertion of Arterial Catheter  Indications: Blood pressure monitoring and Frequent blood sampling  Procedure Details Consent: Unable to obtain consent because of altered level of consciousness. Time Out: Verified patient identification, verified procedure, site/side was marked, verified correct patient position, special equipment/implants available, medications/allergies/relevent history reviewed, required imaging and test results available.  Performed  Maximum sterile technique was used including antiseptics, cap, gloves, gown, hand hygiene, mask and sheet. Skin prep: Chlorhexidine; local anesthetic administered 20 gauge catheter was inserted into left radial artery using the Seldinger technique. ULTRASOUND GUIDANCE USED: NO Evaluation Blood flow good; BP tracing good. Complications: No apparent complications.   Virgilio Frees 10/25/2018

## 2018-10-25 NOTE — Progress Notes (Addendum)
[place] Progress Note Patient Name: Kathleen [name] DOB: 1950-08-13 MRN: [id number]   Date of Service  10/25/2018  HPI/Events of Note  Bradycardia into the 30's with drop of SBP to 77/26. She responded to 0.5 mg of Atropine. Heart rate currently 55 with MAP of 51  eICU Interventions  Begin Dopamine infusion at fixed rate of 2.5 mcg/kg/mim-titrate to heart rate and blood pressure goal, place pacing pads on Pt, cycle troponin, will target heart rate of 50 - 70 and MAP of 65 mmHg, arterial line insertion        Marti Acebo U Anthoney Sheppard 10/25/2018, 10:01 PM

## 2018-10-25 NOTE — Progress Notes (Signed)
Marland Kitchen  PROGRESS NOTE    Kathleen [name]  VFI:[id number] DOB: [birth year] DOA: 09/22/2018 PCP: Aldine Contes, MD   Brief Narrative:   68 y/o obese woman with chronic diastolic heart failure who failed outpatient diuretics management due to chronic kidney disease. Right heart cath on 5/29 showed markedly elevated biventricular filling pressures with normal cardiac output. She was on cardiology service, maintained on milrinone and Lasix. On the morning of 5/30 she went into PEA arrest, CPR performed for 8 minutes. Intubated and transferred to ICU. Started on CRRT 5/31. Extubated 6/3. Re-intubated early am 6/11 for hypercarbic respiratory failure and underwent tracheostomy on 6/19   Assessment & Plan:   Active Problems:   Type 2 diabetes mellitus with other specified complication (HCC)   Dyslipidemia   OBESITY   Acute on chronic diastolic CHF (congestive heart failure) (HCC)   Acute kidney injury superimposed on chronic kidney disease (HCC)   Acute respiratory failure with hypoxia (HCC)   VAP (ventilator-associated pneumonia) (HCC)   Acute on chronic respiratory failure w/ hypoxia and hypercapneia - prolonged mechanical ventilation - now s/p trach (10/14/18) - COVID negative - secondary to decompensated HF - PCCM still following trach  Proteus VAP/MSSA PNA - abx course completed  PEA Arrest Acute on chronic diastolic HF PAH Afib/VT Chronic bradycardia - ASA, atorvastatin - unable to use ACEi/BB right d/t hypotension/bradycardia - on heparin gtt for [facility]     - heparin gtt held d/t trach bleed  AKI on CKD4 - per nephrology - midrodrine w/ iHD  DM2/HLD - levemir 33 unBID, increase to 36; SSI - monitor - atorvastatin  Morbid obesity - monitor caloric intake  Normocytic anemia - bleeding around trach yesterday; hold heparin, monitor     - no bleeds ON     - Hgb is 7.6 this AM; repeat this evening,  hold on transfusion for now   DVT prophylaxis:heparin Code Status:FULL Disposition Plan:TBD   Consultants:   PCCM  Nephrology   Subjective: No acute events ON per nursing.  Objective: Vitals:   10/25/18 0800 10/25/18 0801 10/25/18 0827 10/25/18 0900  BP: (!) 93/48 (!) 93/48 (!) 93/48 (!) 98/47  Pulse: (!) 57 (!) 59 (!) 57 (!) 57  Resp: 20 14 (!) 33 (!) 23  Temp:      TempSrc:      SpO2: 96% 93% 97% 95%  Weight:      Height:        Intake/Output Summary (Last 24 hours) at 10/25/2018 1036 Last data filed at 10/25/2018 0900 Gross per 24 hour  Intake 1290 ml  Output 1000 ml  Net 290 ml   Filed Weights   10/24/18 1330 10/24/18 1637 10/25/18 0400  Weight: 100.9 kg 100 kg 105.5 kg    Examination:  General:68 y.o.femaleresting in bed in NAD Cardiovascular:brady, diminshed, +S1, S2, no m/g/r, equal pulses throughout, R [place] TC Respiratory: CTABL, no w/r/r,trach'd, dried blood around trach GI: BS+, NDNT, no masses noted, no organomegaly noted, morbidly obese MSK:BLE dressing C/D/I Neuro:awake, interactive; following commands, answers w/ yes/no nods   Data Reviewed: I have personally reviewed following labs and imaging studies.  CBC: Recent Labs  Lab 10/19/18 0349 10/20/18 0417 10/21/18 0200 10/22/18 0316 10/24/18 0629 10/24/18 0952 10/24/18 1343 10/25/18 0524  WBC 25.7* 21.3* 14.8* 12.4* 13.2*  --   --  11.6*  NEUTROABS 21.0* 16.4*  --   --   --   --   --   --   HGB 10.3* 9.4* 8.1*  7.9* 8.8* 9.9* 7.7* 7.6*  HCT 35.8* 33.3* 29.2* 28.3* 30.3* 29.0* 27.0* 26.2*  MCV 94.0 94.1 95.7 95.6 95.0  --   --  94.6  PLT 184 180 145* 144* 144*  --   --  220*   Basic Metabolic Panel: Recent Labs  Lab 10/19/18 0349  10/20/18 0417  10/21/18 0200 10/22/18 0316 10/23/18 0310 10/24/18 0337 10/24/18 0952 10/25/18 0524  NA 134*   < > 134*   < > 136 132* 132* 131* 130* 134*  K 4.8   < > 4.6   < > 4.7 4.8 4.2 4.7 4.8 4.5  CL 101   < > 101   < > 103 100 95*  97*  --  99  CO2 25   < > 25   < > 24 23 26 25   --  24  GLUCOSE 232*   < > 195*   < > 140* 184* 180* 186*  --  165*  BUN 27*   < > 23   < > 44* 72* 46* 72*  --  46*  CREATININE 1.27*   < > 1.45*   < > 2.35* 3.58* 2.89* 3.89*  --  2.99*  CALCIUM 8.0*   < > 7.8*   < > 8.2* 8.3* 8.2* 8.6*  --  8.3*  MG 2.7*  --  2.6*  --  2.7*  --   --  2.5*  --   --   PHOS 2.4*   < > 3.3   < > 4.0 4.7* 3.5 4.2  --  2.2*   < > = values in this interval not displayed.   GFR: Estimated Creatinine Clearance: 20 mL/min (A) (by C-G formula based on SCr of 2.99 mg/dL (H)). Liver Function Tests: Recent Labs  Lab 10/21/18 0200 10/22/18 0316 10/23/18 0310 10/24/18 0337 10/25/18 0524  ALBUMIN 2.0* 1.9* 1.8* 1.8* 1.8*   No results for input(s): LIPASE, AMYLASE in the last 168 hours. No results for input(s): AMMONIA in the last 168 hours. Coagulation Profile: No results for input(s): INR, PROTIME in the last 168 hours. Cardiac Enzymes: No results for input(s): CKTOTAL, CKMB, CKMBINDEX, TROPONINI in the last 168 hours. BNP (last 3 results) No results for input(s): PROBNP in the last 8760 hours. HbA1C: No results for input(s): HGBA1C in the last 72 hours. CBG: Recent Labs  Lab 10/24/18 1513 10/24/18 1943 10/24/18 2332 10/25/18 0429 10/25/18 0736  GLUCAP 131* 161* 166* 186* 181*   Lipid Profile: No results for input(s): CHOL, HDL, LDLCALC, TRIG, CHOLHDL, LDLDIRECT in the last 72 hours. Thyroid Function Tests: No results for input(s): TSH, T4TOTAL, FREET4, T3FREE, THYROIDAB in the last 72 hours. Anemia Panel: No results for input(s): VITAMINB12, FOLATE, FERRITIN, TIBC, IRON, RETICCTPCT in the last 72 hours. Sepsis Labs: Recent Labs  Lab 10/19/18 0349  PROCALCITON 0.29    Recent Results (from the past 240 hour(s))  Culture, respiratory (non-expectorated)     Status: None   Collection Time: 10/17/18 11:50 AM   Specimen: Tracheal Aspirate; Respiratory  Result Value Ref Range Status   Specimen  Description TRACHEAL ASPIRATE  Final   Special Requests NONE  Final   Gram Stain NO WBC SEEN RARE GRAM POSITIVE COCCI   Final   Culture   Final    Consistent with normal respiratory flora. Performed at [facility], [place] [address], [place], [place] [address]    Report Status 10/19/2018 FINAL  Final      Radiology Studies: Dg  Chest Port 1 View  Result Date: 10/24/2018 CLINICAL DATA:  Tracheostomy. EXAM: PORTABLE CHEST 1 VIEW COMPARISON:  10/21/2018. FINDINGS: Tracheostomy tube, feeding tube, right IJ line, left PICC line in stable position. Cardiomegaly unchanged. Diffuse bilateral pulmonary infiltrates/edema again noted. Low lung volumes. Similar findings noted on prior exam. No prominent pleural effusion or pneumothorax. IMPRESSION: 1.  Lines and tubes stable position. 2.  Cardiomegaly unchanged. 3. Diffuse bilateral pulmonary infiltrates/edema again noted. Low lung volumes. Similar findings noted on prior exam. Electronically Signed   By: [place]   On: 10/24/2018 07:31     Scheduled Meds: . aspirin  81 mg Per Tube Daily  . atorvastatin  40 mg Oral q1800  . chlorhexidine gluconate (MEDLINE KIT)  15 mL Mouth Rinse BID  . Chlorhexidine Gluconate Cloth  6 each Topical Daily  . Chlorhexidine Gluconate Cloth  6 each Topical Q0600  . [START ON 10/26/2018] darbepoetin (ARANESP) injection - DIALYSIS  100 mcg Intravenous Q Wed-HD  . feeding supplement (PRO-STAT SUGAR FREE 64)  30 mL Per Tube TID  . insulin aspart  0-20 Units Subcutaneous Q4H  . insulin detemir  33 Units Subcutaneous BID  . mouth rinse  15 mL Mouth Rinse 10 times per day  . midodrine  15 mg Oral TID WC  . polyethylene glycol  17 g Oral Daily  . sodium chloride flush  10-40 mL Intracatheter Q12H   Continuous Infusions: . sodium chloride Stopped (10/20/18 1934)  . sodium chloride    . sodium chloride    . famotidine (PEPCID) IV 20 mg (10/24/18 1442)  . feeding supplement (VITAL 1.5 CAL) 1,000 mL (10/25/18  0459)     LOS: 33 days    Time spent: 35 minutes spent in the coordination of care today.    Jonnie Finner, DO [facility] Pager [phone]  If 7PM-7AM, please contact night-coverage [email or web address] Password [email or web address] 10/25/2018, 10:36 AM

## 2018-10-25 NOTE — Progress Notes (Signed)
Physical Therapy Treatment Patient Details Name: Kathleen [name] MRN: [id number] DOB: 1950-06-11 Today's Date: 10/25/2018    History of Present Illness Kathleen [name] is a 68 y.o. female with history of permanent AF, hypertension, super morbid obesity, DM2, dyslipidemia, obesity hypoventilation syndrome (on home O2 with no OSA by prior PSG but showed nocturnal hypoxemia), CKD stage IV (baseline creatinine about 1.9), chronic diastolic CHF.  Admit for fluid overload. Also with pulmonary HTN.  PEA arrest 5/30, transfer to the ICU, extubater 6/3.  Intubated 10/06/18.  Trach on 10/14/18.      PT Comments    Pt nodded agreeable to participate and start trying to assist to sitting EOB.  Once EOB, pt became notably anxious sitting up at EOB, even with her feet touching the floor.  Emphasis on transition to EOB, sitting balance with feet on the floor, sit to stand and transfer to the recliner.   Follow Up Recommendations  CIR(If will can build up her stamina.)     Equipment Recommendations  None recommended by PT;Other (comment)(TBA)    Recommendations for Other Services       Precautions / Restrictions Precautions Precautions: Fall    Mobility  Bed Mobility Overal bed mobility: Needs Assistance Bed Mobility: Supine to Sit     Supine to sit: Max assist     General bed mobility comments: pt needed significant assist to scoot, Also needed truncal assist to come up via L elbow.  Transfers Overall transfer level: Needs assistance   Transfers: Sit to/from Stand;Stand Pivot Transfers Sit to Stand: Max assist;+2 physical assistance;+2 safety/equipment Stand pivot transfers: Max assist;+2 physical assistance       General transfer comment: pt was so anxious and extended, pt was relative easy to stand and was pivoted with face to face assist at max assist.  pt was unable to make pivotal steps and had to be assisted to pivot on her forefeet.  Ambulation/Gait                  Stairs             Wheelchair Mobility    Modified Rankin (Stroke Patients Only)       Balance   Sitting-balance support: Bilateral upper extremity supported;Single extremity supported Sitting balance-Leahy Scale: Poor Sitting balance - Comments: pt unable to relax at EOB today to work on balance and kept moving into extension and arching back, I suspect, in fear of falling off the EOB.     Standing balance-Leahy Scale: Poor Standing balance comment: reliant on external support                            Cognition Arousal/Alertness: Awake/alert Behavior During Therapy: WFL for tasks assessed/performed Overall Cognitive Status: Within Functional Limits for tasks assessed                                 General Comments: I suspect anxiety keeping pt from following directions at EOB sometimes.      Exercises Other Exercises Other Exercises: completed warm up hip/knee flexion/ext ROM prior to mobility.    General Comments        Pertinent Vitals/Pain Faces Pain Scale: No hurt Pain Location: unable to state    Home Living  Prior Function            PT Goals (current goals can now be found in the care plan section) Acute Rehab PT Goals Patient Stated Goal: to go home PT Goal Formulation: With patient Time For Goal Achievement: 11/02/18 Potential to Achieve Goals: Good Progress towards PT goals: Progressing toward goals(pt generally improving, has better and worse days)    Frequency    Min 3X/week      PT Plan Current plan remains appropriate    Co-evaluation              AM-PAC PT "6 Clicks" Mobility   Outcome Measure  Help needed turning from your back to your side while in a flat bed without using bedrails?: Total Help needed moving from lying on your back to sitting on the side of a flat bed without using bedrails?: Total Help needed moving to and from a bed to a chair  (including a wheelchair)?: Total Help needed standing up from a chair using your arms (e.g., wheelchair or bedside chair)?: Total Help needed to walk in hospital room?: Total Help needed climbing 3-5 steps with a railing? : Total 6 Click Score: 6    End of Session   Activity Tolerance: Other (comment)(limited by anxiety) Patient left: in chair;with call bell/phone within reach Nurse Communication: Mobility status;Need for lift equipment PT Visit Diagnosis: Other abnormalities of gait and mobility (R26.89);Muscle weakness (generalized) (M62.81)     Time: 7793-9688 PT Time Calculation (min) (ACUTE ONLY): 25 min  Charges:  $Therapeutic Activity: 23-37 mins                     10/25/2018  Donnella Sham, PT Acute Rehabilitation Services [phone]  (pager) [phone]  (office)   Tessie Fass Sameera Betton 10/25/2018, 2:00 PM

## 2018-10-25 NOTE — Progress Notes (Addendum)
Nutrition Follow-up  DOCUMENTATION CODES:   Morbid obesity  INTERVENTION:   -Monitor Phosphorus and supplement per MD  Increase tube feeding:  -Vital 1.5 @45ml  (1071ml)via Cortrak -30 ml ProstatTID  Provides: 1920kcal, 118grams protein, 875ml free water. Meets 100% ofkcal needs and 100% of protein needs.  NUTRITION DIAGNOSIS:   Inadequate oral intake related to inability to eat as evidenced by NPO status.  Ongoing  GOAL:   Provide needs based on ASPEN/SCCM guidelines  Addressed via TF  MONITOR:   Vent status, Labs, Weight trends, I & O's, TF tolerance  REASON FOR ASSESSMENT:   Consult Enteral/tube feeding initiation and management  ASSESSMENT:   68 year old with morbid obesity, acute on chronic diastolic heart failure, pulmonary hypertension transferred to the ICU after PEA arrest.  Suspect respiratory failure due to pulmonary edema   5/30 - s/p PEA arrest, intubated and transferred to ICU,CRRTinitiated 6/2- failed SBT, pulm edema 6/3 extubated, refused BiPAP 6/10- CRRT stopped  6/11- re-intubated, CRRT re-started 6/19- failed SBT, s/p trach  6/27- transition iHD  Pt discussed during ICU rounds and with RN.She's stable on TCT. Transitioned to midodrine for low BP. Next HD treatment planned for tomorrow. Tolerating Vital 1.5 @ 40 ml + 30 ml Prostat TID. RD to increase tube feeding rate to maximize kcal and protein. May consider changing to standard TF formula at next RD follow up.   Admission weight: 134.8 kg Current weight 105.5 kg (up from 100.2 kg on 6/23)  I/O: +8,475 ml since admit Last HD on 6/29: 1000 ml net UF   Medications: aranesp, SS novolog, Levemir, miralax Labs: Na 134 (L) Phosphorus 2.2 (L) CBG 131-243  Diet Order:   Diet Order            Diet NPO time specified  Diet effective midnight              EDUCATION NEEDS:   No education needs have been identified at this time  Skin:  Skin Assessment: Skin Integrity  Issues: Skin Integrity Issues:: Incisions Incisions: R neck/chest Other: MASD- bilateral breast, abdomen, back    Blister- R hip    Skin tear- breast  Last BM:  6/29  Height:   Ht Readings from Last 1 Encounters:  10/05/18 5' (1.524 m)    Weight:   Wt Readings from Last 1 Encounters:  10/25/18 105.5 kg    Ideal Body Weight:  45.5 kg  BMI:  Body mass index is 45.42 kg/m.  Estimated Nutritional Needs:   Kcal:  1800-2000 kcal  Protein:  100-120 grams  Fluid:  >/= 1.7 L/day   Mariana Single RD, LDN Clinical Nutrition Pager # - [phone]

## 2018-10-25 NOTE — Progress Notes (Signed)
[place] Progress Note Patient Name: Kathleen [name] DOB: Dec 12, 1950 MRN: [id number]   Date of Service  10/25/2018  HPI/Events of Note  Hypotension  eICU Interventions  Albumin 25 % 12.5 gm iv bolus x 1        [place] U Ogan 10/25/2018, 10:16 PM

## 2018-10-25 NOTE — Progress Notes (Signed)
SLP Cancellation Note  Patient Details Name: Kathleen [name] MRN: [id number] DOB: 09-Mar-1951   Cancelled treatment:       Reason Eval/Treat Not Completed: Medical issues which prohibited therapy - pt back on the vent. Will continue efforts.    [name] 10/25/2018, 12:57 PM  Pollyann Glen, M.A. [facility] Acute Environmental education officer [phone] Office [phone]

## 2018-10-25 NOTE — Progress Notes (Signed)
[place] for Heparin Indication: atrial fibrillation  Allergies  Allergen Reactions  . Doxycycline     REACTION: Wheals and pruritus    Patient Measurements: Height: 5' (152.4 cm) Weight: 232 lb 9.4 oz (105.5 kg) IBW/kg (Calculated) : 45.5  HEPARIN DW (KG): 71.8  Vital Signs: Temp: 98.1 F (36.7 C) (06/30 0734) Temp Source: Oral (06/30 0734) BP: 106/50 (06/30 0700) Pulse Rate: 52 (06/30 0700)  Labs: Recent Labs    10/23/18 0310 10/24/18 8022  10/24/18 0629 10/24/18 0952 10/24/18 1343 10/25/18 0524  HGB  --   --    < > 8.8* 9.9* 7.7* 7.6*  HCT  --   --    < > 30.3* 29.0* 27.0* 26.2*  PLT  --   --   --  144*  --   --  126*  HEPARINUNFRC 0.24*  --   --  0.43  --   --   --   CREATININE 2.89* 3.89*  --   --   --   --  2.99*   < > = values in this interval not displayed.    Estimated Creatinine Clearance: 20 mL/min (A) (by C-G formula based on SCr of 2.99 mg/dL (H)).  Assessment: 68 y.o. female with h/o Afib, (PTA Eliquis on hold) s/p trach placement.  Pharmacy has been consulted to dose heparin.   Heparin drip turned off yesterday due to bleeding at trach site.  Hgb 9.9 > 7.7 > 7.6.  Pltc okay  Goal of Therapy:  Heparin level 0.3-0.7 units/ml  Monitor platelets by anticoagulation protocol: Yes  Plan:  Continuing to hold heparin for now. Will f/u plans to resume anticoagulation.  Marguerite Olea, [facility] Clinical Pharmacist Phone [phone]  10/25/2018 7:46 AM

## 2018-10-25 NOTE — Progress Notes (Signed)
RT note: RT was walking past patient room and noted that sats were reading in the 70s, with a good waveform.  Upon entering patient room, noted that trach collar had slid up patient's head and wasn't on trach.  Placed collar back on trach and increased FIO2 however sats still dropped to the 60s.  Placed patient back on ventilator, gave 100% FIO2 breath, and suctioned patient.  Patient's sats improved back to 99%.  Informed RN.  Will try again later if able.  Will continue to monitor.

## 2018-10-25 NOTE — Progress Notes (Signed)
[facility] NEPHROLOGY PROGRESS NOTE  Assessment/ Plan: Pt is a 68 y.o. yo female with CHF failed outpatient diuretics therapy underwent RHC on 5/29 with markedly elevated filling pressure, subsequent PEA arrest on 5/30, intubated and ROSC achieved.  She was a started on CRRT for AKI.  #Acute kidney injury likely ATN secondary to shock, anuric: Transitioned to [facility] on 6/27. Using right subclavian TDC.  She is on midodrine for hypotension. -Last dialysis yesterday with 1 kg UF.  Plan for next dialysis tomorrow.  #CKD stage IV with baseline creatinine around 1.7-1.9 due to diabetes and hypertension.    #Chronic respiratory failure: Status post tracheostomy.  Off ventilator to trach collar today, per pulmonary.  #Chronic diastolic heart failure: Volume optimization during dialysis.  #Anemia due to chronic disease and acute blood loss: Continue ESA, had IV iron 6/28. Monitor cbc.  #Bradycardia, PEA cardiac arrest  Subjective: Seen and examined at bedside.  Alert awake.  On trach collar.  No new event.  Denied nausea vomiting.  No chest pain.  Objective Vital signs in last 24 hours: Vitals:   10/25/18 0600 10/25/18 0700 10/25/18 0734 10/25/18 0801  BP: (!) 106/51 (!) 106/50  (!) 93/48  Pulse: 64 (!) 52  (!) 59  Resp: (!) 25 (!) 21  14  Temp:   98.1 F (36.7 C)   TempSrc:   Oral   SpO2: 95% 94%  93%  Weight:      Height:       Weight change:   Intake/Output Summary (Last 24 hours) at 10/25/2018 0811 Last data filed at 10/25/2018 0700 Gross per 24 hour  Intake 1360 ml  Output 1000 ml  Net 360 ml       Labs: Basic Metabolic Panel: Recent Labs  Lab 10/23/18 0310 10/24/18 0337 10/24/18 0952 10/25/18 0524  NA 132* 131* 130* 134*  K 4.2 4.7 4.8 4.5  CL 95* 97*  --  99  CO2 26 25  --  24  GLUCOSE 180* 186*  --  165*  BUN 46* 72*  --  46*  CREATININE 2.89* 3.89*  --  2.99*  CALCIUM 8.2* 8.6*  --  8.3*  PHOS 3.5 4.2  --  2.2*   Liver Function  Tests: Recent Labs  Lab 10/23/18 0310 10/24/18 0337 10/25/18 0524  ALBUMIN 1.8* 1.8* 1.8*   No results for input(s): LIPASE, AMYLASE in the last 168 hours. No results for input(s): AMMONIA in the last 168 hours. CBC: Recent Labs  Lab 10/19/18 0349 10/20/18 0417 10/21/18 0200 10/22/18 0316 10/24/18 0629 10/24/18 0952 10/24/18 1343 10/25/18 0524  WBC 25.7* 21.3* 14.8* 12.4* 13.2*  --   --  11.6*  NEUTROABS 21.0* 16.4*  --   --   --   --   --   --   HGB 10.3* 9.4* 8.1* 7.9* 8.8* 9.9* 7.7* 7.6*  HCT 35.8* 33.3* 29.2* 28.3* 30.3* 29.0* 27.0* 26.2*  MCV 94.0 94.1 95.7 95.6 95.0  --   --  94.6  PLT 184 180 145* 144* 144*  --   --  126*   Cardiac Enzymes: No results for input(s): CKTOTAL, CKMB, CKMBINDEX, TROPONINI in the last 168 hours. CBG: Recent Labs  Lab 10/24/18 1513 10/24/18 1943 10/24/18 2332 10/25/18 0429 10/25/18 0736  GLUCAP 131* 161* 166* 186* 181*    Iron Studies: No results for input(s): IRON, TIBC, TRANSFERRIN, FERRITIN in the last 72 hours. Studies/Results: Dg Chest Port 1 View  Result Date: 10/24/2018 CLINICAL DATA:  Tracheostomy. EXAM: PORTABLE CHEST 1 VIEW COMPARISON:  10/21/2018. FINDINGS: Tracheostomy tube, feeding tube, right IJ line, left PICC line in stable position. Cardiomegaly unchanged. Diffuse bilateral pulmonary infiltrates/edema again noted. Low lung volumes. Similar findings noted on prior exam. No prominent pleural effusion or pneumothorax. IMPRESSION: 1.  Lines and tubes stable position. 2.  Cardiomegaly unchanged. 3. Diffuse bilateral pulmonary infiltrates/edema again noted. Low lung volumes. Similar findings noted on prior exam. Electronically Signed   By: [place]   On: 10/24/2018 07:31    Medications: Infusions: . sodium chloride Stopped (10/20/18 1934)  . sodium chloride    . sodium chloride    . famotidine (PEPCID) IV 20 mg (10/24/18 1442)  . feeding supplement (VITAL 1.5 CAL) 1,000 mL (10/25/18 0459)    Scheduled  Medications: . aspirin  81 mg Per Tube Daily  . atorvastatin  40 mg Oral q1800  . chlorhexidine gluconate (MEDLINE KIT)  15 mL Mouth Rinse BID  . Chlorhexidine Gluconate Cloth  6 each Topical Daily  . [START ON 10/26/2018] darbepoetin (ARANESP) injection - DIALYSIS  100 mcg Intravenous Q Wed-HD  . feeding supplement (PRO-STAT SUGAR FREE 64)  30 mL Per Tube TID  . insulin aspart  0-20 Units Subcutaneous Q4H  . insulin detemir  33 Units Subcutaneous BID  . mouth rinse  15 mL Mouth Rinse 10 times per day  . midodrine  15 mg Oral TID WC  . polyethylene glycol  17 g Oral Daily  . sodium chloride flush  10-40 mL Intracatheter Q12H    have reviewed scheduled and prn medications.  Physical Exam: General:NAD, comfortable, lying flat Neck: Tracheostomy site looks clean with no bleeding Heart:RRR, s1s2 nl, no rubs Lungs: Coarse breath sound bilateral, no wheezing Abdomen:soft, Non-tender, non-distended Extremities:No edema Dialysis Access: Right subclavian TDC.  Durant Scibilia Prasad Kadedra Vanaken 10/25/2018,8:11 AM  LOS: 33 days  Pager: [phone]

## 2018-10-25 NOTE — Progress Notes (Signed)
RT note: patient taken off of ventilator and placed on 60% trach collar.  Currently tolerating well with sats of 93%.  Will continue to monitor.

## 2018-10-25 NOTE — Progress Notes (Signed)
NAME:  Kathleen [name], MRN:  [id number], DOB:  1950/12/09, LOS: 67 ADMISSION DATE:  09/22/2018, CONSULTATION DATE: 09/24/2018 REFERRING MD: Kathleen Furbish MD, CHIEF COMPLAINT: Cardiac arrest  Brief History   68 y/o obese woman with chronic diastolic heart failure who failed outpatient diuretics management due to chronic kidney disease.  Right heart cath on 5/29 showed markedly elevated biventricular filling pressures with normal cardiac output.  She was on cardiology service, maintained on milrinone and Lasix.  On the morning of 5/30 she went into PEA arrest, CPR performed for 8 minutes.  Intubated and transferred to ICU.  Started on CRRT 5/31.  Extubated 6/3.  Re-intubated early am 6/11 for hypercarbic respiratory failure and underwent tracheostomy on 6/19  Past Medical History   has a past medical history of Atrial flutter (Kathleen [name]), Bradycardia, Chronic diastolic (congestive) heart failure (Kathleen [name]) (01/10/2015), CKD (chronic kidney disease), stage IV (Kathleen [name]), Degenerative joint disease of hand, Diabetes mellitus, Dyslipidemia, Fecal occult blood test positive, GERD (gastroesophageal reflux disease), Headache, Hypertension, Inadequate material resources, Irritable bowel syndrome, Morbid obesity (Kathleen [name]), Obesity hypoventilation syndrome (Kathleen [name]), Post-menopausal bleeding, and Shortness of breath dyspnea.  Significant Hospital Events   5/28 Admit 5/29 RHC 5/30 PEA arrest, intubated/ CRRT 5/31 Milrinone stopped due to ectopy; brady episode- amio stopped 6/02 Attempt at SBT, chest x-ray with worsening pulmonary edema, CVVHD for volume removal 6/03 Extubated > remain on CRRT.  Refused nocturnal BiPAP 6/04 Refused nocturnal BiPAP 6/10 off CRRT 6/11 Intubated early am with hypercarbia, concern for RUL PNA; abx broadened; restarted on CRRT 6/16  No events overnight, weaning on high PS 6/17  failed vent wean, remains on levophed, midodrine and CRRT 6/18  Weaned for 5 hours on PSV 8/5 before requiring full support;  ongoing CRRT, remains on levophed 14 mcg 6/19 - TRACH + Dr Kathleen [name] 6/20 -. On IV heparin gt, levophed gtt with midodrine. Off fent gtt. Pressopr needs down 6/26 - TCT tolerated for the past 24 hours  6/27 - Stable on TCT now >48hrs, tolerated first session of iHD   Consults:  Cardiology PCCM  Nephrology   Procedures:  Lt PICC 5/29 >> OETT 5/30 >> 6/3; 6/11 > 6/19 - TRACH (Dr Kathleen [name]) >> R IJ HD cath 5/30 >> Aline left ulnar 5/30 >> 6/2  Significant Diagnostic Tests:  TTE 5/28 >>The left ventricle has normal systolic function with an ejection fraction of 60-65%. . The right ventricle has normal systolic function. The cavity was mildly enlarged.   Right heart cath 5/29 RA = 24 RV = 94/26 PA = 95/36 (53) PCW = 30 (v = 50) Fick cardiac output/index = 6.0/2.7 PVR =3.9 WU FA sat = 91% PA sat = 58%, 58% SVC 60%  CXR 6/15> Bilateral ASD LLL > R. ETT, GT, LIJ and RIJ CVCs stable position.   Micro Data:  SARS coronavirus 2 cepheid 5/28 >> neg MRSA PCR 5/28 >> neg Trach aspirate 6/2 >> MSSA BCx2 6/2 >> negative ...................... Tracheal aspirate 6/11 >> few GNR >> few proteus and few candida tropicalis BCx2 6/11 >> negative  Antimicrobials:  Vancomycin 6/3 > 6/4 Cefazolin 6/4 >> 6/10 ...........................Marland Kitchen Vanco 6/11 >> 6/17 Cefepime 6/11 >>6/18  Interim history/subjective:  Did not tol attempts at ATC this am r/t hypoxia Low grade fever  Trach site bleeding improved   Objective   Blood pressure (!) 98/47, pulse (!) 57, temperature 98.1 F (36.7 C), temperature source Oral, resp. rate (!) 23, height 5' (1.524 m), weight 105.5 kg, SpO2 95 %.  Vent Mode: PRVC FiO2 (%):  [50 %-98 %] 50 % Set Rate:  [20 bmp] 20 bmp Vt Set:  [360 mL] 360 mL PEEP:  [5 cmH20] 5 cmH20 Plateau Pressure:  [19 cmH20-23 cmH20] 19 cmH20   Intake/Output Summary (Last 24 hours) at 10/25/2018 1117 Last data filed at 10/25/2018 0900 Gross per 24 hour  Intake 1250 ml  Output  1000 ml  Net 250 ml   Filed Weights   10/24/18 1330 10/24/18 1637 10/25/18 0400  Weight: 100.9 kg 100 kg 105.5 kg    General appearance: awake, alert, comfortable on vent  Eyes: Anicteric HENT: Moist oral mucosa Neck: Trachea is midline, some dried tan secretions, no bleeding noted  Lungs: resps even non labored on vent, few scattered rhonchi  CV: S1-S2 appreciated Abdomen: Soft, non-tender; non-distended, BS present  Extremities: Warm, no edema Neuro: Arouses easily     Resolved Hospital Problem list    Assessment & Plan:   Acute on chronic respiratory failure following cardiac arrest -Currently on mechanical ventilation -She does tolerate trach collar -Arterial blood gases noted revealing hypercapnia-unchanged from previous -continue attempts at atc  -volume removal with HD as able   Encephalopathy - improved  -Minimize sedation -On Klonopin, Seroquel  Post PEA arrest -Hemodynamics have been stable -  Hypotension -On midodrine  Pneumonia s/p abx  -MSSA pneumonia 6/2, Proteus ventilator associated pneumonia 6/11 -Respiratory status is stable  History of cardiorenal syndrome -On intermittent hemodialysis -She is anuric  Morbid obesity Diabetes -Continue insulin-on SSI  Bleeding from around trach site Extensive bruising under her breast -Heparin on hold at present -Follow H&H this afternoon -if no further bleeding consider resume heparin 7/1  Low grade fever -  -d/c PICC (not functioning)  -monitor fever curve, wbc  -check pct  -consider empiric rx ?aspiration given extensive trach site bleeding 6/29  Best practice:  Diet: Continue tube feed Pain/Anxiety/Delirium protocol (if indicated):  In place  VAP protocol (if indicated): In place  DVT prophylaxis: Heparin gtt GI prophylaxis: Pepcid Glucose control: CXR Mobility: Bed Code Status: Full Disposition: ICU     Kathleen Madrid, NP 10/25/2018  11:17 AM Pager: [phone] or [phone]

## 2018-10-26 LAB — TROPONIN I (HIGH SENSITIVITY)
Troponin I (High Sensitivity): 29 ng/L — ABNORMAL HIGH (ref <18)
Troponin I (High Sensitivity): 31 ng/L — ABNORMAL HIGH (ref <18)

## 2018-10-26 LAB — GLUCOSE, CAPILLARY
Glucose-Capillary: 148 mg/dL — ABNORMAL HIGH (ref 70–99)
Glucose-Capillary: 190 mg/dL — ABNORMAL HIGH (ref 70–99)
Glucose-Capillary: 205 mg/dL — ABNORMAL HIGH (ref 70–99)
Glucose-Capillary: 211 mg/dL — ABNORMAL HIGH (ref 70–99)
Glucose-Capillary: 244 mg/dL — ABNORMAL HIGH (ref 70–99)
Glucose-Capillary: 246 mg/dL — ABNORMAL HIGH (ref 70–99)

## 2018-10-26 LAB — MAGNESIUM: Magnesium: 2.5 mg/dL — ABNORMAL HIGH (ref 1.7–2.4)

## 2018-10-26 LAB — CBC
HCT: 26.5 % — ABNORMAL LOW (ref 36.0–46.0)
Hemoglobin: 7.7 g/dL — ABNORMAL LOW (ref 12.0–15.0)
MCH: 27.6 pg (ref 26.0–34.0)
MCHC: 29.1 g/dL — ABNORMAL LOW (ref 30.0–36.0)
MCV: 95 fL (ref 80.0–100.0)
Platelets: 117 10*3/uL — ABNORMAL LOW (ref 150–400)
RBC: 2.79 MIL/uL — ABNORMAL LOW (ref 3.87–5.11)
RDW: 28.3 % — ABNORMAL HIGH (ref 11.5–15.5)
WBC: 13.7 10*3/uL — ABNORMAL HIGH (ref 4.0–10.5)
nRBC: 0.7 % — ABNORMAL HIGH (ref 0.0–0.2)

## 2018-10-26 LAB — RENAL FUNCTION PANEL
Albumin: 1.7 g/dL — ABNORMAL LOW (ref 3.5–5.0)
Anion gap: 11 (ref 5–15)
BUN: 69 mg/dL — ABNORMAL HIGH (ref 8–23)
CO2: 24 mmol/L (ref 22–32)
Calcium: 8.4 mg/dL — ABNORMAL LOW (ref 8.9–10.3)
Chloride: 98 mmol/L (ref 98–111)
Creatinine, Ser: 4.13 mg/dL — ABNORMAL HIGH (ref 0.44–1.00)
GFR calc Af Amer: 12 mL/min — ABNORMAL LOW (ref >60)
GFR calc non Af Amer: 10 mL/min — ABNORMAL LOW (ref >60)
Glucose, Bld: 262 mg/dL — ABNORMAL HIGH (ref 70–99)
Phosphorus: 2.6 mg/dL (ref 2.5–4.6)
Potassium: 4.5 mmol/L (ref 3.5–5.1)
Sodium: 133 mmol/L — ABNORMAL LOW (ref 135–145)

## 2018-10-26 MED ORDER — HEPARIN SODIUM (PORCINE) 1000 UNIT/ML DIALYSIS
1000.0000 [IU] | INTRAMUSCULAR | Status: DC | PRN
  Administered 2018-11-09: 1000 [IU] via INTRAVENOUS_CENTRAL

## 2018-10-26 MED ORDER — ALBUMIN HUMAN 25 % IV SOLN
50.0000 g | Freq: Once | INTRAVENOUS | Status: AC
  Administered 2018-10-26: 50 g via INTRAVENOUS
  Filled 2018-10-26: qty 150

## 2018-10-26 MED ORDER — INSULIN DETEMIR 100 UNIT/ML ~~LOC~~ SOLN
40.0000 [IU] | Freq: Two times a day (BID) | SUBCUTANEOUS | Status: DC
  Administered 2018-10-26 – 2018-11-07 (×24): 40 [IU] via SUBCUTANEOUS
  Filled 2018-10-26 (×30): qty 0.4

## 2018-10-26 MED ORDER — SODIUM CHLORIDE 0.9 % IV SOLN: 100.0000 mL | INTRAVENOUS | Status: DC | PRN

## 2018-10-26 MED ORDER — ALTEPLASE 2 MG IJ SOLR: 2.0000 mg | Freq: Once | INTRAMUSCULAR | Status: DC | PRN

## 2018-10-26 MED ORDER — PENTAFLUOROPROP-TETRAFLUOROETH EX AERO: 1.0000 "application " | INHALATION_SPRAY | CUTANEOUS | Status: DC | PRN

## 2018-10-26 MED ORDER — LIDOCAINE-PRILOCAINE 2.5-2.5 % EX CREA
1.0000 "application " | TOPICAL_CREAM | CUTANEOUS | Status: DC | PRN
  Filled 2018-10-26: qty 5

## 2018-10-26 MED ORDER — HEPARIN SODIUM (PORCINE) 1000 UNIT/ML DIALYSIS: 20.0000 [IU]/kg | INTRAMUSCULAR | Status: DC | PRN

## 2018-10-26 MED ORDER — LIDOCAINE HCL (PF) 1 % IJ SOLN: 5.0000 mL | INTRAMUSCULAR | Status: DC | PRN

## 2018-10-26 NOTE — Progress Notes (Signed)
Kathleen [name]  Assessment/ Plan: Pt is a 68 y.o. yo female with CHF failed outpatient diuretics therapy underwent RHC on 5/29 with markedly elevated filling pressure, subsequent PEA arrest on 5/30, intubated and ROSC achieved.  She was a started on CRRT for AKI.  #Acute Kathleen injury likely ATN secondary to shock, anuric: Transitioned to [facility] on 6/27. Using right subclavian TDC.  She is on midodrine for hypotension. -Now on dopamine for bradycardia.  Heart rate upper 40s to 50.  Blood pressure is better.  Plan for intermittent HD today, try ultrafiltration.  Albumin before dialysis.  If she does not tolerate then she will need CRRT. Dispo: If she remains on vent or trach then she may need LTAC  #CKD stage IV with baseline creatinine around 1.7-1.9 due to diabetes and hypertension.    #Chronic respiratory failure: Status post tracheostomy.  Back on vent, per pulmonary.  #Chronic diastolic heart failure: Volume optimization during dialysis.  #Anemia due to chronic disease and acute blood loss: Continue ESA, had IV iron 6/28. Monitor cbc.  #Bradycardia, PEA cardiac arrest: On dopamine now.  Per critical care team.  Discussed with the primary team.  Subjective: Seen and examined at bedside.  Overnight event noted.  Patient was bradycardic and hypotensive.  Treated with albumin and started on dopamine.  Now blood pressure is much better and heart rate in upper 40s to early 50s.  Currently on pain.  Objective Vital signs in last 24 hours: Vitals:   10/26/18 0700 10/26/18 0715 10/26/18 0825 10/26/18 0827  BP: (!) 110/46   (!) 121/42  Pulse: (!) 52 (!) 49  (!) 47  Resp: (!) 22 (!) 31  (!) 28  Temp:   98.7 F (37.1 C)   TempSrc:   Oral   SpO2: 100% 98%  98%  Weight:      Height:       Weight change: 4.2 kg  Intake/Output Summary (Last 24 hours) at 10/26/2018 0854 Last data filed at 10/26/2018 0800 Gross per 24 hour  Intake 1196.71 ml  Output -  Net  1196.71 ml       Labs: Basic Metabolic Panel: Recent Labs  Lab 10/24/18 0337 10/24/18 0952 10/25/18 0524 10/26/18 0402  NA 131* 130* 134* 133*  K 4.7 4.8 4.5 4.5  CL 97*  --  99 98  CO2 25  --  24 24  GLUCOSE 186*  --  165* 262*  BUN 72*  --  46* 69*  CREATININE 3.89*  --  2.99* 4.13*  CALCIUM 8.6*  --  8.3* 8.4*  PHOS 4.2  --  2.2* 2.6   Liver Function Tests: Recent Labs  Lab 10/24/18 0337 10/25/18 0524 10/26/18 0402  ALBUMIN 1.8* 1.8* 1.7*   No results for input(s): LIPASE, AMYLASE in the last 168 hours. No results for input(s): AMMONIA in the last 168 hours. CBC: Recent Labs  Lab 10/20/18 0417 10/21/18 0200 10/22/18 0316 10/24/18 0629  10/24/18 1343 10/25/18 0524 10/26/18 0402  WBC 21.3* 14.8* 12.4* 13.2*  --   --  11.6* 13.7*  NEUTROABS 16.4*  --   --   --   --   --   --   --   HGB 9.4* 8.1* 7.9* 8.8*   < > 7.7* 7.6* 7.7*  HCT 33.3* 29.2* 28.3* 30.3*   < > 27.0* 26.2* 26.5*  MCV 94.1 95.7 95.6 95.0  --   --  94.6 95.0  PLT 180 145* 144* 144*  --   --  126* 117*   < > = values in this interval not displayed.   Cardiac Enzymes: No results for input(s): CKTOTAL, CKMB, CKMBINDEX, TROPONINI in the last 168 hours. CBG: Recent Labs  Lab 10/25/18 1536 10/25/18 1944 10/25/18 2354 10/26/18 0417 10/26/18 0739  GLUCAP 229* 245* 234* 244* 246*    Iron Studies: No results for input(s): IRON, TIBC, TRANSFERRIN, FERRITIN in the last 72 hours. Studies/Results: No results found.  Medications: Infusions: . sodium chloride Stopped (10/20/18 1934)  . sodium chloride    . sodium chloride    . DOPamine 3.5 mcg/kg/min (10/26/18 0853)  . famotidine (PEPCID) IV 20 mg (10/25/18 1426)  . feeding supplement (VITAL 1.5 CAL) 1,000 mL (10/25/18 1429)    Scheduled Medications: . aspirin  81 mg Per Tube Daily  . atorvastatin  40 mg Oral q1800  . chlorhexidine gluconate (MEDLINE KIT)  15 mL Mouth Rinse BID  . Chlorhexidine Gluconate Cloth  6 each Topical Daily  .  Chlorhexidine Gluconate Cloth  6 each Topical Q0600  . darbepoetin (ARANESP) injection - DIALYSIS  100 mcg Intravenous Q Wed-HD  . feeding supplement (PRO-STAT SUGAR FREE 64)  30 mL Per Tube TID  . insulin aspart  0-20 Units Subcutaneous Q4H  . insulin detemir  40 Units Subcutaneous BID  . mouth rinse  15 mL Mouth Rinse 10 times per day  . midodrine  15 mg Oral TID WC  . polyethylene glycol  17 g Oral Daily  . sodium chloride flush  10-40 mL Intracatheter Q12H    have reviewed scheduled and prn medications.  Physical Exam: General: On vent with trach collar, alert awake Neck: Tracheostomy site looks clean with no bleeding Heart:RRR, s1s2 nl, no rubs Lungs: Coarse mechanical sound, no wheezing Abdomen:soft, Non-tender, non-distended Extremities:No edema Dialysis Access: Right subclavian TDC.  Dron Prasad Bhandari 10/26/2018,8:54 AM  LOS: 34 days  Pager: [phone]

## 2018-10-26 NOTE — Progress Notes (Signed)
NAME:  Kathleen [name], MRN:  [id number], DOB:  03/30/1951, LOS: 36 ADMISSION DATE:  09/22/2018, CONSULTATION DATE: 09/24/2018 REFERRING MD: Candee Furbish MD, CHIEF COMPLAINT: Cardiac arrest  Brief History   68 y/o obese woman with chronic diastolic heart failure who failed outpatient diuretics management due to chronic kidney disease.  Right heart cath on 5/29 showed markedly elevated biventricular filling pressures with normal cardiac output.  She was on cardiology service, maintained on milrinone and Lasix.  On the morning of 5/30 she went into PEA arrest, CPR performed for 8 minutes.  Intubated and transferred to ICU.  Started on CRRT 5/31.  Extubated 6/3.  Re-intubated early am 6/11 for hypercarbic respiratory failure and underwent tracheostomy on 6/19  Past Medical History   has a past medical history of Atrial flutter ([place]), Bradycardia, Chronic diastolic (congestive) heart failure ([place]) (01/10/2015), CKD (chronic kidney disease), stage IV ([place]), Degenerative joint disease of hand, Diabetes mellitus, Dyslipidemia, Fecal occult blood test positive, GERD (gastroesophageal reflux disease), Headache, Hypertension, Inadequate material resources, Irritable bowel syndrome, Morbid obesity ([place]), Obesity hypoventilation syndrome ([place]), Post-menopausal bleeding, and Shortness of breath dyspnea.  Significant Hospital Events   5/28 Admit 5/29 RHC 5/30 PEA arrest, intubated/ CRRT 5/31 Milrinone stopped due to ectopy; brady episode- amio stopped 6/02 Attempt at SBT, chest x-ray with worsening pulmonary edema, CVVHD for volume removal 6/03 Extubated > remain on CRRT.  Refused nocturnal BiPAP 6/04 Refused nocturnal BiPAP 6/10 off CRRT 6/11 Intubated early am with hypercarbia, concern for RUL PNA; abx broadened; restarted on CRRT 6/16  No events overnight, weaning on high PS 6/17  failed vent wean, remains on levophed, midodrine and CRRT 6/18  Weaned for 5 hours on PSV 8/5 before requiring full support;  ongoing CRRT, remains on levophed 14 mcg 6/19 - TRACH + Dr Erskine Emery 6/20 -. On IV heparin gt, levophed gtt with midodrine. Off fent gtt. Pressopr needs down 6/26 - TCT tolerated for the past 24 hours  6/27 - Stable on TCT now >48hrs, tolerated first session of iHD  6/30 hypotension / bradycardia overnight, started on dopamine  Consults:  Cardiology PCCM  Nephrology   Procedures:  Lt PICC 5/29 >> 6/30 OETT 5/30 >> 6/3; 6/11 > 6/19 - TRACH (Dr Erskine Emery) >> R IJ HD cath 5/30 >> Aline left ulnar 5/30 >> 6/2  Significant Diagnostic Tests:  TTE 5/28 >>The left ventricle has normal systolic function with an ejection fraction of 60-65%. . The right ventricle has normal systolic function. The cavity was mildly enlarged.   Right heart cath 5/29 RA = 24 RV = 94/26 PA = 95/36 (53) PCW = 30 (v = 50) Fick cardiac output/index = 6.0/2.7 PVR =3.9 WU FA sat = 91% PA sat = 58%, 58% SVC 60%  CXR 6/15> Bilateral ASD LLL > R. ETT, GT, LIJ and RIJ CVCs stable position.   Micro Data:  SARS coronavirus 2 cepheid 5/28 >> neg MRSA PCR 5/28 >> neg Trach aspirate 6/2 >> MSSA BCx2 6/2 >> negative ...................... Tracheal aspirate 6/11 >> few GNR >> few proteus and few candida tropicalis BCx2 6/11 >> negative  Antimicrobials:  Vancomycin 6/3 > 6/4 Cefazolin 6/4 >> 6/10 ...........................Marland Kitchen Vanco 6/11 >> 6/17 Cefepime 6/11 >>6/18  Interim history/subjective:  On 3.9mcg/kg/min dopamine. BP improved, HR still in 40s (baseline bradycardia)  Not weaned today yet as on 50% FiO2.  Net +10.5L  Objective   Blood pressure (!) 110/46, pulse (!) 49, temperature 98.7 F (37.1 C), temperature source Oral,  resp. rate (!) 31, height 5' (1.524 m), weight 105.1 kg, SpO2 98 %.    Vent Mode: PRVC FiO2 (%):  [50 %] 50 % Set Rate:  [20 bmp] 20 bmp Vt Set:  [360 mL] 360 mL PEEP:  [5 cmH20] 5 cmH20 Plateau Pressure:  [18 cmH20-24 cmH20] 18 cmH20   Intake/Output Summary (Last 24 hours)  at 10/26/2018 8638 Last data filed at 10/26/2018 0800 Gross per 24 hour  Intake 1196.71 ml  Output -  Net 1196.71 ml   Filed Weights   10/24/18 1637 10/25/18 0400 10/26/18 0400  Weight: 100 kg 105.5 kg 105.1 kg    General appearance: Adult female, comfortable on vent  Eyes: Anicteric HENT: Moist oral mucosa Neck: Trachea is midline, mild secretions noted Lungs: resps even non labored on vent, few scattered rhonchi  CV: S1-S2 appreciated Abdomen: Soft, non-tender; non-distended, BS present  Extremities: Warm, 1+ edema Neuro: Arouses easily    Assessment & Plan:   Acute on chronic respiratory failure following cardiac arrest -Continue vent support with daily weaning trials (had tolerated ATC; however, not for past 2 days due to hypoxia). -needs volume removal with HD as able   Volume overload - volume removal with HD  Encephalopathy - improving -On Klonopin, Seroquel -Minimize sedation  Post PEA arrest - Continue supportive care  Hypotension -On midodrine  Bradycardia - per review of cardiology notes, appears to be baseline. - Monitor - Avoid any AV nodal blocking agents  MSSA pneumonia 6/2, Proteus ventilator associated pneumonia 6/11 - s/p abx course -no further interventions required  History of cardiorenal syndrome - now anuric -On intermittent hemodialysis -volume removal per HD  Diabetes -Continue SSI, levemir  Anemia - chronic. -Transfuse for Hgb < 7.  Bleeding from around trach site - resolved Extensive bruising under her breast -Heparin on hold at present -Follow H&H this afternoon  Low grade fever - PICC removed 6/30.  PCT reassuring -monitor clinically  Best practice:  Diet: Continue tube feed Pain/Anxiety/Delirium protocol (if indicated):  Midazolam PRN VAP protocol (if indicated): In place  DVT prophylaxis: Heparin gtt GI prophylaxis: Pepcid Glucose control: CXR Mobility: Bed Code Status: Full Disposition: ICU   Montey Hora, [place] [facility] Pager: [phone] - [phone].  If no answer, [phone] 10/26/2018, 9:10 AM

## 2018-10-26 NOTE — Progress Notes (Signed)
PROGRESS NOTE    Kathleen [name]  KGU:[id number] DOB: December 30, 1950 DOA: 09/22/2018 PCP: Aldine Contes, MD   Brief Narrative: Patient is a 68 y/o obese woman with chronic diastolic heart failure who failed outpatient diuretics management due to chronic kidney disease. Right heart cath on 5/29 showed markedly elevated biventricular filling pressures with normal cardiac output. She was on cardiology service, maintained on milrinone and Lasix. On the morning of 5/30 she went into PEA arrest, CPR performed for 8 minutes. Intubated and transferred to ICU. Started on CRRT 5/31. Extubated 6/3. Re-intubated early am 6/11 for hypercarbic respiratory failure and underwent tracheostomy on 6/19.  Hospital course remarkable for persistent bradycardia.  She has been started on dopamine.  Nephrology following for ultrafiltration.  Assessment & Plan:   Active Problems:   Type 2 diabetes mellitus with other specified complication (HCC)   Dyslipidemia   OBESITY   Acute on chronic diastolic CHF (congestive heart failure) (HCC)   Acute kidney injury superimposed on chronic kidney disease (HCC)   Acute respiratory failure with hypoxia (HCC)   VAP (ventilator-associated pneumonia) (HCC)   Acute on chronic respiratory failure w/ hypoxia and hypercapneia - prolonged mechanical ventilation - now s/p trach (10/14/18) - COVID negative  - secondary to decompensated HF - PCCM  following trach     - On tube feeding via feeding tube  Proteus VAP/MSSA PNA - abx course completed  PEA Arrest/Acute on chronic diastolic HF/PAH/Afib/VT/Chronic bradycardia - ASA, atorvastatin - unable to use ACEi/BB right d/t hypotension/bradycardia - Was on heparin gtt for [facility] - heparin gtt held d/t trach bleed,restart as per nephrology  AKI on CKD4 - Being followed by  nephrology -Currently on intermittent dialysis by nephrology  Hypotension/bradycardia     -On dopamine  and Midodrine  DM2/HLD - Continue levemir and  SSI - atorvastatin  Morbid obesity - monitor caloric intake  Normocytic anemia -bleeding around trach yesterday; hold heparin, monitor     - Hgb is 7.7 this AM; no significant drop from yesterday, hold on transfusion for now  Thrombocytopenia    -Mild ,continue to monitor   Nutrition Problem: Inadequate oral intake Etiology: inability to eat      DVT prophylaxis: [place] heparin Code Status: Full Family Communication: None Disposition Plan: Undetermined,patient is critically ill   Consultants: PCCM,nephrology  Procedures:Untutubaton,Tracheostomy,central line placement  Antimicrobials:  Anti-infectives (From admission, onward)   Start     Dose/Rate Route Frequency Ordered Stop   10/10/18 1800  vancomycin (VANCOCIN) IVPB 750 mg/150 ml premix  Status:  Discontinued     750 mg 150 mL/hr over 60 Minutes Intravenous Every 24 hours 10/10/18 1327 10/12/18 1011   10/07/18 1200  vancomycin (VANCOCIN) IVPB 1000 mg/200 mL premix  Status:  Discontinued     1,000 mg 200 mL/hr over 60 Minutes Intravenous Every 24 hours 10/06/18 1145 10/10/18 1327   10/07/18 0000  vancomycin (VANCOCIN) IVPB 1000 mg/200 mL premix  Status:  Discontinued     1,000 mg 200 mL/hr over 60 Minutes Intravenous Every 24 hours 10/06/18 0353 10/06/18 0357   10/07/18 0000  ceFEPIme (MAXIPIME) 2 g in sodium chloride 0.9 % 100 mL IVPB     2 g 200 mL/hr over 30 Minutes Intravenous Every 12 hours 10/06/18 1145 10/13/18 1225   10/06/18 0400  vancomycin (VANCOCIN) 2,000 mg in sodium chloride 0.9 % 500 mL IVPB     2,000 mg 250 mL/hr over 120 Minutes Intravenous  Once 10/06/18 0353 10/06/18 0632   10/06/18 0400  ceFEPIme (MAXIPIME) 2 g in sodium chloride 0.9 % 100 mL IVPB  Status:  Discontinued     2 g 200 mL/hr over 30 Minutes Intravenous Every 12 hours 10/06/18 0353 10/06/18 0356   10/06/18 0400  ceFEPIme (MAXIPIME) 2 g in sodium chloride 0.9 % 100 mL  IVPB  Status:  Discontinued     2 g 200 mL/hr over 30 Minutes Intravenous Every 24 hours 10/06/18 0356 10/06/18 0357   10/06/18 0400  ceFEPIme (MAXIPIME) 2 g in sodium chloride 0.9 % 100 mL IVPB     2 g 200 mL/hr over 30 Minutes Intravenous  Once 10/06/18 0357 10/06/18 0613   10/05/18 2245  ceFAZolin (ANCEF) IVPB 2g/100 mL premix     2 g 200 mL/hr over 30 Minutes Intravenous  Once 10/05/18 2231 10/05/18 2303   10/05/18 2230  ceFAZolin (ANCEF) IVPB 1 g/50 mL premix  Status:  Discontinued     1 g 100 mL/hr over 30 Minutes Intravenous  Once 10/05/18 2228 10/05/18 2231   10/03/18 1520  ceFAZolin (ANCEF) 2-4 GM/100ML-% IVPB    Note to Pharmacy: Manuela Neptune   : cabinet override      10/03/18 1520 10/04/18 0329   09/29/18 1700  vancomycin (VANCOCIN) 1,250 mg in sodium chloride 0.9 % 250 mL IVPB  Status:  Discontinued     1,250 mg 166.7 mL/hr over 90 Minutes Intravenous Every 24 hours 09/28/18 1530 09/29/18 0950   09/29/18 1000  ceFAZolin (ANCEF) IVPB 2g/100 mL premix     2 g 200 mL/hr over 30 Minutes Intravenous Every 12 hours 09/29/18 0956 10/05/18 0930   09/28/18 1545  vancomycin (VANCOCIN) 2,000 mg in sodium chloride 0.9 % 500 mL IVPB     2,000 mg 250 mL/hr over 120 Minutes Intravenous  Once 09/28/18 1530 09/28/18 1849      Subjective: Patient seen and examined the bedside this morning.  Found to be bradycardic.  Sleepy/drowsy and hardly arousable .  On vent.  On dopamine for bradycardia.  Objective: Vitals:   10/26/18 0500 10/26/18 0600 10/26/18 0700 10/26/18 0715  BP: (!) 83/41 (!) 104/43 (!) 110/46   Pulse: (!) 47 (!) 48 (!) 52 (!) 49  Resp: (!) 22 20 (!) 22 (!) 31  Temp:      TempSrc:      SpO2: 100% 97% 100% 98%  Weight:      Height:        Intake/Output Summary (Last 24 hours) at 10/26/2018 0750 Last data filed at 10/26/2018 0700 Gross per 24 hour  Intake 1216.71 ml  Output -  Net 1216.71 ml   Filed Weights   10/24/18 1637 10/25/18 0400 10/26/18 0400  Weight: 100  kg 105.5 kg 105.1 kg    Examination:  General exam:,Not in distress, morbidly obese Respiratory system: Decreased air entry in the bases, no wheezes or crackles , tracheostomy Cardiovascular system: Bradycardia, S1 & S2 heard, . No JVD, murmurs, rubs, gallops or clicks. Trace pedal edema.  Central line in the right chest Gastrointestinal system: Abdomen is  soft and nontender. Central nervous system: Not Alert and oriented.  Extremities: Trace edema on bilateral lower extremities, bilateral lower extremities wrapped with dressings and boots Skin: No rashes, no icterus ,no pallor   Data Reviewed: I have personally reviewed following labs and imaging studies  CBC: Recent Labs  Lab 10/20/18 0417 10/21/18 0200 10/22/18 0316 10/24/18 0629 10/24/18 0952 10/24/18 1343 10/25/18 0524 10/26/18 0402  WBC 21.3* 14.8* 12.4* 13.2*  --   --  11.6* 13.7*  NEUTROABS 16.4*  --   --   --   --   --   --   --   HGB 9.4* 8.1* 7.9* 8.8* 9.9* 7.7* 7.6* 7.7*  HCT 33.3* 29.2* 28.3* 30.3* 29.0* 27.0* 26.2* 26.5*  MCV 94.1 95.7 95.6 95.0  --   --  94.6 95.0  PLT 180 145* 144* 144*  --   --  126* 937*   Basic Metabolic Panel: Recent Labs  Lab 10/20/18 0417  10/21/18 0200 10/22/18 0316 10/23/18 0310 10/24/18 0337 10/24/18 0952 10/25/18 0524 10/26/18 0402  NA 134*   < > 136 132* 132* 131* 130* 134* 133*  K 4.6   < > 4.7 4.8 4.2 4.7 4.8 4.5 4.5  CL 101   < > 103 100 95* 97*  --  99 98  CO2 25   < > 24 23 26 25   --  24 24  GLUCOSE 195*   < > 140* 184* 180* 186*  --  165* 262*  BUN 23   < > 44* 72* 46* 72*  --  46* 69*  CREATININE 1.45*   < > 2.35* 3.58* 2.89* 3.89*  --  2.99* 4.13*  CALCIUM 7.8*   < > 8.2* 8.3* 8.2* 8.6*  --  8.3* 8.4*  MG 2.6*  --  2.7*  --   --  2.5*  --   --  2.5*  PHOS 3.3   < > 4.0 4.7* 3.5 4.2  --  2.2* 2.6   < > = values in this interval not displayed.   GFR: Estimated Creatinine Clearance: 14.5 mL/min (A) (by C-G formula based on SCr of 4.13 mg/dL (H)). Liver  Function Tests: Recent Labs  Lab 10/22/18 0316 10/23/18 0310 10/24/18 0337 10/25/18 0524 10/26/18 0402  ALBUMIN 1.9* 1.8* 1.8* 1.8* 1.7*   No results for input(s): LIPASE, AMYLASE in the last 168 hours. No results for input(s): AMMONIA in the last 168 hours. Coagulation Profile: No results for input(s): INR, PROTIME in the last 168 hours. Cardiac Enzymes: No results for input(s): CKTOTAL, CKMB, CKMBINDEX, TROPONINI in the last 168 hours. BNP (last 3 results) No results for input(s): PROBNP in the last 8760 hours. HbA1C: No results for input(s): HGBA1C in the last 72 hours. CBG: Recent Labs  Lab 10/25/18 1536 10/25/18 1944 10/25/18 2354 10/26/18 0417 10/26/18 0739  GLUCAP 229* 245* 234* 244* 246*   Lipid Profile: No results for input(s): CHOL, HDL, LDLCALC, TRIG, CHOLHDL, LDLDIRECT in the last 72 hours. Thyroid Function Tests: No results for input(s): TSH, T4TOTAL, FREET4, T3FREE, THYROIDAB in the last 72 hours. Anemia Panel: No results for input(s): VITAMINB12, FOLATE, FERRITIN, TIBC, IRON, RETICCTPCT in the last 72 hours. Sepsis Labs: No results for input(s): PROCALCITON, LATICACIDVEN in the last 168 hours.  Recent Results (from the past 240 hour(s))  Culture, respiratory (non-expectorated)     Status: None   Collection Time: 10/17/18 11:50 AM   Specimen: Tracheal Aspirate; Respiratory  Result Value Ref Range Status   Specimen Description TRACHEAL ASPIRATE  Final   Special Requests NONE  Final   Gram Stain NO WBC SEEN RARE GRAM POSITIVE COCCI   Final   Culture   Final    Consistent with normal respiratory flora. Performed at [facility], [place] [address], [place], [place] [address]    Report Status 10/19/2018 FINAL  Final  MRSA PCR Screening     Status: None   Collection Time: 10/25/18 11:39 AM  Specimen: Nasal Mucosa; Nasopharyngeal  Result Value Ref Range Status   MRSA by PCR NEGATIVE NEGATIVE Final    Comment:        The GeneXpert MRSA Assay (FDA  approved for NASAL specimens only), is one component of a comprehensive MRSA colonization surveillance program. It is not intended to diagnose MRSA infection nor to guide or monitor treatment for MRSA infections. Performed at [facility], [place] [address], [place], [place] [address]   Culture, respiratory (non-expectorated)     Status: None (Preliminary result)   Collection Time: 10/25/18  6:23 PM   Specimen: Tracheal Aspirate; Respiratory  Result Value Ref Range Status   Specimen Description TRACHEAL ASPIRATE  Final   Special Requests NONE  Final   Gram Stain   Final    ABUNDANT WBC PRESENT, PREDOMINANTLY PMN ABUNDANT GRAM NEGATIVE RODS FEW GRAM POSITIVE RODS RARE GRAM POSITIVE COCCI Performed at [facility], [place] [address] [place], [place] [address]    Culture PENDING  Incomplete   Report Status PENDING  Incomplete         Radiology Studies: No results found.      Scheduled Meds: . aspirin  81 mg Per Tube Daily  . atorvastatin  40 mg Oral q1800  . chlorhexidine gluconate (MEDLINE KIT)  15 mL Mouth Rinse BID  . Chlorhexidine Gluconate Cloth  6 each Topical Daily  . Chlorhexidine Gluconate Cloth  6 each Topical Q0600  . darbepoetin (ARANESP) injection - DIALYSIS  100 mcg Intravenous Q Wed-HD  . feeding supplement (PRO-STAT SUGAR FREE 64)  30 mL Per Tube TID  . insulin aspart  0-20 Units Subcutaneous Q4H  . insulin detemir  36 Units Subcutaneous BID  . mouth rinse  15 mL Mouth Rinse 10 times per day  . midodrine  15 mg Oral TID WC  . polyethylene glycol  17 g Oral Daily  . sodium chloride flush  10-40 mL Intracatheter Q12H   Continuous Infusions: . sodium chloride Stopped (10/20/18 1934)  . sodium chloride    . sodium chloride    . DOPamine 3.5 mcg/kg/min (10/26/18 0700)  . famotidine (PEPCID) IV 20 mg (10/25/18 1426)  . feeding supplement (VITAL 1.5 CAL) 1,000 mL (10/25/18 1429)     LOS: 34 days    Time spent:35 mins.       Shelly Coss, MD [facility] Pager [phone]  If 7PM-7AM, please contact night-coverage [email or web address] Password [email or web address] 10/26/2018, 7:50 AM

## 2018-10-26 NOTE — Progress Notes (Signed)
Pt HR went from 50s to 38, and pt also became hypotensive to 70s. At this time pt was asked if she felt light headed and she nodded yes. MD paged for symptomatic bradycardia, 0.5 mg of Atropine was given while awaiting phone call. Pads were placed on pt and pt connected to the zoll. Pt is now very lethargic but responsive to voice and painful stimuli.   MD gave order to start Dopamine at 2.5 mcg. While on camera with him, the dose was increased via verbal order and then titrated back down to 3.5 mcg. An order was also placed for an arterial line to get a more accurate BP reading. Will continue to monitor pt closely this shift.

## 2018-10-26 NOTE — Progress Notes (Addendum)
Inpatient Diabetes Program Recommendations  AACE/ADA: New Consensus Statement on Inpatient Glycemic Control (2015)  Target Ranges:  Prepandial:   less than 140 mg/dL      Peak postprandial:   less than 180 mg/dL (1-2 hours)      Critically ill patients:  140 - 180 mg/dL    Review of Glycemic Control  Current orders for Inpatient glycemic control:  Levemir 40 units bid Novolog 0-20 units q4  Inpatient Diabetes Program Recommendations:    Noted glucose trends increased in the 200's after Tube feed rate increased. Noted Levemir increased from 36 units to 40 units bid.  Patient critically ill vented still. Consider utilizing ICU glycemic control order set Phase 1 SQ insulin RESISTANT 3-9 units Q4 hours.   May need to consider Tube Feed Coverage Novolog 4 units Q4 instead of increase in basal insulin to prevent hypoglycemia if tube feeds are stopped or held.  Thanks,  Tama Headings RN, MSN, BC-ADM Inpatient Diabetes Coordinator Team Pager [phone] (8a-5p)

## 2018-10-27 DIAGNOSIS — I4821 Permanent atrial fibrillation: Secondary | ICD-10-CM

## 2018-10-27 DIAGNOSIS — J9611 Chronic respiratory failure with hypoxia: Secondary | ICD-10-CM

## 2018-10-27 DIAGNOSIS — R579 Shock, unspecified: Secondary | ICD-10-CM

## 2018-10-27 DIAGNOSIS — I959 Hypotension, unspecified: Secondary | ICD-10-CM

## 2018-10-27 LAB — GLUCOSE, CAPILLARY
Glucose-Capillary: 207 mg/dL — ABNORMAL HIGH (ref 70–99)
Glucose-Capillary: 194 mg/dL — ABNORMAL HIGH (ref 70–99)
Glucose-Capillary: 256 mg/dL — ABNORMAL HIGH (ref 70–99)
Glucose-Capillary: 184 mg/dL — ABNORMAL HIGH (ref 70–99)
Glucose-Capillary: 114 mg/dL — ABNORMAL HIGH (ref 70–99)

## 2018-10-27 LAB — RENAL FUNCTION PANEL
Albumin: 2.3 g/dL — ABNORMAL LOW (ref 3.5–5.0)
Anion gap: 12 (ref 5–15)
BUN: 37 mg/dL — ABNORMAL HIGH (ref 8–23)
CO2: 26 mmol/L (ref 22–32)
Calcium: 8.4 mg/dL — ABNORMAL LOW (ref 8.9–10.3)
Chloride: 96 mmol/L — ABNORMAL LOW (ref 98–111)
Creatinine, Ser: 2.9 mg/dL — ABNORMAL HIGH (ref 0.44–1.00)
GFR calc Af Amer: 19 mL/min — ABNORMAL LOW (ref >60)
GFR calc non Af Amer: 16 mL/min — ABNORMAL LOW (ref >60)
Glucose, Bld: 220 mg/dL — ABNORMAL HIGH (ref 70–99)
Phosphorus: 2.2 mg/dL — ABNORMAL LOW (ref 2.5–4.6)
Potassium: 4.1 mmol/L (ref 3.5–5.1)
Sodium: 134 mmol/L — ABNORMAL LOW (ref 135–145)

## 2018-10-27 LAB — CBC
HCT: 25.6 % — ABNORMAL LOW (ref 36.0–46.0)
Hemoglobin: 7.4 g/dL — ABNORMAL LOW (ref 12.0–15.0)
MCH: 27.7 pg (ref 26.0–34.0)
MCHC: 28.9 g/dL — ABNORMAL LOW (ref 30.0–36.0)
MCV: 95.9 fL (ref 80.0–100.0)
Platelets: 99 10*3/uL — ABNORMAL LOW (ref 150–400)
RBC: 2.67 MIL/uL — ABNORMAL LOW (ref 3.87–5.11)
RDW: 29 % — ABNORMAL HIGH (ref 11.5–15.5)
WBC: 11.3 10*3/uL — ABNORMAL HIGH (ref 4.0–10.5)
nRBC: 0.7 % — ABNORMAL HIGH (ref 0.0–0.2)

## 2018-10-27 LAB — MAGNESIUM: Magnesium: 2.2 mg/dL (ref 1.7–2.4)

## 2018-10-27 MED ORDER — MIDAZOLAM HCL 2 MG/2ML IJ SOLN: 0.5000 mg | INTRAMUSCULAR | Status: DC | PRN

## 2018-10-27 MED ORDER — MIDODRINE HCL 5 MG PO TABS
15.0000 mg | ORAL_TABLET | Freq: Three times a day (TID) | ORAL | Status: DC
  Administered 2018-10-27 – 2018-10-29 (×6): 15 mg
  Filled 2018-10-27 (×6): qty 3

## 2018-10-27 MED ORDER — SODIUM CHLORIDE 0.9 % IV SOLN
1.0000 g | INTRAVENOUS | Status: AC
  Administered 2018-10-27 – 2018-10-31 (×5): 1 g via INTRAVENOUS
  Filled 2018-10-27 (×5): qty 1

## 2018-10-27 MED ORDER — ONDANSETRON HCL 4 MG/2ML IJ SOLN
4.0000 mg | Freq: Once | INTRAMUSCULAR | Status: AC
  Administered 2018-10-27: 4 mg via INTRAVENOUS

## 2018-10-27 MED ORDER — CHLORHEXIDINE GLUCONATE CLOTH 2 % EX PADS
6.0000 | MEDICATED_PAD | Freq: Every day | CUTANEOUS | Status: DC
  Administered 2018-10-27: 6 via TOPICAL

## 2018-10-27 NOTE — Plan of Care (Signed)
Patient remained on full support on ventilator overnight. Follows commands and able to communicate. No complaints- no PRNs given. Received HD in the afternoon/evening and removed 2.5L. Remains on Dopamine, required temporary increase in dosage while sleeping, was able to wean back down to original dose. See MAR for details. No urine output or BM overnight. Wounds remain unchaged.   Problem: Clinical Measurements: Goal: Diagnostic test results will improve Outcome: Progressing   Problem: Elimination: Goal: Will not experience complications related to bowel motility Outcome: Progressing   Problem: Pain Managment: Goal: General experience of comfort will improve Outcome: Progressing   Problem: Role Relationship: Goal: Ability to communicate will improve Outcome: Progressing   Problem: Activity: Goal: Ability to tolerate increased activity will improve Outcome: Not Progressing

## 2018-10-27 NOTE — Progress Notes (Signed)
NAME:  Kathleen [name], MRN:  [id number], DOB:  02-24-1951, LOS: 66 ADMISSION DATE:  09/22/2018, CONSULTATION DATE: 09/24/2018 REFERRING MD: Candee Furbish MD, CHIEF COMPLAINT: Cardiac arrest  Brief History   68 y/o obese woman with chronic diastolic heart failure who failed outpatient diuretics management due to chronic kidney disease.  Right heart cath on 5/29 showed markedly elevated biventricular filling pressures with normal cardiac output.  She was on cardiology service, maintained on milrinone and Lasix.  On the morning of 5/30 she went into PEA arrest, CPR performed for 8 minutes.  Intubated and transferred to ICU.  Started on CRRT 5/31.  Extubated 6/3.  Re-intubated early am 6/11 for hypercarbic respiratory failure and underwent tracheostomy on 6/19  Past Medical History   has a past medical history of Atrial flutter ([place]), Bradycardia, Chronic diastolic (congestive) heart failure ([place]) (01/10/2015), CKD (chronic kidney disease), stage IV ([place]), Degenerative joint disease of hand, Diabetes mellitus, Dyslipidemia, Fecal occult blood test positive, GERD (gastroesophageal reflux disease), Headache, Hypertension, Inadequate material resources, Irritable bowel syndrome, Morbid obesity ([place]), Obesity hypoventilation syndrome ([place]), Post-menopausal bleeding, and Shortness of breath dyspnea.  Significant Hospital Events   5/28 Admit 5/29 RHC 5/30 PEA arrest, intubated/ CRRT 5/31 Milrinone stopped due to ectopy; brady episode- amio stopped 6/02 Attempt at SBT, chest x-ray with worsening pulmonary edema, CVVHD for volume removal 6/03 Extubated > remain on CRRT.  Refused nocturnal BiPAP 6/04 Refused nocturnal BiPAP 6/10 off CRRT 6/11 Intubated early am with hypercarbia, concern for RUL PNA; abx broadened; restarted on CRRT 6/16  No events overnight, weaning on high PS 6/17  failed vent wean, remains on levophed, midodrine and CRRT 6/18  Weaned for 5 hours on PSV 8/5 before requiring full support;  ongoing CRRT, remains on levophed 14 mcg 6/19 - TRACH + Dr Erskine Emery 6/20 -. On IV heparin gt, levophed gtt with midodrine. Off fent gtt. Pressopr needs down 6/26 - TCT tolerated for the past 24 hours  6/27 - Stable on TCT now >48hrs, tolerated first session of iHD  6/30 hypotension / bradycardia overnight, started on dopamine 7/2 Placed on ATC at 40% fiO2  Consults:  Cardiology PCCM  Nephrology  EP  Procedures:  Lt PICC 5/29 >> 6/30 OETT 5/30 >> 6/3; 6/11 > 6/19 - TRACH (Dr Erskine Emery) >> R IJ HD cath 5/30 >> Aline left ulnar 5/30 >> 6/2  Significant Diagnostic Tests:  TTE 5/28 >>The left ventricle has normal systolic function with an ejection fraction of 60-65%. . The right ventricle has normal systolic function. The cavity was mildly enlarged.   Right heart cath 5/29 RA = 24 RV = 94/26 PA = 95/36 (53) PCW = 30 (v = 50) Fick cardiac output/index = 6.0/2.7 PVR =3.9 WU FA sat = 91% PA sat = 58%, 58% SVC 60%  CXR 6/15> Bilateral ASD LLL > R. ETT, GT, LIJ and RIJ CVCs stable position.   Micro Data:  SARS coronavirus 2 cepheid 5/28 >> neg MRSA PCR 5/28 >> neg Trach aspirate 6/2 >> MSSA BCx2 6/2 >> negative ...................... Tracheal aspirate 6/11 >> few GNR >> few proteus and few candida tropicalis BCx2 6/11 >> negative  Antimicrobials:  Vancomycin 6/3 > 6/4 Cefazolin 6/4 >> 6/10 ...........................Marland Kitchen Vanco 6/11 >> 6/17 Cefepime 6/11 >>6/18  Interim history/subjective:  Remains on dopamine at 4 mcg/kg/min.  HR in 50s  Objective   Blood pressure (!) 116/25, pulse (!) 55, temperature 99.5 F (37.5 C), temperature source Oral, resp. rate 18, height 5' (  1.524 m), weight 104.8 kg, SpO2 100 %.    Vent Mode: PRVC FiO2 (%):  [50 %-60 %] 60 % Set Rate:  [20 bmp] 20 bmp Vt Set:  [360 mL] 360 mL PEEP:  [5 cmH20] 5 cmH20 Pressure Support:  [10 cmH20] 10 cmH20 Plateau Pressure:  [20 OIZ12-45 cmH20] 21 cmH20   Intake/Output Summary (Last 24 hours) at  10/27/2018 0854 Last data filed at 10/27/2018 0800 Gross per 24 hour  Intake 1885.93 ml  Output 2500 ml  Net -614.07 ml   Filed Weights   10/26/18 1545 10/26/18 1935 10/27/18 0600  Weight: 105 kg 103 kg 104.8 kg    General appearance: Adult female, comfortable on ATC Eyes: Anicteric HENT: Moist oral mucosa Neck: Trachea is midline, secretions improved Lungs: Diminished in bases CV: Bradycardic, regular Abdomen: Soft, non-tender; non-distended, BS present  Extremities: Warm, 1+ edema Neuro: Awake, nods appropriately,  follows basic commands    Assessment & Plan:   Acute on chronic respiratory failure following cardiac arrest -Continue ATC, hope to continue to wean FiO2 and length of ATC -Likely needs continued nocturnal vent support at least in the short term -needs volume removal with HD as able   Volume overload - volume removal with HD  Encephalopathy - resolved -Minimize sedation  Post PEA arrest - Continue supportive care  Hypotension -Continue midodrine + dopamine  Bradycardia - per review of cardiology notes, appears to be baseline.  ? Exacerbated by midodrine - Have asked EP to re-consult and consider PPM given continued need for dopamine - Avoid any AV nodal blocking agents  MSSA pneumonia 6/2, Proteus ventilator associated pneumonia 6/11 - s/p abx course -no further interventions required  History of cardiorenal syndrome - now anuric -On intermittent hemodialysis -volume removal per HD  Diabetes -Continue SSI, levemir  Anemia - chronic. -Transfuse for Hgb < 7.  Bleeding from around trach site - resolved Extensive bruising under her breast -Continue to hold heparin.   Best practice:  Diet: Continue tube feed Pain/Anxiety/Delirium protocol (if indicated):  Midazolam PRN VAP protocol (if indicated): In place  DVT prophylaxis: None GI prophylaxis: Pepcid Glucose control: SSI Mobility: Bedrest Code Status: Full Disposition: ICU   Montey Hora, PA - [facility] Pager: [phone] - [phone].  If no answer, [phone] 10/27/2018, 8:54 AM

## 2018-10-27 NOTE — Progress Notes (Signed)
Inpatient Diabetes Program Recommendations  AACE/ADA: New Consensus Statement on Inpatient Glycemic Control (2015)  Target Ranges:  Prepandial:   less than 140 mg/dL      Peak postprandial:   less than 180 mg/dL (1-2 hours)      Critically ill patients:  140 - 180 mg/dL    Review of Glycemic Control  Current orders for Inpatient glycemic control:  Levemir 40 units bid Novolog 0-20 units q4  Inpatient Diabetes Program Recommendations:    Noted glucose trends increased in the 200's after Tube feed rate increased. Noted Levemir increased from 36 units to 40 units bid.  Patient critically ill vented still. Consider utilizing ICU glycemic control order set Phase 1 SQ insulin RESISTANT 3-9 units Q4 hours.   May need to consider Tube Feed Coverage Novolog 4 units Q4.  Thanks,  Tama Headings RN, MSN, BC-ADM Inpatient Diabetes Coordinator Team Pager [phone] (8a-5p)

## 2018-10-27 NOTE — Progress Notes (Addendum)
Physical Therapy Treatment Patient Details Name: Kathleen [name] MRN: [id number] DOB: 1950-06-17 Today's Date: 10/27/2018    History of Present Illness Kathleen [name] is a 68 y.o. female with history of permanent AF, hypertension, super morbid obesity, DM2, dyslipidemia, obesity hypoventilation syndrome (on home O2 with no OSA by prior PSG but showed nocturnal hypoxemia), CKD stage IV (baseline creatinine about 1.9), chronic diastolic CHF.  Admit for fluid overload. Also with pulmonary HTN.  PEA arrest 5/30, transfer to the ICU, extubater 6/3.  Intubated 10/06/18.  Trach on 10/14/18.      PT Comments    Pt admitted with above diagnosis. Pt currently with functional limitations due to the deficits listed below (see PT Problem List). Pt was able to sit EOB with min guard assist and then transfer to recliner via Stedy with +2 heavy max assist for standing.  Pt on 100% FiO2 trach collar as RT felt this would be best with activity.  Then changed pt to 60% FiO2 once pt was in chair.   Pt progressing.  Slow processing of information with delayed response time. Pt will benefit from skilled PT to increase their independence and safety with mobility to allow discharge to the venue listed below.     Follow Up Recommendations  CIR(If will can build up her stamina.)     Equipment Recommendations  None recommended by PT;Other (comment)(TBA)    Recommendations for Other Services Rehab consult     Precautions / Restrictions Precautions Precautions: Fall Precaution Comments: watch O2 sats; for session on 100% trach collar and then moved to 60% Restrictions Weight Bearing Restrictions: No    Mobility  Bed Mobility Overal bed mobility: Needs Assistance Bed Mobility: Supine to Sit     Supine to sit: Max assist     General bed mobility comments: pt needed significant assist to scoot, Also needed truncal assist to come up via right elbow.  Transfers Overall transfer level: Needs assistance    Transfers: Sit to/from Stand;Stand Pivot Transfers Sit to Stand: Max assist;+2 physical assistance;+2 safety/equipment;From elevated surface Stand pivot transfers: +2 physical assistance;Total assist       General transfer comment: Used STedy with pt needing max assist to power up.  Had to use pad to assist.  Took 3 attempts with bed elevated and bed max inflated as she could not stand with out these provisions.  Once in [place] moved pt to chair.  Had pt stand again however pt still with difficulty with full upright stance and can stand for 5 seconds.  Last stand, moved the Stedy buttock pads and pt placed in chair.    Pt overall very slow to respond and at times has a "blank stare" when PT talking to her.  Slow processing of commands and difficulty with following commands.   Ambulation/Gait                 Stairs             Wheelchair Mobility    Modified Rankin (Stroke Patients Only)       Balance Overall balance assessment: Needs assistance Sitting-balance support: Bilateral upper extremity supported;Single extremity supported Sitting balance-Leahy Scale: Fair Sitting balance - Comments: Pt able to sit EOB with min guard assist prior to transfer.  No UE support needed.    Standing balance support: Bilateral upper extremity supported;During functional activity Standing balance-Leahy Scale: Poor Standing balance comment: reliant on external support and UE support  Cognition Arousal/Alertness: Awake/alert Behavior During Therapy: WFL for tasks assessed/performed Overall Cognitive Status: Within Functional Limits for tasks assessed                                 General Comments: I suspect anxiety keeping pt from following directions at EOB sometimes.      Exercises General Exercises - Lower Extremity Ankle Circles/Pumps: AROM;Both;10 reps;Supine Long Arc Quad: AROM;Both;10 reps;Seated Heel Slides:  AROM;Both;10 reps;Supine    General Comments        Pertinent Vitals/Pain Pain Assessment: No/denies pain    Home Living                      Prior Function            PT Goals (current goals can now be found in the care plan section) Acute Rehab PT Goals Patient Stated Goal: to go home Progress towards PT goals: Progressing toward goals    Frequency    Min 3X/week      PT Plan Current plan remains appropriate    Co-evaluation              AM-PAC PT "6 Clicks" Mobility   Outcome Measure  Help needed turning from your back to your side while in a flat bed without using bedrails?: Total Help needed moving from lying on your back to sitting on the side of a flat bed without using bedrails?: Total Help needed moving to and from a bed to a chair (including a wheelchair)?: Total Help needed standing up from a chair using your arms (e.g., wheelchair or bedside chair)?: Total Help needed to walk in hospital room?: Total Help needed climbing 3-5 steps with a railing? : Total 6 Click Score: 6    End of Session Equipment Utilized During Treatment: Gait belt;Oxygen(100% trach collar with activity, 60% at rest) Activity Tolerance: Other (comment);Patient limited by fatigue(limited by anxiety) Patient left: in chair;with call bell/phone within reach;with chair alarm set Nurse Communication: Mobility status;Need for lift equipment PT Visit Diagnosis: Other abnormalities of gait and mobility (R26.89);Muscle weakness (generalized) (M62.81)     Time: 6384-5364 PT Time Calculation (min) (ACUTE ONLY): 21 min  Charges:  $Therapeutic Activity: 8-22 mins                     [place] Pager:  [phone]  Office:  [place] 10/27/2018, 10:45 AM

## 2018-10-27 NOTE — Progress Notes (Addendum)
Progress Note  Patient Name: Kathleen [name] Date of Encounter: 10/27/2018  Primary Cardiologist: Fransico Him, MD   EP is re-called to the case by CCM   Subjective   Complains of abdominal pain, back on vent via trach. On dobutamine due to hypotension  Inpatient Medications    Scheduled Meds: . aspirin  81 mg Per Tube Daily  . atorvastatin  40 mg Oral q1800  . chlorhexidine gluconate (MEDLINE KIT)  15 mL Mouth Rinse BID  . Chlorhexidine Gluconate Cloth  6 each Topical Daily  . Chlorhexidine Gluconate Cloth  6 each Topical Q0600  . Chlorhexidine Gluconate Cloth  6 each Topical Q0600  . darbepoetin (ARANESP) injection - DIALYSIS  100 mcg Intravenous Q Wed-HD  . feeding supplement (PRO-STAT SUGAR FREE 64)  30 mL Per Tube TID  . insulin aspart  0-20 Units Subcutaneous Q4H  . insulin detemir  40 Units Subcutaneous BID  . mouth rinse  15 mL Mouth Rinse 10 times per day  . midodrine  15 mg Per Tube TID WC  . polyethylene glycol  17 g Oral Daily   Continuous Infusions: . sodium chloride Stopped (10/20/18 1934)  . sodium chloride    . sodium chloride    . sodium chloride    . sodium chloride    . DOPamine 4 mcg/kg/min (10/27/18 1000)  . famotidine (PEPCID) IV Stopped (10/26/18 1457)  . feeding supplement (VITAL 1.5 CAL) 45 mL/hr at 10/27/18 0700   PRN Meds: sodium chloride, sodium chloride, sodium chloride, sodium chloride, sodium chloride, acetaminophen (TYLENOL) oral liquid 160 mg/5 mL, alteplase, alteplase, bisacodyl, docusate, heparin, heparin, heparin, lidocaine (PF), lidocaine-prilocaine, midazolam, ondansetron, pentafluoroprop-tetrafluoroeth   Vital Signs    Vitals:   10/27/18 1000 10/27/18 1116 10/27/18 1125 10/27/18 1152  BP: (!) 101/46   (!) 94/25  Pulse: (!) 55  (!) 55 (!) 54  Resp: (!) 31  (!) 28 (!) 27  Temp:  98.4 F (36.9 C)    TempSrc:  Oral    SpO2: 93%  97% 95%  Weight:      Height:        Intake/Output Summary (Last 24 hours) at 10/27/2018 1216  Last data filed at 10/27/2018 1000 Gross per 24 hour  Intake 1629.44 ml  Output 2500 ml  Net -870.56 ml   Last 3 Weights 10/27/2018 10/26/2018 10/26/2018  Weight (lbs) 231 lb 0.7 oz 227 lb 1.2 oz 231 lb 7.7 oz  Weight (kg) 104.8 kg 103 kg 105 kg      Telemetry    AFib 50's generally, at times is very regular, ? - Personally Reviewed  ECG    10/25/2018 AFib, baseline movement V rates 65 - Personally Reviewed  Physical Exam   GEN: No acute distress.   Neck: trach Cardiac: RRR, no murmurs, rubs, or gallops.  Respiratory: CTA b/l. GI: Soft, nontender, non-distended, obese  MS: una botts b/l; No deformity. Neuro:  no gross deficits noted Psych: Normal affect   Labs    High Sensitivity Troponin:   Recent Labs  Lab 10/25/18 2211 10/25/18 2348  TROPONINIHS 29* 31*      Cardiac EnzymesNo results for input(s): TROPONINI in the last 168 hours. No results for input(s): TROPIPOC in the last 168 hours.   Chemistry Recent Labs  Lab 10/25/18 0524 10/26/18 0402 10/27/18 0409  NA 134* 133* 134*  K 4.5 4.5 4.1  CL 99 98 96*  CO2 24 24 26   GLUCOSE 165* 262* 220*  BUN 46*  69* 37*  CREATININE 2.99* 4.13* 2.90*  CALCIUM 8.3* 8.4* 8.4*  ALBUMIN 1.8* 1.7* 2.3*  GFRNONAA 15* 10* 16*  GFRAA 18* 12* 19*  ANIONGAP 11 11 12      Hematology Recent Labs  Lab 10/25/18 0524 10/26/18 0402 10/27/18 0409  WBC 11.6* 13.7* 11.3*  RBC 2.77* 2.79* 2.67*  HGB 7.6* 7.7* 7.4*  HCT 26.2* 26.5* 25.6*  MCV 94.6 95.0 95.9  MCH 27.4 27.6 27.7  MCHC 29.0* 29.1* 28.9*  RDW 28.0* 28.3* 29.0*  PLT 126* 117* 99*    BNPNo results for input(s): BNP, PROBNP in the last 168 hours.   DDimer No results for input(s): DDIMER in the last 168 hours.   Radiology    No results found.  Cardiac Studies   09/23/2018: RHC RA = 24 RV = 94/26 PA = 95/36 (53) PCW = 30 (v = 50) Fick cardiac output/index = 6.0/2.7 PVR =3.9 WU FA sat = 91% PA sat = 58%, 58% SVC 60%  Assessment: 1. Severe pulmonary  HTN mostly due to pulmonary venous HTN but likely also component of OHS/OSA 2. Cardiorenal syndrome  09/22/2018: TTE IMPRESSIONS 1. The left ventricle has normal systolic function with an ejection fraction of 60-65%. The cavity size was normal. Left ventricular diastolic Doppler parameters are indeterminate. No evidence of left ventricular regional wall motion abnormalities.  2. The right ventricle has normal systolic function. The cavity was mildly enlarged. There is no increase in right ventricular wall thickness.  3. Right atrial size was mildly dilated.  4. There is mild mitral annular calcification present.  5. The aortic valve is tricuspid. Mild thickening of the aortic valve. Mild calcification of the aortic valve. Aortic valve regurgitation is trivial by color flow Doppler.  6. The aortic root is normal in size and structure.  FINDINGS  Left Ventricle: The left ventricle has normal systolic function, with an ejection fraction of 60-65%. The cavity size was normal. There is no increase in left ventricular wall thickness. Left ventricular diastolic Doppler parameters are indeterminate.  No evidence of left ventricular regional wall motion abnormalities..  Right Ventricle: The right ventricle has normal systolic function. The cavity was mildly enlarged. There is no increase in right ventricular wall thickness.  Left Atrium: Left atrial size was normal in size.  Right Atrium: Right atrial size was mildly dilated. Right atrial pressure is estimated at 10 mmHg.  Interatrial Septum: No atrial level shunt detected by color flow Doppler.  Pericardium: There is no evidence of pericardial effusion.  Mitral Valve: The mitral valve is normal in structure. There is mild mitral annular calcification present. Mitral valve regurgitation is trivial by color flow Doppler.  Tricuspid Valve: The tricuspid valve is normal in structure. Tricuspid valve regurgitation was not visualized by color  flow Doppler.  Aortic Valve: The aortic valve is tricuspid Mild thickening of the aortic valve. Mild calcification of the aortic valve. Aortic valve regurgitation is trivial by color flow Doppler.  Pulmonic Valve: The pulmonic valve was normal in structure. Pulmonic valve regurgitation is trivial by color flow Doppler.  Aorta: The aortic root is normal in size and structure.  Venous: The inferior vena cava is normal in size with greater than 50% respiratory variability.  Patient Profile     68 y.o. female of permanent Afib, HTN, super morbid obesity, DM2, dyslipidemia, OHS, CKD stage IV. Admitted to [facility] 09/24/18 with progressive edema and HF symptoms. Weight 301 lbs on admission. RHC showed markedly elevated biventricular filling pressures with normal  cardiac output (thought overestimated due to weight). Pt had PEA arrest on 5/30 2/2 hypoxia pulmonary edema. Intubated. She was extubated and subsequently re-intubated 6/11 with worsening RUL airspace disease. Treated for MSSA PNA with vanc/cefipime. Underwent trach 6/19. Pt also required initiation of CVVHD this admission and now on HD  EP was called initially to the case 10/17/2018 for slow AFib with rates 30's requiring dopamine.  She was noted to be awake on vent through trach and follows commands. Her pressures drop significantly with attempts to sit up despite midodrine. Dr. Lovena Le saw the patient felt with mulitple medical problems admitted with respiratory failure and PEA arrest, found to have MSSA pneumonia.>> s/p tracheostomy insertion, and transitioned to ESRD on HD, the patient was a very poor candiate for PPM. Overall prognosis seemed very poor and hoped to be able to avoid PPM.  Looks like dopamine off 10/20/2018 though on levo.  levio was off 10/21/2018, and on 10/25/2018 dopamine resumed 2/2 hypotension  CCM team has recalled EP to the case with unable to get off dopamine 2/2 hypotension. This is apparently all CCM feels is keeping her  from a final disposition at this juncture.  Dompamine currently @ 68mg/kg/hr  Assessment & Plan    1. Permanent AFib  Not currently on a/c (was on heparin though had bleeding around trach, and extensive ecchymosis chest  2. Persistent (siunds like intractable)  hypotension     On midodrine 173mTID Unclear etiology.  Does not seem to be due to bradycardia.  V rates are currently 50s-60s.  In d/w the RN who is very familiar with the patient she was placed back on Dopamine 6/30/220 evening for marked hypotension with SBP 70's and bradycardia 30's. Since then she reports that the dopamine has been able to get as low as 86m786mthough the BP was the limiting factor with HR high 40's-50's, today she has had to titrate 2/2 to hypotension with HR steady in the 50's  The patient is OOB to chair, back on vent (on/off trach collar though fatigues), she is AAO x3, and very pleasant. No CP, palpitations.  I will review the case with Dr. AllRayann Hemanill see the patient later today   For questions or updates, please contact [facility] Please consult [email or web address] for contact info under    Signed, RenBaldwin JamaicaA-C  10/27/2018, 12:16 PM     I have seen, examined the patient, and reviewed the above assessment and plan.  Changes to above are made where necessary.  On exam, very ill appearing, iRRR.  The patient has had a very prolonged hospital course.  She remains very ill with multiple ongoing and acute medical issues.  One of her main issues appears to be hypotension.  This is out of proportion to her bradycardia, which current is improved.  I agree with Dr TayLovena Leat she is not a candidate for permanent pacemaker or EP interventions at this time. Wean dopamine to off as BP allows.  Palliative care consultation for goals of care would seem most appropriate at this time.  Electrophysiology team to see as needed while here. Please call with questions.  Co Sign: JamThompson GrayerD 10/27/2018  4:39 PM

## 2018-10-27 NOTE — Progress Notes (Signed)
[facility] NEPHROLOGY PROGRESS NOTE  Assessment/ Plan: Pt is a 68 y.o. yo female with CHF failed outpatient diuretics therapy underwent RHC on 5/29 with markedly elevated filling pressure, subsequent PEA arrest on 5/30, intubated and ROSC achieved.  She was a started on CRRT for AKI.  #Acute kidney injury likely ATN secondary to shock, anuric: Transitioned to [facility] on 6/27. Using right subclavian TDC.  She is on midodrine for hypotension. -Now on dopamine for bradycardia.  Heart rate and blood pressure better.  Had dialysis yesterday with UF of 2.5 L, tolerated well.  Plan for next HD tomorrow. Dispo: If she remains on vent or trach then she may need LTAC  #CKD stage IV with baseline creatinine around 1.7-1.9 due to diabetes and hypertension.    #Chronic respiratory failure: Status post tracheostomy.  Back on vent, per pulmonary.  #Chronic diastolic heart failure: Volume optimization during dialysis.  #Anemia due to chronic disease and acute blood loss: Continue ESA, had IV iron 6/28. Monitor cbc.  #Bradycardia, PEA cardiac arrest: On dopamine now.  Per critical care team.  Discussed with the primary team.  Subjective: Seen and examined at bedside.  No new event.  On dopamine.  No chest pain, shortness of breath.  Blood pressure and heart rate better. Objective Vital signs in last 24 hours: Vitals:   10/27/18 0500 10/27/18 0600 10/27/18 0700 10/27/18 0731  BP: (!) 95/45 (!) 101/45 (!) 84/41   Pulse: 61 62 (!) 56   Resp: (!) 23 20 (!) 25   Temp:    99.5 F (37.5 C)  TempSrc:    Oral  SpO2: 100% 100% 100%   Weight:  104.8 kg    Height:       Weight change: -0.1 kg  Intake/Output Summary (Last 24 hours) at 10/27/2018 0815 Last data filed at 10/27/2018 0700 Gross per 24 hour  Intake 1763.02 ml  Output 2500 ml  Net -736.98 ml       Labs: Basic Metabolic Panel: Recent Labs  Lab 10/25/18 0524 10/26/18 0402 10/27/18 0409  NA 134* 133* 134*  K 4.5 4.5 4.1   CL 99 98 96*  CO2 _0 GLUCOSE 165* 262* 220*  BUN 46* 69* 37*  CREATININE 2.99* 4.13* 2.90*  CALCIUM 8.3* 8.4* 8.4*  PHOS 2.2* 2.6 2.2*   Liver Function Tests: Recent Labs  Lab 10/25/18 0524 10/26/18 0402 10/27/18 0409  ALBUMIN 1.8* 1.7* 2.3*   No results for input(s): LIPASE, AMYLASE in the last 168 hours. No results for input(s): AMMONIA in the last 168 hours. CBC: Recent Labs  Lab 10/22/18 0316 10/24/18 0629  10/25/18 0524 10/26/18 0402 10/27/18 0409  WBC 12.4* 13.2*  --  11.6* 13.7* 11.3*  HGB 7.9* 8.8*   < > 7.6* 7.7* 7.4*  HCT 28.3* 30.3*   < > 26.2* 26.5* 25.6*  MCV 95.6 95.0  --  94.6 95.0 95.9  PLT 144* 144*  --  126* 117* 99*   < > = values in this interval not displayed.   Cardiac Enzymes: No results for input(s): CKTOTAL, CKMB, CKMBINDEX, TROPONINI in the last 168 hours. CBG: Recent Labs  Lab 10/26/18 1551 10/26/18 1956 10/26/18 2339 10/27/18 0400 10/27/18 0735  GLUCAP 190* 148* 205* 207* 194*    Iron Studies: No results for input(s): IRON, TIBC, TRANSFERRIN, FERRITIN in the last 72 hours. Studies/Results: No results found.  Medications: Infusions: . sodium chloride Stopped (10/20/18 1934)  . sodium chloride    . sodium  chloride    . sodium chloride    . sodium chloride    . DOPamine 4 mcg/kg/min (10/27/18 0700)  . famotidine (PEPCID) IV Stopped (10/26/18 1457)  . feeding supplement (VITAL 1.5 CAL) 45 mL/hr at 10/27/18 0700    Scheduled Medications: . aspirin  81 mg Per Tube Daily  . atorvastatin  40 mg Oral q1800  . chlorhexidine gluconate (MEDLINE KIT)  15 mL Mouth Rinse BID  . Chlorhexidine Gluconate Cloth  6 each Topical Daily  . Chlorhexidine Gluconate Cloth  6 each Topical Q0600  . darbepoetin (ARANESP) injection - DIALYSIS  100 mcg Intravenous Q Wed-HD  . feeding supplement (PRO-STAT SUGAR FREE 64)  30 mL Per Tube TID  . insulin aspart  0-20 Units Subcutaneous Q4H  . insulin detemir  40 Units Subcutaneous BID  . mouth  rinse  15 mL Mouth Rinse 10 times per day  . midodrine  15 mg Oral TID WC  . polyethylene glycol  17 g Oral Daily    have reviewed scheduled and prn medications.  Physical Exam: General: On vent with trach collar, alert awake and responding. Neck: Tracheostomy site looks clean with no bleeding Heart:RRR, s1s2 nl, no rubs Lungs: coarse mechanical breath sound, no wheezing Abdomen:soft, Non-tender, non-distended Extremities: No LE edema. Dialysis Access: Right subclavian TDC.  Dron Prasad Bhandari 10/27/2018,8:15 AM  LOS: 35 days  Pager: [phone]

## 2018-10-27 NOTE — Progress Notes (Signed)
PCCM Interval Progress Note  Events: Called by RN for: 1.  Purulent foul smelling secretions from trach. 2.  Nausea despite 4mg  zofran 45-60 minutes ago.  Interventions: 1.  Asked to send trach aspirate for culture.  Start empiric cefepime. 2.  Additional 4mg  zofran x 1 now and hold tube feedings.   Montey Hora, [facility] Pager: [phone].  If no answer, [phone] 10/27/2018, 3:47 PM

## 2018-10-28 DIAGNOSIS — R579 Shock, unspecified: Secondary | ICD-10-CM

## 2018-10-28 LAB — GLUCOSE, CAPILLARY
Glucose-Capillary: 117 mg/dL — ABNORMAL HIGH (ref 70–99)
Glucose-Capillary: 125 mg/dL — ABNORMAL HIGH (ref 70–99)
Glucose-Capillary: 129 mg/dL — ABNORMAL HIGH (ref 70–99)
Glucose-Capillary: 135 mg/dL — ABNORMAL HIGH (ref 70–99)
Glucose-Capillary: 175 mg/dL — ABNORMAL HIGH (ref 70–99)
Glucose-Capillary: 243 mg/dL — ABNORMAL HIGH (ref 70–99)
Glucose-Capillary: 248 mg/dL — ABNORMAL HIGH (ref 70–99)

## 2018-10-28 LAB — RENAL FUNCTION PANEL
Albumin: 2.3 g/dL — ABNORMAL LOW (ref 3.5–5.0)
Anion gap: 13 (ref 5–15)
BUN: 55 mg/dL — ABNORMAL HIGH (ref 8–23)
CO2: 25 mmol/L (ref 22–32)
Calcium: 8.8 mg/dL — ABNORMAL LOW (ref 8.9–10.3)
Chloride: 94 mmol/L — ABNORMAL LOW (ref 98–111)
Creatinine, Ser: 4.04 mg/dL — ABNORMAL HIGH (ref 0.44–1.00)
GFR calc Af Amer: 12 mL/min — ABNORMAL LOW (ref >60)
GFR calc non Af Amer: 11 mL/min — ABNORMAL LOW (ref >60)
Glucose, Bld: 140 mg/dL — ABNORMAL HIGH (ref 70–99)
Phosphorus: 3.2 mg/dL (ref 2.5–4.6)
Potassium: 4.4 mmol/L (ref 3.5–5.1)
Sodium: 132 mmol/L — ABNORMAL LOW (ref 135–145)

## 2018-10-28 LAB — MAGNESIUM: Magnesium: 2.5 mg/dL — ABNORMAL HIGH (ref 1.7–2.4)

## 2018-10-28 LAB — CULTURE, RESPIRATORY W GRAM STAIN

## 2018-10-28 LAB — CBC
HCT: 28.9 % — ABNORMAL LOW (ref 36.0–46.0)
Hemoglobin: 8.6 g/dL — ABNORMAL LOW (ref 12.0–15.0)
MCH: 28.5 pg (ref 26.0–34.0)
MCHC: 29.8 g/dL — ABNORMAL LOW (ref 30.0–36.0)
MCV: 95.7 fL (ref 80.0–100.0)
Platelets: 108 10*3/uL — ABNORMAL LOW (ref 150–400)
RBC: 3.02 MIL/uL — ABNORMAL LOW (ref 3.87–5.11)
RDW: 29.3 % — ABNORMAL HIGH (ref 11.5–15.5)
WBC: 11.2 10*3/uL — ABNORMAL HIGH (ref 4.0–10.5)
nRBC: 0.4 % — ABNORMAL HIGH (ref 0.0–0.2)

## 2018-10-28 MED ORDER — ALTEPLASE 2 MG IJ SOLR: 2.0000 mg | Freq: Once | INTRAMUSCULAR | Status: DC | PRN

## 2018-10-28 MED ORDER — ALBUMIN HUMAN 25 % IV SOLN
50.0000 g | Freq: Once | INTRAVENOUS | Status: AC
  Administered 2018-10-28: 50 g via INTRAVENOUS
  Filled 2018-10-28: qty 100

## 2018-10-28 MED ORDER — HEPARIN SODIUM (PORCINE) 1000 UNIT/ML DIALYSIS: 1000.0000 [IU] | INTRAMUSCULAR | Status: DC | PRN

## 2018-10-28 MED ORDER — LIDOCAINE-PRILOCAINE 2.5-2.5 % EX CREA: 1.0000 "application " | TOPICAL_CREAM | CUTANEOUS | Status: DC | PRN

## 2018-10-28 MED ORDER — LIDOCAINE HCL (PF) 1 % IJ SOLN: 5.0000 mL | INTRAMUSCULAR | Status: DC | PRN

## 2018-10-28 MED ORDER — HEPARIN SODIUM (PORCINE) 1000 UNIT/ML IJ SOLN
INTRAMUSCULAR | Status: AC
  Administered 2018-10-28: 3800 [IU] via INTRAVENOUS_CENTRAL
  Filled 2018-10-28: qty 4

## 2018-10-28 MED ORDER — SODIUM CHLORIDE 0.9 % IV SOLN: 100.0000 mL | INTRAVENOUS | Status: DC | PRN

## 2018-10-28 MED ORDER — HEPARIN SODIUM (PORCINE) 1000 UNIT/ML DIALYSIS: 20.0000 [IU]/kg | INTRAMUSCULAR | Status: DC | PRN

## 2018-10-28 MED ORDER — PENTAFLUOROPROP-TETRAFLUOROETH EX AERO: 1.0000 "application " | INHALATION_SPRAY | CUTANEOUS | Status: DC | PRN

## 2018-10-28 NOTE — Progress Notes (Signed)
SLP Cancellation Note  Patient Details Name: Kathleen [name] MRN: [id number] DOB: [birth year]   Cancelled treatment:       Reason Eval/Treat Not Completed: Medical issues which prohibited therapy - remains on vent. Will continue to follow and see as medically appropriate.   [name] Inetha Maret 10/28/2018, 10:56 AM  Pollyann Glen, M.A. [place] Acute Environmental education officer [phone] Office [phone]

## 2018-10-28 NOTE — Progress Notes (Signed)
NAME:  Kathleen [name], MRN:  [id number], DOB:  1950-07-21, LOS: 12 ADMISSION DATE:  09/22/2018, CONSULTATION DATE: 09/24/2018 REFERRING MD: Candee Furbish MD, CHIEF COMPLAINT: Cardiac arrest  Brief History   68 y/o obese woman with chronic diastolic heart failure who failed outpatient diuretics management due to chronic kidney disease.  Right heart cath on 5/29 showed markedly elevated biventricular filling pressures with normal cardiac output.  She was on cardiology service, maintained on milrinone and Lasix.  On the morning of 5/30 she went into PEA arrest, CPR performed for 8 minutes.  Intubated and transferred to ICU.  Started on CRRT 5/31.  Extubated 6/3.  Re-intubated early am 6/11 for hypercarbic respiratory failure and underwent tracheostomy on 6/19  Past Medical History   has a past medical history of Atrial flutter ([place]), Bradycardia, Chronic diastolic (congestive) heart failure ([place]) (01/10/2015), CKD (chronic kidney disease), stage IV ([place]), Degenerative joint disease of hand, Diabetes mellitus, Dyslipidemia, Fecal occult blood test positive, GERD (gastroesophageal reflux disease), Headache, Hypertension, Inadequate material resources, Irritable bowel syndrome, Morbid obesity ([place]), Obesity hypoventilation syndrome ([place]), Post-menopausal bleeding, and Shortness of breath dyspnea.  Significant Hospital Events   5/28 Admit 5/29 RHC 5/30 PEA arrest, intubated/ CRRT 5/31 Milrinone stopped due to ectopy; brady episode- amio stopped 6/02 Attempt at SBT, chest x-ray with worsening pulmonary edema, CVVHD for volume removal 6/03 Extubated > remain on CRRT.  Refused nocturnal BiPAP 6/04 Refused nocturnal BiPAP 6/10 off CRRT 6/11 Intubated early am with hypercarbia, concern for RUL PNA; abx broadened; restarted on CRRT 6/16  No events overnight, weaning on high PS 6/17  failed vent wean, remains on levophed, midodrine and CRRT 6/18  Weaned for 5 hours on PSV 8/5 before requiring full support;  ongoing CRRT, remains on levophed 14 mcg 6/19 - TRACH + Dr Erskine Emery 6/20 -. On IV heparin gt, levophed gtt with midodrine. Off fent gtt. Pressopr needs down 6/26 - TCT tolerated for the past 24 hours  6/27 - Stable on TCT now >48hrs, tolerated first session of iHD  6/30 hypotension / bradycardia overnight, started on dopamine 7/2 Placed on ATC at 40% fiO2  Consults:  Cardiology PCCM  Nephrology  EP  Procedures:  Lt PICC 5/29 >> 6/30 OETT 5/30 >> 6/3; 6/11 > 6/19 - TRACH (Dr Erskine Emery) >> R IJ HD cath 5/30 >> Aline left ulnar 5/30 >> 6/2  Significant Diagnostic Tests:  TTE 5/28 >>The left ventricle has normal systolic function with an ejection fraction of 60-65%. . The right ventricle has normal systolic function. The cavity was mildly enlarged.   Right heart cath 5/29 RA = 24 RV = 94/26 PA = 95/36 (53) PCW = 30 (v = 50) Fick cardiac output/index = 6.0/2.7 PVR =3.9 WU FA sat = 91% PA sat = 58%, 58% SVC 60%  .   Micro Data:  SARS coronavirus 2 cepheid 5/28 >> neg MRSA PCR 5/28 >> neg Trach aspirate 6/2 >> MSSA BCx2 6/2 >> negative ...................... Tracheal aspirate 6/11 >> few GNR >> few proteus and few candida tropicalis BCx2 6/11 >> negative 10/27/2018 tracheal aspirate>> gram-positive cocci gram-negative rods>>  Antimicrobials:  Vancomycin 6/3 > 6/4 Cefazolin 6/4 >> 6/10 ...........................Marland Kitchen Vanco 6/11 >> 6/17 Cefepime 6/11 >>6/18 Cefepime 10/27/2018>>  Interim history/subjective:  Remains a full mechanical ventilatory support.  Objective   Blood pressure (!) 118/49, pulse (!) 54, temperature 97.7 F (36.5 C), temperature source Oral, resp. rate (!) 22, height 5' (1.524 m), weight 104.8 kg, SpO2 92 %.  Vent Mode: PRVC FiO2 (%):  [40 %-60 %] 50 % Set Rate:  [20 bmp] 20 bmp Vt Set:  [360 mL] 360 mL PEEP:  [5 cmH20] 5 cmH20 Plateau Pressure:  [18 cmH20-24 cmH20] 18 cmH20   Intake/Output Summary (Last 24 hours) at 10/28/2018 0321 Last data  filed at 10/28/2018 0700 Gross per 24 hour  Intake 835.73 ml  Output -  Net 835.73 ml   Filed Weights   10/26/18 1545 10/26/18 1935 10/27/18 0600  Weight: 105 kg 103 kg 104.8 kg    General: Morbid obese female currently on full mechanical ventilatory support HEENT: Tracheostomy is unremarkable Neuro: Dull effect with follows commands CV: Heart sounds are distant PULM: Mild rhonchi GI: Obese soft Extremities: warm/dry, 3+ edema  Skin: no rashes or lesions     Assessment & Plan:   Acute on chronic respiratory failure following cardiac arrest Wean as tolerated  Volume removal   Post PEA arrest Supportive care  Hypotension Currently on dopamine and midodrine  Bradycardia - per review of cardiology notes, appears to be baseline.  ? Exacerbated by midodrine EP has been consulted Avoid AV nodal blocking agents  MSSA pneumonia 6/2, Proteus ventilator associated pneumonia 6/11 - s/p abx course.  Purulent secretions on 10/27/2018 with sputum culture sent at that time. Monitor sputum culture Day 1 of cefepime  History of cardiorenal syndrome - now anuric Volume removal per renal  Diabetes CBG (last 3)  Recent Labs    10/27/18 2344 10/28/18 0350 10/28/18 0807  GLUCAP 117* 125* 135*    SSI  Anemia - chronic. Recent Labs    10/27/18 0409 10/28/18 0228  HGB 7.4* 8.6*    Transfuse per protocol  Bleeding from around trach site - resolved Extensive bruising under her breast Hold anticoagulation.   Best practice:  Diet: Continue tube feed Pain/Anxiety/Delirium protocol (if indicated):  Midazolam PRN VAP protocol (if indicated): In place  DVT prophylaxis: None GI prophylaxis: Pepcid Glucose control: SSI Mobility: Bedrest Code Status: Full Disposition: ICU   Richardson Landry Valecia Beske ACNP Maryanna Shape PCCM Pager [phone] till 1 pm If no answer page [phone] 10/28/2018, 8:32 AM

## 2018-10-28 NOTE — Progress Notes (Signed)
[facility] NEPHROLOGY PROGRESS NOTE  Assessment/ Plan: Pt is a 68 y.o. yo female with CHF failed outpatient diuretics therapy underwent RHC on 5/29 with markedly elevated filling pressure, subsequent PEA arrest on 5/30, intubated and ROSC achieved.  She was a started on CRRT for AKI.  #Acute kidney injury likely ATN secondary to shock, anuric: Transitioned to [facility] on 6/27. Using right subclavian TDC.  She is on midodrine for hypotension. -Plan for HD today, still on dopamine per bradycardia and hypotension.  Hopefully we can get some UF. -Dispo: If she remains on vent or trach then she may need LTAC  #CKD stage IV with baseline creatinine around 1.7-1.9 due to diabetes and hypertension.    #Chronic respiratory failure: Status post tracheostomy.  Currently on vent, per pulmonary.  #Chronic diastolic heart failure: Volume optimization during dialysis.  #Anemia due to chronic disease and acute blood loss: Continue ESA, had IV iron 6/28. Monitor cbc.  #Bradycardia, PEA cardiac arrest: On dopamine now.  Seen by EP team, patient is not a candidate for permanent pacemaker EP intervention.   Subjective: Seen and examined at bedside.  No new event.  Still on vent and dopamine.  Responding to question minimally.  Review of system limited. Objective Vital signs in last 24 hours: Vitals:   10/28/18 0830 10/28/18 0842 10/28/18 0900 10/28/18 0912  BP: (!) 117/53  (!) 105/48   Pulse: (!) 55  (!) 55   Resp: (!) 21  (!) 25   Temp:  98.5 F (36.9 C)    TempSrc:  Oral    SpO2: 94%  91% (!) 89%  Weight:      Height:       Weight change:   Intake/Output Summary (Last 24 hours) at 10/28/2018 0936 Last data filed at 10/28/2018 0900 Gross per 24 hour  Intake 829.09 ml  Output -  Net 829.09 ml       Labs: Basic Metabolic Panel: Recent Labs  Lab 10/26/18 0402 10/27/18 0409 10/28/18 0228  NA 133* 134* 132*  K 4.5 4.1 4.4  CL 98 96* 94*  CO2 _0 GLUCOSE 262* 220* 140*   BUN 69* 37* 55*  CREATININE 4.13* 2.90* 4.04*  CALCIUM 8.4* 8.4* 8.8*  PHOS 2.6 2.2* 3.2   Liver Function Tests: Recent Labs  Lab 10/26/18 0402 10/27/18 0409 10/28/18 0228  ALBUMIN 1.7* 2.3* 2.3*   No results for input(s): LIPASE, AMYLASE in the last 168 hours. No results for input(s): AMMONIA in the last 168 hours. CBC: Recent Labs  Lab 10/24/18 0629  10/25/18 0524 10/26/18 0402 10/27/18 0409 10/28/18 0228  WBC 13.2*  --  11.6* 13.7* 11.3* 11.2*  HGB 8.8*   < > 7.6* 7.7* 7.4* 8.6*  HCT 30.3*   < > 26.2* 26.5* 25.6* 28.9*  MCV 95.0  --  94.6 95.0 95.9 95.7  PLT 144*  --  126* 117* 99* 108*   < > = values in this interval not displayed.   Cardiac Enzymes: No results for input(s): CKTOTAL, CKMB, CKMBINDEX, TROPONINI in the last 168 hours. CBG: Recent Labs  Lab 10/27/18 1547 10/27/18 1951 10/27/18 2344 10/28/18 0350 10/28/18 0807  GLUCAP 184* 114* 117* 125* 135*    Iron Studies: No results for input(s): IRON, TIBC, TRANSFERRIN, FERRITIN in the last 72 hours. Studies/Results: No results found.  Medications: Infusions: . sodium chloride Stopped (10/20/18 1934)  . sodium chloride    . sodium chloride    . sodium chloride    .  sodium chloride    . ceFEPime (MAXIPIME) IV Stopped (10/27/18 1846)  . DOPamine 8 mcg/kg/min (10/28/18 0900)  . famotidine (PEPCID) IV Stopped (10/27/18 1526)  . feeding supplement (VITAL 1.5 CAL) 1,000 mL (10/28/18 0845)    Scheduled Medications: . aspirin  81 mg Per Tube Daily  . atorvastatin  40 mg Oral q1800  . chlorhexidine gluconate (MEDLINE KIT)  15 mL Mouth Rinse BID  . Chlorhexidine Gluconate Cloth  6 each Topical Daily  . Chlorhexidine Gluconate Cloth  6 each Topical Q0600  . Chlorhexidine Gluconate Cloth  6 each Topical Q0600  . darbepoetin (ARANESP) injection - DIALYSIS  100 mcg Intravenous Q Wed-HD  . feeding supplement (PRO-STAT SUGAR FREE 64)  30 mL Per Tube TID  . heparin      . insulin aspart  0-20 Units  Subcutaneous Q4H  . insulin detemir  40 Units Subcutaneous BID  . mouth rinse  15 mL Mouth Rinse 10 times per day  . midodrine  15 mg Per Tube TID WC  . polyethylene glycol  17 g Oral Daily    have reviewed scheduled and prn medications.  Physical Exam: General: On vent with trach collar, alert, awake. Neck: Tracheostomy site looks clean with no bleeding Heart:RRR, s1s2 nl, no rubs Lungs: coarse mechanical breath sound, no wheezing Abdomen:soft, Non-tender, non-distended Extremities: No LE edema. Dialysis Access: Right subclavian TDC.   Prasad  10/28/2018,9:36 AM  LOS: 36 days  Pager: [phone]

## 2018-10-29 ENCOUNTER — Inpatient Hospital Stay (HOSPITAL_COMMUNITY): Payer: Medicare HMO

## 2018-10-29 ENCOUNTER — Inpatient Hospital Stay: Payer: Self-pay

## 2018-10-29 LAB — CBC
HCT: 28.5 % — ABNORMAL LOW (ref 36.0–46.0)
Hemoglobin: 8.4 g/dL — ABNORMAL LOW (ref 12.0–15.0)
MCH: 28.2 pg (ref 26.0–34.0)
MCHC: 29.5 g/dL — ABNORMAL LOW (ref 30.0–36.0)
MCV: 95.6 fL (ref 80.0–100.0)
Platelets: 102 10*3/uL — ABNORMAL LOW (ref 150–400)
RBC: 2.98 MIL/uL — ABNORMAL LOW (ref 3.87–5.11)
RDW: 29.2 % — ABNORMAL HIGH (ref 11.5–15.5)
WBC: 6.7 10*3/uL (ref 4.0–10.5)
nRBC: 0.4 % — ABNORMAL HIGH (ref 0.0–0.2)

## 2018-10-29 LAB — GLUCOSE, CAPILLARY
Glucose-Capillary: 217 mg/dL — ABNORMAL HIGH (ref 70–99)
Glucose-Capillary: 262 mg/dL — ABNORMAL HIGH (ref 70–99)
Glucose-Capillary: 168 mg/dL — ABNORMAL HIGH (ref 70–99)
Glucose-Capillary: 183 mg/dL — ABNORMAL HIGH (ref 70–99)
Glucose-Capillary: 171 mg/dL — ABNORMAL HIGH (ref 70–99)

## 2018-10-29 LAB — RENAL FUNCTION PANEL
Albumin: 2.7 g/dL — ABNORMAL LOW (ref 3.5–5.0)
Anion gap: 10 (ref 5–15)
BUN: 45 mg/dL — ABNORMAL HIGH (ref 8–23)
CO2: 25 mmol/L (ref 22–32)
Calcium: 8.7 mg/dL — ABNORMAL LOW (ref 8.9–10.3)
Chloride: 97 mmol/L — ABNORMAL LOW (ref 98–111)
Creatinine, Ser: 3.03 mg/dL — ABNORMAL HIGH (ref 0.44–1.00)
GFR calc Af Amer: 18 mL/min — ABNORMAL LOW
GFR calc non Af Amer: 15 mL/min — ABNORMAL LOW
Glucose, Bld: 250 mg/dL — ABNORMAL HIGH (ref 70–99)
Phosphorus: 3 mg/dL (ref 2.5–4.6)
Potassium: 3.8 mmol/L (ref 3.5–5.1)
Sodium: 132 mmol/L — ABNORMAL LOW (ref 135–145)

## 2018-10-29 MED ORDER — MIDODRINE HCL 5 MG PO TABS
20.0000 mg | ORAL_TABLET | Freq: Three times a day (TID) | ORAL | Status: DC
  Administered 2018-10-29 – 2018-11-06 (×25): 20 mg
  Filled 2018-10-29 (×24): qty 4

## 2018-10-29 MED ORDER — SODIUM CHLORIDE 0.9% FLUSH: 10.0000 mL | INTRAVENOUS | Status: DC | PRN

## 2018-10-29 MED ORDER — FLUDROCORTISONE ACETATE 0.1 MG PO TABS
0.2000 mg | ORAL_TABLET | Freq: Every day | ORAL | Status: DC
  Administered 2018-10-29 – 2018-11-12 (×12): 0.2 mg via ORAL
  Filled 2018-10-29 (×16): qty 2

## 2018-10-29 MED ORDER — ATORVASTATIN CALCIUM 40 MG PO TABS
40.0000 mg | ORAL_TABLET | Freq: Every day | ORAL | Status: DC
  Administered 2018-10-29 – 2018-11-14 (×15): 40 mg
  Filled 2018-10-29 (×15): qty 1

## 2018-10-29 MED ORDER — CHLORHEXIDINE GLUCONATE CLOTH 2 % EX PADS
6.0000 | MEDICATED_PAD | Freq: Every day | CUTANEOUS | Status: DC
  Administered 2018-11-01 – 2018-11-14 (×12): 6 via TOPICAL

## 2018-10-29 MED ORDER — SODIUM CHLORIDE 0.9% FLUSH
10.0000 mL | Freq: Two times a day (BID) | INTRAVENOUS | Status: DC
  Administered 2018-10-29 – 2018-10-31 (×2): 10 mL
  Administered 2018-11-01: 10:00:00 20 mL
  Administered 2018-11-02 – 2018-11-17 (×23): 10 mL

## 2018-10-29 MED ORDER — CHLORHEXIDINE GLUCONATE 0.12 % MT SOLN
OROMUCOSAL | Status: AC
  Administered 2018-10-29: 20:00:00 15 mL via OROMUCOSAL
  Filled 2018-10-29: qty 15

## 2018-10-29 NOTE — Progress Notes (Signed)
[facility] NEPHROLOGY PROGRESS NOTE  Assessment/ Plan: Pt is a 68 y.o. yo female with CHF failed outpatient diuretics therapy underwent RHC on 5/29 with markedly elevated filling pressure, subsequent PEA arrest on 5/30, intubated and ROSC achieved.  She was a started on CRRT for AKI.  #Acute kidney injury likely ATN secondary to shock, anuric: Transitioned to iHD on 6/27. Using right subclavian TDC.  She is on midodrine for hypotension. -Status post HD yesterday with 2 L UF.  Still on dopamine for bradycardia.  Heart rate in 50s.  No plan for HD today. -Dispo: If she remains on vent or trach then she may need LTAC  #CKD stage IV with baseline creatinine around 1.7-1.9 due to diabetes and hypertension.    #Chronic respiratory failure: Status post tracheostomy.  Currently on vent, per pulmonary.  #Chronic diastolic heart failure: Volume optimization during dialysis.  #Anemia due to chronic disease and acute blood loss: Continue ESA, had IV iron 6/28. Monitor cbc.  #Bradycardia, PEA cardiac arrest: On dopamine now.  Seen by EP team, patient is not a candidate for permanent pacemaker EP intervention.   Subjective: Seen and examined at bedside.  On trach and vent.  She is alert awake and more responsive today. Objective Vital signs in last 24 hours: Vitals:   10/29/18 0700 10/29/18 0730 10/29/18 0752 10/29/18 0800  BP: 107/81 (!) 104/49  (!) 121/91  Pulse: (!) 57 (!) 55  (!) 54  Resp: (!) 26 17  18  Temp:      TempSrc:      SpO2: 97% 93% 97% 96%  Weight:      Height:       Weight change:   Intake/Output Summary (Last 24 hours) at 10/29/2018 0837 Last data filed at 10/29/2018 0400 Gross per 24 hour  Intake 1332.02 ml  Output 2000 ml  Net -667.98 ml       Labs: Basic Metabolic Panel: Recent Labs  Lab 10/27/18 0409 10/28/18 0228 10/29/18 0247  NA 134* 132* 132*  K 4.1 4.4 3.8  CL 96* 94* 97*  CO2 26 25 25  GLUCOSE 220* 140* 250*  BUN 37* 55* 45*   CREATININE 2.90* 4.04* 3.03*  CALCIUM 8.4* 8.8* 8.7*  PHOS 2.2* 3.2 3.0   Liver Function Tests: Recent Labs  Lab 10/27/18 0409 10/28/18 0228 10/29/18 0247  ALBUMIN 2.3* 2.3* 2.7*   No results for input(s): LIPASE, AMYLASE in the last 168 hours. No results for input(s): AMMONIA in the last 168 hours. CBC: Recent Labs  Lab 10/25/18 0524 10/26/18 0402 10/27/18 0409 10/28/18 0228 10/29/18 0247  WBC 11.6* 13.7* 11.3* 11.2* 6.7  HGB 7.6* 7.7* 7.4* 8.6* 8.4*  HCT 26.2* 26.5* 25.6* 28.9* 28.5*  MCV 94.6 95.0 95.9 95.7 95.6  PLT 126* 117* 99* 108* 102*   Cardiac Enzymes: No results for input(s): CKTOTAL, CKMB, CKMBINDEX, TROPONINI in the last 168 hours. CBG: Recent Labs  Lab 10/28/18 1527 10/28/18 1942 10/28/18 2335 10/29/18 0337 10/29/18 0646  GLUCAP 175* 248* 243* 217* 262*    Iron Studies: No results for input(s): IRON, TIBC, TRANSFERRIN, FERRITIN in the last 72 hours. Studies/Results: No results found.  Medications: Infusions: . sodium chloride 250 mL (10/28/18 1540)  . sodium chloride    . sodium chloride    . sodium chloride    . sodium chloride    . ceFEPime (MAXIPIME) IV Stopped (10/28/18 1820)  . DOPamine 8 mcg/kg/min (10/29/18 0400)  . famotidine (PEPCID) IV Stopped (10/28/18 1611)  .   feeding supplement (VITAL 1.5 CAL) 45 mL/hr at 10/28/18 2100    Scheduled Medications: . aspirin  81 mg Per Tube Daily  . atorvastatin  40 mg Oral q1800  . chlorhexidine gluconate (MEDLINE KIT)  15 mL Mouth Rinse BID  . Chlorhexidine Gluconate Cloth  6 each Topical Daily  . Chlorhexidine Gluconate Cloth  6 each Topical Q0600  . Chlorhexidine Gluconate Cloth  6 each Topical Q0600  . darbepoetin (ARANESP) injection - DIALYSIS  100 mcg Intravenous Q Wed-HD  . feeding supplement (PRO-STAT SUGAR FREE 64)  30 mL Per Tube TID  . insulin aspart  0-20 Units Subcutaneous Q4H  . insulin detemir  40 Units Subcutaneous BID  . mouth rinse  15 mL Mouth Rinse 10 times per day  .  midodrine  15 mg Per Tube TID WC  . polyethylene glycol  17 g Oral Daily    have reviewed scheduled and prn medications.  Physical Exam: General: Alert awake and more responsive, on vent Neck: Tracheostomy site looks clean with no bleeding Heart:RRR, s1s2 nl, no rubs Lungs: coarse mechanical breath sound, no wheezing Abdomen:soft, Non-tender, non-distended Extremities: No upper or lower extremity edema Dialysis Access: Right subclavian TDC.  Pascal Stiggers Prasad Imogine Carvell 10/29/2018,8:37 AM  LOS: 37 days  Pager: [phone]

## 2018-10-29 NOTE — Progress Notes (Signed)
Spoke with Marcene Brawn, RN, plan to place PICC this afternoon.

## 2018-10-29 NOTE — Plan of Care (Signed)
Ms. Dressel is thinking about what her own goals of care are. Since she is A/O X 4, she needs to make her own decisions while she can.

## 2018-10-29 NOTE — Progress Notes (Addendum)
NAME:  Kathleen [name], MRN:  [id number], DOB:  1951/04/27, LOS: 21 ADMISSION DATE:  09/22/2018, CONSULTATION DATE: 09/24/2018 REFERRING MD: Candee Furbish MD, CHIEF COMPLAINT: Cardiac arrest  Brief History   69 y/o obese woman with chronic diastolic heart failure who failed outpatient diuretics management due to chronic kidney disease.  Right heart cath on 5/29 showed markedly elevated biventricular filling pressures with normal cardiac output.  She was on cardiology service, maintained on milrinone and Lasix.  On the morning of 5/30 she went into PEA arrest, CPR performed for 8 minutes.  Intubated and transferred to ICU.  Started on CRRT 5/31.  Extubated 6/3.  Re-intubated early am 6/11 for hypercarbic respiratory failure and underwent tracheostomy on 6/19  Past Medical History   has a past medical history of Atrial flutter ([place]), Bradycardia, Chronic diastolic (congestive) heart failure ([place]) (01/10/2015), CKD (chronic kidney disease), stage IV ([place]), Degenerative joint disease of hand, Diabetes mellitus, Dyslipidemia, Fecal occult blood test positive, GERD (gastroesophageal reflux disease), Headache, Hypertension, Inadequate material resources, Irritable bowel syndrome, Morbid obesity ([place]), Obesity hypoventilation syndrome ([place]), Post-menopausal bleeding, and Shortness of breath dyspnea.  Significant Hospital Events   5/28 Admit 5/29 RHC 5/30 PEA arrest, intubated/ CRRT 5/31 Milrinone stopped due to ectopy; brady episode- amio stopped 6/02 Attempt at SBT, chest x-ray with worsening pulmonary edema, CVVHD for volume removal 6/03 Extubated > remain on CRRT.  Refused nocturnal BiPAP 6/04 Refused nocturnal BiPAP 6/10 off CRRT 6/11 Intubated early am with hypercarbia, concern for RUL PNA; abx broadened; restarted on CRRT 6/16  No events overnight, weaning on high PS 6/17  failed vent wean, remains on levophed, midodrine and CRRT 6/18  Weaned for 5 hours on PSV 8/5 before requiring full support;  ongoing CRRT, remains on levophed 14 mcg 6/19 - TRACH + Dr Erskine Emery 6/20 -. On IV heparin gt, levophed gtt with midodrine. Off fent gtt. Pressopr needs down 6/26 - TCT tolerated for the past 24 hours  6/27 - Stable on TCT now >48hrs, tolerated first session of iHD  6/30 hypotension / bradycardia overnight, started on dopamine 7/2 Placed on ATC at 40% fiO2  Consults:  Cardiology PCCM  Nephrology  EP  Procedures:  Lt PICC 5/29 >> 6/30 OETT 5/30 >> 6/3; 6/11 > 6/19 - TRACH (Dr Erskine Emery) >> R IJ HD cath 5/30 >> Aline left ulnar 5/30 >> 6/2  Significant Diagnostic Tests:  TTE 5/28 >>The left ventricle has normal systolic function with an ejection fraction of 60-65%. . The right ventricle has normal systolic function. The cavity was mildly enlarged.   Right heart cath 5/29 RA = 24 RV = 94/26 PA = 95/36 (53) PCW = 30 (v = 50) Fick cardiac output/index = 6.0/2.7 PVR =3.9 WU FA sat = 91% PA sat = 58%, 58% SVC 60%  .   Micro Data:  SARS coronavirus 2 cepheid 5/28 >> neg MRSA PCR 5/28 >> neg Trach aspirate 6/2 >> MSSA BCx2 6/2 >> negative ...................... Tracheal aspirate 6/11 >> few GNR >> few proteus and few candida tropicalis BCx2 6/11 >> negative 10/27/2018 tracheal aspirate>> gram-positive cocci gram-negative rods>>  Antimicrobials:  Vancomycin 6/3 > 6/4 Cefazolin 6/4 >> 6/10 ...........................Marland Kitchen Vanco 6/11 >> 6/17 Cefepime 6/11 >>6/18 Cefepime 10/27/2018>>  Interim history/subjective:  No events, still requiring dopamine.  Objective   Blood pressure (!) 121/91, pulse (!) 54, temperature 98.1 F (36.7 C), temperature source Axillary, resp. rate 18, height 5' (1.524 m), weight 104.8 kg, SpO2 96 %.  Vent Mode: CPAP;PSV FiO2 (%):  [40 %] 40 % Set Rate:  [20 bmp] 20 bmp Vt Set:  [360 mL] 360 mL PEEP:  [5 cmH20] 5 cmH20 Pressure Support:  [12 cmH20] 12 cmH20 Plateau Pressure:  [17 cmH20-22 cmH20] 18 cmH20   Intake/Output Summary (Last 24 hours)  at 10/29/2018 6073 Last data filed at 10/29/2018 0400 Gross per 24 hour  Intake 1332.02 ml  Output 2000 ml  Net -667.98 ml   Filed Weights   10/27/18 0600 10/28/18 0950 10/28/18 1300  Weight: 104.8 kg 106.1 kg 104.8 kg    GEN: pleasant elderly woman on vent HEENT: trach in place, minimal secretions CV: bradycardic, ext warm PULM: Clear, no accessory muscle use GI: soft, +BS EXT: minimal edema NEURO: moves all 4 ext briskly to command PSYCH: nodding to questions appropriately SKIN: no rashes       Assessment & Plan:  # Persistent shock after PEA arrest # HD dependent renal failure # Persistent resp failure now trach'd # Likely some anoxic encephalopathy # Lack of central access # Inappropriate chronotropic response but not pacemaker candidate.  I tried her off dopamine today, HR remained unchanged but BP dropped to 50/20 with dizziness indicating vasoplegia may be primary driver of her BP issues.  Latest echo preserved.  Will talk to cardiology about whether a trial of TVP would be appropriate.  - On midodrine, increase to 20 but doubt this will make much of difference, add trial of florinef - On peripheral dopamine - RN to reach out to PICC team to see if they are willing to do PICC, otherwise will have to reach out to IR for Hohn catheter or similar - Needs a PEG at some point - Going to reach out to family today and discuss disposition issues, not sure they quite grasp everything going on - Continue vent at night, TC trials during day  Best practice:  Diet: Continue tube feed Pain/Anxiety/Delirium protocol (if indicated): NA VAP protocol (if indicated): In place  DVT prophylaxis: None GI prophylaxis: Pepcid Glucose control: SSI Mobility: Bedrest Code Status: Full Disposition: ICU    The patient is critically ill with multiple organ systems failure and requires high complexity decision making for assessment and support, frequent evaluation and titration of therapies,  application of advanced monitoring technologies and extensive interpretation of multiple databases. Critical Care Time devoted to patient care services described in this note independent of APP/resident time (if applicable)  is 34 minutes.   Erskine Emery MD [facility] 10/29/18 9:16 AM Personal pager: [phone] If unanswered, please page CCM On-call: [phone]  Update: spoke to son, does not sound like he is ready to transition to comfort care at this time.  I explained we are running out of options, he is open to establishing dialogue with palliative care going forward.

## 2018-10-29 NOTE — Progress Notes (Signed)
Patient ID: Kathleen [name], female   DOB: January 25, 1951, 68 y.o.   MRN: [id number]    Advanced Heart Failure Rounding Note   Subjective:     Asked by CCM to consider TVP to help alleviate refractory hypotension.   Awake on vent through trach. Remains anuric. On iHD but required dopamine up to 9 to maintain pressure. Now on dopamine at 7. When off dopa pressures in 50s   On midodrine 20 tid  Tmax 99.2 On cefipime for proteus in sputum  Remains in slow AF with rates 40-50s. Has been see by EP and determined not to be a candidate for pacing.    Objective:   Weight Range:  Vital Signs:   Temp:  [98.1 F (36.7 C)-99.2 F (37.3 C)] 98.6 F (37 C) (07/04 0800) Pulse Rate:  [49-61] 50 (07/04 1030) Resp:  [14-30] 17 (07/04 1030) BP: (46-136)/(22-103) 123/51 (07/04 1030) SpO2:  [90 %-97 %] 95 % (07/04 1130) FiO2 (%):  [40 %] 40 % (07/04 1143) Weight:  [104.8 kg] 104.8 kg (07/03 1300) Last BM Date: 10/28/18  Weight change: Filed Weights   10/27/18 0600 10/28/18 0950 10/28/18 1300  Weight: 104.8 kg 106.1 kg 104.8 kg    Intake/Output:   Intake/Output Summary (Last 24 hours) at 10/29/2018 1225 Last data filed at 10/29/2018 0900 Gross per 24 hour  Intake 1408.19 ml  Output 2000 ml  Net -591.81 ml     Physical Exam: General:  Lying in bed. Awake on vent  HEENT: normal Neck: supple.  + trach no obvious JVD. Carotids 2+ bilat; no bruits. No lymphadenopathy or thryomegaly appreciated. Cor: PMI nondisplaced. Irregular. brady Lungs: clear Abdomen: obese soft, nontender, nondistended. No hepatosplenomegaly. No bruits or masses. Good bowel sounds. Extremities: no cyanosis, clubbing, rash, tr edema + boots Neuro: alert & orientedx3, cranial nerves grossly intact. moves all 4 extremities w/o difficulty. Affect pleasant   Telemetry: AF 40-50s at times very regular ? junctional. Personally reviewed   Labs: Basic Metabolic Panel: Recent Labs  Lab 10/24/18 0337  10/25/18 0524 10/26/18  0402 10/27/18 0409 10/28/18 0228 10/29/18 0247  NA 131*   < > 134* 133* 134* 132* 132*  K 4.7   < > 4.5 4.5 4.1 4.4 3.8  CL 97*  --  99 98 96* 94* 97*  CO2 25  --  _0 GLUCOSE 186*  --  165* 262* 220* 140* 250*  BUN 72*  --  46* 69* 37* 55* 45*  CREATININE 3.89*  --  2.99* 4.13* 2.90* 4.04* 3.03*  CALCIUM 8.6*  --  8.3* 8.4* 8.4* 8.8* 8.7*  MG 2.5*  --   --  2.5* 2.2 2.5*  --   PHOS 4.2  --  2.2* 2.6 2.2* 3.2 3.0   < > = values in this interval not displayed.    Liver Function Tests: Recent Labs  Lab 10/25/18 0524 10/26/18 0402 10/27/18 0409 10/28/18 0228 10/29/18 0247  ALBUMIN 1.8* 1.7* 2.3* 2.3* 2.7*   No results for input(s): LIPASE, AMYLASE in the last 168 hours. No results for input(s): AMMONIA in the last 168 hours.  CBC: Recent Labs  Lab 10/25/18 0524 10/26/18 0402 10/27/18 0409 10/28/18 0228 10/29/18 0247  WBC 11.6* 13.7* 11.3* 11.2* 6.7  HGB 7.6* 7.7* 7.4* 8.6* 8.4*  HCT 26.2* 26.5* 25.6* 28.9* 28.5*  MCV 94.6 95.0 95.9 95.7 95.6  PLT 126* 117* 99* 108* 102*    Cardiac Enzymes: No results for input(s): CKTOTAL, CKMB,  CKMBINDEX, TROPONINI in the last 168 hours.  BNP: BNP (last 3 results) Recent Labs    09/22/18 1420  BNP 451.2*    ProBNP (last 3 results) No results for input(s): PROBNP in the last 8760 hours.    Other results:  Imaging: [place] Ekg Site Rite  Result Date: 10/29/2018 If Site Rite image not attached, placement could not be confirmed due to current cardiac rhythm.    Medications:     Scheduled Medications: . aspirin  81 mg Per Tube Daily  . atorvastatin  40 mg Oral q1800  . chlorhexidine gluconate (MEDLINE KIT)  15 mL Mouth Rinse BID  . Chlorhexidine Gluconate Cloth  6 each Topical Daily  . Chlorhexidine Gluconate Cloth  6 each Topical Q0600  . darbepoetin (ARANESP) injection - DIALYSIS  100 mcg Intravenous Q Wed-HD  . feeding supplement (PRO-STAT SUGAR FREE 64)  30 mL Per Tube TID  . fludrocortisone  0.2 mg  Oral Daily  . insulin aspart  0-20 Units Subcutaneous Q4H  . insulin detemir  40 Units Subcutaneous BID  . mouth rinse  15 mL Mouth Rinse 10 times per day  . midodrine  20 mg Per Tube TID WC  . polyethylene glycol  17 g Oral Daily    Infusions: . sodium chloride 250 mL (10/28/18 1540)  . sodium chloride    . sodium chloride    . sodium chloride    . sodium chloride    . ceFEPime (MAXIPIME) IV Stopped (10/28/18 1820)  . DOPamine Stopped (10/29/18 0850)  . famotidine (PEPCID) IV Stopped (10/28/18 1611)  . feeding supplement (VITAL 1.5 CAL) Stopped (10/29/18 0845)    PRN Medications: sodium chloride, sodium chloride, sodium chloride, sodium chloride, sodium chloride, acetaminophen (TYLENOL) oral liquid 160 mg/5 mL, alteplase, alteplase, bisacodyl, docusate, heparin, heparin, heparin, lidocaine (PF), lidocaine-prilocaine, midazolam, ondansetron, pentafluoroprop-tetrafluoroeth   Assessment/Plan:   1. PEA arrest on 5/30 - due to hypoxia/pulmonary edema. - now stable  2. Acute on chronic hypoxic respiratory failure - on vent through trach - on cefipime for proteus in sputum  3.  Acute on chronic diastolic HF - Admitted with markedly elevated filling pressures complicated by severe cardiorenal syndrome - Now down 80+ pounds with CVVHD. - Volume management per Renal and iHD  4. AKI on CKD 3 - baseline creatinine 1.9 -> ESRD - Anuric. Remains on iHD - Therapy limited by profound hypotension requiring dopamine  5. Chronic AF/AFL with slow VR and symptomatic bradycardia - Has been off AC due to bleeding. Will need to consider adding back Eliquis - EP has seen and determined not to be a PPM candidate fue to multiple comorbidities and high infection risk - CCM wants to consider TVP to see if increasing HR will help with hypotension otherwise her only option may be palliative care which she and her family aren't interested in currently. I have d/w Dr. Lovena Le. Certainly can consider  this approach but we run the risk of making her pacemaker dependent and if she is not candidate for PPM then would not proceed down this road. If pacing fixes hypotension (I suspect this is unlikely) Dr. Lovena Le has mention possibility of RV Micro device if Dr. Rayann Heman feels she would be candidate. I will d/w Dr. Rayann Heman   6. VT/NSVT - Quiescent.  off amio due to bradycardia - Keep K> 4.0 Mg 2.0   7. Hypotension - Persists - Now on midodrine 20  tid. Add florinef - see PM discussion above   CRITICAL CARE  Performed by: Glori Bickers  Total critical care time: 35 minutes  Critical care time was exclusive of separately billable procedures and treating other patients.  Critical care was necessary to treat or prevent imminent or life-threatening deterioration.  Critical care was time spent personally by me (independent of midlevel providers or residents) on the following activities: development of treatment plan with patient and/or surrogate as well as nursing, discussions with consultants, evaluation of patient's response to treatment, examination of patient, obtaining history from patient or surrogate, ordering and performing treatments and interventions, ordering and review of laboratory studies, ordering and review of radiographic studies, pulse oximetry and re-evaluation of patient's condition.   Length of Stay: 37  Glori Bickers MD 10/29/2018, 12:25 PM  Advanced Heart Failure Team Pager [phone] (M-F; 7a - 4p)  Please contact [facility] for night-coverage after hours (4p -7a ) and weekends on [email or web address]

## 2018-10-29 NOTE — Progress Notes (Signed)
This RN spoke with the patient regarding her current situation and the need to decide where to go from here. Asked if she was okay to have the PICC line placed today. I also explained that she'd need to decide if she wanted a peg tube for medications and nutrition, also talked about placing a temporary pacemaker today, and how she would feel about being alive with the ventilator, etc. Kathleen [name] is trached and on the ventilator, but able to write on the whiteboard and communicate appropriately. I fully believe she is alert and oriented and able to make decisions. I encouraged her to make whatever decisions she could, when she could and she agreed. I offered to video chat with her son to help make decisions and she said she'd just like to think more about things alone for now. She said she wasn't ready to make a decision about the temporary pacemaker yet. Palliative care has been consulted by CCM and plans to come speak with her tomorrow. She was tearful at times, but also expressed appreciation for being honest.  Her son called to check up on her and I updated him on our conversation. He seemed relieved that she could make decisions for herself. I explained to both the patient and the son that if she decided to be comfort care only than her family could come be with her. Will continue to follow closely and provide emotional support.

## 2018-10-29 NOTE — Progress Notes (Signed)
Dopamine drip turned off per Dr.Smith. Patient's BP dropped into the 40's/20's and she eventually became sleepy and dizzy. Once she became symptomatic, Dr.Smith ordered the drip to be restarted.

## 2018-10-29 NOTE — Progress Notes (Signed)
Peripherally Inserted Central Catheter/Midline Placement  The IV Nurse has discussed with the patient and/or persons authorized to consent for the patient, the purpose of this procedure and the potential benefits and risks involved with this procedure.  The benefits include less needle sticks, lab draws from the catheter, and the patient may be discharged home with the catheter. Risks include, but not limited to, infection, bleeding, blood clot (thrombus formation), and puncture of an artery; nerve damage and irregular heartbeat and possibility to perform a PICC exchange if needed/ordered by physician.  Alternatives to this procedure were also discussed.  Bard Power PICC patient education guide, fact sheet on infection prevention and patient information card has been provided to patient /or left at bedside.    PICC/Midline Placement Documentation  PICC Double Lumen 75/79/72 PICC Right Basilic 43 cm (Active)  Indication for Insertion or Continuance of Line Vasoactive infusions 10/29/18 1500  Site Assessment Clean;Dry;Intact 10/29/18 1500  Lumen #1 Status Blood return noted;Flushed;Saline locked 10/29/18 1500  Lumen #2 Status Flushed;Blood return noted;Saline locked 10/29/18 1500  Dressing Type Transparent 10/29/18 1500  Dressing Status Clean;Dry;Intact;Antimicrobial disc in place 10/29/18 [place] checked and tightened 10/29/18 1500  Dressing Intervention New dressing 10/29/18 1500  Dressing Change Due 11/05/18 10/29/18 1500       Lorenza Cambridge 10/29/2018, 3:31 PM

## 2018-10-30 LAB — RENAL FUNCTION PANEL
Albumin: 2.3 g/dL — ABNORMAL LOW (ref 3.5–5.0)
Anion gap: 14 (ref 5–15)
BUN: 70 mg/dL — ABNORMAL HIGH (ref 8–23)
CO2: 22 mmol/L (ref 22–32)
Calcium: 8.6 mg/dL — ABNORMAL LOW (ref 8.9–10.3)
Chloride: 95 mmol/L — ABNORMAL LOW (ref 98–111)
Creatinine, Ser: 4.06 mg/dL — ABNORMAL HIGH (ref 0.44–1.00)
GFR calc Af Amer: 12 mL/min — ABNORMAL LOW (ref >60)
GFR calc non Af Amer: 11 mL/min — ABNORMAL LOW (ref >60)
Glucose, Bld: 182 mg/dL — ABNORMAL HIGH (ref 70–99)
Phosphorus: 4 mg/dL (ref 2.5–4.6)
Potassium: 3.7 mmol/L (ref 3.5–5.1)
Sodium: 131 mmol/L — ABNORMAL LOW (ref 135–145)

## 2018-10-30 LAB — CULTURE, RESPIRATORY W GRAM STAIN

## 2018-10-30 LAB — GLUCOSE, CAPILLARY
Glucose-Capillary: 153 mg/dL — ABNORMAL HIGH (ref 70–99)
Glucose-Capillary: 158 mg/dL — ABNORMAL HIGH (ref 70–99)
Glucose-Capillary: 162 mg/dL — ABNORMAL HIGH (ref 70–99)
Glucose-Capillary: 173 mg/dL — ABNORMAL HIGH (ref 70–99)
Glucose-Capillary: 174 mg/dL — ABNORMAL HIGH (ref 70–99)
Glucose-Capillary: 188 mg/dL — ABNORMAL HIGH (ref 70–99)
Glucose-Capillary: 206 mg/dL — ABNORMAL HIGH (ref 70–99)

## 2018-10-30 LAB — CBC
HCT: 28.4 % — ABNORMAL LOW (ref 36.0–46.0)
Hemoglobin: 8.2 g/dL — ABNORMAL LOW (ref 12.0–15.0)
MCH: 27.7 pg (ref 26.0–34.0)
MCHC: 28.9 g/dL — ABNORMAL LOW (ref 30.0–36.0)
MCV: 95.9 fL (ref 80.0–100.0)
Platelets: 97 10*3/uL — ABNORMAL LOW (ref 150–400)
RBC: 2.96 MIL/uL — ABNORMAL LOW (ref 3.87–5.11)
RDW: 28.7 % — ABNORMAL HIGH (ref 11.5–15.5)
WBC: 7.3 10*3/uL (ref 4.0–10.5)
nRBC: 0.3 % — ABNORMAL HIGH (ref 0.0–0.2)

## 2018-10-30 LAB — PROCALCITONIN: Procalcitonin: <0.1 ng/mL

## 2018-10-30 MED ORDER — SODIUM CHLORIDE 0.9 % IV SOLN
125.0000 mg | INTRAVENOUS | Status: AC
  Administered 2018-10-31 – 2018-11-09 (×5): 125 mg via INTRAVENOUS
  Filled 2018-10-30 (×9): qty 10

## 2018-10-30 MED ORDER — GUAIFENESIN 100 MG/5ML PO SOLN
15.0000 mL | Freq: Four times a day (QID) | ORAL | Status: DC
  Administered 2018-10-30 – 2018-11-15 (×54): 300 mg
  Filled 2018-10-30 (×2): qty 15
  Filled 2018-10-30: qty 5
  Filled 2018-10-30 (×12): qty 15
  Filled 2018-10-30: qty 10
  Filled 2018-10-30 (×5): qty 15
  Filled 2018-10-30: qty 45
  Filled 2018-10-30 (×10): qty 15
  Filled 2018-10-30: qty 45
  Filled 2018-10-30 (×12): qty 15
  Filled 2018-10-30: qty 5
  Filled 2018-10-30 (×8): qty 15

## 2018-10-30 MED ORDER — GUAIFENESIN 100 MG/5ML PO SOLN: 15.0000 mL | Freq: Four times a day (QID) | ORAL | Status: DC

## 2018-10-30 MED ORDER — GUAIFENESIN ER 600 MG PO TB12: 600.0000 mg | ORAL_TABLET | Freq: Two times a day (BID) | ORAL | Status: DC

## 2018-10-30 MED ORDER — CHLORHEXIDINE GLUCONATE CLOTH 2 % EX PADS
6.0000 | MEDICATED_PAD | Freq: Every day | CUTANEOUS | Status: DC
  Administered 2018-10-31 – 2018-11-05 (×3): 6 via TOPICAL

## 2018-10-30 NOTE — Progress Notes (Signed)
NAME:  Kathleen [name], MRN:  [id number], DOB:  04/21/1951, LOS: 62 ADMISSION DATE:  09/22/2018, CONSULTATION DATE: 09/24/2018 REFERRING MD: Candee Furbish MD, CHIEF COMPLAINT: Cardiac arrest  Brief History   68 y/o obese woman with chronic diastolic heart failure who failed outpatient diuretics management due to chronic kidney disease.  Right heart cath on 5/29 showed markedly elevated biventricular filling pressures with normal cardiac output.  She was on cardiology service, maintained on milrinone and Lasix.  On the morning of 5/30 she went into PEA arrest, CPR performed for 8 minutes.  Intubated and transferred to ICU.  Started on CRRT 5/31.  Extubated 6/3.  Re-intubated early am 6/11 for hypercarbic respiratory failure and underwent tracheostomy on 6/19  Past Medical History   has a past medical history of Atrial flutter ([place]), Bradycardia, Chronic diastolic (congestive) heart failure ([place]) (01/10/2015), CKD (chronic kidney disease), stage IV ([place]), Degenerative joint disease of hand, Diabetes mellitus, Dyslipidemia, Fecal occult blood test positive, GERD (gastroesophageal reflux disease), Headache, Hypertension, Inadequate material resources, Irritable bowel syndrome, Morbid obesity ([place]), Obesity hypoventilation syndrome ([place]), Post-menopausal bleeding, and Shortness of breath dyspnea.  Significant Hospital Events   5/28 Admit 5/29 RHC 5/30 PEA arrest, intubated/ CRRT 5/31 Milrinone stopped due to ectopy; brady episode- amio stopped 6/02 Attempt at SBT, chest x-ray with worsening pulmonary edema, CVVHD for volume removal 6/03 Extubated > remain on CRRT.  Refused nocturnal BiPAP 6/04 Refused nocturnal BiPAP 6/10 off CRRT 6/11 Intubated early am with hypercarbia, concern for RUL PNA; abx broadened; restarted on CRRT 6/16  No events overnight, weaning on high PS 6/17  failed vent wean, remains on levophed, midodrine and CRRT 6/18  Weaned for 5 hours on PSV 8/5 before requiring full support;  ongoing CRRT, remains on levophed 14 mcg 6/19 - TRACH + Dr Erskine Emery 6/20 -. On IV heparin gt, levophed gtt with midodrine. Off fent gtt. Pressopr needs down 6/26 - TCT tolerated for the past 24 hours  6/27 - Stable on TCT now >48hrs, tolerated first session of iHD  6/30 hypotension / bradycardia overnight, started on dopamine 7/2 Placed on ATC at 40% fiO2  Consults:  Cardiology PCCM  Nephrology  EP  Procedures:  Lt PICC 5/29 >> 6/30 OETT 5/30 >> 6/3; 6/11 > 6/19 - TRACH (Dr Erskine Emery) >> R IJ HD cath 5/30 >> Aline left ulnar 5/30 >> 6/2  Significant Diagnostic Tests:  TTE 5/28 >>The left ventricle has normal systolic function with an ejection fraction of 60-65%. . The right ventricle has normal systolic function. The cavity was mildly enlarged.   Right heart cath 5/29 RA = 24 RV = 94/26 PA = 95/36 (53) PCW = 30 (v = 50) Fick cardiac output/index = 6.0/2.7 PVR =3.9 WU FA sat = 91% PA sat = 58%, 58% SVC 60%  .   Micro Data:  SARS coronavirus 2 cepheid 5/28 >> neg MRSA PCR 5/28 >> neg Trach aspirate 6/2 >> MSSA BCx2 6/2 >> negative ...................... Tracheal aspirate 6/11 >> few GNR >> few proteus and few candida tropicalis BCx2 6/11 >> negative 10/27/2018 tracheal aspirate>> gram-positive cocci gram-negative rods>>  Antimicrobials:  Vancomycin 6/3 > 6/4 Cefazolin 6/4 >> 6/10 ...........................Marland Kitchen Vanco 6/11 >> 6/17 Cefepime 6/11 >>6/18 Cefepime 10/27/2018>>  Interim history/subjective:  No events, still requiring dopamine.  Denies pain or anxiety.  Objective   Blood pressure (!) 111/46, pulse (!) 55, temperature 98.5 F (36.9 C), temperature source Oral, resp. rate (!) 21, height 5' (1.524 m), weight 107.4  kg, SpO2 99 %.    Vent Mode: PRVC FiO2 (%):  [40 %] 40 % Set Rate:  [20 bmp] 20 bmp Vt Set:  [360 mL] 360 mL PEEP:  [5 cmH20] 5 cmH20 Pressure Support:  [10 YFV49-44 cmH20] 12 cmH20 Plateau Pressure:  [13 cmH20-23 cmH20] 16 cmH20    Intake/Output Summary (Last 24 hours) at 10/30/2018 1035 Last data filed at 10/30/2018 1000 Gross per 24 hour  Intake 1670.11 ml  Output -  Net 1670.11 ml   Filed Weights   10/28/18 0950 10/28/18 1300 10/30/18 0500  Weight: 106.1 kg 104.8 kg 107.4 kg    GEN: pleasant elderly woman on vent HEENT: trach in place, minimal secretions, on PS CV: bradycardic, ext warm PULM: Occasional rhonci, no accessory muscle use GI: soft, +BS EXT: minimal edema NEURO: moves all 4 ext briskly to command PSYCH: nodding to questions appropriately SKIN: no rashes       Assessment & Plan:  # Persistent shock after PEA arrest # HD dependent renal failure # Persistent resp failure now trach'd # Proteus colonization vs. bronchitis # Likely some anoxic encephalopathy # Inappropriate chronotropic response but not pacemaker candidate.  I tried her off dopamine today, HR remained unchanged but BP dropped to 50/20 with dizziness indicating vasoplegia may be primary driver of her BP issues.  Latest echo preserved.  Cardiology re-evaluating but does not sound like a pacemaker will add much help here.  - 5 days cefepime fine, I think most likely the proteus is just a colonizer - Continue florinef and midodrine as ordered, keep trying to wean dopamine - Needs a PEG at some point - Palliative consult appreciated, lots of family moral distress at this time - TC trials during day, up in chair  Best practice:  Diet: Continue tube feed Pain/Anxiety/Delirium protocol (if indicated): NA VAP protocol (if indicated): In place  DVT prophylaxis: None GI prophylaxis: Pepcid Glucose control: SSI Mobility: Bedrest Code Status: Full Disposition: ICU    The patient is critically ill with multiple organ systems failure and requires high complexity decision making for assessment and support, frequent evaluation and titration of therapies, application of advanced monitoring technologies and extensive interpretation of  multiple databases. Critical Care Time devoted to patient care services described in this note independent of APP/resident time (if applicable)  is 32 minutes.   Erskine Emery MD [facility] 10/29/18 10:35 AM Personal pager: [phone] If unanswered, please page CCM On-call: [phone]

## 2018-10-30 NOTE — Progress Notes (Signed)
Patient ID: Kathleen [name], female   DOB: [birth year], 68 y.o.   MRN: [id number]    Advanced Heart Failure Rounding Note   Subjective:     Asked by CCM to consider TVP to help alleviate refractory hypotension.   Awake on vent through trach. Follows commands. Denies CP.  Remains anuric. On iHD but required dopamine at 7-9 mcg/kg/minto maintain pressure.   Fevers have resolved.  On midodrine 20 tid. Dopamine 7.  SBP 110-120. On cefipime for proteus in sputum  Remains in slow AF with rates 50-60s.    Objective:   Weight Range:  Vital Signs:   Temp:  [98.1 F (36.7 C)-98.5 F (36.9 C)] 98.5 F (36.9 C) (07/05 0731) Pulse Rate:  [42-62] 55 (07/05 0700) Resp:  [14-30] 20 (07/05 0700) BP: (74-164)/(33-151) 118/70 (07/05 0700) SpO2:  [88 %-100 %] 99 % (07/05 0901) FiO2 (%):  [40 %] 40 % (07/05 0901) Weight:  [107.4 kg] 107.4 kg (07/05 0500) Last BM Date: 10/29/18  Weight change: Filed Weights   10/28/18 0950 10/28/18 1300 10/30/18 0500  Weight: 106.1 kg 104.8 kg 107.4 kg    Intake/Output:   Intake/Output Summary (Last 24 hours) at 10/30/2018 0940 Last data filed at 10/30/2018 0700 Gross per 24 hour  Intake 636.73 ml  Output --  Net 636.73 ml     Physical Exam: General:  Lying in bed. Awake on vent  HEENT: normal Neck: supple. +trach  no obvious JVD. Carotids 2+ bilat; no bruits. No lymphadenopathy or thryomegaly appreciated. Cor: PMI nondisplaced. Regular brady. + perma cath Lungs: clear Abdomen: obese soft, nontender, nondistended. No hepatosplenomegaly. No bruits or masses. Good bowel sounds. Extremities: no cyanosis, clubbing, rash, edema Neuro: alert & orientedx3, cranial nerves grossly intact. moves all 4 extremities w/o difficulty. Affect pleasant   Telemetry: AF 50-60s at times very regular ? junctional. Personally reviewed   Labs: Basic Metabolic Panel: Recent Labs  Lab 10/24/18 0337  10/26/18 0402 10/27/18 0409 10/28/18 0228 10/29/18 0247  10/30/18 0353  NA 131*   < > 133* 134* 132* 132* 131*  K 4.7   < > 4.5 4.1 4.4 3.8 3.7  CL 97*   < > 98 96* 94* 97* 95*  CO2 25   < > _0 GLUCOSE 186*   < > 262* 220* 140* 250* 182*  BUN 72*   < > 69* 37* 55* 45* 70*  CREATININE 3.89*   < > 4.13* 2.90* 4.04* 3.03* 4.06*  CALCIUM 8.6*   < > 8.4* 8.4* 8.8* 8.7* 8.6*  MG 2.5*  --  2.5* 2.2 2.5*  --   --   PHOS 4.2   < > 2.6 2.2* 3.2 3.0 4.0   < > = values in this interval not displayed.    Liver Function Tests: Recent Labs  Lab 10/26/18 0402 10/27/18 0409 10/28/18 0228 10/29/18 0247 10/30/18 0353  ALBUMIN 1.7* 2.3* 2.3* 2.7* 2.3*   No results for input(s): LIPASE, AMYLASE in the last 168 hours. No results for input(s): AMMONIA in the last 168 hours.  CBC: Recent Labs  Lab 10/26/18 0402 10/27/18 0409 10/28/18 0228 10/29/18 0247 10/30/18 0353  WBC 13.7* 11.3* 11.2* 6.7 7.3  HGB 7.7* 7.4* 8.6* 8.4* 8.2*  HCT 26.5* 25.6* 28.9* 28.5* 28.4*  MCV 95.0 95.9 95.7 95.6 95.9  PLT 117* 99* 108* 102* 97*    Cardiac Enzymes: No results for input(s): CKTOTAL, CKMB, CKMBINDEX, TROPONINI in the last 168 hours.  BNP: BNP (  last 3 results) Recent Labs    09/22/18 1420  BNP 451.2*    ProBNP (last 3 results) No results for input(s): PROBNP in the last 8760 hours.    Other results:  Imaging: Dg Chest Port 1 View  Result Date: 10/29/2018 CLINICAL DATA:  PICC placement. EXAM: PORTABLE CHEST 1 VIEW COMPARISON:  Radiograph of October 24, 2018. FINDINGS: Stable cardiomegaly. Tracheostomy and feeding tube are unchanged in position. Right internal jugular dialysis catheter is unchanged in position. Interval placement of right-sided PICC line is noted with distal tip in expected position of the SVC. Mildly improved right lung opacity is noted concerning for edema or pneumonia. Stable left perihilar and basilar opacity is noted concerning for edema or possibly pneumonia. No pneumothorax is noted. No significant pleural effusion  is noted. Bony thorax is unremarkable. IMPRESSION: Interval placement of right-sided PICC line with distal tip in expected position of the SVC. Otherwise stable support apparatus. Slightly improved right lung opacity is noted concerning for edema or pneumonia. Stable left lung opacity as described above. Electronically Signed   By: Marijo Conception M.D.   On: 10/29/2018 16:07   [place] Ekg Site Rite  Result Date: 10/29/2018 If Site Rite image not attached, placement could not be confirmed due to current cardiac rhythm.    Medications:     Scheduled Medications:  aspirin  81 mg Per Tube Daily   atorvastatin  40 mg Per Tube q1800   chlorhexidine gluconate (MEDLINE KIT)  15 mL Mouth Rinse BID   Chlorhexidine Gluconate Cloth  6 each Topical Daily   Chlorhexidine Gluconate Cloth  6 each Topical Daily   darbepoetin (ARANESP) injection - DIALYSIS  100 mcg Intravenous Q Wed-HD   feeding supplement (PRO-STAT SUGAR FREE 64)  30 mL Per Tube TID   fludrocortisone  0.2 mg Oral Daily   insulin aspart  0-20 Units Subcutaneous Q4H   insulin detemir  40 Units Subcutaneous BID   mouth rinse  15 mL Mouth Rinse 10 times per day   midodrine  20 mg Per Tube TID WC   polyethylene glycol  17 g Oral Daily   sodium chloride flush  10-40 mL Intracatheter Q12H    Infusions:  sodium chloride 250 mL (10/28/18 1540)   ceFEPime (MAXIPIME) IV Stopped (10/29/18 1524)   DOPamine 7 mcg/kg/min (10/30/18 0700)   famotidine (PEPCID) IV Stopped (10/29/18 1330)   feeding supplement (VITAL 1.5 CAL) 1,000 mL (10/29/18 1300)    PRN Medications: sodium chloride, acetaminophen (TYLENOL) oral liquid 160 mg/5 mL, alteplase, alteplase, bisacodyl, docusate, heparin, heparin, heparin, lidocaine (PF), lidocaine-prilocaine, midazolam, ondansetron, pentafluoroprop-tetrafluoroeth, sodium chloride flush   Assessment/Plan:   1. PEA arrest on 5/30 - due to hypoxia/pulmonary edema. - now stable  2. Acute on chronic  hypoxic respiratory failure - on vent through trach - on cefipime for proteus in sputum  3.  Acute on chronic diastolic HF - Admitted with markedly elevated filling pressures complicated by severe cardiorenal syndrome - Now down 80+ pounds with CVVHD. - Volume management per Renal and iHD. Volume status looks good.   4. AKI on CKD 3 - baseline creatinine 1.9 -> ESRD - Anuric. Remains on iHD - Therapy limited by profound hypotension requiring dopamine  5. Chronic AF/AFL with slow VR and symptomatic bradycardia - Has been off AC due to bleeding. Will need to consider adding back Eliquis - EP has seen and determined not to be a PPM candidate fue to multiple comorbidities and high infection risk - CCM  wants to consider TVP to see if increasing HR will help with hypotension otherwise her only option may be palliative care which she and her family aren't interested in currently. I have d/w Dr. Lovena Le. Certainly can consider this approach but we run the risk of making her pacemaker dependent and if she is not candidate for PPM then would not proceed down this road. If pacing fixes hypotension (I suspect this is unlikely) Dr. Lovena Le has mention possibility of RV Micro device if Dr. Rayann Heman feels she would be candidate. I have d/w Allred who would consider Micro device later in the week if we are convinced that fixing bradycardia will alleviate hypotension . - Will wean dopamine slowly and see if drop in BP correlates to drop in HR. If so, will consider trial of TVP - D/w CCM  6. VT/NSVT - Quiescent.  off amio due to bradycardia - Keep K> 4.0 Mg 2.0   7. Hypotension - Persists - Now on midodrine 20  Tid and doapmine. Florinef added  - see PM discussion above   CRITICAL CARE Performed by: Glori Bickers  Total critical care time: 35 minutes  Critical care time was exclusive of separately billable procedures and treating other patients.  Critical care was necessary to treat or prevent  imminent or life-threatening deterioration.  Critical care was time spent personally by me (independent of midlevel providers or residents) on the following activities: development of treatment plan with patient and/or surrogate as well as nursing, discussions with consultants, evaluation of patient's response to treatment, examination of patient, obtaining history from patient or surrogate, ordering and performing treatments and interventions, ordering and review of laboratory studies, ordering and review of radiographic studies, pulse oximetry and re-evaluation of patient's condition.   Length of Stay: 70  Glori Bickers MD 10/30/2018, 9:40 AM  Advanced Heart Failure Team Pager [phone] (M-F; [place])  Please contact [facility] for night-coverage after hours (4p -7a ) and weekends on [email or web address]

## 2018-10-30 NOTE — Progress Notes (Signed)
[facility] NEPHROLOGY PROGRESS NOTE  Assessment/ Plan: Pt is a 68 y.o. yo female with CHF failed outpatient diuretics therapy underwent RHC on 5/29 with markedly elevated filling pressure, subsequent PEA arrest on 5/30, intubated and ROSC achieved.  She was a started on CRRT for AKI.  #Acute kidney injury likely ATN secondary to shock, anuric: Transitioned to [facility] on 6/27. Using right subclavian TDC.  She is on midodrine for hypotension. -Plan for HD tomorrow.  She is on high dose of midodrine and dopamine -Dispo: If she remains on vent or trach then she may need LTAC  #CKD stage IV with baseline creatinine around 1.7-1.9 due to diabetes and hypertension.    #Chronic respiratory failure: Status post tracheostomy.  Currently on vent, per pulmonary.  #Chronic diastolic heart failure: Volume optimization during dialysis.  #Anemia due to chronic disease and acute blood loss: Continue ESA, had IV iron 6/28.  Order IV iron during dialysis.  Monitor CBC.  #Bradycardia, PEA cardiac arrest: On dopamine now.  Seen by EP team, patient is not a candidate for permanent pacemaker EP intervention.   Subjective: Seen and examined at bedside.  On trach and vent.  No new event.  Still requiring dopamine IV.  Objective Vital signs in last 24 hours: Vitals:   10/30/18 0700 10/30/18 0731 10/30/18 0750 10/30/18 0901  BP: 118/70     Pulse: (!) 55     Resp: 20     Temp:  98.5 F (36.9 C)    TempSrc:  Oral    SpO2: 100%  99% 99%  Weight:      Height:       Weight change: 1.3 kg  Intake/Output Summary (Last 24 hours) at 10/30/2018 0953 Last data filed at 10/30/2018 0700 Gross per 24 hour  Intake 636.73 ml  Output -  Net 636.73 ml       Labs: Basic Metabolic Panel: Recent Labs  Lab 10/28/18 0228 10/29/18 0247 10/30/18 0353  NA 132* 132* 131*  K 4.4 3.8 3.7  CL 94* 97* 95*  CO2 _0 GLUCOSE 140* 250* 182*  BUN 55* 45* 70*  CREATININE 4.04* 3.03* 4.06*  CALCIUM 8.8*  8.7* 8.6*  PHOS 3.2 3.0 4.0   Liver Function Tests: Recent Labs  Lab 10/28/18 0228 10/29/18 0247 10/30/18 0353  ALBUMIN 2.3* 2.7* 2.3*   No results for input(s): LIPASE, AMYLASE in the last 168 hours. No results for input(s): AMMONIA in the last 168 hours. CBC: Recent Labs  Lab 10/26/18 0402 10/27/18 0409 10/28/18 0228 10/29/18 0247 10/30/18 0353  WBC 13.7* 11.3* 11.2* 6.7 7.3  HGB 7.7* 7.4* 8.6* 8.4* 8.2*  HCT 26.5* 25.6* 28.9* 28.5* 28.4*  MCV 95.0 95.9 95.7 95.6 95.9  PLT 117* 99* 108* 102* 97*   Cardiac Enzymes: No results for input(s): CKTOTAL, CKMB, CKMBINDEX, TROPONINI in the last 168 hours. CBG: Recent Labs  Lab 10/29/18 1546 10/29/18 1942 10/29/18 2348 10/30/18 0334 10/30/18 0728  GLUCAP 183* 171* 162* 173* 188*    Iron Studies: No results for input(s): IRON, TIBC, TRANSFERRIN, FERRITIN in the last 72 hours. Studies/Results: Dg Chest Port 1 View  Result Date: 10/29/2018 CLINICAL DATA:  PICC placement. EXAM: PORTABLE CHEST 1 VIEW COMPARISON:  Radiograph of October 24, 2018. FINDINGS: Stable cardiomegaly. Tracheostomy and feeding tube are unchanged in position. Right internal jugular dialysis catheter is unchanged in position. Interval placement of right-sided PICC line is noted with distal tip in expected position of the SVC. Mildly improved right  lung opacity is noted concerning for edema or pneumonia. Stable left perihilar and basilar opacity is noted concerning for edema or possibly pneumonia. No pneumothorax is noted. No significant pleural effusion is noted. Bony thorax is unremarkable. IMPRESSION: Interval placement of right-sided PICC line with distal tip in expected position of the SVC. Otherwise stable support apparatus. Slightly improved right lung opacity is noted concerning for edema or pneumonia. Stable left lung opacity as described above. Electronically Signed   By: James  Green Jr M.D.   On: 10/29/2018 16:07   [place] Ekg Site Rite  Result Date:  10/29/2018 If Site Rite image not attached, placement could not be confirmed due to current cardiac rhythm.   Medications: Infusions: . sodium chloride 250 mL (10/28/18 1540)  . ceFEPime (MAXIPIME) IV Stopped (10/29/18 1524)  . DOPamine 7 mcg/kg/min (10/30/18 0700)  . famotidine (PEPCID) IV Stopped (10/29/18 1330)  . feeding supplement (VITAL 1.5 CAL) 1,000 mL (10/29/18 1300)    Scheduled Medications: . aspirin  81 mg Per Tube Daily  . atorvastatin  40 mg Per Tube q1800  . chlorhexidine gluconate (MEDLINE KIT)  15 mL Mouth Rinse BID  . Chlorhexidine Gluconate Cloth  6 each Topical Daily  . Chlorhexidine Gluconate Cloth  6 each Topical Daily  . darbepoetin (ARANESP) injection - DIALYSIS  100 mcg Intravenous Q Wed-HD  . feeding supplement (PRO-STAT SUGAR FREE 64)  30 mL Per Tube TID  . fludrocortisone  0.2 mg Oral Daily  . insulin aspart  0-20 Units Subcutaneous Q4H  . insulin detemir  40 Units Subcutaneous BID  . mouth rinse  15 mL Mouth Rinse 10 times per day  . midodrine  20 mg Per Tube TID WC  . polyethylene glycol  17 g Oral Daily  . sodium chloride flush  10-40 mL Intracatheter Q12H    have reviewed scheduled and prn medications.  Physical Exam: General: Alert awake and responsive to the question, on vent Neck: Tracheostomy site looks clean with no bleeding Heart:RRR, s1s2 nl, no rubs Lungs: coarse mechanical breath sound, no wheezing Abdomen:soft, Non-tender, non-distended Extremities: No upper or lower extremity edema Dialysis Access: Right subclavian TDC.  Site clean   Prasad  10/30/2018,9:53 AM  LOS: 38 days  Pager: [phone]  

## 2018-10-30 NOTE — Progress Notes (Signed)
Dopamine weaned down from 7 mcg/kg/min slowly to assess for decrease in HR and BP. VS checked q 5 minutes. The dose got as low as 3 when her BP was 70's/40's and began titrating back up. HR was 60-62 and BP was 120/50 prior to decreasing dose. When her BP was 70/40 her HR was still 58-60. Dr. Jeffie Pollock didn't feel that a temporary or permanent pacemaker would help her based on this information.

## 2018-10-31 DIAGNOSIS — I509 Heart failure, unspecified: Secondary | ICD-10-CM

## 2018-10-31 DIAGNOSIS — I5023 Acute on chronic systolic (congestive) heart failure: Secondary | ICD-10-CM

## 2018-10-31 DIAGNOSIS — J9600 Acute respiratory failure, unspecified whether with hypoxia or hypercapnia: Secondary | ICD-10-CM

## 2018-10-31 DIAGNOSIS — Z515 Encounter for palliative care: Secondary | ICD-10-CM

## 2018-10-31 LAB — CBC
HCT: 27.9 % — ABNORMAL LOW (ref 36.0–46.0)
Hemoglobin: 8.2 g/dL — ABNORMAL LOW (ref 12.0–15.0)
MCH: 27.8 pg (ref 26.0–34.0)
MCHC: 29.4 g/dL — ABNORMAL LOW (ref 30.0–36.0)
MCV: 94.6 fL (ref 80.0–100.0)
Platelets: 101 10*3/uL — ABNORMAL LOW (ref 150–400)
RBC: 2.95 MIL/uL — ABNORMAL LOW (ref 3.87–5.11)
RDW: 28.2 % — ABNORMAL HIGH (ref 11.5–15.5)
WBC: 8.5 10*3/uL (ref 4.0–10.5)
nRBC: 0 % (ref 0.0–0.2)

## 2018-10-31 LAB — RENAL FUNCTION PANEL
Albumin: 2.3 g/dL — ABNORMAL LOW (ref 3.5–5.0)
Anion gap: 16 — ABNORMAL HIGH (ref 5–15)
BUN: 91 mg/dL — ABNORMAL HIGH (ref 8–23)
CO2: 20 mmol/L — ABNORMAL LOW (ref 22–32)
Calcium: 8.6 mg/dL — ABNORMAL LOW (ref 8.9–10.3)
Chloride: 92 mmol/L — ABNORMAL LOW (ref 98–111)
Creatinine, Ser: 4.98 mg/dL — ABNORMAL HIGH (ref 0.44–1.00)
GFR calc Af Amer: 10 mL/min — ABNORMAL LOW (ref >60)
GFR calc non Af Amer: 8 mL/min — ABNORMAL LOW (ref >60)
Glucose, Bld: 250 mg/dL — ABNORMAL HIGH (ref 70–99)
Phosphorus: 4 mg/dL (ref 2.5–4.6)
Potassium: 3.9 mmol/L (ref 3.5–5.1)
Sodium: 128 mmol/L — ABNORMAL LOW (ref 135–145)

## 2018-10-31 LAB — PROCALCITONIN: Procalcitonin: <0.1 ng/mL

## 2018-10-31 LAB — GLUCOSE, CAPILLARY
Glucose-Capillary: 228 mg/dL — ABNORMAL HIGH (ref 70–99)
Glucose-Capillary: 196 mg/dL — ABNORMAL HIGH (ref 70–99)
Glucose-Capillary: 201 mg/dL — ABNORMAL HIGH (ref 70–99)
Glucose-Capillary: 196 mg/dL — ABNORMAL HIGH (ref 70–99)
Glucose-Capillary: 225 mg/dL — ABNORMAL HIGH (ref 70–99)

## 2018-10-31 NOTE — Progress Notes (Addendum)
Physical Therapy Treatment Patient Details Name: Kathleen [name] MRN: [id number] DOB: 1950-12-02 Today's Date: 10/31/2018    History of Present Illness Kathleen [name] is a 68 y.o. female with history of permanent AF, hypertension, super morbid obesity, DM2, dyslipidemia, obesity hypoventilation syndrome (on home O2 with no OSA by prior PSG but showed nocturnal hypoxemia), CKD stage IV (baseline creatinine about 1.9), chronic diastolic CHF.  Admit for fluid overload. Also with pulmonary HTN.  PEA arrest 5/30, transfer to the ICU, extubater 6/3.  Intubated 10/06/18.  Trach on 10/14/18.      PT Comments    Pt admitted with above diagnosis. Pt currently with functional limitations due to balance and endurance deficits. Pt was able to stand to [facility] with mod assist and moved pt to the chair with Stedy.  Pt very anxious today and needed relaxation techniques.  Pt also vomited after transfer to the chair. Nurse made aware.  Feel that pt would benefit from [name] consult as she has been on vent for prolonged time.  Changed d/c recommendation today. Pt met 0/4 goals.  Goals revised.  Pt will benefit from skilled PT to increase their independence and safety with mobility to allow discharge to the venue listed below.     Follow Up Recommendations  LTACH;Supervision/Assistance - 24 hour     Equipment Recommendations  None recommended by PT;Other (comment)(TBA)    Recommendations for Other Services Rehab consult     Precautions / Restrictions Precautions Precautions: Fall Precaution Comments: watch O2 sats; for session on vent today Restrictions Weight Bearing Restrictions: No    Mobility  Bed Mobility Overal bed mobility: Needs Assistance Bed Mobility: Supine to Sit     Supine to sit: Max assist     General bed mobility comments: pt needed significant assist to scoot, Also needed truncal assist to come up via right elbow.  Transfers Overall transfer level: Needs assistance   Transfers: Sit  to/from Stand;Stand Pivot Transfers Sit to Stand: Mod assist;+2 physical assistance;+2 safety/equipment;From elevated surface Stand pivot transfers: +2 physical assistance;Total assist       General transfer comment: Used STedy with pt needing mod assist to power up.  Bed was elevated and bed max inflated as she could not stand with out these provisions.  Once in [place] moved pt to chair.  Pt overall very slow to respond and at times has a "blank stare" when PT talking to her.  Slow processing of commands and difficulty with following commands. Pt coughed up phlegm and then vomited once in chair. Cleaned pt and changed her gown. Nurse aware.   Ambulation/Gait                 Stairs             Wheelchair Mobility    Modified Rankin (Stroke Patients Only)       Balance Overall balance assessment: Needs assistance Sitting-balance support: Bilateral upper extremity supported;Feet supported Sitting balance-Leahy Scale: Poor Sitting balance - Comments: Pt able to sit EOB with min  assist prior to transfer. Needed UE support   Standing balance support: Bilateral upper extremity supported;During functional activity Standing balance-Leahy Scale: Poor Standing balance comment: reliant on external support and UE support                            Cognition Arousal/Alertness: Awake/alert Behavior During Therapy: WFL for tasks assessed/performed Overall Cognitive Status: Within Functional Limits for tasks assessed  General Comments: I suspect anxiety keeping pt from following directions at EOB sometimes.      Exercises General Exercises - Upper Extremity Shoulder Flexion: AROM;Both;10 reps;Supine;Theraband Theraband Level (Shoulder Flexion): Level 4 (Blue) Shoulder Horizontal ADduction: AROM;Both;10 reps;Supine;Theraband Theraband Level (Shoulder Horizontal Adduction): Level 4 (Blue) Elbow Flexion: AROM;Both;10  reps;Supine Elbow Extension: AROM;Both;10 reps;Supine General Exercises - Lower Extremity Ankle Circles/Pumps: AROM;Both;10 reps;Supine Long Arc Quad: AROM;Both;10 reps;Seated    General Comments General comments (skin integrity, edema, etc.): Pt on vent/trach today with 40% FiO2, PEEP 5.  HR 58 bpm, 100% O2 sats, 123/91      Pertinent Vitals/Pain Pain Assessment: No/denies pain    Home Living                      Prior Function            PT Goals (current goals can now be found in the care plan section) Acute Rehab PT Goals Patient Stated Goal: to go home PT Goal Formulation: With patient Time For Goal Achievement: 11/14/18 Potential to Achieve Goals: Good Progress towards PT goals: Progressing toward goals    Frequency    Min 3X/week      PT Plan Discharge plan needs to be updated    Co-evaluation PT/OT/SLP Co-Evaluation/Treatment: Yes Reason for Co-Treatment: Complexity of the patient's impairments (multi-system involvement);For patient/therapist safety PT goals addressed during session: Mobility/safety with mobility        AM-PAC PT "6 Clicks" Mobility   Outcome Measure  Help needed turning from your back to your side while in a flat bed without using bedrails?: A Lot Help needed moving from lying on your back to sitting on the side of a flat bed without using bedrails?: A Lot Help needed moving to and from a bed to a chair (including a wheelchair)?: Total Help needed standing up from a chair using your arms (e.g., wheelchair or bedside chair)?: A Lot Help needed to walk in hospital room?: Total Help needed climbing 3-5 steps with a railing? : Total 6 Click Score: 9    End of Session Equipment Utilized During Treatment: Gait belt;Oxygen(vent/trach) Activity Tolerance: Other (comment);Patient limited by fatigue(limited by anxiety) Patient left: in chair;with call bell/phone within reach;with chair alarm set Nurse Communication: Mobility  status;Need for lift equipment PT Visit Diagnosis: Other abnormalities of gait and mobility (R26.89);Muscle weakness (generalized) (M62.81)     Time: 2761-4709 PT Time Calculation (min) (ACUTE ONLY): 28 min  Charges:  $Therapeutic Exercise: 8-22 mins $Therapeutic Activity: 8-22 mins                     [place] Pager:  [phone]  Office:  [place] 10/31/2018, 1:27 PM

## 2018-10-31 NOTE — Progress Notes (Signed)
[facility] Progress Note    Assessment/ Plan:   1.  Acute kidney injury Superimposed on CKD IV (baseline 1.7-1.9) likely ATN secondary to shock, anuric: Transitioned to iHD on 6/27. Using right TDC.  She is on midodrine for hypotension at 20 TID and florinef 0.2 mg daily added 7/4.  HD today 7/6.  Continue supportive care at this present time.  2.  Proteus +respiratory culture: on cefepime, to end 7/8 per PCCM.    3.  Chronic respiratory failure: Status post tracheostomy.  Currently on vent, per pulmonary.  4.  Acute on chronic diastolic heart failure: Appreciate the expertise of the heart failure team.  Plans to try to correlate bradycardia with hypotension to see if could/ would be a candidate for TVP/ RV micro device.    5.  Anemia due to chronic disease and acute blood loss: Continue ESA, had IV iron 6/28.  Order IV iron during dialysis.  Monitor CBC.  6.  Bradycardia, PEA cardiac arrest: On dopamine now.  Seen by EP team, patient is not a candidate for permanent pacemaker EP intervention.   AHF following as above  7.  Dispo: remains in ICU.  Subjective:    Seen and examined on dialysis.  Qb 400 via TDC, UF goal 2.5L neg.  On dopamine at 8 with BP in the low 100s and HR in the mid-50s.     Objective:   BP (!) 124/34   Pulse (!) 55   Temp 98.3 F (36.8 C) (Oral)   Resp (!) 23   Ht 5' (1.524 m)   Wt 109.8 kg   SpO2 98%   BMI 47.28 kg/m   Intake/Output Summary (Last 24 hours) at 10/31/2018 0100 Last data filed at 10/31/2018 0800 Gross per 24 hour  Intake 1441.94 ml  Output 0 ml  Net 1441.94 ml   Weight change: 2.4 kg  Physical Exam: Gen: obese, lying in bed, NAD, conversant/ awake/ alert HEENT: trach in place, on vent CVS: RRR right now Resp:dim breath sounds bilaterally Abd: obese Ext: trace pitting edema [facility]  Imaging: Dg Chest Port 1 View  Result Date: 10/29/2018 CLINICAL DATA:  PICC placement. EXAM: PORTABLE CHEST 1 VIEW  COMPARISON:  Radiograph of October 24, 2018. FINDINGS: Stable cardiomegaly. Tracheostomy and feeding tube are unchanged in position. Right internal jugular dialysis catheter is unchanged in position. Interval placement of right-sided PICC line is noted with distal tip in expected position of the SVC. Mildly improved right lung opacity is noted concerning for edema or pneumonia. Stable left perihilar and basilar opacity is noted concerning for edema or possibly pneumonia. No pneumothorax is noted. No significant pleural effusion is noted. Bony thorax is unremarkable. IMPRESSION: Interval placement of right-sided PICC line with distal tip in expected position of the SVC. Otherwise stable support apparatus. Slightly improved right lung opacity is noted concerning for edema or pneumonia. Stable left lung opacity as described above. Electronically Signed   By: Marijo Conception M.D.   On: 10/29/2018 16:07    Labs: BMET Recent Labs  Lab 10/25/18 0524 10/26/18 0402 10/27/18 0409 10/28/18 0228 10/29/18 0247 10/30/18 0353 10/31/18 0521  NA 134* 133* 134* 132* 132* 131* 128*  K 4.5 4.5 4.1 4.4 3.8 3.7 3.9  CL 99 98 96* 94* 97* 95* 92*  CO2 24 24 26 25 25 22  20*  GLUCOSE 165* 262* 220* 140* 250* 182* 250*  BUN 46* 69* 37* 55* 45* 70* 91*  CREATININE 2.99* 4.13*  2.90* 4.04* 3.03* 4.06* 4.98*  CALCIUM 8.3* 8.4* 8.4* 8.8* 8.7* 8.6* 8.6*  PHOS 2.2* 2.6 2.2* 3.2 3.0 4.0 4.0   CBC Recent Labs  Lab 10/28/18 0228 10/29/18 0247 10/30/18 0353 10/31/18 0521  WBC 11.2* 6.7 7.3 8.5  HGB 8.6* 8.4* 8.2* 8.2*  HCT 28.9* 28.5* 28.4* 27.9*  MCV 95.7 95.6 95.9 94.6  PLT 108* 102* 97* 101*    Medications:    . aspirin  81 mg Per Tube Daily  . atorvastatin  40 mg Per Tube q1800  . chlorhexidine gluconate (MEDLINE KIT)  15 mL Mouth Rinse BID  . Chlorhexidine Gluconate Cloth  6 each Topical Daily  . Chlorhexidine Gluconate Cloth  6 each Topical Daily  . Chlorhexidine Gluconate Cloth  6 each Topical Q0600  .  darbepoetin (ARANESP) injection - DIALYSIS  100 mcg Intravenous Q Wed-HD  . feeding supplement (PRO-STAT SUGAR FREE 64)  30 mL Per Tube TID  . fludrocortisone  0.2 mg Oral Daily  . guaiFENesin  15 mL Per Tube Q6H  . insulin aspart  0-20 Units Subcutaneous Q4H  . insulin detemir  40 Units Subcutaneous BID  . mouth rinse  15 mL Mouth Rinse 10 times per day  . midodrine  20 mg Per Tube TID WC  . polyethylene glycol  17 g Oral Daily  . sodium chloride flush  10-40 mL Intracatheter Q12H      Madelon Lips, MD [facility] pgr [phone] 10/31/2018, 9:37 AM

## 2018-10-31 NOTE — Consult Note (Signed)
Consultation Note Date: 10/31/2018   Patient Name: Kathleen [name]  DOB: [birth year]  MRN: [id number]  Age / Sex: 68 y.o., female  PCP: Aldine Contes, MD Referring Physician: Margaretha Seeds, MD  Reason for Consultation: Establishing goals of care and Psychosocial/spiritual support  HPI/Patient Profile: 68 y.o. female  with past medical history of CKD 4, CHF-D, morbid obesity with hypoventilation syndrome, DM, atrial fibrillation, DM, IBS who was admitted on 09/22/2018 with an acute on chronic heart failure exacerbation.   Her kidney function worsened.  She underwent RHC and was found to have markedly elevated filling pressures complicated by cardio-renal syndrome.  She was also noted to have severe pulmonary HTN.  On 5/30 she became very bradycardic and experienced a PEA arrest.  She received CPR for 8 minutes and was revived.  She was intubated and placed in the ICU and started on CVVHD.  She was extubated on 6/3, but began to refuse bipap and was re-intubated again on 6/11. She developed MSSA PNA.  Tracheostomy was placed on 6/19.  She remained on CVVHD with pressor support for an extended duration and lost 40+kg.  She was transitioned to intermittent HD on 6/27, but remained on midodrine for hypotension.  Her pulse rate was in the 40s.    Currently she is requiring vent support, on intermittent hemodialysis, but battling hypotension and bradycardia.  She is on midodrine 20 TID and dopamine gtt.  Pulse rates are in the 50s.  Clinical Assessment and Goals of Care:  I have reviewed medical records including EPIC notes, labs and imaging, received report from the ICU RN, Dr. Loanne Drilling, and Dr. Haroldine Laws, assessed the patient and then spoke on the phone with her son Kathleen [name] and daughter-in-law Kathleen [name] to discuss diagnosis prognosis, GOC, disposition and options.  I introduced Palliative Medicine as specialized  medical care for people living with serious illness. It focuses on providing relief from the symptoms and stress of a serious illness.   We discussed a brief life review of the patient.  That he has 1 son, Kathleen [name] and 1 daughter from whom she is estranged.  For many years she worked at the [employer].  She was a single mom after the father of her children left and she  raised them on her own.  Prior to admission she was living on her own with her bird, Abby.  She loves children (she indicates to me that she has 2 grandchildren), and she loves her "stories "soap operas.    In speaking directly with Kathleen [name] she indicated that she has had problems with her kidneys for a long time but never anticipated that she would need dialysis.  Further prior to this admission she had always felt that she did not want dialysis.  Chrisanna tells me she believes in God, but she does not consider herself religious and she is not a Microbiologist.  Ebelyn is awake and seems to be alert.  She has paper to write on but does not seem to want to  write.  When we started to talk about her condition and Kathleen [name] she began to cry and I was unable to speak with her about goals of care.  I spoke on the phone with Andrew.  Kathleen [name] listened on speaker phone.  We discussed her kidneys, lungs and her heart. I explained that Yarieliz is extremely fragile.  Having three main organ systems fail can frequently be fatal.   After speaking with Dr. Haroldine Laws I was able to explain to the family that Tanaysia's low blood pressure (rather than her low pulse rate) is the main problem.  A pace maker or micro pace maker would not help because her heart rate does not seem to be effecting her blood pressure.  I attempted to be as gentle but as straight forward as possible and explained that if we can not find an outpatient medication that will keep her BP up she will likely die in the hospital.  We also talked about things going well and progressing towards LTAC.   Kathleen [name] remained focused on the positive and asked about an LTAC in state.    We discussed code status.  I suggested they at a minimum consider a partial code.  Kathleen [name] and Kathleen [name] will give it consideration.  They understand it would be a protective measure to avoid harm at end of life.  Kathleen [name] and Kathleen [name] would like to visit on Thursday 7/9 for a goals of care meeting.  My assumption was that we will have more information at that time with regard to being able to stabilize Kathleen [name]'s blood pressure.   Primary Decision Maker:  NEXT OF KIN Son Kathleen [name]    SUMMARY OF RECOMMENDATIONS    PMT will follow with you. Kathleen [name] and Kathleen [name] would like to plan a visit for Kathleen [name] discussion on Thursday 7/9.  (They live in [place])  Code Status/Advance Care Planning:  Full code   Symptom Management:   Per primary team.  Additional Recommendations (Limitations, Scope, Preferences):  Full Scope Treatment  Psycho-social/Spiritual:   Desire for further Chaplaincy support: no at this time.  Prognosis:  If her blood pressure can be stabilized on oral medications she may have months to years.  If not she will pass in the hospital in a number of days.    Discharge Planning: To Be Determined  Hopefully LTAC with Palliative to follow      Primary Diagnoses: Present on Admission:  Type 2 diabetes mellitus with other specified complication ([place])  Dyslipidemia  OBESITY   I have reviewed the medical record, interviewed the patient and family, and examined the patient. The following aspects are pertinent.  Past Medical History:  Diagnosis Date   Atrial flutter ([place])    a. permanent.   Bradycardia    Chronic diastolic (congestive) heart failure ([place]) 01/10/2015   CKD (chronic kidney disease), stage IV (HCC)    Degenerative joint disease of hand    Diabetes mellitus    Dyslipidemia    Fecal occult blood test positive    GERD (gastroesophageal reflux disease)     Headache    Hypertension    Inadequate material resources    Irritable bowel syndrome    Morbid obesity (HCC)    Obesity hypoventilation syndrome (HCC)    Post-menopausal bleeding    Shortness of breath dyspnea    multifactorial from obesity, deconditioning, obesity hypoventilation syndrome   Social History   Socioeconomic History   Marital status: Divorced    Spouse name: Not on file   Number  of children: 2   Years of education: Not on file   Highest education level: Not on file  Occupational History   Occupation: unemployed    Fish farm manager: UNEMPLOYED  Social Designer, fashion/clothing strain: Not on file   Food insecurity    Worry: Not on file    Inability: Not on file   Transportation needs    Medical: Not on file    Non-medical: Not on file  Tobacco Use   Smoking status: Never Smoker   Smokeless tobacco: Never Used  Substance and Sexual Activity   Alcohol use: No    Alcohol/week: 0.0 standard drinks   Drug use: No   Sexual activity: Not Currently  Lifestyle   Physical activity    Days per week: Not on file    Minutes per session: Not on file   Stress: Not on file  Relationships   Social connections    Talks on phone: Not on file    Gets together: Not on file    Attends religious service: Not on file    Active member of club or organization: Not on file    Attends meetings of clubs or organizations: Not on file    Relationship status: Not on file  Other Topics Concern   Not on file  Social History Narrative   Single.   Divorced x2.   Has 2 children, by two different fathers.   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 May apply for disability/SSI given her pain in her hands which has made interviewing for jobs difficult.   Never Smoked   Alcohol use-no   Drug use-no   Regular exercise-yes      Financial assistance approved for 100% discount at [facility] and has  [facility] card per [employer]   02/18/2010            Family History  Problem Relation Age of Onset   Diabetes Mother    Obesity Mother    Stroke Father    Diabetes Sister    Colon cancer Neg Hx    Scheduled Meds:  aspirin  81 mg Per Tube Daily   atorvastatin  40 mg Per Tube q1800   chlorhexidine gluconate (MEDLINE KIT)  15 mL Mouth Rinse BID   Chlorhexidine Gluconate Cloth  6 each Topical Daily   Chlorhexidine Gluconate Cloth  6 each Topical Daily   Chlorhexidine Gluconate Cloth  6 each Topical Q0600   darbepoetin (ARANESP) injection - DIALYSIS  100 mcg Intravenous Q Wed-HD   feeding supplement (PRO-STAT SUGAR FREE 64)  30 mL Per Tube TID   fludrocortisone  0.2 mg Oral Daily   guaiFENesin  15 mL Per Tube Q6H   insulin aspart  0-20 Units Subcutaneous Q4H   insulin detemir  40 Units Subcutaneous BID   mouth rinse  15 mL Mouth Rinse 10 times per day   midodrine  20 mg Per Tube TID WC   polyethylene glycol  17 g Oral Daily   sodium chloride flush  10-40 mL Intracatheter Q12H   Continuous Infusions:  sodium chloride 250 mL (10/28/18 1540)   ceFEPime (MAXIPIME) IV Stopped (10/30/18 1720)   DOPamine 8 mcg/kg/min (10/31/18 1000)   famotidine (PEPCID) IV 20 mg (10/31/18 1633)   feeding supplement (VITAL 1.5 CAL) 1,000 mL (10/29/18 1300)   ferric gluconate (FERRLECIT/NULECIT) IV 125 mg (10/31/18 1321)   PRN Meds:.sodium chloride, acetaminophen (TYLENOL) oral  liquid 160 mg/5 mL, alteplase, alteplase, bisacodyl, docusate, heparin, heparin, lidocaine (PF), lidocaine-prilocaine, midazolam, ondansetron, pentafluoroprop-tetrafluoroeth, sodium chloride flush Allergies  Allergen Reactions   Doxycycline     REACTION: Wheals and pruritus   Review of Systems unable  Physical Exam  Very pleasant female sitting in recliner chair - smiling and communicating with facial expression and her hands, Cor Trak in place Trached currently on vent tolerating pressure  support. +rales CV bradycardic Abdomen obese, distended but not tight  Vital Signs: BP (!) 114/42    Pulse (!) 48    Temp 98.5 F (36.9 C) (Oral)    Resp 17    Ht 5' (1.524 m)    Wt 107.3 kg    SpO2 90%    BMI 46.20 kg/m  Pain Scale: 0-10 POSS *See Group Information*: 1-Acceptable,Awake and alert Pain Score: 0-No pain   SpO2: SpO2: 90 % O2 Device:SpO2: 90 % O2 Flow Rate: .O2 Flow Rate (L/min): 10 L/min  IO: Intake/output summary:   Intake/Output Summary (Last 24 hours) at 10/31/2018 1747 Last data filed at 10/31/2018 1030 Gross per 24 hour  Intake 1348.03 ml  Output 2500 ml  Net -1151.97 ml    LBM: Last BM Date: 10/29/18 Baseline Weight: Weight: 136 kg Most recent weight: Weight: 107.3 kg     Palliative Assessment/Data: 10%     Time In: 4:00 Time Out: 6:00 Time Total: 120 min. Visit consisted of counseling and education dealing with the complex and emotionally intense issues surrounding the need for palliative care and symptom management in the setting of serious and potentially life-threatening illness. Greater than 50%  of this time was spent counseling and coordinating care related to the above assessment and plan.  Signed by: Florentina Jenny, PA-C Palliative Medicine Pager: [phone]  Please contact Palliative Medicine Team phone at [phone] for questions and concerns.  For individual provider: See Shea Evans

## 2018-10-31 NOTE — Progress Notes (Signed)
NAME:  Kathleen [name], MRN:  [id number], DOB:  09-17-1950, LOS: 6 ADMISSION DATE:  09/22/2018, CONSULTATION DATE: 09/24/2018 REFERRING MD: Candee Furbish MD, CHIEF COMPLAINT: Cardiac arrest  Brief History   68 y/o obese woman with chronic diastolic heart failure who failed outpatient diuretics management due to chronic kidney disease.  Right heart cath on 5/29 showed markedly elevated biventricular filling pressures with normal cardiac output.  She was on cardiology service, maintained on milrinone and Lasix.  On the morning of 5/30 she went into PEA arrest, CPR performed for 8 minutes.  Intubated and transferred to ICU.  Started on CRRT 5/31.  Extubated 6/3.  Re-intubated early am 6/11 for hypercarbic respiratory failure and underwent tracheostomy on 6/19  Past Medical History   has a past medical history of Atrial flutter ([place]), Bradycardia, Chronic diastolic (congestive) heart failure ([place]) (01/10/2015), CKD (chronic kidney disease), stage IV ([place]), Degenerative joint disease of hand, Diabetes mellitus, Dyslipidemia, Fecal occult blood test positive, GERD (gastroesophageal reflux disease), Headache, Hypertension, Inadequate material resources, Irritable bowel syndrome, Morbid obesity ([place]), Obesity hypoventilation syndrome ([place]), Post-menopausal bleeding, and Shortness of breath dyspnea.  Significant Hospital Events   5/28 Admit 5/29 RHC 5/30 PEA arrest, intubated/ CRRT 5/31 Milrinone stopped due to ectopy; brady episode- amio stopped 6/02 Attempt at SBT, chest x-ray with worsening pulmonary edema, CVVHD for volume removal 6/03 Extubated > remain on CRRT.  Refused nocturnal BiPAP 6/04 Refused nocturnal BiPAP 6/10 off CRRT 6/11 Intubated early am with hypercarbia, concern for RUL PNA; abx broadened; restarted on CRRT 6/16  No events overnight, weaning on high PS 6/17  failed vent wean, remains on levophed, midodrine and CRRT 6/18  Weaned for 5 hours on PSV 8/5 before requiring full support;  ongoing CRRT, remains on levophed 14 mcg 6/19 - TRACH + Dr Erskine Emery 6/20 -. On IV heparin gt, levophed gtt with midodrine. Off fent gtt. Pressopr needs down 6/26 - TCT tolerated for the past 24 hours  6/27 - Stable on TCT now >48hrs, tolerated first session of iHD  6/30 hypotension / bradycardia overnight, started on dopamine 7/2 Placed on ATC at 40% fiO2  Consults:  Cardiology PCCM  Nephrology  EP  Procedures:  Lt PICC 5/29 >> 6/30 OETT 5/30 >> 6/3; 6/11 > 6/19 - TRACH (Dr Erskine Emery) >> R IJ HD cath 5/30 >> Aline left ulnar 5/30 >> 6/2  Significant Diagnostic Tests:  TTE 5/28 >>The left ventricle has normal systolic function with an ejection fraction of 60-65%. . The right ventricle has normal systolic function. The cavity was mildly enlarged.   Right heart cath 5/29 RA = 24 RV = 94/26 PA = 95/36 (53) PCW = 30 (v = 50) Fick cardiac output/index = 6.0/2.7 PVR =3.9 WU FA sat = 91% PA sat = 58%, 58% SVC 60%  .   Micro Data:  SARS coronavirus 2 cepheid 5/28 >> neg MRSA PCR 5/28 >> neg Trach aspirate 6/2 >> MSSA BCx2 6/2 >> negative ...................... Tracheal aspirate 6/11 >> few GNR >> few proteus and few candida tropicalis BCx2 6/11 >> negative 10/27/2018 tracheal aspirate>> gram-positive cocci gram-negative rods>>  Antimicrobials:  Vancomycin 6/3 > 6/4 Cefazolin 6/4 >> 6/10 ...........................Marland Kitchen Vanco 6/11 >> 6/17 Cefepime 6/11 >>6/18 Cefepime 10/27/2018>>  Interim history/subjective:  Continues to require dopamine.  Now up to 8.  Objective   Blood pressure (!) 105/40, pulse (!) 54, temperature 98.3 F (36.8 C), temperature source Oral, resp. rate (!) 23, height 5' (1.524 m), weight 109.8 kg,  SpO2 98 %.    Vent Mode: PRVC FiO2 (%):  [40 %] 40 % Set Rate:  [20 bmp] 20 bmp Vt Set:  [360 mL] 360 mL PEEP:  [5 cmH20] 5 cmH20 Plateau Pressure:  [12 cmH20-24 cmH20] 12 cmH20   Intake/Output Summary (Last 24 hours) at 10/31/2018 1610 Last data filed  at 10/31/2018 0700 Gross per 24 hour  Intake 1384.19 ml  Output -  Net 1384.19 ml   Filed Weights   10/28/18 1300 10/30/18 0500 10/31/18 0430  Weight: 104.8 kg 107.4 kg 109.8 kg    GEN: pleasant elderly woman on vent, in NAD HEENT: trach in place, secretions improved CV: bradycardic, ext warm PULM: Diminished in bases GI: soft, +BS EXT: 1+ edema NEURO: moves all 4 ext briskly to command PSYCH: nodding to questions appropriately SKIN: no rashes     Assessment & Plan:    HD dependent renal failure. - Continue HD per nephrology.  Persistent resp failure now trach'd - Continue vent support with daily weaning.  Proteus colonization vs. Bronchitis. - Continue cefepime x 7 days (stop date 7/8).  Persistent shock after PEA arrest. Bradycardia - appears dopamine dependent. - Continue florinef and midodrine as ordered. - Continue weaning attempts of dopamine. - Cards / EP following, to consider TVP (and later RV micro device) depending on whether BP drop corresponds with HR drop when dopamine is weaned.  Nutrition. - Continue TF's. - Needs a PEG at some point.  Goals of care. - Palliative consult placed and appreciated, lots of family moral distress at this time  Best practice:  Diet: Continue tube feed Pain/Anxiety/Delirium protocol (if indicated): NA VAP protocol (if indicated): In place  DVT prophylaxis: None GI prophylaxis: Pepcid Glucose control: SSI Mobility: Bedrest Code Status: Full Disposition: ICU    Montey Hora, PA - [facility] Pager: [phone] - [phone].  If no answer, [phone] 10/31/2018, 8:35 AM

## 2018-11-01 ENCOUNTER — Ambulatory Visit: Payer: Medicare HMO | Admitting: *Deleted

## 2018-11-01 LAB — RENAL FUNCTION PANEL
Albumin: 2.3 g/dL — ABNORMAL LOW (ref 3.5–5.0)
Anion gap: 13 (ref 5–15)
BUN: 55 mg/dL — ABNORMAL HIGH (ref 8–23)
CO2: 22 mmol/L (ref 22–32)
Calcium: 8.2 mg/dL — ABNORMAL LOW (ref 8.9–10.3)
Chloride: 94 mmol/L — ABNORMAL LOW (ref 98–111)
Creatinine, Ser: 3.48 mg/dL — ABNORMAL HIGH (ref 0.44–1.00)
GFR calc Af Amer: 15 mL/min — ABNORMAL LOW (ref >60)
GFR calc non Af Amer: 13 mL/min — ABNORMAL LOW (ref >60)
Glucose, Bld: 253 mg/dL — ABNORMAL HIGH (ref 70–99)
Phosphorus: 2.6 mg/dL (ref 2.5–4.6)
Potassium: 3.7 mmol/L (ref 3.5–5.1)
Sodium: 129 mmol/L — ABNORMAL LOW (ref 135–145)

## 2018-11-01 LAB — T4, FREE: Free T4: 1.05 ng/dL (ref 0.61–1.12)

## 2018-11-01 LAB — CBC
HCT: 27.5 % — ABNORMAL LOW (ref 36.0–46.0)
Hemoglobin: 8 g/dL — ABNORMAL LOW (ref 12.0–15.0)
MCH: 27.8 pg (ref 26.0–34.0)
MCHC: 29.1 g/dL — ABNORMAL LOW (ref 30.0–36.0)
MCV: 95.5 fL (ref 80.0–100.0)
Platelets: 117 10*3/uL — ABNORMAL LOW (ref 150–400)
RBC: 2.88 MIL/uL — ABNORMAL LOW (ref 3.87–5.11)
RDW: 28.5 % — ABNORMAL HIGH (ref 11.5–15.5)
WBC: 8.1 10*3/uL (ref 4.0–10.5)
nRBC: 0 % (ref 0.0–0.2)

## 2018-11-01 LAB — GLUCOSE, CAPILLARY
Glucose-Capillary: 165 mg/dL — ABNORMAL HIGH (ref 70–99)
Glucose-Capillary: 183 mg/dL — ABNORMAL HIGH (ref 70–99)
Glucose-Capillary: 187 mg/dL — ABNORMAL HIGH (ref 70–99)
Glucose-Capillary: 197 mg/dL — ABNORMAL HIGH (ref 70–99)
Glucose-Capillary: 198 mg/dL — ABNORMAL HIGH (ref 70–99)
Glucose-Capillary: 209 mg/dL — ABNORMAL HIGH (ref 70–99)
Glucose-Capillary: 221 mg/dL — ABNORMAL HIGH (ref 70–99)
Glucose-Capillary: 238 mg/dL — ABNORMAL HIGH (ref 70–99)

## 2018-11-01 LAB — PROCALCITONIN: Procalcitonin: <0.1 ng/mL

## 2018-11-01 LAB — TSH: TSH: 2.461 u[IU]/mL (ref 0.350–4.500)

## 2018-11-01 MED ORDER — OSMOLITE 1.5 CAL PO LIQD
1000.0000 mL | ORAL | Status: DC
  Administered 2018-11-01 – 2018-11-12 (×9): 1000 mL
  Filled 2018-11-01 (×14): qty 1000

## 2018-11-01 MED ORDER — CHLORHEXIDINE GLUCONATE 0.12 % MT SOLN
OROMUCOSAL | Status: AC
  Administered 2018-11-01: 15 mL via OROMUCOSAL
  Filled 2018-11-01: qty 15

## 2018-11-01 NOTE — Progress Notes (Signed)
Daily Progress Note   Patient Name: Kathleen [name]       Date: 11/01/2018 DOB: 1951-02-08  Age: 68 y.o. MRN#: [id number] Attending Physician: Margaretha Seeds, MD Primary Care Physician: Aldine Contes, MD Admit Date: 09/22/2018  Reason for Consultation/Follow-up: Establishing goals of care  Subjective: [name] is sitting up in bed. Smiling and interactive. Denies any pain or distress.   Length of Stay: 40  Current Medications: Scheduled Meds:  . aspirin  81 mg Per Tube Daily  . atorvastatin  40 mg Per Tube q1800  . chlorhexidine gluconate (MEDLINE KIT)  15 mL Mouth Rinse BID  . Chlorhexidine Gluconate Cloth  6 each Topical Daily  . Chlorhexidine Gluconate Cloth  6 each Topical Daily  . Chlorhexidine Gluconate Cloth  6 each Topical Q0600  . darbepoetin (ARANESP) injection - DIALYSIS  100 mcg Intravenous Q Wed-HD  . feeding supplement (PRO-STAT SUGAR FREE 64)  30 mL Per Tube TID  . fludrocortisone  0.2 mg Oral Daily  . guaiFENesin  15 mL Per Tube Q6H  . insulin aspart  0-20 Units Subcutaneous Q4H  . insulin detemir  40 Units Subcutaneous BID  . mouth rinse  15 mL Mouth Rinse 10 times per day  . midodrine  20 mg Per Tube TID WC  . polyethylene glycol  17 g Oral Daily  . sodium chloride flush  10-40 mL Intracatheter Q12H    Continuous Infusions: . sodium chloride 250 mL (10/31/18 2358)  . DOPamine 5 mcg/kg/min (11/01/18 1200)  . famotidine (PEPCID) IV Stopped (10/31/18 1703)  . feeding supplement (OSMOLITE 1.5 CAL)    . feeding supplement (VITAL 1.5 CAL) 1,000 mL (10/31/18 1825)  . ferric gluconate (FERRLECIT/NULECIT) IV Stopped (10/31/18 1422)    PRN Meds: sodium chloride, acetaminophen (TYLENOL) oral liquid 160 mg/5 mL, alteplase, alteplase, bisacodyl, docusate, heparin,  heparin, lidocaine (PF), lidocaine-prilocaine, midazolam, ondansetron, pentafluoroprop-tetrafluoroeth, sodium chloride flush  Physical Exam Vitals signs and nursing note reviewed.  Constitutional:      General: She is not in acute distress.    Appearance: She is obese. She is ill-appearing.  Cardiovascular:     Rate and Rhythm: Bradycardia present.  Pulmonary:     Effort: No tachypnea, accessory muscle usage or respiratory distress.     Comments: Trach to vent Abdominal:  Palpations: Abdomen is soft.  Neurological:     Mental Status: She is alert.     Comments: Appears oriented although nonverbal s/t trach to vent. Refuses to write. Follows commands and nods yes/no appropriately to questions.              Vital Signs: BP (!) 139/59   Pulse (!) 57   Temp 98.1 F (36.7 C) (Oral)   Resp (!) 21   Ht 5' (1.524 m)   Wt 107.3 kg   SpO2 98%   BMI 46.20 kg/m  SpO2: SpO2: 98 % O2 Device: O2 Device: Ventilator O2 Flow Rate: O2 Flow Rate (L/min): 40 L/min  Intake/output summary:   Intake/Output Summary (Last 24 hours) at 11/01/2018 1435 Last data filed at 11/01/2018 1200 Gross per 24 hour  Intake 1830.77 ml  Output -  Net 1830.77 ml   LBM: Last BM Date: 10/31/18 Baseline Weight: Weight: 136 kg Most recent weight: Weight: 107.3 kg       Palliative Assessment/Data:      Patient Active Problem List   Diagnosis Date Noted  . Acute respiratory failure ([place])   . CHF (congestive heart failure) ([place])   . Palliative care encounter   . Shock ([place])   . VAP (ventilator-associated pneumonia) ([place]) 10/23/2018  . Acute on chronic diastolic CHF (congestive heart failure) ([place]) 09/22/2018  . Acute kidney injury superimposed on chronic kidney disease ([place]) 09/22/2018  . Acute respiratory failure with hypoxia ([place]) 09/22/2018  . History of recent fall 09/06/2018  . Tinea pedis 08/31/2017  . GERD (gastroesophageal reflux disease) 08/24/2016  . Atrial flutter ([place]) 04/06/2016  .  Mobitz type 1 second degree atrioventricular block 02/15/2015  . Chronic diastolic congestive heart failure ([place]) 01/10/2015  . CKD (chronic kidney disease) stage 3, GFR 30-59 ml/min (HCC)   . Chronic respiratory failure ([place]) 06/30/2013  . ACE-inhibitor cough 09/29/2012  . Onychomycosis 10/06/2011  . Type 1 superior labral anterior-to-posterior (SLAP) tear of shoulder 07/02/2011  . Preventative health care 02/12/2011  . Hypertension 02/12/2011  . Peripheral neuropathy 09/06/2010  . OBESITY 11/13/2008  . Type 2 diabetes mellitus with other specified complication ([place]) 31/54/0086  . Dyslipidemia 11/06/2007    Palliative Care Assessment & Plan   HPI: 68 yo female with past medical history of CKD 4, diastolic CHF, morbid obesity with hypoventilation syndrome, diabetes, atrial fibrillation, IBSwho was admitted on 5/28/2020with an acute on chronic heart failure exacerbation and with worsening renal function. She underwent RHC and was found to have markedly elevated filling pressures complicated by cardio-renal syndrome. She was also noted to have severe pulmonary hypertension.On 5/30 she became very bradycardic and experienced a PEA arrest. She received CPR for 8 minutes with ROSC. She was intubated and placed in the ICU and started on CVVHD. She was extubated on 6/3, but began to refuse bipap and was re-intubated again on 6/11. She developed MSSA PNA. Tracheostomy was placed on 6/19. She remained on CVVHD with pressor support for an extended duration and lost 40+kg. She was transitioned to intermittent HD on 6/27, but remains on midodrine and florinef for hypotension. She continues to require dopamine infusion for bradycardia with continued attempts to wean dopamine and vent.   Assessment: I met today with Monai who is very pleasant and tolerating vent well. I introduced myself and explained that I was following up from palliative care. I explained that I am aware of all that she has  been through during her hospital stay. I explained that she  has done well from all of her complications but there continue to be many barriers to improvement. I explained to her that we are here to support her through these barriers and although we hope for continued improvement we know that this may not be the case. I encouraged her to consider her wishes and what she would want specifically in event of another cardiac arrest. She indicates that she does not know what she wants. I explained that I am hopeful this does not happen but encouraged her to consider her wishes. She agrees.   All questions/concerns addressed. Emotional support provided.   Recommendations/Plan:  Follow up [place] conversation. Family meeting already in process.   Goals of Care and Additional Recommendations:  Limitations on Scope of Treatment: Full Scope Treatment  Code Status:  Full code  Prognosis:   Unable to determine  Discharge Planning:  To Be Determined  Thank you for allowing the Palliative Medicine Team to assist in the care of this patient.   Total Time 35 min Prolonged Time Billed  no       Greater than 50%  of this time was spent counseling and coordinating care related to the above assessment and plan.  Vinie Sill, NP Palliative Medicine Team Pager # [phone] (M-F 8a-5p) Team Phone # [phone] (Nights/Weekends)

## 2018-11-01 NOTE — Progress Notes (Signed)
NAME:  Kathleen [name], MRN:  [id number], DOB:  [birth year], LOS: 50 ADMISSION DATE:  09/22/2018, CONSULTATION DATE: 09/24/2018 REFERRING MD: Candee Furbish MD, CHIEF COMPLAINT: Cardiac arrest  Brief History   68 y/o obese woman with chronic diastolic heart failure who failed outpatient diuretics management due to chronic kidney disease.  Right heart cath on 5/29 showed markedly elevated biventricular filling pressures with normal cardiac output.  She was on cardiology service, maintained on milrinone and Lasix.  On the morning of 5/30 she went into PEA arrest, CPR performed for 8 minutes.  Intubated and transferred to ICU.  Started on CRRT 5/31.  Extubated 6/3.  Re-intubated early am 6/11 for hypercarbic respiratory failure and underwent tracheostomy on 6/19  Past Medical History   has a past medical history of Atrial flutter ([place]), Bradycardia, Chronic diastolic (congestive) heart failure ([place]) (01/10/2015), CKD (chronic kidney disease), stage IV ([place]), Degenerative joint disease of hand, Diabetes mellitus, Dyslipidemia, Fecal occult blood test positive, GERD (gastroesophageal reflux disease), Headache, Hypertension, Inadequate material resources, Irritable bowel syndrome, Morbid obesity ([place]), Obesity hypoventilation syndrome ([place]), Post-menopausal bleeding, and Shortness of breath dyspnea.  Significant Hospital Events   5/28 Admit 5/29 RHC 5/30 PEA arrest, intubated/ CRRT 5/31 Milrinone stopped due to ectopy; brady episode- amio stopped 6/02 Attempt at SBT, chest x-ray with worsening pulmonary edema, CVVHD for volume removal 6/03 Extubated > remain on CRRT.  Refused nocturnal BiPAP 6/04 Refused nocturnal BiPAP 6/10 off CRRT 6/11 Intubated early am with hypercarbia, concern for RUL PNA; abx broadened; restarted on CRRT 6/16  No events overnight, weaning on high PS 6/17  failed vent wean, remains on levophed, midodrine and CRRT 6/18  Weaned for 5 hours on PSV 8/5 before requiring full support;  ongoing CRRT, remains on levophed 14 mcg 6/19 - TRACH + Dr Erskine Emery 6/20 -. On IV heparin gt, levophed gtt with midodrine. Off fent gtt. Pressopr needs down 6/26 - TCT tolerated for the past 24 hours  6/27 - Stable on TCT now >48hrs, tolerated first session of iHD  6/30 hypotension / bradycardia overnight, started on dopamine 7/2 Placed on ATC at 40% fiO2 7/6 Palliative care consult  Consults:  Cardiology PCCM  Nephrology  EP  Procedures:  Lt PICC 5/29 >> 6/30 OETT 5/30 >> 6/3; 6/11 > 6/19 - TRACH (Dr Erskine Emery) >> R IJ HD cath 5/30 >> Aline left ulnar 5/30 >> 6/2  Significant Diagnostic Tests:  TTE 5/28 >>The left ventricle has normal systolic function with an ejection fraction of 60-65%. . The right ventricle has normal systolic function. The cavity was mildly enlarged.   Right heart cath 5/29 RA = 24 RV = 94/26 PA = 95/36 (53) PCW = 30 (v = 50) Fick cardiac output/index = 6.0/2.7 PVR =3.9 WU FA sat = 91% PA sat = 58%, 58% SVC 60%  .   Micro Data:  SARS coronavirus 2 cepheid 5/28 >> neg MRSA PCR 5/28 >> neg Trach aspirate 6/2 >> MSSA BCx2 6/2 >> negative ...................... Tracheal aspirate 6/11 >> few GNR >> few proteus and few candida tropicalis BCx2 6/11 >> negative 10/27/2018 tracheal aspirate>> gram-positive cocci gram-negative rods>>  Antimicrobials:  Vancomycin 6/3 > 6/4 Cefazolin 6/4 >> 6/10 ...........................Marland Kitchen Vanco 6/11 >> 6/17 Cefepime 6/11 >>6/18 Cefepime 10/27/2018>> 7/6  Interim history/subjective:  On 7 of dopamine. Palliative consult noted - family would like to visit 7/9 for goals of care meeting.  Objective   Blood pressure (!) 120/92, pulse (!) 55, temperature 98.7 F (37.1  C), temperature source Rectal, resp. rate 20, height 5' (1.524 m), weight 107.3 kg, SpO2 98 %.    Vent Mode: PSV;CPAP FiO2 (%):  [40 %] 40 % Set Rate:  [15 bmp-20 bmp] 15 bmp Vt Set:  [360 mL-490 mL] 490 mL PEEP:  [5 cmH20] 5 cmH20 Pressure Support:   [10 HDQ22-29 cmH20] 10 cmH20 Plateau Pressure:  [12 cmH20-22 cmH20] 22 cmH20   Intake/Output Summary (Last 24 hours) at 11/01/2018 0814 Last data filed at 11/01/2018 0700 Gross per 24 hour  Intake 1769.23 ml  Output 2500 ml  Net -730.77 ml   Filed Weights   10/31/18 0430 10/31/18 1050 11/01/18 0400  Weight: 109.8 kg 107.3 kg 107.3 kg    GEN: pleasant elderly woman on vent, in NAD HEENT: trach in place, minimal secretions CV: bradycardic, ext warm PULM: Diminished in bases, otherwise clear GI: soft, +BS EXT: 1+ edema NEURO: moves all 4 ext briskly to command PSYCH: nodding to questions appropriately SKIN: no rashes     Assessment & Plan:    HD dependent renal failure. - Continue HD per nephrology.  Persistent resp failure now trach'd. - Continue vent support with daily weaning trials and goal ATC.  Proteus colonization vs. Bronchitis - s/p 5 day course cefepime. -  Monitor off abx.  Persistent shock after PEA arrest. Bradycardia - appears dopamine dependent. - Continue florinef and midodrine as ordered. - Continue weaning attempts of dopamine. - Cards / EP following, to consider TVP (and later RV micro device) depending on whether BP drop corresponds with HR drop when dopamine is weaned. - For completeness sake, will repeat TSH and free T4 (TSH 7.2 in May 2020). Consider initiating synthroid and assess if any effect on HR.  Nutrition. - Continue TF's. - Needs a PEG at some point.  Goals of care. - Palliative consult placed and appreciated, lots of family moral distress at this time.  Best practice:  Diet: Continue tube feeds Pain/Anxiety/Delirium protocol (if indicated): NA VAP protocol (if indicated): In place  DVT prophylaxis: None GI prophylaxis: Pepcid Glucose control: SSI Mobility: Bedrest Code Status: Full Disposition: ICU    Montey Hora, PA - [facility] Pager: [phone] - [phone].  If no answer, [phone]  11/01/2018, 8:14 AM

## 2018-11-01 NOTE — Progress Notes (Signed)
Inpatient Diabetes Program Recommendations  AACE/ADA: New Consensus Statement on Inpatient Glycemic Control (2015)  Target Ranges:  Prepandial:   less than 140 mg/dL      Peak postprandial:   less than 180 mg/dL (1-2 hours)      Critically ill patients:  140 - 180 mg/dL   Lab Results  Component Value Date   GLUCAP 165 (H) 11/01/2018   HGBA1C 7.4 (A) 09/13/2018    Review of Glycemic Control Results for Kathleen, [name] (MRN [id number]) as of 11/01/2018 11:30  Ref. Range 10/31/2018 20:35 11/01/2018 00:08 11/01/2018 04:03 11/01/2018 07:42  Glucose-Capillary Latest Ref Range: 70 - 99 mg/dL 225 (H) 238 (H) 221 (H) 165 (H)   Current orders for Inpatient glycemic control:  Levemir 40 units bid Novolog 0-20 units q4  Vital 45 ml/hr @ 30 ml  Inpatient Diabetes Program Recommendations:    Noted glucose trends increased in the 200's after Tube feed rate increased.   Patient critically ill vented still. Consider utilizing ICU glycemic control order set Phase 1 SQ insulin RESISTANT 3-9 units Q4 hours.   May need to consider Tube Feed Coverage Novolog 4 units Q4.  Thanks, Bronson Curb, MSN, RNC-OB Diabetes Coordinator [phone] (8a-5p)

## 2018-11-01 NOTE — Progress Notes (Signed)
[facility] Progress Note    Assessment/ Plan:   1.  Acute kidney injury Superimposed on CKD IV (baseline 1.7-1.9) likely ATN secondary to shock, anuric: Transitioned to iHD on 6/27. Using right TDC.  She is on midodrine for hypotension at 20 TID and florinef 0.2 mg daily added 7/4.  HD 7/6.  Continue supportive care at this present time. Next planned HD 7/8.  Will try for healthy UF goal given hyponatremia.  2.  Proteus +respiratory culture: on cefepime, to end 7/8 per PCCM.    3.  Chronic respiratory failure: Status post tracheostomy.  Currently on vent, per pulmonary.  4.  Acute on chronic diastolic heart failure: Appreciate the expertise of the heart failure team.  Plans to try to correlate bradycardia with hypotension to see if could/ would be a candidate for TVP/ RV micro device.    5.  Anemia due to chronic disease and acute blood loss: Continue ESA, Increase Aranesp to 200 mcg for 7/8, on a course of IV iron.    6.  Bradycardia, PEA cardiac arrest: On dopamine now.  Seen by EP team, patient is not a candidate for permanent pacemaker EP intervention.   AHF following as above  7.  Dispo: remains in ICU.  Palliative care discussion noted.  Dispo now for LTAC if can come off dopamine.  Subjective:    No acute events.  Palliative care consult noted.  Greatly appreciate assistance.  Pt has no needs expressed this AM.  Is holding her fan.    Objective:   BP 125/60   Pulse 60   Temp 98.7 F (37.1 C) (Rectal)   Resp (!) 25   Ht 5' (1.524 m)   Wt 107.3 kg   SpO2 99%   BMI 46.20 kg/m   Intake/Output Summary (Last 24 hours) at 11/01/2018 1005 Last data filed at 11/01/2018 0900 Gross per 24 hour  Intake 1763.5 ml  Output 2500 ml  Net -736.5 ml   Weight change: -2.5 kg  Physical Exam: Gen: obese, lying in bed, NAD, conversant/ awake/ alert HEENT: trach in place, on vent CVS: RRR right now Resp:dim breath sounds bilaterally Abd: obese Ext: trace pitting  edema ACCESS: R IJ TDC  Imaging: No results found.  Labs: BMET Recent Labs  Lab 10/26/18 0402 10/27/18 0409 10/28/18 0228 10/29/18 0247 10/30/18 0353 10/31/18 0521 11/01/18 0408  NA 133* 134* 132* 132* 131* 128* 129*  K 4.5 4.1 4.4 3.8 3.7 3.9 3.7  CL 98 96* 94* 97* 95* 92* 94*  CO2 24 26 25 25 22  20* 22  GLUCOSE 262* 220* 140* 250* 182* 250* 253*  BUN 69* 37* 55* 45* 70* 91* 55*  CREATININE 4.13* 2.90* 4.04* 3.03* 4.06* 4.98* 3.48*  CALCIUM 8.4* 8.4* 8.8* 8.7* 8.6* 8.6* 8.2*  PHOS 2.6 2.2* 3.2 3.0 4.0 4.0 2.6   CBC Recent Labs  Lab 10/29/18 0247 10/30/18 0353 10/31/18 0521 11/01/18 0408  WBC 6.7 7.3 8.5 8.1  HGB 8.4* 8.2* 8.2* 8.0*  HCT 28.5* 28.4* 27.9* 27.5*  MCV 95.6 95.9 94.6 95.5  PLT 102* 97* 101* 117*    Medications:    . aspirin  81 mg Per Tube Daily  . atorvastatin  40 mg Per Tube q1800  . chlorhexidine gluconate (MEDLINE KIT)  15 mL Mouth Rinse BID  . Chlorhexidine Gluconate Cloth  6 each Topical Daily  . Chlorhexidine Gluconate Cloth  6 each Topical Daily  . Chlorhexidine Gluconate Cloth  6 each Topical Q0600  .  darbepoetin (ARANESP) injection - DIALYSIS  100 mcg Intravenous Q Wed-HD  . feeding supplement (PRO-STAT SUGAR FREE 64)  30 mL Per Tube TID  . fludrocortisone  0.2 mg Oral Daily  . guaiFENesin  15 mL Per Tube Q6H  . insulin aspart  0-20 Units Subcutaneous Q4H  . insulin detemir  40 Units Subcutaneous BID  . mouth rinse  15 mL Mouth Rinse 10 times per day  . midodrine  20 mg Per Tube TID WC  . polyethylene glycol  17 g Oral Daily  . sodium chloride flush  10-40 mL Intracatheter Q12H      Madelon Lips, MD [facility] pgr [phone] 11/01/2018, 10:05 AM

## 2018-11-01 NOTE — Progress Notes (Addendum)
Nutrition Follow-up  DOCUMENTATION CODES:   Morbid obesity  INTERVENTION:   Continue tube feeding:  -Vital 1.5 @45ml  (1057ml)via Cortrak -30 ml ProstatTID  Provides: 1920kcal, 118grams protein,858ml free water. Meets 100% ofkcal needs and 100% of protein needs.  Once tube feeding runs out start:  -Osmolite 1.5 @ 45 ml/hr (1080 ml) via Cortrak -30 ml Prostat TID  Provides: 1920kcal, 113grams protein,820ml free water. Meets 100% ofkcal needs and 100% of protein needs.  NUTRITION DIAGNOSIS:   Inadequate oral intake related to inability to eat as evidenced by NPO status.  Ongoing  GOAL:   Provide needs based on ASPEN/SCCM guidelines  Addressed via TF  MONITOR:   Vent status, Labs, Weight trends, I & O's, TF tolerance  REASON FOR ASSESSMENT:   Consult Enteral/tube feeding initiation and management  ASSESSMENT:   68 year old with morbid obesity, acute on chronic diastolic heart failure, pulmonary hypertension transferred to the ICU after PEA arrest.  Suspect respiratory failure due to pulmonary edema   5/30 - s/p PEA arrest, intubated and transferred to ICU,CRRTinitiated 6/2- failed SBT, pulm edema 6/3 extubated, refused BiPAP 6/10- CRRT stopped  6/11- re-intubated, CRRT re-started 6/19- failed SBT, s/p trach  6/27- transition iHD  Pt discussed during ICU rounds and with RN.   Pt continues to require dopamine. Placed back on ventilation support with daily weaning trials. Palliative following for GOC, further discussions to be held on 7/9. Pt may need PEG if long term nutrition support is desired. Tolerating Vital 1.5 @ 45 ml/hr + 30 ml Prostat daily. Will transition to standard formula today.   Admission weight: 134.8 kg Current weight107.3kg (upfrom 105.5kg on 6/30)  Patient requiring ventilator support via trach MV: 9 L/min Temp (24hrs), Avg:98.5 F (36.9 C), Min:98.1 F (36.7 C), Max:98.7 F (37.1 C)   I/O: +9.443 ml since  6/23 UOP: 0 ml x 24 hrs Last HD 7/6: 2500 ml net UF  Drips: dopamine Medications: aranesp, SS novolog, levemir, miralax Labs: Na 129 (L) Creatinine 4.48- trending down CBG 153-238  Diet Order:   Diet Order            Diet NPO time specified  Diet effective now              EDUCATION NEEDS:   No education needs have been identified at this time  Skin:  Skin Assessment: Skin Integrity Issues: Skin Integrity Issues:: Incisions Incisions: R neck/chest Other: MASD- bilateral breast, abdomen, back    Blister- R hip    Skin tear- breast  Last BM:  7/6  Height:   Ht Readings from Last 1 Encounters:  10/05/18 5' (1.524 m)    Weight:   Wt Readings from Last 1 Encounters:  11/01/18 107.3 kg    Ideal Body Weight:  45.5 kg  BMI:  Body mass index is 46.2 kg/m.  Estimated Nutritional Needs:   Kcal:  1800-2000 kcal  Protein:  100-120 grams  Fluid:  >/= 1.7 L/day   Mariana Single RD, LDN Clinical Nutrition Pager # - [phone]

## 2018-11-01 NOTE — Progress Notes (Signed)
SLP Cancellation Note  Patient Details Name: ANETRA CZERWINSKI MRN: [id number] DOB: 1950-11-08   Cancelled treatment:       Reason Eval/Treat Not Completed: Medical issues which prohibited therapy. Pt remains on the vent, pulling low volumes per RN. Will continue to follow along for readiness to resume PMV trials.   [name] Wayne Brunker 11/01/2018, 1:53 PM  Pollyann Glen, M.A. [facility] Environmental education officer [phone] Office [phone]

## 2018-11-01 NOTE — Evaluation (Addendum)
Occupational Therapy Evaluation Patient Details Name: Kathleen [name] MRN: [id number] DOB: [birth year] Today's Date: 11/01/2018    History of Present Illness Kathleen [name] is a 68 y.o. female with history of permanent AF, hypertension, super morbid obesity, DM2, dyslipidemia, obesity hypoventilation syndrome (on home O2 with no OSA by prior PSG but showed nocturnal hypoxemia), CKD stage IV (baseline creatinine about 1.9), chronic diastolic CHF.  Admit for fluid overload. Also with pulmonary HTN.  PEA arrest 5/30, transfer to the ICU, extubater 6/3.  Intubated 10/06/18.  Trach on 10/14/18.     Clinical Impression   PTA patient independent.  Admitted for above and limited by problem list below, including decreased activity tolerance, generalized weakness, impaired balance from prolonged and complicated hospital stay.  Pt eval completed at bed level today, pt positioned into chair position and engaged in self care tasks.  She fatigues easily, requires min assist for grooming, mod assist for UB ADLs and max-total assist for LB ADLs.  She will require +2 assist for any EOB or OOB mobility.  Cognitively, she is able to follow commands with increased time and presents with slow processing; continued assessment recommended (as pt on trach/vent).  Pt on vent via trach today: PEEP 5, 40% FiO2, SPO2 95%, RR 15, BP 120/48.  Positioned pt in chair position upon exiting room, HOB at 62 degrees with pillows supporting R side to prevent lateral lean. She will benefit from continued OT services while admitted and after dc at [facility] level in order to decrease burden of care and optimize independence with ADLs/mobility.     Follow Up Recommendations  LTACH;Supervision/Assistance - 24 hour    Equipment Recommendations  3 in 1 bedside commode    Recommendations for Other Services       Precautions / Restrictions Precautions Precautions: Fall Restrictions Weight Bearing Restrictions: No      Mobility Bed  Mobility Overal bed mobility: Needs Assistance             General bed mobility comments: pt repositioned into chair position in bed up to 62 degrees HOB elevation  Transfers                      Balance Overall balance assessment: Needs assistance Sitting-balance support: Bilateral upper extremity supported Sitting balance-Leahy Scale: Poor Sitting balance - Comments: relaint on UE support while seated supported or long sitting in bed                                   ADL either performed or assessed with clinical judgement   ADL Overall ADL's : Needs assistance/impaired Eating/Feeding: NPO   Grooming: Minimal assistance   Upper Body Bathing: Minimal assistance;Bed level   Lower Body Bathing: Maximal assistance;Bed level   Upper Body Dressing : Moderate assistance;Sitting(chair position in bed)   Lower Body Dressing: Total assistance;+2 for physical assistance;Bed level     Toilet Transfer Details (indicate cue type and reason): deferred            General ADL Comments: limited to bed level, repositioned in chair position but pt declined further mobility     Vision   Vision Assessment?: No apparent visual deficits     Perception     Praxis      Pertinent Vitals/Pain Pain Assessment: No/denies pain     Hand Dominance Right   Extremity/Trunk Assessment Upper Extremity Assessment Upper Extremity Assessment:  Generalized weakness(noted bil tremors- pt reports baseline)   Lower Extremity Assessment Lower Extremity Assessment: Defer to PT evaluation       Communication Communication Communication: No difficulties   Cognition Arousal/Alertness: Awake/alert Behavior During Therapy: WFL for tasks assessed/performed Overall Cognitive Status: Difficult to assess                                 General Comments: pt following commands with increased time, some slow processing - continue assessment    General  Comments  pt on vent/trach: PEEP 5, 40% FiO2, HR 51-70, SpO2 95%, RR 15, BP 120/48    Exercises Exercises: Other exercises Other Exercises Other Exercises: chair position in bed, worked on foward lean with trunk- pt requires pulling on therapist with B UEs to sit unsupported to 10 second trials x 3 reps before fatiguing    Shoulder Instructions      Home Living Family/patient expects to be discharged to:: Private residence Living Arrangements: Alone Available Help at Discharge: Family;Available PRN/intermittently(sister lives close by ) Type of Home: Mobile home Home Access: Stairs to enter [employer] of Steps: long ramp type entry with 15 steps with walking space in between them   Home Layout: One level     Bathroom Shower/Tub: Occupational psychologist: Handicapped height     Home Equipment: [place] - single point;Grab bars - tub/shower;Walker - 4 wheels          Prior Functioning/Environment Level of Independence: Independent with assistive device(s);Needs assistance  Gait / Transfers Assistance Needed: used cane PTA.  Niece recently bought pt a rollator ADL's / Homemaking Assistance Needed: Did own ADLS, sister assisted with grocery shopping            OT Problem List: Decreased strength;Decreased activity tolerance;Impaired balance (sitting and/or standing);Decreased coordination;Decreased cognition;Decreased safety awareness;Decreased knowledge of use of DME or AE;Decreased knowledge of precautions;Obesity;Cardiopulmonary status limiting activity      OT Treatment/Interventions: Self-care/ADL training;Therapeutic exercise;DME and/or AE instruction;Therapeutic activities;Patient/family education;Balance training;Cognitive remediation/compensation    OT Goals(Current goals can be found in the care plan section) Acute Rehab OT Goals Patient Stated Goal: to go home OT Goal Formulation: With patient Time For Goal Achievement: 11/15/18 Potential to  Achieve Goals: Fair  OT Frequency: Min 2X/week   Barriers to D/C:            Co-evaluation              AM-PAC OT "6 Clicks" Daily Activity     Outcome Measure Help from another person eating meals?: Total Help from another person taking care of personal grooming?: A Little Help from another person toileting, which includes using toliet, bedpan, or urinal?: Total Help from another person bathing (including washing, rinsing, drying)?: A Lot Help from another person to put on and taking off regular upper body clothing?: A Lot Help from another person to put on and taking off regular lower body clothing?: Total 6 Click Score: 10   End of Session Nurse Communication: Mobility status;Other (comment)(chair position in bed)  Activity Tolerance: Patient tolerated treatment well Patient left: in bed;with call bell/phone within reach;with bed alarm set  OT Visit Diagnosis: Other abnormalities of gait and mobility (R26.89);Muscle weakness (generalized) (M62.81)                Time: 6606-3016 OT Time Calculation (min): 15 min Charges:  OT General Charges $OT Visit: 1 Visit OT Evaluation $  OT Eval Moderate Complexity: 1 Mod  [name], [place] Acute Rehabilitation Services Pager [phone] Office [phone]    [name] 11/01/2018, 12:36 PM

## 2018-11-01 NOTE — Progress Notes (Signed)
Orthopedic Tech Progress Note Patient Details:  Kathleen [name] [birth year] [id number]  Ortho Devices Type of Ortho Device: Haematologist Ortho Device/Splint Location: bi-lateral Ortho Device/Splint Interventions: Ordered, Application, Adjustment   Post Interventions Patient Tolerated: Well Instructions Provided: Care of device, Adjustment of device   [name] 11/01/2018, 10:15 PM

## 2018-11-02 DIAGNOSIS — Z7189 Other specified counseling: Secondary | ICD-10-CM

## 2018-11-02 LAB — CBC
HCT: 27 % — ABNORMAL LOW (ref 36.0–46.0)
Hemoglobin: 8 g/dL — ABNORMAL LOW (ref 12.0–15.0)
MCH: 28.2 pg (ref 26.0–34.0)
MCHC: 29.6 g/dL — ABNORMAL LOW (ref 30.0–36.0)
MCV: 95.1 fL (ref 80.0–100.0)
Platelets: 138 10*3/uL — ABNORMAL LOW (ref 150–400)
RBC: 2.84 MIL/uL — ABNORMAL LOW (ref 3.87–5.11)
RDW: 28.2 % — ABNORMAL HIGH (ref 11.5–15.5)
WBC: 7.5 10*3/uL (ref 4.0–10.5)
nRBC: 0 % (ref 0.0–0.2)

## 2018-11-02 LAB — GLUCOSE, CAPILLARY
Glucose-Capillary: 207 mg/dL — ABNORMAL HIGH (ref 70–99)
Glucose-Capillary: 207 mg/dL — ABNORMAL HIGH (ref 70–99)
Glucose-Capillary: 129 mg/dL — ABNORMAL HIGH (ref 70–99)
Glucose-Capillary: 172 mg/dL — ABNORMAL HIGH (ref 70–99)

## 2018-11-02 LAB — RENAL FUNCTION PANEL
Albumin: 2.1 g/dL — ABNORMAL LOW (ref 3.5–5.0)
Anion gap: 13 (ref 5–15)
BUN: 72 mg/dL — ABNORMAL HIGH (ref 8–23)
CO2: 21 mmol/L — ABNORMAL LOW (ref 22–32)
Calcium: 8.3 mg/dL — ABNORMAL LOW (ref 8.9–10.3)
Chloride: 94 mmol/L — ABNORMAL LOW (ref 98–111)
Creatinine, Ser: 4.42 mg/dL — ABNORMAL HIGH (ref 0.44–1.00)
GFR calc Af Amer: 11 mL/min — ABNORMAL LOW (ref >60)
GFR calc non Af Amer: 10 mL/min — ABNORMAL LOW (ref >60)
Glucose, Bld: 266 mg/dL — ABNORMAL HIGH (ref 70–99)
Phosphorus: 3.7 mg/dL (ref 2.5–4.6)
Potassium: 3.8 mmol/L (ref 3.5–5.1)
Sodium: 128 mmol/L — ABNORMAL LOW (ref 135–145)

## 2018-11-02 MED ORDER — HEPARIN SODIUM (PORCINE) 1000 UNIT/ML IJ SOLN
INTRAMUSCULAR | Status: AC
  Filled 2018-11-02: qty 2

## 2018-11-02 MED ORDER — DARBEPOETIN ALFA 100 MCG/0.5ML IJ SOSY
100.0000 ug | PREFILLED_SYRINGE | INTRAMUSCULAR | Status: DC
  Filled 2018-11-02: qty 0.5

## 2018-11-02 MED ORDER — HEPARIN SODIUM (PORCINE) 1000 UNIT/ML IJ SOLN
INTRAMUSCULAR | Status: AC
  Filled 2018-11-02: qty 4

## 2018-11-02 NOTE — Progress Notes (Signed)
[facility] Progress Note    Assessment/ Plan:   1.  Acute kidney injury Superimposed on CKD IV (baseline 1.7-1.9) likely ATN secondary to shock, anuric: Transitioned to iHD on 6/27. Using right [facility] and on MWF schedule.  She is on midodrine for hypotension at 20 TID and florinef 0.2 mg daily added 7/4.  HD 7/6, 7/8 today.  Continue supportive care at this present time. Will try for healthy UF goal given hyponatremia.  2.  Proteus +respiratory culture: on cefepime, to end 7/8 per PCCM.    3.  Chronic respiratory failure: Status post tracheostomy.  Currently on vent, per pulmonary.  4.  Acute on chronic diastolic heart failure: Appreciate the expertise of the heart failure team.  Plans to try to correlate bradycardia with hypotension to see if could/ would be a candidate for TVP/ RV micro device; not felt to be a candidate for PPM per EP team.    5.  Anemia due to chronic disease and acute blood loss: Continue ESA, Increase Aranesp to 200 mcg for 7/8, on a course of IV iron.    6.  Bradycardia, PEA cardiac arrest: Dopamine dependent now.  As above in #4.  7.  Dispo: remains in ICU.  Palliative care discussion noted and greatly appreciated.  Subjective:    On HD this AM.  Tolerating well.  BP remains low.  On dopamine at 5 with bradycardia in the 50s.      Objective:   BP (!) 108/44 (BP Location: Left Wrist)   Pulse (!) 56   Temp 97.9 F (36.6 C) (Oral)   Resp 16   Ht 5' (1.524 m)   Wt 107.3 kg   SpO2 100%   BMI 46.20 kg/m   Intake/Output Summary (Last 24 hours) at 11/02/2018 0950 Last data filed at 11/02/2018 0900 Gross per 24 hour  Intake 1418.23 ml  Output 0 ml  Net 1418.23 ml   Weight change:   Physical Exam: Gen: obese, lying in bed, NAD, conversant/ awake/ alert HEENT: trach in place, on vent CVS: RRR right now Resp:dim breath sounds bilaterally Abd: obese Ext: legs wrapped in [place]na boots today ACCESS: R IJ TDC  Imaging: No results  found.  Labs: BMET Recent Labs  Lab 10/27/18 0409 10/28/18 0228 10/29/18 0247 10/30/18 0353 10/31/18 0521 11/01/18 0408 11/02/18 0343  NA 134* 132* 132* 131* 128* 129* 128*  K 4.1 4.4 3.8 3.7 3.9 3.7 3.8  CL 96* 94* 97* 95* 92* 94* 94*  CO2 _0 20* 22 21*  GLUCOSE 220* 140* 250* 182* 250* 253* 266*  BUN 37* 55* 45* 70* 91* 55* 72*  CREATININE 2.90* 4.04* 3.03* 4.06* 4.98* 3.48* 4.42*  CALCIUM 8.4* 8.8* 8.7* 8.6* 8.6* 8.2* 8.3*  PHOS 2.2* 3.2 3.0 4.0 4.0 2.6 3.7   CBC Recent Labs  Lab 10/30/18 0353 10/31/18 0521 11/01/18 0408 11/02/18 0343  WBC 7.3 8.5 8.1 7.5  HGB 8.2* 8.2* 8.0* 8.0*  HCT 28.4* 27.9* 27.5* 27.0*  MCV 95.9 94.6 95.5 95.1  PLT 97* 101* 117* 138*    Medications:    . aspirin  81 mg Per Tube Daily  . atorvastatin  40 mg Per Tube q1800  . chlorhexidine gluconate (MEDLINE KIT)  15 mL Mouth Rinse BID  . Chlorhexidine Gluconate Cloth  6 each Topical Daily  . Chlorhexidine Gluconate Cloth  6 each Topical Daily  . Chlorhexidine Gluconate Cloth  6 each Topical Q0600  . darbepoetin (ARANESP) injection - DIALYSIS  100 mcg Intravenous Q Wed-HD  . feeding supplement (PRO-STAT SUGAR FREE 64)  30 mL Per Tube TID  . fludrocortisone  0.2 mg Oral Daily  . guaiFENesin  15 mL Per Tube Q6H  . heparin      . heparin      . insulin aspart  0-20 Units Subcutaneous Q4H  . insulin detemir  40 Units Subcutaneous BID  . mouth rinse  15 mL Mouth Rinse 10 times per day  . midodrine  20 mg Per Tube TID WC  . polyethylene glycol  17 g Oral Daily  . sodium chloride flush  10-40 mL Intracatheter Q12H       , MD [facility] pgr [phone] 11/02/2018, 9:50 AM  

## 2018-11-02 NOTE — Progress Notes (Signed)
NAME:  Kathleen [name], MRN:  [id number], DOB:  08/19/1950, LOS: 46 ADMISSION DATE:  09/22/2018, CONSULTATION DATE: 09/24/2018 REFERRING MD: Candee Furbish MD, CHIEF COMPLAINT: Cardiac arrest  Brief History   68 y/o obese woman with chronic diastolic heart failure who failed outpatient diuretics management due to chronic kidney disease.  Right heart cath on 5/29 showed markedly elevated biventricular filling pressures with normal cardiac output.  She was on cardiology service, maintained on milrinone and Lasix.  On the morning of 5/30 she went into PEA arrest, CPR performed for 8 minutes.  Intubated and transferred to ICU.  Started on CRRT 5/31.  Extubated 6/3.  Re-intubated early am 6/11 for hypercarbic respiratory failure and underwent tracheostomy on 6/19  Past Medical History   has a past medical history of Atrial flutter ([place]), Bradycardia, Chronic diastolic (congestive) heart failure ([place]) (01/10/2015), CKD (chronic kidney disease), stage IV ([place]), Degenerative joint disease of hand, Diabetes mellitus, Dyslipidemia, Fecal occult blood test positive, GERD (gastroesophageal reflux disease), Headache, Hypertension, Inadequate material resources, Irritable bowel syndrome, Morbid obesity ([place]), Obesity hypoventilation syndrome ([place]), Post-menopausal bleeding, and Shortness of breath dyspnea.  Significant Hospital Events   5/28 Admit 5/29 RHC 5/30 PEA arrest, intubated/ CRRT 5/31 Milrinone stopped due to ectopy; brady episode- amio stopped 6/02 Attempt at SBT, chest x-ray with worsening pulmonary edema, CVVHD for volume removal 6/03 Extubated > remain on CRRT.  Refused nocturnal BiPAP 6/04 Refused nocturnal BiPAP 6/10 off CRRT 6/11 Intubated early am with hypercarbia, concern for RUL PNA; abx broadened; restarted on CRRT 6/16  No events overnight, weaning on high PS 6/17  failed vent wean, remains on levophed, midodrine and CRRT 6/18  Weaned for 5 hours on PSV 8/5 before requiring full support;  ongoing CRRT, remains on levophed 14 mcg 6/19 - TRACH + Dr Erskine Emery 6/20 -. On IV heparin gt, levophed gtt with midodrine. Off fent gtt. Pressopr needs down 6/26 - TCT tolerated for the past 24 hours  6/27 - Stable on TCT now >48hrs, tolerated first session of iHD  6/30 hypotension / bradycardia overnight, started on dopamine 7/2 Placed on ATC at 40% fiO2 7/6 Palliative care consult/ iHD 7/7 remains dependent on dopamine at 7, for bradycardia/ hypotension.   Palliative consult noted - family would like to visit 7/9 for goals of care meeting.  Consults:  Cardiology PCCM  Nephrology  EP  Procedures:  Lt PICC 5/29 >> 6/30 OETT 5/30 >> 6/3; 6/11 > 6/19 - TRACH (Dr Erskine Emery) >> R IJ HD cath 5/30 >> Aline left ulnar 5/30 >> 6/2  Significant Diagnostic Tests:  TTE 5/28 >>The left ventricle has normal systolic function with an ejection fraction of 60-65%. . The right ventricle has normal systolic function. The cavity was mildly enlarged.   Right heart cath 5/29 RA = 24 RV = 94/26 PA = 95/36 (53) PCW = 30 (v = 50) Fick cardiac output/index = 6.0/2.7 PVR =3.9 WU FA sat = 91% PA sat = 58%, 58% SVC 60%  Micro Data:  SARS coronavirus 2 cepheid 5/28 >> neg MRSA PCR 5/28 >> neg Trach aspirate 6/2 >> MSSA BCx2 6/2 >> negative ...................... Tracheal aspirate 6/11 >> few GNR >> few proteus and few candida tropicalis BCx2 6/11 >> negative 10/27/2018 tracheal aspirate>> gram-positive cocci gram-negative rods>>  Antimicrobials:  Vancomycin 6/3 > 6/4 Cefazolin 6/4 >> 6/10 ...........................Marland Kitchen Vanco 6/11 >> 6/17 Cefepime 6/11 >>6/18 Cefepime 10/27/2018>> 7/6  Interim history/subjective:  Afebrile Weaned 10/5 for 12 hours yesterday currently on  iHD Remains on dopamine 5 mcg/kg/min  Objective   Blood pressure (!) 87/62, pulse (!) 55, temperature 97.9 F (36.6 C), temperature source Oral, resp. rate 20, height 5' (1.524 m), weight 107.3 kg, SpO2 100 %.    Vent  Mode: PRVC FiO2 (%):  [40 %] 40 % Set Rate:  [15 bmp-20 bmp] 20 bmp Vt Set:  [360 mL-490 mL] 360 mL PEEP:  [5 cmH20] 5 cmH20 Pressure Support:  [10 cmH20] 10 cmH20 Plateau Pressure:  [7 cmH20-24 cmH20] 7 cmH20   Intake/Output Summary (Last 24 hours) at 11/02/2018 1008 Last data filed at 11/02/2018 0900 Gross per 24 hour  Intake 1406.34 ml  Output 0 ml  Net 1406.34 ml   Filed Weights   10/31/18 0430 10/31/18 1050 11/01/18 0400  Weight: 109.8 kg 107.3 kg 107.3 kg   General:  Chronically ill appearing female, pleasant, in NAD on MV HEENT: MM pink/moist, r nare cortrack, midline cuffed trach with some saturated dressing- minimal tracheal secretions Neuro: Alert, mouth words, f/c, MAE CV: RR- HR in 50's, TDC in right chest PULM: even/non-labored, clear, diminished in bases  GI: obese, soft, NT, +BS, no foley  Extremities: warm/dry, LE wrapped in dressings, RUE PICC site ok Skin: no rashes     Assessment & Plan:    HD dependent renal failure. - Continue HD per nephrology.  Persistent resp failure now trach'd. - Continue vent support with daily weaning trials and goal ATC. - trach care   Proteus colonization vs. Bronchitis - s/p 5 day course cefepime completed 7/6. -  Monitor off abx. PCT remains reassuring/ negative  Persistent shock after PEA arrest. Bradycardia - appears dopamine dependent. - Continue florinef and midodrine as ordered. - Continue weaning attempts of dopamine. - Cards / EP following, to consider TVP (and later RV micro device) depending on whether BP drop corresponds with HR drop when dopamine is weaned. -  repeat TSH and free T4 OK 7/7  Nutrition. - Continue TF's. - Needs a PEG at some point.  Goals of care. - Appreciate PMT assistance.  [place] meeting scheduled for 7/9   Best practice:  Diet: Continue tube feeds Pain/Anxiety/Delirium protocol (if indicated): NA VAP protocol (if indicated): In place  DVT prophylaxis: None GI prophylaxis: Pepcid  Glucose control: SSI Mobility: Bedrest Code Status: Full Disposition: ICU    CCT 30 mins  Kennieth Rad, MSN, AGACNP-BC [facility] Pgr: [phone] or if no answer [phone] 11/02/2018, 10:08 AM

## 2018-11-02 NOTE — Progress Notes (Signed)
Physical Therapy Treatment Patient Details Name: Kathleen [name] MRN: [id number] DOB: May 12, 1950 Today's Date: 11/02/2018    History of Present Illness Kathleen [name] is a 68 y.o. female with history of permanent AF, hypertension, super morbid obesity, DM2, dyslipidemia, obesity hypoventilation syndrome (on home O2 with no OSA by prior PSG but showed nocturnal hypoxemia), CKD stage IV (baseline creatinine about 1.9), chronic diastolic CHF.  Admit for fluid overload. Also with pulmonary HTN.  PEA arrest 5/30, transfer to the ICU, extubater 6/3.  Intubated 10/06/18.  Trach on 10/14/18.      PT Comments    Pt admitted with above diagnosis. Pt currently with functional limitations due to balance and endurance deficits. Pt still very cognitively impaired which hinders her ability to follow commands.  Pt was able to come to EOB with max assist of 2 and PT moved pt to the recliner with use of Stedy.  Slow progress due to weakness, anxiety and poor cognition.  Pt will benefit from skilled PT to increase their independence and safety with mobility to allow discharge to the venue listed below.     Follow Up Recommendations  LTACH;Supervision/Assistance - 24 hour     Equipment Recommendations  None recommended by PT;Other (comment)(TBA)    Recommendations for Other Services       Precautions / Restrictions Precautions Precautions: Fall Precaution Comments: watch O2 sats; for session on vent today Restrictions Weight Bearing Restrictions: No    Mobility  Bed Mobility Overal bed mobility: Needs Assistance Bed Mobility: Supine to Sit     Supine to sit: Max assist     General bed mobility comments: Needs max assist to come to EOB due to incr anxiety with movement as well as decr following of commands due to cognitive delay  Transfers Overall transfer level: Needs assistance   Transfers: Sit to/from Stand;Stand Pivot Transfers Sit to Stand: Mod assist;+2 physical assistance;+2  safety/equipment;From elevated surface Stand pivot transfers: +2 physical assistance;Total assist       General transfer comment: Used STedy with pt needing mod assist to power up.  Bed was elevated and bed max inflated as she could not stand with out these provisions.  Once in [place] moved pt to chair.  Pt overall very slow to respond.  Slow processing of commands and difficulty with following commands.   Ambulation/Gait                 Stairs             Wheelchair Mobility    Modified Rankin (Stroke Patients Only)       Balance Overall balance assessment: Needs assistance Sitting-balance support: Bilateral upper extremity supported;Feet supported Sitting balance-Leahy Scale: Poor Sitting balance - Comments: reliant on UE support while seated supported    Standing balance support: Bilateral upper extremity supported;During functional activity Standing balance-Leahy Scale: Poor Standing balance comment: reliant on external support and UE support                            Cognition Arousal/Alertness: Awake/alert Behavior During Therapy: WFL for tasks assessed/performed Overall Cognitive Status: Difficult to assess                                 General Comments: pt following commands with increased time, some slow processing - continue assessment       Exercises General Exercises -  Lower Extremity Ankle Circles/Pumps: AROM;Both;10 reps;Supine Long Arc Quad: AROM;Both;10 reps;Seated    General Comments General comments (skin integrity, edema, etc.): Vent/trach 40% FiO2, PEEP 5, VSS with treatment      Pertinent Vitals/Pain Pain Assessment: Faces Faces Pain Scale: Hurts even more Pain Location: unable to state Pain Descriptors / Indicators: Grimacing Pain Intervention(s): Limited activity within patient's tolerance;Monitored during session;Repositioned    Home Living                      Prior Function             PT Goals (current goals can now be found in the care plan section) Acute Rehab PT Goals Patient Stated Goal: to go home Progress towards PT goals: Progressing toward goals    Frequency    Min 3X/week      PT Plan Current plan remains appropriate    Co-evaluation              AM-PAC PT "6 Clicks" Mobility   Outcome Measure  Help needed turning from your back to your side while in a flat bed without using bedrails?: A Lot Help needed moving from lying on your back to sitting on the side of a flat bed without using bedrails?: A Lot Help needed moving to and from a bed to a chair (including a wheelchair)?: Total Help needed standing up from a chair using your arms (e.g., wheelchair or bedside chair)?: A Lot Help needed to walk in hospital room?: Total Help needed climbing 3-5 steps with a railing? : Total 6 Click Score: 9    End of Session Equipment Utilized During Treatment: Gait belt;Oxygen(vent/trach) Activity Tolerance: Other (comment);Patient limited by fatigue(limited by anxiety) Patient left: in chair;with call bell/phone within reach;with chair alarm set Nurse Communication: Mobility status;Need for lift equipment PT Visit Diagnosis: Other abnormalities of gait and mobility (R26.89);Muscle weakness (generalized) (M62.81)     Time: 3244-0102 PT Time Calculation (min) (ACUTE ONLY): 26 min  Charges:  $Therapeutic Exercise: 8-22 mins $Therapeutic Activity: 8-22 mins                     [place] Pager:  [phone]  Office:  [place] 11/02/2018, 2:10 PM

## 2018-11-02 NOTE — Progress Notes (Signed)
Spoke with Palliative care NP. Planned family meeting for 10 am 11/03/18. Patient aware.

## 2018-11-02 NOTE — Progress Notes (Signed)
Anastasia called RN for update. Requested to do a video chat this afternoon. RN instructed family to call back when ready and we would set up the chat.

## 2018-11-02 NOTE — TOC Initial Note (Signed)
Transition of Care [facility]) - Initial/Assessment Note    Patient Details  Name: Kathleen [name] MRN: [id number] Date of Birth: [birth year]  Transition of Care [facility]) CM/SW Contact:    [name], RN Phone Number: 11/02/2018, 3:11 PM  Clinical Narrative:  CM following this patient for transition of care needs. Pt with a history of CKD, and acute on chronic heart failure exacerbation. PEA arrest intubated and initiated on CVVHD. Palliative is following the patient and next palliative meeting is 11-03-18 for goals of  Care. CM did reach out to [employer]  with [facility] to do a screen to see if patient is eligible-per liaison pt is eligible for [facility] bed. CM did not speak with patient regarding LTACH- will see what family decides during palliative meeting. CM will continue to monitor.            Expected Discharge Plan: Long Term Acute Care (LTAC) Barriers to Discharge: Continued Medical Work up   Patient Goals and CMS Choice     Choice offered to / list presented to : NA([facility] following)  Expected Discharge Plan and Services Expected Discharge Plan: Long Term Acute Care (LTAC) In-house Referral: NA Discharge Planning Services: CM Consult Post Acute Care Choice: Long Term Acute Care (LTAC) Living arrangements for the past 2 months: Single Family Home                  Prior Living Arrangements/Services Living arrangements for the past 2 months: Single Family Home Lives with:: Self              Current home services: DME    Activities of Daily Living Home Assistive Devices/Equipment: Cane (specify quad or straight), Eyeglasses, Dentures (specify type), Grab bars in shower, Hand-held shower hose, Oxygen, Scales, CBG Meter, Walker (specify type), Shower chair without back, Blood pressure cuff ADL Screening (condition at time of admission) Patient's cognitive ability adequate to safely complete daily activities?: Yes Is the patient deaf or have difficulty  hearing?: No Does the patient have difficulty seeing, even when wearing glasses/contacts?: Yes Does the patient have difficulty concentrating, remembering, or making decisions?: No Patient able to express need for assistance with ADLs?: Yes Does the patient have difficulty dressing or bathing?: No Independently performs ADLs?: Yes (appropriate for developmental age) Does the patient have difficulty walking or climbing stairs?: Yes Weakness of Legs: None Weakness of Arms/Hands: None  Permission Sought/Granted                  Emotional Assessment         Alcohol / Substance Use: Not Applicable Psych Involvement: No (comment)  Admission diagnosis:  Heart Failure Patient Active Problem List   Diagnosis Date Noted  . Acute respiratory failure ([place])   . CHF (congestive heart failure) ([place])   . Palliative care encounter   . Shock ([place])   . VAP (ventilator-associated pneumonia) ([place]) 10/23/2018  . Acute on chronic diastolic CHF (congestive heart failure) ([place]) 09/22/2018  . Acute kidney injury superimposed on chronic kidney disease ([place]) 09/22/2018  . Acute respiratory failure with hypoxia ([place]) 09/22/2018  . History of recent fall 09/06/2018  . Tinea pedis 08/31/2017  . GERD (gastroesophageal reflux disease) 08/24/2016  . Atrial flutter ([place]) 04/06/2016  . Mobitz type 1 second degree atrioventricular block 02/15/2015  . Chronic diastolic congestive heart failure ([place]) 01/10/2015  . CKD (chronic kidney disease) stage 3, GFR 30-59 ml/min (HCC)   . Chronic respiratory failure ([place]) 06/30/2013  .  ACE-inhibitor cough 09/29/2012  . Onychomycosis 10/06/2011  . Type 1 superior labral anterior-to-posterior (SLAP) tear of shoulder 07/02/2011  . Preventative health care 02/12/2011  . Hypertension 02/12/2011  . Peripheral neuropathy 09/06/2010  . OBESITY 11/13/2008  . Type 2 diabetes mellitus with other specified complication ([place]) 75/43/6067  . Dyslipidemia 11/06/2007   PCP:   Aldine Contes, MD Pharmacy:   [id number] [place], [place] [address] Phone: [phone] Fax: [phone]  [facility]rier [address] [place] - [address] [place] [address] Phone: [phone] Fax: [phone]  [facility] [place], [place] [address] Phone: [phone] Fax: [phone]     Social Determinants of Health ([place]) Interventions    Readmission Risk Interventions Readmission Risk Prevention Plan 10/18/2018  Transportation Screening Not Complete  Transportation Screening Comment Continued medical work-up; CIR pending  Medication Review Press photographer) Complete  PCP or Specialist appointment within 3-5 days of discharge Not Complete  PCP/Specialist Appt Not Complete comments Continued medical work-up; CIR pending  HRI or [place] Complete  SW Recovery Care/Counseling Consult Complete  Palliative Care Screening Not [place] Not Applicable  Some recent data might be hidden

## 2018-11-02 NOTE — Progress Notes (Signed)
Palliative:  HPI: 68 yo female with past medical history of CKD 4, diastolic CHF, morbid obesity with hypoventilation syndrome, diabetes, atrial fibrillation, IBS who was admitted on 09/22/2018 with an acute on chronic heart failure exacerbation and with worsening renal function.  She underwent RHC and was found to have markedly elevated filling pressures complicated by cardio-renal syndrome.  She was also noted to have severe pulmonary hypertension. On 5/30 she became very bradycardic and experienced a PEA arrest.  She received CPR for 8 minutes with ROSC.  She was intubated and placed in the ICU and started on CVVHD.  She was extubated on 6/3, but began to refuse bipap and was re-intubated again on 6/11. She developed MSSA PNA.  Tracheostomy was placed on 6/19.  She remained on CVVHD with pressor support for an extended duration and lost 40+kg.  She was transitioned to intermittent HD on 6/27, but remains on midodrine and florinef for hypotension.  She continues to require dopamine infusion for bradycardia.    I spoke with Mada again today and she continues to be in good spirits. She smiles and compliments my hair and tells me that she needs a haircut. Graceann is currently undergoing dialysis in her room. She has no complaints. I informed Gwendalyn that I was arranging for Luiz Ochoa and [name] to come and visit and speak with [place] and she is pleased.   I spoke further with [name] (with Keymoni's permission). [name] confirms that they plan to arrive tomorrow ~1000 am to speak further. I prepared her that I am glad that they will be able to visit but they need to be prepared to discuss more difficult decisions. I explained that she has continued to require dopamine and that there are no further options to improve her situation (confirmed with Dr. Haroldine Laws no plans for any type of pacing or intervention). I further explained that dopamine is only a temporary solution so we need to discuss plans if she continues to  be unable to wean off dopamine. At this stage [name] tells me that they are hoping for the best and hoping for some good news that she can come off of dopamine. We agreed we will discuss more tomorrow.   Exam: Alert, appears oriented. Trach to vent. Tolerating bedside dialysis. Continues on dopamine. No distress.   Plan: - Meeting tomorrow 11/03/18 1000 with family.  - Continue trials to wean dopamine.  - Need plan if she continues to be dopamine dependent (she cannot make these decisions and plans herself which is why family is required to participate in the conversation at bedside).   [place], NP Palliative Medicine Team Pager [phone] (Please see [email or web address] for schedule) Team Phone [phone]    Greater than 50%  of this time was spent counseling and coordinating care related to the above assessment and plan   The above conversation was completed via telephone due to the visitor restrictions during the COVID-19 pandemic. Thorough chart review and discussion with necessary members of the care team was completed as part of assessment.

## 2018-11-03 DIAGNOSIS — J9600 Acute respiratory failure, unspecified whether with hypoxia or hypercapnia: Secondary | ICD-10-CM

## 2018-11-03 LAB — RENAL FUNCTION PANEL
Albumin: 2.1 g/dL — ABNORMAL LOW (ref 3.5–5.0)
Anion gap: 12 (ref 5–15)
BUN: 45 mg/dL — ABNORMAL HIGH (ref 8–23)
CO2: 24 mmol/L (ref 22–32)
Calcium: 8.1 mg/dL — ABNORMAL LOW (ref 8.9–10.3)
Chloride: 98 mmol/L (ref 98–111)
Creatinine, Ser: 3.03 mg/dL — ABNORMAL HIGH (ref 0.44–1.00)
GFR calc Af Amer: 18 mL/min — ABNORMAL LOW (ref >60)
GFR calc non Af Amer: 15 mL/min — ABNORMAL LOW (ref >60)
Glucose, Bld: 154 mg/dL — ABNORMAL HIGH (ref 70–99)
Phosphorus: 3.1 mg/dL (ref 2.5–4.6)
Potassium: 3.9 mmol/L (ref 3.5–5.1)
Sodium: 134 mmol/L — ABNORMAL LOW (ref 135–145)

## 2018-11-03 LAB — GLUCOSE, CAPILLARY
Glucose-Capillary: 103 mg/dL — ABNORMAL HIGH (ref 70–99)
Glucose-Capillary: 145 mg/dL — ABNORMAL HIGH (ref 70–99)
Glucose-Capillary: 157 mg/dL — ABNORMAL HIGH (ref 70–99)
Glucose-Capillary: 159 mg/dL — ABNORMAL HIGH (ref 70–99)
Glucose-Capillary: 178 mg/dL — ABNORMAL HIGH (ref 70–99)
Glucose-Capillary: 181 mg/dL — ABNORMAL HIGH (ref 70–99)
Glucose-Capillary: 220 mg/dL — ABNORMAL HIGH (ref 70–99)
Glucose-Capillary: 89 mg/dL (ref 70–99)

## 2018-11-03 MED ORDER — PANCRELIPASE (LIP-PROT-AMYL) 10440-39150 UNITS PO TABS
20880.0000 [IU] | ORAL_TABLET | Freq: Once | ORAL | Status: AC
  Administered 2018-11-03: 20880 [IU]
  Filled 2018-11-03: qty 2

## 2018-11-03 MED ORDER — SODIUM BICARBONATE 650 MG PO TABS
650.0000 mg | ORAL_TABLET | Freq: Once | ORAL | Status: AC
  Administered 2018-11-03: 650 mg
  Filled 2018-11-03: qty 1

## 2018-11-03 MED ORDER — DARBEPOETIN ALFA 200 MCG/0.4ML IJ SOSY
200.0000 ug | PREFILLED_SYRINGE | INTRAMUSCULAR | Status: DC
  Administered 2018-11-09 – 2018-11-16 (×2): 200 ug via SUBCUTANEOUS
  Filled 2018-11-03 (×2): qty 0.4

## 2018-11-03 NOTE — Progress Notes (Signed)
Occupational Therapy Treatment Patient Details Name: Kathleen [name] MRN: [id number] DOB: [birth year] Today's Date: 11/03/2018    History of present illness Kathleen [name] is a 68 y.o. female with history of permanent AF, hypertension, super morbid obesity, DM2, dyslipidemia, obesity hypoventilation syndrome (on home O2 with no OSA by prior PSG but showed nocturnal hypoxemia), CKD stage IV (baseline creatinine about 1.9), chronic diastolic CHF.  Admit for fluid overload. Also with pulmonary HTN.  PEA arrest 5/30, transfer to the ICU, extubater 6/3.  Intubated 10/06/18.  Trach on 10/14/18.     OT comments  Pt smiling and in good spirits after seeing her family earlier today. Pt stood with stedy and +2 assist, but was able to stand without use of knee support, leaning on hip pads, but also performing some small weight shifts in standing. May be able to try use of sara plus next visit.  Follow Up Recommendations  LTACH;Supervision/Assistance - 24 hour    Equipment Recommendations  3 in 1 bedside commode    Recommendations for Other Services      Precautions / Restrictions Precautions Precautions: Fall Precaution Comments: pt on CPAP via trach Restrictions Weight Bearing Restrictions: No       Mobility Bed Mobility Overal bed mobility: Needs Assistance Bed Mobility: Supine to Sit;Sit to Supine     Supine to sit: +2 for physical assistance;Mod assist Sit to supine: +2 for physical assistance;Mod assist   General bed mobility comments: multimodal cues to initiate coming to EOB, min assist for LEs over EOB, mod assist to raise trunk and position hips at EOB, assist to guide trunk and for LEs back into bed to return to supine  Transfers Overall transfer level: Needs assistance   Transfers: Sit to/from Stand Sit to Stand: +2 physical assistance;Mod assist         General transfer comment: with use of stedy, assisted from slightly elevated bed, firm mattress to raise hips using bed  pad, performed x 2    Balance Overall balance assessment: Needs assistance   Sitting balance-Leahy Scale: Fair Sitting balance - Comments: min guard assist Postural control: Posterior lean Standing balance support: Bilateral upper extremity supported Standing balance-Leahy Scale: Poor Standing balance comment: worked on sit<>stand from firm bed to stedy, pt able to stand with B UE support leaning only hip pads x 20 seconds with some minimal weight shifting                           ADL either performed or assessed with clinical judgement   ADL                                               Vision       Perception     Praxis      Cognition Arousal/Alertness: Awake/alert Behavior During Therapy: WFL for tasks assessed/performed Overall Cognitive Status: Difficult to assess                                 General Comments: pt following commands with increased time, some slow processing - continue assessment         Exercises     Shoulder Instructions       General Comments      Pertinent  Vitals/ Pain       Pain Assessment: No/denies pain  Home Living                                          Prior Functioning/Environment              Frequency  Min 2X/week        Progress Toward Goals  OT Goals(current goals can now be found in the care plan section)  Progress towards OT goals: Progressing toward goals  Acute Rehab OT Goals Patient Stated Goal: to go home OT Goal Formulation: With patient Time For Goal Achievement: 11/15/18 Potential to Achieve Goals: [place] Discharge plan remains appropriate    Co-evaluation    PT/OT/SLP Co-Evaluation/Treatment: Yes Reason for Co-Treatment: Complexity of the patient's impairments (multi-system involvement);For patient/therapist safety   OT goals addressed during session: Strengthening/ROM      AM-PAC OT "6 Clicks" Daily Activity      Outcome Measure   Help from another person eating meals?: Total Help from another person taking care of personal grooming?: A Little Help from another person toileting, which includes using toliet, bedpan, or urinal?: Total Help from another person bathing (including washing, rinsing, drying)?: A Lot Help from another person to put on and taking off regular upper body clothing?: A Lot Help from another person to put on and taking off regular lower body clothing?: Total 6 Click Score: 10    End of Session    OT Visit Diagnosis: Muscle weakness (generalized) (M62.81);Unsteadiness on feet (R26.81);Other symptoms and signs involving cognitive function   Activity Tolerance Patient tolerated treatment well   Patient Left in bed;with call bell/phone within reach(pt in chair position)   Nurse Communication Mobility status        Time: 7078-6754 OT Time Calculation (min): 35 min  Charges: OT General Charges $OT Visit: 1 Visit OT Treatments $Therapeutic Activity: 8-22 mins  [name], OTR/L Acute Rehabilitation Services Pager: [phone] Office: [phone]   [name] 11/03/2018, 3:32 PM

## 2018-11-03 NOTE — Progress Notes (Signed)
Palliative:  HPI: 68 yo female with past medical history of CKD 4, diastolic CHF, morbid obesity with hypoventilation syndrome, diabetes, atrial fibrillation, IBSwho was admitted on 5/28/2020with an acute on chronic heart failure exacerbation and with worsening renal function. She underwent RHC and was found to have markedly elevated filling pressures complicated by cardio-renal syndrome. She was also noted to have severe pulmonary hypertension.On 5/30 she became very bradycardic and experienced a PEA arrest. She received CPR for 8 minutes with ROSC. She was intubated and placed in the ICU and started on CVVHD. She was extubated on 6/3, but began to refuse bipap and was re-intubated again on 6/11. She developed MSSA PNA. Tracheostomy was placed on 6/19. She remained on CVVHD with pressor support for an extended duration and lost 40+kg. She was transitioned to intermittent HD on 6/27, but remains on midodrine and florinef for hypotension. She continues to require dopamine infusion for bradycardia - has weaned since 7/8.    I met today with Kathleen [name] along with her son and daughter-in-law, Kathleen [name] and Kathleen [name], and Kathleen Rad, NP PCCM. We reviewed with them events of hospitalization and even though she has made tremendous progress she has an extremely difficult path ahead even with continued improvement and many barriers to improvement. She has been able to maintain stability off dopamine since yesterday evening. The next task is to make sure that she can tolerate dialysis without vasopressor. We discussed that her QOL may be very poor with trach (potentially with vent) and probable feeding tube as well as long term placement. High risk for reinfection, skin breakdown, not tolerating dialysis (especially with underlying cardiac disease).   We spoke with them about decisions at hand as far as code status, PEG placement, and continuing dialysis. Kathleen [name] has a difficult time expressing her thoughts on  this subject. Her family, although very hopeful and optimistic, tell her that they will support any decisions that she makes for herself. In the end there are no decisions made but will follow up with Kathleen [name] to verify her wishes as she indicates that she knows what she wants but does not express at this time. We left her to visit with her family at this time.   All questions/concerns addressed. Emotional support provided.   Exam: Alert, appears oriented. Nods head appropriately. Will not write. Follows commands. No distress. Trach to vent. Breathing regular, unlabored, tolerating vent. Off dopamine - HR/BP stable currently. Abd obese but soft. Generalized edema.   Plan:  - Follow up tomorrow for GOC to verify Kathleen [name]'s wishes.   [place], NP Palliative Medicine Team Pager [phone] (Please see [email or web address] for schedule) Team Phone [phone]    Greater than 50%  of this time was spent counseling and coordinating care related to the above assessment and plan

## 2018-11-03 NOTE — Progress Notes (Signed)
PCCM Interval Note   Met with patient's son and his wife at bedside with patient with Elmo Putt w/ PMT for goals of care meeting.   To delight, patient has been able to tolerate being off dopamine since yesterday.  I am hesitant to say for now if this will sustain and we will have to see how her blood pressure tolerates iHD.   We discussed her acute and chronic conditions and all questions answered.  She has a very long recovery ahead with the potential for many set backs.   Patient is not ready to make a decision specifically regarding CPR or other GOCs.  I suspect she is hesitant to make in the presence of her son, as she was tearful and kept looking to him during conversations and indicated all her progress was for him.    Ongoing support given for patient and family with encouragement for them to openly share their feelings and for Aiza to express her wishes so that we can honor them in the event of future complications.    Greatly appreciate palliative care assistance.  Patient to remain full code for now.    Additional CCT 30 mins  Kennieth Rad, MSN, AGACNP-BC [place] [facility] Pgr: [phone] or if no answer [phone] 11/03/2018, 11:33 AM

## 2018-11-03 NOTE — Progress Notes (Signed)
Pt's SATs dropped and sustained in low 80s after weaning on TC 10L 40% for 90 min. Increased FIO2 to 60% without any change. Pt appears drowsy at this time so RT placed pt back on vent with CPAP 5 PS 5.

## 2018-11-03 NOTE — Progress Notes (Signed)
NAME:  COTI BURD, MRN:  [id number], DOB:  August 04, 1950, LOS: 38 ADMISSION DATE:  09/22/2018, CONSULTATION DATE: 09/24/2018 REFERRING MD: Candee Furbish MD, CHIEF COMPLAINT: Cardiac arrest  Brief History   68 y/o obese woman with chronic diastolic heart failure who failed outpatient diuretics management due to chronic kidney disease.  Right heart cath on 5/29 showed markedly elevated biventricular filling pressures with normal cardiac output.  She was on cardiology service, maintained on milrinone and Lasix.  On the morning of 5/30 she went into PEA arrest, CPR performed for 8 minutes.  Intubated and transferred to ICU.  Started on CRRT 5/31.  Extubated 6/3.  Re-intubated early am 6/11 for hypercarbic respiratory failure and underwent tracheostomy on 6/19  Past Medical History   has a past medical history of Atrial flutter ([place]), Bradycardia, Chronic diastolic (congestive) heart failure ([place]) (01/10/2015), CKD (chronic kidney disease), stage IV ([place]), Degenerative joint disease of hand, Diabetes mellitus, Dyslipidemia, Fecal occult blood test positive, GERD (gastroesophageal reflux disease), Headache, Hypertension, Inadequate material resources, Irritable bowel syndrome, Morbid obesity ([place]), Obesity hypoventilation syndrome ([place]), Post-menopausal bleeding, and Shortness of breath dyspnea.  Significant Hospital Events   5/28 Admit 5/29 RHC 5/30 PEA arrest, intubated/ CRRT 5/31 Milrinone stopped due to ectopy; brady episode- amio stopped 6/02 Attempt at SBT, chest x-ray with worsening pulmonary edema, CVVHD for volume removal 6/03 Extubated > remain on CRRT.  Refused nocturnal BiPAP 6/04 Refused nocturnal BiPAP 6/10 off CRRT 6/11 Intubated early am with hypercarbia, concern for RUL PNA; abx broadened; restarted on CRRT 6/16  No events overnight, weaning on high PS 6/17  failed vent wean, remains on levophed, midodrine and CRRT 6/18  Weaned for 5 hours on PSV 8/5 before requiring full support;  ongoing CRRT, remains on levophed 14 mcg 6/19 - TRACH + Dr Erskine Emery 6/20 -. On IV heparin gt, levophed gtt with midodrine. Off fent gtt. Pressopr needs down 6/26 - TCT tolerated for the past 24 hours  6/27 - Stable on TCT now >48hrs, tolerated first session of iHD  6/30 hypotension / bradycardia overnight, started on dopamine 7/2 Placed on ATC at 40% fiO2 7/6 Palliative care consult/ iHD 7/7 remains dependent on dopamine at 7, for bradycardia/ hypotension.   Palliative consult noted - family would like to visit 7/9 for goals of care meeting.  Consults:  Cardiology PCCM  Nephrology  EP  Procedures:  Lt PICC 5/29 >> 6/30 OETT 5/30 >> 6/3; 6/11 > 6/19 - TRACH (Dr Erskine Emery) >> R IJ HD cath 5/30 >> Aline left ulnar 5/30 >> 6/2  Significant Diagnostic Tests:  TTE 5/28 >>The left ventricle has normal systolic function with an ejection fraction of 60-65%. . The right ventricle has normal systolic function. The cavity was mildly enlarged.   Right heart cath 5/29 RA = 24 RV = 94/26 PA = 95/36 (53) PCW = 30 (v = 50) Fick cardiac output/index = 6.0/2.7 PVR =3.9 WU FA sat = 91% PA sat = 58%, 58% SVC 60%  Micro Data:  SARS coronavirus 2 cepheid 5/28 >> neg MRSA PCR 5/28 >> neg Trach aspirate 6/2 >> MSSA BCx2 6/2 >> negative ...................... Tracheal aspirate 6/11 >> few GNR >> few proteus and few candida tropicalis BCx2 6/11 >> negative 10/27/2018 tracheal aspirate>> gram-positive cocci gram-negative rods>>  Antimicrobials:  Vancomycin 6/3 > 6/4 Cefazolin 6/4 >> 6/10 ...........................Marland Kitchen Vanco 6/11 >> 6/17 Cefepime 6/11 >>6/18 Cefepime 10/27/2018>> 7/6  Interim history/subjective:  3.5 L off yesterday w/ iHD Weaned well all  day yesterday Off dopamine maintaining SB in the 50's and normotensive No complaints per patient  Family meeting scheduled at 10 am today   Objective   Blood pressure (!) 131/42, pulse (!) 51, temperature 98.1 F (36.7 C), temperature  source Oral, resp. rate 18, height 5' (1.524 m), weight 108.2 kg, SpO2 100 %.    Vent Mode: PSV;CPAP FiO2 (%):  [40 %] 40 % Set Rate:  [20 bmp] 20 bmp Vt Set:  [360 mL] 360 mL PEEP:  [5 cmH20] 5 cmH20 Pressure Support:  [5 cmH20] 5 cmH20 Plateau Pressure:  [7 cmH20] 7 cmH20   Intake/Output Summary (Last 24 hours) at 11/03/2018 0940 Last data filed at 11/03/2018 0800 Gross per 24 hour  Intake 1083.86 ml  Output 3502 ml  Net -2418.14 ml   Filed Weights   10/31/18 1050 11/01/18 0400 11/02/18 1122  Weight: 107.3 kg 107.3 kg 108.2 kg   General:  Chronically ill female in NAD HEENT: MM pink/moist, R nare cortrack, midline cuffed trach Neuro: Awake- pleasant, f/c, MAE CV:  SB, rr, TDC right chest PULM: even/non-labored, lungs bilaterally clear GI: obese, NT/ ND, +bs, no foley  Extremities: warm/dry, LE wrapped in ACE bandages   Skin: no rashes   Assessment & Plan:    HD dependent renal failure. - Continue HD per nephrology.  Persistent resp failure now trach'd. - Daily ATC trials as tolerated  - Continue vent support otherwise - trach care   Proteus colonization vs. Bronchitis - s/p 5 day course cefepime completed 7/6. -  Monitor off abx. PCT remains reassuring/ negative  Persistent shock after PEA arrest. Bradycardia - off dopamine 7/8 - Continue florinef and midodrine as ordered. - not a candidate for any EP procedures   Nutrition. - Continue TF's. - Needs a PEG at some point.  Goals of care. - Family meeting scheduled with PMT today at 10am for future ongoing GOCs.  Patient could easily decompensate given ongoing multiple acute/ chronic conditions  Best practice:  Diet: Continue tube feeds Pain/Anxiety/Delirium protocol (if indicated): NA VAP protocol (if indicated): In place  DVT prophylaxis: None GI prophylaxis: Pepcid Glucose control: SSI Mobility: Bedrest Code Status: Full Disposition: ICU    CCT 30 mins  Kennieth Rad, MSN, AGACNP-BC [place]  [facility] Pgr: [phone] or if no answer [phone] 11/03/2018, 9:40 AM

## 2018-11-03 NOTE — Progress Notes (Signed)
Cortrak tube able to be accessed without having to place NG tube.

## 2018-11-03 NOTE — Progress Notes (Signed)
Physical Therapy Treatment Patient Details Name: Kathleen [name] MRN: [id number] DOB: 1950-07-07 Today's Date: 11/03/2018    History of Present Illness Kathleen [name] is a 68 y.o. female with history of permanent AF, hypertension, super morbid obesity, DM2, dyslipidemia, obesity hypoventilation syndrome (on home O2 with no OSA by prior PSG but showed nocturnal hypoxemia), CKD stage IV (baseline creatinine about 1.9), chronic diastolic CHF.  Admit for fluid overload. Also with pulmonary HTN.  PEA arrest 5/30, transfer to the ICU, extubater 6/3.  Intubated 10/06/18.  Trach on 10/14/18.      PT Comments    Pt is in good mood after seeing family today and is motivated to work with therapy. Pt is modAx2 for bed mobility and modAx2 for power up to standing in [place]. Pt able to stand with min A from elevated hip pads of Stedy and statically stand without outside assist for 20 seconds. Pt is making progress towards her goals and may be able to use Clarise Cruz Plus to progress mobility in next session. PT will continue to follow acutely.   Follow Up Recommendations  LTACH;Supervision/Assistance - 24 hour     Equipment Recommendations  None recommended by PT;Other (comment)(TBA)       Precautions / Restrictions Precautions Precautions: Fall Precaution Comments: pt on CPAP via trach Restrictions Weight Bearing Restrictions: No    Mobility  Bed Mobility Overal bed mobility: Needs Assistance Bed Mobility: Supine to Sit;Sit to Supine     Supine to sit: +2 for physical assistance;Mod assist Sit to supine: +2 for physical assistance;Mod assist   General bed mobility comments: multimodal cues to initiate coming to EOB, min assist for LEs over EOB, mod assist to raise trunk and position hips at EOB, assist to guide trunk and for LEs back into bed to return to supine  Transfers Overall transfer level: Needs assistance   Transfers: Sit to/from Stand Sit to Stand: +2 physical assistance;Mod assist          General transfer comment: with use of stedy, assisted from slightly elevated bed, firm mattress to raise hips using bed pad, performed x 2      Balance Overall balance assessment: Needs assistance   Sitting balance-Leahy Scale: Fair Sitting balance - Comments: min guard assist Postural control: Posterior lean Standing balance support: Bilateral upper extremity supported Standing balance-Leahy Scale: Poor Standing balance comment: worked on sit<>stand from firm bed to stedy, pt able to stand with B UE support leaning only hip pads x 20 seconds with some minimal weight shifting                            Cognition Arousal/Alertness: Awake/alert Behavior During Therapy: WFL for tasks assessed/performed Overall Cognitive Status: Difficult to assess                                 General Comments: pt following commands with increased time, some slow processing - continue assessment              Pertinent Vitals/Pain Pain Assessment: No/denies pain           PT Goals (current goals can now be found in the care plan section) Acute Rehab PT Goals Patient Stated Goal: to go home PT Goal Formulation: With patient Time For Goal Achievement: 11/14/18 Potential to Achieve Goals: Good Progress towards PT goals: Progressing toward goals  Frequency    Min 3X/week      PT Plan Current plan remains appropriate    Co-evaluation PT/OT/SLP Co-Evaluation/Treatment: Yes Reason for Co-Treatment: Complexity of the patient's impairments (multi-system involvement) PT goals addressed during session: Mobility/safety with mobility;Balance;Strengthening/ROM OT goals addressed during session: Strengthening/ROM      AM-PAC PT "6 Clicks" Mobility   Outcome Measure  Help needed turning from your back to your side while in a flat bed without using bedrails?: A Lot Help needed moving from lying on your back to sitting on the side of a flat bed without using  bedrails?: A Lot Help needed moving to and from a bed to a chair (including a wheelchair)?: Total Help needed standing up from a chair using your arms (e.g., wheelchair or bedside chair)?: A Lot Help needed to walk in hospital room?: Total Help needed climbing 3-5 steps with a railing? : Total 6 Click Score: 9    End of Session Equipment Utilized During Treatment: Gait belt;Oxygen(vent/trach) Activity Tolerance: Other (comment);Patient limited by fatigue(limited by anxiety) Patient left: in chair;with call bell/phone within reach;with chair alarm set Nurse Communication: Mobility status;Need for lift equipment PT Visit Diagnosis: Other abnormalities of gait and mobility (R26.89);Muscle weakness (generalized) (M62.81)     Time: 9784-7841 PT Time Calculation (min) (ACUTE ONLY): 28 min  Charges:  $Therapeutic Activity: 8-22 mins                     Karnisha Lefebre B. Migdalia Dk PT, DPT Acute Rehabilitation Services Pager [phone] Office [phone]    [place] 11/03/2018, 5:57 PM

## 2018-11-03 NOTE — Progress Notes (Signed)
[facility] Progress Note    Assessment/ Plan:   1.  Acute kidney injury Superimposed on CKD IV (baseline 1.7-1.9) likely ATN secondary to shock, anuric: Transitioned to iHD on 6/27. Using right [facility] and on MWF schedule.  She is on midodrine for hypotension at 20 TID and florinef 0.2 mg daily added 7/4.  HD 7/6, 7/8, next planned HD 7/10.  No signs of recovery at present- am not hopeful that she will be liberated from dialysis. Continue supportive care at this present time. Will try for healthy UF goal again tomorrow.  2.  Proteus +respiratory culture: s/p cefepime, ended 7/8 per PCCM.    3.  Chronic respiratory failure: Status post tracheostomy.  Currently on vent, per pulmonary, weaning as tolerated.  4.  Acute on chronic diastolic heart failure: Appreciate the expertise of the heart failure team.  Plans to try to correlate bradycardia with hypotension to see if could/ would be a candidate for TVP/ RV micro device--> not a candidate for these interventions per notes; not felt to be a candidate for PPM per EP team.    5.  Anemia due to chronic disease and acute blood loss: Continue ESA, Increase Aranesp to 200 mcg for 7/15, on a course of IV iron.    6.  Bradycardia, PEA cardiac arrest: Dopamine dependent now.  As above in #4.  7.  Dispo: remains in ICU.  Palliative care discussion noted and greatly appreciated.  Family meeting today at 10 am  Subjective:    Family meeting for this AM noted.  HD yesterday with 3.5L off.  No complaints this AM.     Objective:   BP (!) 131/42   Pulse (!) 51   Temp 98.1 F (36.7 C) (Oral)   Resp 18   Ht 5' (1.524 m)   Wt 108.2 kg   SpO2 100%   BMI 46.59 kg/m   Intake/Output Summary (Last 24 hours) at 11/03/2018 0945 Last data filed at 11/03/2018 0800 Gross per 24 hour  Intake 1083.86 ml  Output 3502 ml  Net -2418.14 ml   Weight change:   Physical Exam: Gen: obese, lying in bed, NAD, conversant/ awake/ alert HEENT: trach in  place, on vent CVS: RRR right now Resp:dim breath sounds bilaterally Abd: obese Ext: legs wrapped in Unna boots today ACCESS: R IJ TDC  Imaging: No results found.  Labs: BMET Recent Labs  Lab 10/28/18 0228 10/29/18 0247 10/30/18 0353 10/31/18 0521 11/01/18 0408 11/02/18 0343 11/03/18 0503  NA 132* 132* 131* 128* 129* 128* 134*  K 4.4 3.8 3.7 3.9 3.7 3.8 3.9  CL 94* 97* 95* 92* 94* 94* 98  CO2 25 25 22  20* 22 21* 24  GLUCOSE 140* 250* 182* 250* 253* 266* 154*  BUN 55* 45* 70* 91* 55* 72* 45*  CREATININE 4.04* 3.03* 4.06* 4.98* 3.48* 4.42* 3.03*  CALCIUM 8.8* 8.7* 8.6* 8.6* 8.2* 8.3* 8.1*  PHOS 3.2 3.0 4.0 4.0 2.6 3.7 3.1   CBC Recent Labs  Lab 10/30/18 0353 10/31/18 0521 11/01/18 0408 11/02/18 0343  WBC 7.3 8.5 8.1 7.5  HGB 8.2* 8.2* 8.0* 8.0*  HCT 28.4* 27.9* 27.5* 27.0*  MCV 95.9 94.6 95.5 95.1  PLT 97* 101* 117* 138*    Medications:    . aspirin  81 mg Per Tube Daily  . atorvastatin  40 mg Per Tube q1800  . chlorhexidine gluconate (MEDLINE KIT)  15 mL Mouth Rinse BID  . Chlorhexidine Gluconate Cloth  6 each Topical Daily  .  Chlorhexidine Gluconate Cloth  6 each Topical Daily  . Chlorhexidine Gluconate Cloth  6 each Topical Q0600  . darbepoetin (ARANESP) injection - NON-DIALYSIS  100 mcg Subcutaneous Q Wed-1800  . feeding supplement (PRO-STAT SUGAR FREE 64)  30 mL Per Tube TID  . fludrocortisone  0.2 mg Oral Daily  . guaiFENesin  15 mL Per Tube Q6H  . insulin aspart  0-20 Units Subcutaneous Q4H  . insulin detemir  40 Units Subcutaneous BID  . mouth rinse  15 mL Mouth Rinse 10 times per day  . midodrine  20 mg Per Tube TID WC  . polyethylene glycol  17 g Oral Daily  . sodium chloride flush  10-40 mL Intracatheter Q12H      Madelon Lips, MD [facility] pgr [phone] 11/03/2018, 9:45 AM

## 2018-11-03 NOTE — Progress Notes (Signed)
Pt's cortrak clogged. Despite multiple interventions as well as following the "unclogging feeding tube" orders. Reached out to Dietary team. IR not available at this time. Cortrak team not here until tomorrow. Dr. Vaughan Browner notified. Orders to place NG tube for now.

## 2018-11-04 LAB — GLUCOSE, CAPILLARY
Glucose-Capillary: 128 mg/dL — ABNORMAL HIGH (ref 70–99)
Glucose-Capillary: 149 mg/dL — ABNORMAL HIGH (ref 70–99)
Glucose-Capillary: 152 mg/dL — ABNORMAL HIGH (ref 70–99)
Glucose-Capillary: 153 mg/dL — ABNORMAL HIGH (ref 70–99)
Glucose-Capillary: 159 mg/dL — ABNORMAL HIGH (ref 70–99)
Glucose-Capillary: 185 mg/dL — ABNORMAL HIGH (ref 70–99)
Glucose-Capillary: 96 mg/dL (ref 70–99)

## 2018-11-04 LAB — CBC
HCT: 26.8 % — ABNORMAL LOW (ref 36.0–46.0)
Hemoglobin: 7.9 g/dL — ABNORMAL LOW (ref 12.0–15.0)
MCH: 28.5 pg (ref 26.0–34.0)
MCHC: 29.5 g/dL — ABNORMAL LOW (ref 30.0–36.0)
MCV: 96.8 fL (ref 80.0–100.0)
Platelets: 190 10*3/uL (ref 150–400)
RBC: 2.77 MIL/uL — ABNORMAL LOW (ref 3.87–5.11)
RDW: 28.3 % — ABNORMAL HIGH (ref 11.5–15.5)
WBC: 6.4 10*3/uL (ref 4.0–10.5)
nRBC: 0 % (ref 0.0–0.2)

## 2018-11-04 LAB — RENAL FUNCTION PANEL
Albumin: 2.2 g/dL — ABNORMAL LOW (ref 3.5–5.0)
Anion gap: 14 (ref 5–15)
BUN: 71 mg/dL — ABNORMAL HIGH (ref 8–23)
CO2: 22 mmol/L (ref 22–32)
Calcium: 8.6 mg/dL — ABNORMAL LOW (ref 8.9–10.3)
Chloride: 97 mmol/L — ABNORMAL LOW (ref 98–111)
Creatinine, Ser: 4.2 mg/dL — ABNORMAL HIGH (ref 0.44–1.00)
GFR calc Af Amer: 12 mL/min — ABNORMAL LOW (ref >60)
GFR calc non Af Amer: 10 mL/min — ABNORMAL LOW (ref >60)
Glucose, Bld: 113 mg/dL — ABNORMAL HIGH (ref 70–99)
Phosphorus: 4.1 mg/dL (ref 2.5–4.6)
Potassium: 4.7 mmol/L (ref 3.5–5.1)
Sodium: 133 mmol/L — ABNORMAL LOW (ref 135–145)

## 2018-11-04 LAB — HEPARIN LEVEL (UNFRACTIONATED): Heparin Unfractionated: 0.42 IU/mL (ref 0.30–0.70)

## 2018-11-04 MED ORDER — HEPARIN SODIUM (PORCINE) 1000 UNIT/ML IJ SOLN
3600.0000 [IU] | Freq: Once | INTRAMUSCULAR | Status: AC
  Administered 2018-11-04: 3600 [IU]

## 2018-11-04 MED ORDER — ALBUMIN HUMAN 25 % IV SOLN
25.0000 g | Freq: Once | INTRAVENOUS | Status: AC
  Administered 2018-11-04: 10:00:00 25 g via INTRAVENOUS
  Filled 2018-11-04: qty 100

## 2018-11-04 MED ORDER — HEPARIN (PORCINE) 25000 UT/250ML-% IV SOLN
1600.0000 [IU]/h | INTRAVENOUS | Status: DC
  Administered 2018-11-04 – 2018-11-08 (×7): 1500 [IU]/h via INTRAVENOUS
  Administered 2018-11-10: 1600 [IU]/h via INTRAVENOUS
  Filled 2018-11-04 (×10): qty 250

## 2018-11-04 MED ORDER — GERHARDT'S BUTT CREAM
TOPICAL_CREAM | CUTANEOUS | Status: DC | PRN
  Administered 2018-11-04: 1 via TOPICAL
  Filled 2018-11-04: qty 1

## 2018-11-04 NOTE — Progress Notes (Addendum)
NAME:  Kathleen [name], MRN:  [id number], DOB:  Sep 20, 1950, LOS: 61 ADMISSION DATE:  09/22/2018, CONSULTATION DATE: 09/24/2018 REFERRING MD: Candee Furbish MD, CHIEF COMPLAINT: Cardiac arrest  Brief History   68 y/o obese woman with chronic diastolic heart failure who failed outpatient diuretics management due to chronic kidney disease.  Right heart cath on 5/29 showed markedly elevated biventricular filling pressures with normal cardiac output.  She was on cardiology service, maintained on milrinone and Lasix.  On the morning of 5/30 she went into PEA arrest, CPR performed for 8 minutes.  Intubated and transferred to ICU.  Started on CRRT 5/31.  Extubated 6/3.  Re-intubated early am 6/11 for hypercarbic respiratory failure and underwent tracheostomy on 6/19  Past Medical History   has a past medical history of Atrial flutter ([place]), Bradycardia, Chronic diastolic (congestive) heart failure ([place]) (01/10/2015), CKD (chronic kidney disease), stage IV ([place]), Degenerative joint disease of hand, Diabetes mellitus, Dyslipidemia, Fecal occult blood test positive, GERD (gastroesophageal reflux disease), Headache, Hypertension, Inadequate material resources, Irritable bowel syndrome, Morbid obesity ([place]), Obesity hypoventilation syndrome ([place]), Post-menopausal bleeding, and Shortness of breath dyspnea.  Significant Hospital Events   5/28 Admit 5/29 RHC 5/30 PEA arrest, intubated/ CRRT 5/31 Milrinone stopped due to ectopy; brady episode- amio stopped 6/02 Attempt at SBT, chest x-ray with worsening pulmonary edema, CVVHD for volume removal 6/03 Extubated > remain on CRRT.  Refused nocturnal BiPAP 6/04 Refused nocturnal BiPAP 6/10 off CRRT 6/11 Intubated early am with hypercarbia, concern for RUL PNA; abx broadened; restarted on CRRT 6/16  No events overnight, weaning on high PS 6/17  failed vent wean, remains on levophed, midodrine and CRRT 6/18  Weaned for 5 hours on PSV 8/5 before requiring full support;  ongoing CRRT, remains on levophed 14 mcg 6/19 - TRACH + Dr Erskine Emery 6/20 -. On IV heparin gt, levophed gtt with midodrine. Off fent gtt. Pressopr needs down 6/26 - TCT tolerated for the past 24 hours  6/27 - Stable on TCT now >48hrs, tolerated first session of iHD  6/30 hypotension / bradycardia overnight, started on dopamine 7/2 Placed on ATC at 40% fiO2 7/6 Palliative care consult/ iHD 7/7 remains dependent on dopamine at 7, for bradycardia/ hypotension.   Palliative consult noted - family would like to visit 7/9 for goals of care meeting. 7/08 3.5 L off w/ iHD, weaned well all day, dopamine off late evening 7/9 great day, family meeting w/son and PMT, remains full code per patient  Consults:  Cardiology PCCM  Nephrology  EP  Procedures:  Lt PICC 5/29 >> 6/30 OETT 5/30 >> 6/3; 6/11 > 6/19 - TRACH (Dr Erskine Emery) >> R IJ HD cath 5/30 >> Aline left ulnar 5/30 >> 6/2 R nare cortrak 6/5 >>  Significant Diagnostic Tests:  TTE 5/28 >>The left ventricle has normal systolic function with an ejection fraction of 60-65%. . The right ventricle has normal systolic function. The cavity was mildly enlarged.   Right heart cath 5/29 RA = 24 RV = 94/26 PA = 95/36 (53) PCW = 30 (v = 50) Fick cardiac output/index = 6.0/2.7 PVR =3.9 WU FA sat = 91% PA sat = 58%, 58% SVC 60%  Micro Data:  SARS coronavirus 2 cepheid 5/28 >> neg MRSA PCR 5/28 >> neg Trach aspirate 6/2 >> MSSA BCx2 6/2 >> negative ...................... Tracheal aspirate 6/11 >> few GNR >> few proteus and few candida tropicalis BCx2 6/11 >> negative 10/27/2018 tracheal aspirate>> gram-positive cocci gram-negative rods>>  Antimicrobials:  Vancomycin 6/3 > 6/4 Cefazolin 6/4 >> 6/10 ...........................Marland Kitchen Vanco 6/11 >> 6/17 Cefepime 6/11 >>6/18 Cefepime 10/27/2018>> 7/6  Interim history/subjective:  Tolerated couple hours on trach collar before desaturating without significant increase in WOB.  Overnight, ongoing  SB in 40-50's without hypotension iHD scheduled for today Patient mouths she doesn't feel as well today, tearful but does not want to talk about it Patient refused to go on ATC this am- doing well on PSV 5/5 Intermittently in SB/ afib  Objective   Blood pressure (!) 126/48, pulse (!) 59, temperature 98.5 F (36.9 C), temperature source Oral, resp. rate (!) 24, height 5' (1.524 m), weight 108.7 kg, SpO2 96 %.    Vent Mode: PSV;CPAP FiO2 (%):  [40 %] 40 % Set Rate:  [20 bmp] 20 bmp Vt Set:  [360 mL] 360 mL PEEP:  [5 cmH20] 5 cmH20 Pressure Support:  [5 cmH20] 5 cmH20   Intake/Output Summary (Last 24 hours) at 11/04/2018 9323 Last data filed at 11/04/2018 0800 Gross per 24 hour  Intake 1052.5 ml  Output -  Net 1052.5 ml   Filed Weights   11/01/18 0400 11/02/18 1122 11/04/18 0900  Weight: 107.3 kg 108.2 kg 108.7 kg   General:  Chronically ill female in NAD on PSV 5/5, tearful HEENT: MM pink/moist, R nare cortrack, midline cuffed trach Neuro: Awake, f/c, MAE CV:  SB, rr, TDC right chest PULM: even/non-labored, lungs- scattered rhonchi on R, currently doing great on PSV 5/5 GI: obese, NT/ ND, +bs, no foley  Extremities: warm/dry, LE wrapped in ACE bandages/ boots Skin: no rashes   Assessment & Plan:    HD dependent renal failure.  Remains anuric.  - Continue HD per nephrology.  Trial of iHD today, will see how her BP does off dopamine, continues midodrine  Persistent resp failure now trach'd. - Daily ATC trials as tolerated, encourage patient  - Continue vent support otherwise - trach care  - start LTAC placement  Proteus colonization vs. Bronchitis - s/p 5 day course cefepime completed 7/6. -  Monitor off abx.  Persistent shock after PEA arrest- resolved  Bradycardia - off dopamine 7/8,  Intermittent Afib/ hx of Aflutter Acute on chronic diastolic HF - Continue florinef and midodrine  - volume per iHD - not a candidate for any EP procedures  - will see how BP  tolerates iHD today, if remains stable will transfer primary service to [facility] and PCCM will follow for vent management.  Case management consulted for LTAC placement  - not currently on [facility], CHADs-vasc score 4, heparin off 6/29 due to trach bleeding- since resolved and Hgb trend stable.  Will restart heparin IV per pharmacy given possibility patient will probably need Gtube next week, then transition back to eliquis  Nutrition - Continue TF's. - Needs a PEG at some point if fails to progress on ATC.  Day 34/x of cortrak  Anemia - stable, restarting heparin today, monitor CBC/ bleeding  Goals of care. - Family meeting held with PMT and son 7/9.  Patient was not ready to make a decision, kept looking to son and remained tearful.  She remains full code for now per patient.  Ongoing. Appreciate PMT assistance   Best practice:  Diet: Continue tube feeds Pain/Anxiety/Delirium protocol (if indicated): NA VAP protocol (if indicated): In place  DVT prophylaxis: restarting heparin gtt 7/10 GI prophylaxis: Pepcid Glucose control: SSI Mobility: PT/ OT as tolerated Code Status: Full Disposition: ICU    CCT 35 mins  Kennieth Rad,  MSN, AGACNP-BC [facility] Pgr: [phone] or if no answer [phone] 11/04/2018, 9:52 AM

## 2018-11-04 NOTE — TOC Progression Note (Addendum)
Transition of Care [facility]) - Progression Note    Patient Details  Name: Kathleen [name] MRN: [id number] Date of Birth: 03-Feb-1951  Transition of Care [facility]) CM/SW [place] RN, BSN, NCM-BC, [place] [phone] Phone Number: 11/04/2018, 11:18 AM  Clinical Narrative:    [facility] consult acknowledged. CM met with the patient to discuss the POC and LTACH options; no HD beds at [facility], but [facility] bed is available at [facility] (pending auth). CM also discussed additional LTACH facilities across [place], even in [place], [place] where he son resides, with the patient declining any facilities in [place] and requested a facility in [place]. Patient gave CM verbal permission to update her son on her preference and DCP. LTACH referral given to Kathleen [name] [facility] liaison. CM updated Kathleen [name] and will continue to follow for progression needs.    ADDENDUM: 11/04/18 @ 1348-Kathleen [name] RNCM-CM spoke to the patient at the bedside with verbal permission given by the patient for the CM to discuss her POC needs with Kathleen [name] @ [phone]. CM assured the patient that all decisions will be discussed with the patient, since the patient is mentally stable and able to make her own decisions, with the patient giving a "thumbs up" and nodding/smiling in agreement.    Expected Discharge Plan: Long Term Acute Care (LTAC) Barriers to Discharge: Continued Medical Work up, Orthoptist and Services Expected Discharge Plan: Long Term Acute Care (LTAC) In-house Referral: Hospice / Palliative Care Discharge Planning Services: CM Consult Post Acute Care Choice: Long Term Acute Care (LTAC) Living arrangements for the past 2 months: Single Family Home                 DME Arranged: N/A DME Agency: NA       HH Arranged: NA HH Agency: NA         Social Determinants of Health (SDOH) Interventions    Readmission Risk Interventions Readmission Risk  Prevention Plan 11/04/2018 10/18/2018  Transportation Screening - Not Complete  Transportation Screening Comment LTACH pending insurance authorization Continued medical work-up; CIR pending  Medication Review Press photographer) - Complete  PCP or Specialist appointment within 3-5 days of discharge - Not Complete  PCP/Specialist Appt Not Complete comments LTACH pending insurance authorization Continued medical work-up; CIR pending  HRI or [place] - Complete  SW Recovery Care/Counseling Consult - Complete  Palliative Care Screening Complete Not [place] - Not Applicable  Some recent data might be hidden

## 2018-11-04 NOTE — Progress Notes (Signed)
[facility] Progress Note    Assessment/ Plan:   1.  Acute kidney injury Superimposed on CKD IV (baseline 1.7-1.9) likely ATN secondary to shock, anuric: Transitioned to iHD on 6/27. Using right [facility] and on MWF schedule.  She is on midodrine for hypotension at 20 TID and florinef 0.2 mg daily added 7/4.  HD 7/6, 7/8, next planned HD 7/10.  No signs of recovery at present- am not hopeful that she will be liberated from dialysis. Continue supportive care at this present time. Will try for healthy UF goal again tomorrow.  2.  Proteus +respiratory culture: s/p cefepime, ended 7/8 per PCCM.    3.  Chronic respiratory failure: Status post tracheostomy.  Currently on vent, per pulmonary, weaning as tolerated.  4.  Acute on chronic diastolic heart failure: Appreciate the expertise of the heart failure team.  Plans to try to correlate bradycardia with hypotension to see if could/ would be a candidate for TVP/ RV micro device--> not a candidate for these interventions per notes; not felt to be a candidate for PPM per EP team.  She is off dopamine at present--> will see if she can tolerate dialysis off dopamine.     5.  Anemia due to chronic disease and acute blood loss: Continue ESA, Increase Aranesp to 200 mcg for 7/15, on a course of IV iron.    6.  Bradycardia, PEA cardiac arrest:  As above in #4.  7.  Dispo: remains in ICU.  Palliative care discussion noted and greatly appreciated.  Continuing GOC discussions  Subjective:    Sleeping this AM.  Apparently pulled her vent off her trach yesterday.     Objective:   BP (!) 112/46   Pulse (!) 59   Temp 98.5 F (36.9 C) (Oral)   Resp (!) 24   Ht 5' (1.524 m)   Wt 108.2 kg   SpO2 96%   BMI 46.59 kg/m   Intake/Output Summary (Last 24 hours) at 11/04/2018 0920 Last data filed at 11/04/2018 0800 Gross per 24 hour  Intake 1052.5 ml  Output -  Net 1052.5 ml   Weight change:   Physical Exam: Gen: obese, lying in bed, NAD,  sleeping, comfortable HEENT: trach in place, on vent CVS: RRR right now Resp:dim breath sounds bilaterally Abd: obese Ext: legs wrapped in Unna boots today ACCESS: R IJ TDC  Imaging: No results found.  Labs: BMET Recent Labs  Lab 10/29/18 0247 10/30/18 0353 10/31/18 0521 11/01/18 0408 11/02/18 0343 11/03/18 0503 11/04/18 0500  NA 132* 131* 128* 129* 128* 134* 133*  K 3.8 3.7 3.9 3.7 3.8 3.9 4.7  CL 97* 95* 92* 94* 94* 98 97*  CO2 25 22 20* 22 21* 24 22  GLUCOSE 250* 182* 250* 253* 266* 154* 113*  BUN 45* 70* 91* 55* 72* 45* 71*  CREATININE 3.03* 4.06* 4.98* 3.48* 4.42* 3.03* 4.20*  CALCIUM 8.7* 8.6* 8.6* 8.2* 8.3* 8.1* 8.6*  PHOS 3.0 4.0 4.0 2.6 3.7 3.1 4.1   CBC Recent Labs  Lab 10/31/18 0521 11/01/18 0408 11/02/18 0343 11/04/18 0500  WBC 8.5 8.1 7.5 6.4  HGB 8.2* 8.0* 8.0* 7.9*  HCT 27.9* 27.5* 27.0* 26.8*  MCV 94.6 95.5 95.1 96.8  PLT 101* 117* 138* 190    Medications:    . aspirin  81 mg Per Tube Daily  . atorvastatin  40 mg Per Tube q1800  . chlorhexidine gluconate (MEDLINE KIT)  15 mL Mouth Rinse BID  . Chlorhexidine Gluconate Cloth  6 each Topical Daily  . Chlorhexidine Gluconate Cloth  6 each Topical Daily  . Chlorhexidine Gluconate Cloth  6 each Topical Q0600  . [START ON 11/09/2018] darbepoetin (ARANESP) injection - NON-DIALYSIS  200 mcg Subcutaneous Q Wed-1800  . feeding supplement (PRO-STAT SUGAR FREE 64)  30 mL Per Tube TID  . fludrocortisone  0.2 mg Oral Daily  . guaiFENesin  15 mL Per Tube Q6H  . insulin aspart  0-20 Units Subcutaneous Q4H  . insulin detemir  40 Units Subcutaneous BID  . mouth rinse  15 mL Mouth Rinse 10 times per day  . midodrine  20 mg Per Tube TID WC  . polyethylene glycol  17 g Oral Daily  . sodium chloride flush  10-40 mL Intracatheter Q12H      Madelon Lips, MD [facility] pgr [phone] 11/04/2018, 9:19 AM

## 2018-11-04 NOTE — Progress Notes (Signed)
RN observed patient with ventilator tubing in her hand disconnected from tracheostomy. RN explained to patient the importance of keeping the ventilator tubing on the trach. Patient nodded yes to understanding.

## 2018-11-04 NOTE — Progress Notes (Signed)
SLP Cancellation Note  Patient Details Name: Kathleen [name] MRN: [id number] DOB: Nov 20, 1950   Cancelled treatment:       Reason Eval/Treat Not Completed: Patient at procedure or test/unavailable; checked in for in line PMV tx this date; pt however receiving dialysis at bedside. Will follow up for PMV treatment as scheduling permits    [place] MA, [place]  11/04/2018, 10:00 AM

## 2018-11-04 NOTE — Progress Notes (Signed)
ANTICOAGULATION CONSULT NOTE - Initial Consult  Pharmacy Consult for heparin Indication: atrial fibrillation  Allergies  Allergen Reactions  . Doxycycline     REACTION: Wheals and pruritus    Patient Measurements: Height: 5' (152.4 cm) Weight: 239 lb 10.2 oz (108.7 kg) IBW/kg (Calculated) : 45.5 Heparin Dosing Weight: 72kg  Vital Signs: Temp: 98.5 F (36.9 C) (07/10 0801) Temp Source: Oral (07/10 0801) BP: 108/37 (07/10 1000) Pulse Rate: 61 (07/10 1000)  Labs: Recent Labs    11/02/18 0343 11/03/18 0503 11/04/18 0500  HGB 8.0*  --  7.9*  HCT 27.0*  --  26.8*  PLT 138*  --  190  CREATININE 4.42* 3.03* 4.20*    Estimated Creatinine Clearance: 14.5 mL/min (A) (by C-G formula based on SCr of 4.2 mg/dL (H)).   Medical History: Past Medical History:  Diagnosis Date  . Atrial flutter ([place])    a. permanent.  . Bradycardia   . Chronic diastolic (congestive) heart failure ([place]) 01/10/2015  . CKD (chronic kidney disease), stage IV ([place])   . Degenerative joint disease of hand   . Diabetes mellitus   . Dyslipidemia   . Fecal occult blood test positive   . GERD (gastroesophageal reflux disease)   . Headache   . Hypertension   . Inadequate material resources   . Irritable bowel syndrome   . Morbid obesity ([place])   . Obesity hypoventilation syndrome ([place])   . Post-menopausal bleeding   . Shortness of breath dyspnea    multifactorial from obesity, deconditioning, obesity hypoventilation syndrome     Assessment: 3 yoF with complicated admission on Eliquis at home for hx of AFib, transitioned to heparin on admission which has been held since 6/29 due to bleeding and bruising at trach site. Palliative care was consulted and it was unclear whether pt would be able to wean off dopamine support, so heparin continued to be held. Pt now tolerating iHD without pressors, so per CCM will resume IV heparin as bleeding issues appear to have resolved.  Goal of Therapy:  Heparin  level 0.3-0.7 units/ml Monitor platelets by anticoagulation protocol: Yes   Plan:  Heparin 1500 units/hr with no bolus Check 8hr heparin level this evening   Arrie Senate, PharmD, BCPS Clinical Pharmacist [phone] Please check AMION for all [place] numbers 11/04/2018

## 2018-11-04 NOTE — Progress Notes (Signed)
ANTICOAGULATION CONSULT NOTE - Follow Up Consult  Pharmacy Consult for heparin Indication: atrial fibrillation  Allergies  Allergen Reactions  . Doxycycline     REACTION: Wheals and pruritus    Patient Measurements: Height: 5' (152.4 cm) Weight: 233 lb 0.4 oz (105.7 kg) IBW/kg (Calculated) : 45.5 Heparin Dosing Weight: 72 kg  Vital Signs: Temp: 99.1 F (37.3 C) (07/10 1900) Temp Source: Oral (07/10 1900) BP: 95/35 (07/10 1900) Pulse Rate: 50 (07/10 1900)  Labs: Recent Labs    11/02/18 0343 11/03/18 0503 11/04/18 0500 11/04/18 1853  HGB 8.0*  --  7.9*  --   HCT 27.0*  --  26.8*  --   PLT 138*  --  190  --   HEPARINUNFRC  --   --   --  0.42  CREATININE 4.42* 3.03* 4.20*  --     Estimated Creatinine Clearance: 14.3 mL/min (A) (by C-G formula based on SCr of 4.2 mg/dL (H)).   Medications:  Scheduled:  . aspirin  81 mg Per Tube Daily  . atorvastatin  40 mg Per Tube q1800  . chlorhexidine gluconate (MEDLINE KIT)  15 mL Mouth Rinse BID  . Chlorhexidine Gluconate Cloth  6 each Topical Daily  . Chlorhexidine Gluconate Cloth  6 each Topical Daily  . Chlorhexidine Gluconate Cloth  6 each Topical Q0600  . [START ON 11/09/2018] darbepoetin (ARANESP) injection - NON-DIALYSIS  200 mcg Subcutaneous Q Wed-1800  . feeding supplement (PRO-STAT SUGAR FREE 64)  30 mL Per Tube TID  . fludrocortisone  0.2 mg Oral Daily  . guaiFENesin  15 mL Per Tube Q6H  . insulin aspart  0-20 Units Subcutaneous Q4H  . insulin detemir  40 Units Subcutaneous BID  . mouth rinse  15 mL Mouth Rinse 10 times per day  . midodrine  20 mg Per Tube TID WC  . polyethylene glycol  17 g Oral Daily  . sodium chloride flush  10-40 mL Intracatheter Q12H   Infusions:  . sodium chloride 250 mL (10/31/18 2358)  . DOPamine 5 mcg/kg/min (11/02/18 0600)  . famotidine (PEPCID) IV Stopped (11/04/18 1607)  . feeding supplement (OSMOLITE 1.5 CAL) 45 mL/hr at 11/04/18 1800  . ferric gluconate (FERRLECIT/NULECIT) IV  Stopped (11/04/18 1358)  . heparin 1,500 Units/hr (11/04/18 1800)    Assessment: 45 yoF with complicated admission on Eliquis at home for hx of AFib, transitioned to heparin on admission which has been held since 6/29 due to bleeding and bruising at trach site. Palliative care was consulted and it was unclear whether pt would be able to wean off dopamine support, so heparin continued to be held. Pt now tolerating iHD without pressors, so per CCM will resume IV heparin as bleeding issues appear to have resolved.  HL therapeutic at 0.42. CBC stable. No bleeding noted.  Goal of Therapy:  Heparin level 0.3-0.7 units/ml Monitor platelets by anticoagulation protocol: Yes   Plan:  Continue heparin 1500 units/hr Recheck heparin level with AM labs Continue monitoring H&H and platelets  Berenice Bouton, PharmD PGY1 Pharmacy Resident Office phone: [phone] 11/04/2018,8:06 PM

## 2018-11-04 NOTE — Progress Notes (Signed)
RT NOTES:  Patient has tolerated weaning on PS 5/5 this shift. Pt refuses to go on trach collar. Says she does not feel like it today.

## 2018-11-04 NOTE — Consult Note (Signed)
   [facility] Inpatient Consult   11/04/2018  Kathleen [name] [birth year] [id number]     Chart reviewed for progression with LLOS (long length of stay) and to check if potential [place] Management services needed for this [facility] Medicare patient.  Patient is currently being recommended for LTAC ([place]).   Review of patient's medical record reveals MD note on 7/10 shows as follows: 68 y/o obese woman with chronic diastolic heart failure who failed outpatient diuretics management due to chronic kidney disease.  Right heart cath on 5/29 showed markedly elevated biventricular filling pressures with normal cardiac output.  She was on cardiology service, maintained on milrinone and Lasix.  On the morning of 5/30 she went into PEA arrest, CPR performed for 8 minutes.  Intubated and transferred to ICU.  Started on CRRT 5/31.  Extubated 6/3. Re-intubated early am 6/11 for hypercarbic respiratory failure and underwent tracheostomy on 6/19.  Reviewed inpatient TOC team (CM) notes for disposition and shows that expected Discharge Plan is [place] (LTAC);  with palliative note hoping to follow patient at [facility].  If there are any changes in disposition and needs, please refer to [facility] care management for follow-up as appropriate.  Of note, [facility] Care Management services does not replace or interfere with any services that are arranged by transition of care case management or social work.   For questions, please contact:  Edwena Felty A. Renlee Floor, BSN, RN-BC [facility] Liaison Cell: [phone]

## 2018-11-05 LAB — GLUCOSE, CAPILLARY
Glucose-Capillary: 124 mg/dL — ABNORMAL HIGH (ref 70–99)
Glucose-Capillary: 139 mg/dL — ABNORMAL HIGH (ref 70–99)
Glucose-Capillary: 150 mg/dL — ABNORMAL HIGH (ref 70–99)
Glucose-Capillary: 157 mg/dL — ABNORMAL HIGH (ref 70–99)
Glucose-Capillary: 160 mg/dL — ABNORMAL HIGH (ref 70–99)
Glucose-Capillary: 170 mg/dL — ABNORMAL HIGH (ref 70–99)

## 2018-11-05 LAB — CBC
HCT: 27 % — ABNORMAL LOW (ref 36.0–46.0)
Hemoglobin: 8 g/dL — ABNORMAL LOW (ref 12.0–15.0)
MCH: 29.4 pg (ref 26.0–34.0)
MCHC: 29.6 g/dL — ABNORMAL LOW (ref 30.0–36.0)
MCV: 99.3 fL (ref 80.0–100.0)
Platelets: 195 10*3/uL (ref 150–400)
RBC: 2.72 MIL/uL — ABNORMAL LOW (ref 3.87–5.11)
RDW: 29.1 % — ABNORMAL HIGH (ref 11.5–15.5)
WBC: 5.5 10*3/uL (ref 4.0–10.5)
nRBC: 0 % (ref 0.0–0.2)

## 2018-11-05 LAB — HEPARIN LEVEL (UNFRACTIONATED): Heparin Unfractionated: 0.4 IU/mL (ref 0.30–0.70)

## 2018-11-05 MED ORDER — FAMOTIDINE 40 MG/5ML PO SUSR
20.0000 mg | Freq: Every day | ORAL | Status: DC
  Administered 2018-11-05 – 2018-11-06 (×2): 20 mg
  Filled 2018-11-05 (×3): qty 2.5

## 2018-11-05 NOTE — Progress Notes (Signed)
ANTICOAGULATION CONSULT NOTE - Follow Up Consult  Pharmacy Consult for heparin Indication: atrial fibrillation  Allergies  Allergen Reactions  . Doxycycline     REACTION: Wheals and pruritus    Patient Measurements: Height: 5' (152.4 cm) Weight: 232 lb 2.3 oz (105.3 kg) IBW/kg (Calculated) : 45.5 Heparin Dosing Weight: 72 kg  Vital Signs: Temp: 98.1 F (36.7 C) (07/11 0748) Temp Source: Oral (07/11 0748) BP: 93/40 (07/11 0700) Pulse Rate: 48 (07/11 0700)  Labs: Recent Labs    11/03/18 0503 11/04/18 0500 11/04/18 1853 11/05/18 0315  HGB  --  7.9*  --  8.0*  HCT  --  26.8*  --  27.0*  PLT  --  190  --  195  HEPARINUNFRC  --   --  0.42 0.40  CREATININE 3.03* 4.20*  --  2.98*    Estimated Creatinine Clearance: 20.1 mL/min (A) (by C-G formula based on SCr of 2.98 mg/dL (H)).   Medications:  Scheduled:  . aspirin  81 mg Per Tube Daily  . atorvastatin  40 mg Per Tube q1800  . chlorhexidine gluconate (MEDLINE KIT)  15 mL Mouth Rinse BID  . Chlorhexidine Gluconate Cloth  6 each Topical Daily  . Chlorhexidine Gluconate Cloth  6 each Topical Daily  . Chlorhexidine Gluconate Cloth  6 each Topical Q0600  . [START ON 11/09/2018] darbepoetin (ARANESP) injection - NON-DIALYSIS  200 mcg Subcutaneous Q Wed-1800  . feeding supplement (PRO-STAT SUGAR FREE 64)  30 mL Per Tube TID  . fludrocortisone  0.2 mg Oral Daily  . guaiFENesin  15 mL Per Tube Q6H  . insulin aspart  0-20 Units Subcutaneous Q4H  . insulin detemir  40 Units Subcutaneous BID  . midodrine  20 mg Per Tube TID WC  . polyethylene glycol  17 g Oral Daily  . sodium chloride flush  10-40 mL Intracatheter Q12H   Infusions:  . sodium chloride 250 mL (10/31/18 2358)  . DOPamine 5 mcg/kg/min (11/02/18 0600)  . famotidine (PEPCID) IV Stopped (11/04/18 1607)  . feeding supplement (OSMOLITE 1.5 CAL) 1,000 mL (11/05/18 0200)  . ferric gluconate (FERRLECIT/NULECIT) IV Stopped (11/04/18 1358)  . heparin 1,500 Units/hr  (11/05/18 0600)    Assessment: 24 yoF with complicated admission on Eliquis at home for hx of AFib, transitioned to heparin on admission which has been held since 6/29 due to bleeding and bruising at trach site. Palliative care was consulted and it was unclear whether pt would be able to wean off dopamine support, so heparin continued to be held. Pt now tolerating iHD without pressors, so per CCM will resume IV heparin as bleeding issues appear to have resolved.  HL therapeutic at 0.4. CBC stable. No bleeding noted.  Goal of Therapy:  Heparin level 0.3-0.7 units/ml Monitor platelets by anticoagulation protocol: Yes   Plan:  Continue heparin IV at 1500 units/hr Daily heparin level Continue monitoring H&H and platelets  Vertis Kelch, PharmD PGY2 Cardiology Pharmacy Resident Phone [phone] 11/05/2018       8:11 AM  Please check [email or web address] for unit-specific pharmacist phone numbers

## 2018-11-05 NOTE — Plan of Care (Signed)
On trach collar all day today . Up in chair x 4 hours. Used stedy to mobilize. Still weak, but doing exercises in bed, using  blue bands for upper extremities strengthening.  Should get PEG tube on Monday.

## 2018-11-05 NOTE — Progress Notes (Signed)
Resting in  Bed but moving all extremities, working with blue bands for arm strengthening. On TC all day without tachypnia,  Some DOE, BP staying stable over 100 SBP

## 2018-11-05 NOTE — Progress Notes (Signed)
Up in the chair for 4 hours or so, tolerated well, c/o tired, secretions thick cannot get them up fully. Back to bed with Stedy, suctioned trach, changed dressings foam and trach drain pad.

## 2018-11-05 NOTE — Progress Notes (Signed)
[facility] Progress Note    Assessment/ Plan:   1.  Acute kidney injury Superimposed on CKD IV (baseline 1.7-1.9) likely ATN secondary to shock, anuric: Transitioned to iHD on 6/27. Using right [facility] and on MWF schedule.  She is on midodrine for hypotension at 20 TID and florinef 0.2 mg daily added 7/4.  HD 7/6, 7/8, HD 7/10.  No signs of recovery at present- am not hopeful that she will be liberated from dialysis. Continue supportive care at this present time. Next planned HD 7/13.  2.  Proteus +respiratory culture: s/p cefepime, ended 7/8 per PCCM.    3.  Chronic respiratory failure: Status post tracheostomy.  Currently on vent, per pulmonary, weaning as tolerated.  4.  Acute on chronic diastolic heart failure: Appreciate the expertise of the heart failure team.  Plans to try to correlate bradycardia with hypotension to see if could/ would be a candidate for TVP/ RV micro device--> not a candidate for these interventions per notes; not felt to be a candidate for PPM per EP team.  She is off dopamine at present--> and tolerated dialysis off dopamine yesterday  5.  Anemia due to chronic disease and acute blood loss: Continue ESA, Increase Aranesp to 200 mcg for 7/15, on a course of IV iron.    6.  Bradycardia, PEA cardiac arrest/ Afib :  As above in #4. On hep gtt as well.  7.  Dispo: remains in ICU.  Palliative care discussion noted and greatly appreciated.  Continuing GOC discussions  Subjective:    Dopamine remains off.  Sitting up in the chair this morning!  Smiling and seems to be in better spirits.     Objective:   BP (!) 121/49   Pulse (!) 54   Temp 98.1 F (36.7 C) (Oral)   Resp 20   Ht 5' (1.524 m)   Wt 105.3 kg   SpO2 97%   BMI 45.34 kg/m   Intake/Output Summary (Last 24 hours) at 11/05/2018 0839 Last data filed at 11/05/2018 0600 Gross per 24 hour  Intake 1373.78 ml  Output 3000 ml  Net -1626.22 ml   Weight change:   Physical Exam: Gen: obese,  sitting in chair, NAD HEENT: trach in place, on vent CVS: RRR right now Resp:dim breath sounds bilaterally Abd: obese Ext: legs wrapped in [place] boots today--> LE edema better ACCESS: R IJ TDC  Imaging: No results found.  Labs: BMET Recent Labs  Lab 10/30/18 0353 10/31/18 0521 11/01/18 0408 11/02/18 0343 11/03/18 0503 11/04/18 0500 11/05/18 0315  NA 131* 128* 129* 128* 134* 133* 134*  K 3.7 3.9 3.7 3.8 3.9 4.7 4.1  CL 95* 92* 94* 94* 98 97* 97*  CO2 22 20* 22 21* 24 22 27   GLUCOSE 182* 250* 253* 266* 154* 113* 127*  BUN 70* 91* 55* 72* 45* 71* 34*  CREATININE 4.06* 4.98* 3.48* 4.42* 3.03* 4.20* 2.98*  CALCIUM 8.6* 8.6* 8.2* 8.3* 8.1* 8.6* 8.5*  PHOS 4.0 4.0 2.6 3.7 3.1 4.1 3.0   CBC Recent Labs  Lab 11/01/18 0408 11/02/18 0343 11/04/18 0500 11/05/18 0315  WBC 8.1 7.5 6.4 5.5  HGB 8.0* 8.0* 7.9* 8.0*  HCT 27.5* 27.0* 26.8* 27.0*  MCV 95.5 95.1 96.8 99.3  PLT 117* 138* 190 195    Medications:    . aspirin  81 mg Per Tube Daily  . atorvastatin  40 mg Per Tube q1800  . chlorhexidine gluconate (MEDLINE KIT)  15 mL Mouth Rinse BID  .  Chlorhexidine Gluconate Cloth  6 each Topical Daily  . Chlorhexidine Gluconate Cloth  6 each Topical Daily  . Chlorhexidine Gluconate Cloth  6 each Topical Q0600  . [START ON 11/09/2018] darbepoetin (ARANESP) injection - NON-DIALYSIS  200 mcg Subcutaneous Q Wed-1800  . feeding supplement (PRO-STAT SUGAR FREE 64)  30 mL Per Tube TID  . fludrocortisone  0.2 mg Oral Daily  . guaiFENesin  15 mL Per Tube Q6H  . insulin aspart  0-20 Units Subcutaneous Q4H  . insulin detemir  40 Units Subcutaneous BID  . midodrine  20 mg Per Tube TID WC  . polyethylene glycol  17 g Oral Daily  . sodium chloride flush  10-40 mL Intracatheter Q12H      Madelon Lips, MD [facility] pgr [phone] 11/05/2018, 8:39 AM

## 2018-11-05 NOTE — Progress Notes (Addendum)
PROGRESS NOTE    Kathleen [name]  FVC:[id number]  DOB: 08-21-1950  DOA: 09/22/2018 PCP: Aldine Contes, MD  Brief Narrative: 68 year old female with multiple comorbidities including permanent atrial flutter with baseline bradycardia, hypertension, super morbid obesity, IIDDM, dyslipidemia, obesity hypoventilation syndrome (on home O2 with no OSA bypriorPSG but showed nocturnal hypoxemia), CKD stage IV, chronic diastolic CHF, chronic edema with prolonged hospitalization (since May 28) who initially presented with acute on chronic heart failure and AKI, admitted to cardiology service/heart failure team after failing outpatient diuretic therapy in the setting of CKD. Right heart cath on 5/29 showed markedly elevated biventricular filling pressures with normal cardiac output.  She was on cardiology service, maintained on milrinone and Lasix.  On the morning of 5/30 she went into PEA arrest, CPR performed for 8 minutes.  Intubated and transferred to ICU.  Started on CRRT 5/31.  Extubated 6/3.  Re-intubated early am 6/11 for hypercarbic respiratory failure and underwent tracheostomy on 6/19.   5/31 Milrinone stopped due to ectopy; brady episode- amio stopped 6/02 Attempt at SBT, chest x-ray with worsening pulmonary edema, CVVHD for volume removal 6/03 Extubated > remain on CRRT.  Refused nocturnal BiPAP 6/04 Refused nocturnal BiPAP 6/10 off CRRT 6/11 Intubated early am with hypercarbia, concern for RUL PNA; abx broadened; restarted on CRRT 6/16  No events overnight, weaning on high PS 6/17  failed vent wean, remains on levophed, midodrine and CRRT 6/18  Weaned for 5 hours on PSV 8/5 before requiring full support; ongoing CRRT, remains on levophed 14 mcg 6/19 - TRACH + Dr Erskine Emery 6/20 -. On IV heparin gt, levophed gtt with midodrine. Off fent gtt. Pressopr needs down 6/26 - TCT tolerated for the past 24 hours  6/27 - Stable on TCT now >48hrs, tolerated first session of iHD  6/30 hypotension /  bradycardia overnight, started on dopamine 7/2 Placed on ATC at 40% fiO2  Patient been on NG tube feeds, now on trach collar, 10 L O2, mentating well.  Requested to be transferred out of ICU and hospitalist service contacted yesterday for assuming service once out of ICU.  Subjective:  Patient been on NG tube feeds for more than a month now.  She appears comfortable and sitting in a chair.  Has Unna boot/Ace wraps in lower extremities for leg edema.  No underlying open wounds per bedside nurse.  I's and O's show -1600 mL.  SBP fluctuating 90s to 110s.  She states her son and daughter are her next of kin and live in [place].  Patient communicates by nodding and writing on a paper.  Objective: Vitals:   11/05/18 0700 11/05/18 0748 11/05/18 0829 11/05/18 0900  BP: (!) 93/40  (!) 121/49 (!) 109/45  Pulse: (!) 48  (!) 54 (!) 52  Resp: 17  20 (!) 22  Temp:  98.1 F (36.7 C)    TempSrc:  Oral    SpO2: 100%  97% 100%  Weight:      Height:        Intake/Output Summary (Last 24 hours) at 11/05/2018 1036 Last data filed at 11/05/2018 0700 Gross per 24 hour  Intake 1408.78 ml  Output 3000 ml  Net -1591.22 ml   Filed Weights   11/04/18 0900 11/04/18 1207 11/05/18 0500  Weight: 108.7 kg 105.7 kg 105.3 kg    Physical Examination:  General exam: Appears calm and comfortable, status post trach, + NG tube Respiratory system: Clear to auscultation. Respiratory effort normal. Cardiovascular system: S1 & S2  heard, RRR. No JVD, murmurs, rubs, gallops or clicks.  2+ pitting leg edema with Unna boots Gastrointestinal system: Abdomen is obese, soft and nontender. No organomegaly or masses felt. Normal bowel sounds heard. Central nervous system: Alert and oriented. No focal neurological deficits. Extremities: Symmetric 5 x 5 power. Skin: Skin tear on upper back with local dressing Psychiatry: Judgement and insight appear normal. Mood & affect appropriate.     Data Reviewed: I have  personally reviewed following labs and imaging studies  CBC: Recent Labs  Lab 10/31/18 0521 11/01/18 0408 11/02/18 0343 11/04/18 0500 11/05/18 0315  WBC 8.5 8.1 7.5 6.4 5.5  HGB 8.2* 8.0* 8.0* 7.9* 8.0*  HCT 27.9* 27.5* 27.0* 26.8* 27.0*  MCV 94.6 95.5 95.1 96.8 99.3  PLT 101* 117* 138* 190 627   Basic Metabolic Panel: Recent Labs  Lab 11/01/18 0408 11/02/18 0343 11/03/18 0503 11/04/18 0500 11/05/18 0315  NA 129* 128* 134* 133* 134*  K 3.7 3.8 3.9 4.7 4.1  CL 94* 94* 98 97* 97*  CO2 22 21* 24 22 27   GLUCOSE 253* 266* 154* 113* 127*  BUN 55* 72* 45* 71* 34*  CREATININE 3.48* 4.42* 3.03* 4.20* 2.98*  CALCIUM 8.2* 8.3* 8.1* 8.6* 8.5*  PHOS 2.6 3.7 3.1 4.1 3.0   GFR: Estimated Creatinine Clearance: 20.1 mL/min (A) (by C-G formula based on SCr of 2.98 mg/dL (H)). Liver Function Tests: Recent Labs  Lab 11/01/18 0408 11/02/18 0343 11/03/18 0503 11/04/18 0500 11/05/18 0315  ALBUMIN 2.3* 2.1* 2.1* 2.2* 2.5*   No results for input(s): LIPASE, AMYLASE in the last 168 hours. No results for input(s): AMMONIA in the last 168 hours. Coagulation Profile: No results for input(s): INR, PROTIME in the last 168 hours. Cardiac Enzymes: No results for input(s): CKTOTAL, CKMB, CKMBINDEX, TROPONINI in the last 168 hours. BNP (last 3 results) No results for input(s): PROBNP in the last 8760 hours. HbA1C: No results for input(s): HGBA1C in the last 72 hours. CBG: Recent Labs  Lab 11/04/18 1609 11/04/18 2025 11/04/18 2312 11/05/18 0439 11/05/18 0750  GLUCAP 159* 185* 149* 124* 150*   Lipid Profile: No results for input(s): CHOL, HDL, LDLCALC, TRIG, CHOLHDL, LDLDIRECT in the last 72 hours. Thyroid Function Tests: No results for input(s): TSH, T4TOTAL, FREET4, T3FREE, THYROIDAB in the last 72 hours. Anemia Panel: No results for input(s): VITAMINB12, FOLATE, FERRITIN, TIBC, IRON, RETICCTPCT in the last 72 hours. Sepsis Labs: Recent Labs  Lab 10/30/18 1130 10/31/18 0521  11/01/18 0408  PROCALCITON <0.10 <0.10 <0.10    Recent Results (from the past 240 hour(s))  Culture, respiratory (non-expectorated)     Status: None   Collection Time: 10/27/18  4:06 PM   Specimen: Tracheal Aspirate; Respiratory  Result Value Ref Range Status   Specimen Description TRACHEAL ASPIRATE  Final   Special Requests NONE  Final   Gram Stain   Final    RARE WBC PRESENT, PREDOMINANTLY PMN MODERATE GRAM POSITIVE RODS FEW GRAM NEGATIVE RODS RARE GRAM POSITIVE COCCI Performed at [facility], [place] [address], [place], [place] [address]    Culture   Final    MODERATE PROTEUS MIRABILIS FEW ENTEROCOCCUS FAECALIS    Report Status 10/30/2018 FINAL  Final   Organism ID, Bacteria PROTEUS MIRABILIS  Final   Organism ID, Bacteria ENTEROCOCCUS FAECALIS  Final      Susceptibility   Enterococcus faecalis - MIC*    AMPICILLIN <=2 SENSITIVE Sensitive     VANCOMYCIN 1 SENSITIVE Sensitive     GENTAMICIN  SYNERGY SENSITIVE Sensitive     * FEW ENTEROCOCCUS FAECALIS   Proteus mirabilis - MIC*    AMPICILLIN <=2 SENSITIVE Sensitive     CEFAZOLIN <=4 SENSITIVE Sensitive     CEFEPIME <=1 SENSITIVE Sensitive     CEFTAZIDIME <=1 SENSITIVE Sensitive     CEFTRIAXONE <=1 SENSITIVE Sensitive     CIPROFLOXACIN <=0.25 SENSITIVE Sensitive     GENTAMICIN <=1 SENSITIVE Sensitive     IMIPENEM 2 SENSITIVE Sensitive     TRIMETH/SULFA <=20 SENSITIVE Sensitive     AMPICILLIN/SULBACTAM <=2 SENSITIVE Sensitive     PIP/TAZO <=4 SENSITIVE Sensitive     * MODERATE PROTEUS MIRABILIS      Radiology Studies: No results found.      Scheduled Meds: . aspirin  81 mg Per Tube Daily  . atorvastatin  40 mg Per Tube q1800  . chlorhexidine gluconate (MEDLINE KIT)  15 mL Mouth Rinse BID  . Chlorhexidine Gluconate Cloth  6 each Topical Daily  . Chlorhexidine Gluconate Cloth  6 each Topical Daily  . Chlorhexidine Gluconate Cloth  6 each Topical Q0600  . [START ON 11/09/2018] darbepoetin (ARANESP)  injection - NON-DIALYSIS  200 mcg Subcutaneous Q Wed-1800  . famotidine  20 mg Per Tube Daily  . feeding supplement (PRO-STAT SUGAR FREE 64)  30 mL Per Tube TID  . fludrocortisone  0.2 mg Oral Daily  . guaiFENesin  15 mL Per Tube Q6H  . insulin aspart  0-20 Units Subcutaneous Q4H  . insulin detemir  40 Units Subcutaneous BID  . midodrine  20 mg Per Tube TID WC  . polyethylene glycol  17 g Oral Daily  . sodium chloride flush  10-40 mL Intracatheter Q12H   Continuous Infusions: . sodium chloride 250 mL (10/31/18 2358)  . DOPamine 5 mcg/kg/min (11/02/18 0600)  . feeding supplement (OSMOLITE 1.5 CAL) 1,000 mL (11/05/18 0200)  . ferric gluconate (FERRLECIT/NULECIT) IV Stopped (11/04/18 1358)  . heparin 1,500 Units/hr (11/05/18 0600)    Assessment & Plan:    1.  Acute on chronic respiratory failure: In the setting of underlying obesity hypoventilation syndrome with acute on chronic diastolic CHF.  Now status post trach.  Trach collar during the day and still requiring vent at night.  PCCM will follow and management.  Cardiology following along.  Continue diuresis/dialysis as tolerated by blood pressure, monitor I's and O's.  Taper O2 as tolerated.  2.  Acute on chronic kidney disease stage IV: Now hemodialysis dependent and nephrology following, they are not hopeful of significant recovery or liberation from dialysis.  Next HD session on Monday  3.  Tracheitis/ventilator associated pneumonia: Patient had respiratory culture positive for Proteus and completed treatment with cefepime on July 8.  4.  Bradycardia/hypotension: Status post PEA arrest during the hospital course.  Not a candidate for intervention/pacemaker per EP team.  Off vasopressors for 2 days.  Now on Midodrin.  Tolerating dialysis so far and asymptomatic with heart rate 40s to 50s.  5.  Chronic atrial fibrillation: Anticoagulation was held in the setting of bleeding around trach site during the hospital course.  Now on heparin  drip and tolerating well.  Can transition back to oral anticoagulant once plan for surgical interventions (like PEG) and.  6.  Chronic leg edema: On Unna boots/Ace wraps.  Continue diuresis/dialysis as tolerated.    7.  Diabetes mellitus: On Lantus/sliding scale insulin.  8.  Chronic anemia: Likely anemia of chronic disease in setting of problem #2.  Received IV iron on  July 10.  Nephrology following.    9. Goals of care: Palliative care team following and in discussions with family.  Patient remains full code at this time.  Mentating well and seems to lean towards PEG if needed.  DVT prophylaxis: Heparin drip Code Status: Full code Disposition Plan: Remains on ICU bed as needing vent at night     LOS: 44 days    Time spent: 45 minutes    Guilford Shi, MD [facility] Pager [phone]  If 7PM-7AM, please contact night-coverage [email or web address] Password [facility] 11/05/2018, 10:36 AM

## 2018-11-06 ENCOUNTER — Inpatient Hospital Stay (HOSPITAL_COMMUNITY): Payer: Medicare HMO

## 2018-11-06 LAB — RENAL FUNCTION PANEL
Albumin: 2.5 g/dL — ABNORMAL LOW (ref 3.5–5.0)
Albumin: 2.3 g/dL — ABNORMAL LOW (ref 3.5–5.0)
Anion gap: 10 (ref 5–15)
Anion gap: 13 (ref 5–15)
BUN: 34 mg/dL — ABNORMAL HIGH (ref 8–23)
BUN: 58 mg/dL — ABNORMAL HIGH (ref 8–23)
CO2: 27 mmol/L (ref 22–32)
CO2: 25 mmol/L (ref 22–32)
Calcium: 8.5 mg/dL — ABNORMAL LOW (ref 8.9–10.3)
Calcium: 8.8 mg/dL — ABNORMAL LOW (ref 8.9–10.3)
Chloride: 97 mmol/L — ABNORMAL LOW (ref 98–111)
Chloride: 95 mmol/L — ABNORMAL LOW (ref 98–111)
Creatinine, Ser: 2.98 mg/dL — ABNORMAL HIGH (ref 0.44–1.00)
Creatinine, Ser: 4.05 mg/dL — ABNORMAL HIGH (ref 0.44–1.00)
GFR calc Af Amer: 18 mL/min — ABNORMAL LOW (ref >60)
GFR calc Af Amer: 12 mL/min — ABNORMAL LOW (ref >60)
GFR calc non Af Amer: 16 mL/min — ABNORMAL LOW (ref >60)
GFR calc non Af Amer: 11 mL/min — ABNORMAL LOW (ref >60)
Glucose, Bld: 127 mg/dL — ABNORMAL HIGH (ref 70–99)
Glucose, Bld: 105 mg/dL — ABNORMAL HIGH (ref 70–99)
Phosphorus: 3 mg/dL (ref 2.5–4.6)
Phosphorus: 4.1 mg/dL (ref 2.5–4.6)
Potassium: 4.1 mmol/L (ref 3.5–5.1)
Potassium: 4.7 mmol/L (ref 3.5–5.1)
Sodium: 134 mmol/L — ABNORMAL LOW (ref 135–145)
Sodium: 133 mmol/L — ABNORMAL LOW (ref 135–145)

## 2018-11-06 LAB — CBC
HCT: 26.9 % — ABNORMAL LOW (ref 36.0–46.0)
Hemoglobin: 7.9 g/dL — ABNORMAL LOW (ref 12.0–15.0)
MCH: 28.7 pg (ref 26.0–34.0)
MCHC: 29.4 g/dL — ABNORMAL LOW (ref 30.0–36.0)
MCV: 97.8 fL (ref 80.0–100.0)
Platelets: 209 10*3/uL (ref 150–400)
RBC: 2.75 MIL/uL — ABNORMAL LOW (ref 3.87–5.11)
RDW: 28.7 % — ABNORMAL HIGH (ref 11.5–15.5)
WBC: 5.5 10*3/uL (ref 4.0–10.5)
nRBC: 0 % (ref 0.0–0.2)

## 2018-11-06 LAB — GLUCOSE, CAPILLARY
Glucose-Capillary: 102 mg/dL — ABNORMAL HIGH (ref 70–99)
Glucose-Capillary: 115 mg/dL — ABNORMAL HIGH (ref 70–99)
Glucose-Capillary: 138 mg/dL — ABNORMAL HIGH (ref 70–99)
Glucose-Capillary: 76 mg/dL (ref 70–99)
Glucose-Capillary: 79 mg/dL (ref 70–99)
Glucose-Capillary: 92 mg/dL (ref 70–99)

## 2018-11-06 LAB — HEPARIN LEVEL (UNFRACTIONATED): Heparin Unfractionated: 0.37 IU/mL (ref 0.30–0.70)

## 2018-11-06 MED ORDER — MIDODRINE HCL 5 MG PO TABS: 20.0000 mg | ORAL_TABLET | Freq: Three times a day (TID) | ORAL | Status: DC

## 2018-11-06 MED ORDER — MIDODRINE HCL 5 MG PO TABS
10.0000 mg | ORAL_TABLET | Freq: Three times a day (TID) | ORAL | Status: DC
  Administered 2018-11-09 – 2018-11-17 (×23): 10 mg via ORAL
  Filled 2018-11-06 (×22): qty 2

## 2018-11-06 NOTE — Plan of Care (Signed)
Progressing with respiratory weaning on trach collar during day, vent at night, plan to get PEG in the next day or two and go to acute long term care, care management on case. Pallative into see, patient is a full code and wants aggressive treatment at this time. For dialysis in the AM

## 2018-11-06 NOTE — Progress Notes (Signed)
ANTICOAGULATION CONSULT NOTE - Follow Up Consult  Pharmacy Consult for heparin Indication: atrial fibrillation  Allergies  Allergen Reactions  . Doxycycline     REACTION: Wheals and pruritus    Patient Measurements: Height: 5' (152.4 cm) Weight: 232 lb 2.3 oz (105.3 kg) IBW/kg (Calculated) : 45.5 Heparin Dosing Weight: 72 kg  Vital Signs: Temp: 98.5 F (36.9 C) (07/12 0751) Temp Source: Oral (07/12 0751) BP: 104/47 (07/12 0753) Pulse Rate: 56 (07/12 0600)  Labs: Recent Labs    11/04/18 0500 11/04/18 1853 11/05/18 0315 11/06/18 0419  HGB 7.9*  --  8.0* 7.9*  HCT 26.8*  --  27.0* 26.9*  PLT 190  --  195 209  HEPARINUNFRC  --  0.42 0.40 0.37  CREATININE 4.20*  --  2.98* 4.05*    Estimated Creatinine Clearance: 14.8 mL/min (A) (by C-G formula based on SCr of 4.05 mg/dL (H)).   Medications:  Scheduled:  . aspirin  81 mg Per Tube Daily  . atorvastatin  40 mg Per Tube q1800  . chlorhexidine gluconate (MEDLINE KIT)  15 mL Mouth Rinse BID  . Chlorhexidine Gluconate Cloth  6 each Topical Daily  . Chlorhexidine Gluconate Cloth  6 each Topical Daily  . [START ON 11/09/2018] darbepoetin (ARANESP) injection - NON-DIALYSIS  200 mcg Subcutaneous Q Wed-1800  . famotidine  20 mg Per Tube Daily  . feeding supplement (PRO-STAT SUGAR FREE 64)  30 mL Per Tube TID  . fludrocortisone  0.2 mg Oral Daily  . guaiFENesin  15 mL Per Tube Q6H  . insulin aspart  0-20 Units Subcutaneous Q4H  . insulin detemir  40 Units Subcutaneous BID  . midodrine  20 mg Per Tube TID WC  . polyethylene glycol  17 g Oral Daily  . sodium chloride flush  10-40 mL Intracatheter Q12H   Infusions:  . sodium chloride 250 mL (10/31/18 2358)  . DOPamine 5 mcg/kg/min (11/02/18 0600)  . feeding supplement (OSMOLITE 1.5 CAL) 1,000 mL (11/06/18 0735)  . ferric gluconate (FERRLECIT/NULECIT) IV Stopped (11/04/18 1358)  . heparin 1,500 Units/hr (11/06/18 0000)    Assessment: 42 yoF with complicated admission on  Eliquis at home for hx of AFib, transitioned to heparin on admission which has been held since 6/29 due to bleeding and bruising at trach site. Palliative care was consulted and it was unclear whether pt would be able to wean off dopamine support, so heparin continued to be held. Pt now tolerating iHD without pressors, so per CCM will resume IV heparin as bleeding issues appear to have resolved.  HL therapeutic at 0.37. CBC stable. No bleeding noted.  Goal of Therapy:  Heparin level 0.3-0.7 units/ml Monitor platelets by anticoagulation protocol: Yes   Plan:  Continue heparin IV at 1500 units/hr Daily heparin level Continue monitoring H&H and platelets PEG tube to be placed 7/13 - follow up transition back to [place], PharmD PGY2 Cardiology Pharmacy Resident Phone [phone] 11/06/2018       7:57 AM  Please check [email or web address] for unit-specific pharmacist phone numbers

## 2018-11-06 NOTE — Progress Notes (Signed)
Cortrak occluded, attempted to open with air bolus, fluids. D?C cortrak replaced by 12 F salem Sump tube to give meds and feeding. Awaiting KUB

## 2018-11-06 NOTE — Progress Notes (Signed)
NG found on floor, patient had been sleeping.  Reinserted 12 F NG right nare. KUB ordered, Ng secured with tape.

## 2018-11-06 NOTE — Progress Notes (Signed)
PROGRESS NOTE    Kathleen [name]  CNO:[id number]  DOB: [birth year]  DOA: 09/22/2018 PCP: Aldine Contes, MD  Brief Narrative: 68 year old female with multiple comorbidities including permanent atrial flutter with baseline bradycardia, hypertension, super morbid obesity, IIDDM, dyslipidemia, obesity hypoventilation syndrome (on home O2 with no OSA bypriorPSG but showed nocturnal hypoxemia), CKD stage IV, chronic diastolic CHF, chronic edema with prolonged hospitalization (since May 28) who initially presented with acute on chronic heart failure and AKI, admitted to cardiology service/heart failure team after failing outpatient diuretic therapy in the setting of CKD. Right heart cath on 5/29 showed markedly elevated biventricular filling pressures with normal cardiac output.  She was on cardiology service, maintained on milrinone and Lasix.  On the morning of 5/30 she went into PEA arrest, CPR performed for 8 minutes.  Intubated and transferred to ICU.  Started on CRRT 5/31.  Extubated 6/3.  Re-intubated early am 6/11 for hypercarbic respiratory failure and underwent tracheostomy on 6/19.   5/31 Milrinone stopped due to ectopy; brady episode- amio stopped 6/02 Attempt at SBT, chest x-ray with worsening pulmonary edema, CVVHD for volume removal 6/03 Extubated > remain on CRRT.  Refused nocturnal BiPAP 6/04 Refused nocturnal BiPAP 6/10 off CRRT 6/11 Intubated early am with hypercarbia, concern for RUL PNA; abx broadened; restarted on CRRT 6/16  No events overnight, weaning on high PS 6/17  failed vent wean, remains on levophed, midodrine and CRRT 6/18  Weaned for 5 hours on PSV 8/5 before requiring full support; ongoing CRRT, remains on levophed 14 mcg 6/19 - TRACH + Dr Erskine Emery 6/20 -. On IV heparin gt, levophed gtt with midodrine. Off fent gtt. Pressopr needs down 6/26 - TCT tolerated for the past 24 hours  6/27 - Stable on TCT now >48hrs, tolerated first session of iHD  6/30 hypotension /  bradycardia overnight, started on dopamine 7/2 Placed on ATC at 40% fiO2  Patient been on NG tube feeds, now on trach collar, 10 L O2, mentating well.  Requested to be transferred out of ICU and hospitalist service contacted yesterday for assuming service once out of ICU.  Subjective:  She appears comfortable. Cortrak replaced due to clogging.  I's and O's show -1500 mL.  SBP fluctuating 90s to 120s.   Patient communicates by nodding and writing on a paper.  Objective: Vitals:   11/06/18 1532 11/06/18 1600 11/06/18 1637 11/06/18 1700  BP: (!) 120/48 (!) 122/39  (!) 90/41  Pulse:  (!) 51  (!) 50  Resp: 19 19  19   Temp:   98.3 F (36.8 C)   TempSrc:   Oral   SpO2:  95%  94%  Weight:      Height:        Intake/Output Summary (Last 24 hours) at 11/06/2018 1745 Last data filed at 11/06/2018 1155 Gross per 24 hour  Intake 164.92 ml  Output -  Net 164.92 ml   Filed Weights   11/04/18 0900 11/04/18 1207 11/05/18 0500  Weight: 108.7 kg 105.7 kg 105.3 kg    Physical Examination:  General exam: Appears calm and comfortable, status post trach, + NG tube Respiratory system: Clear to auscultation. Respiratory effort normal. Cardiovascular system: S1 & S2 heard, RRR. No JVD, murmurs, rubs, gallops or clicks.  2+ pitting leg edema with Unna boots Gastrointestinal system: Abdomen is obese, soft and nontender. No organomegaly or masses felt. Normal bowel sounds heard. Central nervous system: Alert and oriented. No focal neurological deficits. Extremities: Symmetric 5 x 5 power. Skin:  Skin tear on upper back with local dressing Psychiatry: Judgement and insight appear normal. Mood & affect appropriate.     Data Reviewed: I have personally reviewed following labs and imaging studies  CBC: Recent Labs  Lab 11/01/18 0408 11/02/18 0343 11/04/18 0500 11/05/18 0315 11/06/18 0419  WBC 8.1 7.5 6.4 5.5 5.5  HGB 8.0* 8.0* 7.9* 8.0* 7.9*  HCT 27.5* 27.0* 26.8* 27.0* 26.9*  MCV 95.5 95.1  96.8 99.3 97.8  PLT 117* 138* 190 195 338   Basic Metabolic Panel: Recent Labs  Lab 11/02/18 0343 11/03/18 0503 11/04/18 0500 11/05/18 0315 11/06/18 0419  NA 128* 134* 133* 134* 133*  K 3.8 3.9 4.7 4.1 4.7  CL 94* 98 97* 97* 95*  CO2 21* 24 22 27 25   GLUCOSE 266* 154* 113* 127* 105*  BUN 72* 45* 71* 34* 58*  CREATININE 4.42* 3.03* 4.20* 2.98* 4.05*  CALCIUM 8.3* 8.1* 8.6* 8.5* 8.8*  PHOS 3.7 3.1 4.1 3.0 4.1   GFR: Estimated Creatinine Clearance: 14.8 mL/min (A) (by C-G formula based on SCr of 4.05 mg/dL (H)). Liver Function Tests: Recent Labs  Lab 11/02/18 0343 11/03/18 0503 11/04/18 0500 11/05/18 0315 11/06/18 0419  ALBUMIN 2.1* 2.1* 2.2* 2.5* 2.3*   No results for input(s): LIPASE, AMYLASE in the last 168 hours. No results for input(s): AMMONIA in the last 168 hours. Coagulation Profile: No results for input(s): INR, PROTIME in the last 168 hours. Cardiac Enzymes: No results for input(s): CKTOTAL, CKMB, CKMBINDEX, TROPONINI in the last 168 hours. BNP (last 3 results) No results for input(s): PROBNP in the last 8760 hours. HbA1C: No results for input(s): HGBA1C in the last 72 hours. CBG: Recent Labs  Lab 11/05/18 2344 11/06/18 0423 11/06/18 0753 11/06/18 1139 11/06/18 1638  GLUCAP 157* 92 79 76 115*   Lipid Profile: No results for input(s): CHOL, HDL, LDLCALC, TRIG, CHOLHDL, LDLDIRECT in the last 72 hours. Thyroid Function Tests: No results for input(s): TSH, T4TOTAL, FREET4, T3FREE, THYROIDAB in the last 72 hours. Anemia Panel: No results for input(s): VITAMINB12, FOLATE, FERRITIN, TIBC, IRON, RETICCTPCT in the last 72 hours. Sepsis Labs: Recent Labs  Lab 10/31/18 0521 11/01/18 0408  PROCALCITON <0.10 <0.10    No results found for this or any previous visit (from the past 240 hour(s)).    Radiology Studies: Dg Abd 1 View  Result Date: 11/06/2018 CLINICAL DATA:  Nasogastric tube placement EXAM: ABDOMEN - 1 VIEW COMPARISON:  September 30, 2018  FINDINGS: Nasogastric tube tip and side port are in the in stomach. The visualized bowel gas pattern is unremarkable without evidence of bowel obstruction or free air. There are surgical clips in right upper abdomen. There is cardiomegaly. There is airspace consolidation in the medial right base. Tracheostomy catheter tip is 4.6 cm above the carina. Central catheter tip is in the superior vena cava. IMPRESSION: Nasogastric tube tip and port in stomach. Visualized bowel gas pattern unremarkable. Stable cardiomegaly. Airspace consolidation consistent with pneumonia medial right base. Electronically Signed   By: Lowella Grip III M.D.   On: 11/06/2018 10:35        Scheduled Meds: . aspirin  81 mg Per Tube Daily  . atorvastatin  40 mg Per Tube q1800  . chlorhexidine gluconate (MEDLINE KIT)  15 mL Mouth Rinse BID  . Chlorhexidine Gluconate Cloth  6 each Topical Daily  . Chlorhexidine Gluconate Cloth  6 each Topical Daily  . [START ON 11/09/2018] darbepoetin (ARANESP) injection - NON-DIALYSIS  200 mcg Subcutaneous Q Wed-1800  .  famotidine  20 mg Per Tube Daily  . feeding supplement (PRO-STAT SUGAR FREE 64)  30 mL Per Tube TID  . fludrocortisone  0.2 mg Oral Daily  . guaiFENesin  15 mL Per Tube Q6H  . insulin aspart  0-20 Units Subcutaneous Q4H  . insulin detemir  40 Units Subcutaneous BID  . midodrine  20 mg Oral TID WC  . polyethylene glycol  17 g Oral Daily  . sodium chloride flush  10-40 mL Intracatheter Q12H   Continuous Infusions: . sodium chloride 250 mL (10/31/18 2358)  . DOPamine 5 mcg/kg/min (11/02/18 0600)  . feeding supplement (OSMOLITE 1.5 CAL) 1,000 mL (11/06/18 0735)  . ferric gluconate (FERRLECIT/NULECIT) IV Stopped (11/04/18 1358)  . heparin 1,500 Units/hr (11/06/18 1147)    Assessment & Plan:    1.  Acute on chronic respiratory failure: In the setting of underlying obesity hypoventilation syndrome with acute on chronic diastolic CHF.  Now status post trach.  Trach  collar during the day with 40% Fi02 and still requiring vent at night.  PCCM managing vent.  Cardiology following along.  Continue diuresis/dialysis as tolerated by blood pressure, monitor I's and O's.  Taper O2 as tolerated.  2.  Acute on chronic kidney disease stage IV: Now hemodialysis dependent and nephrology following, they are not hopeful of significant recovery or liberation from dialysis.  Next HD session on Monday  3.  Tracheitis/ventilator associated pneumonia: Patient had respiratory culture positive for Proteus and completed treatment with cefepime on July 8.  4.  Bradycardia/hypotension: Status post PEA arrest during the hospital course.  Not a candidate for intervention/pacemaker per EP team.  Off vasopressors for 2 days.  Now on Midodrin: will taper off as unable to crush to give via NG tube.  Tolerating dialysis so far and asymptomatic with heart rate 40s to 50s.  5.  Chronic atrial fibrillation: Anticoagulation was held in the setting of bleeding around trach site during the hospital course.  Now on heparin drip and tolerating well.  Can transition back to oral anticoagulant once plan for surgical interventions (like PEG) and.  6.  Chronic leg edema: On Unna boots/Ace wraps.  Continue diuresis/dialysis as tolerated.    7.  Diabetes mellitus: On Lantus/sliding scale insulin.  8.  Chronic anemia: Likely anemia of chronic disease in setting of problem #2.  Received IV iron on July 10.  Nephrology following.    9. Dysphagia: Will have Speech f/u and recommend regarding need for pursuing PEG. Will d/w family and arrange through IR in next 24 -48 hours.  Goals of care: Palliative care team following and in discussions with family.  She states her son and daughter are her next of kin and live in [place]. Patient remains full code at this time.  Mentating well and seems to lean towards PEG if needed.  DVT prophylaxis: Heparin drip Code Status: Full code Disposition Plan: Remains  on ICU bed as needing vent at night. Weaning trial tonight. Can go back to [facility] after night vent weaned off & PEG placed for ongoing HD there.      LOS: 45 days    Time spent: 35 minutes    Guilford Shi, MD [facility] Pager [phone]  If 7PM-7AM, please contact night-coverage [email or web address] Password [facility] 11/06/2018, 5:45 PM

## 2018-11-06 NOTE — Progress Notes (Signed)
Called Xray, still awaiting KUB to give medications.  Discussed with patient earlier s/s of hypoglycemia.

## 2018-11-06 NOTE — Progress Notes (Addendum)
[facility] Progress Note    Assessment/ Plan:   1.  Acute kidney injury Superimposed on CKD IV (baseline 1.7-1.9) likely ATN secondary to shock, anuric: Transitioned to iHD on 6/27. Using right [facility] and on MWF schedule.  She is on midodrine for hypotension at 20 TID and florinef 0.2 mg daily added 7/4.  HD 7/6, 7/8, HD 7/10.  No signs of recovery at present- am not hopeful that she will be liberated from dialysis. Continue supportive care at this present time. Next planned HD 7/13.  Will use midodrine before and will have albumin if needed to go for a healthy UF goal.  Has been using 3K bath, recently K up to 4.7 today, will use 2K bath tomorrow 7/13.  2.  Proteus +respiratory culture: s/p cefepime, ended 7/8 per PCCM.    3.  Chronic respiratory failure: Status post tracheostomy.  Currently on vent, per pulmonary, weaning as tolerated.  4.  Acute on chronic diastolic heart failure: Appreciate the expertise of the heart failure team.  Plans to try to correlate bradycardia with hypotension to see if could/ would be a candidate for TVP/ RV micro device--> not a candidate for these interventions per notes; not felt to be a candidate for PPM per EP team.  She is off dopamine at present--> and tolerated dialysis off dopamine 7/10.  5.  Anemia due to chronic disease and acute blood loss: Continue ESA, Increase Aranesp to 200 mcg for 7/15, on a course of IV iron.    6.  Bradycardia, PEA cardiac arrest/ Afib :  As above in #4. On hep gtt as well.  7.  Dispo: remains in ICU.  Palliative care discussion noted and greatly appreciated.  Continuing GOC discussions--> given improvement over weekend, looks like a good LTAC candidate.  Subjective:    Dopamine remains off.  Sitting up in the chair this morning again and is on trach collar.  Seems to be tolerating well.     Objective:   BP (!) 104/47   Pulse (!) 56   Temp 98.5 F (36.9 C) (Oral)   Resp 18   Ht 5' (1.524 m)   Wt 105.3  kg   SpO2 97%   BMI 45.34 kg/m   Intake/Output Summary (Last 24 hours) at 11/06/2018 0854 Last data filed at 11/06/2018 0000 Gross per 24 hour  Intake 764.81 ml  Output -  Net 764.81 ml   Weight change:   Physical Exam: Gen: obese, sitting in chair, NAD HEENT: trach in place, on TC CVS: bradycardic, seems RRR Resp:dim breath sounds bilaterally Abd: obese Ext: legs wrapped in Unna boots today--> LE edema better ACCESS: R IJ TDC  Imaging: No results found.  Labs: BMET Recent Labs  Lab 10/31/18 0521 11/01/18 0408 11/02/18 0343 11/03/18 0503 11/04/18 0500 11/05/18 0315 11/06/18 0419  NA 128* 129* 128* 134* 133* 134* 133*  K 3.9 3.7 3.8 3.9 4.7 4.1 4.7  CL 92* 94* 94* 98 97* 97* 95*  CO2 20* 22 21* 24 22 27 25   GLUCOSE 250* 253* 266* 154* 113* 127* 105*  BUN 91* 55* 72* 45* 71* 34* 58*  CREATININE 4.98* 3.48* 4.42* 3.03* 4.20* 2.98* 4.05*  CALCIUM 8.6* 8.2* 8.3* 8.1* 8.6* 8.5* 8.8*  PHOS 4.0 2.6 3.7 3.1 4.1 3.0 4.1   CBC Recent Labs  Lab 11/02/18 0343 11/04/18 0500 11/05/18 0315 11/06/18 0419  WBC 7.5 6.4 5.5 5.5  HGB 8.0* 7.9* 8.0* 7.9*  HCT 27.0* 26.8* 27.0* 26.9*  MCV 95.1 96.8 99.3 97.8  PLT 138* 190 195 209    Medications:    . aspirin  81 mg Per Tube Daily  . atorvastatin  40 mg Per Tube q1800  . chlorhexidine gluconate (MEDLINE KIT)  15 mL Mouth Rinse BID  . Chlorhexidine Gluconate Cloth  6 each Topical Daily  . Chlorhexidine Gluconate Cloth  6 each Topical Daily  . [START ON 11/09/2018] darbepoetin (ARANESP) injection - NON-DIALYSIS  200 mcg Subcutaneous Q Wed-1800  . famotidine  20 mg Per Tube Daily  . feeding supplement (PRO-STAT SUGAR FREE 64)  30 mL Per Tube TID  . fludrocortisone  0.2 mg Oral Daily  . guaiFENesin  15 mL Per Tube Q6H  . insulin aspart  0-20 Units Subcutaneous Q4H  . insulin detemir  40 Units Subcutaneous BID  . midodrine  20 mg Per Tube TID WC  . polyethylene glycol  17 g Oral Daily  . sodium chloride flush  10-40 mL  Intracatheter Q12H      Madelon Lips, MD [facility] pgr [phone] 11/06/2018, 8:54 AM

## 2018-11-06 NOTE — Progress Notes (Addendum)
Palliative:  HPI: 68 yo female with past medical history of CKD 4, diastolic CHF, morbid obesity with hypoventilation syndrome, diabetes, atrial fibrillation, IBSwho was admitted on 5/28/2020with an acute on chronic heart failure exacerbation and with worsening renal function. She underwent RHC and was found to have markedly elevated filling pressures complicated by cardio-renal syndrome. She was also noted to have severe pulmonary hypertension.On 5/30 she became very bradycardic and experienced a PEA arrest. She received CPR for 8 minutes with ROSC. She was intubated and placed in the ICU and started on CVVHD. She was extubated on 6/3, but began to refuse bipap and was re-intubated again on 6/11. She developed MSSA PNA. Tracheostomy was placed on 6/19. She remained on CVVHD with pressor support for an extended duration and lost 40+kg. She was transitioned to intermittent HD on 6/27, but remains on midodrine and florinef for hypotension. She has successfully remained off dopamine since 11/02/18 with stable HR and BP.   I met again today with Kathleen [name] after receiving report/updates from RN. She continues in good spirits with a bright smile when I come in. She is up in chair and tolerating trach collar. Excited to learn that she has so far tolerated HD without dopamine and has maintained off dopamine! She confirms that her goals are to continue full aggressive care and to maintain full code for now. I reassured her that we will respect her wishes recognizing that this decision is right for her at this time and we also recognize that these decisions may change over time as well so we will respect her decisions along the way no matter what they may be.   We discussed plans for PEG placement and likely transition to [place] in the near future and she agrees with these plans. She has no questions/concerns or complaints. Spent some time providing emotional support.   I called daughter-in-law, [name],  with Addylynn's permission. Discussed plans for PEG placement hopefully in the next 1-2 days (should know more tomorrow). Also discussed that once PEG is placed and she continues to tolerate HD without dopamine she will be ready to transition to [facility]. [name] has some questions about the facility and looks forward to speaking with a representative from [employer]. She is pleased with Shahira's progress. All questions/concerns addressed.   Exam: Alert, awake, oriented. Good spirits. On trach collar with breathing regular and unlabored. Sitting in chair. No distress. Nods head appropriately to yes/no questions and attempts to mouth words.   Plan: - Long term feeding tube to be placed.  - Transition to [facility] anticipated.  - Hopefully we can begin some trials with PMV soon.   Arvin, NP Palliative Medicine Team Pager [phone] (Please see [email or web address] for schedule) Team Phone [phone]    Greater than 50%  of this time was spent counseling and coordinating care related to the above assessment and plan.  The above conversation was completed via telephone due to the visitor restrictions during the COVID-19 pandemic. Thorough chart review and discussion with necessary members of the care team was completed as part of assessment.

## 2018-11-07 ENCOUNTER — Inpatient Hospital Stay (HOSPITAL_COMMUNITY): Payer: Medicare HMO

## 2018-11-07 DIAGNOSIS — R1310 Dysphagia, unspecified: Secondary | ICD-10-CM

## 2018-11-07 LAB — RENAL FUNCTION PANEL
Albumin: 2.3 g/dL — ABNORMAL LOW (ref 3.5–5.0)
Anion gap: 12 (ref 5–15)
BUN: 77 mg/dL — ABNORMAL HIGH (ref 8–23)
CO2: 25 mmol/L (ref 22–32)
Calcium: 9 mg/dL (ref 8.9–10.3)
Chloride: 96 mmol/L — ABNORMAL LOW (ref 98–111)
Creatinine, Ser: 5.02 mg/dL — ABNORMAL HIGH (ref 0.44–1.00)
GFR calc Af Amer: 10 mL/min — ABNORMAL LOW (ref >60)
GFR calc non Af Amer: 8 mL/min — ABNORMAL LOW (ref >60)
Glucose, Bld: 120 mg/dL — ABNORMAL HIGH (ref 70–99)
Phosphorus: 5.6 mg/dL — ABNORMAL HIGH (ref 2.5–4.6)
Potassium: 4.7 mmol/L (ref 3.5–5.1)
Sodium: 133 mmol/L — ABNORMAL LOW (ref 135–145)

## 2018-11-07 LAB — CBC
HCT: 28 % — ABNORMAL LOW (ref 36.0–46.0)
Hemoglobin: 8 g/dL — ABNORMAL LOW (ref 12.0–15.0)
MCH: 28.7 pg (ref 26.0–34.0)
MCHC: 28.6 g/dL — ABNORMAL LOW (ref 30.0–36.0)
MCV: 100.4 fL — ABNORMAL HIGH (ref 80.0–100.0)
Platelets: 189 10*3/uL (ref 150–400)
RBC: 2.79 MIL/uL — ABNORMAL LOW (ref 3.87–5.11)
RDW: 28.4 % — ABNORMAL HIGH (ref 11.5–15.5)
WBC: 4.9 10*3/uL (ref 4.0–10.5)
nRBC: 0 % (ref 0.0–0.2)

## 2018-11-07 LAB — GLUCOSE, CAPILLARY
Glucose-Capillary: 104 mg/dL — ABNORMAL HIGH (ref 70–99)
Glucose-Capillary: 42 mg/dL — CL (ref 70–99)
Glucose-Capillary: 65 mg/dL — ABNORMAL LOW (ref 70–99)
Glucose-Capillary: 81 mg/dL (ref 70–99)
Glucose-Capillary: 93 mg/dL (ref 70–99)
Glucose-Capillary: 95 mg/dL (ref 70–99)
Glucose-Capillary: 98 mg/dL (ref 70–99)

## 2018-11-07 LAB — HEPARIN LEVEL (UNFRACTIONATED): Heparin Unfractionated: 0.43 IU/mL (ref 0.30–0.70)

## 2018-11-07 MED ORDER — LIDOCAINE-PRILOCAINE 2.5-2.5 % EX CREA
1.0000 "application " | TOPICAL_CREAM | CUTANEOUS | Status: DC | PRN
  Filled 2018-11-07: qty 5

## 2018-11-07 MED ORDER — SODIUM CHLORIDE 0.9 % IV SOLN: 100.0000 mL | INTRAVENOUS | Status: DC | PRN

## 2018-11-07 MED ORDER — ALBUMIN HUMAN 25 % IV SOLN
INTRAVENOUS | Status: AC
  Filled 2018-11-07: qty 100

## 2018-11-07 MED ORDER — HEPARIN SODIUM (PORCINE) 1000 UNIT/ML IJ SOLN
INTRAMUSCULAR | Status: AC
  Filled 2018-11-07: qty 4

## 2018-11-07 MED ORDER — LIDOCAINE HCL (PF) 1 % IJ SOLN: 5.0000 mL | INTRAMUSCULAR | Status: DC | PRN

## 2018-11-07 MED ORDER — DEXTROSE 50 % IV SOLN: 25.0000 g | Freq: Once | INTRAVENOUS | Status: DC

## 2018-11-07 MED ORDER — DEXTROSE 50 % IV SOLN
INTRAVENOUS | Status: AC
  Filled 2018-11-07: qty 50

## 2018-11-07 MED ORDER — DEXTROSE-NACL 5-0.9 % IV SOLN
INTRAVENOUS | Status: DC
  Administered 2018-11-07 – 2018-11-08 (×2): via INTRAVENOUS

## 2018-11-07 MED ORDER — HEPARIN SODIUM (PORCINE) 1000 UNIT/ML DIALYSIS: 1000.0000 [IU] | INTRAMUSCULAR | Status: DC | PRN

## 2018-11-07 MED ORDER — ALTEPLASE 2 MG IJ SOLR: 2.0000 mg | Freq: Once | INTRAMUSCULAR | Status: DC | PRN

## 2018-11-07 MED ORDER — DEXTROSE 50 % IV SOLN
25.0000 mL | Freq: Once | INTRAVENOUS | Status: AC
  Administered 2018-11-07: 25 mL via INTRAVENOUS

## 2018-11-07 MED ORDER — ALBUMIN HUMAN 25 % IV SOLN
25.0000 g | INTRAVENOUS | Status: AC | PRN
  Administered 2018-11-07 – 2018-11-12 (×3): 25 g via INTRAVENOUS
  Filled 2018-11-07 (×2): qty 100

## 2018-11-07 MED ORDER — HEPARIN SODIUM (PORCINE) 1000 UNIT/ML IJ SOLN
3.8000 mL | Freq: Once | INTRAMUSCULAR | Status: AC
  Administered 2018-11-07: 15:00:00 3800 [IU] via INTRAVENOUS

## 2018-11-07 MED ORDER — PENTAFLUOROPROP-TETRAFLUOROETH EX AERO
1.0000 "application " | INHALATION_SPRAY | CUTANEOUS | Status: DC | PRN
  Filled 2018-11-07: qty 116

## 2018-11-07 NOTE — Care Management (Signed)
Insurance auth for [employer] still pending.  Midge Minium RN, BSN, NCM-BC, ACM-RN [phone]

## 2018-11-07 NOTE — Evaluation (Signed)
Clinical/Bedside Swallow Evaluation Patient Details  Name: Kathleen [name] MRN: [id number] Date of Birth: 11-11-1950  Today's Date: 11/07/2018 Time: SLP Start Time (ACUTE ONLY): 1212 SLP Stop Time (ACUTE ONLY): 1219 SLP Time Calculation (min) (ACUTE ONLY): 7 min  Past Medical History:  Past Medical History:  Diagnosis Date  . Atrial flutter ([place])    a. permanent.  . Bradycardia   . Chronic diastolic (congestive) heart failure ([place]) 01/10/2015  . CKD (chronic kidney disease), stage IV ([place])   . Degenerative joint disease of hand   . Diabetes mellitus   . Dyslipidemia   . Fecal occult blood test positive   . GERD (gastroesophageal reflux disease)   . Headache   . Hypertension   . Inadequate material resources   . Irritable bowel syndrome   . Morbid obesity ([place])   . Obesity hypoventilation syndrome ([place])   . Post-menopausal bleeding   . Shortness of breath dyspnea    multifactorial from obesity, deconditioning, obesity hypoventilation syndrome   Past Surgical History:  Past Surgical History:  Procedure Laterality Date  . CHOLECYSTECTOMY    . EYE SURGERY     left lens implant s/p cataracts  . IR FLUORO GUIDE CV LINE RIGHT  10/03/2018  . IR US GUIDE VASC ACCESS RIGHT  10/03/2018  . OTHER SURGICAL HISTORY     right shoulder tendon repair 05/2011  . RIGHT HEART CATH N/A 09/23/2018   Procedure: RIGHT HEART CATH;  Surgeon: Jolaine Artist, MD;  Location: [facility];  Service: Cardiovascular;  Laterality: N/A;   HPI:  68 y.o. female admitted 09/22/2018 for surgery. PMH: permanent atrial flutter with baseline bradycardia, hypertension, super morbid obesity, IIDDM, dyslipidemia, obesity hypoventilation syndrome, CKD stage IV, chronic diastolic CHF, chronic edema. PEA 09/24/2018. Intubated 5/30-09/28/2018, again 6/11until trach 6/19. SLP was seeing pt in between intubations with concern for post-extubation dysphagia and instrumental testing recommended, although this was not completed  as pt had been reintubated.   Assessment / Plan / Recommendation Clinical Impression  Pt has wet-sounding coughing that occurs with trials of ice chips and even when provided with oral care. She appears to have a strong reflexive cough, but she also cannot tolerate wearing PMV for prolonged intervals and her voice is significantly dysphonic. She does not appear ready for a PO diet. She would benefit from a FEES to better assess oropharyngeal swallow. She is currently getting set up for dialysis this afternoon though and she does not show interest in POs, declining even additional ice chip trials. Depending on when she may be apropriate for a smaller trach, could consider waiting until she is able to wear PMV for longer periods. Per RN, PEG is planned for as early as tomorrow. This is likely appropriate to allow for more time to work on swallowing and PMV use. Will continue to follow for readiness.  SLP Visit Diagnosis: Dysphagia, unspecified (R13.10)    Aspiration Risk  Severe aspiration risk    Diet Recommendation NPO   Medication Administration: Via alternative means    Other  Recommendations Oral Care Recommendations: Oral care QID Other Recommendations: Have oral suction available   Follow up Recommendations LTACH      Frequency and Duration min 2x/week  2 weeks       Prognosis Prognosis for Safe Diet Advancement: Good      Swallow Study   General HPI: 68 y.o. female admitted 09/22/2018 for surgery. PMH: permanent atrial flutter with baseline bradycardia, hypertension, super morbid obesity, IIDDM, dyslipidemia,  obesity hypoventilation syndrome, CKD stage IV, chronic diastolic CHF, chronic edema. PEA 09/24/2018. Intubated 5/30-09/28/2018, again 6/11until trach 6/19. SLP was seeing pt in between intubations with concern for post-extubation dysphagia and instrumental testing recommended, although this was not completed as pt had been reintubated. Type of Study: Bedside Swallow  Evaluation Previous Swallow Assessment: BSE this admission recommending NPO, was going to have FEES but then decompensated and returned to vent Diet Prior to this Study: NPO Temperature Spikes Noted: No Respiratory Status: Trach;Trach Collar Trach Size and Type: Cuff;#8;Deflated;With PMSV in place History of Recent Intubation: Yes(see HPI) Behavior/Cognition: Alert;Cooperative Oral Cavity Assessment: Within Functional Limits Oral Care Completed by SLP: Yes Oral Cavity - Dentition: Edentulous Self-Feeding Abilities: Total assist Patient Positioning: Upright in bed Baseline Vocal Quality: (mostly aphonic) Volitional Cough: Wet    Oral/Motor/Sensory Function Overall Oral Motor/Sensory Function: Within functional limits   Ice Chips Ice chips: Impaired Presentation: Spoon Pharyngeal Phase Impairments: Wet Vocal Quality;Cough - Delayed   Thin Liquid Thin Liquid: Not tested    Nectar Thick Nectar Thick Liquid: Not tested   Honey Thick Honey Thick Liquid: Not tested   Puree Puree: Not tested   Solid     Solid: Not tested      Kathleen [name] 11/07/2018,2:08 PM  Pollyann Glen, M.A. [place] Acute Environmental education officer [phone] Office [phone]

## 2018-11-07 NOTE — Progress Notes (Signed)
ANTICOAGULATION CONSULT NOTE - Follow Up Consult  Pharmacy Consult for heparin Indication: atrial fibrillation  Allergies  Allergen Reactions  . Doxycycline     REACTION: Wheals and pruritus    Patient Measurements: Height: 5' (152.4 cm) Weight: 232 lb 2.3 oz (105.3 kg) IBW/kg (Calculated) : 45.5 Heparin Dosing Weight: 72 kg  Vital Signs: Temp: 98.7 F (37.1 C) (07/13 0754) Temp Source: Oral (07/13 0754) BP: 113/42 (07/13 0735) Pulse Rate: 54 (07/13 0700)  Labs: Recent Labs    11/05/18 0315 11/06/18 0419 11/07/18 0407  HGB 8.0* 7.9* 8.0*  HCT 27.0* 26.9* 28.0*  PLT 195 209 189  HEPARINUNFRC 0.40 0.37 0.43  CREATININE 2.98* 4.05* 5.02*    Estimated Creatinine Clearance: 11.9 mL/min (A) (by C-G formula based on SCr of 5.02 mg/dL (H)).   Medications:  Scheduled:  . aspirin  81 mg Per Tube Daily  . atorvastatin  40 mg Per Tube q1800  . chlorhexidine gluconate (MEDLINE KIT)  15 mL Mouth Rinse BID  . Chlorhexidine Gluconate Cloth  6 each Topical Daily  . Chlorhexidine Gluconate Cloth  6 each Topical Daily  . [START ON 11/09/2018] darbepoetin (ARANESP) injection - NON-DIALYSIS  200 mcg Subcutaneous Q Wed-1800  . dextrose  25 g Intravenous Once  . famotidine  20 mg Per Tube Daily  . feeding supplement (PRO-STAT SUGAR FREE 64)  30 mL Per Tube TID  . fludrocortisone  0.2 mg Oral Daily  . guaiFENesin  15 mL Per Tube Q6H  . insulin aspart  0-20 Units Subcutaneous Q4H  . insulin detemir  40 Units Subcutaneous BID  . midodrine  10 mg Oral TID WC  . polyethylene glycol  17 g Oral Daily  . sodium chloride flush  10-40 mL Intracatheter Q12H   Infusions:  . sodium chloride 250 mL (10/31/18 2358)  . DOPamine 5 mcg/kg/min (11/02/18 0600)  . feeding supplement (OSMOLITE 1.5 CAL) Stopped (11/07/18 0415)  . ferric gluconate (FERRLECIT/NULECIT) IV Stopped (11/04/18 1358)  . heparin 1,500 Units/hr (11/07/18 6468)    Assessment: 80 yoF with complicated admission on Eliquis at  home for hx of AFib, transitioned to heparin on admission which has been held since 6/29 due to bleeding and bruising at trach site. Palliative care was consulted and it was unclear whether pt would be able to wean off dopamine support, so heparin continued to be held. Pt now tolerating iHD without pressors  Heparin level continues to be therapeutic at 0.43. CBC stable. No bleeding noted.  Goal of Therapy:  Heparin level 0.3-0.7 units/ml Monitor platelets by anticoagulation protocol: Yes   Plan:  Continue heparin IV at 1500 units/hr Daily heparin level Continue monitoring H&H and platelets Plan to transition back to apixaban if/once PEG placed  [name] PharmD., BCPS Clinical Pharmacist 11/07/2018 7:59 AM   Please check [email or web address] for unit-specific pharmacist phone numbers

## 2018-11-07 NOTE — Progress Notes (Signed)
Nutrition Follow-up  DOCUMENTATION CODES:   Morbid obesity  INTERVENTION:  -Recommend addition of Vitamin C 500 mg BID, 220 mg Zinc daily, and renal MVI  Once PEG placed:  -Osmolite 1.5 @ 45 ml/hr (1080 ml) -30 ml Prostat TID  Provides:1920kcal,113grams protein,879ml free water. Meets 100% ofkcal needs and 100% of protein needs.  NUTRITION DIAGNOSIS:   Inadequate oral intake related to inability to eat as evidenced by NPO status.  Ongoing  GOAL:   Provide needs based on ASPEN/SCCM guidelines  Addressed via TF  MONITOR:   Vent status, Labs, Weight trends, I & O's, TF tolerance  REASON FOR ASSESSMENT:   Consult Enteral/tube feeding initiation and management  ASSESSMENT:   68 year old with morbid obesity, acute on chronic diastolic heart failure, pulmonary hypertension transferred to the ICU after PEA arrest.  Suspect respiratory failure due to pulmonary edema  5/30 - s/p PEA arrest, intubated and transferred to ICU,CRRTinitiated 6/2- failed SBT, pulm edema 6/3 extubated, refused BiPAP 6/10- CRRT stopped  6/11- re-intubated, CRRT re-started 6/19- failed SBT, s/p trach  6/27- transition iHD  Pt discussed during ICU rounds and with RN.   Current on ATC (48 hours without vent support). Off dopamine. Pt pulled Cortrak out this am. Plan for SLP evaluation and possible PEG placement later today if unable to pass. Pt with new stage II coccyx/vertebral column wound. Will reassess wound status at next follow up- may need to increase tube feeding. Recommend addition of vitamin/mineral supplementation to promote wound healing.   Admission weight: 134.8 kg Current weight105.3kg (downfrom107.3kg on 7/7)  I/O: +5,5945 ml since 6/29 UOP: anuric  Last HD 7/10: 3000 ml net UF  Drips: D5 in NS @ 30 ml/hr  Medications: aranesp, SS novolog, levemir, miralax Labs: Na 133 (L) Phosphorus 5.6 (okay for HD) Cr 5.02- trending up CBG 76-157  Diet Order:   Diet Order             Diet NPO time specified  Diet effective now              EDUCATION NEEDS:   No education needs have been identified at this time  Skin:  Skin Assessment: Skin Integrity Issues: Skin Integrity Issues:: Incisions, Stage II Stage II: R coccyx, vertebral column Incisions: R neck/chest Other: MASD- bilateral breast  Last BM:  7/11  Height:   Ht Readings from Last 1 Encounters:  10/05/18 5' (1.524 m)    Weight:   Wt Readings from Last 1 Encounters:  11/05/18 105.3 kg    Ideal Body Weight:  45.5 kg  BMI:  Body mass index is 45.34 kg/m.  Estimated Nutritional Needs:   Kcal:  1800-2000 kcal  Protein:  100-120 grams  Fluid:  >/= 1.7 L/day   Mariana Single RD, LDN Clinical Nutrition Pager # - [phone]

## 2018-11-07 NOTE — Progress Notes (Signed)
[facility] Progress Note    Assessment/ Plan:   1.  Acute kidney injury Superimposed on CKD IV (baseline 1.7-1.9) likely ATN secondary to shock, anuric: Transitioned to iHD on 6/27. Using right [facility] and on MWF schedule.  She is on midodrine for hypotension at 20 TID and florinef 0.2 mg daily added 7/4.  HD 7/6, 7/8, HD 7/10.  No signs of recovery at present- am not hopeful that she will be liberated from dialysis. Continue supportive care with dialysis at this present time. Next planned HD today.  With no NG tube midodrine won't be possible but will use albumin as needed for BP support during tx.   2.  Proteus +respiratory culture: s/p cefepime, ended 7/8 per PCCM.    3.  Chronic respiratory failure: Status post tracheostomy.  Currently on vent, per pulmonary, weaning as tolerated. Now TC.  4.  Acute on chronic diastolic heart failure: Appreciate the expertise of the heart failure team.  Plans to try to correlate bradycardia with hypotension to see if could/ would be a candidate for TVP/ RV micro device--> not a candidate for these interventions per notes; not felt to be a candidate for PPM per EP team.  She is off dopamine at present--> and tolerated dialysis off dopamine 7/10.  5.  Anemia due to chronic disease and acute blood loss: Continue ESA, Increase Aranesp to 200 mcg for 7/15, on a course of IV iron.    6.  Bradycardia, PEA cardiac arrest/ Afib :  As above in #4. On hep gtt as well.  7.  Dispo: remains in ICU.  Palliative care discussion noted and greatly appreciated.  Looks like a good LTAC candidate.  Subjective:    DA remains off.  Arousable and denies c/o.  Pulled NG tube overnight and IR consulted for PEG but speech will check in prior to make sure still needed.    Objective:   BP (!) 102/41   Pulse (!) 47   Temp 98.7 F (37.1 C) (Oral)   Resp 16   Ht 5' (1.524 m)   Wt 105.3 kg   SpO2 94%   BMI 45.34 kg/m   Intake/Output Summary (Last 24 hours) at  11/07/2018 1203 Last data filed at 11/07/2018 0900 Gross per 24 hour  Intake 2144.63 ml  Output 0 ml  Net 2144.63 ml   Weight change:   Physical Exam: Gen: obese, sleepy in bed, NAD HEENT: trach in place, on TC CVS: bradycardic, seems RRR Resp:dim breath sounds bilaterally Abd: obese Ext: trace LE edema ACCESS: R IJ TDC  Imaging: Dg Abd 1 View  Result Date: 11/06/2018 CLINICAL DATA:  Nasogastric tube placement EXAM: ABDOMEN - 1 VIEW COMPARISON:  Chest radiograph 11/06/2018 at 10:05 a.m. FINDINGS: The nasogastric tube tip projects over the stomach. Position is unchanged. The visualized bowel gas pattern and right lung basilar opacities are unchanged. IMPRESSION: Unchanged position of nasogastric tube with tip and side port projecting over the stomach. Electronically Signed   By: Ulyses Jarred M.D.   On: 11/06/2018 17:59   Dg Abd 1 View  Result Date: 11/06/2018 CLINICAL DATA:  Nasogastric tube placement EXAM: ABDOMEN - 1 VIEW COMPARISON:  September 30, 2018 FINDINGS: Nasogastric tube tip and side port are in the in stomach. The visualized bowel gas pattern is unremarkable without evidence of bowel obstruction or free air. There are surgical clips in right upper abdomen. There is cardiomegaly. There is airspace consolidation in the medial right base. Tracheostomy catheter tip is  4.6 cm above the carina. Central catheter tip is in the superior vena cava. IMPRESSION: Nasogastric tube tip and port in stomach. Visualized bowel gas pattern unremarkable. Stable cardiomegaly. Airspace consolidation consistent with pneumonia medial right base. Electronically Signed   By: Lowella Grip III M.D.   On: 11/06/2018 10:35    Labs: BMET Recent Labs  Lab 11/01/18 0408 11/02/18 0343 11/03/18 0503 11/04/18 0500 11/05/18 0315 11/06/18 0419 11/07/18 0407  NA 129* 128* 134* 133* 134* 133* 133*  K 3.7 3.8 3.9 4.7 4.1 4.7 4.7  CL 94* 94* 98 97* 97* 95* 96*  CO2 22 21* 24 22 27 25 25   GLUCOSE 253* 266*  154* 113* 127* 105* 120*  BUN 55* 72* 45* 71* 34* 58* 77*  CREATININE 3.48* 4.42* 3.03* 4.20* 2.98* 4.05* 5.02*  CALCIUM 8.2* 8.3* 8.1* 8.6* 8.5* 8.8* 9.0  PHOS 2.6 3.7 3.1 4.1 3.0 4.1 5.6*   CBC Recent Labs  Lab 11/04/18 0500 11/05/18 0315 11/06/18 0419 11/07/18 0407  WBC 6.4 5.5 5.5 4.9  HGB 7.9* 8.0* 7.9* 8.0*  HCT 26.8* 27.0* 26.9* 28.0*  MCV 96.8 99.3 97.8 100.4*  PLT 190 195 209 189    Medications:    . aspirin  81 mg Per Tube Daily  . atorvastatin  40 mg Per Tube q1800  . chlorhexidine gluconate (MEDLINE KIT)  15 mL Mouth Rinse BID  . Chlorhexidine Gluconate Cloth  6 each Topical Daily  . Chlorhexidine Gluconate Cloth  6 each Topical Daily  . [START ON 11/09/2018] darbepoetin (ARANESP) injection - NON-DIALYSIS  200 mcg Subcutaneous Q Wed-1800  . famotidine  20 mg Per Tube Daily  . feeding supplement (PRO-STAT SUGAR FREE 64)  30 mL Per Tube TID  . fludrocortisone  0.2 mg Oral Daily  . guaiFENesin  15 mL Per Tube Q6H  . heparin      . insulin aspart  0-20 Units Subcutaneous Q4H  . insulin detemir  40 Units Subcutaneous BID  . midodrine  10 mg Oral TID WC  . polyethylene glycol  17 g Oral Daily  . sodium chloride flush  10-40 mL Intracatheter Q12H    Jannifer Hick MD [facility] Pager [phone]

## 2018-11-07 NOTE — Progress Notes (Signed)
PROGRESS NOTE    Kathleen [name]  ALP:[id number]  DOB: 1950-11-24  DOA: 09/22/2018 PCP: Aldine Contes, MD  Brief Narrative: 68 year old female with multiple comorbidities including permanent atrial flutter with baseline bradycardia, hypertension, super morbid obesity, IIDDM, dyslipidemia, obesity hypoventilation syndrome (on home O2 with no OSA bypriorPSG but showed nocturnal hypoxemia), CKD stage IV, chronic diastolic CHF, chronic edema with prolonged hospitalization (since May 28) who initially presented with acute on chronic heart failure and AKI, admitted to cardiology service/heart failure team after failing outpatient diuretic therapy in the setting of CKD. Right heart cath on 5/29 showed markedly elevated biventricular filling pressures with normal cardiac output.  She was on cardiology service, maintained on milrinone and Lasix.  On the morning of 5/30 she went into PEA arrest, CPR performed for 8 minutes.  Intubated and transferred to ICU.  Started on CRRT 5/31.  Extubated 6/3.  Re-intubated early am 6/11 for hypercarbic respiratory failure and underwent tracheostomy on 6/19.   5/31 Milrinone stopped due to ectopy; brady episode- amio stopped 6/02 Attempt at SBT, chest x-ray with worsening pulmonary edema, CVVHD for volume removal 6/03 Extubated > remain on CRRT.  Refused nocturnal BiPAP 6/04 Refused nocturnal BiPAP 6/10 off CRRT 6/11 Intubated early am with hypercarbia, concern for RUL PNA; abx broadened; restarted on CRRT 6/16  No events overnight, weaning on high PS 6/17  failed vent wean, remains on levophed, midodrine and CRRT 6/18  Weaned for 5 hours on PSV 8/5 before requiring full support; ongoing CRRT, remains on levophed 14 mcg 6/19 - TRACH + Dr Erskine Emery 6/20 -. On IV heparin gt, levophed gtt with midodrine. Off fent gtt. Pressopr needs down 6/26 - TCT tolerated for the past 24 hours  6/27 - Stable on TCT now >48hrs, tolerated first session of iHD  6/30 hypotension /  bradycardia overnight, started on dopamine 7/2 Placed on ATC at 40% fiO2  Patient been on NG tube feeds, now on trach collar, 10 L O2, mentating well.  Requested to be transferred out of ICU and hospitalist service contacted yesterday for assuming service once out of ICU.  Subjective:  She appears comfortable. Cortrak replaced yesterday but came out again today. NPO and awaiting speech eval.   SBP fluctuating 90s to 100s. Scheduled for dialysis today.  Patient communicates by nodding and writing on a paper.PMV training to start today per PCCM note  Objective: Vitals:   11/07/18 0800 11/07/18 0900 11/07/18 1000 11/07/18 1100  BP: (!) 95/41 (!) 107/44 (!) 102/42 (!) 102/41  Pulse: (!) 49 (!) 47 (!) 51 (!) 47  Resp: 18 16 (!) 21 16  Temp:      TempSrc:      SpO2: 95% 97% 94% 94%  Weight:      Height:        Intake/Output Summary (Last 24 hours) at 11/07/2018 1130 Last data filed at 11/07/2018 0900 Gross per 24 hour  Intake 2204.63 ml  Output 0 ml  Net 2204.63 ml   Filed Weights   11/04/18 0900 11/04/18 1207 11/05/18 0500  Weight: 108.7 kg 105.7 kg 105.3 kg    Physical Examination:  General exam: Appears calm and comfortable, status post trach, + NG tube Respiratory system: Clear to auscultation. Respiratory effort normal. Cardiovascular system: S1 & S2 heard, RRR. No JVD, murmurs, rubs, gallops or clicks.  2+ pitting leg edema with Unna boots Gastrointestinal system: Abdomen is obese, soft and nontender. No organomegaly or masses felt. Normal bowel sounds heard. Central nervous system:  Alert and oriented. No focal neurological deficits. Extremities: Symmetric 5 x 5 power. Skin: Skin tear on upper back with local dressing Psychiatry: Judgement and insight appear normal. Mood & affect appropriate.     Data Reviewed: I have personally reviewed following labs and imaging studies  CBC: Recent Labs  Lab 11/02/18 0343 11/04/18 0500 11/05/18 0315 11/06/18 0419 11/07/18 0407   WBC 7.5 6.4 5.5 5.5 4.9  HGB 8.0* 7.9* 8.0* 7.9* 8.0*  HCT 27.0* 26.8* 27.0* 26.9* 28.0*  MCV 95.1 96.8 99.3 97.8 100.4*  PLT 138* 190 195 209 017   Basic Metabolic Panel: Recent Labs  Lab 11/03/18 0503 11/04/18 0500 11/05/18 0315 11/06/18 0419 11/07/18 0407  NA 134* 133* 134* 133* 133*  K 3.9 4.7 4.1 4.7 4.7  CL 98 97* 97* 95* 96*  CO2 _0 GLUCOSE 154* 113* 127* 105* 120*  BUN 45* 71* 34* 58* 77*  CREATININE 3.03* 4.20* 2.98* 4.05* 5.02*  CALCIUM 8.1* 8.6* 8.5* 8.8* 9.0  PHOS 3.1 4.1 3.0 4.1 5.6*   GFR: Estimated Creatinine Clearance: 11.9 mL/min (A) (by C-G formula based on SCr of 5.02 mg/dL (H)). Liver Function Tests: Recent Labs  Lab 11/03/18 0503 11/04/18 0500 11/05/18 0315 11/06/18 0419 11/07/18 0407  ALBUMIN 2.1* 2.2* 2.5* 2.3* 2.3*   No results for input(s): LIPASE, AMYLASE in the last 168 hours. No results for input(s): AMMONIA in the last 168 hours. Coagulation Profile: No results for input(s): INR, PROTIME in the last 168 hours. Cardiac Enzymes: No results for input(s): CKTOTAL, CKMB, CKMBINDEX, TROPONINI in the last 168 hours. BNP (last 3 results) No results for input(s): PROBNP in the last 8760 hours. HbA1C: No results for input(s): HGBA1C in the last 72 hours. CBG: Recent Labs  Lab 11/06/18 1139 11/06/18 1638 11/06/18 1958 11/06/18 2330 11/07/18 0412  GLUCAP 76 115* 102* 138* 104*   Lipid Profile: No results for input(s): CHOL, HDL, LDLCALC, TRIG, CHOLHDL, LDLDIRECT in the last 72 hours. Thyroid Function Tests: No results for input(s): TSH, T4TOTAL, FREET4, T3FREE, THYROIDAB in the last 72 hours. Anemia Panel: No results for input(s): VITAMINB12, FOLATE, FERRITIN, TIBC, IRON, RETICCTPCT in the last 72 hours. Sepsis Labs: Recent Labs  Lab 11/01/18 0408  PROCALCITON <0.10    No results found for this or any previous visit (from the past 240 hour(s)).    Radiology Studies: Dg Abd 1 View  Result Date: 11/06/2018  CLINICAL DATA:  Nasogastric tube placement EXAM: ABDOMEN - 1 VIEW COMPARISON:  Chest radiograph 11/06/2018 at 10:05 a.m. FINDINGS: The nasogastric tube tip projects over the stomach. Position is unchanged. The visualized bowel gas pattern and right lung basilar opacities are unchanged. IMPRESSION: Unchanged position of nasogastric tube with tip and side port projecting over the stomach. Electronically Signed   By: Ulyses Jarred M.D.   On: 11/06/2018 17:59   Dg Abd 1 View  Result Date: 11/06/2018 CLINICAL DATA:  Nasogastric tube placement EXAM: ABDOMEN - 1 VIEW COMPARISON:  September 30, 2018 FINDINGS: Nasogastric tube tip and side port are in the in stomach. The visualized bowel gas pattern is unremarkable without evidence of bowel obstruction or free air. There are surgical clips in right upper abdomen. There is cardiomegaly. There is airspace consolidation in the medial right base. Tracheostomy catheter tip is 4.6 cm above the carina. Central catheter tip is in the superior vena cava. IMPRESSION: Nasogastric tube tip and port in stomach. Visualized bowel gas pattern unremarkable. Stable cardiomegaly. Airspace consolidation consistent with pneumonia  medial right base. Electronically Signed   By: Lowella Grip III M.D.   On: 11/06/2018 10:35        Scheduled Meds: . aspirin  81 mg Per Tube Daily  . atorvastatin  40 mg Per Tube q1800  . chlorhexidine gluconate (MEDLINE KIT)  15 mL Mouth Rinse BID  . Chlorhexidine Gluconate Cloth  6 each Topical Daily  . Chlorhexidine Gluconate Cloth  6 each Topical Daily  . [START ON 11/09/2018] darbepoetin (ARANESP) injection - NON-DIALYSIS  200 mcg Subcutaneous Q Wed-1800  . famotidine  20 mg Per Tube Daily  . feeding supplement (PRO-STAT SUGAR FREE 64)  30 mL Per Tube TID  . fludrocortisone  0.2 mg Oral Daily  . guaiFENesin  15 mL Per Tube Q6H  . heparin      . insulin aspart  0-20 Units Subcutaneous Q4H  . insulin detemir  40 Units Subcutaneous BID  .  midodrine  10 mg Oral TID WC  . polyethylene glycol  17 g Oral Daily  . sodium chloride flush  10-40 mL Intracatheter Q12H   Continuous Infusions: . sodium chloride 250 mL (10/31/18 2358)  . albumin human    . dextrose 5 % and 0.9% NaCl 30 mL/hr at 11/07/18 0900  . DOPamine 5 mcg/kg/min (11/02/18 0600)  . feeding supplement (OSMOLITE 1.5 CAL) Stopped (11/07/18 0415)  . ferric gluconate (FERRLECIT/NULECIT) IV Stopped (11/04/18 1358)  . heparin 1,500 Units/hr (11/07/18 0900)    Assessment & Plan:    1.  Acute on chronic respiratory failure: In the setting of underlying obesity hypoventilation syndrome with acute on chronic diastolic CHF.  Now status post trach.  Trach collar during the day with 40% Fi02 and weaning off vent at night. Appreciate  PCCM f/u.  Cardiology following along.  Continue dialysis for volume overload as tolerated by blood pressure, monitor I's and O's.  Taper O2 as tolerated.PMV training   2.  Acute on chronic kidney disease stage IV: Now hemodialysis dependent and nephrology following, they are not hopeful of significant recovery or liberation from dialysis. HD session today  3.  Tracheitis/ventilator associated pneumonia: Patient had respiratory culture positive for Proteus and completed treatment with cefepime on July 8.  4.  Bradycardia/hypotension: Status post PEA arrest during the hospital course.  Not a candidate for intervention/pacemaker per EP team.  Off vasopressors for 2 days.  Now on Midodrin , reduced dose to taper and anyway unable to get today due to dislodged NG tube.  Tolerating dialysis so far and asymptomatic with heart rate 40s to 50s.  5.  Chronic atrial fibrillation: Anticoagulation was held in the setting of bleeding around trach site during the hospital course.  Now on heparin drip and tolerating well.  Can transition back to oral anticoagulant once plan for surgical interventions (like PEG) and.  6.  Chronic leg edema: On Unna boots/Ace wraps.   Continue diuresis/dialysis as tolerated.    7.  Diabetes mellitus: On Lantus/sliding scale insulin.  8.  Chronic anemia: Likely anemia of chronic disease in setting of problem #2.  Received IV iron on July 10.  Nephrology following.    9. Dysphagia: Will have Speech f/u and recommend regarding need for pursuing PEG.  IR consult placed as well. Currently NPO as BGT dislodged. Ordered mild IV hydration as patient hypoglycemic this morning and low rate okay with nephrology   Goals of care: Palliative care team following and in discussions with family.  She states her son and daughter are  her next of kin and live in [place]. Patient remains full code at this time.  Mentating well and seems to lean towards PEG if needed.  DVT prophylaxis: Heparin drip Code Status: Full code Disposition Plan:  Can go to LTAC/[facility] after PEG placed for ongoing HD there.      LOS: 46 days    Time spent: 35 minutes    Guilford Shi, MD [facility] Pager [phone]  If 7PM-7AM, please contact night-coverage [email or web address] Password [email or web address] 11/07/2018, 11:30 AM

## 2018-11-07 NOTE — Progress Notes (Addendum)
Upon initial assessment of pt this AM, noticed NG tube removed. Looking back at charting appears to have been removed by pt around 0415 11/07/2018. Morning CBG 42, giving 25 mL D50 and will do follow up CBG. Will also call to get cortrak placed today as this is the third NG tube to be out in 24 hours.   Spoke with Kamineni MD via phone and instructed to hold off on placing new cortrak d/t possible Peg placement in the near future. Speech therapy has been notified of need of eval to ensure need to PEG placement.

## 2018-11-07 NOTE — Progress Notes (Addendum)
NAME:  Kathleen [name], MRN:  [id number], DOB:  07-10-1950, LOS: 7 ADMISSION DATE:  09/22/2018, CONSULTATION DATE: 09/24/2018 REFERRING MD: Candee Furbish MD, CHIEF COMPLAINT: Cardiac arrest  Brief History   68 y/o obese woman with chronic diastolic heart failure who failed outpatient diuretics management due to chronic kidney disease.  Right heart cath on 5/29 showed markedly elevated biventricular filling pressures with normal cardiac output.  She was on cardiology service, maintained on milrinone and Lasix.  On the morning of 5/30 she went into PEA arrest, CPR performed for 8 minutes.  Intubated and transferred to ICU.  Started on CRRT 5/31.  Extubated 6/3.  Re-intubated early am 6/11 for hypercarbic respiratory failure and underwent tracheostomy on 6/19  Past Medical History   has a past medical history of Atrial flutter ([place]), Bradycardia, Chronic diastolic (congestive) heart failure ([place]) (01/10/2015), CKD (chronic kidney disease), stage IV ([place]), Degenerative joint disease of hand, Diabetes mellitus, Dyslipidemia, Fecal occult blood test positive, GERD (gastroesophageal reflux disease), Headache, Hypertension, Inadequate material resources, Irritable bowel syndrome, Morbid obesity ([place]), Obesity hypoventilation syndrome ([place]), Post-menopausal bleeding, and Shortness of breath dyspnea.  Significant Hospital Events   5/28 Admit 5/29 RHC 5/30 PEA arrest, intubated/ CRRT 5/31 Milrinone stopped due to ectopy; brady episode- amio stopped 6/02 Attempt at SBT, chest x-ray with worsening pulmonary edema, CVVHD for volume removal 6/03 Extubated > remain on CRRT.  Refused nocturnal BiPAP 6/04 Refused nocturnal BiPAP 6/10 off CRRT 6/11 Intubated early am with hypercarbia, concern for RUL PNA; abx broadened; restarted on CRRT 6/16  No events overnight, weaning on high PS 6/17  failed vent wean, remains on levophed, midodrine and CRRT 6/18  Weaned for 5 hours on PSV 8/5 before requiring full support;  ongoing CRRT, remains on levophed 14 mcg 6/19 - TRACH + Dr Erskine Emery 6/20 -. On IV heparin gt, levophed gtt with midodrine. Off fent gtt. Pressopr needs down 6/26 - TCT tolerated for the past 24 hours  6/27 - Stable on TCT now >48hrs, tolerated first session of iHD  6/30 hypotension / bradycardia overnight, started on dopamine 7/2 Placed on ATC at 40% fiO2 7/6 Palliative care consult/ iHD 7/7 remains dependent on dopamine at 7, for bradycardia/ hypotension.   Palliative consult noted - family would like to visit 7/9 for goals of care meeting. 7/08 3.5 L off w/ iHD, weaned well all day, dopamine off late evening 7/9 great day, family meeting w/son and PMT, remains full code per patient 7/11 to ATC 7/13 remains on ATC  Consults:  Cardiology PCCM  Nephrology  EP  Procedures:  Lt PICC 5/29 >> 6/30 OETT 5/30 >> 6/3; 6/11 > 6/19 - TRACH (Dr Erskine Emery) >> R IJ HD cath 5/30 >> Aline left ulnar 5/30 >> 6/2 R nare cortrak 6/5 >> 7/13  Significant Diagnostic Tests:  TTE 5/28 >>The left ventricle has normal systolic function with an ejection fraction of 60-65%. . The right ventricle has normal systolic function. The cavity was mildly enlarged.   Right heart cath 5/29 RA = 24 RV = 94/26 PA = 95/36 (53) PCW = 30 (v = 50) Fick cardiac output/index = 6.0/2.7 PVR =3.9 WU FA sat = 91% PA sat = 58%, 58% SVC 60%  Micro Data:  SARS coronavirus 2 cepheid 5/28 >> neg MRSA PCR 5/28 >> neg Trach aspirate 6/2 >> MSSA BCx2 6/2 >> negative ...................... Tracheal aspirate 6/11 >> few GNR >> few proteus and few candida tropicalis BCx2 6/11 >> negative 10/27/2018 tracheal  aspirate>> gram-positive cocci gram-negative rods>>  Antimicrobials:  Vancomycin 6/3 > 6/4 Cefazolin 6/4 >> 6/10 ...........................Marland Kitchen Vanco 6/11 >> 6/17 Cefepime 6/11 >>6/18 Cefepime 10/27/2018>> 7/6  Interim history/subjective:  Has been on ATC for last 48 hours. Tolerating well. No complaints.    Objective   Blood pressure (!) 107/44, pulse (!) 47, temperature 98.7 F (37.1 C), temperature source Oral, resp. rate 16, height 5' (1.524 m), weight 105.3 kg, SpO2 97 %.    FiO2 (%):  [40 %] 40 %   Intake/Output Summary (Last 24 hours) at 11/07/2018 1043 Last data filed at 11/07/2018 0900 Gross per 24 hour  Intake 2204.63 ml  Output 0 ml  Net 2204.63 ml   Filed Weights   11/04/18 0900 11/04/18 1207 11/05/18 0500  Weight: 108.7 kg 105.7 kg 105.3 kg   General:  Obese elderly female in NAD Neuro:  Alert, oriented, non-focal HEENT:  [place]/AT, No JVD noted, PERRL Cardiovascular:  RRR, no MRG Lungs:  Clear. ATC 40%. unlabored Abdomen:  Soft, non-distended Musculoskeletal:  No acute deformity Skin:  Intact, MMM  Assessment & Plan:    Persistent resp failure now s/p tracheostomy - ATC last 48 hours.  - trach care  - start LTAC placement - SLP following. Start PMV trials.    HD dependent renal failure.   - Continue HD per nephrology.   Proteus colonization vs. Bronchitis - s/p 5 day course cefepime completed 7/6. -  Monitor off abx.  Persistent shock after PEA arrest- resolved  Bradycardia - off dopamine 7/8,  Intermittent Afib/ hx of Aflutter Acute on chronic diastolic HF - per primary - volume per iHD - not a candidate for any EP procedures   Nutrition - Continue TF's, but NGT came out today. SLP eval for swallow. If fails will need PEG  Anemia - stable, restarting heparin today, monitor CBC/ bleeding  Goals of care. - PMT involved. FULL code.   Best practice:  Diet: Continue tube feeds Pain/Anxiety/Delirium protocol (if indicated): NA VAP protocol (if indicated): In place  DVT prophylaxis: restarting heparin gtt 7/10 GI prophylaxis: Pepcid Glucose control: SSI Mobility: PT/ OT as tolerated Code Status: Full Disposition: ICU    Georgann Housekeeper, AGACNP-BC [place] Pager [phone] or [phone]  11/07/2018 10:50 AM  Attending  Note:  68 year old female with an extensive PMH that was trached post cardiac arrest.  Tolerating TC overnight.  On exam, coarse BS diffusely.  I reviewed CXR myself, trach is in a good position.  Discussed with PCCM-NP.    Acute respiratory failure:  - Maintain off vent as tolerating  - Monitor for airway clearance  Trach status:  - Maintain current size and type  - No downsizing at this point  Hypoxemia:  - Titrate O2 for sat of 88-92%  Dysphagia:  - SLP  - PEG if fails  PCCM will continue to follow for vent/trach management  Patient seen and examined, agree with above note.  I dictated the care and orders written for this patient under my direction.  Rush Farmer, [place]

## 2018-11-07 NOTE — Progress Notes (Signed)
Patient ID: Kathleen [name], female   DOB: 1950-10-20, 68 y.o.   MRN: [id number] Request received for possible gastrostomy tube placement in pt. CT abdomen today revealed:  Gastric anatomy NOT amenable to percutaneous gastrostomy tube placement secondary to interposition of the transverse colon and left lobe of the liver. If gastrostomy tube placement is still desired, surgical consultation is advised

## 2018-11-07 NOTE — Progress Notes (Signed)
  Speech Language Pathology Treatment: Kathleen [name] Speaking valve  Patient Details Name: Kathleen [name] MRN: [id number] DOB: Dec 10, 1950 Today's Date: 11/07/2018 Time: 1204-1212 SLP Time Calculation (min) (ACUTE ONLY): 8 min  Assessment / Plan / Recommendation Clinical Impression  Pt has mildly improved tolerance of PMV compared to prior trials, which may be related to decrease in amount of secretions. Today she expectorated only a small amount tracheally after cuff deflation. She then wore the PMV for almost 10 minutes before she needed to have it removed. Although she does show some upper airway patency, she is primarily aphonic and both voicing and airflow appear to be somewhat effortful.  Recommend continued use with SLP only. Anticipate that a smaller trach, when able to downsize, would facilitate airflow and PMV use.    HPI HPI: 68 y.o. female admitted 09/22/2018 for surgery. PMH: permanent atrial flutter with baseline bradycardia, hypertension, super morbid obesity, IIDDM, dyslipidemia, obesity hypoventilation syndrome, CKD stage IV, chronic diastolic CHF, chronic edema. PEA 09/24/2018. Intubated 5/30-09/28/2018, again 6/11until trach 6/19. SLP was seeing pt in between intubations with concern for post-extubation dysphagia and instrumental testing recommended, although this was not completed as pt had been reintubated.      SLP Plan  Continue with current plan of care       Recommendations         Patient may use Passy-Muir Speech Valve: with SLP only         Oral Care Recommendations: Oral care QID Follow up Recommendations: LTACH SLP Visit Diagnosis: Aphonia (R49.1) Plan: Continue with current plan of care       GO                Kathleen [name] 11/07/2018, 1:23 PM  Kathleen [name], M.A. Kathleen [name] Acute Environmental education officer [phone] Office [phone]

## 2018-11-07 NOTE — Progress Notes (Signed)
Noon CBG 65. Will give 25 mL D50 and follow up CBG in 15 min

## 2018-11-07 NOTE — Progress Notes (Signed)
PT Cancellation Note  Patient Details Name: Kathleen [name] MRN: [id number] DOB: [birth year]   Cancelled Treatment:    Reason Eval/Treat Not Completed: Patient at procedure or test/unavailable(Pt currently having HD.  Will follow up tomorrow. )   Godfrey Pick Guilford Shannahan 11/07/2018, 1:29 PM  Dionisios Ricci,PT Acute Rehabilitation Services Pager:  [phone]  Office:  [phone]

## 2018-11-08 DIAGNOSIS — Z930 Tracheostomy status: Secondary | ICD-10-CM

## 2018-11-08 DIAGNOSIS — R1310 Dysphagia, unspecified: Secondary | ICD-10-CM

## 2018-11-08 LAB — CBC
HCT: 30.3 % — ABNORMAL LOW (ref 36.0–46.0)
Hemoglobin: 8.8 g/dL — ABNORMAL LOW (ref 12.0–15.0)
MCH: 28.6 pg (ref 26.0–34.0)
MCHC: 29 g/dL — ABNORMAL LOW (ref 30.0–36.0)
MCV: 98.4 fL (ref 80.0–100.0)
Platelets: 186 10*3/uL (ref 150–400)
RBC: 3.08 MIL/uL — ABNORMAL LOW (ref 3.87–5.11)
RDW: 28 % — ABNORMAL HIGH (ref 11.5–15.5)
WBC: 4.8 10*3/uL (ref 4.0–10.5)
nRBC: 0 % (ref 0.0–0.2)

## 2018-11-08 LAB — RENAL FUNCTION PANEL
Albumin: 2.8 g/dL — ABNORMAL LOW (ref 3.5–5.0)
Anion gap: 13 (ref 5–15)
BUN: 43 mg/dL — ABNORMAL HIGH (ref 8–23)
CO2: 24 mmol/L (ref 22–32)
Calcium: 8.8 mg/dL — ABNORMAL LOW (ref 8.9–10.3)
Chloride: 96 mmol/L — ABNORMAL LOW (ref 98–111)
Creatinine, Ser: 4.01 mg/dL — ABNORMAL HIGH (ref 0.44–1.00)
GFR calc Af Amer: 13 mL/min — ABNORMAL LOW (ref >60)
GFR calc non Af Amer: 11 mL/min — ABNORMAL LOW (ref >60)
Glucose, Bld: 107 mg/dL — ABNORMAL HIGH (ref 70–99)
Phosphorus: 4.4 mg/dL (ref 2.5–4.6)
Potassium: 4.4 mmol/L (ref 3.5–5.1)
Sodium: 133 mmol/L — ABNORMAL LOW (ref 135–145)

## 2018-11-08 LAB — GLUCOSE, CAPILLARY
Glucose-Capillary: 107 mg/dL — ABNORMAL HIGH (ref 70–99)
Glucose-Capillary: 113 mg/dL — ABNORMAL HIGH (ref 70–99)
Glucose-Capillary: 73 mg/dL (ref 70–99)
Glucose-Capillary: 82 mg/dL (ref 70–99)
Glucose-Capillary: 83 mg/dL (ref 70–99)
Glucose-Capillary: 96 mg/dL (ref 70–99)

## 2018-11-08 LAB — HEPARIN LEVEL (UNFRACTIONATED): Heparin Unfractionated: 0.41 IU/mL (ref 0.30–0.70)

## 2018-11-08 MED ORDER — FAMOTIDINE IN NACL 20-0.9 MG/50ML-% IV SOLN
20.0000 mg | INTRAVENOUS | Status: DC
  Administered 2018-11-08: 20 mg via INTRAVENOUS
  Filled 2018-11-08: qty 50

## 2018-11-08 MED ORDER — ASPIRIN 300 MG RE SUPP
150.0000 mg | Freq: Every day | RECTAL | Status: DC
  Administered 2018-11-08: 150 mg via RECTAL
  Filled 2018-11-08: qty 1

## 2018-11-08 MED ORDER — INSULIN DETEMIR 100 UNIT/ML ~~LOC~~ SOLN
20.0000 [IU] | Freq: Every day | SUBCUTANEOUS | Status: DC
  Administered 2018-11-08: 20 [IU] via SUBCUTANEOUS
  Filled 2018-11-08 (×2): qty 0.2

## 2018-11-08 NOTE — Progress Notes (Signed)
ANTICOAGULATION CONSULT NOTE - Follow Up Consult  Pharmacy Consult for heparin Indication: atrial fibrillation  Allergies  Allergen Reactions  . Doxycycline     REACTION: Wheals and pruritus    Patient Measurements: Height: 5' (152.4 cm) Weight: 225 lb 12 oz (102.4 kg) IBW/kg (Calculated) : 45.5 Heparin Dosing Weight: 72 kg  Vital Signs: Temp: 98.5 F (36.9 C) (07/14 0401) Temp Source: Oral (07/14 0401) BP: 82/38 (07/14 0630) Pulse Rate: 57 (07/14 0630)  Labs: Recent Labs    11/06/18 0419 11/07/18 0407 11/08/18 0522  HGB 7.9* 8.0* 8.8*  HCT 26.9* 28.0* 30.3*  PLT 209 189 186  HEPARINUNFRC 0.37 0.43 0.41  CREATININE 4.05* 5.02* 4.01*    Estimated Creatinine Clearance: 14.7 mL/min (A) (by C-G formula based on SCr of 4.01 mg/dL (H)).   Medications:  Scheduled:  . aspirin  81 mg Per Tube Daily  . atorvastatin  40 mg Per Tube q1800  . chlorhexidine gluconate (MEDLINE KIT)  15 mL Mouth Rinse BID  . Chlorhexidine Gluconate Cloth  6 each Topical Daily  . Chlorhexidine Gluconate Cloth  6 each Topical Daily  . [START ON 11/09/2018] darbepoetin (ARANESP) injection - NON-DIALYSIS  200 mcg Subcutaneous Q Wed-1800  . famotidine  20 mg Per Tube Daily  . feeding supplement (PRO-STAT SUGAR FREE 64)  30 mL Per Tube TID  . fludrocortisone  0.2 mg Oral Daily  . guaiFENesin  15 mL Per Tube Q6H  . insulin aspart  0-20 Units Subcutaneous Q4H  . insulin detemir  40 Units Subcutaneous BID  . midodrine  10 mg Oral TID WC  . polyethylene glycol  17 g Oral Daily  . sodium chloride flush  10-40 mL Intracatheter Q12H   Infusions:  . sodium chloride 250 mL (10/31/18 2358)  . sodium chloride    . sodium chloride    . albumin human 25 g (11/07/18 1315)  . dextrose 5 % and 0.9% NaCl 30 mL/hr at 11/08/18 0622  . DOPamine 5 mcg/kg/min (11/07/18 2352)  . feeding supplement (OSMOLITE 1.5 CAL) Stopped (11/07/18 0415)  . ferric gluconate (FERRLECIT/NULECIT) IV 125 mg (11/07/18 1410)  .  heparin 1,500 Units/hr (11/08/18 0600)    Assessment: Kathleen [name] with complicated admission on Eliquis at home for hx of AFib, transitioned to heparin on admission which has been held since 6/29 due to bleeding and bruising at trach site. Palliative care was consulted and it was unclear whether pt would be able to wean off dopamine support, so heparin continued to be held. Pt now tolerating iHD without pressors  Heparin level continues to be therapeutic at 0.4. CBC stable. No bleeding noted.  Goal of Therapy:  Heparin level 0.3-0.7 units/ml Monitor platelets by anticoagulation protocol: Yes   Plan:  Continue heparin IV at 1500 units/hr Daily heparin level and cbc Plan to transition back to apixaban if/once PEG placed  [name] PharmD., BCPS Clinical Pharmacist 11/08/2018 7:58 AM  Please check [email or web address] for unit-specific pharmacist phone numbers

## 2018-11-08 NOTE — Progress Notes (Signed)
NAME:  Kathleen [name], MRN:  [id number], DOB:  20-May-1950, LOS: 38 ADMISSION DATE:  09/22/2018, CONSULTATION DATE: 09/24/2018 REFERRING MD: Candee Furbish MD, CHIEF COMPLAINT: Cardiac arrest  Brief History   68 y/o obese woman with chronic diastolic heart failure who failed outpatient diuretics management due to chronic kidney disease.  Right heart cath on 5/29 showed markedly elevated biventricular filling pressures with normal cardiac output.  She was on cardiology service, maintained on milrinone and Lasix.  On the morning of 5/30 she went into PEA arrest, CPR performed for 8 minutes.  Intubated and transferred to ICU.  Started on CRRT 5/31.  Extubated 6/3.  Re-intubated early am 6/11 for hypercarbic respiratory failure and underwent tracheostomy on 6/19  Past Medical History   has a past medical history of Atrial flutter ([place]), Bradycardia, Chronic diastolic (congestive) heart failure ([place]) (01/10/2015), CKD (chronic kidney disease), stage IV ([place]), Degenerative joint disease of hand, Diabetes mellitus, Dyslipidemia, Fecal occult blood test positive, GERD (gastroesophageal reflux disease), Headache, Hypertension, Inadequate material resources, Irritable bowel syndrome, Morbid obesity ([place]), Obesity hypoventilation syndrome ([place]), Post-menopausal bleeding, and Shortness of breath dyspnea.  Significant Hospital Events   5/28 Admit 5/29 RHC 5/30 PEA arrest, intubated/ CRRT 5/31 Milrinone stopped due to ectopy; brady episode- amio stopped 6/02 Attempt at SBT, chest x-ray with worsening pulmonary edema, CVVHD for volume removal 6/03 Extubated > remain on CRRT.  Refused nocturnal BiPAP 6/04 Refused nocturnal BiPAP 6/10 off CRRT 6/11 Intubated early am with hypercarbia, concern for RUL PNA; abx broadened; restarted on CRRT 6/16  No events overnight, weaning on high PS 6/17  failed vent wean, remains on levophed, midodrine and CRRT 6/18  Weaned for 5 hours on PSV 8/5 before requiring full support;  ongoing CRRT, remains on levophed 14 mcg 6/19 - TRACH + Dr Erskine Emery 6/20 -. On IV heparin gt, levophed gtt with midodrine. Off fent gtt. Pressopr needs down 6/26 - TCT tolerated for the past 24 hours  6/27 - Stable on TCT now >48hrs, tolerated first session of iHD  6/30 hypotension / bradycardia overnight, started on dopamine 7/2 Placed on ATC at 40% fiO2 7/6 Palliative care consult/ iHD 7/7 remains dependent on dopamine at 7, for bradycardia/ hypotension.   Palliative consult noted - family would like to visit 7/9 for goals of care meeting. 7/08 3.5 L off w/ iHD, weaned well all day, dopamine off late evening 7/9 great day, family meeting w/son and PMT, remains full code per patient 7/11 to ATC 7/13 remains on ATC  Consults:  Cardiology PCCM  Nephrology  EP  Procedures:  Lt PICC 5/29 >> 6/30 OETT 5/30 >> 6/3; 6/11 > 6/19 - TRACH (Dr Erskine Emery) >> R IJ HD cath 5/30 >> Aline left ulnar 5/30 >> 6/2 R nare cortrak 6/5 >> 7/13  Significant Diagnostic Tests:  TTE 5/28 >>The left ventricle has normal systolic function with an ejection fraction of 60-65%. . The right ventricle has normal systolic function. The cavity was mildly enlarged.   Right heart cath 5/29 RA = 24 RV = 94/26 PA = 95/36 (53) PCW = 30 (v = 50) Fick cardiac output/index = 6.0/2.7 PVR =3.9 WU FA sat = 91% PA sat = 58%, 58% SVC 60%  Micro Data:  SARS coronavirus 2 cepheid 5/28 >> neg MRSA PCR 5/28 >> neg Trach aspirate 6/2 >> MSSA BCx2 6/2 >> negative ...................... Tracheal aspirate 6/11 >> few GNR >> few proteus and few candida tropicalis BCx2 6/11 >> negative 10/27/2018 tracheal  aspirate>> gram-positive cocci gram-negative rods>>  Antimicrobials:  Vancomycin 6/3 > 6/4 Cefazolin 6/4 >> 6/10 ...........................Marland Kitchen Vanco 6/11 >> 6/17 Cefepime 6/11 >>6/18 Cefepime 10/27/2018>> 7/6  Interim history/subjective:  Required increased FiO2 overnight to 70% but remained off vent  Objective    Blood pressure (!) 117/52, pulse (!) 59, temperature (!) 97.4 F (36.3 C), temperature source Axillary, resp. rate 16, height 5' (1.524 m), weight 102.4 kg, SpO2 93 %.    FiO2 (%):  [40 %-70 %] 60 %   Intake/Output Summary (Last 24 hours) at 11/08/2018 4599 Last data filed at 11/08/2018 0600 Gross per 24 hour  Intake 929.84 ml  Output 2800 ml  Net -1870.16 ml   Filed Weights   11/07/18 1200 11/07/18 1510 11/08/18 0624  Weight: 105.3 kg 102.5 kg 102.4 kg   General:  Chronically ill appearing female, NAD Neuro:  Alert and interactive, moving all ext to command HEENT:  [place]/AT, PERRL, EOM-I and MMM, cuffed trach in place Cardiovascular:  RRR, Nl S1/S2 and -M/R/G Lungs:  CTA bilaterally on 60% FiO2 now Abdomen:  Soft, NT, ND and +BS Musculoskeletal:  No acute deformity Skin:  Intact, MMM  I reviewed CXR myself, trach is in a good position, cuffed 6  Assessment & Plan:   Persistent resp failure now s/p tracheostomy - Maintain on TC as tolerated - Trach care per protocol - May remove vent from room - Hold off PMV - May consider downsize once FiO2 improves, increased from 40 to 70 overnight now down to 60 that I suspect is due to increased secretions   HD dependent renal failure.   - Continue HD per nephrology.   Proteus colonization vs. Bronchitis - s/p 5 day course cefepime completed 7/6. -  Monitor off abx.  Persistent shock after PEA arrest- resolved  Bradycardia - off dopamine 7/8,  Intermittent Afib/ hx of Aflutter Acute on chronic diastolic HF - Per primary - Volume per iHD - Not a candidate for any EP procedures   Nutrition - Continue TF's, but NGT came out today. SLP eval for swallow. If fails will need PEG  Anemia - stable, restarting heparin today, monitor CBC/ bleeding  Goals of care. - PMT involved. FULL code.   Dysphagia: failed swallow evaluation on 7/13.  IR unable to do G-tube due to anatomical concerns.  CCS consulted.  Once O2 needs and secretions  improve then will consider downsize to see if we can assist with dysphagia.  Discussed with TRH-MD  Best practice:  Diet: Continue tube feeds Pain/Anxiety/Delirium protocol (if indicated): NA VAP protocol (if indicated): In place  DVT prophylaxis: restarting heparin gtt 7/10 GI prophylaxis: Pepcid Glucose control: SSI Mobility: PT/ OT as tolerated Code Status: Full Disposition: ICU   Rush Farmer, M.D. [facility] Pulmonary/Critical Care Medicine. Pager: [phone]. After hours pager: [phone].

## 2018-11-08 NOTE — Progress Notes (Signed)
Chart reviewed for LOS; B Avneet Ashmore RN,MHA,BSN Advanced Care Supervisor [phone] 

## 2018-11-08 NOTE — Progress Notes (Signed)
Physical Therapy Treatment Patient Details Name: Kathleen [name] MRN: [id number] DOB: 04/23/1951 Today's Date: 11/08/2018    History of Present Illness Kathleen [name] is a 68 y.o. female with history of permanent AF, hypertension, super morbid obesity, DM2, dyslipidemia, obesity hypoventilation syndrome (on home O2 with no OSA by prior PSG but showed nocturnal hypoxemia), CKD stage IV (baseline creatinine about 1.9), chronic diastolic CHF.  Admit for fluid overload. Also with pulmonary HTN.  PEA arrest 5/30, transfer to the ICU, extubater 6/3.  Intubated 10/06/18.  Trach on 10/14/18.      PT Comments    Pt admitted with above diagnosis. Pt currently with functional limitations due to balance and endurance deficits. Pt was able to stand to [facility] x 5 with min guard assist to stand.   Stood a total of 45 seconds at a time.  Pt fatigues quickly and once she gets tired, harder to motivate.  Overall improved.  Pt will benefit from skilled PT to increase their independence and safety with mobility to allow discharge to the venue listed below.     Follow Up Recommendations  LTACH;Supervision/Assistance - 24 hour     Equipment Recommendations  None recommended by PT;Other (comment)(TBA)    Recommendations for Other Services Rehab consult     Precautions / Restrictions Precautions Precautions: Fall Restrictions Weight Bearing Restrictions: No    Mobility  Bed Mobility               General bed mobility comments: pt in chair on arrival  Transfers Overall transfer level: Needs assistance   Transfers: Sit to/from Stand Sit to Stand: Min guard;+2 safety/equipment         General transfer comment: with use of stedy, pt able to sit to stand  5 x with min guard assist with pt standing without assist.  Pt worked on weight shifting in [place] but would not pick her feet off of the [place] even with cuing.  Also needed assist and cues for sitting as she sits abruptly and without warning even  though cued not to do so.  Pt fatigues quickly.  However overall pt much improved today.    Ambulation/Gait                 Stairs             Wheelchair Mobility    Modified Rankin (Stroke Patients Only)       Balance Overall balance assessment: Needs assistance         Standing balance support: Bilateral upper extremity supported Standing balance-Leahy Scale: Poor Standing balance comment: worked on sit<>stand from recliner to stedy, pt able to stand with B UE support and stood up to 45 seconds 5 x with  weight shifting as well.                            Cognition Arousal/Alertness: Awake/alert Behavior During Therapy: WFL for tasks assessed/performed Overall Cognitive Status: Difficult to assess                                 General Comments: pt following commands with increased time, some slow processing - continue assessment       Exercises General Exercises - Upper Extremity Shoulder Flexion: AROM;Both;10 reps;Supine Elbow Flexion: AROM;Both;10 reps;Seated General Exercises - Lower Extremity Ankle Circles/Pumps: AROM;Both;10 reps;Supine Quad Sets: AROM;Both;10 reps;Supine Long Arc Quad:  AROM;Both;10 reps;Seated    General Comments General comments (skin integrity, edema, etc.): Trach collar 60% FiO2 wtih VSS      Pertinent Vitals/Pain Pain Assessment: No/denies pain    Home Living                      Prior Function            PT Goals (current goals can now be found in the care plan section) Acute Rehab PT Goals Patient Stated Goal: to go home Progress towards PT goals: Progressing toward goals    Frequency    Min 3X/week      PT Plan Current plan remains appropriate    Co-evaluation PT/OT/SLP Co-Evaluation/Treatment: Yes            AM-PAC PT "6 Clicks" Mobility   Outcome Measure  Help needed turning from your back to your side while in a flat bed without using bedrails?: A  Lot Help needed moving from lying on your back to sitting on the side of a flat bed without using bedrails?: A Lot Help needed moving to and from a bed to a chair (including a wheelchair)?: A Lot Help needed standing up from a chair using your arms (e.g., wheelchair or bedside chair)?: A Little Help needed to walk in hospital room?: Total Help needed climbing 3-5 steps with a railing? : Total 6 Click Score: 11    End of Session Equipment Utilized During Treatment: Gait belt;Oxygen(trach) Activity Tolerance: Patient limited by fatigue Patient left: in chair;with call bell/phone within reach;with chair alarm set Nurse Communication: Mobility status;Need for lift equipment(use of Stedy) PT Visit Diagnosis: Other abnormalities of gait and mobility (R26.89);Muscle weakness (generalized) (M62.81)     Time: 9211-9417 PT Time Calculation (min) (ACUTE ONLY): 23 min  Charges:  $Therapeutic Exercise: 8-22 mins $Therapeutic Activity: 8-22 mins                     [place] Pager:  [phone]  Office:  [place] 11/08/2018, 10:26 AM

## 2018-11-08 NOTE — Consult Note (Signed)
[facility] Surgery Consult/Admission Note  Kathleen [name] [birth year]  [id number].    Requesting Provider: Dr. Cathlean Sauer Chief Complaint/Reason for Consult: G tube  HPI:   Pt is a 68 yo old female with a hx of atrial fibrillation, hypertension, morbid obesity, type 2 diabetes mellitus, dyslipidemia, obesity hypoventilation, chronic kidney disease stage V, and diastolic heart failure we have been asked to see for laparoscopic vs open G tube.   Brief hospital course: admitted on 05/28 for worsening SOB, orthopnea, and a worsening O2 requirement 2/2 CHF and CKD. She had a prolonged hospitalization. On 09/24/18, she suffered from a cardiac arrest, pulseless electrical activity and recovered spontaneous circulation after 8 minutes of CPR. She was intubated. She had worsening kidney function required CRRT 09/25/18.  She was extubated 09/28/18, and reintubated 10/06/18 for hypercapnic respiratory failure.  She remained ventilator dependent respiratory failure and on June 19 underwent tracheostomy and is currently on high flow FiO2 via trach collar. She failed swallow eval yesterday and she pulled her NGT. Speech is suggesting a FEES but she may need trach downsized before she will have much improvement in swallowing. CCM is waiting for FiO2 to improve before downsizing.   Pt has no complaints today. She denies abdominal pain. She has a hx of open cholecystectomy. She does not remember how long ago but she states at least 10 years ago. No hx of other abdominal surgeries. Pt is on a heparin drip.     ROS:  Review of Systems  Constitutional: Negative for chills, diaphoresis and fever.  HENT: Negative for sore throat.   Respiratory: Positive for cough and sputum production. Negative for shortness of breath.   Cardiovascular: Negative for chest pain.  Gastrointestinal: Negative for abdominal pain, blood in stool, constipation, diarrhea, nausea and vomiting.  Genitourinary: Negative for dysuria.   Skin: Negative for rash.  Neurological: Negative for dizziness and loss of consciousness.  All other systems reviewed and are negative.    Family History  Problem Relation Age of Onset  . Diabetes Mother   . Obesity Mother   . Stroke Father   . Diabetes Sister   . Colon cancer Neg Hx     Past Medical History:  Diagnosis Date  . Atrial flutter ([place])    a. permanent.  . Bradycardia   . Chronic diastolic (congestive) heart failure ([place]) 01/10/2015  . CKD (chronic kidney disease), stage IV ([place])   . Degenerative joint disease of hand   . Diabetes mellitus   . Dyslipidemia   . Fecal occult blood test positive   . GERD (gastroesophageal reflux disease)   . Headache   . Hypertension   . Inadequate material resources   . Irritable bowel syndrome   . Morbid obesity ([place])   . Obesity hypoventilation syndrome ([place])   . Post-menopausal bleeding   . Shortness of breath dyspnea    multifactorial from obesity, deconditioning, obesity hypoventilation syndrome    Past Surgical History:  Procedure Laterality Date  . CHOLECYSTECTOMY    . EYE SURGERY     left lens implant s/p cataracts  . IR FLUORO GUIDE CV LINE RIGHT  10/03/2018  . IR US GUIDE VASC ACCESS RIGHT  10/03/2018  . OTHER SURGICAL HISTORY     right shoulder tendon repair 05/2011  . RIGHT HEART CATH N/A 09/23/2018   Procedure: RIGHT HEART CATH;  Surgeon: Jolaine Artist, MD;  Location: [place];  Service: Cardiovascular;  Laterality: N/A;    Social History:  reports  that she has never smoked. She has never used smokeless tobacco. She reports that she does not drink alcohol or use drugs.  Allergies:  Allergies  Allergen Reactions  . Doxycycline     REACTION: Wheals and pruritus    Medications Prior to Admission  Medication Sig Dispense Refill  . apixaban (ELIQUIS) 5 MG TABS tablet Take 1 tablet (5 mg total) by mouth 2 (two) times daily. 180 tablet 2  . atorvastatin (LIPITOR) 40 MG tablet Take 1 tablet (40  mg total) by mouth daily. 90 tablet 3  . gabapentin (NEURONTIN) 300 MG capsule TAKE 1 CAPSULE EVERY MORNING, TAKE 1 CAPSULE IN THE AFTERNOON, AND TAKE 2 CAPSULES EVERY NIGHT (Patient taking differently: Take 300 mg by mouth. TAKE 300 mg CAPSULE EVERY MORNING, TAKE 300 mg CAPSULE IN THE AFTERNOON, AND TAKE 600 mg  EVERY NIGHT) 360 capsule 3  . insulin detemir (LEVEMIR) 100 UNIT/ML injection Inject 65 units total into the skin once daily. 30 mL 5  . liraglutide (VICTOZA) 18 MG/3ML SOPN Inject 0.3 mLs (1.8 mg total) into the skin daily. 9 pen 3  . metolazone (ZAROXOLYN) 5 MG tablet Take 5 mg by mouth daily.    Marland Kitchen omeprazole (PRILOSEC) 20 MG capsule Take 20 mg by mouth daily.    Marland Kitchen POTASSIUM CHLORIDE CRYS ER PO Take 20 mEq by mouth daily. For 10 days    . torsemide (DEMADEX) 20 MG tablet Take 20 mg by mouth 2 (two) times daily.    Marland Kitchen ACCU-CHEK AVIVA PLUS test strip TEST BLOOD SUGAR THREE TIMES DAILY (Patient taking differently: 1 each by Other route 3 (three) times daily. ) 100 each 2  . Accu-Chek FastClix Lancets MISC TEST BLOOD SUGAR THREE TIMES DAILY 306 each 1  . Accu-Chek Softclix Lancets lancets TEST BLOOD SUGAR THREE TIMES DAILY (Patient taking differently: 1 each 3 (three) times daily. ) 300 each 1  . Blood Glucose Monitoring Suppl (ACCU-CHEK AVIVA PLUS) w/Device KIT TEST BLOOD SUGAR THREE TIMES DAILY 1 kit 0  . Insulin Pen Needle (B-D UF III MINI PEN NEEDLES) 31G X 5 MM MISC USE ONE TIME DAILY TO INJECT VICTOZA. DX:E11.9 100 each 4  . Insulin Syringe-Needle U-100 (BD VEO INSULIN SYRINGE U/F) 31G X 15/64" 1 ML MISC USE TO INJECT INSULIN ONE TIME A DAY 100 each 5  . metFORMIN (GLUCOPHAGE) 500 MG tablet Take 1 tablet (500 mg total) by mouth 2 (two) times daily with a meal. 180 tablet 1  . Multiple Vitamins-Minerals (HEALTHY EYES PO) Take 1 tablet by mouth daily. Reported on 05/17/2015    . NON FORMULARY Place 2 L into the nose daily. Wears with exertion and qhs      Blood pressure (!) 117/52, pulse  (!) 56, temperature (!) 97.4 F (36.3 C), temperature source Axillary, resp. rate 20, height 5' (1.524 m), weight 102.4 kg, SpO2 95 %.  Physical Exam Vitals signs and nursing note reviewed.  Constitutional:      General: She is not in acute distress.    Appearance: Normal appearance. She is morbidly obese. She is not ill-appearing, toxic-appearing or diaphoretic.  HENT:     Head: Normocephalic and atraumatic.     Nose: Nose normal.     Mouth/Throat:     Lips: Pink.     Mouth: Mucous membranes are moist.     Pharynx: Oropharynx is clear.  Eyes:     General: No scleral icterus.       Right eye: No discharge.  Left eye: No discharge.     Conjunctiva/sclera: Conjunctivae normal.     Pupils: Pupils are equal, round, and reactive to light.  Neck:     Musculoskeletal: Normal range of motion and neck supple.     Comments: Trach in place Cardiovascular:     Rate and Rhythm: Normal rate. Rhythm irregular.     Pulses:          Radial pulses are 2+ on the right side and 2+ on the left side.     Heart sounds: Normal heart sounds. No murmur.  Pulmonary:     Effort: Pulmonary effort is normal. No respiratory distress.     Breath sounds: Rhonchi (heard throughout all fields) present. No wheezing or rales.     Comments: Trach collar, copious secretions Abdominal:     General: Bowel sounds are normal. There is no distension.     Palpations: Abdomen is soft. Abdomen is not rigid.     Tenderness: There is no abdominal tenderness. There is no guarding.       Comments: Previous well healed RUQ scar from open cholecystectomy   Musculoskeletal: Normal range of motion.        General: No tenderness or deformity.  Skin:    General: Skin is warm and dry.     Findings: No rash.  Neurological:     Mental Status: She is alert and oriented to person, place, and time.  Psychiatric:        Mood and Affect: Mood normal.        Behavior: Behavior normal.     Results for orders placed or  performed during the hospital encounter of 09/22/18 (from the past 48 hour(s))  Glucose, capillary     Status: None   Collection Time: 11/06/18 11:39 AM  Result Value Ref Range   Glucose-Capillary 76 70 - 99 mg/dL  Glucose, capillary     Status: Abnormal   Collection Time: 11/06/18  4:38 PM  Result Value Ref Range   Glucose-Capillary 115 (H) 70 - 99 mg/dL  Glucose, capillary     Status: Abnormal   Collection Time: 11/06/18  7:58 PM  Result Value Ref Range   Glucose-Capillary 102 (H) 70 - 99 mg/dL  Glucose, capillary     Status: Abnormal   Collection Time: 11/06/18 11:30 PM  Result Value Ref Range   Glucose-Capillary 138 (H) 70 - 99 mg/dL  Renal function panel (daily at 0500)     Status: Abnormal   Collection Time: 11/07/18  4:07 AM  Result Value Ref Range   Sodium 133 (L) 135 - 145 mmol/L   Potassium 4.7 3.5 - 5.1 mmol/L   Chloride 96 (L) 98 - 111 mmol/L   CO2 25 22 - 32 mmol/L   Glucose, Bld 120 (H) 70 - 99 mg/dL   BUN 77 (H) 8 - 23 mg/dL   Creatinine, Ser 5.02 (H) 0.44 - 1.00 mg/dL   Calcium 9.0 8.9 - 10.3 mg/dL   Phosphorus 5.6 (H) 2.5 - 4.6 mg/dL   Albumin 2.3 (L) 3.5 - 5.0 g/dL   GFR calc non Af Amer 8 (L) >60 mL/min   GFR calc Af Amer 10 (L) >60 mL/min   Anion gap 12 5 - 15    Comment: Performed at [facility], [address] [place], [place] [address]  Heparin level (unfractionated)     Status: None   Collection Time: 11/07/18  4:07 AM  Result Value Ref Range   Heparin  Unfractionated 0.43 0.30 - 0.70 IU/mL    Comment: (NOTE) If heparin results are below expected values, and patient dosage has  been confirmed, suggest follow up testing of antithrombin III levels. Performed at [facility], [place] [address], [place], [place] [address]   CBC     Status: Abnormal   Collection Time: 11/07/18  4:07 AM  Result Value Ref Range   WBC 4.9 4.0 - 10.5 K/uL   RBC 2.79 (L) 3.87 - 5.11 MIL/uL   Hemoglobin 8.0 (L) 12.0 - 15.0 g/dL   HCT 28.0 (L) 36.0 - 46.0 %    MCV 100.4 (H) 80.0 - 100.0 fL   MCH 28.7 26.0 - 34.0 pg   MCHC 28.6 (L) 30.0 - 36.0 g/dL   RDW 28.4 (H) 11.5 - 15.5 %   Platelets 189 150 - 400 K/uL   nRBC 0.0 0.0 - 0.2 %    Comment: Performed at [facility], [place] [address], [place], [place] [address]  Glucose, capillary     Status: Abnormal   Collection Time: 11/07/18  4:12 AM  Result Value Ref Range   Glucose-Capillary 104 (H) 70 - 99 mg/dL  Glucose, capillary     Status: Abnormal   Collection Time: 11/07/18  7:51 AM  Result Value Ref Range   Glucose-Capillary 42 (LL) 70 - 99 mg/dL  Glucose, capillary     Status: None   Collection Time: 11/07/18  8:20 AM  Result Value Ref Range   Glucose-Capillary 98 70 - 99 mg/dL  Glucose, capillary     Status: Abnormal   Collection Time: 11/07/18 12:02 PM  Result Value Ref Range   Glucose-Capillary 65 (L) 70 - 99 mg/dL  Glucose, capillary     Status: None   Collection Time: 11/07/18  4:12 PM  Result Value Ref Range   Glucose-Capillary 81 70 - 99 mg/dL  Glucose, capillary     Status: None   Collection Time: 11/07/18  8:02 PM  Result Value Ref Range   Glucose-Capillary 93 70 - 99 mg/dL  Glucose, capillary     Status: None   Collection Time: 11/07/18 11:30 PM  Result Value Ref Range   Glucose-Capillary 95 70 - 99 mg/dL  Glucose, capillary     Status: Abnormal   Collection Time: 11/08/18 12:08 AM  Result Value Ref Range   Glucose-Capillary 107 (H) 70 - 99 mg/dL  Glucose, capillary     Status: Abnormal   Collection Time: 11/08/18  4:00 AM  Result Value Ref Range   Glucose-Capillary 113 (H) 70 - 99 mg/dL  Renal function panel (daily at 0500)     Status: Abnormal   Collection Time: 11/08/18  5:22 AM  Result Value Ref Range   Sodium 133 (L) 135 - 145 mmol/L   Potassium 4.4 3.5 - 5.1 mmol/L   Chloride 96 (L) 98 - 111 mmol/L   CO2 24 22 - 32 mmol/L   Glucose, Bld 107 (H) 70 - 99 mg/dL   BUN 43 (H) 8 - 23 mg/dL   Creatinine, Ser 4.01 (H) 0.44 - 1.00 mg/dL   Calcium 8.8 (L) 8.9 -  10.3 mg/dL   Phosphorus 4.4 2.5 - 4.6 mg/dL   Albumin 2.8 (L) 3.5 - 5.0 g/dL   GFR calc non Af Amer 11 (L) >60 mL/min   GFR calc Af Amer 13 (L) >60 mL/min   Anion gap 13 5 - 15    Comment: Performed at [facility], [address] [place], [place] [address]  Heparin level (unfractionated)     Status: None   Collection Time: 11/08/18  5:22 AM  Result Value Ref Range   Heparin Unfractionated 0.41 0.30 - 0.70 IU/mL    Comment: (NOTE) If heparin results are below expected values, and patient dosage has  been confirmed, suggest follow up testing of antithrombin III levels. Performed at [facility], [place] [address] [place], [place] [address]   CBC     Status: Abnormal   Collection Time: 11/08/18  5:22 AM  Result Value Ref Range   WBC 4.8 4.0 - 10.5 K/uL   RBC 3.08 (L) 3.87 - 5.11 MIL/uL   Hemoglobin 8.8 (L) 12.0 - 15.0 g/dL   HCT 30.3 (L) 36.0 - 46.0 %   MCV 98.4 80.0 - 100.0 fL   MCH 28.6 26.0 - 34.0 pg   MCHC 29.0 (L) 30.0 - 36.0 g/dL   RDW 28.0 (H) 11.5 - 15.5 %   Platelets 186 150 - 400 K/uL   nRBC 0.0 0.0 - 0.2 %    Comment: Performed at [facility], [place] [address] [place], [place] [address]  Glucose, capillary     Status: None   Collection Time: 11/08/18  7:07 AM  Result Value Ref Range   Glucose-Capillary 83 70 - 99 mg/dL   Ct Abdomen Wo Contrast  Result Date: 11/07/2018 CLINICAL DATA:  Evaluate gastric anatomy prior to potential percutaneous gastrostomy tube placement. EXAM: CT ABDOMEN WITHOUT CONTRAST TECHNIQUE: Multidetector CT imaging of the abdomen was performed following the standard protocol without IV contrast. COMPARISON:  Abdominal radiograph-11/06/2018; chest radiograph-10/29/2018; 10/21/2018 FINDINGS: Lower chest: Limited visualization of the lower thorax demonstrates extensive consolidative opacities within the imaged right lower lobe with associated air bronchograms. Mild interstitial thickening within the imaged lung bases as could be seen  in the setting pulmonary edema. Trace right-sided pleural effusion. Cardiomegaly. Coronary artery calcifications. There is mild diffuse decreased attenuation intra cardiac blood pool suggestive of anemia. No pericardial effusion. Hepatobiliary: Mild nodularity hepatic contour. Post cholecystectomy. No ascites. Pancreas: Pancreas appears atrophic. Spleen: Normal appearance of the spleen. Note is made of a small splenule about the splenic hilum. Adrenals/Urinary Tract: Calcifications noted about the bilateral renal hila, favored to be vascular in etiology. No definite renal stones. There is a minimal amount of likely age and body habitus related bilateral perinephric stranding. No urinary obstruction. Note is made of an approximately 1.6 x 1.3 cm nodule within the medial limb of the left adrenal gland (image 26, series 3 which is incompletely characterize though statistically most likely to represent a benign adrenal adenoma. Normal noncontrast appearance of the right adrenal gland. The urinary bladder was not imaged. Stomach/Bowel: The transverse colon and hepatic parenchyma are interposed between the ventral wall of the upper abdomen and anterior wall of the stomach, precluding percutaneous gastrostomy tube access. Moderate colonic stool burden without evidence of enteric obstruction. No pneumoperitoneum, pneumatosis or portal venous gas. Vascular/Lymphatic: Atherosclerotic plaque within a normal caliber abdominal aorta. No bulky retroperitoneal or mesenteric adenopathy on this noncontrast examination Other: Diffuse body wall anasarca. Ill-defined subcutaneous stranding about the midline of the chest (image 1, series 3), may represent the sequela of previous chest compressions during cardiac resuscitation. Musculoskeletal: No acute or aggressive osseous abnormalities. Stigmata of DISH in the lower thoracic spine. Bilateral facet degenerative change. IMPRESSION: 1. Gastric anatomy NOT amenable to percutaneous  gastrostomy tube placement secondary to interposition of the transverse colon and left lobe  of the liver. If gastrostomy tube placement is still desired, surgical consultation is advised. 2. Consolidative opacities within the right lower lobe with associated air bronchograms and trace right-sided pleural effusion, potentially atelectasis, though could be seen in the setting of infection and/or aspiration. Clinical correlation is advised. 3. Cardiomegaly. Coronary artery calcifications. Aortic Atherosclerosis (ICD10-I70.0). Electronically Signed   By: Sandi Mariscal M.D.   On: 11/07/2018 16:41   Dg Abd 1 View  Result Date: 11/06/2018 CLINICAL DATA:  Nasogastric tube placement EXAM: ABDOMEN - 1 VIEW COMPARISON:  Chest radiograph 11/06/2018 at 10:05 a.m. FINDINGS: The nasogastric tube tip projects over the stomach. Position is unchanged. The visualized bowel gas pattern and right lung basilar opacities are unchanged. IMPRESSION: Unchanged position of nasogastric tube with tip and side port projecting over the stomach. Electronically Signed   By: Ulyses Jarred M.D.   On: 11/06/2018 17:59      Assessment/Plan Active Problems:   Type 2 diabetes mellitus with other specified complication (HCC)   Dyslipidemia   OBESITY   Acute on chronic diastolic CHF (congestive heart failure) (HCC)   Acute kidney injury superimposed on chronic kidney disease (HCC)   Acute respiratory failure with hypoxia (HCC)   VAP (ventilator-associated pneumonia) (HCC)   Shock ([place])   Acute respiratory failure (HCC)   CHF (congestive heart failure) ([place])   Palliative care encounter   Dysphagia   Tracheostomy status ([place])  Open G tube - I spoke with speech and medicine today and we all agree to give the pt a little more time to see if she improves to downsize her trach and possibly get a FEES before proceeding with a lap G tube. Our concern with her respiratory status is that she would need prolonged ventilation after surgery.  -  small bore NGT for tube feeds - we will follow peripherally. Thank you for the consult.    Kalman Drape, [facility] 11/08/2018, 11:15 AM Pager: [phone] Consults: [phone] Mon-Fri 7:00 am-4:30 pm Sat-Sun 7:00 am-11:30 am

## 2018-11-08 NOTE — Progress Notes (Signed)
PROGRESS NOTE    Kathleen [name]  YQM:[id number] DOB: 01/04/1951 DOA: 09/22/2018 PCP: Aldine Contes, MD    Brief Narrative:  68 year old female presented to the outpatient cardiology clinic for follow-up.  She does have significant past medical history for permanent atrial fibrillation, hypertension, morbid obesity, type 2 diabetes mellitus, dyslipidemia, obesity hypoventilation, chronic kidney disease stage V, and diastolic heart failure.  She reported orthopnea, increased dyspnea on exertion, and increased lower extremity edema.  Patient failed for outpatient diuretic management, and she was directly admitted to the hospital for further evaluation.  She had a prolonged hospitalization.  Patient was initially placed on milrinone and furosemide.  May 30 she suffered from a cardiac arrest, pulseless electrical activity.  She recovered spontaneous circulation after 8 minutes of CPR.  She was intubated and supported with mechanical ventilation.  She had worsening kidney function required CRRT May 31.  She was extubated June 3, and reintubated June 11 for hypercapnic respiratory failure.  She remained ventilator dependent respiratory failure and on June 19 underwent tracheostomy.  She required vasopressors and inotropes for hemodynamic decompensation.  She was transferred to [facility] on July 12  Assessment & Plan:   Active Problems:   Type 2 diabetes mellitus with other specified complication (HCC)   Dyslipidemia   OBESITY   Acute on chronic diastolic CHF (congestive heart failure) (HCC)   Acute kidney injury superimposed on chronic kidney disease (HCC)   Acute respiratory failure with hypoxia (HCC)   VAP (ventilator-associated pneumonia) (HCC)   Shock ([place])   Acute respiratory failure (HCC)   CHF (congestive heart failure) ([place])   Palliative care encounter   1. Acute respiratory failure, hypoxic and hypercapnic. Now patient has been off mechanical ventilation since June 11. Tolerating  trach collar well. Fi02 60% with oxygen saturation 93%. Continue guaifenesin for cough management. Out of bed as tolerated and physical therapy. Possible LTAC.   2. Diastolic heart failure with hypotension. Patient continue of dopamine infusion, LV systolic function is 60 to 65%. Patient euvolemic today, fluid management per ultrafiltration, yesterday had 2,800 ml removed. Plan to bridge to midodrine once patient has a enteral route.   3. Stage V CKD due to ATN, related to shock. She was transitioned to regular HD on June 27, getting HD M-W-F. Episodic hypotension on HD, required IV albumin infusion. She has a right IJ tunneled HD catheter in place.   4. Swallow dysfunction. Her NG got dislodged, speech evaluation, recommendation to keep patient NPO due to risk of aspiration. Not candidate for G tube per IR due to anatomy. I called surgery for further evaluation. She needs enteral port for nutrition.   5. Chronic atrial fibrillation. Patient has remained rate control, HR 50 to 60, continue to anticoagulation with heparin for now.   6. T2DM. Continue glucose cover and monitoring with insulin sliding scale, basal insulin 40 units bid, fasting glucose 107. Will decrease basal insulin to 20 qhs, she is npo now, no ng tube and risk of hypoglycemia.   7. Chronic anemia with iron deficiency. Multifactorial continue cell count monitoring, keep Hgb above 7. Sp IV iron infusion.   8. Morbid obesity. BMI 44.   9. Ventilator associated pneumonia (not present on admission). Proteus. Completed antibiotic therapy on 07/08. No further antibiotic therapy.   DVT prophylaxis: scd   Code Status: full Family Communication: No family at the bedside  Disposition Plan/ discharge barriers: Possible LTAC   Body mass index is 44.09 kg/m. Malnutrition Type:  Nutrition Problem: Inadequate  oral intake Etiology: inability to eat   Malnutrition Characteristics:  Signs/Symptoms: NPO status   Nutrition  Interventions:  Interventions: Tube feeding, Prostat  RN Pressure Injury Documentation:     Consultants:   Surgery   Pulmonary   Procedures:   Trach Jun 19  Antimicrobials:       Subjective: Patient has been off mechanical ventilation since June 11, continue to be NPO, this am with no dyspnea or chest pain. No nausea or vomiting.   Objective: Vitals:   11/08/18 0624 11/08/18 0630 11/08/18 0756 11/08/18 0759  BP:  (!) 82/38 (!) 117/52   Pulse:  (!) 57 (!) 59   Resp:  (!) 23 16   Temp:    (!) 97.4 F (36.3 C)  TempSrc:    Axillary  SpO2:  93% 93%   Weight: 102.4 kg     Height:        Intake/Output Summary (Last 24 hours) at 11/08/2018 0849 Last data filed at 11/08/2018 0600 Gross per 24 hour  Intake 961.03 ml  Output 2800 ml  Net -1838.97 ml   Filed Weights   11/07/18 1200 11/07/18 1510 11/08/18 0624  Weight: 105.3 kg 102.5 kg 102.4 kg    Examination:   General: deconditioned  Neurology: Awake and alert, non focal  E ENT: mild pallor, no icterus, oral mucosa moist Cardiovascular: No JVD. S1-S2 present, rhythmic, no gallops, rubs, or murmurs. Trace positive lower extremity edema. Pulmonary: positive breath sounds bilaterally, adequate air movement, no wheezing, rhonchi or rales. Gastrointestinal. Abdomen protuberant with no organomegaly, non tender, no rebound or guarding Skin. No rashes Musculoskeletal: no joint deformities     Data Reviewed: I have personally reviewed following labs and imaging studies  CBC: Recent Labs  Lab 11/04/18 0500 11/05/18 0315 11/06/18 0419 11/07/18 0407 11/08/18 0522  WBC 6.4 5.5 5.5 4.9 4.8  HGB 7.9* 8.0* 7.9* 8.0* 8.8*  HCT 26.8* 27.0* 26.9* 28.0* 30.3*  MCV 96.8 99.3 97.8 100.4* 98.4  PLT 190 195 209 189 940   Basic Metabolic Panel: Recent Labs  Lab 11/04/18 0500 11/05/18 0315 11/06/18 0419 11/07/18 0407 11/08/18 0522  NA 133* 134* 133* 133* 133*  K 4.7 4.1 4.7 4.7 4.4  CL 97* 97* 95* 96* 96*  CO2  22 27 25 25 24   GLUCOSE 113* 127* 105* 120* 107*  BUN 71* 34* 58* 77* 43*  CREATININE 4.20* 2.98* 4.05* 5.02* 4.01*  CALCIUM 8.6* 8.5* 8.8* 9.0 8.8*  PHOS 4.1 3.0 4.1 5.6* 4.4   GFR: Estimated Creatinine Clearance: 14.7 mL/min (A) (by C-G formula based on SCr of 4.01 mg/dL (H)). Liver Function Tests: Recent Labs  Lab 11/04/18 0500 11/05/18 0315 11/06/18 0419 11/07/18 0407 11/08/18 0522  ALBUMIN 2.2* 2.5* 2.3* 2.3* 2.8*   No results for input(s): LIPASE, AMYLASE in the last 168 hours. No results for input(s): AMMONIA in the last 168 hours. Coagulation Profile: No results for input(s): INR, PROTIME in the last 168 hours. Cardiac Enzymes: No results for input(s): CKTOTAL, CKMB, CKMBINDEX, TROPONINI in the last 168 hours. BNP (last 3 results) No results for input(s): PROBNP in the last 8760 hours. HbA1C: No results for input(s): HGBA1C in the last 72 hours. CBG: Recent Labs  Lab 11/07/18 2002 11/07/18 2330 11/08/18 0008 11/08/18 0400 11/08/18 0707  GLUCAP 93 95 107* 113* 83   Lipid Profile: No results for input(s): CHOL, HDL, LDLCALC, TRIG, CHOLHDL, LDLDIRECT in the last 72 hours. Thyroid Function Tests: No results for input(s): TSH, T4TOTAL, FREET4, T3FREE,  THYROIDAB in the last 72 hours. Anemia Panel: No results for input(s): VITAMINB12, FOLATE, FERRITIN, TIBC, IRON, RETICCTPCT in the last 72 hours.    Radiology Studies: I have reviewed all of the imaging during this hospital visit personally     Scheduled Meds: . aspirin  81 mg Per Tube Daily  . atorvastatin  40 mg Per Tube q1800  . chlorhexidine gluconate (MEDLINE KIT)  15 mL Mouth Rinse BID  . Chlorhexidine Gluconate Cloth  6 each Topical Daily  . Chlorhexidine Gluconate Cloth  6 each Topical Daily  . [START ON 11/09/2018] darbepoetin (ARANESP) injection - NON-DIALYSIS  200 mcg Subcutaneous Q Wed-1800  . famotidine  20 mg Per Tube Daily  . feeding supplement (PRO-STAT SUGAR FREE 64)  30 mL Per Tube TID   . fludrocortisone  0.2 mg Oral Daily  . guaiFENesin  15 mL Per Tube Q6H  . insulin aspart  0-20 Units Subcutaneous Q4H  . insulin detemir  40 Units Subcutaneous BID  . midodrine  10 mg Oral TID WC  . polyethylene glycol  17 g Oral Daily  . sodium chloride flush  10-40 mL Intracatheter Q12H   Continuous Infusions: . sodium chloride 250 mL (10/31/18 2358)  . sodium chloride    . sodium chloride    . albumin human 25 g (11/07/18 1315)  . dextrose 5 % and 0.9% NaCl 30 mL/hr at 11/08/18 0622  . DOPamine 5 mcg/kg/min (11/07/18 2352)  . feeding supplement (OSMOLITE 1.5 CAL) Stopped (11/07/18 0415)  . ferric gluconate (FERRLECIT/NULECIT) IV 125 mg (11/07/18 1410)  . heparin 1,500 Units/hr (11/08/18 0600)     LOS: 47 days        Mauricio Gerome Apley, MD

## 2018-11-08 NOTE — Progress Notes (Signed)
SLP Cancellation Note  Patient Details Name: Kathleen [name] MRN: [id number] DOB: [birth year]   Cancelled treatment:       Reason Eval/Treat Not Completed: Medical issues which prohibited therapy. Discussed with surgery as well as critical care. Pt is not a candidate for PEG given anatomy and will require surgical placement of g-tube. They have concerns about her current respiratory status and her ability to wean post-surgery. Per critical care, she has increased FiO2 needs today. Discussed plan with PCCM, including s/s of aspiration with ice chips and secretions and inability to wear PMV. We also discussed the potential of pursuing FEES to better assess oropharyngeal function to aid in decision making, but at this time, he believes that she is not appropriate for this from a respiratory standpoint. SLP will hold tx for today, pending improvements in overall respiratory status.   [name] 11/08/2018, 11:47 AM  Pollyann Glen, M.A. [place] [employer] Environmental education officer [phone] Office [phone]

## 2018-11-08 NOTE — Progress Notes (Signed)
[facility] Progress Note    Assessment/ Plan:   1.  Acute kidney injury Superimposed on CKD IV (baseline 1.7-1.9) likely ATN secondary to shock, anuric: Transitioned to iHD on 6/27. Using right [facility] and on MWF schedule.  She is on midodrine for hypotension at 20 TID and florinef 0.2 mg daily added 7/4.  HD 7/6, 7/8, HD 7/10.  No signs of recovery at present- am not hopeful that she will be liberated from dialysis. Continue supportive care with dialysis at this present time. Next planned HD tomorrow - hopefully will have NG or G tube so we can use pre HD midodrine for BP support; used albumin yesterday due to no NG.  Will obtain vein mapping today and plan to d/w VVS re: perm access prior to discharge.   2.  Proteus +respiratory culture: s/p cefepime, ended 7/8 per PCCM.    3.  Chronic respiratory failure: Status post tracheostomy.  Currently on vent, per pulmonary, weaning as tolerated. Now TC.  4.  Acute on chronic diastolic heart failure: Appreciate the expertise of the heart failure team.  Plans to try to correlate bradycardia with hypotension to see if could/ would be a candidate for TVP/ RV micro device--> not a candidate for these interventions per notes; not felt to be a candidate for PPM per EP team.  She is off dopamine at present--> and tolerated dialysis off dopamine 7/10 and 7/13.  5.  Anemia due to chronic disease and acute blood loss: Continue ESA, Increase Aranesp to 200 mcg for 7/15, on a course of IV iron.    6.  Bradycardia, PEA cardiac arrest/ Afib :  As above in #4. On hep gtt as well.  7.  Dispo: remains in ICU.  Palliative care discussion noted and greatly appreciated.  Looks like a good LTAC candidate.  Subjective:    IR reviewed imaging and no perc G tube possible due to anatomy - rec surgical placement.  Speech/swallow eval - to early for po diet. S/p HD yesterday without issue. UF 2.8L.  No new issues.    Objective:   BP (!) 117/52   Pulse (!)  59   Temp (!) 97.4 F (36.3 C) (Axillary)   Resp 16   Ht 5' (1.524 m)   Wt 102.4 kg   SpO2 93%   BMI 44.09 kg/m   Intake/Output Summary (Last 24 hours) at 11/08/2018 0842 Last data filed at 11/08/2018 0600 Gross per 24 hour  Intake 961.03 ml  Output 2800 ml  Net -1838.97 ml   Weight change:   Physical Exam: Gen: obese, sleepy in bed, NAD HEENT: trach in place, on TC CVS: bradycardic, seems RRR Resp:dim breath sounds bilaterally Abd: obese Ext: trace LE edema ACCESS: R IJ TDC  Imaging: Ct Abdomen Wo Contrast  Result Date: 11/07/2018 CLINICAL DATA:  Evaluate gastric anatomy prior to potential percutaneous gastrostomy tube placement. EXAM: CT ABDOMEN WITHOUT CONTRAST TECHNIQUE: Multidetector CT imaging of the abdomen was performed following the standard protocol without IV contrast. COMPARISON:  Abdominal radiograph-11/06/2018; chest radiograph-10/29/2018; 10/21/2018 FINDINGS: Lower chest: Limited visualization of the lower thorax demonstrates extensive consolidative opacities within the imaged right lower lobe with associated air bronchograms. Mild interstitial thickening within the imaged lung bases as could be seen in the setting pulmonary edema. Trace right-sided pleural effusion. Cardiomegaly. Coronary artery calcifications. There is mild diffuse decreased attenuation intra cardiac blood pool suggestive of anemia. No pericardial effusion. Hepatobiliary: Mild nodularity hepatic contour. Post cholecystectomy. No ascites. Pancreas: Pancreas appears  atrophic. Spleen: Normal appearance of the spleen. Note is made of a small splenule about the splenic hilum. Adrenals/Urinary Tract: Calcifications noted about the bilateral renal hila, favored to be vascular in etiology. No definite renal stones. There is a minimal amount of likely age and body habitus related bilateral perinephric stranding. No urinary obstruction. Note is made of an approximately 1.6 x 1.3 cm nodule within the medial limb of  the left adrenal gland (image 26, series 3 which is incompletely characterize though statistically most likely to represent a benign adrenal adenoma. Normal noncontrast appearance of the right adrenal gland. The urinary bladder was not imaged. Stomach/Bowel: The transverse colon and hepatic parenchyma are interposed between the ventral wall of the upper abdomen and anterior wall of the stomach, precluding percutaneous gastrostomy tube access. Moderate colonic stool burden without evidence of enteric obstruction. No pneumoperitoneum, pneumatosis or portal venous gas. Vascular/Lymphatic: Atherosclerotic plaque within a normal caliber abdominal aorta. No bulky retroperitoneal or mesenteric adenopathy on this noncontrast examination Other: Diffuse body wall anasarca. Ill-defined subcutaneous stranding about the midline of the chest (image 1, series 3), may represent the sequela of previous chest compressions during cardiac resuscitation. Musculoskeletal: No acute or aggressive osseous abnormalities. Stigmata of DISH in the lower thoracic spine. Bilateral facet degenerative change. IMPRESSION: 1. Gastric anatomy NOT amenable to percutaneous gastrostomy tube placement secondary to interposition of the transverse colon and left lobe of the liver. If gastrostomy tube placement is still desired, surgical consultation is advised. 2. Consolidative opacities within the right lower lobe with associated air bronchograms and trace right-sided pleural effusion, potentially atelectasis, though could be seen in the setting of infection and/or aspiration. Clinical correlation is advised. 3. Cardiomegaly. Coronary artery calcifications. Aortic Atherosclerosis (ICD10-I70.0). Electronically Signed   By: Sandi Mariscal M.D.   On: 11/07/2018 16:41   Dg Abd 1 View  Result Date: 11/06/2018 CLINICAL DATA:  Nasogastric tube placement EXAM: ABDOMEN - 1 VIEW COMPARISON:  Chest radiograph 11/06/2018 at 10:05 a.m. FINDINGS: The nasogastric tube  tip projects over the stomach. Position is unchanged. The visualized bowel gas pattern and right lung basilar opacities are unchanged. IMPRESSION: Unchanged position of nasogastric tube with tip and side port projecting over the stomach. Electronically Signed   By: Ulyses Jarred M.D.   On: 11/06/2018 17:59   Dg Abd 1 View  Result Date: 11/06/2018 CLINICAL DATA:  Nasogastric tube placement EXAM: ABDOMEN - 1 VIEW COMPARISON:  September 30, 2018 FINDINGS: Nasogastric tube tip and side port are in the in stomach. The visualized bowel gas pattern is unremarkable without evidence of bowel obstruction or free air. There are surgical clips in right upper abdomen. There is cardiomegaly. There is airspace consolidation in the medial right base. Tracheostomy catheter tip is 4.6 cm above the carina. Central catheter tip is in the superior vena cava. IMPRESSION: Nasogastric tube tip and port in stomach. Visualized bowel gas pattern unremarkable. Stable cardiomegaly. Airspace consolidation consistent with pneumonia medial right base. Electronically Signed   By: Lowella Grip III M.D.   On: 11/06/2018 10:35    Labs: BMET Recent Labs  Lab 11/02/18 0343 11/03/18 0503 11/04/18 0500 11/05/18 0315 11/06/18 0419 11/07/18 0407 11/08/18 0522  NA 128* 134* 133* 134* 133* 133* 133*  K 3.8 3.9 4.7 4.1 4.7 4.7 4.4  CL 94* 98 97* 97* 95* 96* 96*  CO2 21* 24 22 27 25 25 24   GLUCOSE 266* 154* 113* 127* 105* 120* 107*  BUN 72* 45* 71* 34* 58* 77* 43*  CREATININE 4.42* 3.03* 4.20* 2.98* 4.05* 5.02* 4.01*  CALCIUM 8.3* 8.1* 8.6* 8.5* 8.8* 9.0 8.8*  PHOS 3.7 3.1 4.1 3.0 4.1 5.6* 4.4   CBC Recent Labs  Lab 11/05/18 0315 11/06/18 0419 11/07/18 0407 11/08/18 0522  WBC 5.5 5.5 4.9 4.8  HGB 8.0* 7.9* 8.0* 8.8*  HCT 27.0* 26.9* 28.0* 30.3*  MCV 99.3 97.8 100.4* 98.4  PLT 195 209 189 186    Medications:    . aspirin  81 mg Per Tube Daily  . atorvastatin  40 mg Per Tube q1800  . chlorhexidine gluconate (MEDLINE  KIT)  15 mL Mouth Rinse BID  . Chlorhexidine Gluconate Cloth  6 each Topical Daily  . Chlorhexidine Gluconate Cloth  6 each Topical Daily  . [START ON 11/09/2018] darbepoetin (ARANESP) injection - NON-DIALYSIS  200 mcg Subcutaneous Q Wed-1800  . famotidine  20 mg Per Tube Daily  . feeding supplement (PRO-STAT SUGAR FREE 64)  30 mL Per Tube TID  . fludrocortisone  0.2 mg Oral Daily  . guaiFENesin  15 mL Per Tube Q6H  . insulin aspart  0-20 Units Subcutaneous Q4H  . insulin detemir  40 Units Subcutaneous BID  . midodrine  10 mg Oral TID WC  . polyethylene glycol  17 g Oral Daily  . sodium chloride flush  10-40 mL Intracatheter Q12H    Jannifer Hick MD [facility] Pager [phone]

## 2018-11-08 NOTE — Progress Notes (Signed)
Patient's son and daughter in law updated about patient's condition and plan to not place PEG tomorrow d/t patient's respiratory status.    Question of if this will impede the patient from being able to go to [facility]. Patient's daughter in law said she would discuss with case worker.   Bo Merino RN

## 2018-11-09 ENCOUNTER — Inpatient Hospital Stay (HOSPITAL_COMMUNITY): Payer: Medicare HMO

## 2018-11-09 LAB — RENAL FUNCTION PANEL
Albumin: 2.7 g/dL — ABNORMAL LOW (ref 3.5–5.0)
Anion gap: 13 (ref 5–15)
BUN: 50 mg/dL — ABNORMAL HIGH (ref 8–23)
CO2: 24 mmol/L (ref 22–32)
Calcium: 9.1 mg/dL (ref 8.9–10.3)
Chloride: 96 mmol/L — ABNORMAL LOW (ref 98–111)
Creatinine, Ser: 5 mg/dL — ABNORMAL HIGH (ref 0.44–1.00)
GFR calc Af Amer: 10 mL/min — ABNORMAL LOW (ref >60)
GFR calc non Af Amer: 8 mL/min — ABNORMAL LOW (ref >60)
Glucose, Bld: 142 mg/dL — ABNORMAL HIGH (ref 70–99)
Phosphorus: 5.6 mg/dL — ABNORMAL HIGH (ref 2.5–4.6)
Potassium: 4.9 mmol/L (ref 3.5–5.1)
Sodium: 133 mmol/L — ABNORMAL LOW (ref 135–145)

## 2018-11-09 LAB — HEPARIN LEVEL (UNFRACTIONATED): Heparin Unfractionated: 0.24 IU/mL — ABNORMAL LOW (ref 0.30–0.70)

## 2018-11-09 LAB — CBC
HCT: 29.2 % — ABNORMAL LOW (ref 36.0–46.0)
Hemoglobin: 8.6 g/dL — ABNORMAL LOW (ref 12.0–15.0)
MCH: 28.6 pg (ref 26.0–34.0)
MCHC: 29.5 g/dL — ABNORMAL LOW (ref 30.0–36.0)
MCV: 97 fL (ref 80.0–100.0)
Platelets: 158 10*3/uL (ref 150–400)
RBC: 3.01 MIL/uL — ABNORMAL LOW (ref 3.87–5.11)
RDW: 26.6 % — ABNORMAL HIGH (ref 11.5–15.5)
WBC: 5.6 10*3/uL (ref 4.0–10.5)
nRBC: 0 % (ref 0.0–0.2)

## 2018-11-09 LAB — GLUCOSE, CAPILLARY
Glucose-Capillary: 112 mg/dL — ABNORMAL HIGH (ref 70–99)
Glucose-Capillary: 116 mg/dL — ABNORMAL HIGH (ref 70–99)
Glucose-Capillary: 122 mg/dL — ABNORMAL HIGH (ref 70–99)
Glucose-Capillary: 136 mg/dL — ABNORMAL HIGH (ref 70–99)
Glucose-Capillary: 156 mg/dL — ABNORMAL HIGH (ref 70–99)
Glucose-Capillary: 160 mg/dL — ABNORMAL HIGH (ref 70–99)
Glucose-Capillary: 61 mg/dL — ABNORMAL LOW (ref 70–99)
Glucose-Capillary: 97 mg/dL (ref 70–99)

## 2018-11-09 MED ORDER — INSULIN DETEMIR 100 UNIT/ML ~~LOC~~ SOLN
10.0000 [IU] | Freq: Every day | SUBCUTANEOUS | Status: DC
  Administered 2018-11-09 – 2018-11-15 (×7): 10 [IU] via SUBCUTANEOUS
  Filled 2018-11-09 (×11): qty 0.1

## 2018-11-09 MED ORDER — HEPARIN SODIUM (PORCINE) 1000 UNIT/ML IJ SOLN
INTRAMUSCULAR | Status: AC
  Administered 2018-11-09: 1000 [IU] via INTRAVENOUS_CENTRAL
  Filled 2018-11-09: qty 4

## 2018-11-09 MED ORDER — DEXTROSE 50 % IV SOLN
12.5000 g | INTRAVENOUS | Status: AC
  Administered 2018-11-09: 09:00:00 12.5 g via INTRAVENOUS

## 2018-11-09 MED ORDER — FUROSEMIDE 10 MG/ML IJ SOLN
40.0000 mg | Freq: Three times a day (TID) | INTRAMUSCULAR | Status: AC
  Administered 2018-11-09 (×2): 40 mg via INTRAVENOUS
  Filled 2018-11-09 (×2): qty 4

## 2018-11-09 MED ORDER — ASPIRIN 81 MG PO CHEW
81.0000 mg | CHEWABLE_TABLET | Freq: Every day | ORAL | Status: DC
  Administered 2018-11-09 – 2018-11-17 (×9): 81 mg via ORAL
  Filled 2018-11-09 (×9): qty 1

## 2018-11-09 MED ORDER — DEXTROSE 50 % IV SOLN
INTRAVENOUS | Status: AC
  Administered 2018-11-09: 12.5 g via INTRAVENOUS
  Filled 2018-11-09: qty 50

## 2018-11-09 MED ORDER — FAMOTIDINE 40 MG/5ML PO SUSR
20.0000 mg | Freq: Every day | ORAL | Status: DC
  Administered 2018-11-09 – 2018-11-14 (×6): 20 mg
  Filled 2018-11-09 (×7): qty 2.5

## 2018-11-09 NOTE — Progress Notes (Signed)
PROGRESS NOTE    Kathleen [name]  FXJ:[id number] DOB: Oct 28, 1950 DOA: 09/22/2018 PCP: Aldine Contes, MD    Brief Narrative:  68 year old female presented to the outpatient cardiology clinic for follow-up.  She does have significant past medical history for permanent atrial fibrillation, hypertension, morbid obesity, type 2 diabetes mellitus, dyslipidemia, obesity hypoventilation, chronic kidney disease stage V, and diastolic heart failure.  She reported orthopnea, increased dyspnea on exertion, and increased lower extremity edema.  Patient failed for outpatient diuretic management, and she was directly admitted to the hospital for further evaluation.  She had a prolonged hospitalization.  Patient was initially placed on milrinone and furosemide.  May 30 she suffered from a cardiac arrest, pulseless electrical activity.  She recovered spontaneous circulation after 8 minutes of CPR.  She was intubated and supported with mechanical ventilation.  She had worsening kidney function required CRRT May 31.  She was extubated June 3, and reintubated June 11 for hypercapnic respiratory failure.  She remained ventilator dependent respiratory failure and on June 19 underwent tracheostomy.  She required vasopressors and inotropes for hemodynamic decompensation.  She was transferred to [facility] on July 12   Assessment & Plan:   Active Problems:   Type 2 diabetes mellitus with other specified complication (HCC)   Dyslipidemia   OBESITY   Acute on chronic diastolic CHF (congestive heart failure) (HCC)   Acute kidney injury superimposed on chronic kidney disease (HCC)   Acute respiratory failure with hypoxia (HCC)   VAP (ventilator-associated pneumonia) (HCC)   Shock ([place])   Acute respiratory failure (HCC)   CHF (congestive heart failure) ([place])   Palliative care encounter   Dysphagia   Tracheostomy status ([place])   1. Acute respiratory failure, hypoxic and hypercapnic.  off mechanical ventilation  since June 11. Continue with trach collar well. Fi02 down to 40% with oxygen saturation 97%. Continue physical therapy. NG tube placed for nutrition, continue to follow with swallow evaluation, if persistent dysfunction will need gastric tube per surgery team.   2. Diastolic heart failure with hypotension. Continue of dopamine infusion, ultrafiltration per HD. Will resume midodrine per NG, in order to wean of inotropic therapy. On fludrocortisone.   3. Stage V CKD due to ATN, related to shock.  Transitioned to regular HD on June 27, getting HD M-W-F. Continue HD with ultrafiltration per nephrology recommendations. Furosemide x 2 today.   4. Swallow dysfunction. Continue nutrition per NG feedings, plan to continue swallow evaluation and therapy, if continue dysfunction, plan for gastric tube.   5. Chronic atrial fibrillation. Rate controlled, anticoagulation with heparin IV.   6. T2DM. Now with decrease dose of basal insulin, fasting glucose this am at 142, will continue glucose cover and monitoring, tube feedings will be resumed.   7. Chronic anemia with iron deficiency. Hgb has remained stable, continue check cell count as needed.  8. Morbid obesity with dysplidemia. Her calculated BMI 44. Continue atorvastatin.   9. Ventilator associated pneumonia (not present on admission). Proteus. Completed antibiotic therapy on 07/08. Patient has remained stable, tolerating well trach collar.   DVT prophylaxis: scd   Code Status: full Family Communication: No family at the bedside  Disposition Plan/ discharge barriers: Possible LTAC   Body mass index is 44.09 kg/m. Malnutrition Type:  Nutrition Problem: Inadequate oral intake Etiology: inability to eat   Malnutrition Characteristics:  Signs/Symptoms: NPO status   Nutrition Interventions:  Interventions: Tube feeding, Prostat  RN Pressure Injury Documentation:     Consultants:   Pulmonary  Nephrology   Surgery   IR    Procedures:   Trach   Antimicrobials:       Subjective: Patient continue to be NPO, NG tube has been placed this am for feedings, continue on dopamine infusion.   Objective: Vitals:   11/09/18 0800 11/09/18 0801 11/09/18 0900 11/09/18 1134  BP: (!) 101/49 (!) 101/49 (!) 109/45 129/63  Pulse: (!) 52 (!) 53 (!) 53 (!) 53  Resp: 20 18 20 19   Temp:  98.1 F (36.7 C)  97.9 F (36.6 C)  TempSrc:  Oral  Oral  SpO2: 94% 95% 97% 96%  Weight:      Height:        Intake/Output Summary (Last 24 hours) at 11/09/2018 1201 Last data filed at 11/09/2018 0600 Gross per 24 hour  Intake 834.43 ml  Output -  Net 834.43 ml   Filed Weights   11/07/18 1200 11/07/18 1510 11/08/18 0624  Weight: 105.3 kg 102.5 kg 102.4 kg    Examination:   General: deconditioned  Neurology: Awake and alert, non focal  E ENT: mild pallor, no icterus, oral mucosa moist/ trach in place.  Cardiovascular: No JVD. S1-S2 present, rhythmic, no gallops, rubs, or murmurs. No lower extremity edema. Pulmonary: positive breath sounds bilaterally, adequate air movement, no wheezing, rhonchi or rales. Gastrointestinal. Abdomen mild distended with no organomegaly, non tender, no rebound or guarding Skin. No rashes Musculoskeletal: no joint deformities     Data Reviewed: I have personally reviewed following labs and imaging studies  CBC: Recent Labs  Lab 11/04/18 0500 11/05/18 0315 11/06/18 0419 11/07/18 0407 11/08/18 0522  WBC 6.4 5.5 5.5 4.9 4.8  HGB 7.9* 8.0* 7.9* 8.0* 8.8*  HCT 26.8* 27.0* 26.9* 28.0* 30.3*  MCV 96.8 99.3 97.8 100.4* 98.4  PLT 190 195 209 189 716   Basic Metabolic Panel: Recent Labs  Lab 11/05/18 0315 11/06/18 0419 11/07/18 0407 11/08/18 0522 11/09/18 0259  NA 134* 133* 133* 133* 133*  K 4.1 4.7 4.7 4.4 4.9  CL 97* 95* 96* 96* 96*  CO2 27 25 25 24 24   GLUCOSE 127* 105* 120* 107* 142*  BUN 34* 58* 77* 43* 50*  CREATININE 2.98* 4.05* 5.02* 4.01* 5.00*  CALCIUM 8.5* 8.8*  9.0 8.8* 9.1  PHOS 3.0 4.1 5.6* 4.4 5.6*   GFR: Estimated Creatinine Clearance: 11.8 mL/min (A) (by C-G formula based on SCr of 5 mg/dL (H)). Liver Function Tests: Recent Labs  Lab 11/05/18 0315 11/06/18 0419 11/07/18 0407 11/08/18 0522 11/09/18 0259  ALBUMIN 2.5* 2.3* 2.3* 2.8* 2.7*   No results for input(s): LIPASE, AMYLASE in the last 168 hours. No results for input(s): AMMONIA in the last 168 hours. Coagulation Profile: No results for input(s): INR, PROTIME in the last 168 hours. Cardiac Enzymes: No results for input(s): CKTOTAL, CKMB, CKMBINDEX, TROPONINI in the last 168 hours. BNP (last 3 results) No results for input(s): PROBNP in the last 8760 hours. HbA1C: No results for input(s): HGBA1C in the last 72 hours. CBG: Recent Labs  Lab 11/08/18 2356 11/09/18 0439 11/09/18 0821 11/09/18 0919 11/09/18 1142  GLUCAP 116* 136* 61* 112* 97   Lipid Profile: No results for input(s): CHOL, HDL, LDLCALC, TRIG, CHOLHDL, LDLDIRECT in the last 72 hours. Thyroid Function Tests: No results for input(s): TSH, T4TOTAL, FREET4, T3FREE, THYROIDAB in the last 72 hours. Anemia Panel: No results for input(s): VITAMINB12, FOLATE, FERRITIN, TIBC, IRON, RETICCTPCT in the last 72 hours.    Radiology Studies: I have reviewed all of the  imaging during this hospital visit personally     Scheduled Meds: . aspirin  150 mg Rectal Daily  . atorvastatin  40 mg Per Tube q1800  . chlorhexidine gluconate (MEDLINE KIT)  15 mL Mouth Rinse BID  . Chlorhexidine Gluconate Cloth  6 each Topical Daily  . Chlorhexidine Gluconate Cloth  6 each Topical Daily  . darbepoetin (ARANESP) injection - NON-DIALYSIS  200 mcg Subcutaneous Q Wed-1800  . feeding supplement (PRO-STAT SUGAR FREE 64)  30 mL Per Tube TID  . fludrocortisone  0.2 mg Oral Daily  . guaiFENesin  15 mL Per Tube Q6H  . insulin aspart  0-20 Units Subcutaneous Q4H  . insulin detemir  20 Units Subcutaneous QHS  . midodrine  10 mg Oral TID  WC  . polyethylene glycol  17 g Oral Daily  . sodium chloride flush  10-40 mL Intracatheter Q12H   Continuous Infusions: . sodium chloride 250 mL (10/31/18 2358)  . sodium chloride    . sodium chloride    . albumin human 25 g (11/07/18 1315)  . dextrose 5 % and 0.9% NaCl 30 mL/hr at 11/09/18 0600  . DOPamine 7.5 mcg/kg/min (11/08/18 2304)  . famotidine (PEPCID) IV Stopped (11/08/18 1457)  . feeding supplement (OSMOLITE 1.5 CAL) Stopped (11/07/18 0415)  . ferric gluconate (FERRLECIT/NULECIT) IV 125 mg (11/07/18 1410)  . heparin 1,600 Units/hr (11/09/18 0600)     LOS: 48 days        Amand Lemoine Gerome Apley, MD

## 2018-11-09 NOTE — Progress Notes (Signed)
NAME:  Kathleen [name], MRN:  [id number], DOB:  15-Jun-1950, LOS: 68 ADMISSION DATE:  09/22/2018, CONSULTATION DATE: 09/24/2018 REFERRING MD: Candee Furbish MD, CHIEF COMPLAINT: Cardiac arrest  Brief History   68 y/o obese woman with chronic diastolic heart failure who failed outpatient diuretics management due to chronic kidney disease.  Right heart cath on 5/29 showed markedly elevated biventricular filling pressures with normal cardiac output.  She was on cardiology service, maintained on milrinone and Lasix.  On the morning of 5/30 she went into PEA arrest, CPR performed for 8 minutes.  Intubated and transferred to ICU.  Started on CRRT 5/31.  Extubated 6/3.  Re-intubated early am 6/11 for hypercarbic respiratory failure and underwent tracheostomy on 6/19  Past Medical History   has a past medical history of Atrial flutter ([place]), Bradycardia, Chronic diastolic (congestive) heart failure ([place]) (01/10/2015), CKD (chronic kidney disease), stage IV ([place]), Degenerative joint disease of hand, Diabetes mellitus, Dyslipidemia, Fecal occult blood test positive, GERD (gastroesophageal reflux disease), Headache, Hypertension, Inadequate material resources, Irritable bowel syndrome, Morbid obesity ([place]), Obesity hypoventilation syndrome ([place]), Post-menopausal bleeding, and Shortness of breath dyspnea.  Significant Hospital Events   5/28 Admit 5/29 RHC 5/30 PEA arrest, intubated/ CRRT 5/31 Milrinone stopped due to ectopy; brady episode- amio stopped 6/02 Attempt at SBT, chest x-ray with worsening pulmonary edema, CVVHD for volume removal 6/03 Extubated > remain on CRRT.  Refused nocturnal BiPAP 6/04 Refused nocturnal BiPAP 6/10 off CRRT 6/11 Intubated early am with hypercarbia, concern for RUL PNA; abx broadened; restarted on CRRT 6/16  No events overnight, weaning on high PS 6/17  failed vent wean, remains on levophed, midodrine and CRRT 6/18  Weaned for 5 hours on PSV 8/5 before requiring full support;  ongoing CRRT, remains on levophed 14 mcg 6/19 - TRACH + Dr Erskine Emery 6/20 -. On IV heparin gt, levophed gtt with midodrine. Off fent gtt. Pressopr needs down 6/26 - TCT tolerated for the past 24 hours  6/27 - Stable on TCT now >48hrs, tolerated first session of iHD  6/30 hypotension / bradycardia overnight, started on dopamine 7/2 Placed on ATC at 40% fiO2 7/6 Palliative care consult/ iHD 7/7 remains dependent on dopamine at 7, for bradycardia/ hypotension.   Palliative consult noted - family would like to visit 7/9 for goals of care meeting. 7/08 3.5 L off w/ iHD, weaned well all day, dopamine off late evening 7/9 great day, family meeting w/son and PMT, remains full code per patient 7/11 to ATC 7/13 remains on ATC  Consults:  Cardiology PCCM  Nephrology  EP  Procedures:  Lt PICC 5/29 >> 6/30 OETT 5/30 >> 6/3; 6/11 > 6/19 - TRACH (Dr Erskine Emery) >> R IJ HD cath 5/30 >> Aline left ulnar 5/30 >> 6/2 R nare cortrak 6/5 >> 7/13  Significant Diagnostic Tests:  TTE 5/28 >>The left ventricle has normal systolic function with an ejection fraction of 60-65%. . The right ventricle has normal systolic function. The cavity was mildly enlarged.   Right heart cath 5/29 RA = 24 RV = 94/26 PA = 95/36 (53) PCW = 30 (v = 50) Fick cardiac output/index = 6.0/2.7 PVR =3.9 WU FA sat = 91% PA sat = 58%, 58% SVC 60%  Micro Data:  SARS coronavirus 2 cepheid 5/28 >> neg MRSA PCR 5/28 >> neg Trach aspirate 6/2 >> MSSA BCx2 6/2 >> negative ...................... Tracheal aspirate 6/11 >> few GNR >> few proteus and few candida tropicalis BCx2 6/11 >> negative 10/27/2018 tracheal  aspirate>> gram-positive cocci gram-negative rods>>  Antimicrobials:  Vancomycin 6/3 > 6/4 Cefazolin 6/4 >> 6/10 ...........................Marland Kitchen Vanco 6/11 >> 6/17 Cefepime 6/11 >>6/18 Cefepime 10/27/2018>> 7/6  Interim history/subjective:  Continues to require high FiO2 overnight  Objective   Blood pressure  129/63, pulse (!) 53, temperature 97.9 F (36.6 C), temperature source Oral, resp. rate 19, height 5' (1.524 m), weight 102.4 kg, SpO2 96 %.    FiO2 (%):  [60 %] 60 %   Intake/Output Summary (Last 24 hours) at 11/09/2018 1201 Last data filed at 11/09/2018 0600 Gross per 24 hour  Intake 834.43 ml  Output -  Net 834.43 ml   Filed Weights   11/07/18 1200 11/07/18 1510 11/08/18 0624  Weight: 105.3 kg 102.5 kg 102.4 kg   General:  Chronically ill appearing female, NAD Neuro:  Alert and interactive, moving all ext to command HEENT:  [place]/AT, PERRL, EOM-I and MMM, trach in place Cardiovascular:  RRR, Nl S1/S2 and -M/R/G Lungs:  Coarse BS diffusely Abdomen:  Soft, NT, ND and +BS Musculoskeletal:  No acute deformity Skin:  Intact, MMM  I reviewed CXR myself, trach is in a good position, cuffed 6  Assessment & Plan:   Persistent resp failure now s/p tracheostomy - TC as tolerated - Active diureses today - Titrate O2 for sat of 88-92% - Trach care per protocol - May remove vent from room - Hold off PMV - If able to reduce FiO2 to 40% and remains on TC will change trach to a cuffless 4 and recheck SLP prior to commitment to PEG   HD dependent renal failure.   - Continue HD per nephrology.   Proteus colonization vs. Bronchitis - s/p 5 day course cefepime completed 7/6. -  Monitor off abx.  Persistent shock after PEA arrest- resolved  Bradycardia - off dopamine 7/8,  Intermittent Afib/ hx of Aflutter Acute on chronic diastolic HF - Per primary - Volume per iHD - Not a candidate for any EP procedures   Nutrition - Continue TF's, but NGT came out today. SLP eval for swallow. If fails will need PEG  Anemia - stable, restarting heparin today, monitor CBC/ bleeding  Goals of care. - PMT involved. FULL code.   Dysphagia: If able to reduce FiO2 to 40% and remains on TC will change trach to a cuffless 4 and recheck SLP prior to commitment to PEG  Discussed with PCCM-NP  Best  practice:  Diet: Continue tube feeds Pain/Anxiety/Delirium protocol (if indicated): NA VAP protocol (if indicated): In place  DVT prophylaxis: restarting heparin gtt 7/10 GI prophylaxis: Pepcid Glucose control: SSI Mobility: PT/ OT as tolerated Code Status: Full Disposition: ICU   Rush Farmer, M.D. [facility] Pulmonary/Critical Care Medicine. Pager: [phone]. After hours pager: [phone].

## 2018-11-09 NOTE — Progress Notes (Signed)
Repeat BS 112 post hypoglycemia protocol

## 2018-11-09 NOTE — Progress Notes (Signed)
[facility] Progress Note    Assessment/ Plan:   1.  Acute kidney injury Superimposed on CKD IV (baseline 1.7-1.9) likely ATN secondary to shock, anuric: Transitioned to iHD on 6/27. Using right [facility] and on MWF schedule.  She is on midodrine for hypotension at 20 TID and florinef 0.2 mg daily added 7/4.   No signs of recovery at present- am not hopeful that she will be liberated from dialysis. Continue supportive care with dialysis at this present time. HD today. Midodrine pre if NG back in and albumin PRN for BP support.  Will obtain vein mapping today (wasn't completed yesterday) and plan to d/w VVS re: perm access prior to discharge. She's still on heparin gtt for A fib and it would be easiest to try for surgery before she's started on longer acting anticoag.   2.  Proteus +respiratory culture: s/p cefepime, ended 7/8 per PCCM.    3.  Chronic respiratory failure: Status post tracheostomy.  Currently on vent, per pulmonary, weaning as tolerated. Now TC.  4.  Acute on chronic diastolic heart failure: Appreciate the expertise of the heart failure team.  Plans to try to correlate bradycardia with hypotension to see if could/ would be a candidate for TVP/ RV micro device--> not a candidate for these interventions per notes; not felt to be a candidate for PPM per EP team.  She is ondopamine at present but thought to be related to no midodrine for > 1day.  Per above will likely replace NGT for short term at least.   5.  Anemia due to chronic disease and acute blood loss: Continue ESA, Increase Aranesp to 200 mcg for 7/15, on a course of IV iron.  7/14 Hb 8.8, improving.   6.  Bradycardia, PEA cardiac arrest/ Afib :  As above in #4. On hep gtt as well.  7.  Dispo: remains in ICU.  Palliative care discussion noted and greatly appreciated.  Looks like a good LTAC candidate.  Subjective:    Seen in room, no complaints.  Back on dopamine - presumed due to lack of midodrine due to no  gastric access (NT tube out for several days).  RN going to call coretrak and d/w primary.  Surgery recommended NG tube placement and more time to eval swallowing.    Objective:   BP (!) 101/49   Pulse (!) 53   Temp 98.1 F (36.7 C) (Oral)   Resp 18   Ht 5' (1.524 m)   Wt 102.4 kg   SpO2 95%   BMI 44.09 kg/m   Intake/Output Summary (Last 24 hours) at 11/09/2018 0849 Last data filed at 11/09/2018 0600 Gross per 24 hour  Intake 1012.68 ml  Output -  Net 1012.68 ml   Weight change:   Physical Exam: Gen: obese, sleepy in bed, NAD HEENT: trach in place, on TC CVS: bradycardic, seems RRR Resp:dim breath sounds bilaterally Abd: obese Ext: trace LE edema ACCESS: R IJ TDC  Imaging: Ct Abdomen Wo Contrast  Result Date: 11/07/2018 CLINICAL DATA:  Evaluate gastric anatomy prior to potential percutaneous gastrostomy tube placement. EXAM: CT ABDOMEN WITHOUT CONTRAST TECHNIQUE: Multidetector CT imaging of the abdomen was performed following the standard protocol without IV contrast. COMPARISON:  Abdominal radiograph-11/06/2018; chest radiograph-10/29/2018; 10/21/2018 FINDINGS: Lower chest: Limited visualization of the lower thorax demonstrates extensive consolidative opacities within the imaged right lower lobe with associated air bronchograms. Mild interstitial thickening within the imaged lung bases as could be seen in the setting pulmonary edema.  Trace right-sided pleural effusion. Cardiomegaly. Coronary artery calcifications. There is mild diffuse decreased attenuation intra cardiac blood pool suggestive of anemia. No pericardial effusion. Hepatobiliary: Mild nodularity hepatic contour. Post cholecystectomy. No ascites. Pancreas: Pancreas appears atrophic. Spleen: Normal appearance of the spleen. Note is made of a small splenule about the splenic hilum. Adrenals/Urinary Tract: Calcifications noted about the bilateral renal hila, favored to be vascular in etiology. No definite renal stones.  There is a minimal amount of likely age and body habitus related bilateral perinephric stranding. No urinary obstruction. Note is made of an approximately 1.6 x 1.3 cm nodule within the medial limb of the left adrenal gland (image 26, series 3 which is incompletely characterize though statistically most likely to represent a benign adrenal adenoma. Normal noncontrast appearance of the right adrenal gland. The urinary bladder was not imaged. Stomach/Bowel: The transverse colon and hepatic parenchyma are interposed between the ventral wall of the upper abdomen and anterior wall of the stomach, precluding percutaneous gastrostomy tube access. Moderate colonic stool burden without evidence of enteric obstruction. No pneumoperitoneum, pneumatosis or portal venous gas. Vascular/Lymphatic: Atherosclerotic plaque within a normal caliber abdominal aorta. No bulky retroperitoneal or mesenteric adenopathy on this noncontrast examination Other: Diffuse body wall anasarca. Ill-defined subcutaneous stranding about the midline of the chest (image 1, series 3), may represent the sequela of previous chest compressions during cardiac resuscitation. Musculoskeletal: No acute or aggressive osseous abnormalities. Stigmata of DISH in the lower thoracic spine. Bilateral facet degenerative change. IMPRESSION: 1. Gastric anatomy NOT amenable to percutaneous gastrostomy tube placement secondary to interposition of the transverse colon and left lobe of the liver. If gastrostomy tube placement is still desired, surgical consultation is advised. 2. Consolidative opacities within the right lower lobe with associated air bronchograms and trace right-sided pleural effusion, potentially atelectasis, though could be seen in the setting of infection and/or aspiration. Clinical correlation is advised. 3. Cardiomegaly. Coronary artery calcifications. Aortic Atherosclerosis (ICD10-I70.0). Electronically Signed   By: Sandi Mariscal M.D.   On: 11/07/2018  16:41    Labs: BMET Recent Labs  Lab 11/03/18 0503 11/04/18 0500 11/05/18 0315 11/06/18 0419 11/07/18 0407 11/08/18 0522 11/09/18 0259  NA 134* 133* 134* 133* 133* 133* 133*  K 3.9 4.7 4.1 4.7 4.7 4.4 4.9  CL 98 97* 97* 95* 96* 96* 96*  CO2 24 22 27 25 25 24 24   GLUCOSE 154* 113* 127* 105* 120* 107* 142*  BUN 45* 71* 34* 58* 77* 43* 50*  CREATININE 3.03* 4.20* 2.98* 4.05* 5.02* 4.01* 5.00*  CALCIUM 8.1* 8.6* 8.5* 8.8* 9.0 8.8* 9.1  PHOS 3.1 4.1 3.0 4.1 5.6* 4.4 5.6*   CBC Recent Labs  Lab 11/05/18 0315 11/06/18 0419 11/07/18 0407 11/08/18 0522  WBC 5.5 5.5 4.9 4.8  HGB 8.0* 7.9* 8.0* 8.8*  HCT 27.0* 26.9* 28.0* 30.3*  MCV 99.3 97.8 100.4* 98.4  PLT 195 209 189 186    Medications:    . aspirin  150 mg Rectal Daily  . atorvastatin  40 mg Per Tube q1800  . chlorhexidine gluconate (MEDLINE KIT)  15 mL Mouth Rinse BID  . Chlorhexidine Gluconate Cloth  6 each Topical Daily  . Chlorhexidine Gluconate Cloth  6 each Topical Daily  . darbepoetin (ARANESP) injection - NON-DIALYSIS  200 mcg Subcutaneous Q Wed-1800  . feeding supplement (PRO-STAT SUGAR FREE 64)  30 mL Per Tube TID  . fludrocortisone  0.2 mg Oral Daily  . guaiFENesin  15 mL Per Tube Q6H  . insulin aspart  0-20 Units Subcutaneous Q4H  . insulin detemir  20 Units Subcutaneous QHS  . midodrine  10 mg Oral TID WC  . polyethylene glycol  17 g Oral Daily  . sodium chloride flush  10-40 mL Intracatheter Q12H    Jannifer Hick MD [facility] Pager [phone]

## 2018-11-09 NOTE — Progress Notes (Signed)
Occupational Therapy Treatment Patient Details Name: Kathleen [name] MRN: [id number] DOB: 11/04/1950 Today's Date: 11/09/2018    History of present illness Kathleen [name] is a 68 y.o. female with history of permanent AF, hypertension, super morbid obesity, DM2, dyslipidemia, obesity hypoventilation syndrome (on home O2 with no OSA by prior PSG but showed nocturnal hypoxemia), CKD stage IV (baseline creatinine about 1.9), chronic diastolic CHF.  Admit for fluid overload. Also with pulmonary HTN.  PEA arrest 5/30, transfer to the ICU, extubater 6/3.  Intubated 10/06/18.  Trach on 10/14/18.     OT comments  Patient in bed and agreeable to OT.  Transitioned to EOB with mod assist +2 for safety, sustained sitting balance at EOB with 0-2 hand support with min guard to close supervision (preference to 1 UE support noted) for approx 15 minutes. Pt declined further mobility today, returned to supine with NT assist as ortho tech coming to don unna boots.  Will follow. Plan for next session, education on AE for LB self care.    Follow Up Recommendations  LTACH;Supervision/Assistance - 24 hour    Equipment Recommendations  3 in 1 bedside commode    Recommendations for Other Services      Precautions / Restrictions Precautions Precautions: Fall Precaution Comments: pt on trach collar  Restrictions Weight Bearing Restrictions: No       Mobility Bed Mobility Overal bed mobility: Needs Assistance Bed Mobility: Supine to Sit     Supine to sit: +2 for physical assistance;Mod assist     General bed mobility comments: pt able to initate movement of B LEs towards EOB but requires mod assist +2 for safety/support to ascend trunk   Transfers                 General transfer comment: pt declined today, ortho tech coming to apply unna boots and therefore deferred chair     Balance Overall balance assessment: Needs assistance Sitting-balance support: Bilateral upper extremity supported;Feet  supported Sitting balance-Leahy Scale: Fair Sitting balance - Comments: min guard to supervision sitting EOB, preference to UE support but able to progress to 0 hand support                                    ADL either performed or assessed with clinical judgement   ADL Overall ADL's : Needs assistance/impaired Eating/Feeding: NPO   Grooming: Minimal assistance;Sitting Grooming Details (indicate cue type and reason): pt able to wash face and hands with setup, nurse tech assisting with combing hair (to remove knots) while seated EOB              Lower Body Dressing: Maximal assistance;Sitting/lateral leans Lower Body Dressing Details (indicate cue type and reason): worked on donning/doffing socks, max assist to initate limited by decreased fucntional reach and will benefit from further AE education    Toilet Transfer Details (indicate cue type and reason): declined standing today or transfer to chair          Functional mobility during ADLs: Moderate assistance;+2 for physical assistance;+2 for safety/equipment(limited to EOB only) General ADL Comments: pt limited by decreased strength/endurance, body habitus      Vision       Perception     Praxis      Cognition Arousal/Alertness: Awake/alert Behavior During Therapy: WFL for tasks assessed/performed Overall Cognitive Status: Difficult to assess  General Comments: pt following commands         Exercises Exercises: Other exercises Other Exercises Other Exercises: seated EOB engaged in B UE strength/endurance exercises (unilaterally): x 5 reps 3 sets of: overhead press, elbow flex/extension, and gross hand open/close    Shoulder Instructions       General Comments on trach collar at 10L with 60% FiO2, VSS    Pertinent Vitals/ Pain       Pain Assessment: No/denies pain  Home Living                                          Prior  Functioning/Environment              Frequency  Min 2X/week        Progress Toward Goals  OT Goals(current goals can now be found in the care plan section)  Progress towards OT goals: Progressing toward goals  Acute Rehab OT Goals Patient Stated Goal: to go home OT Goal Formulation: With patient  Plan Discharge plan remains appropriate;Frequency remains appropriate    Co-evaluation                 AM-PAC OT "6 Clicks" Daily Activity     Outcome Measure   Help from another person eating meals?: Total Help from another person taking care of personal grooming?: A Little Help from another person toileting, which includes using toliet, bedpan, or urinal?: Total Help from another person bathing (including washing, rinsing, drying)?: A Lot Help from another person to put on and taking off regular upper body clothing?: A Lot Help from another person to put on and taking off regular lower body clothing?: A Lot 6 Click Score: 11    End of Session Equipment Utilized During Treatment: Oxygen(via trach collar)  OT Visit Diagnosis: Muscle weakness (generalized) (M62.81);Unsteadiness on feet (R26.81);Other symptoms and signs involving cognitive function   Activity Tolerance Patient tolerated treatment well   Patient Left in bed;with call bell/phone within reach;with nursing/sitter in room   Nurse Communication Mobility status        Time: 1638-4665 OT Time Calculation (min): 22 min  Charges: OT General Charges $OT Visit: 1 Visit OT Treatments $Self Care/Home Management : 8-22 mins  [name], [place] Pager [phone] Office [phone]    [name] 11/09/2018, 12:08 PM

## 2018-11-09 NOTE — Progress Notes (Signed)
Patient complaining that legs are tight. Legs wrapped previously by   Ortho, called them, they will come evaluate . Discussed patient with speech, attending had mentioned another eval.  DCCM note revealed that if patient can stay on 40%overnight he will change her trach tomorrow to a 4 cuffless and then do another swallow speech evaluation. Dr Nelda Marseille reiterated this verbally to nursing. If fails swallow will then proceed to PEG by surgery.

## 2018-11-09 NOTE — Progress Notes (Signed)
Nutrition Follow-up  DOCUMENTATION CODES:   Morbid obesity  INTERVENTION:   -NGT to be replaced Friday with Cortrak for comfort  Continue tube feeding:  -Osmolite 1.5 @ 45 ml/hr (1080 ml) via NGT -30 ml Prostat TID  Provides:1920kcal,113grams protein,848ml free water. Meets 100% ofkcal needs and 100% of protein needs.  NUTRITION DIAGNOSIS:   Inadequate oral intake related to inability to eat as evidenced by NPO status.  Ongoing  GOAL:   Provide needs based on ASPEN/SCCM guidelines  Addressed via TF  MONITOR:   Vent status, Labs, Weight trends, I & O's, TF tolerance  REASON FOR ASSESSMENT:   Consult Enteral/tube feeding initiation and management  ASSESSMENT:   68 year old with morbid obesity, acute on chronic diastolic heart failure, pulmonary hypertension transferred to the ICU after PEA arrest.  Suspect respiratory failure due to pulmonary edema   5/30 - s/p PEA arrest, intubated and transferred to ICU,CRRTinitiated 6/2- failed SBT, pulm edema 6/3 extubated, refused BiPAP 6/10- CRRT stopped  6/11- re-intubated, CRRT re-started 6/19- failed SBT, s/p trach  6/27- transition iHD 7/13- Cortrak pulled out 7/15- NGT placed   Pt discussed during ICU rounds and with RN.  Unable to have PEG placed and will likely require open G-tube placement  per surgery. There is concern pt may have respiratory issues post procedure. Plan to reassess FEEs before proceeding with placement.  Continue tube feeding via NGT for now. RD to re-place NGT for Cortrak on Friday when service is offered.   Per nephrology, pt likely will be unable to be liberated from HD. Plan for treatment today.   Admission weight: 134.8 kg Current weight102.4kg (downfrom105.3kg on 7/11)  I/O: +3,209 ml since 7/1 Last HD 7/13: 2,800 ml net UF  Medications: aranesp, 40 mg lasix, SS novolog, levemir, miralax Labs: Na 133 (L) Cr 5- trending up, Phosphorus 5.6 (wdl of HD) corrected calcium  10.1 (wdl) CBG 61-136  Diet Order:   Diet Order            Diet NPO time specified  Diet effective now              EDUCATION NEEDS:   No education needs have been identified at this time  Skin:  Skin Assessment: Skin Integrity Issues: Skin Integrity Issues:: Incisions, Stage II Stage II: R coccyx, vertebral column Incisions: R neck/chest Other: MASD- bilateral breast  Last BM:  7/14  Height:   Ht Readings from Last 1 Encounters:  10/05/18 5' (1.524 m)    Weight:   Wt Readings from Last 1 Encounters:  11/08/18 102.4 kg    Ideal Body Weight:  45.5 kg  BMI:  Body mass index is 44.09 kg/m.  Estimated Nutritional Needs:   Kcal:  1800-2000 kcal  Protein:  100-120 grams  Fluid:  >/= 1.7 L/day   Mariana Single RD, LDN Clinical Nutrition Pager # - [phone]

## 2018-11-09 NOTE — Progress Notes (Signed)
Orthopedic Tech Progress Note Patient Details:  Kathleen [name] Dec 15, 1950 [id number]  Ortho Devices Type of Ortho Device: Haematologist Ortho Device/Splint Location: bilateral Ortho Device/Splint Interventions: Application, Ordered, Adjustment   Post Interventions Patient Tolerated: Well Instructions Provided: Care of device, Adjustment of device   Janit Pagan 11/09/2018, 10:47 AM

## 2018-11-09 NOTE — Progress Notes (Signed)
ANTICOAGULATION CONSULT NOTE - Follow Up Consult  Pharmacy Consult for heparin Indication: atrial fibrillation  Allergies  Allergen Reactions  . Doxycycline     REACTION: Wheals and pruritus    Patient Measurements: Height: 5' (152.4 cm) Weight: 225 lb 12 oz (102.4 kg) IBW/kg (Calculated) : 45.5 Heparin Dosing Weight: 72 kg  Vital Signs: Temp: 98.3 F (36.8 C) (07/15 0300) Temp Source: Oral (07/15 0300) BP: 120/99 (07/15 0430) Pulse Rate: 55 (07/15 0430)  Labs: Recent Labs    11/07/18 0407 11/08/18 0522 11/09/18 0259  HGB 8.0* 8.8*  --   HCT 28.0* 30.3*  --   PLT 189 186  --   HEPARINUNFRC 0.43 0.41 0.24*  CREATININE 5.02* 4.01* 5.00*    Estimated Creatinine Clearance: 11.8 mL/min (A) (by C-G formula based on SCr of 5 mg/dL (H)).   Assessment: 82 yoF with complicated admission on Eliquis at home for hx of AFib, transitioned to heparin on admission which has been held since 6/29 due to bleeding and bruising at trach site. Palliative care was consulted and it was unclear whether pt would be able to wean off dopamine support, so heparin continued to be held. Pt now tolerating iHD without pressors  Heparin level 0.24 this am  Goal of Therapy:  Heparin level 0.3-0.7 units/ml Monitor platelets by anticoagulation protocol: Yes   Plan:  Increase heparin to 1600 units/hr Daily heparin level and cbc Plan to transition back to apixaban if/once PEG placed  Excell Seltzer, PharmD Clinical Pharmacist 11/09/2018 5:16 AM  Please check [email or web address] for unit-specific pharmacist phone numbers

## 2018-11-09 NOTE — TOC Progression Note (Addendum)
Transition of Care [facility]) - Progression Note    Patient Details  Name: Kathleen [name] MRN: [id number] Date of Birth: 04/30/1950  Transition of Care [facility]) CM/SW [place] RN, BSN, NCM-BC, [place] [phone] Phone Number: 11/09/2018, 11:59 AM  Clinical Narrative:    CM continue to follow for transitional needs. CM updated the patients family on 7/10 on LTACH preference and process, with the patients family, Luiz Ochoa (son) and [name] (daughter-in-law) on board with the referral to [facility]. Raquel Sarna, [facility] liaison, has been discussing the [facility] placement process with the family, as requested by the patient. Insurance Josem Kaufmann is still pending. CM team will continue to follow.    ADDENDUM: 11/10/18 @ 0940-Ma Munoz RNCM-CM spoke to the patients family late yesterday afternoon, with an update on the LTACH status provided, with the family verbalizing understanding. Josem Kaufmann is still pending at this time. CM team will continue to follow.   Expected Discharge Plan: Long Term Acute Care (LTAC) Barriers to Discharge: Continued Medical Work up, Orthoptist and Services Expected Discharge Plan: Long Term Acute Care (LTAC) In-house Referral: Hospice / Palliative Care Discharge Planning Services: CM Consult Post Acute Care Choice: Long Term Acute Care (LTAC) Living arrangements for the past 2 months: Single Family Home                 DME Arranged: N/A DME Agency: NA       HH Arranged: NA HH Agency: NA         Social Determinants of Health (SDOH) Interventions    Readmission Risk Interventions Readmission Risk Prevention Plan 11/04/2018 10/18/2018  Transportation Screening - Not Complete  Transportation Screening Comment LTACH pending insurance authorization Continued medical work-up; CIR pending  Medication Review Press photographer) - Complete  PCP or Specialist appointment within 3-5 days of discharge - Not Complete  PCP/Specialist Appt Not  Complete comments LTACH pending insurance authorization Continued medical work-up; CIR pending  HRI or [place] - Complete  SW Recovery Care/Counseling Consult - Complete  Palliative Care Screening Complete Not [place] - Not Applicable  Some recent data might be hidden

## 2018-11-09 NOTE — Plan of Care (Signed)
On 40% fio2 coughing up yellow secretions via trach.  Suctioning trach periodically, frequent trach care. Dialysis today. Una boots replaced today.  If patient stays on 40% TC MD states he will downsize shiley to cuffless 4 and speech can try again to do swallow evaluation.  If doe snot pass, then will have surgery do PEG awaiting insurance verification for [facility].  Fluid volume status equalibration [potential for impaired Gas exchange r/t secretions

## 2018-11-09 NOTE — Progress Notes (Signed)
SLP Cancellation Note  Patient Details Name: Kathleen [name] MRN: [id number] DOB: [birth year]   Cancelled treatment:       Reason Eval/Treat Not Completed: Medical issues which prohibited therapy. Per MD note, pt continues to have high FiO2 requirements and to hold off on PMV. However, looks like he will be considering trach change once these needs decrease. Will continue to follow.   [name] Nelli Swalley 11/09/2018, 1:20 PM  Pollyann Glen, M.A. [place] Acute Environmental education officer [phone] Office [phone]

## 2018-11-09 NOTE — Progress Notes (Signed)
Orthopedic Tech Progress Note Patient Details:  Kathleen [name] Feb 14, 1951 [id number] Patient was saying boots was to tight. So RN asked for me to come and remove them Patient ID: Kathleen [name], female   DOB: Nov 07, 1950, 68 y.o.   MRN: [id number]   Janit Pagan 11/09/2018, 4:31 PM

## 2018-11-09 NOTE — Progress Notes (Signed)
Assisted tele visit to patient with family member.  Starletta Houchin Anderson, RN   

## 2018-11-10 ENCOUNTER — Inpatient Hospital Stay (HOSPITAL_COMMUNITY): Payer: Medicare HMO

## 2018-11-10 DIAGNOSIS — R0902 Hypoxemia: Secondary | ICD-10-CM

## 2018-11-10 DIAGNOSIS — N186 End stage renal disease: Secondary | ICD-10-CM

## 2018-11-10 LAB — RENAL FUNCTION PANEL
Albumin: 2.4 g/dL — ABNORMAL LOW (ref 3.5–5.0)
Anion gap: 12 (ref 5–15)
BUN: 31 mg/dL — ABNORMAL HIGH (ref 8–23)
CO2: 26 mmol/L (ref 22–32)
Calcium: 8.3 mg/dL — ABNORMAL LOW (ref 8.9–10.3)
Chloride: 95 mmol/L — ABNORMAL LOW (ref 98–111)
Creatinine, Ser: 3.34 mg/dL — ABNORMAL HIGH (ref 0.44–1.00)
GFR calc Af Amer: 16 mL/min — ABNORMAL LOW (ref >60)
GFR calc non Af Amer: 14 mL/min — ABNORMAL LOW (ref >60)
Glucose, Bld: 189 mg/dL — ABNORMAL HIGH (ref 70–99)
Phosphorus: 3.3 mg/dL (ref 2.5–4.6)
Potassium: 3.9 mmol/L (ref 3.5–5.1)
Sodium: 133 mmol/L — ABNORMAL LOW (ref 135–145)

## 2018-11-10 LAB — MAGNESIUM: Magnesium: 2.2 mg/dL (ref 1.7–2.4)

## 2018-11-10 LAB — CBC
HCT: 29.2 % — ABNORMAL LOW (ref 36.0–46.0)
Hemoglobin: 8.6 g/dL — ABNORMAL LOW (ref 12.0–15.0)
MCH: 29.3 pg (ref 26.0–34.0)
MCHC: 29.5 g/dL — ABNORMAL LOW (ref 30.0–36.0)
MCV: 99.3 fL (ref 80.0–100.0)
Platelets: 158 10*3/uL (ref 150–400)
RBC: 2.94 MIL/uL — ABNORMAL LOW (ref 3.87–5.11)
WBC: 5.1 10*3/uL (ref 4.0–10.5)
nRBC: 0 % (ref 0.0–0.2)

## 2018-11-10 LAB — GLUCOSE, CAPILLARY
Glucose-Capillary: 166 mg/dL — ABNORMAL HIGH (ref 70–99)
Glucose-Capillary: 146 mg/dL — ABNORMAL HIGH (ref 70–99)
Glucose-Capillary: 87 mg/dL (ref 70–99)
Glucose-Capillary: 92 mg/dL (ref 70–99)
Glucose-Capillary: 88 mg/dL (ref 70–99)
Glucose-Capillary: 113 mg/dL — ABNORMAL HIGH (ref 70–99)

## 2018-11-10 LAB — HEPARIN LEVEL (UNFRACTIONATED): Heparin Unfractionated: 0.32 IU/mL (ref 0.30–0.70)

## 2018-11-10 NOTE — Addendum Note (Signed)
Addended by: Hulan Fray on: 11/10/2018 04:51 PM   Modules accepted: Orders

## 2018-11-10 NOTE — Care Management (Signed)
Expedited Appeal Request form completed by Dr. Cathlean Sauer and faxed to [facility] Golden liaison, as requested.  Midge Minium RN, BSN, NCM-BC, ACM-RN [phone]

## 2018-11-10 NOTE — Progress Notes (Signed)
PROGRESS NOTE    Kathleen [name]  JPV:[id number] DOB: [birth year] DOA: 09/22/2018 PCP: Aldine Contes, MD    Brief Narrative:  68 year old female presented to the outpatient cardiology clinic for follow-up. She does have significant past medical history for permanent atrial fibrillation, hypertension, morbid obesity, type 2 diabetes mellitus, dyslipidemia, obesity hypoventilation, chronic kidney disease stage V, anddiastolic heart failure. She reported orthopnea, increased dyspnea on exertion, and increased lower extremity edema. Patient failed for outpatient diuretic management, and she was directly admitted to the hospital for further evaluation.  She had a prolonged hospitalization. Patient was initially placed on milrinone and furosemide. May 30 she suffered from a cardiac arrest, pulseless electrical activity. She recovered spontaneous circulation after 8 minutes of CPR. She was intubated and supported with mechanical ventilation.  She had worsening kidney function required CRRT May 31. She was extubated June 3, and reintubated June 11 for hypercapnic respiratory failure. She remainedventilator dependent respiratory failureand on June 19 underwent tracheostomy.  She required vasopressors and inotropes for hemodynamic decompensation.  She was transferred to [facility] on July 12.  11/10/18 Trach changed to cuffless #4, patient has been of dopamine for the last 24 H.    Assessment & Plan:   Active Problems:   Type 2 diabetes mellitus with other specified complication (HCC)   Dyslipidemia   OBESITY   Acute on chronic diastolic CHF (congestive heart failure) (HCC)   Acute kidney injury superimposed on chronic kidney disease (HCC)   Acute respiratory failure with hypoxia (HCC)   VAP (ventilator-associated pneumonia) (HCC)   Shock ([place])   Acute respiratory failure (HCC)   CHF (congestive heart failure) ([place])   Palliative care encounter   Dysphagia   Tracheostomy status  ([place])   1.Acute respiratory failure, hypoxic andhypercapnic.  off mechanical ventilation since June 11. Patient tolerating well trach collar no with Fi02 down to  30% with oxygen saturation 93 to 91%. Her trach has been exchange to cuffless. Continue speech evaluation.   2. Diastolic heart failure with hypotension. No patient is of dopamine, will continue midodrine and fludrocortisone.   3. Stage V CKD due to ATN, related to shock.  Transitioned to regular HD on June 27, getting HD M-W-F. Clinically euvolemic, will continue to follow HD recommendations per nephrology. Her K is 3,9 and serum bicarbonate is 26.   4. Swallow dysfunction. change trach to cuffless, will continue with aggressive speech therapy, if continue to fail will need gastric tube, surgery has been consulted.  5. Chronic atrial fibrillation. Continue with IV heparin for now for anticoagulation, may start on apixaban when enteral acccess in place.   6. T2DM. Her glucose this am at 189. Continue glucose cover and monitoring, with insulin sliding scale, continue tube feedings as tolerated.   7. Chronic anemia with iron deficiency. Cell count stable with Hgb at 8,6 and Hct at 29,2. .  8. Morbid obesity with dysplidemia. BMI 44. On atorvastatin.   9. Ventilator associated pneumonia (not present on admission). Proteus. Completed antibiotic therapy on 07/08. Completed antibiotic therapy, continue trach care. Patient will benefit from [facility] to continue respiratory care.  DVT prophylaxis:scd Code Status:full Family Communication:No family at the bedside Disposition Plan/ discharge barriers:Possible LTAC   Body mass index is 42.75 kg/m. Malnutrition Type:  Nutrition Problem: Inadequate oral intake Etiology: inability to eat   Malnutrition Characteristics:  Signs/Symptoms: NPO status   Nutrition Interventions:  Interventions: Tube feeding, Prostat  RN Pressure Injury Documentation:     Consultants:   Pulmonary  Nephrology   Surgery   IR   Procedures:   Trach   Antimicrobials:       Subjective: Patient is feeling better, tolerating feedings per ng, trach has been changed to cuffless. No chest pain or dyspnea, no nausea or vomiting.   Objective: Vitals:   11/10/18 0800 11/10/18 0818 11/10/18 0900 11/10/18 0930  BP: (!) 102/41 (!) 102/41 (!) 91/32   Pulse: 61 (!) 59 62 (!) 57  Resp: 18 20 12  (!) 22  Temp:      TempSrc:      SpO2: (!) 87% 92% 93% 96%  Weight:      Height:        Intake/Output Summary (Last 24 hours) at 11/10/2018 1130 Last data filed at 11/10/2018 0800 Gross per 24 hour  Intake 2383.77 ml  Output 2900 ml  Net -516.23 ml   Filed Weights   11/08/18 0624 11/09/18 1700 11/09/18 2009  Weight: 102.4 kg 102 kg 99.3 kg    Examination:   General: deconditioned and ill looking appearing  Neurology: Awake and alert, non focal  E ENT: mild pallor, no icterus, oral mucosa moist/ trach in place.  Cardiovascular: No JVD. S1-S2 present, rhythmic, no gallops, rubs, or murmurs. No lower extremity edema. Pulmonary: positive breath sounds bilaterally, adequate air movement, no wheezing, rhonchi or rales. Gastrointestinal. Abdomen protuberant with no organomegaly, non tender, no rebound or guarding Skin. No rashes Musculoskeletal: no joint deformities     Data Reviewed: I have personally reviewed following labs and imaging studies  CBC: Recent Labs  Lab 11/06/18 0419 11/07/18 0407 11/08/18 0522 11/09/18 1727 11/10/18 0502  WBC 5.5 4.9 4.8 5.6 5.1  HGB 7.9* 8.0* 8.8* 8.6* 8.6*  HCT 26.9* 28.0* 30.3* 29.2* 29.2*  MCV 97.8 100.4* 98.4 97.0 99.3  PLT 209 189 186 158 147   Basic Metabolic Panel: Recent Labs  Lab 11/06/18 0419 11/07/18 0407 11/08/18 0522 11/09/18 0259 11/10/18 0502  NA 133* 133* 133* 133* 133*  K 4.7 4.7 4.4 4.9 3.9  CL 95* 96* 96* 96* 95*  CO2 25 25 24 24 26   GLUCOSE 105* 120* 107* 142* 189*  BUN 58*  77* 43* 50* 31*  CREATININE 4.05* 5.02* 4.01* 5.00* 3.34*  CALCIUM 8.8* 9.0 8.8* 9.1 8.3*  MG  --   --   --   --  2.2  PHOS 4.1 5.6* 4.4 5.6* 3.3   GFR: Estimated Creatinine Clearance: 17.3 mL/min (A) (by C-G formula based on SCr of 3.34 mg/dL (H)). Liver Function Tests: Recent Labs  Lab 11/06/18 0419 11/07/18 0407 11/08/18 0522 11/09/18 0259 11/10/18 0502  ALBUMIN 2.3* 2.3* 2.8* 2.7* 2.4*   No results for input(s): LIPASE, AMYLASE in the last 168 hours. No results for input(s): AMMONIA in the last 168 hours. Coagulation Profile: No results for input(s): INR, PROTIME in the last 168 hours. Cardiac Enzymes: No results for input(s): CKTOTAL, CKMB, CKMBINDEX, TROPONINI in the last 168 hours. BNP (last 3 results) No results for input(s): PROBNP in the last 8760 hours. HbA1C: No results for input(s): HGBA1C in the last 72 hours. CBG: Recent Labs  Lab 11/09/18 1539 11/09/18 2001 11/09/18 2346 11/10/18 0419 11/10/18 0749  GLUCAP 156* 122* 160* 166* 146*   Lipid Profile: No results for input(s): CHOL, HDL, LDLCALC, TRIG, CHOLHDL, LDLDIRECT in the last 72 hours. Thyroid Function Tests: No results for input(s): TSH, T4TOTAL, FREET4, T3FREE, THYROIDAB in the last 72 hours. Anemia Panel: No results for input(s): VITAMINB12, FOLATE, FERRITIN, TIBC, IRON,  RETICCTPCT in the last 72 hours.    Radiology Studies: I have reviewed all of the imaging during this hospital visit personally     Scheduled Meds: . aspirin  81 mg Oral Daily  . atorvastatin  40 mg Per Tube q1800  . chlorhexidine gluconate (MEDLINE KIT)  15 mL Mouth Rinse BID  . Chlorhexidine Gluconate Cloth  6 each Topical Daily  . Chlorhexidine Gluconate Cloth  6 each Topical Daily  . darbepoetin (ARANESP) injection - NON-DIALYSIS  200 mcg Subcutaneous Q Wed-1800  . famotidine  20 mg Per Tube Daily  . feeding supplement (PRO-STAT SUGAR FREE 64)  30 mL Per Tube TID  . fludrocortisone  0.2 mg Oral Daily  .  guaiFENesin  15 mL Per Tube Q6H  . insulin aspart  0-20 Units Subcutaneous Q4H  . insulin detemir  10 Units Subcutaneous QHS  . midodrine  10 mg Oral TID WC  . polyethylene glycol  17 g Oral Daily  . sodium chloride flush  10-40 mL Intracatheter Q12H   Continuous Infusions: . sodium chloride 250 mL (10/31/18 2358)  . sodium chloride    . sodium chloride    . albumin human 25 g (11/07/18 1315)  . DOPamine 3 mcg/kg/min (11/10/18 0600)  . feeding supplement (OSMOLITE 1.5 CAL) 45 mL/hr at 11/10/18 0000  . heparin 1,600 Units/hr (11/10/18 0800)     LOS: 49 days        Omolara Carol Gerome Apley, MD

## 2018-11-10 NOTE — Progress Notes (Signed)
[facility] Progress Note    Assessment/ Plan:   1.  Acute kidney injury Superimposed on CKD IV (baseline 1.7-1.9) likely ATN secondary to shock, anuric: Transitioned to iHD on 6/27. Using right [facility] and on MWF schedule.  She is on midodrine for hypotension at 20 TID and florinef 0.2 mg daily added 7/4.   No signs of recovery at present- am not hopeful that she will be liberated from dialysis. Continue supportive care with dialysis at this present time. HD MWF. Will obtain vein mapping today (wasn't completed yesterday) and plan to d/w VVS re: perm access prior to discharge. She's still on heparin gtt for A fib and it would be easiest to try for surgery before she's started on longer acting anticoag.   2.  Proteus +respiratory culture: s/p cefepime, ended 7/8 per PCCM.    3.  Chronic respiratory failure: Status post tracheostomy.  Currently on vent, per pulmonary, weaning as tolerated. Now TC.  4.  Acute on chronic diastolic heart failure: Appreciate the expertise of the heart failure team.  Plans to try to correlate bradycardia with hypotension to see if could/ would be a candidate for TVP/ RV micro device--> not a candidate for these interventions per notes; not felt to be a candidate for PPM per EP team.  She is ondopamine at present but thought to be related to no midodrine for > 1day.  Per above will likely replace NGT for short term at least.   5.  Anemia due to chronic disease and acute blood loss: Continue ESA, Increase Aranesp to 200 mcg for 7/15, on a course of IV iron.  Hb stable in mid 8s.   6.  Bradycardia, PEA cardiac arrest/ Afib :  As above in #4. On hep gtt as well.  7.  Dispo: remains in ICU.  Palliative care discussion noted and greatly appreciated.  Looks like a good LTAC candidate.  Subjective:    Seen in room, no complaints.  Coretrak back in now and can receive midodrine - DA still on at 3. Tol 2.7L UF yesterday.  Vein mapping still pending.     Objective:   BP (!) 114/37   Pulse (!) 54   Temp 98 F (36.7 C) (Oral)   Resp (!) 21   Ht 5' (1.524 m)   Wt 99.3 kg   SpO2 94%   BMI 42.75 kg/m   Intake/Output Summary (Last 24 hours) at 11/10/2018 0758 Last data filed at 11/10/2018 0600 Gross per 24 hour  Intake 2231.93 ml  Output 2900 ml  Net -668.07 ml   Weight change:   Physical Exam: Gen: obese, sleepy in bed, NAD HEENT: trach in place, on TC CVS: bradycardic, seems RRR Resp:dim breath sounds bilaterally Abd: obese Ext: trace LE edema ACCESS: R IJ TDC  Imaging: Dg Abd 1 View  Result Date: 11/09/2018 CLINICAL DATA:  NG placement EXAM: ABDOMEN - 1 VIEW COMPARISON:  11/06/2018 FINDINGS: NG tube in the stomach with the tip near the pylorus. Stomach decompressed. Small bowel decompressed. Mildly distended colon unchanged. IMPRESSION: NG tube in the stomach.  Nonobstructive bowel gas pattern unchanged. Electronically Signed   By: Franchot Gallo M.D.   On: 11/09/2018 12:09    Labs: BMET Recent Labs  Lab 11/04/18 0500 11/05/18 0315 11/06/18 0419 11/07/18 0407 11/08/18 0522 11/09/18 0259 11/10/18 0502  NA 133* 134* 133* 133* 133* 133* 133*  K 4.7 4.1 4.7 4.7 4.4 4.9 3.9  CL 97* 97* 95* 96* 96* 96* 95*  CO2 _0 GLUCOSE 113* 127* 105* 120* 107* 142* 189*  BUN 71* 34* 58* 77* 43* 50* 31*  CREATININE 4.20* 2.98* 4.05* 5.02* 4.01* 5.00* 3.34*  CALCIUM 8.6* 8.5* 8.8* 9.0 8.8* 9.1 8.3*  PHOS 4.1 3.0 4.1 5.6* 4.4 5.6* 3.3   CBC Recent Labs  Lab 11/07/18 0407 11/08/18 0522 11/09/18 1727 11/10/18 0502  WBC 4.9 4.8 5.6 5.1  HGB 8.0* 8.8* 8.6* 8.6*  HCT 28.0* 30.3* 29.2* 29.2*  MCV 100.4* 98.4 97.0 99.3  PLT 189 186 158 158    Medications:    . aspirin  81 mg Oral Daily  . atorvastatin  40 mg Per Tube q1800  . chlorhexidine gluconate (MEDLINE KIT)  15 mL Mouth Rinse BID  . Chlorhexidine Gluconate Cloth  6 each Topical Daily  . Chlorhexidine Gluconate Cloth  6 each Topical Daily  .  darbepoetin (ARANESP) injection - NON-DIALYSIS  200 mcg Subcutaneous Q Wed-1800  . famotidine  20 mg Per Tube Daily  . feeding supplement (PRO-STAT SUGAR FREE 64)  30 mL Per Tube TID  . fludrocortisone  0.2 mg Oral Daily  . guaiFENesin  15 mL Per Tube Q6H  . insulin aspart  0-20 Units Subcutaneous Q4H  . insulin detemir  10 Units Subcutaneous QHS  . midodrine  10 mg Oral TID WC  . polyethylene glycol  17 g Oral Daily  . sodium chloride flush  10-40 mL Intracatheter Q12H    Jannifer Hick MD [facility] Pager [phone]

## 2018-11-10 NOTE — Progress Notes (Signed)
Physical Therapy Treatment Patient Details Name: Kathleen [name] MRN: [id number] DOB: 13-Mar-1951 Today's Date: 11/10/2018    History of Present Illness Kathleen [name] is a 68 y.o. female with history of permanent AF, hypertension, super morbid obesity, DM2, dyslipidemia, obesity hypoventilation syndrome (on home O2 with no OSA by prior PSG but showed nocturnal hypoxemia), CKD stage IV (baseline creatinine about 1.9), chronic diastolic CHF.  Admit for fluid overload. Also with pulmonary HTN.  PEA arrest 5/30, transfer to the ICU, extubater 6/3.  Intubated 10/06/18.  Trach on 10/14/18.      PT Comments    Patient limited with activity this session due to nausea and some anxiety.  Initially refused up to chair stating chair hurts her bottom, but encouraged to try with padding in chair, then once EOB c/o feeling nervous and nauseated.  Stood twice only for changing linens on bed and refused up to chair today.  Feel she can continue to benefit from skilled PT to progress mobility as tolerated with encouragement.     Follow Up Recommendations  LTACH;Supervision/Assistance - 24 hour     Equipment Recommendations  None recommended by PT;Other (comment)    Recommendations for Other Services       Precautions / Restrictions Precautions Precautions: Fall Precaution Comments: pt on trach collar     Mobility  Bed Mobility Overal bed mobility: Needs Assistance Bed Mobility: Supine to Sit;Sit to Supine     Supine to sit: Mod assist Sit to supine: +2 for physical assistance;Mod assist   General bed mobility comments: patient able to initiate, but needs assist to lift trunk and increased time.  to supine needs assist for trunk and legs.  Transfers Overall transfer level: Needs assistance   Transfers: Sit to/from Stand Sit to Stand: Mod assist;+2 physical assistance         General transfer comment: sit<>stand x 2 to [facility] with max encouragement, refused OOB to chair due to nausea and chair  is uncomfortable on her bottom (had padded with pillows)  Ambulation/Gait                 Stairs             Wheelchair Mobility    Modified Rankin (Stroke Patients Only)       Balance Overall balance assessment: Needs assistance Sitting-balance support: Feet unsupported Sitting balance-Leahy Scale: Fair Sitting balance - Comments: feet don't reach the ground on ICU bed so sat with feet unsupported, but able to sit without UE support   Standing balance support: Single extremity supported Standing balance-Leahy Scale: Poor Standing balance comment: standing at [facility] about 20 seconds max this session                            Cognition Arousal/Alertness: Awake/alert Behavior During Therapy: Anxious Overall Cognitive Status: Difficult to assess                                 General Comments: communicating initially with PMSV, but then with fatigue so removed, but pt able to mouth questions/needs      Exercises General Exercises - Lower Extremity Ankle Circles/Pumps: AROM;Both;10 reps;Supine(with passive stretch at end range for increasing DF) Heel Slides: AROM;Both;Supine;5 reps    General Comments        Pertinent Vitals/Pain Pain Assessment: Faces Faces Pain Scale: Hurts little more Pain Location: abdomen Pain  Descriptors / Indicators: Other (Comment)(nausea) Pain Intervention(s): Limited activity within patient's tolerance;Monitored during session;Repositioned    Home Living                      Prior Function            PT Goals (current goals can now be found in the care plan section) Progress towards PT goals: Progressing toward goals(slowly)    Frequency    Min 3X/week      PT Plan      Co-evaluation              AM-PAC PT "6 Clicks" Mobility   Outcome Measure  Help needed turning from your back to your side while in a flat bed without using bedrails?: A Lot Help needed moving from  lying on your back to sitting on the side of a flat bed without using bedrails?: A Lot Help needed moving to and from a bed to a chair (including a wheelchair)?: Total Help needed standing up from a chair using your arms (e.g., wheelchair or bedside chair)?: A Lot Help needed to walk in hospital room?: Total Help needed climbing 3-5 steps with a railing? : Total 6 Click Score: 9    End of Session Equipment Utilized During Treatment: Oxygen Activity Tolerance: Patient limited by fatigue Patient left: in bed;with call bell/phone within reach   PT Visit Diagnosis: Other abnormalities of gait and mobility (R26.89);Muscle weakness (generalized) (M62.81)     Time: 5189-8421 PT Time Calculation (min) (ACUTE ONLY): 33 min  Charges:  $Therapeutic Activity: 23-37 mins                     Magda Kiel, [place] [facility] [phone] 11/10/2018    Reginia Naas 11/10/2018, 12:53 PM

## 2018-11-10 NOTE — Progress Notes (Signed)
ANTICOAGULATION CONSULT NOTE - Follow Up Consult  Pharmacy Consult for heparin Indication: atrial fibrillation  Allergies  Allergen Reactions  . Doxycycline     REACTION: Wheals and pruritus    Patient Measurements: Height: 5' (152.4 cm) Weight: 218 lb 14.7 oz (99.3 kg) IBW/kg (Calculated) : 45.5 Heparin Dosing Weight: 72 kg  Vital Signs: Temp: 98 F (36.7 C) (07/16 0400) Temp Source: Oral (07/16 0400) BP: 114/37 (07/16 0700) Pulse Rate: 54 (07/16 0700)  Labs: Recent Labs    11/08/18 0522 11/09/18 0259 11/09/18 1727 11/10/18 0502  HGB 8.8*  --  8.6* 8.6*  HCT 30.3*  --  29.2* 29.2*  PLT 186  --  158 158  HEPARINUNFRC 0.41 0.24*  --  0.32  CREATININE 4.01* 5.00*  --  3.34*    Estimated Creatinine Clearance: 17.3 mL/min (A) (by C-G formula based on SCr of 3.34 mg/dL (H)).   Assessment: 70 yoF with complicated admission on Eliquis at home for hx of AFib, transitioned to heparin on admission which has been held since 6/29 due to bleeding and bruising at trach site. Palliative care was consulted and it was unclear whether pt would be able to wean off dopamine support, so heparin continued to be held. Pt now tolerating iHD without pressors.  Heparin level came back therapeutic at 0.32, on 1600 units/hr. Hgb stable at 8.6, plt 158. No s/sx of bleeding. No infusion issues.  Goal of Therapy:  Heparin level 0.3-0.7 units/ml Monitor platelets by anticoagulation protocol: Yes   Plan:  Continue heparin to 1600 units/hr Daily heparin level and CBC Plan to transition back to apixaban if/once PEG placed  Antonietta Jewel, PharmD, [place] Clinical Pharmacist  Pager: [phone] Phone: [phone] 11/10/2018 7:21 AM

## 2018-11-10 NOTE — Progress Notes (Signed)
UE vein mapping       has been completed. Preliminary results can be found under CV proc through chart review. Khila Papp, BS, RDMS, RVT   

## 2018-11-10 NOTE — Progress Notes (Signed)
  Speech Language Pathology Treatment: Kathleen [name] Speaking valve  Patient Details Name: Kathleen [name] MRN: [id number] DOB: 20-Feb-1951 Today's Date: 11/10/2018 Time: 8768-1157 SLP Time Calculation (min) (ACUTE ONLY): 18 min  Assessment / Plan / Recommendation Clinical Impression  Pt's trach downsized to # 4 cuffless this morning. PMV was worn totaling 15 minutes during session with replacing valve after coughing during first several minutes. She was calm, smiling with adequate work of breathing. Attempts at phonation resulted in more audible whispering and strategies implemented including deep inhalation with forceful throat clearing/vocalic phonemes.To facilitate vocal cord adduction had pt push against resistance (SLP's hands) although output continued with aphonic, whispered quality. Nothing audible during throat clear/coughing trials. Respiratory effort seems fair-good however this may impact in addition to suspicion of incomplete vocal cord involvement. Will continue PMV trials. A FEES is planned for tomorrow.       HPI HPI: 68 y.o. female admitted 09/22/2018 for surgery. PMH: permanent atrial flutter with baseline bradycardia, hypertension, super morbid obesity, IIDDM, dyslipidemia, obesity hypoventilation syndrome, CKD stage IV, chronic diastolic CHF, chronic edema. PEA 09/24/2018. Intubated 5/30-09/28/2018, again 6/11until trach 6/19. SLP was seeing pt in between intubations with concern for post-extubation dysphagia and instrumental testing recommended, although this was not completed as pt had been reintubated.      SLP Plan  Other (Comment);Continue with current plan of care(FEES 7/17)       Recommendations         Patient may use Passy-Muir Speech Valve: with SLP only PMSV Supervision: Full         Oral Care Recommendations: Oral care QID Follow up Recommendations: LTACH SLP Visit Diagnosis: Aphonia (R49.1) Plan: Other (Comment);Continue with current plan of care(FEES  7/17)       GO                Houston Siren 11/10/2018, 12:36 PM Orbie Pyo Colvin Caroli.Ed Risk analyst [phone] Office [phone]

## 2018-11-10 NOTE — Procedures (Signed)
First Trach Change  Cuffed 6 removed and cuffless 4 placed with good color change and bilateral BS noted.    Rush Farmer, M.D. [facility] Pulmonary/Critical Care Medicine. Pager: [phone]. After hours pager: [phone].

## 2018-11-10 NOTE — Plan of Care (Signed)
  Problem: Cardiac: Goal: Ability to achieve and maintain adequate cardiopulmonary perfusion will improve Outcome: Progressing   Problem: Respiratory: Goal: Patent airway maintenance will improve Outcome: Progressing   Problem: Role Relationship: Goal: Ability to communicate will improve Outcome: Progressing

## 2018-11-10 NOTE — Progress Notes (Addendum)
NAME:  LIANI CARIS, MRN:  [id number], DOB:  [birth year], LOS: 59 ADMISSION DATE:  09/22/2018, CONSULTATION DATE: 09/24/2018 REFERRING MD: Candee Furbish MD, CHIEF COMPLAINT: Cardiac arrest  Brief History   68 y/o obese woman with chronic diastolic heart failure who failed outpatient diuretics management due to chronic kidney disease.  Right heart cath on 5/29 showed markedly elevated biventricular filling pressures with normal cardiac output.  She was on cardiology service, maintained on milrinone and Lasix.  On the morning of 5/30 she went into PEA arrest, CPR performed for 8 minutes.  Intubated and transferred to ICU.  Started on CRRT 5/31.  Extubated 6/3.  Re-intubated early am 6/11 for hypercarbic respiratory failure and underwent tracheostomy on 6/19.  Past Medical History   has a past medical history of Atrial flutter ([place]), Bradycardia, Chronic diastolic (congestive) heart failure ([place]) (01/10/2015), CKD (chronic kidney disease), stage IV ([place]), Degenerative joint disease of hand, Diabetes mellitus, Dyslipidemia, Fecal occult blood test positive, GERD (gastroesophageal reflux disease), Headache, Hypertension, Inadequate material resources, Irritable bowel syndrome, Morbid obesity ([place]), Obesity hypoventilation syndrome ([place]), Post-menopausal bleeding, and Shortness of breath dyspnea.  Significant Hospital Events   5/28 Admit 5/29 RHC 5/30 PEA arrest, intubated/ CRRT 5/31 Milrinone stopped due to ectopy; brady episode- amio stopped 6/02 Attempt at SBT, chest x-ray with worsening pulmonary edema, CVVHD for volume removal 6/03 Extubated > remain on CRRT.  Refused nocturnal BiPAP 6/04 Refused nocturnal BiPAP 6/10 off CRRT 6/11 Intubated early am with hypercarbia, concern for RUL PNA; abx broadened; restarted on CRRT 6/16  No events overnight, weaning on high PS 6/17  failed vent wean, remains on levophed, midodrine and CRRT 6/18  Weaned for 5 hours on PSV 8/5 before requiring full support;  ongoing CRRT, remains on levophed 14 mcg 6/19 - TRACH + Dr Erskine Emery 6/20 -. On IV heparin gt, levophed gtt with midodrine. Off fent gtt. Pressopr needs down 6/26 - TCT tolerated for the past 24 hours  6/27 - Stable on TCT now >48hrs, tolerated first session of iHD  6/30 hypotension / bradycardia overnight, started on dopamine 7/2 Placed on ATC at 40% fiO2 7/6 Palliative care consult/ iHD 7/7 remains dependent on dopamine at 7, for bradycardia/ hypotension.   Palliative consult noted - family would like to visit 7/9 for goals of care meeting. 7/08 3.5 L off w/ iHD, weaned well all day, dopamine off late evening 7/9 great day, family meeting w/son and PMT, remains full code per patient 7/11 to ATC 7/13 remains on ATC  Consults:  Cardiology PCCM  Nephrology  EP  Procedures:  Lt PICC 5/29 >> 6/30 OETT 5/30 >> 6/3; 6/11 > 6/19 - TRACH (Dr Erskine Emery) >> R IJ HD cath 5/30 >> Aline left ulnar 5/30 >> 6/2 R nare cortrak 6/5 >> 7/13  Significant Diagnostic Tests:  TTE 5/28 >>The left ventricle has normal systolic function with an ejection fraction of 60-65%. . The right ventricle has normal systolic function. The cavity was mildly enlarged.   Right heart cath 5/29 RA = 24 RV = 94/26 PA = 95/36 (53) PCW = 30 (v = 50) Fick cardiac output/index = 6.0/2.7 PVR =3.9 WU FA sat = 91% PA sat = 58%, 58% SVC 60%  Micro Data:  SARS coronavirus 2 cepheid 5/28 >> neg MRSA PCR 5/28 >> neg Trach aspirate 6/2 >> MSSA BCx2 6/2 >> negative ...................... Tracheal aspirate 6/11 >> few GNR >> few proteus and few candida tropicalis BCx2 6/11 >> negative 10/27/2018 tracheal  aspirate>> gram-positive cocci gram-negative rods>>  Antimicrobials:  Vancomycin 6/3 > 6/4 Cefazolin 6/4 >> 6/10 ...........................Marland Kitchen Vanco 6/11 >> 6/17 Cefepime 6/11 >>6/18 Cefepime 10/27/2018>> 7/6  Interim history/subjective:  Afebrile.  FiO2 stable throughout the night at 40%. Frequent suction required  for secretions.  Telemetry with sinus bradycardia.  Objective   Blood pressure (!) 113/44, pulse 63, temperature 98 F (36.7 C), temperature source Oral, resp. rate 14, height 5' (1.524 m), weight 99.3 kg, SpO2 97 %.    FiO2 (%):  [40 %-60 %] 40 %   Intake/Output Summary (Last 24 hours) at 11/10/2018 0647 Last data filed at 11/10/2018 0600 Gross per 24 hour  Intake 2231.93 ml  Output 2900 ml  Net -668.07 ml   Filed Weights   11/08/18 0624 11/09/18 1700 11/09/18 2009  Weight: 102.4 kg 102 kg 99.3 kg   General:  Chronically ill appearing female, NAD Neuro:  Alert and interactive, moving all ext to command HEENT:  [place]/AT, PERRL, EOM-I and MMM, trach in place Cardiovascular:  RRR, Nl S1/S2 and -M/R/G Lungs:  Coarse BS diffusely, copious secretions around her trach  Abdomen:  Soft, NT, ND and +BS Musculoskeletal:  No acute deformity Skin:  Intact, MMM  Assessment & Plan:   Persistent resp failure now s/p tracheostomy - Titrate O2 for sat of 88-92% - Trach care per protocol - Continue guaifenesin. Consider adding glycopyrrolate  - Able to maintain at FiO2 of 40% over night. Will discuss with Dr. Nelda Marseille plan to change trach to a cuffless 4 and recheck SLP prior to commitment to PEG   HD dependent renal failure.   - Continue HD per nephrology.   Proteus colonization vs. Bronchitis  - S/p 5 day course cefepime completed 7/6. -  Monitor off abx.  Persistent shock after PEA arrest- resolved  Bradycardia - off dopamine 7/8,  Intermittent Afib/ hx of Aflutter Acute on chronic diastolic HF - Per primary - Volume per iHD - Not a candidate for any EP procedures   Nutrition - Continue TF's, but NGT came out today. SLP eval for swallow. If fails will need PEG  Anemia - Stable - Heparin started on 7/15, monitor CBC/ bleeding  Goals of care. - PMT involved. FULL code.   Dysphagia:  - Able to maintain at FiO2 of 40% over night. Will discuss with Dr. Nelda Marseille plan to change trach  to a cuffless 4 and recheck SLP prior to commitment to PEG  Best practice:  Diet: Continue tube feeds Pain/Anxiety/Delirium protocol (if indicated): NA VAP protocol (if indicated): In place  DVT prophylaxis: restarting heparin gtt 7/10 GI prophylaxis: Pepcid Glucose control: SSI Mobility: PT/ OT as tolerated Code Status: Full Disposition: ICU   Ina Homes, MD  [facility] PGY3  Pager [phone]  Attending Note:  68 year old female with PMH of CHF s/p cardiac arrest who was trached for respiratory failure.  Patient has tolerated TC with 40% FiO2 but continues to have large amount of secretions overnight.  On exam, coarse BS bilaterally.  I reviewed CXR myself, trach is in a good position.  Discussed with PCCM-NP.  Acute on chronic respiratory failure:  - Suction as needed for secretions  - Monitor clinically  Hypoxemia:  - Titrate O2 for sat of 88-92%  Trach status:  - Change to a cuffless 4 today  - D/C cuffed trach  Dysphagia:  - Repeat swallow evaluation today  - Hold off PEG for now until swallow evaluation is done after cuffless 4 is in  PCCM will continue to follow  Patient seen and examined, agree with above note.  I dictated the care and orders written for this patient under my direction.  Rush Farmer, [place]

## 2018-11-10 NOTE — Progress Notes (Signed)
NG tube removed by patient.  MD paged regarding meds. Tube feeds stopped for now. MD ordered for SLP to perform a bedside swallow eval. Will call speech to arrange.  Pt comfortable in bed, vitals stable.

## 2018-11-10 NOTE — Procedures (Signed)
Tracheostomy Change Note  Patient Details:   Name: MA MUNOZ DOB: [birth year] MRN: [id number]    Airway Documentation:     Evaluation  O2 sats: stable throughout Complications: No apparent complications Patient did tolerate procedure well. Bilateral Breath Sounds: Diminished    Md placed #4 cuffless shiley without complication. Blood is noted around stoma after insertion. Pt is stable at this time.   Ronaldo Miyamoto 11/10/2018, 9:40 AM

## 2018-11-11 LAB — RENAL FUNCTION PANEL
Albumin: 2.3 g/dL — ABNORMAL LOW (ref 3.5–5.0)
Anion gap: 10 (ref 5–15)
BUN: 45 mg/dL — ABNORMAL HIGH (ref 8–23)
CO2: 26 mmol/L (ref 22–32)
Calcium: 8.6 mg/dL — ABNORMAL LOW (ref 8.9–10.3)
Chloride: 96 mmol/L — ABNORMAL LOW (ref 98–111)
Creatinine, Ser: 4.36 mg/dL — ABNORMAL HIGH (ref 0.44–1.00)
GFR calc Af Amer: 11 mL/min — ABNORMAL LOW (ref >60)
GFR calc non Af Amer: 10 mL/min — ABNORMAL LOW (ref >60)
Glucose, Bld: 195 mg/dL — ABNORMAL HIGH (ref 70–99)
Phosphorus: 4.4 mg/dL (ref 2.5–4.6)
Potassium: 3.8 mmol/L (ref 3.5–5.1)
Sodium: 132 mmol/L — ABNORMAL LOW (ref 135–145)

## 2018-11-11 LAB — GLUCOSE, CAPILLARY
Glucose-Capillary: 168 mg/dL — ABNORMAL HIGH (ref 70–99)
Glucose-Capillary: 185 mg/dL — ABNORMAL HIGH (ref 70–99)
Glucose-Capillary: 179 mg/dL — ABNORMAL HIGH (ref 70–99)
Glucose-Capillary: 151 mg/dL — ABNORMAL HIGH (ref 70–99)
Glucose-Capillary: 193 mg/dL — ABNORMAL HIGH (ref 70–99)
Glucose-Capillary: 151 mg/dL — ABNORMAL HIGH (ref 70–99)

## 2018-11-11 LAB — CBC
HCT: 28.2 % — ABNORMAL LOW (ref 36.0–46.0)
Hemoglobin: 8.1 g/dL — ABNORMAL LOW (ref 12.0–15.0)
MCH: 27.9 pg (ref 26.0–34.0)
MCHC: 28.7 g/dL — ABNORMAL LOW (ref 30.0–36.0)
MCV: 97.2 fL (ref 80.0–100.0)
Platelets: 145 10*3/uL — ABNORMAL LOW (ref 150–400)
RBC: 2.9 MIL/uL — ABNORMAL LOW (ref 3.87–5.11)
WBC: 4.6 10*3/uL (ref 4.0–10.5)
nRBC: 0 % (ref 0.0–0.2)

## 2018-11-11 LAB — HEPARIN LEVEL (UNFRACTIONATED): Heparin Unfractionated: 0.36 IU/mL (ref 0.30–0.70)

## 2018-11-11 MED ORDER — HEPARIN SODIUM (PORCINE) 1000 UNIT/ML IJ SOLN
INTRAMUSCULAR | Status: AC
  Administered 2018-11-11: 18:00:00
  Filled 2018-11-11: qty 4

## 2018-11-11 MED ORDER — RENA-VITE PO TABS
1.0000 | ORAL_TABLET | Freq: Every day | ORAL | Status: DC
  Administered 2018-11-11 – 2018-11-16 (×6): 1 via ORAL
  Filled 2018-11-11 (×6): qty 1

## 2018-11-11 MED ORDER — ALBUMIN HUMAN 25 % IV SOLN
INTRAVENOUS | Status: AC
  Filled 2018-11-11: qty 100

## 2018-11-11 MED ORDER — GERHARDT'S BUTT CREAM
TOPICAL_CREAM | Freq: Two times a day (BID) | CUTANEOUS | Status: DC
  Administered 2018-11-11 – 2018-11-12 (×4): via TOPICAL
  Administered 2018-11-13: 1 via TOPICAL
  Administered 2018-11-13 – 2018-11-15 (×4): via TOPICAL
  Administered 2018-11-16 (×2): 1 via TOPICAL
  Administered 2018-11-17: 10:00:00 via TOPICAL
  Filled 2018-11-11: qty 1

## 2018-11-11 NOTE — Progress Notes (Signed)
  Speech Language Pathology     Patient Details Name: MAANYA HIPPERT MRN: [id number] DOB: 1950/08/21 Today's Date: 11/11/2018 Time:  -     FEES scheduled for this morning at 11:30- as this was first available time slot for tech time assistance.                Houston Siren 11/11/2018, 8:10 AM  Orbie Pyo Colvin Caroli.Ed Risk analyst [phone] Office [phone]

## 2018-11-11 NOTE — Progress Notes (Signed)
Pt noted to be having moderate per vaginal  Bleed, Dr Cathlean Sauer Notified, heparin stopped.

## 2018-11-11 NOTE — Progress Notes (Addendum)
Initial Nutrition Assessment  DOCUMENTATION CODES:   Obesity unspecified  INTERVENTION:    Add Magic cup TID with meals, each supplement provides 290 kcal and 9 grams of protein  Add renal MVI  Add supplementation of Vitamin C 500 mg BID and 220 mg zinc daily  Continue tube feeding until PO meets >75% of needs:  -Osmolite 1.5 @ 45 ml/hr (1080 ml) via NGT -30 ml Prostat TID  Provides:1920kcal,113grams protein,864ml free water. Meets 100% ofkcal needs and 100% of protein needs.  NUTRITION DIAGNOSIS:   Increased nutrient needs related to wound healing as evidenced by estimated needs.  Ongoing  GOAL:   Patient will meet greater than or equal to 90% of their needs  Addressed via TF  MONITOR:   PO intake, Supplement acceptance, Diet advancement, Labs, I & O's, Weight trends, TF tolerance, Skin  REASON FOR ASSESSMENT:   Consult Enteral/tube feeding initiation and management  ASSESSMENT:   68 year old with morbid obesity, acute on chronic diastolic heart failure, pulmonary hypertension transferred to the ICU after PEA arrest.  Suspect respiratory failure due to pulmonary edema   5/30 - s/p PEA arrest, intubated and transferred to ICU,CRRTinitiated 6/2- failed SBT, pulm edema 6/3 extubated, refused BiPAP 6/10- CRRT stopped  6/11- re-intubated, CRRT re-started 6/19- failed SBT, s/p trach  6/27- transition iHD 7/13- Cortrak pulled out 7/15- NGT placed  7/16- trach changed to 4 cuffless  Pt underwent FEEs this am. Diet to be advanced to DYS 2 with thin liquids per speech. NGT looked to be pulled out by accident. RD replaced it with Cortrak. Will provide pt with supplements to maximize PO protein and kcal intake (does not like Ensure or Glucerna). Recommend continuing feedings at current goal rate until PO intake increases. MD to order Vitamin C/zinc supplementation for new wound.   Admission weight: 134.8 kg Current weight99.3kg (from 7/15- need recent  wt)   I/O: +2,779 ml since admit Last HD 7/15: Net UF 2700 ml   Medications: aranesp, SS novolog, levemir, miralax Labs: Na 132 (L) corrected calcium 10 (wdl) CBG 87-185   Diet Order:   Diet Order            Diet NPO time specified  Diet effective now              EDUCATION NEEDS:   Education needs have been addressed  Skin:  Skin Assessment: Skin Integrity Issues: Skin Integrity Issues:: Incisions, Stage II Stage II: R coccyx, vertebral column Incisions: R neck/chest Other: MASD- bilateral breast  Last BM:  7/15  Height:   Ht Readings from Last 1 Encounters:  10/05/18 5' (1.524 m)    Weight:   Wt Readings from Last 1 Encounters:  11/09/18 99.3 kg    Ideal Body Weight:  45.5 kg  BMI:  Body mass index is 42.75 kg/m.  Estimated Nutritional Needs:   Kcal:  1800-2000 kcal  Protein:  100-120 grams  Fluid:  >/= 1.7 L/day   Mariana Single RD, LDN Clinical Nutrition Pager # - [phone]

## 2018-11-11 NOTE — Procedures (Signed)
Cortrak  Person Inserting Tube:  Kathleen [name], RD Tube Type:  Cortrak - 43 inches Tube Location:  Left nare Initial Placement:  Stomach Secured by: Bridle Technique Used to Measure Tube Placement:  Documented cm marking at nare/ corner of mouth Cortrak Secured At:  65 cm    No x-ray is required. RN may begin using tube.   If the tube becomes dislodged please keep the tube and contact the Cortrak team at [email or web address] ([email or web address]) for replacement.  If after hours and replacement cannot be delayed, place a NG tube and confirm placement with an abdominal x-ray.   Mariana Single RD, LDN Clinical Nutrition Pager # [phone]

## 2018-11-11 NOTE — Progress Notes (Signed)
  Speech Language Pathology Treatment: Nada Boozer Speaking valve  Patient Details Name: Kathleen [name] MRN: [id number] DOB: 08/11/1950 Today's Date: 11/11/2018 Time: 1130-1200 SLP Time Calculation (min) (ACUTE ONLY): 30 min  Assessment / Plan / Recommendation Clinical Impression  Pt donned PMV during FEES assessment with audible phonation. SLP provided verbal and demonstration cues to increase volume of inhaled air with sustained phonation for approximately 2-3 seconds. Intensity was lower and quality hoarse/raspy. Valve remained in place, no distress and vitals were stable (pt's HR runs low at baseline low 50's). Pt's trach was downsized to a #4 cuffless yesterday. Recommend she wear valve with full supervision with RN or therapy, remove during sleep and donn during po.    HPI HPI: 68 y.o. female admitted 09/22/2018 for surgery. PMH: permanent atrial flutter with baseline bradycardia, hypertension, super morbid obesity, IIDDM, dyslipidemia, obesity hypoventilation syndrome, CKD stage IV, chronic diastolic CHF, chronic edema. PEA 09/24/2018. Intubated 5/30-09/28/2018, again 6/11until trach 6/19. SLP was seeing pt in between intubations with concern for post-extubation dysphagia and instrumental testing recommended, although this was not completed as pt had been reintubated.      SLP Plan  Continue with current plan of care       Recommendations  Compensations: Slow rate;Small sips/bites;Minimize environmental distractions      Patient may use Passy-Muir Speech Valve: During all therapies with supervision;During PO intake/meals PMSV Supervision: Full         Oral Care Recommendations: Oral care QID Follow up Recommendations: (TBD) SLP Visit Diagnosis: Aphonia (R49.1) Plan: Continue with current plan of care       GO                Houston Siren 11/11/2018, 3:21 PM  Orbie Pyo Kailin Leu M.Ed Risk analyst [phone] Office [phone]

## 2018-11-11 NOTE — Progress Notes (Signed)
PROGRESS NOTE    Kathleen [name]  EHU:[id number] DOB: [birth year] DOA: 09/22/2018 PCP: Aldine Contes, MD    Brief Narrative:  68 year old female presented to the outpatient cardiology clinic for follow-up. She does have significant past medical history for permanent atrial fibrillation, hypertension, morbid obesity, type 2 diabetes mellitus, dyslipidemia, obesity hypoventilation, chronic kidney disease stage V, anddiastolic heart failure. She reported orthopnea, increased dyspnea on exertion, and increased lower extremity edema. Patient failed for outpatient diuretic management, and she was directly admitted to the hospital for further evaluation.  She had a prolonged hospitalization. Patient was initially placed on milrinone and furosemide. May 30 she suffered from a cardiac arrest, pulseless electrical activity. She recovered spontaneous circulation after 8 minutes of CPR. She was intubated and supported with mechanical ventilation.  She had worsening kidney function required CRRT May 31. She was extubated June 3, and reintubated June 11 for hypercapnic respiratory failure. She remainedventilator dependent respiratory failureand on June 19 underwent tracheostomy.  She required vasopressors and inotropes for hemodynamic decompensation.  She was transferred to [facility] on July 12.  11/10/18 Trach changed to cuffless #4, patient has been of dopamine for the last 24 H.   11/11/18 Transferred to progressive care unit.    Assessment & Plan:   Active Problems:   Type 2 diabetes mellitus with other specified complication (HCC)   Dyslipidemia   OBESITY   Acute on chronic diastolic CHF (congestive heart failure) (HCC)   Acute kidney injury superimposed on chronic kidney disease (HCC)   Acute respiratory failure with hypoxia (HCC)   VAP (ventilator-associated pneumonia) (HCC)   Shock ([place])   Acute respiratory failure (HCC)   CHF (congestive heart failure) ([place])   Palliative  care encounter   Dysphagia   Tracheostomy status ([place])    1.Acute respiratory failure, hypoxic andhypercapnic. off mechanical ventilation since June 11.Continue tolerating well trach collar, Fi02 30% to 40% with oxygen saturation 96%. Continue physical therapy and out of bed as tolerated.   2. Diastolic heart failure with hypotension. Tolerating well midodrine and fludrocortisone.Blood pressure systolic 785 to 885 mmHg, will keep MAP more than 60 mmHg.   3. Stage V CKD due to ATN, related to shock.Transitioned to regular HD on June 27, getting HD M-W-F.Patient has not shown recovery of her renal function, will need a permanent access for HD. For HD today per nephrology recommendations. Mapping ordered.   4. Swallow dysfunction.Progressing with speech therapy, today she passed her swallow evaluation and diet has been advanced.  Will remove NG tube. No need for gastric tube at this point in time.   5. Chronic atrial fibrillation. Has remained rate controlled, HR in the 50's, will continue anticoagulation with heparin, plan to transition to oral after evaluation for possible permanent HD access.   6. T2DM. Her glucose continue to be stable, am glucose today ar 195 mg/ dl. Patient will have po intake, discontinue tube feedings. Continue basal insulin therapy with 10 units of detemir,   7. Chronic anemia with iron deficiency.stable cell count, will continue close monitoring.  8. Morbid obesitywith dysplidemia.Calculated BMI is 42.Continue with atorvastatin.   9. Ventilator associated pneumonia (not present on admission). Proteus. Completed antibiotic therapy on 07/08.Now on cuffless trach #4, tolerating well, with low oxygen requirements. For possible capping trails next week for possible prompt decannulation, discussed with Dr. Nelda Marseille from pulmonary.    DVT prophylaxis:scd Code Status:full Family Communication:No family at the bedside Disposition Plan/ discharge  barriers:LATC or SNF pending on patient's respiratory progress.  Body mass index is 42.75 kg/m. Malnutrition Type:  Nutrition Problem: Inadequate oral intake Etiology: inability to eat   Malnutrition Characteristics:  Signs/Symptoms: NPO status   Nutrition Interventions:  Interventions: Tube feeding, Prostat  RN Pressure Injury Documentation:     Consultants:   Pulmonary   Surgery   Procedures:   Trach   Antimicrobials:       Subjective: Patient is feeling better, dyspnea improved but not yet back at baseline, continue to feel very weak and deconditioned.   Objective: Vitals:   11/11/18 0300 11/11/18 0314 11/11/18 0743 11/11/18 0800  BP:  103/81  (!) 117/44  Pulse: (!) 46 (!) 59 (!) 44 (!) 45  Resp: _0 Temp:  97.7 F (36.5 C)  97.7 F (36.5 C)  TempSrc:  Oral  Oral  SpO2: 100% 98% 100% 99%  Weight:      Height:        Intake/Output Summary (Last 24 hours) at 11/11/2018 1139 Last data filed at 11/11/2018 0500 Gross per 24 hour  Intake 1378.07 ml  Output -  Net 1378.07 ml   Filed Weights   11/08/18 0624 11/09/18 1700 11/09/18 2009  Weight: 102.4 kg 102 kg 99.3 kg    Examination:   General: deconditioned Neurology: Awake and alert, non focal  E ENT: no pallor, no icterus, oral mucosa moist/ trach in place.  Cardiovascular: No JVD. S1-S2 present, rhythmic, no gallops, rubs, or murmurs. Non pitting lower extremity edema. Pulmonary: positive breath sounds bilaterally, no wheezing, rhonchi or rales. Gastrointestinal. Abdomen mild distended with no organomegaly, non tender, no rebound or guarding Skin. No rashes Musculoskeletal: no joint deformities     Data Reviewed: I have personally reviewed following labs and imaging studies  CBC: Recent Labs  Lab 11/07/18 0407 11/08/18 0522 11/09/18 1727 11/10/18 0502 11/11/18 0413  WBC 4.9 4.8 5.6 5.1 4.6  HGB 8.0* 8.8* 8.6* 8.6* 8.1*  HCT 28.0* 30.3* 29.2* 29.2* 28.2*  MCV  100.4* 98.4 97.0 99.3 97.2  PLT 189 186 158 158 168*   Basic Metabolic Panel: Recent Labs  Lab 11/07/18 0407 11/08/18 0522 11/09/18 0259 11/10/18 0502 11/11/18 0413  NA 133* 133* 133* 133* 132*  K 4.7 4.4 4.9 3.9 3.8  CL 96* 96* 96* 95* 96*  CO2 _1 GLUCOSE 120* 107* 142* 189* 195*  BUN 77* 43* 50* 31* 45*  CREATININE 5.02* 4.01* 5.00* 3.34* 4.36*  CALCIUM 9.0 8.8* 9.1 8.3* 8.6*  MG  --   --   --  2.2  --   PHOS 5.6* 4.4 5.6* 3.3 4.4   GFR: Estimated Creatinine Clearance: 13.2 mL/min (A) (by C-G formula based on SCr of 4.36 mg/dL (H)). Liver Function Tests: Recent Labs  Lab 11/07/18 0407 11/08/18 0522 11/09/18 0259 11/10/18 0502 11/11/18 0413  ALBUMIN 2.3* 2.8* 2.7* 2.4* 2.3*   No results for input(s): LIPASE, AMYLASE in the last 168 hours. No results for input(s): AMMONIA in the last 168 hours. Coagulation Profile: No results for input(s): INR, PROTIME in the last 168 hours. Cardiac Enzymes: No results for input(s): CKTOTAL, CKMB, CKMBINDEX, TROPONINI in the last 168 hours. BNP (last 3 results) No results for input(s): PROBNP in the last 8760 hours. HbA1C: No results for input(s): HGBA1C in the last 72 hours. CBG: Recent Labs  Lab 11/10/18 2001 11/10/18 2323 11/11/18 0314 11/11/18 0802 11/11/18 1122  GLUCAP 88 113* 168* 185* 179*   Lipid Profile: No results for input(s): CHOL, HDL,  LDLCALC, TRIG, CHOLHDL, LDLDIRECT in the last 72 hours. Thyroid Function Tests: No results for input(s): TSH, T4TOTAL, FREET4, T3FREE, THYROIDAB in the last 72 hours. Anemia Panel: No results for input(s): VITAMINB12, FOLATE, FERRITIN, TIBC, IRON, RETICCTPCT in the last 72 hours.    Radiology Studies: I have reviewed all of the imaging during this hospital visit personally     Scheduled Meds: . aspirin  81 mg Oral Daily  . atorvastatin  40 mg Per Tube q1800  . chlorhexidine gluconate (MEDLINE KIT)  15 mL Mouth Rinse BID  . Chlorhexidine Gluconate Cloth   6 each Topical Daily  . Chlorhexidine Gluconate Cloth  6 each Topical Daily  . darbepoetin (ARANESP) injection - NON-DIALYSIS  200 mcg Subcutaneous Q Wed-1800  . famotidine  20 mg Per Tube Daily  . feeding supplement (PRO-STAT SUGAR FREE 64)  30 mL Per Tube TID  . fludrocortisone  0.2 mg Oral Daily  . guaiFENesin  15 mL Per Tube Q6H  . insulin aspart  0-20 Units Subcutaneous Q4H  . insulin detemir  10 Units Subcutaneous QHS  . midodrine  10 mg Oral TID WC  . polyethylene glycol  17 g Oral Daily  . sodium chloride flush  10-40 mL Intracatheter Q12H   Continuous Infusions: . sodium chloride 250 mL (10/31/18 2358)  . sodium chloride    . sodium chloride    . albumin human 25 g (11/07/18 1315)  . feeding supplement (OSMOLITE 1.5 CAL) 45 mL/hr at 11/11/18 0500  . heparin Stopped (11/11/18 1000)     LOS: 50 days        Mauricio Gerome Apley, MD

## 2018-11-11 NOTE — Progress Notes (Signed)
[facility]    NEPHROLOGY PROGRESS NOTE  SUBJECTIVE: Awake and alert.  In good spirits.  Patient seen and examined.  Denies chest pain, shortness of breath, nausea, vomiting, diarrhea or dysuria.  All other review of systems are negative.   OBJECTIVE:  Vitals:   11/11/18 0800 11/11/18 1204  BP: (!) 117/44   Pulse: (!) 45 (!) 50  Resp:    Temp: 97.7 F (36.5 C)   SpO2: 99% 96%    Intake/Output Summary (Last 24 hours) at 11/11/2018 1222 Last data filed at 11/11/2018 0500 Gross per 24 hour  Intake 1044.04 ml  Output -  Net 1044.04 ml      General:  AAOx3 NAD HEENT: MMM [place] AT anicteric sclera Neck:  No JVD, no adenopathy, positive tracheostomy CV:  Heart RRR  Lungs:  L/S CTA bilaterally Abd:  abd SNT/ND with normal BS, obese GU:  Bladder non-palpable Extremities: Trace bilateral lower extremity edema. Skin:  No skin rash  MEDICATIONS:  . aspirin  81 mg Oral Daily  . atorvastatin  40 mg Per Tube q1800  . chlorhexidine gluconate (MEDLINE KIT)  15 mL Mouth Rinse BID  . Chlorhexidine Gluconate Cloth  6 each Topical Daily  . Chlorhexidine Gluconate Cloth  6 each Topical Daily  . darbepoetin (ARANESP) injection - NON-DIALYSIS  200 mcg Subcutaneous Q Wed-1800  . famotidine  20 mg Per Tube Daily  . feeding supplement (PRO-STAT SUGAR FREE 64)  30 mL Per Tube TID  . fludrocortisone  0.2 mg Oral Daily  . guaiFENesin  15 mL Per Tube Q6H  . insulin aspart  0-20 Units Subcutaneous Q4H  . insulin detemir  10 Units Subcutaneous QHS  . midodrine  10 mg Oral TID WC  . polyethylene glycol  17 g Oral Daily  . sodium chloride flush  10-40 mL Intracatheter Q12H       LABS:   CBC Latest Ref Rng & Units 11/11/2018 11/10/2018 11/09/2018  WBC 4.0 - 10.5 K/uL 4.6 5.1 5.6  Hemoglobin 12.0 - 15.0 g/dL 8.1(L) 8.6(L) 8.6(L)  Hematocrit 36.0 - 46.0 % 28.2(L) 29.2(L) 29.2(L)  Platelets 150 - 400 K/uL 145(L) 158 158    CMP Latest Ref Rng & Units 11/11/2018 11/10/2018 11/09/2018   Glucose 70 - 99 mg/dL 195(H) 189(H) 142(H)  BUN 8 - 23 mg/dL 45(H) 31(H) 50(H)  Creatinine 0.44 - 1.00 mg/dL 4.36(H) 3.34(H) 5.00(H)  Sodium 135 - 145 mmol/L 132(L) 133(L) 133(L)  Potassium 3.5 - 5.1 mmol/L 3.8 3.9 4.9  Chloride 98 - 111 mmol/L 96(L) 95(L) 96(L)  CO2 22 - 32 mmol/L 26 26 24   Calcium 8.9 - 10.3 mg/dL 8.6(L) 8.3(L) 9.1  Total Protein 6.5 - 8.1 g/dL - - -  Total Bilirubin 0.3 - 1.2 mg/dL - - -  Alkaline Phos 38 - 126 U/L - - -  AST 15 - 41 U/L - - -  ALT 0 - 44 U/L - - -    Lab Results  Component Value Date   PTH 33 10/12/2018   CALCIUM 8.6 (L) 11/11/2018   CAION 1.23 10/24/2018   PHOS 4.4 11/11/2018       Component Value Date/Time   COLORURINE AMBER (A) 05/22/2013 1826   APPEARANCEUR HAZY (A) 05/22/2013 1826   LABSPEC 1.024 05/22/2013 1826   PHURINE 6.0 05/22/2013 1826   GLUCOSEU NEGATIVE 05/22/2013 1826   HGBUR NEGATIVE 05/22/2013 1826   BILIRUBINUR SMALL (A) 05/22/2013 1826   KETONESUR NEGATIVE 05/22/2013 1826   PROTEINUR 100 (A)  05/22/2013 1826   UROBILINOGEN 4.0 (H) 05/22/2013 1826   NITRITE NEGATIVE 05/22/2013 1826   LEUKOCYTESUR NEGATIVE 05/22/2013 1826      Component Value Date/Time   PHART 7.320 (L) 10/24/2018 0952   PCO2ART 48.7 (H) 10/24/2018 0952   PO2ART 69.0 (L) 10/24/2018 0952   HCO3 25.2 10/24/2018 0952   TCO2 27 10/24/2018 0952   ACIDBASEDEF 1.0 10/24/2018 0952   O2SAT 92.0 10/24/2018 0952       Component Value Date/Time   IRON 44 10/12/2018 1640   TIBC 379 10/12/2018 1640   IRONPCTSAT 12 10/12/2018 1640       ASSESSMENT/PLAN:    68 year old female patient with a past medical history significant for chronic kidney disease stage IV with a baseline creatinine of 1.7-1.9 who presented with ATN secondary to shock, chronic respiratory failure, Proteus in the sputum, and acute on chronic diastolic heart failure.  1.  Acute kidney injury Superimposed on CKD IV (baseline 1.7-1.9) likely ATN secondary to shock, anuric: Transitioned to  iHD on 6/27. Using right [facility] and on MWF schedule.  She is on midodrine for hypotension at 20 TID and florinef 0.2 mg daily added 7/4.   No signs of recovery at present.  Continue supportive care with dialysis at this present time. HD MWF.  Awaiting vein mapping today and plan to d/w VVS re: perm access prior to discharge. She's still on heparin gtt for A fib and it would be easiest to try for surgery before she's started on longer acting anticoag.   2.  Proteus +respiratory culture: s/p cefepime, ended 7/8 per PCCM.    3.  Chronic respiratory failure: Status post tracheostomy. Currently on vent, per pulmonary, weaning as tolerated. Now TC.  4.  Acute on chronic diastolic heart failure: Appreciate the expertise of the heart failure team.  Plans to try to correlate bradycardia with hypotension to see if could/ would be a candidate for TVP/ RV micro device--> not a candidate for these interventions per notes; not felt to be a candidate for PPM per EP team.  She is ondopamine at present but thought to be related to no midodrine for > 1day.  Per above will likely replace NGT for short term at least.   5.  Anemia due to chronic disease and acute blood loss: Continue ESA, Increase Aranesp to 200 mcg for 7/15, status post.  Hb stable in mid 8s.   6.  Bradycardia, PEA cardiac arrest/ Afib :  As above in #4. On hep gtt as well.  7.  Dispo: remains in ICU.  Palliative care discussion noted and greatly appreciated.    Plan is for LTAC.      [place], DO, [place]

## 2018-11-11 NOTE — Care Management Important Message (Signed)
Important Message  Patient Details  Name: Kathleen [name] MRN: [id number] Date of Birth: 24-Sep-1950   Medicare Important Message Given:  Yes     Memory Argue 11/11/2018, 3:38 PM

## 2018-11-11 NOTE — Progress Notes (Signed)
Pt transferred to dialysis for  Treatment .

## 2018-11-11 NOTE — Progress Notes (Signed)
ANTICOAGULATION CONSULT NOTE - Follow Up Consult  Pharmacy Consult for heparin Indication: atrial fibrillation  Allergies  Allergen Reactions  . Doxycycline     REACTION: Wheals and pruritus    Patient Measurements: Height: 5' (152.4 cm) Weight: 218 lb 14.7 oz (99.3 kg) IBW/kg (Calculated) : 45.5 Heparin Dosing Weight: 72 kg  Vital Signs: Temp: 97.7 F (36.5 C) (07/17 0314) Temp Source: Oral (07/17 0314) BP: 103/81 (07/17 0314) Pulse Rate: 59 (07/17 0314)  Labs: Recent Labs    11/09/18 0259  11/09/18 1727 11/10/18 0502 11/11/18 0413  HGB  --    < > 8.6* 8.6* 8.1*  HCT  --   --  29.2* 29.2* 28.2*  PLT  --   --  158 158 145*  HEPARINUNFRC 0.24*  --   --  0.32 0.36  CREATININE 5.00*  --   --  3.34* 4.36*   < > = values in this interval not displayed.    Estimated Creatinine Clearance: 13.2 mL/min (A) (by C-G formula based on SCr of 4.36 mg/dL (H)).   Assessment: Kathleen [name] with complicated admission on Eliquis at home for hx of AFib, transitioned to heparin on admission which has been held since 6/29 due to bleeding and bruising at trach site. Palliative care was consulted and it was unclear whether pt would be able to wean off dopamine support, so heparin continued to be held. Pt now tolerating iHD without pressors.  Heparin level continues to be therapeutic at 0.3, on 1600 units/hr. Hgb stable at 8.1, plt 145. No s/sx of bleeding. No infusion issues.  Goal of Therapy:  Heparin level 0.3-0.7 units/ml Monitor platelets by anticoagulation protocol: Yes   Plan:  Continue heparin to 1600 units/hr Daily heparin level and CBC Plan to transition back to apixaban if/once PEG placed  [name] PharmD., BCPS Clinical Pharmacist 11/11/2018 7:42 AM

## 2018-11-11 NOTE — Progress Notes (Addendum)
NAME:  Kathleen [name], MRN:  [id number], DOB:  1951/02/15, LOS: 13 ADMISSION DATE:  09/22/2018, CONSULTATION DATE: 09/24/2018 REFERRING MD: Candee Furbish MD, CHIEF COMPLAINT: Cardiac arrest  Brief History   69 y/o obese woman with chronic diastolic heart failure who failed outpatient diuretics management due to chronic kidney disease.  Right heart cath on 5/29 showed markedly elevated biventricular filling pressures with normal cardiac output.  She was on cardiology service, maintained on milrinone and Lasix.  On the morning of 5/30 she went into PEA arrest, CPR performed for 8 minutes.  Intubated and transferred to ICU.  Started on CRRT 5/31.  Extubated 6/3.  Re-intubated early am 6/11 for hypercarbic respiratory failure and underwent tracheostomy on 6/19.  Past Medical History   has a past medical history of Atrial flutter ([place]), Bradycardia, Chronic diastolic (congestive) heart failure ([place]) (01/10/2015), CKD (chronic kidney disease), stage IV ([place]), Degenerative joint disease of hand, Diabetes mellitus, Dyslipidemia, Fecal occult blood test positive, GERD (gastroesophageal reflux disease), Headache, Hypertension, Inadequate material resources, Irritable bowel syndrome, Morbid obesity ([place]), Obesity hypoventilation syndrome ([place]), Post-menopausal bleeding, and Shortness of breath dyspnea.  Significant Hospital Events   5/28 Admit 5/29 RHC 5/30 PEA arrest, intubated/ CRRT 5/31 Milrinone stopped due to ectopy; brady episode- amio stopped 6/02 Attempt at SBT, chest x-ray with worsening pulmonary edema, CVVHD for volume removal 6/03 Extubated > remain on CRRT.  Refused nocturnal BiPAP 6/04 Refused nocturnal BiPAP 6/10 off CRRT 6/11 Intubated early am with hypercarbia, concern for RUL PNA; abx broadened; restarted on CRRT 6/16  No events overnight, weaning on high PS 6/17  failed vent wean, remains on levophed, midodrine and CRRT 6/18  Weaned for 5 hours on PSV 8/5 before requiring full support;  ongoing CRRT, remains on levophed 14 mcg 6/19 - TRACH + Dr Erskine Emery 6/20 -. On IV heparin gt, levophed gtt with midodrine. Off fent gtt. Pressopr needs down 6/26 - TCT tolerated for the past 24 hours  6/27 - Stable on TCT now >48hrs, tolerated first session of iHD  6/30 hypotension / bradycardia overnight, started on dopamine 7/2 Placed on ATC at 40% fiO2 7/6 Palliative care consult/ iHD 7/7 remains dependent on dopamine at 7, for bradycardia/ hypotension.   Palliative consult noted - family would like to visit 7/9 for goals of care meeting. 7/08 3.5 L off w/ iHD, weaned well all day, dopamine off late evening 7/9 great day, family meeting w/son and PMT, remains full code per patient 7/11 to ATC 7/13 remains on ATC 7/16 Trach change to 4 cuffless 7/17 Remains on 40% ATC, Swallow study pending  Consults:  Cardiology PCCM  Nephrology  EP  Procedures:  Lt PICC 5/29 >> 6/30 OETT 5/30 >> 6/3; 6/11 > 6/19 - TRACH (Dr Erskine Emery) >> R IJ HD cath 5/30 >> Aline left ulnar 5/30 >> 6/2 R nare cortrak 6/5 >> 7/13  Significant Diagnostic Tests:  TTE 5/28 >>The left ventricle has normal systolic function with an ejection fraction of 60-65%. . The right ventricle has normal systolic function. The cavity was mildly enlarged.   Right heart cath 5/29 RA = 24 RV = 94/26 PA = 95/36 (53) PCW = 30 (v = 50) Fick cardiac output/index = 6.0/2.7 PVR =3.9 WU FA sat = 91% PA sat = 58%, 58% SVC 60%  Micro Data:  SARS coronavirus 2 cepheid 5/28 >> neg MRSA PCR 5/28 >> neg Trach aspirate 6/2 >> MSSA BCx2 6/2 >> negative ...................... Tracheal aspirate 6/11 >> few  GNR >> few proteus and few candida tropicalis BCx2 6/11 >> negative 10/27/2018 tracheal aspirate>> gram-positive cocci gram-negative rods>>  Antimicrobials:  Vancomycin 6/3 > 6/4 Cefazolin 6/4 >> 6/10 ...........................Marland Kitchen Vanco 6/11 >> 6/17 Cefepime 6/11 >>6/18 Cefepime 10/27/2018>> 7/6  Interim history/subjective:   Afebrile.  ATC at 40%. Tolerating trach change well,  Moderate thick tan secretions per nursing Telemetry indicates SB Net + 3700 cc, +1500 last 24 Vaginal bleeding, heparin on hold, HGB drop 0.5 grams (? Hemodilution)  Objective   Blood pressure (!) 117/44, pulse (!) 45, temperature 97.7 F (36.5 C), temperature source Oral, resp. rate 20, height 5' (1.524 m), weight 99.3 kg, SpO2 99 %.    FiO2 (%):  [35 %-50 %] 40 %   Intake/Output Summary (Last 24 hours) at 11/11/2018 0948 Last data filed at 11/11/2018 0500 Gross per 24 hour  Intake 1378.07 ml  Output -  Net 1378.07 ml   Filed Weights   11/08/18 0624 11/09/18 1700 11/09/18 2009  Weight: 102.4 kg 102 kg 99.3 kg   General:  Chronically ill appearing female, NAD Neuro:  Alert and interactive, moving all ext to command, lip speaking appropriately HEENT:  [place]/AT, PERRL, EOM-I and MMM, trach is clean, secure, midline and  intact Cardiovascular:  S1, S2, RRR, No RMG Lungs:  Bilateral chest excursion, Coarse throughout, trach is clean, moderate thick tan secretions per nursing  Abdomen:  Soft, NT, ND and +BS, Obese Musculoskeletal:  No obvious deformities noted Skin:  Intact, warm and dry, no lesions or rash noted  Assessment & Plan:   Persistent resp failure now s/p tracheostomy Tolerating 4.0 Cuff less well overnight 7/17 Mild edema per CXR 7/16 - Titrate O2 for sat of 88-92% - Trach care per protocol - Continue guaifenesin as mucolytic. Consider adding glycopyrrolate  - Doing well on 40% ATC with 4.0 cuff less trach.  - Trend CXR  - Consider lasix   HD dependent renal failure.   - Continue HD per nephrology.   Proteus colonization vs. Bronchitis  Afebrile , WBC 4.6 - S/p 5 day course cefepime completed 7/6. -  Monitor off abx. - Trend fever and WBC - Re-culture as is clinically indicated  Persistent shock after PEA arrest- resolved  Continues Bradycardia - off dopamine 7/8,  Intermittent Afib/ hx of Aflutter  Acute on chronic diastolic HF - Per primary - Volume per iHD - Not a candidate for any EP procedures   Nutrition - Continue TF's,  SLP eval for swallow eval 7/17 . If fails will need PEG  Anemia HGB drop from 8.5 to 8.1 overnight Some vaginal bleeding noted 7/17>> stopped per primary team - Monitor for continued bleeding -Trend CBC, platelets - Transfuse for HGB < 7 - Will transition back to  apixaban if/once PEG placed  Goals of care. - PMT involved. FULL code.   Dysphagia:  - Able to maintain  ATC at at FiO2 of 40% over night with 4.0 cuff less trach - Swallow pending - If fails swallow with smaller, cuff less  trach, will need to move forward with PEG placement  Best practice:  Diet: Continue tube feeds Pain/Anxiety/Delirium protocol (if indicated): NA VAP protocol (if indicated): In place  DVT prophylaxis: restarting heparin gtt 7/10 GI prophylaxis: Pepcid Glucose control: SSI Mobility: PT/ OT as tolerated Code Status: Full Disposition: ICU    PCCM will continue to follow for trach.  Magdalen Spatz, RN, MSN, AGACNP-BC [place] Pager # [phone] After 4 pm call [phone]  11/11/2018 10:19 AM  Attending Note:  68 year old female s/p tracheostomy post cardiac arrest with prolonged respiratory failure.  Patient was changed to a cuffless 4 shiley trach on 7/16 and has done really well.  Scheduled for a FEES today.  On exam, following commands with clear lungs.  I reviewed CXR myself, trach is in a good position.  Discussed with PCCM-NP.  Acute respiratory failure:  - Monitor for airway protection  Trach status:  - Maintain size 4 cuffless for now  - If passes swallow and does well over the weekend will consider capping trials on Monday on the road to decannulation.  Dysphagia:  - FEES today  Hypoxemia:  - Titrate O2 for sat of 88-92%  PCCM will see again on Monday.  Patient seen and examined, agree with above note.   I dictated the care and orders written for this patient under my direction.  Rush Farmer, [place]

## 2018-11-11 NOTE — Progress Notes (Signed)
Objective Swallowing Evaluation: Type of Study: FEES-Fiberoptic Endoscopic Evaluation of Swallow   Patient Details  Name: Kathleen [name] MRN: [id number] Date of Birth: 1950-06-10  Today's Date: 11/11/2018 Time: SLP Start Time (ACUTE ONLY): 1128 -SLP Stop Time (ACUTE ONLY): 1210  SLP Time Calculation (min) (ACUTE ONLY): 42 min   Past Medical History:  Past Medical History:  Diagnosis Date  . Atrial flutter ([place])    a. permanent.  . Bradycardia   . Chronic diastolic (congestive) heart failure ([place]) 01/10/2015  . CKD (chronic kidney disease), stage IV ([place])   . Degenerative joint disease of hand   . Diabetes mellitus   . Dyslipidemia   . Fecal occult blood test positive   . GERD (gastroesophageal reflux disease)   . Headache   . Hypertension   . Inadequate material resources   . Irritable bowel syndrome   . Morbid obesity ([place])   . Obesity hypoventilation syndrome ([place])   . Post-menopausal bleeding   . Shortness of breath dyspnea    multifactorial from obesity, deconditioning, obesity hypoventilation syndrome   Past Surgical History:  Past Surgical History:  Procedure Laterality Date  . CHOLECYSTECTOMY    . EYE SURGERY     left lens implant s/p cataracts  . IR FLUORO GUIDE CV LINE RIGHT  10/03/2018  . IR US GUIDE VASC ACCESS RIGHT  10/03/2018  . OTHER SURGICAL HISTORY     right shoulder tendon repair 05/2011  . RIGHT HEART CATH N/A 09/23/2018   Procedure: RIGHT HEART CATH;  Surgeon: Jolaine Artist, MD;  Location: [facility];  Service: Cardiovascular;  Laterality: N/A;   HPI: 68 y.o. female admitted 09/22/2018 for surgery. PMH: permanent atrial flutter with baseline bradycardia, hypertension, super morbid obesity, IIDDM, dyslipidemia, obesity hypoventilation syndrome, CKD stage IV, chronic diastolic CHF, chronic edema. PEA 09/24/2018. Intubated 5/30-09/28/2018, again 6/11until trach 6/19. SLP was seeing pt in between intubations with concern for post-extubation dysphagia  and instrumental testing recommended, although this was not completed as pt had been reintubated.   Subjective: pt alert, smiling    Assessment / Plan / Recommendation  CHL IP CLINICAL IMPRESSIONS 11/11/2018  Clinical Impression FEES completed wearing her PMV. Minimal pharyngeal dysphagia without aspiration of any consistency. Small amount of thin liquid residue that began to drape over the interarytenoid space but did not appear to enter her laryngeal vestibule. Timing of swallow initiation adequate without significant pharyngeal residue. At onset, non swallow tasks performed revealing adequate anatomy. Vocal cords appeared to functionally adduct- there was whitish/clear tissue on posterior vocal cord possibly due to intubation. Suspect decreased laryngeal sensation as mucous observed sitting on vocal cords at rest, prior to po's. Discussed diet texture options and pt prefers ( SLP in agreement) to initiate Dys 2 (minced-ground), thin liquids, straws allowed, pills whole in applesauce, wear speaking valve with all meals/meds and full supervision initially. Continued ST to progress diet.       SLP Visit Diagnosis Dysphagia, pharyngeal phase (R13.13)  Attention and concentration deficit following --  Frontal lobe and executive function deficit following --  Impact on safety and function Mild aspiration risk      CHL IP TREATMENT RECOMMENDATION 11/11/2018  Treatment Recommendations Therapy as outlined in treatment plan below     Prognosis 11/11/2018  Prognosis for Safe Diet Advancement Good  Barriers to Reach Goals --  Barriers/Prognosis Comment --    CHL IP DIET RECOMMENDATION 11/11/2018  SLP Diet Recommendations Dysphagia 2 (Fine chop) solids;Thin liquid  Liquid Administration  via Straw;Cup  Medication Administration Whole meds with puree  Compensations Slow rate;Small sips/bites;Minimize environmental distractions  Postural Changes Seated upright at 90 degrees      CHL IP OTHER  RECOMMENDATIONS 11/11/2018  Recommended Consults --  Oral Care Recommendations Oral care BID  Other Recommendations --      CHL IP FOLLOW UP RECOMMENDATIONS 11/11/2018  Follow up Recommendations (No Data)      CHL IP FREQUENCY AND DURATION 11/11/2018  Speech Therapy Frequency (ACUTE ONLY) min 2x/week  Treatment Duration 2 weeks           CHL IP ORAL PHASE 11/11/2018  Oral Phase WFL  Oral - Pudding Teaspoon --  Oral - Pudding Cup --  Oral - Honey Teaspoon --  Oral - Honey Cup --  Oral - Nectar Teaspoon --  Oral - Nectar Cup --  Oral - Nectar Straw --  Oral - Thin Teaspoon --  Oral - Thin Cup --  Oral - Thin Straw --  Oral - Puree --  Oral - Mech Soft --  Oral - Regular --  Oral - Multi-Consistency --  Oral - Pill --  Oral Phase - Comment --    CHL IP PHARYNGEAL PHASE 11/11/2018  Pharyngeal Phase Impaired  Pharyngeal- Pudding Teaspoon --  Pharyngeal --  Pharyngeal- Pudding Cup --  Pharyngeal --  Pharyngeal- Honey Teaspoon --  Pharyngeal --  Pharyngeal- Honey Cup WFL  Pharyngeal --  Pharyngeal- Nectar Teaspoon --  Pharyngeal --  Pharyngeal- Nectar Cup WFL  Pharyngeal --  Pharyngeal- Nectar Straw --  Pharyngeal --  Pharyngeal- Thin Teaspoon --  Pharyngeal --  Pharyngeal- Thin Cup Inter-arytenoid space residue  Pharyngeal Material does not enter airway  Pharyngeal- Thin Straw Inter-arytenoid space residue  Pharyngeal --  Pharyngeal- Puree --  Pharyngeal --  Pharyngeal- Mechanical Soft --  Pharyngeal --  Pharyngeal- Regular WFL  Pharyngeal --  Pharyngeal- Multi-consistency --  Pharyngeal --  Pharyngeal- Pill --  Pharyngeal --  Pharyngeal Comment --     CHL IP CERVICAL ESOPHAGEAL PHASE 11/11/2018  Cervical Esophageal Phase (No Data)  Pudding Teaspoon --  Pudding Cup --  Honey Teaspoon --  Honey Cup --  Nectar Teaspoon --  Nectar Cup --  Nectar Straw --  Thin Teaspoon --  Thin Cup --  Thin Straw --  Puree --  Mechanical Soft --  Regular --   Multi-consistency --  Pill --  Cervical Esophageal Comment --     Houston Siren 11/11/2018, 2:47 PM Orbie Pyo Demere Dotzler M.Ed Risk analyst [phone] Office [phone]

## 2018-11-12 ENCOUNTER — Inpatient Hospital Stay (HOSPITAL_COMMUNITY): Payer: Medicare HMO

## 2018-11-12 DIAGNOSIS — I5032 Chronic diastolic (congestive) heart failure: Secondary | ICD-10-CM

## 2018-11-12 DIAGNOSIS — N184 Chronic kidney disease, stage 4 (severe): Secondary | ICD-10-CM

## 2018-11-12 DIAGNOSIS — I484 Atypical atrial flutter: Secondary | ICD-10-CM

## 2018-11-12 LAB — GLUCOSE, CAPILLARY
Glucose-Capillary: 144 mg/dL — ABNORMAL HIGH (ref 70–99)
Glucose-Capillary: 178 mg/dL — ABNORMAL HIGH (ref 70–99)
Glucose-Capillary: 205 mg/dL — ABNORMAL HIGH (ref 70–99)
Glucose-Capillary: 236 mg/dL — ABNORMAL HIGH (ref 70–99)
Glucose-Capillary: 200 mg/dL — ABNORMAL HIGH (ref 70–99)
Glucose-Capillary: 153 mg/dL — ABNORMAL HIGH (ref 70–99)
Glucose-Capillary: 111 mg/dL — ABNORMAL HIGH (ref 70–99)

## 2018-11-12 LAB — CBC
HCT: 28.8 % — ABNORMAL LOW (ref 36.0–46.0)
Hemoglobin: 8.4 g/dL — ABNORMAL LOW (ref 12.0–15.0)
MCH: 28.8 pg (ref 26.0–34.0)
MCHC: 29.2 g/dL — ABNORMAL LOW (ref 30.0–36.0)
MCV: 98.6 fL (ref 80.0–100.0)
Platelets: 137 10*3/uL — ABNORMAL LOW (ref 150–400)
RBC: 2.92 MIL/uL — ABNORMAL LOW (ref 3.87–5.11)
RDW: 26.8 % — ABNORMAL HIGH (ref 11.5–15.5)
WBC: 5.5 10*3/uL (ref 4.0–10.5)
nRBC: 0 % (ref 0.0–0.2)

## 2018-11-12 LAB — RENAL FUNCTION PANEL
Albumin: 2.7 g/dL — ABNORMAL LOW (ref 3.5–5.0)
Anion gap: 10 (ref 5–15)
BUN: 37 mg/dL — ABNORMAL HIGH (ref 8–23)
CO2: 25 mmol/L (ref 22–32)
Calcium: 8.5 mg/dL — ABNORMAL LOW (ref 8.9–10.3)
Chloride: 97 mmol/L — ABNORMAL LOW (ref 98–111)
Creatinine, Ser: 3.56 mg/dL — ABNORMAL HIGH (ref 0.44–1.00)
GFR calc Af Amer: 15 mL/min — ABNORMAL LOW (ref >60)
GFR calc non Af Amer: 13 mL/min — ABNORMAL LOW (ref >60)
Glucose, Bld: 166 mg/dL — ABNORMAL HIGH (ref 70–99)
Phosphorus: 2.8 mg/dL (ref 2.5–4.6)
Potassium: 4.2 mmol/L (ref 3.5–5.1)
Sodium: 132 mmol/L — ABNORMAL LOW (ref 135–145)

## 2018-11-12 LAB — MAGNESIUM: Magnesium: 2.2 mg/dL (ref 1.7–2.4)

## 2018-11-12 MED ORDER — ALPRAZOLAM 0.5 MG PO TABS
1.0000 mg | ORAL_TABLET | Freq: Three times a day (TID) | ORAL | Status: DC | PRN
  Administered 2018-11-12 – 2018-11-14 (×3): 1 mg via NASOGASTRIC
  Filled 2018-11-12 (×3): qty 2

## 2018-11-12 MED ORDER — FUROSEMIDE 10 MG/ML IJ SOLN
80.0000 mg | Freq: Once | INTRAMUSCULAR | Status: AC
  Administered 2018-11-12: 80 mg via INTRAVENOUS
  Filled 2018-11-12: qty 8

## 2018-11-12 MED ORDER — ALBUMIN HUMAN 25 % IV SOLN
INTRAVENOUS | Status: AC
  Filled 2018-11-12: qty 100

## 2018-11-12 MED ORDER — ALTEPLASE 2 MG IJ SOLR
4.0000 mg | Freq: Once | INTRAMUSCULAR | Status: AC
  Administered 2018-11-12: 22:00:00 4 mg
  Filled 2018-11-12: qty 4

## 2018-11-12 MED ORDER — HEPARIN SODIUM (PORCINE) 1000 UNIT/ML IJ SOLN
INTRAMUSCULAR | Status: AC
  Filled 2018-11-12: qty 4

## 2018-11-12 NOTE — Progress Notes (Addendum)
Pt in bed , noted to have gown pulled off , states she's hot, is a bit restless, continues to have intermittent desaturations, down to the 70's despite suctioning and increased O2 via trach,  Has small amount of mucus Dr Cathlean Sauer present , assessed patient and decision made for transfer to [facility] for better obs.care continues.

## 2018-11-12 NOTE — Progress Notes (Signed)
D/w hospitalist.  Desaturations noted with CXR revealing pulmonary edema.  Will plan HD today.

## 2018-11-12 NOTE — Consult Note (Signed)
Hospital Consult    Reason for Consult: Permanent AV fistula access Referring Physician: Nephrology MRN #:  [id number]  History of Present Illness: This is a 68 y.o. female with multiple medical problems including hypertension, hyperlipidemia, diabetes, stage IV chronic kidney disease, diastolic heart failure that vascular surgery has been consulted for placement of permanent AV fistula access.  In review of the records it appears that she has chronic diastolic heart failure who was admitted for failure of outpatient diuretics management due to her underlying chronic kidney disease.  She reportedly had a PEA arrest on 09/24/2018 and got CPR for 8 minutes.  She was ultimately intubated and transferred to the ICU.  Ultimately underwent tracheostomy. Patient reports that she is right-handed.  She states she has never had any AV fistula access in the past.  She states she has some numbness in her thumb and index finger of both arms.  Past Medical History:  Diagnosis Date  . Atrial flutter ([place])    a. permanent.  . Bradycardia   . Chronic diastolic (congestive) heart failure ([place]) 01/10/2015  . CKD (chronic kidney disease), stage IV ([place])   . Degenerative joint disease of hand   . Diabetes mellitus   . Dyslipidemia   . Fecal occult blood test positive   . GERD (gastroesophageal reflux disease)   . Headache   . Hypertension   . Inadequate material resources   . Irritable bowel syndrome   . Morbid obesity ([place])   . Obesity hypoventilation syndrome ([place])   . Post-menopausal bleeding   . Shortness of breath dyspnea    multifactorial from obesity, deconditioning, obesity hypoventilation syndrome    Past Surgical History:  Procedure Laterality Date  . CHOLECYSTECTOMY    . EYE SURGERY     left lens implant s/p cataracts  . IR FLUORO GUIDE CV LINE RIGHT  10/03/2018  . IR US GUIDE VASC ACCESS RIGHT  10/03/2018  . OTHER SURGICAL HISTORY     right shoulder tendon repair 05/2011  . RIGHT HEART  CATH N/A 09/23/2018   Procedure: RIGHT HEART CATH;  Surgeon: Jolaine Artist, MD;  Location: [facility];  Service: Cardiovascular;  Laterality: N/A;    Allergies  Allergen Reactions  . Doxycycline     REACTION: Wheals and pruritus    Prior to Admission medications   Medication Sig Start Date End Date Taking? Authorizing Provider  apixaban (ELIQUIS) 5 MG TABS tablet Take 1 tablet (5 mg total) by mouth 2 (two) times daily. 06/07/18  Yes Aldine Contes, MD  atorvastatin (LIPITOR) 40 MG tablet Take 1 tablet (40 mg total) by mouth daily. 02/14/18  Yes Aldine Contes, MD  gabapentin (NEURONTIN) 300 MG capsule TAKE 1 CAPSULE EVERY MORNING, TAKE 1 CAPSULE IN THE AFTERNOON, AND TAKE 2 CAPSULES EVERY NIGHT Patient taking differently: Take 300 mg by mouth. TAKE 300 mg CAPSULE EVERY MORNING, TAKE 300 mg CAPSULE IN THE AFTERNOON, AND TAKE 600 mg  EVERY NIGHT 09/06/18  Yes Narendra, Nischal, MD  insulin detemir (LEVEMIR) 100 UNIT/ML injection Inject 65 units total into the skin once daily. 02/23/18  Yes [place], Carley D, DO  liraglutide (VICTOZA) 18 MG/3ML SOPN Inject 0.3 mLs (1.8 mg total) into the skin daily. 02/09/18  Yes Seawell, Jaimie A, DO  metolazone (ZAROXOLYN) 5 MG tablet Take 5 mg by mouth daily.   Yes [provider]  omeprazole (PRILOSEC) 20 MG capsule Take 20 mg by mouth daily.   Yes [provider]  POTASSIUM CHLORIDE CRYS  ER PO Take 20 mEq by mouth daily. For 10 days   Yes [provider]  torsemide (DEMADEX) 20 MG tablet Take 20 mg by mouth 2 (two) times daily.   Yes [provider]  ACCU-CHEK AVIVA PLUS test strip TEST BLOOD SUGAR THREE TIMES DAILY Patient taking differently: 1 each by Other route 3 (three) times daily.  09/20/18   Aldine Contes, MD  Accu-Chek FastClix Lancets MISC TEST BLOOD SUGAR THREE TIMES DAILY 09/06/18   Aldine Contes, MD  Accu-Chek Softclix Lancets lancets TEST BLOOD SUGAR THREE TIMES DAILY Patient  taking differently: 1 each 3 (three) times daily.  09/20/18   Aldine Contes, MD  Blood Glucose Monitoring Suppl (ACCU-CHEK AVIVA PLUS) w/Device KIT TEST BLOOD SUGAR THREE TIMES DAILY 09/05/18   Aldine Contes, MD  Insulin Pen Needle (B-D UF III MINI PEN NEEDLES) 31G X 5 MM MISC USE ONE TIME DAILY TO INJECT VICTOZA. DX:E11.9 09/06/18   Aldine Contes, MD  Insulin Syringe-Needle U-100 (BD VEO INSULIN SYRINGE U/F) 31G X 15/64" 1 ML MISC USE TO INJECT INSULIN ONE TIME A DAY 06/03/18   Aldine Contes, MD  metFORMIN (GLUCOPHAGE) 500 MG tablet Take 1 tablet (500 mg total) by mouth 2 (two) times daily with a meal. 09/13/18   Aldine Contes, MD  Multiple Vitamins-Minerals (HEALTHY EYES PO) Take 1 tablet by mouth daily. Reported on 05/17/2015    [provider]  NON FORMULARY Place 2 L into the nose daily. Wears with exertion and qhs    [provider]    Social History   Socioeconomic History  . Marital status: Divorced    Spouse name: Not on file  . Number of children: 2  . Years of education: Not on file  . Highest education level: Not on file  Occupational History  . Occupation: unemployed    Fish farm manager: UNEMPLOYED  Social Needs  . Financial resource strain: Not on file  . Food insecurity    Worry: Not on file    Inability: Not on file  . Transportation needs    Medical: Not on file    Non-medical: Not on file  Tobacco Use  . Smoking status: Never Smoker  . Smokeless tobacco: Never Used  Substance and Sexual Activity  . Alcohol use: No    Alcohol/week: 0.0 standard drinks  . Drug use: No  . Sexual activity: Not Currently  Lifestyle  . Physical activity    Days per week: Not on file    Minutes per session: Not on file  . Stress: Not on file  Relationships  . Social Herbalist on phone: Not on file    Gets together: Not on file    Attends religious service: Not on file    Active member of club or organization: Not on file    Attends meetings  of clubs or organizations: Not on file    Relationship status: Not on file  . Intimate partner violence    Fear of current or ex partner: Not on file    Emotionally abused: Not on file    Physically abused: Not on file    Forced sexual activity: Not on file  Other Topics Concern  . Not on file  Social History Narrative   Single.   Divorced x2.   Has 2 children, by two different fathers.   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 May apply for  disability/SSI given her pain in her hands which has made interviewing for jobs difficult.   Never Smoked   Alcohol use-no   Drug use-no   Regular exercise-yes      Financial assistance approved for 100% discount at [facility] and has [facility] card per Enedina Finner   02/18/2010              Family History  Problem Relation Age of Onset  . Diabetes Mother   . Obesity Mother   . Stroke Father   . Diabetes Sister   . Colon cancer Neg Hx     ROS: [x]  Positive   [ ]  Negative   [ ]  All sytems reviewed and are negative  Cardiovascular: []  chest pain/pressure []  palpitations []  SOB lying flat []  DOE []  pain in legs while walking []  pain in legs at rest []  pain in legs at night []  non-healing ulcers []  hx of DVT []  swelling in legs  Pulmonary: []  productive cough []  asthma/wheezing []  home O2  Neurologic: []  weakness in []  arms []  legs [x]  numbness in [x]  arms []  legs (thumb and index finger bilaterally) []  hx of CVA []  mini stroke [] difficulty speaking or slurred speech []  temporary loss of vision in one eye []  dizziness  Hematologic: []  hx of cancer []  bleeding problems []  problems with blood clotting easily  Endocrine:   []  diabetes []  thyroid disease  GI []  vomiting blood []  blood in stool  GU: []  CKD/renal failure []  HD--[]  M/W/F or []  T/T/S []  burning with urination []  blood in urine  Psychiatric: []  anxiety []  depression   Musculoskeletal: []  arthritis []  joint pain  Integumentary: []  rashes []  ulcers  Constitutional: []  fever []  chills   Physical Examination  Vitals:   11/12/18 1048 11/12/18 1150  BP: 114/65   Pulse: (!) 53 (!) 53  Resp: (!) 23 (!) 23  Temp: 98.2 F (36.8 C)   SpO2: 95% 92%   Body mass index is 41.76 kg/m.  General:  WDWN in NAD Gait: Not observed HENT: WNL, normocephalic, trach Pulmonary: normal non-labored breathing, without Rales, rhonchi,  wheezing Cardiac: regular, without  Murmurs, rubs or gallops Abdomen: soft, NT/ND, no masses Skin: without rashes Vascular Exam/Pulses:  Right Left  Radial 1+ (weak) absent  Ulnar 2+ (normal) 2+ (normal)  Brachial 2+ (normal) 2+ (normal)                Musculoskeletal: no muscle wasting or atrophy  Neurologic: A&O X 3; Appropriate Affect ; SENSATION: normal; MOTOR FUNCTION:  moving all extremities equally. Speech is fluent/normal   CBC    Component Value Date/Time   WBC 5.5 11/12/2018 0447   RBC 2.92 (L) 11/12/2018 0447   HGB 8.4 (L) 11/12/2018 0447   HGB 11.5 06/17/2018 1401   HCT 28.8 (L) 11/12/2018 0447   HCT 37.8 06/17/2018 1401   PLT 137 (L) 11/12/2018 0447   PLT 135 (L) 06/17/2018 1401   MCV 98.6 11/12/2018 0447   MCV 89 06/17/2018 1401   MCH 28.8 11/12/2018 0447   MCHC 29.2 (L) 11/12/2018 0447   RDW 26.8 (H) 11/12/2018 0447   RDW 15.3 06/17/2018 1401   LYMPHSABS 1.7 10/20/2018 0417   LYMPHSABS 1.7 01/22/2015 1217   MONOABS 2.2 (H) 10/20/2018 0417   EOSABS 0.5 10/20/2018 0417   EOSABS 0.2 01/22/2015 1217   BASOSABS 0.0 10/20/2018 0417   BASOSABS 0.0 01/22/2015 1217    BMET    Component Value Date/Time   NA 132 (  L) 11/12/2018 0447   NA 143 09/13/2018 1203   K 4.2 11/12/2018 0447   CL 97 (L) 11/12/2018 0447   CO2 25 11/12/2018 0447   GLUCOSE 166 (H) 11/12/2018 0447   BUN 37 (H) 11/12/2018 0447   BUN 75 (H) 09/13/2018 1203   CREATININE 3.56 (H) 11/12/2018 0447   CREATININE 1.60 (H)  04/06/2016 1424   CALCIUM 8.5 (L) 11/12/2018 0447   GFRNONAA 13 (L) 11/12/2018 0447   GFRNONAA 65 06/07/2014 1027   GFRAA 15 (L) 11/12/2018 0447   GFRAA 75 06/07/2014 1027    COAGS: Lab Results  Component Value Date   INR 1.2 10/13/2018   INR 1.1 10/03/2018   INR 1.2 11/05/2007     Non-Invasive Vascular Imaging:    I reviewed her noninvasive upper extremity vein mapping and she has nice cephalic veins bilaterally from the distal forearm proximal.   ASSESSMENT/PLAN: This is a 68 y.o. female with complex medical course as noted above that presents with ATN secondary to shock after PEA arrest in the setting of baseline stage IV chronic kidney disease.  Vascular surgery has been consulted for permanent dialysis access.  Patient is right-handed.  She currently has a tunneled right IJ catheter that she is using by radiology.  In review of her vein mapping she has nice cephalic vein from the wrist up the arm.  Unfortunately I do not appreciate a palpable radial pulse at the left wrist and only a ulnar pulse and brachial pulse palpable.  Probably need a brachiocephalic at the left elbow.  Discussed high risk for steal as well as other risk factors.  Due to busy OR schedule likely be Tuesday at the earliest before we can place her fistula.  Please hold oral anticoagulation for afib - heparin ok if needed.  Restrict left arm.  Marty Heck, MD [facility] Office: [phone] Pager: [place]

## 2018-11-12 NOTE — Plan of Care (Signed)
Problem: Education: Goal: Knowledge of General Education information will improve Description: Including pain rating scale, medication(s)/side effects and non-pharmacologic comfort measures 11/12/2018 0212 by Marcie Mowers, RN Outcome: Progressing 11/12/2018 0212 by Marcie Mowers, RN Outcome: Progressing   Problem: Clinical Measurements: Goal: Ability to maintain clinical measurements within normal limits will improve 11/12/2018 0212 by Marcie Mowers, RN Outcome: Progressing 11/12/2018 0212 by Marcie Mowers, RN Outcome: Progressing Goal: Will remain free from infection 11/12/2018 0212 by Marcie Mowers, RN Outcome: Progressing 11/12/2018 0212 by Marcie Mowers, RN Outcome: Progressing Goal: Diagnostic test results will improve 11/12/2018 0212 by Marcie Mowers, RN Outcome: Progressing 11/12/2018 0212 by Marcie Mowers, RN Outcome: Progressing Goal: Cardiovascular complication will be avoided 11/12/2018 0212 by Marcie Mowers, RN Outcome: Progressing 11/12/2018 0212 by Marcie Mowers, RN Outcome: Progressing   Problem: Coping: Goal: Level of anxiety will decrease 11/12/2018 0212 by Marcie Mowers, RN Outcome: Progressing 11/12/2018 0212 by Marcie Mowers, RN Outcome: Progressing   Problem: Pain Managment: Goal: General experience of comfort will improve 11/12/2018 0212 by Marcie Mowers, RN Outcome: Progressing 11/12/2018 0212 by Marcie Mowers, RN Outcome: Progressing   Problem: Skin Integrity: Goal: Risk for impaired skin integrity will decrease 11/12/2018 0212 by Marcie Mowers, RN Outcome: Progressing 11/12/2018 0212 by Marcie Mowers, RN Outcome: Progressing   Problem: Education: Goal: Ability to demonstrate management of disease process will improve 11/12/2018 0212 by Marcie Mowers, RN Outcome: Progressing 11/12/2018 0212 by Marcie Mowers, RN Outcome: Progressing Goal: Ability to verbalize understanding of medication therapies will improve 11/12/2018 0212 by Marcie Mowers, RN Outcome: Progressing 11/12/2018 0212 by Marcie Mowers, RN Outcome: Progressing   Problem: Activity: Goal: Capacity to carry out activities will improve 11/12/2018 0212 by Marcie Mowers, RN Outcome: Progressing 11/12/2018 0212 by Marcie Mowers, RN Outcome: Progressing   Problem: Cardiac: Goal: Ability to achieve and maintain adequate cardiopulmonary perfusion will improve 11/12/2018 0212 by Marcie Mowers, RN Outcome: Progressing 11/12/2018 0212 by Marcie Mowers, RN Outcome: Progressing   Problem: Education: Goal: Knowledge about tracheostomy care/management will improve 11/12/2018 0212 by Marcie Mowers, RN Outcome: Progressing 11/12/2018 0212 by Marcie Mowers, RN Outcome: Progressing   Problem: Activity: Goal: Ability to tolerate increased activity will improve 11/12/2018 0212 by Marcie Mowers, RN Outcome: Progressing 11/12/2018 0212 by Marcie Mowers, RN Outcome: Progressing   Problem: Health Behavior/Discharge Planning: Goal: Ability to manage tracheostomy will improve 11/12/2018 0212 by Marcie Mowers, RN Outcome: Progressing 11/12/2018 0212 by Marcie Mowers, RN Outcome: Progressing   Problem: Respiratory: Goal: Patent airway maintenance will improve 11/12/2018 0212 by Marcie Mowers, RN Outcome: Progressing 11/12/2018 0212 by Marcie Mowers, RN Outcome: Progressing   Problem: Role Relationship: Goal: Ability to communicate will improve 11/12/2018 0212 by Marcie Mowers, RN Outcome: Progressing 11/12/2018 0212 by Marcie Mowers, RN Outcome: Progressing   Problem: Education: Goal: Knowledge of the prescribed therapeutic regimen will improve 11/12/2018 0212 by Marcie Mowers, RN Outcome: Progressing 11/12/2018 0212 by Marcie Mowers, RN Outcome: Progressing   Problem: Coping: Goal: Ability to identify and develop effective coping behavior will improve 11/12/2018 0212 by Marcie Mowers, RN Outcome: Progressing 11/12/2018 0212 by Marcie Mowers,  RN Outcome: Progressing   Problem: Clinical Measurements: Goal: Quality of life will improve 11/12/2018 0212 by Marcie Mowers, RN Outcome: Progressing 11/12/2018 0212 by Marcie Mowers, RN Outcome: Progressing  Problem: Role Relationship: Goal: Family's ability to cope with current situation will improve 11/12/2018 0212 by Marcie Mowers, RN Outcome: Progressing 11/12/2018 0212 by Marcie Mowers, RN Outcome: Progressing Goal: Ability to verbalize concerns, feelings, and thoughts to partner or family member will improve 11/12/2018 0212 by Marcie Mowers, RN Outcome: Progressing 11/12/2018 0212 by Marcie Mowers, RN Outcome: Progressing   Problem: Pain Management: Goal: Satisfaction with pain management regimen will improve 11/12/2018 0212 by Marcie Mowers, RN Outcome: Progressing 11/12/2018 0212 by Marcie Mowers, RN Outcome: Progressing

## 2018-11-12 NOTE — Progress Notes (Signed)
Pt transferred to 2h as ordered, report given to Rn,pt made comfortable in bed , care continues.

## 2018-11-12 NOTE — Progress Notes (Signed)
[facility]    NEPHROLOGY PROGRESS NOTE  SUBJECTIVE: Awake and alert.  In good spirits.  Patient seen and examined.  Denies chest pain, shortness of breath, nausea, vomiting, diarrhea or dysuria.  All other review of systems are negative.   OBJECTIVE:  Vitals:   11/12/18 0753 11/12/18 0828  BP: (!) 108/39   Pulse:  (!) 57  Resp:  20  Temp: 98.8 F (37.1 C)   SpO2:  97%    Intake/Output Summary (Last 24 hours) at 11/12/2018 1048 Last data filed at 11/12/2018 0506 Gross per 24 hour  Intake 1235.3 ml  Output 2500 ml  Net -1264.7 ml      General:  AAOx3 NAD HEENT: MMM [place] AT anicteric sclera Neck:  No JVD, no adenopathy, positive tracheostomy CV:  Heart RRR  Lungs:  L/S CTA bilaterally Abd:  abd SNT/ND with normal BS, obese GU:  Bladder non-palpable Extremities: Trace bilateral lower extremity edema. Skin:  No skin rash  MEDICATIONS:  . aspirin  81 mg Oral Daily  . atorvastatin  40 mg Per Tube q1800  . chlorhexidine gluconate (MEDLINE KIT)  15 mL Mouth Rinse BID  . Chlorhexidine Gluconate Cloth  6 each Topical Daily  . Chlorhexidine Gluconate Cloth  6 each Topical Daily  . darbepoetin (ARANESP) injection - NON-DIALYSIS  200 mcg Subcutaneous Q Wed-1800  . famotidine  20 mg Per Tube Daily  . feeding supplement (PRO-STAT SUGAR FREE 64)  30 mL Per Tube TID  . fludrocortisone  0.2 mg Oral Daily  . Gerhardt's butt cream   Topical BID  . guaiFENesin  15 mL Per Tube Q6H  . insulin aspart  0-20 Units Subcutaneous Q4H  . insulin detemir  10 Units Subcutaneous QHS  . midodrine  10 mg Oral TID WC  . multivitamin  1 tablet Oral QHS  . polyethylene glycol  17 g Oral Daily  . sodium chloride flush  10-40 mL Intracatheter Q12H       LABS:   CBC Latest Ref Rng & Units 11/12/2018 11/11/2018 11/10/2018  WBC 4.0 - 10.5 K/uL 5.5 4.6 5.1  Hemoglobin 12.0 - 15.0 g/dL 8.4(L) 8.1(L) 8.6(L)  Hematocrit 36.0 - 46.0 % 28.8(L) 28.2(L) 29.2(L)  Platelets 150 - 400 K/uL  137(L) 145(L) 158    CMP Latest Ref Rng & Units 11/12/2018 11/11/2018 11/10/2018  Glucose 70 - 99 mg/dL 166(H) 195(H) 189(H)  BUN 8 - 23 mg/dL 37(H) 45(H) 31(H)  Creatinine 0.44 - 1.00 mg/dL 3.56(H) 4.36(H) 3.34(H)  Sodium 135 - 145 mmol/L 132(L) 132(L) 133(L)  Potassium 3.5 - 5.1 mmol/L 4.2 3.8 3.9  Chloride 98 - 111 mmol/L 97(L) 96(L) 95(L)  CO2 22 - 32 mmol/L _0 Calcium 8.9 - 10.3 mg/dL 8.5(L) 8.6(L) 8.3(L)  Total Protein 6.5 - 8.1 g/dL - - -  Total Bilirubin 0.3 - 1.2 mg/dL - - -  Alkaline Phos 38 - 126 U/L - - -  AST 15 - 41 U/L - - -  ALT 0 - 44 U/L - - -    Lab Results  Component Value Date   PTH 33 10/12/2018   CALCIUM 8.5 (L) 11/12/2018   CAION 1.23 10/24/2018   PHOS 2.8 11/12/2018       Component Value Date/Time   COLORURINE AMBER (A) 05/22/2013 1826   APPEARANCEUR HAZY (A) 05/22/2013 1826   LABSPEC 1.024 05/22/2013 1826   PHURINE 6.0 05/22/2013 1826   GLUCOSEU NEGATIVE 05/22/2013 1826   HGBUR NEGATIVE 05/22/2013 1826  BILIRUBINUR SMALL (A) 05/22/2013 1826   KETONESUR NEGATIVE 05/22/2013 1826   PROTEINUR 100 (A) 05/22/2013 1826   UROBILINOGEN 4.0 (H) 05/22/2013 1826   NITRITE NEGATIVE 05/22/2013 1826   LEUKOCYTESUR NEGATIVE 05/22/2013 1826      Component Value Date/Time   PHART 7.320 (L) 10/24/2018 0952   PCO2ART 48.7 (H) 10/24/2018 0952   PO2ART 69.0 (L) 10/24/2018 0952   HCO3 25.2 10/24/2018 0952   TCO2 27 10/24/2018 0952   ACIDBASEDEF 1.0 10/24/2018 0952   O2SAT 92.0 10/24/2018 0952       Component Value Date/Time   IRON 44 10/12/2018 1640   TIBC 379 10/12/2018 1640   IRONPCTSAT 12 10/12/2018 1640       ASSESSMENT/PLAN:    68 year old female patient with a past medical history significant for chronic kidney disease stage IV with a baseline creatinine of 1.7-1.9 who presented with ATN secondary to shock, chronic respiratory failure, Proteus in the sputum, and acute on chronic diastolic heart failure.  1.  Acute kidney injury Superimposed on  CKD IV (baseline 1.7-1.9) likely ATN secondary to shock, anuric: Transitioned to iHD on 6/27. Using right [facility] and on MWF schedule.  She is on midodrine for hypotension at 20 TID and florinef 0.2 mg daily added 7/4.   No signs of recovery at present.  Continue supportive care with dialysis at this present time. HD MWF.  Will ask vascular to place fistula.  She's still on heparin gtt for A fib and it would be easiest to try for surgery before she's started on longer acting anticoag. Phos stable at 2.8  2.  Proteus +respiratory culture: s/p cefepime, ended 7/8 per PCCM.    3.  Chronic respiratory failure: Status post tracheostomy. Currently on vent, per pulmonary, weaning as tolerated. Now TC.  4.  Acute on chronic diastolic heart failure: Appreciate the expertise of the heart failure team.  Plans to try to correlate bradycardia with hypotension to see if could/ would be a candidate for TVP/ RV micro device--> not a candidate for these interventions per notes; not felt to be a candidate for PPM per EP team.   5.  Anemia due to chronic disease and acute blood loss: Continue ESA, Increase Aranesp to 200 mcg for 7/15, status post.  Hb stable in mid 8s.   6.  Bradycardia, PEA cardiac arrest/ Afib :  As above in #4. On hep gtt as well.  7.  Dispo: remains in ICU.  Palliative care discussion noted and greatly appreciated.    Plan is for LTAC.      [place], DO, [place]

## 2018-11-12 NOTE — Progress Notes (Signed)
Patient ID: Kathleen [name], female   DOB: 06-06-1950, 68 y.o.   MRN: [id number] Patient seems to be tolerating DYS-2 diet.  No indication for surgical gastrostomy tube.  Will sign off  Imogene Burn. Georgette Dover, MD, [place] [facility] [phone]  11/12/2018 5:51 PM

## 2018-11-12 NOTE — Progress Notes (Signed)
Patient continued to have worsening dyspnea, episodic desaturation down to 70%.  No much secretions from her trach, her chest radiograph with pulmonary edema.  We will transfer patient to the intensive care unit, will have a available a cuff trach in case patient needs to go back in mechanical ventilation.  I spoke with Dr. Maryjane Hurter (nephrology), patient will have ultrafiltration today for fluid removal.

## 2018-11-12 NOTE — Progress Notes (Addendum)
PROGRESS NOTE    Kathleen [name]  WUX:[id number] DOB: Nov 12, 1950 DOA: 09/22/2018 PCP: Aldine Contes, MD    Brief Narrative:  68 year old female presented to the outpatient cardiology clinic for follow-up. She does have significant past medical history for permanent atrial fibrillation, hypertension, morbid obesity, type 2 diabetes mellitus, dyslipidemia, obesity hypoventilation, chronic kidney disease stage V, anddiastolic heart failure. She reported orthopnea, increased dyspnea on exertion, and increased lower extremity edema. Patient failed for outpatient diuretic management, and she was directly admitted to the hospital for further evaluation.  She had a prolonged hospitalization. Patient was initially placed on milrinone and furosemide. May 30 she suffered from a cardiac arrest, pulseless electrical activity. She recovered spontaneous circulation after 8 minutes of CPR. She was intubated and supported with mechanical ventilation.  She had worsening kidney function required CRRT May 31. She was extubated June 3, and reintubated June 11 for hypercapnic respiratory failure. She remainedventilator dependent respiratory failureand on June 19 underwent tracheostomy.  She required vasopressors and inotropes for hemodynamic decompensation.  She was transferred to [facility] on July 12.  11/10/18 Trach changed to cuffless #4, patient has been of dopamine for the last 24 H.  11/11/18 Transferred to progressive care unit.     Assessment & Plan:   Active Problems:   Type 2 diabetes mellitus with other specified complication (HCC)   Dyslipidemia   OBESITY   Acute on chronic diastolic CHF (congestive heart failure) (HCC)   Acute kidney injury superimposed on chronic kidney disease (HCC)   Acute respiratory failure with hypoxia (HCC)   VAP (ventilator-associated pneumonia) (HCC)   Shock ([place])   Acute respiratory failure (HCC)   CHF (congestive heart failure) ([place])  Palliative care encounter   Dysphagia   Tracheostomy status ([place])   1.Acute respiratory failure, hypoxic andhypercapnic. off mechanical ventilation since June 11.This am patient noted to have increase work of breathing, chest film with pulmonary edema, personally reviewed. Her Fi02 is 40% with oxygenation at 92%. Had HD yesterday, will order 80 mg IV furosemide x1 and continue oxymetry monitoring, if worsening oxygenation may need ultrafiltration.   2. Diastolic heart failure with hypotension. Blood pressure systolic 98, 253 and 664 mmHg. Will continue midodrine and fludrocortisone. Continue telemetry monitoring.   3. Stage V CKD due to ATN, related to shock.Transitioned to regular HD on June 27, getting HD M-W-F. Today with positive signs of hypervolemia, will give one dose of furosemide IV, will follow with further nephrology recommendations.  4. Swallow dysfunction. Will encourage patient po intake, if good toleration will remove NG tube.   5. Chronic atrial fibrillation. She has remained rate controlled, will continue to hold on IV heparin due to vaginal bleeding.  6. T2DM.Patient with poor po intake, glucose this am 166, will continue with basal insulin therapy with 10 units of detemir,   7. Chronic anemia with iron deficiency.Hgb today at 8,4 with hct at 28.8, platelets 137. Will continue to hold on heparin for now due to vaginal bleeding.  8. Morbid obesitywith dysplidemia. Her BMI is 42. On atorvastatin.   9. Ventilator associated pneumonia (not present on admission). Proteus. Completed antibiotic therapy on 07/08.Cuffless trach #4, Continue trach care per protocol. Aspiration precautions.   DVT prophylaxis:scd Code Status:full Family Communication:No family at the bedside Disposition Plan/ discharge barriers:LATC or SNF pending on patient's respiratory progress.    Body mass index is 41.76 kg/m. Malnutrition Type:  Nutrition Problem: Increased  nutrient needs Etiology: wound healing   Malnutrition Characteristics:  Signs/Symptoms: estimated needs  Nutrition Interventions:  Interventions: Tube feeding, Prostat, MVI, Magic cup  RN Pressure Injury Documentation:     Consultants:   Nephrology   Pulmonary   Procedures:     Antimicrobials:       Subjective: Patient this am having episodic anxiety, moderate in intensity, no associated with chest pain or dyspnea, no improving or worsening factors. Poor appetite. Vaginal bleeding has decreased.   Objective: Vitals:   11/12/18 0330 11/12/18 0349 11/12/18 0753 11/12/18 0828  BP:  (!) 98/41 (!) 108/39   Pulse:  (!) 50  (!) 57  Resp:  (!) 25  20  Temp:  (!) 97.5 F (36.4 C) 98.8 F (37.1 C)   TempSrc:  Oral Oral   SpO2: 99% 95%  97%  Weight:      Height:        Intake/Output Summary (Last 24 hours) at 11/12/2018 1046 Last data filed at 11/12/2018 0506 Gross per 24 hour  Intake 1235.3 ml  Output 2500 ml  Net -1264.7 ml   Filed Weights   11/09/18 2009 11/11/18 1414 11/11/18 1724  Weight: 99.3 kg 99.3 kg 97 kg    Examination:   General: deconditioned and ill looking appearing  Neurology: Awake and alert, non focal  E ENT: mild pallor, no icterus, oral mucosa moist Cardiovascular: No JVD. S1-S2 present, rhythmic, no gallops, rubs, or murmurs. No lower extremity edema. Pulmonary: positive breath sounds bilaterally, adequate air movement, no wheezing, rhonchi or rales. Gastrointestinal. Abdomen mild distended with no organomegaly, non tender, no rebound or guarding Skin. No rashes Musculoskeletal: no joint deformities     Data Reviewed: I have personally reviewed following labs and imaging studies  CBC: Recent Labs  Lab 11/08/18 0522 11/09/18 1727 11/10/18 0502 11/11/18 0413 11/12/18 0447  WBC 4.8 5.6 5.1 4.6 5.5  HGB 8.8* 8.6* 8.6* 8.1* 8.4*  HCT 30.3* 29.2* 29.2* 28.2* 28.8*  MCV 98.4 97.0 99.3 97.2 98.6  PLT 186 158 158 145* 137*    Basic Metabolic Panel: Recent Labs  Lab 11/08/18 0522 11/09/18 0259 11/10/18 0502 11/11/18 0413 11/12/18 0447  NA 133* 133* 133* 132* 132*  K 4.4 4.9 3.9 3.8 4.2  CL 96* 96* 95* 96* 97*  CO2 24 24 26 26 25   GLUCOSE 107* 142* 189* 195* 166*  BUN 43* 50* 31* 45* 37*  CREATININE 4.01* 5.00* 3.34* 4.36* 3.56*  CALCIUM 8.8* 9.1 8.3* 8.6* 8.5*  MG  --   --  2.2  --  2.2  PHOS 4.4 5.6* 3.3 4.4 2.8   GFR: Estimated Creatinine Clearance: 16 mL/min (A) (by C-G formula based on SCr of 3.56 mg/dL (H)). Liver Function Tests: Recent Labs  Lab 11/08/18 0522 11/09/18 0259 11/10/18 0502 11/11/18 0413 11/12/18 0447  ALBUMIN 2.8* 2.7* 2.4* 2.3* 2.7*   No results for input(s): LIPASE, AMYLASE in the last 168 hours. No results for input(s): AMMONIA in the last 168 hours. Coagulation Profile: No results for input(s): INR, PROTIME in the last 168 hours. Cardiac Enzymes: No results for input(s): CKTOTAL, CKMB, CKMBINDEX, TROPONINI in the last 168 hours. BNP (last 3 results) No results for input(s): PROBNP in the last 8760 hours. HbA1C: No results for input(s): HGBA1C in the last 72 hours. CBG: Recent Labs  Lab 11/11/18 1845 11/11/18 2041 11/11/18 2328 11/12/18 0349 11/12/18 0808  GLUCAP 151* 193* 151* 144* 178*   Lipid Profile: No results for input(s): CHOL, HDL, LDLCALC, TRIG, CHOLHDL, LDLDIRECT in the last 72 hours. Thyroid Function Tests: No results for  input(s): TSH, T4TOTAL, FREET4, T3FREE, THYROIDAB in the last 72 hours. Anemia Panel: No results for input(s): VITAMINB12, FOLATE, FERRITIN, TIBC, IRON, RETICCTPCT in the last 72 hours.    Radiology Studies: I have reviewed all of the imaging during this hospital visit personally     Scheduled Meds: . aspirin  81 mg Oral Daily  . atorvastatin  40 mg Per Tube q1800  . chlorhexidine gluconate (MEDLINE KIT)  15 mL Mouth Rinse BID  . Chlorhexidine Gluconate Cloth  6 each Topical Daily  . Chlorhexidine Gluconate Cloth  6  each Topical Daily  . darbepoetin (ARANESP) injection - NON-DIALYSIS  200 mcg Subcutaneous Q Wed-1800  . famotidine  20 mg Per Tube Daily  . feeding supplement (PRO-STAT SUGAR FREE 64)  30 mL Per Tube TID  . fludrocortisone  0.2 mg Oral Daily  . Gerhardt's butt cream   Topical BID  . guaiFENesin  15 mL Per Tube Q6H  . insulin aspart  0-20 Units Subcutaneous Q4H  . insulin detemir  10 Units Subcutaneous QHS  . midodrine  10 mg Oral TID WC  . multivitamin  1 tablet Oral QHS  . polyethylene glycol  17 g Oral Daily  . sodium chloride flush  10-40 mL Intracatheter Q12H   Continuous Infusions: . sodium chloride 250 mL (10/31/18 2358)  . albumin human 25 g (11/11/18 1508)  . feeding supplement (OSMOLITE 1.5 CAL) 1,000 mL (11/11/18 2300)     LOS: 51 days        Jaiana Sheffer Gerome Apley, MD

## 2018-11-12 NOTE — Progress Notes (Signed)
NAME:  Kathleen [name], MRN:  [id number], DOB:  06/12/1950, LOS: 85 ADMISSION DATE:  09/22/2018, CONSULTATION DATE: 09/24/2018 REFERRING MD: Candee Furbish MD, CHIEF COMPLAINT: Cardiac arrest  Brief History   68 y/o obese woman with chronic diastolic heart failure who failed outpatient diuretics management due to chronic kidney disease.  Right heart cath on 5/29 showed markedly elevated biventricular filling pressures with normal cardiac output.  She was on cardiology service, maintained on milrinone and Lasix.  On the morning of 5/30 she went into PEA arrest, CPR performed for 8 minutes.  Intubated and transferred to ICU.  Started on CRRT 5/31.  Extubated 6/3.  Re-intubated early am 6/11 for hypercarbic respiratory failure and underwent tracheostomy on 6/19.  Past Medical History   has a past medical history of Atrial flutter ([place]), Bradycardia, Chronic diastolic (congestive) heart failure ([place]) (01/10/2015), CKD (chronic kidney disease), stage IV ([place]), Degenerative joint disease of hand, Diabetes mellitus, Dyslipidemia, Fecal occult blood test positive, GERD (gastroesophageal reflux disease), Headache, Hypertension, Inadequate material resources, Irritable bowel syndrome, Morbid obesity ([place]), Obesity hypoventilation syndrome ([place]), Post-menopausal bleeding, and Shortness of breath dyspnea.  Significant Hospital Events   5/28 Admit 5/29 RHC 5/30 PEA arrest, intubated/ CRRT 5/31 Milrinone stopped due to ectopy; brady episode- amio stopped 6/02 Attempt at SBT, chest x-ray with worsening pulmonary edema, CVVHD for volume removal 6/03 Extubated > remain on CRRT.  Refused nocturnal BiPAP 6/04 Refused nocturnal BiPAP 6/10 off CRRT 6/11 Intubated early am with hypercarbia, concern for RUL PNA; abx broadened; restarted on CRRT 6/16  No events overnight, weaning on high PS 6/17  failed vent wean, remains on levophed, midodrine and CRRT 6/18  Weaned for 5 hours on PSV 8/5 before requiring full support;  ongoing CRRT, remains on levophed 14 mcg 6/19 - TRACH + Dr Erskine Emery 6/20 -. On IV heparin gt, levophed gtt with midodrine. Off fent gtt. Pressopr needs down 6/26 - TCT tolerated for the past 24 hours  6/27 - Stable on TCT now >48hrs, tolerated first session of iHD  6/30 hypotension / bradycardia overnight, started on dopamine 7/2 Placed on ATC at 40% fiO2 7/6 Palliative care consult/ iHD 7/7 remains dependent on dopamine at 7, for bradycardia/ hypotension.   Palliative consult noted - family would like to visit 7/9 for goals of care meeting. 7/08 3.5 L off w/ iHD, weaned well all day, dopamine off late evening 7/9 great day, family meeting w/son and PMT, remains full code per patient 7/11 to ATC 7/13 remains on ATC 7/16 Trach change to 4 cuffless 7/17 Remains on 40% ATC 7/18 did tolerate swallowing yesterday  Consults:  Cardiology PCCM  Nephrology  EP  Procedures:  Lt PICC 5/29 >> 6/30 OETT 5/30 >> 6/3; 6/11 > 6/19 - TRACH (Dr Erskine Emery) >> R IJ HD cath 5/30 >> Aline left ulnar 5/30 >> 6/2 R nare cortrak 6/5 >> 7/13  Significant Diagnostic Tests:  TTE 5/28 >>The left ventricle has normal systolic function with an ejection fraction of 60-65%. . The right ventricle has normal systolic function. The cavity was mildly enlarged.   Right heart cath 5/29 RA = 24 RV = 94/26 PA = 95/36 (53) PCW = 30 (v = 50) Fick cardiac output/index = 6.0/2.7 PVR =3.9 WU FA sat = 91% PA sat = 58%, 58% SVC 60%  Micro Data:  SARS coronavirus 2 cepheid 5/28 >> neg MRSA PCR 5/28 >> neg Trach aspirate 6/2 >> MSSA BCx2 6/2 >> negative ...................... Tracheal aspirate 6/11 >>  few GNR >> few proteus and few candida tropicalis BCx2 6/11 >> negative 10/27/2018 tracheal aspirate>> gram-positive cocci gram-negative rods>>  Antimicrobials:  Vancomycin 6/3 > 6/4 Cefazolin 6/4 >> 6/10 ...........................Marland Kitchen Vanco 6/11 >> 6/17 Cefepime 6/11 >>6/18 Cefepime 10/27/2018>> 7/6  Interim  history/subjective:  She is afebrile, on trach collar Tolerated trach change well Sometimes secretions from trach Breathing feels about the same   Objective   Blood pressure (!) 108/39, pulse (!) 57, temperature 98.8 F (37.1 C), temperature source Oral, resp. rate 20, height 5' (1.524 m), weight 97 kg, SpO2 97 %.    FiO2 (%):  [40 %-60 %] 40 %   Intake/Output Summary (Last 24 hours) at 11/12/2018 1033 Last data filed at 11/12/2018 0506 Gross per 24 hour  Intake 1235.3 ml  Output 2500 ml  Net -1264.7 ml   Filed Weights   11/09/18 2009 11/11/18 1414 11/11/18 1724  Weight: 99.3 kg 99.3 kg 97 kg   Middle-aged lady, chronically ill Alert and oriented Denies any significant symptoms Bilateral excursion, some coarse breath sounds appreciated, tan secretions from trach S1-S2 appreciated Abdomen is soft, nontender Skin is warm and dry   Assessment & Plan:   Respiratory failure status post tracheostomy On trach 4.0 cuffless, tolerating it well -Keep saturations greater than 88% -Trach care per protocol -Clearing secretions well  Dialysis dependent renal failure -Continue per nephrology  Proteus colonization versus bronchitis -Completed course of cefepime -Trend fever and WBC -Reculture as needed  Persistent shock after PEA arrest Bradycardia Atrial fibrillation/history of atrial flutter Acute on chronic diastolic heart failure -Not a candidate for EP procedures -Follow-up with primary  Nutrition -Appears to have tolerated swallowing well -Consider PEG if not tolerating swallowing  Anemia Remains stable -Trend CBC Trend platelets Transfuse for more globin less than 7 -Consider apixaban if no surgical intervention is planned  Goals of care. -Full code  Dysphagia:  - Able to maintain  ATC at at FiO2 of 40% over night with 4.0 cuff less trach - Swallow pending   Best practice:  Diet: Continue tube feeds Pain/Anxiety/Delirium protocol (if indicated): NA  VAP protocol (if indicated): In place  DVT prophylaxis: On heparin GI prophylaxis: Pepcid Glucose control: SSI Mobility: PT/ OT as tolerated Code Status: Full Disposition: ICU    PCCM will follow for trach  PCCM will follow on Monday  Sherrilyn Rist, MD [place], PCCM Cell: [phone]

## 2018-11-13 LAB — GLUCOSE, CAPILLARY
Glucose-Capillary: 174 mg/dL — ABNORMAL HIGH (ref 70–99)
Glucose-Capillary: 200 mg/dL — ABNORMAL HIGH (ref 70–99)
Glucose-Capillary: 220 mg/dL — ABNORMAL HIGH (ref 70–99)
Glucose-Capillary: 123 mg/dL — ABNORMAL HIGH (ref 70–99)
Glucose-Capillary: 178 mg/dL — ABNORMAL HIGH (ref 70–99)

## 2018-11-13 LAB — RENAL FUNCTION PANEL
Albumin: 2.8 g/dL — ABNORMAL LOW (ref 3.5–5.0)
Anion gap: 10 (ref 5–15)
BUN: 27 mg/dL — ABNORMAL HIGH (ref 8–23)
CO2: 26 mmol/L (ref 22–32)
Calcium: 8.6 mg/dL — ABNORMAL LOW (ref 8.9–10.3)
Chloride: 97 mmol/L — ABNORMAL LOW (ref 98–111)
Creatinine, Ser: 2.87 mg/dL — ABNORMAL HIGH (ref 0.44–1.00)
GFR calc Af Amer: 19 mL/min — ABNORMAL LOW (ref >60)
GFR calc non Af Amer: 16 mL/min — ABNORMAL LOW (ref >60)
Glucose, Bld: 195 mg/dL — ABNORMAL HIGH (ref 70–99)
Phosphorus: 2.5 mg/dL (ref 2.5–4.6)
Potassium: 4.3 mmol/L (ref 3.5–5.1)
Sodium: 133 mmol/L — ABNORMAL LOW (ref 135–145)

## 2018-11-13 NOTE — Progress Notes (Signed)
[facility]    NEPHROLOGY PROGRESS NOTE  SUBJECTIVE: Awake and alert.  In good spirits.  Patient seen and examined.  Denies chest pain, shortness of breath, nausea, vomiting, diarrhea or dysuria.  All other review of systems are negative.  Underwent additional dialysis yesterday for fluid overload.   OBJECTIVE:  Vitals:   11/13/18 0900 11/13/18 1000  BP: (!) 117/101 (!) 121/49  Pulse: (!) 58 (!) 55  Resp: (!) 21 (!) 21  Temp:    SpO2: 100% 91%    Intake/Output Summary (Last 24 hours) at 11/13/2018 1103 Last data filed at 11/13/2018 0900 Gross per 24 hour  Intake 825 ml  Output 2601 ml  Net -1776 ml      General:  AAOx3 NAD HEENT: MMM [place] AT anicteric sclera Neck:  No JVD, no adenopathy, positive tracheostomy CV:  Heart RRR  Lungs:  L/S decreased at the bases. Abd:  abd SNT/ND with normal BS, obese GU:  Bladder non-palpable Extremities: Trace bilateral lower extremity edema. Skin:  No skin rash  MEDICATIONS:  . aspirin  81 mg Oral Daily  . atorvastatin  40 mg Per Tube q1800  . chlorhexidine gluconate (MEDLINE KIT)  15 mL Mouth Rinse BID  . Chlorhexidine Gluconate Cloth  6 each Topical Daily  . Chlorhexidine Gluconate Cloth  6 each Topical Daily  . darbepoetin (ARANESP) injection - NON-DIALYSIS  200 mcg Subcutaneous Q Wed-1800  . famotidine  20 mg Per Tube Daily  . Gerhardt's butt cream   Topical BID  . guaiFENesin  15 mL Per Tube Q6H  . insulin aspart  0-20 Units Subcutaneous Q4H  . insulin detemir  10 Units Subcutaneous QHS  . midodrine  10 mg Oral TID WC  . multivitamin  1 tablet Oral QHS  . polyethylene glycol  17 g Oral Daily  . sodium chloride flush  10-40 mL Intracatheter Q12H       LABS:   CBC Latest Ref Rng & Units 11/12/2018 11/11/2018 11/10/2018  WBC 4.0 - 10.5 K/uL 5.5 4.6 5.1  Hemoglobin 12.0 - 15.0 g/dL 8.4(L) 8.1(L) 8.6(L)  Hematocrit 36.0 - 46.0 % 28.8(L) 28.2(L) 29.2(L)  Platelets 150 - 400 K/uL 137(L) 145(L) 158    CMP Latest  Ref Rng & Units 11/13/2018 11/12/2018 11/11/2018  Glucose 70 - 99 mg/dL 195(H) 166(H) 195(H)  BUN 8 - 23 mg/dL 27(H) 37(H) 45(H)  Creatinine 0.44 - 1.00 mg/dL 2.87(H) 3.56(H) 4.36(H)  Sodium 135 - 145 mmol/L 133(L) 132(L) 132(L)  Potassium 3.5 - 5.1 mmol/L 4.3 4.2 3.8  Chloride 98 - 111 mmol/L 97(L) 97(L) 96(L)  CO2 22 - 32 mmol/L 26 25 26   Calcium 8.9 - 10.3 mg/dL 8.6(L) 8.5(L) 8.6(L)  Total Protein 6.5 - 8.1 g/dL - - -  Total Bilirubin 0.3 - 1.2 mg/dL - - -  Alkaline Phos 38 - 126 U/L - - -  AST 15 - 41 U/L - - -  ALT 0 - 44 U/L - - -    Lab Results  Component Value Date   PTH 33 10/12/2018   CALCIUM 8.6 (L) 11/13/2018   CAION 1.23 10/24/2018   PHOS 2.5 11/13/2018       Component Value Date/Time   COLORURINE AMBER (A) 05/22/2013 1826   APPEARANCEUR HAZY (A) 05/22/2013 1826   LABSPEC 1.024 05/22/2013 1826   PHURINE 6.0 05/22/2013 1826   GLUCOSEU NEGATIVE 05/22/2013 1826   HGBUR NEGATIVE 05/22/2013 1826   BILIRUBINUR SMALL (A) 05/22/2013 1826   KETONESUR NEGATIVE 05/22/2013  1826   PROTEINUR 100 (A) 05/22/2013 1826   UROBILINOGEN 4.0 (H) 05/22/2013 1826   NITRITE NEGATIVE 05/22/2013 1826   LEUKOCYTESUR NEGATIVE 05/22/2013 1826      Component Value Date/Time   PHART 7.320 (L) 10/24/2018 0952   PCO2ART 48.7 (H) 10/24/2018 0952   PO2ART 69.0 (L) 10/24/2018 0952   HCO3 25.2 10/24/2018 0952   TCO2 27 10/24/2018 0952   ACIDBASEDEF 1.0 10/24/2018 0952   O2SAT 92.0 10/24/2018 0952       Component Value Date/Time   IRON 44 10/12/2018 1640   TIBC 379 10/12/2018 1640   IRONPCTSAT 12 10/12/2018 1640       ASSESSMENT/PLAN:    68 year old female patient with a past medical history significant for chronic kidney disease stage IV with a baseline creatinine of 1.7-1.9 who presented with ATN secondary to shock, chronic respiratory failure, Proteus in the sputum, and acute on chronic diastolic heart failure.  1.  Acute kidney injury Superimposed on CKD IV (baseline 1.7-1.9) likely  ATN secondary to shock, anuric: Transitioned to iHD on 6/27. Using right [facility] and on MWF schedule.  She is on midodrine for hypotension at 20 TID and florinef 0.2 mg daily added 7/4.  We will hold Florinef for now given fluid overload.  No signs of recovery at present.  Continue supportive care with dialysis at this present time. HD MWF.  Vascular to place fistula probably Tuesday.  Phos stable at 2.8  2.  Proteus +respiratory culture: s/p cefepime, ended 7/8 per PCCM.    3.  Chronic respiratory failure: Status post tracheostomy. Currently on vent, per pulmonary, weaning as tolerated. Now TC.  4.  Acute on chronic diastolic heart failure: Appreciate the expertise of the heart failure team.  Plans to try to correlate bradycardia with hypotension to see if could/ would be a candidate for TVP/ RV micro device--> not a candidate for these interventions per notes; not felt to be a candidate for PPM per EP team.   5.  Anemia due to chronic disease and acute blood loss: Continue ESA, Increase Aranesp to 200 mcg for 7/15, status post.  Hb stable in mid 8s.   6.  Bradycardia, PEA cardiac arrest/ Afib :  As above in #4. On hep gtt as well.  7.  Dispo:  Palliative care discussion noted and greatly appreciated.    Plan is for LTAC.      [place], DO, [place]

## 2018-11-13 NOTE — Progress Notes (Signed)
Trach care done per RRT,  No distress or complications noted. Pt tolerated it well

## 2018-11-13 NOTE — Progress Notes (Signed)
  Speech Language Pathology Treatment: Dysphagia;Passy Muir Speaking valve  Patient Details Name: Kathleen [name] MRN: [id number] DOB: Apr 27, 1951 Today's Date: 11/13/2018 Time: 5701-7793 SLP Time Calculation (min) (ACUTE ONLY): 19 min  Assessment / Plan / Recommendation Clinical Impression  SLP provided skilled observation during breakfast meal with diet initiated after FEES. PMV was in place upon arrival, and was worn throughout session without overt signs of intolerance. Her voice remains hoarse and soft, and breath support is reduced for even phrase level. SLP provided Min-Mod cues for chunking to better maintain breath support for improved intelligibility. While eating her meal, pt had occasional delayed coughing but with coughing that also ensued after meal completion when SLP removed PMV prior to departure, given recommendation for supervised use only at this time. Pt endorses a h/o GER which could be contributing. Baseline secretions could also be playing a role, but she does not have as much coughing or audible wetness as was previously noted before her trach change. Recommend continuing current diet and precautions with additional SLP f/u warranted for safety and progression.   HPI HPI: 68 y.o. female admitted 09/22/2018 for surgery. PMH: permanent atrial flutter with baseline bradycardia, hypertension, super morbid obesity, IIDDM, dyslipidemia, obesity hypoventilation syndrome, CKD stage IV, chronic diastolic CHF, chronic edema. PEA 09/24/2018. Intubated 5/30-09/28/2018, again 6/11until trach 6/19. SLP was seeing pt in between intubations with concern for post-extubation dysphagia and instrumental testing recommended, although this was not completed as pt had been reintubated.      SLP Plan  Continue with current plan of care       Recommendations  Diet recommendations: Dysphagia 2 (fine chop);Thin liquid Liquids provided via: Cup;Straw Medication Administration: Whole meds with  puree Supervision: Staff to assist with self feeding;Full supervision/cueing for compensatory strategies Compensations: Slow rate;Small sips/bites;Minimize environmental distractions Postural Changes and/or Swallow Maneuvers: Seated upright 90 degrees;Upright 30-60 min after meal      Patient may use Passy-Muir Speech Valve: During all therapies with supervision;During PO intake/meals PMSV Supervision: Full         Oral Care Recommendations: Oral care BID Follow up Recommendations: LTACH SLP Visit Diagnosis: Aphonia (R49.1);Dysphagia, unspecified (R13.10) Plan: Continue with current plan of care       [place] Jared Whorley 11/13/2018, 9:27 AM  Pollyann Glen, M.A. [place] Acute Environmental education officer [phone] Office [phone]

## 2018-11-13 NOTE — Progress Notes (Signed)
S: pt seen briefly this morning. Diuresed, CVV yesterday -2L. Interactive this a.m. reports breathing feels much better. Ate w/o problem this morning. Trach collar this morning O:  Vitals:   11/13/18 0900 11/13/18 1000  BP: (!) 117/101 (!) 121/49  Pulse: (!) 58 (!) 55  Resp: (!) 21 (!) 21  Temp:    SpO2: 100% 91%   A: resp failure, s/p trach; renal disease with resp seemingly dependent on fluid status. PLAN: cap trach on Monday if able

## 2018-11-13 NOTE — Progress Notes (Signed)
PROGRESS NOTE    Kathleen [name]  QQP:[id number] DOB: 04-May-1950 DOA: 09/22/2018 PCP: Aldine Contes, MD    Brief Narrative:  68 year old female presented to the outpatient cardiology clinic for follow-up. She does have significant past medical history for permanent atrial fibrillation, hypertension, morbid obesity, type 2 diabetes mellitus, dyslipidemia, obesity hypoventilation, chronic kidney disease stage V, anddiastolic heart failure. She reported orthopnea, increased dyspnea on exertion, and increased lower extremity edema. Patient failed for outpatient diuretic management, and she was directly admitted to the hospital for further evaluation.  She had a prolonged hospitalization. Patient was initially placed on milrinone and furosemide. May 30 she suffered from a cardiac arrest, pulseless electrical activity. She recovered spontaneous circulation after 8 minutes of CPR. She was intubated and supported with mechanical ventilation.  She had worsening kidney function required CRRT May 31. She was extubated June 3, and reintubated June 11 for hypercapnic respiratory failure. She remainedventilator dependent respiratory failureand on June 19 underwent tracheostomy.  She required vasopressors and inotropes for hemodynamic decompensation.  She was transferred to [facility] on July 12.  11/10/18 Trach changed to cuffless #4, patient has been of dopamine for the last 24 H.  11/11/18 Transferred to progressive care unit.  Patient has passed her swallow evaluation, trach changed to #4 cuffless, diet has been advanced.    Assessment & Plan:   Active Problems:   Type 2 diabetes mellitus with other specified complication (HCC)   Dyslipidemia   OBESITY   Acute on chronic diastolic CHF (congestive heart failure) (HCC)   Acute kidney injury superimposed on chronic kidney disease (HCC)   Acute respiratory failure with hypoxia (HCC)   VAP (ventilator-associated pneumonia) (HCC)  Shock ([place])   Acute respiratory failure (HCC)   CHF (congestive heart failure) ([place])   Palliative care encounter   Dysphagia   Tracheostomy status ([place])   1.Acute respiratory failure, hypoxic andhypercapnic. off mechanical ventilation since June 11.Patient had emergent HD last night for volume overload, pulmonary edema, has 2,500 ml of ultrafiltration with significant improvement of her symptoms. Now Fi02 down to 60% from 100%. Improved lung examination. Continue to decrease Fi02 to target 02 saturation greater than 92%. Continue ultrafiltration per nephrology recommendations.   Will transfer patient to progressive care.   2. Diastolic heart failure with hypotension.Her blood pressure has remained stable this am with systolic 712 to 458 mmHg. Will continue midodrine. Will dc fludrocortisone for now.   3. Stage V CKD due to ATN, related to shock.Transitioned to regular HD on June 27, getting HD M-W-F. Improved volume status has improved after ultrafiltration. K this am is 4,3 and serum bicarbonate at 26. Follow vascular surgery recommendations for permanent HD access.   4. Swallow dysfunction. Improved appetite this am, continue to tolerate by mouth feedings, will discontinue feeding tube.   5. Chronic atrial fibrillation.Heart rate has remained well controlled, will continue telemetry monitoring, holding heparin due to vaginal bleeding.   6. T2DM. Am glucose 195 mg/dl, improved appetite, discontinue tube feedings. On basal insulin therapy with 10 units of detemir,plus insulin sliding scale.   7. Chronic anemia with iron deficiency. Check cell count as needed.   8. Morbid obesitywith dysplidemia. Calculated [id number]. Continue with atorvastatin.  9. Ventilator associated pneumonia (not present on admission). Proteus. Completed antibiotic therapy on 07/08.Cuffless trach #4, Continue trach care per protocol. Positive decannulation this admission.   DVT  prophylaxis:scd Code Status:full Family Communication:No family at the bedside Disposition Plan/ discharge barriers:LATC or SNF pending on patient's respiratory progress.  Body mass index is 44.86 kg/m. Malnutrition Type:  Nutrition Problem: Increased nutrient needs Etiology: wound healing   Malnutrition Characteristics:  Signs/Symptoms: estimated needs   Nutrition Interventions:  Interventions: Tube feeding, Prostat, MVI, Magic cup  RN Pressure Injury Documentation:   Consultants:   Nephrology   Pulmonary   Procedures:     Antimicrobials: Trach  Subjective: Patient is feeling better, significant improvement of dyspnea, post ultrafiltration, this am her appetite has improved, no nausea or vomiting.   Objective: Vitals:   11/13/18 0500 11/13/18 0600 11/13/18 0700 11/13/18 0758  BP: (!) 109/43 (!) 121/47 (!) 112/48   Pulse: (!) 52 (!) 53 (!) 54 60  Resp: (!) 24 (!) 25 (!) 21 (!) 21  Temp:   98.9 F (37.2 C)   TempSrc:   Oral   SpO2: 100% 100% 99% 99%  Weight: 104.2 kg     Height:        Intake/Output Summary (Last 24 hours) at 11/13/2018 0814 Last data filed at 11/13/2018 0700 Gross per 24 hour  Intake 480 ml  Output 2601 ml  Net -2121 ml   Filed Weights   11/11/18 1414 11/11/18 1724 11/13/18 0500  Weight: 99.3 kg 97 kg 104.2 kg    Examination:   General: deconditioned  Neurology: Awake and alert, non focal  E ENT: no pallor, no icterus, oral mucosa moist/ trach in place.  Cardiovascular: No JVD. S1-S2 present, rhythmic, no gallops, rubs, or murmurs. Trace lower extremity edema. Pulmonary: positive breath sounds bilaterally, a no wheezing, rhonchi or rales. Mild decreased breath sounds at bases.  Gastrointestinal. Abdomen protuberant no organomegaly, non tender, no rebound or guarding Skin. No rashes Musculoskeletal: no joint deformities     Data Reviewed: I have personally reviewed following labs and imaging studies  CBC:  Recent Labs  Lab 11/08/18 0522 11/09/18 1727 11/10/18 0502 11/11/18 0413 11/12/18 0447  WBC 4.8 5.6 5.1 4.6 5.5  HGB 8.8* 8.6* 8.6* 8.1* 8.4*  HCT 30.3* 29.2* 29.2* 28.2* 28.8*  MCV 98.4 97.0 99.3 97.2 98.6  PLT 186 158 158 145* 546*   Basic Metabolic Panel: Recent Labs  Lab 11/09/18 0259 11/10/18 0502 11/11/18 0413 11/12/18 0447 11/13/18 0511  NA 133* 133* 132* 132* 133*  K 4.9 3.9 3.8 4.2 4.3  CL 96* 95* 96* 97* 97*  CO2 _0 GLUCOSE 142* 189* 195* 166* 195*  BUN 50* 31* 45* 37* 27*  CREATININE 5.00* 3.34* 4.36* 3.56* 2.87*  CALCIUM 9.1 8.3* 8.6* 8.5* 8.6*  MG  --  2.2  --  2.2  --   PHOS 5.6* 3.3 4.4 2.8 2.5   GFR: Estimated Creatinine Clearance: 20.7 mL/min (A) (by C-G formula based on SCr of 2.87 mg/dL (H)). Liver Function Tests: Recent Labs  Lab 11/09/18 0259 11/10/18 0502 11/11/18 0413 11/12/18 0447 11/13/18 0511  ALBUMIN 2.7* 2.4* 2.3* 2.7* 2.8*   No results for input(s): LIPASE, AMYLASE in the last 168 hours. No results for input(s): AMMONIA in the last 168 hours. Coagulation Profile: No results for input(s): INR, PROTIME in the last 168 hours. Cardiac Enzymes: No results for input(s): CKTOTAL, CKMB, CKMBINDEX, TROPONINI in the last 168 hours. BNP (last 3 results) No results for input(s): PROBNP in the last 8760 hours. HbA1C: No results for input(s): HGBA1C in the last 72 hours. CBG: Recent Labs  Lab 11/12/18 1612 11/12/18 1746 11/12/18 1953 11/12/18 2317 11/13/18 0330  GLUCAP 236* 200* 153* 111* 174*   Lipid Profile: No results  for input(s): CHOL, HDL, LDLCALC, TRIG, CHOLHDL, LDLDIRECT in the last 72 hours. Thyroid Function Tests: No results for input(s): TSH, T4TOTAL, FREET4, T3FREE, THYROIDAB in the last 72 hours. Anemia Panel: No results for input(s): VITAMINB12, FOLATE, FERRITIN, TIBC, IRON, RETICCTPCT in the last 72 hours.    Radiology Studies: I have reviewed all of the imaging during this hospital visit personally      Scheduled Meds: . aspirin  81 mg Oral Daily  . atorvastatin  40 mg Per Tube q1800  . chlorhexidine gluconate (MEDLINE KIT)  15 mL Mouth Rinse BID  . Chlorhexidine Gluconate Cloth  6 each Topical Daily  . Chlorhexidine Gluconate Cloth  6 each Topical Daily  . darbepoetin (ARANESP) injection - NON-DIALYSIS  200 mcg Subcutaneous Q Wed-1800  . famotidine  20 mg Per Tube Daily  . feeding supplement (PRO-STAT SUGAR FREE 64)  30 mL Per Tube TID  . fludrocortisone  0.2 mg Oral Daily  . Gerhardt's butt cream   Topical BID  . guaiFENesin  15 mL Per Tube Q6H  . insulin aspart  0-20 Units Subcutaneous Q4H  . insulin detemir  10 Units Subcutaneous QHS  . midodrine  10 mg Oral TID WC  . multivitamin  1 tablet Oral QHS  . polyethylene glycol  17 g Oral Daily  . sodium chloride flush  10-40 mL Intracatheter Q12H   Continuous Infusions: . sodium chloride 250 mL (10/31/18 2358)  . feeding supplement (OSMOLITE 1.5 CAL) 1,000 mL (11/12/18 2141)     LOS: 52 days         Gerome Apley, MD

## 2018-11-13 NOTE — Progress Notes (Signed)
Spoke w/daughter-in-law, [name], and updated her on pt's status after providing password... Reports she did not know pt was back in CVICU.... Would like to be updated when pt moves to another floor  Answered all of her questions... she verb understanding and had no further questions.

## 2018-11-14 LAB — CBC
HCT: 29.4 % — ABNORMAL LOW (ref 36.0–46.0)
Hemoglobin: 8.5 g/dL — ABNORMAL LOW (ref 12.0–15.0)
MCH: 29.5 pg (ref 26.0–34.0)
MCHC: 28.9 g/dL — ABNORMAL LOW (ref 30.0–36.0)
MCV: 102.1 fL — ABNORMAL HIGH (ref 80.0–100.0)
Platelets: 108 10*3/uL — ABNORMAL LOW (ref 150–400)
RBC: 2.88 MIL/uL — ABNORMAL LOW (ref 3.87–5.11)
RDW: 26.7 % — ABNORMAL HIGH (ref 11.5–15.5)
WBC: 7.7 10*3/uL (ref 4.0–10.5)
nRBC: 0.3 % — ABNORMAL HIGH (ref 0.0–0.2)

## 2018-11-14 LAB — RENAL FUNCTION PANEL
Albumin: 2.7 g/dL — ABNORMAL LOW (ref 3.5–5.0)
Anion gap: 12 (ref 5–15)
BUN: 42 mg/dL — ABNORMAL HIGH (ref 8–23)
CO2: 24 mmol/L (ref 22–32)
Calcium: 9.1 mg/dL (ref 8.9–10.3)
Chloride: 93 mmol/L — ABNORMAL LOW (ref 98–111)
Creatinine, Ser: 3.95 mg/dL — ABNORMAL HIGH (ref 0.44–1.00)
GFR calc Af Amer: 13 mL/min — ABNORMAL LOW (ref >60)
GFR calc non Af Amer: 11 mL/min — ABNORMAL LOW (ref >60)
Glucose, Bld: 128 mg/dL — ABNORMAL HIGH (ref 70–99)
Phosphorus: 3 mg/dL (ref 2.5–4.6)
Potassium: 4.9 mmol/L (ref 3.5–5.1)
Sodium: 129 mmol/L — ABNORMAL LOW (ref 135–145)

## 2018-11-14 LAB — GLUCOSE, CAPILLARY
Glucose-Capillary: 120 mg/dL — ABNORMAL HIGH (ref 70–99)
Glucose-Capillary: 127 mg/dL — ABNORMAL HIGH (ref 70–99)
Glucose-Capillary: 131 mg/dL — ABNORMAL HIGH (ref 70–99)
Glucose-Capillary: 243 mg/dL — ABNORMAL HIGH (ref 70–99)
Glucose-Capillary: 253 mg/dL — ABNORMAL HIGH (ref 70–99)
Glucose-Capillary: 99 mg/dL (ref 70–99)

## 2018-11-14 LAB — SURGICAL PCR SCREEN
MRSA, PCR: NEGATIVE
Staphylococcus aureus: POSITIVE — AB

## 2018-11-14 MED ORDER — ZINC SULFATE 220 (50 ZN) MG PO CAPS
220.0000 mg | ORAL_CAPSULE | Freq: Every day | ORAL | Status: DC
  Administered 2018-11-14 – 2018-11-17 (×3): 220 mg via ORAL
  Filled 2018-11-14 (×4): qty 1

## 2018-11-14 MED ORDER — HEPARIN SODIUM (PORCINE) 1000 UNIT/ML IJ SOLN
INTRAMUSCULAR | Status: AC
  Administered 2018-11-14: 3800 [IU] via INTRAVENOUS_CENTRAL
  Filled 2018-11-14: qty 4

## 2018-11-14 MED ORDER — SODIUM CHLORIDE 0.9 % IV SOLN: 100.0000 mL | INTRAVENOUS | Status: DC | PRN

## 2018-11-14 MED ORDER — CEFAZOLIN SODIUM-DEXTROSE 2-4 GM/100ML-% IV SOLN
2.0000 g | INTRAVENOUS | Status: AC
  Filled 2018-11-14: qty 100

## 2018-11-14 MED ORDER — VITAMIN C 500 MG PO TABS
500.0000 mg | ORAL_TABLET | Freq: Two times a day (BID) | ORAL | Status: DC
  Administered 2018-11-14 – 2018-11-17 (×7): 500 mg via ORAL
  Filled 2018-11-14 (×7): qty 1

## 2018-11-14 MED ORDER — PRO-STAT SUGAR FREE PO LIQD
30.0000 mL | Freq: Two times a day (BID) | ORAL | Status: DC
  Administered 2018-11-14 – 2018-11-17 (×5): 30 mL via ORAL
  Filled 2018-11-14 (×6): qty 30

## 2018-11-14 MED ORDER — ALTEPLASE 2 MG IJ SOLR: 2.0000 mg | Freq: Once | INTRAMUSCULAR | Status: DC | PRN

## 2018-11-14 MED ORDER — HEPARIN SODIUM (PORCINE) 1000 UNIT/ML DIALYSIS
1000.0000 [IU] | INTRAMUSCULAR | Status: DC | PRN
  Administered 2018-11-14: 3800 [IU] via INTRAVENOUS_CENTRAL

## 2018-11-14 NOTE — Progress Notes (Signed)
TRansported  to dialysis unit awake and alert.

## 2018-11-14 NOTE — Progress Notes (Addendum)
PROGRESS NOTE    Kathleen [name]  BMW:[id number] DOB: 01-May-1950 DOA: 09/22/2018 PCP: Aldine Contes, MD    Brief Narrative:  68 year old female presented to the outpatient cardiology clinic for follow-up. She does have significant past medical history for permanent atrial fibrillation, hypertension, morbid obesity, type 2 diabetes mellitus, dyslipidemia, obesity hypoventilation, chronic kidney disease stage V, anddiastolic heart failure. She reported orthopnea, increased dyspnea on exertion, and increased lower extremity edema. Patient failed for outpatient diuretic management, and she was directly admitted to the hospital for further evaluation.  She had a prolonged hospitalization. Patient was initially placed on milrinone and furosemide. May 30 she suffered from a cardiac arrest, pulseless electrical activity. She recovered spontaneous circulation after 8 minutes of CPR. She was intubated and supported with mechanical ventilation.  She had worsening kidney function required CRRT May 31. She was extubated June 3, and reintubated June 11 for hypercapnic respiratory failure. She remainedventilator dependent respiratory failureand on June 19 underwent tracheostomy.  She required vasopressors and inotropes for hemodynamic decompensation.  She was transferred to [facility] on July 12.  11/10/18 Trach changed to cuffless #4, patient has been of dopamine for the last 24 H.  11/11/18 Transferred to progressive care unit.  Patient has passed her swallow evaluation, trach changed to #4 cuffless, diet has been advanced.   Plan for permanent access for HD, AV fistula in am.    Assessment & Plan:   Active Problems:   Type 2 diabetes mellitus with other specified complication (HCC)   Dyslipidemia   OBESITY   Acute on chronic diastolic CHF (congestive heart failure) (HCC)   Acute kidney injury superimposed on chronic kidney disease (HCC)   Acute respiratory failure with hypoxia  (HCC)   VAP (ventilator-associated pneumonia) (HCC)   Shock ([place])   Acute respiratory failure (HCC)   CHF (congestive heart failure) ([place])   Palliative care encounter   Dysphagia   Tracheostomy status ([place])   1.Acute respiratory failure, hypoxic andhypercapnic. off mechanical ventilation since June 11.Improved volume status this am, dyspnea has improved, decreased Fi02 from 60% down to 40%. For HD today and ultrafiltration. Now with uncuffed trach and tolerating well speech therapy.   2. Diastolic heart failure with hypotension/ LV EF 60 to 65%).Continue midodrine for hypotension, ultrafiltration on HD for volume control.   3. Stage V CKD due to ATN, related to shock.Transitioned to regular HD on June 27, getting HD M-W-F. Continue renal replacement therapy with HD, plan for permanent access in am, per vascular surgery.  4. Swallow dysfunction.Continue po intake with good toleration, no need for gastric tube.   5. Chronic atrial fibrillation.Continue rate controlled fibrillation, holding on heparin due to vaginal bleeding over the weekend, will plan to resume anticoagulation after vascular access procedure.  6. T2DM. Glucose has remained stable, fasting this am at 128 mg/dl. Continue with basal insulin 10 units of detemir,plus insulin sliding scale.   7. Chronic anemia with iron deficiency. Hgb today at 8,5 with Hct at 29, platelets at 108.  8. Morbid obesitywith dysplidemia.  [id number].Onatorvastatin.  9. Ventilator associated pneumonia (not present on admission). Proteus. Completed antibiotic therapy on 07/08.Cuffless trach #4,Tolerating well trach collar. Cap trial for possible decannulation in am.   DVT prophylaxis:scd Code Status:full Family Communication:No family at the bedside Disposition Plan/ discharge barriers: SNF pending on patient.   Body mass index is 45.42 kg/m. Malnutrition Type:  Nutrition Problem: Increased nutrient needs  Etiology: wound healing   Malnutrition Characteristics:  Signs/Symptoms: estimated needs   Nutrition  Interventions:  Interventions: Magic cup, MVI, Prostat  RN Pressure Injury Documentation:    Consultants:  Nephrology   Pulmonary  Procedures:  Trach  Subjective: Patient is feeling well this am, continue to tolerate well trach collar, with Fi02 60%, no chest pain, no nausea or vomiting, NG tube has removed and patient tolerating well po intake.   Objective: Vitals:   11/14/18 0358 11/14/18 0452 11/14/18 0750 11/14/18 0751  BP:   (!) 114/38 (!) 114/38  Pulse:   (!) 44 (!) 50  Resp:   (!) 21 19  Temp:   97.9 F (36.6 C)   TempSrc:   Oral   SpO2: 96% 95% 100% 100%  Weight:      Height:       No intake or output data in the 24 hours ending 11/14/18 0941 Filed Weights   11/11/18 1724 11/13/18 0500 11/14/18 0046  Weight: 97 kg 104.2 kg 105.5 kg    Examination:   General: deconditioned  Neurology: Awake and alert, non focal  E ENT: mild pallor, no icterus, oral mucosa moist/ trach in place.  Cardiovascular: No JVD. S1-S2 present, rhythmic, no gallops, rubs, or murmurs. No lower extremity edema. Pulmonary: positive breath sounds bilaterally, adequate air movement, no wheezing, rhonchi or rales. Gastrointestinal. Abdomen with, no organomegaly, non tender, no rebound or guarding Skin. No rashes Musculoskeletal: no joint deformities     Data Reviewed: I have personally reviewed following labs and imaging studies  CBC: Recent Labs  Lab 11/08/18 0522 11/09/18 1727 11/10/18 0502 11/11/18 0413 11/12/18 0447  WBC 4.8 5.6 5.1 4.6 5.5  HGB 8.8* 8.6* 8.6* 8.1* 8.4*  HCT 30.3* 29.2* 29.2* 28.2* 28.8*  MCV 98.4 97.0 99.3 97.2 98.6  PLT 186 158 158 145* 712*   Basic Metabolic Panel: Recent Labs  Lab 11/10/18 0502 11/11/18 0413 11/12/18 0447 11/13/18 0511 11/14/18 0415  NA 133* 132* 132* 133* 129*  K 3.9 3.8 4.2 4.3 4.9  CL 95* 96* 97* 97* 93*   CO2 _0 GLUCOSE 189* 195* 166* 195* 128*  BUN 31* 45* 37* 27* 42*  CREATININE 3.34* 4.36* 3.56* 2.87* 3.95*  CALCIUM 8.3* 8.6* 8.5* 8.6* 9.1  MG 2.2  --  2.2  --   --   PHOS 3.3 4.4 2.8 2.5 3.0   GFR: Estimated Creatinine Clearance: 15.2 mL/min (A) (by C-G formula based on SCr of 3.95 mg/dL (H)). Liver Function Tests: Recent Labs  Lab 11/10/18 0502 11/11/18 0413 11/12/18 0447 11/13/18 0511 11/14/18 0415  ALBUMIN 2.4* 2.3* 2.7* 2.8* 2.7*   No results for input(s): LIPASE, AMYLASE in the last 168 hours. No results for input(s): AMMONIA in the last 168 hours. Coagulation Profile: No results for input(s): INR, PROTIME in the last 168 hours. Cardiac Enzymes: No results for input(s): CKTOTAL, CKMB, CKMBINDEX, TROPONINI in the last 168 hours. BNP (last 3 results) No results for input(s): PROBNP in the last 8760 hours. HbA1C: No results for input(s): HGBA1C in the last 72 hours. CBG: Recent Labs  Lab 11/13/18 1622 11/13/18 2013 11/14/18 0030 11/14/18 0336 11/14/18 0837  GLUCAP 123* 178* 120* 127* 99   Lipid Profile: No results for input(s): CHOL, HDL, LDLCALC, TRIG, CHOLHDL, LDLDIRECT in the last 72 hours. Thyroid Function Tests: No results for input(s): TSH, T4TOTAL, FREET4, T3FREE, THYROIDAB in the last 72 hours. Anemia Panel: No results for input(s): VITAMINB12, FOLATE, FERRITIN, TIBC, IRON, RETICCTPCT in the last 72 hours.    Radiology Studies: I have reviewed  all of the imaging during this hospital visit personally     Scheduled Meds: . aspirin  81 mg Oral Daily  . atorvastatin  40 mg Per Tube q1800  . chlorhexidine gluconate (MEDLINE KIT)  15 mL Mouth Rinse BID  . Chlorhexidine Gluconate Cloth  6 each Topical Daily  . Chlorhexidine Gluconate Cloth  6 each Topical Daily  . darbepoetin (ARANESP) injection - NON-DIALYSIS  200 mcg Subcutaneous Q Wed-1800  . famotidine  20 mg Per Tube Daily  . Gerhardt's butt cream   Topical BID  . guaiFENesin  15  mL Per Tube Q6H  . insulin aspart  0-20 Units Subcutaneous Q4H  . insulin detemir  10 Units Subcutaneous QHS  . midodrine  10 mg Oral TID WC  . multivitamin  1 tablet Oral QHS  . polyethylene glycol  17 g Oral Daily  . sodium chloride flush  10-40 mL Intracatheter Q12H  . vitamin C  500 mg Oral BID  . zinc sulfate  220 mg Oral Daily   Continuous Infusions: . sodium chloride 250 mL (10/31/18 2358)  . [START ON 11/15/2018]  ceFAZolin (ANCEF) IV       LOS: 53 days        Add Dinapoli Gerome Apley, MD

## 2018-11-14 NOTE — Progress Notes (Signed)
Plan for left arm AVF tomorrow with Dr. Donzetta Matters.  Please keep NPO after midnight.  Restrict left arm.  Call vascular with questions or concerns.  Marty Heck, MD [facility] Office: [phone] Pager: [place]

## 2018-11-14 NOTE — Progress Notes (Signed)
Back  From hemo. Unit by bed wake and alert.

## 2018-11-14 NOTE — Progress Notes (Signed)
Physical Therapy Treatment Patient Details Name: Kathleen [name] MRN: [id number] DOB: 01-14-1951 Today's Date: 11/14/2018    History of Present Illness Kathleen [name] is a 68 y.o. female with history of permanent AF, hypertension, super morbid obesity, DM2, dyslipidemia, obesity hypoventilation syndrome (on home O2 with no OSA by prior PSG but showed nocturnal hypoxemia), CKD stage IV (baseline creatinine about 1.9), chronic diastolic CHF.  Admit for fluid overload. Also with pulmonary HTN.  PEA arrest 5/30, transfer to the ICU, extubater 6/3.  Intubated 10/06/18.  Trach on 10/14/18.      PT Comments    Pt admitted with above diagnosis. Pt currently with functional limitations due to the deficits listed below (see PT Problem List). Pt was able to take a few steps with a RW today.  Making progress and needs max motivation by PT to progress.  Pt met 2/5 goals.  Goals revised.  Will progress pt as able.  Pt will benefit from skilled PT to increase their independence and safety with mobility to allow discharge to the venue listed below.     Follow Up Recommendations  LTACH;Supervision/Assistance - 24 hour(SNF possibly if pt is decannulated 7/21)     Equipment Recommendations  Other (comment)(TBA)    Recommendations for Other Services       Precautions / Restrictions Precautions Precautions: Fall Precaution Comments: pt on trach collar  Restrictions Weight Bearing Restrictions: No    Mobility  Bed Mobility Overal bed mobility: Needs Assistance Bed Mobility: Sidelying to Sit;Rolling Rolling: Supervision Sidelying to sit: Min assist       General bed mobility comments: pt able to complete supine to sidelying with cueing only, transition to sitting with min assist for trunk support  Transfers Overall transfer level: Needs assistance Equipment used: Rolling walker (2 wheeled) Transfers: Sit to/from [facility] Sit to Stand: Max assist;+2 physical assistance;+2  safety/equipment Stand pivot transfers: Max assist;+2 physical assistance;+2 safety/equipment       General transfer comment: sit to stand from EOB with max cueing for forward scoot, hand placement and technique; pt with poor carryover of hand placement (until attempted from recliner with arm rests after visual demonstration of technique); poor intation of steps during pivot requiring multimodal cueing with posterior lean and flexed posture as well and pt does not folllow commands consistently.   Ambulation/Gait                 Stairs             Wheelchair Mobility    Modified Rankin (Stroke Patients Only)       Balance Overall balance assessment: Needs assistance Sitting-balance support: Feet unsupported;No upper extremity supported Sitting balance-Leahy Scale: Fair Sitting balance - Comments: close supervision statically    Standing balance support: Bilateral upper extremity supported;During functional activity Standing balance-Leahy Scale: Poor Standing balance comment: reliant on BUE and external support                            Cognition Arousal/Alertness: Awake/alert Behavior During Therapy: Anxious Overall Cognitive Status: Difficult to assess                                 General Comments: used PMSV to commuincatie, able to mouth words without--fatigues easily       Exercises General Exercises - Lower Extremity Long Arc Quad: AROM;Both;10 reps;Seated    General  Comments General comments (skin integrity, edema, etc.): 60% FiO2 at rest,  trach collar setup only goes up to 55% FiO2 therefore placed pt on 55% for transfer/mobility.       Pertinent Vitals/Pain Pain Assessment: Faces Faces Pain Scale: No hurt    Home Living                      Prior Function            PT Goals (current goals can now be found in the care plan section) Acute Rehab PT Goals Patient Stated Goal: to go home PT Goal  Formulation: With patient Time For Goal Achievement: 11/28/18 Potential to Achieve Goals: Good Progress towards PT goals: Progressing toward goals    Frequency    Min 3X/week      PT Plan Current plan remains appropriate    Co-evaluation PT/OT/SLP Co-Evaluation/Treatment: Yes Reason for Co-Treatment: Complexity of the patient's impairments (multi-system involvement);For patient/therapist safety PT goals addressed during session: Mobility/safety with mobility OT goals addressed during session: ADL's and self-care      AM-PAC PT "6 Clicks" Mobility   Outcome Measure  Help needed turning from your back to your side while in a flat bed without using bedrails?: A Lot Help needed moving from lying on your back to sitting on the side of a flat bed without using bedrails?: A Lot Help needed moving to and from a bed to a chair (including a wheelchair)?: A Lot   Help needed to walk in hospital room?: Total Help needed climbing 3-5 steps with a railing? : Total 6 Click Score: 8    End of Session Equipment Utilized During Treatment: Oxygen;Gait belt(trach collar) Activity Tolerance: Patient limited by fatigue Patient left: with call bell/phone within reach;in chair;with chair alarm set Nurse Communication: Mobility status PT Visit Diagnosis: Other abnormalities of gait and mobility (R26.89);Muscle weakness (generalized) (M62.81)     Time: 5612-5483 PT Time Calculation (min) (ACUTE ONLY): 30 min  Charges:  $Therapeutic Activity: 8-22 mins                     [place] Pager:  [phone]  Office:  [place] 11/14/2018, 11:44 AM

## 2018-11-14 NOTE — Progress Notes (Addendum)
Occupational Therapy Treatment Patient Details Name: Kathleen [name] MRN: [id number] DOB: Mar 07, 1951 Today's Date: 11/14/2018    History of present illness Kathleen [name] is a 68 y.o. female with history of permanent AF, hypertension, super morbid obesity, DM2, dyslipidemia, obesity hypoventilation syndrome (on home O2 with no OSA by prior PSG but showed nocturnal hypoxemia), CKD stage IV (baseline creatinine about 1.9), chronic diastolic CHF.  Admit for fluid overload. Also with pulmonary HTN.  PEA arrest 5/30, transfer to the ICU, extubater 6/3.  Intubated 10/06/18.  Trach on 10/14/18.     OT comments  Patient supine in bed and agreeable to OT/PT session. Completed bed mobility with min assist for trunk support, used RW for transfers today- max assist +2 for sit to stand from EOB and recliner with max cueing for hand placement, sequencing and technique.  Pt anxious with mobility, inconsistently following multiple step commands.  Wore PMSV during session, tolerated well.  Updated goals. Plan for further cognitive assessment next session.    Follow Up Recommendations  LTACH;SNF;Supervision/Assistance - 24 hour(pending medical progress)    Equipment Recommendations  3 in 1 bedside commode    Recommendations for Other Services      Precautions / Restrictions Precautions Precautions: Fall Precaution Comments: pt on trach collar  Restrictions Weight Bearing Restrictions: No       Mobility Bed Mobility Overal bed mobility: Needs Assistance Bed Mobility: Sidelying to Sit;Rolling Rolling: Supervision Sidelying to sit: Min assist       General bed mobility comments: pt able to complete supine to sidelying with cueing only, transition to sitting with min assist for trunk support  Transfers Overall transfer level: Needs assistance Equipment used: Rolling walker (2 wheeled) Transfers: Sit to/from [facility] Sit to Stand: Max assist;+2 physical assistance;+2  safety/equipment Stand pivot transfers: Max assist;+2 physical assistance;+2 safety/equipment       General transfer comment: sit to stand from EOB with max cueing for forward scoot, hand placement and technique; pt with poor carryover of hand placement (until attempted from recliner with arm rests after visual demonstration of technique); poor intation of steps during pivot requiring multimodal cueing with posterior lean    Balance Overall balance assessment: Needs assistance Sitting-balance support: Feet unsupported Sitting balance-Leahy Scale: Fair Sitting balance - Comments: close supervision statically    Standing balance support: Bilateral upper extremity supported;During functional activity Standing balance-Leahy Scale: Poor Standing balance comment: reliant on BUE and external support                           ADL either performed or assessed with clinical judgement   ADL Overall ADL's : Needs assistance/impaired     Grooming: Set up;Sitting;Wash/dry hands;Wash/dry face               Lower Body Dressing: Maximal assistance;+2 for physical assistance;+2 for safety/equipment;Sit to/from stand Lower Body Dressing Details (indicate cue type and reason): assist to don socks, requires +2 max sit<>stand with reliance on B UE support  Toilet Transfer: Maximal assistance;+2 for physical assistance;+2 for safety/equipment;Stand-pivot;RW Toilet Transfer Details (indicate cue type and reason): simulated to recliner         Functional mobility during ADLs: Maximal assistance;+2 for physical assistance;+2 for safety/equipment;Rolling walker;Cueing for safety;Cueing for sequencing       Vision   Vision Assessment?: No apparent visual deficits   Perception     Praxis      Cognition Arousal/Alertness: Awake/alert Behavior During Therapy: Anxious  Overall Cognitive Status: Difficult to assess                                 General Comments: used  PMSV to commuincatie, able to mouth words without--fatigues easily         Exercises     Shoulder Instructions       General Comments SpO2 100% on 60% FiO2, on 55% FiO2 when switched to tank and saturations maintained    Pertinent Vitals/ Pain       Pain Assessment: Faces Faces Pain Scale: No hurt  Home Living                                          Prior Functioning/Environment              Frequency  Min 2X/week        Progress Toward Goals  OT Goals(current goals can now be found in the care plan section)  Progress towards OT goals: Progressing toward goals  Acute Rehab OT Goals Patient Stated Goal: to go home OT Goal Formulation: With patient Time For Goal Achievement: 11/28/18 Potential to Achieve Goals: [place] Discharge plan needs to be updated;Frequency remains appropriate    Co-evaluation    PT/OT/SLP Co-Evaluation/Treatment: Yes Reason for Co-Treatment: Complexity of the patient's impairments (multi-system involvement)   OT goals addressed during session: ADL's and self-care      AM-PAC OT "6 Clicks" Daily Activity     Outcome Measure   Help from another person eating meals?: A Little Help from another person taking care of personal grooming?: A Little Help from another person toileting, which includes using toliet, bedpan, or urinal?: Total Help from another person bathing (including washing, rinsing, drying)?: A Lot Help from another person to put on and taking off regular upper body clothing?: A Little Help from another person to put on and taking off regular lower body clothing?: A Lot 6 Click Score: 14    End of Session Equipment Utilized During Treatment: Oxygen(via trach collar )  OT Visit Diagnosis: Muscle weakness (generalized) (M62.81);Unsteadiness on feet (R26.81);Other symptoms and signs involving cognitive function   Activity Tolerance Patient tolerated treatment well   Patient Left in chair;with call  bell/phone within reach;with chair alarm set   Nurse Communication Mobility status        Time: 5784-6962 OT Time Calculation (min): 32 min  Charges: OT General Charges $OT Visit: 1 Visit OT Treatments $Self Care/Home Management : 8-22 mins  [name], [place] Pager [phone] Office [phone]    [name] 11/14/2018, 10:59 AM

## 2018-11-14 NOTE — Progress Notes (Signed)
NAME:  Kathleen [name], MRN:  [id number], DOB:  June 24, 1950, LOS: 67 ADMISSION DATE:  09/22/2018, CONSULTATION DATE: 09/24/2018 REFERRING MD: Candee Furbish MD, CHIEF COMPLAINT: Cardiac arrest  Brief History   68 y/o obese woman with chronic diastolic heart failure who failed outpatient diuretics management due to chronic kidney disease.  Right heart cath on 5/29 showed markedly elevated biventricular filling pressures with normal cardiac output.  She was on cardiology service, maintained on milrinone and Lasix.  On the morning of 5/30 she went into PEA arrest, CPR performed for 8 minutes.  Intubated and transferred to ICU.  Started on CRRT 5/31.  Extubated 6/3.  Re-intubated early am 6/11 for hypercarbic respiratory failure and underwent tracheostomy on 6/19.  Past Medical History   has a past medical history of Atrial flutter ([place]), Bradycardia, Chronic diastolic (congestive) heart failure ([place]) (01/10/2015), CKD (chronic kidney disease), stage IV ([place]), Degenerative joint disease of hand, Diabetes mellitus, Dyslipidemia, Fecal occult blood test positive, GERD (gastroesophageal reflux disease), Headache, Hypertension, Inadequate material resources, Irritable bowel syndrome, Morbid obesity ([place]), Obesity hypoventilation syndrome ([place]), Post-menopausal bleeding, and Shortness of breath dyspnea.  Significant Hospital Events   5/28 Admit 5/29 RHC 5/30 PEA arrest, intubated/ CRRT 5/31 Milrinone stopped due to ectopy; brady episode- amio stopped 6/02 Attempt at SBT, chest x-ray with worsening pulmonary edema, CVVHD for volume removal 6/03 Extubated > remain on CRRT.  Refused nocturnal BiPAP 6/04 Refused nocturnal BiPAP 6/10 off CRRT 6/11 Intubated early am with hypercarbia, concern for RUL PNA; abx broadened; restarted on CRRT 6/16  No events overnight, weaning on high PS 6/17  failed vent wean, remains on levophed, midodrine and CRRT 6/18  Weaned for 5 hours on PSV 8/5 before requiring full support;  ongoing CRRT, remains on levophed 14 mcg 6/19 - TRACH + Dr Erskine Emery 6/20 -. On IV heparin gt, levophed gtt with midodrine. Off fent gtt. Pressopr needs down 6/26 - TCT tolerated for the past 24 hours  6/27 - Stable on TCT now >48hrs, tolerated first session of iHD  6/30 hypotension / bradycardia overnight, started on dopamine 7/2 Placed on ATC at 40% fiO2 7/6 Palliative care consult/ iHD 7/7 remains dependent on dopamine at 7, for bradycardia/ hypotension.   Palliative consult noted - family would like to visit 7/9 for goals of care meeting. 7/08 3.5 L off w/ iHD, weaned well all day, dopamine off late evening 7/9 great day, family meeting w/son and PMT, remains full code per patient 7/11 to ATC 7/13 remains on ATC 7/16 Trach change to 4 cuffless 7/17 Remains on 40% ATC 7/18 did tolerate swallowing yesterday  Consults:  Cardiology PCCM  Nephrology  EP  Procedures:  Lt PICC 5/29 >> 6/30 OETT 5/30 >> 6/3; 6/11 > 6/19 - TRACH (Dr Erskine Emery) >> R IJ HD cath 5/30 >> Aline left ulnar 5/30 >> 6/2 R nare cortrak 6/5 >> 7/13  Significant Diagnostic Tests:  TTE 5/28 >>The left ventricle has normal systolic function with an ejection fraction of 60-65%. . The right ventricle has normal systolic function. The cavity was mildly enlarged.   Right heart cath 5/29 RA = 24 RV = 94/26 PA = 95/36 (53) PCW = 30 (v = 50) Fick cardiac output/index = 6.0/2.7 PVR =3.9 WU FA sat = 91% PA sat = 58%, 58% SVC 60%  Micro Data:  SARS coronavirus 2 cepheid 5/28 >> neg MRSA PCR 5/28 >> neg Trach aspirate 6/2 >> MSSA BCx2 6/2 >> negative ...................... Tracheal aspirate 6/11 >>  few GNR >> few proteus and few candida tropicalis BCx2 6/11 >> negative 10/27/2018 tracheal aspirate>> gram-positive cocci gram-negative rods>>  Antimicrobials:  Vancomycin 6/3 > 6/4 Cefazolin 6/4 >> 6/10 ...........................Marland Kitchen Vanco 6/11 >> 6/17 Cefepime 6/11 >>6/18 Cefepime 10/27/2018>> 7/6  Interim  history/subjective:  No events overnight, trach remains uncapped  Objective   Blood pressure (!) 114/38, pulse (!) 50, temperature 97.9 F (36.6 C), temperature source Oral, resp. rate 19, height 5' (1.524 m), weight 105.5 kg, SpO2 100 %.    FiO2 (%):  [40 %-60 %] 60 %  No intake or output data in the 24 hours ending 11/14/18 1033 Filed Weights   11/11/18 1724 11/13/18 0500 11/14/18 0046  Weight: 97 kg 104.2 kg 105.5 kg   General: Well appearing, NAD Neuro: Alert and oriented, moving all ext to command HEENT: [place]/AT, PERRL, EOM-I and MMM, trach in place Lungs: CTA bilaterally Heart: RRR, Nl S1/S2 and -M/R/G Abdomen: Soft, NT, ND and +BS Skin: Intact  I reviewed CXR myself, trach is in a good position  Assessment & Plan:   Respiratory failure status post tracheostomy On trach 4.0 cuffless, tolerating it well - Keep saturations greater than 88% - Trach care per protocol - Clearing secretions well - Cap trach today, if tolerated overnight will consider decannulation in AM - Change O2 to [place] from TC after capping  Dialysis dependent renal failure - Continue per nephrology  Proteus colonization versus bronchitis - Completed course of cefepime - Trend fever and WBC - Reculture as needed  Persistent shock after PEA arrest Bradycardia Atrial fibrillation/history of atrial flutter Acute on chronic diastolic heart failure - Not a candidate for EP procedures - Follow-up with primary  Nutrition - Appears to have tolerated swallowing well, no need for PEG  Goals of care. -Full code  Dysphagia:  - Able to maintain  ATC at at FiO2 of 40% over night with 4.0 cuff less trach - Swallow pending  Discussed with PCCM-NP  Best practice:  Diet: Continue tube feeds Pain/Anxiety/Delirium protocol (if indicated): NA VAP protocol (if indicated): In place  DVT prophylaxis: On heparin GI prophylaxis: Pepcid Glucose control: SSI Mobility: PT/ OT as tolerated Code Status: Full  Disposition: ICU   Rush Farmer, M.D. [facility] Pulmonary/Critical Care Medicine. Pager: [phone]. After hours pager: [phone]

## 2018-11-14 NOTE — Progress Notes (Signed)
[facility]    NEPHROLOGY PROGRESS NOTE  SUBJECTIVE: Awake and alert.  In good spirits.  Patient seen and examined.  Denies chest pain, shortness of breath, nausea, vomiting, diarrhea or dysuria.  All other review of systems are negative.  Underwent additional dialysis on Saturday for fluid overload.  Continues her usual Monday, Wednesday, Friday dialysis schedule.  OBJECTIVE:  Vitals:   11/14/18 1147 11/14/18 1155  BP: (!) 98/38   Pulse: (!) 59 (!) 52  Resp:  16  Temp: 98.3 F (36.8 C)   SpO2: 100% 98%   No intake or output data in the 24 hours ending 11/14/18 1223    General:  AAOx3 NAD HEENT: MMM [place] AT anicteric sclera Neck:  No JVD, no adenopathy, positive tracheostomy CV:  Heart RRR  Lungs:  L/S decreased at the bases. Abd:  abd SNT/ND with normal BS, obese GU:  Bladder non-palpable Extremities: Trace bilateral lower extremity edema. Skin:  No skin rash  MEDICATIONS:  . aspirin  81 mg Oral Daily  . atorvastatin  40 mg Per Tube q1800  . chlorhexidine gluconate (MEDLINE KIT)  15 mL Mouth Rinse BID  . Chlorhexidine Gluconate Cloth  6 each Topical Daily  . Chlorhexidine Gluconate Cloth  6 each Topical Daily  . darbepoetin (ARANESP) injection - NON-DIALYSIS  200 mcg Subcutaneous Q Wed-1800  . famotidine  20 mg Per Tube Daily  . feeding supplement (PRO-STAT SUGAR FREE 64)  30 mL Oral BID  . Gerhardt's butt cream   Topical BID  . guaiFENesin  15 mL Per Tube Q6H  . insulin aspart  0-20 Units Subcutaneous Q4H  . insulin detemir  10 Units Subcutaneous QHS  . midodrine  10 mg Oral TID WC  . multivitamin  1 tablet Oral QHS  . polyethylene glycol  17 g Oral Daily  . sodium chloride flush  10-40 mL Intracatheter Q12H  . vitamin C  500 mg Oral BID  . zinc sulfate  220 mg Oral Daily       LABS:   CBC Latest Ref Rng & Units 11/12/2018 11/11/2018 11/10/2018  WBC 4.0 - 10.5 K/uL 5.5 4.6 5.1  Hemoglobin 12.0 - 15.0 g/dL 8.4(L) 8.1(L) 8.6(L)  Hematocrit 36.0 -  46.0 % 28.8(L) 28.2(L) 29.2(L)  Platelets 150 - 400 K/uL 137(L) 145(L) 158    CMP Latest Ref Rng & Units 11/14/2018 11/13/2018 11/12/2018  Glucose 70 - 99 mg/dL 128(H) 195(H) 166(H)  BUN 8 - 23 mg/dL 42(H) 27(H) 37(H)  Creatinine 0.44 - 1.00 mg/dL 3.95(H) 2.87(H) 3.56(H)  Sodium 135 - 145 mmol/L 129(L) 133(L) 132(L)  Potassium 3.5 - 5.1 mmol/L 4.9 4.3 4.2  Chloride 98 - 111 mmol/L 93(L) 97(L) 97(L)  CO2 22 - 32 mmol/L _0 Calcium 8.9 - 10.3 mg/dL 9.1 8.6(L) 8.5(L)  Total Protein 6.5 - 8.1 g/dL - - -  Total Bilirubin 0.3 - 1.2 mg/dL - - -  Alkaline Phos 38 - 126 U/L - - -  AST 15 - 41 U/L - - -  ALT 0 - 44 U/L - - -    Lab Results  Component Value Date   PTH 33 10/12/2018   CALCIUM 9.1 11/14/2018   CAION 1.23 10/24/2018   PHOS 3.0 11/14/2018       Component Value Date/Time   COLORURINE AMBER (A) 05/22/2013 1826   APPEARANCEUR HAZY (A) 05/22/2013 1826   LABSPEC 1.024 05/22/2013 1826   PHURINE 6.0 05/22/2013 1826   GLUCOSEU NEGATIVE 05/22/2013 1826  HGBUR NEGATIVE 05/22/2013 1826   BILIRUBINUR SMALL (A) 05/22/2013 1826   KETONESUR NEGATIVE 05/22/2013 1826   PROTEINUR 100 (A) 05/22/2013 1826   UROBILINOGEN 4.0 (H) 05/22/2013 1826   NITRITE NEGATIVE 05/22/2013 1826   LEUKOCYTESUR NEGATIVE 05/22/2013 1826      Component Value Date/Time   PHART 7.320 (L) 10/24/2018 0952   PCO2ART 48.7 (H) 10/24/2018 0952   PO2ART 69.0 (L) 10/24/2018 0952   HCO3 25.2 10/24/2018 0952   TCO2 27 10/24/2018 0952   ACIDBASEDEF 1.0 10/24/2018 0952   O2SAT 92.0 10/24/2018 0952       Component Value Date/Time   IRON 44 10/12/2018 1640   TIBC 379 10/12/2018 1640   IRONPCTSAT 12 10/12/2018 1640       ASSESSMENT/PLAN:    68 year old female patient with a past medical history significant for chronic kidney disease stage IV with a baseline creatinine of 1.7-1.9 who presented with ATN secondary to shock, chronic respiratory failure, Proteus in the sputum, and acute on chronic diastolic heart  failure.  1.  Acute kidney injury Superimposed on CKD IV (baseline 1.7-1.9) likely ATN secondary to shock, anuric: Transitioned to iHD on 6/27. Using right [facility] and on MWF schedule.  She is on midodrine for hypotension at 20 TID and florinef 0.2 mg daily added 7/4.  We will hold Florinef for now given fluid overload.  No signs of recovery at present.  Continue supportive care with dialysis at this present time. HD MWF.  Vascular to place fistula probably Tuesday.  Phos stable at 3.0 on 7/20.  2.  Proteus +respiratory culture: s/p cefepime, ended 7/8 per PCCM.    3.  Chronic respiratory failure: Status post tracheostomy. Currently on vent, per pulmonary, weaning as tolerated. Now TC.  4.  Acute on chronic diastolic heart failure: Appreciate the expertise of the heart failure team.  Plans to try to correlate bradycardia with hypotension to see if could/ would be a candidate for TVP/ RV micro device--> not a candidate for these interventions per notes; not felt to be a candidate for PPM per EP team.   5.  Anemia due to chronic disease and acute blood loss: Continue ESA, Increase Aranesp to 200 mcg for 7/15, status post.  Hb stable in mid 8s.   6.  Bradycardia, PEA cardiac arrest/ Afib :  As above in #4. On hep gtt as well.  7.  Dispo:  Palliative care discussion noted and greatly appreciated.    Plan is for LTAC.      [place], DO, [place]

## 2018-11-14 NOTE — Progress Notes (Signed)
Reminded respiratory therapist about the capping of trach as ordered.

## 2018-11-14 NOTE — Progress Notes (Signed)
RT assessed patient and tried her on a capping trial. Patients trach was capped and patient was placed on 4 liter N/C. Patients respiratory rate immediately went up to 32 and her WOB continued to increase.  She showed signs of struggle and that she was not ready to tolerate being capped at this time.  RT will report to day shift RT and if evaluate patient for trial again in the AM.

## 2018-11-14 NOTE — Anesthesia Preprocedure Evaluation (Addendum)
Anesthesia Evaluation  Patient identified by MRN, date of birth, ID band Patient awake    Reviewed: Allergy & Precautions, H&P , NPO status , Patient's Chart, lab work & pertinent test results  Airway Mallampati: Trach  TM Distance: >3 FB Neck ROM: Full    Dental no notable dental hx. (+) Teeth Intact   Pulmonary shortness of breath, sleep apnea ,    Pulmonary exam normal breath sounds clear to auscultation       Cardiovascular hypertension, + CAD  Normal cardiovascular exam+ dysrhythmias  Rhythm:Regular Rate:Normal  09/22/18 Echo LVEF 60-65%   Neuro/Psych negative neurological ROS  negative psych ROS   GI/Hepatic Neg liver ROS,   Endo/Other  diabetes, Type 2  Renal/GU Renal InsufficiencyRenal diseaseK+ 4.9 Cr 3.95     Musculoskeletal   Abdominal   Peds  Hematology Hgb 8.5 plt 108   Anesthesia Other Findings   Reproductive/Obstetrics                            Lab Results  Component Value Date   CREATININE 2.50 (H) 11/15/2018   BUN 21 11/15/2018   NA 133 (L) 11/15/2018   K 4.3 11/15/2018   CL 98 11/15/2018   CO2 27 11/15/2018    Lab Results  Component Value Date   WBC 7.7 11/14/2018   HGB 8.5 (L) 11/14/2018   HCT 29.4 (L) 11/14/2018   MCV 102.1 (H) 11/14/2018   PLT 108 (L) 11/14/2018    Anesthesia Physical Anesthesia Plan  ASA: IV  Anesthesia Plan: General   Post-op Pain Management:    Induction: Intravenous  PONV Risk Score and Plan: 4 or greater and Treatment may vary due to age or medical condition, Ondansetron and Dexamethasone  Airway Management Planned: Tracheostomy  Additional Equipment:   Intra-op Plan:   Post-operative Plan: Extubation in OR  Informed Consent: I have reviewed the patients History and Physical, chart, labs and discussed the procedure including the risks, benefits and alternatives for the proposed anesthesia with the patient or authorized  representative who has indicated his/her understanding and acceptance.     Dental advisory given  Plan Discussed with: CRNA  Anesthesia Plan Comments:        Anesthesia Quick Evaluation

## 2018-11-14 NOTE — Progress Notes (Signed)
Nutrition Follow-up  RD working remotely.  DOCUMENTATION CODES:   Obesity unspecified  INTERVENTION:   - Encourage adequate PO intake and provide feeding assistance as needed  - Continue Magic cup TID with meals, each supplement provides 290 kcal and 9 grams of protein  - Continue renal MVI daily  - Add Pro-stat 30 ml BID, each supplement provides 100 kcal and 15 grams of protein  - Ordered vitamin C 500 mg po BID and zinc sulfate 220 mg po daily to aid in wound healing (verbal with readback order placed per Dr. Cathlean Sauer)  NUTRITION DIAGNOSIS:   Increased nutrient needs related to wound healing as evidenced by estimated needs.  Ongoing  GOAL:   Patient will meet greater than or equal to 90% of their needs  Progressing  MONITOR:   PO intake, Supplement acceptance, Diet advancement, Labs, Weight trends, Skin  REASON FOR ASSESSMENT:   Consult Enteral/tube feeding initiation and management  ASSESSMENT:   68 year old with morbid obesity, acute on chronic diastolic heart failure, pulmonary hypertension transferred to the ICU after PEA arrest.  Suspect respiratory failure due to pulmonary edema.  5/30 - s/p PEA arrest, intubated and transferred to ICU,CRRTinitiated 6/02 - failed SBT, pulm edema 6/03 - extubated, refused BiPAP 6/10 - CRRT stopped  6/11 - re-intubated, CRRT re-started 6/19 - failed SBT, s/p trach  6/27 - transition iHD 7/13 - Cortrak pulled out 7/15 - NGT placed 7/16 - trach changed to 4 cuffless 7/17 - Cortrak replaced, diet advanced to Dysphagia 2 with tin liquids after FEES 7/19 - Cortrak removed  Surgery has signed off. No plan for surgical G-tube. Noted plan for left arm AVF tomorrow, 7/21. Pt will be NPO at midnight for this procedure.  Spoke with RN via phone call. RN reports pt is currently eating breakfast and states she is doing well with it.  Admission weight: 134.8 kg. Current weight: 105.5 kg. Will continue to monitor trends.  Per  RN edema assessment, pt with mild pitting generalized edema, non-pitting edema to RUE, and mild pitting edema to LUE and BLE.  Meal Completion: - 30% x 1 meal on 7/18 - 65% x 1 meal on 7/19  Medications reviewed and include: Aranesp, Pepcid, SSI, Levemir 10 units daily, rena-vit, Miralax  Labs reviewed: sodium 129, chloride 93 CBG's: 99-220 x 24 hours  NUTRITION - FOCUSED PHYSICAL EXAM:  Unable to complete at this time. RD working remotely.  Diet Order:   Diet Order            Diet NPO time specified  Diet effective midnight        DIET DYS 2 Room service appropriate? Yes; Fluid consistency: Thin  Diet effective now              EDUCATION NEEDS:   Education needs have been addressed  Skin:  Skin Assessment: Skin Integrity Issues: Stage II: right coccyx, vertebral column Incisions: right neck/chest Other: MASD to bilateral breast  Last BM:  11/13/18 medium type 5  Height:   Ht Readings from Last 1 Encounters:  10/05/18 5' (1.524 m)    Weight:   Wt Readings from Last 1 Encounters:  11/14/18 105.5 kg    Ideal Body Weight:  45.5 kg  BMI:  Body mass index is 45.42 kg/m.  Estimated Nutritional Needs:   Kcal:  1800-2000 kcal  Protein:  100-120 grams  Fluid:  >/= 1.7 L/day    Gaynell Face, MS, RD, LDN Inpatient Clinical Dietitian Pager: [phone] Weekend/After Hours:  [phone]  

## 2018-11-15 ENCOUNTER — Inpatient Hospital Stay (HOSPITAL_COMMUNITY): Payer: Medicare HMO | Admitting: Anesthesiology

## 2018-11-15 ENCOUNTER — Encounter (HOSPITAL_COMMUNITY)
Admission: AD | Disposition: A | Discharge status: Nursing Home (Includes ICF) | Payer: Medicare HMO | Source: Ambulatory Visit | Attending: Interventional Cardiology

## 2018-11-15 DIAGNOSIS — N184 Chronic kidney disease, stage 4 (severe): Secondary | ICD-10-CM

## 2018-11-15 HISTORY — PX: AV FISTULA PLACEMENT: SHX1204

## 2018-11-15 LAB — BASIC METABOLIC PANEL
Anion gap: 8 (ref 5–15)
BUN: 21 mg/dL (ref 8–23)
CO2: 27 mmol/L (ref 22–32)
Calcium: 8.7 mg/dL — ABNORMAL LOW (ref 8.9–10.3)
Chloride: 98 mmol/L (ref 98–111)
Creatinine, Ser: 2.5 mg/dL — ABNORMAL HIGH (ref 0.44–1.00)
GFR calc Af Amer: 22 mL/min — ABNORMAL LOW (ref >60)
GFR calc non Af Amer: 19 mL/min — ABNORMAL LOW (ref >60)
Glucose, Bld: 139 mg/dL — ABNORMAL HIGH (ref 70–99)
Potassium: 4.3 mmol/L (ref 3.5–5.1)
Sodium: 133 mmol/L — ABNORMAL LOW (ref 135–145)

## 2018-11-15 LAB — GLUCOSE, CAPILLARY
Glucose-Capillary: 135 mg/dL — ABNORMAL HIGH (ref 70–99)
Glucose-Capillary: 145 mg/dL — ABNORMAL HIGH (ref 70–99)
Glucose-Capillary: 184 mg/dL — ABNORMAL HIGH (ref 70–99)
Glucose-Capillary: 199 mg/dL — ABNORMAL HIGH (ref 70–99)
Glucose-Capillary: 259 mg/dL — ABNORMAL HIGH (ref 70–99)
Glucose-Capillary: 315 mg/dL — ABNORMAL HIGH (ref 70–99)

## 2018-11-15 SURGERY — ARTERIOVENOUS (AV) FISTULA CREATION
Anesthesia: General | Laterality: Left

## 2018-11-15 MED ORDER — PROPOFOL 10 MG/ML IV BOLUS
INTRAVENOUS | Status: AC
  Filled 2018-11-15: qty 20

## 2018-11-15 MED ORDER — SODIUM CHLORIDE 0.9 % IV SOLN
INTRAVENOUS | Status: AC
  Filled 2018-11-15: qty 1.2

## 2018-11-15 MED ORDER — CHLORHEXIDINE GLUCONATE CLOTH 2 % EX PADS
6.0000 | MEDICATED_PAD | Freq: Every day | CUTANEOUS | Status: DC
  Administered 2018-11-15 – 2018-11-16 (×2): 6 via TOPICAL

## 2018-11-15 MED ORDER — HYDROMORPHONE HCL 1 MG/ML IJ SOLN: 0.2500 mg | INTRAMUSCULAR | Status: DC | PRN

## 2018-11-15 MED ORDER — SODIUM CHLORIDE 0.9 % IV SOLN
INTRAVENOUS | Status: DC | PRN
  Administered 2018-11-15: 07:00:00 via INTRAVENOUS

## 2018-11-15 MED ORDER — MEPERIDINE HCL 25 MG/ML IJ SOLN: 6.2500 mg | INTRAMUSCULAR | Status: DC | PRN

## 2018-11-15 MED ORDER — ONDANSETRON HCL 4 MG/2ML IJ SOLN
INTRAMUSCULAR | Status: AC
  Filled 2018-11-15: qty 2

## 2018-11-15 MED ORDER — EPHEDRINE 5 MG/ML INJ
INTRAVENOUS | Status: AC
  Filled 2018-11-15: qty 10

## 2018-11-15 MED ORDER — ONDANSETRON HCL 4 MG/2ML IJ SOLN
INTRAMUSCULAR | Status: DC | PRN
  Administered 2018-11-15: 4 mg via INTRAVENOUS

## 2018-11-15 MED ORDER — ACETAMINOPHEN 10 MG/ML IV SOLN: 1000.0000 mg | Freq: Once | INTRAVENOUS | Status: DC | PRN

## 2018-11-15 MED ORDER — PHENYLEPHRINE 40 MCG/ML (10ML) SYRINGE FOR IV PUSH (FOR BLOOD PRESSURE SUPPORT)
PREFILLED_SYRINGE | INTRAVENOUS | Status: DC | PRN
  Administered 2018-11-15 (×2): 200 ug via INTRAVENOUS

## 2018-11-15 MED ORDER — OXYCODONE HCL 5 MG PO TABS: 5.0000 mg | ORAL_TABLET | Freq: Once | ORAL | Status: DC | PRN

## 2018-11-15 MED ORDER — OXYCODONE HCL 5 MG/5ML PO SOLN: 5.0000 mg | Freq: Once | ORAL | Status: DC | PRN

## 2018-11-15 MED ORDER — FENTANYL CITRATE (PF) 250 MCG/5ML IJ SOLN
INTRAMUSCULAR | Status: AC
  Filled 2018-11-15: qty 5

## 2018-11-15 MED ORDER — DEXAMETHASONE SODIUM PHOSPHATE 10 MG/ML IJ SOLN
INTRAMUSCULAR | Status: DC | PRN
  Administered 2018-11-15: 4 mg via INTRAVENOUS

## 2018-11-15 MED ORDER — ROCURONIUM BROMIDE 10 MG/ML (PF) SYRINGE
PREFILLED_SYRINGE | INTRAVENOUS | Status: AC
  Filled 2018-11-15: qty 10

## 2018-11-15 MED ORDER — EPHEDRINE SULFATE-NACL 50-0.9 MG/10ML-% IV SOSY
PREFILLED_SYRINGE | INTRAVENOUS | Status: DC | PRN
  Administered 2018-11-15: 25 mg via INTRAVENOUS

## 2018-11-15 MED ORDER — GUAIFENESIN 100 MG/5ML PO SOLN
15.0000 mL | Freq: Four times a day (QID) | ORAL | Status: DC
  Administered 2018-11-15 – 2018-11-17 (×8): 300 mg via ORAL
  Filled 2018-11-15: qty 10
  Filled 2018-11-15: qty 5
  Filled 2018-11-15: qty 15
  Filled 2018-11-15: qty 5
  Filled 2018-11-15 (×4): qty 15
  Filled 2018-11-15: qty 5

## 2018-11-15 MED ORDER — ALPRAZOLAM 0.5 MG PO TABS: 1.0000 mg | ORAL_TABLET | Freq: Three times a day (TID) | ORAL | Status: DC | PRN

## 2018-11-15 MED ORDER — PROPOFOL 10 MG/ML IV BOLUS
INTRAVENOUS | Status: DC | PRN
  Administered 2018-11-15: 150 mg via INTRAVENOUS

## 2018-11-15 MED ORDER — PHENYLEPHRINE 40 MCG/ML (10ML) SYRINGE FOR IV PUSH (FOR BLOOD PRESSURE SUPPORT)
PREFILLED_SYRINGE | INTRAVENOUS | Status: AC
  Filled 2018-11-15: qty 10

## 2018-11-15 MED ORDER — SUCCINYLCHOLINE CHLORIDE 200 MG/10ML IV SOSY
PREFILLED_SYRINGE | INTRAVENOUS | Status: AC
  Filled 2018-11-15: qty 10

## 2018-11-15 MED ORDER — SODIUM CHLORIDE 0.9 % IV SOLN
INTRAVENOUS | Status: DC | PRN
  Administered 2018-11-15: 08:00:00 100 ug/min via INTRAVENOUS

## 2018-11-15 MED ORDER — CEFAZOLIN SODIUM-DEXTROSE 2-3 GM-%(50ML) IV SOLR
INTRAVENOUS | Status: DC | PRN
  Administered 2018-11-15: 2 g via INTRAVENOUS

## 2018-11-15 MED ORDER — LIDOCAINE 2% (20 MG/ML) 5 ML SYRINGE
INTRAMUSCULAR | Status: AC
  Filled 2018-11-15: qty 5

## 2018-11-15 MED ORDER — FENTANYL CITRATE (PF) 100 MCG/2ML IJ SOLN
INTRAMUSCULAR | Status: DC | PRN
  Administered 2018-11-15: 50 ug via INTRAVENOUS

## 2018-11-15 MED ORDER — DOCUSATE SODIUM 50 MG/5ML PO LIQD: 100.0000 mg | Freq: Two times a day (BID) | ORAL | Status: DC | PRN

## 2018-11-15 MED ORDER — ONDANSETRON HCL 4 MG/2ML IJ SOLN: 4.0000 mg | Freq: Once | INTRAMUSCULAR | Status: DC | PRN

## 2018-11-15 MED ORDER — MUPIROCIN 2 % EX OINT
1.0000 "application " | TOPICAL_OINTMENT | Freq: Two times a day (BID) | CUTANEOUS | Status: DC
  Administered 2018-11-15 – 2018-11-17 (×5): 1 via NASAL
  Filled 2018-11-15: qty 22

## 2018-11-15 MED ORDER — MIDAZOLAM HCL 2 MG/2ML IJ SOLN
INTRAMUSCULAR | Status: AC
  Filled 2018-11-15: qty 2

## 2018-11-15 MED ORDER — CHLORHEXIDINE GLUCONATE CLOTH 2 % EX PADS: 6.0000 | MEDICATED_PAD | Freq: Every day | CUTANEOUS | Status: DC

## 2018-11-15 MED ORDER — ACETAMINOPHEN 160 MG/5ML PO SOLN: 650.0000 mg | Freq: Four times a day (QID) | ORAL | Status: DC | PRN

## 2018-11-15 MED ORDER — FAMOTIDINE 40 MG/5ML PO SUSR
20.0000 mg | Freq: Every day | ORAL | Status: DC
  Administered 2018-11-16 – 2018-11-17 (×2): 20 mg via ORAL
  Filled 2018-11-15 (×2): qty 2.5

## 2018-11-15 MED ORDER — ATORVASTATIN CALCIUM 40 MG PO TABS
40.0000 mg | ORAL_TABLET | Freq: Every day | ORAL | Status: DC
  Administered 2018-11-15 – 2018-11-16 (×2): 40 mg via ORAL
  Filled 2018-11-15 (×2): qty 1

## 2018-11-15 MED ORDER — DEXAMETHASONE SODIUM PHOSPHATE 10 MG/ML IJ SOLN
INTRAMUSCULAR | Status: AC
  Filled 2018-11-15: qty 1

## 2018-11-15 MED ORDER — SODIUM CHLORIDE 0.9 % IV SOLN
INTRAVENOUS | Status: DC | PRN
  Administered 2018-11-15: 500 mL

## 2018-11-15 SURGICAL SUPPLY — 28 items
ARMBAND PINK RESTRICT EXTREMIT (MISCELLANEOUS) ×3 IMPLANT
CANISTER SUCT 3000ML PPV (MISCELLANEOUS) ×3 IMPLANT
CLIP VESOCCLUDE MED 6/CT (CLIP) ×3 IMPLANT
CLIP VESOCCLUDE SM WIDE 6/CT (CLIP) ×3 IMPLANT
COVER PROBE W GEL 5X96 (DRAPES) ×2 IMPLANT
COVER WAND RF STERILE (DRAPES) ×3 IMPLANT
DERMABOND ADVANCED (GAUZE/BANDAGES/DRESSINGS) ×2
DERMABOND ADVANCED .7 DNX12 (GAUZE/BANDAGES/DRESSINGS) ×1 IMPLANT
ELECT REM PT RETURN 9FT ADLT (ELECTROSURGICAL) ×3
ELECTRODE REM PT RTRN 9FT ADLT (ELECTROSURGICAL) ×1 IMPLANT
GLOVE BIO SURGEON STRL SZ7.5 (GLOVE) ×3 IMPLANT
GOWN STRL REUS W/ TWL LRG LVL3 (GOWN DISPOSABLE) ×2 IMPLANT
GOWN STRL REUS W/ TWL XL LVL3 (GOWN DISPOSABLE) ×1 IMPLANT
GOWN STRL REUS W/TWL LRG LVL3 (GOWN DISPOSABLE) ×4
GOWN STRL REUS W/TWL XL LVL3 (GOWN DISPOSABLE) ×2
INSERT FOGARTY SM (MISCELLANEOUS) IMPLANT
KIT BASIN OR (CUSTOM PROCEDURE TRAY) ×3 IMPLANT
KIT TURNOVER KIT B (KITS) ×3 IMPLANT
NS IRRIG 1000ML POUR BTL (IV SOLUTION) ×3 IMPLANT
PACK CV ACCESS (CUSTOM PROCEDURE TRAY) ×3 IMPLANT
PAD ARMBOARD 7.5X6 YLW CONV (MISCELLANEOUS) ×6 IMPLANT
SUT MNCRL AB 4-0 PS2 18 (SUTURE) ×3 IMPLANT
SUT PROLENE 6 0 BV (SUTURE) ×5 IMPLANT
SUT VIC AB 3-0 SH 27 (SUTURE) ×2
SUT VIC AB 3-0 SH 27X BRD (SUTURE) ×1 IMPLANT
TOWEL GREEN STERILE (TOWEL DISPOSABLE) ×3 IMPLANT
UNDERPAD 30X30 (UNDERPADS AND DIAPERS) ×3 IMPLANT
WATER STERILE IRR 1000ML POUR (IV SOLUTION) ×3 IMPLANT

## 2018-11-15 NOTE — Anesthesia Procedure Notes (Signed)
Procedure Name: Intubation Date/Time: 11/15/2018 7:38 AM Performed by: Leonor Liv, CRNA Pre-anesthesia Checklist: Patient identified, Emergency Drugs available, Suction available, Patient being monitored and Timeout performed Patient Re-evaluated:Patient Re-evaluated prior to induction Oxygen Delivery Method: Circle system utilized Preoxygenation: Pre-oxygenation with 100% oxygen Induction Type: Tracheostomy Ventilation: Unable to mask ventilate Tube size: 6.5 mm Number of attempts: 1 Airway Equipment and Method: Tracheostomy Placement Confirmation: positive ETCO2,  breath sounds checked- equal and bilateral and ETT inserted through vocal cords under direct vision Secured at: 11 cm Tube secured with: Tape Comments: 5.0 uncuffed shiley removed after IV induction and 6.5 OETT inserted to depth of 11 cm. VSS

## 2018-11-15 NOTE — Anesthesia Postprocedure Evaluation (Signed)
Anesthesia Post Note  Patient: Kathleen [name]  Procedure(s) Performed: ARTERIOVENOUS (AV) FISTULA CREATION LEFT ARM (Left )     Patient location during evaluation: PACU Anesthesia Type: General Level of consciousness: awake and alert Pain management: pain level controlled Vital Signs Assessment: post-procedure vital signs reviewed and stable Respiratory status: spontaneous breathing, nonlabored ventilation, respiratory function stable and patient connected to nasal cannula oxygen Cardiovascular status: blood pressure returned to baseline and stable Postop Assessment: no apparent nausea or vomiting Anesthetic complications: no    Last Vitals:  Vitals:   11/15/18 0955 11/15/18 1016  BP:  (!) 99/33  Pulse:  (!) 56  Resp:  16  Temp:  37.1 C  SpO2: 99% 93%    Last Pain:  Vitals:   11/15/18 1016  TempSrc: Oral  PainSc: 0-No pain                 Barnet Glasgow

## 2018-11-15 NOTE — Progress Notes (Signed)
Desats to 80's on 40% trach collar .  o2 increased to 60 % . Sat 98% cont. to monitor.

## 2018-11-15 NOTE — Progress Notes (Signed)
PROGRESS NOTE    Kathleen [name]  TMH:[id number] DOB: 07-04-1950 DOA: 09/22/2018 PCP: Aldine Contes, MD    Brief Narrative:  68 year old female presented to the outpatient cardiology clinic for follow-up. She does have significant past medical history for permanent atrial fibrillation, hypertension, morbid obesity, type 2 diabetes mellitus, dyslipidemia, obesity hypoventilation, chronic kidney disease stage V, anddiastolic heart failure. She reported orthopnea, increased dyspnea on exertion, and increased lower extremity edema. Patient failed for outpatient diuretic management, and she was directly admitted to the hospital for further evaluation.  She had a prolonged hospitalization. Patient was initially placed on milrinone and furosemide. May 30 she suffered from a cardiac arrest, pulseless electrical activity. She recovered spontaneous circulation after 8 minutes of CPR. She was intubated and supported with mechanical ventilation.  She had worsening kidney function required CRRT May 31. She was extubated June 3, and reintubated June 11 for hypercapnic respiratory failure. She remainedventilator dependent respiratory failureand on June 19 underwent tracheostomy.  She required vasopressors and inotropes for hemodynamic decompensation.  She was [facility] on July 12.  11/10/18 Trach changed to cuffless #4, off dopamine.   11/11/18 Transferred to progressive care unit.  Patient has passed her swallow evaluation, trach changed to #4 cuffless, diet has been advanced.  Now status permanent access for HD.   11/15/18 Waiting for placement at SNF.     Assessment & Plan:   Active Problems:   Type 2 diabetes mellitus with other specified complication (HCC)   Dyslipidemia   OBESITY   Acute on chronic diastolic CHF (congestive heart failure) (HCC)   Acute kidney injury superimposed on chronic kidney disease (HCC)   Acute respiratory failure with hypoxia  (HCC)   VAP (ventilator-associated pneumonia) (HCC)   Shock ([place])   Acute respiratory failure (HCC)   CHF (congestive heart failure) ([place])   Palliative care encounter   Dysphagia   Tracheostomy status ([place])   1.Acute respiratory failure, hypoxic andhypercapnic. off mechanical ventilation since June 11.Patient now on a simple mask, with 15 LPM of supplemental 02, oxygen saturation 93 to 96% . Continue oxymetry monitoring.   2. Diastolic heart failure with hypotension/ LV EF 60 to 65%). Systolic blood pressure has been 90 to 100, continue midodrine. Volume management per HD.  3. Stage V CKD due to ATN, related to shock.Transitioned to regular HD on June 27, getting HD M-W-F. Patient now has a left arm brachiocephalic AV fistula. Continue HD per nephrology recommendations, per right tunneled HD catheter. Patient continue anuric.   4. Swallow dysfunction.Tolerating po well, no nausea or vomiting. Today with Passy-Muir valve in place.   5. Chronic atrial fibrillation.patient has been bradycardic, will continue to hold anticoagulation due to vaginal bleeding, plan to start on apixaban in 24 to 48 H, the bleeding has stopped.  6. T2DM.Fasting glucose 139, with capillary glucose 199, 145, 135, 184. Continue with basal insulin 10 units of detemir,plus insulin sliding scale, tolerating oral feedings well.   7. Chronic anemia with iron deficiency. Check cell count only as needed.   8. Morbid obesitywith dysplidemia.Continue statin therapy, her calculated BMIis42.  9. Ventilator associated pneumonia (not present on admission). Proteus. Completed antibiotic therapy on 07/08.Cuffless trach #4,Continue oxymetry monitoring, aspiration precautions. Follow with pulmonary for future trach recommendations.   DVT prophylaxis:scd Code Status:full Family Communication:No family at the bedside Disposition Plan/ discharge barriers: SNF pending on patient. Will keep in  progressive care for the next 24 to 48 H to monitor respiratory status.   Body mass  index is 44.48 kg/m. Malnutrition Type:  Nutrition Problem: Increased nutrient needs Etiology: wound healing   Malnutrition Characteristics:  Signs/Symptoms: estimated needs   Nutrition Interventions:  Interventions: Magic cup, MVI, Prostat  RN Pressure Injury Documentation:      Consultants:  Nephrology   Pulmonary  Procedures:  Trach   Subjective: Patient is feeling well, had AV fistula created this am, no pain, no dyspnea, nausea or vomiting, has been tolerating po well, with no nausea or vomiting.   Objective: Vitals:   11/15/18 0945 11/15/18 0955 11/15/18 1016 11/15/18 1114  BP: (!) 96/48  (!) 99/33   Pulse: 64  (!) 56 (!) 58  Resp: (!) 25  16 20   Temp:   98.7 F (37.1 C)   TempSrc:   Oral   SpO2: 97% 99% 93% 96%  Weight:      Height:        Intake/Output Summary (Last 24 hours) at 11/15/2018 1209 Last data filed at 11/15/2018 4163 Gross per 24 hour  Intake 510 ml  Output 437 ml  Net 73 ml   Filed Weights   11/14/18 1210 11/14/18 1619 11/15/18 0535  Weight: 104.3 kg 103.4 kg 103.3 kg    Examination:   General: deconditioned  Neurology: awake and alert, following commands, but very weak and deconditioned  E ENT: trach in place.  Cardiovascular: No JVD. S1-S2 present, rhythmic, no gallops, rubs, or murmurs. No lower extremity edema. Pulmonary: positive breath sounds bilaterally, no wheezing, scattered rhonchi, but no rales. Gastrointestinal. Abdomen with no organomegaly, non tender, no rebound or guarding Skin. No rashe Musculoskeletal: no joint deformities     Data Reviewed: I have personally reviewed following labs and imaging studies  CBC: Recent Labs  Lab 11/09/18 1727 11/10/18 0502 11/11/18 0413 11/12/18 0447 11/14/18 1253  WBC 5.6 5.1 4.6 5.5 7.7  HGB 8.6* 8.6* 8.1* 8.4* 8.5*  HCT 29.2* 29.2* 28.2* 28.8* 29.4*  MCV 97.0 99.3 97.2  98.6 102.1*  PLT 158 158 145* 137* 845*   Basic Metabolic Panel: Recent Labs  Lab 11/10/18 0502 11/11/18 0413 11/12/18 0447 11/13/18 0511 11/14/18 0415 11/15/18 0431  NA 133* 132* 132* 133* 129* 133*  K 3.9 3.8 4.2 4.3 4.9 4.3  CL 95* 96* 97* 97* 93* 98  CO2 26 26 25 26 24 27   GLUCOSE 189* 195* 166* 195* 128* 139*  BUN 31* 45* 37* 27* 42* 21  CREATININE 3.34* 4.36* 3.56* 2.87* 3.95* 2.50*  CALCIUM 8.3* 8.6* 8.5* 8.6* 9.1 8.7*  MG 2.2  --  2.2  --   --   --   PHOS 3.3 4.4 2.8 2.5 3.0  --    GFR: Estimated Creatinine Clearance: 23.6 mL/min (A) (by C-G formula based on SCr of 2.5 mg/dL (H)). Liver Function Tests: Recent Labs  Lab 11/10/18 0502 11/11/18 0413 11/12/18 0447 11/13/18 0511 11/14/18 0415  ALBUMIN 2.4* 2.3* 2.7* 2.8* 2.7*   No results for input(s): LIPASE, AMYLASE in the last 168 hours. No results for input(s): AMMONIA in the last 168 hours. Coagulation Profile: No results for input(s): INR, PROTIME in the last 168 hours. Cardiac Enzymes: No results for input(s): CKTOTAL, CKMB, CKMBINDEX, TROPONINI in the last 168 hours. BNP (last 3 results) No results for input(s): PROBNP in the last 8760 hours. HbA1C: No results for input(s): HGBA1C in the last 72 hours. CBG: Recent Labs  Lab 11/14/18 2026 11/15/18 0000 11/15/18 0337 11/15/18 0857 11/15/18 1119  GLUCAP 253* 199* 145* 135* 184*   Lipid  Profile: No results for input(s): CHOL, HDL, LDLCALC, TRIG, CHOLHDL, LDLDIRECT in the last 72 hours. Thyroid Function Tests: No results for input(s): TSH, T4TOTAL, FREET4, T3FREE, THYROIDAB in the last 72 hours. Anemia Panel: No results for input(s): VITAMINB12, FOLATE, FERRITIN, TIBC, IRON, RETICCTPCT in the last 72 hours.    Radiology Studies: I have reviewed all of the imaging during this hospital visit personally     Scheduled Meds: . aspirin  81 mg Oral Daily  . atorvastatin  40 mg Per Tube q1800  . chlorhexidine gluconate (MEDLINE KIT)  15 mL Mouth  Rinse BID  . Chlorhexidine Gluconate Cloth  6 each Topical Daily  . Chlorhexidine Gluconate Cloth  6 each Topical Daily  . darbepoetin (ARANESP) injection - NON-DIALYSIS  200 mcg Subcutaneous Q Wed-1800  . famotidine  20 mg Per Tube Daily  . feeding supplement (PRO-STAT SUGAR FREE 64)  30 mL Oral BID  . Gerhardt's butt cream   Topical BID  . guaiFENesin  15 mL Per Tube Q6H  . insulin aspart  0-20 Units Subcutaneous Q4H  . insulin detemir  10 Units Subcutaneous QHS  . midodrine  10 mg Oral TID WC  . multivitamin  1 tablet Oral QHS  . polyethylene glycol  17 g Oral Daily  . sodium chloride flush  10-40 mL Intracatheter Q12H  . vitamin C  500 mg Oral BID  . zinc sulfate  220 mg Oral Daily   Continuous Infusions: . sodium chloride 250 mL (10/31/18 2358)  .  ceFAZolin (ANCEF) IV       LOS: 54 days        Meliza Kage Gerome Apley, MD

## 2018-11-15 NOTE — Progress Notes (Signed)
Admit: 09/22/2018 LOS: 34  74F with prolonged dialysis dependent AKI from ATN, prolonged/chronic RF s/p Trach, AoC dCHF, debility, s/p PEA arrest  Subjective:  . S/p L BC AVF this AM . HD yesterday: 0.4L UF . No new complaints . On TC this AM   07/20 0701 - 07/21 0700 In: 360 [P.O.:360] Out: 437   Filed Weights   11/14/18 1210 11/14/18 1619 11/15/18 0535  Weight: 104.3 kg 103.4 kg 103.3 kg    Scheduled Meds: . aspirin  81 mg Oral Daily  . atorvastatin  40 mg Oral q1800  . chlorhexidine gluconate (MEDLINE KIT)  15 mL Mouth Rinse BID  . Chlorhexidine Gluconate Cloth  6 each Topical Daily  . Chlorhexidine Gluconate Cloth  6 each Topical Daily  . darbepoetin (ARANESP) injection - NON-DIALYSIS  200 mcg Subcutaneous Q Wed-1800  . [START ON 11/16/2018] famotidine  20 mg Oral Daily  . feeding supplement (PRO-STAT SUGAR FREE 64)  30 mL Oral BID  . Gerhardt's butt cream   Topical BID  . guaiFENesin  15 mL Oral Q6H  . insulin aspart  0-20 Units Subcutaneous Q4H  . insulin detemir  10 Units Subcutaneous QHS  . midodrine  10 mg Oral TID WC  . multivitamin  1 tablet Oral QHS  . polyethylene glycol  17 g Oral Daily  . sodium chloride flush  10-40 mL Intracatheter Q12H  . vitamin C  500 mg Oral BID  . zinc sulfate  220 mg Oral Daily   Continuous Infusions: . sodium chloride 250 mL (10/31/18 2358)  .  ceFAZolin (ANCEF) IV     PRN Meds:.sodium chloride, acetaminophen (TYLENOL) oral liquid 160 mg/5 mL, ALPRAZolam, bisacodyl, docusate, Gerhardt's butt cream, ondansetron, sodium chloride flush  Current Labs: reviewed    Physical Exam:  Blood pressure (!) 99/33, pulse (!) 58, temperature 98.7 F (37.1 C), temperature source Oral, resp. rate 20, height 5' (1.524 m), weight 103.3 kg, SpO2 96 %. Chronically ill-appearing, on trach collar with Passy-Muir valve RRR, normal S1 and S2 CTA B Left BC AV fistula with bruit and thrill, right IJ tunneled dialysis catheter without erythema or  purulence No edema Hirsute EOMI  A 1. Dialysis dependent AKI, using right IJ TDC, status post left VC AV fistula on 11/15/2018; on MWF schedule no evidence for recovery 2. Chronic respiratory failure with tracheostomy, pulmonary following 3. Previous PEA arrest, stable 4. Anemia, last T sat 12% on 6/17, on Aranesp 200 mcg weekly 5. CKD-BMD, last PTH 33 10/12/2018; phosphorus and calcium stable 6. Status post cefepime for Proteus pneumonia, stopped 7/8 7. Diastolic heart failure  P . HD tomorrow on schedule: 2K, TDC, 4h, 1.5L UF, No heparin . Medication Issues; o Preferred narcotic agents for pain control are hydromorphone, fentanyl, and methadone. Morphine should not be used.  o Baclofen should be avoided o Avoid oral sodium phosphate and magnesium citrate based laxatives / bowel preps    Pearson Grippe MD 11/15/2018, 12:48 PM  Recent Labs  Lab 11/12/18 0447 11/13/18 0511 11/14/18 0415 11/15/18 0431  NA 132* 133* 129* 133*  K 4.2 4.3 4.9 4.3  CL 97* 97* 93* 98  CO2 25 26 24 27   GLUCOSE 166* 195* 128* 139*  BUN 37* 27* 42* 21  CREATININE 3.56* 2.87* 3.95* 2.50*  CALCIUM 8.5* 8.6* 9.1 8.7*  PHOS 2.8 2.5 3.0  --    Recent Labs  Lab 11/11/18 0413 11/12/18 0447 11/14/18 1253  WBC 4.6 5.5 7.7  HGB 8.1* 8.4* 8.5*  HCT 28.2* 28.8* 29.4*  MCV 97.2 98.6 102.1*  PLT 145* 137* 108*

## 2018-11-15 NOTE — Care Management Important Message (Signed)
Important Message  Patient Details  Name: Kathleen [name] MRN: [id number] Date of Birth: [birth year]   Medicare Important Message Given:  Yes     Memory Argue 11/15/2018, 3:20 PM

## 2018-11-15 NOTE — Op Note (Signed)
    Patient name: Kathleen [name] MRN: [id number] DOB: 1950-12-10 Sex: female  11/15/2018 Pre-operative Diagnosis: End-stage renal disease Post-operative diagnosis:  Same Surgeon:  Erlene Quan C. Donzetta Matters, MD Assistant: Leontine Locket, PA Procedure Performed: Left arm brachiocephalic AV fistula creation  Indications: 68 year old female with end-stage renal disease.  She is on dialysis via tunneled catheter on the right.  She is indicated for permanent access.  Have suitable cephalic vein throughout her left upper extremity.  She is right-hand dominant.  Findings: There is a very large cephalic vein at the antecubitum measuring 5 mm externally.  There were some areas of previous cannulation with IVs these were repaired primarily.  Brachial artery did have some disease but was 4 mm external diameter with strong pulsatility.  There is no palpable radial pulse the wrist or signal and so brachiocephalic AV fistula was created.  At completion there was good thrill femoral vein and a strong ulnar artery signal at the wrist that did augment with compression of the vein.   Procedure:  The patient was identified in the holding area and taken to the operating room where she is placed upon operative when general anesthesia induced.  She was to the prepped draped left upper extremity usual fashion, antibiotics were administered, a timeout was called.  I could not palpate a radial pulse.  I could palpate a strong brachial pulse.  Incision was made between the obvious palpable brachial pulse and the cephalic vein above the antecubitum.  I dissected out the vein.  There were a few areas that need to be repaired previous IV cannulation sites.  Branches were taken between ties.  Vein was more for orientation.  We dissected through the deep fascia identified the brachial artery placed a vessel loop around it.  Vein was transected distally tied off flush with heparinized saline spatulated and clamped.  Arteries clamped this  approximate opened longitudinally flushed with saline by directions.  Vein was sewn inside with 6-0 Prolene suture.  Prior to completion anastomosis we allowed flushing all directions.  Upon completion there was a good thrill in the vein.  There was a good signal of the ulnar artery that did augment with compression of the vein.  Hand appears well-perfused.  We obtain hemostasis irrigated the wound closed in layers Vicryl Monocryl.  Dermabond placed to level skin.  She was awakened anesthesia having tolerated procedure well immediate complication.  All counts were correct at completion.  EBL: 20 cc   Sabriya Yono C. Donzetta Matters, MD [facility] [place] [facility]  Office: [phone] Pager: [phone]

## 2018-11-15 NOTE — Progress Notes (Signed)
  Progress Note    11/15/2018 7:19 AM Day of Surgery  Subjective:  No overnight issues  Vitals:   11/15/18 0003 11/15/18 0340  BP: (!) 114/39 (!) 104/45  Pulse: (!) 52 (!) 49  Resp: 19 (!) 22  Temp: 98 F (36.7 C) 98.2 F (36.8 C)  SpO2: 98% 96%    Physical Exam: Awake and alert Trach in place Palpable left brachial, no radial pulse on the left  CBC    Component Value Date/Time   WBC 7.7 11/14/2018 1253   RBC 2.88 (L) 11/14/2018 1253   HGB 8.5 (L) 11/14/2018 1253   HGB 11.5 06/17/2018 1401   HCT 29.4 (L) 11/14/2018 1253   HCT 37.8 06/17/2018 1401   PLT 108 (L) 11/14/2018 1253   PLT 135 (L) 06/17/2018 1401   MCV 102.1 (H) 11/14/2018 1253   MCV 89 06/17/2018 1401   MCH 29.5 11/14/2018 1253   MCHC 28.9 (L) 11/14/2018 1253   RDW 26.7 (H) 11/14/2018 1253   RDW 15.3 06/17/2018 1401   LYMPHSABS 1.7 10/20/2018 0417   LYMPHSABS 1.7 01/22/2015 1217   MONOABS 2.2 (H) 10/20/2018 0417   EOSABS 0.5 10/20/2018 0417   EOSABS 0.2 01/22/2015 1217   BASOSABS 0.0 10/20/2018 0417   BASOSABS 0.0 01/22/2015 1217    BMET    Component Value Date/Time   NA 133 (L) 11/15/2018 0431   NA 143 09/13/2018 1203   K 4.3 11/15/2018 0431   CL 98 11/15/2018 0431   CO2 27 11/15/2018 0431   GLUCOSE 139 (H) 11/15/2018 0431   BUN 21 11/15/2018 0431   BUN 75 (H) 09/13/2018 1203   CREATININE 2.50 (H) 11/15/2018 0431   CREATININE 1.60 (H) 04/06/2016 1424   CALCIUM 8.7 (L) 11/15/2018 0431   GFRNONAA 19 (L) 11/15/2018 0431   GFRNONAA 65 06/07/2014 1027   GFRAA 22 (L) 11/15/2018 0431   GFRAA 75 06/07/2014 1027    INR    Component Value Date/Time   INR 1.2 10/13/2018 0259     Intake/Output Summary (Last 24 hours) at 11/15/2018 0719 Last data filed at 11/14/2018 2300 Gross per 24 hour  Intake 360 ml  Output 437 ml  Net -77 ml     Assessment/plan:  68 y.o. female is here with need for dialysis access. She has a tdc now. Plan for left arm avf today in OR.    Kathleen [name] C. Donzetta Matters, MD  [facility] [place] Office: [phone] Pager: [phone]  11/15/2018 7:19 AM

## 2018-11-15 NOTE — Discharge Instructions (Signed)
° °  [facility]  Discharge Instructions  AV Fistula or Graft Surgery for Dialysis Access  Please refer to the following instructions for your post-procedure care. Your surgeon or physician assistant will discuss any changes with you.  Activity  You may drive the day following your surgery, if you are comfortable and no longer taking prescription pain medication. Resume full activity as the soreness in your incision resolves.  Bathing/Showering  You may shower after you go home. Keep your incision dry for 48 hours. Do not soak in a bathtub, hot tub, or swim until the incision heals completely. You may not shower if you have a hemodialysis catheter.  Incision Care  Clean your incision with mild soap and water after 48 hours. Pat the area dry with a clean towel. You do not need a bandage unless otherwise instructed. Do not apply any ointments or creams to your incision. You may have skin glue on your incision. Do not peel it off. It will come off on its own in about one week. Your arm may swell a bit after surgery. To reduce swelling use pillows to elevate your arm so it is above your heart. Your doctor will tell you if you need to lightly wrap your arm with an ACE bandage.  Diet  Resume your normal diet. There are not special food restrictions following this procedure. In order to heal from your surgery, it is CRITICAL to get adequate nutrition. Your body requires vitamins, minerals, and protein. Vegetables are the best source of vitamins and minerals. Vegetables also provide the perfect balance of protein. Processed food has little nutritional value, so try to avoid this.  Medications  Resume taking all of your medications. If your incision is causing pain, you may take over-the counter pain relievers such as acetaminophen (Tylenol). If you were prescribed a stronger pain medication, please be aware these medications can cause nausea and constipation. Prevent  nausea by taking the medication with a snack or meal. Avoid constipation by drinking plenty of fluids and eating foods with high amount of fiber, such as fruits, vegetables, and grains.  Do not take Tylenol if you are taking prescription pain medications.  Follow up Your surgeon may want to see you in the office following your access surgery. If so, this will be arranged at the time of your surgery.  Please call us immediately for any of the following conditions:  Increased pain, redness, drainage (pus) from your incision site Fever of 101 degrees or higher Severe or worsening pain at your incision site Hand pain or numbness.  Reduce your risk of vascular disease:  Stop smoking. If you would like help, call [facility] at 1-800-QUIT-NOW 757-573-5629) or [facility] [place] your cholesterol Maintain a desired weight Control your diabetes Keep your blood pressure down  Dialysis  It will take several weeks to several months for your new dialysis access to be ready for use. Your surgeon will determine when it is okay to use it. Your nephrologist will continue to direct your dialysis. You can continue to use your Permcath until your new access is ready for use.   11/15/2018 Kathleen [name] [id number] 11/25/1950  Surgeon(s): Waynetta Sandy, MD  Procedure(s): Creation left brachiocephalic AV fistula  x Do not stick fistula for 12 weeks    If you have any questions, please call the office at [phone].

## 2018-11-15 NOTE — Progress Notes (Signed)
  Speech Language Pathology Treatment: Dysphagia;Passy Muir Speaking valve  Patient Details Name: Kathleen [name] MRN: [id number] DOB: [birth year] Today's Date: 11/15/2018 Time: 3729-0211 SLP Time Calculation (min) (ACUTE ONLY): 12 min  Assessment / Plan / Recommendation Clinical Impression  Pt was drowsy this afternoon but wore her PMV for 12 minutes under SLP supervision while consuming thin liquids and a few bites of soft solids. Her voice continues to improve in intensity, which facilitates intelligibility as well. No overt signs of aspiration were noted with PO intake with Min cues provided for small, controlled boluses and upright positioning. Note that solids had been advanced in chart - returned orders to chopped foods given lack of dentition. Will continue to follow.   HPI HPI: 68 y.o. female admitted 09/22/2018 for surgery. PMH: permanent atrial flutter with baseline bradycardia, hypertension, super morbid obesity, IIDDM, dyslipidemia, obesity hypoventilation syndrome, CKD stage IV, chronic diastolic CHF, chronic edema. PEA 09/24/2018. Intubated 5/30-09/28/2018, again 6/11until trach 6/19. SLP was seeing pt in between intubations with concern for post-extubation dysphagia and instrumental testing recommended, although this was not completed as pt had been reintubated.      SLP Plan  Continue with current plan of care       Recommendations  Diet recommendations: Dysphagia 2 (fine chop);Thin liquid Liquids provided via: Cup;Straw Medication Administration: Whole meds with puree Supervision: Staff to assist with self feeding;Full supervision/cueing for compensatory strategies Compensations: Slow rate;Small sips/bites;Minimize environmental distractions Postural Changes and/or Swallow Maneuvers: Seated upright 90 degrees;Upright 30-60 min after meal      Patient may use Passy-Muir Speech Valve: During all therapies with supervision;During PO intake/meals PMSV Supervision: Full          Oral Care Recommendations: Oral care BID Follow up Recommendations: LTACH SLP Visit Diagnosis: Aphonia (R49.1);Dysphagia, unspecified (R13.10) Plan: Continue with current plan of care       [place] Kathleen [name] 11/15/2018, 4:54 PM  Kathleen [name], M.A. [place] Acute Environmental education officer [phone] Office [phone]

## 2018-11-15 NOTE — Transfer of Care (Signed)
Immediate Anesthesia Transfer of Care Note  Patient: Kathleen [name]  Procedure(s) Performed: ARTERIOVENOUS (AV) FISTULA CREATION LEFT ARM (Left )  Patient Location: PACU  Anesthesia Type:General  Level of Consciousness: awake, alert  and oriented  Airway & Oxygen Therapy: Patient Spontanous Breathing and via ambu 100% waiting for RT for vent  Post-op Assessment: Report given to RN, Post -op Vital signs reviewed and stable and Patient moving all extremities  Post vital signs: Reviewed and stable  Last Vitals:  Vitals Value Taken Time  BP 99/45 11/15/18 0916  Temp 36.6 C 11/15/18 0900  Pulse 60 11/15/18 0915  Resp 26 11/15/18 0930  SpO2 90 % 11/15/18 0915  Vitals shown include unvalidated device data.  Last Pain:  Vitals:   11/15/18 0340  TempSrc: Axillary  PainSc:       Patients Stated Pain Goal: 0 (70/01/74 9449)  Complications: No apparent anesthesia complications

## 2018-11-15 NOTE — Progress Notes (Signed)
Transferred back from PACU opens eyes to name calling and back to sleep. On 100% NRM SAT AT 93 %. NOT IN ANY RESP. DISTRESS.

## 2018-11-15 NOTE — Progress Notes (Signed)
Pt transported to OR via bed, report given

## 2018-11-15 NOTE — Progress Notes (Signed)
Chart reviewed for LOS; B Sandie Swayze RN,MHA,BSN Advanced Care Supervisor [phone] 

## 2018-11-15 NOTE — Progress Notes (Signed)
NAME:  Kathleen [name], MRN:  [id number], DOB:  1950-12-21, LOS: 67 ADMISSION DATE:  09/22/2018, CONSULTATION DATE: 09/24/2018 REFERRING MD: Candee Furbish MD, CHIEF COMPLAINT: Cardiac arrest  Brief History   68 y/o obese woman with chronic diastolic heart failure who failed outpatient diuretics management due to chronic kidney disease.  Right heart cath on 5/29 showed markedly elevated biventricular filling pressures with normal cardiac output.  She was on cardiology service, maintained on milrinone and Lasix.  On the morning of 5/30 she went into PEA arrest, CPR performed for 8 minutes.  Intubated and transferred to ICU.  Started on CRRT 5/31.  Extubated 6/3.  Re-intubated early am 6/11 for hypercarbic respiratory failure and underwent tracheostomy on 6/19.  Past Medical History   has a past medical history of Atrial flutter ([place]), Bradycardia, Chronic diastolic (congestive) heart failure ([place]) (01/10/2015), CKD (chronic kidney disease), stage IV ([place]), Degenerative joint disease of hand, Diabetes mellitus, Dyslipidemia, Fecal occult blood test positive, GERD (gastroesophageal reflux disease), Headache, Hypertension, Inadequate material resources, Irritable bowel syndrome, Morbid obesity ([place]), Obesity hypoventilation syndrome ([place]), Post-menopausal bleeding, and Shortness of breath dyspnea.  Significant Hospital Events   5/28 Admit 5/29 RHC 5/30 PEA arrest, intubated/ CRRT 5/31 Milrinone stopped due to ectopy; brady episode- amio stopped 6/02 Attempt at SBT, chest x-ray with worsening pulmonary edema, CVVHD for volume removal 6/03 Extubated > remain on CRRT.  Refused nocturnal BiPAP 6/04 Refused nocturnal BiPAP 6/10 off CRRT 6/11 Intubated early am with hypercarbia, concern for RUL PNA; abx broadened; restarted on CRRT 6/16  No events overnight, weaning on high PS 6/17  failed vent wean, remains on levophed, midodrine and CRRT 6/18  Weaned for 5 hours on PSV 8/5 before requiring full support;  ongoing CRRT, remains on levophed 14 mcg 6/19 - TRACH + Dr Erskine Emery 6/20 -. On IV heparin gt, levophed gtt with midodrine. Off fent gtt. Pressopr needs down 6/26 - TCT tolerated for the past 24 hours  6/27 - Stable on TCT now >48hrs, tolerated first session of iHD  6/30 hypotension / bradycardia overnight, started on dopamine 7/2 Placed on ATC at 40% fiO2 7/6 Palliative care consult/ iHD 7/7 remains dependent on dopamine at 7, for bradycardia/ hypotension.   Palliative consult noted - family would like to visit 7/9 for goals of care meeting. 7/08 3.5 L off w/ iHD, weaned well all day, dopamine off late evening 7/9 great day, family meeting w/son and PMT, remains full code per patient 7/11 to ATC 7/13 remains on ATC 7/16 Trach change to 4 cuffless 7/17 Remains on 40% ATC 7/18 did tolerate swallowing yesterday  Consults:  Cardiology PCCM  Nephrology  EP  Procedures:  Lt PICC 5/29 >> 6/30 OETT 5/30 >> 6/3; 6/11 > 6/19 - TRACH (Dr Erskine Emery) >> R IJ HD cath 5/30 >> Aline left ulnar 5/30 >> 6/2 R nare cortrak 6/5 >> 7/13  Significant Diagnostic Tests:  TTE 5/28 >>The left ventricle has normal systolic function with an ejection fraction of 60-65%. . The right ventricle has normal systolic function. The cavity was mildly enlarged.   Right heart cath 5/29 RA = 24 RV = 94/26 PA = 95/36 (53) PCW = 30 (v = 50) Fick cardiac output/index = 6.0/2.7 PVR =3.9 WU FA sat = 91% PA sat = 58%, 58% SVC 60%  Micro Data:  SARS coronavirus 2 cepheid 5/28 >> neg MRSA PCR 5/28 >> neg Trach aspirate 6/2 >> MSSA BCx2 6/2 >> negative ...................... Tracheal aspirate 6/11 >>  few GNR >> few proteus and few candida tropicalis BCx2 6/11 >> negative 10/27/2018 tracheal aspirate>> gram-positive cocci gram-negative rods>>  Antimicrobials:  Vancomycin 6/3 > 6/4 Cefazolin 6/4 >> 6/10 ...........................Marland Kitchen Vanco 6/11 >> 6/17 Cefepime 6/11 >>6/18 Cefepime 10/27/2018>> 7/6  Interim  history/subjective:  Did not tolerate cap overnight Went for an AV fistula placement and desaturated significantly, back to 100% FiO2  Objective   Blood pressure (!) 99/33, pulse (!) 56, temperature 98.7 F (37.1 C), temperature source Oral, resp. rate 16, height 5' (1.524 m), weight 103.3 kg, SpO2 93 %.    FiO2 (%):  [40 %-60 %] 60 %   Intake/Output Summary (Last 24 hours) at 11/15/2018 1021 Last data filed at 11/15/2018 0867 Gross per 24 hour  Intake 510 ml  Output 437 ml  Net 73 ml   Filed Weights   11/14/18 1210 11/14/18 1619 11/15/18 0535  Weight: 104.3 kg 103.4 kg 103.3 kg   General: Chronically ill appearing female, NAD Neuro: Sedate but arousable, moving all ext to command HEENT: [place]/AT, PERRL, EOM-I and MMM, trach in place, cuffless 4 Lungs: Coarse bilaterally Heart: RRR, Nl S1/S2 and -M/R/G Abdomen: Soft, NT, ND and +BS Skin: Intact  I reviewed CXR myself, trach is in a good position  Assessment & Plan:   Respiratory failure status post tracheostomy On trach 4.0 cuffless, tolerating it well - Once more awake attempt to titrate O2 down for sat of 88-92$ - No further capping until FiO2 is at 28% - Trach care per protocol - Clearing secretions well  Dialysis dependent renal failure - Continue per nephrology, HD per renal  Proteus colonization versus bronchitis - Completed course of cefepime - Trend fever and WBC - Reculture as needed  Persistent shock after PEA arrest Bradycardia Atrial fibrillation/history of atrial flutter Acute on chronic diastolic heart failure - Not a candidate for EP procedures - Follow-up with primary  Nutrition - Appears to have tolerated swallowing well, no need for PEG  Goals of care. -Full code  Dysphagia:  - Able to maintain  ATC at at FiO2 of 40% over night with 4.0 cuff less trach - Swallow pending  Discussed with PCCM-NP.  Best practice:  Diet: Continue tube feeds Pain/Anxiety/Delirium protocol (if indicated): NA  VAP protocol (if indicated): In place  DVT prophylaxis: On heparin GI prophylaxis: Pepcid Glucose control: SSI Mobility: PT/ OT as tolerated Code Status: Full Disposition: ICU   Rush Farmer, M.D. [facility] Pulmonary/Critical Care Medicine. Pager: [phone]. After hours pager: [phone].

## 2018-11-15 NOTE — Plan of Care (Signed)
  Problem: Education: Goal: Knowledge of General Education information will improve Description: Including pain rating scale, medication(s)/side effects and non-pharmacologic comfort measures Outcome: Progressing   Problem: Health Behavior/Discharge Planning: Goal: Ability to manage health-related needs will improve Outcome: Progressing   Problem: Clinical Measurements: Goal: Ability to maintain clinical measurements within normal limits will improve Outcome: Progressing Goal: Will remain free from infection Outcome: Progressing Goal: Diagnostic test results will improve Outcome: Progressing Goal: Respiratory complications will improve Outcome: Progressing Goal: Cardiovascular complication will be avoided Outcome: Progressing   Problem: Coping: Goal: Level of anxiety will decrease Outcome: Progressing   Problem: Pain Managment: Goal: General experience of comfort will improve Outcome: Progressing   Problem: Skin Integrity: Goal: Risk for impaired skin integrity will decrease Outcome: Progressing   Problem: Education: Goal: Ability to demonstrate management of disease process will improve Outcome: Progressing Goal: Ability to verbalize understanding of medication therapies will improve Outcome: Progressing   Problem: Activity: Goal: Capacity to carry out activities will improve Outcome: Progressing   Problem: Cardiac: Goal: Ability to achieve and maintain adequate cardiopulmonary perfusion will improve Outcome: Progressing   Problem: Education: Goal: Knowledge about tracheostomy care/management will improve Outcome: Progressing   Problem: Activity: Goal: Ability to tolerate increased activity will improve Outcome: Progressing   Problem: Health Behavior/Discharge Planning: Goal: Ability to manage tracheostomy will improve Outcome: Progressing   Problem: Respiratory: Goal: Patent airway maintenance will improve Outcome: Progressing   Problem: Role  Relationship: Goal: Ability to communicate will improve Outcome: Progressing   Problem: Education: Goal: Knowledge of the prescribed therapeutic regimen will improve Outcome: Progressing   Problem: Coping: Goal: Ability to identify and develop effective coping behavior will improve Outcome: Progressing   Problem: Clinical Measurements: Goal: Quality of life will improve Outcome: Progressing   Problem: Role Relationship: Goal: Family's ability to cope with current situation will improve Outcome: Progressing Goal: Ability to verbalize concerns, feelings, and thoughts to partner or family member will improve Outcome: Progressing   Problem: Pain Management: Goal: Satisfaction with pain management regimen will improve Outcome: Progressing

## 2018-11-16 ENCOUNTER — Encounter (HOSPITAL_COMMUNITY): Payer: Self-pay | Admitting: Vascular Surgery

## 2018-11-16 LAB — CBC
HCT: 29.9 % — ABNORMAL LOW (ref 36.0–46.0)
Hemoglobin: 8.4 g/dL — ABNORMAL LOW (ref 12.0–15.0)
MCH: 28.9 pg (ref 26.0–34.0)
MCHC: 28.1 g/dL — ABNORMAL LOW (ref 30.0–36.0)
MCV: 102.7 fL — ABNORMAL HIGH (ref 80.0–100.0)
Platelets: 116 10*3/uL — ABNORMAL LOW (ref 150–400)
RBC: 2.91 MIL/uL — ABNORMAL LOW (ref 3.87–5.11)
RDW: 26.4 % — ABNORMAL HIGH (ref 11.5–15.5)
WBC: 9.1 10*3/uL (ref 4.0–10.5)
nRBC: 0.4 % — ABNORMAL HIGH (ref 0.0–0.2)

## 2018-11-16 LAB — BASIC METABOLIC PANEL
Anion gap: 12 (ref 5–15)
BUN: 40 mg/dL — ABNORMAL HIGH (ref 8–23)
CO2: 23 mmol/L (ref 22–32)
Calcium: 8.9 mg/dL (ref 8.9–10.3)
Chloride: 93 mmol/L — ABNORMAL LOW (ref 98–111)
Creatinine, Ser: 3.91 mg/dL — ABNORMAL HIGH (ref 0.44–1.00)
GFR calc Af Amer: 13 mL/min — ABNORMAL LOW (ref >60)
GFR calc non Af Amer: 11 mL/min — ABNORMAL LOW (ref >60)
Glucose, Bld: 248 mg/dL — ABNORMAL HIGH (ref 70–99)
Potassium: 5.4 mmol/L — ABNORMAL HIGH (ref 3.5–5.1)
Sodium: 128 mmol/L — ABNORMAL LOW (ref 135–145)

## 2018-11-16 LAB — GLUCOSE, CAPILLARY
Glucose-Capillary: 162 mg/dL — ABNORMAL HIGH (ref 70–99)
Glucose-Capillary: 187 mg/dL — ABNORMAL HIGH (ref 70–99)
Glucose-Capillary: 229 mg/dL — ABNORMAL HIGH (ref 70–99)
Glucose-Capillary: 246 mg/dL — ABNORMAL HIGH (ref 70–99)
Glucose-Capillary: 260 mg/dL — ABNORMAL HIGH (ref 70–99)

## 2018-11-16 MED ORDER — PENTAFLUOROPROP-TETRAFLUOROETH EX AERO: 1.0000 "application " | INHALATION_SPRAY | CUTANEOUS | Status: DC | PRN

## 2018-11-16 MED ORDER — DARBEPOETIN ALFA 200 MCG/0.4ML IJ SOSY
PREFILLED_SYRINGE | INTRAMUSCULAR | Status: AC
  Administered 2018-11-16: 17:00:00 200 ug via SUBCUTANEOUS
  Filled 2018-11-16: qty 0.4

## 2018-11-16 MED ORDER — INSULIN DETEMIR 100 UNIT/ML ~~LOC~~ SOLN
15.0000 [IU] | Freq: Every day | SUBCUTANEOUS | Status: DC
  Administered 2018-11-16: 15 [IU] via SUBCUTANEOUS
  Filled 2018-11-16 (×2): qty 0.15

## 2018-11-16 MED ORDER — POLYETHYLENE GLYCOL 3350 17 G PO PACK: 17.0000 g | PACK | Freq: Every day | ORAL | Status: DC | PRN

## 2018-11-16 MED ORDER — MIDODRINE HCL 5 MG PO TABS
ORAL_TABLET | ORAL | Status: AC
  Administered 2018-11-16: 17:00:00 10 mg via ORAL
  Filled 2018-11-16: qty 2

## 2018-11-16 MED ORDER — ATROPINE SULFATE 1 MG/10ML IJ SOSY
PREFILLED_SYRINGE | INTRAMUSCULAR | Status: AC
  Filled 2018-11-16: qty 10

## 2018-11-16 MED ORDER — INSULIN ASPART 100 UNIT/ML ~~LOC~~ SOLN: 0.0000 [IU] | Freq: Every day | SUBCUTANEOUS | Status: DC

## 2018-11-16 MED ORDER — LIDOCAINE-PRILOCAINE 2.5-2.5 % EX CREA: 1.0000 "application " | TOPICAL_CREAM | CUTANEOUS | Status: DC | PRN

## 2018-11-16 MED ORDER — INSULIN ASPART 100 UNIT/ML ~~LOC~~ SOLN
0.0000 [IU] | Freq: Three times a day (TID) | SUBCUTANEOUS | Status: DC
  Administered 2018-11-16: 5 [IU] via SUBCUTANEOUS
  Administered 2018-11-16: 3 [IU] via SUBCUTANEOUS
  Administered 2018-11-17: 2 [IU] via SUBCUTANEOUS
  Administered 2018-11-17: 5 [IU] via SUBCUTANEOUS

## 2018-11-16 MED ORDER — SODIUM CHLORIDE 0.9 % IV BOLUS
250.0000 mL | Freq: Once | INTRAVENOUS | Status: AC
  Administered 2018-11-16: 500 mL via INTRAVENOUS

## 2018-11-16 MED ORDER — SODIUM CHLORIDE 0.9 % IV SOLN: 100.0000 mL | INTRAVENOUS | Status: DC | PRN

## 2018-11-16 MED ORDER — ALTEPLASE 2 MG IJ SOLR: 2.0000 mg | Freq: Once | INTRAMUSCULAR | Status: DC | PRN

## 2018-11-16 MED ORDER — HEPARIN SODIUM (PORCINE) 1000 UNIT/ML DIALYSIS: 1000.0000 [IU] | INTRAMUSCULAR | Status: DC | PRN

## 2018-11-16 MED ORDER — LIDOCAINE HCL (PF) 1 % IJ SOLN: 5.0000 mL | INTRAMUSCULAR | Status: DC | PRN

## 2018-11-16 NOTE — Progress Notes (Addendum)
Obtained EKG to clarify rhythm.  No change from previous EKG. Junctional with possible Anterlateral infarct.    Hr 48.  Pt c/o little dizziness . bp 89/40. Will notify md. Will continue to monitor [facility] T

## 2018-11-16 NOTE — Progress Notes (Signed)
NAME:  Kathleen [name], MRN:  [id number], DOB:  1950-08-09, LOS: 13 ADMISSION DATE:  09/22/2018, CONSULTATION DATE: 09/24/2018 REFERRING MD: Candee Furbish MD, CHIEF COMPLAINT: Cardiac arrest  Brief History   68 y/o obese woman with chronic diastolic heart failure who failed outpatient diuretics management due to chronic kidney disease.  Right heart cath on 5/29 showed markedly elevated biventricular filling pressures with normal cardiac output.  She was on cardiology service, maintained on milrinone and Lasix.  On the morning of 5/30 she went into PEA arrest, CPR performed for 8 minutes.  Intubated and transferred to ICU.  Started on CRRT 5/31.  Extubated 6/3.  Re-intubated early am 6/11 for hypercarbic respiratory failure and underwent tracheostomy on 6/19.  Past Medical History   has a past medical history of Atrial flutter ([place]), Bradycardia, Chronic diastolic (congestive) heart failure ([place]) (01/10/2015), CKD (chronic kidney disease), stage IV ([place]), Degenerative joint disease of hand, Diabetes mellitus, Dyslipidemia, Fecal occult blood test positive, GERD (gastroesophageal reflux disease), Headache, Hypertension, Inadequate material resources, Irritable bowel syndrome, Morbid obesity ([place]), Obesity hypoventilation syndrome ([place]), Post-menopausal bleeding, and Shortness of breath dyspnea.  Significant Hospital Events   5/28 Admit 5/29 RHC 5/30 PEA arrest, intubated/ CRRT 5/31 Milrinone stopped due to ectopy; brady episode- amio stopped 6/02 Attempt at SBT, chest x-ray with worsening pulmonary edema, CVVHD for volume removal 6/03 Extubated > remain on CRRT.  Refused nocturnal BiPAP 6/04 Refused nocturnal BiPAP 6/10 off CRRT 6/11 Intubated early am with hypercarbia, concern for RUL PNA; abx broadened; restarted on CRRT 6/16  No events overnight, weaning on high PS 6/17  failed vent wean, remains on levophed, midodrine and CRRT 6/18  Weaned for 5 hours on PSV 8/5 before requiring full support;  ongoing CRRT, remains on levophed 14 mcg 6/19 - TRACH + Dr Erskine Emery 6/20 -. On IV heparin gt, levophed gtt with midodrine. Off fent gtt. Pressopr needs down 6/26 - TCT tolerated for the past 24 hours  6/27 - Stable on TCT now >48hrs, tolerated first session of iHD  6/30 hypotension / bradycardia overnight, started on dopamine 7/2 Placed on ATC at 40% fiO2 7/6 Palliative care consult/ iHD 7/7 remains dependent on dopamine at 7, for bradycardia/ hypotension.   Palliative consult noted - family would like to visit 7/9 for goals of care meeting. 7/08 3.5 L off w/ iHD, weaned well all day, dopamine off late evening 7/9 great day, family meeting w/son and PMT, remains full code per patient 7/11 to ATC 7/13 remains on ATC 7/16 Trach change to 4 cuffless 7/17 Remains on 40% ATC 7/18 did tolerate swallowing yesterday  Consults:  Cardiology PCCM  Nephrology  EP  Procedures:  Lt PICC 5/29 >> 6/30 OETT 5/30 >> 6/3; 6/11 > 6/19 - TRACH (Dr Erskine Emery) >> R IJ HD cath 5/30 >> Aline left ulnar 5/30 >> 6/2 R nare cortrak 6/5 >> 7/13  Significant Diagnostic Tests:  TTE 5/28 >>The left ventricle has normal systolic function with an ejection fraction of 60-65%. . The right ventricle has normal systolic function. The cavity was mildly enlarged.   Right heart cath 5/29 RA = 24 RV = 94/26 PA = 95/36 (53) PCW = 30 (v = 50) Fick cardiac output/index = 6.0/2.7 PVR =3.9 WU FA sat = 91% PA sat = 58%, 58% SVC 60%  Micro Data:  SARS coronavirus 2 cepheid 5/28 >> neg MRSA PCR 5/28 >> neg Trach aspirate 6/2 >> MSSA BCx2 6/2 >> negative ...................... Tracheal aspirate 6/11 >>  few GNR >> few proteus and few candida tropicalis BCx2 6/11 >> negative 10/27/2018 tracheal aspirate>> gram-positive cocci gram-negative rods>>  Antimicrobials:  Vancomycin 6/3 > 6/4 Cefazolin 6/4 >> 6/10 ...........................Marland Kitchen Vanco 6/11 >> 6/17 Cefepime 6/11 >>6/18 Cefepime 10/27/2018>> 7/6  Interim  history/subjective:  No events overnight, no new complaints  Objective   Blood pressure (!) 97/43, pulse (!) 54, temperature 98.2 F (36.8 C), temperature source Axillary, resp. rate (!) 25, height 5' (1.524 m), weight 103.3 kg, SpO2 98 %.    FiO2 (%):  [40 %-60 %] 50 %   Intake/Output Summary (Last 24 hours) at 11/16/2018 1053 Last data filed at 11/16/2018 0825 Gross per 24 hour  Intake 750 ml  Output -  Net 750 ml   Filed Weights   11/14/18 1210 11/14/18 1619 11/15/18 0535  Weight: 104.3 kg 103.4 kg 103.3 kg   General: Chronically ill appearing female, NAD Neuro: Alert and interactive, moving all ext to command HEENT: [place]/AT, PERRL, EOM-I and MMM, trach in place, cuffless 4 Lungs: CTA-B Heart: RRR, Nl S1/S2 and -M/R/G Abdomen: Soft, NT, ND and +BS Skin: Intact  I reviewed CXR myself, trach is in a good position  Assessment & Plan:   Respiratory failure status post tracheostomy On trach 4.0 cuffless, tolerating it well - Titrate O2 for sat of 88-92% - Goal is FiO2 of 28% - Trach per protocol - Clearing secretions well  Dialysis dependent renal failure - Continue per nephrology, HD per renal  Proteus colonization versus bronchitis - Completed course of cefepime - Trend fever and WBC - Reculture as needed  Persistent shock after PEA arrest Bradycardia Atrial fibrillation/history of atrial flutter Acute on chronic diastolic heart failure - Not a candidate for EP procedures - Follow-up with primary  Nutrition - Appears to have tolerated swallowing well, no need for PEG  Goals of care. - Full code  Dysphagia:  - PMV in place - Passed swallow evaluation  Discussed with PCCM-NP  Best practice:  Diet: Continue tube feeds Pain/Anxiety/Delirium protocol (if indicated): NA VAP protocol (if indicated): In place  DVT prophylaxis: On heparin GI prophylaxis: Pepcid Glucose control: SSI Mobility: PT/ OT as tolerated Code Status: Full Disposition: ICU   Rush Farmer, M.D. [facility] Pulmonary/Critical Care Medicine. Pager: [phone]. After hours pager: [phone].

## 2018-11-16 NOTE — Progress Notes (Signed)
PROGRESS NOTE    Kathleen [name]  SLH:[id number] DOB: 1950/05/14 DOA: 09/22/2018 PCP: Aldine Contes, MD   Brief Narrative:  68 year old female presented to the outpatient cardiology clinic for follow-up. She does have significant past medical history for permanent atrial fibrillation, hypertension, morbid obesity, type 2 diabetes mellitus, dyslipidemia, obesity hypoventilation, chronic kidney disease stage V, anddiastolic heart failure. She reported orthopnea, increased dyspnea on exertion, and increased lower extremity edema. Patient failed for outpatient diuretic management, and she was directly admitted to the hospital for further evaluation.  She had a prolonged hospitalization. Patient was initially placed on milrinone and furosemide. May 30 she suffered from a cardiac arrest, pulseless electrical activity. She recovered spontaneous circulation after 8 minutes of CPR. She was intubated and supported with mechanical ventilation.  She had worsening kidney function required CRRT May 31. She was extubated June 3, and reintubated June 11 for hypercapnic respiratory failure. She remainedventilator dependent respiratory failureand on June 19 underwent tracheostomy.  She required vasopressors and inotropes for hemodynamic decompensation.  She was transferred to [facility] on July 12.  11/10/18 Trach changed to cuffless #4, off dopamine.   11/11/18 Transferred to progressive care unit.  Patient has passed her swallow evaluation, trach changed to #4 cuffless, diet has been advanced to dysphagia diet to.  Now status permanent access for HD as well as left brachiocephalic AV fistula which will be used later.  11/15/18 Waiting for placement at SNF.    Consultants:   PCCM  Nephrology  Procedures:   Intubation/extubation  Tracheostomy  Antimicrobials:   None currently.  Completed course of antibiotics for pneumonia.   Subjective: Patient seen and examined.   Patient with trach collar and currently on 40% FiO2.  She has no complaints.  She is pleasant and comfortable.  Objective: Vitals:   11/16/18 0018 11/16/18 0243 11/16/18 0824 11/16/18 0828  BP: (!) 112/35 (!) 93/33 (!) 97/43 (!) 97/43  Pulse: (!) 53 (!) 58 (!) 54   Resp: (!) 26 (!) 22 (!) 25   Temp: (!) 97.5 F (36.4 C) 98 F (36.7 C) 98.2 F (36.8 C)   TempSrc:  Oral Axillary   SpO2: 100% 99% 98%   Weight:      Height:        Intake/Output Summary (Last 24 hours) at 11/16/2018 1059 Last data filed at 11/16/2018 0825 Gross per 24 hour  Intake 750 ml  Output -  Net 750 ml   Filed Weights   11/14/18 1210 11/14/18 1619 11/15/18 0535  Weight: 104.3 kg 103.4 kg 103.3 kg    Examination:  General exam: Appears calm and comfortable , has tracheostomy Respiratory system: Crackles in the middle and bases bilaterally ,respiratory effort normal. Cardiovascular system: S1 & S2 heard, irregularly irregular rate and rhythm. No JVD, murmurs, rubs, gallops or clicks.  +1 pitting edema bilateral lower extremity Gastrointestinal system: Abdomen is nondistended, soft and nontender. No organomegaly or masses felt. Normal bowel sounds heard. Central nervous system: Alert and oriented. No focal neurological deficits. Extremities: Symmetric 5 x 5 power. Skin: No rashes, lesions or ulcers Psychiatry: Judgement and insight appear normal. Mood & affect appropriate.    Data Reviewed: I have personally reviewed following labs and imaging studies  CBC: Recent Labs  Lab 11/09/18 1727 11/10/18 0502 11/11/18 0413 11/12/18 0447 11/14/18 1253  WBC 5.6 5.1 4.6 5.5 7.7  HGB 8.6* 8.6* 8.1* 8.4* 8.5*  HCT 29.2* 29.2* 28.2* 28.8* 29.4*  MCV 97.0 99.3 97.2 98.6 102.1*  PLT 158 158  145* 137* 573*   Basic Metabolic Panel: Recent Labs  Lab 11/10/18 0502 11/11/18 0413 11/12/18 0447 11/13/18 0511 11/14/18 0415 11/15/18 0431 11/16/18 0330  NA 133* 132* 132* 133* 129* 133* 128*  K 3.9 3.8 4.2 4.3  4.9 4.3 5.4*  CL 95* 96* 97* 97* 93* 98 93*  CO2 _0 GLUCOSE 189* 195* 166* 195* 128* 139* 248*  BUN 31* 45* 37* 27* 42* 21 40*  CREATININE 3.34* 4.36* 3.56* 2.87* 3.95* 2.50* 3.91*  CALCIUM 8.3* 8.6* 8.5* 8.6* 9.1 8.7* 8.9  MG 2.2  --  2.2  --   --   --   --   PHOS 3.3 4.4 2.8 2.5 3.0  --   --    GFR: Estimated Creatinine Clearance: 15.1 mL/min (A) (by C-G formula based on SCr of 3.91 mg/dL (H)). Liver Function Tests: Recent Labs  Lab 11/10/18 0502 11/11/18 0413 11/12/18 0447 11/13/18 0511 11/14/18 0415  ALBUMIN 2.4* 2.3* 2.7* 2.8* 2.7*   No results for input(s): LIPASE, AMYLASE in the last 168 hours. No results for input(s): AMMONIA in the last 168 hours. Coagulation Profile: No results for input(s): INR, PROTIME in the last 168 hours. Cardiac Enzymes: No results for input(s): CKTOTAL, CKMB, CKMBINDEX, TROPONINI in the last 168 hours. BNP (last 3 results) No results for input(s): PROBNP in the last 8760 hours. HbA1C: No results for input(s): HGBA1C in the last 72 hours. CBG: Recent Labs  Lab 11/15/18 1119 11/15/18 1623 11/15/18 2036 11/16/18 0025 11/16/18 0309  GLUCAP 184* 315* 259* 260* 229*   Lipid Profile: No results for input(s): CHOL, HDL, LDLCALC, TRIG, CHOLHDL, LDLDIRECT in the last 72 hours. Thyroid Function Tests: No results for input(s): TSH, T4TOTAL, FREET4, T3FREE, THYROIDAB in the last 72 hours. Anemia Panel: No results for input(s): VITAMINB12, FOLATE, FERRITIN, TIBC, IRON, RETICCTPCT in the last 72 hours. Sepsis Labs: No results for input(s): PROCALCITON, LATICACIDVEN in the last 168 hours.  Recent Results (from the past 240 hour(s))  Surgical pcr screen     Status: Abnormal   Collection Time: 11/14/18  6:36 PM   Specimen: Nasal Mucosa; Nasal Swab  Result Value Ref Range Status   MRSA, PCR NEGATIVE NEGATIVE Final   Staphylococcus aureus POSITIVE (A) NEGATIVE Final    Comment: (NOTE) The Xpert SA Assay (FDA approved for  NASAL specimens in patients 27 years of age and older), is one component of a comprehensive surveillance program. It is not intended to diagnose infection nor to guide or monitor treatment. Performed at [facility], [place] [address], [place], [place] [address]       Radiology Studies: No results found.  Scheduled Meds: . aspirin  81 mg Oral Daily  . atorvastatin  40 mg Oral q1800  . chlorhexidine gluconate (MEDLINE KIT)  15 mL Mouth Rinse BID  . Chlorhexidine Gluconate Cloth  6 each Topical Daily  . darbepoetin (ARANESP) injection - NON-DIALYSIS  200 mcg Subcutaneous Q Wed-1800  . famotidine  20 mg Oral Daily  . feeding supplement (PRO-STAT SUGAR FREE 64)  30 mL Oral BID  . Gerhardt's butt cream   Topical BID  . guaiFENesin  15 mL Oral Q6H  . insulin aspart  0-15 Units Subcutaneous TID WC  . insulin aspart  0-5 Units Subcutaneous QHS  . insulin detemir  15 Units Subcutaneous QHS  . midodrine  10 mg Oral TID WC  . multivitamin  1 tablet Oral QHS  . mupirocin ointment  1 application Nasal BID  . sodium chloride flush  10-40 mL Intracatheter Q12H  . vitamin C  500 mg Oral BID  . zinc sulfate  220 mg Oral Daily   Continuous Infusions: . sodium chloride 250 mL (10/31/18 2358)     LOS: 55 days   Assessment & Plan:   Active Problems:   Type 2 diabetes mellitus with other specified complication (HCC)   Dyslipidemia   OBESITY   Acute on chronic diastolic CHF (congestive heart failure) (HCC)   Acute kidney injury superimposed on chronic kidney disease (HCC)   Acute respiratory failure with hypoxia (HCC)   VAP (ventilator-associated pneumonia) (HCC)   Shock ([place])   Acute respiratory failure (HCC)   CHF (congestive heart failure) ([place])   Palliative care encounter   Dysphagia   Tracheostomy status ([place])   1.Acute respiratory failure, hypoxic andhypercapnic. off mechanical ventilation since June 11.Patient now on 40% FiO2.  PCCM managing.  Goal is to reduce to 28%   1 Con maintaining 88 to 92% oxygen saturation. tinue oxymetry monitoring.   2. Diastolic heart failure with hypotension/ LV EF 60 to 65%). Systolic blood pressure has been 90 to 100, continue midodrine.  Currently has crackles at the bases and middle lobes bilaterally.  Volume management per HD.  3. Stage V CKD due to ATN, related to shock.Transitioned to regular HD on June 27, getting HD M-W-F. Patient now has a left arm brachiocephalic AV fistula. Continue HD per nephrology recommendations, per right tunneled HD catheter. Patient continue anuric.   Patient is scheduled to have dialysis today.  4. Swallow dysfunction.SLP on board.  Now on dysphagia diet 2.  Tolerating diet well.  Continue SLP evaluation and advance diet as tolerated.  5. Chronic atrial fibrillation/thrombocytopenia. Continues to remain bradycardic at with heart rates in 50s range.  Continue to hold anticoagulation due to vaginal bleeding which has stopped but now she also has some thrombocytopenia so I will continue to hold anticoagulation for 1 more day and reassess tomorrow.     6. T2DM. Now that she is eating and her blood sugar has been slightly elevated so I will increase her Levemir to 15 units and change her sliding scale to [facility] at bedtime  7. Chronic anemia with iron deficiency.   Hemoglobin is stable.  8. Morbid obesitywith dysplidemia.Continue statin therapy, her calculated BMIis42.  9. Ventilator associated pneumonia (not present on admission). Proteus. Completed antibiotic therapy on 07/08.Cuffless trach #4,Continue oxymetry monitoring, aspiration precautions. Follow with pulmonary for future trach recommendations.   PCCM on board and managing.  DVT prophylaxis: SCD Code Status: Full code Family Communication: No family present at bedside. Disposition Plan: Needs placement at SNF.  Due to borderline blood pressure, will keep in progressive care for another day.   Time spent: 40 minutes    Darliss Cheney, MD [facility] Pager [phone]  If 7PM-7AM, please contact night-coverage [email or web address] Password [facility] 11/16/2018, 10:59 AM

## 2018-11-16 NOTE — Progress Notes (Signed)
Inpatient Diabetes Program Recommendations  AACE/ADA: New Consensus Statement on Inpatient Glycemic Control   Target Ranges:  Prepandial:   less than 140 mg/dL      Peak postprandial:   less than 180 mg/dL (1-2 hours)      Critically ill patients:  140 - 180 mg/dL   Results for Kathleen [name], Kathleen [name] (MRN [id number]) as of 11/16/2018 10:35  Ref. Range 11/15/2018 08:57 11/15/2018 11:19 11/15/2018 16:23 11/15/2018 20:36 11/16/2018 00:25 11/16/2018 03:09  Glucose-Capillary Latest Ref Range: 70 - 99 mg/dL 135 (H) 184 (H) 315 (H) 259 (H) 260 (H) 229 (H)   Review of Glycemic Control  Diabetes history: DM2 Outpatient Diabetes medications: Levemir 65 units daily, Victoza 1.8 mg daily, Metformin 500 mg BID Current orders for Inpatient glycemic control: Levemir 10 units QHS, Novolog 0-20 units Q4H  Inpatient Diabetes Program Recommendations:   Insulin - Basal: Please consider increasing Levemir to 15 units QHS.  Insulin - Meal Coverage: Please consider ordering Novolog 5 units TID with meals for meal coverage if patient eats at least 50% of meals.  Thanks, Barnie Alderman, RN, MSN, CDE Diabetes Coordinator Inpatient Diabetes Program [phone] (Team Pager from 8am to 5pm)

## 2018-11-16 NOTE — TOC Progression Note (Signed)
Transition of Care [facility]) - Progression Note    Patient Details  Name: Kathleen [name] MRN: [id number] Date of Birth: [birth year]  Transition of Care [facility]) CM/SW [place], [place] Phone Number: 11/16/2018, 3:11 PM  Clinical Narrative:    Received a call from Lenord Fellers, admissions coordinator at [facility], they have received approval for pt. CSW reached out to MD, they would like to keep pt, possible dc tomorrow. CSW updated [facility] liaison. Will f/u in morning for stability. Pt family member [name] was contacted by Raquel Sarna and Raquel Sarna states she will update her. CSW continuing to follow.    Expected Discharge Plan: Long Term Acute Care (LTAC) Barriers to Discharge: Continued Medical Work up, Orthoptist and Services Expected Discharge Plan: Long Term Acute Care (LTAC) In-house Referral: Hospice / Palliative Care Discharge Planning Services: CM Consult Post Acute Care Choice: Long Term Acute Care (LTAC) Living arrangements for the past 2 months: Single Family Home                 DME Arranged: N/A DME Agency: NA  HH Arranged: NA HH Agency: NA   Social Determinants of Health (SDOH) Interventions    Readmission Risk Interventions Readmission Risk Prevention Plan 11/04/2018 10/18/2018  Transportation Screening - Not Complete  Transportation Screening Comment LTACH pending insurance authorization Continued medical work-up; CIR pending  Medication Review Press photographer) - Complete  PCP or Specialist appointment within 3-5 days of discharge - Not Complete  PCP/Specialist Appt Not Complete comments LTACH pending insurance authorization Continued medical work-up; CIR pending  HRI or [place] - Complete  SW Recovery Care/Counseling Consult - Complete  Palliative Care Screening Complete Not [place] - Not Applicable  Some recent data might be hidden

## 2018-11-16 NOTE — Progress Notes (Signed)
Occupational Therapy Treatment Patient Details Name: Kathleen [name] MRN: [id number] DOB: 1950-08-19 Today's Date: 11/16/2018    History of present illness Kathleen [name] is a 68 y.o. female with history of permanent AF, hypertension, super morbid obesity, DM2, dyslipidemia, obesity hypoventilation syndrome (on home O2 with no OSA by prior PSG but showed nocturnal hypoxemia), CKD stage IV (baseline creatinine about 1.9), chronic diastolic CHF.  Admit for fluid overload. Also with pulmonary HTN.  PEA arrest 5/30, transfer to the ICU, extubater 6/3.  Intubated 10/06/18.  Trach on 10/14/18.     OT comments  Patient supine in bed and requires max encouragement to participate today.  Increased anxiety and self limiting behaviors today.  Verbal and tactile cueing to initiate even bed mobility today. Continues to require mod assist +2 for transfers using RW, impulsive at times attempting to sit before reaching chair. Refusing to engage in LB dressing tasks today.  Will follow.    Follow Up Recommendations  LTACH;SNF;Supervision/Assistance - 24 hour((pending medical progress))    Equipment Recommendations  3 in 1 bedside commode    Recommendations for Other Services      Precautions / Restrictions Precautions Precautions: Fall Precaution Comments: pt on trach collar  Restrictions Weight Bearing Restrictions: No       Mobility Bed Mobility Overal bed mobility: Needs Assistance Bed Mobility: Sidelying to Sit;Rolling Rolling: Supervision Sidelying to sit: Min assist Supine to sit: Mod assist Sit to supine: +2 for physical assistance;Mod assist   General bed mobility comments: pt able to complete supine to sidelying with cueing only, transition to sitting with min assist for trunk support  Transfers Overall transfer level: Needs assistance Equipment used: Rolling walker (2 wheeled) Transfers: Sit to/from [facility] Sit to Stand: Max assist;+2 physical assistance;+2  safety/equipment;Mod assist Stand pivot transfers: Mod assist;+2 physical assistance       General transfer comment: sit to stand from EOB with max cueing for forward scoot, hand placement and technique; pt with poor carryover of hand placement; poor initiation of steps during pivot requiring multimodal cueing with posterior lean and flexed posture as well and pt does not folllow commands consistently. Pt pivoted to recliner from bed with need for assist to support left LE for pt comfort and pt sitting on edge of the recliner.  Talked pt into ambulating a few steps after a rest break.     Balance Overall balance assessment: Needs assistance Sitting-balance support: Feet unsupported;No upper extremity supported Sitting balance-Leahy Scale: Fair Sitting balance - Comments: close supervision statically  Postural control: Posterior lean Standing balance support: Bilateral upper extremity supported;During functional activity Standing balance-Leahy Scale: Poor Standing balance comment: reliant on BUE and external support                           ADL either performed or assessed with clinical judgement   ADL Overall ADL's : Needs assistance/impaired                     Lower Body Dressing: Maximal assistance Lower Body Dressing Details (indicate cue type and reason): pt refuses to attempt to don socks, +2 sit <>stand and requires B hand support in standing  Toilet Transfer: Maximal assistance;+2 for physical assistance;+2 for safety/equipment;Ambulation;RW Toilet Transfer Details (indicate cue type and reason): simulated to recliner          Functional mobility during ADLs: Maximal assistance;+2 for physical assistance;+2 for safety/equipment;Rolling walker General ADL Comments:  pt self limiting     Vision       Perception     Praxis      Cognition Arousal/Alertness: Awake/alert Behavior During Therapy: Anxious Overall Cognitive Status: Difficult to assess                                  General Comments: used PMSV to communicate, requires cueing to voice words versus "mouthing"         Exercises General Exercises - Lower Extremity Long Arc Quad: AROM;Both;10 reps;Seated Heel Slides: AROM;Both;Supine;5 reps   Shoulder Instructions       General Comments 60% FiO2 at rest,  trach collar setup only goes up to 55% FiO2 therefore placed pt on 55% for transfer/mobility.    Pertinent Vitals/ Pain       Pain Assessment: Faces Faces Pain Scale: Hurts even more Pain Location: left LE Pain Descriptors / Indicators: Grimacing;Guarding;Sore Pain Intervention(s): Limited activity within patient's tolerance;Monitored during session;Repositioned  Home Living                                          Prior Functioning/Environment              Frequency  Min 2X/week        Progress Toward Goals  OT Goals(current goals can now be found in the care plan section)  Progress towards OT goals: Progressing toward goals  Acute Rehab OT Goals Patient Stated Goal: to go home OT Goal Formulation: With patient  Plan Discharge plan remains appropriate;Frequency remains appropriate    Co-evaluation    PT/OT/SLP Co-Evaluation/Treatment: Yes Reason for Co-Treatment: Complexity of the patient's impairments (multi-system involvement) PT goals addressed during session: Mobility/safety with mobility OT goals addressed during session: ADL's and self-care;Other (comment)(mobility)      AM-PAC OT "6 Clicks" Daily Activity     Outcome Measure   Help from another person eating meals?: A Little Help from another person taking care of personal grooming?: A Little Help from another person toileting, which includes using toliet, bedpan, or urinal?: Total Help from another person bathing (including washing, rinsing, drying)?: A Lot Help from another person to put on and taking off regular upper body clothing?: A  Little Help from another person to put on and taking off regular lower body clothing?: A Lot 6 Click Score: 14    End of Session Equipment Utilized During Treatment: Oxygen(via trach collar)  OT Visit Diagnosis: Muscle weakness (generalized) (M62.81);Unsteadiness on feet (R26.81);Other symptoms and signs involving cognitive function   Activity Tolerance Patient tolerated treatment well   Patient Left in chair;with call bell/phone within reach   Nurse Communication Mobility status        Time: 9622-2979 OT Time Calculation (min): 25 min  Charges: OT General Charges $OT Visit: 1 Visit OT Treatments $Self Care/Home Management : 8-22 mins  [name], [place] Pager [phone] Office [phone]    [name] 11/16/2018, 2:00 PM

## 2018-11-16 NOTE — Progress Notes (Signed)
  Progress Note    11/16/2018 9:36 AM 1 Day Post-Op  Subjective: No overnight issues  Vitals:   11/16/18 0824 11/16/18 0828  BP: (!) 97/43 (!) 97/43  Pulse: (!) 54   Resp: (!) 25   Temp: 98.2 F (36.8 C)   SpO2: 98%     Physical Exam: Awake and alert answering questions appropriately Nonlabored respirations with trach in place Left arm incision clean dry intact with Dermabond Palpable thrill left upper arm There is no palpable left radial pulse but the hand is well-perfused and sensorimotor intact  CBC    Component Value Date/Time   WBC 7.7 11/14/2018 1253   RBC 2.88 (L) 11/14/2018 1253   HGB 8.5 (L) 11/14/2018 1253   HGB 11.5 06/17/2018 1401   HCT 29.4 (L) 11/14/2018 1253   HCT 37.8 06/17/2018 1401   PLT 108 (L) 11/14/2018 1253   PLT 135 (L) 06/17/2018 1401   MCV 102.1 (H) 11/14/2018 1253   MCV 89 06/17/2018 1401   MCH 29.5 11/14/2018 1253   MCHC 28.9 (L) 11/14/2018 1253   RDW 26.7 (H) 11/14/2018 1253   RDW 15.3 06/17/2018 1401   LYMPHSABS 1.7 10/20/2018 0417   LYMPHSABS 1.7 01/22/2015 1217   MONOABS 2.2 (H) 10/20/2018 0417   EOSABS 0.5 10/20/2018 0417   EOSABS 0.2 01/22/2015 1217   BASOSABS 0.0 10/20/2018 0417   BASOSABS 0.0 01/22/2015 1217    BMET    Component Value Date/Time   NA 128 (L) 11/16/2018 0330   NA 143 09/13/2018 1203   K 5.4 (H) 11/16/2018 0330   CL 93 (L) 11/16/2018 0330   CO2 23 11/16/2018 0330   GLUCOSE 248 (H) 11/16/2018 0330   BUN 40 (H) 11/16/2018 0330   BUN 75 (H) 09/13/2018 1203   CREATININE 3.91 (H) 11/16/2018 0330   CREATININE 1.60 (H) 04/06/2016 1424   CALCIUM 8.9 11/16/2018 0330   GFRNONAA 11 (L) 11/16/2018 0330   GFRNONAA 65 06/07/2014 1027   GFRAA 13 (L) 11/16/2018 0330   GFRAA 75 06/07/2014 1027    INR    Component Value Date/Time   INR 1.2 10/13/2018 0259     Intake/Output Summary (Last 24 hours) at 11/16/2018 0936 Last data filed at 11/16/2018 0825 Gross per 24 hour  Intake 750 ml  Output -  Net 750 ml      Assessment/plan:  68 y.o. female is s/p left brachiocephalic AV fistula creation.  Appears to be healing well.  We will continues to use tunneled dialysis catheter.  Follow-up in 6 weeks for dialysis duplex.  Patient may need superficial sedation versus transposition of the cephalic vein given the depth of the cephalic vein in her upper arm.   Marleny Faller C. Donzetta Matters, MD [facility] [place] Office: [phone] Pager: [phone]  11/16/2018 9:36 AM

## 2018-11-16 NOTE — Progress Notes (Signed)
Physical Therapy Treatment Patient Details Name: Kathleen [name] MRN: [id number] DOB: Dec 17, 1950 Today's Date: 11/16/2018    History of Present Illness Kathleen [name] is a 68 y.o. female with history of permanent AF, hypertension, super morbid obesity, DM2, dyslipidemia, obesity hypoventilation syndrome (on home O2 with no OSA by prior PSG but showed nocturnal hypoxemia), CKD stage IV (baseline creatinine about 1.9), chronic diastolic CHF.  Admit for fluid overload. Also with pulmonary HTN.  PEA arrest 5/30, transfer to the ICU, extubater 6/3.  Intubated 10/06/18.  Trach on 10/14/18.      PT Comments    Pt admitted with above diagnosis. Pt currently with functional limitations due to the deficits listed below (see PT Problem List). Pt was able to ambulate 3 steps with RW with mod assist of 2 and cues.  Pt fearful of falling and self limiting. Needs max encouragment.  VSS.   Pt will benefit from skilled PT to increase their independence and safety with mobility to allow discharge to the venue listed below.     Follow Up Recommendations  LTACH;Supervision/Assistance - 24 hour(SNF possibly if pt is decannulated 7/21)     Equipment Recommendations  Other (comment)(TBA)    Recommendations for Other Services       Precautions / Restrictions Precautions Precautions: Fall Precaution Comments: pt on trach collar  Restrictions Weight Bearing Restrictions: No    Mobility  Bed Mobility Overal bed mobility: Needs Assistance Bed Mobility: Sidelying to Sit;Rolling Rolling: Supervision Sidelying to sit: Min assist Supine to sit: Mod assist Sit to supine: +2 for physical assistance;Mod assist   General bed mobility comments: pt able to complete supine to sidelying with cueing only, transition to sitting with min assist for trunk support  Transfers Overall transfer level: Needs assistance Equipment used: Rolling walker (2 wheeled) Transfers: Sit to/from [facility] Sit to  Stand: Max assist;+2 physical assistance;+2 safety/equipment;Mod assist Stand pivot transfers: Mod assist;+2 physical assistance       General transfer comment: sit to stand from EOB with max cueing for forward scoot, hand placement and technique; pt with poor carryover of hand placement; poor initiation of steps during pivot requiring multimodal cueing with posterior lean and flexed posture as well and pt does not folllow commands consistently. Pt pivoted to recliner from bed with need for assist to support left LE for pt comfort and pt sitting on edge of the recliner.  Talked pt into ambulating a few steps after a rest break.   Ambulation/Gait Ambulation/Gait assistance: Min assist;Mod assist;+2 physical assistance Gait Distance (Feet): 3 Feet Assistive device: Rolling walker (2 wheeled) Gait Pattern/deviations: Step-to pattern;Decreased step length - right;Decreased step length - left;Decreased stride length;Trunk flexed;Wide base of support;Leaning posteriorly;Shuffle   Gait velocity interpretation: <1.31 ft/sec, indicative of household ambulator General Gait Details: Pt was able to take 3 steps forward with each LE with max encouragment and assist.  Pt with flexed posture needing cues to take each step and assist to move RW.  Feel that pt is self limiting due to being fearful of falling. Brought chair behind pt for comfort.    Stairs             Wheelchair Mobility    Modified Rankin (Stroke Patients Only)       Balance Overall balance assessment: Needs assistance Sitting-balance support: Feet unsupported;No upper extremity supported Sitting balance-Leahy Scale: Fair Sitting balance - Comments: close supervision statically  Postural control: Posterior lean Standing balance support: Bilateral upper extremity supported;During functional activity  Standing balance-Leahy Scale: Poor Standing balance comment: reliant on BUE and external support                             Cognition Arousal/Alertness: Awake/alert Behavior During Therapy: Anxious Overall Cognitive Status: Difficult to assess                                 General Comments: used PMSV to commuincatie, able to mouth words without--fatigues easily       Exercises General Exercises - Lower Extremity Long Arc Quad: AROM;Both;10 reps;Seated Heel Slides: AROM;Both;Supine;5 reps    General Comments General comments (skin integrity, edema, etc.): 60% FiO2 at rest,  trach collar setup only goes up to 55% FiO2 therefore placed pt on 55% for transfer/mobility.       Pertinent Vitals/Pain Pain Assessment: Faces Faces Pain Scale: Hurts even more Pain Location: left LE Pain Descriptors / Indicators: Grimacing;Guarding;Sore Pain Intervention(s): Limited activity within patient's tolerance;Monitored during session;Repositioned    Home Living                      Prior Function            PT Goals (current goals can now be found in the care plan section) Acute Rehab PT Goals Patient Stated Goal: to go home Progress towards PT goals: Progressing toward goals    Frequency    Min 3X/week      PT Plan Current plan remains appropriate    Co-evaluation PT/OT/SLP Co-Evaluation/Treatment: Yes Reason for Co-Treatment: Complexity of the patient's impairments (multi-system involvement) PT goals addressed during session: Mobility/safety with mobility        AM-PAC PT "6 Clicks" Mobility   Outcome Measure  Help needed turning from your back to your side while in a flat bed without using bedrails?: A Lot Help needed moving from lying on your back to sitting on the side of a flat bed without using bedrails?: A Lot Help needed moving to and from a bed to a chair (including a wheelchair)?: A Lot Help needed standing up from a chair using your arms (e.g., wheelchair or bedside chair)?: A Lot Help needed to walk in hospital room?: A Lot Help needed climbing 3-5  steps with a railing? : Total 6 Click Score: 11    End of Session Equipment Utilized During Treatment: Oxygen;Gait belt(trach collar) Activity Tolerance: Patient limited by fatigue Patient left: with call bell/phone within reach;in chair;with chair alarm set Nurse Communication: Mobility status PT Visit Diagnosis: Other abnormalities of gait and mobility (R26.89);Muscle weakness (generalized) (M62.81)     Time: 1561-5379 PT Time Calculation (min) (ACUTE ONLY): 26 min  Charges:  $Therapeutic Activity: 8-22 mins                     [place] Pager:  [phone]  Office:  [place] 11/16/2018, 1:08 PM

## 2018-11-16 NOTE — Progress Notes (Signed)
Admit: 09/22/2018 LOS: 51  9F with prolonged dialysis dependent AKI from ATN, prolonged/chronic RF s/p Trach, AoC dCHF, debility, s/p PEA arrest  Subjective:  . No interval events . On TC/PM this AM   07/21 0701 - 07/22 0700 In: 350 [P.O.:200; I.V.:150] Out: -   Filed Weights   11/14/18 1210 11/14/18 1619 11/15/18 0535  Weight: 104.3 kg 103.4 kg 103.3 kg    Scheduled Meds: . aspirin  81 mg Oral Daily  . atorvastatin  40 mg Oral q1800  . chlorhexidine gluconate (MEDLINE KIT)  15 mL Mouth Rinse BID  . Chlorhexidine Gluconate Cloth  6 each Topical Daily  . darbepoetin (ARANESP) injection - NON-DIALYSIS  200 mcg Subcutaneous Q Wed-1800  . famotidine  20 mg Oral Daily  . feeding supplement (PRO-STAT SUGAR FREE 64)  30 mL Oral BID  . Gerhardt's butt cream   Topical BID  . guaiFENesin  15 mL Oral Q6H  . insulin aspart  0-15 Units Subcutaneous TID WC  . insulin aspart  0-5 Units Subcutaneous QHS  . insulin detemir  15 Units Subcutaneous QHS  . midodrine  10 mg Oral TID WC  . multivitamin  1 tablet Oral QHS  . mupirocin ointment  1 application Nasal BID  . sodium chloride flush  10-40 mL Intracatheter Q12H  . vitamin C  500 mg Oral BID  . zinc sulfate  220 mg Oral Daily   Continuous Infusions: . sodium chloride 250 mL (10/31/18 2358)   PRN Meds:.sodium chloride, acetaminophen (TYLENOL) oral liquid 160 mg/5 mL, ALPRAZolam, bisacodyl, docusate, Gerhardt's butt cream, ondansetron, polyethylene glycol, sodium chloride flush  Current Labs: reviewed    Physical Exam:  Blood pressure (!) 97/43, pulse (!) 54, temperature 98.2 F (36.8 C), temperature source Axillary, resp. rate (!) 25, height 5' (1.524 m), weight 103.3 kg, SpO2 98 %. Chronically ill-appearing, on trach collar with Passy-Muir valve RRR, normal S1 and S2 CTA B Left BC AV fistula with bruit and thrill, right IJ tunneled dialysis catheter without erythema or purulence No edema Hirsute EOMI  A 1. Dialysis  dependent AKI, using right IJ TDC, status post left BC AV fistula on 11/15/2018; on MWF schedule no evidence for recovery 2. Chronic respiratory failure with tracheostomy, pulmonary following, using PM valve 3. Previous PEA arrest, stabl 4. Anemia, last T sat 12% on 6/17, on Aranesp 200 mcg weekly 5. CKD-BMD, last PTH 33 10/12/2018; phosphorus and calcium stable 6. Status post cefepime for Proteus pneumonia, stopped 7/8 7. Diastolic heart failure  P . HD today on schedule: 2K, TDC, 4h, 1.5L UF, No heparin . Medication Issues; o Preferred narcotic agents for pain control are hydromorphone, fentanyl, and methadone. Morphine should not be used.  o Baclofen should be avoided o Avoid oral sodium phosphate and magnesium citrate based laxatives / bowel preps    Pearson Grippe MD 11/16/2018, 11:59 AM  Recent Labs  Lab 11/12/18 0447 11/13/18 0511 11/14/18 0415 11/15/18 0431 11/16/18 0330  NA 132* 133* 129* 133* 128*  K 4.2 4.3 4.9 4.3 5.4*  CL 97* 97* 93* 98 93*  CO2 _0 GLUCOSE 166* 195* 128* 139* 248*  BUN 37* 27* 42* 21 40*  CREATININE 3.56* 2.87* 3.95* 2.50* 3.91*  CALCIUM 8.5* 8.6* 9.1 8.7* 8.9  PHOS 2.8 2.5 3.0  --   --    Recent Labs  Lab 11/11/18 0413 11/12/18 0447 11/14/18 1253  WBC 4.6 5.5 7.7  HGB 8.1* 8.4* 8.5*  HCT 28.2*  28.8* 29.4*  MCV 97.2 98.6 102.1*  PLT 145* 137* 108*

## 2018-11-17 DIAGNOSIS — J9501 Hemorrhage from tracheostomy stoma: Secondary | ICD-10-CM | POA: Diagnosis not present

## 2018-11-17 DIAGNOSIS — I5043 Acute on chronic combined systolic (congestive) and diastolic (congestive) heart failure: Secondary | ICD-10-CM | POA: Diagnosis present

## 2018-11-17 DIAGNOSIS — R590 Localized enlarged lymph nodes: Secondary | ICD-10-CM | POA: Diagnosis not present

## 2018-11-17 DIAGNOSIS — F05 Delirium due to known physiological condition: Secondary | ICD-10-CM | POA: Diagnosis not present

## 2018-11-17 DIAGNOSIS — G9349 Other encephalopathy: Secondary | ICD-10-CM | POA: Diagnosis not present

## 2018-11-17 DIAGNOSIS — I5041 Acute combined systolic (congestive) and diastolic (congestive) heart failure: Secondary | ICD-10-CM | POA: Diagnosis not present

## 2018-11-17 DIAGNOSIS — I132 Hypertensive heart and chronic kidney disease with heart failure and with stage 5 chronic kidney disease, or end stage renal disease: Secondary | ICD-10-CM | POA: Diagnosis not present

## 2018-11-17 DIAGNOSIS — Z9981 Dependence on supplemental oxygen: Secondary | ICD-10-CM | POA: Diagnosis not present

## 2018-11-17 DIAGNOSIS — E118 Type 2 diabetes mellitus with unspecified complications: Secondary | ICD-10-CM | POA: Diagnosis not present

## 2018-11-17 DIAGNOSIS — E440 Moderate protein-calorie malnutrition: Secondary | ICD-10-CM | POA: Diagnosis not present

## 2018-11-17 DIAGNOSIS — F0631 Mood disorder due to known physiological condition with depressive features: Secondary | ICD-10-CM | POA: Diagnosis not present

## 2018-11-17 DIAGNOSIS — I999 Unspecified disorder of circulatory system: Secondary | ICD-10-CM | POA: Diagnosis not present

## 2018-11-17 DIAGNOSIS — F419 Anxiety disorder, unspecified: Secondary | ICD-10-CM | POA: Diagnosis not present

## 2018-11-17 DIAGNOSIS — R140 Abdominal distension (gaseous): Secondary | ICD-10-CM | POA: Diagnosis not present

## 2018-11-17 DIAGNOSIS — I442 Atrioventricular block, complete: Secondary | ICD-10-CM | POA: Diagnosis not present

## 2018-11-17 DIAGNOSIS — J15211 Pneumonia due to Methicillin susceptible Staphylococcus aureus: Secondary | ICD-10-CM | POA: Diagnosis not present

## 2018-11-17 DIAGNOSIS — Z452 Encounter for adjustment and management of vascular access device: Secondary | ICD-10-CM | POA: Diagnosis not present

## 2018-11-17 DIAGNOSIS — Z794 Long term (current) use of insulin: Secondary | ICD-10-CM | POA: Diagnosis not present

## 2018-11-17 DIAGNOSIS — J9610 Chronic respiratory failure, unspecified whether with hypoxia or hypercapnia: Secondary | ICD-10-CM | POA: Diagnosis not present

## 2018-11-17 DIAGNOSIS — N189 Chronic kidney disease, unspecified: Secondary | ICD-10-CM | POA: Diagnosis not present

## 2018-11-17 DIAGNOSIS — E1142 Type 2 diabetes mellitus with diabetic polyneuropathy: Secondary | ICD-10-CM | POA: Diagnosis not present

## 2018-11-17 DIAGNOSIS — K219 Gastro-esophageal reflux disease without esophagitis: Secondary | ICD-10-CM | POA: Diagnosis not present

## 2018-11-17 DIAGNOSIS — I959 Hypotension, unspecified: Secondary | ICD-10-CM | POA: Diagnosis not present

## 2018-11-17 DIAGNOSIS — J9621 Acute and chronic respiratory failure with hypoxia: Secondary | ICD-10-CM | POA: Diagnosis not present

## 2018-11-17 DIAGNOSIS — N170 Acute kidney failure with tubular necrosis: Secondary | ICD-10-CM | POA: Diagnosis not present

## 2018-11-17 DIAGNOSIS — T82598A Other mechanical complication of other cardiac and vascular devices and implants, initial encounter: Secondary | ICD-10-CM | POA: Diagnosis not present

## 2018-11-17 DIAGNOSIS — R262 Difficulty in walking, not elsewhere classified: Secondary | ICD-10-CM | POA: Diagnosis not present

## 2018-11-17 DIAGNOSIS — N186 End stage renal disease: Secondary | ICD-10-CM | POA: Diagnosis not present

## 2018-11-17 DIAGNOSIS — J90 Pleural effusion, not elsewhere classified: Secondary | ICD-10-CM | POA: Diagnosis not present

## 2018-11-17 DIAGNOSIS — Z09 Encounter for follow-up examination after completed treatment for conditions other than malignant neoplasm: Secondary | ICD-10-CM | POA: Diagnosis not present

## 2018-11-17 DIAGNOSIS — J156 Pneumonia due to other aerobic Gram-negative bacteria: Secondary | ICD-10-CM | POA: Diagnosis not present

## 2018-11-17 DIAGNOSIS — J95851 Ventilator associated pneumonia: Secondary | ICD-10-CM | POA: Diagnosis not present

## 2018-11-17 DIAGNOSIS — K9422 Gastrostomy infection: Secondary | ICD-10-CM | POA: Diagnosis not present

## 2018-11-17 DIAGNOSIS — F321 Major depressive disorder, single episode, moderate: Secondary | ICD-10-CM | POA: Diagnosis not present

## 2018-11-17 DIAGNOSIS — J9601 Acute respiratory failure with hypoxia: Secondary | ICD-10-CM | POA: Diagnosis not present

## 2018-11-17 DIAGNOSIS — R001 Bradycardia, unspecified: Secondary | ICD-10-CM | POA: Diagnosis not present

## 2018-11-17 DIAGNOSIS — J1520 Pneumonia due to staphylococcus, unspecified: Secondary | ICD-10-CM | POA: Diagnosis not present

## 2018-11-17 DIAGNOSIS — Z430 Encounter for attention to tracheostomy: Secondary | ICD-10-CM | POA: Diagnosis not present

## 2018-11-17 DIAGNOSIS — Z1159 Encounter for screening for other viral diseases: Secondary | ICD-10-CM | POA: Diagnosis not present

## 2018-11-17 DIAGNOSIS — K929 Disease of digestive system, unspecified: Secondary | ICD-10-CM | POA: Diagnosis not present

## 2018-11-17 DIAGNOSIS — M545 Low back pain: Secondary | ICD-10-CM | POA: Diagnosis not present

## 2018-11-17 DIAGNOSIS — N2581 Secondary hyperparathyroidism of renal origin: Secondary | ICD-10-CM | POA: Diagnosis not present

## 2018-11-17 DIAGNOSIS — I4892 Unspecified atrial flutter: Secondary | ICD-10-CM | POA: Diagnosis not present

## 2018-11-17 DIAGNOSIS — J181 Lobar pneumonia, unspecified organism: Secondary | ICD-10-CM | POA: Diagnosis not present

## 2018-11-17 DIAGNOSIS — I130 Hypertensive heart and chronic kidney disease with heart failure and stage 1 through stage 4 chronic kidney disease, or unspecified chronic kidney disease: Secondary | ICD-10-CM | POA: Diagnosis not present

## 2018-11-17 DIAGNOSIS — Z992 Dependence on renal dialysis: Secondary | ICD-10-CM | POA: Diagnosis not present

## 2018-11-17 DIAGNOSIS — J9612 Chronic respiratory failure with hypercapnia: Secondary | ICD-10-CM | POA: Diagnosis not present

## 2018-11-17 DIAGNOSIS — I4821 Permanent atrial fibrillation: Secondary | ICD-10-CM | POA: Diagnosis not present

## 2018-11-17 DIAGNOSIS — J9600 Acute respiratory failure, unspecified whether with hypoxia or hypercapnia: Secondary | ICD-10-CM | POA: Diagnosis not present

## 2018-11-17 DIAGNOSIS — J189 Pneumonia, unspecified organism: Secondary | ICD-10-CM | POA: Diagnosis not present

## 2018-11-17 DIAGNOSIS — E8770 Fluid overload, unspecified: Secondary | ICD-10-CM | POA: Diagnosis not present

## 2018-11-17 DIAGNOSIS — R1311 Dysphagia, oral phase: Secondary | ICD-10-CM | POA: Diagnosis not present

## 2018-11-17 DIAGNOSIS — Y848 Other medical procedures as the cause of abnormal reaction of the patient, or of later complication, without mention of misadventure at the time of the procedure: Secondary | ICD-10-CM | POA: Diagnosis not present

## 2018-11-17 DIAGNOSIS — R5381 Other malaise: Secondary | ICD-10-CM | POA: Diagnosis not present

## 2018-11-17 DIAGNOSIS — E662 Morbid (severe) obesity with alveolar hypoventilation: Secondary | ICD-10-CM | POA: Diagnosis not present

## 2018-11-17 DIAGNOSIS — R030 Elevated blood-pressure reading, without diagnosis of hypertension: Secondary | ICD-10-CM | POA: Diagnosis not present

## 2018-11-17 DIAGNOSIS — E785 Hyperlipidemia, unspecified: Secondary | ICD-10-CM | POA: Diagnosis not present

## 2018-11-17 DIAGNOSIS — J159 Unspecified bacterial pneumonia: Secondary | ICD-10-CM | POA: Diagnosis not present

## 2018-11-17 DIAGNOSIS — E872 Acidosis: Secondary | ICD-10-CM | POA: Diagnosis not present

## 2018-11-17 DIAGNOSIS — M6250 Muscle wasting and atrophy, not elsewhere classified, unspecified site: Secondary | ICD-10-CM | POA: Diagnosis not present

## 2018-11-17 DIAGNOSIS — Z930 Tracheostomy status: Secondary | ICD-10-CM | POA: Diagnosis not present

## 2018-11-17 DIAGNOSIS — J9620 Acute and chronic respiratory failure, unspecified whether with hypoxia or hypercapnia: Secondary | ICD-10-CM | POA: Diagnosis not present

## 2018-11-17 DIAGNOSIS — G4733 Obstructive sleep apnea (adult) (pediatric): Secondary | ICD-10-CM | POA: Diagnosis not present

## 2018-11-17 DIAGNOSIS — E669 Obesity, unspecified: Secondary | ICD-10-CM | POA: Diagnosis not present

## 2018-11-17 DIAGNOSIS — I272 Pulmonary hypertension, unspecified: Secondary | ICD-10-CM | POA: Diagnosis not present

## 2018-11-17 DIAGNOSIS — L89153 Pressure ulcer of sacral region, stage 3: Secondary | ICD-10-CM | POA: Diagnosis not present

## 2018-11-17 DIAGNOSIS — R1310 Dysphagia, unspecified: Secondary | ICD-10-CM | POA: Diagnosis not present

## 2018-11-17 DIAGNOSIS — E871 Hypo-osmolality and hyponatremia: Secondary | ICD-10-CM | POA: Diagnosis not present

## 2018-11-17 DIAGNOSIS — M6281 Muscle weakness (generalized): Secondary | ICD-10-CM | POA: Diagnosis not present

## 2018-11-17 DIAGNOSIS — I509 Heart failure, unspecified: Secondary | ICD-10-CM | POA: Diagnosis not present

## 2018-11-17 DIAGNOSIS — E46 Unspecified protein-calorie malnutrition: Secondary | ICD-10-CM | POA: Diagnosis not present

## 2018-11-17 DIAGNOSIS — M2550 Pain in unspecified joint: Secondary | ICD-10-CM | POA: Diagnosis not present

## 2018-11-17 DIAGNOSIS — R062 Wheezing: Secondary | ICD-10-CM | POA: Diagnosis not present

## 2018-11-17 DIAGNOSIS — I472 Ventricular tachycardia: Secondary | ICD-10-CM | POA: Diagnosis not present

## 2018-11-17 DIAGNOSIS — Z9989 Dependence on other enabling machines and devices: Secondary | ICD-10-CM | POA: Diagnosis not present

## 2018-11-17 DIAGNOSIS — I517 Cardiomegaly: Secondary | ICD-10-CM | POA: Diagnosis not present

## 2018-11-17 DIAGNOSIS — I5032 Chronic diastolic (congestive) heart failure: Secondary | ICD-10-CM | POA: Diagnosis not present

## 2018-11-17 DIAGNOSIS — Z9911 Dependence on respirator [ventilator] status: Secondary | ICD-10-CM | POA: Diagnosis not present

## 2018-11-17 DIAGNOSIS — I4891 Unspecified atrial fibrillation: Secondary | ICD-10-CM | POA: Diagnosis not present

## 2018-11-17 DIAGNOSIS — T8249XA Other complication of vascular dialysis catheter, initial encounter: Secondary | ICD-10-CM | POA: Diagnosis not present

## 2018-11-17 DIAGNOSIS — K9423 Gastrostomy malfunction: Secondary | ICD-10-CM | POA: Diagnosis not present

## 2018-11-17 DIAGNOSIS — I10 Essential (primary) hypertension: Secondary | ICD-10-CM | POA: Diagnosis not present

## 2018-11-17 DIAGNOSIS — E1122 Type 2 diabetes mellitus with diabetic chronic kidney disease: Secondary | ICD-10-CM | POA: Diagnosis not present

## 2018-11-17 DIAGNOSIS — J810 Acute pulmonary edema: Secondary | ICD-10-CM | POA: Diagnosis not present

## 2018-11-17 DIAGNOSIS — I82621 Acute embolism and thrombosis of deep veins of right upper extremity: Secondary | ICD-10-CM | POA: Diagnosis not present

## 2018-11-17 DIAGNOSIS — E1140 Type 2 diabetes mellitus with diabetic neuropathy, unspecified: Secondary | ICD-10-CM | POA: Diagnosis not present

## 2018-11-17 DIAGNOSIS — N179 Acute kidney failure, unspecified: Secondary | ICD-10-CM | POA: Diagnosis not present

## 2018-11-17 DIAGNOSIS — R579 Shock, unspecified: Secondary | ICD-10-CM | POA: Diagnosis not present

## 2018-11-17 DIAGNOSIS — N183 Chronic kidney disease, stage 3 (moderate): Secondary | ICD-10-CM | POA: Diagnosis not present

## 2018-11-17 DIAGNOSIS — J9622 Acute and chronic respiratory failure with hypercapnia: Secondary | ICD-10-CM | POA: Diagnosis not present

## 2018-11-17 DIAGNOSIS — R918 Other nonspecific abnormal finding of lung field: Secondary | ICD-10-CM | POA: Diagnosis not present

## 2018-11-17 DIAGNOSIS — Z7401 Bed confinement status: Secondary | ICD-10-CM | POA: Diagnosis not present

## 2018-11-17 DIAGNOSIS — R109 Unspecified abdominal pain: Secondary | ICD-10-CM | POA: Diagnosis not present

## 2018-11-17 DIAGNOSIS — E1169 Type 2 diabetes mellitus with other specified complication: Secondary | ICD-10-CM | POA: Diagnosis not present

## 2018-11-17 DIAGNOSIS — Y95 Nosocomial condition: Secondary | ICD-10-CM | POA: Diagnosis not present

## 2018-11-17 DIAGNOSIS — E119 Type 2 diabetes mellitus without complications: Secondary | ICD-10-CM | POA: Diagnosis not present

## 2018-11-17 DIAGNOSIS — I482 Chronic atrial fibrillation, unspecified: Secondary | ICD-10-CM | POA: Diagnosis not present

## 2018-11-17 DIAGNOSIS — Z4659 Encounter for fitting and adjustment of other gastrointestinal appliance and device: Secondary | ICD-10-CM | POA: Diagnosis not present

## 2018-11-17 DIAGNOSIS — I120 Hypertensive chronic kidney disease with stage 5 chronic kidney disease or end stage renal disease: Secondary | ICD-10-CM | POA: Diagnosis not present

## 2018-11-17 DIAGNOSIS — N19 Unspecified kidney failure: Secondary | ICD-10-CM | POA: Diagnosis not present

## 2018-11-17 DIAGNOSIS — Z6841 Body Mass Index (BMI) 40.0 and over, adult: Secondary | ICD-10-CM | POA: Diagnosis not present

## 2018-11-17 DIAGNOSIS — E1165 Type 2 diabetes mellitus with hyperglycemia: Secondary | ICD-10-CM | POA: Diagnosis not present

## 2018-11-17 DIAGNOSIS — J9690 Respiratory failure, unspecified, unspecified whether with hypoxia or hypercapnia: Secondary | ICD-10-CM | POA: Diagnosis not present

## 2018-11-17 DIAGNOSIS — F339 Major depressive disorder, recurrent, unspecified: Secondary | ICD-10-CM | POA: Diagnosis not present

## 2018-11-17 DIAGNOSIS — N184 Chronic kidney disease, stage 4 (severe): Secondary | ICD-10-CM | POA: Diagnosis not present

## 2018-11-17 DIAGNOSIS — J811 Chronic pulmonary edema: Secondary | ICD-10-CM | POA: Diagnosis not present

## 2018-11-17 DIAGNOSIS — R570 Cardiogenic shock: Secondary | ICD-10-CM | POA: Diagnosis not present

## 2018-11-17 DIAGNOSIS — G894 Chronic pain syndrome: Secondary | ICD-10-CM | POA: Diagnosis not present

## 2018-11-17 DIAGNOSIS — I5033 Acute on chronic diastolic (congestive) heart failure: Secondary | ICD-10-CM | POA: Diagnosis not present

## 2018-11-17 LAB — MAGNESIUM
Magnesium: 2 mg/dL (ref 1.7–2.4)
Magnesium: 2 mg/dL (ref 1.7–2.4)

## 2018-11-17 LAB — COMPREHENSIVE METABOLIC PANEL WITH GFR
ALT: 9 U/L (ref 0–44)
AST: 17 U/L (ref 15–41)
Albumin: 2.7 g/dL — ABNORMAL LOW (ref 3.5–5.0)
Alkaline Phosphatase: 103 U/L (ref 38–126)
Anion gap: 13 (ref 5–15)
BUN: 19 mg/dL (ref 8–23)
CO2: 26 mmol/L (ref 22–32)
Calcium: 8.7 mg/dL — ABNORMAL LOW (ref 8.9–10.3)
Chloride: 95 mmol/L — ABNORMAL LOW (ref 98–111)
Creatinine, Ser: 2.34 mg/dL — ABNORMAL HIGH (ref 0.44–1.00)
GFR calc Af Amer: 24 mL/min — ABNORMAL LOW
GFR calc non Af Amer: 21 mL/min — ABNORMAL LOW
Glucose, Bld: 144 mg/dL — ABNORMAL HIGH (ref 70–99)
Potassium: 3.9 mmol/L (ref 3.5–5.1)
Sodium: 134 mmol/L — ABNORMAL LOW (ref 135–145)
Total Bilirubin: 0.8 mg/dL (ref 0.3–1.2)
Total Protein: 6.3 g/dL — ABNORMAL LOW (ref 6.5–8.1)

## 2018-11-17 LAB — COMPREHENSIVE METABOLIC PANEL
ALT: 8 U/L (ref 0–44)
AST: 17 U/L (ref 15–41)
Albumin: 2.6 g/dL — ABNORMAL LOW (ref 3.5–5.0)
Alkaline Phosphatase: 112 U/L (ref 38–126)
Anion gap: 11 (ref 5–15)
BUN: 27 mg/dL — ABNORMAL HIGH (ref 8–23)
CO2: 25 mmol/L (ref 22–32)
Calcium: 8.4 mg/dL — ABNORMAL LOW (ref 8.9–10.3)
Chloride: 96 mmol/L — ABNORMAL LOW (ref 98–111)
Creatinine, Ser: 2.88 mg/dL — ABNORMAL HIGH (ref 0.44–1.00)
GFR calc Af Amer: 19 mL/min — ABNORMAL LOW (ref >60)
GFR calc non Af Amer: 16 mL/min — ABNORMAL LOW (ref >60)
Glucose, Bld: 236 mg/dL — ABNORMAL HIGH (ref 70–99)
Potassium: 3.9 mmol/L (ref 3.5–5.1)
Sodium: 132 mmol/L — ABNORMAL LOW (ref 135–145)
Total Bilirubin: 0.8 mg/dL (ref 0.3–1.2)
Total Protein: 6.3 g/dL — ABNORMAL LOW (ref 6.5–8.1)

## 2018-11-17 LAB — CBC WITH DIFFERENTIAL/PLATELET
Abs Immature Granulocytes: 0.07 10*3/uL (ref 0.00–0.07)
Basophils Absolute: 0 10*3/uL (ref 0.0–0.1)
Basophils Relative: 1 %
Eosinophils Absolute: 0.1 10*3/uL (ref 0.0–0.5)
Eosinophils Relative: 1 %
HCT: 27.8 % — ABNORMAL LOW (ref 36.0–46.0)
Hemoglobin: 8.2 g/dL — ABNORMAL LOW (ref 12.0–15.0)
Immature Granulocytes: 1 %
Lymphocytes Relative: 29 %
Lymphs Abs: 1.9 10*3/uL (ref 0.7–4.0)
MCH: 30.4 pg (ref 26.0–34.0)
MCHC: 29.5 g/dL — ABNORMAL LOW (ref 30.0–36.0)
MCV: 103 fL — ABNORMAL HIGH (ref 80.0–100.0)
Monocytes Absolute: 0.8 10*3/uL (ref 0.1–1.0)
Monocytes Relative: 12 %
Neutro Abs: 3.7 10*3/uL (ref 1.7–7.7)
Neutrophils Relative %: 56 %
Platelets: 97 10*3/uL — ABNORMAL LOW (ref 150–400)
RBC: 2.7 MIL/uL — ABNORMAL LOW (ref 3.87–5.11)
RDW: 27.4 % — ABNORMAL HIGH (ref 11.5–15.5)
WBC: 6.5 10*3/uL (ref 4.0–10.5)
nRBC: 0.8 % — ABNORMAL HIGH (ref 0.0–0.2)

## 2018-11-17 LAB — CBC
HCT: 29.1 % — ABNORMAL LOW (ref 36.0–46.0)
Hemoglobin: 8.3 g/dL — ABNORMAL LOW (ref 12.0–15.0)
MCH: 29.5 pg (ref 26.0–34.0)
MCHC: 28.5 g/dL — ABNORMAL LOW (ref 30.0–36.0)
MCV: 103.6 fL — ABNORMAL HIGH (ref 80.0–100.0)
Platelets: 101 10*3/uL — ABNORMAL LOW (ref 150–400)
RBC: 2.81 MIL/uL — ABNORMAL LOW (ref 3.87–5.11)
RDW: 27.1 % — ABNORMAL HIGH (ref 11.5–15.5)
WBC: 7.1 10*3/uL (ref 4.0–10.5)
nRBC: 1.4 % — ABNORMAL HIGH (ref 0.0–0.2)

## 2018-11-17 LAB — GLUCOSE, CAPILLARY
Glucose-Capillary: 127 mg/dL — ABNORMAL HIGH (ref 70–99)
Glucose-Capillary: 248 mg/dL — ABNORMAL HIGH (ref 70–99)

## 2018-11-17 MED ORDER — MIDODRINE HCL 10 MG PO TABS: 10.0000 mg | ORAL_TABLET | Freq: Three times a day (TID) | ORAL | 0 refills | Status: AC

## 2018-11-17 MED ORDER — INSULIN DETEMIR 100 UNIT/ML ~~LOC~~ SOLN: 15.0000 [IU] | Freq: Every day | SUBCUTANEOUS | 5 refills | Status: AC

## 2018-11-17 MED ORDER — ASPIRIN 81 MG PO CHEW: 81.0000 mg | CHEWABLE_TABLET | Freq: Every day | ORAL | 0 refills | Status: AC

## 2018-11-17 NOTE — TOC Transition Note (Signed)
Transition of Care [facility]) - CM/SW Discharge Note   Patient Details  Name: Kathleen [name] MRN: [id number] Date of Birth: 01-24-1951  Transition of Care [facility]) CM/SW Contact:  Alexander Mt, [place] Phone Number: 11/17/2018, 2:15 PM   Clinical Narrative:    CSW has completed paperwork, sent discharge summary to [facility] representative Lenord Fellers at [phone]. Confirmed COVID test previously done was sufficient with Raquel Sarna. Arranged [facility] for 3pm, they may be after. Bedside RN aware. EMTALA will need to be completed prior to discharge (RN also aware of this).   Pt family contacted they are aware and amenable with plan for transfer today.   No controlled substances noted on summary and pt is Full Code per Epic review.  Final next level of care: Long Term Acute Care (LTAC) Barriers to Discharge: Barriers Resolved   Patient Goals and CMS Choice Patient states their goals for this hospitalization and ongoing recovery are:: "to finally get home" CMS Medicare.gov Compare Post Acute Care list provided to:: Patient Choice offered to / list presented to : Patient  Discharge Placement  Patient chooses bed at: [facility](LTAC) Patient to be transferred to facility by: [place] Name of family member notified: [name]  at [phone] Patient and family notified of of transfer: 11/17/18  Discharge Plan and Services In-house Referral: Hospice / Palliative Care Discharge Planning Services: CM Consult Post Acute Care Choice: Long Term Acute Care (LTAC)          DME Arranged: N/A DME Agency: NA       HH Arranged: NA HH Agency: NA        Social Determinants of Health (SDOH) Interventions     Readmission Risk Interventions Readmission Risk Prevention Plan 11/04/2018 10/18/2018  Transportation Screening - Not Complete  Transportation Screening Comment LTACH pending insurance authorization Continued medical work-up; CIR pending  Medication Review Designer, fashion/clothing) - Complete  PCP or Specialist appointment within 3-5 days of discharge - Not Complete  PCP/Specialist Appt Not Complete comments LTACH pending insurance authorization Continued medical work-up; CIR pending  HRI or [place] - Complete  SW Recovery Care/Counseling Consult - Complete  Palliative Care Screening Complete Not [place] - Not Applicable  Some recent data might be hidden

## 2018-11-17 NOTE — Progress Notes (Signed)
NAME:  GLORY GRAEFE, MRN:  [id number], DOB:  May 02, 1950, LOS: 26 ADMISSION DATE:  09/22/2018, CONSULTATION DATE: 09/24/2018 REFERRING MD: Candee Furbish MD, CHIEF COMPLAINT: Cardiac arrest  Brief History   68 y/o obese woman with chronic diastolic heart failure who failed outpatient diuretics management due to chronic kidney disease.  Right heart cath on 5/29 showed markedly elevated biventricular filling pressures with normal cardiac output.  She was on cardiology service, maintained on milrinone and Lasix.  On the morning of 5/30 she went into PEA arrest, CPR performed for 8 minutes.  Intubated and transferred to ICU.  Started on CRRT 5/31.  Extubated 6/3.  Re-intubated early am 6/11 for hypercarbic respiratory failure and underwent tracheostomy on 6/19.  Past Medical History   has a past medical history of Atrial flutter ([place]), Bradycardia, Chronic diastolic (congestive) heart failure ([place]) (01/10/2015), CKD (chronic kidney disease), stage IV ([place]), Degenerative joint disease of hand, Diabetes mellitus, Dyslipidemia, Fecal occult blood test positive, GERD (gastroesophageal reflux disease), Headache, Hypertension, Inadequate material resources, Irritable bowel syndrome, Morbid obesity ([place]), Obesity hypoventilation syndrome ([place]), Post-menopausal bleeding, and Shortness of breath dyspnea.  Significant Hospital Events   5/28 Admit 5/29 RHC 5/30 PEA arrest, intubated/ CRRT 5/31 Milrinone stopped due to ectopy; brady episode- amio stopped 6/02 Attempt at SBT, chest x-ray with worsening pulmonary edema, CVVHD for volume removal 6/03 Extubated > remain on CRRT.  Refused nocturnal BiPAP 6/04 Refused nocturnal BiPAP 6/10 off CRRT 6/11 Intubated early am with hypercarbia, concern for RUL PNA; abx broadened; restarted on CRRT 6/16  No events overnight, weaning on high PS 6/17  failed vent wean, remains on levophed, midodrine and CRRT 6/18  Weaned for 5 hours on PSV 8/5 before requiring full support;  ongoing CRRT, remains on levophed 14 mcg 6/19 - TRACH + Dr Erskine Emery 6/20 -. On IV heparin gt, levophed gtt with midodrine. Off fent gtt. Pressopr needs down 6/26 - TCT tolerated for the past 24 hours  6/27 - Stable on TCT now >48hrs, tolerated first session of iHD  6/30 hypotension / bradycardia overnight, started on dopamine 7/2 Placed on ATC at 40% fiO2 7/6 Palliative care consult/ iHD 7/7 remains dependent on dopamine at 7, for bradycardia/ hypotension.   Palliative consult noted - family would like to visit 7/9 for goals of care meeting. 7/08 3.5 L off w/ iHD, weaned well all day, dopamine off late evening 7/9 great day, family meeting w/son and PMT, remains full code per patient 7/11 to ATC 7/13 remains on ATC 7/16 Trach change to 4 cuffless 7/17 Remains on 40% ATC 7/18 did tolerate swallowing yesterday  Consults:  Cardiology PCCM  Nephrology  EP  Procedures:  Lt PICC 5/29 >> 6/30 OETT 5/30 >> 6/3; 6/11 > 6/19 - TRACH (Dr Erskine Emery) >> R IJ HD cath 5/30 >> Aline left ulnar 5/30 >> 6/2 R nare cortrak 6/5 >> 7/13  Significant Diagnostic Tests:  TTE 5/28 >>The left ventricle has normal systolic function with an ejection fraction of 60-65%. . The right ventricle has normal systolic function. The cavity was mildly enlarged.   Right heart cath 5/29 RA = 24 RV = 94/26 PA = 95/36 (53) PCW = 30 (v = 50) Fick cardiac output/index = 6.0/2.7 PVR =3.9 WU FA sat = 91% PA sat = 58%, 58% SVC 60%  Micro Data:  SARS coronavirus 2 cepheid 5/28 >> neg MRSA PCR 5/28 >> neg Trach aspirate 6/2 >> MSSA BCx2 6/2 >> negative ...................... Tracheal aspirate 6/11 >>  few GNR >> few proteus and few candida tropicalis BCx2 6/11 >> negative 10/27/2018 tracheal aspirate>> gram-positive cocci gram-negative rods>>  Antimicrobials:  Vancomycin 6/3 > 6/4 Cefazolin 6/4 >> 6/10 ...........................Marland Kitchen Vanco 6/11 >> 6/17 Cefepime 6/11 >>6/18 Cefepime 10/27/2018>> 7/6  Interim  history/subjective:  Reportedly had an episode of bradycardia to 40s with some associated lightheadedness overnight.  She was saturating 100% on her standard trach collar, was not tachypneic at the time.  EKG concerning for possible third-degree AV block. She is been tolerating PMV without difficulty, sleeping with her trach uncapped on 0.40 ATC.  Tolerating modified diet.  She requires intermittent suctioning, clear some of her secretions completely but not all  Objective   Blood pressure (!) 98/25, pulse (!) 52, temperature 98.1 F (36.7 C), temperature source Oral, resp. rate (!) 21, height 5' (1.524 m), weight 105.9 kg, SpO2 97 %.    FiO2 (%):  [40 %] 40 %   Intake/Output Summary (Last 24 hours) at 11/17/2018 0947 Last data filed at 11/16/2018 2312 Gross per 24 hour  Intake 300 ml  Output 507 ml  Net -207 ml   Filed Weights   11/16/18 1400 11/16/18 1810 11/17/18 0443  Weight: 106.3 kg 105.8 kg 105.9 kg   General: Obese, ill-appearing woman, no distress Neuro: Completely awake, appropriate, follows commands, answers all questions HEENT: Cuffless #4 trach in place with a PMV.  Strong voice with good phonation.  Strong cough but she does not clear all of her secretions all the way up to her mouth. Lungs: Coarse bilateral breath sounds, no wheezing Heart: Regular, bradycardic, no murmur, distant Abdomen: Soft, nontender, positive bowel sounds Skin: No rash    Assessment & Plan:   Respiratory failure status post tracheostomy #4 cuffless trach in place.  Tolerating ATC ad lib.  PMV during the day Continue ATC and wean FiO2 as able, goal to 0.28 at which time could consider progressive capping, goal decannulation Continue PMV during the day, modified diet Suctioning as needed, clearing some of her secretions but not all Trach care as per protocol  Dialysis dependent renal failure - Continue per nephrology, HD per renal  Proteus colonization versus bronchitis Treated with  cefepime Secretion clearance, suctioning as needed, no evidence for active infection currently  Persistent shock after PEA arrest, resolved Bradycardia, question AV block Atrial fibrillation/history of atrial flutter Acute on chronic diastolic heart failure Was deemed a poor candidate for EP procedures previously, await further input  Nutrition Dysphagia:  Continue modified diet, liberalize as able depending on SLP evaluation  Goals of care. Full code  Best practice:  Diet: Continue tube feeds Pain/Anxiety/Delirium protocol (if indicated): NA VAP protocol (if indicated): In place  DVT prophylaxis: On heparin GI prophylaxis: Pepcid Glucose control: SSI Mobility: PT/ OT as tolerated Code Status: Full Disposition: Planning for transition to [place] for further care, oxygen weaning, possible transition to capping trials with an ultimate goal for decannulation.  No barriers to transfer from a respiratory standpoint.  No new recommendations   Baltazar Apo, MD, PhD 11/17/2018, 9:54 AM [facility] [phone] or if no answer [phone]

## 2018-11-17 NOTE — Progress Notes (Signed)
  Speech Language Pathology Treatment: Dysphagia  Patient Details Name: Kathleen [name] MRN: [id number] DOB: 1950/11/13 Today's Date: 11/17/2018 Time: 4128-7867 SLP Time Calculation (min) (ACUTE ONLY): 9 min  Assessment / Plan / Recommendation Clinical Impression  Pt had her PMV in place upon SLP arrival, receiving medications administered by RN. SLP provided assist with repositioning pt's HOB to be in a more elevated position for safer intake. Pt had occasional delayed cough noted, and she self-reports coughing with meals. RN elaborates that coughing tends to be after the meal more so than during, and with additional questioning, pt shares a h/o GER and coughing after meals at baseline. Given this and her adequate airway protection on FEES, would continue current diet but would follow aspiration and esophageal precautions closely. Would continue to use PMV for all PO intake, which she wore well throughout this session. Will continue to follow acutely.    HPI HPI: 68 y.o. female admitted 09/22/2018 for surgery. PMH: permanent atrial flutter with baseline bradycardia, hypertension, super morbid obesity, IIDDM, dyslipidemia, obesity hypoventilation syndrome, CKD stage IV, chronic diastolic CHF, chronic edema. PEA 09/24/2018. Intubated 5/30-09/28/2018, again 6/11until trach 6/19. SLP was seeing pt in between intubations with concern for post-extubation dysphagia and instrumental testing recommended, although this was not completed as pt had been reintubated.      SLP Plan  Continue with current plan of care       Recommendations  Diet recommendations: Dysphagia 2 (fine chop);Thin liquid Liquids provided via: Cup;Straw Medication Administration: Whole meds with puree Supervision: Staff to assist with self feeding;Full supervision/cueing for compensatory strategies Compensations: Slow rate;Small sips/bites;Minimize environmental distractions Postural Changes and/or Swallow Maneuvers: Seated upright 90  degrees;Upright 30-60 min after meal                Oral Care Recommendations: Oral care BID Follow up Recommendations: LTACH SLP Visit Diagnosis: Dysphagia, unspecified (R13.10) Plan: Continue with current plan of care       GO                Kathleen [name] Kathleen [name] 11/17/2018, 11:11 AM  Kathleen [name], M.A. [place] Acute Environmental education officer [phone] Office [phone]

## 2018-11-17 NOTE — Progress Notes (Signed)
Report given to RN at [facility].  Patient ready for transport.

## 2018-11-17 NOTE — Progress Notes (Signed)
Admit: 09/22/2018 LOS: 74  47F with prolonged dialysis dependent AKI from ATN, prolonged/chronic RF s/p Trach, AoC dCHF, debility, s/p PEA arrest  Subjective:  . No interval events . HD yesterday: 0.5L UF, no issues . Likley to [facility] today   07/22 0701 - 07/23 0700 In: 850 [P.O.:850] Out: 507   Filed Weights   11/16/18 1400 11/16/18 1810 11/17/18 0443  Weight: 106.3 kg 105.8 kg 105.9 kg    Scheduled Meds: . aspirin  81 mg Oral Daily  . atorvastatin  40 mg Oral q1800  . chlorhexidine gluconate (MEDLINE KIT)  15 mL Mouth Rinse BID  . Chlorhexidine Gluconate Cloth  6 each Topical Daily  . darbepoetin (ARANESP) injection - NON-DIALYSIS  200 mcg Subcutaneous Q Wed-1800  . famotidine  20 mg Oral Daily  . feeding supplement (PRO-STAT SUGAR FREE 64)  30 mL Oral BID  . Gerhardt's butt cream   Topical BID  . guaiFENesin  15 mL Oral Q6H  . insulin aspart  0-15 Units Subcutaneous TID WC  . insulin aspart  0-5 Units Subcutaneous QHS  . insulin detemir  15 Units Subcutaneous QHS  . midodrine  10 mg Oral TID WC  . multivitamin  1 tablet Oral QHS  . mupirocin ointment  1 application Nasal BID  . sodium chloride flush  10-40 mL Intracatheter Q12H  . vitamin C  500 mg Oral BID  . zinc sulfate  220 mg Oral Daily   Continuous Infusions: . sodium chloride 250 mL (10/31/18 2358)  . sodium chloride    . sodium chloride     PRN Meds:.sodium chloride, sodium chloride, sodium chloride, acetaminophen (TYLENOL) oral liquid 160 mg/5 mL, ALPRAZolam, alteplase, bisacodyl, docusate, Gerhardt's butt cream, heparin, lidocaine (PF), lidocaine-prilocaine, ondansetron, pentafluoroprop-tetrafluoroeth, polyethylene glycol, sodium chloride flush  Current Labs: reviewed    Physical Exam:  Blood pressure (!) 98/25, pulse (!) 52, temperature 98.1 F (36.7 C), temperature source Oral, resp. rate (!) 21, height 5' (1.524 m), weight 105.9 kg, SpO2 97 %. Chronically ill-appearing, on trach collar with Passy-Muir  valve RRR, normal S1 and S2 CTA B Left BC AV fistula with bruit and thrill, right IJ tunneled dialysis catheter without erythema or purulence No edema Hirsute EOMI  A 1. Dialysis dependent AKI, using right IJ TDC, status post left BC AV fistula on 11/15/2018; on MWF schedule no evidence for recovery 2. Chronic respiratory failure with tracheostomy, pulmonary following, using PM valve 3. Previous PEA arrest, stabl 4. Anemia, last T sat 12% on 6/17, on Aranesp 200 mcg weekly 5. CKD-BMD, last PTH 33 10/12/2018; phosphorus and calcium stable 6. Status post cefepime for Proteus pneumonia, stopped 7/8 7. Diastolic heart failure  P . HD on MWF on schedule: 2K, TDC, 4h, 1.5L UF, No heparin . OK with DC to LTACH, we will cont renal care there . Medication Issues; o Preferred narcotic agents for pain control are hydromorphone, fentanyl, and methadone. Morphine should not be used.  o Baclofen should be avoided o Avoid oral sodium phosphate and magnesium citrate based laxatives / bowel preps    Pearson Grippe MD 11/17/2018, 11:45 AM  Recent Labs  Lab 11/12/18 0447 11/13/18 0511 11/14/18 0415 11/15/18 0431 11/16/18 0330 11/17/18 0421  NA 132* 133* 129* 133* 128* 134*  K 4.2 4.3 4.9 4.3 5.4* 3.9  CL 97* 97* 93* 98 93* 95*  CO2 25 26 24 27 23 26   GLUCOSE 166* 195* 128* 139* 248* 144*  BUN 37* 27* 42* 21 40* 19  CREATININE 3.56* 2.87* 3.95* 2.50* 3.91* 2.34*  CALCIUM 8.5* 8.6* 9.1 8.7* 8.9 8.7*  PHOS 2.8 2.5 3.0  --   --   --    Recent Labs  Lab 11/14/18 1253 11/16/18 1500 11/17/18 0421  WBC 7.7 9.1 6.5  NEUTROABS  --   --  3.7  HGB 8.5* 8.4* 8.2*  HCT 29.4* 29.9* 27.8*  MCV 102.1* 102.7* 103.0*  PLT 108* 116* 97*

## 2018-11-17 NOTE — TOC Progression Note (Signed)
Transition of Care [facility]) - Progression Note    Patient Details  Name: CURTISTINE PETTITT MRN: [id number] Date of Birth: [birth year]  Transition of Care [facility]) CM/SW [place], [place] Phone Number: 11/17/2018, 10:48 AM  Clinical Narrative:    Aware pt stable for discharge, pt will transport by [facility] to [place]. Await dc summary prior to arranging transport.    Expected Discharge Plan: Long Term Acute Care (LTAC) Barriers to Discharge: Continued Medical Work up, Orthoptist and Services Expected Discharge Plan: Long Term Acute Care (LTAC) In-house Referral: Hospice / Palliative Care Discharge Planning Services: CM Consult Post Acute Care Choice: Long Term Acute Care (LTAC) Living arrangements for the past 2 months: Single Family Home                 DME Arranged: N/A DME Agency: NA       HH Arranged: NA HH Agency: NA  Social Determinants of Health (SDOH) Interventions    Readmission Risk Interventions Readmission Risk Prevention Plan 11/04/2018 10/18/2018  Transportation Screening - Not Complete  Transportation Screening Comment LTACH pending insurance authorization Continued medical work-up; CIR pending  Medication Review [employer]) - Complete  PCP or Specialist appointment within 3-5 days of discharge - Not Complete  PCP/Specialist Appt Not Complete comments LTACH pending insurance authorization Continued medical work-up; CIR pending  HRI or [place] - Complete  SW Recovery Care/Counseling Consult - Complete  Palliative Care Screening Complete Not [place] - Not Applicable  Some recent data might be hidden

## 2018-11-17 NOTE — Significant Event (Signed)
Rapid Response Event Note  Overview:  Called by bedside RN d/t pt bradycardic and hypotensive c/o being "dizzy". RN states provider notified, EKG done and 253ml bolus ordered. Instructed to place zoll pads on pt if in 3rd deg block (bedside RN questioning rhythm) and symptomatic.   Initial Focused Assessment: On arrival, pt sitting in bed with HR 48, BP 87/29, RR 19, spO2 100% on TC. Pt states she feels dizzy and lightheaded. EKG showing junctional, rhythm with questionable 3rd deg. Pt appears to have been in this rate/rhythm for awhile. She received HD today which could be contributing to low BP.   Interventions: EKG, provider notified, 229ml bolus (all prior to my arrival) Zoll pads in place, rhythm strip reviewed  Plan of Care (if not transferred): Continue to monitor pt status. RN instructed to call provider if BP does not improve after bolus. She may need some meds to help with pressure control. Bedside RN and charge RN informed and instructed to call with any changes or concerns.   Event Summary:  called at  2310    event ended at  [place]

## 2018-11-17 NOTE — Progress Notes (Signed)
Patient ID: Kathleen [name], female   DOB: 06-14-1950, 68 y.o.   MRN: [id number]  EP Attending  Patient seen and examined. Asked by Dr. Johnsie Cancel to make a recommendation regarding the patient's CHB. I saw her when she came in last month. She has a tracheostomy in place. She is pending transfer to SNF. Review of her tele demonstrates NSR with CHB and a narrow regular escape rate of 40-60/min. Exam demonstrates an obese woman with tracheostomy in place. Vitals are stable.  A/P 1. CHB - because of her tracheostomy, other comorbid conditions, and well tolerated CHB with a narrow escape, the risks/benefits of PPM insertion are outweighted by certain infectious complications. If she is able to have her tracheostomy reversed in the future and/or she became more symptomatic, the PPM insertion could be reconsidered.   Rec: allow patient to be transferred to SNF. Avoid all AV nodal blocking agents.   Mikle Bosworth.D.

## 2018-11-17 NOTE — Social Work (Signed)
Clinical Social Worker facilitated patient discharge including contacting patient family and facility to confirm patient discharge plans.  Clinical information faxed to facility and family agreeable with plan.  CSW will arrange ambulance transport via [facility] to [place] once summary is complete.  Accepting MD - Almedia Balls, MD Report # [phone] Room # -  [facility] Rm 318  Clinical Social Worker will sign off for now as social work intervention is no longer needed. Please consult [place] again if new need arises.  Westley Hummer, MSW, [place] Social Worker [phone]

## 2018-11-17 NOTE — Discharge Summary (Signed)
Physician Discharge Summary  Kathleen [name] GYJ:[id number] DOB: December 29, 1950 DOA: 09/22/2018  PCP: Aldine Contes, MD  Admit date: 09/22/2018 Discharge date: 11/17/2018  [facility]  Recommendations for Outpatient Follow-up:  1. Follow up with PCP in 1-2 weeks 2. Please obtain BMP/CBC in one week 3. Please follow up on the following pending results:  Home Health: None Equipment/Devices: None  Discharge Condition: Fair/stable CODE STATUS: Full code Diet recommendation: Cardiac/renal  Subjective: Patient seen and examined.  She has no complaints.  She is able to communicate very well.  Denies any shortness of breath or chest pain.  Brief/Interim Summary: 68 year old female presented to the outpatient cardiology clinic for follow-up. She does have significant past medical history for permanent atrial fibrillation, hypertension, morbid obesity, type 2 diabetes mellitus, dyslipidemia, obesity hypoventilation, chronic kidney disease stage V, anddiastolic heart failure. She reported orthopnea, increased dyspnea on exertion, and increased lower extremity edema. Patient failed for outpatient diuretic management, and she was directly admitted to the hospital for further evaluation.  She had a prolonged hospitalization. Patient was initially placed on milrinone and furosemide. May 30 she suffered from a cardiac arrest, pulseless electrical activity. She recovered spontaneous circulation after 8 minutes of CPR. She was intubated and supported with mechanical ventilation.  She had worsening kidney function required CRRT May 31. She was extubated June 3, and reintubated June 11 for hypercapnic respiratory failure. She remainedventilator dependent respiratory failureand on June 19 underwent tracheostomy.  She required vasopressors and inotropes for hemodynamic decompensation.  She was transferred to [facility] on July 12.  11/10/18 Trach changed to cuffless  #4,off dopamine.  11/11/18 Transferred to progressive care unit.  Patient has passed her swallow evaluation, trach changed to #4 cuffless, diet has been advanced to dysphagia diet 2.   Now status permanent access for HD as well as left brachiocephalic AV fistula which will be used later.  Patient became much more stable on 11/14/2020 and we were waiting for placement.  Patient continues to be on 40% FiO2.  PCCM recommends that providers at [facility] try to wean her down to 28% and eventually decannulation.  Of note, during this hospitalization, patient had atrial fibrillation with slow ventricular response for which cardiology was consulted however she was deemed to be not a candidate for any EP study or pacemaker so they had signed off.  Patient has remained symptom-free for any cardiac issues however has remained bradycardic but yesterday her heart rate dropped to 40 and EKG was done which showed complete heart block but patient did not have any symptoms of any chest pain or shortness of breath or any dizziness.  Cardiology/EP was reconsulted today for their opinion on this.  Once again, according to their note, she is not a candidate for any EP study or any pacemaker at this point in time but this should be considered once patient is decannulated in the future.  She will need to follow with cardiology for that.  She remains on intermittent dialysis by nephrology 3 times a week.  She was on diuretics prior to coming to the hospital which are being discontinued per nephrology recommendations.  She will continue her anticoagulation for her atrial fibrillation.  Currently she is stable for discharge.  She has been cleared by cardiology, nephrology as well as PCCM.  Please make a note that patient has had approximately 2 months of hospitalization which was very complex and I have seen patient only yesterday and today.  I have tried my best to  document discharge summary to reflect most of the events  however please refer to below timeline for major events.  5/28 Admit 5/29 RHC 5/30 PEA arrest, intubated/ CRRT 5/31 Milrinone stopped due to ectopy; brady episode- amio stopped 6/02 Attempt at SBT, chest x-ray with worsening pulmonary edema, CVVHD for volume removal 6/03 Extubated > remain on CRRT.  Refused nocturnal BiPAP 6/04 Refused nocturnal BiPAP 6/10 off CRRT 6/11 Intubated early am with hypercarbia, concern for RUL PNA; abx broadened; restarted on CRRT 6/16  No events overnight, weaning on high PS 6/17  failed vent wean, remains on levophed, midodrine and CRRT 6/18  Weaned for 5 hours on PSV 8/5 before requiring full support; ongoing CRRT, remains on levophed 14 mcg 6/19 - TRACH + Dr Erskine Emery 6/20 -. On IV heparin gt, levophed gtt with midodrine. Off fent gtt. Pressopr needs down 6/26 - TCT tolerated for the past 24 hours  6/27 - Stable on TCT now >48hrs, tolerated first session of iHD  6/30 hypotension / bradycardia overnight, started on dopamine 7/2 Placed on ATC at 40% fiO2 7/6 Palliative care consult/ iHD 7/7 remains dependent on dopamine at 7, for bradycardia/ hypotension.   Palliative consult noted - family would like to visit 7/9 for goals of care meeting. 7/08 3.5 L off w/ iHD, weaned well all day, dopamine off late evening 7/9 great day, family meeting w/son and PMT, remains full code per patient 7/11 to ATC 7/13 remains on ATC 7/16 Trach change to 4 cuffless 7/17 Remains on 40% ATC 7/18 did tolerate swallowing yesterday  Discharge Diagnoses:  Active Problems:   Type 2 diabetes mellitus with other specified complication (HCC)   Dyslipidemia   OBESITY   Acute on chronic diastolic CHF (congestive heart failure) (HCC)   Acute kidney injury superimposed on chronic kidney disease (HCC)   Acute respiratory failure with hypoxia (HCC)   VAP (ventilator-associated pneumonia) (HCC)   Shock ([place])   Acute respiratory failure (HCC)   CHF (congestive heart failure) ([place])    Palliative care encounter   Dysphagia   Tracheostomy status [facility])    Discharge Instructions  Discharge Instructions    Discharge patient   Complete by: As directed    Discharge disposition: 04-Intermediate Care Facility   Discharge patient date: 11/17/2018     Allergies as of 11/17/2018      Reactions   Doxycycline    REACTION: Wheals and pruritus      Medication List    STOP taking these medications   metolazone 5 MG tablet Commonly known as: ZAROXOLYN   torsemide 20 MG tablet Commonly known as: DEMADEX     TAKE these medications   Accu-Chek Aviva Plus test strip Generic drug: glucose blood TEST BLOOD SUGAR THREE TIMES DAILY What changed: See the new instructions.   Accu-Chek Aviva Plus w/Device Kit TEST BLOOD SUGAR THREE TIMES DAILY   Accu-Chek FastClix Lancets Misc TEST BLOOD SUGAR THREE TIMES DAILY What changed: Another medication with the same name was changed. Make sure you understand how and when to take each.   Accu-Chek Softclix Lancets lancets TEST BLOOD SUGAR THREE TIMES DAILY What changed: See the new instructions.   apixaban 5 MG Tabs tablet Commonly known as: Eliquis Take 1 tablet (5 mg total) by mouth 2 (two) times daily.   aspirin 81 MG chewable tablet Chew 1 tablet (81 mg total) by mouth daily. Start taking on: November 18, 2018   atorvastatin 40 MG tablet Commonly known as: LIPITOR Take 1 tablet (  40 mg total) by mouth daily.   gabapentin 300 MG capsule Commonly known as: NEURONTIN TAKE 1 CAPSULE EVERY MORNING, TAKE 1 CAPSULE IN THE AFTERNOON, AND TAKE 2 CAPSULES EVERY NIGHT What changed:   how much to take  how to take this  additional instructions   HEALTHY EYES PO Take 1 tablet by mouth daily. Reported on 05/17/2015   insulin detemir 100 UNIT/ML injection Commonly known as: Levemir Inject 0.15 mLs (15 Units total) into the skin at bedtime. Inject 65 units total into the skin once daily. What changed:   how much to  take  how to take this  when to take this   Insulin Pen Needle 31G X 5 MM Misc Commonly known as: B-D UF III MINI PEN NEEDLES USE ONE TIME DAILY TO INJECT VICTOZA. DX:E11.9   Insulin Syringe-Needle U-100 31G X 15/64" 1 ML Misc Commonly known as: BD Veo Insulin Syringe U/F USE TO INJECT INSULIN ONE TIME A DAY   liraglutide 18 MG/3ML Sopn Commonly known as: Victoza Inject 0.3 mLs (1.8 mg total) into the skin daily.   metFORMIN 500 MG tablet Commonly known as: GLUCOPHAGE Take 1 tablet (500 mg total) by mouth 2 (two) times daily with a meal.   midodrine 10 MG tablet Commonly known as: PROAMATINE Take 1 tablet (10 mg total) by mouth 3 (three) times daily with meals.   NON FORMULARY Place 2 L into the nose daily. Wears with exertion and qhs   omeprazole 20 MG capsule Commonly known as: PRILOSEC Take 20 mg by mouth daily.   POTASSIUM CHLORIDE CRYS ER PO Take 20 mEq by mouth daily. For 10 days      Follow-up Information    [facility] In 6 weeks.   Specialty: Vascular Surgery Why: Office will call you to arrange your appt (sent) Contact information: [place] [phone]       Aldine Contes, MD Follow up in 1 week(s).   Specialty: Internal Medicine Contact information: [address] [place] [address] [phone]        Sueanne Margarita, MD .   Specialty: Cardiology Contact information: [address] [place] [address] [phone]          Allergies  Allergen Reactions  . Doxycycline     REACTION: Wheals and pruritus    Consultations: Nephrology/cardiology/PCCM  Procedures/Studies: Ct Abdomen Wo Contrast  Result Date: 11/07/2018 CLINICAL DATA:  Evaluate gastric anatomy prior to potential percutaneous gastrostomy tube placement. EXAM: CT ABDOMEN WITHOUT CONTRAST TECHNIQUE: Multidetector CT imaging of the abdomen was performed following the  standard protocol without IV contrast. COMPARISON:  Abdominal radiograph-11/06/2018; chest radiograph-10/29/2018; 10/21/2018 FINDINGS: Lower chest: Limited visualization of the lower thorax demonstrates extensive consolidative opacities within the imaged right lower lobe with associated air bronchograms. Mild interstitial thickening within the imaged lung bases as could be seen in the setting pulmonary edema. Trace right-sided pleural effusion. Cardiomegaly. Coronary artery calcifications. There is mild diffuse decreased attenuation intra cardiac blood pool suggestive of anemia. No pericardial effusion. Hepatobiliary: Mild nodularity hepatic contour. Post cholecystectomy. No ascites. Pancreas: Pancreas appears atrophic. Spleen: Normal appearance of the spleen. Note is made of a small splenule about the splenic hilum. Adrenals/Urinary Tract: Calcifications noted about the bilateral renal hila, favored to be vascular in etiology. No definite renal stones. There is a minimal amount of likely age and body habitus related bilateral perinephric stranding. No urinary obstruction. Note is made  of an approximately 1.6 x 1.3 cm nodule within the medial limb of the left adrenal gland (image 26, series 3 which is incompletely characterize though statistically most likely to represent a benign adrenal adenoma. Normal noncontrast appearance of the right adrenal gland. The urinary bladder was not imaged. Stomach/Bowel: The transverse colon and hepatic parenchyma are interposed between the ventral wall of the upper abdomen and anterior wall of the stomach, precluding percutaneous gastrostomy tube access. Moderate colonic stool burden without evidence of enteric obstruction. No pneumoperitoneum, pneumatosis or portal venous gas. Vascular/Lymphatic: Atherosclerotic plaque within a normal caliber abdominal aorta. No bulky retroperitoneal or mesenteric adenopathy on this noncontrast examination Other: Diffuse body wall anasarca.  Ill-defined subcutaneous stranding about the midline of the chest (image 1, series 3), may represent the sequela of previous chest compressions during cardiac resuscitation. Musculoskeletal: No acute or aggressive osseous abnormalities. Stigmata of DISH in the lower thoracic spine. Bilateral facet degenerative change. IMPRESSION: 1. Gastric anatomy NOT amenable to percutaneous gastrostomy tube placement secondary to interposition of the transverse colon and left lobe of the liver. If gastrostomy tube placement is still desired, surgical consultation is advised. 2. Consolidative opacities within the right lower lobe with associated air bronchograms and trace right-sided pleural effusion, potentially atelectasis, though could be seen in the setting of infection and/or aspiration. Clinical correlation is advised. 3. Cardiomegaly. Coronary artery calcifications. Aortic Atherosclerosis (ICD10-I70.0). Electronically Signed   By: Sandi Mariscal M.D.   On: 11/07/2018 16:41   Dg Chest 1 View  Result Date: 11/12/2018 CLINICAL DATA:  Dyspnea. EXAM: CHEST  1 VIEW COMPARISON:  Radiographs of November 10, 2018. FINDINGS: Stable cardiomegaly with central pulmonary vascular congestion. Tracheostomy and feeding tubes are unchanged in position. Right internal jugular dialysis catheter is noted which is unchanged. Mild bilateral perihilar and basilar opacities are noted concerning for pulmonary edema. No significant pleural effusion is noted. Bony thorax is unremarkable. IMPRESSION: Stable cardiomegaly with central pulmonary vascular congestion and bilateral pulmonary edema. Stable support apparatus. Electronically Signed   By: Marijo Conception M.D.   On: 11/12/2018 14:37   Dg Abd 1 View  Result Date: 11/09/2018 CLINICAL DATA:  NG placement EXAM: ABDOMEN - 1 VIEW COMPARISON:  11/06/2018 FINDINGS: NG tube in the stomach with the tip near the pylorus. Stomach decompressed. Small bowel decompressed. Mildly distended colon unchanged.  IMPRESSION: NG tube in the stomach.  Nonobstructive bowel gas pattern unchanged. Electronically Signed   By: Franchot Gallo M.D.   On: 11/09/2018 12:09   Dg Abd 1 View  Result Date: 11/06/2018 CLINICAL DATA:  Nasogastric tube placement EXAM: ABDOMEN - 1 VIEW COMPARISON:  Chest radiograph 11/06/2018 at 10:05 a.m. FINDINGS: The nasogastric tube tip projects over the stomach. Position is unchanged. The visualized bowel gas pattern and right lung basilar opacities are unchanged. IMPRESSION: Unchanged position of nasogastric tube with tip and side port projecting over the stomach. Electronically Signed   By: Ulyses Jarred M.D.   On: 11/06/2018 17:59   Dg Abd 1 View  Result Date: 11/06/2018 CLINICAL DATA:  Nasogastric tube placement EXAM: ABDOMEN - 1 VIEW COMPARISON:  September 30, 2018 FINDINGS: Nasogastric tube tip and side port are in the in stomach. The visualized bowel gas pattern is unremarkable without evidence of bowel obstruction or free air. There are surgical clips in right upper abdomen. There is cardiomegaly. There is airspace consolidation in the medial right base. Tracheostomy catheter tip is 4.6 cm above the carina. Central catheter tip is in the superior vena cava.  IMPRESSION: Nasogastric tube tip and port in stomach. Visualized bowel gas pattern unremarkable. Stable cardiomegaly. Airspace consolidation consistent with pneumonia medial right base. Electronically Signed   By: Lowella Grip III M.D.   On: 11/06/2018 10:35   Dg Chest Port 1 View  Result Date: 11/10/2018 CLINICAL DATA:  Evaluate NG tube placement EXAM: PORTABLE CHEST 1 VIEW COMPARISON:  October 29, 2018 FINDINGS: The NG tube terminates below today's film in the right abdomen. A tracheostomy tube remains. The right central line remains. The right lung opacity is less focal in the interval. Probable mild edema, improved. Cardiomediastinal silhouette is stable. No other acute abnormalities. IMPRESSION: 1. The NG tube terminates below  today's film to the right of midline. 2. Mild edema. 3. Improving more focal opacity in the right lung. Recommend follow-up to complete resolution. Electronically Signed   By: Dorise Bullion III M.D   On: 11/10/2018 15:32   Dg Chest Port 1 View  Result Date: 10/29/2018 CLINICAL DATA:  PICC placement. EXAM: PORTABLE CHEST 1 VIEW COMPARISON:  Radiograph of October 24, 2018. FINDINGS: Stable cardiomegaly. Tracheostomy and feeding tube are unchanged in position. Right internal jugular dialysis catheter is unchanged in position. Interval placement of right-sided PICC line is noted with distal tip in expected position of the SVC. Mildly improved right lung opacity is noted concerning for edema or pneumonia. Stable left perihilar and basilar opacity is noted concerning for edema or possibly pneumonia. No pneumothorax is noted. No significant pleural effusion is noted. Bony thorax is unremarkable. IMPRESSION: Interval placement of right-sided PICC line with distal tip in expected position of the SVC. Otherwise stable support apparatus. Slightly improved right lung opacity is noted concerning for edema or pneumonia. Stable left lung opacity as described above. Electronically Signed   By: Marijo Conception M.D.   On: 10/29/2018 16:07   Dg Chest Port 1 View  Result Date: 10/24/2018 CLINICAL DATA:  Tracheostomy. EXAM: PORTABLE CHEST 1 VIEW COMPARISON:  10/21/2018. FINDINGS: Tracheostomy tube, feeding tube, right IJ line, left PICC line in stable position. Cardiomegaly unchanged. Diffuse bilateral pulmonary infiltrates/edema again noted. Low lung volumes. Similar findings noted on prior exam. No prominent pleural effusion or pneumothorax. IMPRESSION: 1.  Lines and tubes stable position. 2.  Cardiomegaly unchanged. 3. Diffuse bilateral pulmonary infiltrates/edema again noted. Low lung volumes. Similar findings noted on prior exam. Electronically Signed   By: [place]   On: 10/24/2018 07:31   Dg Chest Port 1  View  Result Date: 10/21/2018 CLINICAL DATA:  Respiratory failure. EXAM: PORTABLE CHEST 1 VIEW COMPARISON:  Radiograph of October 19, 2018. FINDINGS: Stable cardiomegaly with central pulmonary vascular congestion. Tracheostomy and feeding tubes are unchanged in position. Right internal jugular catheter is unchanged. Left subclavian catheter is unchanged in position. No pneumothorax is noted. Increased bilateral perihilar opacities are noted which may represent edema. Right upper lobe opacity is noted concerning for atelectasis or possibly infiltrate. Bony thorax is unremarkable. IMPRESSION: Stable cardiomegaly with central pulmonary vascular congestion and possible bilateral perihilar edema. Right upper lobe atelectasis or infiltrate is noted. Stable support apparatus. Electronically Signed   By: Marijo Conception M.D.   On: 10/21/2018 08:23   Dg Chest Port 1 View  Result Date: 10/19/2018 CLINICAL DATA:  Respiratory failure. EXAM: PORTABLE CHEST 1 VIEW COMPARISON:  Radiograph of October 16, 2018. FINDINGS: Stable cardiomegaly with central pulmonary vascular congestion. Stable position of tracheostomy and feeding tube. Right internal jugular catheter is unchanged in position. Left subclavian catheter is unchanged in  position. Stable probable bilateral pulmonary edema. No significant pleural effusion is noted. No pneumothorax is noted. Bony thorax is unremarkable. IMPRESSION: Stable cardiomegaly with central pulmonary vascular congestion and probable bilateral pulmonary edema. Stable support apparatus. Electronically Signed   By: Marijo Conception M.D.   On: 10/19/2018 07:27   Vas [place] Upper Ext Vein Mapping (pre-op Avf)  Result Date: 11/10/2018 UPPER EXTREMITY VEIN MAPPING  Indications: Pre-access. Performing Technologist: June Leap RDMS, RVT  Examination Guidelines: A complete evaluation includes B-mode imaging, spectral Doppler, color Doppler, and power Doppler as needed of all accessible portions of each vessel.  Bilateral testing is considered an integral part of a complete examination. Limited examinations for reoccurring indications may be performed as noted. +-----------------+-------------+----------+--------------------------------+ Right Cephalic   Diameter (cm)Depth (cm)            Findings             +-----------------+-------------+----------+--------------------------------+ Shoulder             0.38        2.04                                    +-----------------+-------------+----------+--------------------------------+ Prox upper arm       0.46        2.18                                    +-----------------+-------------+----------+--------------------------------+ Mid upper arm        0.48        1.70                                    +-----------------+-------------+----------+--------------------------------+ Dist upper arm                          not imaged due to line/ bandages +-----------------+-------------+----------+--------------------------------+ Antecubital fossa    0.51        0.57                                    +-----------------+-------------+----------+--------------------------------+ Prox forearm         0.36        1.03              branching             +-----------------+-------------+----------+--------------------------------+ Mid forearm          0.41        0.52                                    +-----------------+-------------+----------+--------------------------------+ Dist forearm         0.30        0.42                                    +-----------------+-------------+----------+--------------------------------+ +-----------------+-------------+----------+--------+ Left Cephalic    Diameter (cm)Depth (cm)Findings +-----------------+-------------+----------+--------+ Shoulder             0.42                        +-----------------+-------------+----------+--------+  Prox upper arm       0.51        2.28             +-----------------+-------------+----------+--------+ Mid upper arm        0.46        1.54            +-----------------+-------------+----------+--------+ Dist upper arm       0.57        0.97            +-----------------+-------------+----------+--------+ Antecubital fossa    0.72                        +-----------------+-------------+----------+--------+ Prox forearm         0.37        1.13            +-----------------+-------------+----------+--------+ Mid forearm          0.38        0.96            +-----------------+-------------+----------+--------+ Dist forearm         0.41        0.43            +-----------------+-------------+----------+--------+ *See table(s) above for measurements and observations.  Diagnosing physician: Harold Barban MD Electronically signed by Harold Barban MD on 11/10/2018 at 1:13:28 PM.    Final    [place] Ekg Site Rite  Result Date: 10/29/2018 If Site Rite image not attached, placement could not be confirmed due to current cardiac rhythm.    Discharge Exam: Vitals:   11/17/18 1200 11/17/18 1204  BP: (!) 87/43   Pulse: (!) 46   Resp: 18   Temp: 98.1 F (36.7 C)   SpO2: 91% 92%   Vitals:   11/17/18 0600 11/17/18 0800 11/17/18 1200 11/17/18 1204  BP: (!) 98/25 (!) 109/44 (!) 87/43   Pulse: (!) 52 (!) 50 (!) 46   Resp: (!) 21 18 18    Temp:  98.2 F (36.8 C) 98.1 F (36.7 C)   TempSrc:  Oral Oral   SpO2: 97% 98% 91% 92%  Weight:      Height:        General: Pt is alert, awake, not in acute distress, with a tracheostomy on vent Cardiovascular: Irregularly irregular rate and rhythm, bradycardia, S1/S2 +, no rubs, no gallops Respiratory: Coarse breath sounds bilaterally, no wheezing, no rhonchi Abdominal: Soft, NT, ND, bowel sounds + Extremities: +1 pitting edema bilateral lower extremity, no cyanosis    The results of significant diagnostics from this hospitalization (including imaging, microbiology, ancillary  and laboratory) are listed below for reference.     Microbiology: Recent Results (from the past 240 hour(s))  Surgical pcr screen     Status: Abnormal   Collection Time: 11/14/18  6:36 PM   Specimen: Nasal Mucosa; Nasal Swab  Result Value Ref Range Status   MRSA, PCR NEGATIVE NEGATIVE Final   Staphylococcus aureus POSITIVE (A) NEGATIVE Final    Comment: (NOTE) The Xpert SA Assay (FDA approved for NASAL specimens in patients 39 years of age and older), is one component of a comprehensive surveillance program. It is not intended to diagnose infection nor to guide or monitor treatment. Performed at [facility], [place] [address], [place], [place] [address]      Labs: BNP (last 3 results) Recent Labs    09/22/18 1420  BNP 280.0*   Basic Metabolic Panel: Recent  Labs  Lab 11/11/18 0413 11/12/18 0447 11/13/18 0511 11/14/18 0415 11/15/18 0431 11/16/18 0330 11/17/18 0421  NA 132* 132* 133* 129* 133* 128* 134*  K 3.8 4.2 4.3 4.9 4.3 5.4* 3.9  CL 96* 97* 97* 93* 98 93* 95*  CO2 26 25 26 24 27 23 26   GLUCOSE 195* 166* 195* 128* 139* 248* 144*  BUN 45* 37* 27* 42* 21 40* 19  CREATININE 4.36* 3.56* 2.87* 3.95* 2.50* 3.91* 2.34*  CALCIUM 8.6* 8.5* 8.6* 9.1 8.7* 8.9 8.7*  MG  --  2.2  --   --   --   --  2.0  PHOS 4.4 2.8 2.5 3.0  --   --   --    Liver Function Tests: Recent Labs  Lab 11/11/18 0413 11/12/18 0447 11/13/18 0511 11/14/18 0415 11/17/18 0421  AST  --   --   --   --  17  ALT  --   --   --   --  9  ALKPHOS  --   --   --   --  103  BILITOT  --   --   --   --  0.8  PROT  --   --   --   --  6.3*  ALBUMIN 2.3* 2.7* 2.8* 2.7* 2.7*   No results for input(s): LIPASE, AMYLASE in the last 168 hours. No results for input(s): AMMONIA in the last 168 hours. CBC: Recent Labs  Lab 11/11/18 0413 11/12/18 0447 11/14/18 1253 11/16/18 1500 11/17/18 0421  WBC 4.6 5.5 7.7 9.1 6.5  NEUTROABS  --   --   --   --  3.7  HGB 8.1* 8.4* 8.5* 8.4* 8.2*  HCT 28.2* 28.8*  29.4* 29.9* 27.8*  MCV 97.2 98.6 102.1* 102.7* 103.0*  PLT 145* 137* 108* 116* 97*   Cardiac Enzymes: No results for input(s): CKTOTAL, CKMB, CKMBINDEX, TROPONINI in the last 168 hours. BNP: Invalid input(s): POCBNP CBG: Recent Labs  Lab 11/16/18 1149 11/16/18 1837 11/16/18 2127 11/17/18 0611 11/17/18 1108  GLUCAP 246* 162* 187* 127* 248*   D-Dimer No results for input(s): DDIMER in the last 72 hours. Hgb A1c No results for input(s): HGBA1C in the last 72 hours. Lipid Profile No results for input(s): CHOL, HDL, LDLCALC, TRIG, CHOLHDL, LDLDIRECT in the last 72 hours. Thyroid function studies No results for input(s): TSH, T4TOTAL, T3FREE, THYROIDAB in the last 72 hours.  Invalid input(s): FREET3 Anemia work up No results for input(s): VITAMINB12, FOLATE, FERRITIN, TIBC, IRON, RETICCTPCT in the last 72 hours. Urinalysis    Component Value Date/Time   COLORURINE AMBER (A) 05/22/2013 1826   APPEARANCEUR HAZY (A) 05/22/2013 1826   LABSPEC 1.024 05/22/2013 1826   PHURINE 6.0 05/22/2013 1826   GLUCOSEU NEGATIVE 05/22/2013 1826   HGBUR NEGATIVE 05/22/2013 1826   BILIRUBINUR SMALL (A) 05/22/2013 1826   KETONESUR NEGATIVE 05/22/2013 1826   PROTEINUR 100 (A) 05/22/2013 1826   UROBILINOGEN 4.0 (H) 05/22/2013 1826   NITRITE NEGATIVE 05/22/2013 1826   LEUKOCYTESUR NEGATIVE 05/22/2013 1826   Sepsis Labs Invalid input(s): PROCALCITONIN,  WBC,  LACTICIDVEN Microbiology Recent Results (from the past 240 hour(s))  Surgical pcr screen     Status: Abnormal   Collection Time: 11/14/18  6:36 PM   Specimen: Nasal Mucosa; Nasal Swab  Result Value Ref Range Status   MRSA, PCR NEGATIVE NEGATIVE Final   Staphylococcus aureus POSITIVE (A) NEGATIVE Final    Comment: (NOTE) The Xpert SA Assay (FDA approved for NASAL specimens in  patients 22 years of age and older), is one component of a comprehensive surveillance program. It is not intended to diagnose infection nor to guide or monitor  treatment. Performed at [facility], [place] [address], [place], [place] [address]      Time coordinating discharge: Over 30 minutes  SIGNED:   Darliss Cheney, MD  [facility] 11/17/2018, 12:46 PM Pager [phone]  If 7PM-7AM, please contact night-coverage [email or web address] Password [email or web address]

## 2018-11-17 NOTE — Plan of Care (Signed)
Patient being discharged to [facility]

## 2018-11-28 DIAGNOSIS — Z9911 Dependence on respirator [ventilator] status: Secondary | ICD-10-CM

## 2018-11-28 DIAGNOSIS — J9621 Acute and chronic respiratory failure with hypoxia: Secondary | ICD-10-CM

## 2018-11-28 DIAGNOSIS — J189 Pneumonia, unspecified organism: Secondary | ICD-10-CM

## 2018-11-28 DIAGNOSIS — I469 Cardiac arrest, cause unspecified: Secondary | ICD-10-CM

## 2018-11-28 DIAGNOSIS — N186 End stage renal disease: Secondary | ICD-10-CM

## 2018-11-28 DIAGNOSIS — I5032 Chronic diastolic (congestive) heart failure: Secondary | ICD-10-CM

## 2018-11-29 DIAGNOSIS — I5032 Chronic diastolic (congestive) heart failure: Secondary | ICD-10-CM

## 2018-11-29 DIAGNOSIS — J189 Pneumonia, unspecified organism: Secondary | ICD-10-CM

## 2018-11-29 DIAGNOSIS — N186 End stage renal disease: Secondary | ICD-10-CM

## 2018-11-29 DIAGNOSIS — J9621 Acute and chronic respiratory failure with hypoxia: Secondary | ICD-10-CM

## 2018-11-30 DIAGNOSIS — J189 Pneumonia, unspecified organism: Secondary | ICD-10-CM

## 2018-11-30 DIAGNOSIS — N186 End stage renal disease: Secondary | ICD-10-CM

## 2018-11-30 DIAGNOSIS — I5032 Chronic diastolic (congestive) heart failure: Secondary | ICD-10-CM

## 2018-11-30 DIAGNOSIS — J9621 Acute and chronic respiratory failure with hypoxia: Secondary | ICD-10-CM

## 2018-12-01 DIAGNOSIS — N186 End stage renal disease: Secondary | ICD-10-CM

## 2018-12-01 DIAGNOSIS — J9621 Acute and chronic respiratory failure with hypoxia: Secondary | ICD-10-CM

## 2018-12-01 DIAGNOSIS — I5032 Chronic diastolic (congestive) heart failure: Secondary | ICD-10-CM

## 2018-12-01 DIAGNOSIS — J189 Pneumonia, unspecified organism: Secondary | ICD-10-CM

## 2018-12-02 DIAGNOSIS — N186 End stage renal disease: Secondary | ICD-10-CM

## 2018-12-02 DIAGNOSIS — J189 Pneumonia, unspecified organism: Secondary | ICD-10-CM

## 2018-12-02 DIAGNOSIS — J9621 Acute and chronic respiratory failure with hypoxia: Secondary | ICD-10-CM

## 2018-12-02 DIAGNOSIS — I5032 Chronic diastolic (congestive) heart failure: Secondary | ICD-10-CM

## 2018-12-03 DIAGNOSIS — J9621 Acute and chronic respiratory failure with hypoxia: Secondary | ICD-10-CM

## 2018-12-03 DIAGNOSIS — I5032 Chronic diastolic (congestive) heart failure: Secondary | ICD-10-CM

## 2018-12-03 DIAGNOSIS — N186 End stage renal disease: Secondary | ICD-10-CM

## 2018-12-03 DIAGNOSIS — J189 Pneumonia, unspecified organism: Secondary | ICD-10-CM

## 2018-12-04 DIAGNOSIS — I5032 Chronic diastolic (congestive) heart failure: Secondary | ICD-10-CM

## 2018-12-04 DIAGNOSIS — N186 End stage renal disease: Secondary | ICD-10-CM

## 2018-12-04 DIAGNOSIS — J189 Pneumonia, unspecified organism: Secondary | ICD-10-CM

## 2018-12-04 DIAGNOSIS — J9621 Acute and chronic respiratory failure with hypoxia: Secondary | ICD-10-CM

## 2018-12-05 ENCOUNTER — Encounter: Payer: Self-pay | Admitting: Gastroenterology

## 2018-12-12 DIAGNOSIS — I5032 Chronic diastolic (congestive) heart failure: Secondary | ICD-10-CM

## 2018-12-12 DIAGNOSIS — N186 End stage renal disease: Secondary | ICD-10-CM

## 2018-12-12 DIAGNOSIS — J9621 Acute and chronic respiratory failure with hypoxia: Secondary | ICD-10-CM

## 2018-12-12 DIAGNOSIS — J189 Pneumonia, unspecified organism: Secondary | ICD-10-CM

## 2018-12-13 DIAGNOSIS — N186 End stage renal disease: Secondary | ICD-10-CM

## 2018-12-13 DIAGNOSIS — J9621 Acute and chronic respiratory failure with hypoxia: Secondary | ICD-10-CM

## 2018-12-13 DIAGNOSIS — J189 Pneumonia, unspecified organism: Secondary | ICD-10-CM

## 2018-12-13 DIAGNOSIS — I5032 Chronic diastolic (congestive) heart failure: Secondary | ICD-10-CM

## 2018-12-14 DIAGNOSIS — J9621 Acute and chronic respiratory failure with hypoxia: Secondary | ICD-10-CM

## 2018-12-14 DIAGNOSIS — J189 Pneumonia, unspecified organism: Secondary | ICD-10-CM

## 2018-12-14 DIAGNOSIS — I5032 Chronic diastolic (congestive) heart failure: Secondary | ICD-10-CM

## 2018-12-14 DIAGNOSIS — N186 End stage renal disease: Secondary | ICD-10-CM

## 2018-12-15 DIAGNOSIS — I5032 Chronic diastolic (congestive) heart failure: Secondary | ICD-10-CM

## 2018-12-15 DIAGNOSIS — N186 End stage renal disease: Secondary | ICD-10-CM

## 2018-12-15 DIAGNOSIS — J9621 Acute and chronic respiratory failure with hypoxia: Secondary | ICD-10-CM

## 2018-12-15 DIAGNOSIS — J189 Pneumonia, unspecified organism: Secondary | ICD-10-CM

## 2018-12-16 DIAGNOSIS — N186 End stage renal disease: Secondary | ICD-10-CM

## 2018-12-16 DIAGNOSIS — I5032 Chronic diastolic (congestive) heart failure: Secondary | ICD-10-CM

## 2018-12-16 DIAGNOSIS — J9621 Acute and chronic respiratory failure with hypoxia: Secondary | ICD-10-CM

## 2018-12-16 DIAGNOSIS — J189 Pneumonia, unspecified organism: Secondary | ICD-10-CM

## 2018-12-17 DIAGNOSIS — I5032 Chronic diastolic (congestive) heart failure: Secondary | ICD-10-CM

## 2018-12-17 DIAGNOSIS — J9621 Acute and chronic respiratory failure with hypoxia: Secondary | ICD-10-CM

## 2018-12-17 DIAGNOSIS — N186 End stage renal disease: Secondary | ICD-10-CM

## 2018-12-17 DIAGNOSIS — J189 Pneumonia, unspecified organism: Secondary | ICD-10-CM

## 2018-12-18 DIAGNOSIS — J189 Pneumonia, unspecified organism: Secondary | ICD-10-CM

## 2018-12-18 DIAGNOSIS — J9621 Acute and chronic respiratory failure with hypoxia: Secondary | ICD-10-CM

## 2018-12-18 DIAGNOSIS — N186 End stage renal disease: Secondary | ICD-10-CM

## 2018-12-18 DIAGNOSIS — I5032 Chronic diastolic (congestive) heart failure: Secondary | ICD-10-CM

## 2018-12-26 ENCOUNTER — Other Ambulatory Visit: Payer: Self-pay

## 2018-12-26 DIAGNOSIS — Z992 Dependence on renal dialysis: Secondary | ICD-10-CM

## 2018-12-26 DIAGNOSIS — N186 End stage renal disease: Secondary | ICD-10-CM

## 2018-12-26 DIAGNOSIS — J189 Pneumonia, unspecified organism: Secondary | ICD-10-CM

## 2018-12-26 DIAGNOSIS — J9621 Acute and chronic respiratory failure with hypoxia: Secondary | ICD-10-CM

## 2018-12-26 DIAGNOSIS — I5032 Chronic diastolic (congestive) heart failure: Secondary | ICD-10-CM

## 2018-12-26 NOTE — Progress Notes (Deleted)
  POST OPERATIVE OFFICE NOTE    CC:  F/u for surgery  HPI:  This is a 68 y.o. female who is s/p left arm BC AVF on 11/15/2018 by Dr. Donzetta Matters.  She presents today for follow up.  ***  Allergies  Allergen Reactions  . Doxycycline     REACTION: Wheals and pruritus    Current Outpatient Medications  Medication Sig Dispense Refill  . ACCU-CHEK AVIVA PLUS test strip TEST BLOOD SUGAR THREE TIMES DAILY (Patient taking differently: 1 each by Other route 3 (three) times daily. ) 100 each 2  . Accu-Chek FastClix Lancets MISC TEST BLOOD SUGAR THREE TIMES DAILY 306 each 1  . Accu-Chek Softclix Lancets lancets TEST BLOOD SUGAR THREE TIMES DAILY (Patient taking differently: 1 each 3 (three) times daily. ) 300 each 1  . apixaban (ELIQUIS) 5 MG TABS tablet Take 1 tablet (5 mg total) by mouth 2 (two) times daily. 180 tablet 2  . atorvastatin (LIPITOR) 40 MG tablet Take 1 tablet (40 mg total) by mouth daily. 90 tablet 3  . Blood Glucose Monitoring Suppl (ACCU-CHEK AVIVA PLUS) w/Device KIT TEST BLOOD SUGAR THREE TIMES DAILY 1 kit 0  . gabapentin (NEURONTIN) 300 MG capsule TAKE 1 CAPSULE EVERY MORNING, TAKE 1 CAPSULE IN THE AFTERNOON, AND TAKE 2 CAPSULES EVERY NIGHT (Patient taking differently: Take 300 mg by mouth. TAKE 300 mg CAPSULE EVERY MORNING, TAKE 300 mg CAPSULE IN THE AFTERNOON, AND TAKE 600 mg  EVERY NIGHT) 360 capsule 3  . insulin detemir (LEVEMIR) 100 UNIT/ML injection Inject 0.15 mLs (15 Units total) into the skin at bedtime. Inject 65 units total into the skin once daily. 30 mL 5  . Insulin Pen Needle (B-D UF III MINI PEN NEEDLES) 31G X 5 MM MISC USE ONE TIME DAILY TO INJECT VICTOZA. DX:E11.9 100 each 4  . Insulin Syringe-Needle U-100 (BD VEO INSULIN SYRINGE U/F) 31G X 15/64" 1 ML MISC USE TO INJECT INSULIN ONE TIME A DAY 100 each 5  . liraglutide (VICTOZA) 18 MG/3ML SOPN Inject 0.3 mLs (1.8 mg total) into the skin daily. 9 pen 3  . metFORMIN (GLUCOPHAGE) 500 MG tablet Take 1 tablet (500 mg total) by  mouth 2 (two) times daily with a meal. 180 tablet 1  . Multiple Vitamins-Minerals (HEALTHY EYES PO) Take 1 tablet by mouth daily. Reported on 05/17/2015    . NON FORMULARY Place 2 L into the nose daily. Wears with exertion and qhs    . omeprazole (PRILOSEC) 20 MG capsule Take 20 mg by mouth daily.    Marland Kitchen POTASSIUM CHLORIDE CRYS ER PO Take 20 mEq by mouth daily. For 10 days     No current facility-administered medications for this visit.      ROS:  See HPI  Physical Exam:  ***  Incision:  *** Extremities:  *** Neuro: *** Abdomen:  ***  Assessment/Plan:  This is a 68 y.o. female who is s/p:  left arm BC AVF on 11/15/2018 by Dr. Donzetta Matters  -***   Leontine Locket, PA-C [facility] [phone]  Clinic MD:  Donzetta Matters

## 2018-12-27 DIAGNOSIS — J9621 Acute and chronic respiratory failure with hypoxia: Secondary | ICD-10-CM

## 2018-12-27 DIAGNOSIS — N186 End stage renal disease: Secondary | ICD-10-CM

## 2018-12-27 DIAGNOSIS — I5032 Chronic diastolic (congestive) heart failure: Secondary | ICD-10-CM

## 2018-12-27 DIAGNOSIS — J189 Pneumonia, unspecified organism: Secondary | ICD-10-CM

## 2018-12-28 DIAGNOSIS — J189 Pneumonia, unspecified organism: Secondary | ICD-10-CM

## 2018-12-28 DIAGNOSIS — I5032 Chronic diastolic (congestive) heart failure: Secondary | ICD-10-CM

## 2018-12-28 DIAGNOSIS — N186 End stage renal disease: Secondary | ICD-10-CM

## 2018-12-28 DIAGNOSIS — J9621 Acute and chronic respiratory failure with hypoxia: Secondary | ICD-10-CM

## 2018-12-29 DIAGNOSIS — J9621 Acute and chronic respiratory failure with hypoxia: Secondary | ICD-10-CM

## 2018-12-29 DIAGNOSIS — J189 Pneumonia, unspecified organism: Secondary | ICD-10-CM

## 2018-12-29 DIAGNOSIS — N186 End stage renal disease: Secondary | ICD-10-CM

## 2018-12-29 DIAGNOSIS — I5032 Chronic diastolic (congestive) heart failure: Secondary | ICD-10-CM

## 2018-12-30 ENCOUNTER — Inpatient Hospital Stay (HOSPITAL_COMMUNITY): Admission: RE | Admit: 2018-12-30 | Payer: Medicare HMO | Source: Ambulatory Visit

## 2018-12-30 DIAGNOSIS — I5032 Chronic diastolic (congestive) heart failure: Secondary | ICD-10-CM

## 2018-12-30 DIAGNOSIS — J189 Pneumonia, unspecified organism: Secondary | ICD-10-CM

## 2018-12-30 DIAGNOSIS — J9621 Acute and chronic respiratory failure with hypoxia: Secondary | ICD-10-CM

## 2018-12-30 DIAGNOSIS — N186 End stage renal disease: Secondary | ICD-10-CM

## 2018-12-31 DIAGNOSIS — N186 End stage renal disease: Secondary | ICD-10-CM

## 2018-12-31 DIAGNOSIS — J9621 Acute and chronic respiratory failure with hypoxia: Secondary | ICD-10-CM

## 2018-12-31 DIAGNOSIS — J189 Pneumonia, unspecified organism: Secondary | ICD-10-CM

## 2018-12-31 DIAGNOSIS — I5032 Chronic diastolic (congestive) heart failure: Secondary | ICD-10-CM

## 2019-01-01 DIAGNOSIS — N186 End stage renal disease: Secondary | ICD-10-CM

## 2019-01-01 DIAGNOSIS — I5032 Chronic diastolic (congestive) heart failure: Secondary | ICD-10-CM

## 2019-01-01 DIAGNOSIS — J9621 Acute and chronic respiratory failure with hypoxia: Secondary | ICD-10-CM

## 2019-01-01 DIAGNOSIS — J189 Pneumonia, unspecified organism: Secondary | ICD-10-CM

## 2019-01-09 DIAGNOSIS — N186 End stage renal disease: Secondary | ICD-10-CM

## 2019-01-09 DIAGNOSIS — J9621 Acute and chronic respiratory failure with hypoxia: Secondary | ICD-10-CM

## 2019-01-09 DIAGNOSIS — I5032 Chronic diastolic (congestive) heart failure: Secondary | ICD-10-CM

## 2019-01-09 DIAGNOSIS — J189 Pneumonia, unspecified organism: Secondary | ICD-10-CM

## 2019-01-10 DIAGNOSIS — J9621 Acute and chronic respiratory failure with hypoxia: Secondary | ICD-10-CM

## 2019-01-10 DIAGNOSIS — I5032 Chronic diastolic (congestive) heart failure: Secondary | ICD-10-CM

## 2019-01-10 DIAGNOSIS — J189 Pneumonia, unspecified organism: Secondary | ICD-10-CM

## 2019-01-10 DIAGNOSIS — N186 End stage renal disease: Secondary | ICD-10-CM

## 2019-01-11 DIAGNOSIS — J9621 Acute and chronic respiratory failure with hypoxia: Secondary | ICD-10-CM

## 2019-01-11 DIAGNOSIS — N186 End stage renal disease: Secondary | ICD-10-CM

## 2019-01-11 DIAGNOSIS — I5032 Chronic diastolic (congestive) heart failure: Secondary | ICD-10-CM

## 2019-01-11 DIAGNOSIS — J189 Pneumonia, unspecified organism: Secondary | ICD-10-CM

## 2019-01-12 DIAGNOSIS — J189 Pneumonia, unspecified organism: Secondary | ICD-10-CM

## 2019-01-12 DIAGNOSIS — I5032 Chronic diastolic (congestive) heart failure: Secondary | ICD-10-CM

## 2019-01-12 DIAGNOSIS — J9621 Acute and chronic respiratory failure with hypoxia: Secondary | ICD-10-CM

## 2019-01-12 DIAGNOSIS — N186 End stage renal disease: Secondary | ICD-10-CM

## 2019-01-13 DIAGNOSIS — J9621 Acute and chronic respiratory failure with hypoxia: Secondary | ICD-10-CM

## 2019-01-13 DIAGNOSIS — N186 End stage renal disease: Secondary | ICD-10-CM

## 2019-01-13 DIAGNOSIS — I5032 Chronic diastolic (congestive) heart failure: Secondary | ICD-10-CM

## 2019-01-13 DIAGNOSIS — J189 Pneumonia, unspecified organism: Secondary | ICD-10-CM

## 2019-01-14 DIAGNOSIS — I5032 Chronic diastolic (congestive) heart failure: Secondary | ICD-10-CM

## 2019-01-14 DIAGNOSIS — J9621 Acute and chronic respiratory failure with hypoxia: Secondary | ICD-10-CM

## 2019-01-14 DIAGNOSIS — J189 Pneumonia, unspecified organism: Secondary | ICD-10-CM

## 2019-01-14 DIAGNOSIS — N186 End stage renal disease: Secondary | ICD-10-CM

## 2019-01-15 DIAGNOSIS — J189 Pneumonia, unspecified organism: Secondary | ICD-10-CM

## 2019-01-15 DIAGNOSIS — N186 End stage renal disease: Secondary | ICD-10-CM

## 2019-01-15 DIAGNOSIS — I5032 Chronic diastolic (congestive) heart failure: Secondary | ICD-10-CM

## 2019-01-15 DIAGNOSIS — J9621 Acute and chronic respiratory failure with hypoxia: Secondary | ICD-10-CM

## 2019-01-16 DIAGNOSIS — M545 Low back pain: Secondary | ICD-10-CM | POA: Diagnosis not present

## 2019-01-16 DIAGNOSIS — K9423 Gastrostomy malfunction: Secondary | ICD-10-CM | POA: Diagnosis not present

## 2019-01-16 DIAGNOSIS — J9621 Acute and chronic respiratory failure with hypoxia: Secondary | ICD-10-CM | POA: Diagnosis not present

## 2019-01-16 DIAGNOSIS — N2581 Secondary hyperparathyroidism of renal origin: Secondary | ICD-10-CM | POA: Diagnosis not present

## 2019-01-16 DIAGNOSIS — E1122 Type 2 diabetes mellitus with diabetic chronic kidney disease: Secondary | ICD-10-CM | POA: Diagnosis not present

## 2019-01-16 DIAGNOSIS — Z992 Dependence on renal dialysis: Secondary | ICD-10-CM | POA: Diagnosis not present

## 2019-01-16 DIAGNOSIS — J9690 Respiratory failure, unspecified, unspecified whether with hypoxia or hypercapnia: Secondary | ICD-10-CM | POA: Diagnosis not present

## 2019-01-16 DIAGNOSIS — Z9989 Dependence on other enabling machines and devices: Secondary | ICD-10-CM | POA: Diagnosis not present

## 2019-01-16 DIAGNOSIS — I120 Hypertensive chronic kidney disease with stage 5 chronic kidney disease or end stage renal disease: Secondary | ICD-10-CM | POA: Diagnosis not present

## 2019-01-16 DIAGNOSIS — F419 Anxiety disorder, unspecified: Secondary | ICD-10-CM | POA: Diagnosis not present

## 2019-01-16 DIAGNOSIS — N186 End stage renal disease: Secondary | ICD-10-CM | POA: Diagnosis not present

## 2019-01-16 DIAGNOSIS — G4733 Obstructive sleep apnea (adult) (pediatric): Secondary | ICD-10-CM | POA: Diagnosis not present

## 2019-01-16 DIAGNOSIS — G894 Chronic pain syndrome: Secondary | ICD-10-CM | POA: Diagnosis not present

## 2019-01-16 DIAGNOSIS — E119 Type 2 diabetes mellitus without complications: Secondary | ICD-10-CM | POA: Diagnosis not present

## 2019-01-16 DIAGNOSIS — I5032 Chronic diastolic (congestive) heart failure: Secondary | ICD-10-CM | POA: Diagnosis not present

## 2019-01-16 DIAGNOSIS — E1142 Type 2 diabetes mellitus with diabetic polyneuropathy: Secondary | ICD-10-CM | POA: Diagnosis not present

## 2019-01-16 DIAGNOSIS — R1310 Dysphagia, unspecified: Secondary | ICD-10-CM | POA: Diagnosis not present

## 2019-01-17 DIAGNOSIS — G894 Chronic pain syndrome: Secondary | ICD-10-CM | POA: Diagnosis not present

## 2019-01-17 DIAGNOSIS — E119 Type 2 diabetes mellitus without complications: Secondary | ICD-10-CM | POA: Diagnosis not present

## 2019-01-17 DIAGNOSIS — F419 Anxiety disorder, unspecified: Secondary | ICD-10-CM | POA: Diagnosis not present

## 2019-01-17 DIAGNOSIS — Z9989 Dependence on other enabling machines and devices: Secondary | ICD-10-CM | POA: Diagnosis not present

## 2019-01-17 DIAGNOSIS — G4733 Obstructive sleep apnea (adult) (pediatric): Secondary | ICD-10-CM | POA: Diagnosis not present

## 2019-01-17 DIAGNOSIS — J9690 Respiratory failure, unspecified, unspecified whether with hypoxia or hypercapnia: Secondary | ICD-10-CM | POA: Diagnosis not present

## 2019-01-17 DIAGNOSIS — R262 Difficulty in walking, not elsewhere classified: Secondary | ICD-10-CM | POA: Diagnosis not present

## 2019-01-17 DIAGNOSIS — R1310 Dysphagia, unspecified: Secondary | ICD-10-CM | POA: Diagnosis not present

## 2019-01-17 DIAGNOSIS — R5381 Other malaise: Secondary | ICD-10-CM | POA: Diagnosis not present

## 2019-01-17 DIAGNOSIS — N186 End stage renal disease: Secondary | ICD-10-CM | POA: Diagnosis not present

## 2019-01-17 DIAGNOSIS — I5032 Chronic diastolic (congestive) heart failure: Secondary | ICD-10-CM | POA: Diagnosis not present

## 2019-01-17 DIAGNOSIS — M6281 Muscle weakness (generalized): Secondary | ICD-10-CM | POA: Diagnosis not present

## 2019-01-17 DIAGNOSIS — I120 Hypertensive chronic kidney disease with stage 5 chronic kidney disease or end stage renal disease: Secondary | ICD-10-CM | POA: Diagnosis not present

## 2019-01-17 DIAGNOSIS — M545 Low back pain: Secondary | ICD-10-CM | POA: Diagnosis not present

## 2019-01-17 DIAGNOSIS — E1142 Type 2 diabetes mellitus with diabetic polyneuropathy: Secondary | ICD-10-CM | POA: Diagnosis not present

## 2019-01-17 DIAGNOSIS — K9423 Gastrostomy malfunction: Secondary | ICD-10-CM | POA: Diagnosis not present

## 2019-01-17 DIAGNOSIS — N2581 Secondary hyperparathyroidism of renal origin: Secondary | ICD-10-CM | POA: Diagnosis not present

## 2019-01-17 DIAGNOSIS — J9621 Acute and chronic respiratory failure with hypoxia: Secondary | ICD-10-CM | POA: Diagnosis not present

## 2019-01-17 DIAGNOSIS — E1122 Type 2 diabetes mellitus with diabetic chronic kidney disease: Secondary | ICD-10-CM | POA: Diagnosis not present

## 2019-01-17 DIAGNOSIS — Z992 Dependence on renal dialysis: Secondary | ICD-10-CM | POA: Diagnosis not present

## 2019-01-18 DIAGNOSIS — N186 End stage renal disease: Secondary | ICD-10-CM | POA: Diagnosis not present

## 2019-01-18 DIAGNOSIS — I120 Hypertensive chronic kidney disease with stage 5 chronic kidney disease or end stage renal disease: Secondary | ICD-10-CM | POA: Diagnosis not present

## 2019-01-18 DIAGNOSIS — Z992 Dependence on renal dialysis: Secondary | ICD-10-CM | POA: Diagnosis not present

## 2019-01-18 DIAGNOSIS — J9621 Acute and chronic respiratory failure with hypoxia: Secondary | ICD-10-CM | POA: Diagnosis not present

## 2019-01-18 DIAGNOSIS — Z9989 Dependence on other enabling machines and devices: Secondary | ICD-10-CM | POA: Diagnosis not present

## 2019-01-18 DIAGNOSIS — G4733 Obstructive sleep apnea (adult) (pediatric): Secondary | ICD-10-CM | POA: Diagnosis not present

## 2019-01-18 DIAGNOSIS — I5032 Chronic diastolic (congestive) heart failure: Secondary | ICD-10-CM | POA: Diagnosis not present

## 2019-01-18 DIAGNOSIS — E1122 Type 2 diabetes mellitus with diabetic chronic kidney disease: Secondary | ICD-10-CM | POA: Diagnosis not present

## 2019-01-18 DIAGNOSIS — E119 Type 2 diabetes mellitus without complications: Secondary | ICD-10-CM | POA: Diagnosis not present

## 2019-01-18 DIAGNOSIS — N2581 Secondary hyperparathyroidism of renal origin: Secondary | ICD-10-CM | POA: Diagnosis not present

## 2019-01-19 DIAGNOSIS — N2581 Secondary hyperparathyroidism of renal origin: Secondary | ICD-10-CM | POA: Diagnosis not present

## 2019-01-19 DIAGNOSIS — R262 Difficulty in walking, not elsewhere classified: Secondary | ICD-10-CM | POA: Diagnosis not present

## 2019-01-19 DIAGNOSIS — E119 Type 2 diabetes mellitus without complications: Secondary | ICD-10-CM | POA: Diagnosis not present

## 2019-01-19 DIAGNOSIS — I5032 Chronic diastolic (congestive) heart failure: Secondary | ICD-10-CM | POA: Diagnosis not present

## 2019-01-19 DIAGNOSIS — Z992 Dependence on renal dialysis: Secondary | ICD-10-CM | POA: Diagnosis not present

## 2019-01-19 DIAGNOSIS — M6281 Muscle weakness (generalized): Secondary | ICD-10-CM | POA: Diagnosis not present

## 2019-01-19 DIAGNOSIS — J9621 Acute and chronic respiratory failure with hypoxia: Secondary | ICD-10-CM | POA: Diagnosis not present

## 2019-01-19 DIAGNOSIS — I120 Hypertensive chronic kidney disease with stage 5 chronic kidney disease or end stage renal disease: Secondary | ICD-10-CM | POA: Diagnosis not present

## 2019-01-19 DIAGNOSIS — G4733 Obstructive sleep apnea (adult) (pediatric): Secondary | ICD-10-CM | POA: Diagnosis not present

## 2019-01-19 DIAGNOSIS — R5381 Other malaise: Secondary | ICD-10-CM | POA: Diagnosis not present

## 2019-01-19 DIAGNOSIS — N186 End stage renal disease: Secondary | ICD-10-CM | POA: Diagnosis not present

## 2019-01-19 DIAGNOSIS — E1122 Type 2 diabetes mellitus with diabetic chronic kidney disease: Secondary | ICD-10-CM | POA: Diagnosis not present

## 2019-01-19 DIAGNOSIS — Z9989 Dependence on other enabling machines and devices: Secondary | ICD-10-CM | POA: Diagnosis not present

## 2019-01-20 DIAGNOSIS — G4733 Obstructive sleep apnea (adult) (pediatric): Secondary | ICD-10-CM | POA: Diagnosis not present

## 2019-01-20 DIAGNOSIS — J9690 Respiratory failure, unspecified, unspecified whether with hypoxia or hypercapnia: Secondary | ICD-10-CM | POA: Diagnosis not present

## 2019-01-20 DIAGNOSIS — I5032 Chronic diastolic (congestive) heart failure: Secondary | ICD-10-CM | POA: Diagnosis not present

## 2019-01-20 DIAGNOSIS — E1142 Type 2 diabetes mellitus with diabetic polyneuropathy: Secondary | ICD-10-CM | POA: Diagnosis not present

## 2019-01-20 DIAGNOSIS — F419 Anxiety disorder, unspecified: Secondary | ICD-10-CM | POA: Diagnosis not present

## 2019-01-20 DIAGNOSIS — G894 Chronic pain syndrome: Secondary | ICD-10-CM | POA: Diagnosis not present

## 2019-01-20 DIAGNOSIS — K9423 Gastrostomy malfunction: Secondary | ICD-10-CM | POA: Diagnosis not present

## 2019-01-20 DIAGNOSIS — N2581 Secondary hyperparathyroidism of renal origin: Secondary | ICD-10-CM | POA: Diagnosis not present

## 2019-01-20 DIAGNOSIS — Z992 Dependence on renal dialysis: Secondary | ICD-10-CM | POA: Diagnosis not present

## 2019-01-20 DIAGNOSIS — J9621 Acute and chronic respiratory failure with hypoxia: Secondary | ICD-10-CM | POA: Diagnosis not present

## 2019-01-20 DIAGNOSIS — I120 Hypertensive chronic kidney disease with stage 5 chronic kidney disease or end stage renal disease: Secondary | ICD-10-CM | POA: Diagnosis not present

## 2019-01-20 DIAGNOSIS — E1122 Type 2 diabetes mellitus with diabetic chronic kidney disease: Secondary | ICD-10-CM | POA: Diagnosis not present

## 2019-01-20 DIAGNOSIS — R1310 Dysphagia, unspecified: Secondary | ICD-10-CM | POA: Diagnosis not present

## 2019-01-20 DIAGNOSIS — Z9989 Dependence on other enabling machines and devices: Secondary | ICD-10-CM | POA: Diagnosis not present

## 2019-01-20 DIAGNOSIS — E119 Type 2 diabetes mellitus without complications: Secondary | ICD-10-CM | POA: Diagnosis not present

## 2019-01-20 DIAGNOSIS — N186 End stage renal disease: Secondary | ICD-10-CM | POA: Diagnosis not present

## 2019-01-20 DIAGNOSIS — M545 Low back pain: Secondary | ICD-10-CM | POA: Diagnosis not present

## 2019-01-21 DIAGNOSIS — G4733 Obstructive sleep apnea (adult) (pediatric): Secondary | ICD-10-CM | POA: Diagnosis not present

## 2019-01-21 DIAGNOSIS — J9621 Acute and chronic respiratory failure with hypoxia: Secondary | ICD-10-CM | POA: Diagnosis not present

## 2019-01-21 DIAGNOSIS — Z9989 Dependence on other enabling machines and devices: Secondary | ICD-10-CM | POA: Diagnosis not present

## 2019-01-22 DIAGNOSIS — E440 Moderate protein-calorie malnutrition: Secondary | ICD-10-CM | POA: Diagnosis not present

## 2019-01-22 DIAGNOSIS — J95851 Ventilator associated pneumonia: Secondary | ICD-10-CM | POA: Diagnosis not present

## 2019-01-22 DIAGNOSIS — J9601 Acute respiratory failure with hypoxia: Secondary | ICD-10-CM | POA: Diagnosis not present

## 2019-01-22 DIAGNOSIS — Z9989 Dependence on other enabling machines and devices: Secondary | ICD-10-CM | POA: Diagnosis not present

## 2019-01-22 DIAGNOSIS — G4733 Obstructive sleep apnea (adult) (pediatric): Secondary | ICD-10-CM | POA: Diagnosis not present

## 2019-01-22 DIAGNOSIS — J9621 Acute and chronic respiratory failure with hypoxia: Secondary | ICD-10-CM | POA: Diagnosis not present

## 2019-01-22 DIAGNOSIS — R030 Elevated blood-pressure reading, without diagnosis of hypertension: Secondary | ICD-10-CM | POA: Diagnosis not present

## 2019-01-22 DIAGNOSIS — E118 Type 2 diabetes mellitus with unspecified complications: Secondary | ICD-10-CM | POA: Diagnosis not present

## 2019-01-23 DIAGNOSIS — E119 Type 2 diabetes mellitus without complications: Secondary | ICD-10-CM | POA: Diagnosis not present

## 2019-01-23 DIAGNOSIS — F419 Anxiety disorder, unspecified: Secondary | ICD-10-CM | POA: Diagnosis not present

## 2019-01-23 DIAGNOSIS — J9690 Respiratory failure, unspecified, unspecified whether with hypoxia or hypercapnia: Secondary | ICD-10-CM | POA: Diagnosis not present

## 2019-01-23 DIAGNOSIS — J9621 Acute and chronic respiratory failure with hypoxia: Secondary | ICD-10-CM

## 2019-01-23 DIAGNOSIS — N2581 Secondary hyperparathyroidism of renal origin: Secondary | ICD-10-CM | POA: Diagnosis not present

## 2019-01-23 DIAGNOSIS — J189 Pneumonia, unspecified organism: Secondary | ICD-10-CM

## 2019-01-23 DIAGNOSIS — E1142 Type 2 diabetes mellitus with diabetic polyneuropathy: Secondary | ICD-10-CM | POA: Diagnosis not present

## 2019-01-23 DIAGNOSIS — E1122 Type 2 diabetes mellitus with diabetic chronic kidney disease: Secondary | ICD-10-CM | POA: Diagnosis not present

## 2019-01-23 DIAGNOSIS — I5032 Chronic diastolic (congestive) heart failure: Secondary | ICD-10-CM | POA: Diagnosis not present

## 2019-01-23 DIAGNOSIS — K9423 Gastrostomy malfunction: Secondary | ICD-10-CM | POA: Diagnosis not present

## 2019-01-23 DIAGNOSIS — G894 Chronic pain syndrome: Secondary | ICD-10-CM | POA: Diagnosis not present

## 2019-01-23 DIAGNOSIS — I120 Hypertensive chronic kidney disease with stage 5 chronic kidney disease or end stage renal disease: Secondary | ICD-10-CM | POA: Diagnosis not present

## 2019-01-23 DIAGNOSIS — N186 End stage renal disease: Secondary | ICD-10-CM | POA: Diagnosis not present

## 2019-01-23 DIAGNOSIS — Z992 Dependence on renal dialysis: Secondary | ICD-10-CM | POA: Diagnosis not present

## 2019-01-24 DIAGNOSIS — Z992 Dependence on renal dialysis: Secondary | ICD-10-CM | POA: Diagnosis not present

## 2019-01-24 DIAGNOSIS — I5032 Chronic diastolic (congestive) heart failure: Secondary | ICD-10-CM

## 2019-01-24 DIAGNOSIS — M6281 Muscle weakness (generalized): Secondary | ICD-10-CM | POA: Diagnosis not present

## 2019-01-24 DIAGNOSIS — N186 End stage renal disease: Secondary | ICD-10-CM | POA: Diagnosis not present

## 2019-01-24 DIAGNOSIS — R5381 Other malaise: Secondary | ICD-10-CM | POA: Diagnosis not present

## 2019-01-24 DIAGNOSIS — E1142 Type 2 diabetes mellitus with diabetic polyneuropathy: Secondary | ICD-10-CM | POA: Diagnosis not present

## 2019-01-24 DIAGNOSIS — I120 Hypertensive chronic kidney disease with stage 5 chronic kidney disease or end stage renal disease: Secondary | ICD-10-CM | POA: Diagnosis not present

## 2019-01-24 DIAGNOSIS — J9621 Acute and chronic respiratory failure with hypoxia: Secondary | ICD-10-CM

## 2019-01-24 DIAGNOSIS — K9423 Gastrostomy malfunction: Secondary | ICD-10-CM | POA: Diagnosis not present

## 2019-01-24 DIAGNOSIS — F419 Anxiety disorder, unspecified: Secondary | ICD-10-CM | POA: Diagnosis not present

## 2019-01-24 DIAGNOSIS — E1122 Type 2 diabetes mellitus with diabetic chronic kidney disease: Secondary | ICD-10-CM | POA: Diagnosis not present

## 2019-01-24 DIAGNOSIS — N2581 Secondary hyperparathyroidism of renal origin: Secondary | ICD-10-CM | POA: Diagnosis not present

## 2019-01-24 DIAGNOSIS — J189 Pneumonia, unspecified organism: Secondary | ICD-10-CM | POA: Diagnosis not present

## 2019-01-24 DIAGNOSIS — G894 Chronic pain syndrome: Secondary | ICD-10-CM | POA: Diagnosis not present

## 2019-01-24 DIAGNOSIS — E119 Type 2 diabetes mellitus without complications: Secondary | ICD-10-CM | POA: Diagnosis not present

## 2019-01-24 DIAGNOSIS — J9690 Respiratory failure, unspecified, unspecified whether with hypoxia or hypercapnia: Secondary | ICD-10-CM | POA: Diagnosis not present

## 2019-01-24 DIAGNOSIS — R262 Difficulty in walking, not elsewhere classified: Secondary | ICD-10-CM | POA: Diagnosis not present

## 2019-01-25 DIAGNOSIS — N186 End stage renal disease: Secondary | ICD-10-CM

## 2019-01-25 DIAGNOSIS — J9621 Acute and chronic respiratory failure with hypoxia: Secondary | ICD-10-CM

## 2019-01-25 DIAGNOSIS — E119 Type 2 diabetes mellitus without complications: Secondary | ICD-10-CM | POA: Diagnosis not present

## 2019-01-25 DIAGNOSIS — Z992 Dependence on renal dialysis: Secondary | ICD-10-CM | POA: Diagnosis not present

## 2019-01-25 DIAGNOSIS — J189 Pneumonia, unspecified organism: Secondary | ICD-10-CM

## 2019-01-25 DIAGNOSIS — E1122 Type 2 diabetes mellitus with diabetic chronic kidney disease: Secondary | ICD-10-CM | POA: Diagnosis not present

## 2019-01-25 DIAGNOSIS — N2581 Secondary hyperparathyroidism of renal origin: Secondary | ICD-10-CM | POA: Diagnosis not present

## 2019-01-25 DIAGNOSIS — I5032 Chronic diastolic (congestive) heart failure: Secondary | ICD-10-CM

## 2019-01-25 DIAGNOSIS — I120 Hypertensive chronic kidney disease with stage 5 chronic kidney disease or end stage renal disease: Secondary | ICD-10-CM | POA: Diagnosis not present

## 2019-01-26 DIAGNOSIS — E1122 Type 2 diabetes mellitus with diabetic chronic kidney disease: Secondary | ICD-10-CM | POA: Diagnosis not present

## 2019-01-26 DIAGNOSIS — E119 Type 2 diabetes mellitus without complications: Secondary | ICD-10-CM | POA: Diagnosis not present

## 2019-01-26 DIAGNOSIS — I5032 Chronic diastolic (congestive) heart failure: Secondary | ICD-10-CM | POA: Diagnosis not present

## 2019-01-26 DIAGNOSIS — N2581 Secondary hyperparathyroidism of renal origin: Secondary | ICD-10-CM | POA: Diagnosis not present

## 2019-01-26 DIAGNOSIS — N186 End stage renal disease: Secondary | ICD-10-CM

## 2019-01-26 DIAGNOSIS — M6281 Muscle weakness (generalized): Secondary | ICD-10-CM | POA: Diagnosis not present

## 2019-01-26 DIAGNOSIS — I120 Hypertensive chronic kidney disease with stage 5 chronic kidney disease or end stage renal disease: Secondary | ICD-10-CM | POA: Diagnosis not present

## 2019-01-26 DIAGNOSIS — J189 Pneumonia, unspecified organism: Secondary | ICD-10-CM | POA: Diagnosis not present

## 2019-01-26 DIAGNOSIS — R5381 Other malaise: Secondary | ICD-10-CM | POA: Diagnosis not present

## 2019-01-26 DIAGNOSIS — R262 Difficulty in walking, not elsewhere classified: Secondary | ICD-10-CM | POA: Diagnosis not present

## 2019-01-26 DIAGNOSIS — Z992 Dependence on renal dialysis: Secondary | ICD-10-CM | POA: Diagnosis not present

## 2019-01-26 DIAGNOSIS — J9621 Acute and chronic respiratory failure with hypoxia: Secondary | ICD-10-CM

## 2019-01-27 DIAGNOSIS — F419 Anxiety disorder, unspecified: Secondary | ICD-10-CM | POA: Diagnosis not present

## 2019-01-27 DIAGNOSIS — E1142 Type 2 diabetes mellitus with diabetic polyneuropathy: Secondary | ICD-10-CM | POA: Diagnosis not present

## 2019-01-27 DIAGNOSIS — I120 Hypertensive chronic kidney disease with stage 5 chronic kidney disease or end stage renal disease: Secondary | ICD-10-CM | POA: Diagnosis not present

## 2019-01-27 DIAGNOSIS — R262 Difficulty in walking, not elsewhere classified: Secondary | ICD-10-CM | POA: Diagnosis not present

## 2019-01-27 DIAGNOSIS — J9690 Respiratory failure, unspecified, unspecified whether with hypoxia or hypercapnia: Secondary | ICD-10-CM | POA: Diagnosis not present

## 2019-01-27 DIAGNOSIS — G894 Chronic pain syndrome: Secondary | ICD-10-CM | POA: Diagnosis not present

## 2019-01-27 DIAGNOSIS — I5032 Chronic diastolic (congestive) heart failure: Secondary | ICD-10-CM

## 2019-01-27 DIAGNOSIS — E119 Type 2 diabetes mellitus without complications: Secondary | ICD-10-CM | POA: Diagnosis not present

## 2019-01-27 DIAGNOSIS — J9621 Acute and chronic respiratory failure with hypoxia: Secondary | ICD-10-CM

## 2019-01-27 DIAGNOSIS — R5381 Other malaise: Secondary | ICD-10-CM | POA: Diagnosis not present

## 2019-01-27 DIAGNOSIS — J189 Pneumonia, unspecified organism: Secondary | ICD-10-CM

## 2019-01-27 DIAGNOSIS — N2581 Secondary hyperparathyroidism of renal origin: Secondary | ICD-10-CM | POA: Diagnosis not present

## 2019-01-27 DIAGNOSIS — Z992 Dependence on renal dialysis: Secondary | ICD-10-CM | POA: Diagnosis not present

## 2019-01-27 DIAGNOSIS — N186 End stage renal disease: Secondary | ICD-10-CM

## 2019-01-27 DIAGNOSIS — M6281 Muscle weakness (generalized): Secondary | ICD-10-CM | POA: Diagnosis not present

## 2019-01-27 DIAGNOSIS — K9423 Gastrostomy malfunction: Secondary | ICD-10-CM | POA: Diagnosis not present

## 2019-01-27 DIAGNOSIS — E1122 Type 2 diabetes mellitus with diabetic chronic kidney disease: Secondary | ICD-10-CM | POA: Diagnosis not present

## 2019-01-28 DIAGNOSIS — E118 Type 2 diabetes mellitus with unspecified complications: Secondary | ICD-10-CM | POA: Diagnosis not present

## 2019-01-28 DIAGNOSIS — J95851 Ventilator associated pneumonia: Secondary | ICD-10-CM | POA: Diagnosis not present

## 2019-01-28 DIAGNOSIS — E440 Moderate protein-calorie malnutrition: Secondary | ICD-10-CM | POA: Diagnosis not present

## 2019-01-28 DIAGNOSIS — J9621 Acute and chronic respiratory failure with hypoxia: Secondary | ICD-10-CM

## 2019-01-28 DIAGNOSIS — J9601 Acute respiratory failure with hypoxia: Secondary | ICD-10-CM | POA: Diagnosis not present

## 2019-01-28 DIAGNOSIS — J189 Pneumonia, unspecified organism: Secondary | ICD-10-CM

## 2019-01-28 DIAGNOSIS — R030 Elevated blood-pressure reading, without diagnosis of hypertension: Secondary | ICD-10-CM | POA: Diagnosis not present

## 2019-01-28 DIAGNOSIS — I5032 Chronic diastolic (congestive) heart failure: Secondary | ICD-10-CM

## 2019-01-28 DIAGNOSIS — N186 End stage renal disease: Secondary | ICD-10-CM

## 2019-01-29 DIAGNOSIS — J95851 Ventilator associated pneumonia: Secondary | ICD-10-CM | POA: Diagnosis not present

## 2019-01-29 DIAGNOSIS — E440 Moderate protein-calorie malnutrition: Secondary | ICD-10-CM | POA: Diagnosis not present

## 2019-01-29 DIAGNOSIS — E118 Type 2 diabetes mellitus with unspecified complications: Secondary | ICD-10-CM | POA: Diagnosis not present

## 2019-01-29 DIAGNOSIS — J9601 Acute respiratory failure with hypoxia: Secondary | ICD-10-CM | POA: Diagnosis not present

## 2019-01-29 DIAGNOSIS — N186 End stage renal disease: Secondary | ICD-10-CM

## 2019-01-29 DIAGNOSIS — J189 Pneumonia, unspecified organism: Secondary | ICD-10-CM

## 2019-01-29 DIAGNOSIS — J9621 Acute and chronic respiratory failure with hypoxia: Secondary | ICD-10-CM

## 2019-01-29 DIAGNOSIS — R030 Elevated blood-pressure reading, without diagnosis of hypertension: Secondary | ICD-10-CM | POA: Diagnosis not present

## 2019-01-29 DIAGNOSIS — I5032 Chronic diastolic (congestive) heart failure: Secondary | ICD-10-CM

## 2019-01-30 DIAGNOSIS — Z992 Dependence on renal dialysis: Secondary | ICD-10-CM | POA: Diagnosis not present

## 2019-01-30 DIAGNOSIS — F419 Anxiety disorder, unspecified: Secondary | ICD-10-CM | POA: Diagnosis not present

## 2019-01-30 DIAGNOSIS — M6281 Muscle weakness (generalized): Secondary | ICD-10-CM | POA: Diagnosis not present

## 2019-01-30 DIAGNOSIS — J9690 Respiratory failure, unspecified, unspecified whether with hypoxia or hypercapnia: Secondary | ICD-10-CM | POA: Diagnosis not present

## 2019-01-30 DIAGNOSIS — E1122 Type 2 diabetes mellitus with diabetic chronic kidney disease: Secondary | ICD-10-CM | POA: Diagnosis not present

## 2019-01-30 DIAGNOSIS — G894 Chronic pain syndrome: Secondary | ICD-10-CM | POA: Diagnosis not present

## 2019-01-30 DIAGNOSIS — N186 End stage renal disease: Secondary | ICD-10-CM | POA: Diagnosis not present

## 2019-01-30 DIAGNOSIS — E119 Type 2 diabetes mellitus without complications: Secondary | ICD-10-CM | POA: Diagnosis not present

## 2019-01-30 DIAGNOSIS — R262 Difficulty in walking, not elsewhere classified: Secondary | ICD-10-CM | POA: Diagnosis not present

## 2019-01-30 DIAGNOSIS — I120 Hypertensive chronic kidney disease with stage 5 chronic kidney disease or end stage renal disease: Secondary | ICD-10-CM | POA: Diagnosis not present

## 2019-01-30 DIAGNOSIS — E1142 Type 2 diabetes mellitus with diabetic polyneuropathy: Secondary | ICD-10-CM | POA: Diagnosis not present

## 2019-01-30 DIAGNOSIS — K9423 Gastrostomy malfunction: Secondary | ICD-10-CM | POA: Diagnosis not present

## 2019-01-30 DIAGNOSIS — N2581 Secondary hyperparathyroidism of renal origin: Secondary | ICD-10-CM | POA: Diagnosis not present

## 2019-01-30 DIAGNOSIS — R5381 Other malaise: Secondary | ICD-10-CM | POA: Diagnosis not present

## 2019-01-31 DIAGNOSIS — J9690 Respiratory failure, unspecified, unspecified whether with hypoxia or hypercapnia: Secondary | ICD-10-CM | POA: Diagnosis not present

## 2019-01-31 DIAGNOSIS — K9423 Gastrostomy malfunction: Secondary | ICD-10-CM | POA: Diagnosis not present

## 2019-01-31 DIAGNOSIS — F419 Anxiety disorder, unspecified: Secondary | ICD-10-CM | POA: Diagnosis not present

## 2019-01-31 DIAGNOSIS — E119 Type 2 diabetes mellitus without complications: Secondary | ICD-10-CM | POA: Diagnosis not present

## 2019-01-31 DIAGNOSIS — E1122 Type 2 diabetes mellitus with diabetic chronic kidney disease: Secondary | ICD-10-CM | POA: Diagnosis not present

## 2019-01-31 DIAGNOSIS — Z992 Dependence on renal dialysis: Secondary | ICD-10-CM | POA: Diagnosis not present

## 2019-01-31 DIAGNOSIS — G894 Chronic pain syndrome: Secondary | ICD-10-CM | POA: Diagnosis not present

## 2019-01-31 DIAGNOSIS — I120 Hypertensive chronic kidney disease with stage 5 chronic kidney disease or end stage renal disease: Secondary | ICD-10-CM | POA: Diagnosis not present

## 2019-01-31 DIAGNOSIS — N186 End stage renal disease: Secondary | ICD-10-CM | POA: Diagnosis not present

## 2019-01-31 DIAGNOSIS — E1142 Type 2 diabetes mellitus with diabetic polyneuropathy: Secondary | ICD-10-CM | POA: Diagnosis not present

## 2019-01-31 DIAGNOSIS — N2581 Secondary hyperparathyroidism of renal origin: Secondary | ICD-10-CM | POA: Diagnosis not present

## 2019-02-01 DIAGNOSIS — I120 Hypertensive chronic kidney disease with stage 5 chronic kidney disease or end stage renal disease: Secondary | ICD-10-CM | POA: Diagnosis not present

## 2019-02-01 DIAGNOSIS — N2581 Secondary hyperparathyroidism of renal origin: Secondary | ICD-10-CM | POA: Diagnosis not present

## 2019-02-01 DIAGNOSIS — R5381 Other malaise: Secondary | ICD-10-CM | POA: Diagnosis not present

## 2019-02-01 DIAGNOSIS — Z992 Dependence on renal dialysis: Secondary | ICD-10-CM | POA: Diagnosis not present

## 2019-02-01 DIAGNOSIS — R262 Difficulty in walking, not elsewhere classified: Secondary | ICD-10-CM | POA: Diagnosis not present

## 2019-02-01 DIAGNOSIS — N186 End stage renal disease: Secondary | ICD-10-CM | POA: Diagnosis not present

## 2019-02-01 DIAGNOSIS — M6281 Muscle weakness (generalized): Secondary | ICD-10-CM | POA: Diagnosis not present

## 2019-02-01 DIAGNOSIS — E1122 Type 2 diabetes mellitus with diabetic chronic kidney disease: Secondary | ICD-10-CM | POA: Diagnosis not present

## 2019-02-02 DIAGNOSIS — N2581 Secondary hyperparathyroidism of renal origin: Secondary | ICD-10-CM | POA: Diagnosis not present

## 2019-02-02 DIAGNOSIS — Z992 Dependence on renal dialysis: Secondary | ICD-10-CM | POA: Diagnosis not present

## 2019-02-02 DIAGNOSIS — N186 End stage renal disease: Secondary | ICD-10-CM | POA: Diagnosis not present

## 2019-02-02 DIAGNOSIS — I120 Hypertensive chronic kidney disease with stage 5 chronic kidney disease or end stage renal disease: Secondary | ICD-10-CM | POA: Diagnosis not present

## 2019-02-02 DIAGNOSIS — E1122 Type 2 diabetes mellitus with diabetic chronic kidney disease: Secondary | ICD-10-CM | POA: Diagnosis not present

## 2019-02-03 DIAGNOSIS — E1122 Type 2 diabetes mellitus with diabetic chronic kidney disease: Secondary | ICD-10-CM | POA: Diagnosis not present

## 2019-02-03 DIAGNOSIS — E1142 Type 2 diabetes mellitus with diabetic polyneuropathy: Secondary | ICD-10-CM | POA: Diagnosis not present

## 2019-02-03 DIAGNOSIS — E119 Type 2 diabetes mellitus without complications: Secondary | ICD-10-CM | POA: Diagnosis not present

## 2019-02-03 DIAGNOSIS — N186 End stage renal disease: Secondary | ICD-10-CM | POA: Diagnosis not present

## 2019-02-03 DIAGNOSIS — N2581 Secondary hyperparathyroidism of renal origin: Secondary | ICD-10-CM | POA: Diagnosis not present

## 2019-02-03 DIAGNOSIS — Z992 Dependence on renal dialysis: Secondary | ICD-10-CM | POA: Diagnosis not present

## 2019-02-03 DIAGNOSIS — J9690 Respiratory failure, unspecified, unspecified whether with hypoxia or hypercapnia: Secondary | ICD-10-CM | POA: Diagnosis not present

## 2019-02-03 DIAGNOSIS — F419 Anxiety disorder, unspecified: Secondary | ICD-10-CM | POA: Diagnosis not present

## 2019-02-03 DIAGNOSIS — G894 Chronic pain syndrome: Secondary | ICD-10-CM | POA: Diagnosis not present

## 2019-02-03 DIAGNOSIS — I120 Hypertensive chronic kidney disease with stage 5 chronic kidney disease or end stage renal disease: Secondary | ICD-10-CM | POA: Diagnosis not present

## 2019-02-03 DIAGNOSIS — K9423 Gastrostomy malfunction: Secondary | ICD-10-CM | POA: Diagnosis not present

## 2019-02-04 DIAGNOSIS — R1310 Dysphagia, unspecified: Secondary | ICD-10-CM | POA: Diagnosis not present

## 2019-02-04 DIAGNOSIS — J9620 Acute and chronic respiratory failure, unspecified whether with hypoxia or hypercapnia: Secondary | ICD-10-CM | POA: Diagnosis not present

## 2019-02-05 DIAGNOSIS — R1310 Dysphagia, unspecified: Secondary | ICD-10-CM | POA: Diagnosis not present

## 2019-02-05 DIAGNOSIS — J9620 Acute and chronic respiratory failure, unspecified whether with hypoxia or hypercapnia: Secondary | ICD-10-CM | POA: Diagnosis not present

## 2019-02-06 DIAGNOSIS — G894 Chronic pain syndrome: Secondary | ICD-10-CM | POA: Diagnosis not present

## 2019-02-06 DIAGNOSIS — I5032 Chronic diastolic (congestive) heart failure: Secondary | ICD-10-CM

## 2019-02-06 DIAGNOSIS — N2581 Secondary hyperparathyroidism of renal origin: Secondary | ICD-10-CM | POA: Diagnosis not present

## 2019-02-06 DIAGNOSIS — R918 Other nonspecific abnormal finding of lung field: Secondary | ICD-10-CM | POA: Diagnosis not present

## 2019-02-06 DIAGNOSIS — Z992 Dependence on renal dialysis: Secondary | ICD-10-CM | POA: Diagnosis not present

## 2019-02-06 DIAGNOSIS — E119 Type 2 diabetes mellitus without complications: Secondary | ICD-10-CM | POA: Diagnosis not present

## 2019-02-06 DIAGNOSIS — K9423 Gastrostomy malfunction: Secondary | ICD-10-CM | POA: Diagnosis not present

## 2019-02-06 DIAGNOSIS — F419 Anxiety disorder, unspecified: Secondary | ICD-10-CM | POA: Diagnosis not present

## 2019-02-06 DIAGNOSIS — R262 Difficulty in walking, not elsewhere classified: Secondary | ICD-10-CM | POA: Diagnosis not present

## 2019-02-06 DIAGNOSIS — I120 Hypertensive chronic kidney disease with stage 5 chronic kidney disease or end stage renal disease: Secondary | ICD-10-CM | POA: Diagnosis not present

## 2019-02-06 DIAGNOSIS — R5381 Other malaise: Secondary | ICD-10-CM | POA: Diagnosis not present

## 2019-02-06 DIAGNOSIS — J189 Pneumonia, unspecified organism: Secondary | ICD-10-CM

## 2019-02-06 DIAGNOSIS — E1122 Type 2 diabetes mellitus with diabetic chronic kidney disease: Secondary | ICD-10-CM | POA: Diagnosis not present

## 2019-02-06 DIAGNOSIS — J9690 Respiratory failure, unspecified, unspecified whether with hypoxia or hypercapnia: Secondary | ICD-10-CM | POA: Diagnosis not present

## 2019-02-06 DIAGNOSIS — Z09 Encounter for follow-up examination after completed treatment for conditions other than malignant neoplasm: Secondary | ICD-10-CM | POA: Diagnosis not present

## 2019-02-06 DIAGNOSIS — M6281 Muscle weakness (generalized): Secondary | ICD-10-CM | POA: Diagnosis not present

## 2019-02-06 DIAGNOSIS — N186 End stage renal disease: Secondary | ICD-10-CM

## 2019-02-06 DIAGNOSIS — J9621 Acute and chronic respiratory failure with hypoxia: Secondary | ICD-10-CM

## 2019-02-06 DIAGNOSIS — E1142 Type 2 diabetes mellitus with diabetic polyneuropathy: Secondary | ICD-10-CM | POA: Diagnosis not present

## 2019-02-07 DIAGNOSIS — E1142 Type 2 diabetes mellitus with diabetic polyneuropathy: Secondary | ICD-10-CM | POA: Diagnosis not present

## 2019-02-07 DIAGNOSIS — E119 Type 2 diabetes mellitus without complications: Secondary | ICD-10-CM | POA: Diagnosis not present

## 2019-02-07 DIAGNOSIS — K9423 Gastrostomy malfunction: Secondary | ICD-10-CM | POA: Diagnosis not present

## 2019-02-07 DIAGNOSIS — I120 Hypertensive chronic kidney disease with stage 5 chronic kidney disease or end stage renal disease: Secondary | ICD-10-CM | POA: Diagnosis not present

## 2019-02-07 DIAGNOSIS — Z992 Dependence on renal dialysis: Secondary | ICD-10-CM | POA: Diagnosis not present

## 2019-02-07 DIAGNOSIS — J189 Pneumonia, unspecified organism: Secondary | ICD-10-CM

## 2019-02-07 DIAGNOSIS — N2581 Secondary hyperparathyroidism of renal origin: Secondary | ICD-10-CM | POA: Diagnosis not present

## 2019-02-07 DIAGNOSIS — E1122 Type 2 diabetes mellitus with diabetic chronic kidney disease: Secondary | ICD-10-CM | POA: Diagnosis not present

## 2019-02-07 DIAGNOSIS — I5032 Chronic diastolic (congestive) heart failure: Secondary | ICD-10-CM

## 2019-02-07 DIAGNOSIS — G894 Chronic pain syndrome: Secondary | ICD-10-CM | POA: Diagnosis not present

## 2019-02-07 DIAGNOSIS — J9690 Respiratory failure, unspecified, unspecified whether with hypoxia or hypercapnia: Secondary | ICD-10-CM | POA: Diagnosis not present

## 2019-02-07 DIAGNOSIS — N186 End stage renal disease: Secondary | ICD-10-CM | POA: Diagnosis not present

## 2019-02-07 DIAGNOSIS — F419 Anxiety disorder, unspecified: Secondary | ICD-10-CM | POA: Diagnosis not present

## 2019-02-07 DIAGNOSIS — J9621 Acute and chronic respiratory failure with hypoxia: Secondary | ICD-10-CM

## 2019-02-08 DIAGNOSIS — Z992 Dependence on renal dialysis: Secondary | ICD-10-CM | POA: Diagnosis not present

## 2019-02-08 DIAGNOSIS — N2581 Secondary hyperparathyroidism of renal origin: Secondary | ICD-10-CM | POA: Diagnosis not present

## 2019-02-08 DIAGNOSIS — J189 Pneumonia, unspecified organism: Secondary | ICD-10-CM

## 2019-02-08 DIAGNOSIS — E1122 Type 2 diabetes mellitus with diabetic chronic kidney disease: Secondary | ICD-10-CM | POA: Diagnosis not present

## 2019-02-08 DIAGNOSIS — I120 Hypertensive chronic kidney disease with stage 5 chronic kidney disease or end stage renal disease: Secondary | ICD-10-CM | POA: Diagnosis not present

## 2019-02-08 DIAGNOSIS — I5032 Chronic diastolic (congestive) heart failure: Secondary | ICD-10-CM

## 2019-02-08 DIAGNOSIS — N186 End stage renal disease: Secondary | ICD-10-CM | POA: Diagnosis not present

## 2019-02-08 DIAGNOSIS — R5381 Other malaise: Secondary | ICD-10-CM | POA: Diagnosis not present

## 2019-02-08 DIAGNOSIS — R262 Difficulty in walking, not elsewhere classified: Secondary | ICD-10-CM | POA: Diagnosis not present

## 2019-02-08 DIAGNOSIS — M6281 Muscle weakness (generalized): Secondary | ICD-10-CM | POA: Diagnosis not present

## 2019-02-08 DIAGNOSIS — J9621 Acute and chronic respiratory failure with hypoxia: Secondary | ICD-10-CM

## 2019-02-09 DIAGNOSIS — J9621 Acute and chronic respiratory failure with hypoxia: Secondary | ICD-10-CM

## 2019-02-09 DIAGNOSIS — I120 Hypertensive chronic kidney disease with stage 5 chronic kidney disease or end stage renal disease: Secondary | ICD-10-CM | POA: Diagnosis not present

## 2019-02-09 DIAGNOSIS — I5032 Chronic diastolic (congestive) heart failure: Secondary | ICD-10-CM

## 2019-02-09 DIAGNOSIS — J189 Pneumonia, unspecified organism: Secondary | ICD-10-CM | POA: Diagnosis not present

## 2019-02-09 DIAGNOSIS — Z992 Dependence on renal dialysis: Secondary | ICD-10-CM | POA: Diagnosis not present

## 2019-02-09 DIAGNOSIS — E1122 Type 2 diabetes mellitus with diabetic chronic kidney disease: Secondary | ICD-10-CM | POA: Diagnosis not present

## 2019-02-09 DIAGNOSIS — N186 End stage renal disease: Secondary | ICD-10-CM

## 2019-02-09 DIAGNOSIS — N2581 Secondary hyperparathyroidism of renal origin: Secondary | ICD-10-CM | POA: Diagnosis not present

## 2019-02-10 DIAGNOSIS — E1142 Type 2 diabetes mellitus with diabetic polyneuropathy: Secondary | ICD-10-CM | POA: Diagnosis not present

## 2019-02-10 DIAGNOSIS — E1122 Type 2 diabetes mellitus with diabetic chronic kidney disease: Secondary | ICD-10-CM | POA: Diagnosis not present

## 2019-02-10 DIAGNOSIS — K9423 Gastrostomy malfunction: Secondary | ICD-10-CM | POA: Diagnosis not present

## 2019-02-10 DIAGNOSIS — Z992 Dependence on renal dialysis: Secondary | ICD-10-CM | POA: Diagnosis not present

## 2019-02-10 DIAGNOSIS — J189 Pneumonia, unspecified organism: Secondary | ICD-10-CM

## 2019-02-10 DIAGNOSIS — E119 Type 2 diabetes mellitus without complications: Secondary | ICD-10-CM | POA: Diagnosis not present

## 2019-02-10 DIAGNOSIS — N186 End stage renal disease: Secondary | ICD-10-CM | POA: Diagnosis not present

## 2019-02-10 DIAGNOSIS — I5032 Chronic diastolic (congestive) heart failure: Secondary | ICD-10-CM

## 2019-02-10 DIAGNOSIS — J9621 Acute and chronic respiratory failure with hypoxia: Secondary | ICD-10-CM

## 2019-02-10 DIAGNOSIS — G894 Chronic pain syndrome: Secondary | ICD-10-CM | POA: Diagnosis not present

## 2019-02-10 DIAGNOSIS — I120 Hypertensive chronic kidney disease with stage 5 chronic kidney disease or end stage renal disease: Secondary | ICD-10-CM | POA: Diagnosis not present

## 2019-02-10 DIAGNOSIS — J9690 Respiratory failure, unspecified, unspecified whether with hypoxia or hypercapnia: Secondary | ICD-10-CM | POA: Diagnosis not present

## 2019-02-10 DIAGNOSIS — F419 Anxiety disorder, unspecified: Secondary | ICD-10-CM | POA: Diagnosis not present

## 2019-02-10 DIAGNOSIS — N2581 Secondary hyperparathyroidism of renal origin: Secondary | ICD-10-CM | POA: Diagnosis not present

## 2019-02-11 DIAGNOSIS — I5032 Chronic diastolic (congestive) heart failure: Secondary | ICD-10-CM

## 2019-02-11 DIAGNOSIS — J189 Pneumonia, unspecified organism: Secondary | ICD-10-CM

## 2019-02-11 DIAGNOSIS — N186 End stage renal disease: Secondary | ICD-10-CM

## 2019-02-11 DIAGNOSIS — J9621 Acute and chronic respiratory failure with hypoxia: Secondary | ICD-10-CM

## 2019-02-11 DIAGNOSIS — N179 Acute kidney failure, unspecified: Secondary | ICD-10-CM | POA: Diagnosis not present

## 2019-02-11 DIAGNOSIS — I5033 Acute on chronic diastolic (congestive) heart failure: Secondary | ICD-10-CM | POA: Diagnosis not present

## 2019-02-11 DIAGNOSIS — L89153 Pressure ulcer of sacral region, stage 3: Secondary | ICD-10-CM | POA: Diagnosis not present

## 2019-02-11 DIAGNOSIS — E1165 Type 2 diabetes mellitus with hyperglycemia: Secondary | ICD-10-CM | POA: Diagnosis not present

## 2019-02-12 DIAGNOSIS — L89153 Pressure ulcer of sacral region, stage 3: Secondary | ICD-10-CM | POA: Diagnosis not present

## 2019-02-12 DIAGNOSIS — N179 Acute kidney failure, unspecified: Secondary | ICD-10-CM | POA: Diagnosis not present

## 2019-02-12 DIAGNOSIS — N186 End stage renal disease: Secondary | ICD-10-CM

## 2019-02-12 DIAGNOSIS — E1165 Type 2 diabetes mellitus with hyperglycemia: Secondary | ICD-10-CM | POA: Diagnosis not present

## 2019-02-12 DIAGNOSIS — I5033 Acute on chronic diastolic (congestive) heart failure: Secondary | ICD-10-CM | POA: Diagnosis not present

## 2019-02-12 DIAGNOSIS — I5032 Chronic diastolic (congestive) heart failure: Secondary | ICD-10-CM

## 2019-02-12 DIAGNOSIS — J189 Pneumonia, unspecified organism: Secondary | ICD-10-CM

## 2019-02-12 DIAGNOSIS — J9621 Acute and chronic respiratory failure with hypoxia: Secondary | ICD-10-CM

## 2019-02-13 DIAGNOSIS — E119 Type 2 diabetes mellitus without complications: Secondary | ICD-10-CM | POA: Diagnosis not present

## 2019-02-13 DIAGNOSIS — I120 Hypertensive chronic kidney disease with stage 5 chronic kidney disease or end stage renal disease: Secondary | ICD-10-CM | POA: Diagnosis not present

## 2019-02-13 DIAGNOSIS — E1142 Type 2 diabetes mellitus with diabetic polyneuropathy: Secondary | ICD-10-CM | POA: Diagnosis not present

## 2019-02-13 DIAGNOSIS — N186 End stage renal disease: Secondary | ICD-10-CM | POA: Diagnosis not present

## 2019-02-13 DIAGNOSIS — Z992 Dependence on renal dialysis: Secondary | ICD-10-CM | POA: Diagnosis not present

## 2019-02-13 DIAGNOSIS — J9690 Respiratory failure, unspecified, unspecified whether with hypoxia or hypercapnia: Secondary | ICD-10-CM | POA: Diagnosis not present

## 2019-02-13 DIAGNOSIS — N2581 Secondary hyperparathyroidism of renal origin: Secondary | ICD-10-CM | POA: Diagnosis not present

## 2019-02-13 DIAGNOSIS — G894 Chronic pain syndrome: Secondary | ICD-10-CM | POA: Diagnosis not present

## 2019-02-13 DIAGNOSIS — F419 Anxiety disorder, unspecified: Secondary | ICD-10-CM | POA: Diagnosis not present

## 2019-02-13 DIAGNOSIS — K9423 Gastrostomy malfunction: Secondary | ICD-10-CM | POA: Diagnosis not present

## 2019-02-13 DIAGNOSIS — R1310 Dysphagia, unspecified: Secondary | ICD-10-CM | POA: Diagnosis not present

## 2019-02-13 DIAGNOSIS — J9620 Acute and chronic respiratory failure, unspecified whether with hypoxia or hypercapnia: Secondary | ICD-10-CM | POA: Diagnosis not present

## 2019-02-13 DIAGNOSIS — E1122 Type 2 diabetes mellitus with diabetic chronic kidney disease: Secondary | ICD-10-CM | POA: Diagnosis not present

## 2019-02-14 DIAGNOSIS — Z992 Dependence on renal dialysis: Secondary | ICD-10-CM | POA: Diagnosis not present

## 2019-02-14 DIAGNOSIS — G894 Chronic pain syndrome: Secondary | ICD-10-CM | POA: Diagnosis not present

## 2019-02-14 DIAGNOSIS — E1142 Type 2 diabetes mellitus with diabetic polyneuropathy: Secondary | ICD-10-CM | POA: Diagnosis not present

## 2019-02-14 DIAGNOSIS — R1310 Dysphagia, unspecified: Secondary | ICD-10-CM | POA: Diagnosis not present

## 2019-02-14 DIAGNOSIS — N2581 Secondary hyperparathyroidism of renal origin: Secondary | ICD-10-CM | POA: Diagnosis not present

## 2019-02-14 DIAGNOSIS — E1122 Type 2 diabetes mellitus with diabetic chronic kidney disease: Secondary | ICD-10-CM | POA: Diagnosis not present

## 2019-02-14 DIAGNOSIS — E119 Type 2 diabetes mellitus without complications: Secondary | ICD-10-CM | POA: Diagnosis not present

## 2019-02-14 DIAGNOSIS — N186 End stage renal disease: Secondary | ICD-10-CM | POA: Diagnosis not present

## 2019-02-14 DIAGNOSIS — J9620 Acute and chronic respiratory failure, unspecified whether with hypoxia or hypercapnia: Secondary | ICD-10-CM | POA: Diagnosis not present

## 2019-02-14 DIAGNOSIS — K9423 Gastrostomy malfunction: Secondary | ICD-10-CM | POA: Diagnosis not present

## 2019-02-14 DIAGNOSIS — J9690 Respiratory failure, unspecified, unspecified whether with hypoxia or hypercapnia: Secondary | ICD-10-CM | POA: Diagnosis not present

## 2019-02-14 DIAGNOSIS — F419 Anxiety disorder, unspecified: Secondary | ICD-10-CM | POA: Diagnosis not present

## 2019-02-14 DIAGNOSIS — I120 Hypertensive chronic kidney disease with stage 5 chronic kidney disease or end stage renal disease: Secondary | ICD-10-CM | POA: Diagnosis not present

## 2019-02-15 DIAGNOSIS — E1122 Type 2 diabetes mellitus with diabetic chronic kidney disease: Secondary | ICD-10-CM | POA: Diagnosis not present

## 2019-02-15 DIAGNOSIS — J9620 Acute and chronic respiratory failure, unspecified whether with hypoxia or hypercapnia: Secondary | ICD-10-CM | POA: Diagnosis not present

## 2019-02-15 DIAGNOSIS — N2581 Secondary hyperparathyroidism of renal origin: Secondary | ICD-10-CM | POA: Diagnosis not present

## 2019-02-15 DIAGNOSIS — R5381 Other malaise: Secondary | ICD-10-CM | POA: Diagnosis not present

## 2019-02-15 DIAGNOSIS — R1310 Dysphagia, unspecified: Secondary | ICD-10-CM | POA: Diagnosis not present

## 2019-02-15 DIAGNOSIS — M6281 Muscle weakness (generalized): Secondary | ICD-10-CM | POA: Diagnosis not present

## 2019-02-15 DIAGNOSIS — I120 Hypertensive chronic kidney disease with stage 5 chronic kidney disease or end stage renal disease: Secondary | ICD-10-CM | POA: Diagnosis not present

## 2019-02-15 DIAGNOSIS — Z992 Dependence on renal dialysis: Secondary | ICD-10-CM | POA: Diagnosis not present

## 2019-02-15 DIAGNOSIS — N186 End stage renal disease: Secondary | ICD-10-CM | POA: Diagnosis not present

## 2019-02-15 DIAGNOSIS — R262 Difficulty in walking, not elsewhere classified: Secondary | ICD-10-CM | POA: Diagnosis not present

## 2019-02-16 DIAGNOSIS — J9690 Respiratory failure, unspecified, unspecified whether with hypoxia or hypercapnia: Secondary | ICD-10-CM | POA: Diagnosis not present

## 2019-02-16 DIAGNOSIS — F419 Anxiety disorder, unspecified: Secondary | ICD-10-CM | POA: Diagnosis not present

## 2019-02-16 DIAGNOSIS — N2581 Secondary hyperparathyroidism of renal origin: Secondary | ICD-10-CM | POA: Diagnosis not present

## 2019-02-16 DIAGNOSIS — E1142 Type 2 diabetes mellitus with diabetic polyneuropathy: Secondary | ICD-10-CM | POA: Diagnosis not present

## 2019-02-16 DIAGNOSIS — Z992 Dependence on renal dialysis: Secondary | ICD-10-CM | POA: Diagnosis not present

## 2019-02-16 DIAGNOSIS — E119 Type 2 diabetes mellitus without complications: Secondary | ICD-10-CM | POA: Diagnosis not present

## 2019-02-16 DIAGNOSIS — I120 Hypertensive chronic kidney disease with stage 5 chronic kidney disease or end stage renal disease: Secondary | ICD-10-CM | POA: Diagnosis not present

## 2019-02-16 DIAGNOSIS — K9423 Gastrostomy malfunction: Secondary | ICD-10-CM | POA: Diagnosis not present

## 2019-02-16 DIAGNOSIS — G894 Chronic pain syndrome: Secondary | ICD-10-CM | POA: Diagnosis not present

## 2019-02-16 DIAGNOSIS — R1310 Dysphagia, unspecified: Secondary | ICD-10-CM | POA: Diagnosis not present

## 2019-02-16 DIAGNOSIS — N186 End stage renal disease: Secondary | ICD-10-CM | POA: Diagnosis not present

## 2019-02-16 DIAGNOSIS — J9620 Acute and chronic respiratory failure, unspecified whether with hypoxia or hypercapnia: Secondary | ICD-10-CM | POA: Diagnosis not present

## 2019-02-16 DIAGNOSIS — E1122 Type 2 diabetes mellitus with diabetic chronic kidney disease: Secondary | ICD-10-CM | POA: Diagnosis not present

## 2019-02-17 ENCOUNTER — Other Ambulatory Visit (HOSPITAL_COMMUNITY): Payer: Self-pay | Admitting: Nephrology

## 2019-02-17 DIAGNOSIS — J9620 Acute and chronic respiratory failure, unspecified whether with hypoxia or hypercapnia: Secondary | ICD-10-CM | POA: Diagnosis not present

## 2019-02-17 DIAGNOSIS — I120 Hypertensive chronic kidney disease with stage 5 chronic kidney disease or end stage renal disease: Secondary | ICD-10-CM | POA: Diagnosis not present

## 2019-02-17 DIAGNOSIS — N2581 Secondary hyperparathyroidism of renal origin: Secondary | ICD-10-CM | POA: Diagnosis not present

## 2019-02-17 DIAGNOSIS — Z992 Dependence on renal dialysis: Secondary | ICD-10-CM

## 2019-02-17 DIAGNOSIS — N186 End stage renal disease: Secondary | ICD-10-CM | POA: Diagnosis not present

## 2019-02-17 DIAGNOSIS — R1310 Dysphagia, unspecified: Secondary | ICD-10-CM | POA: Diagnosis not present

## 2019-02-17 DIAGNOSIS — E1122 Type 2 diabetes mellitus with diabetic chronic kidney disease: Secondary | ICD-10-CM | POA: Diagnosis not present

## 2019-02-18 DIAGNOSIS — I120 Hypertensive chronic kidney disease with stage 5 chronic kidney disease or end stage renal disease: Secondary | ICD-10-CM | POA: Diagnosis not present

## 2019-02-18 DIAGNOSIS — Z992 Dependence on renal dialysis: Secondary | ICD-10-CM | POA: Diagnosis not present

## 2019-02-18 DIAGNOSIS — E1122 Type 2 diabetes mellitus with diabetic chronic kidney disease: Secondary | ICD-10-CM | POA: Diagnosis not present

## 2019-02-18 DIAGNOSIS — N2581 Secondary hyperparathyroidism of renal origin: Secondary | ICD-10-CM | POA: Diagnosis not present

## 2019-02-18 DIAGNOSIS — N186 End stage renal disease: Secondary | ICD-10-CM | POA: Diagnosis not present

## 2019-02-20 DIAGNOSIS — F419 Anxiety disorder, unspecified: Secondary | ICD-10-CM | POA: Diagnosis not present

## 2019-02-20 DIAGNOSIS — J9621 Acute and chronic respiratory failure with hypoxia: Secondary | ICD-10-CM

## 2019-02-20 DIAGNOSIS — N2581 Secondary hyperparathyroidism of renal origin: Secondary | ICD-10-CM | POA: Diagnosis not present

## 2019-02-20 DIAGNOSIS — G894 Chronic pain syndrome: Secondary | ICD-10-CM | POA: Diagnosis not present

## 2019-02-20 DIAGNOSIS — I120 Hypertensive chronic kidney disease with stage 5 chronic kidney disease or end stage renal disease: Secondary | ICD-10-CM | POA: Diagnosis not present

## 2019-02-20 DIAGNOSIS — I5032 Chronic diastolic (congestive) heart failure: Secondary | ICD-10-CM

## 2019-02-20 DIAGNOSIS — K9423 Gastrostomy malfunction: Secondary | ICD-10-CM | POA: Diagnosis not present

## 2019-02-20 DIAGNOSIS — N186 End stage renal disease: Secondary | ICD-10-CM

## 2019-02-20 DIAGNOSIS — E119 Type 2 diabetes mellitus without complications: Secondary | ICD-10-CM | POA: Diagnosis not present

## 2019-02-20 DIAGNOSIS — J189 Pneumonia, unspecified organism: Secondary | ICD-10-CM | POA: Diagnosis not present

## 2019-02-20 DIAGNOSIS — E1122 Type 2 diabetes mellitus with diabetic chronic kidney disease: Secondary | ICD-10-CM | POA: Diagnosis not present

## 2019-02-20 DIAGNOSIS — J9690 Respiratory failure, unspecified, unspecified whether with hypoxia or hypercapnia: Secondary | ICD-10-CM | POA: Diagnosis not present

## 2019-02-20 DIAGNOSIS — Z992 Dependence on renal dialysis: Secondary | ICD-10-CM | POA: Diagnosis not present

## 2019-02-20 DIAGNOSIS — E1142 Type 2 diabetes mellitus with diabetic polyneuropathy: Secondary | ICD-10-CM | POA: Diagnosis not present

## 2019-02-21 DIAGNOSIS — E1142 Type 2 diabetes mellitus with diabetic polyneuropathy: Secondary | ICD-10-CM | POA: Diagnosis not present

## 2019-02-21 DIAGNOSIS — K9423 Gastrostomy malfunction: Secondary | ICD-10-CM | POA: Diagnosis not present

## 2019-02-21 DIAGNOSIS — E119 Type 2 diabetes mellitus without complications: Secondary | ICD-10-CM | POA: Diagnosis not present

## 2019-02-21 DIAGNOSIS — N186 End stage renal disease: Secondary | ICD-10-CM

## 2019-02-21 DIAGNOSIS — I5032 Chronic diastolic (congestive) heart failure: Secondary | ICD-10-CM

## 2019-02-21 DIAGNOSIS — F419 Anxiety disorder, unspecified: Secondary | ICD-10-CM | POA: Diagnosis not present

## 2019-02-21 DIAGNOSIS — J9690 Respiratory failure, unspecified, unspecified whether with hypoxia or hypercapnia: Secondary | ICD-10-CM | POA: Diagnosis not present

## 2019-02-21 DIAGNOSIS — G894 Chronic pain syndrome: Secondary | ICD-10-CM | POA: Diagnosis not present

## 2019-02-21 DIAGNOSIS — J9621 Acute and chronic respiratory failure with hypoxia: Secondary | ICD-10-CM

## 2019-02-21 DIAGNOSIS — J189 Pneumonia, unspecified organism: Secondary | ICD-10-CM

## 2019-02-22 ENCOUNTER — Other Ambulatory Visit: Payer: Self-pay | Admitting: Radiology

## 2019-02-22 DIAGNOSIS — N186 End stage renal disease: Secondary | ICD-10-CM | POA: Diagnosis not present

## 2019-02-22 DIAGNOSIS — I5032 Chronic diastolic (congestive) heart failure: Secondary | ICD-10-CM

## 2019-02-22 DIAGNOSIS — I120 Hypertensive chronic kidney disease with stage 5 chronic kidney disease or end stage renal disease: Secondary | ICD-10-CM | POA: Diagnosis not present

## 2019-02-22 DIAGNOSIS — Z992 Dependence on renal dialysis: Secondary | ICD-10-CM | POA: Diagnosis not present

## 2019-02-22 DIAGNOSIS — E1122 Type 2 diabetes mellitus with diabetic chronic kidney disease: Secondary | ICD-10-CM | POA: Diagnosis not present

## 2019-02-22 DIAGNOSIS — J9621 Acute and chronic respiratory failure with hypoxia: Secondary | ICD-10-CM

## 2019-02-22 DIAGNOSIS — N2581 Secondary hyperparathyroidism of renal origin: Secondary | ICD-10-CM | POA: Diagnosis not present

## 2019-02-22 DIAGNOSIS — J189 Pneumonia, unspecified organism: Secondary | ICD-10-CM

## 2019-02-23 ENCOUNTER — Encounter (HOSPITAL_COMMUNITY): Payer: Self-pay

## 2019-02-23 ENCOUNTER — Ambulatory Visit (HOSPITAL_COMMUNITY): Admission: RE | Admit: 2019-02-23 | Payer: Medicare HMO | Source: Ambulatory Visit

## 2019-02-23 DIAGNOSIS — E119 Type 2 diabetes mellitus without complications: Secondary | ICD-10-CM | POA: Diagnosis not present

## 2019-02-23 DIAGNOSIS — E1122 Type 2 diabetes mellitus with diabetic chronic kidney disease: Secondary | ICD-10-CM | POA: Diagnosis not present

## 2019-02-23 DIAGNOSIS — K9423 Gastrostomy malfunction: Secondary | ICD-10-CM | POA: Diagnosis not present

## 2019-02-23 DIAGNOSIS — I120 Hypertensive chronic kidney disease with stage 5 chronic kidney disease or end stage renal disease: Secondary | ICD-10-CM | POA: Diagnosis not present

## 2019-02-23 DIAGNOSIS — N186 End stage renal disease: Secondary | ICD-10-CM | POA: Diagnosis not present

## 2019-02-23 DIAGNOSIS — E1142 Type 2 diabetes mellitus with diabetic polyneuropathy: Secondary | ICD-10-CM | POA: Diagnosis not present

## 2019-02-23 DIAGNOSIS — J9621 Acute and chronic respiratory failure with hypoxia: Secondary | ICD-10-CM

## 2019-02-23 DIAGNOSIS — J9690 Respiratory failure, unspecified, unspecified whether with hypoxia or hypercapnia: Secondary | ICD-10-CM | POA: Diagnosis not present

## 2019-02-23 DIAGNOSIS — N2581 Secondary hyperparathyroidism of renal origin: Secondary | ICD-10-CM | POA: Diagnosis not present

## 2019-02-23 DIAGNOSIS — F419 Anxiety disorder, unspecified: Secondary | ICD-10-CM | POA: Diagnosis not present

## 2019-02-23 DIAGNOSIS — J189 Pneumonia, unspecified organism: Secondary | ICD-10-CM

## 2019-02-23 DIAGNOSIS — G894 Chronic pain syndrome: Secondary | ICD-10-CM | POA: Diagnosis not present

## 2019-02-23 DIAGNOSIS — I5032 Chronic diastolic (congestive) heart failure: Secondary | ICD-10-CM

## 2019-02-23 DIAGNOSIS — Z992 Dependence on renal dialysis: Secondary | ICD-10-CM | POA: Diagnosis not present

## 2019-02-24 DIAGNOSIS — N2581 Secondary hyperparathyroidism of renal origin: Secondary | ICD-10-CM | POA: Diagnosis not present

## 2019-02-24 DIAGNOSIS — R918 Other nonspecific abnormal finding of lung field: Secondary | ICD-10-CM | POA: Diagnosis not present

## 2019-02-24 DIAGNOSIS — Z992 Dependence on renal dialysis: Secondary | ICD-10-CM | POA: Diagnosis not present

## 2019-02-24 DIAGNOSIS — I120 Hypertensive chronic kidney disease with stage 5 chronic kidney disease or end stage renal disease: Secondary | ICD-10-CM | POA: Diagnosis not present

## 2019-02-24 DIAGNOSIS — N186 End stage renal disease: Secondary | ICD-10-CM

## 2019-02-24 DIAGNOSIS — I5032 Chronic diastolic (congestive) heart failure: Secondary | ICD-10-CM

## 2019-02-24 DIAGNOSIS — E1122 Type 2 diabetes mellitus with diabetic chronic kidney disease: Secondary | ICD-10-CM | POA: Diagnosis not present

## 2019-02-24 DIAGNOSIS — J9621 Acute and chronic respiratory failure with hypoxia: Secondary | ICD-10-CM

## 2019-02-24 DIAGNOSIS — J189 Pneumonia, unspecified organism: Secondary | ICD-10-CM

## 2019-02-24 DIAGNOSIS — Z430 Encounter for attention to tracheostomy: Secondary | ICD-10-CM | POA: Diagnosis not present

## 2019-02-25 DIAGNOSIS — J9621 Acute and chronic respiratory failure with hypoxia: Secondary | ICD-10-CM

## 2019-02-25 DIAGNOSIS — I5032 Chronic diastolic (congestive) heart failure: Secondary | ICD-10-CM

## 2019-02-25 DIAGNOSIS — N186 End stage renal disease: Secondary | ICD-10-CM

## 2019-02-25 DIAGNOSIS — J189 Pneumonia, unspecified organism: Secondary | ICD-10-CM

## 2019-02-26 DIAGNOSIS — N186 End stage renal disease: Secondary | ICD-10-CM

## 2019-02-26 DIAGNOSIS — J9621 Acute and chronic respiratory failure with hypoxia: Secondary | ICD-10-CM

## 2019-02-26 DIAGNOSIS — I5032 Chronic diastolic (congestive) heart failure: Secondary | ICD-10-CM

## 2019-02-26 DIAGNOSIS — J189 Pneumonia, unspecified organism: Secondary | ICD-10-CM

## 2019-02-27 DIAGNOSIS — J9690 Respiratory failure, unspecified, unspecified whether with hypoxia or hypercapnia: Secondary | ICD-10-CM | POA: Diagnosis not present

## 2019-02-27 DIAGNOSIS — E119 Type 2 diabetes mellitus without complications: Secondary | ICD-10-CM | POA: Diagnosis not present

## 2019-02-27 DIAGNOSIS — N2581 Secondary hyperparathyroidism of renal origin: Secondary | ICD-10-CM | POA: Diagnosis not present

## 2019-02-27 DIAGNOSIS — N186 End stage renal disease: Secondary | ICD-10-CM | POA: Diagnosis not present

## 2019-02-27 DIAGNOSIS — I120 Hypertensive chronic kidney disease with stage 5 chronic kidney disease or end stage renal disease: Secondary | ICD-10-CM | POA: Diagnosis not present

## 2019-02-27 DIAGNOSIS — K9423 Gastrostomy malfunction: Secondary | ICD-10-CM | POA: Diagnosis not present

## 2019-02-27 DIAGNOSIS — E1142 Type 2 diabetes mellitus with diabetic polyneuropathy: Secondary | ICD-10-CM | POA: Diagnosis not present

## 2019-02-27 DIAGNOSIS — G894 Chronic pain syndrome: Secondary | ICD-10-CM | POA: Diagnosis not present

## 2019-02-27 DIAGNOSIS — F419 Anxiety disorder, unspecified: Secondary | ICD-10-CM | POA: Diagnosis not present

## 2019-02-27 DIAGNOSIS — E1122 Type 2 diabetes mellitus with diabetic chronic kidney disease: Secondary | ICD-10-CM | POA: Diagnosis not present

## 2019-02-27 DIAGNOSIS — Z992 Dependence on renal dialysis: Secondary | ICD-10-CM | POA: Diagnosis not present

## 2019-02-28 DIAGNOSIS — Z992 Dependence on renal dialysis: Secondary | ICD-10-CM | POA: Diagnosis not present

## 2019-02-28 DIAGNOSIS — I120 Hypertensive chronic kidney disease with stage 5 chronic kidney disease or end stage renal disease: Secondary | ICD-10-CM | POA: Diagnosis not present

## 2019-02-28 DIAGNOSIS — J811 Chronic pulmonary edema: Secondary | ICD-10-CM | POA: Diagnosis not present

## 2019-02-28 DIAGNOSIS — E1122 Type 2 diabetes mellitus with diabetic chronic kidney disease: Secondary | ICD-10-CM | POA: Diagnosis not present

## 2019-02-28 DIAGNOSIS — N2581 Secondary hyperparathyroidism of renal origin: Secondary | ICD-10-CM | POA: Diagnosis not present

## 2019-02-28 DIAGNOSIS — Z452 Encounter for adjustment and management of vascular access device: Secondary | ICD-10-CM | POA: Diagnosis not present

## 2019-02-28 DIAGNOSIS — N186 End stage renal disease: Secondary | ICD-10-CM | POA: Diagnosis not present

## 2019-03-01 DIAGNOSIS — N186 End stage renal disease: Secondary | ICD-10-CM | POA: Diagnosis not present

## 2019-03-01 DIAGNOSIS — Z992 Dependence on renal dialysis: Secondary | ICD-10-CM | POA: Diagnosis not present

## 2019-03-01 DIAGNOSIS — I120 Hypertensive chronic kidney disease with stage 5 chronic kidney disease or end stage renal disease: Secondary | ICD-10-CM | POA: Diagnosis not present

## 2019-03-01 DIAGNOSIS — E1122 Type 2 diabetes mellitus with diabetic chronic kidney disease: Secondary | ICD-10-CM | POA: Diagnosis not present

## 2019-03-01 DIAGNOSIS — N2581 Secondary hyperparathyroidism of renal origin: Secondary | ICD-10-CM | POA: Diagnosis not present

## 2019-03-02 DIAGNOSIS — Z992 Dependence on renal dialysis: Secondary | ICD-10-CM | POA: Diagnosis not present

## 2019-03-02 DIAGNOSIS — N186 End stage renal disease: Secondary | ICD-10-CM | POA: Diagnosis not present

## 2019-03-02 DIAGNOSIS — N2581 Secondary hyperparathyroidism of renal origin: Secondary | ICD-10-CM | POA: Diagnosis not present

## 2019-03-02 DIAGNOSIS — E1122 Type 2 diabetes mellitus with diabetic chronic kidney disease: Secondary | ICD-10-CM | POA: Diagnosis not present

## 2019-03-02 DIAGNOSIS — I120 Hypertensive chronic kidney disease with stage 5 chronic kidney disease or end stage renal disease: Secondary | ICD-10-CM | POA: Diagnosis not present

## 2019-03-03 DIAGNOSIS — I120 Hypertensive chronic kidney disease with stage 5 chronic kidney disease or end stage renal disease: Secondary | ICD-10-CM | POA: Diagnosis not present

## 2019-03-03 DIAGNOSIS — N2581 Secondary hyperparathyroidism of renal origin: Secondary | ICD-10-CM | POA: Diagnosis not present

## 2019-03-03 DIAGNOSIS — Z992 Dependence on renal dialysis: Secondary | ICD-10-CM | POA: Diagnosis not present

## 2019-03-03 DIAGNOSIS — J9690 Respiratory failure, unspecified, unspecified whether with hypoxia or hypercapnia: Secondary | ICD-10-CM | POA: Diagnosis not present

## 2019-03-03 DIAGNOSIS — E1122 Type 2 diabetes mellitus with diabetic chronic kidney disease: Secondary | ICD-10-CM | POA: Diagnosis not present

## 2019-03-03 DIAGNOSIS — E1142 Type 2 diabetes mellitus with diabetic polyneuropathy: Secondary | ICD-10-CM | POA: Diagnosis not present

## 2019-03-03 DIAGNOSIS — F419 Anxiety disorder, unspecified: Secondary | ICD-10-CM | POA: Diagnosis not present

## 2019-03-03 DIAGNOSIS — G894 Chronic pain syndrome: Secondary | ICD-10-CM | POA: Diagnosis not present

## 2019-03-03 DIAGNOSIS — E119 Type 2 diabetes mellitus without complications: Secondary | ICD-10-CM | POA: Diagnosis not present

## 2019-03-03 DIAGNOSIS — K9423 Gastrostomy malfunction: Secondary | ICD-10-CM | POA: Diagnosis not present

## 2019-03-03 DIAGNOSIS — N186 End stage renal disease: Secondary | ICD-10-CM | POA: Diagnosis not present

## 2019-03-05 MED ORDER — ATORVASTATIN CALCIUM 40 MG PO TABS: 40.0000 mg | ORAL_TABLET | Freq: Every day | ORAL | 3 refills | Status: AC

## 2019-03-05 NOTE — Addendum Note (Signed)
Addended by: Forde Dandy on: 03/05/2019 09:01 AM   Modules accepted: Orders

## 2019-03-06 DIAGNOSIS — Z992 Dependence on renal dialysis: Secondary | ICD-10-CM | POA: Diagnosis not present

## 2019-03-06 DIAGNOSIS — E119 Type 2 diabetes mellitus without complications: Secondary | ICD-10-CM | POA: Diagnosis not present

## 2019-03-06 DIAGNOSIS — I120 Hypertensive chronic kidney disease with stage 5 chronic kidney disease or end stage renal disease: Secondary | ICD-10-CM | POA: Diagnosis not present

## 2019-03-06 DIAGNOSIS — F419 Anxiety disorder, unspecified: Secondary | ICD-10-CM | POA: Diagnosis not present

## 2019-03-06 DIAGNOSIS — E1142 Type 2 diabetes mellitus with diabetic polyneuropathy: Secondary | ICD-10-CM | POA: Diagnosis not present

## 2019-03-06 DIAGNOSIS — K9423 Gastrostomy malfunction: Secondary | ICD-10-CM | POA: Diagnosis not present

## 2019-03-06 DIAGNOSIS — J189 Pneumonia, unspecified organism: Secondary | ICD-10-CM

## 2019-03-06 DIAGNOSIS — N2581 Secondary hyperparathyroidism of renal origin: Secondary | ICD-10-CM | POA: Diagnosis not present

## 2019-03-06 DIAGNOSIS — G894 Chronic pain syndrome: Secondary | ICD-10-CM | POA: Diagnosis not present

## 2019-03-06 DIAGNOSIS — J9621 Acute and chronic respiratory failure with hypoxia: Secondary | ICD-10-CM

## 2019-03-06 DIAGNOSIS — N186 End stage renal disease: Secondary | ICD-10-CM

## 2019-03-06 DIAGNOSIS — E1122 Type 2 diabetes mellitus with diabetic chronic kidney disease: Secondary | ICD-10-CM | POA: Diagnosis not present

## 2019-03-06 DIAGNOSIS — J9690 Respiratory failure, unspecified, unspecified whether with hypoxia or hypercapnia: Secondary | ICD-10-CM | POA: Diagnosis not present

## 2019-03-06 DIAGNOSIS — I5032 Chronic diastolic (congestive) heart failure: Secondary | ICD-10-CM

## 2019-03-07 ENCOUNTER — Other Ambulatory Visit (HOSPITAL_COMMUNITY): Payer: Self-pay | Admitting: Internal Medicine

## 2019-03-07 DIAGNOSIS — J9621 Acute and chronic respiratory failure with hypoxia: Secondary | ICD-10-CM

## 2019-03-07 DIAGNOSIS — Z452 Encounter for adjustment and management of vascular access device: Secondary | ICD-10-CM | POA: Diagnosis not present

## 2019-03-07 DIAGNOSIS — N186 End stage renal disease: Secondary | ICD-10-CM

## 2019-03-07 DIAGNOSIS — N2581 Secondary hyperparathyroidism of renal origin: Secondary | ICD-10-CM | POA: Diagnosis not present

## 2019-03-07 DIAGNOSIS — I5032 Chronic diastolic (congestive) heart failure: Secondary | ICD-10-CM

## 2019-03-07 DIAGNOSIS — Z992 Dependence on renal dialysis: Secondary | ICD-10-CM | POA: Diagnosis not present

## 2019-03-07 DIAGNOSIS — R918 Other nonspecific abnormal finding of lung field: Secondary | ICD-10-CM | POA: Diagnosis not present

## 2019-03-07 DIAGNOSIS — E1122 Type 2 diabetes mellitus with diabetic chronic kidney disease: Secondary | ICD-10-CM | POA: Diagnosis not present

## 2019-03-07 DIAGNOSIS — J189 Pneumonia, unspecified organism: Secondary | ICD-10-CM

## 2019-03-07 DIAGNOSIS — I120 Hypertensive chronic kidney disease with stage 5 chronic kidney disease or end stage renal disease: Secondary | ICD-10-CM | POA: Diagnosis not present

## 2019-03-08 ENCOUNTER — Other Ambulatory Visit: Payer: Self-pay | Admitting: Physician Assistant

## 2019-03-08 DIAGNOSIS — I5032 Chronic diastolic (congestive) heart failure: Secondary | ICD-10-CM

## 2019-03-08 DIAGNOSIS — J9621 Acute and chronic respiratory failure with hypoxia: Secondary | ICD-10-CM

## 2019-03-08 DIAGNOSIS — N186 End stage renal disease: Secondary | ICD-10-CM

## 2019-03-08 DIAGNOSIS — J189 Pneumonia, unspecified organism: Secondary | ICD-10-CM

## 2019-03-09 ENCOUNTER — Ambulatory Visit (HOSPITAL_COMMUNITY)
Admission: RE | Admit: 2019-03-09 | Discharge: 2019-03-09 | Disposition: A | Discharge status: Home / Self Care | Payer: Medicare HMO | Source: Ambulatory Visit | Attending: Internal Medicine | Admitting: Internal Medicine

## 2019-03-09 ENCOUNTER — Encounter (HOSPITAL_COMMUNITY): Payer: Self-pay

## 2019-03-09 ENCOUNTER — Other Ambulatory Visit: Payer: Self-pay

## 2019-03-09 DIAGNOSIS — N186 End stage renal disease: Secondary | ICD-10-CM

## 2019-03-09 MED ORDER — IOHEXOL 300 MG/ML  SOLN: 100.0000 mL | Freq: Once | INTRAMUSCULAR | Status: DC | PRN

## 2019-03-09 MED ORDER — ALBUTEROL SULFATE (2.5 MG/3ML) 0.083% IN NEBU: 2.5000 mg | INHALATION_SOLUTION | Freq: Once | RESPIRATORY_TRACT | Status: DC

## 2019-03-09 NOTE — Sedation Documentation (Signed)
Patient unable to receive breathing treatment due to SOB, coughing, and no recent Covid test. Patient to be sent back to [facility] via [facility]. No intervention able to be preformed.

## 2019-03-09 NOTE — Sedation Documentation (Signed)
Patient feels nauseous, SOB and coughing a small amount of tan sputum. Oxygen turned up, patient encouraged to cough and deep breath. MD and PA notified of situation for further orders.

## 2019-03-09 NOTE — Sedation Documentation (Signed)
Patient placed on monitor and noticed to be in bradycardia and hypotensive. MD and PA notified.

## 2019-03-10 DIAGNOSIS — N186 End stage renal disease: Secondary | ICD-10-CM

## 2019-03-10 DIAGNOSIS — J9621 Acute and chronic respiratory failure with hypoxia: Secondary | ICD-10-CM

## 2019-03-10 DIAGNOSIS — J189 Pneumonia, unspecified organism: Secondary | ICD-10-CM

## 2019-03-10 DIAGNOSIS — I5032 Chronic diastolic (congestive) heart failure: Secondary | ICD-10-CM

## 2019-03-11 DIAGNOSIS — J9621 Acute and chronic respiratory failure with hypoxia: Secondary | ICD-10-CM

## 2019-03-11 DIAGNOSIS — I5032 Chronic diastolic (congestive) heart failure: Secondary | ICD-10-CM

## 2019-03-11 DIAGNOSIS — N186 End stage renal disease: Secondary | ICD-10-CM

## 2019-03-11 DIAGNOSIS — J189 Pneumonia, unspecified organism: Secondary | ICD-10-CM

## 2019-03-12 DIAGNOSIS — J189 Pneumonia, unspecified organism: Secondary | ICD-10-CM

## 2019-03-12 DIAGNOSIS — N186 End stage renal disease: Secondary | ICD-10-CM

## 2019-03-12 DIAGNOSIS — I5032 Chronic diastolic (congestive) heart failure: Secondary | ICD-10-CM

## 2019-03-12 DIAGNOSIS — J9621 Acute and chronic respiratory failure with hypoxia: Secondary | ICD-10-CM

## 2019-03-20 DIAGNOSIS — J189 Pneumonia, unspecified organism: Secondary | ICD-10-CM

## 2019-03-20 DIAGNOSIS — I5032 Chronic diastolic (congestive) heart failure: Secondary | ICD-10-CM

## 2019-03-20 DIAGNOSIS — J9621 Acute and chronic respiratory failure with hypoxia: Secondary | ICD-10-CM

## 2019-03-20 DIAGNOSIS — N186 End stage renal disease: Secondary | ICD-10-CM

## 2019-03-21 DIAGNOSIS — I5032 Chronic diastolic (congestive) heart failure: Secondary | ICD-10-CM

## 2019-03-21 DIAGNOSIS — N186 End stage renal disease: Secondary | ICD-10-CM

## 2019-03-21 DIAGNOSIS — J9621 Acute and chronic respiratory failure with hypoxia: Secondary | ICD-10-CM

## 2019-03-21 DIAGNOSIS — J189 Pneumonia, unspecified organism: Secondary | ICD-10-CM

## 2019-03-22 DIAGNOSIS — J189 Pneumonia, unspecified organism: Secondary | ICD-10-CM

## 2019-03-22 DIAGNOSIS — I5032 Chronic diastolic (congestive) heart failure: Secondary | ICD-10-CM

## 2019-03-22 DIAGNOSIS — J9621 Acute and chronic respiratory failure with hypoxia: Secondary | ICD-10-CM

## 2019-03-22 DIAGNOSIS — N186 End stage renal disease: Secondary | ICD-10-CM

## 2019-03-23 DIAGNOSIS — J9621 Acute and chronic respiratory failure with hypoxia: Secondary | ICD-10-CM

## 2019-03-23 DIAGNOSIS — J189 Pneumonia, unspecified organism: Secondary | ICD-10-CM

## 2019-03-23 DIAGNOSIS — N186 End stage renal disease: Secondary | ICD-10-CM

## 2019-03-23 DIAGNOSIS — I5032 Chronic diastolic (congestive) heart failure: Secondary | ICD-10-CM

## 2019-03-24 DIAGNOSIS — I5032 Chronic diastolic (congestive) heart failure: Secondary | ICD-10-CM

## 2019-03-24 DIAGNOSIS — J189 Pneumonia, unspecified organism: Secondary | ICD-10-CM

## 2019-03-24 DIAGNOSIS — J9621 Acute and chronic respiratory failure with hypoxia: Secondary | ICD-10-CM

## 2019-03-24 DIAGNOSIS — N186 End stage renal disease: Secondary | ICD-10-CM

## 2019-03-25 DIAGNOSIS — J9621 Acute and chronic respiratory failure with hypoxia: Secondary | ICD-10-CM

## 2019-03-25 DIAGNOSIS — I5032 Chronic diastolic (congestive) heart failure: Secondary | ICD-10-CM

## 2019-03-25 DIAGNOSIS — N186 End stage renal disease: Secondary | ICD-10-CM

## 2019-03-25 DIAGNOSIS — J189 Pneumonia, unspecified organism: Secondary | ICD-10-CM

## 2019-03-26 DIAGNOSIS — J9621 Acute and chronic respiratory failure with hypoxia: Secondary | ICD-10-CM

## 2019-03-26 DIAGNOSIS — J189 Pneumonia, unspecified organism: Secondary | ICD-10-CM

## 2019-03-26 DIAGNOSIS — N186 End stage renal disease: Secondary | ICD-10-CM

## 2019-03-26 DIAGNOSIS — I5032 Chronic diastolic (congestive) heart failure: Secondary | ICD-10-CM

## 2019-03-28 DIAGNOSIS — N186 End stage renal disease: Secondary | ICD-10-CM | POA: Diagnosis not present

## 2019-04-03 DIAGNOSIS — N186 End stage renal disease: Secondary | ICD-10-CM

## 2019-04-03 DIAGNOSIS — I5032 Chronic diastolic (congestive) heart failure: Secondary | ICD-10-CM

## 2019-04-03 DIAGNOSIS — J9621 Acute and chronic respiratory failure with hypoxia: Secondary | ICD-10-CM

## 2019-04-03 DIAGNOSIS — J189 Pneumonia, unspecified organism: Secondary | ICD-10-CM

## 2019-04-04 DIAGNOSIS — N186 End stage renal disease: Secondary | ICD-10-CM

## 2019-04-04 DIAGNOSIS — J9621 Acute and chronic respiratory failure with hypoxia: Secondary | ICD-10-CM

## 2019-04-04 DIAGNOSIS — J189 Pneumonia, unspecified organism: Secondary | ICD-10-CM

## 2019-04-04 DIAGNOSIS — I5032 Chronic diastolic (congestive) heart failure: Secondary | ICD-10-CM

## 2019-04-05 DIAGNOSIS — N186 End stage renal disease: Secondary | ICD-10-CM

## 2019-04-05 DIAGNOSIS — J9621 Acute and chronic respiratory failure with hypoxia: Secondary | ICD-10-CM

## 2019-04-05 DIAGNOSIS — I5032 Chronic diastolic (congestive) heart failure: Secondary | ICD-10-CM

## 2019-04-05 DIAGNOSIS — J189 Pneumonia, unspecified organism: Secondary | ICD-10-CM

## 2019-04-06 DIAGNOSIS — J9621 Acute and chronic respiratory failure with hypoxia: Secondary | ICD-10-CM

## 2019-04-06 DIAGNOSIS — J189 Pneumonia, unspecified organism: Secondary | ICD-10-CM

## 2019-04-06 DIAGNOSIS — I5032 Chronic diastolic (congestive) heart failure: Secondary | ICD-10-CM

## 2019-04-06 DIAGNOSIS — N186 End stage renal disease: Secondary | ICD-10-CM

## 2019-04-07 DIAGNOSIS — N186 End stage renal disease: Secondary | ICD-10-CM

## 2019-04-07 DIAGNOSIS — J9621 Acute and chronic respiratory failure with hypoxia: Secondary | ICD-10-CM

## 2019-04-07 DIAGNOSIS — J189 Pneumonia, unspecified organism: Secondary | ICD-10-CM

## 2019-04-07 DIAGNOSIS — I5032 Chronic diastolic (congestive) heart failure: Secondary | ICD-10-CM

## 2019-04-08 DIAGNOSIS — J9621 Acute and chronic respiratory failure with hypoxia: Secondary | ICD-10-CM

## 2019-04-08 DIAGNOSIS — I5032 Chronic diastolic (congestive) heart failure: Secondary | ICD-10-CM

## 2019-04-08 DIAGNOSIS — J189 Pneumonia, unspecified organism: Secondary | ICD-10-CM

## 2019-04-08 DIAGNOSIS — N186 End stage renal disease: Secondary | ICD-10-CM

## 2019-04-09 DIAGNOSIS — J9621 Acute and chronic respiratory failure with hypoxia: Secondary | ICD-10-CM

## 2019-04-09 DIAGNOSIS — I5032 Chronic diastolic (congestive) heart failure: Secondary | ICD-10-CM

## 2019-04-09 DIAGNOSIS — N186 End stage renal disease: Secondary | ICD-10-CM

## 2019-04-09 DIAGNOSIS — J189 Pneumonia, unspecified organism: Secondary | ICD-10-CM

## 2019-04-11 DIAGNOSIS — I469 Cardiac arrest, cause unspecified: Secondary | ICD-10-CM | POA: Diagnosis not present

## 2019-04-11 DIAGNOSIS — J90 Pleural effusion, not elsewhere classified: Secondary | ICD-10-CM | POA: Diagnosis not present

## 2019-04-11 DIAGNOSIS — F339 Major depressive disorder, recurrent, unspecified: Secondary | ICD-10-CM | POA: Diagnosis not present

## 2019-04-11 DIAGNOSIS — J9620 Acute and chronic respiratory failure, unspecified whether with hypoxia or hypercapnia: Secondary | ICD-10-CM | POA: Diagnosis not present

## 2019-04-11 DIAGNOSIS — Z992 Dependence on renal dialysis: Secondary | ICD-10-CM | POA: Diagnosis not present

## 2019-04-11 DIAGNOSIS — I132 Hypertensive heart and chronic kidney disease with heart failure and with stage 5 chronic kidney disease, or end stage renal disease: Secondary | ICD-10-CM | POA: Diagnosis not present

## 2019-04-11 DIAGNOSIS — R4182 Altered mental status, unspecified: Secondary | ICD-10-CM | POA: Diagnosis not present

## 2019-04-11 DIAGNOSIS — N2581 Secondary hyperparathyroidism of renal origin: Secondary | ICD-10-CM | POA: Diagnosis not present

## 2019-04-11 DIAGNOSIS — D509 Iron deficiency anemia, unspecified: Secondary | ICD-10-CM | POA: Diagnosis not present

## 2019-04-11 DIAGNOSIS — Z930 Tracheostomy status: Secondary | ICD-10-CM | POA: Diagnosis not present

## 2019-04-11 DIAGNOSIS — R1310 Dysphagia, unspecified: Secondary | ICD-10-CM | POA: Diagnosis not present

## 2019-04-11 DIAGNOSIS — R601 Generalized edema: Secondary | ICD-10-CM | POA: Diagnosis not present

## 2019-04-11 DIAGNOSIS — R079 Chest pain, unspecified: Secondary | ICD-10-CM | POA: Diagnosis not present

## 2019-04-11 DIAGNOSIS — Z931 Gastrostomy status: Secondary | ICD-10-CM | POA: Diagnosis not present

## 2019-04-11 DIAGNOSIS — E785 Hyperlipidemia, unspecified: Secondary | ICD-10-CM | POA: Diagnosis not present

## 2019-04-11 DIAGNOSIS — G4733 Obstructive sleep apnea (adult) (pediatric): Secondary | ICD-10-CM | POA: Diagnosis not present

## 2019-04-11 DIAGNOSIS — I442 Atrioventricular block, complete: Secondary | ICD-10-CM | POA: Diagnosis not present

## 2019-04-11 DIAGNOSIS — R570 Cardiogenic shock: Secondary | ICD-10-CM | POA: Diagnosis not present

## 2019-04-11 DIAGNOSIS — Z9911 Dependence on respirator [ventilator] status: Secondary | ICD-10-CM | POA: Diagnosis not present

## 2019-04-11 DIAGNOSIS — D631 Anemia in chronic kidney disease: Secondary | ICD-10-CM | POA: Diagnosis not present

## 2019-04-11 DIAGNOSIS — R000 Tachycardia, unspecified: Secondary | ICD-10-CM | POA: Diagnosis not present

## 2019-04-11 DIAGNOSIS — M6250 Muscle wasting and atrophy, not elsewhere classified, unspecified site: Secondary | ICD-10-CM | POA: Diagnosis not present

## 2019-04-11 DIAGNOSIS — I499 Cardiac arrhythmia, unspecified: Secondary | ICD-10-CM | POA: Diagnosis not present

## 2019-04-11 DIAGNOSIS — J9610 Chronic respiratory failure, unspecified whether with hypoxia or hypercapnia: Secondary | ICD-10-CM | POA: Diagnosis not present

## 2019-04-11 DIAGNOSIS — D649 Anemia, unspecified: Secondary | ICD-10-CM | POA: Diagnosis not present

## 2019-04-11 DIAGNOSIS — Z6841 Body Mass Index (BMI) 40.0 and over, adult: Secondary | ICD-10-CM | POA: Diagnosis not present

## 2019-04-11 DIAGNOSIS — N186 End stage renal disease: Secondary | ICD-10-CM | POA: Diagnosis not present

## 2019-04-11 DIAGNOSIS — K219 Gastro-esophageal reflux disease without esophagitis: Secondary | ICD-10-CM | POA: Diagnosis not present

## 2019-04-11 DIAGNOSIS — R0602 Shortness of breath: Secondary | ICD-10-CM | POA: Diagnosis not present

## 2019-04-11 DIAGNOSIS — R001 Bradycardia, unspecified: Secondary | ICD-10-CM | POA: Diagnosis not present

## 2019-04-11 DIAGNOSIS — J9621 Acute and chronic respiratory failure with hypoxia: Secondary | ICD-10-CM | POA: Diagnosis not present

## 2019-04-11 DIAGNOSIS — Z9981 Dependence on supplemental oxygen: Secondary | ICD-10-CM | POA: Diagnosis not present

## 2019-04-11 DIAGNOSIS — I10 Essential (primary) hypertension: Secondary | ICD-10-CM | POA: Diagnosis not present

## 2019-04-11 DIAGNOSIS — Z20828 Contact with and (suspected) exposure to other viral communicable diseases: Secondary | ICD-10-CM | POA: Diagnosis not present

## 2019-04-11 DIAGNOSIS — E1140 Type 2 diabetes mellitus with diabetic neuropathy, unspecified: Secondary | ICD-10-CM | POA: Diagnosis not present

## 2019-04-11 DIAGNOSIS — I4891 Unspecified atrial fibrillation: Secondary | ICD-10-CM | POA: Diagnosis not present

## 2019-04-11 DIAGNOSIS — G9389 Other specified disorders of brain: Secondary | ICD-10-CM | POA: Diagnosis not present

## 2019-04-11 DIAGNOSIS — I509 Heart failure, unspecified: Secondary | ICD-10-CM | POA: Diagnosis not present

## 2019-04-11 DIAGNOSIS — I959 Hypotension, unspecified: Secondary | ICD-10-CM | POA: Diagnosis not present

## 2019-04-11 DIAGNOSIS — I5032 Chronic diastolic (congestive) heart failure: Secondary | ICD-10-CM | POA: Diagnosis not present

## 2019-04-11 DIAGNOSIS — N390 Urinary tract infection, site not specified: Secondary | ICD-10-CM | POA: Diagnosis not present

## 2019-04-11 DIAGNOSIS — I82C11 Acute embolism and thrombosis of right internal jugular vein: Secondary | ICD-10-CM | POA: Diagnosis not present

## 2019-04-11 DIAGNOSIS — I5030 Unspecified diastolic (congestive) heart failure: Secondary | ICD-10-CM | POA: Diagnosis not present

## 2019-04-12 ENCOUNTER — Other Ambulatory Visit: Payer: Self-pay

## 2019-04-12 DIAGNOSIS — D631 Anemia in chronic kidney disease: Secondary | ICD-10-CM | POA: Diagnosis not present

## 2019-04-12 DIAGNOSIS — N2581 Secondary hyperparathyroidism of renal origin: Secondary | ICD-10-CM | POA: Diagnosis not present

## 2019-04-12 DIAGNOSIS — N186 End stage renal disease: Secondary | ICD-10-CM | POA: Diagnosis not present

## 2019-04-12 DIAGNOSIS — E785 Hyperlipidemia, unspecified: Secondary | ICD-10-CM | POA: Diagnosis not present

## 2019-04-12 DIAGNOSIS — J9610 Chronic respiratory failure, unspecified whether with hypoxia or hypercapnia: Secondary | ICD-10-CM | POA: Diagnosis not present

## 2019-04-12 DIAGNOSIS — Z992 Dependence on renal dialysis: Secondary | ICD-10-CM | POA: Diagnosis not present

## 2019-04-12 DIAGNOSIS — D509 Iron deficiency anemia, unspecified: Secondary | ICD-10-CM | POA: Diagnosis not present

## 2019-04-12 NOTE — Patient Outreach (Signed)
[facility]) Care Management  04/12/2019  Kathleen [name] 07-04-1950 [id number]     Transition of Care Referral  Referral Date: 04/12/2019 Referral Source: [facility] Discharge Report Date of Discharge: 04/11/2019 Facility: [facility] Insurance: [employer] Medicare and Medicaid    Outreach attempt # 1. No answer at present and voicemail box full.    Plan: RN CM will make outreach attempt within 3-4 business days. RN CM will send unsuccessful outreach letter.   Enzo Montgomery, RN,BSN,CCM [place] Management Telephonic Care Management Coordinator Direct Phone: [phone] Toll Free: [phone] Fax: [phone]

## 2019-04-13 ENCOUNTER — Other Ambulatory Visit: Payer: Self-pay

## 2019-04-13 DIAGNOSIS — Z930 Tracheostomy status: Secondary | ICD-10-CM | POA: Diagnosis not present

## 2019-04-13 DIAGNOSIS — I5030 Unspecified diastolic (congestive) heart failure: Secondary | ICD-10-CM | POA: Diagnosis not present

## 2019-04-13 DIAGNOSIS — I10 Essential (primary) hypertension: Secondary | ICD-10-CM | POA: Diagnosis not present

## 2019-04-13 DIAGNOSIS — N186 End stage renal disease: Secondary | ICD-10-CM | POA: Diagnosis not present

## 2019-04-13 DIAGNOSIS — J9610 Chronic respiratory failure, unspecified whether with hypoxia or hypercapnia: Secondary | ICD-10-CM | POA: Diagnosis not present

## 2019-04-13 DIAGNOSIS — D631 Anemia in chronic kidney disease: Secondary | ICD-10-CM | POA: Diagnosis not present

## 2019-04-13 NOTE — Patient Outreach (Signed)
[place] [facility]) Care Management  04/13/2019  CAMBRYN DOCK [birth year] [id number]   Transition of Care Referral  Referral Date: 04/12/2019 Referral Source: [facility] Discharge Report Date of Discharge: 04/11/2019 Facility[facility] Insurance: [employer] Medicare and Medicaid    Outreach attempt #2 to patient. Call went straight to voicemail box and mailbox full.    Plan: RN CM will make outreach attempt to patient within 3-4 business days.   Enzo Montgomery, RN,BSN,CCM [place] Management Telephonic Care Management Coordinator Direct Phone: [phone] Toll Free: [phone] Fax: [phone]

## 2019-04-14 ENCOUNTER — Other Ambulatory Visit: Payer: Self-pay

## 2019-04-14 DIAGNOSIS — D631 Anemia in chronic kidney disease: Secondary | ICD-10-CM | POA: Diagnosis not present

## 2019-04-14 DIAGNOSIS — J9610 Chronic respiratory failure, unspecified whether with hypoxia or hypercapnia: Secondary | ICD-10-CM | POA: Diagnosis not present

## 2019-04-14 DIAGNOSIS — E785 Hyperlipidemia, unspecified: Secondary | ICD-10-CM | POA: Diagnosis not present

## 2019-04-14 DIAGNOSIS — Z992 Dependence on renal dialysis: Secondary | ICD-10-CM | POA: Diagnosis not present

## 2019-04-14 DIAGNOSIS — N186 End stage renal disease: Secondary | ICD-10-CM | POA: Diagnosis not present

## 2019-04-14 DIAGNOSIS — N2581 Secondary hyperparathyroidism of renal origin: Secondary | ICD-10-CM | POA: Diagnosis not present

## 2019-04-14 DIAGNOSIS — D509 Iron deficiency anemia, unspecified: Secondary | ICD-10-CM | POA: Diagnosis not present

## 2019-04-14 NOTE — Patient Outreach (Signed)
[facility]) Care Management  04/14/2019  Kathleen [name] [birth year] [id number]   Transition of Care Referral  Referral Date:04/12/2019 Referral Source:[employer] Discharge Report Date of Discharge:04/11/2019 Facility[facility] Insurance:[employer] Medicare and Medicaid   Outreach attempt #3. Call went straight to voicemail box and mailbox remains full.     Plan: RN CM will close case if no response from letter mailed to patient.   Enzo Montgomery, RN,BSN,CCM [place] Management Telephonic Care Management Coordinator Direct Phone: [phone] Toll Free: [phone] Fax: [phone]

## 2019-04-16 DIAGNOSIS — N390 Urinary tract infection, site not specified: Secondary | ICD-10-CM | POA: Diagnosis not present

## 2019-04-16 DIAGNOSIS — I5032 Chronic diastolic (congestive) heart failure: Secondary | ICD-10-CM | POA: Diagnosis not present

## 2019-04-16 DIAGNOSIS — Z950 Presence of cardiac pacemaker: Secondary | ICD-10-CM | POA: Diagnosis not present

## 2019-04-16 DIAGNOSIS — Z7401 Bed confinement status: Secondary | ICD-10-CM | POA: Diagnosis not present

## 2019-04-16 DIAGNOSIS — Z452 Encounter for adjustment and management of vascular access device: Secondary | ICD-10-CM | POA: Diagnosis not present

## 2019-04-16 DIAGNOSIS — I82C11 Acute embolism and thrombosis of right internal jugular vein: Secondary | ICD-10-CM | POA: Diagnosis not present

## 2019-04-16 DIAGNOSIS — I959 Hypotension, unspecified: Secondary | ICD-10-CM | POA: Diagnosis not present

## 2019-04-16 DIAGNOSIS — I442 Atrioventricular block, complete: Secondary | ICD-10-CM | POA: Diagnosis not present

## 2019-04-16 DIAGNOSIS — J90 Pleural effusion, not elsewhere classified: Secondary | ICD-10-CM | POA: Diagnosis not present

## 2019-04-16 DIAGNOSIS — D509 Iron deficiency anemia, unspecified: Secondary | ICD-10-CM | POA: Diagnosis not present

## 2019-04-16 DIAGNOSIS — R188 Other ascites: Secondary | ICD-10-CM | POA: Diagnosis not present

## 2019-04-16 DIAGNOSIS — R000 Tachycardia, unspecified: Secondary | ICD-10-CM | POA: Diagnosis not present

## 2019-04-16 DIAGNOSIS — R579 Shock, unspecified: Secondary | ICD-10-CM | POA: Diagnosis not present

## 2019-04-16 DIAGNOSIS — J9610 Chronic respiratory failure, unspecified whether with hypoxia or hypercapnia: Secondary | ICD-10-CM | POA: Diagnosis not present

## 2019-04-16 DIAGNOSIS — Z515 Encounter for palliative care: Secondary | ICD-10-CM | POA: Diagnosis not present

## 2019-04-16 DIAGNOSIS — Z931 Gastrostomy status: Secondary | ICD-10-CM | POA: Diagnosis not present

## 2019-04-16 DIAGNOSIS — Z992 Dependence on renal dialysis: Secondary | ICD-10-CM | POA: Diagnosis not present

## 2019-04-16 DIAGNOSIS — G9389 Other specified disorders of brain: Secondary | ICD-10-CM | POA: Diagnosis not present

## 2019-04-16 DIAGNOSIS — R079 Chest pain, unspecified: Secondary | ICD-10-CM | POA: Diagnosis not present

## 2019-04-16 DIAGNOSIS — D631 Anemia in chronic kidney disease: Secondary | ICD-10-CM | POA: Diagnosis not present

## 2019-04-16 DIAGNOSIS — E786 Lipoprotein deficiency: Secondary | ICD-10-CM | POA: Diagnosis not present

## 2019-04-16 DIAGNOSIS — E876 Hypokalemia: Secondary | ICD-10-CM | POA: Diagnosis not present

## 2019-04-16 DIAGNOSIS — I4819 Other persistent atrial fibrillation: Secondary | ICD-10-CM | POA: Diagnosis not present

## 2019-04-16 DIAGNOSIS — E1140 Type 2 diabetes mellitus with diabetic neuropathy, unspecified: Secondary | ICD-10-CM | POA: Diagnosis not present

## 2019-04-16 DIAGNOSIS — N2581 Secondary hyperparathyroidism of renal origin: Secondary | ICD-10-CM | POA: Diagnosis not present

## 2019-04-16 DIAGNOSIS — R0602 Shortness of breath: Secondary | ICD-10-CM | POA: Diagnosis not present

## 2019-04-16 DIAGNOSIS — R601 Generalized edema: Secondary | ICD-10-CM | POA: Diagnosis not present

## 2019-04-16 DIAGNOSIS — R4182 Altered mental status, unspecified: Secondary | ICD-10-CM | POA: Diagnosis not present

## 2019-04-16 DIAGNOSIS — I482 Chronic atrial fibrillation, unspecified: Secondary | ICD-10-CM | POA: Diagnosis not present

## 2019-04-16 DIAGNOSIS — E785 Hyperlipidemia, unspecified: Secondary | ICD-10-CM | POA: Diagnosis not present

## 2019-04-16 DIAGNOSIS — Z430 Encounter for attention to tracheostomy: Secondary | ICD-10-CM | POA: Diagnosis not present

## 2019-04-16 DIAGNOSIS — Z6841 Body Mass Index (BMI) 40.0 and over, adult: Secondary | ICD-10-CM | POA: Diagnosis not present

## 2019-04-16 DIAGNOSIS — I499 Cardiac arrhythmia, unspecified: Secondary | ICD-10-CM | POA: Diagnosis not present

## 2019-04-16 DIAGNOSIS — E1122 Type 2 diabetes mellitus with diabetic chronic kidney disease: Secondary | ICD-10-CM | POA: Diagnosis not present

## 2019-04-16 DIAGNOSIS — R918 Other nonspecific abnormal finding of lung field: Secondary | ICD-10-CM | POA: Diagnosis not present

## 2019-04-16 DIAGNOSIS — Z930 Tracheostomy status: Secondary | ICD-10-CM | POA: Diagnosis not present

## 2019-04-16 DIAGNOSIS — E278 Other specified disorders of adrenal gland: Secondary | ICD-10-CM | POA: Diagnosis not present

## 2019-04-16 DIAGNOSIS — I472 Ventricular tachycardia: Secondary | ICD-10-CM | POA: Diagnosis not present

## 2019-04-16 DIAGNOSIS — K219 Gastro-esophageal reflux disease without esophagitis: Secondary | ICD-10-CM | POA: Diagnosis not present

## 2019-04-16 DIAGNOSIS — R001 Bradycardia, unspecified: Secondary | ICD-10-CM | POA: Diagnosis not present

## 2019-04-16 DIAGNOSIS — I132 Hypertensive heart and chronic kidney disease with heart failure and with stage 5 chronic kidney disease, or end stage renal disease: Secondary | ICD-10-CM | POA: Diagnosis not present

## 2019-04-16 DIAGNOSIS — I4891 Unspecified atrial fibrillation: Secondary | ICD-10-CM | POA: Diagnosis not present

## 2019-04-16 DIAGNOSIS — I517 Cardiomegaly: Secondary | ICD-10-CM | POA: Diagnosis not present

## 2019-04-16 DIAGNOSIS — I120 Hypertensive chronic kidney disease with stage 5 chronic kidney disease or end stage renal disease: Secondary | ICD-10-CM | POA: Diagnosis not present

## 2019-04-16 DIAGNOSIS — I469 Cardiac arrest, cause unspecified: Secondary | ICD-10-CM | POA: Diagnosis not present

## 2019-04-16 DIAGNOSIS — J9600 Acute respiratory failure, unspecified whether with hypoxia or hypercapnia: Secondary | ICD-10-CM | POA: Diagnosis not present

## 2019-04-16 DIAGNOSIS — I4901 Ventricular fibrillation: Secondary | ICD-10-CM | POA: Diagnosis not present

## 2019-04-16 DIAGNOSIS — N186 End stage renal disease: Secondary | ICD-10-CM | POA: Diagnosis not present

## 2019-04-16 DIAGNOSIS — F339 Major depressive disorder, recurrent, unspecified: Secondary | ICD-10-CM | POA: Diagnosis not present

## 2019-04-16 DIAGNOSIS — J811 Chronic pulmonary edema: Secondary | ICD-10-CM | POA: Diagnosis not present

## 2019-04-16 DIAGNOSIS — R9431 Abnormal electrocardiogram [ECG] [EKG]: Secondary | ICD-10-CM | POA: Diagnosis not present

## 2019-04-16 DIAGNOSIS — R570 Cardiogenic shock: Secondary | ICD-10-CM | POA: Diagnosis not present

## 2019-04-16 DIAGNOSIS — R0989 Other specified symptoms and signs involving the circulatory and respiratory systems: Secondary | ICD-10-CM | POA: Diagnosis not present

## 2019-04-16 DIAGNOSIS — I10 Essential (primary) hypertension: Secondary | ICD-10-CM | POA: Diagnosis not present

## 2019-04-16 DIAGNOSIS — Z20828 Contact with and (suspected) exposure to other viral communicable diseases: Secondary | ICD-10-CM | POA: Diagnosis not present

## 2019-04-16 DIAGNOSIS — R7881 Bacteremia: Secondary | ICD-10-CM | POA: Diagnosis not present

## 2019-04-21 DIAGNOSIS — S31819A Unspecified open wound of right buttock, initial encounter: Secondary | ICD-10-CM | POA: Diagnosis not present

## 2019-04-21 DIAGNOSIS — R739 Hyperglycemia, unspecified: Secondary | ICD-10-CM | POA: Diagnosis not present

## 2019-04-21 DIAGNOSIS — D631 Anemia in chronic kidney disease: Secondary | ICD-10-CM | POA: Diagnosis not present

## 2019-04-21 DIAGNOSIS — F339 Major depressive disorder, recurrent, unspecified: Secondary | ICD-10-CM | POA: Diagnosis not present

## 2019-04-21 DIAGNOSIS — E1140 Type 2 diabetes mellitus with diabetic neuropathy, unspecified: Secondary | ICD-10-CM | POA: Diagnosis not present

## 2019-04-21 DIAGNOSIS — J9600 Acute respiratory failure, unspecified whether with hypoxia or hypercapnia: Secondary | ICD-10-CM | POA: Diagnosis not present

## 2019-04-21 DIAGNOSIS — N2581 Secondary hyperparathyroidism of renal origin: Secondary | ICD-10-CM | POA: Diagnosis not present

## 2019-04-21 DIAGNOSIS — Z7401 Bed confinement status: Secondary | ICD-10-CM | POA: Diagnosis not present

## 2019-04-21 DIAGNOSIS — I5030 Unspecified diastolic (congestive) heart failure: Secondary | ICD-10-CM | POA: Diagnosis not present

## 2019-04-21 DIAGNOSIS — I442 Atrioventricular block, complete: Secondary | ICD-10-CM | POA: Diagnosis not present

## 2019-04-21 DIAGNOSIS — Z930 Tracheostomy status: Secondary | ICD-10-CM | POA: Diagnosis not present

## 2019-04-21 DIAGNOSIS — N186 End stage renal disease: Secondary | ICD-10-CM | POA: Diagnosis not present

## 2019-04-21 DIAGNOSIS — Z992 Dependence on renal dialysis: Secondary | ICD-10-CM | POA: Diagnosis not present

## 2019-04-21 DIAGNOSIS — J9610 Chronic respiratory failure, unspecified whether with hypoxia or hypercapnia: Secondary | ICD-10-CM | POA: Diagnosis not present

## 2019-04-21 DIAGNOSIS — J069 Acute upper respiratory infection, unspecified: Secondary | ICD-10-CM | POA: Diagnosis not present

## 2019-04-21 DIAGNOSIS — D509 Iron deficiency anemia, unspecified: Secondary | ICD-10-CM | POA: Diagnosis not present

## 2019-04-21 DIAGNOSIS — E119 Type 2 diabetes mellitus without complications: Secondary | ICD-10-CM | POA: Diagnosis not present

## 2019-04-21 DIAGNOSIS — I4891 Unspecified atrial fibrillation: Secondary | ICD-10-CM | POA: Diagnosis not present

## 2019-04-21 DIAGNOSIS — K219 Gastro-esophageal reflux disease without esophagitis: Secondary | ICD-10-CM | POA: Diagnosis not present

## 2019-04-21 DIAGNOSIS — D649 Anemia, unspecified: Secondary | ICD-10-CM | POA: Diagnosis not present

## 2019-04-21 DIAGNOSIS — I10 Essential (primary) hypertension: Secondary | ICD-10-CM | POA: Diagnosis not present

## 2019-04-21 DIAGNOSIS — E785 Hyperlipidemia, unspecified: Secondary | ICD-10-CM | POA: Diagnosis not present

## 2019-04-22 DIAGNOSIS — J9610 Chronic respiratory failure, unspecified whether with hypoxia or hypercapnia: Secondary | ICD-10-CM | POA: Diagnosis not present

## 2019-04-22 DIAGNOSIS — I10 Essential (primary) hypertension: Secondary | ICD-10-CM | POA: Diagnosis not present

## 2019-04-22 DIAGNOSIS — E119 Type 2 diabetes mellitus without complications: Secondary | ICD-10-CM | POA: Diagnosis not present

## 2019-04-22 DIAGNOSIS — I5030 Unspecified diastolic (congestive) heart failure: Secondary | ICD-10-CM | POA: Diagnosis not present

## 2019-04-23 DIAGNOSIS — E785 Hyperlipidemia, unspecified: Secondary | ICD-10-CM | POA: Diagnosis not present

## 2019-04-23 DIAGNOSIS — D509 Iron deficiency anemia, unspecified: Secondary | ICD-10-CM | POA: Diagnosis not present

## 2019-04-23 DIAGNOSIS — N2581 Secondary hyperparathyroidism of renal origin: Secondary | ICD-10-CM | POA: Diagnosis not present

## 2019-04-23 DIAGNOSIS — N186 End stage renal disease: Secondary | ICD-10-CM | POA: Diagnosis not present

## 2019-04-23 DIAGNOSIS — Z992 Dependence on renal dialysis: Secondary | ICD-10-CM | POA: Diagnosis not present

## 2019-04-23 DIAGNOSIS — D631 Anemia in chronic kidney disease: Secondary | ICD-10-CM | POA: Diagnosis not present

## 2019-04-24 DIAGNOSIS — N186 End stage renal disease: Secondary | ICD-10-CM | POA: Diagnosis not present

## 2019-04-24 DIAGNOSIS — Z930 Tracheostomy status: Secondary | ICD-10-CM | POA: Diagnosis not present

## 2019-04-24 DIAGNOSIS — D631 Anemia in chronic kidney disease: Secondary | ICD-10-CM | POA: Diagnosis not present

## 2019-04-24 DIAGNOSIS — I10 Essential (primary) hypertension: Secondary | ICD-10-CM | POA: Diagnosis not present

## 2019-04-24 DIAGNOSIS — I5030 Unspecified diastolic (congestive) heart failure: Secondary | ICD-10-CM | POA: Diagnosis not present

## 2019-04-24 DIAGNOSIS — J9610 Chronic respiratory failure, unspecified whether with hypoxia or hypercapnia: Secondary | ICD-10-CM | POA: Diagnosis not present

## 2019-04-25 DIAGNOSIS — N186 End stage renal disease: Secondary | ICD-10-CM | POA: Diagnosis not present

## 2019-04-25 DIAGNOSIS — E785 Hyperlipidemia, unspecified: Secondary | ICD-10-CM | POA: Diagnosis not present

## 2019-04-25 DIAGNOSIS — N2581 Secondary hyperparathyroidism of renal origin: Secondary | ICD-10-CM | POA: Diagnosis not present

## 2019-04-25 DIAGNOSIS — D631 Anemia in chronic kidney disease: Secondary | ICD-10-CM | POA: Diagnosis not present

## 2019-04-25 DIAGNOSIS — Z992 Dependence on renal dialysis: Secondary | ICD-10-CM | POA: Diagnosis not present

## 2019-04-25 DIAGNOSIS — D509 Iron deficiency anemia, unspecified: Secondary | ICD-10-CM | POA: Diagnosis not present

## 2019-04-26 ENCOUNTER — Other Ambulatory Visit: Payer: Self-pay

## 2019-04-26 DIAGNOSIS — J9610 Chronic respiratory failure, unspecified whether with hypoxia or hypercapnia: Secondary | ICD-10-CM | POA: Diagnosis not present

## 2019-04-26 DIAGNOSIS — I10 Essential (primary) hypertension: Secondary | ICD-10-CM | POA: Diagnosis not present

## 2019-04-26 DIAGNOSIS — E119 Type 2 diabetes mellitus without complications: Secondary | ICD-10-CM | POA: Diagnosis not present

## 2019-04-26 DIAGNOSIS — I5030 Unspecified diastolic (congestive) heart failure: Secondary | ICD-10-CM | POA: Diagnosis not present

## 2019-04-26 NOTE — Patient Outreach (Signed)
[facility]) Care Management  04/26/2019  Kathleen [name] 08-Jun-1950 [id number]   Transition of Care Referral  Referral Date:04/12/2019 Referral Source:[employer] Discharge Report Date of Discharge:04/11/2019 Facility[facility] Insurance:[employer] Medicare and Medicaid   Multiple attempts to establish contact with patient without success. No response from letter mailed to patient. Case is being closed at this time.    Plan: RN CM will close case at this time.    Enzo Montgomery, RN,BSN,CCM [place] Management Telephonic Care Management Coordinator Direct Phone: [phone] Toll Free: [phone] Fax: [phone]

## 2019-04-27 DIAGNOSIS — N2581 Secondary hyperparathyroidism of renal origin: Secondary | ICD-10-CM | POA: Diagnosis not present

## 2019-04-27 DIAGNOSIS — D509 Iron deficiency anemia, unspecified: Secondary | ICD-10-CM | POA: Diagnosis not present

## 2019-04-27 DIAGNOSIS — N186 End stage renal disease: Secondary | ICD-10-CM | POA: Diagnosis not present

## 2019-04-27 DIAGNOSIS — D631 Anemia in chronic kidney disease: Secondary | ICD-10-CM | POA: Diagnosis not present

## 2019-04-27 DIAGNOSIS — Z992 Dependence on renal dialysis: Secondary | ICD-10-CM | POA: Diagnosis not present

## 2019-04-27 DIAGNOSIS — E785 Hyperlipidemia, unspecified: Secondary | ICD-10-CM | POA: Diagnosis not present

## 2019-04-28 DIAGNOSIS — K219 Gastro-esophageal reflux disease without esophagitis: Secondary | ICD-10-CM | POA: Diagnosis not present

## 2019-04-28 DIAGNOSIS — J9610 Chronic respiratory failure, unspecified whether with hypoxia or hypercapnia: Secondary | ICD-10-CM | POA: Diagnosis not present

## 2019-04-28 DIAGNOSIS — J069 Acute upper respiratory infection, unspecified: Secondary | ICD-10-CM | POA: Diagnosis not present

## 2019-04-28 DIAGNOSIS — E119 Type 2 diabetes mellitus without complications: Secondary | ICD-10-CM | POA: Diagnosis not present

## 2019-04-28 DIAGNOSIS — I442 Atrioventricular block, complete: Secondary | ICD-10-CM | POA: Diagnosis not present

## 2019-04-28 DIAGNOSIS — D509 Iron deficiency anemia, unspecified: Secondary | ICD-10-CM | POA: Diagnosis not present

## 2019-04-28 DIAGNOSIS — R739 Hyperglycemia, unspecified: Secondary | ICD-10-CM | POA: Diagnosis not present

## 2019-04-28 DIAGNOSIS — I4891 Unspecified atrial fibrillation: Secondary | ICD-10-CM | POA: Diagnosis not present

## 2019-04-28 DIAGNOSIS — E1140 Type 2 diabetes mellitus with diabetic neuropathy, unspecified: Secondary | ICD-10-CM | POA: Diagnosis not present

## 2019-04-28 DIAGNOSIS — N186 End stage renal disease: Secondary | ICD-10-CM | POA: Diagnosis not present

## 2019-04-28 DIAGNOSIS — S31819A Unspecified open wound of right buttock, initial encounter: Secondary | ICD-10-CM | POA: Diagnosis not present

## 2019-04-28 DIAGNOSIS — I10 Essential (primary) hypertension: Secondary | ICD-10-CM | POA: Diagnosis not present

## 2019-04-28 DIAGNOSIS — D631 Anemia in chronic kidney disease: Secondary | ICD-10-CM | POA: Diagnosis not present

## 2019-04-28 DIAGNOSIS — I5030 Unspecified diastolic (congestive) heart failure: Secondary | ICD-10-CM | POA: Diagnosis not present

## 2019-04-28 DIAGNOSIS — Z930 Tracheostomy status: Secondary | ICD-10-CM | POA: Diagnosis not present

## 2019-04-28 DIAGNOSIS — Z992 Dependence on renal dialysis: Secondary | ICD-10-CM | POA: Diagnosis not present

## 2019-04-28 DIAGNOSIS — N2581 Secondary hyperparathyroidism of renal origin: Secondary | ICD-10-CM | POA: Diagnosis not present

## 2019-04-28 DIAGNOSIS — D649 Anemia, unspecified: Secondary | ICD-10-CM | POA: Diagnosis not present

## 2019-04-28 DIAGNOSIS — F339 Major depressive disorder, recurrent, unspecified: Secondary | ICD-10-CM | POA: Diagnosis not present

## 2019-04-30 DIAGNOSIS — Z992 Dependence on renal dialysis: Secondary | ICD-10-CM | POA: Diagnosis not present

## 2019-04-30 DIAGNOSIS — N186 End stage renal disease: Secondary | ICD-10-CM | POA: Diagnosis not present

## 2019-04-30 DIAGNOSIS — D631 Anemia in chronic kidney disease: Secondary | ICD-10-CM | POA: Diagnosis not present

## 2019-04-30 DIAGNOSIS — D509 Iron deficiency anemia, unspecified: Secondary | ICD-10-CM | POA: Diagnosis not present

## 2019-04-30 DIAGNOSIS — N2581 Secondary hyperparathyroidism of renal origin: Secondary | ICD-10-CM | POA: Diagnosis not present

## 2019-05-01 DIAGNOSIS — J9610 Chronic respiratory failure, unspecified whether with hypoxia or hypercapnia: Secondary | ICD-10-CM | POA: Diagnosis not present

## 2019-05-03 DIAGNOSIS — N186 End stage renal disease: Secondary | ICD-10-CM | POA: Diagnosis not present

## 2019-05-03 DIAGNOSIS — N2581 Secondary hyperparathyroidism of renal origin: Secondary | ICD-10-CM | POA: Diagnosis not present

## 2019-05-03 DIAGNOSIS — D631 Anemia in chronic kidney disease: Secondary | ICD-10-CM | POA: Diagnosis not present

## 2019-05-03 DIAGNOSIS — D509 Iron deficiency anemia, unspecified: Secondary | ICD-10-CM | POA: Diagnosis not present

## 2019-05-03 DIAGNOSIS — Z992 Dependence on renal dialysis: Secondary | ICD-10-CM | POA: Diagnosis not present

## 2019-05-04 DIAGNOSIS — N186 End stage renal disease: Secondary | ICD-10-CM | POA: Diagnosis not present

## 2019-05-04 DIAGNOSIS — J9610 Chronic respiratory failure, unspecified whether with hypoxia or hypercapnia: Secondary | ICD-10-CM | POA: Diagnosis not present

## 2019-05-04 DIAGNOSIS — D631 Anemia in chronic kidney disease: Secondary | ICD-10-CM | POA: Diagnosis not present

## 2019-05-04 DIAGNOSIS — I5030 Unspecified diastolic (congestive) heart failure: Secondary | ICD-10-CM | POA: Diagnosis not present

## 2019-05-04 DIAGNOSIS — I10 Essential (primary) hypertension: Secondary | ICD-10-CM | POA: Diagnosis not present

## 2019-05-04 DIAGNOSIS — Z930 Tracheostomy status: Secondary | ICD-10-CM | POA: Diagnosis not present

## 2019-05-05 DIAGNOSIS — D631 Anemia in chronic kidney disease: Secondary | ICD-10-CM | POA: Diagnosis not present

## 2019-05-05 DIAGNOSIS — N2581 Secondary hyperparathyroidism of renal origin: Secondary | ICD-10-CM | POA: Diagnosis not present

## 2019-05-05 DIAGNOSIS — N186 End stage renal disease: Secondary | ICD-10-CM | POA: Diagnosis not present

## 2019-05-05 DIAGNOSIS — Z992 Dependence on renal dialysis: Secondary | ICD-10-CM | POA: Diagnosis not present

## 2019-05-05 DIAGNOSIS — D509 Iron deficiency anemia, unspecified: Secondary | ICD-10-CM | POA: Diagnosis not present

## 2019-05-08 DIAGNOSIS — N186 End stage renal disease: Secondary | ICD-10-CM | POA: Diagnosis not present

## 2019-05-08 DIAGNOSIS — D509 Iron deficiency anemia, unspecified: Secondary | ICD-10-CM | POA: Diagnosis not present

## 2019-05-08 DIAGNOSIS — D631 Anemia in chronic kidney disease: Secondary | ICD-10-CM | POA: Diagnosis not present

## 2019-05-08 DIAGNOSIS — Z992 Dependence on renal dialysis: Secondary | ICD-10-CM | POA: Diagnosis not present

## 2019-05-08 DIAGNOSIS — N2581 Secondary hyperparathyroidism of renal origin: Secondary | ICD-10-CM | POA: Diagnosis not present

## 2019-05-09 DIAGNOSIS — J9610 Chronic respiratory failure, unspecified whether with hypoxia or hypercapnia: Secondary | ICD-10-CM | POA: Diagnosis not present

## 2019-05-10 DIAGNOSIS — D509 Iron deficiency anemia, unspecified: Secondary | ICD-10-CM | POA: Diagnosis not present

## 2019-05-10 DIAGNOSIS — D631 Anemia in chronic kidney disease: Secondary | ICD-10-CM | POA: Diagnosis not present

## 2019-05-10 DIAGNOSIS — N2581 Secondary hyperparathyroidism of renal origin: Secondary | ICD-10-CM | POA: Diagnosis not present

## 2019-05-10 DIAGNOSIS — Z992 Dependence on renal dialysis: Secondary | ICD-10-CM | POA: Diagnosis not present

## 2019-05-10 DIAGNOSIS — N186 End stage renal disease: Secondary | ICD-10-CM | POA: Diagnosis not present

## 2019-05-11 DIAGNOSIS — D631 Anemia in chronic kidney disease: Secondary | ICD-10-CM | POA: Diagnosis not present

## 2019-05-11 DIAGNOSIS — I10 Essential (primary) hypertension: Secondary | ICD-10-CM | POA: Diagnosis not present

## 2019-05-11 DIAGNOSIS — Z930 Tracheostomy status: Secondary | ICD-10-CM | POA: Diagnosis not present

## 2019-05-11 DIAGNOSIS — N186 End stage renal disease: Secondary | ICD-10-CM | POA: Diagnosis not present

## 2019-05-11 DIAGNOSIS — I5030 Unspecified diastolic (congestive) heart failure: Secondary | ICD-10-CM | POA: Diagnosis not present

## 2019-05-11 DIAGNOSIS — J9610 Chronic respiratory failure, unspecified whether with hypoxia or hypercapnia: Secondary | ICD-10-CM | POA: Diagnosis not present

## 2019-05-12 DIAGNOSIS — N2581 Secondary hyperparathyroidism of renal origin: Secondary | ICD-10-CM | POA: Diagnosis not present

## 2019-05-12 DIAGNOSIS — Z992 Dependence on renal dialysis: Secondary | ICD-10-CM | POA: Diagnosis not present

## 2019-05-12 DIAGNOSIS — N186 End stage renal disease: Secondary | ICD-10-CM | POA: Diagnosis not present

## 2019-05-12 DIAGNOSIS — D631 Anemia in chronic kidney disease: Secondary | ICD-10-CM | POA: Diagnosis not present

## 2019-05-12 DIAGNOSIS — D509 Iron deficiency anemia, unspecified: Secondary | ICD-10-CM | POA: Diagnosis not present

## 2019-05-15 DIAGNOSIS — D509 Iron deficiency anemia, unspecified: Secondary | ICD-10-CM | POA: Diagnosis not present

## 2019-05-15 DIAGNOSIS — N186 End stage renal disease: Secondary | ICD-10-CM | POA: Diagnosis not present

## 2019-05-15 DIAGNOSIS — N2581 Secondary hyperparathyroidism of renal origin: Secondary | ICD-10-CM | POA: Diagnosis not present

## 2019-05-15 DIAGNOSIS — D631 Anemia in chronic kidney disease: Secondary | ICD-10-CM | POA: Diagnosis not present

## 2019-05-15 DIAGNOSIS — Z992 Dependence on renal dialysis: Secondary | ICD-10-CM | POA: Diagnosis not present

## 2019-05-17 DIAGNOSIS — Z992 Dependence on renal dialysis: Secondary | ICD-10-CM | POA: Diagnosis not present

## 2019-05-17 DIAGNOSIS — D509 Iron deficiency anemia, unspecified: Secondary | ICD-10-CM | POA: Diagnosis not present

## 2019-05-17 DIAGNOSIS — N2581 Secondary hyperparathyroidism of renal origin: Secondary | ICD-10-CM | POA: Diagnosis not present

## 2019-05-17 DIAGNOSIS — D631 Anemia in chronic kidney disease: Secondary | ICD-10-CM | POA: Diagnosis not present

## 2019-05-17 DIAGNOSIS — N186 End stage renal disease: Secondary | ICD-10-CM | POA: Diagnosis not present

## 2019-05-18 DIAGNOSIS — I10 Essential (primary) hypertension: Secondary | ICD-10-CM | POA: Diagnosis not present

## 2019-05-18 DIAGNOSIS — N186 End stage renal disease: Secondary | ICD-10-CM | POA: Diagnosis not present

## 2019-05-18 DIAGNOSIS — Z930 Tracheostomy status: Secondary | ICD-10-CM | POA: Diagnosis not present

## 2019-05-18 DIAGNOSIS — D631 Anemia in chronic kidney disease: Secondary | ICD-10-CM | POA: Diagnosis not present

## 2019-05-18 DIAGNOSIS — I5030 Unspecified diastolic (congestive) heart failure: Secondary | ICD-10-CM | POA: Diagnosis not present

## 2019-05-18 DIAGNOSIS — J9610 Chronic respiratory failure, unspecified whether with hypoxia or hypercapnia: Secondary | ICD-10-CM | POA: Diagnosis not present

## 2019-05-19 DIAGNOSIS — N2581 Secondary hyperparathyroidism of renal origin: Secondary | ICD-10-CM | POA: Diagnosis not present

## 2019-05-19 DIAGNOSIS — I10 Essential (primary) hypertension: Secondary | ICD-10-CM | POA: Diagnosis not present

## 2019-05-19 DIAGNOSIS — D509 Iron deficiency anemia, unspecified: Secondary | ICD-10-CM | POA: Diagnosis not present

## 2019-05-19 DIAGNOSIS — N186 End stage renal disease: Secondary | ICD-10-CM | POA: Diagnosis not present

## 2019-05-19 DIAGNOSIS — D631 Anemia in chronic kidney disease: Secondary | ICD-10-CM | POA: Diagnosis not present

## 2019-05-19 DIAGNOSIS — E119 Type 2 diabetes mellitus without complications: Secondary | ICD-10-CM | POA: Diagnosis not present

## 2019-05-19 DIAGNOSIS — I5030 Unspecified diastolic (congestive) heart failure: Secondary | ICD-10-CM | POA: Diagnosis not present

## 2019-05-19 DIAGNOSIS — J9610 Chronic respiratory failure, unspecified whether with hypoxia or hypercapnia: Secondary | ICD-10-CM | POA: Diagnosis not present

## 2019-05-19 DIAGNOSIS — Z992 Dependence on renal dialysis: Secondary | ICD-10-CM | POA: Diagnosis not present

## 2019-05-22 DIAGNOSIS — D631 Anemia in chronic kidney disease: Secondary | ICD-10-CM | POA: Diagnosis not present

## 2019-05-22 DIAGNOSIS — Z992 Dependence on renal dialysis: Secondary | ICD-10-CM | POA: Diagnosis not present

## 2019-05-22 DIAGNOSIS — D509 Iron deficiency anemia, unspecified: Secondary | ICD-10-CM | POA: Diagnosis not present

## 2019-05-22 DIAGNOSIS — N2581 Secondary hyperparathyroidism of renal origin: Secondary | ICD-10-CM | POA: Diagnosis not present

## 2019-05-22 DIAGNOSIS — N186 End stage renal disease: Secondary | ICD-10-CM | POA: Diagnosis not present

## 2019-05-24 DIAGNOSIS — D631 Anemia in chronic kidney disease: Secondary | ICD-10-CM | POA: Diagnosis not present

## 2019-05-24 DIAGNOSIS — N2581 Secondary hyperparathyroidism of renal origin: Secondary | ICD-10-CM | POA: Diagnosis not present

## 2019-05-24 DIAGNOSIS — Z992 Dependence on renal dialysis: Secondary | ICD-10-CM | POA: Diagnosis not present

## 2019-05-24 DIAGNOSIS — N186 End stage renal disease: Secondary | ICD-10-CM | POA: Diagnosis not present

## 2019-05-24 DIAGNOSIS — D509 Iron deficiency anemia, unspecified: Secondary | ICD-10-CM | POA: Diagnosis not present

## 2019-05-25 DIAGNOSIS — I5030 Unspecified diastolic (congestive) heart failure: Secondary | ICD-10-CM | POA: Diagnosis not present

## 2019-05-25 DIAGNOSIS — D631 Anemia in chronic kidney disease: Secondary | ICD-10-CM | POA: Diagnosis not present

## 2019-05-25 DIAGNOSIS — J9610 Chronic respiratory failure, unspecified whether with hypoxia or hypercapnia: Secondary | ICD-10-CM | POA: Diagnosis not present

## 2019-05-25 DIAGNOSIS — N186 End stage renal disease: Secondary | ICD-10-CM | POA: Diagnosis not present

## 2019-05-25 DIAGNOSIS — I10 Essential (primary) hypertension: Secondary | ICD-10-CM | POA: Diagnosis not present

## 2019-05-25 DIAGNOSIS — Z930 Tracheostomy status: Secondary | ICD-10-CM | POA: Diagnosis not present

## 2019-05-26 DIAGNOSIS — N186 End stage renal disease: Secondary | ICD-10-CM | POA: Diagnosis not present

## 2019-05-26 DIAGNOSIS — Z992 Dependence on renal dialysis: Secondary | ICD-10-CM | POA: Diagnosis not present

## 2019-05-26 DIAGNOSIS — D509 Iron deficiency anemia, unspecified: Secondary | ICD-10-CM | POA: Diagnosis not present

## 2019-05-26 DIAGNOSIS — D631 Anemia in chronic kidney disease: Secondary | ICD-10-CM | POA: Diagnosis not present

## 2019-05-26 DIAGNOSIS — N2581 Secondary hyperparathyroidism of renal origin: Secondary | ICD-10-CM | POA: Diagnosis not present

## 2019-05-29 DIAGNOSIS — N2581 Secondary hyperparathyroidism of renal origin: Secondary | ICD-10-CM | POA: Diagnosis not present

## 2019-05-29 DIAGNOSIS — Z992 Dependence on renal dialysis: Secondary | ICD-10-CM | POA: Diagnosis not present

## 2019-05-29 DIAGNOSIS — D631 Anemia in chronic kidney disease: Secondary | ICD-10-CM | POA: Diagnosis not present

## 2019-05-29 DIAGNOSIS — N186 End stage renal disease: Secondary | ICD-10-CM | POA: Diagnosis not present

## 2019-05-29 DIAGNOSIS — D509 Iron deficiency anemia, unspecified: Secondary | ICD-10-CM | POA: Diagnosis not present

## 2019-05-31 DIAGNOSIS — Z992 Dependence on renal dialysis: Secondary | ICD-10-CM | POA: Diagnosis not present

## 2019-05-31 DIAGNOSIS — I5030 Unspecified diastolic (congestive) heart failure: Secondary | ICD-10-CM | POA: Diagnosis not present

## 2019-05-31 DIAGNOSIS — N2581 Secondary hyperparathyroidism of renal origin: Secondary | ICD-10-CM | POA: Diagnosis not present

## 2019-05-31 DIAGNOSIS — J9610 Chronic respiratory failure, unspecified whether with hypoxia or hypercapnia: Secondary | ICD-10-CM | POA: Diagnosis not present

## 2019-05-31 DIAGNOSIS — N186 End stage renal disease: Secondary | ICD-10-CM | POA: Diagnosis not present

## 2019-05-31 DIAGNOSIS — E119 Type 2 diabetes mellitus without complications: Secondary | ICD-10-CM | POA: Diagnosis not present

## 2019-05-31 DIAGNOSIS — I10 Essential (primary) hypertension: Secondary | ICD-10-CM | POA: Diagnosis not present

## 2019-05-31 DIAGNOSIS — D631 Anemia in chronic kidney disease: Secondary | ICD-10-CM | POA: Diagnosis not present

## 2019-05-31 DIAGNOSIS — D509 Iron deficiency anemia, unspecified: Secondary | ICD-10-CM | POA: Diagnosis not present

## 2019-06-01 DIAGNOSIS — D631 Anemia in chronic kidney disease: Secondary | ICD-10-CM | POA: Diagnosis not present

## 2019-06-01 DIAGNOSIS — N186 End stage renal disease: Secondary | ICD-10-CM | POA: Diagnosis not present

## 2019-06-01 DIAGNOSIS — Z930 Tracheostomy status: Secondary | ICD-10-CM | POA: Diagnosis not present

## 2019-06-01 DIAGNOSIS — I10 Essential (primary) hypertension: Secondary | ICD-10-CM | POA: Diagnosis not present

## 2019-06-01 DIAGNOSIS — J9610 Chronic respiratory failure, unspecified whether with hypoxia or hypercapnia: Secondary | ICD-10-CM | POA: Diagnosis not present

## 2019-06-01 DIAGNOSIS — I5030 Unspecified diastolic (congestive) heart failure: Secondary | ICD-10-CM | POA: Diagnosis not present

## 2019-06-02 DIAGNOSIS — N186 End stage renal disease: Secondary | ICD-10-CM | POA: Diagnosis not present

## 2019-06-02 DIAGNOSIS — N2581 Secondary hyperparathyroidism of renal origin: Secondary | ICD-10-CM | POA: Diagnosis not present

## 2019-06-02 DIAGNOSIS — D509 Iron deficiency anemia, unspecified: Secondary | ICD-10-CM | POA: Diagnosis not present

## 2019-06-02 DIAGNOSIS — D631 Anemia in chronic kidney disease: Secondary | ICD-10-CM | POA: Diagnosis not present

## 2019-06-02 DIAGNOSIS — Z992 Dependence on renal dialysis: Secondary | ICD-10-CM | POA: Diagnosis not present

## 2019-06-05 DIAGNOSIS — D509 Iron deficiency anemia, unspecified: Secondary | ICD-10-CM | POA: Diagnosis not present

## 2019-06-05 DIAGNOSIS — D631 Anemia in chronic kidney disease: Secondary | ICD-10-CM | POA: Diagnosis not present

## 2019-06-05 DIAGNOSIS — N186 End stage renal disease: Secondary | ICD-10-CM | POA: Diagnosis not present

## 2019-06-05 DIAGNOSIS — N2581 Secondary hyperparathyroidism of renal origin: Secondary | ICD-10-CM | POA: Diagnosis not present

## 2019-06-05 DIAGNOSIS — Z992 Dependence on renal dialysis: Secondary | ICD-10-CM | POA: Diagnosis not present

## 2019-06-07 DIAGNOSIS — Z992 Dependence on renal dialysis: Secondary | ICD-10-CM | POA: Diagnosis not present

## 2019-06-07 DIAGNOSIS — N186 End stage renal disease: Secondary | ICD-10-CM | POA: Diagnosis not present

## 2019-06-07 DIAGNOSIS — S31819A Unspecified open wound of right buttock, initial encounter: Secondary | ICD-10-CM | POA: Diagnosis not present

## 2019-06-07 DIAGNOSIS — D509 Iron deficiency anemia, unspecified: Secondary | ICD-10-CM | POA: Diagnosis not present

## 2019-06-07 DIAGNOSIS — D631 Anemia in chronic kidney disease: Secondary | ICD-10-CM | POA: Diagnosis not present

## 2019-06-07 DIAGNOSIS — N2581 Secondary hyperparathyroidism of renal origin: Secondary | ICD-10-CM | POA: Diagnosis not present

## 2019-06-08 DIAGNOSIS — I10 Essential (primary) hypertension: Secondary | ICD-10-CM | POA: Diagnosis not present

## 2019-06-08 DIAGNOSIS — D631 Anemia in chronic kidney disease: Secondary | ICD-10-CM | POA: Diagnosis not present

## 2019-06-08 DIAGNOSIS — J9610 Chronic respiratory failure, unspecified whether with hypoxia or hypercapnia: Secondary | ICD-10-CM | POA: Diagnosis not present

## 2019-06-08 DIAGNOSIS — Z930 Tracheostomy status: Secondary | ICD-10-CM | POA: Diagnosis not present

## 2019-06-08 DIAGNOSIS — I5030 Unspecified diastolic (congestive) heart failure: Secondary | ICD-10-CM | POA: Diagnosis not present

## 2019-06-08 DIAGNOSIS — N186 End stage renal disease: Secondary | ICD-10-CM | POA: Diagnosis not present

## 2019-06-09 DIAGNOSIS — N2581 Secondary hyperparathyroidism of renal origin: Secondary | ICD-10-CM | POA: Diagnosis not present

## 2019-06-09 DIAGNOSIS — N186 End stage renal disease: Secondary | ICD-10-CM | POA: Diagnosis not present

## 2019-06-09 DIAGNOSIS — D509 Iron deficiency anemia, unspecified: Secondary | ICD-10-CM | POA: Diagnosis not present

## 2019-06-09 DIAGNOSIS — D631 Anemia in chronic kidney disease: Secondary | ICD-10-CM | POA: Diagnosis not present

## 2019-06-09 DIAGNOSIS — Z992 Dependence on renal dialysis: Secondary | ICD-10-CM | POA: Diagnosis not present

## 2019-06-26 DEATH — deceased

## 2021-01-18 IMAGING — CR CHEST - 2 VIEW
2 series · 2 of 2 positions shown · non-contrast
Comparison: 01/14/2015

CLINICAL DATA: Wheezing, shortness of breath

EXAM:
CHEST - 2 VIEW

[chest lat]
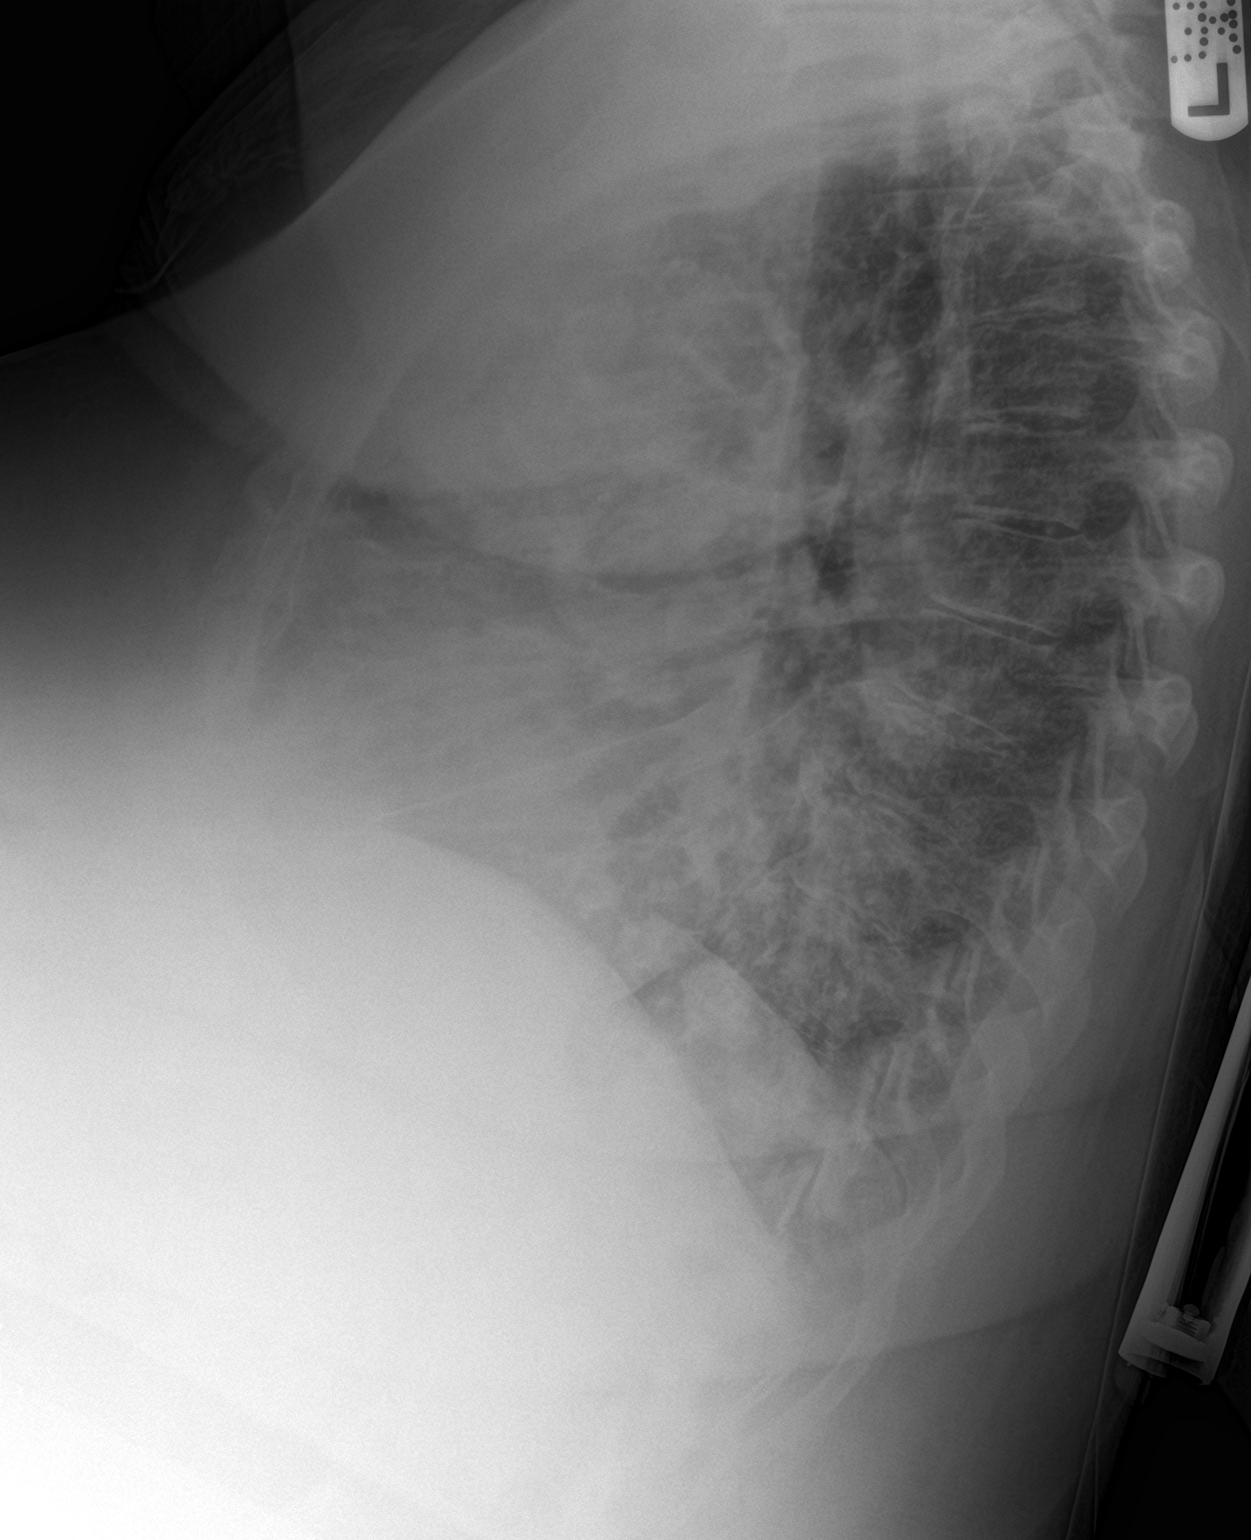

[chest ap]
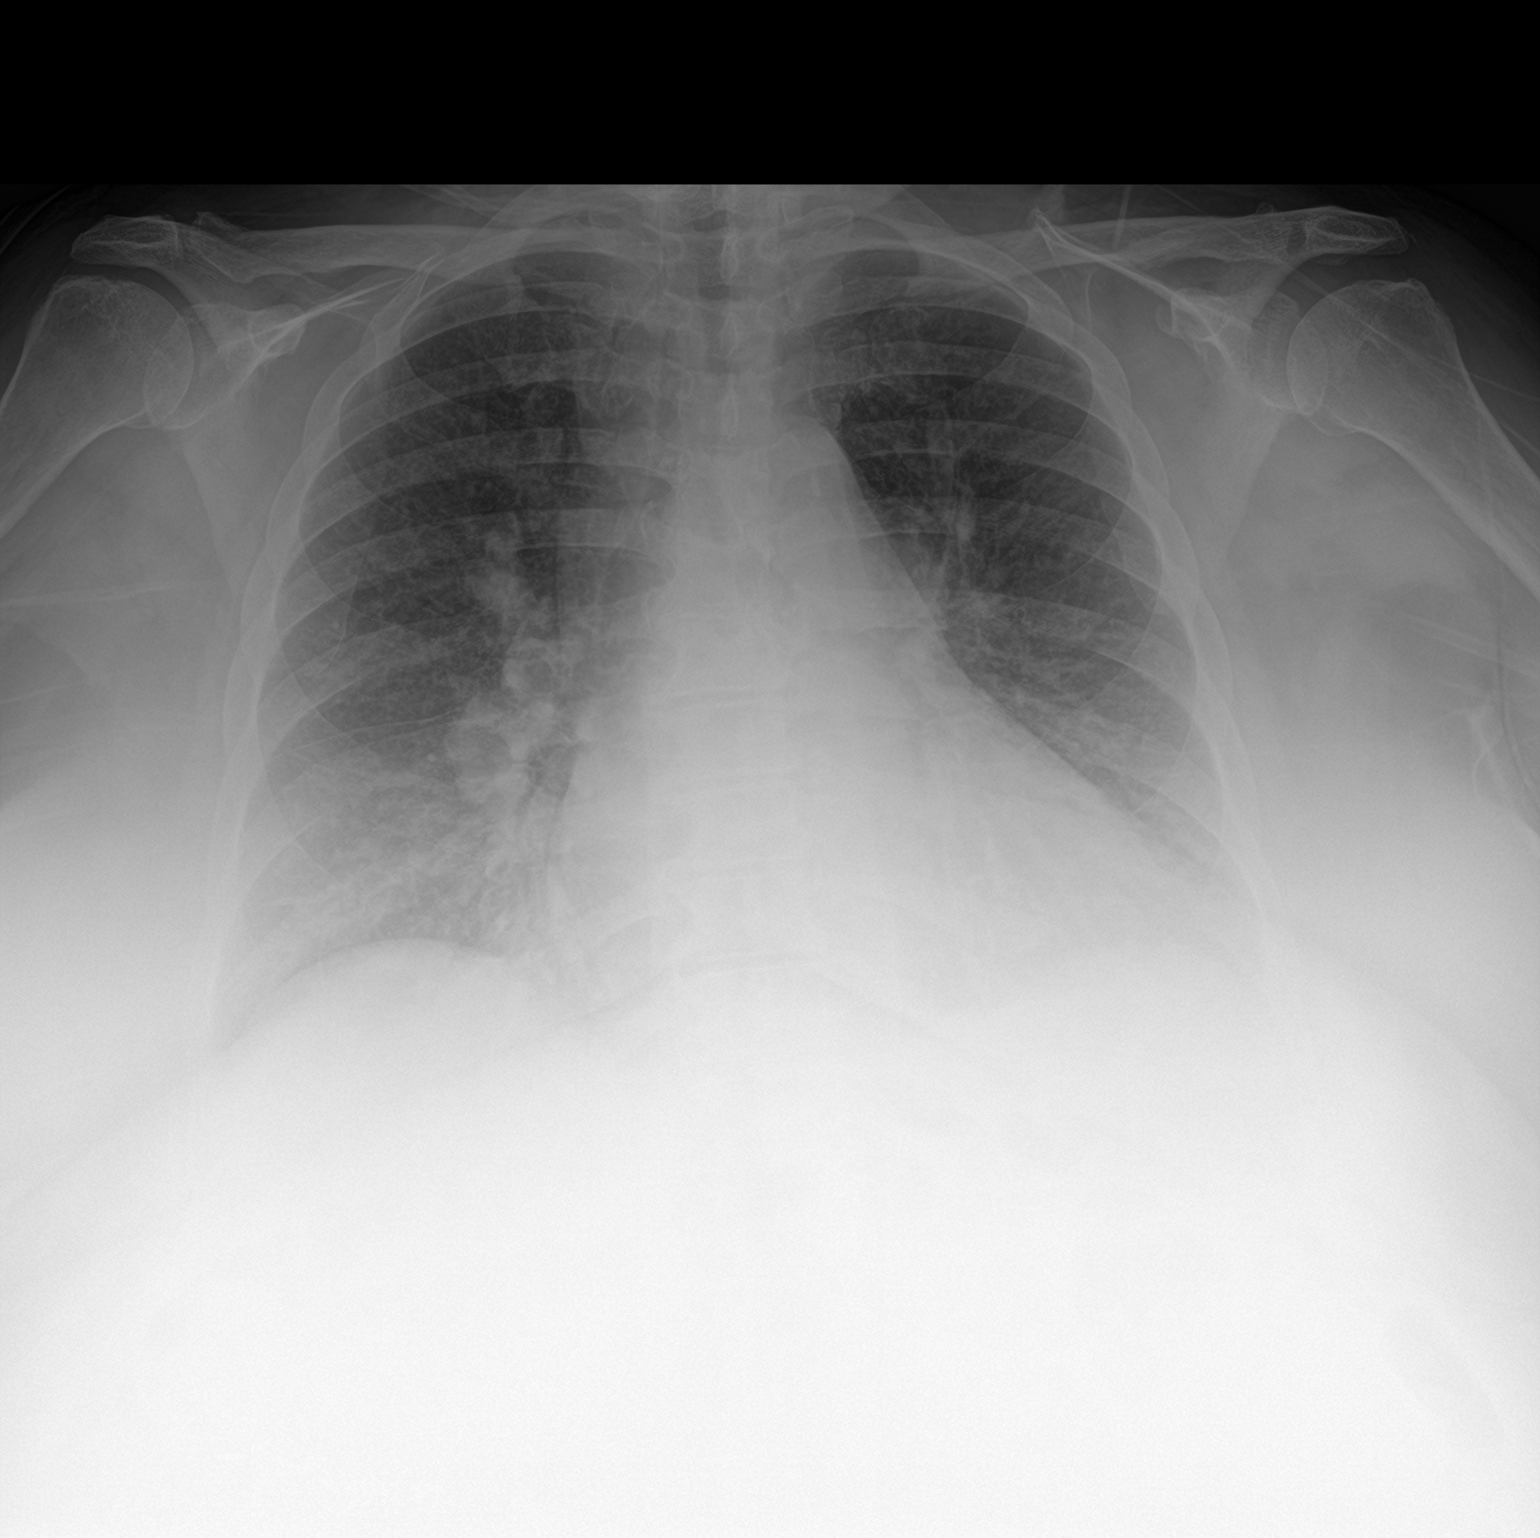

[2 of 2 positions shown; findings below may reference images not displayed]

FINDINGS: Bilateral interstitial thickening. No pleural effusion or
pneumothorax. Stable cardiomegaly. No acute osseous abnormality.
IMPRESSION: Mild CHF.

## 2021-02-14 IMAGING — DX PORTABLE CHEST - 1 VIEW
1 series · 1 of 1 positions shown · non-contrast
Comparison: 10/09/2018

CLINICAL DATA: ETT present,resp failure

EXAM:
PORTABLE CHEST - 1 VIEW

[chest]
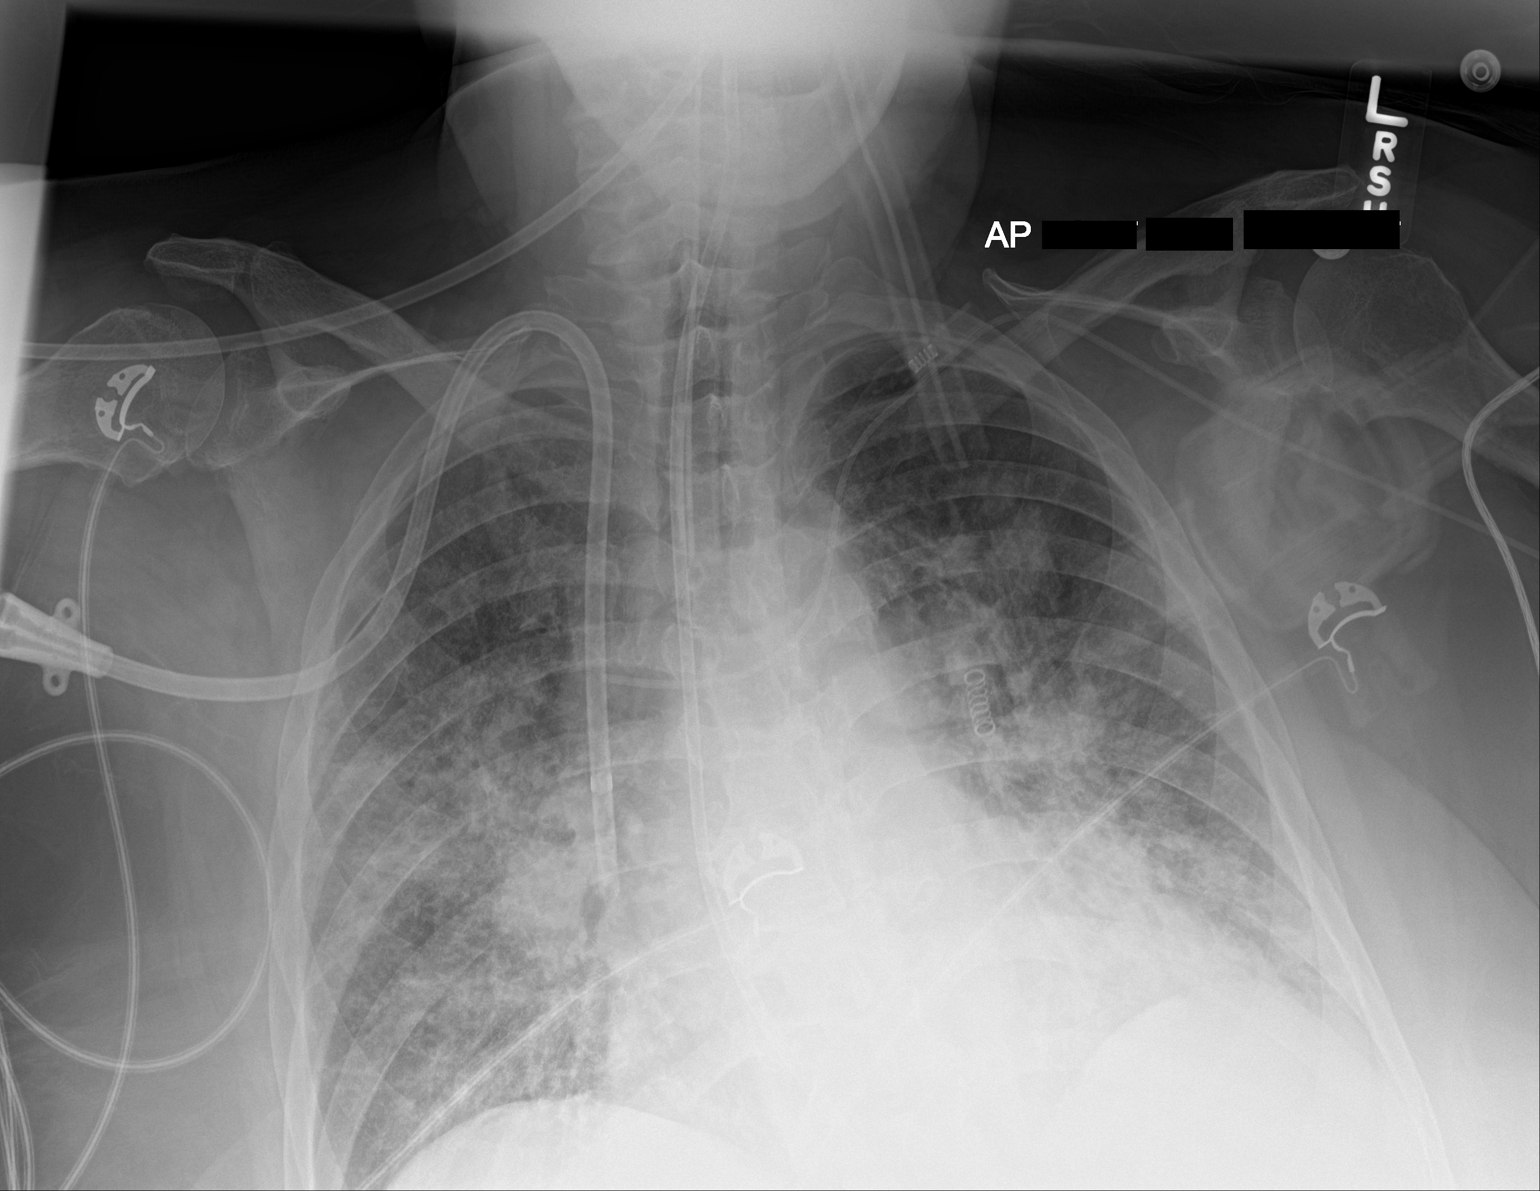

[1 of 1 positions shown; findings below may reference images not displayed]

FINDINGS: Endotracheal tube, feeding tube, left IJ central venous catheter,
and tunneled right IJ hemodialysis catheter stable in position.
Bilateral patchy interstitial and airspace opacities with a
predominantly perihilar and left lower lung distribution as before.

Heart size and mediastinal contours are within normal limits.

No effusion. No pneumothorax.

Visualized bones unremarkable.
IMPRESSION: 1. Stable asymmetric infiltrates or edema.
2. Support hardware stable in position.

## 2021-02-17 IMAGING — DX PORTABLE CHEST - 1 VIEW
1 series · 1 of 1 positions shown · non-contrast
Comparison: Chest x-rays dated 10/12/2018, 10/11/2018 and
10/09/2018.

CLINICAL DATA: ETT present ,resp failure

EXAM:
PORTABLE CHEST 1 VIEW

[chest ap]
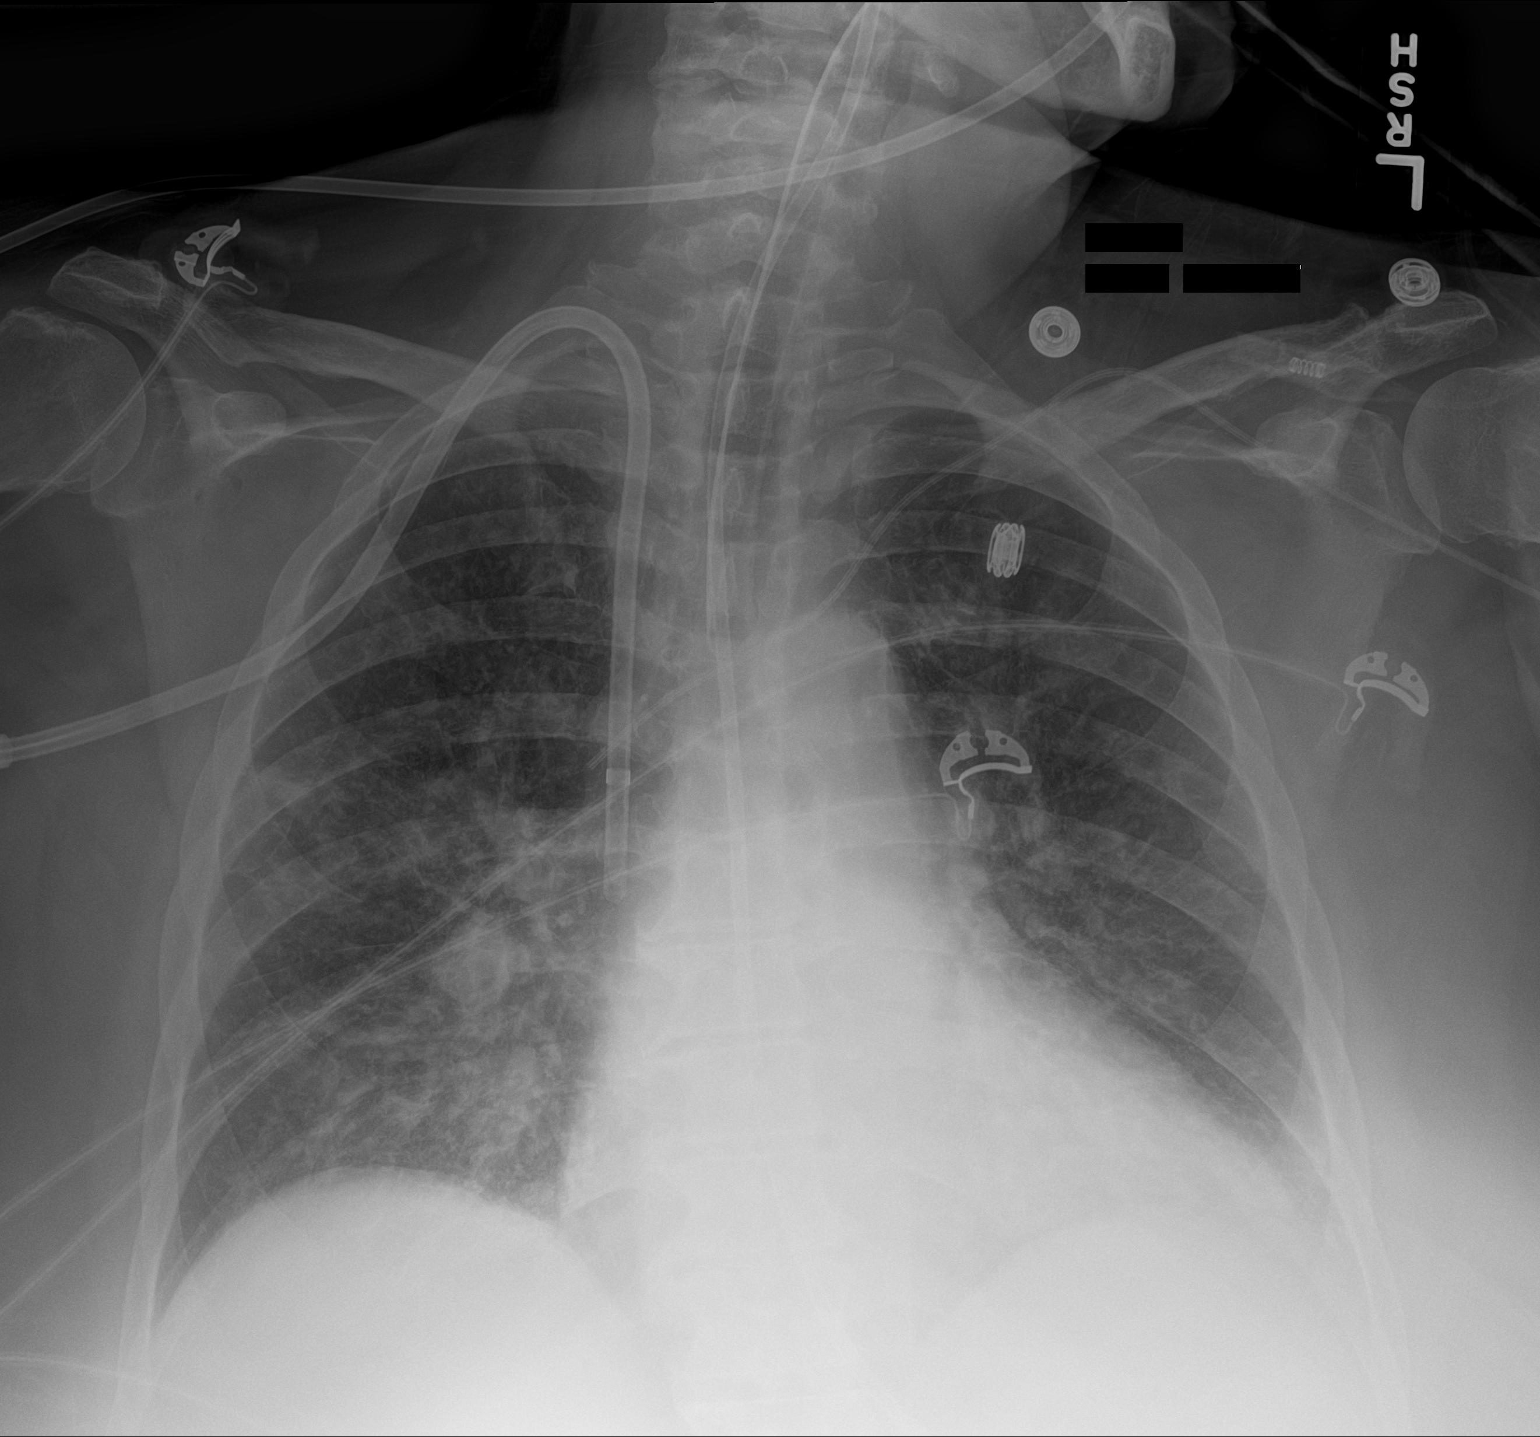

[1 of 1 positions shown; findings below may reference images not displayed]

FINDINGS: Mild cardiomegaly is stable. Overall cardiomediastinal silhouette is
stable. Endotracheal tube is well positioned with tip just above the
level of the carina. Enteric tube passes below the diaphragm. RIGHT
subclavian central line is stable in position with tip at the level
of the mid SVC.

Continued central pulmonary vascular congestion and mild bilateral
interstitial edema, unchanged compared to yesterday's exam, improved
compared to the earlier study of 10/09/2018. Probable small LEFT
pleural effusion. No pneumothorax seen.
IMPRESSION: 1. Endotracheal tube well positioned with tip just above the level
of the carina.
2. Continued central pulmonary vascular congestion and mild
bilateral interstitial edema, unchanged compared to yesterday's
exam, improved compared to yesterday's exam.
3. Probable small LEFT pleural effusion.

## 2021-02-18 IMAGING — DX PORTABLE CHEST - 1 VIEW
1 series · 1 of 1 positions shown · non-contrast
Comparison: 10/13/2018, 10/12/2018, 10/11/2018

CLINICAL DATA: 67-year-old female with tracheostomy tube placement

EXAM:
PORTABLE CHEST 1 VIEW

[chest ap]
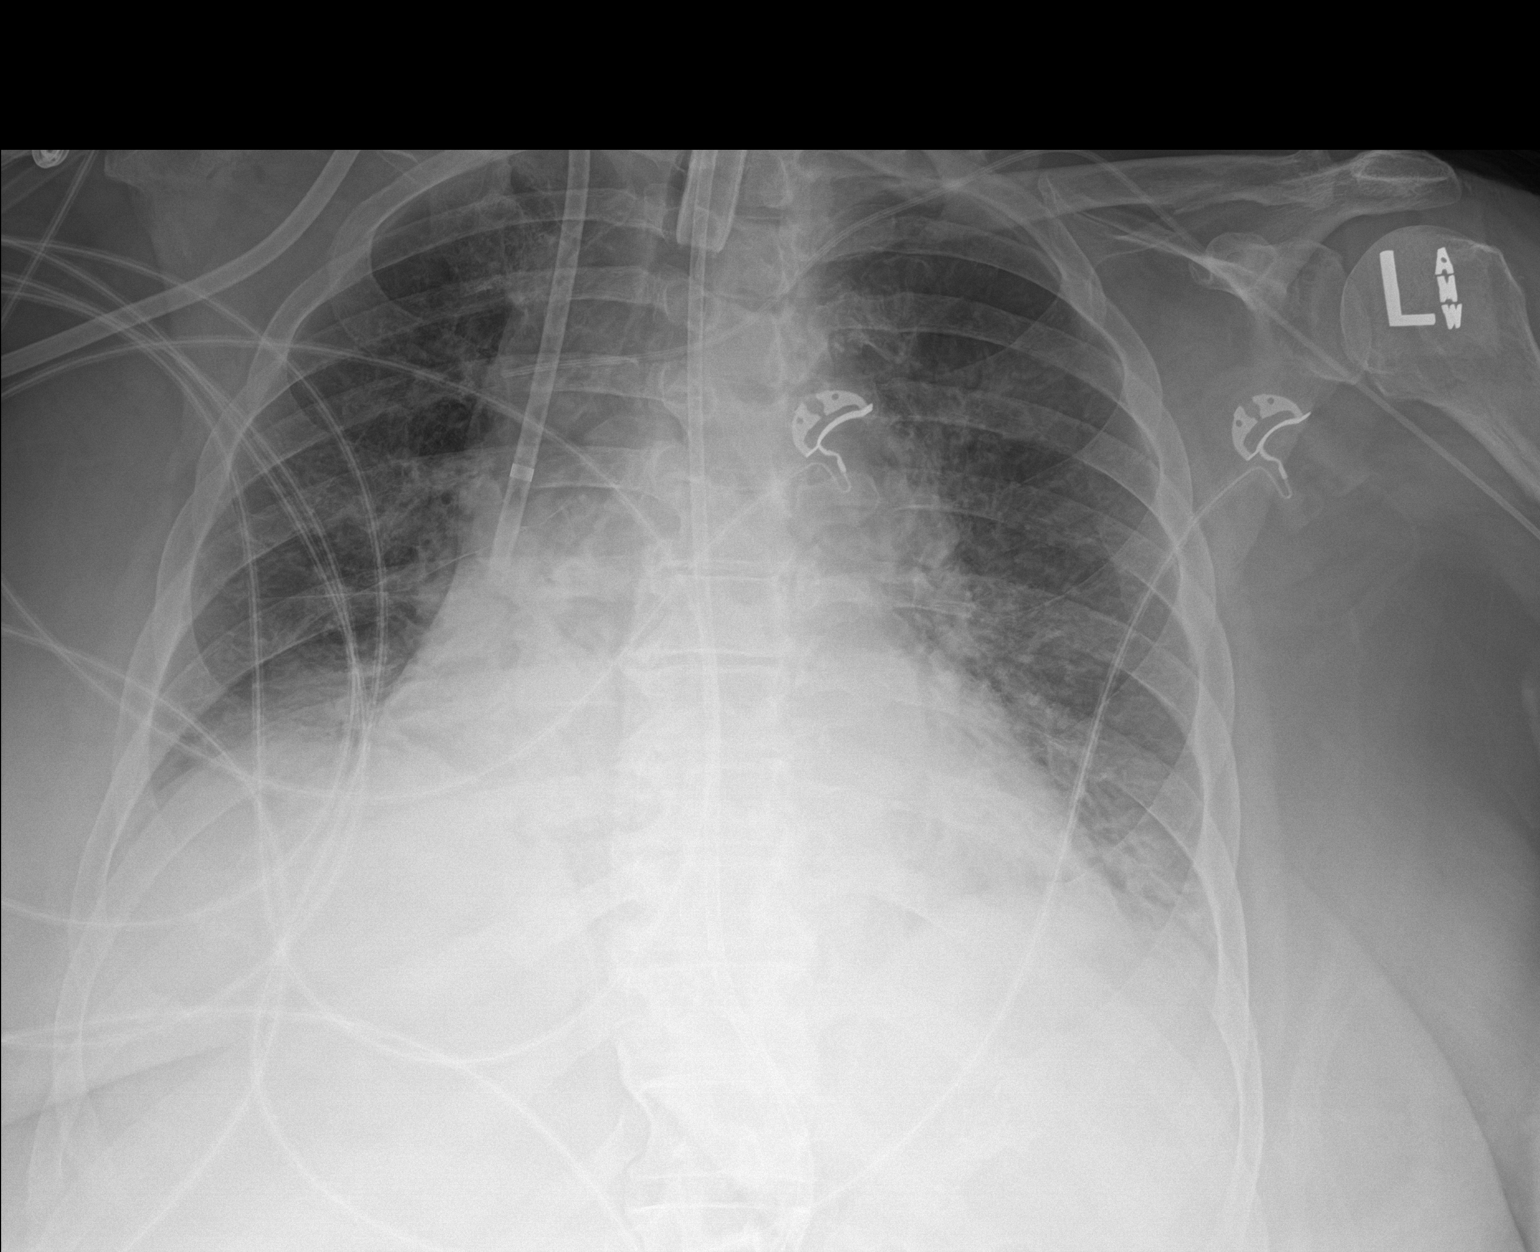

[1 of 1 positions shown; findings below may reference images not displayed]

FINDINGS: Cardiomediastinal silhouette unchanged in size and contour. Low lung
volumes accentuate the interstitium and the vasculature.

Interval removal of endotracheal tube in placement of tracheostomy
which terminates at the level of the clavicular heads in the
midline.

Unchanged position of left upper extremity PICC with the catheter
terminating in the superior vena cava.

Unchanged right IJ central venous catheter with the tip appearing to
terminate at the superior cavoatrial junction.

Enteric feeding tube terminates out of the field of view.

Reticular opacities at the lung bases with no interlobular septal
thickening. No pneumothorax.
IMPRESSION: Interval placement of tracheostomy tube and removal of endotracheal
tube.

Low lung volumes with likely atelectasis/consolidation.

Unchanged right IJ hemodialysis catheter, left upper extremity PICC,
and enteric feeding tube.
